# Patient Record
Sex: Male | Born: 1945 | Race: White | Hispanic: Refuse to answer | Marital: Married | Smoking: Former smoker
Health system: Southern US, Community
[De-identification: ages and names within clinical notes are randomized; demographics above are authoritative.]

## PROBLEM LIST (undated history)

## (undated) DIAGNOSIS — I700 Atherosclerosis of aorta: Secondary | ICD-10-CM

## (undated) DIAGNOSIS — M810 Age-related osteoporosis without current pathological fracture: Secondary | ICD-10-CM

## (undated) DIAGNOSIS — R7989 Other specified abnormal findings of blood chemistry: Secondary | ICD-10-CM

## (undated) DIAGNOSIS — R0609 Other forms of dyspnea: Secondary | ICD-10-CM

## (undated) DIAGNOSIS — Z7901 Long term (current) use of anticoagulants: Secondary | ICD-10-CM

## (undated) DIAGNOSIS — Z8679 Personal history of other diseases of the circulatory system: Secondary | ICD-10-CM

## (undated) DIAGNOSIS — R739 Hyperglycemia, unspecified: Secondary | ICD-10-CM

## (undated) DIAGNOSIS — Z8719 Personal history of other diseases of the digestive system: Secondary | ICD-10-CM

## (undated) DIAGNOSIS — I10 Essential (primary) hypertension: Secondary | ICD-10-CM

## (undated) DIAGNOSIS — F419 Anxiety disorder, unspecified: Secondary | ICD-10-CM

## (undated) DIAGNOSIS — E559 Vitamin D deficiency, unspecified: Secondary | ICD-10-CM

## (undated) DIAGNOSIS — H905 Unspecified sensorineural hearing loss: Secondary | ICD-10-CM

## (undated) DIAGNOSIS — Z87898 Personal history of other specified conditions: Secondary | ICD-10-CM

## (undated) DIAGNOSIS — L309 Dermatitis, unspecified: Secondary | ICD-10-CM

## (undated) DIAGNOSIS — J439 Emphysema, unspecified: Secondary | ICD-10-CM

## (undated) DIAGNOSIS — N183 Chronic kidney disease, stage 3 unspecified: Secondary | ICD-10-CM

## (undated) DIAGNOSIS — H3530 Unspecified macular degeneration: Secondary | ICD-10-CM

## (undated) DIAGNOSIS — Z789 Other specified health status: Secondary | ICD-10-CM

## (undated) DIAGNOSIS — D649 Anemia, unspecified: Secondary | ICD-10-CM

## (undated) DIAGNOSIS — I2510 Atherosclerotic heart disease of native coronary artery without angina pectoris: Secondary | ICD-10-CM

## (undated) DIAGNOSIS — M48062 Spinal stenosis, lumbar region with neurogenic claudication: Secondary | ICD-10-CM

## (undated) DIAGNOSIS — K52832 Lymphocytic colitis: Secondary | ICD-10-CM

## (undated) DIAGNOSIS — T7840XA Allergy, unspecified, initial encounter: Secondary | ICD-10-CM

## (undated) DIAGNOSIS — Z87442 Personal history of urinary calculi: Secondary | ICD-10-CM

## (undated) DIAGNOSIS — H5462 Unqualified visual loss, left eye, normal vision right eye: Secondary | ICD-10-CM

## (undated) DIAGNOSIS — R011 Cardiac murmur, unspecified: Secondary | ICD-10-CM

## (undated) DIAGNOSIS — J449 Chronic obstructive pulmonary disease, unspecified: Secondary | ICD-10-CM

## (undated) DIAGNOSIS — Z9889 Other specified postprocedural states: Secondary | ICD-10-CM

## (undated) DIAGNOSIS — N200 Calculus of kidney: Secondary | ICD-10-CM

## (undated) DIAGNOSIS — K649 Unspecified hemorrhoids: Secondary | ICD-10-CM

## (undated) DIAGNOSIS — N401 Enlarged prostate with lower urinary tract symptoms: Secondary | ICD-10-CM

## (undated) DIAGNOSIS — Z8489 Family history of other specified conditions: Secondary | ICD-10-CM

## (undated) DIAGNOSIS — M5030 Other cervical disc degeneration, unspecified cervical region: Secondary | ICD-10-CM

## (undated) DIAGNOSIS — R609 Edema, unspecified: Secondary | ICD-10-CM

## (undated) DIAGNOSIS — Z79899 Other long term (current) drug therapy: Secondary | ICD-10-CM

## (undated) DIAGNOSIS — I4510 Unspecified right bundle-branch block: Secondary | ICD-10-CM

## (undated) DIAGNOSIS — I4891 Unspecified atrial fibrillation: Secondary | ICD-10-CM

## (undated) DIAGNOSIS — H409 Unspecified glaucoma: Secondary | ICD-10-CM

## (undated) DIAGNOSIS — E785 Hyperlipidemia, unspecified: Secondary | ICD-10-CM

## (undated) DIAGNOSIS — I509 Heart failure, unspecified: Secondary | ICD-10-CM

## (undated) DIAGNOSIS — J181 Lobar pneumonia, unspecified organism: Secondary | ICD-10-CM

## (undated) DIAGNOSIS — Z7982 Long term (current) use of aspirin: Secondary | ICD-10-CM

## (undated) DIAGNOSIS — Z860101 Personal history of adenomatous and serrated colon polyps: Secondary | ICD-10-CM

## (undated) DIAGNOSIS — N369 Urethral disorder, unspecified: Secondary | ICD-10-CM

## (undated) DIAGNOSIS — N138 Other obstructive and reflux uropathy: Secondary | ICD-10-CM

## (undated) DIAGNOSIS — H269 Unspecified cataract: Secondary | ICD-10-CM

## (undated) DIAGNOSIS — M5416 Radiculopathy, lumbar region: Secondary | ICD-10-CM

## (undated) DIAGNOSIS — N182 Chronic kidney disease, stage 2 (mild): Secondary | ICD-10-CM

## (undated) DIAGNOSIS — K219 Gastro-esophageal reflux disease without esophagitis: Secondary | ICD-10-CM

## (undated) DIAGNOSIS — Z86010 Personal history of colonic polyps: Secondary | ICD-10-CM

## (undated) DIAGNOSIS — I4892 Unspecified atrial flutter: Principal | ICD-10-CM

## (undated) HISTORY — PX: CERVICAL DISCECTOMY: SHX98

## (undated) HISTORY — DX: Other specified postprocedural states: Z98.890

## (undated) HISTORY — DX: Essential (primary) hypertension: I10

## (undated) HISTORY — DX: Calculus of kidney: N20.0

## (undated) HISTORY — DX: Age-related osteoporosis without current pathological fracture: M81.0

## (undated) HISTORY — DX: Chronic obstructive pulmonary disease, unspecified: J44.9

## (undated) HISTORY — PX: FOOT SURGERY: SHX648

## (undated) HISTORY — PX: KNEE ARTHROSCOPY: SHX127

## (undated) HISTORY — DX: Gastro-esophageal reflux disease without esophagitis: K21.9

## (undated) HISTORY — DX: Unspecified cataract: H26.9

## (undated) HISTORY — PX: APPENDECTOMY: SHX54

## (undated) HISTORY — DX: Anemia, unspecified: D64.9

## (undated) HISTORY — DX: Emphysema, unspecified: J43.9

## (undated) HISTORY — DX: Hyperglycemia, unspecified: R73.9

## (undated) HISTORY — PX: MOUTH SURGERY: SHX715

## (undated) HISTORY — DX: Other long term (current) drug therapy: Z79.899

## (undated) HISTORY — DX: Hyperlipidemia, unspecified: E78.5

## (undated) HISTORY — DX: Unspecified macular degeneration: H35.30

## (undated) HISTORY — PX: JOINT REPLACEMENT: SHX530

## (undated) HISTORY — DX: Allergy, unspecified, initial encounter: T78.40XA

## (undated) HISTORY — DX: Cardiac murmur, unspecified: R01.1

## (undated) HISTORY — DX: Long term (current) use of anticoagulants: Z79.01

## (undated) HISTORY — PX: EYE SURGERY: SHX253

## (undated) HISTORY — DX: Atherosclerotic heart disease of native coronary artery without angina pectoris: I25.10

## (undated) HISTORY — PX: EXTRACORPOREAL SHOCK WAVE LITHOTRIPSY: SHX1557

## (undated) HISTORY — PX: CHOLECYSTECTOMY: SHX55

## (undated) HISTORY — DX: Unspecified atrial flutter: I48.92

## (undated) HISTORY — DX: Personal history of other diseases of the circulatory system: Z86.79

## (undated) HISTORY — DX: Other specified abnormal findings of blood chemistry: R79.89

## (undated) HISTORY — DX: Dermatitis, unspecified: L30.9

## (undated) HISTORY — PX: OTHER SURGICAL HISTORY: SHX169

---
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
## 1968-10-05 HISTORY — PX: FOOT SURGERY: SHX648

## 1989-10-05 HISTORY — PX: KNEE ARTHROSCOPY: SHX127

## 1998-10-05 HISTORY — PX: LAPAROSCOPIC CHOLECYSTECTOMY: SUR755

## 2002-02-04 ENCOUNTER — Emergency Department (HOSPITAL_COMMUNITY): Admission: EM | Admit: 2002-02-04 | Discharge: 2002-02-04 | Payer: Self-pay | Admitting: *Deleted

## 2002-02-14 ENCOUNTER — Encounter: Payer: Self-pay | Admitting: Internal Medicine

## 2002-02-14 ENCOUNTER — Ambulatory Visit (HOSPITAL_COMMUNITY): Admission: RE | Admit: 2002-02-14 | Discharge: 2002-02-14 | Payer: Self-pay | Admitting: Internal Medicine

## 2002-02-24 ENCOUNTER — Encounter: Admission: RE | Admit: 2002-02-24 | Discharge: 2002-04-06 | Payer: Self-pay | Admitting: Internal Medicine

## 2002-06-06 ENCOUNTER — Encounter: Payer: Self-pay | Admitting: Neurosurgery

## 2002-06-08 ENCOUNTER — Observation Stay (HOSPITAL_COMMUNITY): Admission: RE | Admit: 2002-06-08 | Discharge: 2002-06-09 | Payer: Self-pay | Admitting: Neurosurgery

## 2002-06-08 ENCOUNTER — Encounter: Payer: Self-pay | Admitting: Neurosurgery

## 2002-06-08 HISTORY — PX: ANTERIOR CERVICAL DECOMP/DISCECTOMY FUSION: SHX1161

## 2004-05-02 LAB — HM COLONOSCOPY

## 2004-09-25 ENCOUNTER — Ambulatory Visit: Payer: Self-pay | Admitting: Internal Medicine

## 2004-11-24 ENCOUNTER — Ambulatory Visit: Payer: Self-pay | Admitting: Internal Medicine

## 2004-12-03 ENCOUNTER — Ambulatory Visit: Payer: Self-pay | Admitting: Internal Medicine

## 2005-02-02 ENCOUNTER — Ambulatory Visit: Payer: Self-pay | Admitting: Internal Medicine

## 2005-02-09 ENCOUNTER — Ambulatory Visit: Payer: Self-pay | Admitting: Internal Medicine

## 2005-04-16 ENCOUNTER — Ambulatory Visit: Payer: Self-pay | Admitting: Internal Medicine

## 2005-04-23 ENCOUNTER — Ambulatory Visit: Payer: Self-pay | Admitting: Internal Medicine

## 2005-05-01 ENCOUNTER — Ambulatory Visit: Payer: Self-pay

## 2005-07-30 ENCOUNTER — Ambulatory Visit: Payer: Self-pay | Admitting: Internal Medicine

## 2005-08-04 ENCOUNTER — Ambulatory Visit: Admission: RE | Admit: 2005-08-04 | Discharge: 2005-08-04 | Payer: Self-pay | Admitting: Internal Medicine

## 2005-09-01 ENCOUNTER — Ambulatory Visit: Payer: Self-pay | Admitting: Internal Medicine

## 2005-09-17 ENCOUNTER — Ambulatory Visit: Payer: Self-pay | Admitting: Internal Medicine

## 2005-11-25 ENCOUNTER — Ambulatory Visit: Payer: Self-pay | Admitting: Internal Medicine

## 2005-12-16 ENCOUNTER — Ambulatory Visit: Payer: Self-pay | Admitting: Internal Medicine

## 2006-03-15 ENCOUNTER — Ambulatory Visit: Payer: Self-pay | Admitting: Internal Medicine

## 2006-03-22 ENCOUNTER — Ambulatory Visit: Payer: Self-pay | Admitting: Internal Medicine

## 2006-05-24 ENCOUNTER — Ambulatory Visit: Payer: Self-pay | Admitting: Internal Medicine

## 2006-05-25 ENCOUNTER — Ambulatory Visit: Payer: Self-pay | Admitting: Internal Medicine

## 2006-05-31 ENCOUNTER — Ambulatory Visit: Payer: Self-pay | Admitting: Internal Medicine

## 2006-09-06 ENCOUNTER — Ambulatory Visit: Payer: Self-pay | Admitting: Internal Medicine

## 2006-09-06 LAB — CONVERTED CEMR LAB
ALT: 23 units/L (ref 0–40)
AST: 27 units/L (ref 0–37)
Albumin: 3.9 g/dL (ref 3.5–5.2)
Alkaline Phosphatase: 73 units/L (ref 39–117)
Bilirubin, Direct: 0.1 mg/dL (ref 0.0–0.3)
Chol/HDL Ratio, serum: 4.8
Cholesterol: 209 mg/dL (ref 0–200)
HDL: 43.2 mg/dL (ref >39.0)
LDL DIRECT: 164 mg/dL
Total Bilirubin: 1.1 mg/dL (ref 0.3–1.2)
Total Protein: 6.8 g/dL (ref 6.0–8.3)
Triglyceride fasting, serum: 43 mg/dL (ref 0–149)
VLDL: 9 mg/dL (ref 0–40)

## 2006-09-20 ENCOUNTER — Ambulatory Visit: Payer: Self-pay | Admitting: Internal Medicine

## 2006-11-09 ENCOUNTER — Ambulatory Visit: Payer: Self-pay | Admitting: Internal Medicine

## 2006-11-09 LAB — CONVERTED CEMR LAB
ALT: 23 units/L (ref 0–40)
AST: 23 units/L (ref 0–37)
Albumin: 4 g/dL (ref 3.5–5.2)
Alkaline Phosphatase: 64 units/L (ref 39–117)
Bilirubin, Direct: 0.2 mg/dL (ref 0.0–0.3)
Cholesterol: 198 mg/dL (ref 0–200)
HDL: 36 mg/dL — ABNORMAL LOW (ref >39.0)
LDL Cholesterol: 148 mg/dL — ABNORMAL HIGH (ref 0–99)
Total Bilirubin: 0.8 mg/dL (ref 0.3–1.2)
Total CHOL/HDL Ratio: 5.5
Total Protein: 7.1 g/dL (ref 6.0–8.3)
Triglycerides: 71 mg/dL (ref 0–149)
VLDL: 14 mg/dL (ref 0–40)

## 2006-11-17 ENCOUNTER — Ambulatory Visit: Payer: Self-pay | Admitting: Internal Medicine

## 2006-11-17 LAB — CONVERTED CEMR LAB
Anti Nuclear Antibody(ANA): NEGATIVE
CRP, High Sensitivity: 4 (ref 0.00–5.00)
Cyclic Citrullin Peptide Ab: 2 units (ref <20)
Total CK: 82 units/L (ref 7–195)

## 2006-12-22 ENCOUNTER — Ambulatory Visit: Payer: Self-pay | Admitting: Internal Medicine

## 2007-02-02 ENCOUNTER — Ambulatory Visit: Payer: Self-pay | Admitting: Internal Medicine

## 2007-02-23 ENCOUNTER — Ambulatory Visit: Payer: Self-pay | Admitting: Internal Medicine

## 2007-02-26 ENCOUNTER — Encounter: Payer: Self-pay | Admitting: Internal Medicine

## 2007-02-26 ENCOUNTER — Ambulatory Visit: Payer: Self-pay | Admitting: Internal Medicine

## 2007-03-10 ENCOUNTER — Ambulatory Visit: Payer: Self-pay | Admitting: Internal Medicine

## 2007-03-25 ENCOUNTER — Encounter: Payer: Self-pay | Admitting: Internal Medicine

## 2007-06-15 ENCOUNTER — Encounter: Payer: Self-pay | Admitting: Internal Medicine

## 2007-06-16 DIAGNOSIS — K219 Gastro-esophageal reflux disease without esophagitis: Secondary | ICD-10-CM | POA: Insufficient documentation

## 2007-06-20 ENCOUNTER — Ambulatory Visit: Payer: Self-pay | Admitting: Internal Medicine

## 2007-06-20 DIAGNOSIS — M79609 Pain in unspecified limb: Secondary | ICD-10-CM | POA: Insufficient documentation

## 2007-06-20 DIAGNOSIS — N400 Enlarged prostate without lower urinary tract symptoms: Secondary | ICD-10-CM | POA: Insufficient documentation

## 2007-06-20 DIAGNOSIS — E782 Mixed hyperlipidemia: Secondary | ICD-10-CM | POA: Insufficient documentation

## 2007-06-20 DIAGNOSIS — H3530 Unspecified macular degeneration: Secondary | ICD-10-CM | POA: Insufficient documentation

## 2007-06-20 DIAGNOSIS — I10 Essential (primary) hypertension: Secondary | ICD-10-CM | POA: Insufficient documentation

## 2007-06-20 LAB — CONVERTED CEMR LAB
Cholesterol, target level: 200 mg/dL
Cholesterol: 248 mg/dL (ref 0–200)
Direct LDL: 202.5 mg/dL
HDL goal, serum: 40 mg/dL
HDL: 33.6 mg/dL — ABNORMAL LOW (ref >39.0)
LDL Goal: 100 mg/dL
PSA: 0.69 ng/mL (ref 0.10–4.00)
Total CHOL/HDL Ratio: 7.4
Triglycerides: 69 mg/dL (ref 0–149)
VLDL: 14 mg/dL (ref 0–40)

## 2007-09-20 ENCOUNTER — Ambulatory Visit: Payer: Self-pay | Admitting: Internal Medicine

## 2007-10-24 ENCOUNTER — Emergency Department (HOSPITAL_COMMUNITY): Admission: EM | Admit: 2007-10-24 | Discharge: 2007-10-24 | Payer: Self-pay | Admitting: Emergency Medicine

## 2007-11-07 ENCOUNTER — Ambulatory Visit: Payer: Self-pay | Admitting: Internal Medicine

## 2007-11-07 DIAGNOSIS — L218 Other seborrheic dermatitis: Secondary | ICD-10-CM | POA: Insufficient documentation

## 2007-11-07 DIAGNOSIS — S335XXA Sprain of ligaments of lumbar spine, initial encounter: Secondary | ICD-10-CM | POA: Insufficient documentation

## 2007-11-21 ENCOUNTER — Emergency Department (HOSPITAL_COMMUNITY): Admission: EM | Admit: 2007-11-21 | Discharge: 2007-11-21 | Payer: Self-pay | Admitting: Emergency Medicine

## 2007-11-22 ENCOUNTER — Telehealth: Payer: Self-pay | Admitting: Internal Medicine

## 2007-11-25 ENCOUNTER — Telehealth: Payer: Self-pay | Admitting: Internal Medicine

## 2007-11-28 ENCOUNTER — Telehealth (INDEPENDENT_AMBULATORY_CARE_PROVIDER_SITE_OTHER): Payer: Self-pay | Admitting: *Deleted

## 2007-11-30 ENCOUNTER — Encounter: Payer: Self-pay | Admitting: Internal Medicine

## 2007-12-02 ENCOUNTER — Encounter: Payer: Self-pay | Admitting: Internal Medicine

## 2008-03-05 ENCOUNTER — Ambulatory Visit: Payer: Self-pay | Admitting: Internal Medicine

## 2008-03-05 LAB — CONVERTED CEMR LAB
ALT: 20 units/L (ref 0–53)
AST: 23 units/L (ref 0–37)
Albumin: 3.8 g/dL (ref 3.5–5.2)
Alkaline Phosphatase: 64 units/L (ref 39–117)
BUN: 20 mg/dL (ref 6–23)
Basophils Absolute: 0 10*3/uL (ref 0.0–0.1)
Basophils Relative: 0.1 % (ref 0.0–1.0)
Bilirubin Urine: NEGATIVE
Bilirubin, Direct: 0.1 mg/dL (ref 0.0–0.3)
Blood in Urine, dipstick: NEGATIVE
CO2: 28 meq/L (ref 19–32)
Calcium: 9.5 mg/dL (ref 8.4–10.5)
Chloride: 104 meq/L (ref 96–112)
Cholesterol: 231 mg/dL (ref 0–200)
Creatinine, Ser: 1 mg/dL (ref 0.4–1.5)
Direct LDL: 178.7 mg/dL
Eosinophils Absolute: 0.1 10*3/uL (ref 0.0–0.7)
Eosinophils Relative: 0.8 % (ref 0.0–5.0)
GFR calc Af Amer: 98 mL/min
GFR calc non Af Amer: 81 mL/min
Glucose, Bld: 77 mg/dL (ref 70–99)
Glucose, Urine, Semiquant: NEGATIVE
HCT: 45.4 % (ref 39.0–52.0)
HDL: 34.3 mg/dL — ABNORMAL LOW (ref >39.0)
Hemoglobin: 16 g/dL (ref 13.0–17.0)
Lymphocytes Relative: 18.5 % (ref 12.0–46.0)
MCHC: 35.2 g/dL (ref 30.0–36.0)
MCV: 95 fL (ref 78.0–100.0)
Monocytes Absolute: 0.6 10*3/uL (ref 0.1–1.0)
Monocytes Relative: 5.6 % (ref 3.0–12.0)
Neutro Abs: 8 10*3/uL — ABNORMAL HIGH (ref 1.4–7.7)
Neutrophils Relative %: 75 % (ref 43.0–77.0)
Nitrite: NEGATIVE
PSA: 0.73 ng/mL (ref 0.10–4.00)
Platelets: 195 10*3/uL (ref 150–400)
Potassium: 5.1 meq/L (ref 3.5–5.1)
Protein, U semiquant: NEGATIVE
RBC: 4.78 M/uL (ref 4.22–5.81)
RDW: 12 % (ref 11.5–14.6)
Sodium: 138 meq/L (ref 135–145)
Specific Gravity, Urine: 1.025
TSH: 1.22 microintl units/mL (ref 0.35–5.50)
Total Bilirubin: 0.8 mg/dL (ref 0.3–1.2)
Total CHOL/HDL Ratio: 6.7
Total Protein: 7.1 g/dL (ref 6.0–8.3)
Triglycerides: 48 mg/dL (ref 0–149)
Urobilinogen, UA: 0.2
VLDL: 10 mg/dL (ref 0–40)
WBC Urine, dipstick: NEGATIVE
WBC: 10.7 10*3/uL — ABNORMAL HIGH (ref 4.5–10.5)
pH: 5

## 2008-03-12 ENCOUNTER — Ambulatory Visit: Payer: Self-pay | Admitting: Internal Medicine

## 2008-03-12 DIAGNOSIS — J320 Chronic maxillary sinusitis: Secondary | ICD-10-CM | POA: Insufficient documentation

## 2008-04-13 ENCOUNTER — Encounter: Payer: Self-pay | Admitting: Internal Medicine

## 2008-04-27 ENCOUNTER — Encounter: Payer: Self-pay | Admitting: Internal Medicine

## 2008-06-28 ENCOUNTER — Encounter: Payer: Self-pay | Admitting: Internal Medicine

## 2008-09-04 ENCOUNTER — Ambulatory Visit: Payer: Self-pay | Admitting: Internal Medicine

## 2008-09-04 LAB — CONVERTED CEMR LAB
Cholesterol: 248 mg/dL (ref 0–200)
Direct LDL: 175.1 mg/dL
HDL: 41 mg/dL (ref >39.0)
Total CHOL/HDL Ratio: 6
Triglycerides: 70 mg/dL (ref 0–149)
VLDL: 14 mg/dL (ref 0–40)

## 2009-03-15 ENCOUNTER — Ambulatory Visit: Payer: Self-pay | Admitting: Internal Medicine

## 2009-03-15 LAB — CONVERTED CEMR LAB
ALT: 19 units/L (ref 0–53)
AST: 21 units/L (ref 0–37)
Albumin: 3.8 g/dL (ref 3.5–5.2)
Alkaline Phosphatase: 72 units/L (ref 39–117)
BUN: 17 mg/dL (ref 6–23)
Basophils Absolute: 0 10*3/uL (ref 0.0–0.1)
Basophils Relative: 0.1 % (ref 0.0–3.0)
Bilirubin Urine: NEGATIVE
Bilirubin, Direct: 0 mg/dL (ref 0.0–0.3)
Blood in Urine, dipstick: NEGATIVE
CO2: 28 meq/L (ref 19–32)
Calcium: 9.3 mg/dL (ref 8.4–10.5)
Chloride: 106 meq/L (ref 96–112)
Cholesterol: 215 mg/dL — ABNORMAL HIGH (ref 0–200)
Creatinine, Ser: 0.9 mg/dL (ref 0.4–1.5)
Direct LDL: 168.5 mg/dL
Eosinophils Absolute: 0.1 10*3/uL (ref 0.0–0.7)
Eosinophils Relative: 1.2 % (ref 0.0–5.0)
GFR calc non Af Amer: 90.83 mL/min (ref >60)
Glucose, Bld: 81 mg/dL (ref 70–99)
Glucose, Urine, Semiquant: NEGATIVE
HCT: 43.8 % (ref 39.0–52.0)
HDL: 36.9 mg/dL — ABNORMAL LOW (ref >39.00)
Hemoglobin: 15.6 g/dL (ref 13.0–17.0)
Ketones, urine, test strip: NEGATIVE
Lymphocytes Relative: 22.5 % (ref 12.0–46.0)
Lymphs Abs: 1.8 10*3/uL (ref 0.7–4.0)
MCHC: 35.6 g/dL (ref 30.0–36.0)
MCV: 92.7 fL (ref 78.0–100.0)
Monocytes Absolute: 0.5 10*3/uL (ref 0.1–1.0)
Monocytes Relative: 5.8 % (ref 3.0–12.0)
Neutro Abs: 5.4 10*3/uL (ref 1.4–7.7)
Neutrophils Relative %: 70.4 % (ref 43.0–77.0)
Nitrite: NEGATIVE
PSA: 0.73 ng/mL (ref 0.10–4.00)
Platelets: 173 10*3/uL (ref 150.0–400.0)
Potassium: 4.6 meq/L (ref 3.5–5.1)
Protein, U semiquant: NEGATIVE
RBC: 4.73 M/uL (ref 4.22–5.81)
RDW: 12 % (ref 11.5–14.6)
Sodium: 137 meq/L (ref 135–145)
Specific Gravity, Urine: 1.01
TSH: 1.12 microintl units/mL (ref 0.35–5.50)
Total Bilirubin: 1.2 mg/dL (ref 0.3–1.2)
Total CHOL/HDL Ratio: 6
Total Protein: 7.4 g/dL (ref 6.0–8.3)
Triglycerides: 55 mg/dL (ref 0.0–149.0)
Urobilinogen, UA: 0.2
VLDL: 11 mg/dL (ref 0.0–40.0)
WBC Urine, dipstick: NEGATIVE
WBC: 7.8 10*3/uL (ref 4.5–10.5)
pH: 5.5

## 2009-03-27 ENCOUNTER — Ambulatory Visit: Payer: Self-pay | Admitting: Internal Medicine

## 2009-04-22 ENCOUNTER — Encounter: Payer: Self-pay | Admitting: Internal Medicine

## 2009-07-18 ENCOUNTER — Ambulatory Visit: Payer: Self-pay | Admitting: Internal Medicine

## 2009-07-18 LAB — CONVERTED CEMR LAB
ALT: 35 units/L (ref 0–53)
AST: 34 units/L (ref 0–37)
Albumin: 3.7 g/dL (ref 3.5–5.2)
Alkaline Phosphatase: 75 units/L (ref 39–117)
Bilirubin, Direct: 0.1 mg/dL (ref 0.0–0.3)
Cholesterol: 199 mg/dL (ref 0–200)
HDL: 27.9 mg/dL — ABNORMAL LOW (ref >39.00)
LDL Cholesterol: 154 mg/dL — ABNORMAL HIGH (ref 0–99)
Total Bilirubin: 0.9 mg/dL (ref 0.3–1.2)
Total CHOL/HDL Ratio: 7
Total Protein: 7.5 g/dL (ref 6.0–8.3)
Triglycerides: 84 mg/dL (ref 0.0–149.0)
VLDL: 16.8 mg/dL (ref 0.0–40.0)

## 2009-07-29 ENCOUNTER — Ambulatory Visit: Payer: Self-pay | Admitting: Internal Medicine

## 2009-07-29 DIAGNOSIS — J449 Chronic obstructive pulmonary disease, unspecified: Secondary | ICD-10-CM | POA: Insufficient documentation

## 2009-07-29 DIAGNOSIS — Z87891 Personal history of nicotine dependence: Secondary | ICD-10-CM | POA: Insufficient documentation

## 2009-08-14 ENCOUNTER — Inpatient Hospital Stay (HOSPITAL_COMMUNITY): Admission: RE | Admit: 2009-08-14 | Discharge: 2009-08-17 | Payer: Self-pay | Admitting: Orthopedic Surgery

## 2009-08-14 HISTORY — PX: TOTAL KNEE ARTHROPLASTY: SHX125

## 2009-11-07 ENCOUNTER — Encounter: Payer: Self-pay | Admitting: Internal Medicine

## 2009-11-11 ENCOUNTER — Ambulatory Visit: Payer: Self-pay | Admitting: Internal Medicine

## 2009-11-11 DIAGNOSIS — T887XXA Unspecified adverse effect of drug or medicament, initial encounter: Secondary | ICD-10-CM | POA: Insufficient documentation

## 2009-11-11 LAB — CONVERTED CEMR LAB
ALT: 17 units/L (ref 0–53)
AST: 18 units/L (ref 0–37)
Albumin: 3.9 g/dL (ref 3.5–5.2)
Alkaline Phosphatase: 70 units/L (ref 39–117)
Basophils Absolute: 0 10*3/uL (ref 0.0–0.1)
Basophils Relative: 0.2 % (ref 0.0–3.0)
Bilirubin, Direct: 0 mg/dL (ref 0.0–0.3)
Eosinophils Absolute: 0 10*3/uL (ref 0.0–0.7)
Eosinophils Relative: 0.5 % (ref 0.0–5.0)
HCT: 41.6 % (ref 39.0–52.0)
Hemoglobin: 13.8 g/dL (ref 13.0–17.0)
Lymphocytes Relative: 23.5 % (ref 12.0–46.0)
Lymphs Abs: 2 10*3/uL (ref 0.7–4.0)
MCHC: 33.2 g/dL (ref 30.0–36.0)
MCV: 93.7 fL (ref 78.0–100.0)
Monocytes Absolute: 0.5 10*3/uL (ref 0.1–1.0)
Monocytes Relative: 6 % (ref 3.0–12.0)
Neutro Abs: 6 10*3/uL (ref 1.4–7.7)
Neutrophils Relative %: 69.8 % (ref 43.0–77.0)
Platelets: 173 10*3/uL (ref 150.0–400.0)
RBC: 4.44 M/uL (ref 4.22–5.81)
RDW: 12.1 % (ref 11.5–14.6)
Total Bilirubin: 0.4 mg/dL (ref 0.3–1.2)
Total Protein: 7.5 g/dL (ref 6.0–8.3)
WBC: 8.5 10*3/uL (ref 4.5–10.5)

## 2009-11-13 ENCOUNTER — Telehealth: Payer: Self-pay | Admitting: Internal Medicine

## 2009-11-18 ENCOUNTER — Encounter: Payer: Self-pay | Admitting: Internal Medicine

## 2009-12-09 ENCOUNTER — Ambulatory Visit: Payer: Self-pay | Admitting: Internal Medicine

## 2010-01-06 ENCOUNTER — Ambulatory Visit: Payer: Self-pay | Admitting: Internal Medicine

## 2010-01-20 ENCOUNTER — Telehealth: Payer: Self-pay | Admitting: Internal Medicine

## 2010-01-30 ENCOUNTER — Encounter: Payer: Self-pay | Admitting: Internal Medicine

## 2010-02-14 ENCOUNTER — Encounter: Payer: Self-pay | Admitting: Internal Medicine

## 2010-03-10 ENCOUNTER — Ambulatory Visit: Payer: Self-pay | Admitting: Internal Medicine

## 2010-04-23 ENCOUNTER — Telehealth: Payer: Self-pay | Admitting: Internal Medicine

## 2010-05-21 ENCOUNTER — Telehealth: Payer: Self-pay | Admitting: Internal Medicine

## 2010-05-30 ENCOUNTER — Encounter: Payer: Self-pay | Admitting: Internal Medicine

## 2010-06-20 ENCOUNTER — Ambulatory Visit: Payer: Self-pay | Admitting: Internal Medicine

## 2010-06-20 DIAGNOSIS — F4321 Adjustment disorder with depressed mood: Secondary | ICD-10-CM | POA: Insufficient documentation

## 2010-06-20 DIAGNOSIS — F911 Conduct disorder, childhood-onset type: Secondary | ICD-10-CM | POA: Insufficient documentation

## 2010-06-20 LAB — CONVERTED CEMR LAB
CRP, High Sensitivity: 2.18 (ref 0.00–5.00)
Sed Rate: 13 mm/hr (ref 0–22)

## 2010-06-27 ENCOUNTER — Telehealth: Payer: Self-pay | Admitting: Internal Medicine

## 2010-06-30 ENCOUNTER — Emergency Department (HOSPITAL_COMMUNITY): Admission: EM | Admit: 2010-06-30 | Discharge: 2010-06-30 | Payer: Self-pay | Admitting: Emergency Medicine

## 2010-06-30 ENCOUNTER — Telehealth: Payer: Self-pay | Admitting: Internal Medicine

## 2010-07-04 ENCOUNTER — Ambulatory Visit: Payer: Self-pay | Admitting: Internal Medicine

## 2010-07-25 ENCOUNTER — Encounter: Payer: Self-pay | Admitting: Internal Medicine

## 2010-08-21 ENCOUNTER — Ambulatory Visit: Payer: Self-pay | Admitting: Internal Medicine

## 2010-08-21 DIAGNOSIS — IMO0002 Reserved for concepts with insufficient information to code with codable children: Secondary | ICD-10-CM | POA: Insufficient documentation

## 2010-09-01 ENCOUNTER — Encounter: Admission: RE | Admit: 2010-09-01 | Discharge: 2010-09-01 | Payer: Self-pay | Admitting: Internal Medicine

## 2010-09-04 ENCOUNTER — Telehealth: Payer: Self-pay | Admitting: Internal Medicine

## 2010-09-05 ENCOUNTER — Telehealth: Payer: Self-pay | Admitting: Internal Medicine

## 2010-09-11 ENCOUNTER — Encounter: Payer: Self-pay | Admitting: Internal Medicine

## 2010-09-17 ENCOUNTER — Encounter: Payer: Self-pay | Admitting: Internal Medicine

## 2010-10-29 ENCOUNTER — Ambulatory Visit
Admission: RE | Admit: 2010-10-29 | Discharge: 2010-10-29 | Payer: Self-pay | Source: Home / Self Care | Attending: Internal Medicine | Admitting: Internal Medicine

## 2010-11-02 LAB — CONVERTED CEMR LAB
ALT: 27 units/L (ref 0–40)
AST: 28 units/L (ref 0–37)
Albumin: 4 g/dL (ref 3.5–5.2)
Alkaline Phosphatase: 77 units/L (ref 39–117)
BUN: 11 mg/dL (ref 6–23)
Basophils Absolute: 0.1 10*3/uL (ref 0.0–0.1)
Basophils Relative: 0.9 % (ref 0.0–1.0)
Bilirubin, Direct: 0.1 mg/dL (ref 0.0–0.3)
CO2: 30 meq/L (ref 19–32)
CRP, High Sensitivity: 4 (ref 0.00–5.00)
Calcium: 9.9 mg/dL (ref 8.4–10.5)
Chloride: 106 meq/L (ref 96–112)
Cholesterol: 255 mg/dL (ref 0–200)
Creatinine, Ser: 1 mg/dL (ref 0.4–1.5)
Direct LDL: 178.7 mg/dL
Eosinophils Absolute: 0.1 10*3/uL (ref 0.0–0.6)
Eosinophils Relative: 1.3 % (ref 0.0–5.0)
GFR calc Af Amer: 98 mL/min
GFR calc non Af Amer: 81 mL/min
Glucose, Bld: 96 mg/dL (ref 70–99)
HCT: 44.9 % (ref 39.0–52.0)
HDL: 38.2 mg/dL — ABNORMAL LOW (ref >39.0)
Hemoglobin: 15.6 g/dL (ref 13.0–17.0)
Lymphocytes Relative: 31.6 % (ref 12.0–46.0)
MCHC: 34.8 g/dL (ref 30.0–36.0)
MCV: 92.7 fL (ref 78.0–100.0)
Monocytes Absolute: 0.5 10*3/uL (ref 0.2–0.7)
Monocytes Relative: 7.2 % (ref 3.0–11.0)
Neutro Abs: 4.2 10*3/uL (ref 1.4–7.7)
Neutrophils Relative %: 59 % (ref 43.0–77.0)
Platelets: 196 10*3/uL (ref 150–400)
Potassium: 4.7 meq/L (ref 3.5–5.1)
RBC: 4.84 M/uL (ref 4.22–5.81)
RDW: 11.9 % (ref 11.5–14.6)
Sodium: 143 meq/L (ref 135–145)
TSH: 0.99 microintl units/mL (ref 0.35–5.50)
Total Bilirubin: 1.1 mg/dL (ref 0.3–1.2)
Total CHOL/HDL Ratio: 6.7
Total Protein: 7.6 g/dL (ref 6.0–8.3)
Triglycerides: 81 mg/dL (ref 0–149)
VLDL: 16 mg/dL (ref 0–40)
WBC: 7.2 10*3/uL (ref 4.5–10.5)

## 2010-11-06 NOTE — Progress Notes (Signed)
Summary: H&P/[facility]  H&P/[facility]   Imported By: Sherian Rein 03/21/2010 15:15:51  _____________________________________________________________________  External Attachment:    Type:   Image     Comment:   External Document

## 2010-11-06 NOTE — Assessment & Plan Note (Signed)
Summary: cpx/njr rsc with pt/mhf   Vital Signs:  Patient profile:   65 year old male Height:      66 inches Weight:      166 pounds BMI:     26.89 Temp:     98.2 degrees F oral Pulse rate:   76 / minute Resp:     14 per minute BP sitting:   124 / 84  (left arm)  Vitals Entered By: Willy Eddy, LPN (March 27, 2009 10:53 AM)  CC:  cpx.  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 Take 1 Tablet By Mouth Once A Day 2)  Glucosamine Sulfate 500 Mg Tabs (Glucosamine Sulfate) .... Take 1 Once A Day 3)  Metanx 2.8-25-2 Mg Tabs (L-Methylfolate-B6-B12) .... Take 1 Once A Day 4)  Prilosec 20 Mg Cpdr (Omeprazole) .... Take 1 Capsule By Mouth Once A Day 5)  Icaps Areds Formula   Tabs (Multiple Vitamins-Minerals) .... 2 Once Daily 6)  Bl Flax Seed Oil 1000 Mg  Caps (Flaxseed (Linseed)) .... Once Daily 7)  Chantix Starting Month Pak 0.5 Mg X 11 & 1 Mg X 42  Misc (Varenicline Tartrate) .... As Directed-Not Started 8)  Clobetasol Propionate 0.05 %  Soln (Clobetasol Propionate) .... Apply To Scalp Daily As Needed 9)  Krill Oil .Marland Kitchen.. 1 Once  Daily  Current Medications (verified): 1)  Aspir-81 81 Mg Tbec (Aspirin) .... Take 1 Tablet By Mouth Once A Day 2)  Glucosamine Sulfate 500 Mg Tabs (Glucosamine Sulfate) .... Take 1 Once A Day 3)  Metanx 2.8-25-2 Mg Tabs (L-Methylfolate-B6-B12) .... Take 1 Once A Day 4)  Prilosec 20 Mg Cpdr (Omeprazole) .... Take 1 Capsule By Mouth Once A Day 5)  Icaps Areds Formula   Tabs (Multiple Vitamins-Minerals) .... 2 Once Daily 6)  Bl Flax Seed Oil 1000 Mg  Caps (Flaxseed (Linseed)) .... Once Daily 7)  Clobetasol Propionate 0.05 %  Soln (Clobetasol Propionate) .... Apply To Scalp Daily As Needed  Allergies (verified): 1)  * Vioxx (Rofecoxib) 2)  Cipro 3)  Codeine Phosphate (Codeine Phosphate) 4)  Lipitor (Atorvastatin Calcium) 5)  Simvastatin (Simvastatin)  Past History:  Family History: Last updated: 06/16/2007 Family History Diabetes 1st degree relative Family History Liver disease Family History of Prostate CA 1st degree relative <50 Family History Weight disorder Family History of Cardiovascular disorder  Social History: Last updated: 06/16/2007 Retired Married Current Smoker Alcohol use-yes  Risk Factors: Smoking Status: current (06/16/2007)  Past medical, surgical, family and social histories (including risk factors) reviewed, and no changes noted (except as noted below).  Past Medical History: Reviewed history from 06/20/2007 and no changes required. Rheumatic  Fever, hx of Renal Stones GERD Heart Murmur Anemia-NOS Hypertension macular degeneration ( [place]) Hyperlipidemia  Past Surgical History: Reviewed history from 06/16/2007 and no changes required. Cervical Discectomy Cholecystectomy Oral Surgery Knee Cartiledge Surgery Foot Surgery Appendectomy  Family History: Reviewed history from 06/16/2007 and no changes required. Family History Diabetes 1st degree relative Family History Liver disease Family History of Prostate CA 1st degree relative <50 Family  History Weight disorder Family History of Cardiovascular disorder  Social History: Reviewed history from 06/16/2007 and no changes required. Retired Married Current Smoker Alcohol use-yes  Review of Systems  The patient denies anorexia, fever, weight loss, weight gain, vision loss, decreased hearing, hoarseness, chest pain, syncope, dyspnea on exertion, peripheral edema, prolonged cough, headaches, hemoptysis, abdominal pain, melena, hematochezia, severe indigestion/heartburn, hematuria, incontinence, genital sores, muscle weakness, suspicious skin lesions, transient blindness, difficulty walking, depression, unusual weight change, abnormal bleeding, enlarged lymph nodes, angioedema, and breast masses.    Physical Exam  General:  Well-developed,well-nourished,in no acute distress; alert,appropriate and cooperative throughout examination Head:  normocephalic, atraumatic, and male-pattern balding.   Ears:  R cerumen impaction and L Cerumen impaction.   Mouth:  pharynx pink and moist and no posterior lymphoid hypertrophy.   Neck:  No deformities, masses, or tenderness noted. Lungs:  Normal respiratory effort, chest expands symmetrically. Lungs are clear to auscultation, no crackles or wheezes. Heart:  Normal rate and regular rhythm. S1 and S2 normal without gallop, murmur, click, rub or other extra sounds. Abdomen:  Bowel sounds positive,abdomen soft and non-tender without masses, organomegaly or hernias noted. Rectal:  no external abnormalities.     Impression & Recommendations:  Problem # 1:  PHYSICAL EXAMINATION (ICD-V70.0) reveiwed the hx, meds and protocols Colonoscopy: abnormal (05/02/2004) Td Booster: Historical (10/05/2002)   Flu Vax: Historical (10/05/2000)   Chol: 215 (03/15/2009)   HDL: 36.90 (03/15/2009)   LDL: DEL (09/04/2008)   TG: 55.0 (03/15/2009) TSH: 1.12 (03/15/2009)   PSA: 0.73 (03/15/2009) Next Colonoscopy due:: 05/2009 (03/27/2009)  Discussed using sunscreen,  use of alcohol, drug use, self testicular exam, routine dental care, routine eye care, routine physical exam, seat belts, multiple vitamins, osteoporosis prevention, adequate calcium intake in diet, and recommendations for immunizations.  Discussed exercise and checking cholesterol.  Discussed gun safety, safe sex, and contraception. Also recommend checking PSA.  Complete Medication List: 1)  Aspir-81 81 Mg Tbec (Aspirin) .... Take 1 tablet by mouth once a day 2)  Glucosamine Sulfate 500 Mg Tabs (Glucosamine sulfate) .... Take 1 once a day 3)  Metanx 2.8-25-2 Mg Tabs (L-methylfolate-b6-b12) .... Take 1 once a day 4)  Prilosec 20 Mg Cpdr (Omeprazole) .... Take 1 capsule by mouth once a day 5)  Icaps Areds Formula Tabs (Multiple vitamins-minerals) .... 2 once daily 6)  Bl Flax Seed Oil 1000 Mg Caps (Flaxseed (linseed)) .... Once daily 7)  Clobetasol Propionate 0.05 % Soln (Clobetasol propionate) .... Apply to scalp daily as needed  Patient Instructions: 1)  Please schedule a follow-up appointment in 4 months. 2)  stop smoking!!!!!!!!!!!!!!!!! 3)  Hepatic Panel prior to visit, ICD-9:995.20 4)  Lipid Panel prior to visit, ICD-9:272.4     Preventive Care Screening  Colonoscopy:    Date:  05/02/2004    Next Due:  05/2009    Results:  abnormal   Appended Document: Orders Update     Clinical Lists Changes  Orders: Added new Service order of Zoster (Shingles) Vaccine Live [phone]) - Signed Added new Service order of Admin 1st Vaccine ([id number]) - Signed Observations:  Added new observation of ZOSTAVAX LOT: 1765y (03/27/2009 11:52) Added new observation of ZOSTAVAX EXP: 12/17/2009 (03/27/2009 11:52) Added new observation of ZOSTAVAX BY: Willy Eddy, LPN (16/07/9603 11:52) Added new observation of ZOSTAVAX RTE: [place] (03/27/2009 11:52) Added new observation of ZOSTAVAXDOSE: 0.5 ml (03/27/2009 11:52) Added new observation of ZOSTAVAX MFR: Merck (03/27/2009 11:52) Added new observation  of ZOSTAVAXSITE: left deltoid (03/27/2009 11:52) Added new observation of ZOSTAVAX: Zostavax (03/27/2009 11:52)       Immunizations Administered:  Zostavax # 1:    Vaccine Type: Zostavax    Site: left deltoid    Mfr: Merck    Dose: 0.5 ml    Route: [place]    Given by: Willy Eddy, LPN    Exp. Date: 12/17/2009    Lot #: [id number]

## 2010-11-06 NOTE — Letter (Signed)
Summary: [facility]  [facility]   Imported By: Kassie Mends 06/28/2007 09:07:59  _____________________________________________________________________  External Attachment:    Type:   Image     Comment:   [facility] note

## 2010-11-06 NOTE — Letter (Signed)
Summary: [facility]  [facility]   Imported By: Sherian Rein 06/11/2010 11:49:01  _____________________________________________________________________  External Attachment:    Type:   Image     Comment:   External Document

## 2010-11-06 NOTE — Progress Notes (Signed)
Summary: med not available  Phone Note Call from Patient Call back at [phone]   Caller: Patient Call For: Stacie Glaze MD Reason for Call: Acute Illness Summary of Call: triven-cf-nac has been discontinue per pt. please call Gerome Apley [phone] Initial call taken by: Heron Sabins,  May 21, 2010 9:12 AM  Follow-up for Phone Call        [facility] generic that was discussed is not available or on back order-- nothing to suggest--maybe the pharmacist cou suggest otc b's to take that equal Follow-up by: Willy Eddy, LPN,  May 21, 2010 9:21 AM

## 2010-11-06 NOTE — Assessment & Plan Note (Signed)
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  History of Present Illness: persitant heal pain and depression over the recovery from the knee surgery pain is  behind the knee as well as in the heal on the right knee the pt is emontional about the pain there was an MRI of ankle and foot  EMG and NCV by ramos for nerve ( he had quit smoking but resumed due to stress) mechanically the knee is working well  he is fearfull of pain meds lipid meds have caused pain in the past blood pressure is stable  Preventive Screening-Counseling & Management  Alcohol-Tobacco     Smoking Status: current  Current Problems (verified): 1)  Smoker  (ICD-305.1) 2)  Maxillary Sinusitis  (ICD-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 Take 1 Tablet By Mouth Once A Day 2)  Glucosamine Sulfate 500 Mg Tabs (Glucosamine Sulfate) .... Take 1 Once A Day 3)  Neurpath-B 3-35-2 Mg Tabs (L-Methylfolate-B6-B12) .Marland Kitchen.. 1 Once Daily 4)  Prilosec 20  Mg Cpdr (Omeprazole) .... Take 1 Capsule By Mouth Once A Day 5)  Icaps Areds Formula   Tabs (Multiple Vitamins-Minerals) .... 2 Once Daily 6)  Bl Flax Seed Oil 1000 Mg  Caps (Flaxseed (Linseed)) .... Once Daily 7)  Clobetasol Propionate 0.05 %  Soln (Clobetasol Propionate) .... Apply To Scalp Daily As Needed  Allergies: 1)  * Vioxx (Rofecoxib) 2)  Cipro 3)  Codeine Phosphate (Codeine Phosphate) 4)  Lipitor (Atorvastatin Calcium) 5)  Simvastatin (Simvastatin)  Past History:  Family History: Last updated: 06/16/2007 Family History Diabetes 1st degree relative Family History Liver disease Family History of Prostate CA 1st degree relative <50 Family History Weight disorder Family History of Cardiovascular disorder  Social History: Last updated: 06/16/2007 Retired Married Current Smoker Alcohol use-yes  Risk Factors: Smoking Status: current (11/11/2009)  Past medical, surgical, family and social histories (including risk factors) reviewed, and no changes noted (except as noted below).  Past Medical History: Reviewed history from 06/20/2007 and no changes required. Rheumatic Fever, hx of Renal Stones GERD Heart Murmur Anemia-NOS Hypertension macular degeneration ( [place]) Hyperlipidemia  Past Surgical History: Reviewed history from 06/16/2007 and no changes required. Cervical Discectomy Cholecystectomy Oral Surgery Knee Cartiledge Surgery Foot Surgery Appendectomy  Family History: Reviewed history from 06/16/2007 and no changes required. Family History Diabetes 1st degree relative Family History Liver disease  Family History of Prostate CA 1st degree relative <50 Family History Weight disorder Family History of Cardiovascular disorder  Social History: Reviewed history from 06/16/2007 and no changes required. Retired Married Current Smoker Alcohol use-yes  Review of Systems  The patient denies anorexia, fever, weight loss, weight gain, vision loss,  decreased hearing, hoarseness, chest pain, syncope, dyspnea on exertion, peripheral edema, prolonged cough, headaches, hemoptysis, abdominal pain, melena, hematochezia, severe indigestion/heartburn, hematuria, incontinence, genital sores, muscle weakness, suspicious skin lesions, transient blindness, difficulty walking, depression, unusual weight change, abnormal bleeding, enlarged lymph nodes, angioedema, and breast masses.    Physical Exam  General:  Well-developed,well-nourished,in no acute distress; alert,appropriate and cooperative throughout examination Head:  normocephalic and atraumatic.   Eyes:  pupils equal and pupils round.   Ears:  R ear normal and L ear normal.   Nose:  no external deformity and no nasal discharge.   Mouth:  pharynx pink and moist and no erythema.   Neck:  No deformities, masses, or tenderness noted. Lungs:  normal respiratory effort and no accessory muscle use.   Heart:  normal rate and regular rhythm.   Msk:  decreased ROM, joint tenderness, and joint swelling.   Neurologic:  alert & oriented X3 and abnormal gait.     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   Problem # 5:  HYPERLIPIDEMIA (ICD-272.4)  Labs  Reviewed: SGOT: 34 (07/18/2009)   SGPT: 35 (07/18/2009)  Lipid Goals: Chol Goal: 200 (06/20/2007)   HDL Goal: 40 (06/20/2007)   LDL Goal: 100 (06/20/2007)   TG Goal: 150 (06/20/2007)  Prior 10 Yr Risk Heart Disease: 33 % (09/04/2008)   HDL:27.90 (07/18/2009), 36.90 (03/15/2009)  LDL:154 (07/18/2009), DEL (09/04/2008)  Chol:199 (07/18/2009), 215 (03/15/2009)  Trig:84.0 (07/18/2009), 55.0 (03/15/2009)  Problem # 6:  HYPERTENSION (ICD-401.9)  Complete Medication List: 1)  Aspir-81 81 Mg Tbec (Aspirin) .... Take 1 tablet by mouth once a day 2)  Glucosamine Sulfate 500 Mg Tabs (Glucosamine sulfate) .... Take 1 once a day 3)  Neurpath-b 3-35-2 Mg Tabs (L-methylfolate-b6-b12) .... One by mouth two times a day 4)  Prilosec 20 Mg Cpdr (Omeprazole) .... Take 1 capsule by mouth once a day 5)  Icaps Areds Formula Tabs (Multiple vitamins-minerals) .... 2 once daily 6)  Bl Flax Seed Oil 1000 Mg Caps (Flaxseed (linseed)) .... Once daily 7)  Clobetasol Propionate 0.05 % Soln (Clobetasol propionate) .... Apply to scalp daily as needed 8)  Vicodin 5-500 Mg Tabs (Hydrocodone-acetaminophen) .... Post surgery- 1 every 6-8 hours prn 9)  Cymbalta 30 Mg Cpep (Duloxetine hcl) .... One by mouth daily  Other Orders: TLB-Hepatic/Liver Function Pnl ([id number]-[id number])  Patient Instructions: 1)  My best guess is that this represent nerve damage 2)  you are displaying depression over the pain and the limitation 3)  high dose vitamin b12 and b6 4)  cymbalta low dose for mood and to mask some of the pain 5)  ( pain memory is as strong as pain!) 6)  judicious US of the vicodin 7)  moniter liver today. Prescriptions: NEURPATH-B 3-35-2 MG TABS (L-METHYLFOLATE-B6-B12) one by mouth two times a day  #60 x 11   Entered and Authorized by:   Stacie Glaze MD   Signed by:   Stacie Glaze MD on 11/11/2009   Method used:   Electronically to        [facility]  [phone] (retail)       [address]        [place], [place]  [address]       Ph: [phone] or [phone]       Fax: [phone]   RxID:   [id number] METANX 3-35-2 MG TABS (L-METHYLFOLATE-B6-B12) increase to two times a day  #60 x 11   Entered and Authorized by:   Stacie Glaze MD   Signed by:   Stacie Glaze MD on 11/11/2009   Method used:   Electronically to        [facility]  [address] [phone][facility] (retail)       [address] [place], [place]  [address]       Ph: [phone] or [phone]       Fax: [phone]   RxID:   [id number]

## 2010-11-06 NOTE — Progress Notes (Signed)
Summary: in the ER Yesterday    Phone Note Call from Patient Call back at Home Phone [phone]   Caller: patient triage message Call For: Lovell Sheehan Summary of Call: Was in the ER yesterday.  They could not figure out what is wrong.  Was told to contact Dr Ernst Bowler.  They do not know what to do.  Tell Dr. Lovell Sheehan he is at his mercy and whatever Lovell Sheehan wants to do next give the patient a call  Initial call taken by: Roselle Locus,  November 22, 2007 11:34 AM  Follow-up for Phone Call        the EDP states that he throught dr Blanche East was the answer as this was caused by discs not his heart Follow-up by: Stacie Glaze MD,  November 22, 2007 5:07 PM  Additional Follow-up for Phone Call Additional follow up Details #1::        They already attempted to call Dr. Blanche East, but they did not seem willing to make the appt.  Requesting [place] to make this referral? Additional Follow-up by: Lynann Beaver CMA,  November 23, 2007 8:33 AM    Additional Follow-up for Phone Call Additional follow up Details #2::    referral sent to dr hirsch/ Follow-up by: Willy Eddy, LPN,  November 23, 2007 1:01 PM

## 2010-11-06 NOTE — Procedures (Signed)
Summary: Colonoscopy Report/Dr. [facility]off[facility]  Colonoscopy Report/Dr. Medoff-[facility]   Imported By: Maryln Gottron 05/23/2009 14:20:32  _____________________________________________________________________  External Attachment:    Type:   Image     Comment:   External Document

## 2010-11-06 NOTE — Assessment & Plan Note (Signed)
Summary: f/u from er/bmw   Vital Signs:  Patient profile:   66 year old male Height:      66 inches Weight:      162 pounds BMI:     26.24 Temp:     98.2 degrees F oral Pulse rate:   68 / minute Resp:     14 per minute BP sitting:   124 / 80  (left arm)  Vitals Entered By: Willy Eddy, LPN (July 04, 2010 2:36 PM) CC: roa - feeling better - stopped nucynta but still on gabapentin Is Patient Diabetic? No   Primary Care Luciana Cammarata:  Stacie Glaze MD  CC:  roa - feeling better - stopped nucynta but still on gabapentin.  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 he continues to smoke. 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) 7)  Smoker  (ICD-305.1) 8)  Maxillary Sinusitis  (ICD-473.0) 9)  Physical Examination  (ICD-V70.0) 10)  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) 11)  Lumbar Sprain and Strain  (ICD-847.2) 12)  Chronic Obstructive Pulmonary Disease  (ICD-496) 13)  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) 14)  Leg Pain, Right  (ICD-729.5) 15)  Hyperlipidemia  (ICD-272.4) 16)  Degeneration, 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) 17)  Hypertension  (ICD-401.9) 18)  Family History Diabetes  1st Degree Relative  (ICD-V18.0) 19)  Gerd  (ICD-530.81)  Current Problems (verified): 1)  Adjustment Disorder With Depressed Mood  (ICD-309.0) 2)  Uns Advrs Eff Uns Rx Medicinal&biological Sbstnc  (ICD-995.20) 3)  Anger  (ICD-312.00) 4)  Uns Advrs Eff Uns Rx Medicinal&biological Sbstnc  (ICD-995.20) 5)  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) 6)  Smoker  (ICD-305.1) 7)  Maxillary Sinusitis  (ICD-473.0) 8)  Physical Examination  (ICD-V70.0) 9)  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) 10)  Lumbar Sprain and Strain  (ICD-847.2) 11)  Chronic Obstructive Pulmonary Disease  (ICD-496) 12)  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 Take 1 Tablet By Mouth Once A Day 2)  Neurpath-B 3-35-2 Mg Tabs (L-Methylfolate-B6-B12) .... One By Mouth Two Times A Day 3)  Prilosec 20 Mg Cpdr (Omeprazole) .... Take 1 Capsule By Mouth Once A Day 4)  Icaps Areds Formula   Tabs (Multiple Vitamins-Minerals) .... 2 Once Daily 5)  Bl Flax Seed Oil 1000 Mg  Caps (Flaxseed (Linseed)) .... Once Daily 6)  Clobetasol Propionate 0.05 %  Soln (Clobetasol Propionate) .... Apply To Scalp Daily  As Needed 7)  Gabapentin 600 Mg Tabs (Gabapentin) .... One  By Mouth Two Times A Day  Try To Cut To One At Bedtime For One Week Then Stop 8)  Lucentis 0.5 Mg/0.79ml Soln (Ranibizumab) .... Eye Injections Every 12 Weeks 9)  Nucynta 75 Mg Tabs (Tapentadol Hcl) .... One By Mouth Daily  Current Medications (verified): 1)  Aspir-81 81 Mg Tbec (Aspirin) .... Take 1 Tablet By Mouth Once A Day 2)  Neurpath-B 3-35-2 Mg Tabs (L-Methylfolate-B6-B12) .... One By Mouth Two Times A Day 3)  Prilosec 20 Mg Cpdr (Omeprazole) .... Take 1  Capsule By Mouth Once A Day 4)  Icaps Areds Formula   Tabs (Multiple Vitamins-Minerals) .... 2 Once Daily 5)  Bl Flax Seed Oil 1000 Mg  Caps (Flaxseed (Linseed)) .... Once Daily 6)  Clobetasol Propionate 0.05 %  Soln (Clobetasol Propionate) .... Apply To Scalp Daily As Needed 7)  Gabapentin 600 Mg Tabs (Gabapentin) .... One  By Mouth Two Times A Day  Try To Cut To One At Bedtime For One Week Then Stop 8)  Lucentis 0.5 Mg/0.56ml Soln (Ranibizumab) .... Eye Injections Every 12 Weeks  Allergies (verified): 1)  * Vioxx (Rofecoxib) 2)  Cipro 3)  Codeine Phosphate (Codeine Phosphate) 4)  Lipitor (Atorvastatin Calcium) 5)  Simvastatin (Simvastatin)  Past History:  Family History: Last updated: 06/16/2007 Family History Diabetes 1st degree relative Family History Liver disease Family History of Prostate CA 1st degree relative <50 Family History Weight disorder Family History of Cardiovascular disorder  Social History: Last updated: 06/16/2007 Retired Married Current Smoker Alcohol use-yes  Risk Factors: Smoking Status: current (07/04/2010)  Past medical, surgical, family and social histories (including risk factors) reviewed, and no changes noted (except as noted below).  Past Medical History: Reviewed history from 06/20/2007 and no changes required. Rheumatic Fever, hx of Renal Stones GERD Heart Murmur Anemia-NOS Hypertension macular degeneration ( [place]) Hyperlipidemia  Past Surgical History: Reviewed history from 06/16/2007 and no changes required. Cervical Discectomy Cholecystectomy Oral Surgery Knee Cartiledge Surgery Foot Surgery Appendectomy  Family History: Reviewed history from 06/16/2007 and no changes required. Family History Diabetes 1st degree relative Family History Liver disease Family History of Prostate CA 1st degree relative <50 Family History Weight disorder Family History of Cardiovascular disorder  Social History: Reviewed history from  06/16/2007 and no changes required. Retired Married Current Smoker Alcohol use-yes  Review of Systems  The patient denies anorexia, fever, weight loss, weight gain, vision loss, decreased hearing, hoarseness, chest pain, syncope, dyspnea on exertion, peripheral edema, prolonged cough, headaches, hemoptysis, abdominal pain, melena, hematochezia, severe indigestion/heartburn, hematuria, incontinence, genital sores, muscle weakness, suspicious skin lesions, transient blindness, difficulty walking, depression, unusual weight change, abnormal bleeding, enlarged lymph nodes, angioedema, breast masses, and testicular masses.    Physical Exam  General:  alert, well-developed, and well-hydrated.   Head:  normocephalic and atraumatic.   Eyes:  pupils equal and pupils round.   Ears:  R ear normal and L ear normal.   Nose:  no external deformity and no nasal discharge.   Neck:  No deformities, masses, or tenderness noted. Lungs:  normal respiratory effort and no accessory muscle use.   Heart:  normal rate and regular rhythm.   Abdomen:  soft, no distention, and no masses.   Rectal:  No external abnormalities noted. Normal sphincter tone. No rectal masses or tenderness. Msk:  No deformity or scoliosis noted of thoracic or lumbar spine.   Pulses:  R and L carotid,radial,femoral,dorsalis pedis and posterior  tibial pulses are full and equal bilaterally Neurologic:  alert & oriented X3 and DTRs symmetrical and normal.     Impression & Recommendations:  Problem # 1:  ADJUSTMENT DISORDER WITH DEPRESSED MOOD (ICD-309.0) pt feels that the gabepentin is working well the pain in the leg has significnantly decreased  Problem # 2:  LEG PAIN, RIGHT (ICD-729.5) due to the TKR the nucenta caused adverse  he want to wean of pain med and control with PT  Problem # 3:  CHRONIC OBSTRUCTIVE PULMONARY DISEASE (ICD-496)  stable but still smokes  Vaccines Reviewed: Flu Vax: Fluvax 3+ (06/20/2010)  Problem #  4:  ABSCESS, TOOTH (ICD-522.5) has had two teeth pulled and now feels better  Complete Medication List: 1)  Aspir-81 81 Mg Tbec (Aspirin) .... Take 1 tablet by mouth once a day 2)  Neurpath-b 3-35-2 Mg Tabs (L-methylfolate-b6-b12) .... One by mouth two times a day 3)  Prilosec 20 Mg Cpdr (Omeprazole) .... Take 1 capsule by mouth once a day 4)  Icaps Areds Formula Tabs (Multiple vitamins-minerals) .... 2 once daily 5)  Bl Flax Seed Oil 1000 Mg Caps (Flaxseed (linseed)) .... Once daily 6)  Clobetasol Propionate 0.05 % Soln (Clobetasol propionate) .... Apply to scalp daily as needed 7)  Gabapentin 600 Mg Tabs (Gabapentin) .... One  by mouth two times a day  try to cut to one at bedtime for one week then stop 8)  Lucentis 0.5 Mg/0.65ml Soln (Ranibizumab) .... Eye injections every 12 weeks  Patient Instructions: 1)  The plan would be 300 ( 1/2 ) in AM and 600  ( 1) for 2 weeks 2)  then 1/2 ( 300) twice a day for 2 weeks, the 1/2 ar bedtine for 2 weeks, then off. 3)  Max exercized at one time for  the first two weeks is 20 min 4)  after the 2nd week you can increase this by 5 min a week reaching a max of 45 min 5)  Please schedule a follow-up appointment in 7 weeks.

## 2010-11-06 NOTE — Assessment & Plan Note (Signed)
Summary: cpx/jls   Vital Signs:  Patient Profile:   65 Years Old Male Height:     5.6 inches Weight:      167 pounds Temp:     98.2 degrees F oral Pulse rate:   72 / minute Resp:     14 per minute BP sitting:   116 / 70  (left arm)  Vitals Entered By: Willy Eddy, LPN (March 12, 1609 8:52 AM)                 Chief Complaint:  cpx- ekg in emr-dt in 2004- colonoscopy in 2008.  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  Negative NCEP/ATP III risk factors include non-diabetic, no family history for ischemic heart disease, no ASHD (atherosclerotic heart disease), no prior stroke/TIA, no peripheral vascular disease, and no history of aortic aneurysm.       Current Allergies: * VIOXX (ROFECOXIB) CIPRO CODEINE PHOSPHATE (CODEINE PHOSPHATE) LIPITOR (ATORVASTATIN CALCIUM) SIMVASTATIN (SIMVASTATIN)  Past Medical History:    Reviewed history from 06/20/2007 and no changes required:       Rheumatic Fever, hx of       Renal Stones       GERD       Heart Murmur       Anemia-NOS       Hypertension       macular degeneration ( [place])       Hyperlipidemia  Past Surgical History:    Reviewed history from 06/16/2007 and no changes required:       Cervical Discectomy       Cholecystectomy       Oral Surgery       Knee Cartiledge Surgery       Foot Surgery  Appendectomy   Family History:    Reviewed history from 06/16/2007 and no changes required:       Family History Diabetes 1st degree relative       Family History Liver disease       Family History of Prostate CA 1st degree relative <50       Family History Weight disorder       Family History of Cardiovascular disorder  Social History:    Reviewed history from 06/16/2007 and no changes required:       Retired       Married       Current Smoker       Alcohol use-yes    Review of Systems  The patient denies anorexia, fever, weight loss, weight gain, vision loss, decreased hearing, hoarseness, chest pain, syncope, dyspnea on exertion, peripheral edema, prolonged cough, headaches, hemoptysis, abdominal pain, melena, hematochezia, severe indigestion/heartburn, hematuria, incontinence, genital sores, muscle weakness, suspicious skin lesions, transient blindness, difficulty walking, depression, unusual weight change, abnormal bleeding, enlarged lymph nodes, angioedema, breast masses, and testicular masses.     Physical Exam  General:     Well-developed,well-nourished,in no acute distress; alert,appropriate and cooperative throughout examination Ears:     R cerumen impaction  and L Cerumen impaction.   Nose:     no external deformity and no external erythema.   Mouth:     pharynx pink and moist and no posterior lymphoid hypertrophy.   Neck:     No deformities, masses, or tenderness noted. Lungs:     Normal respiratory effort, chest expands symmetrically. Lungs are clear to auscultation, no crackles or wheezes. Heart:     Normal rate and regular rhythm. S1 and S2 normal without gallop, murmur, click, rub or other extra sounds. Abdomen:     Bowel sounds positive,abdomen soft and non-tender without masses, organomegaly or hernias noted. Msk:     No deformity or scoliosis noted of thoracic or lumbar spine.   Extremities:     No clubbing, cyanosis, edema, or deformity noted with  normal full range of motion of all joints.   Neurologic:     No cranial nerve deficits noted. Station and gait are normal. Plantar reflexes are down-going bilaterally. DTRs are symmetrical throughout. Sensory, motor and coordinative functions appear intact.    Impression & Recommendations:  Problem # 1:  PHYSICAL EXAMINATION (ICD-V70.0)  Reviewed preventive care protocols, scheduled due services, and updated immunizations.   Problem # 2:  LUMBAR SPRAIN AND STRAIN (ICD-847.2) see neurosurgical  Problem # 3:  HYPERTENSION (ICD-401.9)  BP today: 116/70 Prior BP: 130/78 (11/07/2007)  Prior 10 Yr Risk Heart Disease: Not enough information (06/20/2007)  Labs Reviewed: Creat: 1.0 (03/05/2008) Chol: 231 (03/05/2008)   HDL: 34.3 (03/05/2008)   LDL: DEL (03/05/2008)   TG: 48 (03/05/2008)   Problem # 4:  HYPERLIPIDEMIA (ICD-272.4) increase to 4 caps Labs Reviewed: Chol: 231 (03/05/2008)   HDL: 34.3 (03/05/2008)   LDL: DEL (03/05/2008)   TG: 48 (03/05/2008) SGOT: 23 (03/05/2008)   SGPT: 20 (03/05/2008)  Lipid Goals: Chol Goal: 200 (06/20/2007)   HDL Goal: 40 (06/20/2007)   LDL Goal: 100 (06/20/2007)   TG Goal: 150 (06/20/2007)  Prior 10 Yr Risk Heart Disease: Not enough information (06/20/2007)   Problem # 5:  MAXILLARY SINUSITIS (ICD-473.0)  His updated medication list for this problem includes:    Amoxicillin 500 Mg Cap (Amoxicillin) .Marland Kitchen... Take 1 capsule by mouth three times a day x 10 days   Complete Medication List: 1)  Advair Diskus 100-50 Mcg/dose Misc (Fluticasone-salmeterol) .... Inhale 1 puff as directed twice a day as needed 2)  Aspir-81 81 Mg Tbec (Aspirin) .... Take 1 tablet by mouth once a day 3)  Glucosamine Sulfate 500 Mg Tabs (Glucosamine sulfate) .... Take 1 once a day 4)  Metanx 2.8-25-2 Mg Tabs (L-methylfolate-b6-b12) .... Take 1 once a day 5)  Prilosec 20 Mg Cpdr (Omeprazole) .... Take 1 capsule by mouth once a day 6)  Icaps Areds Formula Tabs (Multiple  vitamins-minerals) .... 2 once daily 7)  Bl Flax Seed Oil 1000 Mg Caps (Flaxseed (linseed)) .... Once daily 8)  Omega-3 350 Mg Caps (Omega-3 fatty acids) .... Once daily 9)  Chantix Starting Month Pak 0.5 Mg X 11 & 1 Mg X 42 Misc (Varenicline tartrate) .... As directed-not started 10)  Chantix Continuing Month Pak 1 Mg Tabs (Varenicline tartrate) .... One by mouth two times a day-not started 11)  Clobetasol Propionate 0.05 % Soln (Clobetasol propionate) .... Apply to scalp daily as needed 12)  Amoxicillin 500 Mg Cap (Amoxicillin) .... Take 1 capsule by mouth three times a day x 10 days 13)  Cortisporin 5-10000-1 Susp (Neomycin-polymyxin-hc) .... Four qtts in left ear qid  for 7 days  Lipid Assessment/Plan:      Based on NCEP/ATP III, the patient's risk factor category is "2 or more risk factors and a calculated 10 year CAD risk of > 20%".  From this information, the patient's calculated lipid goals are as follows: Total cholesterol goal is 200; LDL cholesterol goal is 100; HDL cholesterol goal is 40; Triglyceride goal is 150.  His LDL cholesterol goal has not been met.  Secondary causes for hyperlipidemia have been ruled out.  He has been counseled on adjunctive measures for lowering his cholesterol and has been provided with dietary instructions.     Patient Instructions: 1)  Please schedule a follow-up appointment in 3 months. 2)  [employer] Kr[employer]l oil ( 2 by mouth two times a day for fish oil   Prescriptions: CORTISPORIN 5-10000-1  SUSP (NEOMYCIN-POLYMYXIN-HC) four qtts in left ear QID  for 7 days  #10cc x 0   Entered and Authorized by:   Stacie Glaze MD   Signed by:   Stacie Glaze MD on 03/12/2008   Method used:   Print then Give to Patient   RxID:   [id number] AMOXICILLIN 500 MG CAP (AMOXICILLIN) Take 1 capsule by mouth three times a day X 10 days  #30 x 0   Entered and Authorized by:   Stacie Glaze MD   Signed by:   Stacie Glaze MD on 03/12/2008   Method used:   Print then  Give to Patient   RxID:   [id number]  ]

## 2010-11-06 NOTE — Assessment & Plan Note (Signed)
Summary: 3 month rov/njr---PTS WIFE RSC (BMP) // RS   Vital Signs:  Patient profile:   65 year old male Height:      66 inches Weight:      162 pounds BMI:     26.24 Temp:     98.2 degrees F oral Pulse rate:   68 / minute Resp:     12 per minute BP sitting:   110 / 74  (left arm)  Vitals Entered By: Willy Eddy, LPN (June 20, 2010 10:03 AM) CC: roa Is Patient Diabetic? No   Primary Care Darria Corvera:  Stacie Glaze MD  CC:  roa.  History of Present Illness: The pt feels he ois not doing well with his medications the metanx is expensive and the other vitamin b complexes are expensive as well and [facility]  The pain in the foot and behing his knee is not treated by the neurontin gets depressed and crys cannot exercize He has brown spots on his leg and this has him fixated the pain did not increase with titrating off the neurontin but has not been able to titrate off the neurotin very emotional about his physical condition   Preventive Screening-Counseling & Management  Alcohol-Tobacco     Smoking Status: current     Tobacco Counseling: to quit use of tobacco products  Problems Prior to Update: 1)  Uns Advrs Eff Uns Rx Medicinal&biological Sbstnc  (ICD-995.20) 2)  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) 3)  Smoker  (ICD-305.1) 4)  Maxillary Sinusitis  (ICD-473.0) 5)  Physical Examination  (ICD-V70.0) 6)  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) 7)  Lumbar Sprain and Strain  (ICD-847.2) 8)  Chronic Obstructive Pulmonary Disease  (ICD-496) 9)  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) 10)  Leg Pain, Right  (ICD-729.5) 11)  Hyperlipidemia  (ICD-272.4) 12)  Degeneration, 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) 13)  Hypertension  (ICD-401.9) 14)  Family History Diabetes 1st Degree Relative  (ICD-V18.0) 15)  Gerd  (ICD-530.81)  Current Problems (verified): 1)  Uns Advrs Eff Uns Rx Medicinal&biological Sbstnc  (ICD-995.20) 2)  Dysthymic  Disorder  (ICD-300.4) 3)  Smoker  (ICD-305.1) 4)  Maxillary Sinusitis  (ICD-473.0) 5)  Physical Examination  (ICD-V70.0) 6)  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 Take 1 Tablet By Mouth Once A Day 2)  Neurpath-B 3-35-2 Mg Tabs (L-Methylfolate-B6-B12) .... One By Mouth Two Times A Day 3)  Prilosec 20 Mg Cpdr (Omeprazole) .... Take 1 Capsule By Mouth Once A Day 4)  Icaps Areds Formula   Tabs (Multiple Vitamins-Minerals) .... 2 Once Daily 5)  Bl Flax Seed Oil 1000 Mg  Caps (Flaxseed (Linseed)) .... Once Daily 6)  Clobetasol Propionate 0.05 %  Soln (Clobetasol Propionate) .... Apply To Scalp Daily As Needed 7)  Gabapentin 600 Mg Tabs (Gabapentin) .... One in Am, One At 2 Pm and 1-2 At Bed Time 8)  Lucentis 0.5 Mg/0.104ml Soln (Ranibizumab) .... Eye Injections Every 12 Weeks  Current Medications (verified): 1)  Aspir-81 81 Mg Tbec (Aspirin) .... Take  1 Tablet By Mouth Once A Day 2)  Neurpath-B 3-35-2 Mg Tabs (L-Methylfolate-B6-B12) .... One By Mouth Two Times A Day 3)  Prilosec 20 Mg Cpdr (Omeprazole) .... Take 1 Capsule By Mouth Once A Day 4)  Icaps Areds Formula   Tabs (Multiple Vitamins-Minerals) .... 2 Once Daily 5)  Bl Flax Seed Oil 1000 Mg  Caps (Flaxseed (Linseed)) .... Once Daily 6)  Clobetasol Propionate 0.05 %  Soln (Clobetasol Propionate) .... Apply To Scalp Daily As Needed 7)  Gabapentin 600 Mg Tabs (Gabapentin) .... One in Am, One At 2 Pm and 1-2 At Bed Time 8)  Lucentis 0.5 Mg/0.25ml Soln (Ranibizumab) .... Eye Injections  Every 12 Weeks  Allergies (verified): 1)  * Vioxx (Rofecoxib) 2)  Cipro 3)  Codeine Phosphate (Codeine Phosphate) 4)  Lipitor (Atorvastatin Calcium) 5)  Simvastatin (Simvastatin)  Past History:  Family History: Last updated: 06/16/2007 Family History Diabetes 1st degree relative Family History Liver disease Family History of Prostate CA 1st degree relative <50 Family History Weight disorder Family History of Cardiovascular disorder  Social History: Last updated: 06/16/2007 Retired Married Current Smoker Alcohol use-yes  Risk Factors: Smoking Status: current (06/20/2010)  Past medical, surgical, family and social histories (including risk factors) reviewed, and no changes noted (except as noted below).  Past Medical History: Reviewed history from 06/20/2007 and no changes required. Rheumatic Fever, hx of Renal Stones GERD Heart Murmur Anemia-NOS Hypertension macular degeneration ( [place]) Hyperlipidemia  Past Surgical History: Reviewed history from 06/16/2007 and no changes required. Cervical Discectomy Cholecystectomy Oral Surgery Knee Cartiledge Surgery Foot Surgery Appendectomy  Family History: Reviewed history from 06/16/2007 and no changes required. Family History Diabetes 1st degree relative Family History Liver disease Family History of Prostate CA 1st degree relative <50 Family History Weight disorder Family History of Cardiovascular disorder  Social History: Reviewed history from 06/16/2007 and no changes required. Retired Married Current Smoker Alcohol use-yes  Review of Systems       The patient complains of difficulty walking and depression.  The patient denies anorexia, fever, weight loss, weight gain, vision loss, decreased hearing, hoarseness, chest pain, syncope, dyspnea on exertion, peripheral edema, prolonged cough, headaches, hemoptysis, abdominal pain, melena, hematochezia, severe indigestion/heartburn, hematuria, incontinence,  genital sores, muscle weakness, suspicious skin lesions, transient blindness, unusual weight change, abnormal bleeding, enlarged lymph nodes, angioedema, breast masses, and testicular masses.         Flu Vaccine Consent Questions     Do you have a history of severe allergic reactions to this vaccine? no    Any prior history of allergic reactions to egg and/or gelatin? no    Do you have a sensitivity to the preservative Thimersol? no    Do you have a past history of Guillan-Barre Syndrome? no    Do you currently have an acute febrile illness? no    Have you ever had a severe reaction to latex? no    Vaccine information given and explained to patient? yes    Are you currently pregnant? no    Lot Number:[id number]   Exp Date:04/04/2011   Site Given  Left Deltoid IM    Physical Exam  General:  Well-developed,well-nourished,in no acute distress; alert,appropriate and cooperative throughout examination Head:  normocephalic and atraumatic.   Ears:  R ear normal and L ear normal.   Nose:  no external deformity and no nasal discharge.   Mouth:  pharynx pink and moist and no erythema.   Neck:  No deformities, masses, or tenderness noted. Lungs:  normal respiratory effort and no accessory muscle use.   Heart:  normal rate and regular rhythm.   Abdomen:  soft and normal bowel sounds.   Msk:  decreased ROM, joint tenderness, and joint swelling.  surgical changes Skin:  hyperpigmentaton on knee Cervical Nodes:  No lymphadenopathy noted Axillary Nodes:  No palpable lymphadenopathy Inguinal Nodes:  No significant adenopathy Psych:  Oriented X3 and not depressed appearing.     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 Take 1 tablet by mouth once a day 2)  Neurpath-b 3-35-2 Mg Tabs (L-methylfolate-b6-b12) .... One by mouth two times a day 3)  Prilosec 20 Mg Cpdr (Omeprazole) .... Take 1 capsule by mouth once a day 4)  Icaps Areds Formula Tabs (Multiple vitamins-minerals) .... 2 once daily 5)  Bl Flax Seed Oil 1000 Mg Caps (Flaxseed (linseed)) .... Once daily 6)  Clobetasol Propionate 0.05 % Soln (Clobetasol propionate) .... Apply to scalp daily as needed 7)  Gabapentin 600 Mg Tabs (Gabapentin) .... One  by mouth two times a day  try to cut to one at bedtime for one week then stop 8)  Lucentis 0.5 Mg/0.67ml Soln (Ranibizumab) .... Eye injections every 12 weeks 9)  Nucynta 75 Mg Tabs (Tapentadol hcl) .... One by mouth daily  Other Orders: Admin 1st Vaccine (29528) Flu Vaccine 45yrs + (41324)  Patient Instructions: 1)  Please schedule a follow-up appointment in 1 month. Prescriptions: NUCYNTA 75 MG TABS (TAPENTADOL HCL) one by mouth daily  #30 x 0   Entered and Authorized by:   Stacie Glaze MD   Signed by:   Stacie Glaze MD on 06/20/2010   Method used:   Print then Give to Patient   RxID:   [id number] NUCYNTA 75 MG TABS (TAPENTADOL HCL) one by mouth daily  #30 x 0   Entered and Authorized by:   Stacie Glaze MD   Signed by:   Stacie Glaze MD on 06/20/2010   Method used:   Print then Give to Patient   RxID:   [id number] NUCYNTA 75 MG TABS (TAPENTADOL HCL) one by mouth daily  #30 x 0   Entered and Authorized by:   Stacie Glaze MD   Signed by:   Stacie Glaze MD on 06/20/2010   Method used:   Print then Give to Patient   RxID:   [id number]     Appended Document: Orders  Update     Clinical Lists Changes  Orders: Added new Service order of Venipuncture (10932) - Signed Added new Test order of TLB-Sedimentation Rate (ESR) (85652-ESR) - Signed Added new Test order of TLB-CRP-High Sensitivity (C-Reactive Protein) (86140-FCRP) - Signed

## 2010-11-06 NOTE — Assessment & Plan Note (Signed)
Summary: 3 month roa-fasting/nta  Medications Added ZITHROMAX Z-PAK 250 MG  TABS (AZITHROMYCIN) use as directed        Vital Signs:  Patient Profile:   65 Years Old Male Height:     5.6 inches Weight:      165 pounds Temp:     97.9 degrees F oral Pulse rate:   58 / minute BP sitting:   132 / 74  (left arm) Cuff size:   regular  Vitals Entered By: Orlan Leavens (September 20, 2007 8:21 AM)                 Chief Complaint:  requesting new rx for metanx year supply.  History of Present Illness: Blood pressure  Hypertension History:      He denies headache, chest pain, palpitations, dyspnea with exertion, orthopnea, PND, peripheral edema, visual symptoms, neurologic problems, syncope, and side effects from treatment.  He notes no problems with any antihypertensive medication side effects.  Further comments include: doing well.        Positive major cardiovascular risk factors include male age 39 years old or older, hyperlipidemia, hypertension, and current tobacco user.  Negative major cardiovascular risk factors include no history of diabetes and negative family history for ischemic heart disease.        Further assessment for target organ damage reveals no history of ASHD, stroke/TIA, or peripheral vascular disease.     Current Allergies (reviewed today): * VIOXX (ROFECOXIB) CIPRO (CIPROFLOXACIN) CODEINE PHOSPHATE (CODEINE PHOSPHATE) LIPITOR (ATORVASTATIN CALCIUM) SIMVASTATIN (SIMVASTATIN) Updated/Current Medications (including changes made in today's visit):  ADVAIR DISKUS 100-50 MCG/DOSE MISC (FLUTICASONE-SALMETEROL) Inhale 1 puff as directed twice a day ASPIR-81 81 MG TBEC (ASPIRIN) Take 1 tablet by mouth once a day GLUCOSAMINE SULFATE 500 MG TABS (GLUCOSAMINE SULFATE) Take 1 once a day METANX 2.8-25-2 MG TABS (L-METHYLFOLATE-B6-B12) Take 1 once a day PRILOSEC 20 MG CPDR (OMEPRAZOLE) Take 1 capsule by mouth once a day ICAPS AREDS FORMULA   TABS (MULTIPLE  VITAMINS-MINERALS) 2 once daily BL FLAX SEED OIL 1000 MG  CAPS (FLAXSEED (LINSEED)) once daily OMEGA-3 350 MG  CAPS (OMEGA-3 FATTY ACIDS) once daily ZITHROMAX Z-PAK 250 MG  TABS (AZITHROMYCIN) use as directed   Past Medical History:    Reviewed history from 06/20/2007 and no changes required:       Rheumatic Fever, hx of       Renal Stones       GERD       Heart Murmur       Anemia-NOS       Hypertension       macular degeneration ( [place])       Hyperlipidemia  Past Surgical History:    Reviewed history from 06/16/2007 and no changes required:       Cervical Discectomy       Cholecystectomy       Oral Surgery       Knee Cartiledge Surgery       Foot Surgery       Appendectomy   Family History:    Reviewed history from 06/16/2007 and no changes required:       Family History Diabetes 1st degree relative       Family History Liver disease       Family History of Prostate CA 1st degree relative <50       Family History Weight disorder       Family History of Cardiovascular disorder  Social History:    Reviewed  history from 06/16/2007 and no changes required:       Retired       Married       Current Smoker       Alcohol use-yes    Review of Systems  The patient denies anorexia, fever, weight loss, vision loss, decreased hearing, hoarseness, chest pain, syncope, dyspnea on exhertion, peripheral edema, prolonged cough, hemoptysis, abdominal pain, melena, hematochezia, severe indigestion/heartburn, hematuria, incontinence, genital sores, muscle weakness, suspicious skin lesions, transient blindness, difficulty walking, depression, unusual weight change, abnormal bleeding, enlarged lymph nodes, angioedema, and testicular masses.     Physical Exam  General:     Well-developed,well-nourished,in no acute distress; alert,appropriate and cooperative throughout examination Head:     Normocephalic and atraumatic without obvious abnormalities. No apparent alopecia or  balding. Nose:     nasal dischargemucosal pallor, mucosal edema, and airflow obstruction.   Mouth:     Oral mucosa and oropharynx without lesions or exudates.  Teeth in good repair. Neck:     No deformities, masses, or tenderness noted. Lungs:     normal respiratory effort and normal breath sounds.   Heart:     normal rate, regular rhythm, and no murmur.   Abdomen:     Bowel sounds positive,abdomen soft and non-tender without masses, organomegaly or hernias noted. Msk:     No deformity or scoliosis noted of thoracic or lumbar spine.   Neurologic:     No cranial nerve deficits noted. Station and gait are normal. Plantar reflexes are down-going bilaterally. DTRs are symmetrical throughout. Sensory, motor and coordinative functions appear intact.    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 zpack discussed smoking cessation  Problem # 2:  HYPERLIPIDEMIA (ICD-272.4) Assessment: Unchanged  Labs Reviewed: Chol: 248 (06/20/2007)   HDL: 33.6 (06/20/2007)   LDL: DEL (06/20/2007)   TG: 69 (06/20/2007) SGOT: 28 (03/10/2007)   SGPT: 27 (03/10/2007)  Lipid Goals: Chol Goal: 200 (06/20/2007)   HDL Goal: 40 (06/20/2007)   LDL Goal: 100 (06/20/2007)   TG Goal: 150 (06/20/2007)  Prior 10 Yr Risk Heart Disease: Not enough information (06/20/2007)   Problem # 3:  HYPERTENSION (ICD-401.9) Assessment: Improved  BP today: 132/74 Prior BP: 130/80 (06/20/2007)  Prior 10 Yr Risk Heart Disease: Not enough information (06/20/2007)  Labs Reviewed: Creat: 1.0 (03/10/2007) Chol: 248 (06/20/2007)   HDL: 33.6 (06/20/2007)   LDL: DEL (06/20/2007)   TG: 69 (06/20/2007)   Complete Medication List: 1)  Advair Diskus 100-50 Mcg/dose Misc (Fluticasone-salmeterol) .... Inhale 1 puff as directed twice a day 2)  Aspir-81 81 Mg Tbec (Aspirin) .... Take 1 tablet by mouth once a day 3)   Glucosamine Sulfate 500 Mg Tabs (Glucosamine sulfate) .... Take 1 once a day 4)  Metanx 2.8-25-2 Mg Tabs (L-methylfolate-b6-b12) .... Take 1 once a day 5)  Prilosec 20 Mg Cpdr (Omeprazole) .... Take 1 capsule by mouth once a day 6)  Icaps Areds Formula Tabs (Multiple vitamins-minerals) .... 2 once daily 7)  Bl Flax Seed Oil 1000 Mg Caps (Flaxseed (linseed)) .... Once daily 8)  Omega-3 350 Mg Caps (Omega-3 fatty acids) .... Once daily 9)  Zithromax Z-pak 250 Mg Tabs (Azithromycin) .... Use as directed  Hypertension Assessment/Plan:      The patient's hypertensive risk group is category B: At least one risk factor (excluding diabetes) with no target organ damage.  Today's blood pressure is 132/74.  His blood pressure goal  is < 140/90.   Patient Instructions: 1)  Please schedule a follow-up appointment in 6 months. CPX 2)  BMP prior to visit, ICD-9: 3)  Hepatic Panel prior to visit, ICD-9: 4)  Lipid Panel prior to visit, ICD-9: 5)  TSH prior to visit, ICD-9:  V70.0 6)  CBC w/ Diff prior to visit, ICD-9: 7)  Urine-dip prior to visit, ICD-9: 8)  PSA prior to visit, ICD-9:    Prescriptions: ZITHROMAX Z-PAK 250 MG  TABS (AZITHROMYCIN) use as directed  #1 x 0   Entered and Authorized by:   Stacie Glaze MD   Signed by:   Stacie Glaze MD on 09/20/2007   Method used:   Print then Give to Patient   RxID:   [id number]  ]

## 2010-11-06 NOTE — Miscellaneous (Signed)
Summary: [facility]  Discharge [facility]   Imported By: Maryln Gottron 02/27/2010 13:54:30  _____________________________________________________________________  External Attachment:    Type:   Image     Comment:   External Document

## 2010-11-06 NOTE — Letter (Signed)
Summary: [facility]  [facility]   Imported By: Kassie Mends 09/15/2007 10:16:04  _____________________________________________________________________  External Attachment:    Type:   Image     Comment:   [facility] note

## 2010-11-06 NOTE — Progress Notes (Signed)
Summary: probs with meds  Phone Note Call from Patient   Caller: Spouse Call For: Stacie Glaze MD Reason for Call: Acute Illness Summary of Call: Wife is calling for labs. States he has been to the dentist and had a tooth pulled that was cracked.  Taking Antibiotics (not sure what).  Does not like the Nucynta, and would like the suggestion of stopping all meds from Dr. [name].  Is very dizzy, and lethargic. [phone] Initial call taken by: Lynann Beaver CMA,  June 27, 2010 10:16 AM  Follow-up for Phone Call        per dr Lovell Sheehan- states ok to stop meds but he would suggest waiting until monday, because he will not be able to get in touch with an m d over the weekend Follow-up by: Willy Eddy, LPN,  June 27, 2010 1:46 PM  Additional Follow-up for Phone Call Additional follow up Details #1::        Notified pt's wife. Additional Follow-up by: Lynann Beaver CMA,  June 27, 2010 1:56 PM

## 2010-11-06 NOTE — Consult Note (Signed)
Summary: [facility]  [facility]   Imported By: Maryln Gottron 05/10/2008 15:05:44  _____________________________________________________________________  External Attachment:    Type:   Image     Comment:   External Document

## 2010-11-06 NOTE — Consult Note (Signed)
Summary: [facility]   Imported By: Kassie Mends 07/13/2008 09:55:24  _____________________________________________________________________  External Attachment:    Type:   Image     Comment:   [facility] note

## 2010-11-06 NOTE — Progress Notes (Signed)
  Phone Note Call from Patient   Caller: Patient Call For: Stacie Glaze MD Summary of Call: Pt is asking for results of MRI. [phone] Initial call taken by: Lynann Beaver CMA AAMA,  September 04, 2010 8:24 AM  Follow-up for Phone Call        there is a complicated picture at L5 S1 and he should be evaluated byu a neurosurgeon... 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

## 2010-11-06 NOTE — Progress Notes (Signed)
Summary: MRI ASAP   Phone Note Call from Patient Call back at Home Phone [phone]   Caller: patient live Call For: Lovell Sheehan  Summary of Call: he needs appt to get an MRI done before he goes to the neurologist on Friday.   Initial call taken by: Roselle Locus,  November 28, 2007 8:20 AM  Follow-up for Phone Call        is this an MRI of neck or low back? Follow-up by: Stacie Glaze MD,  November 28, 2007 9:22 AM  Additional Follow-up for Phone Call Additional follow up Details #1::        Pt. states his pain in mid back.  MRI of thoracic and lumbar spine for chronic pain Additional Follow-up by: Lynann Beaver CMA,  November 28, 2007 10:14 AM    Additional Follow-up for Phone Call Additional follow up Details #2::    see orders Follow-up by: Florentina Addison,  November 28, 2007 2:19 PM

## 2010-11-06 NOTE — Consult Note (Signed)
Summary: [facility]  [facility]   Imported By: Maryln Gottron 10/14/2010 09:27:14  _____________________________________________________________________  External Attachment:    Type:   Image     Comment:   External Document

## 2010-11-06 NOTE — Progress Notes (Signed)
Summary: Question about sleep aid  Phone Note Call from Patient Call back at [phone]   Caller: Spouse - [name] Summary of Call: Pt is having trouble sleeping, purchased a [employer] sleep aid Somnature. Is it ok for him to take this while taking  neurontin? Initial call taken by: Trixie Dredge,  April 23, 2010 11:18 AM  Follow-up for Phone Call        ok per dr Kortnee Bas-pt  wife informed Follow-up by: Willy Eddy, LPN,  April 23, 2010 12:00 PM

## 2010-11-06 NOTE — Progress Notes (Signed)
Summary: cannot take Cymbalta  Phone Note Call from Patient   Caller: Mom Call For: Ryan Glaze MD Summary of Call: Pt's wife calls to talk to Dr. Lovell Sheehan.......Marland KitchenPt is taking Cymbalta x 3 days and is having nause, dizziness, chills, fatigue.  Went back to Vicodin at a dose of 5/500  one by mouth q 6 hrs, and his pain is better.  He wants to D/C the Cymbalta, and stay with the same pain regimen. [phone] Initial call taken by: Lynann Beaver CMA,  November 13, 2009 11:12 AM  Follow-up for Phone Call        per dr Lovell Sheehan- may continue with pain med but also may have neurontin 300 at bedtime Follow-up by: Willy Eddy, LPN,  November 13, 2009 1:18 PM    New/Updated Medications: NEURONTIN 300 MG CAPS (GABAPENTIN) one by mouth q hs Prescriptions: NEURONTIN 300 MG CAPS (GABAPENTIN) one by mouth q hs  #30 x 0   Entered by:   Lynann Beaver CMA   Authorized by:   Ryan Glaze MD   Signed by:   Lynann Beaver CMA on 11/13/2009   Method used:   Electron[facility] to        [facility]  [address] [id number]       [address] [place], [place]  [address]       Ph: [phone] or [phone]       Fax: [phone]   RxID:   [id number]  Pt. notified.

## 2010-11-06 NOTE — Assessment & Plan Note (Signed)
Summary: 1 month rov/njr/pt rescd//ccm   Vital Signs:  Patient profile:   65 year old male Height:      66 inches Weight:      158 pounds BMI:     25.59 Temp:     98.2 degrees F oral Pulse rate:   68 / minute Resp:     14 per minute BP sitting:   116 / 80  (left arm)  Vitals Entered By: Willy Eddy, LPN (August 21, 2010 12:11 PM) CC: roa after stopping gabelpentin-  Is Patient Diabetic? No   Visit Type:  off the gabepentum Primary Care Provider:  Stacie Glaze MD  CC:  roa after stopping gabelpentin- .  History of Present Illness: follow up off the gabepentum and the medication seemed to have no effect The dental work was reiviewed Still cannot exercize due to pain behind the knee and the "rocks" in his feet He recalls that the pain in the foot began after surgery He is concerned that there is a pinched nerve  EMG  was negative He has not been to a neurologist yet.  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 Take 1 Tablet By Mouth Once A Day 2)  Neurpath-B 3-35-2 Mg Tabs (L-Methylfolate-B6-B12) .... One By Mouth Two Times A Day 3)  Prilosec 20 Mg Cpdr (Omeprazole) .... Take 1 Capsule By Mouth Once A Day 4)  Icaps Areds Formula   Tabs (Multiple Vitamins-Minerals) .... 2 Once Daily 5)  Bl Flax Seed Oil 1000 Mg  Caps (Flaxseed (Linseed)) .... Once Daily 6)  Clobetasol Propionate 0.05 %  Soln (Clobetasol Propionate) .... Apply To Scalp Daily As Needed 7)  Lucentis 0.5 Mg/0.37ml Soln (Ranibizumab) .... Eye Injections Every 12 Weeks  Allergies (verified): 1)  * Vioxx (Rofecoxib) 2)  Cipro (Ciprofloxacin) 3)  Codeine Phosphate (Codeine Phosphate) 4)  Lipitor (Atorvastatin Calcium) 5)  Simvastatin (Simvastatin)  Past History:  Family History: Last updated: 06/16/2007 Family History Diabetes 1st degree relative Family History Liver disease Family History of Prostate CA 1st degree relative <50 Family History Weight disorder Family History of Cardiovascular disorder  Social History: Last updated: 06/16/2007 Retired Married Current Smoker Alcohol use-yes  Risk Factors: Smoking Status: current (08/21/2010)  Past medical, surgical, family and social histories (including risk factors) reviewed, and no changes noted (except as noted below).  Past Medical History: Reviewed history from 06/20/2007 and no changes required. Rheumatic Fever, hx of Renal Stones GERD Heart Murmur Anemia-NOS Hypertension macular degeneration ( [place])  Hyperlipidemia  Past Surgical History: Reviewed history from 06/16/2007 and no changes required. Cervical Discectomy Cholecystectomy Oral Surgery Knee Cartiledge Surgery Foot Surgery Appendectomy  Family History: Reviewed history from 06/16/2007 and no changes required. Family History Diabetes 1st degree relative Family History Liver disease Family History of Prostate  CA 1st degree relative <50 Family History Weight disorder Family History of Cardiovascular disorder  Social History: Reviewed history from 06/16/2007 and no changes required. Retired Married Current Smoker Alcohol use-yes  Review of Systems  The patient denies anorexia, fever, weight loss, weight gain, vision loss, decreased hearing, hoarseness, chest pain, syncope, dyspnea on exertion, peripheral edema, prolonged cough, headaches, hemoptysis, abdominal pain, melena, hematochezia, severe indigestion/heartburn, hematuria, incontinence, genital sores, muscle weakness, suspicious skin lesions, transient blindness, difficulty walking, depression, unusual weight change, abnormal bleeding, enlarged lymph nodes, angioedema, and breast masses.    Physical Exam  General:  alert, well-developed, and well-hydrated.   Head:  normocephalic and atraumatic.   Eyes:  pupils equal and pupils round.   Ears:  R ear normal and L ear normal.   Nose:  no external deformity and no nasal discharge.   Abdomen:  soft, no distention, and no masses.   Msk:  No deformity or scoliosis noted of thoracic or lumbar spine.   Extremities:  trace left pedal edema and trace right pedal edema.   Neurologic:  alert & oriented X3 and DTRs symmetrical and normal.     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 Take 1 tablet by mouth once a day  Discussed use of moist heat or ice, modified activities, medications, and stretching/strengthening exercises. Back care instructions given. To be seen in 2 weeks if no improvement; sooner if worsening of symptoms.   Orders: Radiology Referral (Radiology)  Complete Medication List: 1)  Aspir-81 81 Mg Tbec (Aspirin) .... Take 1 tablet by mouth once a day 2)  Neurpath-b 3-35-2 Mg Tabs (L-methylfolate-b6-b12) .... One by mouth two times a day 3)  Prilosec 20 Mg Cpdr (Omeprazole) .... Take 1 capsule by mouth once a day 4)  Icaps Areds Formula Tabs (Multiple vitamins-minerals) .... 2 once daily 5)  Bl Flax Seed Oil 1000 Mg Caps (Flaxseed (linseed)) .... Once daily 6)  Clobetasol Propionate 0.05 % Soln (Clobetasol propionate) .... Apply to scalp daily as needed 7)  Lucentis 0.5 Mg/0.21ml Soln (Ranibizumab) .... Eye injections every 12 weeks  Patient Instructions: 1)  Please schedule a follow-up appointment in 1 month. Prescriptions: CLOBETASOL PROPIONATE 0.05 %  SOLN (CLOBETASOL PROPIONATE) apply to scalp daily as needed  #50cc x 11   Entered by:   Willy Eddy, LPN  Authorized by:   Stacie Glaze MD   Signed by:   Willy Eddy, LPN on 16/07/9603   Method used:   Print then Give to Patient   RxID:   [id number]    Orders Added: 1)  Est. Patient Level IV [21308] 2)  Radiology Referral [Radiology]

## 2010-11-06 NOTE — Assessment & Plan Note (Signed)
Summary: 2 mo rov/mm   Vital Signs:  Patient profile:   65 year old male Height:      66 inches Weight:      160 pounds BMI:     25.92 Temp:     97.9 degrees F oral Pulse rate:   68 / minute Resp:     12 per minute BP sitting:   110 / 70  (left arm)  Vitals Entered By: Willy Eddy, LPN (March 10, 6044 9:06 AM)  Nutrition Counseling: Patient's BMI is greater than 25 and therefore counseled on weight management options. CC: roa, Hypertension Management   CC:  roa and Hypertension Management.  Hypertension History:      He denies headache, chest pain, palpitations, dyspnea with exertion, orthopnea, PND, peripheral edema, visual symptoms, neurologic problems, syncope, and side effects from treatment.        Positive major cardiovascular risk factors include male age 70 years old or older, hyperlipidemia, hypertension, and current tobacco user.  Negative major cardiovascular risk factors include no history of diabetes and negative family history for ischemic heart disease.        Further assessment for target organ damage reveals no history of ASHD, stroke/TIA, or peripheral vascular disease.     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) 10)  Leg Pain, Right  (ICD-729.5) 11)  Hyperlipidemia  (ICD-272.4) 12)  Degeneration, 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) 13)  Hypertension  (ICD-401.9) 14)  Family History Diabetes 1st Degree Relative  (ICD-V18.0) 15)  Gerd  (ICD-530.81)  Current Problems (verified): 1)   Uns Advrs Eff Uns Rx Medicinal&biological Sbstnc  (ICD-995.20) 2)  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 Take 1 Tablet By Mouth Once A Day 2)  Neurpath-B 3-35-2 Mg Tabs (L-Methylfolate-B6-B12) .... One By Mouth Two Times A Day 3)  Prilosec 20 Mg Cpdr (Omeprazole) .... Take 1 Capsule By Mouth Once A Day 4)  Icaps Areds Formula   Tabs (Multiple Vitamins-Minerals) .... 2 Once Daily 5)  Bl Flax Seed Oil 1000 Mg  Caps (Flaxseed (Linseed)) .... Once Daily 6)  Clobetasol Propionate 0.05 %  Soln (Clobetasol Propionate) .... Apply To Scalp Daily As Needed 7)  Hydrocodone-Acetaminophen 7.5-325 Mg Tabs (Hydrocodone-Acetaminophen) .... One By Mouth Q 6 Hours Prn 8)  Gabapentin 600 Mg Tabs (Gabapentin) .... One in Am, One At 2 Pm and 1-2 At Bed Time 9)  Lucentis 0.5 Mg/0.44ml Soln (Ranibizumab) .... Eye Injections Every 12 Weeks  Current Medications (verified): 1)  Aspir-81 81 Mg Tbec (Aspirin) .... Take 1 Tablet By Mouth Once A Day 2)  Neurpath-B 3-35-2 Mg Tabs (L-Methylfolate-B6-B12) .... One By Mouth Two Times A Day 3)  Prilosec 20 Mg Cpdr (Omeprazole) .... Take 1 Capsule By Mouth Once A Day 4)  Icaps Areds Formula   Tabs (Multiple Vitamins-Minerals) .... 2 Once Daily 5)  Bl Flax Seed Oil 1000 Mg  Caps (Flaxseed (Linseed)) .... Once Daily 6)  Clobetasol Propionate 0.05 %  Soln (Clobetasol Propionate) ....  Apply To Scalp Daily As Needed 7)  Gabapentin 600 Mg Tabs (Gabapentin) .... One in Am, One At 2 Pm and 1-2 At Bed Time 8)  Lucentis 0.5 Mg/0.88ml Soln (Ranibizumab) .... Eye Injections Every 12 Weeks  Allergies (verified): 1)  * Vioxx (Rofecoxib) 2)  Cipro 3)  Codeine Phosphate (Codeine Phosphate) 4)  Lipitor (Atorvastatin Calcium) 5)  Simvastatin (Simvastatin)  Past History:  Family History: Last updated: 06/16/2007 Family History Diabetes 1st degree relative Family History Liver disease Family History of Prostate CA 1st degree relative <50 Family History Weight disorder Family History of Cardiovascular disorder  Social History: Last updated: 06/16/2007 Retired Married Current Smoker Alcohol use-yes  Risk Factors: Smoking Status: current (03/10/2010)  Past medical, surgical, family and social histories (including risk factors) reviewed, and no changes noted (except as noted below).  Past Medical History: Reviewed history from 06/20/2007 and no changes required. Rheumatic Fever, hx of Renal Stones GERD Heart Murmur Anemia-NOS Hypertension macular degeneration ( [place]) Hyperlipidemia  Past Surgical History: Reviewed history from 06/16/2007 and no changes required. Cervical Discectomy Cholecystectomy Oral Surgery Knee Cartiledge Surgery Foot Surgery Appendectomy  Family History: Reviewed history from 06/16/2007 and no changes required. Family History Diabetes 1st degree relative Family History Liver disease Family History of Prostate CA 1st degree relative <50 Family History Weight disorder Family History of Cardiovascular disorder  Social History: Reviewed history from 06/16/2007 and no changes required. Retired Married Current Smoker Alcohol use-yes  Review of Systems  The patient denies anorexia, fever, weight loss, weight gain, vision loss, decreased hearing, hoarseness, chest pain, syncope, dyspnea on exertion, peripheral edema, prolonged cough,  headaches, hemoptysis, abdominal pain, melena, hematochezia, severe indigestion/heartburn, hematuria, incontinence, genital sores, muscle weakness, suspicious skin lesions, transient blindness, difficulty walking, depression, unusual weight change, abnormal bleeding, enlarged lymph nodes, angioedema, and breast masses.    Physical Exam  General:  Well-developed,well-nourished,in no acute distress; alert,appropriate and cooperative throughout examination Head:  normocephalic and atraumatic.   Eyes:  pupils equal and pupils round.   Ears:  R ear normal and L ear normal.   Nose:  no external deformity and no nasal discharge.   Mouth:  pharynx pink and moist and no erythema.   Lungs:  normal respiratory effort and no accessory muscle use.   Heart:  normal rate and regular rhythm.   Abdomen:  soft and normal bowel sounds.   Msk:  decreased ROM, joint tenderness, and joint swelling.  surgical changes Extremities:  trace left pedal edema and trace right pedal edema.   Neurologic:  alert & oriented X3 and abnormal gait.     Impression & Recommendations:  Problem # 1:  CHRONIC OBSTRUCTIVE PULMONARY DISEASE (ICD-496) Assessment Unchanged stop smoking Vaccines Reviewed: Flu Vax: Historical (07/18/2009)  Problem # 2:  HYPERLIPIDEMIA (ICD-272.4)  Labs Reviewed: SGOT: 18 (11/11/2009)   SGPT: 17 (11/11/2009)  Lipid Goals: Chol Goal: 200 (06/20/2007)   HDL Goal: 40 (06/20/2007)   LDL Goal: 100 (06/20/2007)   TG  Goal: 150 (06/20/2007)  10 Yr Risk Heart Disease: 27 % Prior 10 Yr Risk Heart Disease: 33 % (09/04/2008)   HDL:27.90 (07/18/2009), 36.90 (03/15/2009)  LDL:154 (07/18/2009), DEL (09/04/2008)  Chol:199 (07/18/2009), 215 (03/15/2009)  Trig:84.0 (07/18/2009), 55.0 (03/15/2009)  Problem # 3:  HYPERTENSION (ICD-401.9)  BP today: 110/70 Prior BP: 130/80 (01/06/2010)  10 Yr Risk Heart Disease: 27 % Prior 10 Yr Risk Heart Disease: 33 % (09/04/2008)  Labs Reviewed: K+: 4.6  (03/15/2009) Creat: : 0.9 (03/15/2009)   Chol: 199 (07/18/2009)   HDL: 27.90 (07/18/2009)   LDL: 154 (07/18/2009)   TG: 84.0 (07/18/2009)  Problem # 4:  LEG PAIN, RIGHT (ICD-729.5) on the neurontin foot swelling in the late afternoon  Complete Medication List: 1)  Aspir-81 81 Mg Tbec (Aspirin) .... Take 1 tablet by mouth once a day 2)  Neurpath-b 3-35-2 Mg Tabs (L-methylfolate-b6-b12) .... One by mouth two times a day 3)  Prilosec 20 Mg Cpdr (Omeprazole) .... Take 1 capsule by mouth once a day 4)  Icaps Areds Formula Tabs (Multiple vitamins-minerals) .... 2 once daily 5)  Bl Flax Seed Oil 1000 Mg Caps (Flaxseed (linseed)) .... Once daily 6)  Clobetasol Propionate 0.05 % Soln (Clobetasol propionate) .... Apply to scalp daily as needed 7)  Gabapentin 600 Mg Tabs (Gabapentin) .... One in am, one at 2 pm and 1-2 at bed time 8)  Lucentis 0.5 Mg/0.22ml Soln (Ranibizumab) .... Eye injections every 12 weeks  Hypertension Assessment/Plan:      The patient's hypertensive risk group is category B: At least one risk factor (excluding diabetes) with no target organ damage.  His calculated 10 year risk of coronary heart disease is 27 %.  Today's blood pressure is 110/70.  His blood pressure goal is < 140/90.  Patient Instructions: 1)  Please schedule a follow-up appointment in 3 months. Prescriptions: CLOBETASOL PROPIONATE 0.05 %  SOLN (CLOBETASOL PROPIONATE) apply to scalp daily as needed  #50cc x 11   Entered by:   Willy Eddy, LPN   Authorized by:   Stacie Glaze MD   Signed by:   Willy Eddy, LPN on 16/07/9603   Method used:   Electronically to        [facility]  [address] [phone]* (retail)       [address]       [place], [place]  [address]       Ph: [phone] or [phone]       Fax: [phone]   RxID:   [id number]

## 2010-11-06 NOTE — Consult Note (Signed)
Summary: Dr Nickola Major note  Dr Nickola Major note   Imported By: Kassie Mends 12/20/2007 08:12:54  _____________________________________________________________________  External Attachment:    Type:   Image     Comment:   Dr Nickola Major note

## 2010-11-06 NOTE — Assessment & Plan Note (Signed)
Summary: 3 month rov/njr/PT RESCD/CCM   Vital Signs:  Patient Profile:   65 Years Old Male Height:     5.6 inches Weight:      168 pounds Temp:     98.6 degrees F oral Pulse rate:   70 / minute Resp:     14 per minute BP sitting:   124 / 82  (left arm)  Vitals Entered By: Willy Eddy, LPN (September 04, 2008 9:28 AM)                 Chief Complaint:  ROA.  History of Present Illness: Review of the consultants report from [facility] Pt has started the kril oil for lipids Has not used the adavir and has decreased the amount of smoking but still smokes!  Hypertension History:      He denies headache, chest pain, palpitations, dyspnea with exertion, orthopnea, PND, peripheral edema, visual symptoms, neurologic problems, syncope, and side effects from treatment.        Positive major cardiovascular risk factors include male age 64 years old or older, hyperlipidemia, hypertension, and current tobacco user.  Negative major cardiovascular risk factors include no history of diabetes and negative family history for ischemic heart disease.        Further assessment for target organ damage reveals no history of ASHD, stroke/TIA, or peripheral vascular disease.       Prior Medication List:  ADVAIR DISKUS 100-50 MCG/DOSE MISC (FLUTICASONE-SALMETEROL) Inhale 1 puff as directed twice a day as needed ASPIR-81 81 MG TBEC (ASPIRIN) Take 1 tablet by mouth once a day GLUCOSAMINE SULFATE 500 MG TABS (GLUCOSAMINE SULFATE) Take 1 once a day METANX 2.8-25-2 MG TABS (L-METHYLFOLATE-B6-B12) Take 1 once a day PRILOSEC 20 MG CPDR (OMEPRAZOLE) Take 1 capsule by mouth once a day ICAPS AREDS FORMULA   TABS (MULTIPLE VITAMINS-MINERALS) 2 once daily BL FLAX SEED OIL 1000 MG  CAPS (FLAXSEED (LINSEED)) once daily OMEGA-3 350 MG  CAPS (OMEGA-3 FATTY ACIDS) once daily CHANTIX STARTING MONTH PAK 0.5 MG X 11 & 1 MG X 42  MISC (VARENICLINE TARTRATE) as directed-NOT STARTED CHANTIX CONTINUING MONTH PAK 1 MG   TABS (VARENICLINE TARTRATE) one by mouth two times a day-NOT STARTED CLOBETASOL PROPIONATE 0.05 %  SOLN (CLOBETASOL PROPIONATE) apply to scalp daily as needed CORTISPORIN 5-10000-1  SUSP (NEOMYCIN-POLYMYXIN-HC) four qtts in left ear QID  for 7 days   Current Allergies (reviewed today): * VIOXX (ROFECOXIB) CIPRO CODEINE PHOSPHATE (CODEINE PHOSPHATE) LIPITOR (ATORVASTATIN CALCIUM) SIMVASTATIN (SIMVASTATIN)  Past Medical History:    Reviewed history from 06/20/2007 and no changes required:       Rheumatic Fever, hx of       Renal Stones       GERD       Heart Murmur       Anemia-NOS       Hypertension       macular degeneration ( [place])       Hyperlipidemia  Past Surgical History:    Reviewed history from 06/16/2007 and no changes required:       Cervical Discectomy       Cholecystectomy       Oral Surgery       Knee Cartiledge Surgery       Foot Surgery       Appendectomy   Family History:    Reviewed history from 06/16/2007 and no changes required:       Family History Diabetes 1st degree relative  Family History Liver disease       Family History of Prostate CA 1st degree relative <50       Family History Weight disorder       Family History of Cardiovascular disorder  Social History:    Reviewed history from 06/16/2007 and no changes required:       Retired       Married       Current Smoker       Alcohol use-yes   Risk Factors: Tobacco use:  current Alcohol use:  yes  Family History Risk Factors:    Family History of MI in females < 39 years old:  no    Family History of MI in males < 91 years old:  no   Review of Systems  The patient denies anorexia, fever, weight loss, weight gain, vision loss, decreased hearing, hoarseness, chest pain, syncope, dyspnea on exertion, peripheral edema, prolonged cough, headaches, hemoptysis, abdominal pain, melena, hematochezia, severe indigestion/heartburn, hematuria, incontinence, genital sores, muscle weakness,  suspicious skin lesions, transient blindness, difficulty walking, depression, unusual weight change, abnormal bleeding, enlarged lymph nodes, angioedema, breast masses, and testicular masses.     Physical Exam  General:     Well-developed,well-nourished,in no acute distress; alert,appropriate and cooperative throughout examination Ears:     R cerumen impaction and L Cerumen impaction.   Mouth:     pharynx pink and moist and no posterior lymphoid hypertrophy.   Neck:     No deformities, masses, or tenderness noted. Lungs:     Normal respiratory effort, chest expands symmetrically. Lungs are clear to auscultation, no crackles or wheezes. Heart:     Normal rate and regular rhythm. S1 and S2 normal without gallop, murmur, click, rub or other extra sounds. Abdomen:     Bowel sounds positive,abdomen soft and non-tender without masses, organomegaly or hernias noted. Msk:     No deformity or scoliosis noted of thoracic or lumbar spine.      Impression & Recommendations:  Problem # 1:  HYPERLIPIDEMIA (ICD-272.4) to evaluate the effect of the krill oil was intlerat of statins and fibrinates... he also has a problem with fish oil due to the taste Labs Reviewed: Chol: 231 (03/05/2008)   HDL: 34.3 (03/05/2008)   LDL: 178.7 (03/05/2008)   TG: 48 (03/05/2008) SGOT: 23 (03/05/2008)   SGPT: 20 (03/05/2008)  Lipid Goals: Chol Goal: 200 (06/20/2007)   HDL Goal: 40 (06/20/2007)   LDL Goal: 100 (06/20/2007)   TG Goal: 150 (06/20/2007)  10 Yr Risk Heart Disease: 33 % Prior 10 Yr Risk Heart Disease: Not enough information (06/20/2007)  Orders: Venipuncture (84132) TLB-Lipid Panel (80061-LIPID)   Problem # 2:  HYPERTENSION (ICD-401.9)  BP today: 124/82 Prior BP: 116/70 (03/12/2008)  10 Yr Risk Heart Disease: 33 % Prior 10 Yr Risk Heart Disease: Not enough information (06/20/2007)  Labs Reviewed: Creat: 1.0 (03/05/2008) Chol: 231 (03/05/2008)   HDL: 34.3 (03/05/2008)   LDL: 178.7  (03/05/2008)   TG: 48 (03/05/2008)   Problem # 3:  GERD (ICD-530.81) Assessment: Unchanged  His updated medication list for this problem includes:    Prilosec 20 Mg Cpdr (Omeprazole) .Marland Kitchen... Take 1 capsule by mouth once a day  Diagnostics Reviewed:  Discussed lifestyle modifications, diet, antacids/medications, and preventive measures. Handout provided.   Complete Medication List: 1)  Aspir-81 81 Mg Tbec (Aspirin) .... Take 1 tablet by mouth once a day 2)  Glucosamine Sulfate 500 Mg Tabs (Glucosamine sulfate) .... Take 1 once a day 3)  Metanx 2.8-25-2 Mg Tabs (L-methylfolate-b6-b12) .... Take 1 once a day 4)  Prilosec 20 Mg Cpdr (Omeprazole) .... Take 1 capsule by mouth once a day 5)  Icaps Areds Formula Tabs (Multiple vitamins-minerals) .... 2 once daily 6)  Bl Flax Seed Oil 1000 Mg Caps (Flaxseed (linseed)) .... Once daily 7)  Chantix Starting Month Pak 0.5 Mg X 11 & 1 Mg X 42 Misc (Varenicline tartrate) .... As directed-not started 8)  Clobetasol Propionate 0.05 % Soln (Clobetasol propionate) .... Apply to scalp daily as needed 9)  Krill Oil  .Marland Kitchen.. 1 once daily  Hypertension Assessment/Plan:      The patient's hypertensive risk group is category B: At least one risk factor (excluding diabetes) with no target organ damage.  His calculated 10 year risk of coronary heart disease is 33 %.  Today's blood pressure is 124/82.  His blood pressure goal is < 140/90.   Patient Instructions: 1)  Please schedule a follow-up appointment in 3 months.   ]

## 2010-11-06 NOTE — Assessment & Plan Note (Signed)
Summary: roa   Vital Signs:  Patient Profile:   65 Years Old Male Height:     66 inches Weight:      170 pounds Temp:     97.5 degrees F oral Pulse rate:   76 / minute Resp:     12 per minute BP sitting:   130 / 80  (left arm)  Vitals Entered By: Willy Eddy, LPN (June 20, 2007 7:55 AM)                 Chief Complaint:  roa/f/u labs from last ov.  Hypertension History:      He denies headache, chest pain, palpitations, dyspnea with exertion, orthopnea, PND, peripheral edema, visual symptoms, neurologic problems, syncope, and side effects from treatment.        Positive major cardiovascular risk factors include male age 66 years old or older, hyperlipidemia, hypertension, and current tobacco user.  Negative major cardiovascular risk factors include no history of diabetes and negative family history for ischemic heart disease.        Further assessment for target organ damage reveals no history of ASHD, stroke/TIA, or peripheral vascular disease.    Lipid Management History:      Positive NCEP/ATP III risk factors include male age 8 years old or older, HDL cholesterol less than 40, current tobacco user, and hypertension.  Negative NCEP/ATP III risk factors include non-diabetic, no family history for ischemic heart disease, no ASHD (atherosclerotic heart disease), no prior stroke/TIA, no peripheral vascular disease, and no history of aortic aneurysm.      Current Allergies: * VIOXX (ROFECOXIB) CIPRO (CIPROFLOXACIN) CODEINE PHOSPHATE (CODEINE PHOSPHATE) LIPITOR (ATORVASTATIN CALCIUM) SIMVASTATIN (SIMVASTATIN)  Past Medical History:    Reviewed history from 06/16/2007 and no changes required:       Rheumatic Fever, hx of       Renal Stones       GERD       Heart Murmur       Anemia-NOS       Hypertension       macular degeneration ( [place])       Hyperlipidemia  Past Surgical History:    Reviewed history from 06/16/2007 and no changes required:       Cervical  Discectomy       Cholecystectomy       Oral Surgery       Knee Cartiledge Surgery       Foot Surgery       Appendectomy     Review of Systems       see HTN and LIPID ros   Physical Exam  General:     Well-developed,well-nourished,in no acute distress; alert,appropriate and cooperative throughout examination Nose:     no external erythema.   Mouth:     pharynx pink and moist.   Neck:     supple.   Heart:     normal rate and regular rhythm.   Abdomen:     soft.   Rectal:     no external abnormalities.   Msk:     normal ROM, no joint tenderness, no joint swelling, and no joint warmth.   Pulses:     R and L carotid,radial,femoral,dorsalis pedis and posterior tibial pulses are full and equal bilaterally Extremities:     No clubbing, cyanosis, edema, or deformity noted with normal full range of motion of all joints.   Neurologic:     alert & oriented X3.  Impression & Recommendations:  Problem # 1:  HYPERLIPIDEMIA (ICD-272.4) Increased LDLc cholesterol to 171 Had failed lipitor and crestor secondary to severe myalgias Labs Reviewed: Chol: 255 (03/10/2007)   HDL: 38.2 (03/10/2007)   LDL: DEL (03/10/2007)   TG: 81 (03/10/2007) SGOT: 28 (03/10/2007)   SGPT: 27 (03/10/2007) Pt refeused to try statin on omega 3 and metanx and diet changes  Orders: TLB-Lipid Panel (80061-LIPID)   Problem # 2:  GERD (ICD-530.81)  His updated medication list for this problem includes:    Prilosec 20 Mg Cpdr (Omeprazole) .Marland Kitchen... Take 1 capsule by mouth once a day  Diagnostics Reviewed:  Discussed lifestyle modifications, diet, antacids/medications, and preventive measures. Handout provided.   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 Inhale 1 puff as directed twice a day 2)  Aspir-81 81 Mg Tbec (Aspirin) .... Take 1 tablet by mouth once a day 3)  Glucosamine Sulfate 500 Mg Tabs (Glucosamine sulfate) .... Take 1 once a day 4)  Metanx 2.8-25-2 Mg Tabs (L-methylfolate-b6-b12) .... Take 1 once a day 5)  Prilosec 20 Mg Cpdr (Omeprazole) .... Take 1 capsule by mouth once a day 6)  Icaps Areds Formula Tabs (Multiple vitamins-minerals) .... 2 once daily 7)  Bl Flax Seed Oil 1000 Mg Caps (Flaxseed (linseed)) .... Once daily 8)  Omega-3 350 Mg Caps (Omega-3 fatty acids) .... Once daily  Hypertension Assessment/Plan:      The patient's hypertensive risk group is category B: At least one risk factor (excluding diabetes) with no target organ damage.  Today's blood pressure is 130/80.  His blood pressure goal is < 140/90.  Lipid Assessment/Plan:      Based on NCEP/ATP III, the patient's risk factor category is "2 or more risk factors and a calculated 10 year CAD risk of > 20%".  From this information, the patient's calculated lipid goals are as follows: Total cholesterol goal is 200; LDL cholesterol goal is 100; HDL cholesterol goal is 40; Triglyceride goal is 150.  His LDL cholesterol goal has not been met.  Secondary causes for hyperlipidemia have been ruled out.  He has been counseled on adjunctive measures for lowering his cholesterol and has been provided with dietary instructions.     Patient Instructions: 1)  Please schedule a follow-up appointment in 3 months. fasting    ]  Appended Document: roa

## 2010-11-06 NOTE — Progress Notes (Signed)
Summary: Pt req script for Hydrocodone Acetaminophen#60 [facility]  Phone Note Call from Patient Call back at [facility] Phone [phone]   Caller: Patient Summary of Call: Pt called req script for Hydrocodone Acetaminophen60. Please call in to [facility]. Initial call taken by: Lucy Antigua,  January 20, 2010 8:38 AM    Prescriptions: HYDROCODONE-ACETAMINOPHEN 7.5-325 MG TABS (HYDROCODONE-ACETAMINOPHEN) ONE by mouth Q 6 HOURS PRN  #30 x 1   Entered by:   Willy Eddy, LPN   Authorized by:   Stacie Glaze MD   Signed by:   Willy Eddy, LPN on 09/81/1914   Method used:   Telephoned to ...       [facility]  [address] [phone][facility] (retail)       [address]       [place], [place]  [address]       Ph: [phone] or [phone]       Fax: [phone]   RxID:   [id number]

## 2010-11-06 NOTE — Letter (Signed)
Summary: Romona Curls DDS  Romona Curls DDS   Imported By: Sherian Rein 08/20/2010 10:02:11  _____________________________________________________________________  External Attachment:    Type:   Image     Comment:   External Document

## 2010-11-06 NOTE — Miscellaneous (Signed)
Summary: Initial Evaluation Plan of Care/[facility]  Initial Evaluation Plan of Care/[facility]   Imported By: Maryln Gottron 02/10/2010 14:42:14  _____________________________________________________________________  External Attachment:    Type:   Image     Comment:   External Document

## 2010-11-06 NOTE — Progress Notes (Signed)
Summary: Pt has rsc ov to 10/29/10, due to surgery  Phone Note Call from Patient Call back at Home Phone [phone] Call back at [phone]    Caller: Patient Reason for Call: Acute Illness Summary of Call: Pt called and has rsc ov for 10/29/10 at 11:15am, since pt is having surgery. Just wanted to make Dr. Lovell Sheehan aware.  Initial call taken by: Lucy Antigua,  September 05, 2010 2:16 PM  Follow-up for Phone Call        dr Lovell Sheehan aware Follow-up by: Willy Eddy, LPN,  September 05, 2010 2:23 PM

## 2010-11-06 NOTE — Letter (Signed)
Summary: [facility]  [facility]   Imported By: Maryln Gottron 09/19/2010 12:27:54  _____________________________________________________________________  External Attachment:    Type:   Image     Comment:   External Document

## 2010-11-06 NOTE — Assessment & Plan Note (Signed)
Summary: 1 month rov/njr/pt rsc per dr/cjr   Vital Signs:  Patient profile:   65 year old male Height:      66 inches Weight:      150 pounds BMI:     24.30 Temp:     98.1 degrees F oral Pulse rate:   68 / minute Resp:     14 per minute BP sitting:   140 / 80  (left arm)  Vitals Entered By: Willy Eddy, LPN (October 29, 2010 11:48 AM) CC: roa-c/o weight loss, Hypertension Management Is Patient Diabetic? No   Primary Care Provider:  Stacie Glaze MD  CC:  roa-c/o weight loss and Hypertension Management.  History of Present Illness:  patient is a 65 year old white male followed for hypertension hyperlipidemia and chronic back pain. a note from the neurosurgeon dated December 14 was reviewed showing that the patient has persistent back pain and was started on a nonsteroidal as an intervention with consideration for an epidural injection if the nonsteroidal should not help relieve his back.  The patient proceeded to have his first epidural which did creating from his radicular pain to his foot.  the patient has lost weight persistently over the past year and has lost 8 pounds in the past 3 months there has been some affect on his sense of taste and smell due to his dental abscess.   Hypertension History:      He denies headache, chest pain, palpitations, dyspnea with exertion, orthopnea, PND, peripheral edema, visual symptoms, neurologic problems, syncope, and side effects from treatment.        Positive major cardiovascular risk factors include male age 37 years old or older, hyperlipidemia, hypertension, and current tobacco user.  Negative major cardiovascular risk factors include no history of diabetes and negative family history for ischemic heart disease.        Further assessment for target organ damage reveals no history of ASHD, stroke/TIA, or peripheral vascular disease.     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) 8)  Smoker  (ICD-305.1) 9)  Maxillary Sinusitis  (ICD-473.0) 10)  Physical Examination  (ICD-V70.0) 11)  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) 12)  Lumbar Sprain and Strain  (ICD-847.2) 13)  Chronic Obstructive Pulmonary Disease  (ICD-496) 14)  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 Take 1 Tablet By Mouth Once A Day 2)  Neurpath-B 3-35-2 Mg Tabs (L-Methylfolate-B6-B12) .... One By Mouth Two Times A Day 3)  Prilosec 20 Mg Cpdr (Omeprazole) .... Take 1 Capsule By Mouth Once A Day 4)  Icaps Areds Formula   Tabs (Multiple Vitamins-Minerals) .... 2 Once Daily 5)  Bl Flax Seed Oil 1000 Mg  Caps (Flaxseed (Linseed)) .... Once Daily 6)  Clobetasol Propionate 0.05 %  Soln (  Clobetasol Propionate) .... Apply To Scalp Daily As Needed 7)  Lucentis 0.5 Mg/0.53ml Soln (Ranibizumab) .... Eye Injections Every 12 Weeks  Allergies (verified): 1)  * Vioxx (Rofecoxib) 2)  Cipro (Ciprofloxacin) 3)  Codeine Phosphate (Codeine Phosphate) 4)  Lipitor (Atorvastatin Calcium) 5)  Simvastatin (Simvastatin)  Past History:  Family History: Last updated: 06/16/2007 Family History Diabetes 1st degree relative Family History Liver disease Family History of Prostate CA 1st degree  relative <50 Family History Weight disorder Family History of Cardiovascular disorder  Social History: Last updated: 06/16/2007 Retired Married Current Smoker Alcohol use-yes  Risk Factors: Smoking Status: current (10/29/2010)  Past medical, surgical, family and social histories (including risk factors) reviewed, and no changes noted (except as noted below).  Past Medical History: Reviewed history from 06/20/2007 and no changes required. Rheumatic Fever, hx of Renal Stones GERD Heart Murmur Anemia-NOS Hypertension macular degeneration ( [place]) Hyperlipidemia  Past Surgical History: Reviewed history from 06/16/2007 and no changes required. Cervical Discectomy Cholecystectomy Oral Surgery Knee Cartiledge Surgery Foot Surgery Appendectomy  Family History: Reviewed history from 06/16/2007 and no changes required. Family History Diabetes 1st degree relative Family History Liver disease Family History of Prostate CA 1st degree relative <50 Family History Weight disorder Family History of Cardiovascular disorder  Social History: Reviewed history from 06/16/2007 and no changes required. Retired Married Current Smoker Alcohol use-yes  Review of Systems  The patient denies anorexia, fever, weight loss, weight gain, vision loss, decreased hearing, hoarseness, chest pain, syncope, dyspnea on exertion, peripheral edema, prolonged cough, headaches, hemoptysis, abdominal pain, melena, hematochezia, severe indigestion/heartburn, hematuria, incontinence, genital sores, muscle weakness, suspicious skin lesions, transient blindness, difficulty walking, depression, unusual weight change, abnormal bleeding, enlarged lymph nodes, angioedema, and breast masses.    Physical Exam  General:  alert, well-developed, and well-hydrated.   Head:  normocephalic and atraumatic.   Eyes:  pupils equal and pupils round.   Nose:  no external deformity and no nasal discharge.   Mouth:  pharynx  pink and moist and no erythema.   Neck:  No deformities, masses, or tenderness noted. Lungs:  normal respiratory effort and no accessory muscle use.   Heart:  normal rate and regular rhythm.   Abdomen:  soft, no distention, and no masses.   Msk:  No deformity or scoliosis noted of thoracic or lumbar spine.   Neurologic:  alert & oriented X3 and DTRs symmetrical and normal.   Skin:  hyperpigmentaton on knee Psych:  Oriented X3 and not depressed appearing.     Impression & Recommendations:  Problem # 1:  LUMBAR RADICULOPATHY, RIGHT (ICD-724.4) Assessment Improved  this is due to the gait change from the knee surgery and improved from epidural His updated medication list for this problem includes:    Aspir-81 81 Mg Tbec (Aspirin) .Marland Kitchen... Take 1 tablet by mouth once a day  Problem # 2:  ABSCESS, TOOTH (ICD-522.5)  has lost 4 teeth due to abscess formation from crack crowns  Problem # 3:  SMOKER (ICD-305.1) Assessment: Deteriorated persistant smoker Encouraged smoking cessation and discussed different methods for smoking cessation.   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 Discussed treatment options including indications for coronal CT scan of sinuses and ENT referral.   Problem # 5:  WEIGHT LOSS, RECENT (ICD-783.21)  The patient's persistent weight loss may be related both to his chronic pain condition as well as recent dental abscess he  we will recommend a muscle milkshake daily to increase his protein and continue with his regular eating if this does not result in stabilization and/or weight gain and we will pursue a more detailed workup of weight loss.  Complete Medication List: 1)  Aspir-81 81 Mg Tbec (Aspirin) .... Take 1 tablet by mouth once a day 2)  Folbee 2.5-25-1 Mg Tabs (Folic acid-vit b6-vit b12) .... One by mouth daily may sub for similar 3)   Prilosec 20 Mg Cpdr (Omeprazole) .... Take 1 capsule by mouth once a day 4)  Icaps Areds Formula Tabs (Multiple vitamins-minerals) .... 2 once daily 5)  Bl Flax Seed Oil 1000 Mg Caps (Flaxseed (linseed)) .... Once daily 6)  Clobetasol Propionate 0.05 % Soln (Clobetasol propionate) .... Apply to scalp daily as needed 7)  Lucentis 0.5 Mg/0.95ml Soln (Ranibizumab) .... Eye injections every 12 weeks  Hypertension Assessment/Plan:      The patient's hypertensive risk group is category B: At least one risk factor (excluding diabetes) with no target organ damage.  His calculated 10 year risk of coronary heart disease is 40 %.  Today's blood pressure is 140/80.  His blood pressure goal is < 140/90.  Patient Instructions: 1)   one scoop chocolate muscle milk milk cup of ice half banana a teaspoon of smooth peanut butter for your chocolate peanut butter smoothie each day 2)  Please schedule a follow-up appointment in 2 months. 3)   use simply saline nasal lavage kit... Prescriptions: FOLBEE 2.5-25-1 MG TABS (FOLIC ACID-VIT B6-VIT B12) one by mouth daily may sub for similar  #30 x 11   Entered and Authorized by:   Stacie Glaze MD   Signed by:   Stacie Glaze MD on 10/29/2010   Method used:   Print then Give to Patient   RxID:   [id number]    Orders Added: 1)  Est. Patient Level IV [53664]

## 2010-11-06 NOTE — Assessment & Plan Note (Signed)
Summary: 1 month fup//ccm   Vital Signs:  Patient profile:   65 year old male Height:      66 inches Weight:      160 pounds BMI:     25.92 Temp:     98.2 degrees F oral Pulse rate:   72 / minute Resp:     14 per minute BP sitting:   130 / 80  (left arm)  Vitals Entered By: Willy Eddy, LPN (January 07, 5620 10:15 AM) CC: roa   CC:  roa.  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 Take 1 Tablet By Mouth Once A Day 2)  Neurpath-B 3-35-2 Mg Tabs (L-Methylfolate-B6-B12) .... One By Mouth Two Times A Day 3)  Prilosec 20 Mg Cpdr (Omeprazole) .... Take 1 Capsule By Mouth Once A Day 4)  Icaps Areds Formula   Tabs (Multiple Vitamins-Minerals) .... 2 Once Daily 5)  Bl Flax Seed Oil 1000 Mg  Caps (Flaxseed (Linseed)) .... Once Daily 6)  Clobetasol Propionate 0.05 %  Soln (Clobetasol Propionate) .... Apply To Scalp Daily As Needed 7)  Vicodin Es 7.5-750 Mg Tabs  (Hydrocodone-Acetaminophen) .Marland Kitchen.. 1 Every 6-8 Hours As Needed Pain 8)  Neurontin 300 Mg Caps (Gabapentin) .... One By Mouth Two Times A Day  For Two Week and Increase To Three Times A Day  One in Am and Two At [facility]) 9)  Lucentis 0.5 Mg/0.4ml Soln (Ranibizumab) .... Eye Injections Every 12 Weeks  Allergies (verified): 1)  * Vioxx (Rofecoxib) 2)  Cipro 3)  Codeine Phosphate (Codeine Phosphate) 4)  Lipitor (Atorvastatin Calcium) 5)  Simvastatin (Simvastatin)  Past History:  Family History: Last updated: 06/16/2007 Family History Diabetes 1st degree relative Family History Liver disease Family History of Prostate CA 1st degree relative <50 Family History Weight disorder Family History of Cardiovascular disorder  Social History: Last updated: 06/16/2007 Retired Married Current Smoker Alcohol use-yes  Risk Factors: Smoking Status: current (01/06/2010)  Past medical, surgical, family and social histories (including risk factors) reviewed, and no changes noted (except as noted below).  Past Medical History: Reviewed history from 06/20/2007 and no changes required. Rheumatic Fever, hx of Renal Stones GERD Heart Murmur Anemia-NOS Hypertension macular degeneration ( [place]) Hyperlipidemia  Past Surgical History: Reviewed history from 06/16/2007 and no changes required. Cervical Discectomy Cholecystectomy Oral Surgery Knee Cartiledge Surgery Foot Surgery Appendectomy  Family History: Reviewed history from 06/16/2007 and no changes required. Family History Diabetes 1st degree relative Family History Liver disease Family History of Prostate CA 1st degree  relative <50 Family History Weight disorder Family History of Cardiovascular disorder  Social History: Reviewed history from 06/16/2007 and no changes required. Retired Married Current Smoker Alcohol use-yes  Review of Systems  The patient denies anorexia, fever, weight loss, weight gain, vision loss, decreased  hearing, hoarseness, chest pain, syncope, dyspnea on exertion, peripheral edema, prolonged cough, headaches, hemoptysis, abdominal pain, melena, hematochezia, severe indigestion/heartburn, hematuria, incontinence, genital sores, muscle weakness, suspicious skin lesions, transient blindness, difficulty walking, depression, unusual weight change, abnormal bleeding, enlarged lymph nodes, angioedema, breast masses, and testicular masses.    Physical Exam  General:  Well-developed,well-nourished,in no acute distress; alert,appropriate and cooperative throughout examination Head:  normocephalic and atraumatic.   Eyes:  pupils equal and pupils round.   Ears:  R ear normal and L ear normal.   Nose:  no external deformity and no nasal discharge.   Mouth:  pharynx pink and moist and no erythema.   Neck:  No deformities, masses, or tenderness noted. Lungs:  normal respiratory effort and no accessory muscle use.   Heart:  normal rate and regular rhythm.     Impression & Recommendations:  Problem # 1:  HYPERLIPIDEMIA (ICD-272.4)  Labs Reviewed: SGOT: 18 (11/11/2009)   SGPT: 17 (11/11/2009)  Lipid Goals: Chol Goal: 200 (06/20/2007)   HDL Goal: 40 (06/20/2007)   LDL Goal: 100 (06/20/2007)   TG Goal: 150 (06/20/2007)  Prior 10 Yr Risk Heart Disease: 33 % (09/04/2008)   HDL:27.90 (07/18/2009), 36.90 (03/15/2009)  LDL:154 (07/18/2009), DEL (09/04/2008)  Chol:199 (07/18/2009), 215 (03/15/2009)  Trig:84.0 (07/18/2009), 55.0 (03/15/2009)  Problem # 2:  LEG PAIN, RIGHT (ICD-729.5) discusson of pain control INCREASE THE NEUROTIN AND TAKE OVER THE HYDROCODONE  Problem # 3:  DYSTHYMIC DISORDER (ICD-300.4) IMPROVED WITH THE NEUROTIN  Problem # 4:  HYPERTENSION (ICD-401.9) Assessment: Unchanged  BP today: 130/80 Prior BP: 130/70 (12/09/2009)  Prior 10 Yr Risk Heart Disease: 33 % (09/04/2008)  Labs Reviewed: K+: 4.6 (03/15/2009) Creat: : 0.9 (03/15/2009)   Chol: 199 (07/18/2009)   HDL: 27.90  (07/18/2009)   LDL: 154 (07/18/2009)   TG: 84.0 (07/18/2009)  Complete Medication List: 1)  Aspir-81 81 Mg Tbec (Aspirin) .... Take 1 tablet by mouth once a day 2)  Neurpath-b 3-35-2 Mg Tabs (L-methylfolate-b6-b12) .... One by mouth two times a day 3)  Prilosec 20 Mg Cpdr (Omeprazole) .... Take 1 capsule by mouth once a day 4)  Icaps Areds Formula Tabs (Multiple vitamins-minerals) .... 2 once daily 5)  Bl Flax Seed Oil 1000 Mg Caps (Flaxseed (linseed)) .... Once daily 6)  Clobetasol Propionate 0.05 % Soln (Clobetasol propionate) .... Apply to scalp daily as needed 7)  Hydrocodone-acetaminophen 7.5-325 Mg Tabs (Hydrocodone-acetaminophen) .... One by mouth q 6 hours prn 8)  Gabapentin 600 Mg Tabs (Gabapentin) .... One in am, one at 2 pm and 1-2 at bed time 9)  Lucentis 0.5 Mg/0.86ml Soln (Ranibizumab) .... Eye injections every 12 weeks  Patient Instructions: 1)  Please schedule a follow-up appointment in 2 months. 2)  SET TIME LIMITS SAND WEIGHT LIMITS  AND TO NOT "OVER ACHIEVE"   1/2 WEIGHT  SHOT FOR HIGHER  REPS Prescriptions: GABAPENTIN 600 MG TABS (GABAPENTIN) one in am, ONE AT 2 pm AND 1-2 AT BED TIME  #120 x 4   Entered and Authorized by:   Stacie Glaze MD   Signed by:   Stacie Glaze MD on 01/06/2010   Method used:   [facility]        [id number]       [address], [place]  [address]       Ph: [phone] or [phone]       Fax: [phone]   RxID:   [id number]

## 2010-11-06 NOTE — Letter (Signed)
Summary: Records from [facility] 2010 - 2011  Records from [facility] 2010 - 2011   Imported By: Maryln Gottron 11/27/2009 09:27:21  _____________________________________________________________________  External Attachment:    Type:   Image     Comment:   External Document

## 2010-11-06 NOTE — Assessment & Plan Note (Signed)
Summary: 1 month rov/njr   Vital Signs:  Patient profile:   65 year old male Height:      66 inches Weight:      156 pounds BMI:     25.27 Temp:     98.2 degrees F oral Pulse rate:   72 / minute Resp:     14 per minute BP sitting:   130 / 70  (left arm)  Vitals Entered By: Willy Eddy, LPN (December 10, 4538 10:22 AM) CC: roa   CC:  roa.  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) 10)  Leg Pain, Right  (ICD-729.5) 11)  Hyperlipidemia  (ICD-272.4) 12)  Degeneration, 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) 13)  Hypertension  (ICD-401.9) 14)  Family History Diabetes 1st Degree Relative  (ICD-V18.0) 15)  Gerd  (ICD-530.81)  Current Problems (verified): 1)  Uns Advrs Eff Uns Rx Medicinal&biological Sbstnc  (ICD-995.20) 2)  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 Take 1 Tablet By Mouth Once A Day 2)  Glucosamine Sulfate 500 Mg Tabs (Glucosamine Sulfate) .... Take 1 Once A Day 3)  Neurpath-B 3-35-2 Mg Tabs (L-Methylfolate-B6-B12) .... One By Mouth Two Times A Day 4)  Prilosec 20 Mg Cpdr (Omeprazole) .... Take 1 Capsule By Mouth Once A Day 5)  Icaps Areds Formula   Tabs (Multiple Vitamins-Minerals) .... 2 Once Daily 6)  Bl Flax Seed Oil 1000 Mg  Caps (Flaxseed (Linseed)) .... Once Daily 7)  Clobetasol Propionate 0.05 %  Soln (Clobetasol Propionate) .... Apply To Scalp Daily As Needed 8)  Vicodin 5-500 Mg Tabs (Hydrocodone-Acetaminophen) .... Post Surgery- 1 Every 6-8 Hours Prn 9)  Neurontin 300 Mg Caps (Gabapentin) .... One By Mouth Two Times A Day  For Two Week and Increase To Three Times A Day  One in Am and Two At [facility])  Current Medications (verified): 1)  Aspir-81 81 Mg Tbec (Aspirin) .... Take 1 Tablet By Mouth Once A Day 2)  Neurpath-B 3-35-2 Mg Tabs (L-Methylfolate-B6-B12) .... One By Mouth Two Times A Day 3)  Prilosec 20 Mg Cpdr (Omeprazole) .... Take 1 Capsule By Mouth Once A Day  4)  Icaps Areds Formula   Tabs (Multiple Vitamins-Minerals) .... 2 Once Daily 5)  Bl Flax Seed Oil 1000 Mg  Caps (Flaxseed (Linseed)) .... Once Daily 6)  Clobetasol Propionate 0.05 %  Soln (Clobetasol Propionate) .... Apply To Scalp Daily As Needed 7)  Vicodin Es 7.5-750 Mg Tabs (Hydrocodone-Acetaminophen) .Marland Kitchen.. 1 Every 6-8 Hours As Needed Pain 8)  Neurontin 300 Mg Caps (Gabapentin) .... One By Mouth Two Times A Day  For Two Week and Increase To Three Times A Day  One in Am and Two At [facility]) 9)  Lucentis 0.5 Mg/0.60ml Soln (Ranibizumab) .... Eye Injections Every 12 Weeks  Allergies (verified): 1)   * Vioxx (Rofecoxib) 2)  Cipro 3)  Codeine Phosphate (Codeine Phosphate) 4)  Lipitor (Atorvastatin Calcium) 5)  Simvastatin (Simvastatin)  Past History:  Family History: Last updated: 06/16/2007 Family History Diabetes 1st degree relative Family History Liver disease Family History of Prostate CA 1st degree relative <50 Family History Weight disorder Family History of Cardiovascular disorder  Social History: Last updated: 06/16/2007 Retired Married Current Smoker Alcohol use-yes  Risk Factors: Smoking Status: current (12/09/2009)  Past medical, surgical, family and social histories (including risk factors) reviewed, and no changes noted (except as noted below).  Past Medical History: Reviewed history from 06/20/2007 and no changes required. Rheumatic Fever, hx of Renal Stones GERD Heart Murmur Anemia-NOS Hypertension macular degeneration ( [place]) Hyperlipidemia  Past Surgical History: Reviewed history from 06/16/2007 and no changes required. Cervical Discectomy Cholecystectomy Oral Surgery Knee Cartiledge Surgery Foot Surgery Appendectomy  Family History: Reviewed history from 06/16/2007 and no changes required. Family History Diabetes 1st degree relative Family History Liver disease Family History of Prostate CA 1st degree relative <50 Family History Weight disorder Family History of Cardiovascular disorder  Social History: Reviewed history from 06/16/2007 and no changes required. Retired Married Current Smoker Alcohol use-yes  Review of Systems  The patient denies anorexia, fever, weight loss, weight gain, vision loss, decreased hearing, hoarseness, chest pain, syncope, dyspnea on exertion, peripheral edema, prolonged cough, headaches, hemoptysis, abdominal pain, melena, hematochezia, severe indigestion/heartburn, hematuria, incontinence, genital sores, muscle weakness, suspicious skin lesions, transient blindness, difficulty walking, depression,  unusual weight change, abnormal bleeding, enlarged lymph nodes, angioedema, breast masses, and testicular masses.    Physical Exam  General:  Well-developed,well-nourished,in no acute distress; alert,appropriate and cooperative throughout examination Head:  normocephalic and atraumatic.   Eyes:  pupils equal and pupils round.   Ears:  R ear normal and L ear normal.   Nose:  no external deformity and no nasal discharge.   Mouth:  pharynx pink and moist and no erythema.   Neck:  No deformities, masses, or tenderness noted. Lungs:  normal respiratory effort and no accessory muscle use.   Heart:  normal rate and regular rhythm.   Abdomen:  soft and normal bowel sounds.   Msk:  decreased ROM, joint tenderness, and joint swelling.  surgical changes Extremities:  trace left pedal edema and trace right pedal edema.     Impression & Recommendations:  Problem # 1:  HYPERTENSION (ICD-401.9)  BP today: 130/70 Prior BP: 136/74 (11/11/2009)  Prior 10 Yr Risk Heart Disease: 33 % (09/04/2008)  Labs Reviewed: K+: 4.6 (03/15/2009) Creat: : 0.9 (03/15/2009)   Chol: 199 (07/18/2009)   HDL: 27.90 (07/18/2009)   LDL: 154 (07/18/2009)   TG: 84.0 (07/18/2009)  Problem # 2:  DYSTHYMIC DISORDER (ICD-300.4) the neurotin should help  Problem # 3:  LEG PAIN, RIGHT (ICD-729.5) the neurotin is at a  low dose and doing well  Complete Medication List: 1)  Aspir-81 81 Mg Tbec (Aspirin) .... Take 1 tablet by mouth once a day 2)  Neurpath-b 3-35-2 Mg Tabs (L-methylfolate-b6-b12) .... One by mouth two times a day 3)  Prilosec 20 Mg Cpdr (Omeprazole) .... Take 1 capsule by mouth once a day 4)  Icaps Areds Formula Tabs (Multiple vitamins-minerals) .... 2 once daily 5)  Bl Flax Seed Oil 1000 Mg Caps (Flaxseed (linseed)) .... Once daily 6)  Clobetasol Propionate 0.05 % Soln (Clobetasol propionate) .... Apply to scalp daily as needed 7)  Vicodin Es 7.5-750 Mg Tabs (Hydrocodone-acetaminophen) .Marland Kitchen.. 1 every 6-8 hours  as needed pain 8)  Neurontin 300 Mg Caps (Gabapentin) .... One by mouth two times a day  for two week and increase to three times a day  one in am and two at hs) 9)  Lucentis 0.5 Mg/0.53ml Soln (Ranibizumab) .... Eye injections every 12 weeks  Patient Instructions: 1)  machine upper body only 2)  Please schedule a follow-up appointment in 1 month.

## 2010-11-06 NOTE — Progress Notes (Signed)
Summary: Call-A-Nurse Report  Phone Note Call from Patient   Summary of Call: check e-chart and pt is in emergency room now Initial call taken by: Willy Eddy, LPN,  June 30, 2010 9:25 AM      Call-A-Nurse Triage Call Report Triage Record Num: [id number] Operator: Migdalia Dk Patient Name: [name] Call Date & Time: 06/30/2010 5:59:06AM Patient Phone: [phone] PCP: Darryll Capers Patient Gender: Male PCP Fax : [phone] Patient DOB: 11-05-1945 [facility] Reason for Call: Wife states pt. with ongoing health issues. States was being weaned off Neurontin and had been tried on Nucynta, but states Nucynta caused dizziness and was taken off that medication. Recently had tooth extracted and is currently on Cephalexin. States has not slept well in 6 months and now over past 3-4 days having Flu-Like symptoms with continous body aches, some postnasal drainage, chills, ear pain. Also with new onset of confusion, "he is also saying some not so nice things", pt. talking about harming self. Wife advised to take pt. to [facility] ER now for evaluation. Protocol(s) Used: Flu-Like Symptoms Recommended Outcome per Protocol: See ED Immediately Reason for Outcome: Sudden change in mental status Care Advice:  ~ 06/30/2010 6:12:33AM Page 1 of 1 [email or web address]  Appended Document: Call-A-Nurse Report Call me back [phone] Galvin Proffer.  To ER & was told problem due to coming off neurontin too quickly  & interaction with new drug Dr. Shela Commons gave & stress & dental work extraction causing head & ear pain.  Hydrocodone & Ambien given.  Medical tests done negative.   To see Dr. Lovell Sheehan 2-3 days.  Please call me back with appointment.   Appended Document: Call-A-Nurse Report pt had been informed not to start titrating off until [place] fo fear this would happen and pt did not follow instructions  Appended Document: Call-A-Nurse Report ov given for frday 930

## 2010-11-06 NOTE — Progress Notes (Signed)
Summary: appt with Dr. Ernst Bowler  Medications Added HYDROCODONE-ACETAMINOPHEN 5-500 MG  TABS (HYDROCODONE-ACETAMINOPHEN) one by mouth 6-8 hours as needed pain       Phone Note Call from Patient Call back at Home Phone [phone]   Caller: patient wife live Call For: Lovell Sheehan Summary of Call: Has been in pain with his back for 5 weeks.  Seen Lovell Sheehan once and has been in the ER twice.  He needs to see someone who can diagnose and get him some pain relief.  He is getting depressed.   Initial call taken by: Roselle Locus,  November 25, 2007 8:14 AM  Follow-up for Phone Call        Spoke to pt's wife.  He is take Hydrocodone 5/500 that helps some but he only has 5 left.  Would like a refill on this.  He is very frustrated with the whole situation and wants to get a diagnosis, and begin some treatment. Does not eat or sleep well.  Any ideas? Advised them that Patsy Lager would call as soon as she has this appt scheduled.  [facility] Follow-up by: Lynann Beaver CMA,  November 25, 2007 8:47 AM  Additional Follow-up for Phone Call Additional follow up Details #1::        may refill this medicaton  Additional Follow-up by: Stacie Glaze MD,  November 25, 2007 10:25 AM    New/Updated Medications: HYDROCODONE-ACETAMINOPHEN 5-500 MG  TABS (HYDROCODONE-ACETAMINOPHEN) one by mouth 6-8 hours as needed pain   Prescriptions: HYDROCODONE-ACETAMINOPHEN 5-500 MG  TABS (HYDROCODONE-ACETAMINOPHEN) one by mouth 6-8 hours as needed pain  #30 x 0   Entered by:   Lynann Beaver CMA   Authorized by:   Alita Chyle Triage Nurse   Signed by:   Lynann Beaver CMA on 11/25/2007   Method used:   Print then Give to Patient   RxID:   [id number]   [facility]

## 2010-11-06 NOTE — Assessment & Plan Note (Signed)
Summary: follow up from hosp/[facility]  Medications Added IBUPROFEN 800 MG  TABS (IBUPROFEN) every 8 hrs as needed VALIUM 5 MG  TABS (DIAZEPAM) every 8 hours as needed VICODIN 5-500 MG  TABS (HYDROCODONE-ACETAMINOPHEN) every 8 hours as needed CHANTIX STARTING MONTH PAK 0.5 MG X 11 & 1 MG X 42  MISC (VARENICLINE TARTRATE) as directed CHANTIX CONTINUING MONTH PAK 1 MG  TABS (VARENICLINE TARTRATE) one by mouth BID CLOBETASOL PROPIONATE 0.05 %  SOLN (CLOBETASOL PROPIONATE) apply to scalp daily as needed DARVOCET-N 100 100-650 MG  TABS (PROPOXYPHENE N-APAP) one by mouth TID SKELAXIN 800 MG  TABS (METAXALONE) one by mouth three times a day        Vital Signs:  Patient Profile:   65 Years Old Male Height:     5.6 inches Weight:      172 pounds Temp:     98.4 degrees F oral Pulse rate:   76 / minute Resp:     14 per minute BP sitting:   130 / 78  (left arm)  Vitals Entered By: Willy Eddy, LPN (November 07, 2007 2:14 PM)                 Chief Complaint:  to emergency [facility]on on january 19 for low back pain/rx with vicodan and valium and ibuprofen.  History of Present Illness: Went to the Er for back pain... plain films were normal Mid low back pressure seems to help and it is 80 % better the valium helped the IBU and the vicodin helped feels a pop and it improved No radicular pain the pain patern has a catch to it   Current Allergies (reviewed today): * VIOXX (ROFECOXIB) CIPRO (CIPROFLOXACIN) CODEINE PHOSPHATE (CODEINE PHOSPHATE) LIPITOR (ATORVASTATIN CALCIUM) SIMVASTATIN (SIMVASTATIN) Updated/Current Medications (including changes made in today's visit):  ADVAIR DISKUS 100-50 MCG/DOSE MISC (FLUTICASONE-SALMETEROL) Inhale 1 puff as directed twice a day ASPIR-81 81 MG TBEC (ASPIRIN) Take 1 tablet by mouth once a day GLUCOSAMINE SULFATE 500 MG TABS (GLUCOSAMINE SULFATE) Take 1 once a day METANX 2.8-25-2 MG TABS (L-METHYLFOLATE-B6-B12) Take 1 once a day PRILOSEC 20 MG CPDR  (OMEPRAZOLE) Take 1 capsule by mouth once a day ICAPS AREDS FORMULA   TABS (MULTIPLE VITAMINS-MINERALS) 2 once daily BL FLAX SEED OIL 1000 MG  CAPS (FLAXSEED (LINSEED)) once daily OMEGA-3 350 MG  CAPS (OMEGA-3 FATTY ACIDS) once daily IBUPROFEN 800 MG  TABS (IBUPROFEN) every 8 hrs as needed VALIUM 5 MG  TABS (DIAZEPAM) every 8 hours as needed VICODIN 5-500 MG  TABS (HYDROCODONE-ACETAMINOPHEN) every 8 hours as needed CHANTIX STARTING MONTH PAK 0.5 MG X 11 & 1 MG X 42  MISC (VARENICLINE TARTRATE) as directed CHANTIX CONTINUING MONTH PAK 1 MG  TABS (VARENICLINE TARTRATE) one by mouth BID CLOBETASOL PROPIONATE 0.05 %  SOLN (CLOBETASOL PROPIONATE) apply to scalp daily as needed DARVOCET-N 100 100-650 MG  TABS (PROPOXYPHENE N-APAP) one by mouth TID SKELAXIN 800 MG  TABS (METAXALONE) one by mouth three times a day    Family History:    Reviewed history from 06/16/2007 and no changes required:       Family History Diabetes 1st degree relative       Family History Liver disease       Family History of Prostate CA 1st degree relative <50       Family History Weight disorder       Family History of Cardiovascular disorder  Social History:    Reviewed history from 06/16/2007 and no  changes required:       Retired       Married       Current Smoker       Alcohol use-yes    Review of Systems  The patient denies anorexia, fever, weight loss, vision loss, decreased hearing, hoarseness, chest pain, syncope, dyspnea on exhertion, peripheral edema, hemoptysis, abdominal pain, melena, hematochezia, and severe indigestion/heartburn.     Physical Exam  General:     Well-developed,well-nourished,in no acute distress; alert,appropriate and cooperative throughout examination Head:     normocephalic, atraumatic, and male-pattern balding.   Nose:     External nasal examination shows no deformity or inflammation. Nasal mucosa are pink and moist without lesions or exudates. Mouth:     Oral mucosa and  oropharynx without lesions or exudates.  Teeth in good repair. Neck:     No deformities, masses, or tenderness noted. Chest Wall:     No deformities, masses, tenderness or gynecomastia noted. Lungs:     Normal respiratory effort, chest expands symmetrically. Lungs are clear to auscultation, no crackles or wheezes. Heart:     normal rate, regular rhythm, and no murmur.   Abdomen:     Bowel sounds positive,abdomen soft and non-tender without masses, organomegaly or hernias noted. Msk:     lumbar lordosis, SI joint tenderness, and trigger point tenderness.   Pulses:     R and L carotid,radial,femoral,dorsalis pedis and posterior tibial pulses are full and equal bilaterally Extremities:     No clubbing, cyanosis, edema, or deformity noted with normal full range of motion of all joints.   Neurologic:     No cranial nerve deficits noted. Station and gait are normal. Plantar reflexes are down-going bilaterally. DTRs are symmetrical throughout. Sensory, motor and coordinative functions appear intact. Psych:     moderately anxious.      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) clobetrasol rx for seborhea  Problem # 3:  LUMBAR SPRAIN AND STRAIN (ICD-847.2) rx for darvocet and skelazin and discussion of heat and exercizes for low back pain  Problem # 4:  HYPERLIPIDEMIA (ICD-272.4)  Labs Reviewed: Chol: 248 (06/20/2007)   HDL: 33.6 (06/20/2007)   LDL: DEL (06/20/2007)   TG: 69 (06/20/2007) SGOT: 28 (03/10/2007)   SGPT: 27 (03/10/2007)  Lipid Goals: Chol Goal: 200 (06/20/2007)   HDL Goal: 40 (06/20/2007)   LDL Goal: 100 (06/20/2007)   TG Goal: 150 (06/20/2007)  Prior 10 Yr Risk Heart Disease: Not enough information (06/20/2007)   Problem #  5:  LEG PAIN, RIGHT (ICD-729.5) due to back injury  Problem # 6:  GERD (ICD-530.81)  His updated medication list for this problem includes:    Prilosec 20 Mg Cpdr (Omeprazole) .Marland Kitchen... Take 1 capsule by mouth once a day  Diagnostics Reviewed:  Discussed lifestyle modifications, diet, antacids/medications, and preventive measures. Handout provided.   Complete Medication List: 1)  Advair Diskus 100-50 Mcg/dose Misc (Fluticasone-salmeterol) .... Inhale 1 puff as directed twice a day 2)  Aspir-81 81 Mg Tbec (Aspirin) .... Take 1 tablet by mouth once a day 3)  Glucosamine Sulfate 500 Mg Tabs (Glucosamine sulfate) .... Take 1 once a day 4)  Metanx 2.8-25-2 Mg Tabs (L-methylfolate-b6-b12) .... Take 1 once a day 5)  Prilosec 20 Mg Cpdr (Omeprazole) .Marland KitchenMarland KitchenMarland Kitchen  Take 1 capsule by mouth once a day 6)  Icaps Areds Formula Tabs (Multiple vitamins-minerals) .... 2 once daily 7)  Bl Flax Seed Oil 1000 Mg Caps (Flaxseed (linseed)) .... Once daily 8)  Omega-3 350 Mg Caps (Omega-3 fatty acids) .... Once daily 9)  Ibuprofen 800 Mg Tabs (Ibuprofen) .... Every 8 hrs as needed 10)  Valium 5 Mg Tabs (Diazepam) .... Every 8 hours as needed 11)  Vicodin 5-500 Mg Tabs (Hydrocodone-acetaminophen) .... Every 8 hours as needed 12)  Chantix Starting Month Pak 0.5 Mg X 11 & 1 Mg X 42 Misc (Varenicline tartrate) .... As directed 13)  Chantix Continuing Month Pak 1 Mg Tabs (Varenicline tartrate) .... One by mouth bid 14)  Clobetasol Propionate 0.05 % Soln (Clobetasol propionate) .... Apply to scalp daily as needed 15)  Darvocet-n 100 100-650 Mg Tabs (Propoxyphene n-apap) .... One by mouth tid 16)  Skelaxin 800 Mg Tabs (Metaxalone) .... One by mouth three times a day   Patient Instructions: 1)  keep CPX apointment    Prescriptions: SKELAXIN 800 MG  TABS (METAXALONE) one by mouth three times a day  #45 x 0   Entered and Authorized by:   Stacie Glaze MD   Signed by:   Stacie Glaze MD on 11/07/2007   Method used:    Print then Give to Patient   RxID:   [id number] DARVOCET-N 100 100-650 MG  TABS (PROPOXYPHENE N-APAP) one by mouth TID  #90 x 0   Entered and Authorized by:   Stacie Glaze MD   Signed by:   Stacie Glaze MD on 11/07/2007   Method used:   Print then Give to Patient   RxID:   [id number] CLOBETASOL PROPIONATE 0.05 %  SOLN (CLOBETASOL PROPIONATE) apply to scalp daily as needed  #50cc x 11   Entered and Authorized by:   Stacie Glaze MD   Signed by:   Stacie Glaze MD on 11/07/2007   Method used:   Print then Give to Patient   RxID:   [id number] CHANTIX CONTINUING MONTH PAK 1 MG  TABS (VARENICLINE TARTRATE) one by mouth BID  #60 x 4   Entered and Authorized by:   Stacie Glaze MD   Signed by:   Stacie Glaze MD on 11/07/2007   Method used:   Print then Give to Patient   RxID:   [id number] CHANTIX STARTING MONTH PAK 0.5 MG X 11 & 1 MG X 42  MISC (VARENICLINE TARTRATE) as directed  #one pk x 0   Entered and Authorized by:   Stacie Glaze MD   Signed by:   Stacie Glaze MD on 11/07/2007   Method used:   Print then Give to Patient   RxID:   [id number]  ]

## 2010-12-18 LAB — CULTURE, BLOOD (ROUTINE X 2)
Culture  Setup Time: 201109261423
Culture  Setup Time: 201109261423
Culture: NO GROWTH
Culture: NO GROWTH

## 2010-12-18 LAB — COMPREHENSIVE METABOLIC PANEL
ALT: 16 U/L (ref 0–53)
AST: 20 U/L (ref 0–37)
Albumin: 4 g/dL (ref 3.5–5.2)
Alkaline Phosphatase: 76 U/L (ref 39–117)
BUN: 9 mg/dL (ref 6–23)
CO2: 23 mEq/L (ref 19–32)
Calcium: 9.7 mg/dL (ref 8.4–10.5)
Chloride: 103 mEq/L (ref 96–112)
Creatinine, Ser: 0.82 mg/dL (ref 0.4–1.5)
GFR calc non Af Amer: >60 mL/min (ref >60)
Glucose, Bld: 97 mg/dL (ref 70–99)
Potassium: 4.3 mEq/L (ref 3.5–5.1)
Sodium: 134 mEq/L — ABNORMAL LOW (ref 135–145)
Total Bilirubin: 1.1 mg/dL (ref 0.3–1.2)
Total Protein: 7.3 g/dL (ref 6.0–8.3)

## 2010-12-18 LAB — DIFFERENTIAL
Basophils Absolute: 0 10*3/uL (ref 0.0–0.1)
Basophils Relative: 0 % (ref 0–1)
Eosinophils Absolute: 0.1 10*3/uL (ref 0.0–0.7)
Eosinophils Relative: 1 % (ref 0–5)
Lymphocytes Relative: 17 % (ref 12–46)
Lymphs Abs: 1.6 10*3/uL (ref 0.7–4.0)
Monocytes Absolute: 0.5 10*3/uL (ref 0.1–1.0)
Monocytes Relative: 6 % (ref 3–12)
Neutro Abs: 7.1 10*3/uL (ref 1.7–7.7)
Neutrophils Relative %: 76 % (ref 43–77)

## 2010-12-18 LAB — CBC
HCT: 43.7 % (ref 39.0–52.0)
Hemoglobin: 16.1 g/dL (ref 13.0–17.0)
MCH: 31.9 pg (ref 26.0–34.0)
MCHC: 36.8 g/dL — ABNORMAL HIGH (ref 30.0–36.0)
MCV: 86.7 fL (ref 78.0–100.0)
Platelets: 169 10*3/uL (ref 150–400)
RBC: 5.04 MIL/uL (ref 4.22–5.81)
RDW: 12.7 % (ref 11.5–15.5)
WBC: 9.4 10*3/uL (ref 4.0–10.5)

## 2010-12-18 LAB — URINALYSIS, ROUTINE W REFLEX MICROSCOPIC
Bilirubin Urine: NEGATIVE
Glucose, UA: NEGATIVE mg/dL
Hgb urine dipstick: NEGATIVE
Ketones, ur: NEGATIVE mg/dL
Nitrite: NEGATIVE
Protein, ur: NEGATIVE mg/dL
Specific Gravity, Urine: 1.016 (ref 1.005–1.030)
Urobilinogen, UA: 1 mg/dL (ref 0.0–1.0)
pH: 7 (ref 5.0–8.0)

## 2010-12-18 LAB — ETHANOL

## 2010-12-18 LAB — LACTIC ACID, PLASMA: Lactic Acid, Venous: 0.8 mmol/L (ref 0.5–2.2)

## 2011-01-07 LAB — BASIC METABOLIC PANEL
BUN: 6 mg/dL (ref 6–23)
BUN: 9 mg/dL (ref 6–23)
CO2: 27 mEq/L (ref 19–32)
CO2: 30 mEq/L (ref 19–32)
Calcium: 8.3 mg/dL — ABNORMAL LOW (ref 8.4–10.5)
Calcium: 8.9 mg/dL (ref 8.4–10.5)
Chloride: 100 mEq/L (ref 96–112)
Chloride: 102 mEq/L (ref 96–112)
Creatinine, Ser: 0.85 mg/dL (ref 0.4–1.5)
Creatinine, Ser: 0.9 mg/dL (ref 0.4–1.5)
GFR calc Af Amer: >60 mL/min (ref >60)
GFR calc Af Amer: >60 mL/min (ref >60)
GFR calc non Af Amer: >60 mL/min (ref >60)
GFR calc non Af Amer: >60 mL/min (ref >60)
Glucose, Bld: 105 mg/dL — ABNORMAL HIGH (ref 70–99)
Glucose, Bld: 114 mg/dL — ABNORMAL HIGH (ref 70–99)
Potassium: 3.8 mEq/L (ref 3.5–5.1)
Potassium: 4.1 mEq/L (ref 3.5–5.1)
Sodium: 133 mEq/L — ABNORMAL LOW (ref 135–145)
Sodium: 135 mEq/L (ref 135–145)

## 2011-01-07 LAB — COMPREHENSIVE METABOLIC PANEL
ALT: 20 U/L (ref 0–53)
AST: 22 U/L (ref 0–37)
Albumin: 3.8 g/dL (ref 3.5–5.2)
Alkaline Phosphatase: 75 U/L (ref 39–117)
BUN: 12 mg/dL (ref 6–23)
CO2: 29 mEq/L (ref 19–32)
Calcium: 9.7 mg/dL (ref 8.4–10.5)
Chloride: 102 mEq/L (ref 96–112)
Creatinine, Ser: 0.9 mg/dL (ref 0.4–1.5)
GFR calc Af Amer: >60 mL/min (ref >60)
GFR calc non Af Amer: >60 mL/min (ref >60)
Glucose, Bld: 77 mg/dL (ref 70–99)
Potassium: 4.9 mEq/L (ref 3.5–5.1)
Sodium: 138 mEq/L (ref 135–145)
Total Bilirubin: 1 mg/dL (ref 0.3–1.2)
Total Protein: 7.4 g/dL (ref 6.0–8.3)

## 2011-01-07 LAB — CBC
HCT: 26.6 % — ABNORMAL LOW (ref 39.0–52.0)
HCT: 29.9 % — ABNORMAL LOW (ref 39.0–52.0)
HCT: 30.2 % — ABNORMAL LOW (ref 39.0–52.0)
HCT: 42.3 % (ref 39.0–52.0)
Hemoglobin: 10.4 g/dL — ABNORMAL LOW (ref 13.0–17.0)
Hemoglobin: 10.4 g/dL — ABNORMAL LOW (ref 13.0–17.0)
Hemoglobin: 14.9 g/dL (ref 13.0–17.0)
Hemoglobin: 9.4 g/dL — ABNORMAL LOW (ref 13.0–17.0)
MCHC: 34.6 g/dL (ref 30.0–36.0)
MCHC: 34.9 g/dL (ref 30.0–36.0)
MCHC: 35.3 g/dL (ref 30.0–36.0)
MCHC: 35.5 g/dL (ref 30.0–36.0)
MCV: 92.9 fL (ref 78.0–100.0)
MCV: 93.6 fL (ref 78.0–100.0)
MCV: 93.7 fL (ref 78.0–100.0)
MCV: 93.8 fL (ref 78.0–100.0)
Platelets: 127 10*3/uL — ABNORMAL LOW (ref 150–400)
Platelets: 133 10*3/uL — ABNORMAL LOW (ref 150–400)
Platelets: 134 10*3/uL — ABNORMAL LOW (ref 150–400)
Platelets: 174 10*3/uL (ref 150–400)
RBC: 2.86 MIL/uL — ABNORMAL LOW (ref 4.22–5.81)
RBC: 3.19 MIL/uL — ABNORMAL LOW (ref 4.22–5.81)
RBC: 3.22 MIL/uL — ABNORMAL LOW (ref 4.22–5.81)
RBC: 4.51 MIL/uL (ref 4.22–5.81)
RDW: 11.8 % (ref 11.5–15.5)
RDW: 12.3 % (ref 11.5–15.5)
RDW: 12.5 % (ref 11.5–15.5)
RDW: 13 % (ref 11.5–15.5)
WBC: 11.8 10*3/uL — ABNORMAL HIGH (ref 4.0–10.5)
WBC: 12.9 10*3/uL — ABNORMAL HIGH (ref 4.0–10.5)
WBC: 7.6 10*3/uL (ref 4.0–10.5)
WBC: 9.4 10*3/uL (ref 4.0–10.5)

## 2011-01-07 LAB — URINALYSIS, ROUTINE W REFLEX MICROSCOPIC
Bilirubin Urine: NEGATIVE
Glucose, UA: NEGATIVE mg/dL
Hgb urine dipstick: NEGATIVE
Ketones, ur: NEGATIVE mg/dL
Nitrite: NEGATIVE
Protein, ur: NEGATIVE mg/dL
Specific Gravity, Urine: 1.014 (ref 1.005–1.030)
Urobilinogen, UA: 0.2 mg/dL (ref 0.0–1.0)
pH: 7 (ref 5.0–8.0)

## 2011-01-07 LAB — PROTIME-INR
INR: 1.01 (ref 0.00–1.49)
INR: 1.19 (ref 0.00–1.49)
INR: 1.8 — ABNORMAL HIGH (ref 0.00–1.49)
INR: 2.63 — ABNORMAL HIGH (ref 0.00–1.49)
Prothrombin Time: 13.2 seconds (ref 11.6–15.2)
Prothrombin Time: 15 seconds (ref 11.6–15.2)
Prothrombin Time: 20.7 seconds — ABNORMAL HIGH (ref 11.6–15.2)
Prothrombin Time: 27.9 seconds — ABNORMAL HIGH (ref 11.6–15.2)

## 2011-01-07 LAB — TYPE AND SCREEN
ABO/RH(D): O POS
Antibody Screen: POSITIVE
DAT, IgG: NEGATIVE

## 2011-01-07 LAB — APTT: aPTT: 34 seconds (ref 24–37)

## 2011-01-27 ENCOUNTER — Encounter: Payer: Self-pay | Admitting: Internal Medicine

## 2011-02-04 ENCOUNTER — Ambulatory Visit: Payer: Self-pay | Admitting: Internal Medicine

## 2011-02-09 ENCOUNTER — Ambulatory Visit (INDEPENDENT_AMBULATORY_CARE_PROVIDER_SITE_OTHER): Payer: BC Managed Care – PPO | Admitting: Internal Medicine

## 2011-02-09 ENCOUNTER — Encounter: Payer: Self-pay | Admitting: Internal Medicine

## 2011-02-09 VITALS — BP 120/70 | HR 72 | Temp 98.2°F | Resp 14 | Ht 66.0 in | Wt 152.0 lb

## 2011-02-09 DIAGNOSIS — R634 Abnormal weight loss: Secondary | ICD-10-CM

## 2011-02-09 DIAGNOSIS — K219 Gastro-esophageal reflux disease without esophagitis: Secondary | ICD-10-CM

## 2011-02-09 DIAGNOSIS — F17200 Nicotine dependence, unspecified, uncomplicated: Secondary | ICD-10-CM

## 2011-02-09 DIAGNOSIS — I10 Essential (primary) hypertension: Secondary | ICD-10-CM

## 2011-02-09 DIAGNOSIS — E785 Hyperlipidemia, unspecified: Secondary | ICD-10-CM

## 2011-02-09 DIAGNOSIS — R0789 Other chest pain: Secondary | ICD-10-CM

## 2011-02-09 NOTE — Progress Notes (Signed)
  Subjective:    Patient ID: Ryan [name], male    DOB: 1945/11/26, 65 y.o.   MRN: [id number]  HPI Easy bruising on the arms Weight loss stable Palpitations and fatigue in the afternoon Sensation of "pounding" heart beats ( no rapid beats) These respond to relaxation Last stress test was greater that five years The pt has issues with the knee replacement and less mobility due to pain   Review of Systems  Constitutional: Positive for activity change. Negative for fever and fatigue.  HENT: Negative for hearing loss, congestion, neck pain and postnasal drip.   Eyes: Negative for discharge, redness and visual disturbance.  Respiratory: Positive for chest tightness and shortness of breath. Negative for cough and wheezing.   Cardiovascular: Negative for leg swelling.  Gastrointestinal: Negative for abdominal pain, constipation and abdominal distention.  Genitourinary: Negative for urgency and frequency.  Musculoskeletal: Negative for joint swelling and arthralgias.  Skin: Negative for color change and rash.  Neurological: Negative for weakness and light-headedness.  Hematological: Negative for adenopathy.  Psychiatric/Behavioral: Negative for behavioral problems.       Past Medical History  Diagnosis Date  . H/O: rheumatic fever   . Kidney stones   . GERD (gastroesophageal reflux disease)   . Heart murmur   . Anemia   . Hypertension   . Macular degeneration   . Hyperlipidemia    Past Surgical History  Procedure Date  . Cervical discectomy   . Cholecystectomy   . Mouth surgery   . Knee cartiledge   . Foot surgery   . Appendectomy     reports that he has been smoking.  He does not have any smokeless tobacco history on file. He reports that he drinks alcohol. He reports that he does not use illicit drugs. family history includes Arthritis in his sister; Cancer in his father; Cirrhosis in his mother; Coronary artery disease in an unspecified family member; Diabetes in his  mother; Heart disease in his brother, father, mother, and sister; Liver disease in an unspecified family member; and Prostate cancer in an unspecified family member. Allergies  Allergen Reactions  . Atorvastatin     REACTION: unspecified  . Ciprofloxacin     REACTION: unspecified  . Codeine Phosphate     REACTION: unspecified  . Simvastatin     REACTION: unspecified    Objective:   Physical Exam  Constitutional: He appears well-developed and well-nourished.  HENT:  Head: Normocephalic and atraumatic.  Eyes: Conjunctivae are normal. Pupils are equal, round, and reactive to light.  Neck: Normal range of motion. Neck supple.  Cardiovascular: Normal rate and regular rhythm.   Pulmonary/Chest: Effort normal and breath sounds normal.  Abdominal: Soft. Bowel sounds are normal.  Neurological: He is alert.  Skin: Skin is warm and dry.          Assessment & Plan:  Patient has cardiovascular symptoms of pounding in her chest associated with fatigue strong family history of heart disease he continues to smoke and his risk factors are strong enough that we should pursue cardiovascular testing I believe we should do a chemical stress Myoview (due to his knee replacement) and consider cardiology referral if the test is nondiagnostic. However low threshold given his family history of proceeding to cardiac catheterization if the pain persists and the Cardiolite is negative.

## 2011-02-17 NOTE — Assessment & Plan Note (Signed)
[facility]                                 ON-CALL NOTE   NAME:GREENEColton, Tassin                         MRN:          [id number]  DATE:02/26/2007                            DOB:          01-Mar-1946    His wife called, name [name], and she called him Shon Hale, so it might be  Colvin Caroli.  Phone number [phone].  Patient of Dr. Lovell Sheehan.  Phone call  at 9:01 a.m. on May 24th.  Mr. Chilton Si has been sick with a bronchitis and  has required several changes of antibiotic.  He still feels kind of  tired and is just kind of laying around.  His wife says that he is not  really short of breath and it is hard to tell whether the infection is  worse, but she says he just has no energy, and then mentions depression.  He has been talking about dying although with no suicidal ideation, but  she says it is because he has just been laying around and sick.  Apparently, there is no history of depression.   PLAN:  She is going to bring him in for evaluation at 12:45 today.  I am  not sure whether there is an infectious process still, or whether there  might be a primarily affective process.     Karie Schwalbe, MD  Electronically Signed    RIL/MedQ  DD: 02/26/2007  DT: 02/26/2007  Job #: [id number]

## 2011-02-18 ENCOUNTER — Ambulatory Visit (HOSPITAL_COMMUNITY): Payer: BC Managed Care – PPO | Attending: Internal Medicine | Admitting: Radiology

## 2011-02-18 DIAGNOSIS — I4949 Other premature depolarization: Secondary | ICD-10-CM

## 2011-02-18 DIAGNOSIS — R0989 Other specified symptoms and signs involving the circulatory and respiratory systems: Secondary | ICD-10-CM

## 2011-02-18 DIAGNOSIS — R0609 Other forms of dyspnea: Secondary | ICD-10-CM

## 2011-02-18 DIAGNOSIS — R079 Chest pain, unspecified: Secondary | ICD-10-CM | POA: Insufficient documentation

## 2011-02-18 MED ORDER — TECHNETIUM TC 99M TETROFOSMIN IV KIT
11.0000 | PACK | Freq: Once | INTRAVENOUS | Status: AC | PRN
  Administered 2011-02-18: 11 via INTRAVENOUS

## 2011-02-18 MED ORDER — TECHNETIUM TC 99M TETROFOSMIN IV KIT
33.0000 | PACK | Freq: Once | INTRAVENOUS | Status: AC | PRN
  Administered 2011-02-18: 33 via INTRAVENOUS

## 2011-02-18 MED ORDER — REGADENOSON 0.4 MG/5ML IV SOLN
0.4000 mg | Freq: Once | INTRAVENOUS | Status: AC
  Administered 2011-02-18: 0.4 mg via INTRAVENOUS

## 2011-02-18 MED ORDER — AMINOPHYLLINE 25 MG/ML IV SOLN
75.0000 mg | INTRAVENOUS | Status: AC
  Administered 2011-02-18: 75 mg via INTRAVENOUS

## 2011-02-18 NOTE — Progress Notes (Signed)
[facility] SITE 3 NUCLEAR MED [address] [name] [address] [phone]  Cardiology Nuclear Med Study  Lionardo Worthington [name] is a 65 y.o. male [id number] [birth year]   Nuclear Med Background Indication for Stress Test:  Evaluation for Ischemia History:  No previous documented CAD Cardiac Risk Factors: Family History - CAD, Hypertension, Lipids and Smoker  Symptoms:  Chest Pain   Nuclear Pre-Procedure Caffeine/Decaff Intake:  None NPO After: 7:30pm   Lungs:  clear IV 0.9% NS with Angio Cath:  18g  IV Site: R Antecubital  IV Started by:  Stanton Kidney, EMT-P  Chest Size (in):  40 Cup Size: n/a  Height: 5\' 6"  (1.676 m)  Weight:  151 lb (68.493 kg)  BMI:  Body mass index is 24.37 kg/(m^2). Tech Comments:  This patient remained very symptomatic after the Lexiscan. He was given 75 mg of Aminophylline with total relief.    Nuclear Med Study 1 or 2 day study: 1 day  Stress Test Type:  [name]  Reading MD: Ryan Meres, MD  Order Authorizing Provider:  J.Jenkins  Resting Radionuclide: Technetium 42m Tetrofosmin  Resting Radionuclide Dose: 11.0 mCi   Stress Radionuclide:  Technetium 80m Tetrofosmin  Stress Radionuclide Dose: 33.0 mCi           Stress Protocol Rest HR: 61 Stress HR: 82  Rest BP: 128/74 Stress BP: 131/61  Exercise Time (min): n/a METS: n/a   Predicted Max HR: 156 bpm % Max HR: 52.56 bpm Rate Pressure Product: 51884   Dose of Adenosine (mg):  n/a Dose of Lexiscan: 0.4 mg  Dose of Atropine (mg): n/a Dose of Dobutamine: n/a mcg/kg/min (at max HR)  Stress Test Technologist: Milana Na, EMT-P  Nuclear Technologist:  Domenic Polite, CNMT     Rest Procedure:  Myocardial perfusion imaging was performed at rest 45 minutes following the intravenous administration of Technetium 65m Tetrofosmin. Rest ECG: NSR  Stress Procedure:  The patient received IV Lexiscan 0.4 mg over 15-seconds.  Technetium 65m Tetrofosmin injected at 30-seconds.   There were non specific changes and occ pvcs with Lexiscan.  Quantitative spect images were obtained after a 45 minute delay. Stress ECG: No significant change from baseline ECG  QPS Raw Data Images:  There is interference from nuclear activity from structures below the diaphragm.  This limits the ability to evaluate the inferior wall.  Stress Images:  There is decreased uptake in the inferior wall. Rest Images:  There is decreased uptake in the inferior wall. Subtraction (SDS):  There is significant gut atttenuation over the inferior wall worse at rest than stress. The inferior wall is unable to be evaluated fully but I suspect it is OK. The remainder of the perfusion is normal. Transient Ischemic Dilatation (Normal <1.22):  1.04  Lung/Heart Ratio (Normal <0.45):  .29  Quantitative Gated Spect Images QGS EDV:  106 ml QGS ESV:  53  ml QGS cine images:  NL LV Function; NL Wall Motion QGS EF: 50%  Impression Exercise Capacity:  Lexiscan with no exercise. BP Response:  n/a Clinical Symptoms:  N/a ECG Impression:  No significant ECG changes with Lexiscan. Comparison with Prior Nuclear Study: No images to compare  Overall Impression:  Probably normal stress nuclear study. There is significant gut atttenuation over the inferior wall worse at rest than stress. The inferior wall is unable to be evaluated fully but I suspect it is OK. The remainder of the perfusion is normal.    Ryan [name]

## 2011-02-19 ENCOUNTER — Telehealth: Payer: Self-pay | Admitting: *Deleted

## 2011-02-19 NOTE — Progress Notes (Signed)
Copy routed to Dr. Lovell Sheehan.Mirna Mires

## 2011-02-19 NOTE — Telephone Encounter (Signed)
RESULTS NOT BACK YET

## 2011-02-19 NOTE — Telephone Encounter (Signed)
Pt. Would like results of his chemical stress Myoview, please.

## 2011-02-20 NOTE — Op Note (Signed)
Ryan [name], Ryan [name]                          ACCOUNT NO.:  [id number]   MEDICAL RECORD NO.:  [id number]                   PATIENT TYPE:  INP   LOCATION:  3172                                 FACILITY:  [facility]   PHYSICIAN:  Clydene Fake, M.D.               DATE OF BIRTH:  1945/11/06   DATE OF PROCEDURE:  06/08/2002  DATE OF DISCHARGE:                                 OPERATIVE REPORT   DIAGNOSIS:  Spondylosis and herniated nucleus pulposus, C5-C6 and C6-C7.   POSTOPERATIVE DIAGNOSIS:  Spondylosis and herniated nucleus pulposus, C5-C6  and C6-C7.   PROCEDURE:  Anterior cervical diskectomy and fusion, C5-C6 and C6-C7 with  allograft anterior cervical plate.   SURGEON:  Clydene Fake, M.D.   ASSISTANT:  Hewitt Shorts, M.D.   ANESTHESIA:  General endotracheal tube anesthesia.   ESTIMATED BLOOD LOSS:  Nil.   BLOOD GIVEN:  None.   DRAINS:  None.   COMPLICATIONS:  None.   REASON FOR PROCEDURE:  The patient is a 65 year old gentleman who has had  neck and right arm pain.  He has weakness of the right biceps and numbness  of the right C6 root.  An MRI was done showing spondylosis at C5-C6 and C6-  C7 with lateral disk herniation at the right side of C5-C6.  The patient was  brought in for ACF.   PROCEDURE:  The patient was brought into the operating room, general  anesthesia was induced and the patient was placed on a Coulter traction with  ten pounds and prepped and draped in a sterile fashion.  The site of  incision was injected with 9 cc of 1% lidocaine with epinephrine.  An  incision was then made from the midline to the anterior border of the  sternocleidomastoid muscle on the left side of the neck including taken down  to the platysma and hemostasis was obtained with Bovie cauterization.  The  platysma was incised and we used blunt dissection to go down to the anterior  cervical spine where a needle was placed in the interspace.  An x-ray was  obtained showing  this was the C6-C7 interspace.  The disk space was incised  with a #15 blade and partial diskectomy performed with pituitary rongeurs.  The longus colli muscle was then reflected laterally from C5 to C7 and a  self-retaining retractor was placed.  Both disk space, C5-C6 and C6-C7, were  incised with a #15 blade and diskectomy performed with the pituitary  rongeurs.  Starting at the C5-C6 level, we used curets and Kerrison punches  and continued the diskectomy.  Punches, 1 to 2 mm, were then used to remove  the posterior spondylitic lip and removed the posterior longitudinal  ligament and decompression of the central canal.   Bilateral foraminotomies were done in the right foramen, free fragment disk,  which was laying out lateral.  This was removed.  We had good decompression  of the C6 root.  Prior to doing the diskectomy, distractor pins were placed  in the C5-C6 and the interspace was distracted.  The interspace was then  measured to be 8 mm.  Tibon bone was then hydrated and an 8-mm Tibon bone  graft which was cut to be a few millimeters shorter than the vertebral  bodies was tapped into place.  The distraction pin was removed from C5 and  placed in C7 and the C6-C7 disk space was then distracted and diskectomy  performed.  This space was severely spondylosed and a high-speed drill was  used to get through the disk space.  The posterior longitudinal ligament and  posterior spondylitic ridge were then removed with 1 to 2-mm Kerrison  punches.  We had good decompression of the central canal and bilateral  foraminotomies were performed.  The vertebral body was measured and a 7-mm  Tibon bone graft was tapped into place.  Distraction pins were then removed.  Hemostasis of the bone edges was obtained with Gelfoam and thrombin.  A 35-  mm __________ anterior cervical plate was then placed over the C5 and C7  vertebral bodies and two screws placed into C5 and two into C7 with one  screw in  the C6.  X-rays were taken showing good position of the plate and  screws.  Cephalad we could not get a clear picture of the lower screws  because of the shoulders.  It looked good intraoperatively and anatomically.  The screws were all tightened down.  The retractors were removed.  The wound  was irrigated with antibiotic solution.  Hemostasis was obtained with  Gelfoam and thrombin and bipolar cauterization.  The Gelfoam was then  irrigated out.  We we had absolute hemostasis, the platysma was closed with  3-0 Vicryl interrupted sutures.  The subcutaneous tissue was closed with 3-0  Vicryl interrupted sutures and the skin closed with Benzoin and Steri-  Strips.  A dressing was placed.  A soft cervical collar was placed.  The  patient was then awoken from anesthesia and transferred to the recovery room  in stable condition.                                               Clydene Fake, M.D.    JRH/MEDQ  D:  06/08/2002  T:  06/09/2002  Job:  [id number]

## 2011-02-24 ENCOUNTER — Telehealth: Payer: Self-pay | Admitting: *Deleted

## 2011-02-24 NOTE — Telephone Encounter (Signed)
Pt is very concerned about his stress test,and would like to speak to someone ASAP.

## 2011-02-24 NOTE — Telephone Encounter (Signed)
Per dr Rinaldo Cloud to be normal but some subtle changes that appear to be normal but dr Lovell Sheehan would like him to see a crdiologist due to his family history of heart disease and the fact that he smokes- pt informed and referral sent to terri

## 2011-03-03 ENCOUNTER — Encounter: Payer: Self-pay | Admitting: Cardiovascular Disease

## 2011-03-10 ENCOUNTER — Encounter: Payer: Self-pay | Admitting: Cardiovascular Disease

## 2011-03-10 ENCOUNTER — Ambulatory Visit: Payer: BC Managed Care – PPO | Admitting: Internal Medicine

## 2011-03-10 ENCOUNTER — Ambulatory Visit (INDEPENDENT_AMBULATORY_CARE_PROVIDER_SITE_OTHER): Payer: BC Managed Care – PPO | Admitting: Cardiovascular Disease

## 2011-03-10 DIAGNOSIS — Z79899 Other long term (current) drug therapy: Secondary | ICD-10-CM

## 2011-03-10 DIAGNOSIS — R079 Chest pain, unspecified: Secondary | ICD-10-CM | POA: Insufficient documentation

## 2011-03-10 DIAGNOSIS — R0789 Other chest pain: Secondary | ICD-10-CM | POA: Insufficient documentation

## 2011-03-10 DIAGNOSIS — E785 Hyperlipidemia, unspecified: Secondary | ICD-10-CM

## 2011-03-10 LAB — BASIC METABOLIC PANEL
BUN: 15 mg/dL (ref 6–23)
CO2: 25 mEq/L (ref 19–32)
Calcium: 9.2 mg/dL (ref 8.4–10.5)
Chloride: 103 mEq/L (ref 96–112)
Creatinine, Ser: 0.7 mg/dL (ref 0.4–1.5)
GFR: 113.12 mL/min (ref >60.00)
Glucose, Bld: 95 mg/dL (ref 70–99)
Potassium: 4.4 mEq/L (ref 3.5–5.1)
Sodium: 135 mEq/L (ref 135–145)

## 2011-03-10 NOTE — Assessment & Plan Note (Addendum)
F/U Dr Lovell Sheehan  Not sure why he is not on statin.  LDL's consistantly around 045 with positive family history.  Calcium Score with cardiac CT will help guide aggressiveness of Rx

## 2011-03-10 NOTE — Progress Notes (Signed)
65 yo referred by Dr Lovell Sheehan for ongoing SSCP and markedly positive family history.  Patient has has somewhat atypical SSCP for a few months.  Gets sharp left sided pain at rest or with activiity that radiates to his left shoulder.  Can last seconds or minutes.  No associated diaphoresis.  Gets palpitations with it.  Has had recent right knee and lower back issues requiring steroid injections.  Limits his activity.  Pain is persistant and not improving.  He had a myovue done 02/18/11  Read by Dr Teressa Lower.  Low risk but not diagnostic due to gut attenuation.  No exercise data as it was done with lexiscan.  I reviewed images and there is a fairly significant inferior defect but counts may have been decreased from a loop of bowel.  Given persistant symptoms, non diagnostic myovue and markedly positive family history I think further testing is indicated He is an ideal candidate for cardiac CT.    ROS: Denies fever, malais, weight loss, blurry vision, decreased visual acuity, cough, sputum, SOB, hemoptysis, pleuritic pain, palpitaitons, heartburn, abdominal pain, melena, lower extremity edema, claudication, or rash.  All other systems reviewed and negative   General: Affect appropriate Healthy:  appears stated age HEENT: normal Neck supple with no adenopathy JVP normal no bruits no thyromegaly Lungs clear with no wheezing and good diaphragmatic motion Heart:  S1/S2 no murmur,rub, gallop or click PMI normal Abdomen: benighn, BS positve, no tenderness, no AAA no bruit.  No HSM or HJR Distal pulses intact with no bruits No edema Neuro non-focal Skin warm and dry No muscular weakness  Medications Current Outpatient Prescriptions  Medication Sig Dispense Refill  . aspirin EC 81 MG EC tablet Take 81 mg by mouth daily.        . Clobetasol Prop Emollient Base (CLOBETASOL PROPIONATE E) 0.05 % emollient cream Apply topically 2 (two) times daily.        . Flaxseed, Linseed, (FLAX SEED OIL) 1000 MG CAPS  Take by mouth.        . Folic Acid-Vit B6-Vit B12 (FOLBEE) 2.5-25-1 MG TABS Take 1 tablet by mouth daily.        . Multiple Vitamins-Minerals (ICAPS MV) TABS Take 2 tablets by mouth daily.        Marland Kitchen omeprazole (PRILOSEC) 20 MG capsule Take 20 mg by mouth daily.        Marland Kitchen DISCONTD: Ranibizumab (LUCENTIS) 0.5 MG/0.05ML SOLN Inject into the eye. Eye injections every 12 weeks         Allergies Atorvastatin; Ciprofloxacin; Codeine phosphate; and Simvastatin  Family History: Family History  Problem Relation Age of Onset  . Liver disease    . Prostate cancer    . Coronary artery disease    . Heart disease Mother   . Diabetes Mother   . Cirrhosis Mother   . Heart disease Father   . Cancer Father     prostate  . Heart disease Sister   . Arthritis Sister   . Heart disease Brother     Social History: History   Social History  . Marital Status: Married    Spouse Name: N/A    Number of Children: N/A  . Years of Education: N/A   Occupational History  . retired    Social History Main Topics  . Smoking status: Current Everyday Smoker -- 0.8 packs/day  . Smokeless tobacco: Not on file  . Alcohol Use: Yes  . Drug Use: No  . Sexually Active: Yes  Other Topics Concern  . Not on file   Social History Narrative  . No narrative on file    Electrocardiogram:  NSR 63 normal ECG  Assessment and Plan

## 2011-03-10 NOTE — Assessment & Plan Note (Signed)
Persistant with multiple CRF;s and nondiagnostic myovue.  Dont think invasive cath warranted.  Cardiac CT to avoid invasive procedure as clinical syndrome appears low risk

## 2011-03-10 NOTE — Patient Instructions (Addendum)
Your physician recommends that you schedule a follow-up appointment in: AS NEEDED   Your physician recommends that you continue on your current medications as directed. Please refer to the Current Medication list given to you today.  Your physician has requested that you have cardiac CT. Cardiac computed tomography (CT) is a painless test that uses an x-ray machine to take clear, detailed pictures of your heart. For further information please visit [email or web address] Please follow instruction sheet as given.  Your physician recommends that you return for lab work in: BMET TODAY  V58.69

## 2011-03-11 ENCOUNTER — Encounter: Payer: Self-pay | Admitting: *Deleted

## 2011-03-18 ENCOUNTER — Encounter: Payer: Self-pay | Admitting: Cardiovascular Disease

## 2011-03-26 ENCOUNTER — Ambulatory Visit (HOSPITAL_COMMUNITY)
Admission: RE | Admit: 2011-03-26 | Discharge: 2011-03-26 | Disposition: A | Discharge status: Home / Self Care | Payer: BC Managed Care – PPO | Source: Ambulatory Visit | Attending: Cardiovascular Disease | Admitting: Cardiovascular Disease

## 2011-03-26 DIAGNOSIS — R079 Chest pain, unspecified: Secondary | ICD-10-CM | POA: Insufficient documentation

## 2011-03-26 DIAGNOSIS — J438 Other emphysema: Secondary | ICD-10-CM | POA: Insufficient documentation

## 2011-03-26 DIAGNOSIS — I2510 Atherosclerotic heart disease of native coronary artery without angina pectoris: Secondary | ICD-10-CM | POA: Insufficient documentation

## 2011-03-26 HISTORY — DX: Atherosclerotic heart disease of native coronary artery without angina pectoris: I25.10

## 2011-03-26 MED ORDER — IOHEXOL 350 MG/ML SOLN
80.0000 mL | Freq: Once | INTRAVENOUS | Status: AC | PRN
  Administered 2011-03-26: 80 mL via INTRAVENOUS

## 2011-03-30 ENCOUNTER — Telehealth: Payer: Self-pay | Admitting: Cardiovascular Disease

## 2011-03-30 NOTE — Telephone Encounter (Signed)
Spoke with pt, per note from dr Eden Emms on cardiac ct report pt needs left heart cath. Pt would like to go Wednesday this week. Will make arrangements Deliah Goody

## 2011-03-30 NOTE — Telephone Encounter (Signed)
Per pt calling regarding cath.

## 2011-03-31 ENCOUNTER — Other Ambulatory Visit (INDEPENDENT_AMBULATORY_CARE_PROVIDER_SITE_OTHER): Payer: BC Managed Care – PPO | Admitting: *Deleted

## 2011-03-31 ENCOUNTER — Other Ambulatory Visit: Payer: Self-pay | Admitting: *Deleted

## 2011-03-31 ENCOUNTER — Encounter: Payer: Self-pay | Admitting: *Deleted

## 2011-03-31 DIAGNOSIS — Z01810 Encounter for preprocedural cardiovascular examination: Secondary | ICD-10-CM

## 2011-03-31 DIAGNOSIS — R079 Chest pain, unspecified: Secondary | ICD-10-CM

## 2011-03-31 LAB — PROTIME-INR
INR: 1 ratio (ref 0.8–1.0)
Prothrombin Time: 11.6 s (ref 10.2–12.4)

## 2011-03-31 LAB — CBC WITH DIFFERENTIAL/PLATELET
Basophils Absolute: 0 10*3/uL (ref 0.0–0.1)
Basophils Relative: 0.2 % (ref 0.0–3.0)
Eosinophils Absolute: 0 10*3/uL (ref 0.0–0.7)
Eosinophils Relative: 0.4 % (ref 0.0–5.0)
HCT: 44.3 % (ref 39.0–52.0)
Hemoglobin: 15.2 g/dL (ref 13.0–17.0)
Lymphocytes Relative: 18.2 % (ref 12.0–46.0)
Lymphs Abs: 1.9 10*3/uL (ref 0.7–4.0)
MCHC: 34.4 g/dL (ref 30.0–36.0)
MCV: 95.1 fl (ref 78.0–100.0)
Monocytes Absolute: 0.6 10*3/uL (ref 0.1–1.0)
Monocytes Relative: 5.6 % (ref 3.0–12.0)
Neutro Abs: 7.9 10*3/uL — ABNORMAL HIGH (ref 1.4–7.7)
Neutrophils Relative %: 75.6 % (ref 43.0–77.0)
Platelets: 182 10*3/uL (ref 150.0–400.0)
RBC: 4.66 Mil/uL (ref 4.22–5.81)
RDW: 13.3 % (ref 11.5–14.6)
WBC: 10.4 10*3/uL (ref 4.5–10.5)

## 2011-03-31 LAB — BASIC METABOLIC PANEL
BUN: 15 mg/dL (ref 6–23)
CO2: 28 mEq/L (ref 19–32)
Calcium: 9.3 mg/dL (ref 8.4–10.5)
Chloride: 104 mEq/L (ref 96–112)
Creatinine, Ser: 1 mg/dL (ref 0.4–1.5)
GFR: 83.76 mL/min (ref >60.00)
Glucose, Bld: 84 mg/dL (ref 70–99)
Potassium: 4.9 mEq/L (ref 3.5–5.1)
Sodium: 138 mEq/L (ref 135–145)

## 2011-03-31 LAB — APTT: aPTT: 28.1 s (ref 21.7–28.8)

## 2011-04-01 ENCOUNTER — Inpatient Hospital Stay (HOSPITAL_BASED_OUTPATIENT_CLINIC_OR_DEPARTMENT_OTHER)
Admission: RE | Admit: 2011-04-01 | Discharge: 2011-04-01 | Disposition: A | Discharge status: Home / Self Care | Payer: BC Managed Care – PPO | Source: Ambulatory Visit | Attending: Cardiovascular Disease | Admitting: Cardiovascular Disease

## 2011-04-01 DIAGNOSIS — R0789 Other chest pain: Secondary | ICD-10-CM | POA: Insufficient documentation

## 2011-04-01 DIAGNOSIS — I2510 Atherosclerotic heart disease of native coronary artery without angina pectoris: Secondary | ICD-10-CM

## 2011-04-01 HISTORY — PX: CARDIAC CATHETERIZATION: SHX172

## 2011-04-02 ENCOUNTER — Encounter: Payer: Self-pay | Admitting: *Deleted

## 2011-04-20 ENCOUNTER — Encounter: Payer: BC Managed Care – PPO | Admitting: Physician Assistant

## 2011-04-22 ENCOUNTER — Ambulatory Visit (HOSPITAL_COMMUNITY)
Admission: RE | Admit: 2011-04-22 | Discharge: 2011-04-22 | Disposition: A | Discharge status: Home / Self Care | Payer: BC Managed Care – PPO | Source: Ambulatory Visit | Attending: Cardiovascular Disease | Admitting: Cardiovascular Disease

## 2011-04-22 DIAGNOSIS — I2510 Atherosclerotic heart disease of native coronary artery without angina pectoris: Secondary | ICD-10-CM

## 2011-04-22 DIAGNOSIS — Z7982 Long term (current) use of aspirin: Secondary | ICD-10-CM | POA: Insufficient documentation

## 2011-04-22 DIAGNOSIS — J4489 Other specified chronic obstructive pulmonary disease: Secondary | ICD-10-CM | POA: Insufficient documentation

## 2011-04-22 DIAGNOSIS — J449 Chronic obstructive pulmonary disease, unspecified: Secondary | ICD-10-CM | POA: Insufficient documentation

## 2011-04-22 DIAGNOSIS — F411 Generalized anxiety disorder: Secondary | ICD-10-CM | POA: Insufficient documentation

## 2011-04-22 DIAGNOSIS — F17200 Nicotine dependence, unspecified, uncomplicated: Secondary | ICD-10-CM | POA: Insufficient documentation

## 2011-04-22 DIAGNOSIS — Z79899 Other long term (current) drug therapy: Secondary | ICD-10-CM | POA: Insufficient documentation

## 2011-04-22 DIAGNOSIS — Z8249 Family history of ischemic heart disease and other diseases of the circulatory system: Secondary | ICD-10-CM | POA: Insufficient documentation

## 2011-04-22 DIAGNOSIS — E785 Hyperlipidemia, unspecified: Secondary | ICD-10-CM | POA: Insufficient documentation

## 2011-04-22 HISTORY — PX: CARDIAC CATHETERIZATION: SHX172

## 2011-04-22 LAB — POCT ACTIVATED CLOTTING TIME: Activated Clotting Time: 408 seconds

## 2011-04-22 LAB — PROTIME-INR
INR: 1 (ref 0.00–1.49)
Prothrombin Time: 13.4 seconds (ref 11.6–15.2)

## 2011-04-22 LAB — CBC
HCT: 41.9 % (ref 39.0–52.0)
Hemoglobin: 15.2 g/dL (ref 13.0–17.0)
MCH: 32.1 pg (ref 26.0–34.0)
MCHC: 36.3 g/dL — ABNORMAL HIGH (ref 30.0–36.0)
MCV: 88.6 fL (ref 78.0–100.0)
Platelets: 152 10*3/uL (ref 150–400)
RBC: 4.73 MIL/uL (ref 4.22–5.81)
RDW: 12.4 % (ref 11.5–15.5)
WBC: 8 10*3/uL (ref 4.0–10.5)

## 2011-04-22 LAB — BASIC METABOLIC PANEL
BUN: 14 mg/dL (ref 6–23)
CO2: 25 mEq/L (ref 19–32)
Calcium: 9.3 mg/dL (ref 8.4–10.5)
Chloride: 103 mEq/L (ref 96–112)
Creatinine, Ser: 0.79 mg/dL (ref 0.50–1.35)
GFR calc Af Amer: >60 mL/min (ref >60)
GFR calc non Af Amer: >60 mL/min (ref >60)
Glucose, Bld: 95 mg/dL (ref 70–99)
Potassium: 3.9 mEq/L (ref 3.5–5.1)
Sodium: 137 mEq/L (ref 135–145)

## 2011-05-05 ENCOUNTER — Telehealth: Payer: Self-pay | Admitting: Cardiology

## 2011-05-05 NOTE — Telephone Encounter (Signed)
ROI Mailed to Pt 05/05/11/km

## 2011-05-07 NOTE — Cardiovascular Report (Signed)
Ryan [name], Ryan [name]                ACCOUNT NO.:  [id number]  MEDICAL RECORD NO.:  [id number]  LOCATION:                                 FACILITY:  PHYSICIAN:  Lorine Bears, MD     DATE OF BIRTH:  January 31, 1946  DATE OF PROCEDURE: DATE OF DISCHARGE:                           CARDIAC CATHETERIZATION   PRIMARY CARDIOLOGIST:  Theron Arista C. Eden Emms, MD, [facility].  PRIMARY CARE PHYSICIAN:  Stacie Glaze, MD  PROCEDURES PERFORMED: 1. Left heart catheterization. 2. Coronary angiography. 3. Pressure wire interrogation as well as intravascular ultrasound of     the left main coronary artery. 4. Pressure wire interrogation of the right coronary artery.  INDICATION AND CLINICAL HISTORY:  This is a 65 year old gentleman who recently had atypical chest pain with equivocal stress test.  He underwent CTA of the coronary arteries, which showed borderline lesion in the left main as well as proximal right coronary artery.  He underwent cardiac catheterization by me which showed 50-60% ostial left main stenosis as well as 60% proximal RCA disease.  These lesions were felt to be borderline significant, but required further interrogation to see if revascularization is required.  Also, I felt that his symptoms were overall atypical for angina.  Risks, benefits, and alternatives were discussed with the patient.  ACCESS:  Right radial artery.  STUDY DETAILS:  A standard informed consent was obtained.  The right radial area was prepped in a sterile fashion.  It was anesthetized with 1% lidocaine.  A 6-French sheath was placed in the right radial artery after an anterior puncture.  The left main coronary angiography was performed with a JL3.5 guiding catheter.  A pressure wire was advanced in the usual fashion after normalizing.  Before that IV bivalirudin was given with therapeutic Angiomax.  Intravenous adenosine at the dose of 140 mcg/kg per minute was given.  FFR ratio was noted to be 0.85.  I then  proceeded with an IVUS interrogation, which was performed with an automatic pullback.  This showed minimal luminal area of 7.7 cm sq. which is above the cutoff of 6 mm sq. which is above the cutoff of 6 mm sq. to warrant revascularization.  The wire was then removed. Angiography showed no complications.  I then advanced a JR4 guiding catheter and engaged the right coronary artery.  Coronary angiography was performed.  I advanced pressure wire in the usual fashion and IV adenosine at the dose of 140 mcg/kg per minute was given.  FFR ratio was recorded after 2 minutes and it was 0.89.  The wire and guiding catheter were removed.  The sheath was removed and a TR band was applied.  There were no immediate complications.  STUDY FINDINGS:  Hemodynamic findings:  Left ventricular pressure is 127/6 with the left ventricular end-diastolic pressure of 15 mmHg. Aortic pressure is 129/64 with a mean pressure of 91 mmHg.  Coronary angiography: Left main coronary artery:  This showed 50% ostial lesion. Left anterior descending artery:  The vessel is calcified with diffuse 60-70% disease in the mid to distal segment, but the vessel is very small in size, less than 2 mm and serves very small territory.  This  is unchanged from recent cardiac catheterization. Right coronary artery:  The vessel is very large and dominant.  There is a 50-60% tubular proximal stenosis.  The rest of the coronary anatomy is unchanged from recent cardiac catheterization.  Left main pressure wire interrogation:  This showed an FFR ratio of 0.85.  Left main IVUS interrogation:  This showed a minimal luminal area of 7 mm sq. which is above the cutoff of 6 mm sq. for revascularization.  Right coronary artery FFR interrogation:  This showed a ratio of 0.89 which is above the cutoff of 0.80.  STUDY CONCLUSION: 1. 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 2. Recommend medical therapy. 3. The patient had reproducible chest pain during adenosine infusion     similar to his symptoms that has been having.  He had significant     anxiety and palpitations although his heart rate was only 65 beats     per minute.  I suspect that there might be a component of anxiety     to his symptoms as well.     Lorine Bears, MD     MA/MEDQ  D:  04/22/2011  T:  04/22/2011  Job:  [id number]  cc:   Noralyn Pick. Eden Emms, MD, [facility] Stacie Glaze, MD  Electronically Signed by Lorine Bears MD on 05/07/2011 02:07:48 PM

## 2011-05-07 NOTE — Cardiovascular Report (Signed)
Ryan [name], Ryan [name]                ACCOUNT NO.:  [id number]  MEDICAL RECORD NO.:  [id number]  LOCATION:  CATS                         FACILITY:  [facility]  PHYSICIAN:  Lorine Bears, MD     DATE OF BIRTH:  07/27/1946  DATE OF PROCEDURE:  04/01/2011 DATE OF DISCHARGE:  03/26/2011                           CARDIAC CATHETERIZATION   REFERRING PHYSICIAN:  Theron Arista C. Eden Emms, MD, [facility]  PRIMARY CARE PHYSICIAN:  Stacie Glaze, MD  PROCEDURES PERFORMED: 1. Left heart catheterization 2. Coronary angiography. 3. Left ventricular angiography.  INDICATION AND CLINICAL HISTORY:  This is a 65 year old gentleman with strong family history of premature coronary artery disease, hyperlipidemia with intolerance to statins with recent symptoms of atypical substernal chest discomfort.  His nuclear stress test was overall nondiagnostic.  He underwent CTA of the coronary arteries which showed borderline lesion in the left main as well as proximal right coronary artery.  He was thus referred for cardiac catheterization. Risks, benefits and alternatives were discussed with the patient.  STUDY DETAILS:  A standard informed consent was obtained.  The right groin area was prepped in a sterile fashion.  It was anesthetized with 1% lidocaine.  A 4-French sheath was placed in the right femoral artery after an anterior puncture.  Coronary angiography was performed with a JL-4, 3-D RCA and pigtail catheter.  All catheter exchanges were done over the wire.  The patient tolerated the procedure well with no immediate complications.  STUDY FINDINGS:  Hemodynamic findings:  Left ventricular pressure is 106/3 with a left ventricular end-diastolic pressure of 9 mmHg.  Central aortic pressure of 102/55.  Left ventricular angiography:  This showed normal LV systolic function and wall motion with an estimated ejection fraction of 60%.  No significant mitral regurgitation.  CORONARY ANGIOGRAPHY:  Left main coronary  artery:  The vessel is normal in size with noted ostial disease which seems to be in the range of 40- 50%.  The mid and distal left main does not have significant disease.  Left circumflex artery:  The vessel is overall small and nondominant. Has minor irregularities without obstructive disease.  OM-1 is normal in size and free of significant disease.  OM-2 and OM-3 are very small sized branches.  Left anterior descending artery:  The vessel is overall small in size and does not really reach the apex distally.  The distal LAD distribution is mostly supplied by the right coronary artery.  The vessel has minor calcifications.  In the mid-to-distal LAD, there is 60- 70% disease in an area where the caliber of the vessel appears to be 2- mm.  This supplies a small territory only.  First diagonal is large in size and free of significant disease.  Second diagonal is a very large sized branch with 30% mid stenosis.  Right coronary artery:  The vessel is very large in size and dominant. In the proximal segment, there is 60% tubular stenosis.  The rest of the right coronary artery has minor irregularities without obstructive disease.  The vessel is mildly calcified especially in the proximal and mid segment.  The right PDA is large in size and free of significant disease.  The posterolateral branches  are also very large in size and they reach the apex supplying the distal LAD distribution.  STUDY CONCLUSIONS: 1. Moderate left main stenosis. 2. Borderline significant disease in the proximal right coronary     artery which is very large and dominant. 3. Significant mid to distal LAD stenosis.  However, the vessel in     this area is very small in diameter around 2-mm and supplying only     a small territory. 4. Normal LV systolic function and normal left ventricular end-     diastolic pressure.  RECOMMENDATIONS:  This is overall a difficult case to manage.  Iadeally, that the patient should  be treated with aggressive medical therapy with statins.  However, he is intolerant to most statins.  Medical therapy should be continued with aspirin daily.  The LAD lesion is overall in a small-sized caliber location and supplies a small territory overall. Thus, revascularization is not advised.  The left main disease appears to be moderate.  There is borderline significant lesion in the right coronary artery.  We will discuss with the referring physician, but probably the best option would be an FFR/IVUS guided revascularization of the right coronary artery and left main.      Lorine Bears, MD     MA/MEDQ  D:  04/01/2011  T:  04/02/2011  Job:  [id number]  cc:   Noralyn Pick. Eden Emms, MD, [facility] Stacie Glaze, MD  Electronically Signed by Lorine Bears MD on 05/07/2011 09:81:19 PM

## 2011-05-08 ENCOUNTER — Encounter: Payer: Self-pay | Admitting: Internal Medicine

## 2011-05-08 ENCOUNTER — Ambulatory Visit (INDEPENDENT_AMBULATORY_CARE_PROVIDER_SITE_OTHER): Payer: BC Managed Care – PPO | Admitting: Internal Medicine

## 2011-05-08 VITALS — BP 136/80 | HR 72 | Temp 98.2°F | Resp 16 | Ht 66.0 in | Wt 155.0 lb

## 2011-05-08 DIAGNOSIS — J449 Chronic obstructive pulmonary disease, unspecified: Secondary | ICD-10-CM

## 2011-05-08 DIAGNOSIS — E785 Hyperlipidemia, unspecified: Secondary | ICD-10-CM

## 2011-05-08 DIAGNOSIS — I2510 Atherosclerotic heart disease of native coronary artery without angina pectoris: Secondary | ICD-10-CM

## 2011-05-08 DIAGNOSIS — I10 Essential (primary) hypertension: Secondary | ICD-10-CM

## 2011-05-08 DIAGNOSIS — F17200 Nicotine dependence, unspecified, uncomplicated: Secondary | ICD-10-CM

## 2011-05-08 MED ORDER — EZETIMIBE 10 MG PO TABS: 10.0000 mg | ORAL_TABLET | Freq: Every day | ORAL | Status: DC

## 2011-05-08 NOTE — Patient Instructions (Addendum)
Since vitamin therapy is not covered under your current insurance I want you to obtain a following 3 vitamins and take them together folic acid 1 mg, b6 50 and J19 500mg  daily   Refer you for hypnosis you will hear probably within a week from my psychiatrists

## 2011-05-08 NOTE — Progress Notes (Signed)
Subjective:    Patient ID: Ryan [name], male    DOB: 12-20-1945, 65 y.o.   MRN: [id number]  HPI patient had a cardiac CT and followup 2 catheterizations.  Interpretation that he understood from that the studies was that he had minimal cardiovascular disease that was consistent with age and not cardiovascular disease that required an intervention.  In other words the conclusion was medical management of his cardiovascular disease.  Which means tight control of his cholesterol hypertension  The scored CT scan showed emphysema of moderate nature and showed multivessel disease but no blockage appeared to be greater than 50%.  Subsequent cardiac catheterization    Review of Systems  Constitutional: Negative for fever and fatigue.  HENT: Negative for hearing loss, congestion, neck pain and postnasal drip.   Eyes: Negative for discharge, redness and visual disturbance.  Respiratory: Negative for cough, shortness of breath and wheezing.   Cardiovascular: Negative for leg swelling.  Gastrointestinal: Negative for abdominal pain, constipation and abdominal distention.  Genitourinary: Negative for urgency and frequency.  Musculoskeletal: Negative for joint swelling and arthralgias.  Skin: Negative for color change and rash.  Neurological: Negative for weakness and light-headedness.  Hematological: Negative for adenopathy.  Psychiatric/Behavioral: Negative for behavioral problems.   Past Medical History  Diagnosis Date  . H/O: rheumatic fever   . Kidney stones   . GERD (gastroesophageal reflux disease)   . Heart murmur   . Anemia   . Hypertension   . Macular degeneration   . Hyperlipidemia   . History of colonoscopy    Past Surgical History  Procedure Date  . Cervical discectomy   . Cholecystectomy   . Mouth surgery   . Knee cartiledge   . Foot surgery   . Appendectomy     reports that he has been smoking.  He does not have any smokeless tobacco history on file. He  reports that he drinks alcohol. He reports that he does not use illicit drugs. family history includes Arthritis in his sister; Cancer in his father; Cirrhosis in his mother; Coronary artery disease in an unspecified family member; Diabetes in his mother; Heart disease in his brother, father, mother, and sister; Liver disease in an unspecified family member; and Prostate cancer in an unspecified family member. Allergies  Allergen Reactions  . Atorvastatin     REACTION: unspecified  . Ciprofloxacin     REACTION: unspecified  . Codeine Phosphate     REACTION: unspecified  . Simvastatin     REACTION: unspecified       Objective:   Physical Exam  Nursing note and vitals reviewed. Constitutional: He appears well-developed and well-nourished.  HENT:  Head: Normocephalic and atraumatic.  Eyes: Conjunctivae are normal. Pupils are equal, round, and reactive to light.  Neck: Normal range of motion. Neck supple.  Cardiovascular: Normal rate and regular rhythm.   Pulmonary/Chest: Effort normal and breath sounds normal.  Abdominal: Soft. Bowel sounds are normal.          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 Anxiety plays a role in his chest pain at this time we discussed stretching massage therapy and relaxation.  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  He will require cardiology followup and we will seek to establish with a cardiologist. 15 minutes was spent in counseling for smoking cessation  We discussed using Zetia the visual the aspirin has interventions to get  his cholesterol as low as possible and her primary intervention should be smoking cessation.  He agrees to go see a hypnotist and we have made a referral to our psychiatry division to help [place] in arranging this.

## 2011-05-19 ENCOUNTER — Encounter (INDEPENDENT_AMBULATORY_CARE_PROVIDER_SITE_OTHER): Payer: Self-pay

## 2011-05-19 DIAGNOSIS — F17200 Nicotine dependence, unspecified, uncomplicated: Secondary | ICD-10-CM

## 2011-06-26 ENCOUNTER — Telehealth: Payer: Self-pay | Admitting: Cardiovascular Disease

## 2011-06-26 LAB — POCT CARDIAC MARKERS
CKMB, poc: <1 — ABNORMAL LOW
CKMB, poc: <1 — ABNORMAL LOW
Myoglobin, poc: 47.7
Myoglobin, poc: 55.1
Operator id: 198171
Operator id: 198171
Troponin i, poc: <0.05
Troponin i, poc: <0.05

## 2011-06-26 LAB — DIFFERENTIAL
Basophils Absolute: 0.1
Basophils Relative: 1
Eosinophils Absolute: 0.1
Eosinophils Relative: 1
Lymphocytes Relative: 24
Lymphs Abs: 2.3
Monocytes Absolute: 0.6
Monocytes Relative: 6
Neutro Abs: 6.4
Neutrophils Relative %: 68

## 2011-06-26 LAB — BASIC METABOLIC PANEL
BUN: 14
CO2: 24
Calcium: 9.4
Chloride: 104
Creatinine, Ser: 0.95
GFR calc Af Amer: >60
GFR calc non Af Amer: >60
Glucose, Bld: 107 — ABNORMAL HIGH
Potassium: 4.3
Sodium: 136

## 2011-06-26 LAB — TYPE AND SCREEN
ABO/RH(D): O POS
Antibody Screen: POSITIVE
DAT, IgG: NEGATIVE
PT AG Type: NEGATIVE

## 2011-06-26 LAB — CBC
HCT: 40.5
Hemoglobin: 14.2
MCHC: 35
MCV: 92.5
Platelets: 172
RBC: 4.38
RDW: 13
WBC: 9.5

## 2011-06-26 LAB — PROTIME-INR
INR: 1
Prothrombin Time: 13.6

## 2011-06-26 LAB — APTT: aPTT: 29

## 2011-06-26 NOTE — Telephone Encounter (Signed)
ROI Signed, pt picked up copy of Cath,Stress,Ct of Heart  06/26/11/km

## 2011-07-06 ENCOUNTER — Other Ambulatory Visit (INDEPENDENT_AMBULATORY_CARE_PROVIDER_SITE_OTHER): Payer: BC Managed Care – PPO

## 2011-07-06 DIAGNOSIS — E785 Hyperlipidemia, unspecified: Secondary | ICD-10-CM

## 2011-07-06 LAB — LIPID PANEL
Cholesterol: 180 mg/dL (ref 0–200)
HDL: 50.4 mg/dL (ref >39.00)
LDL Cholesterol: 119 mg/dL — ABNORMAL HIGH (ref 0–99)
Total CHOL/HDL Ratio: 4
Triglycerides: 52 mg/dL (ref 0.0–149.0)
VLDL: 10.4 mg/dL (ref 0.0–40.0)

## 2011-07-06 LAB — HEPATIC FUNCTION PANEL
ALT: 23 U/L (ref 0–53)
AST: 28 U/L (ref 0–37)
Albumin: 4.3 g/dL (ref 3.5–5.2)
Alkaline Phosphatase: 66 U/L (ref 39–117)
Bilirubin, Direct: 0.1 mg/dL (ref 0.0–0.3)
Total Bilirubin: 0.7 mg/dL (ref 0.3–1.2)
Total Protein: 7.7 g/dL (ref 6.0–8.3)

## 2011-07-13 ENCOUNTER — Ambulatory Visit (INDEPENDENT_AMBULATORY_CARE_PROVIDER_SITE_OTHER): Payer: BC Managed Care – PPO | Admitting: Internal Medicine

## 2011-07-13 ENCOUNTER — Encounter: Payer: Self-pay | Admitting: Internal Medicine

## 2011-07-13 VITALS — BP 122/72 | HR 72 | Temp 98.5°F | Resp 16 | Ht 66.0 in | Wt 154.0 lb

## 2011-07-13 DIAGNOSIS — J019 Acute sinusitis, unspecified: Secondary | ICD-10-CM

## 2011-07-13 DIAGNOSIS — R079 Chest pain, unspecified: Secondary | ICD-10-CM

## 2011-07-13 DIAGNOSIS — K219 Gastro-esophageal reflux disease without esophagitis: Secondary | ICD-10-CM

## 2011-07-13 MED ORDER — AMOXICILLIN-POT CLAVULANATE 875-125 MG PO TABS: 1.0000 | ORAL_TABLET | Freq: Two times a day (BID) | ORAL | Status: DC

## 2011-07-13 MED ORDER — AMOXICILLIN-POT CLAVULANATE 875-125 MG PO TABS: 1.0000 | ORAL_TABLET | Freq: Two times a day (BID) | ORAL | Status: AC

## 2011-07-13 NOTE — Patient Instructions (Signed)
Patient was instructed to continue all medications as prescribed. To stop at the checkout desk and schedule a followup appointment  

## 2011-10-07 NOTE — Progress Notes (Signed)
Subjective:    Patient ID: Ryan [name], male    DOB: [birth year], 66 y.o.   MRN: [id number]  HPI Patient presents today with acute complaint of a sinus infection with congestion purulent discharge had pressure and cough.  Patient is a 66 year old white male with multiple medical problems including persistent tobacco abuse despite multiple efforts at cessation and counseling.  He is followed for hyperlipidemia, hypertension and gastroesophageal reflux.  He has had multiple orthopedic issues following knee replacement but he states that with physical therapy he is doing better and is more mobile.  Review of Systems  Constitutional: Negative for fever and fatigue.  HENT: Positive for ear pain, congestion, rhinorrhea and postnasal drip. Negative for hearing loss and neck pain.   Eyes: Negative for discharge, redness and visual disturbance.  Respiratory: Positive for cough. Negative for shortness of breath and wheezing.   Cardiovascular: Negative for leg swelling.  Gastrointestinal: Negative for abdominal pain, constipation and abdominal distention.  Genitourinary: Negative for urgency and frequency.  Musculoskeletal: Negative for joint swelling and arthralgias.  Skin: Negative for color change and rash.  Neurological: Negative for weakness and light-headedness.  Hematological: Negative for adenopathy.  Psychiatric/Behavioral: Negative for behavioral problems.   Past Medical History  Diagnosis Date  . H/O: rheumatic fever   . Kidney stones   . GERD (gastroesophageal reflux disease)   . Heart murmur   . Anemia   . Hypertension   . Macular degeneration   . Hyperlipidemia   . History of colonoscopy     History   Social History  . Marital Status: Married    Spouse Name: N/A    Number of Children: N/A  . Years of Education: N/A   Occupational History  . retired    Social History Main Topics  . Smoking status: Current Everyday Smoker -- 0.8 packs/day  . Smokeless  tobacco: Not on file  . Alcohol Use: Yes  . Drug Use: No  . Sexually Active: Yes   Other Topics Concern  . Not on file   Social History Narrative  . No narrative on file    Past Surgical History  Procedure Date  . Cervical discectomy   . Cholecystectomy   . Mouth surgery   . Knee cartiledge   . Foot surgery   . Appendectomy     Family History  Problem Relation Age of Onset  . Liver disease    . Prostate cancer    . Coronary artery disease    . Heart disease Mother   . Diabetes Mother   . Cirrhosis Mother   . Heart disease Father   . Cancer Father     prostate  . Heart disease Sister   . Arthritis Sister   . Heart disease Brother     Allergies  Allergen Reactions  . Atorvastatin     REACTION: unspecified  . Ciprofloxacin     REACTION: unspecified  . Codeine Phosphate     REACTION: unspecified  . Simvastatin     REACTION: unspecified    Current Outpatient Prescriptions on File Prior to Visit  Medication Sig Dispense Refill  . aspirin EC 81 MG EC tablet Take 81 mg by mouth daily.        . Clobetasol Prop Emollient Base (CLOBETASOL PROPIONATE E) 0.05 % emollient cream Apply topically 2 (two) times daily.        Marland Kitchen ezetimibe (ZETIA) 10 MG tablet Take 1 tablet (10 mg total) by mouth daily.  30 tablet  11  . Flaxseed, Linseed, (FLAX SEED OIL) 1000 MG CAPS Take by mouth.        . Folic Acid-Vit B6-Vit B12 (FOLBEE) 2.5-25-1 MG TABS Take 1 tablet by mouth daily.        . Lactobacillus (ACIDOPHILUS) 10 MG CAPS Take by mouth daily.        . Multiple Vitamins-Minerals (ICAPS MV) TABS Take 2 tablets by mouth daily.        . Nutritional Supplements (ACID BLOCKERS DEPLETION PO) Take by mouth.        Marland Kitchen omeprazole (PRILOSEC) 20 MG capsule Take 20 mg by mouth daily.        . psyllium (METAMUCIL) 0.52 G capsule Take 0.52 g by mouth 2 (two) times daily.          BP 122/72  Pulse 72  Temp 98.5 F (36.9 C)  Resp 16  Ht 5\' 6"  (1.676 m)  Wt 154 lb (69.854 kg)  BMI 24.86  kg/m2       Objective:   Physical Exam  Nursing note and vitals reviewed. Constitutional: He appears well-developed and well-nourished.  HENT:  Head: Normocephalic and atraumatic.       Swollen nasal turbinates posterior nasal drip with marked cobblestoning  Eyes: Conjunctivae are normal. Pupils are equal, round, and reactive to light.  Neck: Normal range of motion. Neck supple.  Cardiovascular: Normal rate and regular rhythm.   Pulmonary/Chest: Effort normal and breath sounds normal.  Abdominal: Soft. Bowel sounds are normal.          Assessment & Plan:  Good blood pressure control on current medications.  Again urged to stop smoking smoking counseling given.  GERD stable Reviewed cholesterol medications including Zetia the use of omega-3 supplementation.  Monitoring for cholesterol plan with Dr. reduction discussed with patient  treatment for acute sinusitis with Augmentin 875 twice a day 10 days

## 2011-10-16 ENCOUNTER — Ambulatory Visit: Payer: BC Managed Care – PPO | Admitting: Internal Medicine

## 2011-10-29 ENCOUNTER — Ambulatory Visit (INDEPENDENT_AMBULATORY_CARE_PROVIDER_SITE_OTHER): Payer: BC Managed Care – PPO | Admitting: Internal Medicine

## 2011-10-29 ENCOUNTER — Encounter: Payer: Self-pay | Admitting: Internal Medicine

## 2011-10-29 VITALS — BP 136/72 | HR 72 | Temp 98.1°F | Resp 16 | Ht 66.0 in | Wt 154.0 lb

## 2011-10-29 DIAGNOSIS — I2510 Atherosclerotic heart disease of native coronary artery without angina pectoris: Secondary | ICD-10-CM

## 2011-10-29 DIAGNOSIS — E785 Hyperlipidemia, unspecified: Secondary | ICD-10-CM

## 2011-10-29 DIAGNOSIS — J449 Chronic obstructive pulmonary disease, unspecified: Secondary | ICD-10-CM

## 2011-10-29 DIAGNOSIS — L219 Seborrheic dermatitis, unspecified: Secondary | ICD-10-CM

## 2011-10-29 DIAGNOSIS — L210 Seborrhea capitis: Secondary | ICD-10-CM

## 2011-10-29 DIAGNOSIS — F17200 Nicotine dependence, unspecified, uncomplicated: Secondary | ICD-10-CM

## 2011-10-29 MED ORDER — CLOBETASOL PROP EMOLLIENT BASE 0.05 % EX CREA: TOPICAL_CREAM | CUTANEOUS | Status: DC

## 2011-10-29 MED ORDER — FLUTICASONE-SALMETEROL 100-50 MCG/DOSE IN AEPB: 1.0000 | INHALATION_SPRAY | Freq: Two times a day (BID) | RESPIRATORY_TRACT | Status: DC

## 2011-10-29 MED ORDER — RENA-VITE PO TABS: 1.0000 | ORAL_TABLET | Freq: Every day | ORAL | Status: DC

## 2011-10-29 MED ORDER — CLOBETASOL PROPIONATE 0.05 % EX LIQD: Freq: Two times a day (BID) | CUTANEOUS | Status: DC

## 2011-10-29 NOTE — Progress Notes (Signed)
Subjective:    Patient ID: Ryan [name], male    DOB: 07-28-1946, 66 y.o.   MRN: [id number]  HPI The patient has mild symptoms of COPD. Increased cough,  Requesting advair. GERD stable No chest pain Macular degeneration progressive Reviewed the medications and refills needed Review of the lipids on zetia   Review of Systems  Constitutional: Negative for fever and fatigue.  HENT: Negative for hearing loss, congestion, neck pain and postnasal drip.   Eyes: Negative for discharge, redness and visual disturbance.  Respiratory: Negative for cough, shortness of breath and wheezing.   Cardiovascular: Negative for leg swelling.  Gastrointestinal: Negative for abdominal pain, constipation and abdominal distention.  Genitourinary: Negative for urgency and frequency.  Musculoskeletal: Negative for joint swelling and arthralgias.  Skin: Negative for color change and rash.  Neurological: Negative for weakness and light-headedness.  Hematological: Negative for adenopathy.  Psychiatric/Behavioral: Negative for behavioral problems.   Past Medical History  Diagnosis Date  . H/O: rheumatic fever   . Kidney stones   . GERD (gastroesophageal reflux disease)   . Heart murmur   . Anemia   . Hypertension   . Macular degeneration   . Hyperlipidemia   . History of colonoscopy     History   Social History  . Marital Status: Married    Spouse Name: N/A    Number of Children: N/A  . Years of Education: N/A   Occupational History  . retired    Social History Main Topics  . Smoking status: Current Everyday Smoker -- 0.8 packs/day  . Smokeless tobacco: Not on file  . Alcohol Use: Yes  . Drug Use: No  . Sexually Active: Yes   Other Topics Concern  . Not on file   Social History Narrative  . No narrative on file    Past Surgical History  Procedure Date  . Cervical discectomy   . Cholecystectomy   . Mouth surgery   . Knee cartiledge   . Foot surgery   . Appendectomy      Family History  Problem Relation Age of Onset  . Liver disease    . Prostate cancer    . Coronary artery disease    . Heart disease Mother   . Diabetes Mother   . Cirrhosis Mother   . Heart disease Father   . Cancer Father     prostate  . Heart disease Sister   . Arthritis Sister   . Heart disease Brother     Allergies  Allergen Reactions  . Atorvastatin     REACTION: unspecified  . Ciprofloxacin     REACTION: unspecified  . Codeine Phosphate     REACTION: unspecified  . Simvastatin     REACTION: unspecified    Current Outpatient Prescriptions on File Prior to Visit  Medication Sig Dispense Refill  . aspirin EC 81 MG EC tablet Take 81 mg by mouth daily.        Marland Kitchen ezetimibe (ZETIA) 10 MG tablet Take 1 tablet (10 mg total) by mouth daily.  30 tablet  11  . Lactobacillus (ACIDOPHILUS) 10 MG CAPS Take by mouth daily.        . Multiple Vitamins-Minerals (ICAPS MV) TABS Take 2 tablets by mouth daily.        Marland Kitchen omeprazole (PRILOSEC) 20 MG capsule Take 20 mg by mouth daily.        . psyllium (METAMUCIL) 0.52 G capsule Take 0.52 g by mouth 2 (two) times daily.  BP 136/72  Pulse 72  Temp 98.1 F (36.7 C)  Resp 16  Ht 5\' 6"  (1.676 m)  Wt 154 lb (69.854 kg)  BMI 24.86 kg/m2       Objective:   Physical Exam  Constitutional: He appears well-developed and well-nourished.  HENT:  Head: Normocephalic and atraumatic.  Eyes: Conjunctivae are normal. Pupils are equal, round, and reactive to light.  Neck: Normal range of motion. Neck supple.  Cardiovascular: Normal rate and regular rhythm.   Pulmonary/Chest: Effort normal and breath sounds normal.  Abdominal: Soft. Bowel sounds are normal.          Assessment & Plan:  Patient has symptoms of COPD he has used Advair in the past and has a great deal of anxiety about new drugs I told him we will prescribe Advair at this time and see if his insurance will order it is a possibility that we may have to use  dulera. We spent 10-15 minutes discussing with patient and his wife smoking cessation strategies.  He states that every time he tried to stop smoking in the past but he developed some sort of a serious problem and he is very nervous about smoking cessation we told him that those relationships were extremely happenstance.  His blood pressure stable his current medications we discussed his cholesterol management of gastroesophageal reflux management

## 2011-10-29 NOTE — Patient Instructions (Signed)
The patient is instructed to continue all medications as prescribed. Schedule followup with check out clerk upon leaving the clinic  

## 2011-10-30 ENCOUNTER — Ambulatory Visit: Payer: BC Managed Care – PPO | Admitting: Internal Medicine

## 2011-11-03 ENCOUNTER — Telehealth: Payer: Self-pay | Admitting: Family Medicine

## 2011-11-03 MED ORDER — MOMETASONE FURO-FORMOTEROL FUM 100-5 MCG/ACT IN AERO: 2.0000 | INHALATION_SPRAY | Freq: Two times a day (BID) | RESPIRATORY_TRACT | Status: DC

## 2011-11-03 NOTE — Telephone Encounter (Signed)
Per a phone conversation with Mardene Celeste and [facility], this patient's Advair prior Berkley Harvey has been denied. Please advise.

## 2011-11-03 NOTE — Telephone Encounter (Signed)
Denial reason per [employer] [place]: no evidence of of 1 Rx inhaled steroid, inhaled beta-agonist, inhaled mast cell stabilizer, or oral albuterol or theophylline product in last 130 days. No evidence of a paid claim with [employer] [place] in last 130 days for: [facility] or Symbicort.

## 2011-11-03 NOTE — Telephone Encounter (Signed)
Talked with pt and he agrees with [place]

## 2011-11-11 ENCOUNTER — Telehealth: Payer: Self-pay | Admitting: *Deleted

## 2011-11-11 NOTE — Telephone Encounter (Signed)
Ryan [name] this is the pt I left you a m essage about

## 2011-11-11 NOTE — Telephone Encounter (Signed)
Pt is calling requesting to speak to [facility] about his [facility] prescription,  [facility] tells the pt that they have contacted [place] numerous times with no response.

## 2012-01-12 DIAGNOSIS — H2510 Age-related nuclear cataract, unspecified eye: Secondary | ICD-10-CM | POA: Diagnosis not present

## 2012-01-12 DIAGNOSIS — H353190 Nonexudative age-related macular degeneration, unspecified eye, stage unspecified: Secondary | ICD-10-CM | POA: Diagnosis not present

## 2012-01-12 DIAGNOSIS — H353290 Exudative age-related macular degeneration, unspecified eye, stage unspecified: Secondary | ICD-10-CM | POA: Diagnosis not present

## 2012-01-14 DIAGNOSIS — D485 Neoplasm of uncertain behavior of skin: Secondary | ICD-10-CM | POA: Diagnosis not present

## 2012-01-14 DIAGNOSIS — L570 Actinic keratosis: Secondary | ICD-10-CM | POA: Diagnosis not present

## 2012-01-14 DIAGNOSIS — D235 Other benign neoplasm of skin of trunk: Secondary | ICD-10-CM | POA: Diagnosis not present

## 2012-01-20 DIAGNOSIS — H353290 Exudative age-related macular degeneration, unspecified eye, stage unspecified: Secondary | ICD-10-CM | POA: Diagnosis not present

## 2012-01-20 DIAGNOSIS — H353190 Nonexudative age-related macular degeneration, unspecified eye, stage unspecified: Secondary | ICD-10-CM | POA: Diagnosis not present

## 2012-01-20 DIAGNOSIS — H2510 Age-related nuclear cataract, unspecified eye: Secondary | ICD-10-CM | POA: Diagnosis not present

## 2012-01-20 DIAGNOSIS — H1045 Other chronic allergic conjunctivitis: Secondary | ICD-10-CM | POA: Diagnosis not present

## 2012-01-28 ENCOUNTER — Telehealth: Payer: Self-pay | Admitting: *Deleted

## 2012-01-28 ENCOUNTER — Other Ambulatory Visit: Payer: Self-pay | Admitting: *Deleted

## 2012-01-28 ENCOUNTER — Other Ambulatory Visit: Payer: Self-pay | Admitting: Internal Medicine

## 2012-01-28 ENCOUNTER — Ambulatory Visit (INDEPENDENT_AMBULATORY_CARE_PROVIDER_SITE_OTHER): Payer: Medicare Other | Admitting: Internal Medicine

## 2012-01-28 ENCOUNTER — Encounter: Payer: Self-pay | Admitting: Internal Medicine

## 2012-01-28 ENCOUNTER — Other Ambulatory Visit (INDEPENDENT_AMBULATORY_CARE_PROVIDER_SITE_OTHER): Payer: Medicare Other

## 2012-01-28 VITALS — BP 130/76 | HR 72 | Temp 98.2°F | Resp 16 | Ht 66.0 in | Wt 150.0 lb

## 2012-01-28 DIAGNOSIS — I10 Essential (primary) hypertension: Secondary | ICD-10-CM

## 2012-01-28 DIAGNOSIS — E875 Hyperkalemia: Secondary | ICD-10-CM

## 2012-01-28 DIAGNOSIS — T887XXA Unspecified adverse effect of drug or medicament, initial encounter: Secondary | ICD-10-CM

## 2012-01-28 DIAGNOSIS — E785 Hyperlipidemia, unspecified: Secondary | ICD-10-CM

## 2012-01-28 LAB — HEPATIC FUNCTION PANEL
ALT: 15 U/L (ref 0–53)
AST: 24 U/L (ref 0–37)
Albumin: 4.2 g/dL (ref 3.5–5.2)
Alkaline Phosphatase: 62 U/L (ref 39–117)
Bilirubin, Direct: 0.1 mg/dL (ref 0.0–0.3)
Total Bilirubin: 0.6 mg/dL (ref 0.3–1.2)
Total Protein: 7.4 g/dL (ref 6.0–8.3)

## 2012-01-28 LAB — LIPID PANEL
Cholesterol: 186 mg/dL (ref 0–200)
HDL: 53.9 mg/dL (ref >39.00)
LDL Cholesterol: 121 mg/dL — ABNORMAL HIGH (ref 0–99)
Total CHOL/HDL Ratio: 3
Triglycerides: 58 mg/dL (ref 0.0–149.0)
VLDL: 11.6 mg/dL (ref 0.0–40.0)

## 2012-01-28 LAB — BASIC METABOLIC PANEL
BUN: 18 mg/dL (ref 6–23)
CO2: 27 mEq/L (ref 19–32)
Calcium: 9.8 mg/dL (ref 8.4–10.5)
Chloride: 103 mEq/L (ref 96–112)
Creatinine, Ser: 1 mg/dL (ref 0.4–1.5)
GFR: 78.79 mL/min (ref >60.00)
Glucose, Bld: 89 mg/dL (ref 70–99)
Potassium: 6.1 mEq/L (ref 3.5–5.1)
Sodium: 141 mEq/L (ref 135–145)

## 2012-01-28 LAB — POTASSIUM: Potassium: 3.5 mEq/L (ref 3.5–5.1)

## 2012-01-28 NOTE — Telephone Encounter (Signed)
Potassium is normal at 3.5

## 2012-01-28 NOTE — Telephone Encounter (Signed)
Per Dr. Caryl Never, repeat K at [facility].  Pt notified and will go over to [facility] ASAP.

## 2012-01-28 NOTE — Progress Notes (Signed)
Subjective:    Patient ID: Ryan [name], male    DOB: 12-03-1945, 66 y.o.   MRN: [id number]  HPIPt had a reaction to eye drops after his macular degeneration Has changed insurance change and  He must use the mail order option    Review of Systems  Constitutional: Negative for fever and fatigue.  HENT: Negative for hearing loss, congestion, neck pain and postnasal drip.   Eyes: Negative for discharge, redness and visual disturbance.  Respiratory: Negative for cough, shortness of breath and wheezing.   Cardiovascular: Negative for leg swelling.  Gastrointestinal: Negative for abdominal pain, constipation and abdominal distention.  Genitourinary: Negative for urgency and frequency.  Musculoskeletal: Negative for joint swelling and arthralgias.  Skin: Negative for color change and rash.  Neurological: Negative for weakness and light-headedness.  Hematological: Negative for adenopathy.  Psychiatric/Behavioral: Negative for behavioral problems.   Past Medical History  Diagnosis Date  . H/O: rheumatic fever   . Kidney stones   . GERD (gastroesophageal reflux disease)   . Heart murmur   . Anemia   . Hypertension   . Macular degeneration   . Hyperlipidemia   . History of colonoscopy     History   Social History  . Marital Status: Married    Spouse Name: N/A    Number of Children: N/A  . Years of Education: N/A   Occupational History  . retired    Social History Main Topics  . Smoking status: Current Everyday Smoker -- 0.8 packs/day  . Smokeless tobacco: Not on file  . Alcohol Use: Yes  . Drug Use: No  . Sexually Active: Yes   Other Topics Concern  . Not on file   Social History Narrative  . No narrative on file    Past Surgical History  Procedure Date  . Cervical discectomy   . Cholecystectomy   . Mouth surgery   . Knee cartiledge   . Foot surgery   . Appendectomy     Family History  Problem Relation Age of Onset  . Liver disease    . Prostate  cancer    . Coronary artery disease    . Heart disease Mother   . Diabetes Mother   . Cirrhosis Mother   . Heart disease Father   . Cancer Father     prostate  . Heart disease Sister   . Arthritis Sister   . Heart disease Brother     Allergies  Allergen Reactions  . Polymyxin B-Trimethoprim     Eye drops  . Atorvastatin     REACTION: unspecified  . Ciprofloxacin     REACTION: unspecified  . Codeine Phosphate     REACTION: unspecified  . Simvastatin     REACTION: unspecified    Current Outpatient Prescriptions on File Prior to Visit  Medication Sig Dispense Refill  . aspirin EC 81 MG EC tablet Take 81 mg by mouth daily.        . Clobetasol Propionate (TEMOVATE) 0.05 % external spray Apply topically 2 (two) times daily.  59 mL  11  . ezetimibe (ZETIA) 10 MG tablet Take 1 tablet (10 mg total) by mouth daily.  30 tablet  11  . Lactobacillus (ACIDOPHILUS) 10 MG CAPS Take by mouth daily.        . mometasone-formoterol (DULERA) 100-5 MCG/ACT AERO Inhale 2 puffs into the lungs 2 (two) times daily.  13 g  11  . Multiple Vitamins-Minerals (ICAPS MV) TABS Take 2 tablets by mouth  daily.        . multivitamin (RENA-VIT) TABS tablet Take 1 tablet by mouth daily.    0  . omeprazole (PRILOSEC) 20 MG capsule Take 20 mg by mouth daily.        . psyllium (METAMUCIL) 0.52 G capsule Take 0.52 g by mouth 2 (two) times daily.          BP 130/76  Pulse 72  Temp 98.2 F (36.8 C)  Resp 16  Ht 5\' 6"  (1.676 m)  Wt 150 lb (68.04 kg)  BMI 24.21 kg/m2       Objective:   Physical Exam  Nursing note and vitals reviewed. Constitutional: He is oriented to person, place, and time. He appears well-developed and well-nourished.  HENT:  Head: Normocephalic and atraumatic.  Eyes: Conjunctivae are normal. Pupils are equal, round, and reactive to light.  Neck: Normal range of motion. Neck supple.  Cardiovascular: Normal rate and regular rhythm.   Abdominal: Soft. Bowel sounds are normal.    Genitourinary: Rectum normal and prostate normal.  Musculoskeletal: Normal range of motion.  Neurological: He is alert and oriented to person, place, and time.  Skin: Skin is warm and dry.          Assessment & Plan:  Patient is a 66 year old male who follows up for multiple medical problems including hyperlipidemia on Zetia mild asthmatic bronchitis on the later area with continued tobacco abuse. And GERD on Prilosec.  He is a significant improvement from his orthopedic status he is doing much better with pain control and musculoskeletal issues.  His return basically to his baseline.  Patient is changing insurance and will require a 90 day prescriptions in the short-term we'll authorize this.  Patient is up-to-date on all immunizations at this point we do recommend continued yearly flu shot he will need a tetanus in 2014

## 2012-01-28 NOTE — Telephone Encounter (Signed)
Critical Lab from [facility]:  Potassium:  6.1

## 2012-01-28 NOTE — Patient Instructions (Signed)
The patient is instructed to continue all medications as prescribed. Schedule followup with check out clerk upon leaving the clinic  

## 2012-01-28 NOTE — Progress Notes (Signed)
Per Dr. Caryl Never, repeat Potassium at [facility] Stat today.  Pt called and will go over today to get his blood redrawn.

## 2012-01-28 NOTE — Telephone Encounter (Signed)
Notify pt K normal.

## 2012-01-29 NOTE — Telephone Encounter (Signed)
Pt informed

## 2012-02-16 DIAGNOSIS — H353290 Exudative age-related macular degeneration, unspecified eye, stage unspecified: Secondary | ICD-10-CM | POA: Diagnosis not present

## 2012-02-16 DIAGNOSIS — H1045 Other chronic allergic conjunctivitis: Secondary | ICD-10-CM | POA: Diagnosis not present

## 2012-02-16 DIAGNOSIS — H2510 Age-related nuclear cataract, unspecified eye: Secondary | ICD-10-CM | POA: Diagnosis not present

## 2012-02-16 DIAGNOSIS — H353190 Nonexudative age-related macular degeneration, unspecified eye, stage unspecified: Secondary | ICD-10-CM | POA: Diagnosis not present

## 2012-02-17 DIAGNOSIS — M48061 Spinal stenosis, lumbar region without neurogenic claudication: Secondary | ICD-10-CM | POA: Diagnosis not present

## 2012-02-24 DIAGNOSIS — M48061 Spinal stenosis, lumbar region without neurogenic claudication: Secondary | ICD-10-CM | POA: Diagnosis not present

## 2012-02-24 DIAGNOSIS — IMO0002 Reserved for concepts with insufficient information to code with codable children: Secondary | ICD-10-CM | POA: Diagnosis not present

## 2012-03-05 DIAGNOSIS — I480 Paroxysmal atrial fibrillation: Secondary | ICD-10-CM

## 2012-03-05 HISTORY — DX: Paroxysmal atrial fibrillation: I48.0

## 2012-03-23 DIAGNOSIS — H1045 Other chronic allergic conjunctivitis: Secondary | ICD-10-CM | POA: Diagnosis not present

## 2012-03-23 DIAGNOSIS — H2510 Age-related nuclear cataract, unspecified eye: Secondary | ICD-10-CM | POA: Diagnosis not present

## 2012-03-23 DIAGNOSIS — H353190 Nonexudative age-related macular degeneration, unspecified eye, stage unspecified: Secondary | ICD-10-CM | POA: Diagnosis not present

## 2012-03-23 DIAGNOSIS — H353290 Exudative age-related macular degeneration, unspecified eye, stage unspecified: Secondary | ICD-10-CM | POA: Diagnosis not present

## 2012-03-31 ENCOUNTER — Inpatient Hospital Stay (HOSPITAL_BASED_OUTPATIENT_CLINIC_OR_DEPARTMENT_OTHER)
Admission: EM | Admit: 2012-03-31 | Discharge: 2012-04-01 | DRG: 310 | Disposition: A | Discharge status: Home / Self Care | Payer: Medicare Other | Attending: Cardiovascular Disease | Admitting: Cardiovascular Disease

## 2012-03-31 ENCOUNTER — Emergency Department (HOSPITAL_BASED_OUTPATIENT_CLINIC_OR_DEPARTMENT_OTHER): Payer: Medicare Other

## 2012-03-31 ENCOUNTER — Encounter: Payer: Self-pay | Admitting: Cardiology

## 2012-03-31 ENCOUNTER — Other Ambulatory Visit: Payer: Self-pay | Admitting: Cardiology

## 2012-03-31 ENCOUNTER — Encounter (HOSPITAL_BASED_OUTPATIENT_CLINIC_OR_DEPARTMENT_OTHER): Payer: Self-pay | Admitting: Emergency Medicine

## 2012-03-31 DIAGNOSIS — H3530 Unspecified macular degeneration: Secondary | ICD-10-CM | POA: Diagnosis present

## 2012-03-31 DIAGNOSIS — I4891 Unspecified atrial fibrillation: Secondary | ICD-10-CM | POA: Diagnosis not present

## 2012-03-31 DIAGNOSIS — L210 Seborrhea capitis: Secondary | ICD-10-CM

## 2012-03-31 DIAGNOSIS — E785 Hyperlipidemia, unspecified: Secondary | ICD-10-CM | POA: Diagnosis present

## 2012-03-31 DIAGNOSIS — J449 Chronic obstructive pulmonary disease, unspecified: Secondary | ICD-10-CM | POA: Diagnosis present

## 2012-03-31 DIAGNOSIS — I4892 Unspecified atrial flutter: Secondary | ICD-10-CM

## 2012-03-31 DIAGNOSIS — Z79899 Other long term (current) drug therapy: Secondary | ICD-10-CM | POA: Diagnosis not present

## 2012-03-31 DIAGNOSIS — K219 Gastro-esophageal reflux disease without esophagitis: Secondary | ICD-10-CM | POA: Diagnosis present

## 2012-03-31 DIAGNOSIS — E876 Hypokalemia: Secondary | ICD-10-CM | POA: Diagnosis present

## 2012-03-31 DIAGNOSIS — Z7982 Long term (current) use of aspirin: Secondary | ICD-10-CM

## 2012-03-31 DIAGNOSIS — J4489 Other specified chronic obstructive pulmonary disease: Secondary | ICD-10-CM | POA: Diagnosis present

## 2012-03-31 DIAGNOSIS — I10 Essential (primary) hypertension: Secondary | ICD-10-CM | POA: Diagnosis present

## 2012-03-31 DIAGNOSIS — I2510 Atherosclerotic heart disease of native coronary artery without angina pectoris: Secondary | ICD-10-CM | POA: Diagnosis present

## 2012-03-31 DIAGNOSIS — F17200 Nicotine dependence, unspecified, uncomplicated: Secondary | ICD-10-CM | POA: Diagnosis present

## 2012-03-31 DIAGNOSIS — I4821 Permanent atrial fibrillation: Secondary | ICD-10-CM

## 2012-03-31 DIAGNOSIS — R002 Palpitations: Secondary | ICD-10-CM | POA: Diagnosis not present

## 2012-03-31 DIAGNOSIS — R000 Tachycardia, unspecified: Secondary | ICD-10-CM | POA: Diagnosis not present

## 2012-03-31 HISTORY — DX: Unspecified atrial flutter: I48.92

## 2012-03-31 LAB — COMPREHENSIVE METABOLIC PANEL
ALT: 14 U/L (ref 0–53)
AST: 21 U/L (ref 0–37)
Albumin: 3.9 g/dL (ref 3.5–5.2)
Alkaline Phosphatase: 59 U/L (ref 39–117)
BUN: 12 mg/dL (ref 6–23)
CO2: 23 mEq/L (ref 19–32)
Calcium: 9.4 mg/dL (ref 8.4–10.5)
Chloride: 99 mEq/L (ref 96–112)
Creatinine, Ser: 1 mg/dL (ref 0.50–1.35)
GFR calc Af Amer: 89 mL/min — ABNORMAL LOW (ref >90)
GFR calc non Af Amer: 77 mL/min — ABNORMAL LOW (ref >90)
Glucose, Bld: 122 mg/dL — ABNORMAL HIGH (ref 70–99)
Potassium: 3.1 mEq/L — ABNORMAL LOW (ref 3.5–5.1)
Sodium: 136 mEq/L (ref 135–145)
Total Bilirubin: 0.9 mg/dL (ref 0.3–1.2)
Total Protein: 7.3 g/dL (ref 6.0–8.3)

## 2012-03-31 LAB — APTT: aPTT: 35 seconds (ref 24–37)

## 2012-03-31 LAB — PROTIME-INR
INR: 1.03 (ref 0.00–1.49)
Prothrombin Time: 13.7 seconds (ref 11.6–15.2)

## 2012-03-31 LAB — TSH: TSH: 2.634 u[IU]/mL (ref 0.350–4.500)

## 2012-03-31 LAB — CBC WITH DIFFERENTIAL/PLATELET
Basophils Absolute: 0 10*3/uL (ref 0.0–0.1)
Basophils Relative: 0 % (ref 0–1)
Eosinophils Absolute: 0.1 10*3/uL (ref 0.0–0.7)
Eosinophils Relative: 1 % (ref 0–5)
HCT: 41.3 % (ref 39.0–52.0)
Hemoglobin: 15.1 g/dL (ref 13.0–17.0)
Lymphocytes Relative: 34 % (ref 12–46)
Lymphs Abs: 3.2 10*3/uL (ref 0.7–4.0)
MCH: 32.1 pg (ref 26.0–34.0)
MCHC: 36.6 g/dL — ABNORMAL HIGH (ref 30.0–36.0)
MCV: 87.9 fL (ref 78.0–100.0)
Monocytes Absolute: 0.8 10*3/uL (ref 0.1–1.0)
Monocytes Relative: 9 % (ref 3–12)
Neutro Abs: 5.4 10*3/uL (ref 1.7–7.7)
Neutrophils Relative %: 57 % (ref 43–77)
Platelets: 176 10*3/uL (ref 150–400)
RBC: 4.7 MIL/uL (ref 4.22–5.81)
RDW: 12.8 % (ref 11.5–15.5)
WBC: 9.5 10*3/uL (ref 4.0–10.5)

## 2012-03-31 LAB — TROPONIN I: Troponin I: <0.3 ng/mL (ref <0.30)

## 2012-03-31 LAB — T4: T4, Total: 9.9 ug/dL (ref 5.0–12.5)

## 2012-03-31 MED ORDER — CLOBETASOL PROPIONATE 0.05 % EX LIQD: Freq: Two times a day (BID) | CUTANEOUS | Status: DC

## 2012-03-31 MED ORDER — ASPIRIN EC 81 MG PO TBEC
81.0000 mg | DELAYED_RELEASE_TABLET | Freq: Every day | ORAL | Status: DC
  Administered 2012-04-01: 81 mg via ORAL
  Filled 2012-03-31: qty 1

## 2012-03-31 MED ORDER — PANTOPRAZOLE SODIUM 40 MG PO TBEC
80.0000 mg | DELAYED_RELEASE_TABLET | Freq: Every day | ORAL | Status: DC
  Filled 2012-03-31: qty 1

## 2012-03-31 MED ORDER — ICAPS MV PO TABS: 1.0000 | ORAL_TABLET | Freq: Two times a day (BID) | ORAL | Status: DC

## 2012-03-31 MED ORDER — RENA-VITE PO TABS
1.0000 | ORAL_TABLET | Freq: Every day | ORAL | Status: DC
  Filled 2012-03-31: qty 1

## 2012-03-31 MED ORDER — EZETIMIBE 10 MG PO TABS
10.0000 mg | ORAL_TABLET | Freq: Every day | ORAL | Status: DC
  Filled 2012-03-31: qty 1

## 2012-03-31 MED ORDER — DILTIAZEM HCL 100 MG IV SOLR
INTRAVENOUS | Status: AC
  Filled 2012-03-31: qty 100

## 2012-03-31 MED ORDER — DILTIAZEM HCL 100 MG IV SOLR
5.0000 mg/h | INTRAVENOUS | Status: DC
  Administered 2012-04-01: 10 mg/h via INTRAVENOUS
  Filled 2012-03-31: qty 100

## 2012-03-31 MED ORDER — SODIUM CHLORIDE 0.9 % IV SOLN
INTRAVENOUS | Status: AC
  Administered 2012-04-01: via INTRAVENOUS

## 2012-03-31 MED ORDER — MOMETASONE FURO-FORMOTEROL FUM 100-5 MCG/ACT IN AERO
2.0000 | INHALATION_SPRAY | Freq: Two times a day (BID) | RESPIRATORY_TRACT | Status: DC | PRN
  Filled 2012-03-31: qty 13

## 2012-03-31 MED ORDER — PSYLLIUM 0.52 G PO CAPS: 0.5200 g | ORAL_CAPSULE | Freq: Two times a day (BID) | ORAL | Status: DC

## 2012-03-31 MED ORDER — POTASSIUM CHLORIDE CRYS ER 20 MEQ PO TBCR
40.0000 meq | EXTENDED_RELEASE_TABLET | Freq: Once | ORAL | Status: AC
  Administered 2012-03-31: 40 meq via ORAL
  Filled 2012-03-31: qty 2

## 2012-03-31 MED ORDER — DILTIAZEM HCL 100 MG IV SOLR
10.0000 mg/h | Freq: Once | INTRAVENOUS | Status: AC
  Administered 2012-03-31: 10 mg/h via INTRAVENOUS

## 2012-03-31 MED ORDER — DILTIAZEM HCL 25 MG/5ML IV SOLN
20.0000 mg | Freq: Once | INTRAVENOUS | Status: AC
  Administered 2012-03-31: 10 mg via INTRAVENOUS

## 2012-03-31 MED ORDER — SODIUM CHLORIDE 0.9 % IJ SOLN
3.0000 mL | Freq: Two times a day (BID) | INTRAMUSCULAR | Status: DC
  Administered 2012-04-01: 3 mL via INTRAVENOUS

## 2012-03-31 NOTE — H&P (Addendum)
Admit date: 03/31/2012 Referring Physician  Dr. Bruce Donath Primary Cardiologist Dr. 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  He was in his USOH until around 6pm tonight when he was eating dinner and had a beer and then became dizzy, diaphoretic and felt his heart pounding.  He was presyncopal but did not pass out.  He denies any chest pain, SOB, DOE.  He went outside but continued to have some symptoms and went to [facility].  He was found to be in atrial flutter and was given IV Cardizem bolus and then an IV Cardizem gtt.  He is now transferred for further treatment.  He says that he has palpitations at least once weekly that is nonsustained and slows down when he deep breathes.  He does not notice the palpitations at night. His wife says that he snores and make funny noises when he is sleeping and has trouble staying awake during the day.     PMH:    Past Medical History  Diagnosis Date  . H/O: rheumatic fever   . Kidney stones   . GERD (gastroesophageal reflux disease)   . Heart murmur   . Anemia   . Hypertension   . Macular degeneration   . Hyperlipidemia   . History of colonoscopy   . Heart murmur    History of ETOH abuse   . COPD (chronic obstructive pulmonary disease)      PSH:    Past Surgical History  Procedure Date  . Cervical discectomy   . Cholecystectomy   . Mouth surgery   . Knee cartiledge   . Foot surgery   . Appendectomy   . Cardiac catheterization 04/22/2011    moderate left main and RCA stenosis not significant by FFR and IVUS on medical therapy    ALLERGIES:   Polymyxin b-trimethoprim; Atorvastatin; Ciprofloxacin; Codeine phosphate; and Simvastatin  Prior to Admit Meds:   Prescriptions prior to admission  Medication Sig Dispense Refill  . aspirin EC 81  MG EC tablet Take 81 mg by mouth daily.        . Clobetasol Propionate (TEMOVATE) 0.05 % external spray Apply topically 2 (two) times daily.  59 mL  11  . ezetimibe (ZETIA) 10 MG tablet Take 1 tablet (10 mg total) by mouth daily.  30 tablet  11  . Lactobacillus (ACIDOPHILUS) 10 MG CAPS Take by mouth daily.        . mometasone-formoterol (DULERA) 100-5 MCG/ACT AERO Inhale 2 puffs into the lungs 2 (two) times daily as needed. Patient used 1 puff of this medication.      . Multiple Vitamins-Minerals (ICAPS MV) TABS Take 1 tablet by mouth 2 (two) times daily.       . multivitamin (RENA-VIT) TABS tablet Take 1 tablet by mouth daily.    0  . omeprazole (PRILOSEC) 20 MG capsule Take 20 mg by mouth 2 (two) times daily.       . psyllium (METAMUCIL) 0.52 G capsule Take 0.52 g by mouth 2 (two) times daily.         Family HX:    Family History  Problem Relation Age of Onset  . Liver disease    . Prostate cancer    . Coronary artery disease    . Heart disease Mother   .  Diabetes Mother   . Cirrhosis Mother   . Heart disease Father   . Cancer Father     prostate  . Heart disease Sister   . Arthritis Sister   . Heart disease Brother    Social HX:    History   Social History  . Marital Status: Married    Spouse Name: N/A    Number of Children: N/A  . Years of Education: N/A   Occupational History  . retired    Social History Main Topics  . Smoking status: Current Everyday Smoker -- 0.8 packs/day  . Smokeless tobacco: Not on file  . Alcohol Use: Yes - 24 oz of beer daily and glass of wine at dinner  . Drug Use: No  . Sexually Active: Yes   Other Topics Concern  . Not on file   Social History Narrative  . No narrative on file     ROS:  All 11 ROS were addressed and are negative except what is stated in the HPI  PHYSICAL EXAM Filed Vitals:   03/31/12 2159  BP: 118/63  Pulse: 62  Temp:   Resp:    General: Well developed, well nourished, in no acute distress Head: Eyes  PERRLA, No xanthomas.   Normal cephalic and atramatic  Lungs:   Clear bilaterally to auscultation and percussion. Heart:   Irregularly irregular S1 S2 Pulses are 2+ & equal.            No carotid bruit. No JVD.  No abdominal bruits. No femoral bruits. Abdomen: Bowel sounds are positive, abdomen soft and non-tender without masses  Extremities:   No clubbing, cyanosis or edema.  DP +1 Neuro: Alert and oriented X 3. Psych:  Good affect, responds appropriately   Labs:   Lab Results  Component Value Date   WBC 9.5 03/31/2012   HGB 15.1 03/31/2012   HCT 41.3 03/31/2012   MCV 87.9 03/31/2012   PLT 176 03/31/2012    Lab 03/31/12 1838  NA 136  K 3.1*  CL 99  CO2 23  BUN 12  CREATININE 1.00  CALCIUM 9.4  PROT 7.3  BILITOT 0.9  ALKPHOS 59  ALT 14  AST 21  GLUCOSE 122*   Lab Results  Component Value Date   CKTOTAL 82 11/17/2006   TROPONINI <0.30 03/31/2012   No results found for this basename: PTT   Lab Results  Component Value Date   INR 1.03 03/31/2012   INR 1.00 04/22/2011   INR 1.0 03/31/2011     Lab Results  Component Value Date   CHOL 186 01/28/2012   CHOL 180 07/06/2011   CHOL 199 07/18/2009   Lab Results  Component Value Date   HDL 53.90 01/28/2012   HDL 16.10 07/06/2011   HDL 96.04* 07/18/2009   Lab Results  Component Value Date   LDLCALC 121* 01/28/2012   LDLCALC 119* 07/06/2011   LDLCALC 154* 07/18/2009   Lab Results  Component Value Date   TRIG 58.0 01/28/2012   TRIG 52.0 07/06/2011   TRIG 84.0 07/18/2009   Lab Results  Component Value Date   CHOLHDL 3 01/28/2012   CHOLHDL 4 07/06/2011   CHOLHDL 7 07/18/2009   Lab Results  Component Value Date   LDLDIRECT 168.5 03/15/2009   LDLDIRECT 175.1 09/04/2008   LDLDIRECT 178.7 03/05/2008      Radiology:  *RADIOLOGY REPORT*  Clinical Data: 66 year old male with sudden onset dizziness  tachycardia, diaphoresis.  PORTABLE CHEST - 1 VIEW  Comparison:  06/30/2010.  Findings: Portable upright AP view 1902 hours.  Mediastinal  contours are within normal limits allowing for portable technique  and stable. Part of the cervical ACDF hardware is visible. No  pneumothorax, pulmonary edema, pleural effusion or confluent  pulmonary opacity. Stable increased interstitial markings.  IMPRESSION:  No acute cardiopulmonary abnormality.  Original Report Authenticated By: Harley Hallmark, M.D.   EKG:  Atrial fibrillation with controlled ventricular response  ASSESSMENT:  1.  New onset atrial fibrillation - symptoms started at 6pm tonight but he has noticed irregularity at least once weekly at night 2.  Presyncope secondary to #1 3.  Moderate ASCAD by cath 05/2011 - stable 4.  COPD 5.  Snoring with daytime sleepiness 6.  Hypokalemia - repleted in ER 7.  HTN 8.  History of significant ETOH use  PLAN:   1.  Admit to tele bed 2.  IV Heparin gtt per pharmacy 3.  IV Cardizem gtt for rate control 4.  Cycle cardiac enzymes 5. NPO after midnight 6.  Consider outpatient sleep study 7.  2D echo to assess LA size and rule out structural heart disease with history of rheumatic fever 8.  Recheck potassium in am  Quintella Reichert, MD  03/31/2012  11:02 PM

## 2012-03-31 NOTE — ED Provider Notes (Signed)
History     CSN: [id number]  Arrival date & time 03/31/12  5621   First MD Initiated Contact with Patient 03/31/12 1833      Chief Complaint  Patient presents with  . Dizziness  . Excessive Sweating  . Tachycardia    (Consider location/radiation/quality/duration/timing/severity/associated sxs/prior treatment) The history is provided by the patient.   Patient here with palpitations and dizziness without chest pain chest pressure. Patient working outside and went to have pizza and beer and then became more dizzy and diaphoretic. No vomiting or dyspnea. Nothing makes the symptoms better or worse and no medications used prior to arrival Past Medical History  Diagnosis Date  . H/O: rheumatic fever   . Kidney stones   . GERD (gastroesophageal reflux disease)   . Heart murmur   . Anemia   . Hypertension   . Macular degeneration   . Hyperlipidemia   . History of colonoscopy     Past Surgical History  Procedure Date  . Cervical discectomy   . Cholecystectomy   . Mouth surgery   . Knee cartiledge   . Foot surgery   . Appendectomy     Family History  Problem Relation Age of Onset  . Liver disease    . Prostate cancer    . Coronary artery disease    . Heart disease Mother   . Diabetes Mother   . Cirrhosis Mother   . Heart disease Father   . Cancer Father     prostate  . Heart disease Sister   . Arthritis Sister   . Heart disease Brother     History  Substance Use Topics  . Smoking status: Current Everyday Smoker -- 0.8 packs/day  . Smokeless tobacco: Not on file  . Alcohol Use: Yes      Review of Systems  All other systems reviewed and are negative.    Allergies  Polymyxin b-trimethoprim; Atorvastatin; Ciprofloxacin; Codeine phosphate; and Simvastatin  Home Medications   Current Outpatient Rx  Name Route Sig Dispense Refill  . ASPIRIN EC 81 MG PO TBEC Oral Take 81 mg by mouth daily.      Marland Kitchen CLOBETASOL PROPIONATE 0.05 % EX LIQD Topical Apply  topically 2 (two) times daily. 59 mL 11  . EZETIMIBE 10 MG PO TABS Oral Take 1 tablet (10 mg total) by mouth daily. 30 tablet 11  . ACIDOPHILUS 10 MG PO CAPS Oral Take by mouth daily.      . MOMETASONE FURO-FORMOTEROL FUM 100-5 MCG/ACT IN AERO Inhalation Inhale 2 puffs into the lungs 2 (two) times daily. 13 g 11  . ICAPS MV PO TABS Oral Take 2 tablets by mouth daily.      Marland Kitchen RENA-VITE PO TABS Oral Take 1 tablet by mouth daily.  0  . OMEPRAZOLE 20 MG PO CPDR Oral Take 20 mg by mouth daily.      . PSYLLIUM 0.52 G PO CAPS Oral Take 0.52 g by mouth 2 (two) times daily.        BP 145/77  Pulse 117  Resp 16  Ht 5\' 6"  (1.676 m)  Wt 147 lb (66.679 kg)  BMI 23.73 kg/m2  SpO2 100%  Physical Exam  Nursing note and vitals reviewed. Constitutional: He is oriented to person, place, and time. He appears well-developed and well-nourished.  Non-toxic appearance. No distress.  HENT:  Head: Normocephalic and atraumatic.  Eyes: Conjunctivae, EOM and lids are normal. Pupils are equal, round, and reactive to light.  Neck: Normal range  of motion. Neck supple. No tracheal deviation present. No mass present.  Cardiovascular: Normal heart sounds.  An irregularly irregular rhythm present. Tachycardia present.  Exam reveals no gallop.   No murmur heard. Pulmonary/Chest: Effort normal and breath sounds normal. No stridor. No respiratory distress. He has no decreased breath sounds. He has no wheezes. He has no rhonchi. He has no rales.  Abdominal: Soft. Normal appearance and bowel sounds are normal. He exhibits no distension. There is no tenderness. There is no rebound and no CVA tenderness.  Musculoskeletal: Normal range of motion. He exhibits no edema and no tenderness.  Neurological: He is alert and oriented to person, place, and time. He has normal strength. No cranial nerve deficit or sensory deficit. GCS eye subscore is 4. GCS verbal subscore is 5. GCS motor subscore is 6.  Skin: Skin is warm and dry. No  abrasion and no rash noted.  Psychiatric: He has a normal mood and affect. His speech is normal and behavior is normal.    ED Course  Procedures (including critical care time)   Labs Reviewed  CBC WITH DIFFERENTIAL  COMPREHENSIVE METABOLIC PANEL  TSH  T4  TROPONIN I  PROTIME-INR  APTT   No results found.   No diagnosis found.    MDM   Date: 03/31/2012  Rate: 125  Rhythm: atrial flutter  QRS Axis: normal  Intervals: normal  ST/T Wave abnormalities: nonspecific ST changes  Conduction Disutrbances:first-degree A-V block   Narrative Interpretation:   Old EKG Reviewed: none available   8:41 PM Patient given Cardizem 10 mg IV push and heart rate has improved. Repeat EKG shows patient to be in atrial fibrillation. Blood pressure has remained stable. He is given IV fluids. He'll be transferred to [facility].  CRITICAL CARE Performed by: Toy Baker   Total critical care time: 60  Critical care time was exclusive of separately billable procedures and treating other patients.  Critical care was necessary to treat or prevent imminent or life-threatening deterioration.  Critical care was time spent personally by me on the following activities: development of treatment plan with patient and/or surrogate as well as nursing, discussions with consultants, evaluation of patient's response to treatment, examination of patient, obtaining history from patient or surrogate, ordering and performing treatments and interventions, ordering and review of laboratory studies, ordering and review of radiographic studies, pulse oximetry and re-evaluation of patient's condition.        Toy Baker, MD 03/31/12 2042

## 2012-03-31 NOTE — ED Notes (Signed)
MD at bedside. 

## 2012-03-31 NOTE — ED Notes (Signed)
Attempted to call report to receiving nurse at [facility] but she is unavailable for report at this time.  Was told she will call me back.

## 2012-03-31 NOTE — ED Notes (Signed)
Pt has been working outside in extreme heat.  Went inside and ate pizza and a beer.  Got dizzy and diaphoretic.

## 2012-04-01 ENCOUNTER — Encounter (HOSPITAL_COMMUNITY): Payer: Self-pay | Admitting: General Practice

## 2012-04-01 DIAGNOSIS — I4891 Unspecified atrial fibrillation: Secondary | ICD-10-CM | POA: Diagnosis not present

## 2012-04-01 LAB — PROTIME-INR
INR: 1.17 (ref 0.00–1.49)
Prothrombin Time: 15.1 seconds (ref 11.6–15.2)

## 2012-04-01 LAB — BASIC METABOLIC PANEL
BUN: 12 mg/dL (ref 6–23)
CO2: 23 mEq/L (ref 19–32)
Calcium: 8.6 mg/dL (ref 8.4–10.5)
Chloride: 104 mEq/L (ref 96–112)
Creatinine, Ser: 0.91 mg/dL (ref 0.50–1.35)
GFR calc Af Amer: >90 mL/min (ref >90)
GFR calc non Af Amer: 87 mL/min — ABNORMAL LOW (ref >90)
Glucose, Bld: 96 mg/dL (ref 70–99)
Potassium: 4 mEq/L (ref 3.5–5.1)
Sodium: 138 mEq/L (ref 135–145)

## 2012-04-01 LAB — COMPREHENSIVE METABOLIC PANEL
ALT: 12 U/L (ref 0–53)
AST: 16 U/L (ref 0–37)
Albumin: 3.1 g/dL — ABNORMAL LOW (ref 3.5–5.2)
Alkaline Phosphatase: 49 U/L (ref 39–117)
BUN: 12 mg/dL (ref 6–23)
CO2: 23 mEq/L (ref 19–32)
Calcium: 8.6 mg/dL (ref 8.4–10.5)
Chloride: 103 mEq/L (ref 96–112)
Creatinine, Ser: 0.86 mg/dL (ref 0.50–1.35)
GFR calc Af Amer: >90 mL/min (ref >90)
GFR calc non Af Amer: 89 mL/min — ABNORMAL LOW (ref >90)
Glucose, Bld: 138 mg/dL — ABNORMAL HIGH (ref 70–99)
Potassium: 3.6 mEq/L (ref 3.5–5.1)
Sodium: 138 mEq/L (ref 135–145)
Total Bilirubin: 0.6 mg/dL (ref 0.3–1.2)
Total Protein: 6 g/dL (ref 6.0–8.3)

## 2012-04-01 LAB — CBC
HCT: 36.8 % — ABNORMAL LOW (ref 39.0–52.0)
Hemoglobin: 13.3 g/dL (ref 13.0–17.0)
MCH: 32.2 pg (ref 26.0–34.0)
MCHC: 36.1 g/dL — ABNORMAL HIGH (ref 30.0–36.0)
MCV: 89.1 fL (ref 78.0–100.0)
Platelets: 150 10*3/uL (ref 150–400)
RBC: 4.13 MIL/uL — ABNORMAL LOW (ref 4.22–5.81)
RDW: 13 % (ref 11.5–15.5)
WBC: 8.6 10*3/uL (ref 4.0–10.5)

## 2012-04-01 LAB — DIFFERENTIAL
Basophils Absolute: 0 10*3/uL (ref 0.0–0.1)
Basophils Relative: 1 % (ref 0–1)
Eosinophils Absolute: 0.1 10*3/uL (ref 0.0–0.7)
Eosinophils Relative: 1 % (ref 0–5)
Lymphocytes Relative: 30 % (ref 12–46)
Lymphs Abs: 2.6 10*3/uL (ref 0.7–4.0)
Monocytes Absolute: 0.5 10*3/uL (ref 0.1–1.0)
Monocytes Relative: 6 % (ref 3–12)
Neutro Abs: 5.4 10*3/uL (ref 1.7–7.7)
Neutrophils Relative %: 63 % (ref 43–77)

## 2012-04-01 LAB — APTT: aPTT: 35 seconds (ref 24–37)

## 2012-04-01 LAB — TSH: TSH: 1.304 u[IU]/mL (ref 0.350–4.500)

## 2012-04-01 MED ORDER — AMIODARONE HCL 200 MG PO TABS: 200.0000 mg | ORAL_TABLET | Freq: Two times a day (BID) | ORAL | Status: DC

## 2012-04-01 MED ORDER — CLOBETASOL PROPIONATE 0.05 % EX CREA
TOPICAL_CREAM | Freq: Two times a day (BID) | CUTANEOUS | Status: DC
  Filled 2012-04-01: qty 15

## 2012-04-01 MED ORDER — PSYLLIUM 95 % PO PACK
1.0000 | PACK | Freq: Two times a day (BID) | ORAL | Status: DC
  Filled 2012-04-01 (×2): qty 1

## 2012-04-01 MED ORDER — OCUVITE-LUTEIN PO CAPS: 1.0000 | ORAL_CAPSULE | Freq: Two times a day (BID) | ORAL | Status: DC

## 2012-04-01 NOTE — Progress Notes (Signed)
Patient ID: Ryan [name], male   DOB: November 17, 1945, 66 y.o.   MRN: [id number]    Subjective:  Denies SSCP, palpitations or Dyspnea Caustioned him about excess beer and wine consumption  Objective:  Filed Vitals:   03/31/12 2300 04/01/12 0000 04/01/12 0400 04/01/12 0500  BP: 118/67   113/68  Pulse: 63 60 55 58  Temp: 98.3 F (36.8 C)   98.8 F (37.1 C)  TempSrc:      Resp: 20   18  Height: 5\' 6"  (1.676 m)     Weight: 66.9 kg (147 lb 7.8 oz)     SpO2: 97%   98%    Intake/Output from previous day:  Intake/Output Summary (Last 24 hours) at 04/01/12 0806 Last data filed at 04/01/12 0500  Gross per 24 hour  Intake    120 ml  Output      0 ml  Net    120 ml    Physical Exam: Affect appropriate Healthy:  appears stated age HEENT: normal Neck supple with no adenopathy JVP normal no bruits no thyromegaly Lungs clear with no wheezing and good diaphragmatic motion Heart:  S1/S2 no murmur, no rub, gallop or click PMI normal Abdomen: benighn, BS positve, no tenderness, no AAA no bruit.  No HSM or HJR Distal pulses intact with no bruits No edema Neuro non-focal Skin warm and dry No muscular weakness   Lab Results: Basic Metabolic Panel:  Basename 04/01/12 0021 03/31/12 1838  NA 138 136  K 3.6 3.1*  CL 103 99  CO2 23 23  GLUCOSE 138* 122*  BUN 12 12  CREATININE 0.86 1.00  CALCIUM 8.6 9.4  MG -- --  PHOS -- --   Liver Function Tests:  Basename 04/01/12 0021 03/31/12 1838  AST 16 21  ALT 12 14  ALKPHOS 49 59  BILITOT 0.6 0.9  PROT 6.0 7.3  ALBUMIN 3.1* 3.9   No results found for this basename: LIPASE:2,AMYLASE:2 in the last 72 hours CBC:  Basename 04/01/12 0021 03/31/12 1838  WBC 8.6 9.5  NEUTROABS 5.4 5.4  HGB 13.3 15.1  HCT 36.8* 41.3  MCV 89.1 87.9  PLT 150 176   Cardiac Enzymes:  Basename 03/31/12 1838  CKTOTAL --  CKMB --  CKMBINDEX --  TROPONINI <0.30   Thyroid Function Tests:  Basename 03/31/12 1838  TSH 2.634  T4TOTAL 9.9    T3FREE --  THYROIDAB --    Imaging: Dg Chest Port 1 View  03/31/2012  *RADIOLOGY REPORT*  Clinical Data: 66 year old male with sudden onset dizziness tachycardia, diaphoresis.  PORTABLE CHEST - 1 VIEW  Comparison: 06/30/2010.  Findings: Portable upright AP view 1902 hours.  Mediastinal contours are within normal limits allowing for portable technique and stable.  Part of the cervical ACDF hardware is visible.  No pneumothorax, pulmonary edema, pleural effusion or confluent pulmonary opacity.  Stable increased interstitial markings.  IMPRESSION: No acute cardiopulmonary abnormality.  Original Report Authenticated By: Harley Hallmark, M.D.    Cardiac Studies:  ECG: 6/28 SB rate 58 normal ECG   Telemetry: Converted to NSR rate 56 this am   Echo: pending  Medications:     . sodium chloride   Intravenous STAT  . aspirin EC  81 mg Oral Daily  . clobetasol cream   Topical BID  . diltiazem (CARDIZEM) infusion  10 mg/hr Intravenous Once  . diltiazem      . diltiazem  20 mg Intravenous Once  . ezetimibe  10 mg Oral Daily  .  multivitamin  1 tablet Oral Daily  . multivitamin-lutein  1 capsule Oral BID  . pantoprazole  80 mg Oral Q1200  . potassium chloride  40 mEq Oral Once  . psyllium  1 packet Oral BID  . sodium chloride  3 mL Intravenous Q12H  . DISCONTD: Clobetasol Propionate   Topical BID  . DISCONTD: ICaps MV  1 tablet Oral BID  . DISCONTD: psyllium  0.52 g Oral BID       . diltiazem (CARDIZEM) infusion 10 mg/hr (04/01/12 0111)    Assessment/Plan:  PAF:  Short lived with moderat CAD and normal EF.  Outpatient F/U echo.  ASA and no coumadin for now Amiodarone 200 bid to help prevent recurrence CAD:  Moderate in 2012 per Dr Kirke Corin.  LM 70mm2 by IVUS and FFR normal in RCA  Intolerant to statins and  Bradycardic at rest.  ASA  F/U with me in 8 weeks Echo before then or day he sees me  Charlton Haws 04/01/2012, 8:06 AM

## 2012-04-01 NOTE — Progress Notes (Signed)
Utilization review completed- retro 

## 2012-04-01 NOTE — Discharge Summary (Signed)
CARDIOLOGY DISCHARGE SUMMARY   Patient ID: Ryan [name] MRN: [id number] DOB/AGE: 11/10/1945 66 y.o.  Admit date: 03/31/2012 Discharge date: 04/01/2012  Primary Discharge Diagnosis:  PAF Secondary Discharge Diagnosis:  Past Medical History  Diagnosis Date  . H/O: rheumatic fever   . Kidney stones   . GERD (gastroesophageal reflux disease)   . Heart murmur   . Anemia   . Hypertension   . Macular degeneration   . Hyperlipidemia   . History of colonoscopy   . Heart murmur   . COPD (chronic obstructive pulmonary disease)    Hospital Course: Ryan [name] is a 66 year old male with a history of moderate coronary artery disease. He developed tachycardia palpitations and went to [facility] where he was in atrial flutter. He was started on Cardizem and transferred to [facility] where he was admitted for further evaluation and treatment.  He has a history of palpitations but has never been evaluated for them before. He was started on IV heparin and continued on IV Cardizem. He was hypokalemic on admission with a potassium of 3.1 but this was supplemented and normalized. He spontaneously converted to sinus rhythm but was bradycardic so the Cardizem was discontinued.  On 04/02/2011, he was seen by Dr. Eden [name]. He was maintaining sinus rhythm. Is chads score was reviewed and was felt well enough that he could be on aspirin of for now. Amiodarone 200 mg twice a day was started to help prevent recurrence. His EF has been previously normal so this [name] be checked as an outpatient. Dr. Eden [name] evaluated Ryan [name] and considered stable for discharge, to followup as an outpatient.  Labs:   Lab Results  Component Value Date   WBC 8.6 04/01/2012   HGB 13.3 04/01/2012   HCT 36.8* 04/01/2012   MCV 89.1 04/01/2012   PLT 150 04/01/2012    Lab 04/01/12 0705 04/01/12 0021  NA 138 --  K 4.0 --  CL 104 --  CO2 23 --  BUN 12 --  CREATININE 0.91 --  CALCIUM 8.6 --  PROT -- 6.0    BILITOT -- 0.6  ALKPHOS -- 49  ALT -- 12  AST -- 16  GLUCOSE 96 --    Basename 03/31/12 1838  CKTOTAL --  CKMB --  CKMBINDEX --  TROPONINI <0.30   Lipid Panel     Component Value Date/Time   CHOL 186 01/28/2012 0943   TRIG 58.0 01/28/2012 0943   HDL 53.90 01/28/2012 0943   CHOLHDL 3 01/28/2012 0943   VLDL 11.6 01/28/2012 0943   LDLCALC 121* 01/28/2012 0943    Basename 04/01/12 0021  INR 1.17      Radiology: Varney Biles Chest Port 1 View  03/31/2012  *RADIOLOGY REPORT*  Clinical Data: 66 year old male with sudden onset dizziness tachycardia, diaphoresis.  PORTABLE CHEST - 1 VIEW  Comparison: 06/30/2010.  Findings: Portable upright AP view 1902 hours.  Mediastinal contours are within normal limits allowing for portable technique and stable.  Part of the cervical ACDF hardware is visible.  No pneumothorax, pulmonary edema, pleural effusion or confluent pulmonary opacity.  Stable increased interstitial markings.  IMPRESSION: No acute cardiopulmonary abnormality.  Original Report Authenticated By: Harley Hallmark, M.D.   EKG:01-Apr-2012 04:42:20  Sinus bradycardia Otherwise normal ECG 12mm/s 71mm/mV 100Hz  8.0.1 12SL 241 CID: 1 Referred by: Confirmed By: Marca Ancona MD Vent. rate 55 BPM PR interval 180 ms QRS duration 84 ms QT/QTc 428/409 ms P-R-T axes 43 49 64   FOLLOW  UP PLANS AND APPOINTMENTS Allergies  Allergen Reactions  . Polymyxin B-Trimethoprim     Eye drops  . Atorvastatin     REACTION: unspecified  . Ciprofloxacin     REACTION: unspecified  . Codeine Phosphate     REACTION: unspecified  . Simvastatin     REACTION: unspecified   Medication List  As of 04/01/2012 11:51 AM   TAKE these medications         Acidophilus 10 MG Caps   Take by mouth daily.      amiodarone 200 MG tablet   Commonly known as: PACERONE   Take 1 tablet (200 mg total) by mouth 2 (two) times daily.      aspirin EC 81 MG tablet   Take 81 mg by mouth daily.      Clobetasol Propionate 0.05 %  external spray   Commonly known as: TEMOVATE   Apply topically 2 (two) times daily.      ezetimibe 10 MG tablet   Commonly known as: ZETIA   Take 1 tablet (10 mg total) by mouth daily.      ICaps MV Tabs   Take 1 tablet by mouth 2 (two) times daily.      METAMUCIL 0.52 G capsule   Generic drug: psyllium   Take 0.52 g by mouth 2 (two) times daily.      mometasone-formoterol 100-5 MCG/ACT Aero   Commonly known as: DULERA   Inhale 2 puffs into the lungs 2 (two) times daily as needed. Patient used 1 puff of this medication.      multivitamin Tabs tablet   Take 1 tablet by mouth daily.      omeprazole 20 MG capsule   Commonly known as: PRILOSEC   Take 20 mg by mouth 2 (two) times daily.            Discharge Orders    Future Appointments: Provider: Department: Dept Phone: Center:   04/12/2012 9:30 AM [facility]  None   05/13/2012 10:30 AM Stacie Glaze, MD [facility] [phone] [facility]   05/24/2012 10:15 AM Wendall Stade, MD [facility] [phone] [facility]     Future Orders Please Complete By Expires   Diet - low sodium heart healthy      Increase activity slowly        Follow-up Information    Follow up with [facility] (July 9th at 9:30 am)    Contact information:   [address] [place] [address]       Follow up with Charlton Haws, MD. (August 20th at 10:15 am)    Contact information:   [address] [place] [address] [phone]          BRING ALL MEDICATIONS WITH YOU TO FOLLOW UP APPOINTMENTS  Time spent with patient to include physician time: 36 min Signed: Theodore Demark 04/01/2012, 11:51 AM Co-Sign MD

## 2012-04-06 ENCOUNTER — Telehealth: Payer: Self-pay | Admitting: Cardiovascular Disease

## 2012-04-06 NOTE — Telephone Encounter (Signed)
Pt having side effects from amiodarone , constipated, headaches , and dizziness,  uses [facility] [phone] , pls call [phone]

## 2012-04-06 NOTE — Telephone Encounter (Signed)
SPOKE WITH PT AND WIFE  IN GREAT LENGTH PT C/O DIZZINESS,HEART POUNDING,NO ENERGY, HEADACHE,AND NAUSEATED  SINCE STARTING AMIODARONE ON Friday 04-01-12   HEART RATE RUNNING  70-90  NO WAY TO CHECK B/P  APPT MADE  WITH LORI GERHARDT NP  FOR Friday 04-08-12 AT 3:00 PM IN MEANTIME  INSTRUCTED  PT TO TAKE TYL  AS DIRECTED  FOR HEADACHE AND  COULD TRY STOOL SOFTNER AS DIRECTED FOR CONSTIPATION.

## 2012-04-08 ENCOUNTER — Encounter: Payer: Self-pay | Admitting: Nurse Practitioner

## 2012-04-08 ENCOUNTER — Encounter: Payer: Self-pay | Admitting: Cardiovascular Disease

## 2012-04-08 ENCOUNTER — Ambulatory Visit (INDEPENDENT_AMBULATORY_CARE_PROVIDER_SITE_OTHER): Payer: Medicare Other | Admitting: Nurse Practitioner

## 2012-04-08 VITALS — BP 119/65 | HR 64 | Ht 65.0 in | Wt 149.0 lb

## 2012-04-08 DIAGNOSIS — I4892 Unspecified atrial flutter: Secondary | ICD-10-CM | POA: Diagnosis not present

## 2012-04-08 DIAGNOSIS — I2510 Atherosclerotic heart disease of native coronary artery without angina pectoris: Secondary | ICD-10-CM | POA: Diagnosis not present

## 2012-04-08 DIAGNOSIS — R5383 Other fatigue: Secondary | ICD-10-CM

## 2012-04-08 DIAGNOSIS — R5381 Other malaise: Secondary | ICD-10-CM

## 2012-04-08 LAB — CBC WITH DIFFERENTIAL/PLATELET
Basophils Absolute: 0 10*3/uL (ref 0.0–0.1)
Basophils Relative: 0 % (ref 0–1)
Eosinophils Absolute: 0.1 10*3/uL (ref 0.0–0.7)
Eosinophils Relative: 1 % (ref 0–5)
HCT: 38.5 % — ABNORMAL LOW (ref 39.0–52.0)
Hemoglobin: 13.7 g/dL (ref 13.0–17.0)
Lymphocytes Relative: 30 % (ref 12–46)
Lymphs Abs: 2.4 10*3/uL (ref 0.7–4.0)
MCH: 31.8 pg (ref 26.0–34.0)
MCHC: 35.6 g/dL (ref 30.0–36.0)
MCV: 89.3 fL (ref 78.0–100.0)
Monocytes Absolute: 0.6 10*3/uL (ref 0.1–1.0)
Monocytes Relative: 8 % (ref 3–12)
Neutro Abs: 5 10*3/uL (ref 1.7–7.7)
Neutrophils Relative %: 62 % (ref 43–77)
Platelets: 171 10*3/uL (ref 150–400)
RBC: 4.31 MIL/uL (ref 4.22–5.81)
RDW: 13.1 % (ref 11.5–15.5)
WBC: 8 10*3/uL (ref 4.0–10.5)

## 2012-04-08 LAB — BASIC METABOLIC PANEL
BUN: 12 mg/dL (ref 6–23)
CO2: 28 mEq/L (ref 19–32)
Calcium: 8.4 mg/dL (ref 8.4–10.5)
Chloride: 103 mEq/L (ref 96–112)
Creat: 0.95 mg/dL (ref 0.50–1.35)
Glucose, Bld: 80 mg/dL (ref 70–99)
Potassium: 4.1 mEq/L (ref 3.5–5.3)
Sodium: 138 mEq/L (ref 135–145)

## 2012-04-08 MED ORDER — AMIODARONE HCL 200 MG PO TABS: 200.0000 mg | ORAL_TABLET | Freq: Every day | ORAL | Status: DC

## 2012-04-08 NOTE — Assessment & Plan Note (Signed)
Patient presents with a multitude of symptoms. On amiodarone for one week. I have cut the dose back to just 200 mg per day. We will place a 24 hour Holter and recheck labs today. He is getting his echo on Tuesday. Will try to have him see Dr. Eden Emms prior to his trip to [place] next Saturday. I advised him to continue to avoid alcohol as this could be a trigger. If he has recurrent atrial flutter, maybe an EP referral would be appropriate. Further disposition to follow. Patient is agreeable to this plan and will call if any problems develop in the interim.

## 2012-04-08 NOTE — Assessment & Plan Note (Addendum)
He currently has no chest pain and continues with medical management. He is apparently intolerant to Zocor and Lipitor. May need to consider other statin therapy in the future.

## 2012-04-08 NOTE — Patient Instructions (Addendum)
Cut the amiodarone back to just one a day  We are going to check labs today  We are going to put a monitor on you today to watch your heart rhythm for 24 hours  We will get your echo as scheduled on Tuesday  See Dr. Eden Emms next week.  Call the [facility] office at [phone] if you have any questions, problems or concerns.

## 2012-04-08 NOTE — Progress Notes (Signed)
Donneta Romberg Date of Birth: [birth year] Medical Record #[id number]  History of Present Illness: Mr. Seda is seen today for a work in visit. He is seen for Dr. Eden Emms. He has known moderate CAD, HLD and past history of rheumatic fever. He presented one week ago with palpitations and was found to be in atrial flutter. This occurred after being out in the heat and having 3 beers. He converted spontaneously. His CHADS is just 1 for HTN. He is on aspirin therapy. He was discharged on amiodarone with plans for an outpatient echo.  He comes in today. He is here with his wife. He feels bad. He is still having palpitations. Says his heart is beating too hard. He has no energy. He has a little tremor. He is nauseous. His rosacea has worsened. He thinks he is worse now that he was prior to his admission. 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 He says he only drank about 2 beers per day but his wife says it has been more. They are leaving for [place] next Saturday. He is quite worried and concerned.   Current Outpatient Prescriptions on File Prior to Visit  Medication Sig Dispense Refill  . aspirin EC 81 MG EC tablet Take 81 mg by mouth daily.        . Clobetasol Propionate (TEMOVATE) 0.05 % external spray Apply topically 2 (two) times daily.  59 mL  11  . ezetimibe (ZETIA) 10 MG tablet Take 1 tablet (10 mg total) by mouth daily.  30 tablet  11  . Lactobacillus (ACIDOPHILUS) 10 MG CAPS Take by mouth daily.        . mometasone-formoterol (DULERA) 100-5 MCG/ACT AERO Inhale 2 puffs into the lungs 2 (two) times daily as needed. Patient used 1 puff of this medication.      . Multiple Vitamins-Minerals (ICAPS MV) TABS Take 1 tablet by mouth 2 (two) times daily.       . multivitamin (RENA-VIT) TABS tablet Take 1 tablet by mouth daily.    0  . omeprazole (PRILOSEC) 20 MG capsule Take 20 mg by mouth 2 (two) times daily.       . psyllium (METAMUCIL) 0.52 G capsule Take 0.52 g by  mouth 2 (two) times daily.        Marland Kitchen DISCONTD: amiodarone (PACERONE) 200 MG tablet Take 1 tablet (200 mg total) by mouth 2 (two) times daily.  60 tablet  1    Allergies  Allergen Reactions  . Polymyxin B-Trimethoprim     Eye drops  . Atorvastatin     REACTION: unspecified  . Ciprofloxacin     REACTION: unspecified  . Codeine Phosphate     REACTION: unspecified  . Simvastatin     REACTION: unspecified    Past Medical History  Diagnosis Date  . H/O: rheumatic fever   . Kidney stones   . GERD (gastroesophageal reflux disease)   . Heart murmur   . Anemia   . Hypertension   . Macular degeneration   . Hyperlipidemia   . History of colonoscopy   . Heart murmur   . COPD (chronic obstructive pulmonary disease)   . Atrial flutter 03/31/2012    converted spontaneously to sinus  . High risk medication use     on amiodarone since 03/31/2012  . CAD (coronary artery disease)     reportedly moderate CAD; managed medically    Past Surgical History  Procedure Date  . Cervical discectomy   .  Cholecystectomy   . Mouth surgery   . Knee cartiledge   . Foot surgery   . Appendectomy   . Cardiac catheterization 04/22/2011    moderate left main and RCA stenosis not significant by FFR and IVUS on medical therapy    History  Smoking status  . Current Everyday Smoker -- 0.5 packs/day  Smokeless tobacco  . Former User    History  Alcohol Use  . Yes    Family History  Problem Relation Age of Onset  . Liver disease    . Prostate cancer    . Coronary artery disease    . Heart disease Mother   . Diabetes Mother   . Cirrhosis Mother   . Heart disease Father   . Cancer Father     prostate  . Heart disease Sister   . Arthritis Sister   . Heart disease Brother     Review of Systems: The review of systems is per the HPI.  All other systems were reviewed and are negative.  Physical Exam: BP 119/65  Pulse 64  Ht 5\' 5"  (1.651 m)  Wt 149 lb (67.586 kg)  BMI 24.79  kg/m2 Patient is pleasant and in no acute distress.He is a little anxious.  Skin is warm and dry. Color is normal.  HEENT is unremarkable except he does have rosacea. Normocephalic/atraumatic. PERRL. Sclera are nonicteric. Neck is supple. No masses. No JVD. Lungs are clear. Cardiac exam shows a regular rate and rhythm. Abdomen is soft. Extremities are without edema. Gait and ROM are intact. No gross neurologic deficits noted but he does have a fine tremor of his hands noted.   LABORATORY DATA: EKG today shows sinus bradycardia with a rate of 58. QT is 430.   Lab Results  Component Value Date   WBC 8.6 04/01/2012   HGB 13.3 04/01/2012   HCT 36.8* 04/01/2012   PLT 150 04/01/2012   GLUCOSE 96 04/01/2012   CHOL 186 01/28/2012   TRIG 58.0 01/28/2012   HDL 53.90 01/28/2012   LDLDIRECT 168.5 03/15/2009   LDLCALC 121* 01/28/2012   ALT 12 04/01/2012   AST 16 04/01/2012   NA 138 04/01/2012   K 4.0 04/01/2012   CL 104 04/01/2012   CREATININE 0.91 04/01/2012   BUN 12 04/01/2012   CO2 23 04/01/2012   TSH 1.304 04/01/2012   PSA 0.73 03/15/2009   INR 1.17 04/01/2012   Dg Chest Port 1 View  03/31/2012  *RADIOLOGY REPORT*  Clinical Data: 66 year old male with sudden onset dizziness tachycardia, diaphoresis.  PORTABLE CHEST - 1 VIEW  Comparison: 06/30/2010.  Findings: Portable upright AP view 1902 hours.  Mediastinal contours are within normal limits allowing for portable technique and stable.  Part of the cervical ACDF hardware is visible.  No pneumothorax, pulmonary edema, pleural effusion or confluent pulmonary opacity.  Stable increased interstitial markings.  IMPRESSION: No acute cardiopulmonary abnormality.  Original Report Authenticated By: Harley Hallmark, M.D.   Assessment / Plan:

## 2012-04-09 LAB — TSH: TSH: 3.49 u[IU]/mL (ref 0.350–4.500)

## 2012-04-11 ENCOUNTER — Other Ambulatory Visit (HOSPITAL_COMMUNITY): Payer: Self-pay | Admitting: Cardiovascular Disease

## 2012-04-11 DIAGNOSIS — IMO0002 Reserved for concepts with insufficient information to code with codable children: Secondary | ICD-10-CM

## 2012-04-12 ENCOUNTER — Ambulatory Visit (HOSPITAL_COMMUNITY): Payer: Medicare Other | Attending: Cardiology | Admitting: Radiology

## 2012-04-12 DIAGNOSIS — I4892 Unspecified atrial flutter: Secondary | ICD-10-CM | POA: Diagnosis not present

## 2012-04-12 DIAGNOSIS — R000 Tachycardia, unspecified: Secondary | ICD-10-CM | POA: Insufficient documentation

## 2012-04-12 DIAGNOSIS — I059 Rheumatic mitral valve disease, unspecified: Secondary | ICD-10-CM | POA: Diagnosis not present

## 2012-04-12 DIAGNOSIS — I10 Essential (primary) hypertension: Secondary | ICD-10-CM | POA: Diagnosis not present

## 2012-04-12 DIAGNOSIS — I2510 Atherosclerotic heart disease of native coronary artery without angina pectoris: Secondary | ICD-10-CM | POA: Insufficient documentation

## 2012-04-12 DIAGNOSIS — J449 Chronic obstructive pulmonary disease, unspecified: Secondary | ICD-10-CM | POA: Diagnosis not present

## 2012-04-12 DIAGNOSIS — I4891 Unspecified atrial fibrillation: Secondary | ICD-10-CM | POA: Insufficient documentation

## 2012-04-12 DIAGNOSIS — R002 Palpitations: Secondary | ICD-10-CM | POA: Diagnosis not present

## 2012-04-12 DIAGNOSIS — E119 Type 2 diabetes mellitus without complications: Secondary | ICD-10-CM | POA: Insufficient documentation

## 2012-04-12 DIAGNOSIS — R011 Cardiac murmur, unspecified: Secondary | ICD-10-CM | POA: Diagnosis not present

## 2012-04-12 DIAGNOSIS — IMO0002 Reserved for concepts with insufficient information to code with codable children: Secondary | ICD-10-CM

## 2012-04-12 DIAGNOSIS — J4489 Other specified chronic obstructive pulmonary disease: Secondary | ICD-10-CM | POA: Insufficient documentation

## 2012-04-12 DIAGNOSIS — I00 Rheumatic fever without heart involvement: Secondary | ICD-10-CM | POA: Insufficient documentation

## 2012-04-12 DIAGNOSIS — I517 Cardiomegaly: Secondary | ICD-10-CM | POA: Insufficient documentation

## 2012-04-12 NOTE — Progress Notes (Signed)
Echocardiogram performed.  

## 2012-04-13 ENCOUNTER — Telehealth: Payer: Self-pay | Admitting: *Deleted

## 2012-04-13 NOTE — Telephone Encounter (Signed)
PER STAFF MESSAGE FROM LORI PT NEEDS TO FOLLOW UP WITH DR Eden Emms THIS WEEK PRIOR TO LEAVING FOR [place] LMT[place] ./CY

## 2012-04-15 ENCOUNTER — Ambulatory Visit (INDEPENDENT_AMBULATORY_CARE_PROVIDER_SITE_OTHER): Payer: Medicare Other | Admitting: Cardiovascular Disease

## 2012-04-15 ENCOUNTER — Ambulatory Visit: Payer: PRIVATE HEALTH INSURANCE | Admitting: Nurse Practitioner

## 2012-04-15 ENCOUNTER — Encounter: Payer: Self-pay | Admitting: Cardiovascular Disease

## 2012-04-15 VITALS — BP 128/74 | HR 60 | Ht 65.0 in | Wt 150.0 lb

## 2012-04-15 DIAGNOSIS — I2510 Atherosclerotic heart disease of native coronary artery without angina pectoris: Secondary | ICD-10-CM | POA: Diagnosis not present

## 2012-04-15 DIAGNOSIS — E785 Hyperlipidemia, unspecified: Secondary | ICD-10-CM | POA: Diagnosis not present

## 2012-04-15 DIAGNOSIS — F1011 Alcohol abuse, in remission: Secondary | ICD-10-CM | POA: Insufficient documentation

## 2012-04-15 DIAGNOSIS — I10 Essential (primary) hypertension: Secondary | ICD-10-CM | POA: Diagnosis not present

## 2012-04-15 DIAGNOSIS — I4892 Unspecified atrial flutter: Secondary | ICD-10-CM

## 2012-04-15 DIAGNOSIS — F1010 Alcohol abuse, uncomplicated: Secondary | ICD-10-CM

## 2012-04-15 MED ORDER — PROPRANOLOL HCL 10 MG PO TABS: ORAL_TABLET | ORAL | Status: DC

## 2012-04-15 NOTE — Assessment & Plan Note (Signed)
Maint NSR Palpitations ? Secondary to just PVC/PaC  Continue low dose amiodarone. Labs ok including TSH.  PRN inderal F/U with me after trip and with EPS when available

## 2012-04-15 NOTE — Patient Instructions (Addendum)
Your physician recommends that you schedule a follow-up appointment in: 3-4  WEEKS WITH DR [facility] Your physician recommends that you continue on your current medications as directed. Please refer to the Current Medication list given to you today. You have been referred to EP  FIRST  AVAILABLE   DX A FLUTTER Your physician has recommended you make the following change in your medication: PROPRANOLOL 10 MG 1  A DAY  AS NEEDED FOR PALPITATION

## 2012-04-15 NOTE — Assessment & Plan Note (Signed)
Well controlled.  Continue current medications and low sodium Dash type diet.    

## 2012-04-15 NOTE — Assessment & Plan Note (Signed)
Seems less jittery than described by PA.  Counseled on abstinence form ETOH regarding arrhythmia

## 2012-04-15 NOTE — Assessment & Plan Note (Signed)
Cholesterol is at goal.  Continue current dose of statin and diet Rx.  No myalgias or side effects.  F/U  LFT's in 6 months. Lab Results  Component Value Date   LDLCALC 121* 01/28/2012

## 2012-04-15 NOTE — Progress Notes (Signed)
Patient ID: Ryan [name], male   DOB: September 07, 1946, 66 y.o.   MRN: [id number] Ryan [name] is seen today for f/u of palpitations  He has known moderate CAD last cath 7/12  , HLD and past history of rheumatic fever. He presented 04/01/11  with palpitations and was found to be in atrial flutter. This occurred after being out in the heat and having 3 beers. He converted spontaneously. His CHADS is just 1 for HTN. He is on aspirin therapy. He was discharged on amiodarone with plans for an outpatient echo. Amiodarone decreased by PA last week.  He may have gone through some withdrawal from ETOH.  I reviewed his event monitor and he is not having recurrent flutter.  Just PAC;s and PVC;s F/U echo also reviewed and EF normal with only mild LAE.  Has COPD but does not use dilura much.  Going to [place] next week.    ROS: Denies fever, malais, weight loss, blurry vision, decreased visual acuity, cough, sputum, SOB, hemoptysis, pleuritic pain, palpitaitons, heartburn, abdominal pain, melena, lower extremity edema, claudication, or rash.  All other systems reviewed and negative  General: Affect appropriate Healthy:  appears stated age HEENT: normal Neck supple with no adenopathy JVP normal no bruits no thyromegaly Lungs clear with no wheezing and good diaphragmatic motion Heart:  S1/S2 no murmur, no rub, gallop or click PMI normal Abdomen: benighn, BS positve, no tenderness, no AAA no bruit.  No HSM or HJR Distal pulses intact with no bruits No edema Neuro non-focal Skin warm and dry No muscular weakness   Current Outpatient Prescriptions  Medication Sig Dispense Refill  . amiodarone (PACERONE) 200 MG tablet Take 1 tablet (200 mg total) by mouth daily.  60 tablet  1  . aspirin EC 81 MG EC tablet Take 81 mg by mouth daily.        . Clobetasol Propionate (TEMOVATE) 0.05 % external spray Apply topically 2 (two) times daily.  59 mL  11  . ezetimibe (ZETIA) 10 MG tablet Take 1 tablet (10 mg total) by mouth  daily.  30 tablet  11  . Lactobacillus (ACIDOPHILUS) 10 MG CAPS Take by mouth daily.        . mometasone-formoterol (DULERA) 100-5 MCG/ACT AERO Inhale 2 puffs into the lungs 2 (two) times daily as needed. Patient used 1 puff of this medication.      . Multiple Vitamins-Minerals (ICAPS MV) TABS Take 1 tablet by mouth 2 (two) times daily.       . multivitamin (RENA-VIT) TABS tablet Take 1 tablet by mouth daily.    0  . omeprazole (PRILOSEC) 20 MG capsule Take 20 mg by mouth 2 (two) times daily.       . psyllium (METAMUCIL) 0.52 G capsule Take 0.52 g by mouth 2 (two) times daily.        Marland Kitchen VIGAMOX 0.5 % ophthalmic solution Place 1 drop into both eyes as directed.         Allergies  Polymyxin b-trimethoprim; Atorvastatin; Ciprofloxacin; Codeine phosphate; and Simvastatin  Electrocardiogram: 04/08/12  SR rate 58 normal ECG  Assessment and Plan

## 2012-04-15 NOTE — Assessment & Plan Note (Signed)
Stable with no angina and good activity level.  Continue medical Rx  Last cath 7/12 medical Rx

## 2012-05-06 ENCOUNTER — Emergency Department (HOSPITAL_BASED_OUTPATIENT_CLINIC_OR_DEPARTMENT_OTHER)
Admission: EM | Admit: 2012-05-06 | Discharge: 2012-05-06 | Disposition: A | Discharge status: Home / Self Care | Payer: Medicare Other | Attending: Emergency Medicine | Admitting: Emergency Medicine

## 2012-05-06 ENCOUNTER — Encounter (HOSPITAL_BASED_OUTPATIENT_CLINIC_OR_DEPARTMENT_OTHER): Payer: Self-pay | Admitting: *Deleted

## 2012-05-06 DIAGNOSIS — J449 Chronic obstructive pulmonary disease, unspecified: Secondary | ICD-10-CM | POA: Insufficient documentation

## 2012-05-06 DIAGNOSIS — K219 Gastro-esophageal reflux disease without esophagitis: Secondary | ICD-10-CM | POA: Insufficient documentation

## 2012-05-06 DIAGNOSIS — W268XXA Contact with other sharp object(s), not elsewhere classified, initial encounter: Secondary | ICD-10-CM | POA: Insufficient documentation

## 2012-05-06 DIAGNOSIS — I2510 Atherosclerotic heart disease of native coronary artery without angina pectoris: Secondary | ICD-10-CM | POA: Insufficient documentation

## 2012-05-06 DIAGNOSIS — E785 Hyperlipidemia, unspecified: Secondary | ICD-10-CM | POA: Insufficient documentation

## 2012-05-06 DIAGNOSIS — Y998 Other external cause status: Secondary | ICD-10-CM | POA: Insufficient documentation

## 2012-05-06 DIAGNOSIS — S61209A Unspecified open wound of unspecified finger without damage to nail, initial encounter: Secondary | ICD-10-CM | POA: Insufficient documentation

## 2012-05-06 DIAGNOSIS — Y9389 Activity, other specified: Secondary | ICD-10-CM | POA: Insufficient documentation

## 2012-05-06 DIAGNOSIS — I10 Essential (primary) hypertension: Secondary | ICD-10-CM | POA: Diagnosis not present

## 2012-05-06 DIAGNOSIS — S61012A Laceration without foreign body of left thumb without damage to nail, initial encounter: Secondary | ICD-10-CM

## 2012-05-06 DIAGNOSIS — F17200 Nicotine dependence, unspecified, uncomplicated: Secondary | ICD-10-CM | POA: Diagnosis not present

## 2012-05-06 DIAGNOSIS — I4892 Unspecified atrial flutter: Secondary | ICD-10-CM | POA: Diagnosis not present

## 2012-05-06 DIAGNOSIS — D649 Anemia, unspecified: Secondary | ICD-10-CM | POA: Diagnosis not present

## 2012-05-06 DIAGNOSIS — J4489 Other specified chronic obstructive pulmonary disease: Secondary | ICD-10-CM | POA: Insufficient documentation

## 2012-05-06 DIAGNOSIS — Z79899 Other long term (current) drug therapy: Secondary | ICD-10-CM | POA: Insufficient documentation

## 2012-05-06 MED ORDER — LIDOCAINE HCL 2 % IJ SOLN
INTRAMUSCULAR | Status: AC
  Administered 2012-05-06: 10:00:00
  Filled 2012-05-06: qty 1

## 2012-05-06 MED ORDER — TETANUS-DIPHTH-ACELL PERTUSSIS 5-2.5-18.5 LF-MCG/0.5 IM SUSP
INTRAMUSCULAR | Status: AC
  Administered 2012-05-06: 0.5 mL via INTRAMUSCULAR
  Filled 2012-05-06: qty 0.5

## 2012-05-06 NOTE — ED Provider Notes (Signed)
History     CSN: [id number]  Arrival date & time 05/06/12  0915   First MD Initiated Contact with Patient 05/06/12 [id number]      Chief Complaint  Patient presents with  . Extremity Laceration    (Consider location/radiation/quality/duration/timing/severity/associated sxs/prior treatment) HPI Comments: Laceration to the left hand when crossbow released.  Last tdap unknown.  No other complaints or injuries.    Patient is a 66 y.o. male presenting with skin laceration. The history is provided by the patient.  Laceration  The incident occurred less than 1 hour ago. The laceration is located on the left hand. Size: 3.5 cm. The pain is moderate. The pain has been constant since onset. He reports no foreign bodies present. His tetanus status is unknown.    Past Medical History  Diagnosis Date  . H/O: rheumatic fever   . Kidney stones   . GERD (gastroesophageal reflux disease)   . Heart murmur   . Anemia   . Hypertension   . Macular degeneration   . Hyperlipidemia   . History of colonoscopy   . Heart murmur   . COPD (chronic obstructive pulmonary disease)   . Atrial flutter 03/31/2012    converted spontaneously to sinus  . High risk medication use     on amiodarone since 03/31/2012  . CAD (coronary artery disease)     reportedly moderate CAD; managed medically    Past Surgical History  Procedure Date  . Cervical discectomy   . Cholecystectomy   . Mouth surgery   . Knee cartiledge   . Foot surgery   . Appendectomy   . Cardiac catheterization 04/22/2011    moderate left main and RCA stenosis not significant by FFR and IVUS on medical therapy    Family History  Problem Relation Age of Onset  . Liver disease    . Prostate cancer    . Coronary artery disease    . Heart disease Mother   . Diabetes Mother   . Cirrhosis Mother   . Heart disease Father   . Cancer Father     prostate  . Heart disease Sister   . Arthritis Sister   . Heart disease Brother     History    Substance Use Topics  . Smoking status: Current Everyday Smoker -- 0.5 packs/day    Types: Cigarettes  . Smokeless tobacco: Former Neurosurgeon  . Alcohol Use: No      Review of Systems  All other systems reviewed and are negative.    Allergies  Polymyxin b-trimethoprim; Atorvastatin; Ciprofloxacin; Codeine phosphate; and Simvastatin  Home Medications   Current Outpatient Rx  Name Route Sig Dispense Refill  . AMIODARONE HCL 200 MG PO TABS Oral Take 1 tablet (200 mg total) by mouth daily. 60 tablet 1  . ASPIRIN EC 81 MG PO TBEC Oral Take 81 mg by mouth daily.      Marland Kitchen CLOBETASOL PROPIONATE 0.05 % EX LIQD Topical Apply topically 2 (two) times daily. 59 mL 11  . EZETIMIBE 10 MG PO TABS Oral Take 1 tablet (10 mg total) by mouth daily. 30 tablet 11  . ACIDOPHILUS 10 MG PO CAPS Oral Take by mouth daily.      . MOMETASONE FURO-FORMOTEROL FUM 100-5 MCG/ACT IN AERO Inhalation Inhale 2 puffs into the lungs 2 (two) times daily as needed. Patient used 1 puff of this medication.    . ICAPS MV PO TABS Oral Take 1 tablet by mouth 2 (two) times daily.     Marland Kitchen  RENA-VITE PO TABS Oral Take 1 tablet by mouth daily.  0  . OMEPRAZOLE 20 MG PO CPDR Oral Take 20 mg by mouth 2 (two) times daily.     Marland Kitchen PROPRANOLOL HCL 10 MG PO TABS  AS NEEDED FOR PALPITATIONS 30 tablet 1  . PSYLLIUM 0.52 G PO CAPS Oral Take 0.52 g by mouth 2 (two) times daily.      Marland Kitchen VIGAMOX 0.5 % OP SOLN Both Eyes Place 1 drop into both eyes as directed.       BP 159/64  Pulse 64  Temp 97.7 F (36.5 C) (Oral)  Resp 20  SpO2 100%  Physical Exam  Nursing note and vitals reviewed. Constitutional: He is oriented to person, place, and time. He appears well-developed and well-nourished. No distress.  HENT:  Head: Normocephalic and atraumatic.  Neck: Normal range of motion. Neck supple.  Musculoskeletal:       On the left hand, there is a laceration running longitudinally over the proximal phalanx of the thumb.  There is no tendon involvement  to visual inspection through a bloodless field.  Also full range of motion with strong flexion and extension.  The mcp joint of the thumb is fully stable.    Neurological: He is alert and oriented to person, place, and time.  Skin: Skin is warm and dry. He is not diaphoretic.    ED Course  Procedures (including critical care time)  Labs Reviewed - No data to display No results found.   No diagnosis found.  LACERATION REPAIR Performed by: Geoffery Lyons Authorized by: Geoffery Lyons Consent: Verbal consent obtained. Risks and benefits: risks, benefits and alternatives were discussed Consent given by: patient Patient identity confirmed: provided demographic data Prepped and Draped in normal sterile fashion Wound explored  Laceration Location: left thumb  Laceration Length: 3.5cm  No Foreign Bodies seen or palpated  Anesthesia: local infiltration  Local anesthetic: lidocaine 2% without epinephrine  Anesthetic total: 2 ml  Irrigation method: syringe Amount of cleaning: standard  Skin closure: 4-0 prolene  Number of sutures: 8  Technique: simple interrupted  Patient tolerance: Patient tolerated the procedure well with no immediate complications.   MDM  Will discharge with local wound care, removal in 7 days.  Return prn for signs or symptoms of infection.          Geoffery Lyons, MD 05/06/12 [id number]

## 2012-05-06 NOTE — ED Notes (Signed)
Wound placed in sterile water and betadine.

## 2012-05-06 NOTE — ED Notes (Signed)
MD at bedside. 

## 2012-05-06 NOTE — ED Notes (Signed)
Family at bedside. 

## 2012-05-09 ENCOUNTER — Encounter: Payer: Medicare Other | Admitting: Cardiovascular Disease

## 2012-05-12 ENCOUNTER — Encounter: Payer: Self-pay | Admitting: Cardiovascular Disease

## 2012-05-12 ENCOUNTER — Ambulatory Visit (INDEPENDENT_AMBULATORY_CARE_PROVIDER_SITE_OTHER): Payer: Medicare Other | Admitting: Cardiovascular Disease

## 2012-05-12 VITALS — BP 147/78 | HR 62 | Ht 65.0 in | Wt 148.0 lb

## 2012-05-12 DIAGNOSIS — F1010 Alcohol abuse, uncomplicated: Secondary | ICD-10-CM

## 2012-05-12 DIAGNOSIS — I4892 Unspecified atrial flutter: Secondary | ICD-10-CM

## 2012-05-12 DIAGNOSIS — Z79899 Other long term (current) drug therapy: Secondary | ICD-10-CM

## 2012-05-12 DIAGNOSIS — F17200 Nicotine dependence, unspecified, uncomplicated: Secondary | ICD-10-CM

## 2012-05-12 NOTE — Assessment & Plan Note (Signed)
Cholesterol is at goal.  Continue current dose of statin and diet Rx.  No myalgias or side effects.  F/U  LFT's in 6 months. Lab Results  Component Value Date   LDLCALC 121* 01/28/2012             

## 2012-05-12 NOTE — Progress Notes (Signed)
Patient ID: Ryan [name], male   DOB: [birth year], 66 y.o.   MRN: [id number] Mr. Keimig is seen today for f/u of palpitations He has known moderate CAD last cath 7/12 , HLD and past history of rheumatic fever. He presented 04/01/11 with palpitations and was found to be in atrial flutter. This occurred after being out in the heat and having 3 beers. He converted spontaneously. His CHADS is just 1 for HTN. He is on aspirin therapy. He was discharged on amiodarone with plans for an outpatient echo. Amiodarone decreased to 200 mg daily . He may have gone through some withdrawal from ETOH. I reviewed his event monitor and he is not having recurrent flutter. Just PAC;s and PVC;s F/U echo also reviewed and EF normal with only mild LAE. Has COPD but does not use dilura much.  Event monitor 04/08/12  PAC PVC no afib  He has tried to abstain from ETOH but had a little in [place] on a recent trip.  Still smoking but less than a ppd.  His current palpitations appear to be more PAC/PVC but he has an appt with Dr Ladona Ridgel to discuss possible flutter ablation.  Relation fo ETOH and nicotine to palpitations discussed  Will have TSH/T4 and LFT;s in 6 weeks   ROS: Denies fever, malais, weight loss, blurry vision, decreased visual acuity, cough, sputum, SOB, hemoptysis, pleuritic pain, palpitaitons, heartburn, abdominal pain, melena, lower extremity edema, claudication, or rash.  All other systems reviewed and negative  General: Affect appropriate Healthy:  appears stated age HEENT: normal Neck supple with no adenopathy JVP normal no bruits no thyromegaly Lungs clear with no wheezing and good diaphragmatic motion Heart:  S1/S2 no murmur, no rub, gallop or click PMI normal Abdomen: benighn, BS positve, no tenderness, no AAA no bruit.  No HSM or HJR Distal pulses intact with no bruits No edema Neuro non-focal Skin warm and dry eczema on cheeks No muscular weakness   Current Outpatient Prescriptions    Medication Sig Dispense Refill  . amiodarone (PACERONE) 200 MG tablet Take 1 tablet (200 mg total) by mouth daily.  60 tablet  1  . aspirin EC 81 MG EC tablet Take 81 mg by mouth daily.        Marland Kitchen ezetimibe (ZETIA) 10 MG tablet Take 1 tablet (10 mg total) by mouth daily.  30 tablet  11  . Lactobacillus (ACIDOPHILUS) 10 MG CAPS Take by mouth daily.        . Multiple Vitamins-Minerals (ICAPS MV) TABS Take 1 tablet by mouth 2 (two) times daily.       . multivitamin (RENA-VIT) TABS tablet Take 1 tablet by mouth daily.    0  . omeprazole (PRILOSEC) 20 MG capsule Take 20 mg by mouth 2 (two) times daily.       . propranolol (INDERAL) 10 MG tablet AS NEEDED FOR PALPITATIONS  30 tablet  1  . psyllium (METAMUCIL) 0.52 G capsule Take 0.52 g by mouth 2 (two) times daily.        Marland Kitchen VIGAMOX 0.5 % ophthalmic solution Place 1 drop into both eyes as directed.         Allergies  Polymyxin b-trimethoprim; Atorvastatin; Ciprofloxacin; Codeine phosphate; and Simvastatin  Electrocardiogram:  Assessment and Plan

## 2012-05-12 NOTE — Assessment & Plan Note (Signed)
Big issue for patient along with adjustment disorder and anxiety  Wife monitory and he seems to be doing better

## 2012-05-12 NOTE — Patient Instructions (Addendum)
Your physician wants you to follow-up in: 6 MONTHS  You will receive a reminder letter in the mail two months in advance. If you don't receive a letter, please call our office to schedule the follow-up appointment.   Your physician recommends that you continue on your current medications as directed. Please refer to the Current Medication list given to you today.  Your physician recommends that you return for lab work in: TSH, LIVER IN 6 WEEKS

## 2012-05-12 NOTE — Assessment & Plan Note (Signed)
Still having palpitations but not clear from event monitor that flutter is recurring.  Low dose amiodarone given moderate LM and PRN inderal.  F/U Dr Ladona Ridgel 8/23  Amiodarone labs in 6 weeks

## 2012-05-12 NOTE — Assessment & Plan Note (Signed)
Did not tolerate Chantix.  Struggling with ETOH now  Consider F/U with primary and welbutrin in 6 months

## 2012-05-13 ENCOUNTER — Encounter: Payer: Self-pay | Admitting: Internal Medicine

## 2012-05-13 ENCOUNTER — Ambulatory Visit (INDEPENDENT_AMBULATORY_CARE_PROVIDER_SITE_OTHER): Payer: Medicare Other | Admitting: Internal Medicine

## 2012-05-13 VITALS — BP 136/80 | HR 72 | Temp 98.2°F | Resp 16 | Ht 65.0 in | Wt 147.0 lb

## 2012-05-13 DIAGNOSIS — T887XXA Unspecified adverse effect of drug or medicament, initial encounter: Secondary | ICD-10-CM

## 2012-05-13 DIAGNOSIS — R7401 Elevation of levels of liver transaminase levels: Secondary | ICD-10-CM | POA: Diagnosis not present

## 2012-05-13 DIAGNOSIS — F17200 Nicotine dependence, unspecified, uncomplicated: Secondary | ICD-10-CM

## 2012-05-13 DIAGNOSIS — I4892 Unspecified atrial flutter: Secondary | ICD-10-CM

## 2012-05-13 DIAGNOSIS — R7402 Elevation of levels of lactic acid dehydrogenase (LDH): Secondary | ICD-10-CM

## 2012-05-13 DIAGNOSIS — R748 Abnormal levels of other serum enzymes: Secondary | ICD-10-CM

## 2012-05-13 DIAGNOSIS — E0590 Thyrotoxicosis, unspecified without thyrotoxic crisis or storm: Secondary | ICD-10-CM | POA: Diagnosis not present

## 2012-05-13 DIAGNOSIS — N401 Enlarged prostate with lower urinary tract symptoms: Secondary | ICD-10-CM | POA: Diagnosis not present

## 2012-05-13 DIAGNOSIS — R634 Abnormal weight loss: Secondary | ICD-10-CM

## 2012-05-13 DIAGNOSIS — Z79899 Other long term (current) drug therapy: Secondary | ICD-10-CM

## 2012-05-13 DIAGNOSIS — Z0000 Encounter for general adult medical examination without abnormal findings: Secondary | ICD-10-CM

## 2012-05-13 DIAGNOSIS — N138 Other obstructive and reflux uropathy: Secondary | ICD-10-CM

## 2012-05-13 DIAGNOSIS — J449 Chronic obstructive pulmonary disease, unspecified: Secondary | ICD-10-CM

## 2012-05-13 DIAGNOSIS — F1010 Alcohol abuse, uncomplicated: Secondary | ICD-10-CM

## 2012-05-13 LAB — HEPATIC FUNCTION PANEL
ALT: 19 U/L (ref 0–53)
AST: 22 U/L (ref 0–37)
Albumin: 4.1 g/dL (ref 3.5–5.2)
Alkaline Phosphatase: 66 U/L (ref 39–117)
Bilirubin, Direct: 0.2 mg/dL (ref 0.0–0.3)
Total Bilirubin: 0.9 mg/dL (ref 0.3–1.2)
Total Protein: 7.3 g/dL (ref 6.0–8.3)

## 2012-05-13 LAB — CBC WITH DIFFERENTIAL/PLATELET
Basophils Absolute: 0 10*3/uL (ref 0.0–0.1)
Basophils Relative: 0.2 % (ref 0.0–3.0)
Eosinophils Absolute: 0.1 10*3/uL (ref 0.0–0.7)
Eosinophils Relative: 0.8 % (ref 0.0–5.0)
HCT: 44 % (ref 39.0–52.0)
Hemoglobin: 15 g/dL (ref 13.0–17.0)
Lymphocytes Relative: 19.9 % (ref 12.0–46.0)
Lymphs Abs: 2.2 10*3/uL (ref 0.7–4.0)
MCHC: 34 g/dL (ref 30.0–36.0)
MCV: 95.6 fl (ref 78.0–100.0)
Monocytes Absolute: 0.5 10*3/uL (ref 0.1–1.0)
Monocytes Relative: 5 % (ref 3.0–12.0)
Neutro Abs: 8 10*3/uL — ABNORMAL HIGH (ref 1.4–7.7)
Neutrophils Relative %: 74.1 % (ref 43.0–77.0)
Platelets: 204 10*3/uL (ref 150.0–400.0)
RBC: 4.61 Mil/uL (ref 4.22–5.81)
RDW: 13.8 % (ref 11.5–14.6)
WBC: 10.9 10*3/uL — ABNORMAL HIGH (ref 4.5–10.5)

## 2012-05-13 LAB — BASIC METABOLIC PANEL
BUN: 15 mg/dL (ref 6–23)
CO2: 27 mEq/L (ref 19–32)
Calcium: 9.7 mg/dL (ref 8.4–10.5)
Chloride: 97 mEq/L (ref 96–112)
Creatinine, Ser: 0.9 mg/dL (ref 0.4–1.5)
GFR: 86.58 mL/min (ref >60.00)
Glucose, Bld: 82 mg/dL (ref 70–99)
Potassium: 4.9 mEq/L (ref 3.5–5.1)
Sodium: 133 mEq/L — ABNORMAL LOW (ref 135–145)

## 2012-05-13 LAB — LIPID PANEL
Cholesterol: 195 mg/dL (ref 0–200)
HDL: 53 mg/dL (ref >39.00)
LDL Cholesterol: 127 mg/dL — ABNORMAL HIGH (ref 0–99)
Total CHOL/HDL Ratio: 4
Triglycerides: 74 mg/dL (ref 0.0–149.0)
VLDL: 14.8 mg/dL (ref 0.0–40.0)

## 2012-05-13 LAB — TSH: TSH: 2.21 u[IU]/mL (ref 0.35–5.50)

## 2012-05-13 LAB — LDL CHOLESTEROL, DIRECT: Direct LDL: 123.3 mg/dL

## 2012-05-13 LAB — T3, FREE: T3, Free: 3.2 pg/mL (ref 2.3–4.2)

## 2012-05-13 LAB — PSA: PSA: 0.86 ng/mL (ref 0.10–4.00)

## 2012-05-13 LAB — T4, FREE: Free T4: 1.07 ng/dL (ref 0.60–1.60)

## 2012-05-13 MED ORDER — EZETIMIBE 10 MG PO TABS: 10.0000 mg | ORAL_TABLET | Freq: Every day | ORAL | Status: DC

## 2012-05-13 NOTE — Patient Instructions (Signed)
The patient is instructed to continue all medications as prescribed. Schedule followup with check out clerk upon leaving the clinic  

## 2012-05-13 NOTE — Progress Notes (Signed)
Subjective:    Patient ID: Ryan [name], male    DOB: 1945/12/25, 66 y.o.   MRN: [id number]  HPI  Pt was seen for AF at ER and was referred to cardiology for follow up Has been on amiodarone The patient was seen on august 2 for a laceration to the thumb May need suture removal today Has stopped ETOH! Has cut back smoking Was referred by the ER back to have laceration managed  Review of Systems  Constitutional: Negative for fever and fatigue.  HENT: Negative for hearing loss, congestion, neck pain and postnasal drip.   Eyes: Negative for discharge, redness and visual disturbance.  Respiratory: Negative for cough, shortness of breath and wheezing.   Cardiovascular: Negative for leg swelling.  Gastrointestinal: Negative for abdominal pain, constipation and abdominal distention.  Genitourinary: Negative for urgency and frequency.  Musculoskeletal: Negative for joint swelling and arthralgias.  Skin: Negative for color change and rash.  Neurological: Negative for weakness and light-headedness.  Hematological: Negative for adenopathy.  Psychiatric/Behavioral: Negative for behavioral problems.   Past Medical History  Diagnosis Date  . H/O: rheumatic fever   . Kidney stones   . GERD (gastroesophageal reflux disease)   . Heart murmur   . Anemia   . Hypertension   . Macular degeneration   . Hyperlipidemia   . History of colonoscopy   . Heart murmur   . COPD (chronic obstructive pulmonary disease)   . Atrial flutter 03/31/2012    converted spontaneously to sinus  . High risk medication use     on amiodarone since 03/31/2012  . CAD (coronary artery disease)     reportedly moderate CAD; managed medically    History   Social History  . Marital Status: Married    Spouse Name: N/A    Number of Children: N/A  . Years of Education: N/A   Occupational History  . retired    Social History Main Topics  . Smoking status: Current Everyday Smoker -- 0.5 packs/day    Types:  Cigarettes  . Smokeless tobacco: Former Neurosurgeon  . Alcohol Use: No  . Drug Use: No  . Sexually Active: Yes   Other Topics Concern  . Not on file   Social History Narrative  . No narrative on file    Past Surgical History  Procedure Date  . Cervical discectomy   . Cholecystectomy   . Mouth surgery   . Knee cartiledge   . Foot surgery   . Appendectomy   . Cardiac catheterization 04/22/2011    moderate left main and RCA stenosis not significant by FFR and IVUS on medical therapy    Family History  Problem Relation Age of Onset  . Liver disease    . Prostate cancer    . Coronary artery disease    . Heart disease Mother   . Diabetes Mother   . Cirrhosis Mother   . Heart disease Father   . Cancer Father     prostate  . Heart disease Sister   . Arthritis Sister   . Heart disease Brother     Allergies  Allergen Reactions  . Polymyxin B-Trimethoprim     Eye drops  . Atorvastatin     REACTION: unspecified  . Ciprofloxacin     REACTION: unspecified  . Codeine Phosphate     REACTION: unspecified  . Simvastatin     REACTION: unspecified    Current Outpatient Prescriptions on File Prior to Visit  Medication Sig Dispense Refill  .  amiodarone (PACERONE) 200 MG tablet Take 1 tablet (200 mg total) by mouth daily.  60 tablet  1  . aspirin EC 81 MG EC tablet Take 81 mg by mouth daily.        . Lactobacillus (ACIDOPHILUS) 10 MG CAPS Take by mouth daily.        . Multiple Vitamins-Minerals (ICAPS MV) TABS Take 1 tablet by mouth 2 (two) times daily.       . multivitamin (RENA-VIT) TABS tablet Take 1 tablet by mouth daily.    0  . omeprazole (PRILOSEC) 20 MG capsule Take 20 mg by mouth 2 (two) times daily.       . propranolol (INDERAL) 10 MG tablet AS NEEDED FOR PALPITATIONS  30 tablet  1  . psyllium (METAMUCIL) 0.52 G capsule Take 0.52 g by mouth 2 (two) times daily.        Marland Kitchen VIGAMOX 0.5 % ophthalmic solution Place 1 drop into both eyes as directed.       Marland Kitchen DISCONTD: ezetimibe  (ZETIA) 10 MG tablet Take 1 tablet (10 mg total) by mouth daily.  30 tablet  11    BP 136/80  Pulse 72  Temp 98.2 F (36.8 C)  Resp 16  Ht 5\' 5"  (1.651 m)  Wt 147 lb (66.679 kg)  BMI 24.46 kg/m2       Objective:   Physical Exam  Nursing note and vitals reviewed. Constitutional: He appears well-developed and well-nourished.  HENT:  Head: Normocephalic and atraumatic.  Eyes: Conjunctivae are normal. Pupils are equal, round, and reactive to light.  Neck: Normal range of motion. Neck supple.  Cardiovascular: Normal rate.   Murmur heard. Pulmonary/Chest: Effort normal and breath sounds normal.  Abdominal: Soft. Bowel sounds are normal.  Genitourinary: Rectum normal and prostate normal.  Musculoskeletal: Normal range of motion.  Skin: Skin is warm and dry.          Assessment & Plan:  Discussion of AF, hypothyroidism alcohol use May have hyperthyroid Had hx of hypokalemia Has noted marked "diuresis" monitor TSH, T3 T4 and bmet monitor prostate and PSA  Medicare welness visit   Subjective:    Ryan [name] is a 66 y.o. male who presents for Medicare Initial preventive examination.   Preventive Screening-Counseling & Management  Tobacco History  Smoking status  . Current Everyday Smoker -- 0.5 packs/day  . Types: Cigarettes  Smokeless tobacco  . Former Neurosurgeon    Problems Prior to Visit 1.   Current Problems (verified) Patient Active Problem List  Diagnosis  . HYPERLIPIDEMIA  . DYSTHYMIC DISORDER  . SMOKER  . ADJUSTMENT DISORDER WITH DEPRESSED MOOD  . ANGER  . DEGENERATION, MACULAR NOS  . HYPERTENSION  . MAXILLARY SINUSITIS  . CHRONIC OBSTRUCTIVE PULMONARY DISEASE  . GERD  . 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 OTHER SEBORRHEIC DERMATITIS  . LUMBAR RADICULOPATHY, RIGHT  . LEG PAIN, RIGHT  . LUMBAR SPRAIN AND STRAIN  . UNS ADVRS EFF UNS RX MEDICINAL&BIOLOGICAL SBSTNC  . Chest pain  . CAD (coronary atherosclerotic disease)  .  Atrial flutter with rapid ventricular response  . ETOH abuse    Medications Prior to Visit Current Outpatient Prescriptions on File Prior to Visit  Medication Sig Dispense Refill  . aspirin EC 81 MG EC tablet Take 81 mg by mouth daily.        Marland Kitchen ezetimibe (ZETIA) 10 MG tablet Take 1 tablet (10 mg total) by mouth daily.  90 tablet  3  .  Lactobacillus (ACIDOPHILUS) 10 MG CAPS Take by mouth daily.        . Multiple Vitamins-Minerals (ICAPS MV) TABS Take 1 tablet by mouth 2 (two) times daily.       . multivitamin (RENA-VIT) TABS tablet Take 1 tablet by mouth daily.    0  . omeprazole (PRILOSEC) 20 MG capsule Take 20 mg by mouth 2 (two) times daily.       . propranolol (INDERAL) 10 MG tablet AS NEEDED FOR PALPITATIONS  30 tablet  1  . psyllium (METAMUCIL) 0.52 G capsule Take 0.52 g by mouth 2 (two) times daily.        Marland Kitchen VIGAMOX 0.5 % ophthalmic solution Place 1 drop into both eyes as directed.       Marland Kitchen amiodarone (PACERONE) 200 MG tablet Take 1 tablet (200 mg total) by mouth daily.  90 tablet  3    Current Medications (verified) Current Outpatient Prescriptions  Medication Sig Dispense Refill  . aspirin EC 81 MG EC tablet Take 81 mg by mouth daily.        Marland Kitchen ezetimibe (ZETIA) 10 MG tablet Take 1 tablet (10 mg total) by mouth daily.  90 tablet  3  . Lactobacillus (ACIDOPHILUS) 10 MG CAPS Take by mouth daily.        . Multiple Vitamins-Minerals (ICAPS MV) TABS Take 1 tablet by mouth 2 (two) times daily.       . multivitamin (RENA-VIT) TABS tablet Take 1 tablet by mouth daily.    0  . omeprazole (PRILOSEC) 20 MG capsule Take 20 mg by mouth 2 (two) times daily.       . propranolol (INDERAL) 10 MG tablet AS NEEDED FOR PALPITATIONS  30 tablet  1  . psyllium (METAMUCIL) 0.52 G capsule Take 0.52 g by mouth 2 (two) times daily.        Marland Kitchen VIGAMOX 0.5 % ophthalmic solution Place 1 drop into both eyes as directed.       Marland Kitchen amiodarone (PACERONE) 200 MG tablet Take 1 tablet (200 mg total) by mouth daily.  90  tablet  3     Allergies (verified) Polymyxin b-trimethoprim; Atorvastatin; Ciprofloxacin; Codeine phosphate; and Simvastatin   PAST HISTORY  Family History Family History  Problem Relation Age of Onset  . Liver disease    . Prostate cancer    . Coronary artery disease    . Heart disease Mother   . Diabetes Mother   . Cirrhosis Mother   . Heart disease Father   . Cancer Father     prostate  . Heart disease Sister   . Arthritis Sister   . Heart disease Brother     Social History History  Substance Use Topics  . Smoking status: Current Everyday Smoker -- 0.5 packs/day    Types: Cigarettes  . Smokeless tobacco: Former Neurosurgeon  . Alcohol Use: No    Are there smokers in your home (other than you)?  No  Risk Factors Current exercise habits: Home exercise routine includes treadmill.  Dietary issues discussed: none    Cardiac risk factors: dyslipidemia, family history of premature cardiovascular disease, hypertension, male gender and smoking/ tobacco exposure.  Depression Screen (Note: if answer to either of the following is "Yes", a more complete depression screening is indicated)   Q1: Over the past two weeks, have you felt down, depressed or hopeless? Yes  Q2: Over the past two weeks, have you felt little interest or pleasure in doing things? No  Have  you lost interest or pleasure in daily life? No  Do you often feel hopeless? No  Do you cry easily over simple problems? No  Activities of Daily Living In your present state of health, do you have any difficulty performing the following activities?:  Driving? No Managing money?  No Feeding yourself? No Getting from bed to chair? No Climbing a flight of stairs? No Preparing food and eating?: No Bathing or showering? No Getting dressed: No Getting to the toilet? No Using the toilet:No Moving around from place to place: No In the past year have you fallen or had a near fall?:No   Are you sexually active?  Yes  Do  you have more than one partner?  No  Hearing Difficulties: No Do you often ask people to speak up or repeat themselves? No Do you experience ringing or noises in your ears? No Do you have difficulty understanding soft or whispered voices? No   Do you feel that you have a problem with memory? No  Do you often misplace items? No  Do you feel safe at home?  No  Cognitive Testing  Alert? Yes  Normal Appearance?Yes  Oriented to person? Yes  Place? Yes   Time? Yes  Recall of three objects?  Yes  Can perform simple calculations? Yes  Displays appropriate judgment?Yes  Can read the correct time from a watch face?Yes   Advanced Directives have been discussed with the patient? Yes   List the Names of Other Physician/Practitioners you currently use: 1.    Indicate any recent Medical Services you may have received from other than [facility] providers in the past year (date may be approximate).  Immunization History  Administered Date(s) Administered  . Influenza Split 07/06/2011  . Influenza Whole 10/05/2000, 07/18/2009, 06/20/2010  . Td 10/05/2002  . Tdap 05/06/2012  . Zoster 03/27/2009    Screening Tests Health Maintenance  Topic Date Due  . Pneumococcal Polysaccharide Vaccine Age 5 And Over  01/14/2012  . Influenza Vaccine  07/05/2012  . Colonoscopy  05/02/2014  . Tetanus/tdap  05/06/2022  . Zostavax  Completed    All answers were reviewed with the patient and necessary referrals were made:  Carrie Mew, MD   06/08/2012   History reviewed: allergies, current medications, past family history, past medical history, past social history, past surgical history and problem list  Review of Systems A comprehensive review of systems was negative.    Objective:     Vision by Snellen chart: right eye:20/20, left eye:20/20 corrected Blood pressure 136/80, pulse 72, temperature 98.2 F (36.8 C), resp. rate 16, height 5\' 5"  (1.651 m), weight 147 lb (66.679 kg). Body mass index is  24.46 kg/(m^2).  BP 136/80  Pulse 72  Temp 98.2 F (36.8 C)  Resp 16  Ht 5\' 5"  (1.651 m)  Wt 147 lb (66.679 kg)  BMI 24.46 kg/m2  General Appearance:    Alert, cooperative, no distress, appears stated age  Head:    Normocephalic, without obvious abnormality, atraumatic  Eyes:    PERRL, conjunctiva/corneas clear, EOM's intact, fundi    benign, both eyes       Ears:    Normal TM's and external ear canals, both ears  Nose:   Nares normal, septum midline, mucosa normal, no drainage    or sinus tenderness  Throat:   Lips, mucosa, and tongue normal; teeth and gums normal  Neck:   Supple, symmetrical, trachea midline, no adenopathy;       thyroid:  No enlargement/tenderness/nodules; no carotid   bruit or JVD  Back:     Symmetric, no curvature, ROM normal, no CVA tenderness  Lungs:     Clear to auscultation bilaterally, respirations unlabored  Chest wall:    No tenderness or deformity  Heart:    Regular rate and rhythm, S1 and S2 normal, no murmur, rub   or gallop  Abdomen:     Soft, non-tender, bowel sounds active all four quadrants,    no masses, no organomegaly  Genitalia:    Normal male without lesion, discharge or tenderness  Rectal:    Normal tone, normal prostate, no masses or tenderness;   guaiac negative stool  Extremities:   Extremities normal, atraumatic, no cyanosis or edema  Pulses:   2+ and symmetric all extremities  Skin:   Skin color, texture, turgor normal, no rashes or lesions  Lymph nodes:   Cervical, supraclavicular, and axillary nodes normal  Neurologic:   CNII-XII intact. Normal strength, sensation and reflexes      throughout       Assessment:      Patient presents for yearly preventative medicine examination.   all immunizations and health maintenance protocols were reviewed with the patient and they are up to date with these protocols.   screening laboratory values were reviewed with the patient including screening of hyperlipidemia PSA renal function  and hepatic function.   There medications past medical history social history problem list and allergies were reviewed in detail.   Goals were established with regard to weight loss exercise diet in compliance with medications      Plan:     During the course of the visit the patient was educated and counseled about appropriate screening and preventive services including:    Pneumococcal vaccine   Td vaccine  Prostate cancer screening  Smoking cessation counseling  Diet review for nutrition referral? Yes ____  Not Indicated _x___   Patient Instructions (the written plan) was given to the patient.  Medicare Attestation I have personally reviewed: The patient's medical and social history Their use of alcohol, tobacco or illicit drugs Their current medications and supplements The patient's functional ability including ADLs,fall risks, home safety risks, cognitive, and hearing and visual impairment Diet and physical activities Evidence for depression or mood disorders  The patient's weight, height, BMI, and visual acuity have been recorded in the chart.  I have made referrals, counseling, and provided education to the patient based on review of the above and I have provided the patient with a written personalized care plan for preventive services.     Carrie Mew, MD   06/08/2012

## 2012-05-24 ENCOUNTER — Encounter: Payer: Medicare Other | Admitting: Cardiovascular Disease

## 2012-05-27 ENCOUNTER — Encounter: Payer: Self-pay | Admitting: Internal Medicine

## 2012-05-27 ENCOUNTER — Ambulatory Visit (INDEPENDENT_AMBULATORY_CARE_PROVIDER_SITE_OTHER): Payer: Medicare Other | Admitting: Internal Medicine

## 2012-05-27 VITALS — BP 130/88 | HR 60 | Ht 65.0 in | Wt 149.1 lb

## 2012-05-27 DIAGNOSIS — I4892 Unspecified atrial flutter: Secondary | ICD-10-CM

## 2012-05-27 DIAGNOSIS — R5383 Other fatigue: Secondary | ICD-10-CM

## 2012-05-27 DIAGNOSIS — I10 Essential (primary) hypertension: Secondary | ICD-10-CM

## 2012-05-27 DIAGNOSIS — R5381 Other malaise: Secondary | ICD-10-CM | POA: Diagnosis not present

## 2012-05-27 MED ORDER — AMIODARONE HCL 200 MG PO TABS: 200.0000 mg | ORAL_TABLET | Freq: Every day | ORAL | Status: DC

## 2012-05-27 NOTE — Patient Instructions (Signed)
Your physician recommends that you schedule a follow-up appointment as needed  

## 2012-06-15 DIAGNOSIS — H353190 Nonexudative age-related macular degeneration, unspecified eye, stage unspecified: Secondary | ICD-10-CM | POA: Diagnosis not present

## 2012-06-15 DIAGNOSIS — H353290 Exudative age-related macular degeneration, unspecified eye, stage unspecified: Secondary | ICD-10-CM | POA: Diagnosis not present

## 2012-06-15 DIAGNOSIS — H2510 Age-related nuclear cataract, unspecified eye: Secondary | ICD-10-CM | POA: Diagnosis not present

## 2012-06-23 ENCOUNTER — Other Ambulatory Visit: Payer: Medicare Other

## 2012-06-27 ENCOUNTER — Telehealth: Payer: Self-pay | Admitting: Internal Medicine

## 2012-06-27 NOTE — Telephone Encounter (Signed)
Patient called to cancel appt as he will not be in town, however patient states that he need an appt on 9/30, 10/1, or 10/02 and is requesting to be worked in. Please assist.

## 2012-06-28 ENCOUNTER — Encounter: Payer: Self-pay | Admitting: Internal Medicine

## 2012-06-28 NOTE — Progress Notes (Signed)
HPI Ryan [name] is referred by Dr. Eden Emms for evaluation of atrial fibrillation and flutter. The patient was hospitalized several months ago with palpitations. He had atrial fibrillation and was reported to have atrial flutter the way of review his ECGs and can only find atrial flutter ablation. He reverted to sinus rhythm. He was placed on amiodarone therapy. He is low risk for stroke. He is only on low-dose aspirin for thromboembolic prevention. The patient has done well since discharge from the hospital. He denies recurrent tachypalpitations. No syncope. No peripheral edema. No history or evidence of thyroid dysfunction. Allergies  Allergen Reactions  . Polymyxin B-Trimethoprim     Eye drops  . Atorvastatin     REACTION: unspecified  . Ciprofloxacin     REACTION: unspecified  . Codeine Phosphate     REACTION: unspecified  . Simvastatin     REACTION: unspecified     Current Outpatient Prescriptions  Medication Sig Dispense Refill  . amiodarone (PACERONE) 200 MG tablet Take 1 tablet (200 mg total) by mouth daily.  90 tablet  3  . aspirin EC 81 MG EC tablet Take 81 mg by mouth daily.        Marland Kitchen ezetimibe (ZETIA) 10 MG tablet Take 1 tablet (10 mg total) by mouth daily.  90 tablet  3  . Lactobacillus (ACIDOPHILUS) 10 MG CAPS Take by mouth daily.        . Multiple Vitamins-Minerals (ICAPS MV) TABS Take 1 tablet by mouth 2 (two) times daily.       . multivitamin (RENA-VIT) TABS tablet Take 1 tablet by mouth daily.    0  . omeprazole (PRILOSEC) 20 MG capsule Take 20 mg by mouth 2 (two) times daily.       . propranolol (INDERAL) 10 MG tablet AS NEEDED FOR PALPITATIONS  30 tablet  1  . psyllium (METAMUCIL) 0.52 G capsule Take 0.52 g by mouth 2 (two) times daily.        Marland Kitchen VIGAMOX 0.5 % ophthalmic solution Place 1 drop into both eyes as directed.          Past Medical History  Diagnosis Date  . H/O: rheumatic fever   . Kidney stones   . GERD (gastroesophageal reflux disease)   . Heart murmur    . Anemia   . Hypertension   . Macular degeneration   . Hyperlipidemia   . History of colonoscopy   . Heart murmur   . COPD (chronic obstructive pulmonary disease)   . Atrial flutter 03/31/2012    converted spontaneously to sinus  . High risk medication use     on amiodarone since 03/31/2012  . CAD (coronary artery disease)     reportedly moderate CAD; managed medically    ROS:   All systems reviewed and negative except as noted in the HPI.   Past Surgical History  Procedure Date  . Cervical discectomy   . Cholecystectomy   . Mouth surgery   . Knee cartiledge   . Foot surgery   . Appendectomy   . Cardiac catheterization 04/22/2011    moderate left main and RCA stenosis not significant by FFR and IVUS on medical therapy     Family History  Problem Relation Age of Onset  . Liver disease    . Prostate cancer    . Coronary artery disease    . Heart disease Mother   . Diabetes Mother   . Cirrhosis Mother   . Heart disease Father   .  Cancer Father     prostate  . Heart disease Sister   . Arthritis Sister   . Heart disease Brother      History   Social History  . Marital Status: Married    Spouse Name: N/A    Number of Children: N/A  . Years of Education: N/A   Occupational History  . retired    Social History Main Topics  . Smoking status: Current Every Day Smoker -- 0.5 packs/day    Types: Cigarettes  . Smokeless tobacco: Former Neurosurgeon  . Alcohol Use: No  . Drug Use: No  . Sexually Active: Yes   Other Topics Concern  . Not on file   Social History Narrative  . No narrative on file     BP 130/88  Pulse 60  Ht 5\' 5"  (1.651 m)  Wt 149 lb 1.9 oz (67.64 kg)  BMI 24.81 kg/m2  Physical Exam:  Well appearing middle-aged man, NAD HEENT: Unremarkable Neck:  No JVD, no thyromegally Lungs:  Clear with no wheezes, rales, or rhonchi. HEART:  Regular rate rhythm, no murmurs, no rubs, no clicks Abd:  soft, positive bowel sounds, no organomegally, no  rebound, no guarding Ext:  2 plus pulses, no edema, no cyanosis, no clubbing Skin:  No rashes no nodules Neuro:  CN II through XII intact, motor grossly intact  EKG Normal sinus rhythm  Assess/Plan:

## 2012-06-28 NOTE — Assessment & Plan Note (Signed)
I've reviewed the patient's ECGs from the hospital. A very clearly show atrial fibrillation. While he may have also had atrial flutter, I can find no evidence of it. He is maintaining sinus rhythm on low-dose amiodarone. I discussed treatment options with the patient. I would allow him to stay on amiodarone for 6-12 months and then plan to discontinue this drug. I will plan to see the patient back in approximately 6 months. No indication for anticoagulation with warfarin at this time.

## 2012-06-28 NOTE — Assessment & Plan Note (Signed)
His blood pressure is well controlled. He will continue his current medical therapy and maintain a low-sodium diet. 

## 2012-06-28 NOTE — Telephone Encounter (Signed)
Appointment given for 10-2

## 2012-06-30 ENCOUNTER — Ambulatory Visit: Payer: Medicare Other | Admitting: Internal Medicine

## 2012-07-01 ENCOUNTER — Ambulatory Visit: Payer: Medicare Other | Admitting: Internal Medicine

## 2012-07-04 DIAGNOSIS — Z23 Encounter for immunization: Secondary | ICD-10-CM | POA: Diagnosis not present

## 2012-07-06 ENCOUNTER — Ambulatory Visit: Payer: Medicare Other | Admitting: Internal Medicine

## 2012-07-06 ENCOUNTER — Ambulatory Visit (INDEPENDENT_AMBULATORY_CARE_PROVIDER_SITE_OTHER): Payer: Medicare Other | Admitting: Internal Medicine

## 2012-07-06 VITALS — BP 122/68 | HR 72 | Temp 98.3°F | Resp 16 | Ht 65.0 in | Wt 152.0 lb

## 2012-07-06 DIAGNOSIS — I4891 Unspecified atrial fibrillation: Secondary | ICD-10-CM

## 2012-07-06 DIAGNOSIS — IMO0002 Reserved for concepts with insufficient information to code with codable children: Secondary | ICD-10-CM

## 2012-07-06 NOTE — Progress Notes (Signed)
  Subjective:    Patient ID: Ryan [name], male    DOB: Jan 07, 1946, 66 y.o.   MRN: [id number]  HPI Patient had symptomatic atrial fibrillation now is in sinus rhythm on amiodarone.  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  He is currently completely absent without call and has had a significant improvement in his overall rhythm. This may indicate that his atrial fibrillation was related to holiday heart or alcohol irritation of the heart muscle we will monitor him for stability and consider a drug holiday to test if he continues to do as well as he has.  He is tolerating the medication well his liver functions and CBC parameters are all normal his thyroid is normal the medication.     Review of Systems  Constitutional: Negative for fever and fatigue.  HENT: Negative for hearing loss, congestion, neck pain and postnasal drip.   Eyes: Negative for discharge, redness and visual disturbance.  Respiratory: Negative for cough, shortness of breath and wheezing.   Cardiovascular: Negative for leg swelling.  Gastrointestinal: Negative for abdominal pain, constipation and abdominal distention.  Genitourinary: Negative for urgency and frequency.  Musculoskeletal: Negative for joint swelling and arthralgias.  Skin: Negative for color change and rash.  Neurological: Negative for weakness and light-headedness.  Hematological: Negative for adenopathy.  Psychiatric/Behavioral: Negative for behavioral problems.       Objective:   Physical Exam  Nursing note and vitals reviewed. Constitutional: He is oriented to person, place, and time. He appears well-developed and well-nourished.  HENT:  Head: Normocephalic and atraumatic.  Eyes: Conjunctivae normal are normal. Pupils are equal, round, and reactive to light.  Neck: Normal range of motion. Neck supple.  Cardiovascular: Normal rate and  regular rhythm.   Pulmonary/Chest: Effort normal and breath sounds normal.  Abdominal: Soft. Bowel sounds are normal.  Neurological: He is alert and oriented to person, place, and time.  Skin: Skin is warm and dry.          Assessment & Plan:  Result atrial fibrillation.  Still has a risk of recurrent PACs and PAT therefore he will continue on amiodarone for the time being with careful monitoring off all alcohol.  We'll consider a drug holiday with careful monitoring after about 6 months to one year therapy.  He is discussed catheter ablation with the cardiologist but at this time wishes to pursue conservative therapy also in light of the fact that this may represent holiday heart atrial fibrillation.  He is stable from the standpoint of his lipids all blood work monitored stable

## 2012-07-20 DIAGNOSIS — M48061 Spinal stenosis, lumbar region without neurogenic claudication: Secondary | ICD-10-CM | POA: Diagnosis not present

## 2012-08-01 DIAGNOSIS — IMO0002 Reserved for concepts with insufficient information to code with codable children: Secondary | ICD-10-CM | POA: Diagnosis not present

## 2012-08-01 DIAGNOSIS — M48061 Spinal stenosis, lumbar region without neurogenic claudication: Secondary | ICD-10-CM | POA: Diagnosis not present

## 2012-08-03 DIAGNOSIS — L259 Unspecified contact dermatitis, unspecified cause: Secondary | ICD-10-CM | POA: Diagnosis not present

## 2012-08-03 DIAGNOSIS — L578 Other skin changes due to chronic exposure to nonionizing radiation: Secondary | ICD-10-CM | POA: Diagnosis not present

## 2012-08-03 DIAGNOSIS — L723 Sebaceous cyst: Secondary | ICD-10-CM | POA: Diagnosis not present

## 2012-08-20 ENCOUNTER — Emergency Department (HOSPITAL_BASED_OUTPATIENT_CLINIC_OR_DEPARTMENT_OTHER): Payer: Medicare Other

## 2012-08-20 ENCOUNTER — Encounter (HOSPITAL_BASED_OUTPATIENT_CLINIC_OR_DEPARTMENT_OTHER): Payer: Self-pay | Admitting: *Deleted

## 2012-08-20 ENCOUNTER — Emergency Department (HOSPITAL_BASED_OUTPATIENT_CLINIC_OR_DEPARTMENT_OTHER)
Admission: EM | Admit: 2012-08-20 | Discharge: 2012-08-20 | Disposition: A | Discharge status: Home / Self Care | Payer: Medicare Other | Attending: Emergency Medicine | Admitting: Emergency Medicine

## 2012-08-20 DIAGNOSIS — J449 Chronic obstructive pulmonary disease, unspecified: Secondary | ICD-10-CM | POA: Diagnosis not present

## 2012-08-20 DIAGNOSIS — M25819 Other specified joint disorders, unspecified shoulder: Secondary | ICD-10-CM | POA: Insufficient documentation

## 2012-08-20 DIAGNOSIS — I2510 Atherosclerotic heart disease of native coronary artery without angina pectoris: Secondary | ICD-10-CM | POA: Diagnosis not present

## 2012-08-20 DIAGNOSIS — Z8679 Personal history of other diseases of the circulatory system: Secondary | ICD-10-CM | POA: Diagnosis not present

## 2012-08-20 DIAGNOSIS — M549 Dorsalgia, unspecified: Secondary | ICD-10-CM | POA: Diagnosis not present

## 2012-08-20 DIAGNOSIS — I4892 Unspecified atrial flutter: Secondary | ICD-10-CM | POA: Insufficient documentation

## 2012-08-20 DIAGNOSIS — I10 Essential (primary) hypertension: Secondary | ICD-10-CM | POA: Insufficient documentation

## 2012-08-20 DIAGNOSIS — M19019 Primary osteoarthritis, unspecified shoulder: Secondary | ICD-10-CM | POA: Diagnosis not present

## 2012-08-20 DIAGNOSIS — E785 Hyperlipidemia, unspecified: Secondary | ICD-10-CM | POA: Insufficient documentation

## 2012-08-20 DIAGNOSIS — Z8719 Personal history of other diseases of the digestive system: Secondary | ICD-10-CM | POA: Diagnosis not present

## 2012-08-20 DIAGNOSIS — J4489 Other specified chronic obstructive pulmonary disease: Secondary | ICD-10-CM | POA: Insufficient documentation

## 2012-08-20 DIAGNOSIS — M7540 Impingement syndrome of unspecified shoulder: Secondary | ICD-10-CM

## 2012-08-20 DIAGNOSIS — Z87442 Personal history of urinary calculi: Secondary | ICD-10-CM | POA: Insufficient documentation

## 2012-08-20 DIAGNOSIS — Z7982 Long term (current) use of aspirin: Secondary | ICD-10-CM | POA: Insufficient documentation

## 2012-08-20 DIAGNOSIS — X503XXA Overexertion from repetitive movements, initial encounter: Secondary | ICD-10-CM | POA: Insufficient documentation

## 2012-08-20 DIAGNOSIS — M75100 Unspecified rotator cuff tear or rupture of unspecified shoulder, not specified as traumatic: Secondary | ICD-10-CM | POA: Diagnosis not present

## 2012-08-20 DIAGNOSIS — H3530 Unspecified macular degeneration: Secondary | ICD-10-CM | POA: Diagnosis not present

## 2012-08-20 DIAGNOSIS — K219 Gastro-esophageal reflux disease without esophagitis: Secondary | ICD-10-CM | POA: Diagnosis not present

## 2012-08-20 DIAGNOSIS — IMO0002 Reserved for concepts with insufficient information to code with codable children: Secondary | ICD-10-CM | POA: Diagnosis not present

## 2012-08-20 DIAGNOSIS — X500XXA Overexertion from strenuous movement or load, initial encounter: Secondary | ICD-10-CM | POA: Insufficient documentation

## 2012-08-20 DIAGNOSIS — Y929 Unspecified place or not applicable: Secondary | ICD-10-CM | POA: Insufficient documentation

## 2012-08-20 DIAGNOSIS — F17200 Nicotine dependence, unspecified, uncomplicated: Secondary | ICD-10-CM | POA: Insufficient documentation

## 2012-08-20 DIAGNOSIS — Y9389 Activity, other specified: Secondary | ICD-10-CM | POA: Insufficient documentation

## 2012-08-20 MED ORDER — DIAZEPAM 5 MG PO TABS
5.0000 mg | ORAL_TABLET | Freq: Once | ORAL | Status: AC
Start: 2012-08-20 — End: 2012-08-20
  Administered 2012-08-20: 5 mg via ORAL
  Filled 2012-08-20: qty 1

## 2012-08-20 MED ORDER — HYDROCODONE-ACETAMINOPHEN 5-325 MG PO TABS: ORAL_TABLET | ORAL | Status: DC

## 2012-08-20 MED ORDER — DIAZEPAM 5 MG PO TABS: 5.0000 mg | ORAL_TABLET | Freq: Three times a day (TID) | ORAL | Status: DC | PRN

## 2012-08-20 MED ORDER — HYDROMORPHONE HCL PF 2 MG/ML IJ SOLN
2.0000 mg | Freq: Once | INTRAMUSCULAR | Status: AC
  Administered 2012-08-20: 2 mg via INTRAMUSCULAR
  Filled 2012-08-20: qty 1

## 2012-08-20 NOTE — ED Notes (Signed)
MD at bedside. 

## 2012-08-20 NOTE — ED Notes (Signed)
Pt states he was lifting a ladder last Sunday and felt like he pulled something. Then, Monday he was loading deer corn and reinjured it. Now c/o pain to same which radiates down his arm. Denies other s/s. Cardiac history.

## 2012-08-20 NOTE — ED Provider Notes (Addendum)
History  This chart was scribed for Ryan [name]. Ryan Lamas, MD by Ryan [name], ED Scribe. This patient was seen in room MH07/MH07 and the patient's care was started at 1545.  CSN: [id number]  Arrival date & time 08/20/12  1459   First MD Initiated Contact with Patient 08/20/12 1545      Chief Complaint  Patient presents with  . Shoulder Pain    The history is provided by the patient and the spouse. No language interpreter was used.    Ryan [name] is a 66 y.o. male who presents to the Emergency Department complaining of progressively worsening, gradual onset, moderate to severe, left upper back pain onset 5 days ago due to lifting a ladder that he felt like he pulled something. Pain radiates down to his left arm. Pain is aggravated with movement and palpation. There are no modifying factors. There are no associated symptoms. Patient denies fever, chills, nausea, numbness, and abdominal pain. Patient has a history of GERD, CAD, and kidney stone. He is a current everyday smoker but he denies alcohol use.   Past Medical History  Diagnosis Date  . H/O: rheumatic fever   . Kidney stones   . GERD (gastroesophageal reflux disease)   . Heart murmur   . Anemia   . Hypertension   . Macular degeneration   . Hyperlipidemia   . History of colonoscopy   . Heart murmur   . COPD (chronic obstructive pulmonary disease)   . Atrial flutter 03/31/2012    converted spontaneously to sinus  . High risk medication use     on amiodarone since 03/31/2012  . CAD (coronary artery disease)     reportedly moderate CAD; managed medically    Past Surgical History  Procedure Date  . Cervical discectomy   . Cholecystectomy   . Mouth surgery   . Knee cartiledge   . Foot surgery   . Appendectomy   . Cardiac catheterization 04/22/2011    moderate left main and RCA stenosis not significant by FFR and IVUS on medical therapy    Family History  Problem Relation Age of Onset  . Liver disease    .  Prostate cancer    . Coronary artery disease    . Heart disease Mother   . Diabetes Mother   . Cirrhosis Mother   . Heart disease Father   . Cancer Father     prostate  . Heart disease Sister   . Arthritis Sister   . Heart disease Brother     History  Substance Use Topics  . Smoking status: Current Every Day Smoker -- 0.5 packs/day    Types: Cigarettes  . Smokeless tobacco: Former Neurosurgeon  . Alcohol Use: No      Review of Systems  Constitutional: Negative for fever and chills.  Gastrointestinal: Negative for nausea and abdominal pain.  Musculoskeletal: Positive for back pain.  Neurological: Negative for numbness.  All other systems reviewed and are negative.    Allergies  Polymyxin b-trimethoprim; Atorvastatin; Ciprofloxacin; Codeine phosphate; and Simvastatin  Home Medications   Current Outpatient Rx  Name  Route  Sig  Dispense  Refill  . AMIODARONE HCL 200 MG PO TABS   Oral   Take 1 tablet (200 mg total) by mouth daily.   90 tablet   3   . ASPIRIN EC 81 MG PO TBEC   Oral   Take 81 mg by mouth daily.           Marland Kitchen  DIAZEPAM 5 MG PO TABS   Oral   Take 1 tablet (5 mg total) by mouth every 8 (eight) hours as needed (for muscle spasms).   15 tablet   0   . EZETIMIBE 10 MG PO TABS   Oral   Take 1 tablet (10 mg total) by mouth daily.   90 tablet   3     rx plan ID [id number]   . HYDROCODONE-ACETAMINOPHEN 5-325 MG PO TABS      1-2 tablets po q 6 hours prn moderate to severe pain   20 tablet   0   . ACIDOPHILUS 10 MG PO CAPS   Oral   Take by mouth daily.           . ICAPS MV PO TABS   Oral   Take 1 tablet by mouth 2 (two) times daily.          Marland Kitchen RENA-VITE PO TABS   Oral   Take 1 tablet by mouth daily.      0   . OMEPRAZOLE 20 MG PO CPDR   Oral   Take 20 mg by mouth 2 (two) times daily.          Marland Kitchen PROPRANOLOL HCL 10 MG PO TABS      AS NEEDED FOR PALPITATIONS   30 tablet   1   . PSYLLIUM 0.52 G PO CAPS   Oral   Take 0.52 g by  mouth 2 (two) times daily.           Marland Kitchen VIGAMOX 0.5 % OP SOLN   Both Eyes   Place 1 drop into both eyes as directed.            BP 163/68  Pulse 60  Temp 98.2 F (36.8 C) (Oral)  Resp 20  Ht 5\' 5"  (1.651 m)  Wt 143 lb (64.864 kg)  BMI 23.80 kg/m2  SpO2 100%  Physical Exam  Constitutional: He is oriented to person, place, and time. He appears well-developed and well-nourished. No distress.  HENT:  Head: Normocephalic and atraumatic.  Right Ear: External ear normal.  Left Ear: External ear normal.  Mouth/Throat: No oropharyngeal exudate.  Eyes: Conjunctivae normal and EOM are normal. Pupils are equal, round, and reactive to light.  Neck: Normal range of motion. Neck supple.  Cardiovascular: Normal rate, regular rhythm and normal heart sounds.   No murmur heard. Pulmonary/Chest: Effort normal and breath sounds normal. He has no wheezes.  Abdominal: Soft. There is no tenderness. There is no rebound and no guarding.  Musculoskeletal: He exhibits tenderness. He exhibits no edema.       Tenderness to medial triceps dips. Limited shoulder ROM.  Distal grip strength is normal.  Left rhomboid is tender.  He was slightly tilted while standing up due to muscle spasm in his upper thoracic back.  Normal distal pulses.   Neurological: He is alert and oriented to person, place, and time. He has normal reflexes. No cranial nerve deficit. Coordination normal.  Skin: Skin is warm and dry. No rash noted. No erythema.    ED Course  Procedures (including critical care time)  DIAGNOSTIC STUDIES: Oxygen Saturation is 100% on room air, normal by my interpretation.    COORDINATION OF CARE: 3:59 PM Discussed treatment plan with pt at bedside and pt agreed to plan.   Labs Reviewed - No data to display Dg Shoulder Left  08/20/2012  *RADIOLOGY REPORT*  Clinical Data: Shoulder pain after use.  LEFT SHOULDER -  2+ VIEW  Comparison: 03/31/2012 chest x-ray.  Findings: Acromioclavicular joint  degenerative changes.  No fracture or dislocation.  No abnormal soft tissue calcifications. Visualized lungs stable.  IMPRESSION: Acromioclavicular joint degenerative changes.   Original Report Authenticated By: Lacy Duverney, M.D.      1. Rotator cuff impingement syndrome    ECG at time 15:11 shows NSR at rate 60, normal axis, intervals, ST segments, normal ECG by my interpretation.  No change from ECG on April 01, 2012  5:00 PM After IM meds and valium, pt is much more relaxed adn pain is much improved.  Sling provided for comfort.  Pt can follow up with PCP and get referral to local PT which he has done in the past.    MDM  I personally performed the services described in this documentation, which was scribed in my presence. The recorded information has been reviewed and is accurate.       Ryan [name]. Ryan Lamas, MD 08/20/12 1700  Ryan [name]. Ryan Lamas, MD 08/20/12 [phone]

## 2012-08-20 NOTE — Discharge Instructions (Signed)
 Rotator Cuff Tendonitis  The rotator cuff is the collection of all the muscles and tendons (the supraspinatus, infraspinatus, subscapularis, and teres minor muscles and their tendons) that help your shoulder stay in place. This unit holds the head of the upper arm bone (humerus) in the cup (fossa) of the shoulder blade (scapula). Basically, it connects the arm to the shoulder. Tendinitis is a swelling and irritation of the tissue, called cord like structures (tendons) that connect muscle to bone. It usually is caused by overusing the joint involved. When the tissue surrounding a tendon (the synovium) becomes inflamed, it is called tenosynovitis. This also is often the result of overuse in people whose jobs require repetitive (over and over again) types of motion. HOME CARE INSTRUCTIONS   Use a sling or splint for as long as directed by your caregiver until the pain decreases.  Apply ice to the injury for 15 to 20 minutes, 3 to 4 times per day. Put the ice in a plastic bag and place a towel between the bag of ice and your skin.  Try to avoid use other than gentle range of motion while your shoulder is painful. Use and exercise only as directed by your caregiver. Stop exercises or range of motion if pain or discomfort increases, unless directed otherwise by your caregiver.  Only take over-the-counter or prescription medicines for pain, discomfort, or fever as directed by your caregiver.  If you were give a shoulder sling and straps (immobilizer), do not remove it except as directed, or until you see a caregiver for a follow-up examination. If you need to remove it, move your arm as little as possible or as directed.  You may want to sleep on several pillows at night to lessen swelling and pain. SEEK IMMEDIATE MEDICAL CARE IF:   Pain in your shoulder increases or new pain develops in your arm, hand, or fingers and is not relieved with medications.  You develop new, unexplained symptoms, especially  increased numbness in the hands or loss of strength, or you develop any worsening of the problems which brought you in for care.  Your arm, hand, or fingers are numb or tingling.  Your arm, hand, or fingers are swollen, painful, or turn white or blue. Document Released: 12/12/2003 Document Revised: 12/14/2011 Document Reviewed: 07/19/2008 [facility] Patient Information 2013 [place], [place].    Narcotic and benzodiazepine use may cause drowsiness, slowed breathing or dependence.  Please use with caution and do not drive, operate machinery or watch young children alone while taking them.  Taking combinations of these medications or drinking alcohol will potentiate these effects.

## 2012-08-23 DIAGNOSIS — M25819 Other specified joint disorders, unspecified shoulder: Secondary | ICD-10-CM | POA: Diagnosis not present

## 2012-08-23 DIAGNOSIS — M25519 Pain in unspecified shoulder: Secondary | ICD-10-CM | POA: Diagnosis not present

## 2012-09-14 ENCOUNTER — Telehealth: Payer: Self-pay | Admitting: Cardiovascular Disease

## 2012-09-14 NOTE — Telephone Encounter (Signed)
New problem:   C/O side effect from medication -  constipation  amiodarone  200 mg.  Would like to come off this medication please advise.

## 2012-09-14 NOTE — Telephone Encounter (Signed)
Not sure constipation really from amiodarone but ok to stop

## 2012-09-14 NOTE — Telephone Encounter (Signed)
Pt calls b/c he is having side effects from the Amiodarone. Primarily extreme constipation with occasional spotting of blood. He is taking stool softeners. Would like Dr. Eden Emms to review about discontinuing Amiodarone. Pt stated he was only to be on it possibly 2-6 more months.  Will forward to Dr. Lynnette Caffey RN

## 2012-09-14 NOTE — Telephone Encounter (Signed)
Pt aware he can stop Amiodarone. Mylo Red RN

## 2012-09-16 DIAGNOSIS — H2510 Age-related nuclear cataract, unspecified eye: Secondary | ICD-10-CM | POA: Diagnosis not present

## 2012-09-16 DIAGNOSIS — H353190 Nonexudative age-related macular degeneration, unspecified eye, stage unspecified: Secondary | ICD-10-CM | POA: Diagnosis not present

## 2012-09-16 DIAGNOSIS — H353290 Exudative age-related macular degeneration, unspecified eye, stage unspecified: Secondary | ICD-10-CM | POA: Diagnosis not present

## 2012-10-04 ENCOUNTER — Encounter: Payer: Self-pay | Admitting: Internal Medicine

## 2012-10-06 DIAGNOSIS — H353190 Nonexudative age-related macular degeneration, unspecified eye, stage unspecified: Secondary | ICD-10-CM | POA: Diagnosis not present

## 2012-10-06 DIAGNOSIS — D3130 Benign neoplasm of unspecified choroid: Secondary | ICD-10-CM | POA: Diagnosis not present

## 2012-10-06 DIAGNOSIS — H353290 Exudative age-related macular degeneration, unspecified eye, stage unspecified: Secondary | ICD-10-CM | POA: Diagnosis not present

## 2012-10-06 DIAGNOSIS — H43819 Vitreous degeneration, unspecified eye: Secondary | ICD-10-CM | POA: Diagnosis not present

## 2012-10-06 DIAGNOSIS — H2510 Age-related nuclear cataract, unspecified eye: Secondary | ICD-10-CM | POA: Diagnosis not present

## 2012-11-07 ENCOUNTER — Encounter: Payer: Self-pay | Admitting: Internal Medicine

## 2012-11-07 ENCOUNTER — Ambulatory Visit (INDEPENDENT_AMBULATORY_CARE_PROVIDER_SITE_OTHER): Payer: Medicare Other | Admitting: Internal Medicine

## 2012-11-07 VITALS — BP 118/72 | HR 68 | Temp 98.5°F | Resp 18 | Wt 154.0 lb

## 2012-11-07 DIAGNOSIS — T887XXA Unspecified adverse effect of drug or medicament, initial encounter: Secondary | ICD-10-CM | POA: Diagnosis not present

## 2012-11-07 DIAGNOSIS — T383X5A Adverse effect of insulin and oral hypoglycemic [antidiabetic] drugs, initial encounter: Secondary | ICD-10-CM

## 2012-11-07 DIAGNOSIS — I10 Essential (primary) hypertension: Secondary | ICD-10-CM | POA: Diagnosis not present

## 2012-11-07 DIAGNOSIS — E785 Hyperlipidemia, unspecified: Secondary | ICD-10-CM | POA: Diagnosis not present

## 2012-11-07 LAB — BASIC METABOLIC PANEL
BUN: 15 mg/dL (ref 6–23)
CO2: 28 mEq/L (ref 19–32)
Calcium: 9.7 mg/dL (ref 8.4–10.5)
Chloride: 102 mEq/L (ref 96–112)
Creatinine, Ser: 1 mg/dL (ref 0.4–1.5)
GFR: 78.6 mL/min (ref >60.00)
Glucose, Bld: 88 mg/dL (ref 70–99)
Potassium: 5.4 mEq/L — ABNORMAL HIGH (ref 3.5–5.1)
Sodium: 137 mEq/L (ref 135–145)

## 2012-11-07 LAB — HEPATIC FUNCTION PANEL
ALT: 14 U/L (ref 0–53)
AST: 21 U/L (ref 0–37)
Albumin: 4 g/dL (ref 3.5–5.2)
Alkaline Phosphatase: 67 U/L (ref 39–117)
Bilirubin, Direct: 0 mg/dL (ref 0.0–0.3)
Total Bilirubin: 0.4 mg/dL (ref 0.3–1.2)
Total Protein: 7.4 g/dL (ref 6.0–8.3)

## 2012-11-07 LAB — LIPID PANEL
Cholesterol: 195 mg/dL (ref 0–200)
HDL: 44.3 mg/dL (ref >39.00)
LDL Cholesterol: 138 mg/dL — ABNORMAL HIGH (ref 0–99)
Total CHOL/HDL Ratio: 4
Triglycerides: 66 mg/dL (ref 0.0–149.0)
VLDL: 13.2 mg/dL (ref 0.0–40.0)

## 2012-11-07 NOTE — Patient Instructions (Addendum)
The patient is instructed to continue all medications as prescribed. Schedule followup with check out clerk upon leaving the clinic  

## 2012-11-07 NOTE — Progress Notes (Signed)
  Subjective:    Patient ID: Ryan [name], male    DOB: 05-29-1946, 67 y.o.   MRN: [id number]  HPI The patient has been frustrated by the last wellness visit ( welcome to medicare) The visit was coded as a wellness not a welcome to medicare.    Review of Systems  Constitutional: Negative for fever and fatigue.  HENT: Negative for hearing loss, congestion, neck pain and postnasal drip.   Eyes: Negative for discharge, redness and visual disturbance.  Respiratory: Negative for cough, shortness of breath and wheezing.   Cardiovascular: Negative for leg swelling.  Gastrointestinal: Negative for abdominal pain, constipation and abdominal distention.  Genitourinary: Negative for urgency and frequency.  Musculoskeletal: Negative for joint swelling and arthralgias.  Skin: Negative for color change and rash.  Neurological: Negative for weakness and light-headedness.  Hematological: Negative for adenopathy.  Psychiatric/Behavioral: Negative for behavioral problems.       Objective:   Physical Exam  Nursing note and vitals reviewed. Constitutional: He appears well-developed and well-nourished.  HENT:  Head: Normocephalic and atraumatic.  Eyes: Conjunctivae normal are normal. Pupils are equal, round, and reactive to light.  Neck: Normal range of motion. Neck supple.  Cardiovascular: Normal rate and regular rhythm.   Murmur heard. Pulmonary/Chest: Effort normal and breath sounds normal.  Abdominal: Soft. Bowel sounds are normal.          Assessment & Plan:  The "initial medicare wellness" should have been a welcome to medicare visit.  Off the amiodarone and feels better has "prn" propranolol for rapid AF Seeing Ladona Ridgel and [employer]. I think he had "holiday" heart  And has stopped alcohol. This is a permanent strategy.  Chest pain is intermittent and not exertional he has returned to NSR monitoring of lipid and liver

## 2012-11-16 DIAGNOSIS — H43819 Vitreous degeneration, unspecified eye: Secondary | ICD-10-CM | POA: Diagnosis not present

## 2012-11-16 DIAGNOSIS — H353190 Nonexudative age-related macular degeneration, unspecified eye, stage unspecified: Secondary | ICD-10-CM | POA: Diagnosis not present

## 2012-11-16 DIAGNOSIS — H2510 Age-related nuclear cataract, unspecified eye: Secondary | ICD-10-CM | POA: Diagnosis not present

## 2012-11-16 DIAGNOSIS — H353290 Exudative age-related macular degeneration, unspecified eye, stage unspecified: Secondary | ICD-10-CM | POA: Diagnosis not present

## 2012-11-19 ENCOUNTER — Other Ambulatory Visit: Payer: Self-pay

## 2012-12-13 DIAGNOSIS — K625 Hemorrhage of anus and rectum: Secondary | ICD-10-CM | POA: Diagnosis not present

## 2012-12-13 DIAGNOSIS — K5900 Constipation, unspecified: Secondary | ICD-10-CM | POA: Diagnosis not present

## 2012-12-22 ENCOUNTER — Other Ambulatory Visit: Payer: Self-pay | Admitting: Internal Medicine

## 2012-12-26 DIAGNOSIS — M48061 Spinal stenosis, lumbar region without neurogenic claudication: Secondary | ICD-10-CM | POA: Diagnosis not present

## 2013-01-02 DIAGNOSIS — K5900 Constipation, unspecified: Secondary | ICD-10-CM | POA: Diagnosis not present

## 2013-01-04 DIAGNOSIS — H2510 Age-related nuclear cataract, unspecified eye: Secondary | ICD-10-CM | POA: Diagnosis not present

## 2013-01-04 DIAGNOSIS — H43819 Vitreous degeneration, unspecified eye: Secondary | ICD-10-CM | POA: Diagnosis not present

## 2013-01-04 DIAGNOSIS — H353290 Exudative age-related macular degeneration, unspecified eye, stage unspecified: Secondary | ICD-10-CM | POA: Diagnosis not present

## 2013-01-04 DIAGNOSIS — H353190 Nonexudative age-related macular degeneration, unspecified eye, stage unspecified: Secondary | ICD-10-CM | POA: Diagnosis not present

## 2013-01-11 DIAGNOSIS — IMO0002 Reserved for concepts with insufficient information to code with codable children: Secondary | ICD-10-CM | POA: Diagnosis not present

## 2013-01-11 DIAGNOSIS — M48061 Spinal stenosis, lumbar region without neurogenic claudication: Secondary | ICD-10-CM | POA: Diagnosis not present

## 2013-02-22 DIAGNOSIS — H353290 Exudative age-related macular degeneration, unspecified eye, stage unspecified: Secondary | ICD-10-CM | POA: Diagnosis not present

## 2013-02-22 DIAGNOSIS — H43819 Vitreous degeneration, unspecified eye: Secondary | ICD-10-CM | POA: Diagnosis not present

## 2013-02-22 DIAGNOSIS — H353190 Nonexudative age-related macular degeneration, unspecified eye, stage unspecified: Secondary | ICD-10-CM | POA: Diagnosis not present

## 2013-02-22 DIAGNOSIS — H2510 Age-related nuclear cataract, unspecified eye: Secondary | ICD-10-CM | POA: Diagnosis not present

## 2013-03-06 ENCOUNTER — Ambulatory Visit (INDEPENDENT_AMBULATORY_CARE_PROVIDER_SITE_OTHER): Payer: Medicare Other | Admitting: Internal Medicine

## 2013-03-06 ENCOUNTER — Encounter: Payer: Self-pay | Admitting: Internal Medicine

## 2013-03-06 VITALS — BP 130/70 | HR 68 | Temp 98.0°F | Resp 16 | Ht 65.0 in | Wt 150.0 lb

## 2013-03-06 DIAGNOSIS — I4892 Unspecified atrial flutter: Secondary | ICD-10-CM

## 2013-03-06 DIAGNOSIS — R238 Other skin changes: Secondary | ICD-10-CM

## 2013-03-06 DIAGNOSIS — R233 Spontaneous ecchymoses: Secondary | ICD-10-CM

## 2013-03-06 DIAGNOSIS — J441 Chronic obstructive pulmonary disease with (acute) exacerbation: Secondary | ICD-10-CM

## 2013-03-06 DIAGNOSIS — K5900 Constipation, unspecified: Secondary | ICD-10-CM | POA: Insufficient documentation

## 2013-03-06 DIAGNOSIS — K5909 Other constipation: Secondary | ICD-10-CM

## 2013-03-06 DIAGNOSIS — F1011 Alcohol abuse, in remission: Secondary | ICD-10-CM

## 2013-03-06 MED ORDER — EZETIMIBE 10 MG PO TABS: ORAL_TABLET | ORAL | Status: DC

## 2013-03-06 NOTE — Progress Notes (Signed)
Subjective:    Patient ID: Ryan [name], male    DOB: [birth year], 67 y.o.   MRN: [id number]  HPI Still smoking Discussed the "e cig" Easy bruising discused AF and PAC's and ethanol use Moderate COPD     Review of Systems  Constitutional: Negative.  Negative for fever and fatigue.  HENT: Negative for hearing loss, congestion, neck pain and postnasal drip.   Eyes: Negative for discharge, redness and visual disturbance.  Respiratory: Positive for shortness of breath and wheezing. Negative for cough.   Cardiovascular: Negative for leg swelling.  Gastrointestinal: Negative for abdominal pain, constipation and abdominal distention.  Genitourinary: Negative for urgency and frequency.  Musculoskeletal: Negative for joint swelling and arthralgias.  Skin: Negative for color change and rash.  Neurological: Negative for weakness and light-headedness.  Hematological: Negative for adenopathy.  Psychiatric/Behavioral: Negative for behavioral problems.       Past Medical History  Diagnosis Date  . H/O: rheumatic fever   . Kidney stones   . GERD (gastroesophageal reflux disease)   . Heart murmur   . Anemia   . Hypertension   . Macular degeneration   . Hyperlipidemia   . History of colonoscopy   . Heart murmur   . COPD (chronic obstructive pulmonary disease)   . Atrial flutter 03/31/2012    converted spontaneously to sinus  . High risk medication use     on amiodarone since 03/31/2012  . CAD (coronary artery disease)     reportedly moderate CAD; managed medically    History   Social History  . Marital Status: Married    Spouse Name: N/A    Number of Children: N/A  . Years of Education: N/A   Occupational History  . retired    Social History Main Topics  . Smoking status: Current Every Day Smoker -- 0.50 packs/day    Types: Cigarettes  . Smokeless tobacco: Former Neurosurgeon  . Alcohol Use: No  . Drug Use: No  . Sexually Active: Yes   Other Topics Concern  . Not on  file   Social History Narrative  . No narrative on file    Past Surgical History  Procedure Laterality Date  . Cervical discectomy    . Cholecystectomy    . Mouth surgery    . Knee cartiledge    . Foot surgery    . Appendectomy    . Cardiac catheterization  04/22/2011    moderate left main and RCA stenosis not significant by FFR and IVUS on medical therapy    Family History  Problem Relation Age of Onset  . Liver disease    . Prostate cancer    . Coronary artery disease    . Heart disease Mother   . Diabetes Mother   . Cirrhosis Mother   . Heart disease Father   . Cancer Father     prostate  . Heart disease Sister   . Arthritis Sister   . Heart disease Brother     Allergies  Allergen Reactions  . Polymyxin B-Trimethoprim     Eye drops  . Atorvastatin     REACTION: unspecified  . Ciprofloxacin     REACTION: unspecified  . Codeine Phosphate     REACTION: unspecified  . Simvastatin     REACTION: unspecified    Current Outpatient Prescriptions on File Prior to Visit  Medication Sig Dispense Refill  . aspirin EC 81 MG EC tablet Take 81 mg by mouth daily.        Marland Kitchen  DULERA 100-5 MCG/ACT AERO INHALE 2 PUFFS INTO THE LUNGS 2 (TWO) TIMES DAILY.  13 g  6  . Multiple Vitamins-Minerals (ICAPS MV) TABS Take 1 tablet by mouth 2 (two) times daily.       . multivitamin (RENA-VIT) TABS tablet Take 1 tablet by mouth daily.    0  . omeprazole (PRILOSEC) 20 MG capsule Take 20 mg by mouth 2 (two) times daily.       . propranolol (INDERAL) 10 MG tablet AS NEEDED FOR PALPITATIONS  30 tablet  1   No current facility-administered medications on file prior to visit.    BP 130/70  Pulse 68  Temp(Src) 98 F (36.7 C)  Resp 16  Ht 5\' 5"  (1.651 m)  Wt 150 lb (68.04 kg)  BMI 24.96 kg/m2    Objective:   Physical Exam  Nursing note and vitals reviewed. Constitutional: He appears well-developed and well-nourished.  HENT:  Head: Normocephalic and atraumatic.  Eyes: Conjunctivae  are normal. Pupils are equal, round, and reactive to light.  Neck: Normal range of motion. Neck supple.  Cardiovascular: Normal rate and regular rhythm.   Murmur heard. Pulmonary/Chest: Effort normal and breath sounds normal.  Abdominal: Soft. Bowel sounds are normal.  Skin:  bruising          Assessment & Plan:  COPD  Mild flair Lipid management Back pain stable Constipation stable on amitiza Heart stable recommend to stay off ETOH check PFTS

## 2013-03-06 NOTE — Patient Instructions (Signed)
The patient is instructed to continue all medications as prescribed. Schedule followup with check out clerk upon leaving the clinic  

## 2013-03-10 ENCOUNTER — Ambulatory Visit (INDEPENDENT_AMBULATORY_CARE_PROVIDER_SITE_OTHER)
Admission: RE | Admit: 2013-03-10 | Discharge: 2013-03-10 | Disposition: A | Discharge status: Home / Self Care | Payer: Medicare Other | Source: Ambulatory Visit | Attending: Internal Medicine | Admitting: Internal Medicine

## 2013-03-10 ENCOUNTER — Ambulatory Visit (INDEPENDENT_AMBULATORY_CARE_PROVIDER_SITE_OTHER): Payer: Medicare Other | Admitting: Internal Medicine

## 2013-03-10 ENCOUNTER — Encounter: Payer: Self-pay | Admitting: Internal Medicine

## 2013-03-10 VITALS — BP 118/60 | HR 66 | Temp 97.9°F | Ht 64.25 in | Wt 150.0 lb

## 2013-03-10 DIAGNOSIS — J449 Chronic obstructive pulmonary disease, unspecified: Secondary | ICD-10-CM

## 2013-03-10 DIAGNOSIS — F17200 Nicotine dependence, unspecified, uncomplicated: Secondary | ICD-10-CM | POA: Diagnosis not present

## 2013-03-10 DIAGNOSIS — J8410 Pulmonary fibrosis, unspecified: Secondary | ICD-10-CM | POA: Diagnosis not present

## 2013-03-10 NOTE — Progress Notes (Signed)
Quick Note:  Spoke with pt and notified of results per Dr. Wert. Pt verbalized understanding and denied any questions.  ______ 

## 2013-03-10 NOTE — Progress Notes (Signed)
  Subjective:    Patient ID: Ryan [name], male    DOB: 1946/09/08   MRN: [id number]  HPI  83 yowm smoker with am cough x decades dx as copd 2000 by Dr Lovell Sheehan who referred 03/10/2013 to pulmonary clinic.  03/10/2013 1st pulmonary eval cc am cough productive x decades better at [place] clear tsp of clear mucus which takes him longer to bring up still can do in < 10 min,  Wife concerned, assoc with mod hypersomnolence.   Not limited from desired activities using dulera prn once or twice a week for "funny feeling in chest" that gets better within a minute not necessarily related to activities  No obvious daytime variabilty or assoc  cp or chest tightness, subjective wheeze overt sinus or hb symptoms. No unusual exp hx or h/o childhood pna/ asthma or premature birth to his  knowledge.   Sleeping ok without nocturnal  or early am exacerbation  of respiratory  c/o's or need for noct saba. Also denies any obvious fluctuation of symptoms with weather or environmental changes or other aggravating or alleviating factors except as outlined above    Review of Systems  Constitutional: Positive for unexpected weight change. Negative for fever, chills, activity change and appetite change.  HENT: Positive for dental problem. Negative for congestion, sore throat, rhinorrhea, sneezing, trouble swallowing, voice change and postnasal drip.   Eyes: Negative for visual disturbance.  Respiratory: Positive for cough. Negative for choking and shortness of breath.   Cardiovascular: Negative for chest pain and leg swelling.  Gastrointestinal: Negative for nausea, vomiting and abdominal pain.  Genitourinary: Negative for difficulty urinating.       Acid Heartburn  Musculoskeletal: Negative for arthralgias.  Skin: Negative for rash.  Psychiatric/Behavioral: Negative for behavioral problems and confusion.       Objective:   Physical Exam Wt Readings from Last 3 Encounters:  03/10/13 150 lb (68.04 kg)  03/06/13  150 lb (68.04 kg)  11/07/12 154 lb (69.854 kg)    HEENT mild turbinate edema.  Oropharynx no thrush or excess pnd or cobblestoning.  No JVD or cervical adenopathy. Mild accessory muscle hypertrophy. Trachea midline, nl thryroid. Chest was slt  hyperinflated by percussion with diminished breath sounds and mild increased exp time without wheeze. [name] sign positive at end  inspiration. Regular rate and rhythm without murmur gallop or rub or increase P2 or edema.  Abd: no hsm, nl excursion. Ext warm without cyanosis or clubbing.       CXR  03/10/2013 :   Generalized hyperinflation configuration consistent with obstructive pulmonary disease. Minimal central peribronchial thickening. Right basilar fibrotic changes. No consolidation or pleural effusion.       Assessment & Plan:

## 2013-03-10 NOTE — Patient Instructions (Addendum)
The key is to stop smoking completely before smoking completely stops you!   Please remember to go to the  x-ray department downstairs for your tests - we will call you with the results when they are available.  Please schedule a follow up office visit in 6 weeks, call sooner if needed with pfts with no dulera that day

## 2013-03-12 NOTE — Assessment & Plan Note (Signed)

## 2013-03-12 NOTE — Assessment & Plan Note (Signed)
He probably has mild/ moderate severity copd from smoking (discussed separately)  He has a typical smoker's cough but not presently limited from desired activities using prn dulera.    Since the natural hx of copd is not affected by meds, what he really needs to do for now is focus on smoking cessation and return for baseline pft's

## 2013-04-04 ENCOUNTER — Ambulatory Visit (INDEPENDENT_AMBULATORY_CARE_PROVIDER_SITE_OTHER): Payer: Medicare Other | Admitting: Internal Medicine

## 2013-04-04 ENCOUNTER — Encounter: Payer: Self-pay | Admitting: Internal Medicine

## 2013-04-04 VITALS — BP 124/64 | HR 62 | Ht 64.25 in | Wt 150.6 lb

## 2013-04-04 DIAGNOSIS — I10 Essential (primary) hypertension: Secondary | ICD-10-CM | POA: Diagnosis not present

## 2013-04-04 DIAGNOSIS — I4892 Unspecified atrial flutter: Secondary | ICD-10-CM | POA: Diagnosis not present

## 2013-04-04 NOTE — Assessment & Plan Note (Signed)
His symptoms of atrial fibrillation and atrial flutter are now well-controlled.we discussed the importance of not drinking alcohol in excess or caffeine. He will continue his current medical therapy. He is off of amiodarone. I'll see him back on an as-needed basis.

## 2013-04-04 NOTE — Assessment & Plan Note (Signed)
His blood pressure is well controlled. No change in medical therapy. 

## 2013-04-04 NOTE — Progress Notes (Signed)
HPI Mr. Dooly returns today for followup. He is a very pleasant 67 year old man with a history of atrial fibrillation, and was initially placed on amiodarone. He became intolerant of this medication and it was discontinued approximately 8 months ago. In the interim, he has done well. He denies chest pain, shortness of breath, or palpitations. He has had no symptomatic atrial fibrillation. He denies syncope or peripheral edema.  Allergies  Allergen Reactions  . Polymyxin B-Trimethoprim     Eye drops  . Atorvastatin     REACTION: unspecified  . Ciprofloxacin     REACTION: unspecified  . Codeine Phosphate     REACTION: unspecified  . Simvastatin     REACTION: unspecified     Current Outpatient Prescriptions  Medication Sig Dispense Refill  . aspirin EC 81 MG EC tablet Take 81 mg by mouth daily.        Marland Kitchen ezetimibe (ZETIA) 10 MG tablet Take 1 tablet by mouth  daily  90 tablet  3  . lubiprostone (AMITIZA) 24 MCG capsule Take 24 mcg by mouth daily with breakfast.      . mometasone-formoterol (DULERA) 100-5 MCG/ACT AERO       . Multiple Vitamins-Minerals (ICAPS MV) TABS Take 1 tablet by mouth 2 (two) times daily.       . multivitamin (RENA-VIT) TABS tablet Take 1 tablet by mouth daily.    0  . omeprazole (PRILOSEC) 20 MG capsule Take 20 mg by mouth 2 (two) times daily.        No current facility-administered medications for this visit.     Past Medical History  Diagnosis Date  . H/O: rheumatic fever   . Kidney stones   . GERD (gastroesophageal reflux disease)   . Heart murmur   . Anemia   . Hypertension   . Macular degeneration   . Hyperlipidemia   . History of colonoscopy   . Heart murmur   . COPD (chronic obstructive pulmonary disease)   . Atrial flutter 03/31/2012    converted spontaneously to sinus  . High risk medication use     on amiodarone since 03/31/2012  . CAD (coronary artery disease)     reportedly moderate CAD; managed medically    ROS:   All systems  reviewed and negative except as noted in the HPI.   Past Surgical History  Procedure Laterality Date  . Cervical discectomy    . Cholecystectomy    . Mouth surgery    . Knee cartiledge    . Foot surgery    . Appendectomy    . Cardiac catheterization  04/22/2011    moderate left main and RCA stenosis not significant by FFR and IVUS on medical therapy     Family History  Problem Relation Age of Onset  . Liver disease    . Prostate cancer    . Coronary artery disease    . Heart disease Mother   . Diabetes Mother   . Cirrhosis Mother   . Heart disease Father   . Cancer Father     prostate  . Heart disease Sister   . Arthritis Sister   . Heart disease Brother   . Emphysema Mother     never smoked but 2nd hand through her spouse     History   Social History  . Marital Status: Married    Spouse Name: N/A    Number of Children: 0  . Years of Education: N/A   Occupational History  . retired  Social History Main Topics  . Smoking status: Current Every Day Smoker -- 0.50 packs/day for 50 years    Types: Cigarettes  . Smokeless tobacco: Former Neurosurgeon  . Alcohol Use: No  . Drug Use: No  . Sexually Active: Yes   Other Topics Concern  . Not on file   Social History Narrative  . No narrative on file     BP 124/64  Pulse 62  Ht 5' 4.25" (1.632 m)  Wt 150 lb 9.6 oz (68.312 kg)  BMI 25.65 kg/m2  Physical Exam:  Well appearing 66 year old man,NAD HEENT: Unremarkable Neck:  6 cm JVD, no thyromegally Lungs:  Clear with no wheezes, rales, or rhonchi. HEART:  Regular rate rhythm, no murmurs, no rubs, no clicks Abd:  soft, positive bowel sounds, no organomegally, no rebound, no guarding Ext:  2 plus pulses, no edema, no cyanosis, no clubbing Skin:  No rashes no nodules Neuro:  CN II through XII intact, motor grossly intact  EKG - normal sinus rhythm with PVCs in   Assess/Plan:

## 2013-04-04 NOTE — Patient Instructions (Signed)
Your physician recommends that you schedule a follow-up appointment as needed  

## 2013-04-24 ENCOUNTER — Ambulatory Visit: Payer: Medicare Other | Admitting: Internal Medicine

## 2013-04-28 DIAGNOSIS — IMO0002 Reserved for concepts with insufficient information to code with codable children: Secondary | ICD-10-CM | POA: Diagnosis not present

## 2013-04-28 DIAGNOSIS — M48061 Spinal stenosis, lumbar region without neurogenic claudication: Secondary | ICD-10-CM | POA: Diagnosis not present

## 2013-05-02 DIAGNOSIS — K5900 Constipation, unspecified: Secondary | ICD-10-CM | POA: Diagnosis not present

## 2013-05-10 ENCOUNTER — Other Ambulatory Visit: Payer: Self-pay

## 2013-05-22 ENCOUNTER — Encounter: Payer: Self-pay | Admitting: Internal Medicine

## 2013-05-22 ENCOUNTER — Ambulatory Visit (INDEPENDENT_AMBULATORY_CARE_PROVIDER_SITE_OTHER): Payer: Medicare Other | Admitting: Internal Medicine

## 2013-05-22 VITALS — BP 110/58 | HR 71 | Temp 97.1°F | Ht 65.0 in | Wt 155.0 lb

## 2013-05-22 DIAGNOSIS — R05 Cough: Secondary | ICD-10-CM

## 2013-05-22 DIAGNOSIS — R059 Cough, unspecified: Secondary | ICD-10-CM | POA: Diagnosis not present

## 2013-05-22 DIAGNOSIS — F17200 Nicotine dependence, unspecified, uncomplicated: Secondary | ICD-10-CM

## 2013-05-22 DIAGNOSIS — J449 Chronic obstructive pulmonary disease, unspecified: Secondary | ICD-10-CM

## 2013-05-22 LAB — PULMONARY FUNCTION TEST

## 2013-05-22 NOTE — Progress Notes (Signed)
PFT done today. 

## 2013-05-22 NOTE — Assessment & Plan Note (Addendum)
-   pft's 05/22/2013 FEV1  3.11 (112%) ratio 69 and no change p saba,  dlco 60 corrects to 58   He really does not have significant copd and unless resumes smoking, per the Fletcher curve, it is very unlikely that he ever will.    Each maintenance medication was reviewed in detail including most importantly the difference between maintenance and as needed and under what circumstances the prns are to be used.  Please see instructions for details which were reviewed in writing and the patient given a copy.    Pulmonary f/u is prn

## 2013-05-22 NOTE — Patient Instructions (Addendum)
You do not have significant copd and you never will unless you resume smoking - congratulations so far!   If you are satisfied with your treatment plan let your doctor know and he/she can either refill your medications or you can return here when your prescription runs out.     If in any way you are not 100% satisfied,  please tell us.  If 100% better, tell your friends!

## 2013-05-22 NOTE — Assessment & Plan Note (Signed)
Congratulated and reinforced impt of maintaining complete smoking abstinence

## 2013-05-22 NOTE — Progress Notes (Signed)
  Subjective:    Patient ID: Ryan [name], male    DOB: 02-12-1946   MRN: [id number]    Brief patient profile:  5 yowm smoker with am cough x decades dx as copd 2000 by Ryan [name] who referred 03/10/2013 to pulmonary clinic with nl pft's p quit smoking 05/22/2013   03/10/2013 1st pulmonary eval cc am cough productive x [place] clear tsp of clear mucus which takes him longer to bring up still can do in < 10 min,  Wife concerned, assoc with mod hypersomnolence.  rec The key is to stop smoking completely before smoking completely stops you!          05/22/2013 f/u ov/Ryan [name] stopped smoking > here for pfts  Chief Complaint  Patient presents with  . Follow-up    PFT done-- breathing doing well-- denies any concerns at this time    No cough or sob   Not limited from desired activities not using dulera prn   No obvious daytime variabilty or assoc  cp or chest tightness, subjective wheeze overt sinus or hb symptoms. No unusual exp hx or h/o childhood pna/ asthma or premature birth to his  knowledge.   Sleeping ok without nocturnal  or early am exacerbation  of respiratory  c/o's or need for noct saba. Also denies any obvious fluctuation of symptoms with weather or environmental changes or other aggravating or alleviating factors except as outlined above   Current Medications, Allergies, Past Medical History, Past Surgical History, Family History, and Social History were reviewed in [name] record.  ROS  The following are not active complaints unless bolded sore throat, dysphagia, dental problems, itching, sneezing,  nasal congestion or excess/ purulent secretions, ear ache,   fever, chills, sweats, unintended wt loss, pleuritic or exertional cp, hemoptysis,  orthopnea pnd or leg swelling, presyncope, palpitations, heartburn, abdominal pain, anorexia, nausea, vomiting, diarrhea  or change in bowel or urinary habits, change in stools or urine,  dysuria,hematuria,  rash, arthralgias, visual complaints, headache, numbness weakness or ataxia or problems with walking or coordination,  change in mood/affect or memory.            Objective:   Physical Exam  05/22/2013       155  Wt Readings from Last 3 Encounters:  03/10/13 150 lb (68.04 kg)  03/06/13 150 lb (68.04 kg)  11/07/12 154 lb (69.854 kg)     HEENT: nl dentition, turbinates, and orophanx. Nl external ear canals without cough reflex   NECK :  without JVD/Nodes/TM/ nl carotid upstrokes bilaterally   LUNGS: no acc muscle use, clear to A and P bilaterally without cough on insp or exp maneuvers   CV:  RRR  no s3 or murmur or increase in P2, no edema   ABD:  soft and nontender with nl excursion in the supine position. No bruits or organomegaly, bowel sounds nl  MS:  warm without deformities, calf tenderness, cyanosis or clubbing  SKIN: warm and dry without lesions    NEURO:  alert, approp, no deficits        CXR  03/10/2013 :   Generalized hyperinflation configuration consistent with obstructive pulmonary disease. Minimal central peribronchial thickening. Right basilar fibrotic changes. No consolidation or pleural effusion.       Assessment & Plan:

## 2013-05-24 DIAGNOSIS — H43819 Vitreous degeneration, unspecified eye: Secondary | ICD-10-CM | POA: Diagnosis not present

## 2013-05-24 DIAGNOSIS — H353290 Exudative age-related macular degeneration, unspecified eye, stage unspecified: Secondary | ICD-10-CM | POA: Diagnosis not present

## 2013-05-24 DIAGNOSIS — H353190 Nonexudative age-related macular degeneration, unspecified eye, stage unspecified: Secondary | ICD-10-CM | POA: Diagnosis not present

## 2013-05-24 DIAGNOSIS — H2510 Age-related nuclear cataract, unspecified eye: Secondary | ICD-10-CM | POA: Diagnosis not present

## 2013-05-31 DIAGNOSIS — IMO0002 Reserved for concepts with insufficient information to code with codable children: Secondary | ICD-10-CM | POA: Diagnosis not present

## 2013-05-31 DIAGNOSIS — M48061 Spinal stenosis, lumbar region without neurogenic claudication: Secondary | ICD-10-CM | POA: Diagnosis not present

## 2013-06-06 ENCOUNTER — Encounter: Payer: Self-pay | Admitting: Internal Medicine

## 2013-06-08 DIAGNOSIS — B029 Zoster without complications: Secondary | ICD-10-CM | POA: Diagnosis not present

## 2013-06-08 DIAGNOSIS — L299 Pruritus, unspecified: Secondary | ICD-10-CM | POA: Diagnosis not present

## 2013-06-08 DIAGNOSIS — L259 Unspecified contact dermatitis, unspecified cause: Secondary | ICD-10-CM | POA: Diagnosis not present

## 2013-06-23 DIAGNOSIS — Z23 Encounter for immunization: Secondary | ICD-10-CM | POA: Diagnosis not present

## 2013-06-26 ENCOUNTER — Ambulatory Visit (INDEPENDENT_AMBULATORY_CARE_PROVIDER_SITE_OTHER): Payer: Medicare Other | Admitting: Internal Medicine

## 2013-06-26 ENCOUNTER — Encounter: Payer: Self-pay | Admitting: Internal Medicine

## 2013-06-26 VITALS — BP 136/70 | HR 72 | Temp 98.6°F | Resp 16 | Ht 65.0 in | Wt 153.0 lb

## 2013-06-26 DIAGNOSIS — J441 Chronic obstructive pulmonary disease with (acute) exacerbation: Secondary | ICD-10-CM

## 2013-06-26 DIAGNOSIS — L52 Erythema nodosum: Secondary | ICD-10-CM | POA: Diagnosis not present

## 2013-06-26 DIAGNOSIS — I10 Essential (primary) hypertension: Secondary | ICD-10-CM

## 2013-06-26 NOTE — Progress Notes (Signed)
Subjective:    Patient ID: Ryan [name], male    DOB: [birth year], 67 y.o.   MRN: [id number]  HPI Follow up for CAD, HTN [place] and had multiple bites with black flies. Took prednisone and acyclovir 200 mg 5 x a day for 7 days for "shingles" No post herpetic neuralgia? Mild fatigue off predisone Post the infection better energy  Next vist will need the pneumonia shot     Review of Systems  Constitutional: Negative for fever and fatigue.  HENT: Negative for hearing loss, congestion, neck pain and postnasal drip.   Eyes: Negative for discharge, redness and visual disturbance.  Respiratory: Negative for cough, shortness of breath and wheezing.   Cardiovascular: Negative for leg swelling.  Gastrointestinal: Negative for abdominal pain, constipation and abdominal distention.  Genitourinary: Negative for urgency and frequency.  Musculoskeletal: Negative for joint swelling and arthralgias.  Skin: Negative for color change and rash.  Neurological: Negative for weakness and light-headedness.  Hematological: Negative for adenopathy.  Psychiatric/Behavioral: Negative for behavioral problems.   Past Medical History  Diagnosis Date  . H/O: rheumatic fever   . Kidney stones   . GERD (gastroesophageal reflux disease)   . Heart murmur   . Anemia   . Hypertension   . Macular degeneration   . Hyperlipidemia   . History of colonoscopy   . Heart murmur   . COPD (chronic obstructive pulmonary disease)   . Atrial flutter 03/31/2012    converted spontaneously to sinus  . High risk medication use     on amiodarone since 03/31/2012  . CAD (coronary artery disease)     reportedly moderate CAD; managed medically    History   Social History  . Marital Status: Married    Spouse Name: N/A    Number of Children: 0  . Years of Education: N/A   Occupational History  . retired    Social History Main Topics  . Smoking status: Former Smoker -- 0.50 packs/day for 50 years     Types: Cigarettes    Quit date: 05/01/2013  . Smokeless tobacco: Former Neurosurgeon     Comment: using e-Cig//ldc  . Alcohol Use: No  . Drug Use: No  . Sexual Activity: Yes   Other Topics Concern  . Not on file   Social History Narrative  . No narrative on file    Past Surgical History  Procedure Laterality Date  . Cervical discectomy    . Cholecystectomy    . Mouth surgery    . Knee cartiledge    . Foot surgery    . Appendectomy    . Cardiac catheterization  04/22/2011    moderate left main and RCA stenosis not significant by FFR and IVUS on medical therapy    Family History  Problem Relation Age of Onset  . Liver disease    . Prostate cancer    . Coronary artery disease    . Heart disease Mother   . Diabetes Mother   . Cirrhosis Mother   . Heart disease Father   . Cancer Father     prostate  . Heart disease Sister   . Arthritis Sister   . Heart disease Brother   . Emphysema Mother     never smoked but 2nd hand through her spouse    Allergies  Allergen Reactions  . Polymyxin B-Trimethoprim     Eye drops  . Atorvastatin     REACTION: unspecified  . Ciprofloxacin  REACTION: unspecified  . Codeine Phosphate     REACTION: unspecified  . Simvastatin     REACTION: unspecified    Current Outpatient Prescriptions on File Prior to Visit  Medication Sig Dispense Refill  . aspirin EC 81 MG EC tablet Take 81 mg by mouth daily.        Marland Kitchen ezetimibe (ZETIA) 10 MG tablet Take 1 tablet by mouth  daily  90 tablet  3  . Multiple Vitamins-Minerals (ICAPS MV) TABS Take 1 tablet by mouth 2 (two) times daily.       . multivitamin (RENA-VIT) TABS tablet Take 1 tablet by mouth daily.    0  . omeprazole (PRILOSEC) 20 MG capsule Take 20 mg by mouth daily.        No current facility-administered medications on file prior to visit.    BP 136/70  Pulse 72  Temp(Src) 98.6 F (37 C)  Resp 16  Ht 5\' 5"  (1.651 m)  Wt 153 lb (69.4 kg)  BMI 25.46 kg/m2       Objective:    Physical Exam  Nursing note and vitals reviewed. Constitutional: He appears well-developed and well-nourished.  HENT:  Head: Normocephalic and atraumatic.  Eyes: Conjunctivae are normal. Pupils are equal, round, and reactive to light.  Neck: Normal range of motion. Neck supple.  Cardiovascular: Normal rate and regular rhythm.   Pulmonary/Chest: Effort normal and breath sounds normal.  Abdominal: Soft. Bowel sounds are normal.          Assessment & Plan:  Possible shingles? 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 Possible reaction? COPD history better the pulmonary testing after six weeks  HTN stable

## 2013-06-28 ENCOUNTER — Ambulatory Visit: Payer: Medicare Other | Admitting: Internal Medicine

## 2013-07-21 DIAGNOSIS — T1500XA Foreign body in cornea, unspecified eye, initial encounter: Secondary | ICD-10-CM | POA: Diagnosis not present

## 2013-07-24 DIAGNOSIS — H179 Unspecified corneal scar and opacity: Secondary | ICD-10-CM | POA: Diagnosis not present

## 2013-07-24 DIAGNOSIS — S058X9A Other injuries of unspecified eye and orbit, initial encounter: Secondary | ICD-10-CM | POA: Diagnosis not present

## 2013-08-23 DIAGNOSIS — H353190 Nonexudative age-related macular degeneration, unspecified eye, stage unspecified: Secondary | ICD-10-CM | POA: Diagnosis not present

## 2013-08-23 DIAGNOSIS — H2510 Age-related nuclear cataract, unspecified eye: Secondary | ICD-10-CM | POA: Diagnosis not present

## 2013-08-23 DIAGNOSIS — H353290 Exudative age-related macular degeneration, unspecified eye, stage unspecified: Secondary | ICD-10-CM | POA: Diagnosis not present

## 2013-08-23 DIAGNOSIS — H43819 Vitreous degeneration, unspecified eye: Secondary | ICD-10-CM | POA: Diagnosis not present

## 2013-09-04 ENCOUNTER — Other Ambulatory Visit: Payer: Self-pay | Admitting: Internal Medicine

## 2013-10-18 DIAGNOSIS — M48061 Spinal stenosis, lumbar region without neurogenic claudication: Secondary | ICD-10-CM | POA: Diagnosis not present

## 2013-10-18 DIAGNOSIS — IMO0002 Reserved for concepts with insufficient information to code with codable children: Secondary | ICD-10-CM | POA: Diagnosis not present

## 2013-11-01 DIAGNOSIS — H43819 Vitreous degeneration, unspecified eye: Secondary | ICD-10-CM | POA: Diagnosis not present

## 2013-11-01 DIAGNOSIS — H2510 Age-related nuclear cataract, unspecified eye: Secondary | ICD-10-CM | POA: Diagnosis not present

## 2013-11-01 DIAGNOSIS — H353290 Exudative age-related macular degeneration, unspecified eye, stage unspecified: Secondary | ICD-10-CM | POA: Diagnosis not present

## 2013-11-01 DIAGNOSIS — H353190 Nonexudative age-related macular degeneration, unspecified eye, stage unspecified: Secondary | ICD-10-CM | POA: Diagnosis not present

## 2013-11-27 ENCOUNTER — Encounter: Payer: Medicare Other | Admitting: Internal Medicine

## 2013-12-01 ENCOUNTER — Encounter: Payer: Self-pay | Admitting: Internal Medicine

## 2013-12-01 ENCOUNTER — Ambulatory Visit (INDEPENDENT_AMBULATORY_CARE_PROVIDER_SITE_OTHER): Payer: Medicare Other | Admitting: Internal Medicine

## 2013-12-01 ENCOUNTER — Other Ambulatory Visit: Payer: Self-pay | Admitting: *Deleted

## 2013-12-01 VITALS — BP 138/80 | HR 72 | Temp 97.9°F | Resp 16 | Ht 65.0 in | Wt 158.0 lb

## 2013-12-01 DIAGNOSIS — J449 Chronic obstructive pulmonary disease, unspecified: Secondary | ICD-10-CM

## 2013-12-01 DIAGNOSIS — E785 Hyperlipidemia, unspecified: Secondary | ICD-10-CM

## 2013-12-01 DIAGNOSIS — N138 Other obstructive and reflux uropathy: Secondary | ICD-10-CM

## 2013-12-01 DIAGNOSIS — Z0000 Encounter for general adult medical examination without abnormal findings: Secondary | ICD-10-CM

## 2013-12-01 DIAGNOSIS — T887XXA Unspecified adverse effect of drug or medicament, initial encounter: Secondary | ICD-10-CM | POA: Diagnosis not present

## 2013-12-01 DIAGNOSIS — R059 Cough, unspecified: Secondary | ICD-10-CM | POA: Diagnosis not present

## 2013-12-01 DIAGNOSIS — N401 Enlarged prostate with lower urinary tract symptoms: Secondary | ICD-10-CM | POA: Diagnosis not present

## 2013-12-01 DIAGNOSIS — F528 Other sexual dysfunction not due to a substance or known physiological condition: Secondary | ICD-10-CM | POA: Diagnosis not present

## 2013-12-01 DIAGNOSIS — R05 Cough: Secondary | ICD-10-CM

## 2013-12-01 DIAGNOSIS — N139 Obstructive and reflux uropathy, unspecified: Secondary | ICD-10-CM

## 2013-12-01 DIAGNOSIS — K5900 Constipation, unspecified: Secondary | ICD-10-CM | POA: Diagnosis not present

## 2013-12-01 DIAGNOSIS — Z23 Encounter for immunization: Secondary | ICD-10-CM

## 2013-12-01 DIAGNOSIS — F5221 Male erectile disorder: Secondary | ICD-10-CM

## 2013-12-01 DIAGNOSIS — I10 Essential (primary) hypertension: Secondary | ICD-10-CM

## 2013-12-01 LAB — POCT URINALYSIS DIPSTICK
Bilirubin, UA: NEGATIVE
Blood, UA: NEGATIVE
Glucose, UA: NEGATIVE
Ketones, UA: NEGATIVE
Leukocytes, UA: NEGATIVE
Nitrite, UA: NEGATIVE
Protein, UA: NEGATIVE
Spec Grav, UA: 1.015
Urobilinogen, UA: 0.2
pH, UA: 7

## 2013-12-01 LAB — CBC WITH DIFFERENTIAL/PLATELET
Basophils Absolute: 0 10*3/uL (ref 0.0–0.1)
Basophils Relative: 0.2 % (ref 0.0–3.0)
Eosinophils Absolute: 0 10*3/uL (ref 0.0–0.7)
Eosinophils Relative: 0.2 % (ref 0.0–5.0)
HCT: 44.8 % (ref 39.0–52.0)
Hemoglobin: 14.9 g/dL (ref 13.0–17.0)
Lymphocytes Relative: 23.4 % (ref 12.0–46.0)
Lymphs Abs: 2 10*3/uL (ref 0.7–4.0)
MCHC: 33.2 g/dL (ref 30.0–36.0)
MCV: 93.7 fl (ref 78.0–100.0)
Monocytes Absolute: 0.6 10*3/uL (ref 0.1–1.0)
Monocytes Relative: 7.7 % (ref 3.0–12.0)
Neutro Abs: 5.8 10*3/uL (ref 1.4–7.7)
Neutrophils Relative %: 68.5 % (ref 43.0–77.0)
Platelets: 205 10*3/uL (ref 150.0–400.0)
RBC: 4.78 Mil/uL (ref 4.22–5.81)
RDW: 12.8 % (ref 11.5–14.6)
WBC: 8.4 10*3/uL (ref 4.5–10.5)

## 2013-12-01 LAB — BASIC METABOLIC PANEL
BUN: 12 mg/dL (ref 6–23)
CO2: 30 mEq/L (ref 19–32)
Calcium: 10.1 mg/dL (ref 8.4–10.5)
Chloride: 102 mEq/L (ref 96–112)
Creatinine, Ser: 1 mg/dL (ref 0.4–1.5)
GFR: 79.25 mL/min (ref >60.00)
Glucose, Bld: 91 mg/dL (ref 70–99)
Potassium: 5.6 mEq/L — ABNORMAL HIGH (ref 3.5–5.1)
Sodium: 139 mEq/L (ref 135–145)

## 2013-12-01 LAB — HEPATIC FUNCTION PANEL
ALT: 18 U/L (ref 0–53)
AST: 22 U/L (ref 0–37)
Albumin: 4.1 g/dL (ref 3.5–5.2)
Alkaline Phosphatase: 58 U/L (ref 39–117)
Bilirubin, Direct: 0.1 mg/dL (ref 0.0–0.3)
Total Bilirubin: 1 mg/dL (ref 0.3–1.2)
Total Protein: 7.3 g/dL (ref 6.0–8.3)

## 2013-12-01 LAB — LIPID PANEL
Cholesterol: 204 mg/dL — ABNORMAL HIGH (ref 0–200)
HDL: 51.5 mg/dL (ref >39.00)
Total CHOL/HDL Ratio: 4
Triglycerides: 61 mg/dL (ref 0.0–149.0)
VLDL: 12.2 mg/dL (ref 0.0–40.0)

## 2013-12-01 LAB — TESTOSTERONE: Testosterone: 585.58 ng/dL (ref 350.00–890.00)

## 2013-12-01 LAB — TSH: TSH: 1.35 u[IU]/mL (ref 0.35–5.50)

## 2013-12-01 LAB — LDL CHOLESTEROL, DIRECT: Direct LDL: 134.2 mg/dL

## 2013-12-01 LAB — PSA: PSA: 1.37 ng/mL (ref 0.10–4.00)

## 2013-12-01 MED ORDER — LUBIPROSTONE 8 MCG PO CAPS: 16.0000 ug | ORAL_CAPSULE | Freq: Every day | ORAL | Status: DC

## 2013-12-01 MED ORDER — AZITHROMYCIN 250 MG PO TABS: ORAL_TABLET | ORAL | Status: DC

## 2013-12-01 NOTE — Progress Notes (Signed)
Pre visit review using our clinic review tool, if applicable. No additional management support is needed unless otherwise documented below in the visit note. 

## 2013-12-01 NOTE — Patient Instructions (Signed)
The patient is instructed to continue all medications as prescribed. Schedule followup with check out clerk upon leaving the clinic  

## 2013-12-01 NOTE — Progress Notes (Signed)
Subjective:    Patient ID: Ryan [name], male    DOB: [birth year], 68 y.o.   MRN: [id number]  HPI Followed for lipid management. Constipations and GERD with chronic cough and hiccup Constipation the patient noted watery stools on 24 amitiza He is on amitiza 8 and has episodic constipation  Last colon internal hemorrhoids.  Due colon in July     Review of Systems  Constitutional: Negative for fever and fatigue.  HENT: Positive for rhinorrhea, sinus pressure and sneezing. Negative for congestion, hearing loss and postnasal drip.   Eyes: Negative for discharge, redness and visual disturbance.  Respiratory: Negative for shortness of breath and wheezing.   Cardiovascular: Negative for leg swelling.  Gastrointestinal: Positive for blood in stool and anal bleeding. Negative for abdominal pain, constipation and abdominal distention.  Genitourinary: Negative for urgency and frequency.  Musculoskeletal: Negative for arthralgias, joint swelling and neck pain.  Skin: Negative for color change and rash.  Neurological: Negative for weakness and light-headedness.  Hematological: Negative for adenopathy.  Psychiatric/Behavioral: Negative for behavioral problems.   Past Medical History  Diagnosis Date  . H/O: rheumatic fever   . Kidney stones   . GERD (gastroesophageal reflux disease)   . Heart murmur   . Anemia   . Hypertension   . Macular degeneration   . Hyperlipidemia   . History of colonoscopy   . Heart murmur   . COPD (chronic obstructive pulmonary disease)   . Atrial flutter 03/31/2012    converted spontaneously to sinus  . High risk medication use     on amiodarone since 03/31/2012  . CAD (coronary artery disease)     reportedly moderate CAD; managed medically    History   Social History  . Marital Status: Married    Spouse Name: N/A    Number of Children: 0  . Years of Education: N/A   Occupational History  . retired    Social History Main Topics  . Smoking  status: Former Smoker -- 0.50 packs/day for 50 years    Types: Cigarettes    Quit date: 05/01/2013  . Smokeless tobacco: Former Systems developer     Comment: using e-Cig//ldc  . Alcohol Use: No  . Drug Use: No  . Sexual Activity: Yes   Other Topics Concern  . Not on file   Social History Narrative  . No narrative on file    Past Surgical History  Procedure Laterality Date  . Cervical discectomy    . Cholecystectomy    . Mouth surgery    . Knee cartiledge    . Foot surgery    . Appendectomy    . Cardiac catheterization  04/22/2011    moderate left main and RCA stenosis not significant by FFR and IVUS on medical therapy    Family History  Problem Relation Age of Onset  . Liver disease    . Prostate cancer    . Coronary artery disease    . Heart disease Mother   . Diabetes Mother   . Cirrhosis Mother   . Heart disease Father   . Cancer Father     prostate  . Heart disease Sister   . Arthritis Sister   . Heart disease Brother   . Emphysema Mother     never smoked but 2nd hand through her spouse    Allergies  Allergen Reactions  . Polymyxin B-Trimethoprim     Eye drops  . Atorvastatin     REACTION: unspecified  . Ciprofloxacin  REACTION: unspecified  . Codeine Phosphate     REACTION: unspecified  . Simvastatin     REACTION: unspecified    Current Outpatient Prescriptions on File Prior to Visit  Medication Sig Dispense Refill  . aspirin EC 81 MG EC tablet Take 81 mg by mouth daily.        Marland Kitchen ezetimibe (ZETIA) 10 MG tablet Take 1 tablet by mouth  daily  90 tablet  3  . lubiprostone (AMITIZA) 8 MCG capsule Take 8 mcg by mouth daily with breakfast.      . Multiple Vitamins-Minerals (ICAPS MV) TABS Take 1 tablet by mouth 2 (two) times daily.       Marland Kitchen omeprazole (PRILOSEC) 20 MG capsule Take 20 mg by mouth daily.        No current facility-administered medications on file prior to visit.    BP 138/80  Pulse 72  Temp(Src) 97.9 F (36.6 C)  Resp 16  Ht 5\' 5"  (1.651  m)  Wt 158 lb (71.668 kg)  BMI 26.29 kg/m2       Objective:   Physical Exam  Nursing note and vitals reviewed. Constitutional: He is oriented to person, place, and time. He appears well-developed and well-nourished.  HENT:  Head: Normocephalic and atraumatic.  conjestion and turbnated inflamed  Eyes: Conjunctivae are normal. Pupils are equal, round, and reactive to light.  Neck: Normal range of motion. Neck supple.  Cardiovascular: Normal rate and regular rhythm.   Pulmonary/Chest: Effort normal and breath sounds normal.  Abdominal: Soft. Bowel sounds are normal.  Neurological: He is alert and oriented to person, place, and time.  Skin: Skin is warm and dry.  Psychiatric:  anxiety          Assessment & Plan:  Constipation.Marland Kitchen Has noted relapse with the reduction of the amitiza and we recommend increase to 16 ( two 8)  Has noted significant erectile dysfunction ( he relates this to stopping smoking)  He notes  Some improvement in COPD He is challenged by age changing Lipid monitoring Drug side effect Prostate cancer risk in family Prevnar shot   Had pnemovax at [facility]  zpack for sinus infection  Subjective:    Ryan [name] is a 68 y.o. male who presents for Medicare Annual/Subsequent preventive examination.   Preventive Screening-Counseling & Management  Tobacco History  Smoking status  . Former Smoker -- 0.50 packs/day for 50 years  . Types: Cigarettes  . Quit date: 05/01/2013  Smokeless tobacco  . Former Systems developer    Comment: using e-Cig//ldc    Problems Prior to Visit 1.   Current Problems (verified) Patient Active Problem List   Diagnosis Date Noted  . Chronic constipation 03/06/2013  . Easy bruising 03/06/2013  . History of alcohol abuse 04/15/2012  . Atrial flutter with rapid ventricular response 03/31/2012  . CAD (coronary atherosclerotic disease) 05/08/2011  . 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 ADJUSTMENT DISORDER WITH DEPRESSED MOOD  06/20/2010  . ANGER 06/20/2010  . UNS ADVRS EFF UNS RX MEDICINAL&BIOLOGICAL SBSTNC 11/11/2009  . SMOKER 07/29/2009  . COPD GOLD I 07/29/2009  . OTHER SEBORRHEIC DERMATITIS 11/07/2007  . HYPERLIPIDEMIA 06/20/2007  . DEGENERATION, MACULAR NOS 06/20/2007  . HYPERTENSION 06/20/2007  . 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 GERD 06/16/2007    Medications Prior to Visit Current Outpatient Prescriptions on File Prior to Visit  Medication Sig Dispense Refill  . aspirin EC 81 MG EC tablet Take 81 mg by mouth daily.        Marland Kitchen  ezetimibe (ZETIA) 10 MG tablet Take 1 tablet by mouth  daily  90 tablet  3  . lubiprostone (AMITIZA) 8 MCG capsule Take 8 mcg by mouth daily with breakfast.      . Multiple Vitamins-Minerals (ICAPS MV) TABS Take 1 tablet by mouth 2 (two) times daily.       Marland Kitchen omeprazole (PRILOSEC) 20 MG capsule Take 20 mg by mouth daily.        No current facility-administered medications on file prior to visit.    Current Medications (verified) Current Outpatient Prescriptions  Medication Sig Dispense Refill  . aspirin EC 81 MG EC tablet Take 81 mg by mouth daily.        Marland Kitchen ezetimibe (ZETIA) 10 MG tablet Take 1 tablet by mouth  daily  90 tablet  3  . lubiprostone (AMITIZA) 8 MCG capsule Take 8 mcg by mouth daily with breakfast.      . Multiple Vitamins-Minerals (ICAPS MV) TABS Take 1 tablet by mouth 2 (two) times daily.       Marland Kitchen omeprazole (PRILOSEC) 20 MG capsule Take 20 mg by mouth daily.        No current facility-administered medications for this visit.     Allergies (verified) Polymyxin b-trimethoprim; Atorvastatin; Ciprofloxacin; Codeine phosphate; and Simvastatin   PAST HISTORY  Family History Family History  Problem Relation Age of Onset  . Liver disease    . Prostate cancer    . Coronary artery disease    . Heart disease Mother   . Diabetes Mother   . Cirrhosis Mother   . Heart disease Father   . Cancer Father     prostate  . Heart disease  Sister   . Arthritis Sister   . Heart disease Brother   . Emphysema Mother     never smoked but 2nd hand through her spouse    Social History History  Substance Use Topics  . Smoking status: Former Smoker -- 0.50 packs/day for 50 years    Types: Cigarettes    Quit date: 05/01/2013  . Smokeless tobacco: Former Systems developer     Comment: using e-Cig//ldc  . Alcohol Use: No    Are there smokers in your home (other than you)?  No  Risk Factors Current exercise habits: The patient has a physically strenuous job, but has no regular exercise apart from work.   Dietary issues discussed:    Cardiac risk factors: advanced age (older than 64 for men, 19 for women), dyslipidemia, family history of premature cardiovascular disease, hypertension and male gender.  Depression Screen (Note: if answer to either of the following is "Yes", a more complete depression screening is indicated)   Q1: Over the past two weeks, have you felt down, depressed or hopeless? Yes  Q2: Over the past two weeks, have you felt little interest or pleasure in doing things? No  Have you lost interest or pleasure in daily life? No  Do you often feel hopeless? No  Do you cry easily over simple problems? No  Activities of Daily Living In your present state of health, do you have any difficulty performing the following activities?:  Driving? No Managing money?  No Feeding yourself? No Getting from bed to chair? No Climbing a flight of stairs? No Preparing food and eating?: No Bathing or showering? No Getting dressed: No Getting to the toilet? No Using the toilet:No Moving around from place to place: No In the past year have you fallen or had a near fall?:No  Are you sexually active?  Yes  Do you have more than one partner?  No  Hearing Difficulties: No Do you often ask people to speak up or repeat themselves? No Do you experience ringing or noises in your ears? No Do you have difficulty understanding soft or  whispered voices? No   Do you feel that you have a problem with memory? No  Do you often misplace items? No  Do you feel safe at home?  yes  Cognitive Testing  Alert? Yes  Normal Appearance?Yes  Oriented to person? Yes  Place? Yes   Time? Yes  Recall of three objects?  Yes  Can perform simple calculations? Yes  Displays appropriate judgment?Yes  Can read the correct time from a watch face?Yes   Advanced Directives have been discussed with the patient? Yes   List the Names of Other Physician/Practitioners you currently use: 1.    Indicate any recent Medical Services you may have received from other than [facility] providers in the past year (date may be approximate).  Immunization History  Administered Date(s) Administered  . Influenza Split 07/06/2011, 06/22/2013  . Influenza Whole 10/05/2000, 07/18/2009, 06/20/2010, 07/05/2012  . Pneumococcal Conjugate-13 12/01/2013  . Pneumococcal Polysaccharide-23 07/06/2007  . Td 10/05/2002  . Tdap 05/06/2012  . Zoster 03/27/2009    Screening Tests Health Maintenance  Topic Date Due  . Colonoscopy  05/02/2014  . Influenza Vaccine  05/05/2014  . Tetanus/tdap  05/06/2022  . Pneumococcal Polysaccharide Vaccine Age 42 And Over  Addressed  . Zostavax  Completed    All answers were reviewed with the patient and necessary referrals were made:  Georgetta Haber, MD   12/01/2013   History reviewed: allergies, current medications, past family history, past medical history, past social history, past surgical history and problem list  Review of Systems Pertinent items are noted in HPI.    Objective:     Vision by Snellen chart: right eye:20/20, left eye:20/20 Blood pressure 138/80, pulse 72, temperature 97.9 F (36.6 C), resp. rate 16, height 5\' 5"  (1.651 m), weight 158 lb (71.668 kg). Body mass index is 26.29 kg/(m^2).  Exam per problems focused section     Assessment:     Patient presents for yearly preventative medicine  examination. Medicare questionnaire was completed  All immunizations and health maintenance protocols were reviewed with the patient and needed orders were placed.  Appropriate screening laboratory values were ordered for the patient including screening of hyperlipidemia, renal function and hepatic function. If indicated by BPH, a PSA was ordered.  Medication reconciliation,  past medical history, social history, problem list and allergies were reviewed in detail with the patient  Goals were established with regard to weight loss, exercise, and  diet in compliance with medications  End of life planning was discussed.        Plan:     During the course of the visit the patient was educated and counseled about appropriate screening and preventive services including:    Pneumococcal vaccine   Diet review for nutrition referral? Yes ____  Not Indicated __x__   Patient Instructions (the written plan) was given to the patient.  Medicare Attestation I have personally reviewed: The patient's medical and social history Their use of alcohol, tobacco or illicit drugs Their current medications and supplements The patient's functional ability including ADLs,fall risks, home safety risks, cognitive, and hearing and visual impairment Diet and physical activities Evidence for depression or mood disorders  The patient's weight, height, BMI, and visual acuity  have been recorded in the chart.  I have made referrals, counseling, and provided education to the patient based on review of the above and I have provided the patient with a written personalized care plan for preventive services.     Georgetta Haber, MD   12/01/2013

## 2013-12-04 ENCOUNTER — Telehealth: Payer: Self-pay | Admitting: Internal Medicine

## 2013-12-04 NOTE — Telephone Encounter (Signed)
Relevant patient education assigned to patient using Emmi. ° °

## 2014-01-16 DIAGNOSIS — IMO0002 Reserved for concepts with insufficient information to code with codable children: Secondary | ICD-10-CM | POA: Diagnosis not present

## 2014-01-16 DIAGNOSIS — Z6827 Body mass index (BMI) 27.0-27.9, adult: Secondary | ICD-10-CM | POA: Diagnosis not present

## 2014-01-16 DIAGNOSIS — M5137 Other intervertebral disc degeneration, lumbosacral region: Secondary | ICD-10-CM | POA: Diagnosis not present

## 2014-01-31 DIAGNOSIS — H2510 Age-related nuclear cataract, unspecified eye: Secondary | ICD-10-CM | POA: Diagnosis not present

## 2014-01-31 DIAGNOSIS — H353290 Exudative age-related macular degeneration, unspecified eye, stage unspecified: Secondary | ICD-10-CM | POA: Diagnosis not present

## 2014-01-31 DIAGNOSIS — H353190 Nonexudative age-related macular degeneration, unspecified eye, stage unspecified: Secondary | ICD-10-CM | POA: Diagnosis not present

## 2014-01-31 DIAGNOSIS — H43819 Vitreous degeneration, unspecified eye: Secondary | ICD-10-CM | POA: Diagnosis not present

## 2014-02-02 DIAGNOSIS — Z87898 Personal history of other specified conditions: Secondary | ICD-10-CM

## 2014-02-02 HISTORY — DX: Personal history of other specified conditions: Z87.898

## 2014-02-11 ENCOUNTER — Other Ambulatory Visit: Payer: Self-pay | Admitting: Internal Medicine

## 2014-02-25 ENCOUNTER — Observation Stay (HOSPITAL_COMMUNITY)
Admission: EM | Admit: 2014-02-25 | Discharge: 2014-02-26 | Disposition: A | Discharge status: Home / Self Care | Payer: Medicare Other | Attending: Internal Medicine | Admitting: Internal Medicine

## 2014-02-25 ENCOUNTER — Encounter (HOSPITAL_COMMUNITY): Payer: Self-pay | Admitting: Emergency Medicine

## 2014-02-25 DIAGNOSIS — J4489 Other specified chronic obstructive pulmonary disease: Secondary | ICD-10-CM | POA: Insufficient documentation

## 2014-02-25 DIAGNOSIS — Z862 Personal history of diseases of the blood and blood-forming organs and certain disorders involving the immune mechanism: Secondary | ICD-10-CM | POA: Diagnosis not present

## 2014-02-25 DIAGNOSIS — I2510 Atherosclerotic heart disease of native coronary artery without angina pectoris: Secondary | ICD-10-CM | POA: Diagnosis not present

## 2014-02-25 DIAGNOSIS — Z87891 Personal history of nicotine dependence: Secondary | ICD-10-CM | POA: Insufficient documentation

## 2014-02-25 DIAGNOSIS — Z79899 Other long term (current) drug therapy: Secondary | ICD-10-CM | POA: Insufficient documentation

## 2014-02-25 DIAGNOSIS — I4892 Unspecified atrial flutter: Secondary | ICD-10-CM

## 2014-02-25 DIAGNOSIS — R1084 Generalized abdominal pain: Secondary | ICD-10-CM | POA: Diagnosis not present

## 2014-02-25 DIAGNOSIS — R000 Tachycardia, unspecified: Secondary | ICD-10-CM | POA: Insufficient documentation

## 2014-02-25 DIAGNOSIS — Z9889 Other specified postprocedural states: Secondary | ICD-10-CM | POA: Diagnosis not present

## 2014-02-25 DIAGNOSIS — E782 Mixed hyperlipidemia: Secondary | ICD-10-CM | POA: Diagnosis present

## 2014-02-25 DIAGNOSIS — Z8669 Personal history of other diseases of the nervous system and sense organs: Secondary | ICD-10-CM | POA: Diagnosis not present

## 2014-02-25 DIAGNOSIS — Z7982 Long term (current) use of aspirin: Secondary | ICD-10-CM | POA: Insufficient documentation

## 2014-02-25 DIAGNOSIS — R42 Dizziness and giddiness: Secondary | ICD-10-CM | POA: Diagnosis not present

## 2014-02-25 DIAGNOSIS — I739 Peripheral vascular disease, unspecified: Secondary | ICD-10-CM | POA: Diagnosis present

## 2014-02-25 DIAGNOSIS — Z792 Long term (current) use of antibiotics: Secondary | ICD-10-CM | POA: Insufficient documentation

## 2014-02-25 DIAGNOSIS — J449 Chronic obstructive pulmonary disease, unspecified: Secondary | ICD-10-CM | POA: Diagnosis not present

## 2014-02-25 DIAGNOSIS — R011 Cardiac murmur, unspecified: Secondary | ICD-10-CM | POA: Insufficient documentation

## 2014-02-25 DIAGNOSIS — I4891 Unspecified atrial fibrillation: Secondary | ICD-10-CM | POA: Diagnosis not present

## 2014-02-25 DIAGNOSIS — R110 Nausea: Secondary | ICD-10-CM | POA: Diagnosis not present

## 2014-02-25 DIAGNOSIS — K219 Gastro-esophageal reflux disease without esophagitis: Secondary | ICD-10-CM | POA: Diagnosis not present

## 2014-02-25 DIAGNOSIS — R1033 Periumbilical pain: Secondary | ICD-10-CM | POA: Diagnosis not present

## 2014-02-25 DIAGNOSIS — R109 Unspecified abdominal pain: Secondary | ICD-10-CM

## 2014-02-25 DIAGNOSIS — R55 Syncope and collapse: Secondary | ICD-10-CM | POA: Diagnosis not present

## 2014-02-25 DIAGNOSIS — Z87442 Personal history of urinary calculi: Secondary | ICD-10-CM | POA: Insufficient documentation

## 2014-02-25 DIAGNOSIS — E785 Hyperlipidemia, unspecified: Secondary | ICD-10-CM | POA: Diagnosis not present

## 2014-02-25 DIAGNOSIS — I10 Essential (primary) hypertension: Secondary | ICD-10-CM | POA: Diagnosis present

## 2014-02-25 DIAGNOSIS — R404 Transient alteration of awareness: Secondary | ICD-10-CM | POA: Diagnosis not present

## 2014-02-25 LAB — CBC WITH DIFFERENTIAL/PLATELET
Basophils Absolute: 0 10*3/uL (ref 0.0–0.1)
Basophils Relative: 0 % (ref 0–1)
Eosinophils Absolute: 0.1 10*3/uL (ref 0.0–0.7)
Eosinophils Relative: 1 % (ref 0–5)
HCT: 38.3 % — ABNORMAL LOW (ref 39.0–52.0)
Hemoglobin: 13.3 g/dL (ref 13.0–17.0)
Lymphocytes Relative: 22 % (ref 12–46)
Lymphs Abs: 2 10*3/uL (ref 0.7–4.0)
MCH: 30.8 pg (ref 26.0–34.0)
MCHC: 34.7 g/dL (ref 30.0–36.0)
MCV: 88.7 fL (ref 78.0–100.0)
Monocytes Absolute: 0.6 10*3/uL (ref 0.1–1.0)
Monocytes Relative: 7 % (ref 3–12)
Neutro Abs: 6.5 10*3/uL (ref 1.7–7.7)
Neutrophils Relative %: 70 % (ref 43–77)
Platelets: 181 10*3/uL (ref 150–400)
RBC: 4.32 MIL/uL (ref 4.22–5.81)
RDW: 12.3 % (ref 11.5–15.5)
WBC: 9.2 10*3/uL (ref 4.0–10.5)

## 2014-02-25 LAB — COMPREHENSIVE METABOLIC PANEL
ALT: 12 U/L (ref 0–53)
AST: 18 U/L (ref 0–37)
Albumin: 3.7 g/dL (ref 3.5–5.2)
Alkaline Phosphatase: 64 U/L (ref 39–117)
BUN: 16 mg/dL (ref 6–23)
CO2: 22 mEq/L (ref 19–32)
Calcium: 9.1 mg/dL (ref 8.4–10.5)
Chloride: 104 mEq/L (ref 96–112)
Creatinine, Ser: 0.91 mg/dL (ref 0.50–1.35)
GFR calc Af Amer: >90 mL/min (ref >90)
GFR calc non Af Amer: 86 mL/min — ABNORMAL LOW (ref >90)
Glucose, Bld: 101 mg/dL — ABNORMAL HIGH (ref 70–99)
Potassium: 4 mEq/L (ref 3.7–5.3)
Sodium: 138 mEq/L (ref 137–147)
Total Bilirubin: 0.7 mg/dL (ref 0.3–1.2)
Total Protein: 6.9 g/dL (ref 6.0–8.3)

## 2014-02-25 LAB — I-STAT CG4 LACTIC ACID, ED: Lactic Acid, Venous: 0.57 mmol/L (ref 0.5–2.2)

## 2014-02-25 LAB — LIPASE, BLOOD: Lipase: 24 U/L (ref 11–59)

## 2014-02-25 MED ORDER — MORPHINE SULFATE 4 MG/ML IJ SOLN
4.0000 mg | Freq: Once | INTRAMUSCULAR | Status: AC
  Administered 2014-02-25: 4 mg via INTRAVENOUS
  Filled 2014-02-25: qty 1

## 2014-02-25 MED ORDER — ONDANSETRON HCL 4 MG/2ML IJ SOLN
4.0000 mg | Freq: Once | INTRAMUSCULAR | Status: AC
  Administered 2014-02-25: 4 mg via INTRAVENOUS
  Filled 2014-02-25: qty 2

## 2014-02-25 MED ORDER — SODIUM CHLORIDE 0.9 % IV BOLUS (SEPSIS)
1000.0000 mL | Freq: Once | INTRAVENOUS | Status: AC
  Administered 2014-02-26: 1000 mL via INTRAVENOUS

## 2014-02-25 MED ORDER — SODIUM CHLORIDE 0.9 % IV BOLUS (SEPSIS)
1000.0000 mL | Freq: Once | INTRAVENOUS | Status: AC
  Administered 2014-02-25: 1000 mL via INTRAVENOUS

## 2014-02-25 NOTE — ED Notes (Signed)
Patient states he had a BM yesterday with flecks of blood. He has a history of internal hemorrhoids.

## 2014-02-25 NOTE — ED Notes (Signed)
MD at bedside. 

## 2014-02-25 NOTE — ED Provider Notes (Signed)
CSN: [id number]     Arrival date & time 02/25/14  2113 History   First MD Initiated Contact with Patient 02/25/14 2228     Chief Complaint  Patient presents with  . presyncope   . Atrial Fibrillation    (Consider location/radiation/quality/duration/timing/severity/associated sxs/prior Treatment) Patient is a 68 y.o. male presenting with abdominal pain. The history is provided by the patient.  Abdominal Pain Pain location:  Periumbilical Pain quality: sharp   Pain radiates to:  Does not radiate Pain severity:  Moderate Onset quality:  Sudden Duration:  3 hours Timing:  Constant Progression:  Worsening Context: previous surgery (Appendectomy and cholecystectomy) and suspicious food intake (occurred after eating)   Context: not alcohol use, not medication withdrawal and not sick contacts   Relieved by:  Nothing Worsened by:  Nothing tried Ineffective treatments:  None tried Associated symptoms: nausea   Associated symptoms: no constipation, no fever, no hematemesis, no hematochezia and no vomiting   Associated symptoms comment:  Recurrent A. fib Risk factors: being elderly   Risk factors: no alcohol abuse     Past Medical History  Diagnosis Date  . H/O: rheumatic fever   . Kidney stones   . GERD (gastroesophageal reflux disease)   . Heart murmur   . Anemia   . Hypertension   . Macular degeneration   . Hyperlipidemia   . History of colonoscopy   . Heart murmur   . COPD (chronic obstructive pulmonary disease)   . Atrial flutter 03/31/2012    converted spontaneously to sinus  . High risk medication use     on amiodarone since 03/31/2012  . CAD (coronary artery disease)     reportedly moderate CAD; managed medically   Past Surgical History  Procedure Laterality Date  . Cervical discectomy    . Cholecystectomy    . Mouth surgery    . Knee cartiledge    . Foot surgery    . Appendectomy    . Cardiac catheterization  04/22/2011    moderate left main and RCA stenosis not  significant by FFR and IVUS on medical therapy   Family History  Problem Relation Age of Onset  . Liver disease    . Prostate cancer    . Coronary artery disease    . Heart disease Mother   . Diabetes Mother   . Cirrhosis Mother   . Heart disease Father   . Cancer Father     prostate  . Heart disease Sister   . Arthritis Sister   . Heart disease Brother   . Emphysema Mother     never smoked but 2nd hand through her spouse   History  Substance Use Topics  . Smoking status: Former Smoker -- 0.50 packs/day for 50 years    Types: Cigarettes    Quit date: 05/01/2013  . Smokeless tobacco: Former Systems developer     Comment: using e-Cig//ldc  . Alcohol Use: No    Review of Systems  Constitutional: Negative for fever.  Gastrointestinal: Positive for nausea and abdominal pain. Negative for vomiting, constipation, hematochezia and hematemesis.  All other systems reviewed and are negative.     Allergies  Polymyxin b-trimethoprim; Atorvastatin; Avelox; Mucinex d; Simvastatin; Vioxx; Ciprofloxacin; and Codeine phosphate  Home Medications   Prior to Admission medications   Medication Sig Start Date End Date Taking? Authorizing Provider  amoxicillin (AMOXIL) 500 MG capsule Take 2,000 mg by mouth See admin instructions. Take 4 capsules (2000 mg) prior to dental appointment 02/13/14  Yes  Historical Provider, MD  aspirin EC 81 MG EC tablet Take 81 mg by mouth daily with supper.    Yes Historical Provider, MD  clobetasol (TEMOVATE) 0.05 % external solution Apply 1 application topically 2 (two) times daily as needed (rosacea).  12/18/13  Yes Historical Provider, MD  ezetimibe (ZETIA) 10 MG tablet Take 10 mg by mouth daily with supper.   Yes Historical Provider, MD  lubiprostone (AMITIZA) 8 MCG capsule Take 8-16 mcg by mouth daily with supper. Take 1 tablet (8 mcg) 1st night, then take 2 tablets (16 mcg) 2nd night, then repeat   Yes Historical Provider, MD  Multiple Vitamins-Minerals (ICAPS MV) TABS  Take 1 tablet by mouth 2 (two) times daily.    Yes Historical Provider, MD  omeprazole (PRILOSEC OTC) 20 MG tablet Take 20 mg by mouth daily.   Yes Historical Provider, MD   BP 123/91  Pulse 86  Temp(Src) 98 F (36.7 C) (Oral)  Resp 16  SpO2 97% Physical Exam  Constitutional: He is oriented to person, place, and time. He appears well-developed and well-nourished. He appears distressed (writhing in bed in abdominal pain).  HENT:  Head: Normocephalic and atraumatic.  Eyes: Conjunctivae are normal.  Neck: Neck supple. No tracheal deviation present.  Cardiovascular: An irregularly irregular rhythm present. Tachycardia present.   Pulmonary/Chest: Effort normal. No respiratory distress.  Abdominal: Soft. He exhibits no distension. There is tenderness (diffusely, severe ).  Neurological: He is alert and oriented to person, place, and time.  Skin: Skin is warm and dry.  Psychiatric: He has a normal mood and affect.    ED Course  Procedures (including critical care time) Labs Review Labs Reviewed  COMPREHENSIVE METABOLIC PANEL - Abnormal; Notable for the following:    Glucose, Bld 101 (*)    GFR calc non Af Amer 86 (*)    All other components within normal limits  CBC WITH DIFFERENTIAL - Abnormal; Notable for the following:    HCT 38.3 (*)    All other components within normal limits  LIPASE, BLOOD  URINALYSIS, ROUTINE W REFLEX MICROSCOPIC  I-STAT CG4 LACTIC ACID, ED    Imaging Review Ct Cta Abd/pel W/cm &/or W/o Cm  02/26/2014   CLINICAL DATA:  Abdominal pain in periumbilical region, pre syncopal, orthostatic, nausea, vomiting, blood in stools, question bowel ischemia  EXAM: CTA ABDOMEN AND PELVIS wITHOUT AND WITH CONTRAST  TECHNIQUE: Multidetector CT imaging of the abdomen and pelvis was performed using the standard protocol during bolus administration of intravenous contrast. Multiplanar reconstructed images and MIPs were obtained and reviewed to evaluate the vascular anatomy.   CONTRAST:  115mL OMNIPAQUE IOHEXOL 350 MG/ML SOLN, 10mL OMNIPAQUE IOHEXOL 350 MG/ML SOLN IV reinjection  COMPARISON:  11/21/2007  FINDINGS: Lung bases clear.  Gallbladder surgically absent.  Tiny hepatic cyst.  Liver, spleen, pancreas, kidneys, and adrenal glands otherwise normal appearance.  Scattered atherosclerotic calcifications aorta without aneurysm or dissection.  Focal narrowing of proximal celiac artery approximately 50% diameter narrowing.  Mild narrowing at proximal SMA, less than 50% diameter narrowing.  IMA origin patent.  No aortic stenosis identified.  Scattered iliac calcifications without significant narrowing.  Sigmoid diverticulosis without evidence of diverticulitis.  Stomach and bowel loops otherwise unremarkable for technique.  Normal appearing bladder and ureters.  No mass, adenopathy, free fluid or inflammatory process.  No acute osseous findings.  Review of the MIP images confirms the above findings.  IMPRESSION: Scattered atherosclerotic calcifications without aortic aneurysm or dissection.  Focal narrowing of proximal celiac  artery approximately 50% diameter narrowing.  Milder degree of narrowing at the proximal SMA.  No acute bowel abnormalities.  Sigmoid diverticulosis.   Electronically Signed   By: Lavonia Dana M.D.   On: 02/26/2014 00:51     EKG Interpretation None      MDM   Final diagnoses:  Abdominal pain  Pre-syncope  Atrial fibrillation   68 y.o. male presents with abdominal pain that started suddenly at 8 PM, he had eaten dinner previously. There was a moment where the patient became disoriented and had a presyncopal episode followed by vomiting and intense abdominal pain. He does have history of intermittent A. fib and believes that he went into A. fib suddenly around 8 PM. His pain is periumbilical in nature, sharp, very tender to palpation over his abdomen out of proportion to exam. I am unsure how long the patient has truly been in a state of A. fib and with  pain out of proportion to exam and sudden onset, it is possible the patient has an arterial embolus of the mesenteric vasculature. CTA of the abdomen was ordered for evaluation of this. Patient does not have an appendix or gallbladder, has had no fevers, had no preceding symptoms.  EKG on arrival consistent with atrial fibrillation and intermittent RVR on monitor mostly related to bouts of pain. Pain improving with morphine but now having headache. Scan is negative for arterial embolus or signs of mesenteric or bowel ischemia. Provided Tylenol and GI cocktail for continued discomfort.  CTA was negative for acute ischemia, the patient's pain is controlled after multiple doses of analgesic. Continues to be in A. fib with short runs of bradycardia and tachycardia. I'm concerned that this patient's presyncopal episode could have been related to heart rate in A. fib so he will be admitted for observation on telemetry to ensure no significant variation in his rate or rhythm  Leo Grosser, MD 02/26/14 [phone]

## 2014-02-25 NOTE — ED Notes (Signed)
Patient with abdominal pain, periumbilical area.  Patient was presyncopal and orthostatic with EMS.  Patient with nausea and vomiting and feels like he needs to have a BM.  Patient was in AFib, RVR with EMS but has now resolved after being in trendelenburg.  Patient is CAOx3 at this time.

## 2014-02-26 ENCOUNTER — Encounter (HOSPITAL_COMMUNITY): Payer: Self-pay | Admitting: Internal Medicine

## 2014-02-26 ENCOUNTER — Emergency Department (HOSPITAL_COMMUNITY): Payer: Medicare Other

## 2014-02-26 DIAGNOSIS — J449 Chronic obstructive pulmonary disease, unspecified: Secondary | ICD-10-CM | POA: Diagnosis not present

## 2014-02-26 DIAGNOSIS — R109 Unspecified abdominal pain: Secondary | ICD-10-CM | POA: Diagnosis not present

## 2014-02-26 DIAGNOSIS — R55 Syncope and collapse: Secondary | ICD-10-CM | POA: Insufficient documentation

## 2014-02-26 DIAGNOSIS — I10 Essential (primary) hypertension: Secondary | ICD-10-CM

## 2014-02-26 DIAGNOSIS — I739 Peripheral vascular disease, unspecified: Secondary | ICD-10-CM | POA: Diagnosis not present

## 2014-02-26 DIAGNOSIS — I4892 Unspecified atrial flutter: Secondary | ICD-10-CM | POA: Diagnosis not present

## 2014-02-26 DIAGNOSIS — R1033 Periumbilical pain: Secondary | ICD-10-CM | POA: Diagnosis not present

## 2014-02-26 DIAGNOSIS — I2510 Atherosclerotic heart disease of native coronary artery without angina pectoris: Secondary | ICD-10-CM

## 2014-02-26 DIAGNOSIS — I4891 Unspecified atrial fibrillation: Secondary | ICD-10-CM

## 2014-02-26 LAB — COMPREHENSIVE METABOLIC PANEL
ALT: 10 U/L (ref 0–53)
AST: 15 U/L (ref 0–37)
Albumin: 3.1 g/dL — ABNORMAL LOW (ref 3.5–5.2)
Alkaline Phosphatase: 55 U/L (ref 39–117)
BUN: 13 mg/dL (ref 6–23)
CO2: 25 mEq/L (ref 19–32)
Calcium: 8.6 mg/dL (ref 8.4–10.5)
Chloride: 106 mEq/L (ref 96–112)
Creatinine, Ser: 0.99 mg/dL (ref 0.50–1.35)
GFR calc Af Amer: >90 mL/min (ref >90)
GFR calc non Af Amer: 83 mL/min — ABNORMAL LOW (ref >90)
Glucose, Bld: 90 mg/dL (ref 70–99)
Potassium: 4.3 mEq/L (ref 3.7–5.3)
Sodium: 141 mEq/L (ref 137–147)
Total Bilirubin: 0.5 mg/dL (ref 0.3–1.2)
Total Protein: 5.9 g/dL — ABNORMAL LOW (ref 6.0–8.3)

## 2014-02-26 LAB — CBC WITH DIFFERENTIAL/PLATELET
Basophils Absolute: 0 10*3/uL (ref 0.0–0.1)
Basophils Relative: 0 % (ref 0–1)
Eosinophils Absolute: 0.1 10*3/uL (ref 0.0–0.7)
Eosinophils Relative: 1 % (ref 0–5)
HCT: 35.4 % — ABNORMAL LOW (ref 39.0–52.0)
Hemoglobin: 12.3 g/dL — ABNORMAL LOW (ref 13.0–17.0)
Lymphocytes Relative: 34 % (ref 12–46)
Lymphs Abs: 2.2 10*3/uL (ref 0.7–4.0)
MCH: 31.5 pg (ref 26.0–34.0)
MCHC: 34.7 g/dL (ref 30.0–36.0)
MCV: 90.8 fL (ref 78.0–100.0)
Monocytes Absolute: 0.5 10*3/uL (ref 0.1–1.0)
Monocytes Relative: 7 % (ref 3–12)
Neutro Abs: 3.6 10*3/uL (ref 1.7–7.7)
Neutrophils Relative %: 58 % (ref 43–77)
Platelets: 165 10*3/uL (ref 150–400)
RBC: 3.9 MIL/uL — ABNORMAL LOW (ref 4.22–5.81)
RDW: 12.6 % (ref 11.5–15.5)
WBC: 6.4 10*3/uL (ref 4.0–10.5)

## 2014-02-26 LAB — I-STAT TROPONIN, ED: Troponin i, poc: 0.02 ng/mL (ref 0.00–0.08)

## 2014-02-26 LAB — TSH: TSH: 2.28 u[IU]/mL (ref 0.350–4.500)

## 2014-02-26 LAB — TROPONIN I: Troponin I: <0.3 ng/mL (ref <0.30)

## 2014-02-26 LAB — URINALYSIS, ROUTINE W REFLEX MICROSCOPIC
Bilirubin Urine: NEGATIVE
Glucose, UA: NEGATIVE mg/dL
Hgb urine dipstick: NEGATIVE
Ketones, ur: NEGATIVE mg/dL
Leukocytes, UA: NEGATIVE
Nitrite: NEGATIVE
Protein, ur: NEGATIVE mg/dL
Specific Gravity, Urine: 1.017 (ref 1.005–1.030)
Urobilinogen, UA: 0.2 mg/dL (ref 0.0–1.0)
pH: 6.5 (ref 5.0–8.0)

## 2014-02-26 LAB — T3, FREE: T3, Free: 3.6 pg/mL (ref 2.3–4.2)

## 2014-02-26 LAB — T4, FREE: Free T4: 1.05 ng/dL (ref 0.80–1.80)

## 2014-02-26 MED ORDER — ASPIRIN EC 325 MG PO TBEC
325.0000 mg | DELAYED_RELEASE_TABLET | Freq: Every day | ORAL | Status: DC
  Administered 2014-02-26: 325 mg via ORAL
  Filled 2014-02-26: qty 1

## 2014-02-26 MED ORDER — APIXABAN 5 MG PO TABS: 5.0000 mg | ORAL_TABLET | Freq: Two times a day (BID) | ORAL | Status: DC

## 2014-02-26 MED ORDER — ADULT MULTIVITAMIN W/MINERALS CH
1.0000 | ORAL_TABLET | Freq: Two times a day (BID) | ORAL | Status: DC
  Filled 2014-02-26 (×3): qty 1

## 2014-02-26 MED ORDER — GI COCKTAIL ~~LOC~~
30.0000 mL | Freq: Once | ORAL | Status: AC
  Administered 2014-02-26: 30 mL via ORAL
  Filled 2014-02-26: qty 30

## 2014-02-26 MED ORDER — ENOXAPARIN SODIUM 40 MG/0.4ML ~~LOC~~ SOLN
40.0000 mg | SUBCUTANEOUS | Status: DC
Start: 2014-02-26 — End: 2014-02-26
  Administered 2014-02-26: 40 mg via SUBCUTANEOUS
  Filled 2014-02-26: qty 0.4

## 2014-02-26 MED ORDER — METOPROLOL TARTRATE 25 MG PO TABS: ORAL_TABLET | ORAL | Status: DC

## 2014-02-26 MED ORDER — ACETAMINOPHEN 325 MG PO TABS: 650.0000 mg | ORAL_TABLET | Freq: Four times a day (QID) | ORAL | Status: DC | PRN

## 2014-02-26 MED ORDER — ACETAMINOPHEN 650 MG RE SUPP: 650.0000 mg | Freq: Four times a day (QID) | RECTAL | Status: DC | PRN

## 2014-02-26 MED ORDER — CLOBETASOL PROPIONATE 0.05 % EX CREA: 1.0000 "application " | TOPICAL_CREAM | Freq: Two times a day (BID) | CUTANEOUS | Status: DC | PRN

## 2014-02-26 MED ORDER — METOPROLOL TARTRATE 12.5 MG HALF TABLET: 12.5000 mg | ORAL_TABLET | Freq: Two times a day (BID) | ORAL | Status: DC

## 2014-02-26 MED ORDER — LUBIPROSTONE 8 MCG PO CAPS
8.0000 ug | ORAL_CAPSULE | ORAL | Status: DC
  Filled 2014-02-26: qty 1

## 2014-02-26 MED ORDER — ONDANSETRON HCL 4 MG PO TABS: 4.0000 mg | ORAL_TABLET | Freq: Four times a day (QID) | ORAL | Status: DC | PRN

## 2014-02-26 MED ORDER — SODIUM CHLORIDE 0.9 % IJ SOLN
3.0000 mL | Freq: Two times a day (BID) | INTRAMUSCULAR | Status: DC
  Administered 2014-02-26: 3 mL via INTRAVENOUS

## 2014-02-26 MED ORDER — IOHEXOL 350 MG/ML SOLN
80.0000 mL | Freq: Once | INTRAVENOUS | Status: AC | PRN
  Administered 2014-02-26: 80 mL via INTRAVENOUS

## 2014-02-26 MED ORDER — OMEPRAZOLE MAGNESIUM 20 MG PO TBEC
20.0000 mg | DELAYED_RELEASE_TABLET | Freq: Every day | ORAL | Status: DC
  Administered 2014-02-26: 20 mg via ORAL
  Filled 2014-02-26: qty 1

## 2014-02-26 MED ORDER — SODIUM CHLORIDE 0.9 % IV SOLN: INTRAVENOUS | Status: DC

## 2014-02-26 MED ORDER — EZETIMIBE 10 MG PO TABS
10.0000 mg | ORAL_TABLET | Freq: Every day | ORAL | Status: DC
  Filled 2014-02-26: qty 1

## 2014-02-26 MED ORDER — IOHEXOL 350 MG/ML SOLN
100.0000 mL | Freq: Once | INTRAVENOUS | Status: AC | PRN
  Administered 2014-02-26: 100 mL via INTRAVENOUS

## 2014-02-26 MED ORDER — LUBIPROSTONE 8 MCG PO CAPS: 16.0000 ug | ORAL_CAPSULE | ORAL | Status: DC

## 2014-02-26 MED ORDER — ONDANSETRON HCL 4 MG/2ML IJ SOLN: 4.0000 mg | Freq: Four times a day (QID) | INTRAMUSCULAR | Status: DC | PRN

## 2014-02-26 MED ORDER — ACETAMINOPHEN 500 MG PO TABS
1000.0000 mg | ORAL_TABLET | Freq: Once | ORAL | Status: AC
  Administered 2014-02-26: 1000 mg via ORAL
  Filled 2014-02-26: qty 2

## 2014-02-26 NOTE — Progress Notes (Signed)
CARE MANAGEMENT NOTE 02/26/2014  Patient:  Ryan [name], Ryan [name]   Account Number:  [id number]  Date Initiated:  02/26/2014  Documentation initiated by:  [facility]  Subjective/Objective Assessment:   Syncope, a-fib with RVR     Action/Plan:   lives at home with wife   Anticipated DC Date:  02/26/2014   Anticipated DC Plan:  [place]  CM consult  Medication Assistance      Choice offered to / List presented to:             Status of service:  Completed, signed off Medicare Important Message given?  YES (If response is "NO", the following Medicare IM given date fields will be blank) Date Medicare IM given:  02/25/2014 Date Additional Medicare IM given:    Discharge Disposition:  HOME/SELF CARE  Per UR Regulation:    If discussed at Long Length of Stay Meetings, dates discussed:    Comments:  02/26/2014 1230 NCM spoke to pt and provided 30 day free Eliquis card. NCM activated card. Contacted [facility] in [place] and they do have in stock. [name] RN CCM Case Mgmt phone [phone]

## 2014-02-26 NOTE — Consult Note (Signed)
CONSULTATION NOTE  Reason for Consult: Syncope, a-fib with RVR  Requesting Physician: Dr. Tana Coast  Cardiologist: Dr. Lovena Le / Dr. Johnsie Cancel  HPI: This is a 68 y.o. male with a past medical history significant for atrial flutter in the past which spontaneously converted and he was maintained on amiodarone for about 8 months. He was reportedly intolerant to amiodarone due to GI side effects and was taken off the medication. He was seen last by Dr. Lovena Le in July 2014 and was not having any active atrial fibrillation. Yesterday was reportedly working on his tractor and was struggling to take the wheel lost. He became quite tired and then went and laid down to take a nap. He reports he usually naps every day for about an hour, but yesterday's for about 2 and half hours. When he awakened he did not feel well and upon standing had a brief syncopal episode for 10 seconds or so while sitting in a chair. EMS was called and he was noted to be in atrial fibrillation with rapid ventricular response. Upon arrival in the ER he had reported some abdominal pain. He denied any chest pain. A CT of the abdomen and pelvis was negative. He did report having a glass of wine at dinner. He does have a prior history of alcohol use which was heavier. He reports that he has decreased his drinking significantly. He also has a history of rheumatic fever but is not thought to have any significant rheumatic heart disease. Echocardiogram in 2013 showed EF of 55-60%, there is a mildly calcified mitral valve annulus with mild regurgitation and the left atrium was mildly dilated.  Early this morning he was noted to convert to sinus rhythm. He has been generally rate controlled however today he was noted to be in and out of sinus rhythm and atrial fibrillation, still being paroxysmal. Initial troponin is negative. Electrolytes are within normal limits. TSH is 2.28.  Finally, his wife reports that he does have some abnormalities with sleep  at night. He sleeps about 6 hours each night and she notes that at times he does gasp for breath and may occasionally stop breathing. He also has significant napping later in the day especially while watching TV. This concerning for underlying sleep apnea.  PMHx:  Past Medical History  Diagnosis Date  . H/O: rheumatic fever   . Kidney stones   . GERD (gastroesophageal reflux disease)   . Heart murmur   . Anemia   . Hypertension   . Macular degeneration   . Hyperlipidemia   . History of colonoscopy   . Heart murmur   . COPD (chronic obstructive pulmonary disease)   . Atrial flutter 03/31/2012    converted spontaneously to sinus  . High risk medication use     on amiodarone since 03/31/2012  . CAD (coronary artery disease)     reportedly moderate CAD; managed medically   Past Surgical History  Procedure Laterality Date  . Cervical discectomy    . Cholecystectomy    . Mouth surgery    . Knee cartiledge    . Foot surgery    . Appendectomy    . Cardiac catheterization  04/22/2011    moderate left main and RCA stenosis not significant by FFR and IVUS on medical therapy    FAMHx: Family History  Problem Relation Age of Onset  . Liver disease    . Prostate cancer    . Coronary artery disease    . Heart disease  Mother   . Diabetes Mother   . Cirrhosis Mother   . Heart disease Father   . Cancer Father     prostate  . Heart disease Sister   . Arthritis Sister   . Heart disease Brother   . Emphysema Mother     never smoked but 2nd hand through her spouse    SOCHx:  reports that he quit smoking about 9 months ago. His smoking use included Cigarettes. He has a 25 pack-year smoking history. He has quit using smokeless tobacco. He reports that he does not drink alcohol or use illicit drugs.  ALLERGIES: Allergies  Allergen Reactions  . Polymyxin B-Trimethoprim Swelling    Eye drops made eyes swell  . Atorvastatin Other (See Comments)    Muscle aches and cramps  . Avelox  [Moxifloxacin] Other (See Comments)    Stomach cramps  . Mucinex D [Pseudoephedrine-Guaifenesin Er] Nausea And Vomiting    Stomach cramps  . Simvastatin Other (See Comments)    Muscle aches and cramps  . Vioxx [Rofecoxib] Other (See Comments)    Stomach cramping  . Ciprofloxacin Itching and Rash  . Codeine Phosphate Itching and Rash    ROS: A comprehensive review of systems was negative except for: Cardiovascular: positive for palpitations and syncope  HOME MEDICATIONS: Prescriptions prior to admission  Medication Sig Dispense Refill  . amoxicillin (AMOXIL) 500 MG capsule Take 2,000 mg by mouth See admin instructions. Take 4 capsules (2000 mg) prior to dental appointment      . aspirin EC 81 MG EC tablet Take 81 mg by mouth daily with supper.       . clobetasol (TEMOVATE) 0.05 % external solution Apply 1 application topically 2 (two) times daily as needed (rosacea).       . ezetimibe (ZETIA) 10 MG tablet Take 10 mg by mouth daily with supper.      . lubiprostone (AMITIZA) 8 MCG capsule Take 8-16 mcg by mouth daily with supper. Take 1 tablet (8 mcg) 1st night, then take 2 tablets (16 mcg) 2nd night, then repeat      . Multiple Vitamins-Minerals (ICAPS MV) TABS Take 1 tablet by mouth 2 (two) times daily.       Marland Kitchen omeprazole (PRILOSEC OTC) 20 MG tablet Take 20 mg by mouth daily.        HOSPITAL MEDICATIONS: Prior to Admission:  Prescriptions prior to admission  Medication Sig Dispense Refill  . amoxicillin (AMOXIL) 500 MG capsule Take 2,000 mg by mouth See admin instructions. Take 4 capsules (2000 mg) prior to dental appointment      . aspirin EC 81 MG EC tablet Take 81 mg by mouth daily with supper.       . clobetasol (TEMOVATE) 0.05 % external solution Apply 1 application topically 2 (two) times daily as needed (rosacea).       . ezetimibe (ZETIA) 10 MG tablet Take 10 mg by mouth daily with supper.      . lubiprostone (AMITIZA) 8 MCG capsule Take 8-16 mcg by mouth daily with supper.  Take 1 tablet (8 mcg) 1st night, then take 2 tablets (16 mcg) 2nd night, then repeat      . Multiple Vitamins-Minerals (ICAPS MV) TABS Take 1 tablet by mouth 2 (two) times daily.       Marland Kitchen omeprazole (PRILOSEC OTC) 20 MG tablet Take 20 mg by mouth daily.        VITALS: Blood pressure 103/51, pulse 62, temperature 97.4 F (36.3 C), temperature  source Oral, resp. rate 16, height _0  (1.626 m), weight 159 lb 6.4 oz (72.303 kg), SpO2 97.00%.  PHYSICAL EXAM: General appearance: alert and no distress Neck: no carotid bruit and no JVD Lungs: clear to auscultation bilaterally Heart: irregularly irregular rhythm Abdomen: soft, non-tender; bowel sounds normal; no masses,  no organomegaly Extremities: extremities normal, atraumatic, no cyanosis or edema Pulses: 2+ and symmetric Skin: Skin color, texture, turgor normal. No rashes or lesions or rosy cheeks Neurologic: Grossly normal Psych: Normal mood, affect  LABS: Results for orders placed during the hospital encounter of 02/25/14 (from the past 48 hour(s))  COMPREHENSIVE METABOLIC PANEL     Status: Abnormal   Collection Time    02/25/14 10:31 PM      Result Value Ref Range   Sodium 138  137 - 147 mEq/L   Potassium 4.0  3.7 - 5.3 mEq/L   Chloride 104  96 - 112 mEq/L   CO2 22  19 - 32 mEq/L   Glucose, Bld 101 (*) 70 - 99 mg/dL   BUN 16  6 - 23 mg/dL   Creatinine, Ser 0.91  0.50 - 1.35 mg/dL   Calcium 9.1  8.4 - 10.5 mg/dL   Total Protein 6.9  6.0 - 8.3 g/dL   Albumin 3.7  3.5 - 5.2 g/dL   AST 18  0 - 37 U/L   ALT 12  0 - 53 U/L   Alkaline Phosphatase 64  39 - 117 U/L   Total Bilirubin 0.7  0.3 - 1.2 mg/dL   GFR calc non Af Amer 86 (*) >90 mL/min   GFR calc Af Amer >90  >90 mL/min   Comment: (NOTE)     The eGFR has been calculated using the CKD EPI equation.     This calculation has not been validated in all clinical situations.     eGFR's persistently <90 mL/min signify possible Chronic Kidney     Disease.  LIPASE, BLOOD      Status: None   Collection Time    02/25/14 10:31 PM      Result Value Ref Range   Lipase 24  11 - 59 U/L  CBC WITH DIFFERENTIAL     Status: Abnormal   Collection Time    02/25/14 10:31 PM      Result Value Ref Range   WBC 9.2  4.0 - 10.5 K/uL   RBC 4.32  4.22 - 5.81 MIL/uL   Hemoglobin 13.3  13.0 - 17.0 g/dL   HCT 38.3 (*) 39.0 - 52.0 %   MCV 88.7  78.0 - 100.0 fL   MCH 30.8  26.0 - 34.0 pg   MCHC 34.7  30.0 - 36.0 g/dL   RDW 12.3  11.5 - 15.5 %   Platelets 181  150 - 400 K/uL   Neutrophils Relative % 70  43 - 77 %   Neutro Abs 6.5  1.7 - 7.7 K/uL   Lymphocytes Relative 22  12 - 46 %   Lymphs Abs 2.0  0.7 - 4.0 K/uL   Monocytes Relative 7  3 - 12 %   Monocytes Absolute 0.6  0.1 - 1.0 K/uL   Eosinophils Relative 1  0 - 5 %   Eosinophils Absolute 0.1  0.0 - 0.7 K/uL   Basophils Relative 0  0 - 1 %   Basophils Absolute 0.0  0.0 - 0.1 K/uL  I-STAT CG4 LACTIC ACID, ED     Status: None   Collection Time  02/25/14 10:44 PM      Result Value Ref Range   Lactic Acid, Venous 0.57  0.5 - 2.2 mmol/L  URINALYSIS, ROUTINE W REFLEX MICROSCOPIC     Status: None   Collection Time    02/26/14  1:35 AM      Result Value Ref Range   Color, Urine YELLOW  YELLOW   APPearance CLEAR  CLEAR   Specific Gravity, Urine 1.017  1.005 - 1.030   pH 6.5  5.0 - 8.0   Glucose, UA NEGATIVE  NEGATIVE mg/dL   Hgb urine dipstick NEGATIVE  NEGATIVE   Bilirubin Urine NEGATIVE  NEGATIVE   Ketones, ur NEGATIVE  NEGATIVE mg/dL   Protein, ur NEGATIVE  NEGATIVE mg/dL   Urobilinogen, UA 0.2  0.0 - 1.0 mg/dL   Nitrite NEGATIVE  NEGATIVE   Leukocytes, UA NEGATIVE  NEGATIVE   Comment: MICROSCOPIC NOT DONE ON URINES WITH NEGATIVE PROTEIN, BLOOD, LEUKOCYTES, NITRITE, OR GLUCOSE <1000 mg/dL.  Randolm Idol, ED     Status: None   Collection Time    02/26/14  1:56 AM      Result Value Ref Range   Troponin i, poc 0.02  0.00 - 0.08 ng/mL   Comment 3            Comment: Due to the release kinetics of cTnI,     a  negative result within the first hours     of the onset of symptoms does not rule out     myocardial infarction with certainty.     If myocardial infarction is still suspected,     repeat the test at appropriate intervals.  TROPONIN I     Status: None   Collection Time    02/26/14  8:33 AM      Result Value Ref Range   Troponin I <0.30  <0.30 ng/mL   Comment:            Due to the release kinetics of cTnI,     a negative result within the first hours     of the onset of symptoms does not rule out     myocardial infarction with certainty.     If myocardial infarction is still suspected,     repeat the test at appropriate intervals.  TSH     Status: None   Collection Time    02/26/14  8:33 AM      Result Value Ref Range   TSH 2.280  0.350 - 4.500 uIU/mL   Comment: Please note change in reference range.  COMPREHENSIVE METABOLIC PANEL     Status: Abnormal   Collection Time    02/26/14  8:33 AM      Result Value Ref Range   Sodium 141  137 - 147 mEq/L   Potassium 4.3  3.7 - 5.3 mEq/L   Chloride 106  96 - 112 mEq/L   CO2 25  19 - 32 mEq/L   Glucose, Bld 90  70 - 99 mg/dL   BUN 13  6 - 23 mg/dL   Creatinine, Ser 0.99  0.50 - 1.35 mg/dL   Calcium 8.6  8.4 - 10.5 mg/dL   Total Protein 5.9 (*) 6.0 - 8.3 g/dL   Albumin 3.1 (*) 3.5 - 5.2 g/dL   AST 15  0 - 37 U/L   ALT 10  0 - 53 U/L   Alkaline Phosphatase 55  39 - 117 U/L   Total Bilirubin 0.5  0.3 - 1.2 mg/dL  GFR calc non Af Amer 83 (*) >90 mL/min   GFR calc Af Amer >90  >90 mL/min   Comment: (NOTE)     The eGFR has been calculated using the CKD EPI equation.     This calculation has not been validated in all clinical situations.     eGFR's persistently <90 mL/min signify possible Chronic Kidney     Disease.  CBC WITH DIFFERENTIAL     Status: Abnormal   Collection Time    02/26/14  8:33 AM      Result Value Ref Range   WBC 6.4  4.0 - 10.5 K/uL   RBC 3.90 (*) 4.22 - 5.81 MIL/uL   Hemoglobin 12.3 (*) 13.0 - 17.0 g/dL    HCT 35.4 (*) 39.0 - 52.0 %   MCV 90.8  78.0 - 100.0 fL   MCH 31.5  26.0 - 34.0 pg   MCHC 34.7  30.0 - 36.0 g/dL   RDW 12.6  11.5 - 15.5 %   Platelets 165  150 - 400 K/uL   Neutrophils Relative % 58  43 - 77 %   Neutro Abs 3.6  1.7 - 7.7 K/uL   Lymphocytes Relative 34  12 - 46 %   Lymphs Abs 2.2  0.7 - 4.0 K/uL   Monocytes Relative 7  3 - 12 %   Monocytes Absolute 0.5  0.1 - 1.0 K/uL   Eosinophils Relative 1  0 - 5 %   Eosinophils Absolute 0.1  0.0 - 0.7 K/uL   Basophils Relative 0  0 - 1 %   Basophils Absolute 0.0  0.0 - 0.1 K/uL    IMAGING: Ct Cta Abd/pel W/cm &/or W/o Cm  02/26/2014   CLINICAL DATA:  Abdominal pain in periumbilical region, pre syncopal, orthostatic, nausea, vomiting, blood in stools, question bowel ischemia  EXAM: CTA ABDOMEN AND PELVIS wITHOUT AND WITH CONTRAST  TECHNIQUE: Multidetector CT imaging of the abdomen and pelvis was performed using the standard protocol during bolus administration of intravenous contrast. Multiplanar reconstructed images and MIPs were obtained and reviewed to evaluate the vascular anatomy.  CONTRAST:  168m OMNIPAQUE IOHEXOL 350 MG/ML SOLN, 863mOMNIPAQUE IOHEXOL 350 MG/ML SOLN IV reinjection  COMPARISON:  11/21/2007  FINDINGS: Lung bases clear.  Gallbladder surgically absent.  Tiny hepatic cyst.  Liver, spleen, pancreas, kidneys, and adrenal glands otherwise normal appearance.  Scattered atherosclerotic calcifications aorta without aneurysm or dissection.  Focal narrowing of proximal celiac artery approximately 50% diameter narrowing.  Mild narrowing at proximal SMA, less than 50% diameter narrowing.  IMA origin patent.  No aortic stenosis identified.  Scattered iliac calcifications without significant narrowing.  Sigmoid diverticulosis without evidence of diverticulitis.  Stomach and bowel loops otherwise unremarkable for technique.  Normal appearing bladder and ureters.  No mass, adenopathy, free fluid or inflammatory process.  No acute osseous  findings.  Review of the MIP images confirms the above findings.  IMPRESSION: Scattered atherosclerotic calcifications without aortic aneurysm or dissection.  Focal narrowing of proximal celiac artery approximately 50% diameter narrowing.  Milder degree of narrowing at the proximal SMA.  No acute bowel abnormalities.  Sigmoid diverticulosis.   Electronically Signed   By: MaLavonia Dana.D.   On: 02/26/2014 00:51    HOSPITAL DIAGNOSES: Principal Problem:   Syncope Active Problems:   HYPERLIPIDEMIA   Atrial flutter with rapid ventricular response   Abdominal pain   IMPRESSION: 1. Atrial fibrillation with rapid ventricular response - CHADSVASC score of 2 (HTN,PAD) - he is  a smoker with Etoh use as well. 2. Daytime somnolence with concern for possible sleep apnea 3. Brief syncope in the setting of A. fib with RVR 4. 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 Mr. Netto had A. fib with RVR which is likely responsible for his brief syncopal/presyncopal episode. His heart rate is now better controlled and he has been in and out of A. fib this morning - therefore somewhat persistent. His CHADSVASC score is 2 and I suspect he has been having asymptomatic a-fib for a longer period of time as he is unaware of it when the rate is lower. I have therefore recommended discontinuing aspirin and starting Eliquis 5 mg BID. He has some concerns about ocular bleeding risk with macular degeneration that will need to be discussed with his opthalmologist. I am also concerned about underlying obstructive sleep apnea as a possible cause of his symptoms. I would recommend an outpatient sleep study.  Finally, will recommend low-dose metoprolol tartrate 12.5 mg BID for rate control as he was symptomatic with rapid ventricular response. I feel that he can be discharged home today and our office will be in contact for a follow-up appointment.  Time Spent Directly with  Patient: 45 minutes  Pixie Casino, MD, [facility] Attending Cardiologist [place] 02/26/2014, 11:11 AM

## 2014-02-26 NOTE — H&P (Signed)
[facility] History and Physical  Ryan [name] GHW:[id number] DOB: [birth year] DOA: 02/25/2014  Referring physician: ER physician. PCP: Georgetta Haber, MD  Specialists: Dr. Crissie Sickles. Cardiologist.  Chief Complaint: Loss of consciousness.  HPI: Ryan [name] is a 68 y.o. male with history of paroxysmal atrial fibrillation/flutter on aspirin, COPD, hyperlipidemia route 8 PM while talking with his friends started developing dizziness and went and sat on the chair at his house. His wife who was by his side bend check on him and found that patient had refill of consciousness for around 10 seconds. Patient had no seizure-like activities or incontinence of urine or tongue bite. Did not have any chest pain or shortness of breath. EMS was called and patient was found to be in A. fib with RVR. Following the episode patient developed nausea vomiting twice and epigastric pain. In the ER since patient has significant abdominal pain patient had CT abdomen and pelvis which was negative for anything acute. Patient heart rate improved without any intervention. Patient's monitor shows frequent pauses. Since patient has syncopal episode with atrial flutter patient will be admitted for further observation and cardiac consult in a.m. Patient states that his symptoms started after having dinner where patient had sig 8 AM the bowel and one glass of wine. On exam patient is nonfocal. Patient has mild headache for which he has received Tylenol.  Review of Systems: As presented in the history of presenting illness, rest negative.  Past Medical History  Diagnosis Date  . H/O: rheumatic fever   . Kidney stones   . GERD (gastroesophageal reflux disease)   . Heart murmur   . Anemia   . Hypertension   . Macular degeneration   . Hyperlipidemia   . History of colonoscopy   . Heart murmur   . COPD (chronic obstructive pulmonary disease)   . Atrial flutter 03/31/2012    converted spontaneously to  sinus  . High risk medication use     on amiodarone since 03/31/2012  . CAD (coronary artery disease)     reportedly moderate CAD; managed medically   Past Surgical History  Procedure Laterality Date  . Cervical discectomy    . Cholecystectomy    . Mouth surgery    . Knee cartiledge    . Foot surgery    . Appendectomy    . Cardiac catheterization  04/22/2011    moderate left main and RCA stenosis not significant by FFR and IVUS on medical therapy   Social History:  reports that he quit smoking about 9 months ago. His smoking use included Cigarettes. He has a 25 pack-year smoking history. He has quit using smokeless tobacco. He reports that he does not drink alcohol or use illicit drugs. Where does patient live home. Can patient participate in ADLs? Yes.  Allergies  Allergen Reactions  . Polymyxin B-Trimethoprim Swelling    Eye drops made eyes swell  . Atorvastatin Other (See Comments)    Muscle aches and cramps  . Avelox [Moxifloxacin] Other (See Comments)    Stomach cramps  . Mucinex D [Pseudoephedrine-Guaifenesin Er] Nausea And Vomiting    Stomach cramps  . Simvastatin Other (See Comments)    Muscle aches and cramps  . Vioxx [Rofecoxib] Other (See Comments)    Stomach cramping  . Ciprofloxacin Itching and Rash  . Codeine Phosphate Itching and Rash    Family History:  Family History  Problem Relation Age of Onset  . Liver disease    . Prostate cancer    .  Coronary artery disease    . Heart disease Mother   . Diabetes Mother   . Cirrhosis Mother   . Heart disease Father   . Cancer Father     prostate  . Heart disease Sister   . Arthritis Sister   . Heart disease Brother   . Emphysema Mother     never smoked but 2nd hand through her spouse      Prior to Admission medications   Medication Sig Start Date End Date Taking? Authorizing Provider  amoxicillin (AMOXIL) 500 MG capsule Take 2,000 mg by mouth See admin instructions. Take 4 capsules (2000 mg) prior to  dental appointment 02/13/14  Yes Historical Provider, MD  aspirin EC 81 MG EC tablet Take 81 mg by mouth daily with supper.    Yes Historical Provider, MD  clobetasol (TEMOVATE) 0.05 % external solution Apply 1 application topically 2 (two) times daily as needed (rosacea).  12/18/13  Yes Historical Provider, MD  ezetimibe (ZETIA) 10 MG tablet Take 10 mg by mouth daily with supper.   Yes Historical Provider, MD  lubiprostone (AMITIZA) 8 MCG capsule Take 8-16 mcg by mouth daily with supper. Take 1 tablet (8 mcg) 1st night, then take 2 tablets (16 mcg) 2nd night, then repeat   Yes Historical Provider, MD  Multiple Vitamins-Minerals (ICAPS MV) TABS Take 1 tablet by mouth 2 (two) times daily.    Yes Historical Provider, MD  omeprazole (PRILOSEC OTC) 20 MG tablet Take 20 mg by mouth daily.   Yes Historical Provider, MD    Physical Exam: Filed Vitals:   02/26/14 0000 02/26/14 0115 02/26/14 0200 02/26/14 0254  BP: 115/79 117/84 119/80 110/60  Pulse: 100 89 83 88  Temp:    98.4 F (36.9 C)  TempSrc:      Resp: 19 10 14 18   SpO2: 97% 98% 96% 99%     General:  Well-developed and nourished.  Eyes: Anicteric no pallor.  ENT: No discharge from the ears eyes nose mouth.  Neck: No mass felt.  Cardiovascular: S1-S2 heard.  Respiratory: No rhonchi or crepitations.  Abdomen: Soft nontender bowel sounds present. No guarding or rigidity.  Skin: No rash.  Musculoskeletal: No edema.  Psychiatric: Appears normal.  Neurologic: Alert awake oriented to time place and person. Moves all extremities.  Labs on Admission:  Basic Metabolic Panel:  Recent Labs Lab 02/25/14 2231  NA 138  K 4.0  CL 104  CO2 22  GLUCOSE 101*  BUN 16  CREATININE 0.91  CALCIUM 9.1   Liver Function Tests:  Recent Labs Lab 02/25/14 2231  AST 18  ALT 12  ALKPHOS 64  BILITOT 0.7  PROT 6.9  ALBUMIN 3.7    Recent Labs Lab 02/25/14 2231  LIPASE 24   No results found for this basename: AMMONIA,  in the  last 168 hours CBC:  Recent Labs Lab 02/25/14 2231  WBC 9.2  NEUTROABS 6.5  HGB 13.3  HCT 38.3*  MCV 88.7  PLT 181   Cardiac Enzymes: No results found for this basename: CKTOTAL, CKMB, CKMBINDEX, TROPONINI,  in the last 168 hours  BNP (last 3 results) No results found for this basename: PROBNP,  in the last 8760 hours CBG: No results found for this basename: GLUCAP,  in the last 168 hours  Radiological Exams on Admission: Ct Cta Abd/pel W/cm &/or W/o Cm  02/26/2014   CLINICAL DATA:  Abdominal pain in periumbilical region, pre syncopal, orthostatic, nausea, vomiting, blood in stools, question bowel  ischemia  EXAM: CTA ABDOMEN AND PELVIS wITHOUT AND WITH CONTRAST  TECHNIQUE: Multidetector CT imaging of the abdomen and pelvis was performed using the standard protocol during bolus administration of intravenous contrast. Multiplanar reconstructed images and MIPs were obtained and reviewed to evaluate the vascular anatomy.  CONTRAST:  118mL OMNIPAQUE IOHEXOL 350 MG/ML SOLN, 92mL OMNIPAQUE IOHEXOL 350 MG/ML SOLN IV reinjection  COMPARISON:  11/21/2007  FINDINGS: Lung bases clear.  Gallbladder surgically absent.  Tiny hepatic cyst.  Liver, spleen, pancreas, kidneys, and adrenal glands otherwise normal appearance.  Scattered atherosclerotic calcifications aorta without aneurysm or dissection.  Focal narrowing of proximal celiac artery approximately 50% diameter narrowing.  Mild narrowing at proximal SMA, less than 50% diameter narrowing.  IMA origin patent.  No aortic stenosis identified.  Scattered iliac calcifications without significant narrowing.  Sigmoid diverticulosis without evidence of diverticulitis.  Stomach and bowel loops otherwise unremarkable for technique.  Normal appearing bladder and ureters.  No mass, adenopathy, free fluid or inflammatory process.  No acute osseous findings.  Review of the MIP images confirms the above findings.  IMPRESSION: Scattered atherosclerotic calcifications  without aortic aneurysm or dissection.  Focal narrowing of proximal celiac artery approximately 50% diameter narrowing.  Milder degree of narrowing at the proximal SMA.  No acute bowel abnormalities.  Sigmoid diverticulosis.   Electronically Signed   By: Lavonia Dana M.D.   On: 02/26/2014 00:51    EKG: Independently reviewed. Atrial flutter rate controlled.  Assessment/Plan Principal Problem:   Syncope Active Problems:   HYPERLIPIDEMIA   Atrial flutter with rapid ventricular response   Abdominal pain   1. Syncope with atrial flutter - we will continue to observe in telemetry. Patient's CHADS2VASC score is zero. Patient will be on aspirin. Consult patient's cardiologist in a.m. 2. Abdominal pain - possibly related to food. Observe. Presently patient is pain-free. CT abdomen and pelvis unremarkable. 3. COPD - presently not wheezing. 4. Hyperlipidemia - continue present medication.    Code Status: Full code.  Family Communication: Patient's wife at the bedside.  Disposition Plan: Admit for observation.    [facility] Pager [phone].  If 7PM-7AM, please contact night-coverage [email or web address] Password [email or web address] 02/26/2014, 3:11 AM

## 2014-02-26 NOTE — ED Provider Notes (Signed)
I saw and evaluated the patient, reviewed the resident's note and I agree with the findings and plan.   EKG Interpretation   Date/Time:  Sunday Feb 25 2014 21:28:36 EDT Ventricular Rate:  90 PR Interval:    QRS Duration: 84 QT Interval:  347 QTC Calculation: 424 R Axis:   52 Text Interpretation:  Atrial fibrillation Confirmed by Alvino Chapel  MD,  Ovid Curd [phone]) on 02/26/2014 1:31:52 AM     Patient with abdominal pain. Also atrial fib RVR that has resolved. He has not been on coagulation in the past with known atrial fibrillation. CT angiography abdomen did not show acute ischemia. Discharge home  Ryan [name]. Alvino Chapel, MD 02/26/14 1501

## 2014-02-26 NOTE — Discharge Summary (Signed)
Physician Discharge Summary  Patient ID: Ryan [name] MRN: [id number] DOB/AGE: Jun 19, 1946 68 y.o.  Admit date: 02/25/2014 Discharge date: 02/26/2014  Primary Care Physician:  Georgetta Haber, MD  Discharge Diagnoses:    . Syncope likely due to atrial flutter with RVR  . HYPERLIPIDEMIA . Atrial flutter with rapid ventricular response . Abdominal pain . PAD (peripheral artery disease) . HYPERTENSION . CAD (coronary atherosclerotic disease)  Consults:  Cardiology, Dr. Debara Pickett   Recommendations for Outpatient Follow-up:  Patient was in and out of atrial fibrillation during hospitalization, hence it was decided to place him on anti-coagulation, Eliquis. He will need followup closely with Dr. Johnsie Cancel and Dr. Lovena Le (his primary cardiologist).  Allergies:   Allergies  Allergen Reactions  . Polymyxin B-Trimethoprim Swelling    Eye drops made eyes swell  . Atorvastatin Other (See Comments)    Muscle aches and cramps  . Avelox [Moxifloxacin] Other (See Comments)    Stomach cramps  . Mucinex D [Pseudoephedrine-Guaifenesin Er] Nausea And Vomiting    Stomach cramps  . Simvastatin Other (See Comments)    Muscle aches and cramps  . Vioxx [Rofecoxib] Other (See Comments)    Stomach cramping  . Ciprofloxacin Itching and Rash  . Codeine Phosphate Itching and Rash     Discharge Medications:   Medication List    STOP taking these medications       aspirin EC 81 MG tablet      TAKE these medications       amoxicillin 500 MG capsule  Commonly known as:  AMOXIL  Take 2,000 mg by mouth See admin instructions. Take 4 capsules (2000 mg) prior to dental appointment     apixaban 5 MG Tabs tablet  Commonly known as:  ELIQUIS  Take 1 tablet (5 mg total) by mouth 2 (two) times daily.     clobetasol 0.05 % external solution  Commonly known as:  TEMOVATE  Apply 1 application topically 2 (two) times daily as needed (rosacea).     ezetimibe 10 MG tablet  Commonly known as:   ZETIA  Take 10 mg by mouth daily with supper.     ICAPS MV Tabs  Take 1 tablet by mouth 2 (two) times daily.     lubiprostone 8 MCG capsule  Commonly known as:  AMITIZA  Take 8-16 mcg by mouth daily with supper. Take 1 tablet (8 mcg) 1st night, then take 2 tablets (16 mcg) 2nd night, then repeat     metoprolol tartrate 25 MG tablet  Commonly known as:  LOPRESSOR  Take metoprolol 12.5 mg (split 25mg  tab to half) oral BID (twice a day).     omeprazole 20 MG tablet  Commonly known as:  PRILOSEC OTC  Take 20 mg by mouth daily.         Brief H and P: For complete details please refer to admission H and P, but in brief Ryan [name] is a 68 y.o. male with history of paroxysmal atrial fibrillation/flutter on aspirin, COPD, hyperlipidemia route 8 PM while talking with his friends started developing dizziness and went and sat on the chair at his house. His wife who was by his side bend check on him and found that patient had refill of consciousness for around 10 seconds. Patient had no seizure-like activities or incontinence of urine or tongue bite. Did not have any chest pain or shortness of breath. EMS was called and patient was found to be in A. fib with RVR. Following the episode patient  developed nausea vomiting twice and epigastric pain. In the ER since patient has significant abdominal pain patient had CT abdomen and pelvis which was negative for anything acute. Patient heart rate improved without any intervention. Patient's monitor shows frequent pauses. Since patient has syncopal episode with atrial flutter patient will be admitted for further observation and cardiac consult in a.m. Patient states that his symptoms started after having dinner where patient had sig 8 AM the bowel and one glass of wine.   Hospital Course:     Syncope: Likely due to atrial fibrillation with RVR. The patient was observed closely on telemetry. He continued to have paroxysmal A. fib and had been in and out  of A. fib. Cardiology was consulted and recommended small dose of metoprolol 12.5mg  BID. He was also recommended anticoagulation to prevent stroke and placed on eliquis 5mg  BID. Per Cardiology, should have close follow up appointment with cardiology. He probably should have an outpatient sleep study to rule out any obstructive sleep apnea. CT angiogram was negative for any PE. TSH was 2.2. Patient is otherwise currently back to his baseline.  Constipation: The patient has a history of hemorrhoids he was strongly counseled to stay on stool softeners. He was counseled to relay to his primary care physician ASAP if any GI bleed..   Day of Discharge BP 103/51  Pulse 62  Temp(Src) 97.4 F (36.3 C) (Oral)  Resp 16  Ht 5\' 4"  (1.626 m)  Wt 72.303 kg (159 lb 6.4 oz)  BMI 27.35 kg/m2  SpO2 97%  Physical Exam: General: Alert and awake oriented x3 not in any acute distress. CVS: S1-S2 clear no murmur rubs or gallops Chest: clear to auscultation bilaterally, no wheezing rales or rhonchi Abdomen: soft nontender, nondistended, normal bowel sounds Extremities: no cyanosis, clubbing or edema noted bilaterally Neuro: Cranial nerves II-XII intact, no focal neurological deficits   The results of significant diagnostics from this hospitalization (including imaging, microbiology, ancillary and laboratory) are listed below for reference.    LAB RESULTS: Basic Metabolic Panel:  Recent Labs Lab 02/25/14 2231 02/26/14 0833  NA 138 141  K 4.0 4.3  CL 104 106  CO2 22 25  GLUCOSE 101* 90  BUN 16 13  CREATININE 0.91 0.99  CALCIUM 9.1 8.6   Liver Function Tests:  Recent Labs Lab 02/25/14 2231 02/26/14 0833  AST 18 15  ALT 12 10  ALKPHOS 64 55  BILITOT 0.7 0.5  PROT 6.9 5.9*  ALBUMIN 3.7 3.1*    Recent Labs Lab 02/25/14 2231  LIPASE 24   No results found for this basename: AMMONIA,  in the last 168 hours CBC:  Recent Labs Lab 02/25/14 2231 02/26/14 0833  WBC 9.2 6.4  NEUTROABS  6.5 3.6  HGB 13.3 12.3*  HCT 38.3* 35.4*  MCV 88.7 90.8  PLT 181 165   Cardiac Enzymes:  Recent Labs Lab 02/26/14 0833  TROPONINI <0.30   BNP: No components found with this basename: POCBNP,  CBG: No results found for this basename: GLUCAP,  in the last 168 hours  Significant Diagnostic Studies:  Ct Cta Abd/pel W/cm &/or W/o Cm  02/26/2014   CLINICAL DATA:  Abdominal pain in periumbilical region, pre syncopal, orthostatic, nausea, vomiting, blood in stools, question bowel ischemia  EXAM: CTA ABDOMEN AND PELVIS wITHOUT AND WITH CONTRAST  TECHNIQUE: Multidetector CT imaging of the abdomen and pelvis was performed using the standard protocol during bolus administration of intravenous contrast. Multiplanar reconstructed images and MIPs were obtained and reviewed  to evaluate the vascular anatomy.  CONTRAST:  181mL OMNIPAQUE IOHEXOL 350 MG/ML SOLN, 43mL OMNIPAQUE IOHEXOL 350 MG/ML SOLN IV reinjection  COMPARISON:  11/21/2007  FINDINGS: Lung bases clear.  Gallbladder surgically absent.  Tiny hepatic cyst.  Liver, spleen, pancreas, kidneys, and adrenal glands otherwise normal appearance.  Scattered atherosclerotic calcifications aorta without aneurysm or dissection.  Focal narrowing of proximal celiac artery approximately 50% diameter narrowing.  Mild narrowing at proximal SMA, less than 50% diameter narrowing.  IMA origin patent.  No aortic stenosis identified.  Scattered iliac calcifications without significant narrowing.  Sigmoid diverticulosis without evidence of diverticulitis.  Stomach and bowel loops otherwise unremarkable for technique.  Normal appearing bladder and ureters.  No mass, adenopathy, free fluid or inflammatory process.  No acute osseous findings.  Review of the MIP images confirms the above findings.  IMPRESSION: Scattered atherosclerotic calcifications without aortic aneurysm or dissection.  Focal narrowing of proximal celiac artery approximately 50% diameter narrowing.  Milder  degree of narrowing at the proximal SMA.  No acute bowel abnormalities.  Sigmoid diverticulosis.   Electronically Signed   By: Lavonia Dana M.D.   On: 02/26/2014 00:51       Disposition and Follow-up:    DISPOSITION: Home DIET: Heart healthy diet   DISCHARGE FOLLOW-UP     Follow-up Information   Follow up with Jenkins Rouge, MD. Schedule an appointment as soon as possible for a visit in 10 days. (for hospital follow-up)    Specialty:  Cardiology   Contact information:   [address] [place] [address] [phone]       Follow up with Georgetta Haber, MD. Schedule an appointment as soon as possible for a visit in 2 weeks. (for hospital follow-up)    Specialty:  Internal Medicine   Contact information:   [facility] [address] [phone]       Time spent on Discharge: 35 minutes  Signed:   Mendel Corning M.D. [facility] 02/26/2014, 11:56 AM Pager: [phone]   **Disclaimer: This note was dictated with voice recognition software. Similar sounding words can inadvertently be transcribed and this note may contain transcription errors which may not have been corrected upon publication of note.**

## 2014-02-26 NOTE — Discharge Instructions (Signed)
Information on my medicine - ELIQUIS (apixaban)  This medication education was reviewed with me or my healthcare representative as part of my discharge preparation.  The pharmacist that spoke with me during my hospital stay was:  Lorenda Peck, [place]  Why was Eliquis prescribed for you? Eliquis was prescribed for you to reduce the risk of a blood clot forming that can cause a stroke if you have a medical condition called atrial fibrillation (a type of irregular heartbeat).  What do You need to know about Eliquis ? Take your Eliquis TWICE DAILY - one tablet in the morning and one tablet in the evening with or without food. If you have difficulty swallowing the tablet whole please discuss with your pharmacist how to take the medication safely.  Take Eliquis exactly as prescribed by your doctor and DO NOT stop taking Eliquis without talking to the doctor who prescribed the medication.  Stopping may increase your risk of developing a stroke.  Refill your prescription before you run out.  After discharge, you should have regular check-up appointments with your healthcare provider that is prescribing your Eliquis.  In the future your dose may need to be changed if your kidney function or weight changes by a significant amount or as you get older.  What do you do if you miss a dose? If you miss a dose, take it as soon as you remember on the same day and resume taking twice daily.  Do not take more than one dose of ELIQUIS at the same time to make up a missed dose.  Important Safety Information A possible side effect of Eliquis is bleeding. You should call your healthcare provider right away if you experience any of the following:   Bleeding from an injury or your nose that does not stop.   Unusual colored urine (red or dark brown) or unusual colored stools (red or black).   Unusual bruising for unknown reasons.   A serious fall or if you hit your head (even if there is no bleeding).  Some  medicines may interact with Eliquis and might increase your risk of bleeding or clotting while on Eliquis. To help avoid this, consult your healthcare provider or pharmacist prior to using any new prescription or non-prescription medications, including herbals, vitamins, non-steroidal anti-inflammatory drugs (NSAIDs) and supplements.  This website has more information on Eliquis (apixaban): [email or web address].

## 2014-02-26 NOTE — Progress Notes (Signed)
Pt provided with dc instructions and education.  Pt verbalized understanding. Pt has no questions. Aware of new medications and how to take them. IV removed with tip intact. Heart monitor cleaned and returned to front. Dorna Bloom, RN

## 2014-02-28 ENCOUNTER — Other Ambulatory Visit: Payer: Self-pay

## 2014-03-01 ENCOUNTER — Ambulatory Visit (INDEPENDENT_AMBULATORY_CARE_PROVIDER_SITE_OTHER): Payer: Medicare Other | Admitting: Internal Medicine

## 2014-03-01 ENCOUNTER — Encounter: Payer: Self-pay | Admitting: Internal Medicine

## 2014-03-01 VITALS — BP 122/62 | HR 52 | Ht 64.0 in | Wt 159.0 lb

## 2014-03-01 DIAGNOSIS — R5381 Other malaise: Secondary | ICD-10-CM | POA: Diagnosis not present

## 2014-03-01 DIAGNOSIS — R400 Somnolence: Secondary | ICD-10-CM

## 2014-03-01 DIAGNOSIS — R55 Syncope and collapse: Secondary | ICD-10-CM | POA: Diagnosis not present

## 2014-03-01 DIAGNOSIS — G4730 Sleep apnea, unspecified: Secondary | ICD-10-CM | POA: Insufficient documentation

## 2014-03-01 DIAGNOSIS — I4892 Unspecified atrial flutter: Secondary | ICD-10-CM | POA: Diagnosis not present

## 2014-03-01 DIAGNOSIS — Z7901 Long term (current) use of anticoagulants: Secondary | ICD-10-CM | POA: Insufficient documentation

## 2014-03-01 DIAGNOSIS — I2510 Atherosclerotic heart disease of native coronary artery without angina pectoris: Secondary | ICD-10-CM | POA: Diagnosis not present

## 2014-03-01 DIAGNOSIS — R5383 Other fatigue: Secondary | ICD-10-CM | POA: Diagnosis not present

## 2014-03-01 DIAGNOSIS — G4710 Hypersomnia, unspecified: Secondary | ICD-10-CM

## 2014-03-01 NOTE — Progress Notes (Signed)
HPI [name] returns today for followup. He is a very pleasant 68year-old man with a history of atrial fibrillation, and was initially placed on amiodarone. He became intolerant of this medication and it was discontinued approximately 16 months ago. In the interim, he experienced an episode of syncope several days ago. He felt weak, got clammy, sat down and then passed out. No tongue biting or loss of bowel or bladder continence. He vomited after the episode and was diaphoretic. He was told he was going in and out of atrial fib. He does not have palpitations. The patient's main complaint today is regarding daytime somnolence and his wife notes that he snores at night time. Finally, he has questions regarding his anti-coagulation regarding upcoming invasive procedures.  Allergies  Allergen Reactions  . Polymyxin B-Trimethoprim Swelling    Eye drops made eyes swell  . Atorvastatin Other (See Comments)    Muscle aches and cramps  . Avelox [Moxifloxacin] Other (See Comments)    Stomach cramps  . Mucinex D [Pseudoephedrine-Guaifenesin Er] Nausea And Vomiting    Stomach cramps  . Simvastatin Other (See Comments)    Muscle aches and cramps  . Vioxx [Rofecoxib] Other (See Comments)    Stomach cramping  . Ciprofloxacin Itching and Rash  . Codeine Phosphate Itching and Rash     Current Outpatient Prescriptions  Medication Sig Dispense Refill  . apixaban (ELIQUIS) 5 MG TABS tablet Take 1 tablet (5 mg total) by mouth 2 (two) times daily.  60 tablet  0  . clobetasol (TEMOVATE) 0.05 % external solution Apply 1 application topically 2 (two) times daily as needed (rosacea).       . ezetimibe (ZETIA) 10 MG tablet Take 10 mg by mouth daily with supper.      . lubiprostone (AMITIZA) 8 MCG capsule Take 1-2 tablets a day      . metoprolol tartrate (LOPRESSOR) 25 MG tablet Take metoprolol 12.5 mg (split 25mg  tab to half) oral BID (twice a day).  60 tablet  4  . Multiple Vitamins-Minerals (ICAPS MV) TABS Take  1 tablet by mouth 2 (two) times daily.       Marland Kitchen omeprazole (PRILOSEC OTC) 20 MG tablet Take 20 mg by mouth daily.      Marland Kitchen amoxicillin (AMOXIL) 500 MG capsule Take 2,000 mg by mouth See admin instructions. Take 4 capsules (2000 mg) prior to dental appointment       No current facility-administered medications for this visit.     Past Medical History  Diagnosis Date  . H/O: rheumatic fever   . Kidney stones   . GERD (gastroesophageal reflux disease)   . Heart murmur   . Anemia   . Hypertension   . Macular degeneration   . Hyperlipidemia   . History of colonoscopy   . Heart murmur   . COPD (chronic obstructive pulmonary disease)   . Atrial flutter 03/31/2012    converted spontaneously to sinus  . High risk medication use     on amiodarone since 03/31/2012  . CAD (coronary artery disease)     reportedly moderate CAD; managed medically    ROS:   All systems reviewed and negative except as noted in the HPI.   Past Surgical History  Procedure Laterality Date  . Cervical discectomy    . Cholecystectomy    . Mouth surgery    . Knee cartiledge    . Foot surgery    . Appendectomy    . Cardiac catheterization  04/22/2011  moderate left main and RCA stenosis not significant by FFR and IVUS on medical therapy     Family History  Problem Relation Age of Onset  . Liver disease    . Prostate cancer    . Coronary artery disease    . Heart disease Mother   . Diabetes Mother   . Cirrhosis Mother   . Heart disease Father   . Cancer Father     prostate  . Heart disease Sister   . Arthritis Sister   . Heart disease Brother   . Emphysema Mother     never smoked but 2nd hand through her spouse     History   Social History  . Marital Status: Married    Spouse Name: N/A    Number of Children: 0  . Years of Education: N/A   Occupational History  . retired    Social History Main Topics  . Smoking status: Former Smoker -- 0.50 packs/day for 50 years    Types: Cigarettes     Quit date: 05/01/2013  . Smokeless tobacco: Former Systems developer     Comment: using e-Cig//ldc  . Alcohol Use: No  . Drug Use: No  . Sexual Activity: Yes   Other Topics Concern  . Not on file   Social History Narrative  . No narrative on file     BP 122/62  Pulse 52  Ht 5\' 4"  (1.626 m)  Wt 159 lb (72.122 kg)  BMI 27.28 kg/m2  SpO2 98%  Physical Exam:  Well appearing 68 year old man,NAD HEENT: Unremarkable Neck:  6 cm JVD, no thyromegally Lungs:  Clear with no wheezes, rales, or rhonchi. HEART:  Regular rate rhythm, no murmurs, no rubs, no clicks Abd:  soft, positive bowel sounds, no organomegally, no rebound, no guarding Ext:  2 plus pulses, no edema, no cyanosis, no clubbing Skin:  No rashes no nodules Neuro:  CN II through XII intact, motor grossly intact  EKG - normal sinus rhythm    Assess/Plan:

## 2014-03-01 NOTE — Assessment & Plan Note (Signed)
His episode is likely vagal/autonomic dysfunction. He is encouraged to remain hydrated, go to the ground if he feels a spell coming on and maintain adequate hydration.

## 2014-03-01 NOTE — Patient Instructions (Signed)
Your physician recommends that you schedule a follow-up appointment in: 6-8 weeks with [name]   Your physician has recommended that you have a sleep study. This test records several body functions during sleep, including: brain activity, eye movement, oxygen and carbon dioxide blood levels, heart rate and rhythm, breathing rate and rhythm, the flow of air through your mouth and nose, snoring, body muscle movements, and chest and belly movement.  Okay to hold Eliquis 2 days prior to any invasive procedure and restart at the MD doing procedures advice

## 2014-03-01 NOTE — Assessment & Plan Note (Signed)
This has not been proven with a sleep study but he has daytime somnolence and snores at night. I will schedule a sleep study.

## 2014-03-01 NOTE — Assessment & Plan Note (Signed)
The patient is instructed to stop his Pradaxa 2.5 days before any invasive procedure. He may restart his anti-coagulation based on the recommendations of the MD doing the procedure keeping in mind that Pradaxa will make him therapeutically anti-coagulated within 4 hours of starting the medication back.

## 2014-03-05 ENCOUNTER — Telehealth: Payer: Self-pay | Admitting: Internal Medicine

## 2014-03-05 NOTE — Telephone Encounter (Signed)
Patient needs a note to be excused from [place] duty 03/21/14. The last day for him to turn in note is 03/08/14. Please call and advise.

## 2014-03-05 NOTE — Telephone Encounter (Signed)
I spoke with the pt and made him aware that [name] will have to review his chart to determine if a jury letter is appropriate for this pt's medical condition. The pt said [name] had advised him not to drive due to his AFib and going to jury duty would be difficult due to transportation.  I will forward this message to [name] and Janan Halter RN to review and contact the pt this week.

## 2014-03-06 NOTE — Telephone Encounter (Signed)
Spoke with patient and let him know we can not write a note to excuse for afib

## 2014-03-13 ENCOUNTER — Encounter: Payer: Medicare Other | Admitting: Cardiology

## 2014-03-14 DIAGNOSIS — M47817 Spondylosis without myelopathy or radiculopathy, lumbosacral region: Secondary | ICD-10-CM | POA: Diagnosis not present

## 2014-03-14 DIAGNOSIS — M5137 Other intervertebral disc degeneration, lumbosacral region: Secondary | ICD-10-CM | POA: Diagnosis not present

## 2014-03-14 DIAGNOSIS — IMO0002 Reserved for concepts with insufficient information to code with codable children: Secondary | ICD-10-CM | POA: Diagnosis not present

## 2014-03-16 ENCOUNTER — Telehealth: Payer: Self-pay | Admitting: Internal Medicine

## 2014-03-16 NOTE — Telephone Encounter (Signed)
New problem   Pt want to know should he keep the metoprolol in his pocket just in case he goes into afib. Please advise pt.

## 2014-03-16 NOTE — Telephone Encounter (Signed)
He is going to keep his Metoprolol with him in case he goes out of rhythm and it's fast.  This is what [name] has suggested for him to do.  He will keep a pill with him and take if needed

## 2014-03-21 ENCOUNTER — Telehealth: Payer: Self-pay

## 2014-03-21 ENCOUNTER — Encounter: Payer: Self-pay | Admitting: Physician Assistant

## 2014-03-21 ENCOUNTER — Ambulatory Visit (INDEPENDENT_AMBULATORY_CARE_PROVIDER_SITE_OTHER): Payer: Medicare Other | Admitting: Physician Assistant

## 2014-03-21 VITALS — BP 130/70 | HR 51 | Temp 97.9°F | Resp 18 | Wt 159.0 lb

## 2014-03-21 DIAGNOSIS — B370 Candidal stomatitis: Secondary | ICD-10-CM | POA: Diagnosis not present

## 2014-03-21 DIAGNOSIS — I2510 Atherosclerotic heart disease of native coronary artery without angina pectoris: Secondary | ICD-10-CM

## 2014-03-21 DIAGNOSIS — J029 Acute pharyngitis, unspecified: Secondary | ICD-10-CM | POA: Diagnosis not present

## 2014-03-21 DIAGNOSIS — R5383 Other fatigue: Principal | ICD-10-CM

## 2014-03-21 DIAGNOSIS — R5381 Other malaise: Secondary | ICD-10-CM

## 2014-03-21 NOTE — Progress Notes (Signed)
Pre visit review using our clinic review tool, if applicable. No additional management support is needed unless otherwise documented below in the visit note. 

## 2014-03-21 NOTE — Telephone Encounter (Signed)
Dr Ron Parker is DOD today and we received a call from a NP from the [facility] at [facility] stating that the pt was there being seen for coughing, sore throat and fatigue. The NP told Dr Ron Parker that the pt no longer has a PCP as Dr Arnoldo Morale has retired and wanted [place] to see the pt today for his s/s. Because the pt is not having cardiac problems at this time the NP was advised that we would contact the pt later today concerning being seen. Per Dr Ron Parker I called Dr Arnoldo Morale office and spoke with Dr. Beverely Risen (after calling office X 2 and being on hold for greater than 10 mins) who asked me to contact the pt and advise him that he may come to his office today as a walk in for a OV. The pt is advised and he verbalized understanding and thanked me for my help.

## 2014-03-21 NOTE — Patient Instructions (Addendum)
Treat your thrush using [facility].  Take metoprolol half 25mg  tablet once per day, or using pill splitter in attempt quarter of 25mg  tablet twice per day.  Call Dr. Tanna Furry office and schedule an appointment with him to followup within the next week.  If emergency symptoms discussed during visit developed, seek medical attention immediately.  Followup in 1 month to reassess, or for worsening or persistent symptoms despite treatment.   Bradycardia Bradycardia is a term for a heart rate (pulse) that, in adults, is slower than 60 beats per minute. A normal rate is 60 to 100 beats per minute. A heart rate below 60 beats per minute may be normal for some adults with healthy hearts. If the rate is too slow, the heart may have trouble pumping the volume of blood the body needs. If the heart rate gets too low, blood flow to the brain may be decreased and may make you feel lightheaded, dizzy, or faint. The heart has a natural pacemaker in the top of the heart called the SA node (sinoatrial or sinus node). This pacemaker sends out regular electrical signals to the muscle of the heart, telling the heart muscle when to beat (contract). The electrical signal travels from the upper parts of the heart (atria) through the AV node (atrioventricular node), to the lower chambers of the heart (ventricles). The ventricles squeeze, pumping the blood from your heart to your lungs and to the rest of your body. CAUSES   Problem with the heart's electrical system.  Problem with the heart's natural pacemaker.  Heart disease, damage, or infection.  Medications.  Problems with minerals and salts (electrolytes). SYMPTOMS   Fainting (syncope).  Fatigue and weakness.  Shortness of breath (dyspnea).  Chest pain (angina).  Drowsiness.  Confusion. DIAGNOSIS   An electrocardiogram (ECG) can help your caregiver determine the type of slow heart rate you have.  If the cause is not seen on  an ECG, you may need to wear a heart monitor that records your heart rhythm for several hours or days.  Blood tests. TREATMENT   Electrolyte supplements.  Medications.  Withholding medication which is causing a slow heart rate.  Pacemaker placement. SEEK IMMEDIATE MEDICAL CARE IF:   You feel lightheaded or faint.  You develop an irregular heart rate.  You feel chest pain or have trouble breathing. MAKE SURE YOU:   Understand these instructions.  Will watch your condition.  Will get help right away if you are not doing well or get worse. Document Released: 06/13/2002 Document Revised: 12/14/2011 Document Reviewed: 05/09/2008 [facility] Patient Information 2015 [place], [place]. This information is not intended to replace advice given to you by your health care provider. Make sure you discuss any questions you have with your health care provider.

## 2014-03-21 NOTE — Progress Notes (Signed)
Subjective:    Patient ID: Ryan [name], male    DOB: June 22, 1946, 68 y.o.   MRN: [id number]  HPI [facility] mouth and throat with white patches, and dx with thrush. Was prescribed an anti-candidal swish and spit medication to treat. At that visit, the patient was complaining of lethargy and had a heart beat in the upper 40s.  The patient had a heart arrythmia in May requiring hospital admission. Was placed on Metoprolol and Eliquis by his cardiologist Dr. Lovena Le due to this incident. States he has felt exhausted more and more ever since being placed on metoprolol. Patient has also been referred for a sleep study to evaluate for OSA. Patient states that, while he had been experiencing a little fatigue in the early afternoon prior to this incident, the bulk of his fatigue and lethargy have arisen following being put on metoprolol. He denies fevers, chills, nausea, vomiting, diarrhea, shortness of breath, chest pain, headache, and syncope.   Review of Systems As per history of present illness and otherwise negative.   Past Medical History  Diagnosis Date  . H/O: rheumatic fever   . Kidney stones   . GERD (gastroesophageal reflux disease)   . Heart murmur   . Anemia   . Hypertension   . Macular degeneration   . Hyperlipidemia   . History of colonoscopy   . Heart murmur   . COPD (chronic obstructive pulmonary disease)   . Atrial flutter 03/31/2012    converted spontaneously to sinus  . High risk medication use     on amiodarone since 03/31/2012  . CAD (coronary artery disease)     reportedly moderate CAD; managed medically    History   Social History  . Marital Status: Married    Spouse Name: N/A    Number of Children: 0  . Years of Education: N/A   Occupational History  . retired    Social History Main Topics  . Smoking status: Former Smoker -- 0.50 packs/day for 50 years    Types: Cigarettes    Quit date: 05/01/2013  . Smokeless  tobacco: Former Systems developer     Comment: using e-Cig//ldc  . Alcohol Use: No  . Drug Use: No  . Sexual Activity: Yes   Other Topics Concern  . Not on file   Social History Narrative  . No narrative on file    Past Surgical History  Procedure Laterality Date  . Cervical discectomy    . Cholecystectomy    . Mouth surgery    . Knee cartiledge    . Foot surgery    . Appendectomy    . Cardiac catheterization  04/22/2011    moderate left main and RCA stenosis not significant by FFR and IVUS on medical therapy    Family History  Problem Relation Age of Onset  . Liver disease    . Prostate cancer    . Coronary artery disease    . Heart disease Mother   . Diabetes Mother   . Cirrhosis Mother   . Heart disease Father   . Cancer Father     prostate  . Heart disease Sister   . Arthritis Sister   . Heart disease Brother   . Emphysema Mother     never smoked but 2nd hand through her spouse    Allergies  Allergen Reactions  . Polymyxin B-Trimethoprim Swelling    Eye drops made eyes swell  . Atorvastatin Other (  See Comments)    Muscle aches and cramps  . Avelox [Moxifloxacin] Other (See Comments)    Stomach cramps  . Mucinex D [Pseudoephedrine-Guaifenesin Er] Nausea And Vomiting    Stomach cramps  . Simvastatin Other (See Comments)    Muscle aches and cramps  . Vioxx [Rofecoxib] Other (See Comments)    Stomach cramping  . Ciprofloxacin Itching and Rash  . Codeine Phosphate Itching and Rash    Current Outpatient Prescriptions on File Prior to Visit  Medication Sig Dispense Refill  . apixaban (ELIQUIS) 5 MG TABS tablet Take 1 tablet (5 mg total) by mouth 2 (two) times daily.  60 tablet  0  . clobetasol (TEMOVATE) 0.05 % external solution Apply 1 application topically 2 (two) times daily as needed (rosacea).       . ezetimibe (ZETIA) 10 MG tablet Take 10 mg by mouth daily with supper.      . lubiprostone (AMITIZA) 8 MCG capsule Take 1-2 tablets a day      . metoprolol tartrate  (LOPRESSOR) 25 MG tablet Take metoprolol 12.5 mg (split 25mg  tab to half) oral BID (twice a day).  60 tablet  4  . Multiple Vitamins-Minerals (ICAPS MV) TABS Take 1 tablet by mouth 2 (two) times daily.       Marland Kitchen omeprazole (PRILOSEC OTC) 20 MG tablet Take 20 mg by mouth daily.      Marland Kitchen amoxicillin (AMOXIL) 500 MG capsule Take 2,000 mg by mouth See admin instructions. Take 4 capsules (2000 mg) prior to dental appointment       No current facility-administered medications on file prior to visit.    EXAM: BP 130/70  Pulse 51  Temp(Src) 97.9 F (36.6 C) (Oral)  Resp 18  Wt 159 lb (72.122 kg)  SpO2 98%     Objective:   Physical Exam  Nursing note and vitals reviewed. Constitutional: He is oriented to person, place, and time. He appears well-developed and well-nourished. No distress.  HENT:  Head: Normocephalic and atraumatic.  Right Ear: External ear normal.  Left Ear: External ear normal.  Nose: Nose normal.  Mouth/Throat: No oropharyngeal exudate.  Oropharynx erythematous with white patches to buccal mucosa  Eyes: Conjunctivae and EOM are normal. Pupils are equal, round, and reactive to light.  Neck: Normal range of motion. Neck supple.  Cardiovascular: Regular rhythm, normal heart sounds and intact distal pulses.  Exam reveals no gallop and no friction rub.   No murmur heard. Bradycardia  Pulmonary/Chest: Effort normal and breath sounds normal. No respiratory distress. He has no wheezes. He has no rales. He exhibits no tenderness.  Musculoskeletal: Normal range of motion.  Neurological: He is alert and oriented to person, place, and time.  Skin: Skin is warm and dry. No rash noted. He is not diaphoretic. No erythema. No pallor.  Psychiatric: He has a normal mood and affect. His behavior is normal. Judgment and thought content normal.    Lab Results  Component Value Date   WBC 6.4 02/26/2014   HGB 12.3* 02/26/2014   HCT 35.4* 02/26/2014   PLT 165 02/26/2014   GLUCOSE 90 02/26/2014     CHOL 204* 12/01/2013   TRIG 61.0 12/01/2013   HDL 51.50 12/01/2013   LDLDIRECT 134.2 12/01/2013   LDLCALC 138* 11/07/2012   ALT 10 02/26/2014   AST 15 02/26/2014   NA 141 02/26/2014   K 4.3 02/26/2014   CL 106 02/26/2014   CREATININE 0.99 02/26/2014   BUN 13 02/26/2014   CO2  25 02/26/2014   TSH 2.280 02/26/2014   PSA 1.37 12/01/2013   INR 1.17 04/01/2012         Assessment & Plan:  Malaki was seen today for fatigue and sore throat.  Diagnoses and associated orders for this visit:  Other malaise and fatigue Comments: Per pt, only since starting metoprolol. Will have pt half current dosage and follow up with Cardiologist Dr. Lovena Le.  Thrush Comments: Pt will fill [facility].    Dr. Rexene Agent regarding patient by triage nurse at cardiology office. They had been [facility], however felt that the concern was URI related in nature, and suggested PCP office visit. Due to symptom onset/worsening following prescription of metoprolol, Dr. Raliegh Ip. and myself think it would be best for patient to half his current dose of metoprolol and followup quickly with cardiology to reassess.  Return precautions provided, and patient handout on bradycardia.  Plan to follow up in 1 month to reassess, or for worsening or persistent symptoms despite treatment.  Patient Instructions  Treat your thrush using the Rx [facility].  Take metoprolol half 25mg  tablet once per day, or using pill splitter in attempt quarter of 25mg  tablet twice per day.  Call Dr. Tanna Furry office and schedule an appointment with him to followup within the next week.  If emergency symptoms discussed during visit developed, seek medical attention immediately.  Followup in 1 month to reassess, or for worsening or persistent symptoms despite treatment.

## 2014-03-22 ENCOUNTER — Telehealth: Payer: Self-pay | Admitting: Internal Medicine

## 2014-03-22 NOTE — Telephone Encounter (Signed)
error 

## 2014-03-22 NOTE — Telephone Encounter (Signed)
New problem   Pt stated his Metoprolol is causing him to have bradycardia per his pcp/ please call pt.

## 2014-03-23 ENCOUNTER — Other Ambulatory Visit: Payer: Self-pay | Admitting: *Deleted

## 2014-03-23 MED ORDER — APIXABAN 5 MG PO TABS: 5.0000 mg | ORAL_TABLET | Freq: Two times a day (BID) | ORAL | Status: DC

## 2014-03-23 NOTE — Telephone Encounter (Signed)
Discussed with [name] yesterday and he has advised to stop Metoprolol.  I have left a message on the voicemail for patient to call me in regards to this

## 2014-03-26 ENCOUNTER — Telehealth: Payer: Self-pay | Admitting: *Deleted

## 2014-03-26 NOTE — Telephone Encounter (Signed)
eliquis approved through [place] through 03/27/2015 Pharmacy notified

## 2014-03-26 NOTE — Telephone Encounter (Signed)
PA to [facility] for eliquis

## 2014-03-28 ENCOUNTER — Telehealth: Payer: Self-pay | Admitting: Physician Assistant

## 2014-03-28 NOTE — Telephone Encounter (Signed)
Pt called to req a rx on magic mouthwash aka visc-lidocaine he has been taking this and has ran out was told by [facility] if he needed a refill to call the office pharmacy [facility]  [phone]

## 2014-03-31 NOTE — Telephone Encounter (Signed)
Attempted to call pt to see how he was feeling.

## 2014-04-02 NOTE — Telephone Encounter (Signed)
Left a message for return call.  

## 2014-04-09 ENCOUNTER — Ambulatory Visit (HOSPITAL_BASED_OUTPATIENT_CLINIC_OR_DEPARTMENT_OTHER): Payer: Medicare Other | Attending: Internal Medicine

## 2014-04-09 DIAGNOSIS — I4892 Unspecified atrial flutter: Secondary | ICD-10-CM

## 2014-04-09 DIAGNOSIS — I4949 Other premature depolarization: Secondary | ICD-10-CM | POA: Insufficient documentation

## 2014-04-09 DIAGNOSIS — R5381 Other malaise: Secondary | ICD-10-CM

## 2014-04-09 DIAGNOSIS — R400 Somnolence: Secondary | ICD-10-CM

## 2014-04-09 DIAGNOSIS — R259 Unspecified abnormal involuntary movements: Secondary | ICD-10-CM | POA: Insufficient documentation

## 2014-04-09 DIAGNOSIS — G4733 Obstructive sleep apnea (adult) (pediatric): Secondary | ICD-10-CM | POA: Insufficient documentation

## 2014-04-09 DIAGNOSIS — R5383 Other fatigue: Secondary | ICD-10-CM

## 2014-04-17 DIAGNOSIS — G4730 Sleep apnea, unspecified: Secondary | ICD-10-CM

## 2014-04-17 DIAGNOSIS — G4710 Hypersomnia, unspecified: Secondary | ICD-10-CM

## 2014-04-17 NOTE — Sleep Study (Signed)
   NAME: Ryan [name] DATE OF BIRTH:  Jul 24, 1946 MEDICAL RECORD NUMBER [id number]  LOCATION: [facility]  PHYSICIAN: Kathee Delton  DATE OF STUDY: 04/09/2014  SLEEP STUDY TYPE: Nocturnal Polysomnogram               REFERRING PHYSICIAN: Evans Lance, MD  INDICATION FOR STUDY: Hypersomnia with sleep apnea  EPWORTH SLEEPINESS SCORE:  10 HEIGHT:    WEIGHT:      There is no weight on file to calculate BMI.  NECK SIZE: 17 in.  MEDICATIONS: Reviewed in the sleep record  SLEEP ARCHITECTURE: The patient had a total sleep time of 302 minutes, with no slow-wave sleep and only 36 minutes of REM. Sleep onset latency was normal at 12 minutes, and REM onset was prolonged at 269 minutes. Sleep efficiency was moderately reduced at 72%.  RESPIRATORY DATA: The patient was found to have 32 apneas and 3 obstructive hypopneas, giving him an AHI of only 7 events per hour.  The events occurred more frequently in the supine position, and there was moderate snoring noted throughout. The patient did not meet split-night protocol secondary to the majority of his events occurring after 3 AM.  OXYGEN DATA: There was oxygen desaturation as low as 90% with the patient's obstructive events  CARDIAC DATA: Occasional PVC noted  MOVEMENT/PARASOMNIA: The patient was found to have 112 periodic limb movements, with 7 resulting in arousal or awakening. There were no abnormal behaviors seen.  IMPRESSION/ RECOMMENDATION:    1) very mild obstructive sleep apnea/hypopnea syndrome, with an AHI of only 7 events per hour and oxygen desaturation transiently as low as 90%. Treatment for this degree of sleep apnea can include a trial of modest weight loss, upper airway surgery, dental appliance, or CPAP. Clinical correlation is suggested. The decision to treat very mild sleep apnea aggressively should depend upon its impact to his quality of life.   2) occasional PVC noted.  3) large numbers of  periodic limb movements with what appears to be significant sleep disruption. It is unclear whether this may represent a movement disorder of sleep, or related to other entities such as chronic pain or neuropathy. Clinical correlation is suggested.     Kathee Delton Diplomate, American Board of Sleep Medicine  ELECTRONICALLY SIGNED ON:  04/17/2014, 9:41 AM [place] PH: [phone]   FX: [phone] [place]

## 2014-04-18 ENCOUNTER — Encounter: Payer: Self-pay | Admitting: Physician Assistant

## 2014-04-18 ENCOUNTER — Ambulatory Visit (INDEPENDENT_AMBULATORY_CARE_PROVIDER_SITE_OTHER): Payer: Medicare Other | Admitting: Physician Assistant

## 2014-04-18 VITALS — BP 124/72 | HR 68 | Temp 98.4°F | Resp 18 | Wt 159.0 lb

## 2014-04-18 DIAGNOSIS — I498 Other specified cardiac arrhythmias: Secondary | ICD-10-CM | POA: Diagnosis not present

## 2014-04-18 DIAGNOSIS — R5381 Other malaise: Secondary | ICD-10-CM | POA: Diagnosis not present

## 2014-04-18 DIAGNOSIS — I2510 Atherosclerotic heart disease of native coronary artery without angina pectoris: Secondary | ICD-10-CM

## 2014-04-18 DIAGNOSIS — R001 Bradycardia, unspecified: Secondary | ICD-10-CM

## 2014-04-18 DIAGNOSIS — R5383 Other fatigue: Principal | ICD-10-CM

## 2014-04-18 DIAGNOSIS — B370 Candidal stomatitis: Secondary | ICD-10-CM

## 2014-04-18 NOTE — Progress Notes (Signed)
Pre visit review using our clinic review tool, if applicable. No additional management support is needed unless otherwise documented below in the visit note. 

## 2014-04-18 NOTE — Patient Instructions (Signed)
Continue current medications as directed, and maintain follow up with your specialists.  If emergency symptoms discussed during visit developed, seek medical attention immediately.  Followup as needed, or for worsening or persistent symptoms despite treatment.      Thrush, Adult  Ryan [name] is an infection that can happen on the mouth, throat, tongue, or other areas. It causes white patches to form on the mouth and tongue. HOME CARE  Only take medicine as told by your doctor. You may be given medicine to swallow or to apply right on the area.  Eat plain yogurt that contains live cultures (check the label).  Rinse your mouth many times a day with a warm saltwater rinse. To make the rinse, mix 1 teaspoon (6 g) of salt in 8 ounces (0.2 L) of warm water. To reduce pain:  Drink cold liquids such as water or iced tea.  Eat frozen ice pops or frozen juices.  Eat foods that are easy to swallow, such as gelatin or ice cream.  Drink from a straw if the patches are painful. If you are breastfeeding:  Clean your nipples with an antifungal medicine.  Dry your nipples after breastfeeding.  Use an ointment called lanolin to help relieve nipple soreness. If you wear dentures:  Take out your dentures before going to bed.  Brush them thoroughly.  Soak them in a denture cleaner. GET HELP IF:   Your problems are getting worse.  Your problems are not improving within 7 days of starting treatment.  Your infection is spreading. This may show as white patches on the skin outside of your mouth.  You are nursing and have redness and pain in the nipples. MAKE SURE YOU:  Understand these instructions.  Will watch your condition.  Will get help right away if you are not doing well or get worse. Document Released: 12/16/2009 Document Revised: 07/12/2013 Document Reviewed: 04/24/2013 [facility] Patient Information 2015 [place], [place]. This information is not intended to replace advice given to  you by your health care provider. Make sure you discuss any questions you have with your health care provider.

## 2014-04-18 NOTE — Progress Notes (Signed)
Subjective:    Patient ID: Ryan [name], male    DOB: Jun 15, 1946, 68 y.o.   MRN: [id number]  HPI Patient is a 68 y.o. male presenting for follow up of bradycardia and thrush. Pt was initially seen 4 weeks ago after [facility] and Rx magic mouthwash. While there, he was found to have fatigue/weakness and bradycardia, and encouraged to follow up with his cardiologist as he was on metoprolol. He saw primary care that day for the symptoms of weakness/fatigue/bradycardia, and pt strongly believed that this was due to his metoprolol, as symptom onset was with starting the metoprolol. Metoprolol dose was halved, and pt instructed to call cardiologist on recommendation. Cardiology recommended pt discontinue metoprolol. Pt was also scheduled for sleep study by cardiologist, and had this done recently. Pt has follow up with cardiology for this next week. Pt today states that he is feeling much better, and that his thrush symptoms have also resolved. He has a periodontist appointment coming up in two weeks, and he will be taking amoxicillin prior to this appointment, and he wants to make sure that he will not develop thrush again. He denies any mouth pain, fevers, chills, nausea, vomiting, diarrhea, SOB, chest pain, headache, syncope.    Review of Systems As per HPI and are otherwise negative.     Past Medical History  Diagnosis Date  . H/O: rheumatic fever   . Kidney stones   . GERD (gastroesophageal reflux disease)   . Heart murmur   . Anemia   . Hypertension   . Macular degeneration   . Hyperlipidemia   . History of colonoscopy   . Heart murmur   . COPD (chronic obstructive pulmonary disease)   . Atrial flutter 03/31/2012    converted spontaneously to sinus  . High risk medication use     on amiodarone since 03/31/2012  . CAD (coronary artery disease)     reportedly moderate CAD; managed medically    History   Social History  . Marital Status:  Married    Spouse Name: N/A    Number of Children: 0  . Years of Education: N/A   Occupational History  . retired    Social History Main Topics  . Smoking status: Former Smoker -- 0.50 packs/day for 50 years    Types: Cigarettes    Quit date: 05/01/2013  . Smokeless tobacco: Former Systems developer     Comment: using e-Cig//ldc  . Alcohol Use: No  . Drug Use: No  . Sexual Activity: Yes   Other Topics Concern  . Not on file   Social History Narrative  . No narrative on file    Past Surgical History  Procedure Laterality Date  . Cervical discectomy    . Cholecystectomy    . Mouth surgery    . Knee cartiledge    . Foot surgery    . Appendectomy    . Cardiac catheterization  04/22/2011    moderate left main and RCA stenosis not significant by FFR and IVUS on medical therapy    Family History  Problem Relation Age of Onset  . Liver disease    . Prostate cancer    . Coronary artery disease    . Heart disease Mother   . Diabetes Mother   . Cirrhosis Mother   . Heart disease Father   . Cancer Father     prostate  . Heart disease Sister   . Arthritis Sister   .  Heart disease Brother   . Emphysema Mother     never smoked but 2nd hand through her spouse    Allergies  Allergen Reactions  . Polymyxin B-Trimethoprim Swelling    Eye drops made eyes swell  . Atorvastatin Other (See Comments)    Muscle aches and cramps  . Avelox [Moxifloxacin] Other (See Comments)    Stomach cramps  . Mucinex D [Pseudoephedrine-Guaifenesin Er] Nausea And Vomiting    Stomach cramps  . Simvastatin Other (See Comments)    Muscle aches and cramps  . Vioxx [Rofecoxib] Other (See Comments)    Stomach cramping  . Ciprofloxacin Itching and Rash  . Codeine Phosphate Itching and Rash    Current Outpatient Prescriptions on File Prior to Visit  Medication Sig Dispense Refill  . amoxicillin (AMOXIL) 500 MG capsule Take 2,000 mg by mouth See admin instructions. Take 4 capsules (2000 mg) prior to dental  appointment      . apixaban (ELIQUIS) 5 MG TABS tablet Take 1 tablet (5 mg total) by mouth 2 (two) times daily.  60 tablet  6  . clobetasol (TEMOVATE) 0.05 % external solution Apply 1 application topically 2 (two) times daily as needed (rosacea).       . ezetimibe (ZETIA) 10 MG tablet Take 10 mg by mouth daily with supper.      . lubiprostone (AMITIZA) 8 MCG capsule Take 1-2 tablets a day      . Multiple Vitamins-Minerals (ICAPS MV) TABS Take 1 tablet by mouth 2 (two) times daily.       Marland Kitchen omeprazole (PRILOSEC OTC) 20 MG tablet Take 20 mg by mouth daily.       No current facility-administered medications on file prior to visit.    EXAM: BP 124/72  Pulse 68  Temp(Src) 98.4 F (36.9 C) (Oral)  Resp 18  Wt 159 lb (72.122 kg)  SpO2 98%     Objective:   Physical Exam  Nursing note and vitals reviewed. Constitutional: He is oriented to person, place, and time. He appears well-developed and well-nourished. No distress.  HENT:  Head: Normocephalic and atraumatic.  Right Ear: External ear normal.  Left Ear: External ear normal.  Nose: Nose normal.  Mouth/Throat: Oropharynx is clear and moist. No oropharyngeal exudate.  Eyes: Conjunctivae and EOM are normal. Pupils are equal, round, and reactive to light.  Neck: Normal range of motion. Neck supple.  Cardiovascular: Normal rate, regular rhythm and intact distal pulses.   Pulmonary/Chest: Effort normal and breath sounds normal. No respiratory distress. He exhibits no tenderness.  Musculoskeletal: Normal range of motion.  Neurological: He is alert and oriented to person, place, and time.  Skin: Skin is warm and dry. No rash noted. He is not diaphoretic. No erythema. No pallor.  Psychiatric: He has a normal mood and affect. His behavior is normal. Judgment and thought content normal.    Lab Results  Component Value Date   WBC 6.4 02/26/2014   HGB 12.3* 02/26/2014   HCT 35.4* 02/26/2014   PLT 165 02/26/2014   GLUCOSE 90 02/26/2014   CHOL  204* 12/01/2013   TRIG 61.0 12/01/2013   HDL 51.50 12/01/2013   LDLDIRECT 134.2 12/01/2013   LDLCALC 138* 11/07/2012   ALT 10 02/26/2014   AST 15 02/26/2014   NA 141 02/26/2014   K 4.3 02/26/2014   CL 106 02/26/2014   CREATININE 0.99 02/26/2014   BUN 13 02/26/2014   CO2 25 02/26/2014   TSH 2.280 02/26/2014   PSA 1.37  12/01/2013   INR 1.17 04/01/2012         Assessment & Plan:  Yue was seen today for follow-up.  Diagnoses and associated orders for this visit:  Other malaise and fatigue Comments: Resolving, pt no longer brady, still believes this was a major cause. Also awaiting sleep study results, has F/u with cardiology next week.  Bradycardia Comments: Resolved. Metoprolol dc'd by cardiology. pt has f/u next week.  Thrush Comments: Resolved. Pt will call if this redevelopes with abx prophylaxis at periodontist.    Reassured pt that he is unlikely to develop thrush from a one time prophylactic dose of amoxicillin for dental procedures, however we would treat if this does develop. We may also need to evaluate for possible reasons for recent susceptibility to thrush if it does continue to be a problem.  Return precautions provided, and patient handout on thrush.  Plan to follow up as needed, or for worsening or persistent symptoms despite treatment.  Patient Instructions  Continue current medications as directed, and maintain follow up with your specialists.  If emergency symptoms discussed during visit developed, seek medical attention immediately.  Followup as needed, or for worsening or persistent symptoms despite treatment.

## 2014-04-26 ENCOUNTER — Encounter: Payer: Self-pay | Admitting: Internal Medicine

## 2014-04-26 ENCOUNTER — Ambulatory Visit (INDEPENDENT_AMBULATORY_CARE_PROVIDER_SITE_OTHER): Payer: Medicare Other | Admitting: Internal Medicine

## 2014-04-26 VITALS — BP 128/78 | HR 62 | Ht 64.0 in | Wt 157.8 lb

## 2014-04-26 DIAGNOSIS — I10 Essential (primary) hypertension: Secondary | ICD-10-CM

## 2014-04-26 DIAGNOSIS — R55 Syncope and collapse: Secondary | ICD-10-CM | POA: Diagnosis not present

## 2014-04-26 DIAGNOSIS — I2510 Atherosclerotic heart disease of native coronary artery without angina pectoris: Secondary | ICD-10-CM

## 2014-04-26 NOTE — Progress Notes (Signed)
HPI Ryan [name] returns today for followup. He is a pleasant 68 yo man with a h/o PAF, and syncope who passed out several months ago, likely due to autonomic dysfunction. He has had none since then although he does not problems with diarrhea and abdominal pain. No fever or chills or constipation. He has generalized fatigue and is sleepy. 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 Allergies  Allergen Reactions  . Polymyxin B-Trimethoprim Swelling    Eye drops made eyes swell  . Atorvastatin Other (See Comments)    Muscle aches and cramps  . Avelox [Moxifloxacin] Other (See Comments)    Stomach cramps  . Mucinex D [Pseudoephedrine-Guaifenesin Er] Nausea And Vomiting    Stomach cramps  . Polymyxin B Other (See Comments)    Swelling  . Simvastatin Other (See Comments)    Muscle aches and cramps  . Statins Other (See Comments)    Muscle aches  . Vioxx [Rofecoxib] Other (See Comments)    Stomach cramping  . Ciprofloxacin Itching and Rash  . Codeine Phosphate Itching and Rash     Current Outpatient Prescriptions  Medication Sig Dispense Refill  . amoxicillin (AMOXIL) 500 MG capsule Take 2,000 mg by mouth See admin instructions. Take 4 capsules (2000 mg) prior to dental appointment      . apixaban (ELIQUIS) 5 MG TABS tablet Take 1 tablet (5 mg total) by mouth 2 (two) times daily.  60 tablet  6  . clobetasol (TEMOVATE) 0.05 % external solution Apply 1 application topically 2 (two) times daily as needed (rosacea).       . ezetimibe (ZETIA) 10 MG tablet Take 10 mg by mouth daily with supper.      . lubiprostone (AMITIZA) 8 MCG capsule Take 1-2 tablets a day      . Multiple Vitamins-Minerals (ICAPS MV) TABS Take 1 tablet by mouth 2 (two) times daily.       Marland Kitchen omeprazole (PRILOSEC OTC) 20 MG tablet Take 20 mg by mouth daily.       No current facility-administered medications for this visit.     Past Medical History  Diagnosis Date  . H/O: rheumatic fever   . Kidney  stones   . GERD (gastroesophageal reflux disease)   . Heart murmur   . Anemia   . Hypertension   . Macular degeneration   . Hyperlipidemia   . History of colonoscopy   . Heart murmur   . COPD (chronic obstructive pulmonary disease)   . Atrial flutter 03/31/2012    converted spontaneously to sinus  . High risk medication use     on amiodarone since 03/31/2012  . CAD (coronary artery disease)     reportedly moderate CAD; managed medically    ROS:   All systems reviewed and negative except as noted in the HPI.   Past Surgical History  Procedure Laterality Date  . Cervical discectomy    . Cholecystectomy    . Mouth surgery    . Knee cartiledge    . Foot surgery    . Appendectomy    . Cardiac catheterization  04/22/2011    moderate left main and RCA stenosis not significant by FFR and IVUS on medical therapy     Family History  Problem Relation Age of Onset  . Liver disease    . Prostate cancer    . Coronary artery disease    . Heart disease Mother   . Diabetes Mother   .  Cirrhosis Mother   . Heart disease Father   . Cancer Father     prostate  . Heart disease Sister   . Arthritis Sister   . Heart disease Brother   . Emphysema Mother     never smoked but 2nd hand through her spouse     History   Social History  . Marital Status: Married    Spouse Name: N/A    Number of Children: 0  . Years of Education: N/A   Occupational History  . retired    Social History Main Topics  . Smoking status: Former Smoker -- 0.50 packs/day for 50 years    Types: Cigarettes    Quit date: 05/01/2013  . Smokeless tobacco: Former Systems developer     Comment: using e-Cig//ldc  . Alcohol Use: No  . Drug Use: No  . Sexual Activity: Yes   Other Topics Concern  . Not on file   Social History Narrative  . No narrative on file     BP 128/78  Pulse 62  Ht 5\' 4"  (1.626 m)  Wt 157 lb 12.8 oz (71.578 kg)  BMI 27.07 kg/m2  Physical Exam:  Well appearing 68 yo man, NAD HEENT:  Unremarkable Neck:  No JVD, no thyromegally Back:  No CVA tenderness Lungs:  Clear with no wheezes HEART:  Regular rate rhythm, no murmurs, no rubs, no clicks Abd:  soft, positive bowel sounds, no organomegally, no rebound, no guarding Ext:  2 plus pulses, no edema, no cyanosis, no clubbing Skin:  No rashes no nodules Neuro:  CN II through XII intact, motor grossly intact  EKG nsr  Assess/Plan:

## 2014-04-26 NOTE — Assessment & Plan Note (Signed)
His blood pressure is well controlled. He will continue his current meds. 

## 2014-04-26 NOTE — Assessment & Plan Note (Signed)
No recurrent episodes. I have asked him to lie down if he feels another spell coming on.

## 2014-04-26 NOTE — Patient Instructions (Signed)
Your physician wants you to follow-up in: 12 months with Dr. Taylor. You will receive a reminder letter in the mail two months in advance. If you don't receive a letter, please call our office to schedule the follow-up appointment.    

## 2014-04-30 DIAGNOSIS — R109 Unspecified abdominal pain: Secondary | ICD-10-CM | POA: Diagnosis not present

## 2014-04-30 DIAGNOSIS — K625 Hemorrhage of anus and rectum: Secondary | ICD-10-CM | POA: Diagnosis not present

## 2014-04-30 DIAGNOSIS — R634 Abnormal weight loss: Secondary | ICD-10-CM | POA: Diagnosis not present

## 2014-05-02 DIAGNOSIS — H43819 Vitreous degeneration, unspecified eye: Secondary | ICD-10-CM | POA: Diagnosis not present

## 2014-05-02 DIAGNOSIS — H353190 Nonexudative age-related macular degeneration, unspecified eye, stage unspecified: Secondary | ICD-10-CM | POA: Diagnosis not present

## 2014-05-02 DIAGNOSIS — H353290 Exudative age-related macular degeneration, unspecified eye, stage unspecified: Secondary | ICD-10-CM | POA: Diagnosis not present

## 2014-05-02 DIAGNOSIS — H2510 Age-related nuclear cataract, unspecified eye: Secondary | ICD-10-CM | POA: Diagnosis not present

## 2014-05-03 DIAGNOSIS — K5730 Diverticulosis of large intestine without perforation or abscess without bleeding: Secondary | ICD-10-CM | POA: Diagnosis not present

## 2014-05-03 DIAGNOSIS — K625 Hemorrhage of anus and rectum: Secondary | ICD-10-CM | POA: Diagnosis not present

## 2014-05-03 DIAGNOSIS — R634 Abnormal weight loss: Secondary | ICD-10-CM | POA: Diagnosis not present

## 2014-05-03 DIAGNOSIS — R109 Unspecified abdominal pain: Secondary | ICD-10-CM | POA: Diagnosis not present

## 2014-05-03 DIAGNOSIS — R1084 Generalized abdominal pain: Secondary | ICD-10-CM | POA: Diagnosis not present

## 2014-05-03 DIAGNOSIS — K921 Melena: Secondary | ICD-10-CM | POA: Diagnosis not present

## 2014-05-03 DIAGNOSIS — K648 Other hemorrhoids: Secondary | ICD-10-CM | POA: Diagnosis not present

## 2014-05-07 ENCOUNTER — Ambulatory Visit: Payer: Medicare Other | Admitting: Internal Medicine

## 2014-05-14 ENCOUNTER — Encounter: Payer: Self-pay | Admitting: Vascular Surgery

## 2014-05-15 ENCOUNTER — Ambulatory Visit (INDEPENDENT_AMBULATORY_CARE_PROVIDER_SITE_OTHER): Payer: Medicare Other | Admitting: Physician Assistant

## 2014-05-15 ENCOUNTER — Ambulatory Visit (INDEPENDENT_AMBULATORY_CARE_PROVIDER_SITE_OTHER): Payer: Medicare Other | Admitting: Vascular Surgery

## 2014-05-15 ENCOUNTER — Encounter: Payer: Self-pay | Admitting: Vascular Surgery

## 2014-05-15 ENCOUNTER — Encounter: Payer: Self-pay | Admitting: Physician Assistant

## 2014-05-15 VITALS — BP 132/67 | HR 70 | Ht 64.0 in | Wt 158.0 lb

## 2014-05-15 VITALS — BP 120/76 | HR 72 | Temp 98.6°F | Resp 18 | Wt 160.0 lb

## 2014-05-15 DIAGNOSIS — I2510 Atherosclerotic heart disease of native coronary artery without angina pectoris: Secondary | ICD-10-CM | POA: Diagnosis not present

## 2014-05-15 DIAGNOSIS — G8929 Other chronic pain: Secondary | ICD-10-CM

## 2014-05-15 DIAGNOSIS — R1013 Epigastric pain: Secondary | ICD-10-CM

## 2014-05-15 LAB — CBC WITH DIFFERENTIAL/PLATELET
Basophils Absolute: 0 10*3/uL (ref 0.0–0.1)
Basophils Relative: 0.5 % (ref 0.0–3.0)
Eosinophils Absolute: 0.1 10*3/uL (ref 0.0–0.7)
Eosinophils Relative: 1.3 % (ref 0.0–5.0)
HCT: 34.7 % — ABNORMAL LOW (ref 39.0–52.0)
Hemoglobin: 11.8 g/dL — ABNORMAL LOW (ref 13.0–17.0)
Lymphocytes Relative: 22.4 % (ref 12.0–46.0)
Lymphs Abs: 1.7 10*3/uL (ref 0.7–4.0)
MCHC: 34.1 g/dL (ref 30.0–36.0)
MCV: 90.5 fl (ref 78.0–100.0)
Monocytes Absolute: 0.8 10*3/uL (ref 0.1–1.0)
Monocytes Relative: 10.7 % (ref 3.0–12.0)
Neutro Abs: 4.8 10*3/uL (ref 1.4–7.7)
Neutrophils Relative %: 65.1 % (ref 43.0–77.0)
Platelets: 214 10*3/uL (ref 150.0–400.0)
RBC: 3.83 Mil/uL — ABNORMAL LOW (ref 4.22–5.81)
RDW: 12.9 % (ref 11.5–15.5)
WBC: 7.4 10*3/uL (ref 4.0–10.5)

## 2014-05-15 LAB — H. PYLORI ANTIBODY, IGG: H Pylori IgG: NEGATIVE

## 2014-05-15 LAB — BASIC METABOLIC PANEL
BUN: 10 mg/dL (ref 6–23)
CO2: 23 mEq/L (ref 19–32)
Calcium: 8.8 mg/dL (ref 8.4–10.5)
Chloride: 103 mEq/L (ref 96–112)
Creatinine, Ser: 0.8 mg/dL (ref 0.4–1.5)
GFR: 98.12 mL/min (ref >60.00)
Glucose, Bld: 90 mg/dL (ref 70–99)
Potassium: 4.3 mEq/L (ref 3.5–5.1)
Sodium: 139 mEq/L (ref 135–145)

## 2014-05-15 LAB — HEPATIC FUNCTION PANEL
ALT: 13 U/L (ref 0–53)
AST: 19 U/L (ref 0–37)
Albumin: 3.4 g/dL — ABNORMAL LOW (ref 3.5–5.2)
Alkaline Phosphatase: 55 U/L (ref 39–117)
Bilirubin, Direct: 0.2 mg/dL (ref 0.0–0.3)
Total Bilirubin: 1.1 mg/dL (ref 0.2–1.2)
Total Protein: 6.9 g/dL (ref 6.0–8.3)

## 2014-05-15 LAB — LIPASE: Lipase: 24 U/L (ref 11.0–59.0)

## 2014-05-15 NOTE — Patient Instructions (Addendum)
We will call you with the results of your blood work when available.  Our plan will depend on what we find in the blood work.  Please have a copy of your GI records sent to [place].  If emergency symptoms discussed during visit developed, seek medical attention immediately.  Followup as needed, or for worsening or persistent symptoms despite treatment.   Abdominal Pain Many things can cause belly (abdominal) pain. Most times, the belly pain is not dangerous. Many cases of belly pain can be watched and treated at home. HOME CARE   Do not take medicines that help you go poop (laxatives) unless told to by your doctor.  Only take medicine as told by your doctor.  Eat or drink as told by your doctor. Your doctor will tell you if you should be on a special diet. GET HELP IF:  You do not know what is causing your belly pain.  You have belly pain while you are sick to your stomach (nauseous) or have runny poop (diarrhea).  You have pain while you pee or poop.  Your belly pain wakes you up at night.  You have belly pain that gets worse or better when you eat.  You have belly pain that gets worse when you eat fatty foods.  You have a fever. GET HELP RIGHT AWAY IF:   The pain does not go away within 2 hours.  You keep throwing up (vomiting).  The pain changes and is only in the right or left part of the belly.  You have bloody or tarry looking poop. MAKE SURE YOU:   Understand these instructions.  Will watch your condition.  Will get help right away if you are not doing well or get worse. Document Released: 03/09/2008 Document Revised: 09/26/2013 Document Reviewed: 05/31/2013 [facility] Patient Information 2015 [place], [place]. This information is not intended to replace advice given to you by your health care provider. Make sure you discuss any questions you have with your health care provider.

## 2014-05-15 NOTE — Progress Notes (Signed)
Patient name: Ryan [name] MRN: [id number] DOB: [birth year] Sex: male   Referred by: Medoff  Reason for referral:  Chief Complaint  Patient presents with  . New Evaluation    abd pain, mild narrowing of proximal SMA    HISTORY OF PRESENT ILLNESS: Patient is a 68 year old gentleman who presented for evaluation of abdominal pain. He reports that he was completely well up until May 24 of this year with second episode of atrial fibrillation. He had sudden onset of abdominal pain and has had persistent abdominal pain since this specific event. He completely denies any postprandial pain or chronic abdominal pain prior to this event. He does have a long history of internal hemorrhoids but no other GI complaints. He has undergone CT angiogram his abdomen and pelvis is also undergoing upper and lower endoscopy with no findings specifically on endoscopy. He reports that he has constant pain in his abdomen this is made worse with eating. He does have epigastric tenderness as well. He reports that since this event he has had a complete loss of energy. He has difficulty sleeping and has been miserable since this event. He has no history of coronary artery disease or peripheral vascular occlusive disease.  Past Medical History  Diagnosis Date  . H/O: rheumatic fever   . Kidney stones   . GERD (gastroesophageal reflux disease)   . Heart murmur   . Anemia   . Hypertension   . Macular degeneration   . Hyperlipidemia   . History of colonoscopy   . Heart murmur   . COPD (chronic obstructive pulmonary disease)   . Atrial flutter 03/31/2012    converted spontaneously to sinus  . High risk medication use     on amiodarone since 03/31/2012  . CAD (coronary artery disease)     reportedly moderate CAD; managed medically    Past Surgical History  Procedure Laterality Date  . Cervical discectomy    . Cholecystectomy    . Mouth surgery    . Knee cartiledge    . Foot surgery    . Appendectomy     . Cardiac catheterization  04/22/2011    moderate left main and RCA stenosis not significant by FFR and IVUS on medical therapy    History   Social History  . Marital Status: Married    Spouse Name: N/A    Number of Children: 0  . Years of Education: N/A   Occupational History  . retired    Social History Main Topics  . Smoking status: Former Smoker -- 0.50 packs/day for 50 years    Types: Cigarettes    Quit date: 05/01/2013  . Smokeless tobacco: Former Systems developer     Comment: using e-Cig//ldc  . Alcohol Use: No  . Drug Use: No  . Sexual Activity: Yes   Other Topics Concern  . Not on file   Social History Narrative  . No narrative on file    Family History  Problem Relation Age of Onset  . Liver disease    . Prostate cancer    . Coronary artery disease    . Heart disease Mother   . Diabetes Mother   . Cirrhosis Mother   . Emphysema Mother     never smoked but 2nd hand through her spouse  . Hypertension Mother   . Heart disease Father   . Cancer Father     prostate  . Hyperlipidemia Father   . Hypertension Father   . Varicose  Veins Father   . Heart attack Father   . Peripheral vascular disease Father   . Heart disease Sister   . Arthritis Sister   . Hyperlipidemia Sister   . Heart disease Brother   . Hyperlipidemia Brother     Allergies as of 05/15/2014 - Review Complete 05/15/2014  Allergen Reaction Noted  . Polymyxin b-trimethoprim Swelling 01/28/2012  . Atorvastatin Other (See Comments) 03/21/2007  . Avelox [moxifloxacin] Other (See Comments) 02/25/2014  . Mucinex d [pseudoephedrine-guaifenesin er] Nausea And Vomiting 02/25/2014  . Polymyxin b Other (See Comments) 04/26/2014  . Simvastatin Other (See Comments) 03/21/2007  . Statins Other (See Comments) 04/26/2014  . Vioxx [rofecoxib] Other (See Comments) 02/25/2014  . Ciprofloxacin Itching and Rash 03/21/2007  . Codeine phosphate Itching and Rash 03/21/2007    Current Outpatient Prescriptions on  File Prior to Visit  Medication Sig Dispense Refill  . clobetasol (TEMOVATE) 0.05 % external solution Apply 1 application topically 2 (two) times daily as needed (rosacea).       . ezetimibe (ZETIA) 10 MG tablet Take 10 mg by mouth daily with supper.      . lubiprostone (AMITIZA) 8 MCG capsule Take 1-2 tablets a day      . Multiple Vitamins-Minerals (ICAPS MV) TABS Take 1 tablet by mouth 2 (two) times daily.       Marland Kitchen omeprazole (PRILOSEC OTC) 20 MG tablet Take 20 mg by mouth daily.      Marland Kitchen amoxicillin (AMOXIL) 500 MG capsule Take 2,000 mg by mouth See admin instructions. Take 4 capsules (2000 mg) prior to dental appointment      . apixaban (ELIQUIS) 5 MG TABS tablet Take 1 tablet (5 mg total) by mouth 2 (two) times daily.  60 tablet  6   No current facility-administered medications on file prior to visit.     REVIEW OF SYSTEMS:  Positives indicated with an "X"  CARDIOVASCULAR:  [ ]  chest pain   [ ]  chest pressure   [ ]  palpitations   [ ]  orthopnea   [ ]  dyspnea on exertion   [ ]  claudication   [ ]  rest pain   [ ]  DVT   [ ]  phlebitis PULMONARY:   [ ]  productive cough   [ ]  asthma   [ ]  wheezing NEUROLOGIC:   [ ]  weakness  [ ]  paresthesias  [ ]  aphasia  [ ]  amaurosis  [ ]  dizziness HEMATOLOGIC:   [ ]  bleeding problems   [ ]  clotting disorders MUSCULOSKELETAL:  [ ]  joint pain   [ ]  joint swelling GASTROINTESTINAL: [ ]   blood in stool  [ ]   hematemesis GENITOURINARY:  [ ]   dysuria  [ ]   hematuria PSYCHIATRIC:  [ ]  history of major depression INTEGUMENTARY:  [ ]  rashes  [ ]  ulcers CONSTITUTIONAL:  [ ]  fever   [ ]  chills  PHYSICAL EXAMINATION:  General: The patient is a well-nourished male, in no acute distress. Vital signs are BP 132/67  Pulse 70  Ht 5\' 4"  (1.626 m)  Wt 158 lb (71.668 kg)  BMI 27.11 kg/m2  SpO2 99% Pulmonary: There is a good air exchange bilaterally without wheezing or rales. Abdomen: No masses noted. Has had epigastric tenderness. Does not have any shake tenderness.  Does have hyperactive bowel sounds. Musculoskeletal: There are no major deformities.  There is no significant extremity pain. Neurologic: No focal weakness or paresthesias are detected, Skin: There are no ulcer or rashes noted. Psychiatric: The patient has normal affect.  Cardiovascular: There is a regular rate and rhythm without significant murmur appreciated. Carotid arteries without bruits bilaterally Pulse status: 2+ radial and 2+ femoral pulses bilaterally he has a 2+ left dorsalis pedis pulse and absent posterior tibial pulse on the left on the right he has 2+ posterior tibial and absent dorsalis pedis pulse   I reviewed his CT scan from May. I discussed this in great detail with the patient and his family present. He does have relatively large mesenteric vessels. 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 Due to the large caliber of his vessels the most stenotic portion of this is still 6 mm in diameter. No narrowing of the superior mesenteric artery or his inferior mesenteric artery. 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  Impression and Plan:  Sudden onset of abdominal pain which has been persistent since May 24 episode of atrial fibrillation. No evidence of embolus on CT scan. The patient and his wife are very frustrated that no ability to come to the diagnosis of his new event. I do not feel that he has any evidence of mesenteric ischemia. 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 His superior mesenteric and inferior mesenteric artery widely patent. Would not recommend any further evaluation of this. I have suggested potentially discuss this with Dr. Lovena Le since did come on with his episode of atrial fibrillation. Will see him again on an as-needed basis    Evyn Putzier [facility] Office:  [phone]

## 2014-05-15 NOTE — Progress Notes (Signed)
Subjective:    Patient ID: Ryan [name], male    DOB: 1946-05-30, 68 y.o.   MRN: [id number]  Abdominal Pain This is a chronic problem. The current episode started more than 1 month ago (3 months). The onset quality is gradual. The problem occurs intermittently. The problem has been gradually worsening. The pain is located in the epigastric region. The quality of the pain is a sensation of fullness and cramping (gnawing). The abdominal pain does not radiate. Associated symptoms include belching. Pertinent negatives include no anorexia, arthralgias, constipation, diarrhea, dysuria, fever, flatus, frequency, headaches, hematochezia, hematuria, melena, myalgias, nausea, vomiting or weight loss. The pain is aggravated by eating. The pain is relieved by nothing. He has tried proton pump inhibitors and antacids (takes prilosec twice daily for GERD.) for the symptoms. Improvement on treatment: antacids seemed to help. Prior diagnostic workup includes CT scan, lower endoscopy, upper endoscopy and GI consult. His past medical history is significant for abdominal surgery (appendectomy and gall bladder), gallstones and GERD. There is no history of colon cancer, Crohn's disease, irritable bowel syndrome, pancreatitis, PUD or ulcerative colitis.      Review of Systems  Constitutional: Positive for fatigue. Negative for fever, chills and weight loss.  Respiratory: Negative for cough and shortness of breath.   Cardiovascular: Negative for chest pain.  Gastrointestinal: Positive for abdominal pain and anal bleeding (history of hemorrhoids, which is the cause per GI.). Negative for nausea, vomiting, diarrhea, constipation, melena, hematochezia, abdominal distention, rectal pain, anorexia and flatus.  Genitourinary: Negative for dysuria, frequency and hematuria.  Musculoskeletal: Negative for arthralgias and myalgias.  Neurological: Negative for syncope and headaches.  All other systems reviewed and are  negative.    Past Medical History  Diagnosis Date  . H/O: rheumatic fever   . Kidney stones   . GERD (gastroesophageal reflux disease)   . Heart murmur   . Anemia   . Hypertension   . Macular degeneration   . Hyperlipidemia   . History of colonoscopy   . Heart murmur   . COPD (chronic obstructive pulmonary disease)   . Atrial flutter 03/31/2012    converted spontaneously to sinus  . High risk medication use     on amiodarone since 03/31/2012  . CAD (coronary artery disease)     reportedly moderate CAD; managed medically    History   Social History  . Marital Status: Married    Spouse Name: N/A    Number of Children: 0  . Years of Education: N/A   Occupational History  . retired    Social History Main Topics  . Smoking status: Former Smoker -- 0.50 packs/day for 50 years    Types: Cigarettes    Quit date: 05/01/2013  . Smokeless tobacco: Former Systems developer     Comment: using e-Cig//ldc  . Alcohol Use: No  . Drug Use: No  . Sexual Activity: Yes   Other Topics Concern  . Not on file   Social History Narrative  . No narrative on file    Past Surgical History  Procedure Laterality Date  . Cervical discectomy    . Cholecystectomy    . Mouth surgery    . Knee cartiledge    . Foot surgery    . Appendectomy    . Cardiac catheterization  04/22/2011    moderate left main and RCA stenosis not significant by FFR and IVUS on medical therapy    Family History  Problem Relation Age of Onset  . Liver  disease    . Prostate cancer    . Coronary artery disease    . Heart disease Mother   . Diabetes Mother   . Cirrhosis Mother   . Emphysema Mother     never smoked but 2nd hand through her spouse  . Hypertension Mother   . Heart disease Father   . Cancer Father     prostate  . Hyperlipidemia Father   . Hypertension Father   . Varicose Veins Father   . Heart attack Father   . Peripheral vascular disease Father   . Heart disease Sister   . Arthritis Sister   .  Hyperlipidemia Sister   . Heart disease Brother   . Hyperlipidemia Brother     Allergies  Allergen Reactions  . Polymyxin B-Trimethoprim Swelling    Eye drops made eyes swell  . Atorvastatin Other (See Comments)    Muscle aches and cramps  . Avelox [Moxifloxacin] Other (See Comments)    Stomach cramps  . Mucinex D [Pseudoephedrine-Guaifenesin Er] Nausea And Vomiting    Stomach cramps  . Polymyxin B Other (See Comments)    Swelling  . Simvastatin Other (See Comments)    Muscle aches and cramps  . Statins Other (See Comments)    Muscle aches  . Vioxx [Rofecoxib] Other (See Comments)    Stomach cramping  . Ciprofloxacin Itching and Rash  . Codeine Phosphate Itching and Rash    Current Outpatient Prescriptions on File Prior to Visit  Medication Sig Dispense Refill  . amoxicillin (AMOXIL) 500 MG capsule Take 2,000 mg by mouth See admin instructions. Take 4 capsules (2000 mg) prior to dental appointment      . apixaban (ELIQUIS) 5 MG TABS tablet Take 1 tablet (5 mg total) by mouth 2 (two) times daily.  60 tablet  6  . clobetasol (TEMOVATE) 0.05 % external solution Apply 1 application topically 2 (two) times daily as needed (rosacea).       . ezetimibe (ZETIA) 10 MG tablet Take 10 mg by mouth daily with supper.      . lubiprostone (AMITIZA) 8 MCG capsule Take 1-2 tablets a day      . Multiple Vitamins-Minerals (ICAPS MV) TABS Take 1 tablet by mouth 2 (two) times daily.       Marland Kitchen omeprazole (PRILOSEC OTC) 20 MG tablet Take 20 mg by mouth daily.      . Ranibizumab (LUCENTIS IO) Inject into the eye as needed.       No current facility-administered medications on file prior to visit.    EXAM: BP 120/76  Pulse 72  Temp(Src) 98.6 F (37 C) (Oral)  Resp 18  Wt 160 lb (72.576 kg)     Objective:   Physical Exam  Nursing note and vitals reviewed. Constitutional: He is oriented to person, place, and time. He appears well-developed and well-nourished. No distress.  HENT:  Head:  Normocephalic and atraumatic.  Eyes: Conjunctivae and EOM are normal. Pupils are equal, round, and reactive to light.  Cardiovascular: Normal rate, regular rhythm and intact distal pulses.   Pulmonary/Chest: Effort normal and breath sounds normal. No respiratory distress. He has no wheezes. He has no rales. He exhibits no tenderness.  Abdominal: Soft. Bowel sounds are normal. He exhibits no distension and no mass. There is tenderness (mild epigastric ttp.). There is no rebound and no guarding.  Musculoskeletal: Normal range of motion.  Neurological: He is alert and oriented to person, place, and time.  Skin: Skin is warm  and dry. No rash noted. He is not diaphoretic. No erythema. No pallor.  Psychiatric: He has a normal mood and affect. His behavior is normal. Judgment and thought content normal.     Lab Results  Component Value Date   WBC 6.4 02/26/2014   HGB 12.3* 02/26/2014   HCT 35.4* 02/26/2014   PLT 165 02/26/2014   GLUCOSE 90 02/26/2014   CHOL 204* 12/01/2013   TRIG 61.0 12/01/2013   HDL 51.50 12/01/2013   LDLDIRECT 134.2 12/01/2013   LDLCALC 138* 11/07/2012   ALT 10 02/26/2014   AST 15 02/26/2014   NA 141 02/26/2014   K 4.3 02/26/2014   CL 106 02/26/2014   CREATININE 0.99 02/26/2014   BUN 13 02/26/2014   CO2 25 02/26/2014   TSH 2.280 02/26/2014   PSA 1.37 12/01/2013   INR 1.17 04/01/2012        Assessment & Plan:  Jaquavian was seen today for abdominal pain.  Diagnoses and associated orders for this visit:  Abdominal pain, chronic, epigastric Comments: Need GI records. Will test blood for H pylori. Continue PPI.  - CBC with Differential - Basic Metabolic Panel - Hepatic Function Panel - H. pylori Antibody, IgG - Lipase   Cause of Abdominal pain difficult to determine. GI found nothing per pt with upper and lower endoscopy. Need records from GI. Vascular determined no vascular cause.  Return precautions provided, and patient handout on abdominal pain.  Plan to follow up as  needed, or for worsening or persistent symptoms despite treatment.  Patient Instructions  We will call you with the results of your blood work when available.  Our plan will depend on what we find in the blood work.  Please have a copy of your GI records sent to [place].  If emergency symptoms discussed during visit developed, seek medical attention immediately.  Followup as needed, or for worsening or persistent symptoms despite treatment.

## 2014-05-17 ENCOUNTER — Encounter: Payer: Self-pay | Admitting: Internal Medicine

## 2014-05-17 ENCOUNTER — Other Ambulatory Visit: Payer: Self-pay | Admitting: Physician Assistant

## 2014-05-17 ENCOUNTER — Other Ambulatory Visit (INDEPENDENT_AMBULATORY_CARE_PROVIDER_SITE_OTHER): Payer: Medicare Other

## 2014-05-17 DIAGNOSIS — D649 Anemia, unspecified: Secondary | ICD-10-CM

## 2014-05-17 NOTE — Patient Instructions (Signed)
, °

## 2014-05-18 ENCOUNTER — Telehealth: Payer: Self-pay | Admitting: Internal Medicine

## 2014-05-18 LAB — FERRITIN: Ferritin: 295.3 ng/mL (ref 22.0–322.0)

## 2014-05-18 LAB — IBC PANEL
Iron: 37 ug/dL — ABNORMAL LOW (ref 42–165)
Saturation Ratios: 15.3 % — ABNORMAL LOW (ref 20.0–50.0)
Transferrin: 172.9 mg/dL — ABNORMAL LOW (ref 212.0–360.0)

## 2014-05-18 NOTE — Telephone Encounter (Signed)
Noted  

## 2014-05-18 NOTE — Telephone Encounter (Signed)
Pt had labs on yesterday, and states forgot to make you aware that he was on amoxicillin due to a tooth he had pulled. Wife did not know if that could effect the labs results and wanting to make you aware.

## 2014-05-18 NOTE — Telephone Encounter (Signed)
Should not effect results.

## 2014-05-18 NOTE — Telephone Encounter (Signed)
FYI

## 2014-05-21 ENCOUNTER — Other Ambulatory Visit: Payer: Self-pay | Admitting: Physician Assistant

## 2014-05-21 DIAGNOSIS — D649 Anemia, unspecified: Secondary | ICD-10-CM

## 2014-05-22 ENCOUNTER — Ambulatory Visit (INDEPENDENT_AMBULATORY_CARE_PROVIDER_SITE_OTHER)
Admission: RE | Admit: 2014-05-22 | Discharge: 2014-05-22 | Disposition: A | Discharge status: Home / Self Care | Payer: Medicare Other | Source: Ambulatory Visit | Attending: Physician Assistant | Admitting: Physician Assistant

## 2014-05-22 ENCOUNTER — Other Ambulatory Visit: Payer: Medicare Other

## 2014-05-22 ENCOUNTER — Other Ambulatory Visit: Payer: Self-pay | Admitting: Internal Medicine

## 2014-05-22 DIAGNOSIS — J984 Other disorders of lung: Secondary | ICD-10-CM | POA: Diagnosis not present

## 2014-05-22 DIAGNOSIS — D649 Anemia, unspecified: Secondary | ICD-10-CM | POA: Diagnosis not present

## 2014-05-22 LAB — RETICULOCYTES
ABS Retic: 83.2 10*3/uL (ref 19.0–186.0)
RBC.: 3.78 MIL/uL — ABNORMAL LOW (ref 4.22–5.81)
Retic Ct Pct: 2.2 % (ref 0.4–2.3)

## 2014-05-28 ENCOUNTER — Other Ambulatory Visit: Payer: Self-pay | Admitting: Physician Assistant

## 2014-05-28 ENCOUNTER — Telehealth: Payer: Self-pay | Admitting: Internal Medicine

## 2014-05-28 DIAGNOSIS — D649 Anemia, unspecified: Secondary | ICD-10-CM

## 2014-05-28 NOTE — Telephone Encounter (Signed)
Pt would like blood work results. Pt saw PA

## 2014-05-28 NOTE — Telephone Encounter (Signed)
Spoke with pt regarding lab results. Advised pt that we still do not have a clear picture of his anemia. Advised pt that we may need him to come in for additional lab work. He is amenable to this. Told pt that I would have someone call him back to schedule a follow up appointment.

## 2014-05-28 NOTE — Telephone Encounter (Signed)
Please call pt per result note sent today.

## 2014-05-28 NOTE — Telephone Encounter (Signed)
Noted.  See result note.  

## 2014-05-29 ENCOUNTER — Telehealth: Payer: Self-pay | Admitting: Internal Medicine

## 2014-05-29 ENCOUNTER — Encounter: Payer: Medicare Other | Admitting: Vascular Surgery

## 2014-05-29 NOTE — Telephone Encounter (Signed)
Spoke with pt and appointments made.  Pt is aware.

## 2014-05-29 NOTE — Telephone Encounter (Signed)
See result note. Spoke with pt about lab results and pt is aware.  Pt also aware of appointments.

## 2014-05-29 NOTE — Telephone Encounter (Signed)
Pt returning your call

## 2014-05-29 NOTE — Telephone Encounter (Signed)
Left a message for return call.  

## 2014-06-17 ENCOUNTER — Other Ambulatory Visit: Payer: Self-pay | Admitting: Internal Medicine

## 2014-06-22 ENCOUNTER — Other Ambulatory Visit: Payer: Self-pay | Admitting: Physician Assistant

## 2014-06-22 ENCOUNTER — Other Ambulatory Visit (INDEPENDENT_AMBULATORY_CARE_PROVIDER_SITE_OTHER): Payer: Medicare Other

## 2014-06-22 DIAGNOSIS — D649 Anemia, unspecified: Secondary | ICD-10-CM | POA: Diagnosis not present

## 2014-06-22 LAB — CBC WITH DIFFERENTIAL/PLATELET
Basophils Absolute: 0 10*3/uL (ref 0.0–0.1)
Basophils Relative: 0.5 % (ref 0.0–3.0)
Eosinophils Absolute: 0.1 10*3/uL (ref 0.0–0.7)
Eosinophils Relative: 1.8 % (ref 0.0–5.0)
HCT: 37.8 % — ABNORMAL LOW (ref 39.0–52.0)
Hemoglobin: 12.6 g/dL — ABNORMAL LOW (ref 13.0–17.0)
Lymphocytes Relative: 25.2 % (ref 12.0–46.0)
Lymphs Abs: 1.8 10*3/uL (ref 0.7–4.0)
MCHC: 33.3 g/dL (ref 30.0–36.0)
MCV: 92.4 fl (ref 78.0–100.0)
Monocytes Absolute: 0.6 10*3/uL (ref 0.1–1.0)
Monocytes Relative: 8.7 % (ref 3.0–12.0)
Neutro Abs: 4.6 10*3/uL (ref 1.4–7.7)
Neutrophils Relative %: 63.8 % (ref 43.0–77.0)
Platelets: 188 10*3/uL (ref 150.0–400.0)
RBC: 4.09 Mil/uL — ABNORMAL LOW (ref 4.22–5.81)
RDW: 13.8 % (ref 11.5–15.5)
WBC: 7.2 10*3/uL (ref 4.0–10.5)

## 2014-06-23 LAB — RETICULOCYTES
ABS Retic: 46.1 10*3/uL (ref 19.0–186.0)
RBC.: 4.19 MIL/uL — ABNORMAL LOW (ref 4.22–5.81)
Retic Ct Pct: 1.1 % (ref 0.4–2.3)

## 2014-06-23 LAB — LACTATE DEHYDROGENASE: LDH: 149 U/L (ref 94–250)

## 2014-06-23 LAB — HAPTOGLOBIN: Haptoglobin: 123 mg/dL (ref 45–215)

## 2014-06-25 ENCOUNTER — Encounter: Payer: Self-pay | Admitting: Physician Assistant

## 2014-06-25 ENCOUNTER — Ambulatory Visit (INDEPENDENT_AMBULATORY_CARE_PROVIDER_SITE_OTHER): Payer: Medicare Other | Admitting: Physician Assistant

## 2014-06-25 VITALS — BP 132/86 | HR 60 | Temp 98.1°F | Resp 18 | Wt 161.8 lb

## 2014-06-25 DIAGNOSIS — R5383 Other fatigue: Secondary | ICD-10-CM | POA: Diagnosis not present

## 2014-06-25 DIAGNOSIS — I2510 Atherosclerotic heart disease of native coronary artery without angina pectoris: Secondary | ICD-10-CM | POA: Diagnosis not present

## 2014-06-25 DIAGNOSIS — R5381 Other malaise: Secondary | ICD-10-CM

## 2014-06-25 DIAGNOSIS — R1013 Epigastric pain: Secondary | ICD-10-CM

## 2014-06-25 DIAGNOSIS — Z23 Encounter for immunization: Secondary | ICD-10-CM | POA: Diagnosis not present

## 2014-06-25 DIAGNOSIS — G8929 Other chronic pain: Secondary | ICD-10-CM

## 2014-06-25 LAB — POC HEMOCCULT BLD/STL (HOME/3-CARD/SCREEN)
Card #2 Fecal Occult Blod, POC: POSITIVE
Card #3 Fecal Occult Blood, POC: POSITIVE
Fecal Occult Blood, POC: POSITIVE

## 2014-06-25 NOTE — Patient Instructions (Addendum)
Please keep your appointment with new PCP next month.  They will decide how to follow your blood count at that time.  If emergency symptoms discussed during visit developed, seek medical attention immediately.  Followup as needed, or for worsening or persistent symptoms despite treatment.    Fatigue Fatigue is a feeling of tiredness, lack of energy, lack of motivation, or feeling tired all the time. Having enough rest, good nutrition, and reducing stress will normally reduce fatigue. Consult your caregiver if it persists. The nature of your fatigue will help your caregiver to find out its cause. The treatment is based on the cause.  CAUSES  There are many causes for fatigue. Most of the time, fatigue can be traced to one or more of your habits or routines. Most causes fit into one or more of three general areas. They are: Lifestyle problems  Sleep disturbances.  Overwork.  Physical exertion.  Unhealthy habits.  Poor eating habits or eating disorders.  Alcohol and/or drug use .  Lack of proper nutrition (malnutrition). Psychological problems  Stress and/or anxiety problems.  Depression.  Grief.  Boredom. Medical Problems or Conditions  Anemia.  Pregnancy.  Thyroid gland problems.  Recovery from major surgery.  Continuous pain.  Emphysema or asthma that is not well controlled  Allergic conditions.  Diabetes.  Infections (such as mononucleosis).  Obesity.  Sleep disorders, such as sleep apnea.  Heart failure or other heart-related problems.  Cancer.  Kidney disease.  Liver disease.  Effects of certain medicines such as antihistamines, cough and cold remedies, prescription pain medicines, heart and blood pressure medicines, drugs used for treatment of cancer, and some antidepressants. SYMPTOMS  The symptoms of fatigue include:   Lack of energy.  Lack of drive (motivation).  Drowsiness.  Feeling of indifference to the surroundings. DIAGNOSIS   The details of how you feel help guide your caregiver in finding out what is causing the fatigue. You will be asked about your present and past health condition. It is important to review all medicines that you take, including prescription and non-prescription items. A thorough exam will be done. You will be questioned about your feelings, habits, and normal lifestyle. Your caregiver may suggest blood tests, urine tests, or other tests to look for common medical causes of fatigue.  TREATMENT  Fatigue is treated by correcting the underlying cause. For example, if you have continuous pain or depression, treating these causes will improve how you feel. Similarly, adjusting the dose of certain medicines will help in reducing fatigue.  HOME CARE INSTRUCTIONS   Try to get the required amount of good sleep every night.  Eat a healthy and nutritious diet, and drink enough water throughout the day.  Practice ways of relaxing (including yoga or meditation).  Exercise regularly.  Make plans to change situations that cause stress. Act on those plans so that stresses decrease over time. Keep your work and personal routine reasonable.  Avoid street drugs and minimize use of alcohol.  Start taking a daily multivitamin after consulting your caregiver. SEEK MEDICAL CARE IF:   You have persistent tiredness, which cannot be accounted for.  You have fever.  You have unintentional weight loss.  You have headaches.  You have disturbed sleep throughout the night.  You are feeling sad.  You have constipation.  You have dry skin.  You have gained weight.  You are taking any new or different medicines that you suspect are causing fatigue.  You are unable to sleep at night.  You develop any unusual swelling of your legs or other parts of your body. SEEK IMMEDIATE MEDICAL CARE IF:   You are feeling confused.  Your vision is blurred.  You feel faint or pass out.  You develop severe  headache.  You develop severe abdominal, pelvic, or back pain.  You develop chest pain, shortness of breath, or an irregular or fast heartbeat.  You are unable to pass a normal amount of urine.  You develop abnormal bleeding such as bleeding from the rectum or you vomit blood.  You have thoughts about harming yourself or committing suicide.  You are worried that you might harm someone else. MAKE SURE YOU:   Understand these instructions.  Will watch your condition.  Will get help right away if you are not doing well or get worse. Document Released: 07/19/2007 Document Revised: 12/14/2011 Document Reviewed: 01/23/2014 [facility] Patient Information 2015 [place], [place]. This information is not intended to replace advice given to you by your health care provider. Make sure you discuss any questions you have with your health care provider.

## 2014-06-25 NOTE — Progress Notes (Signed)
Subjective:    Patient ID: Ryan [name], male    DOB: 07/12/1946, 68 y.o.   MRN: [id number]  HPI Patient is a 68 y.o. male presenting for follow up for abdominal pain and fatigue. Pt states that he saw a dentist last month following his OV here. He states that his dentist found severely infected molar, and removed this, and he was placed on a weeklong course of amoxicillin. He states that ever since this, he has begun feeling much better, and believes that he is greatly improved. He states that he has a follow up visit with a periodontist next week to check for further infection. Also, the pt states that he has begun to eat red meat again, and believes that he is feeling better on this. His Hemoglobin was 11.8 about 1 months ago, and last week it had increased to 12.6. Patient denies fevers, chills, nausea, vomiting, diarrhea, shortness of breath, chest pain, headache, syncope.  Pt 3 card home hemoccult study was positive for blood, however he has had a recent complete GI workup about 2 months ago, and was told that his bleeding is hemorrhoid related, and that he may always have blood in his stool on the hemoccult cards.   Review of Systems As per HPI and are otherwise negative.   Past Medical History  Diagnosis Date  . H/O: rheumatic fever   . Kidney stones   . GERD (gastroesophageal reflux disease)   . Heart murmur   . Anemia   . Hypertension   . Macular degeneration   . Hyperlipidemia   . History of colonoscopy   . Heart murmur   . COPD (chronic obstructive pulmonary disease)   . Atrial flutter 03/31/2012    converted spontaneously to sinus  . High risk medication use     on amiodarone since 03/31/2012  . CAD (coronary artery disease)     reportedly moderate CAD; managed medically    History   Social History  . Marital Status: Married    Spouse Name: N/A    Number of Children: 0  . Years of Education: N/A   Occupational History  . retired    Social History Main  Topics  . Smoking status: Former Smoker -- 0.50 packs/day for 50 years    Types: Cigarettes    Quit date: 05/01/2013  . Smokeless tobacco: Former Systems developer     Comment: using e-Cig//ldc  . Alcohol Use: No  . Drug Use: No  . Sexual Activity: Yes   Other Topics Concern  . Not on file   Social History Narrative  . No narrative on file    Past Surgical History  Procedure Laterality Date  . Cervical discectomy    . Cholecystectomy    . Mouth surgery    . Knee cartiledge    . Foot surgery    . Appendectomy    . Cardiac catheterization  04/22/2011    moderate left main and RCA stenosis not significant by FFR and IVUS on medical therapy    Family History  Problem Relation Age of Onset  . Liver disease    . Prostate cancer    . Coronary artery disease    . Heart disease Mother   . Diabetes Mother   . Cirrhosis Mother   . Emphysema Mother     never smoked but 2nd hand through her spouse  . Hypertension Mother   . Heart disease Father   . Cancer Father     prostate  .  Hyperlipidemia Father   . Hypertension Father   . Varicose Veins Father   . Heart attack Father   . Peripheral vascular disease Father   . Heart disease Sister   . Arthritis Sister   . Hyperlipidemia Sister   . Heart disease Brother   . Hyperlipidemia Brother     Allergies  Allergen Reactions  . Polymyxin B-Trimethoprim Swelling    Eye drops made eyes swell  . Atorvastatin Other (See Comments)    Muscle aches and cramps  . Avelox [Moxifloxacin] Other (See Comments)    Stomach cramps  . Mucinex D [Pseudoephedrine-Guaifenesin Er] Nausea And Vomiting    Stomach cramps  . Polymyxin B Other (See Comments)    Swelling  . Simvastatin Other (See Comments)    Muscle aches and cramps  . Statins Other (See Comments)    Muscle aches  . Vioxx [Rofecoxib] Other (See Comments)    Stomach cramping  . Ciprofloxacin Itching and Rash  . Codeine Phosphate Itching and Rash    Current Outpatient Prescriptions on  File Prior to Visit  Medication Sig Dispense Refill  . apixaban (ELIQUIS) 5 MG TABS tablet Take 1 tablet (5 mg total) by mouth 2 (two) times daily.  60 tablet  6  . clobetasol (TEMOVATE) 0.05 % external solution APPLY TWICE A DAY  50 mL  2  . ezetimibe (ZETIA) 10 MG tablet Take 10 mg by mouth daily with supper.      . lubiprostone (AMITIZA) 8 MCG capsule Take 1-2 tablets a day      . Multiple Vitamins-Minerals (ICAPS MV) TABS Take 1 tablet by mouth 2 (two) times daily.       Marland Kitchen omeprazole (PRILOSEC OTC) 20 MG tablet Take 20 mg by mouth daily.      . Ranibizumab (LUCENTIS IO) Inject into the eye as needed.      Marland Kitchen amoxicillin (AMOXIL) 500 MG capsule Take 2,000 mg by mouth See admin instructions. Take 4 capsules (2000 mg) prior to dental appointment       No current facility-administered medications on file prior to visit.    EXAM: BP 132/86  Pulse 60  Temp(Src) 98.1 F (36.7 C) (Oral)  Resp 18  Wt 161 lb 12.8 oz (73.392 kg)     Objective:   Physical Exam  Nursing note and vitals reviewed. Constitutional: He is oriented to person, place, and time. He appears well-developed and well-nourished. No distress.  HENT:  Head: Normocephalic and atraumatic.  Eyes: Conjunctivae and EOM are normal.  Cardiovascular: Normal rate, regular rhythm and intact distal pulses.   Pulmonary/Chest: Effort normal and breath sounds normal. No respiratory distress. He has no wheezes. He has no rales. He exhibits no tenderness.  Abdominal: Soft. Bowel sounds are normal. He exhibits no distension and no mass. There is no tenderness. There is no rebound and no guarding.  Neurological: He is alert and oriented to person, place, and time.  Skin: Skin is warm and dry. No rash noted. He is not diaphoretic. No erythema. No pallor.  Psychiatric: He has a normal mood and affect. His behavior is normal. Judgment and thought content normal.    Lab Results  Component Value Date   WBC 7.2 06/22/2014   HGB 12.6*  06/22/2014   HCT 37.8* 06/22/2014   PLT 188.0 06/22/2014   GLUCOSE 90 05/15/2014   CHOL 204* 12/01/2013   TRIG 61.0 12/01/2013   HDL 51.50 12/01/2013   LDLDIRECT 134.2 12/01/2013   LDLCALC 138* 11/07/2012  ALT 13 05/15/2014   AST 19 05/15/2014   NA 139 05/15/2014   K 4.3 05/15/2014   CL 103 05/15/2014   CREATININE 0.8 05/15/2014   BUN 10 05/15/2014   CO2 23 05/15/2014   TSH 2.280 02/26/2014   PSA 1.37 12/01/2013   INR 1.17 04/01/2012         Assessment & Plan:  Santhosh was seen today for follow-up.  Diagnoses and associated orders for this visit:  Need for prophylactic vaccination and inoculation against influenza - Flu Vaccine QUAD 36+ mos PF IM (Fluarix Quad PF)  Abdominal pain, chronic, epigastric Comments: Resolved. Continue to monitor symptoms.  Other malaise and fatigue Comments: Resolved, H and H are rising again. Pt has follow up scheduled to establish with new PCP next month.    Return precautions provided, and patient handout on fatigue.  Plan to follow up as needed, or for worsening or persistent symptoms despite treatment.  Patient Instructions  Please keep your appointment with new PCP next month.  They will decide how to follow your blood count at that time.  If emergency symptoms discussed during visit developed, seek medical attention immediately.  Followup as needed, or for worsening or persistent symptoms despite treatment.

## 2014-08-01 DIAGNOSIS — Z87891 Personal history of nicotine dependence: Secondary | ICD-10-CM | POA: Diagnosis not present

## 2014-08-01 DIAGNOSIS — H2513 Age-related nuclear cataract, bilateral: Secondary | ICD-10-CM | POA: Diagnosis not present

## 2014-08-01 DIAGNOSIS — H3532 Exudative age-related macular degeneration: Secondary | ICD-10-CM | POA: Diagnosis not present

## 2014-08-01 DIAGNOSIS — H3531 Nonexudative age-related macular degeneration: Secondary | ICD-10-CM | POA: Diagnosis not present

## 2014-08-01 DIAGNOSIS — H43812 Vitreous degeneration, left eye: Secondary | ICD-10-CM | POA: Diagnosis not present

## 2014-08-03 ENCOUNTER — Encounter: Payer: Self-pay | Admitting: Family Medicine

## 2014-08-03 ENCOUNTER — Ambulatory Visit (INDEPENDENT_AMBULATORY_CARE_PROVIDER_SITE_OTHER): Payer: Medicare Other | Admitting: Family Medicine

## 2014-08-03 VITALS — BP 130/82 | HR 99 | Temp 97.8°F | Ht 64.0 in | Wt 163.8 lb

## 2014-08-03 DIAGNOSIS — I2510 Atherosclerotic heart disease of native coronary artery without angina pectoris: Secondary | ICD-10-CM

## 2014-08-03 DIAGNOSIS — L309 Dermatitis, unspecified: Secondary | ICD-10-CM | POA: Diagnosis not present

## 2014-08-03 DIAGNOSIS — I10 Essential (primary) hypertension: Secondary | ICD-10-CM | POA: Diagnosis not present

## 2014-08-03 DIAGNOSIS — I4892 Unspecified atrial flutter: Secondary | ICD-10-CM

## 2014-08-03 DIAGNOSIS — E785 Hyperlipidemia, unspecified: Secondary | ICD-10-CM

## 2014-08-03 DIAGNOSIS — Z87891 Personal history of nicotine dependence: Secondary | ICD-10-CM

## 2014-08-03 DIAGNOSIS — K219 Gastro-esophageal reflux disease without esophagitis: Secondary | ICD-10-CM

## 2014-08-03 HISTORY — DX: Dermatitis, unspecified: L30.9

## 2014-08-03 NOTE — Progress Notes (Signed)
Pre visit review using our clinic review tool, if applicable. No additional management support is needed unless otherwise documented below in the visit note. 

## 2014-08-03 NOTE — Patient Instructions (Signed)

## 2014-08-04 ENCOUNTER — Encounter: Payer: Self-pay | Admitting: Family Medicine

## 2014-08-04 LAB — HEPATIC FUNCTION PANEL
ALT: 13 U/L (ref 0–53)
AST: 18 U/L (ref 0–37)
Albumin: 3.9 g/dL (ref 3.5–5.2)
Alkaline Phosphatase: 53 U/L (ref 39–117)
Bilirubin, Direct: 0.1 mg/dL (ref 0.0–0.3)
Indirect Bilirubin: 0.4 mg/dL (ref 0.2–1.2)
Total Bilirubin: 0.5 mg/dL (ref 0.2–1.2)
Total Protein: 6.6 g/dL (ref 6.0–8.3)

## 2014-08-04 LAB — RENAL FUNCTION PANEL
Albumin: 3.9 g/dL (ref 3.5–5.2)
BUN: 13 mg/dL (ref 6–23)
CO2: 26 mEq/L (ref 19–32)
Calcium: 9.2 mg/dL (ref 8.4–10.5)
Chloride: 103 mEq/L (ref 96–112)
Creat: 0.75 mg/dL (ref 0.50–1.35)
Glucose, Bld: 77 mg/dL (ref 70–99)
Phosphorus: 2.9 mg/dL (ref 2.3–4.6)
Potassium: 4.6 mEq/L (ref 3.5–5.3)
Sodium: 140 mEq/L (ref 135–145)

## 2014-08-04 LAB — LIPID PANEL
Cholesterol: 188 mg/dL (ref 0–200)
HDL: 44 mg/dL (ref >39)
LDL Cholesterol: 126 mg/dL — ABNORMAL HIGH (ref 0–99)
Total CHOL/HDL Ratio: 4.3 Ratio
Triglycerides: 90 mg/dL (ref <150)
VLDL: 18 mg/dL (ref 0–40)

## 2014-08-04 LAB — CBC
HCT: 39.1 % (ref 39.0–52.0)
Hemoglobin: 13.7 g/dL (ref 13.0–17.0)
MCH: 30.2 pg (ref 26.0–34.0)
MCHC: 35 g/dL (ref 30.0–36.0)
MCV: 86.3 fL (ref 78.0–100.0)
Platelets: 210 10*3/uL (ref 150–400)
RBC: 4.53 MIL/uL (ref 4.22–5.81)
RDW: 13.9 % (ref 11.5–15.5)
WBC: 8.5 10*3/uL (ref 4.0–10.5)

## 2014-08-04 LAB — TSH: TSH: 1.904 u[IU]/mL (ref 0.350–4.500)

## 2014-08-04 NOTE — Progress Notes (Signed)
Patient ID: Ryan [name], male   DOB: [birth year], 68 y.o.   MRN: [id number] Ryan [name] [id number] [birth year] 08/04/2014      Progress Note-Follow Up  Subjective  Chief Complaint  Chief Complaint  Patient presents with  . Establish Care    new patient/ transfer from Dr Arnoldo Morale    HPI  Patient is a 67 year old male in today for routine medical care. In today to establish care accompanied by his wife. His major concern today is persistent dental infections, headache, is under the care of his dentist and is improving. Did not tolerate Neurontin made him lethargic. Denies CP/palp/SOB/HA/congestion/fevers/GI or GU c/o. Taking meds as prescribed  Past Medical History  Diagnosis Date  . H/O: rheumatic fever   . Kidney stones   . GERD (gastroesophageal reflux disease)   . Heart murmur   . Anemia   . Hypertension   . Macular degeneration   . Hyperlipidemia   . History of colonoscopy   . Heart murmur   . COPD (chronic obstructive pulmonary disease)   . Atrial flutter 03/31/2012    converted spontaneously to sinus  . High risk medication use     on amiodarone since 03/31/2012  . CAD (coronary artery disease)     reportedly moderate CAD; managed medically  . Eczema 08/03/2014    Past Surgical History  Procedure Laterality Date  . Cervical discectomy      C5,6,7 disc fused  with plate and 5 1" screws  . Cholecystectomy    . Mouth surgery      periodontal surgery, bridges, splint in front,   . Knee cartiledge Right     right knee  . Foot surgery      right calcification removed from top of foot  . Appendectomy    . Cardiac catheterization  04/22/2011    moderate left main and RCA stenosis not significant by FFR and IVUS on medical therapy    Family History  Problem Relation Age of Onset  . Liver disease    . Prostate cancer    . Coronary artery disease    . Heart disease Mother   . Diabetes Mother   . Cirrhosis Mother   . Emphysema Mother     never smoked but  2nd hand through her spouse  . Hypertension Mother   . Macular degeneration Mother   . Heart disease Father   . Cancer Father     prostate  . Hyperlipidemia Father   . Hypertension Father   . Varicose Veins Father   . Heart attack Father   . Peripheral vascular disease Father   . Heart disease Sister   . Arthritis Sister   . Hyperlipidemia Sister   . Obesity Sister   . Macular degeneration Sister   . Heart disease Brother     5 stents  . Hyperlipidemia Brother   . Macular degeneration Maternal Grandfather   . Cirrhosis Sister   . Obesity Sister   . Arthritis Sister   . Heart disease Sister   . Obesity Sister     History   Social History  . Marital Status: Married    Spouse Name: N/A    Number of Children: 0  . Years of Education: N/A   Occupational History  . retired    Social History Main Topics  . Smoking status: Former Smoker -- 0.50 packs/day for 50 years    Types: Cigarettes    Quit date: 05/01/2013  . Smokeless  tobacco: Former Systems developer     Comment: using e-Cig//ldc  . Alcohol Use: No  . Drug Use: No  . Sexual Activity: Yes     Comment: lives with wife, no dietary restrictions   Other Topics Concern  . Not on file   Social History Narrative  . No narrative on file    Current Outpatient Prescriptions on File Prior to Visit  Medication Sig Dispense Refill  . apixaban (ELIQUIS) 5 MG TABS tablet Take 1 tablet (5 mg total) by mouth 2 (two) times daily.  60 tablet  6  . clobetasol (TEMOVATE) 0.05 % external solution APPLY TWICE A DAY  50 mL  2  . ezetimibe (ZETIA) 10 MG tablet Take 10 mg by mouth daily with supper.      . lubiprostone (AMITIZA) 8 MCG capsule Take 1-2 tablets a day      . Multiple Vitamins-Minerals (ICAPS MV) TABS Take 1 tablet by mouth 2 (two) times daily.       Marland Kitchen omeprazole (PRILOSEC OTC) 20 MG tablet Take 20 mg by mouth daily.      . Ranibizumab (LUCENTIS IO) Inject into the eye as needed.      Marland Kitchen amoxicillin (AMOXIL) 500 MG capsule Take  2,000 mg by mouth See admin instructions. Take 4 capsules (2000 mg) prior to dental appointment       No current facility-administered medications on file prior to visit.    Allergies  Allergen Reactions  . Polymyxin B-Trimethoprim Swelling    Eye drops made eyes swell  . Atorvastatin Other (See Comments)    Muscle aches and cramps  . Avelox [Moxifloxacin] Other (See Comments)    Stomach cramps  . Mucinex D [Pseudoephedrine-Guaifenesin Er] Nausea And Vomiting    Stomach cramps  . Polymyxin B Other (See Comments)    Swelling  . Simvastatin Other (See Comments)    Muscle aches and cramps  . Statins Other (See Comments)    Muscle aches  . Vioxx [Rofecoxib] Other (See Comments)    Stomach cramping  . Ciprofloxacin Itching and Rash  . Codeine Phosphate Itching and Rash    Review of Systems  Review of Systems  Constitutional: Positive for malaise/fatigue. Negative for fever and chills.  HENT: Negative for congestion, hearing loss and nosebleeds.   Eyes: Negative for discharge.  Respiratory: Negative for cough, sputum production, shortness of breath and wheezing.   Cardiovascular: Negative for chest pain, palpitations and leg swelling.  Gastrointestinal: Negative for heartburn, nausea, vomiting, abdominal pain, diarrhea, constipation and blood in stool.  Genitourinary: Negative for dysuria, urgency, frequency and hematuria.  Musculoskeletal: Negative for back pain, falls and myalgias.  Skin: Negative for rash.  Neurological: Positive for headaches. Negative for dizziness, tremors, sensory change, focal weakness, loss of consciousness and weakness.  Endo/Heme/Allergies: Negative for polydipsia. Does not bruise/bleed easily.  Psychiatric/Behavioral: Negative for depression and suicidal ideas. The patient is nervous/anxious. The patient does not have insomnia.     Objective  BP 130/82  Pulse 99  Temp(Src) 97.8 F (36.6 C) (Oral)  Ht 5\' 4"  (1.626 m)  Wt 163 lb 12.8 oz (74.299  kg)  BMI 28.10 kg/m2  SpO2 99%  Physical Exam  Physical Exam  Constitutional: He is oriented to person, place, and time and well-developed, well-nourished, and in no distress. No distress.  HENT:  Head: Normocephalic and atraumatic.  Eyes: Conjunctivae are normal.  Neck: Neck supple. No thyromegaly present.  Cardiovascular: Normal rate, regular rhythm and normal heart sounds.  No murmur heard. Pulmonary/Chest: Effort normal and breath sounds normal. No respiratory distress.  Abdominal: He exhibits no distension and no mass. There is no tenderness.  Musculoskeletal: He exhibits no edema.  Neurological: He is alert and oriented to person, place, and time.  Skin: Skin is warm.  Psychiatric: Memory, affect and judgment normal.    Lab Results  Component Value Date   TSH 2.280 02/26/2014   Lab Results  Component Value Date   WBC 7.2 06/22/2014   HGB 12.6* 06/22/2014   HCT 37.8* 06/22/2014   MCV 92.4 06/22/2014   PLT 188.0 06/22/2014   Lab Results  Component Value Date   CREATININE 0.8 05/15/2014   BUN 10 05/15/2014   NA 139 05/15/2014   K 4.3 05/15/2014   CL 103 05/15/2014   CO2 23 05/15/2014   Lab Results  Component Value Date   ALT 13 05/15/2014   AST 19 05/15/2014   ALKPHOS 55 05/15/2014   BILITOT 1.1 05/15/2014   Lab Results  Component Value Date   CHOL 204* 12/01/2013   Lab Results  Component Value Date   HDL 51.50 12/01/2013   Lab Results  Component Value Date   LDLCALC 138* 11/07/2012   Lab Results  Component Value Date   TRIG 61.0 12/01/2013   Lab Results  Component Value Date   CHOLHDL 4 12/01/2013     Assessment & Plan  Essential hypertension Well controlled, no changes to meds. Encouraged heart healthy diet such as the DASH diet and exercise as tolerated.   GERD Avoid offending foods, start probiotics. Do not eat large meals in late evening and consider raising head of bed.   Eczema Uses clobetasol follows with Dr Allyson Sabal  Atrial flutter with rapid  ventricular response Had an episode in May with syncope has not been driving since but is hoping to drive soon. Tolerating Eliquis, following with cardiology  H/O tobacco use, presenting hazards to health Continues to abstain

## 2014-08-04 NOTE — Assessment & Plan Note (Signed)
Had an episode in May with syncope has not been driving since but is hoping to drive soon. Tolerating Eliquis, following with cardiology

## 2014-08-04 NOTE — Assessment & Plan Note (Signed)
Well controlled, no changes to meds. Encouraged heart healthy diet such as the DASH diet and exercise as tolerated.  °

## 2014-08-04 NOTE — Assessment & Plan Note (Signed)
Continues to abstain

## 2014-08-04 NOTE — Assessment & Plan Note (Signed)
Avoid offending foods, start probiotics. Do not eat large meals in late evening and consider raising head of bed.  

## 2014-08-04 NOTE — Assessment & Plan Note (Signed)
Uses clobetasol follows with Dr Allyson Sabal

## 2014-08-24 DIAGNOSIS — M5136 Other intervertebral disc degeneration, lumbar region: Secondary | ICD-10-CM | POA: Diagnosis not present

## 2014-08-24 DIAGNOSIS — M4806 Spinal stenosis, lumbar region: Secondary | ICD-10-CM | POA: Diagnosis not present

## 2014-08-24 DIAGNOSIS — M47817 Spondylosis without myelopathy or radiculopathy, lumbosacral region: Secondary | ICD-10-CM | POA: Diagnosis not present

## 2014-09-03 ENCOUNTER — Ambulatory Visit: Payer: Medicare Other | Admitting: Internal Medicine

## 2014-09-04 ENCOUNTER — Encounter: Payer: Self-pay | Admitting: Internal Medicine

## 2014-09-04 ENCOUNTER — Ambulatory Visit (INDEPENDENT_AMBULATORY_CARE_PROVIDER_SITE_OTHER): Payer: Medicare Other | Admitting: Internal Medicine

## 2014-09-04 VITALS — BP 140/80 | HR 65 | Ht 64.0 in | Wt 159.6 lb

## 2014-09-04 DIAGNOSIS — R55 Syncope and collapse: Secondary | ICD-10-CM

## 2014-09-04 DIAGNOSIS — I2510 Atherosclerotic heart disease of native coronary artery without angina pectoris: Secondary | ICD-10-CM | POA: Diagnosis not present

## 2014-09-04 NOTE — Assessment & Plan Note (Signed)
I offered the patient insertion of an implantable loop recorder, that he has declined. I do suspect that his mechanism of syncope is due to autonomic dysfunction. He has been instructed on the warning signs and what to do if he feels another spell. He will undergo watchful waiting.

## 2014-09-04 NOTE — Assessment & Plan Note (Signed)
He denies anginal symptoms. He will continue his current medical therapy.

## 2014-09-04 NOTE — Patient Instructions (Signed)
Your physician wants you to follow-up in: 12 months with Dr. Taylor. You will receive a reminder letter in the mail two months in advance. If you don't receive a letter, please call our office to schedule the follow-up appointment.    

## 2014-09-04 NOTE — Progress Notes (Signed)
HPI Mr. Dillenburg returns today for followup. He is a pleasant 68 yo man with a h/o PAF, and syncope who passed out several months ago, likely due to autonomic dysfunction. He has undergone removal of 2 teeth, and his GI complaints have resolved. No fever or chills or constipation.  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 Overall, the patient has had no recurrent syncope, and denies palpitations or shortness of breath. No peripheral edema. He has been bothered by his anticoagulation, because he has easy bruisability on his forearms. Allergies  Allergen Reactions  . Polymyxin B-Trimethoprim Swelling    Eye drops made eyes swell  . Atorvastatin Other (See Comments)    Muscle aches and cramps  . Avelox [Moxifloxacin] Other (See Comments)    Stomach cramps  . Mucinex D [Pseudoephedrine-Guaifenesin Er] Nausea And Vomiting    Stomach cramps  . Polymyxin B Other (See Comments)    Swelling  . Simvastatin Other (See Comments)    Muscle aches and cramps  . Statins Other (See Comments)    Muscle aches  . Vioxx [Rofecoxib] Other (See Comments)    Stomach cramping  . Ciprofloxacin Itching and Rash  . Codeine Phosphate Itching and Rash     Current Outpatient Prescriptions  Medication Sig Dispense Refill  . apixaban (ELIQUIS) 5 MG TABS tablet Take 1 tablet (5 mg total) by mouth 2 (two) times daily. 60 tablet 6  . clobetasol (TEMOVATE) 0.05 % external solution APPLY TWICE A DAY (Patient taking differently: APPLY TOPICALLY TWICE A DAY AS NEEDED FOR DRY SPOTS ON FACE AND SCALP) 50 mL 2  . ezetimibe (ZETIA) 10 MG tablet Take 10 mg by mouth daily with supper.    . lubiprostone (AMITIZA) 8 MCG capsule Take 1-2 tablets a day    . Multiple Vitamins-Minerals (ICAPS MV) TABS Take 1 tablet by mouth 2 (two) times daily.     Marland Kitchen omeprazole (PRILOSEC OTC) 20 MG tablet Take 20 mg by mouth daily.    . Ranibizumab (LUCENTIS IO) Inject into the eye as needed (Macular degeneration).     Marland Kitchen  amoxicillin (AMOXIL) 500 MG capsule Take 2,000 mg by mouth See admin instructions. Take 4 capsules (2000 mg) prior to dental appointment     No current facility-administered medications for this visit.     Past Medical History  Diagnosis Date  . H/O: rheumatic fever   . Kidney stones   . GERD (gastroesophageal reflux disease)   . Heart murmur   . Anemia   . Hypertension   . Macular degeneration   . Hyperlipidemia   . History of colonoscopy   . Heart murmur   . COPD (chronic obstructive pulmonary disease)   . Atrial flutter 03/31/2012    converted spontaneously to sinus  . High risk medication use     on amiodarone since 03/31/2012  . CAD (coronary artery disease)     reportedly moderate CAD; managed medically  . Eczema 08/03/2014    ROS:   All systems reviewed and negative except as noted in the HPI.   Past Surgical History  Procedure Laterality Date  . Cervical discectomy      C5,6,7 disc fused  with plate and 5 1" screws  . Cholecystectomy    . Mouth surgery      periodontal surgery, bridges, splint in front,   . Knee cartiledge Right     right knee  . Foot surgery      right  calcification removed from top of foot  . Appendectomy    . Cardiac catheterization  04/22/2011    moderate left main and RCA stenosis not significant by FFR and IVUS on medical therapy     Family History  Problem Relation Age of Onset  . Liver disease    . Prostate cancer    . Coronary artery disease    . Heart disease Mother   . Diabetes Mother   . Cirrhosis Mother   . Emphysema Mother     never smoked but 2nd hand through her spouse  . Hypertension Mother   . Macular degeneration Mother   . Heart disease Father   . Cancer Father     prostate  . Hyperlipidemia Father   . Hypertension Father   . Varicose Veins Father   . Heart attack Father   . Peripheral vascular disease Father   . Heart disease Sister   . Arthritis Sister   . Hyperlipidemia Sister   . Obesity Sister   .  Macular degeneration Sister   . Heart disease Brother     5 stents  . Hyperlipidemia Brother   . Macular degeneration Maternal Grandfather   . Cirrhosis Sister   . Obesity Sister   . Arthritis Sister   . Heart disease Sister   . Obesity Sister      History   Social History  . Marital Status: Married    Spouse Name: N/A    Number of Children: 0  . Years of Education: N/A   Occupational History  . retired    Social History Main Topics  . Smoking status: Former Smoker -- 0.50 packs/day for 50 years    Types: Cigarettes    Quit date: 05/01/2013  . Smokeless tobacco: Former Systems developer     Comment: using e-Cig//ldc  . Alcohol Use: No  . Drug Use: No  . Sexual Activity: Yes     Comment: lives with wife, no dietary restrictions   Other Topics Concern  . Not on file   Social History Narrative     BP 140/80 mmHg  Pulse 65  Ht 5\' 4"  (1.626 m)  Wt 159 lb 9.6 oz (72.394 kg)  BMI 27.38 kg/m2  Physical Exam:  Well appearing 68 yo man, NAD HEENT: Unremarkable Neck:  No JVD, no thyromegally Back:  No CVA tenderness Lungs:  Clear with no wheezes HEART:  Regular rate rhythm, no murmurs, no rubs, no clicks Abd:  soft, positive bowel sounds, no organomegally, no rebound, no guarding Ext:  2 plus pulses, no edema, no cyanosis, no clubbing Skin:  No rashes no nodules Neuro:  CN II through XII intact, motor grossly intact  EKG nsr  Assess/Plan:

## 2014-09-07 ENCOUNTER — Other Ambulatory Visit: Payer: Self-pay

## 2014-09-07 MED ORDER — APIXABAN 5 MG PO TABS: 5.0000 mg | ORAL_TABLET | Freq: Two times a day (BID) | ORAL | Status: DC

## 2014-09-08 ENCOUNTER — Encounter: Payer: Self-pay | Admitting: *Deleted

## 2014-09-08 ENCOUNTER — Emergency Department (INDEPENDENT_AMBULATORY_CARE_PROVIDER_SITE_OTHER)
Admission: EM | Admit: 2014-09-08 | Discharge: 2014-09-08 | Disposition: A | Discharge status: Home / Self Care | Payer: Medicare Other | Source: Home / Self Care | Attending: Family Medicine | Admitting: Family Medicine

## 2014-09-08 DIAGNOSIS — J069 Acute upper respiratory infection, unspecified: Secondary | ICD-10-CM

## 2014-09-08 DIAGNOSIS — B9789 Other viral agents as the cause of diseases classified elsewhere: Principal | ICD-10-CM

## 2014-09-08 MED ORDER — AZITHROMYCIN 250 MG PO TABS: ORAL_TABLET | ORAL | Status: DC

## 2014-09-08 MED ORDER — BENZONATATE 200 MG PO CAPS: 200.0000 mg | ORAL_CAPSULE | Freq: Every day | ORAL | Status: DC

## 2014-09-08 NOTE — Discharge Instructions (Signed)
Increase fluid intake, rest. May use Afrin nasal spray (or generic oxymetazoline) twice daily for about 5 days.  Also recommend using saline nasal spray several times daily and saline nasal irrigation (AYR is a common brand) Try warm salt water gargles for sore throat.  Stop all antihistamines for now, and other non-prescription cough/cold preparations.  Follow-up with family doctor if not improving 7 to 10 days.

## 2014-09-08 NOTE — ED Provider Notes (Signed)
CSN: [id number]     Arrival date & time 09/08/14  1549 History   First MD Initiated Contact with Patient 09/08/14 1618     Chief Complaint  Patient presents with  . Nasal Congestion  . Cough     HPI Comments: Patient developed sinus congestion three days ago, followed by a sore throat, cough, and low grade fever.  The history is provided by the patient and the spouse.    Past Medical History  Diagnosis Date  . H/O: rheumatic fever   . Kidney stones   . GERD (gastroesophageal reflux disease)   . Heart murmur   . Anemia   . Hypertension   . Macular degeneration   . Hyperlipidemia   . History of colonoscopy   . Heart murmur   . COPD (chronic obstructive pulmonary disease)   . Atrial flutter 03/31/2012    converted spontaneously to sinus  . High risk medication use     on amiodarone since 03/31/2012  . CAD (coronary artery disease)     reportedly moderate CAD; managed medically  . Eczema 08/03/2014   Past Surgical History  Procedure Laterality Date  . Cervical discectomy      C5,6,7 disc fused  with plate and 5 1" screws  . Cholecystectomy    . Mouth surgery      periodontal surgery, bridges, splint in front,   . Knee cartiledge Right     right knee  . Foot surgery      right calcification removed from top of foot  . Appendectomy    . Cardiac catheterization  04/22/2011    moderate left main and RCA stenosis not significant by FFR and IVUS on medical therapy   Family History  Problem Relation Age of Onset  . Liver disease    . Prostate cancer    . Coronary artery disease    . Heart disease Mother   . Diabetes Mother   . Cirrhosis Mother   . Emphysema Mother     never smoked but 2nd hand through her spouse  . Hypertension Mother   . Macular degeneration Mother   . Heart disease Father   . Cancer Father     prostate  . Hyperlipidemia Father   . Hypertension Father   . Varicose Veins Father   . Heart attack Father   . Peripheral vascular disease Father   .  Heart disease Sister   . Arthritis Sister   . Hyperlipidemia Sister   . Obesity Sister   . Macular degeneration Sister   . Heart disease Brother     5 stents  . Hyperlipidemia Brother   . Macular degeneration Maternal Grandfather   . Cirrhosis Sister   . Obesity Sister   . Arthritis Sister   . Heart disease Sister   . Obesity Sister    History  Substance Use Topics  . Smoking status: Former Smoker -- 0.50 packs/day for 50 years    Types: Cigarettes    Quit date: 05/01/2013  . Smokeless tobacco: Former Systems developer     Comment: using e-Cig//ldc  . Alcohol Use: No    Review of Systems + sore throat + hoarse + cough No pleuritic pain No wheezing + nasal congestion + post-nasal drainage No sinus pain/pressure No itchy/red eyes No earache No hemoptysis No SOB + low grade fever, + chills No nausea No vomiting No abdominal pain No diarrhea No urinary symptoms No skin rash + fatigue + myalgias No headache Used OTC meds  without relief  Allergies  Polymyxin b-trimethoprim; Atorvastatin; Avelox; Guaifenesin & derivatives; Mucinex d; Polymyxin b; Pseudoephedrine; Simvastatin; Statins; Vioxx; Ciprofloxacin; and Codeine phosphate  Home Medications   Prior to Admission medications   Medication Sig Start Date End Date Taking? Authorizing Provider  apixaban (ELIQUIS) 5 MG TABS tablet Take 1 tablet (5 mg total) by mouth 2 (two) times daily. 09/07/14   Evans Lance, MD  azithromycin (ZITHROMAX Z-PAK) 250 MG tablet Take 2 tabs today; then begin one tab once daily for 4 more days. 09/08/14   Kandra Nicolas, MD  benzonatate (TESSALON) 200 MG capsule Take 1 capsule (200 mg total) by mouth at bedtime. Take as needed for cough 09/08/14   Kandra Nicolas, MD  clobetasol (TEMOVATE) 0.05 % external solution APPLY TWICE A DAY Patient taking differently: APPLY TOPICALLY TWICE A DAY AS NEEDED FOR DRY SPOTS ON FACE AND SCALP 06/18/14   Ricard Dillon, MD  ezetimibe (ZETIA) 10 MG tablet Take 10 mg  by mouth daily with supper.    Historical Provider, MD  lubiprostone (AMITIZA) 8 MCG capsule Take 1-2 tablets a day    Historical Provider, MD  Multiple Vitamins-Minerals (ICAPS MV) TABS Take 1 tablet by mouth 2 (two) times daily.     Historical Provider, MD  omeprazole (PRILOSEC OTC) 20 MG tablet Take 20 mg by mouth daily.    Historical Provider, MD  Ranibizumab (LUCENTIS IO) Inject into the eye as needed (Macular degeneration).     Historical Provider, MD   BP 131/75 mmHg  Pulse 67  Temp(Src) 98 F (36.7 C) (Oral)  Ht 5\' 5"  (1.651 m)  Wt 162 lb (73.483 kg)  BMI 26.96 kg/m2  SpO2 98% Physical Exam Nursing notes and Vital Signs reviewed. Appearance:  Patient appears healthy, stated age, and in no acute distress Eyes:  Pupils are equal, round, and reactive to light and accomodation.  Extraocular movement is intact.  Conjunctivae are not inflamed  Ears:  Canals normal.  Tympanic membranes normal.  Nose:  Mildly congested turbinates.  No sinus tenderness.  Marland Kitchen  Pharynx:  Normal Neck:  Supple.   Tender enlarged posterior nodes are palpated bilaterally  Lungs:  Clear to auscultation.  Breath sounds are equal.  Heart:  Regular rate and rhythm without murmurs, rubs, or gallops.  Abdomen:  Nontender without masses or hepatosplenomegaly.  Bowel sounds are present.  No CVA or flank tenderness.  Extremities:  No edema.  No calf tenderness Skin:  No rash present.   ED Course  Procedures  none     MDM   1. Viral URI with cough    With a history of COPD, will begin Z-pack.  Prescription written for Benzonatate [facility]) to take at bedtime for night-time cough.  Increase fluid intake, rest. May use Afrin nasal spray (or generic oxymetazoline) twice daily for about 5 days.  Also recommend using saline nasal spray several times daily and saline nasal irrigation (AYR is a common brand) Try warm salt water gargles for sore throat.  Stop all antihistamines for now, and other non-prescription  cough/cold preparations.  Follow-up with family doctor if not improving 7 to 10 days.     Kandra Nicolas, MD 09/11/14 0830

## 2014-09-08 NOTE — ED Notes (Signed)
Pt complains of cough and nasal congestion for 3 days,  Mucus is clear and cloudy.  Denies ear pain sinus pressure.

## 2014-09-10 ENCOUNTER — Other Ambulatory Visit: Payer: Self-pay | Admitting: *Deleted

## 2014-09-10 MED ORDER — APIXABAN 5 MG PO TABS: 5.0000 mg | ORAL_TABLET | Freq: Two times a day (BID) | ORAL | Status: DC

## 2014-10-31 DIAGNOSIS — Z87891 Personal history of nicotine dependence: Secondary | ICD-10-CM | POA: Diagnosis not present

## 2014-10-31 DIAGNOSIS — H2513 Age-related nuclear cataract, bilateral: Secondary | ICD-10-CM | POA: Diagnosis not present

## 2014-10-31 DIAGNOSIS — H43812 Vitreous degeneration, left eye: Secondary | ICD-10-CM | POA: Diagnosis not present

## 2014-10-31 DIAGNOSIS — H3531 Nonexudative age-related macular degeneration: Secondary | ICD-10-CM | POA: Diagnosis not present

## 2014-10-31 DIAGNOSIS — H3532 Exudative age-related macular degeneration: Secondary | ICD-10-CM | POA: Diagnosis not present

## 2014-11-01 DIAGNOSIS — M5136 Other intervertebral disc degeneration, lumbar region: Secondary | ICD-10-CM | POA: Diagnosis not present

## 2014-11-01 DIAGNOSIS — Z6828 Body mass index (BMI) 28.0-28.9, adult: Secondary | ICD-10-CM | POA: Diagnosis not present

## 2014-11-01 DIAGNOSIS — R030 Elevated blood-pressure reading, without diagnosis of hypertension: Secondary | ICD-10-CM | POA: Diagnosis not present

## 2014-11-01 DIAGNOSIS — M4806 Spinal stenosis, lumbar region: Secondary | ICD-10-CM | POA: Diagnosis not present

## 2014-11-01 DIAGNOSIS — M4726 Other spondylosis with radiculopathy, lumbar region: Secondary | ICD-10-CM | POA: Diagnosis not present

## 2014-11-01 DIAGNOSIS — M5416 Radiculopathy, lumbar region: Secondary | ICD-10-CM | POA: Diagnosis not present

## 2014-11-12 ENCOUNTER — Other Ambulatory Visit: Payer: Self-pay | Admitting: Family Medicine

## 2014-11-12 MED ORDER — EZETIMIBE 10 MG PO TABS: 10.0000 mg | ORAL_TABLET | Freq: Every day | ORAL | Status: DC

## 2015-01-04 ENCOUNTER — Telehealth: Payer: Self-pay | Admitting: Family Medicine

## 2015-01-04 NOTE — Telephone Encounter (Signed)
Pre visit letter sent  °

## 2015-01-14 ENCOUNTER — Ambulatory Visit (INDEPENDENT_AMBULATORY_CARE_PROVIDER_SITE_OTHER): Payer: Medicare Other | Admitting: Family Medicine

## 2015-01-14 ENCOUNTER — Encounter: Payer: Self-pay | Admitting: Family Medicine

## 2015-01-14 VITALS — BP 130/80 | HR 64 | Temp 97.8°F | Resp 16 | Ht 65.0 in | Wt 165.0 lb

## 2015-01-14 DIAGNOSIS — E785 Hyperlipidemia, unspecified: Secondary | ICD-10-CM | POA: Diagnosis not present

## 2015-01-14 DIAGNOSIS — K625 Hemorrhage of anus and rectum: Secondary | ICD-10-CM | POA: Diagnosis not present

## 2015-01-14 DIAGNOSIS — Z0000 Encounter for general adult medical examination without abnormal findings: Secondary | ICD-10-CM

## 2015-01-14 DIAGNOSIS — I4892 Unspecified atrial flutter: Secondary | ICD-10-CM

## 2015-01-14 DIAGNOSIS — N529 Male erectile dysfunction, unspecified: Secondary | ICD-10-CM | POA: Diagnosis not present

## 2015-01-14 DIAGNOSIS — I10 Essential (primary) hypertension: Secondary | ICD-10-CM

## 2015-01-14 DIAGNOSIS — K219 Gastro-esophageal reflux disease without esophagitis: Secondary | ICD-10-CM

## 2015-01-14 DIAGNOSIS — K5909 Other constipation: Secondary | ICD-10-CM

## 2015-01-14 DIAGNOSIS — I4891 Unspecified atrial fibrillation: Secondary | ICD-10-CM

## 2015-01-14 DIAGNOSIS — N401 Enlarged prostate with lower urinary tract symptoms: Secondary | ICD-10-CM

## 2015-01-14 DIAGNOSIS — N400 Enlarged prostate without lower urinary tract symptoms: Secondary | ICD-10-CM | POA: Diagnosis not present

## 2015-01-14 DIAGNOSIS — L309 Dermatitis, unspecified: Secondary | ICD-10-CM

## 2015-01-14 LAB — CBC
HCT: 38.8 % — ABNORMAL LOW (ref 39.0–52.0)
Hemoglobin: 13.6 g/dL (ref 13.0–17.0)
MCHC: 35.1 g/dL (ref 30.0–36.0)
MCV: 88.2 fl (ref 78.0–100.0)
Platelets: 200 10*3/uL (ref 150.0–400.0)
RBC: 4.41 Mil/uL (ref 4.22–5.81)
RDW: 12.7 % (ref 11.5–15.5)
WBC: 8.4 10*3/uL (ref 4.0–10.5)

## 2015-01-14 LAB — COMPREHENSIVE METABOLIC PANEL
ALT: 13 U/L (ref 0–53)
AST: 17 U/L (ref 0–37)
Albumin: 4 g/dL (ref 3.5–5.2)
Alkaline Phosphatase: 58 U/L (ref 39–117)
BUN: 15 mg/dL (ref 6–23)
CO2: 28 mEq/L (ref 19–32)
Calcium: 9.9 mg/dL (ref 8.4–10.5)
Chloride: 102 mEq/L (ref 96–112)
Creatinine, Ser: 0.86 mg/dL (ref 0.40–1.50)
GFR: 93.99 mL/min (ref >60.00)
Glucose, Bld: 86 mg/dL (ref 70–99)
Potassium: 5 mEq/L (ref 3.5–5.1)
Sodium: 135 mEq/L (ref 135–145)
Total Bilirubin: 0.5 mg/dL (ref 0.2–1.2)
Total Protein: 7.2 g/dL (ref 6.0–8.3)

## 2015-01-14 LAB — LIPID PANEL
Cholesterol: 182 mg/dL (ref 0–200)
HDL: 48.1 mg/dL (ref >39.00)
LDL Cholesterol: 120 mg/dL — ABNORMAL HIGH (ref 0–99)
NonHDL: 133.9
Total CHOL/HDL Ratio: 4
Triglycerides: 69 mg/dL (ref 0.0–149.0)
VLDL: 13.8 mg/dL (ref 0.0–40.0)

## 2015-01-14 LAB — PSA, MEDICARE: PSA: 1.64 ng/ml (ref 0.10–4.00)

## 2015-01-14 LAB — TSH: TSH: 1.47 u[IU]/mL (ref 0.35–4.50)

## 2015-01-14 MED ORDER — CLOBETASOL PROPIONATE 0.05 % EX SOLN: 1.0000 "application " | Freq: Two times a day (BID) | CUTANEOUS | Status: DC

## 2015-01-14 NOTE — Progress Notes (Signed)
Ryan [name]    TO [place] FINISHED [id number] 09-Aug-1946 01/14/2015      Progress Note-Follow Up  Subjective  Chief Complaint  Chief Complaint  Patient presents with  . Medicare Wellness   Here for Medicare AWV:  1. Risk factors based on Past M, S, F history: reviewed 2. Physical Activities: patient remains active 3. Depression/mood: neg screening  4. Hearing:  No problems noted or reported  5. ADL's: independent, drives  6. Fall Risk: no recent falls, prevention discussed  7. home Safety: does feel safe at home  8. Height, weight, & visual acuity: see VS, sees eye doctor regulalrly 9. Counseling: provided 10. Labs ordered based on risk factors: CBC, FLP, PSA, CMP, TSH 11. Referral Coordination: not necessary today 12. Care Plan, see assessment and plan , written personalized plan provided  13. Cognitive Assessment: motor skills and cognition appropriate for age 38. Care team updated 15. End-of-life care: Plan provided by patient and discussed with patient.  In addition, today we discussed the following:  Patient is a 69 year old male in today for a complete Medicare physical per above. No major concerns today, he is in good spirits with a history of atrial flutter with rapid ventricular response that resulted in a syncopal episode in May 2015 but none since. He has tolerated his Eliquis well, he notes internal hemorrhoids and occasional hematochezia as a result. He is following with Dr. Lovena Le at cardiology for care plan. He also has a history of GERD, eczema, macular degeneration, and lumbar radiculopathy.   GERD is well controlled on daily Prilosec with no complaints of nausea, vomiting, dysphagia, odynophagia, or abdominal pain. Other conditions are followed by their respective specialties.  Ryan [name] is up to date on all maintenance screening and preventative vaccinations.    Past Medical History  Diagnosis Date  . H/O: rheumatic fever   . Kidney stones   . GERD  (gastroesophageal reflux disease)   . Heart murmur   . Anemia   . Hypertension   . Macular degeneration   . Hyperlipidemia   . History of colonoscopy   . Heart murmur   . COPD (chronic obstructive pulmonary disease)   . Atrial flutter 03/31/2012    converted spontaneously to sinus  . High risk medication use     on amiodarone since 03/31/2012  . CAD (coronary artery disease)     reportedly moderate CAD; managed medically  . Eczema 08/03/2014    Past Surgical History  Procedure Laterality Date  . Cervical discectomy      C5,6,7 disc fused  with plate and 5 1" screws  . Cholecystectomy    . Mouth surgery      periodontal surgery, bridges, splint in front,   . Knee cartiledge Right     right knee  . Foot surgery      right calcification removed from top of foot  . Appendectomy    . Cardiac catheterization  04/22/2011    moderate left main and RCA stenosis not significant by FFR and IVUS on medical therapy    Family History  Problem Relation Age of Onset  . Liver disease    . Prostate cancer    . Coronary artery disease    . Heart disease Mother   . Diabetes Mother   . Cirrhosis Mother   . Emphysema Mother     never smoked but 2nd hand through her spouse  . Hypertension Mother   . Macular degeneration Mother   .  Heart disease Father   . Cancer Father     prostate  . Hyperlipidemia Father   . Hypertension Father   . Varicose Veins Father   . Heart attack Father   . Peripheral vascular disease Father   . Heart disease Sister   . Arthritis Sister   . Hyperlipidemia Sister   . Obesity Sister   . Macular degeneration Sister   . Heart disease Brother     5 stents  . Hyperlipidemia Brother   . Macular degeneration Maternal Grandfather   . Cirrhosis Sister   . Obesity Sister   . Arthritis Sister   . Heart disease Sister   . Obesity Sister     History   Social History  . Marital Status: Married    Spouse Name: N/A  . Number of Children: 0  . Years of  Education: N/A   Occupational History  . retired    Social History Main Topics  . Smoking status: Former Smoker -- 0.50 packs/day for 50 years    Types: Cigarettes    Quit date: 05/01/2013  . Smokeless tobacco: Former Systems developer     Comment: using e-Cig//ldc  . Alcohol Use: No  . Drug Use: No  . Sexual Activity: Yes     Comment: lives with wife, no dietary restrictions   Other Topics Concern  . Not on file   Social History Narrative    Current Outpatient Prescriptions on File Prior to Visit  Medication Sig Dispense Refill  . apixaban (ELIQUIS) 5 MG TABS tablet Take 1 tablet (5 mg total) by mouth 2 (two) times daily. 180 tablet 3  . azithromycin (ZITHROMAX Z-PAK) 250 MG tablet Take 2 tabs today; then begin one tab once daily for 4 more days. 6 tablet 0  . benzonatate (TESSALON) 200 MG capsule Take 1 capsule (200 mg total) by mouth at bedtime. Take as needed for cough 12 capsule 0  . ezetimibe (ZETIA) 10 MG tablet Take 1 tablet (10 mg total) by mouth daily with supper. 90 tablet 1  . lubiprostone (AMITIZA) 8 MCG capsule Take 1-2 tablets a day    . Multiple Vitamins-Minerals (ICAPS MV) TABS Take 1 tablet by mouth 2 (two) times daily.     Marland Kitchen omeprazole (PRILOSEC OTC) 20 MG tablet Take 20 mg by mouth daily.    . Ranibizumab (LUCENTIS IO) Inject into the eye as needed (Macular degeneration).      No current facility-administered medications on file prior to visit.    Allergies  Allergen Reactions  . Polymyxin B-Trimethoprim Swelling    Eye drops made eyes swell  . Atorvastatin Other (See Comments)    Muscle aches and cramps  . Avelox [Moxifloxacin] Other (See Comments)    Stomach cramps  . Guaifenesin & Derivatives   . Mucinex D [Pseudoephedrine-Guaifenesin Er] Nausea And Vomiting    Stomach cramps  . Polymyxin B Other (See Comments)    Swelling  . Pseudoephedrine   . Simvastatin Other (See Comments)    Muscle aches and cramps  . Statins Other (See Comments)    Muscle aches    . Vioxx [Rofecoxib] Other (See Comments)    Stomach cramping  . Ciprofloxacin Itching and Rash  . Codeine Phosphate Itching and Rash    Review of Systems  Review of Systems  Respiratory: Negative for shortness of breath.   Cardiovascular: Negative for chest pain, palpitations, orthopnea and leg swelling.  Gastrointestinal: Positive for heartburn and blood in stool.  Genitourinary: Negative for  dysuria, urgency, frequency, hematuria and flank pain.  Musculoskeletal: Negative for falls.  Psychiatric/Behavioral: Negative for depression, suicidal ideas and substance abuse.  All other systems reviewed and are negative.   Objective  BP 130/80 mmHg  Pulse 64  Temp(Src) 97.8 F (36.6 C) (Oral)  Resp 16  Ht 5\' 5"  (1.651 m)  Wt 165 lb (74.844 kg)  BMI 27.46 kg/m2  SpO2 98%  Physical Exam  Physical Exam  Constitutional: He is oriented to person, place, and time and well-developed, well-nourished, and in no distress.  HENT:  Head: Normocephalic and atraumatic.  Mouth/Throat: Oropharynx is clear and moist.  Eyes: No scleral icterus.  Neck: Neck supple. No thyromegaly present.  Cardiovascular: Normal rate, regular rhythm, normal heart sounds and intact distal pulses.  Exam reveals no friction rub.   No murmur heard. Pulmonary/Chest: Effort normal and breath sounds normal.  Abdominal: Soft. Bowel sounds are normal. He exhibits no distension. There is no tenderness.  Lymphadenopathy:    He has no cervical adenopathy.  Neurological: He is alert and oriented to person, place, and time. Gait normal.  Skin: Skin is warm. Rash noted.  Rosacea on face  Psychiatric: Mood and affect normal.  Vitals reviewed.   Lab Results  Component Value Date   TSH 1.904 08/03/2014   Lab Results  Component Value Date   WBC 8.5 08/03/2014   HGB 13.7 08/03/2014   HCT 39.1 08/03/2014   MCV 86.3 08/03/2014   PLT 210 08/03/2014   Lab Results  Component Value Date   CREATININE 0.75 08/03/2014    BUN 13 08/03/2014   NA 140 08/03/2014   K 4.6 08/03/2014   CL 103 08/03/2014   CO2 26 08/03/2014   Lab Results  Component Value Date   ALT 13 08/03/2014   AST 18 08/03/2014   ALKPHOS 53 08/03/2014   BILITOT 0.5 08/03/2014   Lab Results  Component Value Date   CHOL 188 08/03/2014   Lab Results  Component Value Date   HDL 44 08/03/2014   Lab Results  Component Value Date   LDLCALC 126* 08/03/2014   Lab Results  Component Value Date   TRIG 90 08/03/2014   Lab Results  Component Value Date   CHOLHDL 4.3 08/03/2014     Assessment & Plan  No problem-specific assessment & plan notes found for this encounter.  Atrial flutter Had a syncopal episode in May 2015 but nothing since then. He is tolerating Eliquis well with internal hemorrhoids diagnosed on colonoscopy and endoscopy. Following with Dr. Lovena Le in cardiology. Plan: Check labs today: CBC, FLP, CMP, TSH Follow up in 6 months.  GERD Currently well controlled on 20mg  Prilosec OTC daily. No complaints of nausea/vomiting/dysphagi/odynophagia. Counseled on avoiding trigger foods. Plan: Continue current regimen of daily Prilosec OTC.   Eczema: Uses clobetasol cream and follows with Dr. Allyson Sabal.  Lumbar Radiculopathy Receiving cortisone injections every 6 months from Dr. Sherwood Gambler in neurology.  Macular Degeneration Left eye wet macular degeneration, right eye dry macular degeneration. Currently monitored by Dr.Greven in ophthalmology.  BPH: Well controlled, will recheck PSA today.  This note was scribed by medical student Aletta Edouard

## 2015-01-14 NOTE — Patient Instructions (Signed)
Preventive Care for Adults A healthy lifestyle and preventive care can promote health and wellness. Preventive health guidelines for men include the following key practices:  A routine yearly physical is a good way to check with your health care provider about your health and preventative screening. It is a chance to share any concerns and updates on your health and to receive a thorough exam.  Visit your dentist for a routine exam and preventative care every 6 months. Brush your teeth twice a day and floss once a day. Good oral hygiene prevents tooth decay and gum disease.  The frequency of eye exams is based on your age, health, family medical history, use of contact lenses, and other factors. Follow your health care provider's recommendations for frequency of eye exams.  Eat a healthy diet. Foods such as vegetables, fruits, whole grains, low-fat dairy products, and lean protein foods contain the nutrients you need without too many calories. Decrease your intake of foods high in solid fats, added sugars, and salt. Eat the right amount of calories for you.Get information about a proper diet from your health care provider, if necessary.  Regular physical exercise is one of the most important things you can do for your health. Most adults should get at least 150 minutes of moderate-intensity exercise (any activity that increases your heart rate and causes you to sweat) each week. In addition, most adults need muscle-strengthening exercises on 2 or more days a week.  Maintain a healthy weight. The body mass index (BMI) is a screening tool to identify possible weight problems. It provides an estimate of body fat based on height and weight. Your health care provider can find your BMI and can help you achieve or maintain a healthy weight.For adults 20 years and older:  A BMI below 18.5 is considered underweight.  A BMI of 18.5 to 24.9 is normal.  A BMI of 25 to 29.9 is considered overweight.  A BMI  of 30 and above is considered obese.  Maintain normal blood lipids and cholesterol levels by exercising and minimizing your intake of saturated fat. Eat a balanced diet with plenty of fruit and vegetables. Blood tests for lipids and cholesterol should begin at age 50 and be repeated every 5 years. If your lipid or cholesterol levels are high, you are over 50, or you are at high risk for heart disease, you may need your cholesterol levels checked more frequently.Ongoing high lipid and cholesterol levels should be treated with medicines if diet and exercise are not working.  If you smoke, find out from your health care provider how to quit. If you do not use tobacco, do not start.  Lung cancer screening is recommended for adults aged 73-80 years who are at high risk for developing lung cancer because of a history of smoking. A yearly low-dose CT scan of the lungs is recommended for people who have at least a 30-pack-year history of smoking and are a current smoker or have quit within the past 15 years. A pack year of smoking is smoking an average of 1 pack of cigarettes a day for 1 year (for example: 1 pack a day for 30 years or 2 packs a day for 15 years). Yearly screening should continue until the smoker has stopped smoking for at least 15 years. Yearly screening should be stopped for people who develop a health problem that would prevent them from having lung cancer treatment.  If you choose to drink alcohol, do not have more than  2 drinks per day. One drink is considered to be 12 ounces (355 mL) of beer, 5 ounces (148 mL) of wine, or 1.5 ounces (44 mL) of liquor.  Avoid use of street drugs. Do not share needles with anyone. Ask for help if you need support or instructions about stopping the use of drugs.  High blood pressure causes heart disease and increases the risk of stroke. Your blood pressure should be checked at least every 1-2 years. Ongoing high blood pressure should be treated with  medicines, if weight loss and exercise are not effective.  If you are 45-79 years old, ask your health care provider if you should take aspirin to prevent heart disease.  Diabetes screening involves taking a blood sample to check your fasting blood sugar level. This should be done once every 3 years, after age 45, if you are within normal weight and without risk factors for diabetes. Testing should be considered at a younger age or be carried out more frequently if you are overweight and have at least 1 risk factor for diabetes.  Colorectal cancer can be detected and often prevented. Most routine colorectal cancer screening begins at the age of 50 and continues through age 75. However, your health care provider may recommend screening at an earlier age if you have risk factors for colon cancer. On a yearly basis, your health care provider may provide home test kits to check for hidden blood in the stool. Use of a small camera at the end of a tube to directly examine the colon (sigmoidoscopy or colonoscopy) can detect the earliest forms of colorectal cancer. Talk to your health care provider about this at age 50, when routine screening begins. Direct exam of the colon should be repeated every 5-10 years through age 75, unless early forms of precancerous polyps or small growths are found.  People who are at an increased risk for hepatitis B should be screened for this virus. You are considered at high risk for hepatitis B if:  You were born in a country where hepatitis B occurs often. Talk with your health care provider about which countries are considered high risk.  Your parents were born in a high-risk country and you have not received a shot to protect against hepatitis B (hepatitis B vaccine).  You have HIV or AIDS.  You use needles to inject street drugs.  You live with, or have sex with, someone who has hepatitis B.  You are a man who has sex with other men (MSM).  You get hemodialysis  treatment.  You take certain medicines for conditions such as cancer, organ transplantation, and autoimmune conditions.  Hepatitis C blood testing is recommended for all people born from 1945 through 1965 and any individual with known risks for hepatitis C.  Practice safe sex. Use condoms and avoid high-risk sexual practices to reduce the spread of sexually transmitted infections (STIs). STIs include gonorrhea, chlamydia, syphilis, trichomonas, herpes, HPV, and human immunodeficiency virus (HIV). Herpes, HIV, and HPV are viral illnesses that have no cure. They can result in disability, cancer, and death.  If you are at risk of being infected with HIV, it is recommended that you take a prescription medicine daily to prevent HIV infection. This is called preexposure prophylaxis (PrEP). You are considered at risk if:  You are a man who has sex with other men (MSM) and have other risk factors.  You are a heterosexual man, are sexually active, and are at increased risk for HIV infection.    You take drugs by injection.  You are sexually active with a partner who has HIV.  Talk with your health care provider about whether you are at high risk of being infected with HIV. If you choose to begin PrEP, you should first be tested for HIV. You should then be tested every 3 months for as long as you are taking PrEP.  A one-time screening for abdominal aortic aneurysm (AAA) and surgical repair of large AAAs by ultrasound are recommended for men ages 32 to 67 years who are current or former smokers.  Healthy men should no longer receive prostate-specific antigen (PSA) blood tests as part of routine cancer screening. Talk with your health care provider about prostate cancer screening.  Testicular cancer screening is not recommended for adult males who have no symptoms. Screening includes self-exam, a health care provider exam, and other screening tests. Consult with your health care provider about any symptoms  you have or any concerns you have about testicular cancer.  Use sunscreen. Apply sunscreen liberally and repeatedly throughout the day. You should seek shade when your shadow is shorter than you. Protect yourself by wearing long sleeves, pants, a wide-brimmed hat, and sunglasses year round, whenever you are outdoors.  Once a month, do a whole-body skin exam, using a mirror to look at the skin on your back. Tell your health care provider about new moles, moles that have irregular borders, moles that are larger than a pencil eraser, or moles that have changed in shape or color.  Stay current with required vaccines (immunizations).  Influenza vaccine. All adults should be immunized every year.  Tetanus, diphtheria, and acellular pertussis (Td, Tdap) vaccine. An adult who has not previously received Tdap or who does not know his vaccine status should receive 1 dose of Tdap. This initial dose should be followed by tetanus and diphtheria toxoids (Td) booster doses every 10 years. Adults with an unknown or incomplete history of completing a 3-dose immunization series with Td-containing vaccines should begin or complete a primary immunization series including a Tdap dose. Adults should receive a Td booster every 10 years.  Varicella vaccine. An adult without evidence of immunity to varicella should receive 2 doses or a second dose if he has previously received 1 dose.  Human papillomavirus (HPV) vaccine. Males aged 68-21 years who have not received the vaccine previously should receive the 3-dose series. Males aged 22-26 years may be immunized. Immunization is recommended through the age of 6 years for any male who has sex with males and did not get any or all doses earlier. Immunization is recommended for any person with an immunocompromised condition through the age of 49 years if he did not get any or all doses earlier. During the 3-dose series, the second dose should be obtained 4-8 weeks after the first  dose. The third dose should be obtained 24 weeks after the first dose and 16 weeks after the second dose.  Zoster vaccine. One dose is recommended for adults aged 50 years or older unless certain conditions are present.  Measles, mumps, and rubella (MMR) vaccine. Adults born before [birth year] generally are considered immune to measles and mumps. Adults born in [birth year] or later should have 1 or more doses of MMR vaccine unless there is a contraindication to the vaccine or there is laboratory evidence of immunity to each of the three diseases. A routine second dose of MMR vaccine should be obtained at least 28 days after the first dose for students attending postsecondary  schools, health care workers, or international travelers. People who received inactivated measles vaccine or an unknown type of measles vaccine during 1963-1967 should receive 2 doses of MMR vaccine. People who received inactivated mumps vaccine or an unknown type of mumps vaccine before 1979 and are at high risk for mumps infection should consider immunization with 2 doses of MMR vaccine. Unvaccinated health care workers born before 1957 who lack laboratory evidence of measles, mumps, or rubella immunity or laboratory confirmation of disease should consider measles and mumps immunization with 2 doses of MMR vaccine or rubella immunization with 1 dose of MMR vaccine.  Pneumococcal 13-valent conjugate (PCV13) vaccine. When indicated, a person who is uncertain of his immunization history and has no record of immunization should receive the PCV13 vaccine. An adult aged 19 years or older who has certain medical conditions and has not been previously immunized should receive 1 dose of PCV13 vaccine. This PCV13 should be followed with a dose of pneumococcal polysaccharide (PPSV23) vaccine. The PPSV23 vaccine dose should be obtained at least 8 weeks after the dose of PCV13 vaccine. An adult aged 19 years or older who has certain medical conditions and  previously received 1 or more doses of PPSV23 vaccine should receive 1 dose of PCV13. The PCV13 vaccine dose should be obtained 1 or more years after the last PPSV23 vaccine dose.  Pneumococcal polysaccharide (PPSV23) vaccine. When PCV13 is also indicated, PCV13 should be obtained first. All adults aged 65 years and older should be immunized. An adult younger than age 65 years who has certain medical conditions should be immunized. Any person who resides in a nursing home or long-term care facility should be immunized. An adult smoker should be immunized. People with an immunocompromised condition and certain other conditions should receive both PCV13 and PPSV23 vaccines. People with human immunodeficiency virus (HIV) infection should be immunized as soon as possible after diagnosis. Immunization during chemotherapy or radiation therapy should be avoided. Routine use of PPSV23 vaccine is not recommended for American Indians, [place] Natives, or people younger than 65 years unless there are medical conditions that require PPSV23 vaccine. When indicated, people who have unknown immunization and have no record of immunization should receive PPSV23 vaccine. One-time revaccination 5 years after the first dose of PPSV23 is recommended for people aged 19-64 years who have chronic kidney failure, nephrotic syndrome, asplenia, or immunocompromised conditions. People who received 1-2 doses of PPSV23 before age 65 years should receive another dose of PPSV23 vaccine at age 65 years or later if at least 5 years have passed since the previous dose. Doses of PPSV23 are not needed for people immunized with PPSV23 at or after age 65 years.  Meningococcal vaccine. Adults with asplenia or persistent complement component deficiencies should receive 2 doses of quadrivalent meningococcal conjugate (MenACWY-D) vaccine. The doses should be obtained at least 2 months apart. Microbiologists working with certain meningococcal bacteria,  military recruits, people at risk during an outbreak, and people who travel to or live in countries with a high rate of meningitis should be immunized. A first-year college student up through age 21 years who is living in a residence hall should receive a dose if he did not receive a dose on or after his 16th birthday. Adults who have certain high-risk conditions should receive one or more doses of vaccine.  Hepatitis A vaccine. Adults who wish to be protected from this disease, have certain high-risk conditions, work with hepatitis A-infected animals, work in hepatitis A research labs, or   travel to or work in countries with a high rate of hepatitis A should be immunized. Adults who were previously unvaccinated and who anticipate close contact with an international adoptee during the first 60 days after arrival in the [place] from a country with a high rate of hepatitis A should be immunized.  Hepatitis B vaccine. Adults should be immunized if they wish to be protected from this disease, have certain high-risk conditions, may be exposed to blood or other infectious body fluids, are household contacts or sex partners of hepatitis B positive people, are clients or workers in certain care facilities, or travel to or work in countries with a high rate of hepatitis B.  Haemophilus influenzae type b (Hib) vaccine. A previously unvaccinated person with asplenia or sickle cell disease or having a scheduled splenectomy should receive 1 dose of Hib vaccine. Regardless of previous immunization, a recipient of a hematopoietic stem cell transplant should receive a 3-dose series 6-12 months after his successful transplant. Hib vaccine is not recommended for adults with HIV infection. Preventive Service / Frequency Ages 52 to 17  Blood pressure check.** / Every 1 to 2 years.  Lipid and cholesterol check.** / Every 5 years beginning at age 69.  Hepatitis C blood test.** / For any individual with known risks for  hepatitis C.  Skin self-exam. / Monthly.  Influenza vaccine. / Every year.  Tetanus, diphtheria, and acellular pertussis (Tdap, Td) vaccine.** / Consult your health care provider. 1 dose of Td every 10 years.  Varicella vaccine.** / Consult your health care provider.  HPV vaccine. / 3 doses over 6 months, if 72 or younger.  Measles, mumps, rubella (MMR) vaccine.** / You need at least 1 dose of MMR if you were born in 1957 or later. You may also need a second dose.  Pneumococcal 13-valent conjugate (PCV13) vaccine.** / Consult your health care provider.  Pneumococcal polysaccharide (PPSV23) vaccine.** / 1 to 2 doses if you smoke cigarettes or if you have certain conditions.  Meningococcal vaccine.** / 1 dose if you are age 35 to 60 years and a Market researcher living in a residence hall, or have one of several medical conditions. You may also need additional booster doses.  Hepatitis A vaccine.** / Consult your health care provider.  Hepatitis B vaccine.** / Consult your health care provider.  Haemophilus influenzae type b (Hib) vaccine.** / Consult your health care provider. Ages 35 to 8  Blood pressure check.** / Every 1 to 2 years.  Lipid and cholesterol check.** / Every 5 years beginning at age 57.  Lung cancer screening. / Every year if you are aged 44-80 years and have a 30-pack-year history of smoking and currently smoke or have quit within the past 15 years. Yearly screening is stopped once you have quit smoking for at least 15 years or develop a health problem that would prevent you from having lung cancer treatment.  Fecal occult blood test (FOBT) of stool. / Every year beginning at age 55 and continuing until age 73. You may not have to do this test if you get a colonoscopy every 10 years.  Flexible sigmoidoscopy** or colonoscopy.** / Every 5 years for a flexible sigmoidoscopy or every 10 years for a colonoscopy beginning at age 28 and continuing until age  1.  Hepatitis C blood test.** / For all people born from [birth year] through 1965 and any individual with known risks for hepatitis C.  Skin self-exam. / Monthly.  Influenza vaccine. / Every  year.  Tetanus, diphtheria, and acellular pertussis (Tdap/Td) vaccine.** / Consult your health care provider. 1 dose of Td every 10 years.  Varicella vaccine.** / Consult your health care provider.  Zoster vaccine.** / 1 dose for adults aged 53 years or older.  Measles, mumps, rubella (MMR) vaccine.** / You need at least 1 dose of MMR if you were born in 1957 or later. You may also need a second dose.  Pneumococcal 13-valent conjugate (PCV13) vaccine.** / Consult your health care provider.  Pneumococcal polysaccharide (PPSV23) vaccine.** / 1 to 2 doses if you smoke cigarettes or if you have certain conditions.  Meningococcal vaccine.** / Consult your health care provider.  Hepatitis A vaccine.** / Consult your health care provider.  Hepatitis B vaccine.** / Consult your health care provider.  Haemophilus influenzae type b (Hib) vaccine.** / Consult your health care provider. Ages 77 and over  Blood pressure check.** / Every 1 to 2 years.  Lipid and cholesterol check.**/ Every 5 years beginning at age 85.  Lung cancer screening. / Every year if you are aged 55-80 years and have a 30-pack-year history of smoking and currently smoke or have quit within the past 15 years. Yearly screening is stopped once you have quit smoking for at least 15 years or develop a health problem that would prevent you from having lung cancer treatment.  Fecal occult blood test (FOBT) of stool. / Every year beginning at age 33 and continuing until age 11. You may not have to do this test if you get a colonoscopy every 10 years.  Flexible sigmoidoscopy** or colonoscopy.** / Every 5 years for a flexible sigmoidoscopy or every 10 years for a colonoscopy beginning at age 28 and continuing until age 73.  Hepatitis C blood  test.** / For all people born from [birth year] through 1965 and any individual with known risks for hepatitis C.  Abdominal aortic aneurysm (AAA) screening.** / A one-time screening for ages 50 to 27 years who are current or former smokers.  Skin self-exam. / Monthly.  Influenza vaccine. / Every year.  Tetanus, diphtheria, and acellular pertussis (Tdap/Td) vaccine.** / 1 dose of Td every 10 years.  Varicella vaccine.** / Consult your health care provider.  Zoster vaccine.** / 1 dose for adults aged 34 years or older.  Pneumococcal 13-valent conjugate (PCV13) vaccine.** / Consult your health care provider.  Pneumococcal polysaccharide (PPSV23) vaccine.** / 1 dose for all adults aged 63 years and older.  Meningococcal vaccine.** / Consult your health care provider.  Hepatitis A vaccine.** / Consult your health care provider.  Hepatitis B vaccine.** / Consult your health care provider.  Haemophilus influenzae type b (Hib) vaccine.** / Consult your health care provider. **Family history and personal history of risk and conditions may change your health care provider's recommendations. Document Released: 11/17/2001 Document Revised: 09/26/2013 Document Reviewed: 02/16/2011 [facility] Patient Information 2015 [place], [place]. This information is not intended to replace advice given to you by your health care provider. Make sure you discuss any questions you have with your health care provider.

## 2015-01-20 DIAGNOSIS — N400 Enlarged prostate without lower urinary tract symptoms: Secondary | ICD-10-CM | POA: Insufficient documentation

## 2015-01-20 DIAGNOSIS — L309 Dermatitis, unspecified: Secondary | ICD-10-CM | POA: Insufficient documentation

## 2015-01-20 DIAGNOSIS — Z0000 Encounter for general adult medical examination without abnormal findings: Secondary | ICD-10-CM | POA: Insufficient documentation

## 2015-01-20 DIAGNOSIS — N529 Male erectile dysfunction, unspecified: Secondary | ICD-10-CM | POA: Insufficient documentation

## 2015-01-20 DIAGNOSIS — K625 Hemorrhage of anus and rectum: Secondary | ICD-10-CM | POA: Insufficient documentation

## 2015-01-20 NOTE — Assessment & Plan Note (Signed)
Colonoscopy in 2015, infrequent blood, small amount, enocuraged to manage constipaiton and consider anusol suppositories prn, report if worsens

## 2015-01-20 NOTE — Assessment & Plan Note (Signed)
Recent flare, given steroid cream to use prn

## 2015-01-20 NOTE — Assessment & Plan Note (Signed)
Well controlled, no changes to meds. Encouraged heart healthy diet such as the DASH diet and exercise as tolerated.  °

## 2015-01-20 NOTE — Assessment & Plan Note (Signed)
Encouraged increased hydration and fiber in diet. Daily probiotics. If bowels not moving can use MOM 2 tbls po in 4 oz of warm prune juice by mouth every 2-3 days. If no results then repeat in 4 hours with  Dulcolax suppository pr, may repeat again in 4 more hours as needed. Seek care if symptoms worsen. Consider daily Miralax and/or Dulcolax if symptoms persist.  

## 2015-01-20 NOTE — Assessment & Plan Note (Signed)
PSA normal, minimal symptoms, no changes

## 2015-01-20 NOTE — Progress Notes (Signed)
Ryan [name]  [id number] Jul 14, 1946 01/20/2015      Progress Note-Follow Up  Subjective  Chief Complaint  Chief Complaint  Patient presents with  . Medicare Wellness    HPI  Patient is a 70 y.o. male in today for routine medical care. Patient is in today for routine annual exam and feeling well. No recent illness. Medications appear stable. No hospitalizations or acute concerns. Denies any abdominal pain, bloody or tarry stool with blood thinner use. Denies CP/palp/SOB/HA/congestion/fevers/GI or GU c/o. Taking meds as prescribed Past Medical History  Diagnosis Date  . H/O: rheumatic fever   . Kidney stones   . GERD (gastroesophageal reflux disease)   . Heart murmur   . Anemia   . Hypertension   . Macular degeneration   . Hyperlipidemia   . History of colonoscopy   . Heart murmur   . COPD (chronic obstructive pulmonary disease)   . Atrial flutter 03/31/2012    converted spontaneously to sinus  . High risk medication use     on amiodarone since 03/31/2012  . CAD (coronary artery disease)     reportedly moderate CAD; managed medically  . Eczema 08/03/2014    Past Surgical History  Procedure Laterality Date  . Cervical discectomy      C5,6,7 disc fused  with plate and 5 1" screws  . Cholecystectomy    . Mouth surgery      periodontal surgery, bridges, splint in front,   . Knee cartiledge Right     right knee  . Foot surgery      right calcification removed from top of foot  . Appendectomy    . Cardiac catheterization  04/22/2011    moderate left main and RCA stenosis not significant by FFR and IVUS on medical therapy    Family History  Problem Relation Age of Onset  . Liver disease    . Prostate cancer    . Coronary artery disease    . Heart disease Mother   . Diabetes Mother   . Cirrhosis Mother   . Emphysema Mother     never smoked but 2nd hand through her spouse  . Hypertension Mother   . Macular degeneration Mother   . Heart disease Father   .  Cancer Father     prostate  . Hyperlipidemia Father   . Hypertension Father   . Varicose Veins Father   . Heart attack Father   . Peripheral vascular disease Father   . Heart disease Sister   . Arthritis Sister   . Hyperlipidemia Sister   . Obesity Sister   . Macular degeneration Sister   . Heart disease Brother     5 stents  . Hyperlipidemia Brother   . Macular degeneration Maternal Grandfather   . Cirrhosis Sister   . Obesity Sister   . Arthritis Sister   . Heart disease Sister   . Obesity Sister     History   Social History  . Marital Status: Married    Spouse Name: N/A  . Number of Children: 0  . Years of Education: N/A   Occupational History  . retired    Social History Main Topics  . Smoking status: Former Smoker -- 0.50 packs/day for 50 years    Types: Cigarettes    Quit date: 05/01/2013  . Smokeless tobacco: Former Systems developer     Comment: using e-Cig//ldc  . Alcohol Use: No  . Drug Use: No  . Sexual Activity: Yes  Comment: lives with wife, no dietary restrictions   Other Topics Concern  . Not on file   Social History Narrative    Current Outpatient Prescriptions on File Prior to Visit  Medication Sig Dispense Refill  . apixaban (ELIQUIS) 5 MG TABS tablet Take 1 tablet (5 mg total) by mouth 2 (two) times daily. 180 tablet 3  . benzonatate (TESSALON) 200 MG capsule Take 1 capsule (200 mg total) by mouth at bedtime. Take as needed for cough 12 capsule 0  . ezetimibe (ZETIA) 10 MG tablet Take 1 tablet (10 mg total) by mouth daily with supper. 90 tablet 1  . lubiprostone (AMITIZA) 8 MCG capsule Take 1-2 tablets a day    . Multiple Vitamins-Minerals (ICAPS MV) TABS Take 1 tablet by mouth 2 (two) times daily.     Marland Kitchen omeprazole (PRILOSEC OTC) 20 MG tablet Take 20 mg by mouth daily.    . Ranibizumab (LUCENTIS IO) Inject into the eye as needed (Macular degeneration).      No current facility-administered medications on file prior to visit.    Allergies    Allergen Reactions  . Polymyxin B-Trimethoprim Swelling    Eye drops made eyes swell  . Atorvastatin Other (See Comments)    Muscle aches and cramps  . Avelox [Moxifloxacin] Other (See Comments)    Stomach cramps  . Guaifenesin & Derivatives   . Mucinex D [Pseudoephedrine-Guaifenesin Er] Nausea And Vomiting    Stomach cramps  . Polymyxin B Other (See Comments)    Swelling  . Pseudoephedrine   . Simvastatin Other (See Comments)    Muscle aches and cramps  . Statins Other (See Comments)    Muscle aches  . Vioxx [Rofecoxib] Other (See Comments)    Stomach cramping  . Ciprofloxacin Itching and Rash  . Codeine Phosphate Itching and Rash    Review of Systems  Review of Systems  Constitutional: Negative for fever, chills and malaise/fatigue.  HENT: Negative for congestion, hearing loss and nosebleeds.   Eyes: Negative for discharge.  Respiratory: Negative for cough, sputum production, shortness of breath and wheezing.   Cardiovascular: Negative for chest pain, palpitations and leg swelling.  Gastrointestinal: Negative for heartburn, nausea, vomiting, abdominal pain, diarrhea, constipation and blood in stool.  Genitourinary: Negative for dysuria, urgency, frequency and hematuria.  Musculoskeletal: Negative for myalgias, back pain and falls.  Skin: Negative for rash.  Neurological: Negative for dizziness, tremors, sensory change, focal weakness, loss of consciousness, weakness and headaches.  Endo/Heme/Allergies: Negative for polydipsia. Does not bruise/bleed easily.  Psychiatric/Behavioral: Negative for depression and suicidal ideas. The patient is not nervous/anxious and does not have insomnia.     Objective  BP 130/80 mmHg  Pulse 64  Temp(Src) 97.8 F (36.6 C) (Oral)  Resp 16  Ht 5\' 5"  (1.651 m)  Wt 165 lb (74.844 kg)  BMI 27.46 kg/m2  SpO2 98%  Physical Exam  Physical Exam  Constitutional: He is oriented to person, place, and time and well-developed,  well-nourished, and in no distress. No distress.  HENT:  Head: Normocephalic and atraumatic.  Eyes: Conjunctivae are normal.  Neck: Neck supple. No thyromegaly present.  Cardiovascular: Normal rate and normal heart sounds.   Pulmonary/Chest: Effort normal and breath sounds normal. No respiratory distress.  Abdominal: He exhibits no distension and no mass. There is no tenderness.  Musculoskeletal: He exhibits no edema.  Neurological: He is alert and oriented to person, place, and time.  Skin: Skin is warm.  Psychiatric: Memory, affect and judgment normal.  Lab Results  Component Value Date   TSH 1.47 01/14/2015   Lab Results  Component Value Date   WBC 8.4 01/14/2015   HGB 13.6 01/14/2015   HCT 38.8* 01/14/2015   MCV 88.2 01/14/2015   PLT 200.0 01/14/2015   Lab Results  Component Value Date   CREATININE 0.86 01/14/2015   BUN 15 01/14/2015   NA 135 01/14/2015   K 5.0 01/14/2015   CL 102 01/14/2015   CO2 28 01/14/2015   Lab Results  Component Value Date   ALT 13 01/14/2015   AST 17 01/14/2015   ALKPHOS 58 01/14/2015   BILITOT 0.5 01/14/2015   Lab Results  Component Value Date   CHOL 182 01/14/2015   Lab Results  Component Value Date   HDL 48.10 01/14/2015   Lab Results  Component Value Date   LDLCALC 120* 01/14/2015   Lab Results  Component Value Date   TRIG 69.0 01/14/2015   Lab Results  Component Value Date   CHOLHDL 4 01/14/2015     Assessment & Plan  Essential hypertension Well controlled, no changes to meds. Encouraged heart healthy diet such as the DASH diet and exercise as tolerated.    Chronic constipation Encouraged increased hydration and fiber in diet. Daily probiotics. If bowels not moving can use MOM 2 tbls po in 4 oz of warm prune juice by mouth every 2-3 days. If no results then repeat in 4 hours with  Dulcolax suppository pr, may repeat again in 4 more hours as needed. Seek care if symptoms worsen. Consider daily Miralax and/or  Dulcolax if symptoms persist.    GERD Avoid offending foods, start probiotics. Do not eat large meals in late evening and consider raising head of bed. Continue Omeprazole   Atrial flutter with rapid ventricular response Tolerating Eliquis, rate controlled   Rectal bleeding Colonoscopy in 2015, infrequent blood, small amount, enocuraged to manage constipaiton and consider anusol suppositories prn, report if worsens   Eczema Recent flare, given steroid cream to use prn   BPH (benign prostatic hyperplasia) PSA normal, minimal symptoms, no changes   Medicare annual wellness visit, subsequent Patient denies any difficulties at home. No trouble with ADLs, depression or falls. No recent changes to vision or hearing. Is UTD with immunizations. Is UTD with screening. Discussed Advanced Directives, patient agrees to bring [place] copies of documents if can. Encouraged heart healthy diet, exercise as tolerated and adequate sleep. Colonoscopy July 2015, Dr [facility] Pulmonology Dr Christinia Gully Cardiology, Dr Crissie Sickles, Dr Jenkins [facility], Dr Cordelia Pen Annual labs ordered and reviewed  Check AVS for preventative care recommendations Check problem list for patient risk factors

## 2015-01-20 NOTE — Assessment & Plan Note (Signed)
Patient denies any difficulties at home. No trouble with ADLs, depression or falls. No recent changes to vision or hearing. Is UTD with immunizations. Is UTD with screening. Discussed Advanced Directives, patient agrees to bring [place] copies of documents if can. Encouraged heart healthy diet, exercise as tolerated and adequate sleep. Colonoscopy July 2015, Dr [facility] Pulmonology Dr Christinia Gully [facility], Dr Crissie Sickles, Dr Jenkins [facility], Dr Cordelia Pen Annual labs ordered and reviewed  Check AVS for preventative care recommendations Check problem list for patient risk factors

## 2015-01-20 NOTE — Assessment & Plan Note (Signed)
Tolerating Eliquis, rate controlled

## 2015-01-20 NOTE — Assessment & Plan Note (Signed)
Avoid offending foods, start probiotics. Do not eat large meals in late evening and consider raising head of bed. Continue Omeprazole

## 2015-01-25 ENCOUNTER — Encounter: Payer: Medicare Other | Admitting: Family Medicine

## 2015-02-06 DIAGNOSIS — H43812 Vitreous degeneration, left eye: Secondary | ICD-10-CM | POA: Diagnosis not present

## 2015-02-06 DIAGNOSIS — H2513 Age-related nuclear cataract, bilateral: Secondary | ICD-10-CM | POA: Diagnosis not present

## 2015-02-06 DIAGNOSIS — H3531 Nonexudative age-related macular degeneration: Secondary | ICD-10-CM | POA: Diagnosis not present

## 2015-02-06 DIAGNOSIS — H3532 Exudative age-related macular degeneration: Secondary | ICD-10-CM | POA: Diagnosis not present

## 2015-02-06 DIAGNOSIS — H40002 Preglaucoma, unspecified, left eye: Secondary | ICD-10-CM | POA: Diagnosis not present

## 2015-02-14 DIAGNOSIS — M47817 Spondylosis without myelopathy or radiculopathy, lumbosacral region: Secondary | ICD-10-CM | POA: Diagnosis not present

## 2015-02-14 DIAGNOSIS — M4806 Spinal stenosis, lumbar region: Secondary | ICD-10-CM | POA: Diagnosis not present

## 2015-02-14 DIAGNOSIS — M4306 Spondylolysis, lumbar region: Secondary | ICD-10-CM | POA: Diagnosis not present

## 2015-02-14 DIAGNOSIS — M5136 Other intervertebral disc degeneration, lumbar region: Secondary | ICD-10-CM | POA: Diagnosis not present

## 2015-02-27 ENCOUNTER — Telehealth: Payer: Self-pay | Admitting: Family Medicine

## 2015-02-27 NOTE — Telephone Encounter (Signed)
Caller name: Tu Bayle Relationship to patient: wife Can be reached: [phone] Pharmacy: [facility]  Address: [address], [place], [place], [place] [address]  [phone]   [place]. He had it about 1 year ago and the PA at [facility] prescribed 2 different meds for him. It is possible to have the meds called in for him?

## 2015-02-27 NOTE — Telephone Encounter (Signed)
Relation to pt: self  Call back number: [phone]  Pharmacy: [facility] [phone]   Reason for call:  Pt states this an urgent matter requesting RX.

## 2015-02-28 MED ORDER — FLUCONAZOLE 100 MG PO TABS: ORAL_TABLET | ORAL | Status: DC

## 2015-02-28 MED ORDER — MAGIC MOUTHWASH W/LIDOCAINE: 5.0000 mL | Freq: Four times a day (QID) | ORAL | Status: DC | PRN

## 2015-02-28 NOTE — Telephone Encounter (Signed)
I do not see any thrush treatments in chart but am willing to give him a trial of Diflucan 10 mg tab, 1 tab po once disp #1 and Nystatin liquid  Cc swish and spit, tid with meals. Disp 1 bottle. If no better needs to come in.

## 2015-02-28 NOTE — Telephone Encounter (Signed)
Sent in prescriptions as instructed and pharmacy/patient informed

## 2015-04-01 ENCOUNTER — Other Ambulatory Visit: Payer: Self-pay

## 2015-04-19 DIAGNOSIS — H2513 Age-related nuclear cataract, bilateral: Secondary | ICD-10-CM | POA: Diagnosis not present

## 2015-04-19 DIAGNOSIS — H40002 Preglaucoma, unspecified, left eye: Secondary | ICD-10-CM | POA: Diagnosis not present

## 2015-04-19 DIAGNOSIS — H3532 Exudative age-related macular degeneration: Secondary | ICD-10-CM | POA: Diagnosis not present

## 2015-04-19 DIAGNOSIS — H43812 Vitreous degeneration, left eye: Secondary | ICD-10-CM | POA: Diagnosis not present

## 2015-04-19 DIAGNOSIS — Z87891 Personal history of nicotine dependence: Secondary | ICD-10-CM | POA: Diagnosis not present

## 2015-04-19 DIAGNOSIS — H3531 Nonexudative age-related macular degeneration: Secondary | ICD-10-CM | POA: Diagnosis not present

## 2015-05-22 DIAGNOSIS — Z87891 Personal history of nicotine dependence: Secondary | ICD-10-CM | POA: Diagnosis not present

## 2015-05-22 DIAGNOSIS — H3531 Nonexudative age-related macular degeneration: Secondary | ICD-10-CM | POA: Diagnosis not present

## 2015-05-22 DIAGNOSIS — H43812 Vitreous degeneration, left eye: Secondary | ICD-10-CM | POA: Diagnosis not present

## 2015-05-22 DIAGNOSIS — H2513 Age-related nuclear cataract, bilateral: Secondary | ICD-10-CM | POA: Diagnosis not present

## 2015-05-22 DIAGNOSIS — H40002 Preglaucoma, unspecified, left eye: Secondary | ICD-10-CM | POA: Diagnosis not present

## 2015-05-22 DIAGNOSIS — H3532 Exudative age-related macular degeneration: Secondary | ICD-10-CM | POA: Diagnosis not present

## 2015-06-21 DIAGNOSIS — Z23 Encounter for immunization: Secondary | ICD-10-CM | POA: Diagnosis not present

## 2015-06-26 DIAGNOSIS — H3531 Nonexudative age-related macular degeneration: Secondary | ICD-10-CM | POA: Diagnosis not present

## 2015-06-26 DIAGNOSIS — H40002 Preglaucoma, unspecified, left eye: Secondary | ICD-10-CM | POA: Diagnosis not present

## 2015-06-26 DIAGNOSIS — H3532 Exudative age-related macular degeneration: Secondary | ICD-10-CM | POA: Diagnosis not present

## 2015-06-26 DIAGNOSIS — Z87891 Personal history of nicotine dependence: Secondary | ICD-10-CM | POA: Diagnosis not present

## 2015-06-26 DIAGNOSIS — H43812 Vitreous degeneration, left eye: Secondary | ICD-10-CM | POA: Diagnosis not present

## 2015-06-26 DIAGNOSIS — H2513 Age-related nuclear cataract, bilateral: Secondary | ICD-10-CM | POA: Diagnosis not present

## 2015-07-14 ENCOUNTER — Other Ambulatory Visit: Payer: Self-pay | Admitting: Family Medicine

## 2015-07-18 DIAGNOSIS — M47816 Spondylosis without myelopathy or radiculopathy, lumbar region: Secondary | ICD-10-CM | POA: Diagnosis not present

## 2015-07-18 DIAGNOSIS — M4806 Spinal stenosis, lumbar region: Secondary | ICD-10-CM | POA: Diagnosis not present

## 2015-07-18 DIAGNOSIS — M5136 Other intervertebral disc degeneration, lumbar region: Secondary | ICD-10-CM | POA: Diagnosis not present

## 2015-07-29 ENCOUNTER — Ambulatory Visit (INDEPENDENT_AMBULATORY_CARE_PROVIDER_SITE_OTHER): Payer: Medicare Other | Admitting: Family Medicine

## 2015-07-29 ENCOUNTER — Encounter: Payer: Self-pay | Admitting: Family Medicine

## 2015-07-29 ENCOUNTER — Other Ambulatory Visit: Payer: Self-pay | Admitting: Family Medicine

## 2015-07-29 VITALS — BP 134/80 | HR 69 | Temp 98.4°F | Ht 65.0 in | Wt 157.5 lb

## 2015-07-29 DIAGNOSIS — E782 Mixed hyperlipidemia: Secondary | ICD-10-CM

## 2015-07-29 DIAGNOSIS — K219 Gastro-esophageal reflux disease without esophagitis: Secondary | ICD-10-CM | POA: Diagnosis not present

## 2015-07-29 DIAGNOSIS — D649 Anemia, unspecified: Secondary | ICD-10-CM

## 2015-07-29 DIAGNOSIS — I10 Essential (primary) hypertension: Secondary | ICD-10-CM | POA: Diagnosis not present

## 2015-07-29 DIAGNOSIS — I2510 Atherosclerotic heart disease of native coronary artery without angina pectoris: Secondary | ICD-10-CM

## 2015-07-29 DIAGNOSIS — K625 Hemorrhage of anus and rectum: Secondary | ICD-10-CM

## 2015-07-29 DIAGNOSIS — N400 Enlarged prostate without lower urinary tract symptoms: Secondary | ICD-10-CM

## 2015-07-29 DIAGNOSIS — E785 Hyperlipidemia, unspecified: Secondary | ICD-10-CM

## 2015-07-29 DIAGNOSIS — B370 Candidal stomatitis: Secondary | ICD-10-CM

## 2015-07-29 DIAGNOSIS — D72819 Decreased white blood cell count, unspecified: Secondary | ICD-10-CM

## 2015-07-29 LAB — CBC
HCT: 41.5 % (ref 39.0–52.0)
Hemoglobin: 14 g/dL (ref 13.0–17.0)
MCHC: 33.8 g/dL (ref 30.0–36.0)
MCV: 90.5 fl (ref 78.0–100.0)
Platelets: 203 10*3/uL (ref 150.0–400.0)
RBC: 4.59 Mil/uL (ref 4.22–5.81)
RDW: 13.3 % (ref 11.5–15.5)
WBC: 11.5 10*3/uL — ABNORMAL HIGH (ref 4.0–10.5)

## 2015-07-29 LAB — COMPREHENSIVE METABOLIC PANEL
ALT: 16 U/L (ref 0–53)
AST: 17 U/L (ref 0–37)
Albumin: 4 g/dL (ref 3.5–5.2)
Alkaline Phosphatase: 46 U/L (ref 39–117)
BUN: 17 mg/dL (ref 6–23)
CO2: 29 mEq/L (ref 19–32)
Calcium: 9.9 mg/dL (ref 8.4–10.5)
Chloride: 102 mEq/L (ref 96–112)
Creatinine, Ser: 0.89 mg/dL (ref 0.40–1.50)
GFR: 90.2 mL/min (ref >60.00)
Glucose, Bld: 93 mg/dL (ref 70–99)
Potassium: 4.4 mEq/L (ref 3.5–5.1)
Sodium: 137 mEq/L (ref 135–145)
Total Bilirubin: 0.7 mg/dL (ref 0.2–1.2)
Total Protein: 7 g/dL (ref 6.0–8.3)

## 2015-07-29 LAB — LIPID PANEL
Cholesterol: 167 mg/dL (ref 0–200)
HDL: 57.9 mg/dL (ref >39.00)
LDL Cholesterol: 97 mg/dL (ref 0–99)
NonHDL: 108.73
Total CHOL/HDL Ratio: 3
Triglycerides: 60 mg/dL (ref 0.0–149.0)
VLDL: 12 mg/dL (ref 0.0–40.0)

## 2015-07-29 LAB — TSH: TSH: 1.39 u[IU]/mL (ref 0.35–4.50)

## 2015-07-29 MED ORDER — FLUCONAZOLE 100 MG PO TABS: ORAL_TABLET | ORAL | Status: DC

## 2015-07-29 MED ORDER — MAGIC MOUTHWASH W/LIDOCAINE: 5.0000 mL | Freq: Four times a day (QID) | ORAL | Status: DC | PRN

## 2015-07-29 NOTE — Patient Instructions (Addendum)
Hep C test check with insurance to see if they pay  NOW company 10 strain probiotic at [email or web address]  Basic Carbohydrate Counting for Diabetes Mellitus Carbohydrate counting is a method for keeping track of the amount of carbohydrates you eat. Eating carbohydrates naturally increases the level of sugar (glucose) in your blood, so it is important for you to know the amount that is okay for you to have in every meal. Carbohydrate counting helps keep the level of glucose in your blood within normal limits. The amount of carbohydrates allowed is different for every person. A dietitian can help you calculate the amount that is right for you. Once you know the amount of carbohydrates you can have, you can count the carbohydrates in the foods you want to eat. Carbohydrates are found in the following foods:  Grains, such as breads and cereals.  Dried beans and soy products.  Starchy vegetables, such as potatoes, peas, and corn.  Fruit and fruit juices.  Milk and yogurt.  Sweets and snack foods, such as cake, cookies, candy, chips, soft drinks, and fruit drinks. CARBOHYDRATE COUNTING There are two ways to count the carbohydrates in your food. You can use either of the methods or a combination of both. Reading the "Nutrition Facts" on [place] The "Nutrition Facts" is an area that is included on the labels of almost all packaged food and beverages in the [place]. It includes the serving size of that food or beverage and information about the nutrients in each serving of the food, including the grams (g) of carbohydrate per serving.  Decide the number of servings of this food or beverage that you will be able to eat or drink. Multiply that number of servings by the number of grams of carbohydrate that is listed on the label for that serving. The total will be the amount of carbohydrates you will be having when you eat or drink this food or beverage. Learning Standard Serving Sizes of  Food When you eat food that is not packaged or does not include "Nutrition Facts" on the label, you need to measure the servings in order to count the amount of carbohydrates.A serving of most carbohydrate-rich foods contains about 15 g of carbohydrates. The following list includes serving sizes of carbohydrate-rich foods that provide 15 g ofcarbohydrate per serving:   1 slice of bread (1 oz) or 1 six-inch tortilla.    of a hamburger bun or English muffin.  4-6 crackers.   cup unsweetened dry cereal.    cup hot cereal.   cup rice or pasta.    cup mashed potatoes or  of a large baked potato.  1 cup fresh fruit or one small piece of fruit.    cup canned or frozen fruit or fruit juice.  1 cup milk.   cup plain fat-free yogurt or yogurt sweetened with artificial sweeteners.   cup cooked dried beans or starchy vegetable, such as peas, corn, or potatoes.  Decide the number of standard-size servings that you will eat. Multiply that number of servings by 15 (the grams of carbohydrates in that serving). For example, if you eat 2 cups of strawberries, you will have eaten 2 servings and 30 g of carbohydrates (2 servings x 15 g = 30 g). For foods such as soups and casseroles, in which more than one food is mixed in, you will need to count the carbohydrates in each food that is included. EXAMPLE OF CARBOHYDRATE COUNTING Sample Dinner  3 oz chicken breast.  cup of brown rice.   cup of corn.  1 cup milk.   1 cup strawberries with sugar-free whipped topping.  Carbohydrate Calculation Step 1: Identify the foods that contain carbohydrates:   Rice.   Corn.   Milk.   Strawberries. Step 2:Calculate the number of servings eaten of each:   2 servings of rice.   1 serving of corn.   1 serving of milk.   1 serving of strawberries. Step 3: Multiply each of those number of servings by 15 g:   2 servings of rice x 15 g = 30 g.   1 serving of corn x 15 g  = 15 g.   1 serving of milk x 15 g = 15 g.   1 serving of strawberries x 15 g = 15 g. Step 4: Add together all of the amounts to find the total grams of carbohydrates eaten: 30 g + 15 g + 15 g + 15 g = 75 g.   This information is not intended to replace advice given to you by your health care provider. Make sure you discuss any questions you have with your health care provider.   Document Released: 09/21/2005 Document Revised: 10/12/2014 Document Reviewed: 08/18/2013 Elsevier Interactive Patient Education 2016 [employer]  Pneumovax PCV 23 valent will insurance Had pneumovax in 2008 and Prevnar PCV 13 in 2015

## 2015-07-29 NOTE — Telephone Encounter (Signed)
CBC ordered for 10/03/15

## 2015-07-29 NOTE — Progress Notes (Signed)
Pre visit review using our clinic review tool, if applicable. No additional management support is needed unless otherwise documented below in the visit note. 

## 2015-07-31 DIAGNOSIS — H353220 Exudative age-related macular degeneration, left eye, stage unspecified: Secondary | ICD-10-CM | POA: Diagnosis not present

## 2015-07-31 DIAGNOSIS — H43812 Vitreous degeneration, left eye: Secondary | ICD-10-CM | POA: Diagnosis not present

## 2015-07-31 DIAGNOSIS — H1012 Acute atopic conjunctivitis, left eye: Secondary | ICD-10-CM | POA: Diagnosis not present

## 2015-07-31 DIAGNOSIS — Z87891 Personal history of nicotine dependence: Secondary | ICD-10-CM | POA: Diagnosis not present

## 2015-07-31 DIAGNOSIS — H353110 Nonexudative age-related macular degeneration, right eye, stage unspecified: Secondary | ICD-10-CM | POA: Diagnosis not present

## 2015-07-31 DIAGNOSIS — H40002 Preglaucoma, unspecified, left eye: Secondary | ICD-10-CM | POA: Diagnosis not present

## 2015-07-31 DIAGNOSIS — I4891 Unspecified atrial fibrillation: Secondary | ICD-10-CM | POA: Diagnosis not present

## 2015-07-31 DIAGNOSIS — H2513 Age-related nuclear cataract, bilateral: Secondary | ICD-10-CM | POA: Diagnosis not present

## 2015-08-11 DIAGNOSIS — B370 Candidal stomatitis: Secondary | ICD-10-CM | POA: Insufficient documentation

## 2015-08-11 NOTE — Progress Notes (Signed)
Subjective:    Patient ID: Ryan [name], male    DOB: [birth year], 69 y.o.   MRN: [id number]  Chief Complaint  Patient presents with  . Follow-up    6 month    HPI Patient is in today for follow-up. Overall is doing fairly well. No recent illness. No hospitalizations. Has been struggling recently with some thrush again. Has used nystatin in the past with good results. Will up early in the week with a sick white coating on his tongue and has restarted his medications. Rosacea has also flared but has been responsive to sulfites. Is managing his constipation with Amity's from his gastroenterologist. Denies CP/palp/SOB/HA/congestion/fevers/GI or GU c/o. Taking meds as prescribed  Past Medical History  Diagnosis Date  . H/O: rheumatic fever   . Kidney stones   . GERD (gastroesophageal reflux disease)   . Heart murmur   . Anemia   . Hypertension   . Macular degeneration   . Hyperlipidemia   . History of colonoscopy   . Heart murmur   . COPD (chronic obstructive pulmonary disease) ([place])   . Atrial flutter ([place]) 03/31/2012    converted spontaneously to sinus  . High risk medication use     on amiodarone since 03/31/2012  . CAD (coronary artery disease)     reportedly moderate CAD; managed medically  . Eczema 08/03/2014    Past Surgical History  Procedure Laterality Date  . Cervical discectomy      C5,6,7 disc fused  with plate and 5 1" screws  . Cholecystectomy    . Mouth surgery      periodontal surgery, bridges, splint in front,   . Knee cartiledge Right     right knee  . Foot surgery      right calcification removed from top of foot  . Appendectomy    . Cardiac catheterization  04/22/2011    moderate left main and RCA stenosis not significant by FFR and IVUS on medical therapy    Family History  Problem Relation Age of Onset  . Liver disease    . Prostate cancer    . Coronary artery disease    . Heart disease Mother   . Diabetes Mother   . Cirrhosis Mother   .  Emphysema Mother     never smoked but 2nd hand through her spouse  . Hypertension Mother   . Macular degeneration Mother   . Heart disease Father   . Cancer Father     prostate  . Hyperlipidemia Father   . Hypertension Father   . Varicose Veins Father   . Heart attack Father   . Peripheral vascular disease Father   . Heart disease Sister   . Arthritis Sister   . Hyperlipidemia Sister   . Obesity Sister   . Macular degeneration Sister   . Heart disease Brother     5 stents  . Hyperlipidemia Brother   . Macular degeneration Maternal Grandfather   . Cirrhosis Sister   . Obesity Sister   . Arthritis Sister   . Heart disease Sister   . Obesity Sister     Social History   Social History  . Marital Status: Married    Spouse Name: N/A  . Number of Children: 0  . Years of Education: N/A   Occupational History  . retired    Social History Main Topics  . Smoking status: Former Smoker -- 0.50 packs/day for 50 years    Types: Cigarettes  Quit date: 05/01/2013  . Smokeless tobacco: Former Systems developer     Comment: using e-Cig//ldc  . Alcohol Use: No  . Drug Use: No  . Sexual Activity: Yes     Comment: lives with wife, no dietary restrictions   Other Topics Concern  . Not on file   Social History Narrative    Outpatient Prescriptions Prior to Visit  Medication Sig Dispense Refill  . apixaban (ELIQUIS) 5 MG TABS tablet Take 1 tablet (5 mg total) by mouth 2 (two) times daily. 180 tablet 3  . clobetasol (TEMOVATE) 0.05 % external solution Apply 1 application topically 2 (two) times daily. 50 mL 3  . lubiprostone (AMITIZA) 8 MCG capsule Take 1-2 tablets a day    . Multiple Vitamins-Minerals (ICAPS MV) TABS Take 1 tablet by mouth 2 (two) times daily.     Marland Kitchen omeprazole (PRILOSEC OTC) 20 MG tablet Take 20 mg by mouth daily.    . Ranibizumab (LUCENTIS IO) Inject into the eye as needed (Macular degeneration).     Marland Kitchen ZETIA 10 MG tablet Take 1 tablet by mouth  daily with supper 90  tablet 1  . Alum & Mag Hydroxide-Simeth (MAGIC MOUTHWASH W/LIDOCAINE) SOLN Take 5 mLs by mouth 4 (four) times daily as needed for mouth pain. 5 mL 1  . benzonatate (TESSALON) 200 MG capsule Take 1 capsule (200 mg total) by mouth at bedtime. Take as needed for cough 12 capsule 0  . fluconazole (DIFLUCAN) 100 MG tablet Take 100 mg by mouth daily.    . fluconazole (DIFLUCAN) 100 MG tablet Take one by mouth 1 tablet 1   No facility-administered medications prior to visit.    Allergies  Allergen Reactions  . Polymyxin B-Trimethoprim Swelling    Eye drops made eyes swell  . Atorvastatin Other (See Comments)    Muscle aches and cramps  . Avelox [Moxifloxacin] Other (See Comments)    Stomach cramps  . Guaifenesin & Derivatives   . Mucinex D [Pseudoephedrine-Guaifenesin Er] Nausea And Vomiting    Stomach cramps  . Polymyxin B Other (See Comments)    Swelling  . Pseudoephedrine   . Simvastatin Other (See Comments)    Muscle aches and cramps  . Statins Other (See Comments)    Muscle aches  . Vioxx [Rofecoxib] Other (See Comments)    Stomach cramping  . Ciprofloxacin Itching and Rash  . Codeine Phosphate Itching and Rash    ROS     Objective:    Physical Exam  BP 134/80 mmHg  Pulse 69  Temp(Src) 98.4 F (36.9 C) (Oral)  Ht '5\' 5"'$  (1.651 m)  Wt 157 lb 8 oz (71.442 kg)  BMI 26.21 kg/m2  SpO2 98% Wt Readings from Last 3 Encounters:  07/29/15 157 lb 8 oz (71.442 kg)  01/14/15 165 lb (74.844 kg)  09/08/14 162 lb (73.483 kg)     Lab Results  Component Value Date   WBC 11.5* 07/29/2015   HGB 14.0 07/29/2015   HCT 41.5 07/29/2015   PLT 203.0 07/29/2015   GLUCOSE 93 07/29/2015   CHOL 167 07/29/2015   TRIG 60.0 07/29/2015   HDL 57.90 07/29/2015   LDLDIRECT 134.2 12/01/2013   LDLCALC 97 07/29/2015   ALT 16 07/29/2015   AST 17 07/29/2015   NA 137 07/29/2015   K 4.4 07/29/2015   CL 102 07/29/2015   CREATININE 0.89 07/29/2015   BUN 17 07/29/2015   CO2 29 07/29/2015    TSH 1.39 07/29/2015   PSA 1.64 01/14/2015  INR 1.17 04/01/2012    Lab Results  Component Value Date   TSH 1.39 07/29/2015   Lab Results  Component Value Date   WBC 11.5* 07/29/2015   HGB 14.0 07/29/2015   HCT 41.5 07/29/2015   MCV 90.5 07/29/2015   PLT 203.0 07/29/2015   Lab Results  Component Value Date   NA 137 07/29/2015   K 4.4 07/29/2015   CO2 29 07/29/2015   GLUCOSE 93 07/29/2015   BUN 17 07/29/2015   CREATININE 0.89 07/29/2015   BILITOT 0.7 07/29/2015   ALKPHOS 46 07/29/2015   AST 17 07/29/2015   ALT 16 07/29/2015   PROT 7.0 07/29/2015   ALBUMIN 4.0 07/29/2015   CALCIUM 9.9 07/29/2015   GFR 90.20 07/29/2015   Lab Results  Component Value Date   CHOL 167 07/29/2015   Lab Results  Component Value Date   HDL 57.90 07/29/2015   Lab Results  Component Value Date   LDLCALC 97 07/29/2015   Lab Results  Component Value Date   TRIG 60.0 07/29/2015   Lab Results  Component Value Date   CHOLHDL 3 07/29/2015   No results found for: HGBA1C     Assessment & Plan:   Problem List Items Addressed This Visit    Thrush - Primary    Magic mouthwash prescribed. Probiotics suggested and minimize carbohydrate intake      Relevant Medications   fluconazole (DIFLUCAN) 100 MG tablet   magic mouthwash w/lidocaine SOLN   Other Relevant Orders   TSH (Completed)   CBC (Completed)   Comprehensive metabolic panel (Completed)   Rectal bleeding   Relevant Medications   fluconazole (DIFLUCAN) 100 MG tablet   magic mouthwash w/lidocaine SOLN   Other Relevant Orders   TSH (Completed)   CBC (Completed)   Comprehensive metabolic panel (Completed)   Hyperlipidemia, mixed    Encouraged heart healthy diet, increase exercise, avoid trans fats, consider a krill oil cap daily      GERD    Follows with Dr Earlean Shawl. Continue Omeprazole      Relevant Medications   fluconazole (DIFLUCAN) 100 MG tablet   magic mouthwash w/lidocaine SOLN   Other Relevant Orders   TSH  (Completed)   CBC (Completed)   Comprehensive metabolic panel (Completed)   Essential hypertension    Well controlled, no changes to meds. Encouraged heart healthy diet such as the DASH diet and exercise as tolerated.       Relevant Medications   fluconazole (DIFLUCAN) 100 MG tablet   magic mouthwash w/lidocaine SOLN   Other Relevant Orders   TSH (Completed)   CBC (Completed)   Comprehensive metabolic panel (Completed)   CAD (coronary atherosclerotic disease)    Follows with cardiology as well, controlling risk factors well per surveillance. willc ontinue to monitor      BPH (benign prostatic hyperplasia)    Doing well, PSA stable, does endorse some night time awakenings but denies hesitancy or pain       Other Visit Diagnoses    Anemia, unspecified anemia type        Relevant Orders    CBC (Completed)    Hyperlipidemia, mild        Relevant Orders    Lipid panel (Completed)       I have discontinued Mr. Burningham benzonatate. I have also changed his magic mouthwash w/lidocaine. Additionally, I am having him maintain his ICAPS MV, lubiprostone, omeprazole, Ranibizumab (LUCENTIS IO), apixaban, clobetasol, ZETIA, and fluconazole.  Meds ordered this encounter  Medications  . fluconazole (DIFLUCAN) 100 MG tablet    Sig: Take one by mouth    Dispense:  1 tablet    Refill:  5  . magic mouthwash w/lidocaine SOLN    Sig: Take 5 mLs by mouth 4 (four) times daily as needed for mouth pain.    Dispense:  5 mL    Refill:  1     Meliah Appleman, MD

## 2015-08-11 NOTE — Assessment & Plan Note (Signed)
Follows with cardiology as well, controlling risk factors well per surveillance. willc ontinue to monitor

## 2015-08-11 NOTE — Assessment & Plan Note (Signed)
Well controlled, no changes to meds. Encouraged heart healthy diet such as the DASH diet and exercise as tolerated.  °

## 2015-08-11 NOTE — Assessment & Plan Note (Signed)
Magic mouthwash prescribed. Probiotics suggested and minimize carbohydrate intake

## 2015-08-11 NOTE — Assessment & Plan Note (Signed)
Doing well, PSA stable, does endorse some night time awakenings but denies hesitancy or pain

## 2015-08-11 NOTE — Assessment & Plan Note (Signed)
Follows with Dr Earlean Shawl. Continue Omeprazole

## 2015-08-11 NOTE — Assessment & Plan Note (Signed)
Encouraged heart healthy diet, increase exercise, avoid trans fats, consider a krill oil cap daily 

## 2015-08-22 DIAGNOSIS — M5136 Other intervertebral disc degeneration, lumbar region: Secondary | ICD-10-CM | POA: Diagnosis not present

## 2015-08-22 DIAGNOSIS — M4726 Other spondylosis with radiculopathy, lumbar region: Secondary | ICD-10-CM | POA: Diagnosis not present

## 2015-08-22 DIAGNOSIS — M4806 Spinal stenosis, lumbar region: Secondary | ICD-10-CM | POA: Diagnosis not present

## 2015-08-22 DIAGNOSIS — Z6826 Body mass index (BMI) 26.0-26.9, adult: Secondary | ICD-10-CM | POA: Diagnosis not present

## 2015-08-22 DIAGNOSIS — R030 Elevated blood-pressure reading, without diagnosis of hypertension: Secondary | ICD-10-CM | POA: Diagnosis not present

## 2015-08-22 DIAGNOSIS — M5416 Radiculopathy, lumbar region: Secondary | ICD-10-CM | POA: Diagnosis not present

## 2015-08-24 DIAGNOSIS — M4806 Spinal stenosis, lumbar region: Secondary | ICD-10-CM | POA: Diagnosis not present

## 2015-08-24 DIAGNOSIS — M5126 Other intervertebral disc displacement, lumbar region: Secondary | ICD-10-CM | POA: Diagnosis not present

## 2015-09-02 ENCOUNTER — Other Ambulatory Visit: Payer: Self-pay | Admitting: Internal Medicine

## 2015-09-04 DIAGNOSIS — H43812 Vitreous degeneration, left eye: Secondary | ICD-10-CM | POA: Diagnosis not present

## 2015-09-04 DIAGNOSIS — H2513 Age-related nuclear cataract, bilateral: Secondary | ICD-10-CM | POA: Diagnosis not present

## 2015-09-04 DIAGNOSIS — H353190 Nonexudative age-related macular degeneration, unspecified eye, stage unspecified: Secondary | ICD-10-CM | POA: Diagnosis not present

## 2015-09-04 DIAGNOSIS — H40002 Preglaucoma, unspecified, left eye: Secondary | ICD-10-CM | POA: Diagnosis not present

## 2015-09-04 DIAGNOSIS — Z87891 Personal history of nicotine dependence: Secondary | ICD-10-CM | POA: Diagnosis not present

## 2015-09-04 DIAGNOSIS — H353221 Exudative age-related macular degeneration, left eye, with active choroidal neovascularization: Secondary | ICD-10-CM | POA: Diagnosis not present

## 2015-09-04 DIAGNOSIS — H1012 Acute atopic conjunctivitis, left eye: Secondary | ICD-10-CM | POA: Diagnosis not present

## 2015-09-05 DIAGNOSIS — M546 Pain in thoracic spine: Secondary | ICD-10-CM | POA: Diagnosis not present

## 2015-09-05 DIAGNOSIS — Z6827 Body mass index (BMI) 27.0-27.9, adult: Secondary | ICD-10-CM | POA: Diagnosis not present

## 2015-09-05 DIAGNOSIS — M4726 Other spondylosis with radiculopathy, lumbar region: Secondary | ICD-10-CM | POA: Diagnosis not present

## 2015-09-05 DIAGNOSIS — M5416 Radiculopathy, lumbar region: Secondary | ICD-10-CM | POA: Diagnosis not present

## 2015-09-05 DIAGNOSIS — M4806 Spinal stenosis, lumbar region: Secondary | ICD-10-CM | POA: Diagnosis not present

## 2015-09-05 DIAGNOSIS — M5136 Other intervertebral disc degeneration, lumbar region: Secondary | ICD-10-CM | POA: Diagnosis not present

## 2015-09-05 DIAGNOSIS — R030 Elevated blood-pressure reading, without diagnosis of hypertension: Secondary | ICD-10-CM | POA: Diagnosis not present

## 2015-09-12 DIAGNOSIS — M5136 Other intervertebral disc degeneration, lumbar region: Secondary | ICD-10-CM | POA: Diagnosis not present

## 2015-09-12 DIAGNOSIS — M4726 Other spondylosis with radiculopathy, lumbar region: Secondary | ICD-10-CM | POA: Diagnosis not present

## 2015-09-23 ENCOUNTER — Other Ambulatory Visit (INDEPENDENT_AMBULATORY_CARE_PROVIDER_SITE_OTHER): Payer: Medicare Other

## 2015-09-23 DIAGNOSIS — D72819 Decreased white blood cell count, unspecified: Secondary | ICD-10-CM

## 2015-09-23 LAB — CBC WITH DIFFERENTIAL/PLATELET
Basophils Absolute: 0 10*3/uL (ref 0.0–0.1)
Basophils Relative: 0.3 % (ref 0.0–3.0)
Eosinophils Absolute: 0.1 10*3/uL (ref 0.0–0.7)
Eosinophils Relative: 1.1 % (ref 0.0–5.0)
HCT: 39.9 % (ref 39.0–52.0)
Hemoglobin: 13.5 g/dL (ref 13.0–17.0)
Lymphocytes Relative: 17.2 % (ref 12.0–46.0)
Lymphs Abs: 1.8 10*3/uL (ref 0.7–4.0)
MCHC: 33.9 g/dL (ref 30.0–36.0)
MCV: 90.8 fl (ref 78.0–100.0)
Monocytes Absolute: 0.9 10*3/uL (ref 0.1–1.0)
Monocytes Relative: 9.1 % (ref 3.0–12.0)
Neutro Abs: 7.5 10*3/uL (ref 1.4–7.7)
Neutrophils Relative %: 72.3 % (ref 43.0–77.0)
Platelets: 200 10*3/uL (ref 150.0–400.0)
RBC: 4.4 Mil/uL (ref 4.22–5.81)
RDW: 13.6 % (ref 11.5–15.5)
WBC: 10.4 10*3/uL (ref 4.0–10.5)

## 2015-09-24 ENCOUNTER — Other Ambulatory Visit: Payer: Self-pay | Admitting: Family Medicine

## 2015-10-23 DIAGNOSIS — H353221 Exudative age-related macular degeneration, left eye, with active choroidal neovascularization: Secondary | ICD-10-CM | POA: Diagnosis not present

## 2015-10-23 DIAGNOSIS — H1012 Acute atopic conjunctivitis, left eye: Secondary | ICD-10-CM | POA: Diagnosis not present

## 2015-10-23 DIAGNOSIS — H353220 Exudative age-related macular degeneration, left eye, stage unspecified: Secondary | ICD-10-CM | POA: Diagnosis not present

## 2015-10-23 DIAGNOSIS — H353110 Nonexudative age-related macular degeneration, right eye, stage unspecified: Secondary | ICD-10-CM | POA: Diagnosis not present

## 2015-10-23 DIAGNOSIS — H43812 Vitreous degeneration, left eye: Secondary | ICD-10-CM | POA: Diagnosis not present

## 2015-10-23 DIAGNOSIS — Z87891 Personal history of nicotine dependence: Secondary | ICD-10-CM | POA: Diagnosis not present

## 2015-10-23 DIAGNOSIS — Z9841 Cataract extraction status, right eye: Secondary | ICD-10-CM | POA: Diagnosis not present

## 2015-10-23 DIAGNOSIS — H2513 Age-related nuclear cataract, bilateral: Secondary | ICD-10-CM | POA: Diagnosis not present

## 2015-10-23 DIAGNOSIS — Z9842 Cataract extraction status, left eye: Secondary | ICD-10-CM | POA: Diagnosis not present

## 2015-10-31 DIAGNOSIS — H40032 Anatomical narrow angle, left eye: Secondary | ICD-10-CM | POA: Diagnosis not present

## 2015-10-31 DIAGNOSIS — H2513 Age-related nuclear cataract, bilateral: Secondary | ICD-10-CM | POA: Diagnosis not present

## 2015-10-31 DIAGNOSIS — H401124 Primary open-angle glaucoma, left eye, indeterminate stage: Secondary | ICD-10-CM | POA: Diagnosis not present

## 2015-10-31 DIAGNOSIS — Z87891 Personal history of nicotine dependence: Secondary | ICD-10-CM | POA: Diagnosis not present

## 2015-10-31 DIAGNOSIS — Z7901 Long term (current) use of anticoagulants: Secondary | ICD-10-CM | POA: Diagnosis not present

## 2015-10-31 DIAGNOSIS — H353221 Exudative age-related macular degeneration, left eye, with active choroidal neovascularization: Secondary | ICD-10-CM | POA: Diagnosis not present

## 2015-10-31 DIAGNOSIS — H538 Other visual disturbances: Secondary | ICD-10-CM | POA: Diagnosis not present

## 2015-10-31 DIAGNOSIS — Z79899 Other long term (current) drug therapy: Secondary | ICD-10-CM | POA: Diagnosis not present

## 2015-11-05 DIAGNOSIS — M4726 Other spondylosis with radiculopathy, lumbar region: Secondary | ICD-10-CM | POA: Diagnosis not present

## 2015-11-05 DIAGNOSIS — M4806 Spinal stenosis, lumbar region: Secondary | ICD-10-CM | POA: Diagnosis not present

## 2015-11-05 DIAGNOSIS — M5416 Radiculopathy, lumbar region: Secondary | ICD-10-CM | POA: Diagnosis not present

## 2015-11-05 DIAGNOSIS — M5136 Other intervertebral disc degeneration, lumbar region: Secondary | ICD-10-CM | POA: Diagnosis not present

## 2015-11-07 ENCOUNTER — Telehealth: Payer: Self-pay | Admitting: Pharmacist

## 2015-11-07 NOTE — Telephone Encounter (Signed)
Received fax from [facility] regarding upcoming laser and medicinal treatment for glaucoma. Pt on Eliquis for afib, CHADS2 score of 1. Usually do not hold anticoag therapy for glaucoma treatment, however given provider's request and pt's low risk for stroke, will have pt hold Eliquis x24 hours and restart 24 hours after procedure or as directed. Also ok'ed pt to take Timolol eyedrops. Clearance faxed 11/07/15 to [phone].

## 2015-11-28 DIAGNOSIS — H66002 Acute suppurative otitis media without spontaneous rupture of ear drum, left ear: Secondary | ICD-10-CM | POA: Diagnosis not present

## 2015-11-29 DIAGNOSIS — H40032 Anatomical narrow angle, left eye: Secondary | ICD-10-CM | POA: Diagnosis not present

## 2015-12-03 ENCOUNTER — Other Ambulatory Visit: Payer: Self-pay | Admitting: Family Medicine

## 2015-12-04 DIAGNOSIS — H2513 Age-related nuclear cataract, bilateral: Secondary | ICD-10-CM | POA: Diagnosis not present

## 2015-12-04 DIAGNOSIS — Z9889 Other specified postprocedural states: Secondary | ICD-10-CM | POA: Diagnosis not present

## 2015-12-04 DIAGNOSIS — H353190 Nonexudative age-related macular degeneration, unspecified eye, stage unspecified: Secondary | ICD-10-CM | POA: Diagnosis not present

## 2015-12-04 DIAGNOSIS — H353221 Exudative age-related macular degeneration, left eye, with active choroidal neovascularization: Secondary | ICD-10-CM | POA: Diagnosis not present

## 2015-12-04 DIAGNOSIS — H353110 Nonexudative age-related macular degeneration, right eye, stage unspecified: Secondary | ICD-10-CM | POA: Diagnosis not present

## 2015-12-04 DIAGNOSIS — Z87891 Personal history of nicotine dependence: Secondary | ICD-10-CM | POA: Diagnosis not present

## 2015-12-20 DIAGNOSIS — H21301 Idiopathic cysts of iris, ciliary body or anterior chamber, right eye: Secondary | ICD-10-CM | POA: Diagnosis not present

## 2015-12-20 DIAGNOSIS — Z9889 Other specified postprocedural states: Secondary | ICD-10-CM | POA: Diagnosis not present

## 2015-12-20 DIAGNOSIS — Z87891 Personal history of nicotine dependence: Secondary | ICD-10-CM | POA: Diagnosis not present

## 2015-12-20 DIAGNOSIS — H353221 Exudative age-related macular degeneration, left eye, with active choroidal neovascularization: Secondary | ICD-10-CM | POA: Diagnosis not present

## 2015-12-20 DIAGNOSIS — H401124 Primary open-angle glaucoma, left eye, indeterminate stage: Secondary | ICD-10-CM | POA: Diagnosis not present

## 2015-12-20 DIAGNOSIS — H2513 Age-related nuclear cataract, bilateral: Secondary | ICD-10-CM | POA: Diagnosis not present

## 2015-12-20 DIAGNOSIS — Z48810 Encounter for surgical aftercare following surgery on the sense organs: Secondary | ICD-10-CM | POA: Diagnosis not present

## 2016-01-09 DIAGNOSIS — M4726 Other spondylosis with radiculopathy, lumbar region: Secondary | ICD-10-CM | POA: Diagnosis not present

## 2016-01-09 DIAGNOSIS — M4806 Spinal stenosis, lumbar region: Secondary | ICD-10-CM | POA: Diagnosis not present

## 2016-01-09 DIAGNOSIS — M5136 Other intervertebral disc degeneration, lumbar region: Secondary | ICD-10-CM | POA: Diagnosis not present

## 2016-01-15 DIAGNOSIS — H353110 Nonexudative age-related macular degeneration, right eye, stage unspecified: Secondary | ICD-10-CM | POA: Diagnosis not present

## 2016-01-15 DIAGNOSIS — H353221 Exudative age-related macular degeneration, left eye, with active choroidal neovascularization: Secondary | ICD-10-CM | POA: Diagnosis not present

## 2016-01-15 DIAGNOSIS — H353190 Nonexudative age-related macular degeneration, unspecified eye, stage unspecified: Secondary | ICD-10-CM | POA: Diagnosis not present

## 2016-01-15 DIAGNOSIS — Z87891 Personal history of nicotine dependence: Secondary | ICD-10-CM | POA: Diagnosis not present

## 2016-01-15 DIAGNOSIS — H2513 Age-related nuclear cataract, bilateral: Secondary | ICD-10-CM | POA: Diagnosis not present

## 2016-01-27 ENCOUNTER — Ambulatory Visit: Payer: PRIVATE HEALTH INSURANCE | Admitting: Family Medicine

## 2016-02-02 ENCOUNTER — Other Ambulatory Visit: Payer: Self-pay | Admitting: Internal Medicine

## 2016-02-26 DIAGNOSIS — H2513 Age-related nuclear cataract, bilateral: Secondary | ICD-10-CM | POA: Diagnosis not present

## 2016-02-26 DIAGNOSIS — Z87891 Personal history of nicotine dependence: Secondary | ICD-10-CM | POA: Diagnosis not present

## 2016-02-26 DIAGNOSIS — H401124 Primary open-angle glaucoma, left eye, indeterminate stage: Secondary | ICD-10-CM | POA: Diagnosis not present

## 2016-02-26 DIAGNOSIS — H353221 Exudative age-related macular degeneration, left eye, with active choroidal neovascularization: Secondary | ICD-10-CM | POA: Diagnosis not present

## 2016-02-26 DIAGNOSIS — H353110 Nonexudative age-related macular degeneration, right eye, stage unspecified: Secondary | ICD-10-CM | POA: Diagnosis not present

## 2016-02-26 DIAGNOSIS — H35361 Drusen (degenerative) of macula, right eye: Secondary | ICD-10-CM | POA: Diagnosis not present

## 2016-03-15 ENCOUNTER — Other Ambulatory Visit: Payer: Self-pay | Admitting: Family Medicine

## 2016-03-16 DIAGNOSIS — J018 Other acute sinusitis: Secondary | ICD-10-CM | POA: Diagnosis not present

## 2016-03-16 DIAGNOSIS — H66002 Acute suppurative otitis media without spontaneous rupture of ear drum, left ear: Secondary | ICD-10-CM | POA: Diagnosis not present

## 2016-03-16 DIAGNOSIS — H66012 Acute suppurative otitis media with spontaneous rupture of ear drum, left ear: Secondary | ICD-10-CM | POA: Diagnosis not present

## 2016-03-19 ENCOUNTER — Emergency Department (HOSPITAL_BASED_OUTPATIENT_CLINIC_OR_DEPARTMENT_OTHER)
Admission: EM | Admit: 2016-03-19 | Discharge: 2016-03-19 | Disposition: A | Discharge status: Home / Self Care | Payer: Medicare Other | Attending: Emergency Medicine | Admitting: Emergency Medicine

## 2016-03-19 ENCOUNTER — Emergency Department (HOSPITAL_BASED_OUTPATIENT_CLINIC_OR_DEPARTMENT_OTHER): Payer: Medicare Other

## 2016-03-19 ENCOUNTER — Encounter (HOSPITAL_BASED_OUTPATIENT_CLINIC_OR_DEPARTMENT_OTHER): Payer: Self-pay | Admitting: *Deleted

## 2016-03-19 DIAGNOSIS — J40 Bronchitis, not specified as acute or chronic: Secondary | ICD-10-CM | POA: Diagnosis not present

## 2016-03-19 DIAGNOSIS — I10 Essential (primary) hypertension: Secondary | ICD-10-CM | POA: Insufficient documentation

## 2016-03-19 DIAGNOSIS — I2510 Atherosclerotic heart disease of native coronary artery without angina pectoris: Secondary | ICD-10-CM | POA: Diagnosis not present

## 2016-03-19 DIAGNOSIS — J449 Chronic obstructive pulmonary disease, unspecified: Secondary | ICD-10-CM | POA: Insufficient documentation

## 2016-03-19 DIAGNOSIS — R05 Cough: Secondary | ICD-10-CM | POA: Diagnosis not present

## 2016-03-19 DIAGNOSIS — Z87891 Personal history of nicotine dependence: Secondary | ICD-10-CM | POA: Insufficient documentation

## 2016-03-19 DIAGNOSIS — Z79899 Other long term (current) drug therapy: Secondary | ICD-10-CM | POA: Insufficient documentation

## 2016-03-19 DIAGNOSIS — E785 Hyperlipidemia, unspecified: Secondary | ICD-10-CM | POA: Insufficient documentation

## 2016-03-19 MED ORDER — PREDNISONE 20 MG PO TABS: 40.0000 mg | ORAL_TABLET | Freq: Every day | ORAL | Status: DC

## 2016-03-19 MED ORDER — ALBUTEROL SULFATE HFA 108 (90 BASE) MCG/ACT IN AERS
1.0000 | INHALATION_SPRAY | RESPIRATORY_TRACT | Status: DC | PRN
  Administered 2016-03-19: 1 via RESPIRATORY_TRACT
  Filled 2016-03-19: qty 6.7

## 2016-03-19 MED FILL — predniSONE 20 MG TABS: 20 | 5 days supply | Qty: 10 | Fill #0

## 2016-03-19 NOTE — ED Provider Notes (Signed)
CSN: [id number]     Arrival date & time 03/19/16  1316 History   First MD Initiated Contact with Patient 03/19/16 1324     Chief Complaint  Patient presents with  . Cough     (Consider location/radiation/quality/duration/timing/severity/associated sxs/prior Treatment) Patient is a 70 y.o. male presenting with cough. The history is provided by the patient.  Cough Cough characteristics:  Productive Sputum characteristics:  White (clear) Severity:  Moderate Onset quality:  Gradual Duration:  6 days Timing:  Constant Progression:  Worsening Chronicity:  New Smoker: no (Former smoker quit 3 years ago)   Context: sick contacts and upper respiratory infection   Context comment:  Thursday patient was exposed to a child with URI. He started developing symptoms on Saturday Relieved by:  Nothing Worsened by:  Nothing tried Ineffective treatments: [facility] and [facility]. Associated symptoms: rhinorrhea and sinus congestion   Associated symptoms: no chills, no fever, no headaches, no myalgias and no wheezing   Associated symptoms comment:  Occasional shortness of breath when he starts coughing hard Risk factors: no recent infection and no recent travel     Past Medical History  Diagnosis Date  . H/O: rheumatic fever   . Kidney stones   . GERD (gastroesophageal reflux disease)   . Heart murmur   . Anemia   . Hypertension   . Macular degeneration   . Hyperlipidemia   . History of colonoscopy   . Heart murmur   . COPD (chronic obstructive pulmonary disease) ([place])   . Atrial flutter ([place]) 03/31/2012    converted spontaneously to sinus  . High risk medication use     on amiodarone since 03/31/2012  . CAD (coronary artery disease)     reportedly moderate CAD; managed medically  . Eczema 08/03/2014   Past Surgical History  Procedure Laterality Date  . Cervical discectomy      C5,6,7 disc fused  with plate and 5 1" screws  . Cholecystectomy    . Mouth  surgery      periodontal surgery, bridges, splint in front,   . Knee cartiledge Right     right knee  . Foot surgery      right calcification removed from top of foot  . Appendectomy    . Cardiac catheterization  04/22/2011    moderate left main and RCA stenosis not significant by FFR and IVUS on medical therapy   Family History  Problem Relation Age of Onset  . Liver disease    . Prostate cancer    . Coronary artery disease    . Heart disease Mother   . Diabetes Mother   . Cirrhosis Mother   . Emphysema Mother     never smoked but 2nd hand through her spouse  . Hypertension Mother   . Macular degeneration Mother   . Heart disease Father   . Cancer Father     prostate  . Hyperlipidemia Father   . Hypertension Father   . Varicose Veins Father   . Heart attack Father   . Peripheral vascular disease Father   . Heart disease Sister   . Arthritis Sister   . Hyperlipidemia Sister   . Obesity Sister   . Macular degeneration Sister   . Heart disease Brother     5 stents  . Hyperlipidemia Brother   . Macular degeneration Maternal Grandfather   . Cirrhosis Sister   . Obesity Sister   . Arthritis Sister   .  Heart disease Sister   . Obesity Sister    Social History  Substance Use Topics  . Smoking status: Former Smoker -- 0.50 packs/day for 50 years    Types: Cigarettes    Quit date: 05/01/2013  . Smokeless tobacco: Former Systems developer     Comment: using e-Cig//ldc  . Alcohol Use: No    Review of Systems  Constitutional: Negative for fever and chills.  HENT: Positive for rhinorrhea.   Respiratory: Positive for cough. Negative for wheezing.   Musculoskeletal: Negative for myalgias.  Neurological: Negative for headaches.  All other systems reviewed and are negative.     Allergies  Polymyxin b-trimethoprim; Atorvastatin; Avelox; Guaifenesin & derivatives; Mucinex d; Polymyxin b; Pseudoephedrine; Simvastatin; Statins; Vioxx; Ciprofloxacin; and Codeine phosphate  Home  Medications   Prior to Admission medications   Medication Sig Start Date End Date Taking? Authorizing Provider  latanoprost (XALATAN) 0.005 % ophthalmic solution 1 drop at bedtime.   Yes Historical Provider, MD  clobetasol (TEMOVATE) 0.05 % external solution APPLY 1 APPLICATION TOPICALLY TWICE DAILY 09/24/15   [name], MD  ELIQUIS 5 MG TABS tablet Take 1 tablet by mouth two  times daily 02/04/16   Evans Lance, MD  fluconazole (DIFLUCAN) 100 MG tablet Take one by mouth 07/29/15   [name], MD  lubiprostone (AMITIZA) 8 MCG capsule Take 1-2 tablets a day    Historical Provider, MD  magic mouthwash w/lidocaine SOLN Take 5 mLs by mouth 4 (four) times daily as needed for mouth pain. 07/29/15   [name], MD  Multiple Vitamins-Minerals (ICAPS MV) TABS Take 1 tablet by mouth 2 (two) times daily.     Historical Provider, MD  omeprazole (PRILOSEC OTC) 20 MG tablet Take 20 mg by mouth daily.    Historical Provider, MD  Ranibizumab (LUCENTIS IO) Inject into the eye as needed (Macular degeneration).     Historical Provider, MD  ZETIA 10 MG tablet Take 1 tablet by mouth  daily with supper 03/15/16   [name], MD   BP 139/78 mmHg  Pulse 74  Temp(Src) 97.6 F (36.4 C) (Oral)  Resp 18  Ht '5\' 4"'$  (1.626 m)  Wt 155 lb (70.308 kg)  BMI 26.59 kg/m2  SpO2 98% Physical Exam  Constitutional: He is oriented to person, place, and time. He appears well-developed and well-nourished. No distress.  HENT:  Head: Normocephalic and atraumatic.  Right Ear: Tympanic membrane normal.  Left Ear: Tympanic membrane normal.  Nose: Mucosal edema and rhinorrhea present.  Mouth/Throat: Oropharynx is clear and moist. No posterior oropharyngeal edema or posterior oropharyngeal erythema.  Eyes: Conjunctivae and EOM are normal. Pupils are equal, round, and reactive to light.  Neck: Normal range of motion. Neck supple.  Cardiovascular: Normal rate, regular rhythm and intact distal pulses.   No murmur  heard. Pulmonary/Chest: Effort normal and breath sounds normal. No respiratory distress. He has no wheezes. He has no rales.  Mild decreased breath sounds  Musculoskeletal: Normal range of motion. He exhibits no edema or tenderness.  Neurological: He is alert and oriented to person, place, and time.  Skin: Skin is warm and dry. No rash noted. No erythema.  Psychiatric: He has a normal mood and affect. His behavior is normal.  Nursing note and vitals reviewed.   ED Course  Procedures (including critical care time) Labs Review Labs Reviewed - No data to display  Imaging Review Dg Chest 2 View  03/19/2016  CLINICAL DATA:  Cough for 1 week  EXAM: CHEST  2 VIEW COMPARISON:  05/22/2014 FINDINGS: Cardiac shadow is within normal limits. Postsurgical changes in the cervical spine are seen. Some minimal scarring is noted in the right lung base stable from the previous exam. No focal infiltrate or sizable effusion is seen. No bony abnormality is noted. IMPRESSION: No active cardiopulmonary disease. Electronically Signed   By: Inez Catalina M.D.   On: 03/19/2016 13:38   I have personally reviewed and evaluated these images and lab results as part of my medical decision-making.   EKG Interpretation None      MDM   Final diagnoses:  Bronchitis    Pt with symptoms consistent with viral URI/bronchitis.  Well appearing here.  No signs of breathing difficulty  No signs of pharyngitis, otitis or abnormal abdominal findings.  Pt is a former smoker but no wheezing on exam and sats 98% on RA.  [facility] CXR wnl and pt to return with any further problems.  Treated with albuterol/prednisone    Blanchie Dessert, MD 03/19/16 [phone]

## 2016-03-19 NOTE — ED Notes (Signed)
Cough and congestion for almost a week.

## 2016-03-24 ENCOUNTER — Other Ambulatory Visit: Payer: Self-pay | Admitting: Family Medicine

## 2016-04-15 DIAGNOSIS — H35361 Drusen (degenerative) of macula, right eye: Secondary | ICD-10-CM | POA: Diagnosis not present

## 2016-04-15 DIAGNOSIS — H35372 Puckering of macula, left eye: Secondary | ICD-10-CM | POA: Diagnosis not present

## 2016-04-15 DIAGNOSIS — H353221 Exudative age-related macular degeneration, left eye, with active choroidal neovascularization: Secondary | ICD-10-CM | POA: Diagnosis not present

## 2016-04-15 DIAGNOSIS — Z87891 Personal history of nicotine dependence: Secondary | ICD-10-CM | POA: Diagnosis not present

## 2016-04-15 DIAGNOSIS — Z9889 Other specified postprocedural states: Secondary | ICD-10-CM | POA: Diagnosis not present

## 2016-04-15 DIAGNOSIS — H2513 Age-related nuclear cataract, bilateral: Secondary | ICD-10-CM | POA: Diagnosis not present

## 2016-04-15 DIAGNOSIS — H401124 Primary open-angle glaucoma, left eye, indeterminate stage: Secondary | ICD-10-CM | POA: Diagnosis not present

## 2016-04-15 DIAGNOSIS — H353110 Nonexudative age-related macular degeneration, right eye, stage unspecified: Secondary | ICD-10-CM | POA: Diagnosis not present

## 2016-04-19 ENCOUNTER — Other Ambulatory Visit: Payer: Self-pay | Admitting: Family Medicine

## 2016-04-27 DIAGNOSIS — K5900 Constipation, unspecified: Secondary | ICD-10-CM | POA: Diagnosis not present

## 2016-04-28 DIAGNOSIS — M5136 Other intervertebral disc degeneration, lumbar region: Secondary | ICD-10-CM | POA: Diagnosis not present

## 2016-04-28 DIAGNOSIS — M5416 Radiculopathy, lumbar region: Secondary | ICD-10-CM | POA: Diagnosis not present

## 2016-04-28 DIAGNOSIS — M4726 Other spondylosis with radiculopathy, lumbar region: Secondary | ICD-10-CM | POA: Diagnosis not present

## 2016-04-28 DIAGNOSIS — M4806 Spinal stenosis, lumbar region: Secondary | ICD-10-CM | POA: Diagnosis not present

## 2016-05-11 ENCOUNTER — Ambulatory Visit: Payer: PRIVATE HEALTH INSURANCE | Admitting: Family Medicine

## 2016-05-19 ENCOUNTER — Other Ambulatory Visit: Payer: Self-pay | Admitting: Family Medicine

## 2016-05-19 ENCOUNTER — Encounter: Payer: Self-pay | Admitting: Family Medicine

## 2016-05-19 ENCOUNTER — Ambulatory Visit (INDEPENDENT_AMBULATORY_CARE_PROVIDER_SITE_OTHER): Payer: Medicare Other | Admitting: Family Medicine

## 2016-05-19 VITALS — BP 138/72 | HR 72 | Temp 97.9°F | Ht 64.0 in | Wt 162.2 lb

## 2016-05-19 DIAGNOSIS — I10 Essential (primary) hypertension: Secondary | ICD-10-CM

## 2016-05-19 DIAGNOSIS — M810 Age-related osteoporosis without current pathological fracture: Secondary | ICD-10-CM | POA: Diagnosis not present

## 2016-05-19 DIAGNOSIS — K219 Gastro-esophageal reflux disease without esophagitis: Secondary | ICD-10-CM | POA: Diagnosis not present

## 2016-05-19 DIAGNOSIS — I4891 Unspecified atrial fibrillation: Secondary | ICD-10-CM | POA: Diagnosis not present

## 2016-05-19 DIAGNOSIS — Z0000 Encounter for general adult medical examination without abnormal findings: Secondary | ICD-10-CM

## 2016-05-19 DIAGNOSIS — E782 Mixed hyperlipidemia: Secondary | ICD-10-CM

## 2016-05-19 DIAGNOSIS — B370 Candidal stomatitis: Secondary | ICD-10-CM

## 2016-05-19 DIAGNOSIS — K625 Hemorrhage of anus and rectum: Secondary | ICD-10-CM

## 2016-05-19 DIAGNOSIS — I739 Peripheral vascular disease, unspecified: Secondary | ICD-10-CM

## 2016-05-19 DIAGNOSIS — D649 Anemia, unspecified: Secondary | ICD-10-CM

## 2016-05-19 LAB — CBC
HCT: 38.6 % — ABNORMAL LOW (ref 39.0–52.0)
Hemoglobin: 13.2 g/dL (ref 13.0–17.0)
MCHC: 34.2 g/dL (ref 30.0–36.0)
MCV: 90.6 fl (ref 78.0–100.0)
Platelets: 189 10*3/uL (ref 150.0–400.0)
RBC: 4.26 Mil/uL (ref 4.22–5.81)
RDW: 13.2 % (ref 11.5–15.5)
WBC: 8.2 10*3/uL (ref 4.0–10.5)

## 2016-05-19 LAB — COMPREHENSIVE METABOLIC PANEL
ALT: 13 U/L (ref 0–53)
AST: 17 U/L (ref 0–37)
Albumin: 4.1 g/dL (ref 3.5–5.2)
Alkaline Phosphatase: 52 U/L (ref 39–117)
BUN: 16 mg/dL (ref 6–23)
CO2: 29 mEq/L (ref 19–32)
Calcium: 9.5 mg/dL (ref 8.4–10.5)
Chloride: 104 mEq/L (ref 96–112)
Creatinine, Ser: 0.85 mg/dL (ref 0.40–1.50)
GFR: 94.89 mL/min (ref >60.00)
Glucose, Bld: 82 mg/dL (ref 70–99)
Potassium: 4.7 mEq/L (ref 3.5–5.1)
Sodium: 137 mEq/L (ref 135–145)
Total Bilirubin: 0.5 mg/dL (ref 0.2–1.2)
Total Protein: 6.8 g/dL (ref 6.0–8.3)

## 2016-05-19 LAB — LIPID PANEL
Cholesterol: 185 mg/dL (ref 0–200)
HDL: 54.1 mg/dL (ref >39.00)
LDL Cholesterol: 118 mg/dL — ABNORMAL HIGH (ref 0–99)
NonHDL: 130.78
Total CHOL/HDL Ratio: 3
Triglycerides: 63 mg/dL (ref 0.0–149.0)
VLDL: 12.6 mg/dL (ref 0.0–40.0)

## 2016-05-19 LAB — TSH: TSH: 1.41 u[IU]/mL (ref 0.35–4.50)

## 2016-05-19 MED ORDER — FLUCONAZOLE 100 MG PO TABS: 100.0000 mg | ORAL_TABLET | ORAL | 5 refills | Status: DC

## 2016-05-19 NOTE — Patient Instructions (Addendum)
Call insurance and ask if they will pay for a bone density test for hi risk medicine with hi risk of developing osteoporosis, ie PPI.  Document [employer] [facility]  Calcium 1200 to 1500 mg divided 3 x a day, each serving is about 500 mg Vitamin D 2000 IU caps 1 cap daily Send [place] copy of ACP documents   [employer] 10 strains probiotic 1 cap daily, [facility]   Preventive Care for Adults, Male A healthy lifestyle and preventive care can promote health and wellness. Preventive health guidelines for men include the following key practices:  A routine yearly physical is a good way to check with your health care provider about your health and preventative screening. It is a chance to share any concerns and updates on your health and to receive a thorough exam.  Visit your dentist for a routine exam and preventative care every 6 months. Brush your teeth twice a day and floss once a day. Good oral hygiene prevents tooth decay and gum disease.  The frequency of eye exams is based on your age, health, family medical history, use of contact lenses, and other factors. Follow your health care provider's recommendations for frequency of eye exams.  Eat a healthy diet. Foods such as vegetables, fruits, whole grains, low-fat dairy products, and lean protein foods contain the nutrients you need without too many calories. Decrease your intake of foods high in solid fats, added sugars, and salt. Eat the right amount of calories for you.Get information about a proper diet from your health care provider, if necessary.  Regular physical exercise is one of the most important things you can do for your health. Most adults should get at least 150 minutes of moderate-intensity exercise (any activity that increases your heart rate and causes you to sweat) each week. In addition, most adults need muscle-strengthening exercises on 2 or more days a week.  Maintain a healthy weight. The body mass index (BMI)  is a screening tool to identify possible weight problems. It provides an estimate of body fat based on height and weight. Your health care provider can find your BMI and can help you achieve or maintain a healthy weight.For adults 20 years and older:  A BMI below 18.5 is considered underweight.  A BMI of 18.5 to 24.9 is normal.  A BMI of 25 to 29.9 is considered overweight.  A BMI of 30 and above is considered obese.  Maintain normal blood lipids and cholesterol levels by exercising and minimizing your intake of saturated fat. Eat a balanced diet with plenty of fruit and vegetables. Blood tests for lipids and cholesterol should begin at age 19 and be repeated every 5 years. If your lipid or cholesterol levels are high, you are over 50, or you are at high risk for heart disease, you may need your cholesterol levels checked more frequently.Ongoing high lipid and cholesterol levels should be treated with medicines if diet and exercise are not working.  If you smoke, find out from your health care provider how to quit. If you do not use tobacco, do not start.  Lung cancer screening is recommended for adults aged 55-80 years who are at high risk for developing lung cancer because of a history of smoking. A yearly low-dose CT scan of the lungs is recommended for people who have at least a 30-pack-year history of smoking and are a current smoker or have quit within the past 15 years. A pack year of smoking is smoking  an average of 1 pack of cigarettes a day for 1 year (for example: 1 pack a day for 30 years or 2 packs a day for 15 years). Yearly screening should continue until the smoker has stopped smoking for at least 15 years. Yearly screening should be stopped for people who develop a health problem that would prevent them from having lung cancer treatment.  If you choose to drink alcohol, do not have more than 2 drinks per day. One drink is considered to be 12 ounces (355 mL) of beer, 5 ounces (148  mL) of wine, or 1.5 ounces (44 mL) of liquor.  Avoid use of street drugs. Do not share needles with anyone. Ask for help if you need support or instructions about stopping the use of drugs.  High blood pressure causes heart disease and increases the risk of stroke. Your blood pressure should be checked at least every 1-2 years. Ongoing high blood pressure should be treated with medicines, if weight loss and exercise are not effective.  If you are 29-24 years old, ask your health care provider if you should take aspirin to prevent heart disease.  Diabetes screening is done by taking a blood sample to check your blood glucose level after you have not eaten for a certain period of time (fasting). If you are not overweight and you do not have risk factors for diabetes, you should be screened once every 3 years starting at age 5. If you are overweight or obese and you are 16-52 years of age, you should be screened for diabetes every year as part of your cardiovascular risk assessment.  Colorectal cancer can be detected and often prevented. Most routine colorectal cancer screening begins at the age of 82 and continues through age 36. However, your health care provider may recommend screening at an earlier age if you have risk factors for colon cancer. On a yearly basis, your health care provider may provide home test kits to check for hidden blood in the stool. Use of a small camera at the end of a tube to directly examine the colon (sigmoidoscopy or colonoscopy) can detect the earliest forms of colorectal cancer. Talk to your health care provider about this at age 42, when routine screening begins. Direct exam of the colon should be repeated every 5-10 years through age 36, unless early forms of precancerous polyps or small growths are found.  People who are at an increased risk for hepatitis B should be screened for this virus. You are considered at high risk for hepatitis B if:  You were born in a  country where hepatitis B occurs often. Talk with your health care provider about which countries are considered high risk.  Your parents were born in a high-risk country and you have not received a shot to protect against hepatitis B (hepatitis B vaccine).  You have HIV or AIDS.  You use needles to inject street drugs.  You live with, or have sex with, someone who has hepatitis B.  You are a man who has sex with other men (MSM).  You get hemodialysis treatment.  You take certain medicines for conditions such as cancer, organ transplantation, and autoimmune conditions.  Hepatitis C blood testing is recommended for all people born from [birth year] through 1965 and any individual with known risks for hepatitis C.  Practice safe sex. Use condoms and avoid high-risk sexual practices to reduce the spread of sexually transmitted infections (STIs). STIs include gonorrhea, chlamydia, syphilis, trichomonas, herpes, HPV, and  human immunodeficiency virus (HIV). Herpes, HIV, and HPV are viral illnesses that have no cure. They can result in disability, cancer, and death.  If you are a man who has sex with other men, you should be screened at least once per year for:  HIV.  Urethral, rectal, and pharyngeal infection of gonorrhea, chlamydia, or both.  If you are at risk of being infected with HIV, it is recommended that you take a prescription medicine daily to prevent HIV infection. This is called preexposure prophylaxis (PrEP). You are considered at risk if:  You are a man who has sex with other men (MSM) and have other risk factors.  You are a heterosexual man, are sexually active, and are at increased risk for HIV infection.  You take drugs by injection.  You are sexually active with a partner who has HIV.  Talk with your health care provider about whether you are at high risk of being infected with HIV. If you choose to begin PrEP, you should first be tested for HIV. You should then be tested  every 3 months for as long as you are taking PrEP.  A one-time screening for abdominal aortic aneurysm (AAA) and surgical repair of large AAAs by ultrasound are recommended for men ages 76 to 42 years who are current or former smokers.  Healthy men should no longer receive prostate-specific antigen (PSA) blood tests as part of routine cancer screening. Talk with your health care provider about prostate cancer screening.  Testicular cancer screening is not recommended for adult males who have no symptoms. Screening includes self-exam, a health care provider exam, and other screening tests. Consult with your health care provider about any symptoms you have or any concerns you have about testicular cancer.  Use sunscreen. Apply sunscreen liberally and repeatedly throughout the day. You should seek shade when your shadow is shorter than you. Protect yourself by wearing long sleeves, pants, a wide-brimmed hat, and sunglasses year round, whenever you are outdoors.  Once a month, do a whole-body skin exam, using a mirror to look at the skin on your back. Tell your health care provider about new moles, moles that have irregular borders, moles that are larger than a pencil eraser, or moles that have changed in shape or color.  Stay current with required vaccines (immunizations).  Influenza vaccine. All adults should be immunized every year.  Tetanus, diphtheria, and acellular pertussis (Td, Tdap) vaccine. An adult who has not previously received Tdap or who does not know his vaccine status should receive 1 dose of Tdap. This initial dose should be followed by tetanus and diphtheria toxoids (Td) booster doses every 10 years. Adults with an unknown or incomplete history of completing a 3-dose immunization series with Td-containing vaccines should begin or complete a primary immunization series including a Tdap dose. Adults should receive a Td booster every 10 years.  Varicella vaccine. An adult without  evidence of immunity to varicella should receive 2 doses or a second dose if he has previously received 1 dose.  Human papillomavirus (HPV) vaccine. Males aged 11-21 years who have not received the vaccine previously should receive the 3-dose series. Males aged 22-26 years may be immunized. Immunization is recommended through the age of 51 years for any male who has sex with males and did not get any or all doses earlier. Immunization is recommended for any person with an immunocompromised condition through the age of 71 years if he did not get any or all doses earlier. During  the 3-dose series, the second dose should be obtained 4-8 weeks after the first dose. The third dose should be obtained 24 weeks after the first dose and 16 weeks after the second dose.  Zoster vaccine. One dose is recommended for adults aged 17 years or older unless certain conditions are present.  Measles, mumps, and rubella (MMR) vaccine. Adults born before [birth year] generally are considered immune to measles and mumps. Adults born in [birth year] or later should have 1 or more doses of MMR vaccine unless there is a contraindication to the vaccine or there is laboratory evidence of immunity to each of the three diseases. A routine second dose of MMR vaccine should be obtained at least 28 days after the first dose for students attending postsecondary schools, health care workers, or international travelers. People who received inactivated measles vaccine or an unknown type of measles vaccine during 1963-1967 should receive 2 doses of MMR vaccine. People who received inactivated mumps vaccine or an unknown type of mumps vaccine before 1979 and are at high risk for mumps infection should consider immunization with 2 doses of MMR vaccine. Unvaccinated health care workers born before [birth year] who lack laboratory evidence of measles, mumps, or rubella immunity or laboratory confirmation of disease should consider measles and mumps immunization with 2 doses  of MMR vaccine or rubella immunization with 1 dose of MMR vaccine.  Pneumococcal 13-valent conjugate (PCV13) vaccine. When indicated, a person who is uncertain of his immunization history and has no record of immunization should receive the PCV13 vaccine. All adults 45 years of age and older should receive this vaccine. An adult aged 40 years or older who has certain medical conditions and has not been previously immunized should receive 1 dose of PCV13 vaccine. This PCV13 should be followed with a dose of pneumococcal polysaccharide (PPSV23) vaccine. Adults who are at high risk for pneumococcal disease should obtain the PPSV23 vaccine at least 8 weeks after the dose of PCV13 vaccine. Adults older than 69 years of age who have normal immune system function should obtain the PPSV23 vaccine dose at least 1 year after the dose of PCV13 vaccine.  Pneumococcal polysaccharide (PPSV23) vaccine. When PCV13 is also indicated, PCV13 should be obtained first. All adults aged 54 years and older should be immunized. An adult younger than age 71 years who has certain medical conditions should be immunized. Any person who resides in a nursing home or long-term care facility should be immunized. An adult smoker should be immunized. People with an immunocompromised condition and certain other conditions should receive both PCV13 and PPSV23 vaccines. People with human immunodeficiency virus (HIV) infection should be immunized as soon as possible after diagnosis. Immunization during chemotherapy or radiation therapy should be avoided. Routine use of PPSV23 vaccine is not recommended for American Indians, [place] Natives, or people younger than 65 years unless there are medical conditions that require PPSV23 vaccine. When indicated, people who have unknown immunization and have no record of immunization should receive PPSV23 vaccine. One-time revaccination 5 years after the first dose of PPSV23 is recommended for people aged 19-64  years who have chronic kidney failure, nephrotic syndrome, asplenia, or immunocompromised conditions. People who received 1-2 doses of PPSV23 before age 22 years should receive another dose of PPSV23 vaccine at age 9 years or later if at least 5 years have passed since the previous dose. Doses of PPSV23 are not needed for people immunized with PPSV23 at or after age 63 years.  Meningococcal vaccine. Adults with asplenia  or persistent complement component deficiencies should receive 2 doses of quadrivalent meningococcal conjugate (MenACWY-D) vaccine. The doses should be obtained at least 2 months apart. Microbiologists working with certain meningococcal bacteria, [place] recruits, people at risk during an outbreak, and people who travel to or live in countries with a high rate of meningitis should be immunized. A first-year college student up through age 5 years who is living in a residence hall should receive a dose if he did not receive a dose on or after his 16th birthday. Adults who have certain high-risk conditions should receive one or more doses of vaccine.  Hepatitis A vaccine. Adults who wish to be protected from this disease, have chronic liver disease, work with hepatitis A-infected animals, work in hepatitis A research labs, or travel to or work in countries with a high rate of hepatitis A should be immunized. Adults who were previously unvaccinated and who anticipate close contact with an international adoptee during the first 60 days after arrival in the [place] from a country with a high rate of hepatitis A should be immunized.  Hepatitis B vaccine. Adults should be immunized if they wish to be protected from this disease, are under age 75 years and have diabetes, have chronic liver disease, have had more than one sex partner in the past 6 months, may be exposed to blood or other infectious body fluids, are household contacts or sex partners of hepatitis B positive people, are clients or  workers in certain care facilities, or travel to or work in countries with a high rate of hepatitis B.  Haemophilus influenzae type b (Hib) vaccine. A previously unvaccinated person with asplenia or sickle cell disease or having a scheduled splenectomy should receive 1 dose of Hib vaccine. Regardless of previous immunization, a recipient of a hematopoietic stem cell transplant should receive a 3-dose series 6-12 months after his successful transplant. Hib vaccine is not recommended for adults with HIV infection. Preventive Service / Frequency Ages 41 to 45  Blood pressure check.** / Every 3-5 years.  Lipid and cholesterol check.** / Every 5 years beginning at age 15.  Hepatitis C blood test.** / For any individual with known risks for hepatitis C.  Skin self-exam. / Monthly.  Influenza vaccine. / Every year.  Tetanus, diphtheria, and acellular pertussis (Tdap, Td) vaccine.** / Consult your health care provider. 1 dose of Td every 10 years.  Varicella vaccine.** / Consult your health care provider.  HPV vaccine. / 3 doses over 6 months, if 47 or younger.  Measles, mumps, rubella (MMR) vaccine.** / You need at least 1 dose of MMR if you were born in 1957 or later. You may also need a second dose.  Pneumococcal 13-valent conjugate (PCV13) vaccine.** / Consult your health care provider.  Pneumococcal polysaccharide (PPSV23) vaccine.** / 1 to 2 doses if you smoke cigarettes or if you have certain conditions.  Meningococcal vaccine.** / 1 dose if you are age 41 to 33 years and a Market researcher living in a residence hall, or have one of several medical conditions. You may also need additional booster doses.  Hepatitis A vaccine.** / Consult your health care provider.  Hepatitis B vaccine.** / Consult your health care provider.  Haemophilus influenzae type b (Hib) vaccine.** / Consult your health care provider. Ages 67 to 72  Blood pressure check.** / Every year.  Lipid and  cholesterol check.** / Every 5 years beginning at age 37.  Lung cancer screening. / Every year if you  are aged 39-80 years and have a 30-pack-year history of smoking and currently smoke or have quit within the past 15 years. Yearly screening is stopped once you have quit smoking for at least 15 years or develop a health problem that would prevent you from having lung cancer treatment.  Fecal occult blood test (FOBT) of stool. / Every year beginning at age 106 and continuing until age 87. You may not have to do this test if you get a colonoscopy every 10 years.  Flexible sigmoidoscopy** or colonoscopy.** / Every 5 years for a flexible sigmoidoscopy or every 10 years for a colonoscopy beginning at age 13 and continuing until age 30.  Hepatitis C blood test.** / For all people born from [birth year] through 1964 and any individual with known risks for hepatitis C.  Skin self-exam. / Monthly.  Influenza vaccine. / Every year.  Tetanus, diphtheria, and acellular pertussis (Tdap/Td) vaccine.** / Consult your health care provider. 1 dose of Td every 10 years.  Varicella vaccine.** / Consult your health care provider.  Zoster vaccine.** / 1 dose for adults aged 27 years or older.  Measles, mumps, rubella (MMR) vaccine.** / You need at least 1 dose of MMR if you were born in 1956 or later. You may also need a second dose.  Pneumococcal 13-valent conjugate (PCV13) vaccine.** / Consult your health care provider.  Pneumococcal polysaccharide (PPSV23) vaccine.** / 1 to 2 doses if you smoke cigarettes or if you have certain conditions.  Meningococcal vaccine.** / Consult your health care provider.  Hepatitis A vaccine.** / Consult your health care provider.  Hepatitis B vaccine.** / Consult your health care provider.  Haemophilus influenzae type b (Hib) vaccine.** / Consult your health care provider. Ages 78 and over  Blood pressure check.** / Every year.  Lipid and cholesterol check.**/ Every 5 years  beginning at age 14.  Lung cancer screening. / Every year if you are aged 56-80 years and have a 30-pack-year history of smoking and currently smoke or have quit within the past 15 years. Yearly screening is stopped once you have quit smoking for at least 15 years or develop a health problem that would prevent you from having lung cancer treatment.  Fecal occult blood test (FOBT) of stool. / Every year beginning at age 11 and continuing until age 29. You may not have to do this test if you get a colonoscopy every 10 years.  Flexible sigmoidoscopy** or colonoscopy.** / Every 5 years for a flexible sigmoidoscopy or every 10 years for a colonoscopy beginning at age 37 and continuing until age 40.  Hepatitis C blood test.** / For all people born from [birth year] through 1964 and any individual with known risks for hepatitis C.  Abdominal aortic aneurysm (AAA) screening.** / A one-time screening for ages 79 to 19 years who are current or former smokers.  Skin self-exam. / Monthly.  Influenza vaccine. / Every year.  Tetanus, diphtheria, and acellular pertussis (Tdap/Td) vaccine.** / 1 dose of Td every 10 years.  Varicella vaccine.** / Consult your health care provider.  Zoster vaccine.** / 1 dose for adults aged 18 years or older.  Pneumococcal 13-valent conjugate (PCV13) vaccine.** / 1 dose for all adults aged 68 years and older.  Pneumococcal polysaccharide (PPSV23) vaccine.** / 1 dose for all adults aged 68 years and older.  Meningococcal vaccine.** / Consult your health care provider.  Hepatitis A vaccine.** / Consult your health care provider.  Hepatitis B vaccine.** / Consult your health care provider.  Haemophilus influenzae type b (Hib) vaccine.** / Consult your health care provider. **Family history and personal history of risk and conditions may change your health care provider's recommendations.   This information is not intended to replace advice given to you by your health care  provider. Make sure you discuss any questions you have with your health care provider.   Document Released: 11/17/2001 Document Revised: 10/12/2014 Document Reviewed: 02/16/2011 Elsevier Interactive Patient Education [employer].

## 2016-05-20 ENCOUNTER — Telehealth: Payer: Self-pay | Admitting: Internal Medicine

## 2016-05-20 LAB — HEPATITIS C ANTIBODY: HCV Ab: NEGATIVE

## 2016-05-20 NOTE — Telephone Encounter (Signed)
F/u ° ° ° ° ° °Pt returning nurse call.  °

## 2016-05-20 NOTE — Telephone Encounter (Signed)
Left message to return my call.  

## 2016-05-20 NOTE — Telephone Encounter (Addendum)
Spoke with patient about 2 weeks ago he had a mild afib attack and took Propanolol and his wife elevated his feet. Afib subsided. He saw Dr Randel Pigg yesterday who wanted to have him seen again by [name].  I have scheduled him to see [name] this [place]arks at 10:30

## 2016-05-20 NOTE — Telephone Encounter (Signed)
New message   Pt verbalized that he wants to speak to Dr.Taylor pt feels fine today it happened 2 weeks ago pt was in A-Fib    Patient c/o Palpitations:  High priority if patient c/o lightheadedness and shortness of breath.  1. How long have you been having palpitations? It happened for only one day  05/12/16  2. Are you currently experiencing lightheadedness and shortness of breath? Light headed and dizziness  3. Have you checked your BP and heart rate? (document readings) no  4. Are you experiencing any other symptoms? no

## 2016-05-21 DIAGNOSIS — Z9889 Other specified postprocedural states: Secondary | ICD-10-CM | POA: Diagnosis not present

## 2016-05-21 DIAGNOSIS — H21301 Idiopathic cysts of iris, ciliary body or anterior chamber, right eye: Secondary | ICD-10-CM | POA: Diagnosis not present

## 2016-05-21 DIAGNOSIS — Z87891 Personal history of nicotine dependence: Secondary | ICD-10-CM | POA: Diagnosis not present

## 2016-05-21 DIAGNOSIS — H401124 Primary open-angle glaucoma, left eye, indeterminate stage: Secondary | ICD-10-CM | POA: Diagnosis not present

## 2016-05-21 DIAGNOSIS — H2513 Age-related nuclear cataract, bilateral: Secondary | ICD-10-CM | POA: Diagnosis not present

## 2016-05-22 ENCOUNTER — Ambulatory Visit (INDEPENDENT_AMBULATORY_CARE_PROVIDER_SITE_OTHER): Payer: Medicare Other | Admitting: Internal Medicine

## 2016-05-22 ENCOUNTER — Other Ambulatory Visit: Payer: Self-pay | Admitting: Internal Medicine

## 2016-05-22 VITALS — BP 126/82 | HR 60 | Wt 161.6 lb

## 2016-05-22 DIAGNOSIS — I480 Paroxysmal atrial fibrillation: Secondary | ICD-10-CM | POA: Diagnosis not present

## 2016-05-22 MED ORDER — PROPRANOLOL HCL 10 MG PO TABS: 10.0000 mg | ORAL_TABLET | ORAL | 0 refills | Status: DC | PRN

## 2016-05-22 NOTE — Progress Notes (Signed)
HPI The patient is a 70 yo man with a h/o syncope a couple of years ago who was offered a loop recorder and refused. He carries a h/o atrial fib although I cannot find much documentation of this. There is also a remote h/o flutter. He had an episode of palpitations which resolved spontaneously. He remains on chronic anti-coagulation.  Allergies  Allergen Reactions  . Polymyxin B-Trimethoprim Swelling    Eye drops made eyes swell  . Codeine Rash and Itching  . Guaifenesin & Derivatives     Other reaction(s): Hallucinations  . Statins Other (See Comments)    Muscle aches   . Atorvastatin Other (See Comments)    Muscle aches and cramps  . Avelox [Moxifloxacin] Other (See Comments)    Stomach cramps  . Guaifenesin Diarrhea  . Meloxicam Other (See Comments)    unknown  . Mucinex D [Pseudoephedrine-Guaifenesin Er] Nausea And Vomiting    Stomach cramps  . Polymyxin B Other (See Comments)    Swelling  . Pseudoephedrine     Stomach cramps  . Pseudoephedrine-Guaifenesin Nausea And Vomiting    Stomach cramps  . Rosuvastatin Calcium Other (See Comments)    Muscle aches  . Simvastatin Other (See Comments)    Muscle aches and cramps  . Tapentadol Other (See Comments)    unknown  . Vioxx [Rofecoxib] Other (See Comments)    Stomach cramping   . Ciprofloxacin Itching, Rash and Nausea Only  . Codeine Phosphate Itching and Rash     Current Outpatient Prescriptions  Medication Sig Dispense Refill  . clobetasol (TEMOVATE) 0.05 % external solution APPLY 1 APPLICATION TOPICALLY TWICE DAILY 50 mL 0  . ELIQUIS 5 MG TABS tablet Take 1 tablet by mouth two  times daily 180 tablet 0  . fluconazole (DIFLUCAN) 100 MG tablet Take 1 tablet (100 mg total) by mouth once a week. Take one by mouth 4 tablet 5  . latanoprost (XALATAN) 0.005 % ophthalmic solution Place 1 drop into the left eye at bedtime.     . lidocaine (XYLOCAINE) 2 % solution SHAKE LIQUID WELL THEN TAKE 5 MLS BY MOUTH FOUR TIMES  DAILY AS NEEDED FOR MOUTH PAIN 100 mL 0  . lubiprostone (AMITIZA) 8 MCG capsule Take 1-2 tablets by mouth a day    . magic mouthwash w/lidocaine SOLN Take 5 mLs by mouth 4 (four) times daily as needed for mouth pain. 5 mL 1  . Multiple Vitamins-Minerals (ICAPS MV) TABS Take 1 tablet by mouth 2 (two) times daily.     Marland Kitchen omeprazole (PRILOSEC OTC) 20 MG tablet Take 20 mg by mouth daily.    . predniSONE (DELTASONE) 20 MG tablet Take 2 tablets (40 mg total) by mouth daily. 10 tablet 0  . propranolol (INDERAL) 10 MG tablet Take 10 mg by mouth as needed. As directed by MD    . Ranibizumab (LUCENTIS IO) Inject into the eye as directed. (Macular Degeneration)    . ZETIA 10 MG tablet Take 1 tablet by mouth  daily with supper 90 tablet 1   No current facility-administered medications for this visit.      Past Medical History:  Diagnosis Date  . Anemia   . Atrial flutter ([place]) 03/31/2012   converted spontaneously to sinus  . CAD (coronary artery disease)    reportedly moderate CAD; managed medically  . COPD (chronic obstructive pulmonary disease) ([place])   . Eczema 08/03/2014  . GERD (gastroesophageal reflux disease)   . H/O: rheumatic  fever   . Heart murmur   . Heart murmur   . High risk medication use    on amiodarone since 03/31/2012  . History of colonoscopy   . Hyperlipidemia   . Hypertension   . Kidney stones   . Macular degeneration     ROS:   All systems reviewed and negative except as noted in the HPI.   Past Surgical History:  Procedure Laterality Date  . APPENDECTOMY    . CARDIAC CATHETERIZATION  04/22/2011   moderate left main and RCA stenosis not significant by FFR and IVUS on medical therapy  . CERVICAL DISCECTOMY     C5,6,7 disc fused  with plate and 5 1" screws  . CHOLECYSTECTOMY    . FOOT SURGERY     right calcification removed from top of foot  . knee cartiledge Right    right knee  . MOUTH SURGERY     periodontal surgery, bridges, splint in front,       Family History  Problem Relation Age of Onset  . Heart disease Mother   . Diabetes Mother   . Cirrhosis Mother   . Emphysema Mother     never smoked but 2nd hand through her spouse  . Hypertension Mother   . Macular degeneration Mother   . Heart disease Father   . Cancer Father     prostate  . Hyperlipidemia Father   . Hypertension Father   . Varicose Veins Father   . Heart attack Father   . Peripheral vascular disease Father   . Heart disease Sister   . Arthritis Sister   . Hyperlipidemia Sister   . Obesity Sister   . Macular degeneration Sister   . Heart disease Brother     5 stents  . Hyperlipidemia Brother   . Macular degeneration Maternal Grandfather   . Cirrhosis Sister   . Obesity Sister   . Arthritis Sister   . Heart disease Sister   . Obesity Sister   . Liver disease    . Prostate cancer    . Coronary artery disease       Social History   Social History  . Marital status: Married    Spouse name: N/A  . Number of children: 0  . Years of education: N/A   Occupational History  . retired Retired   Social History Main Topics  . Smoking status: Former Smoker    Packs/day: 0.50    Years: 50.00    Types: Cigarettes    Quit date: 05/01/2013  . Smokeless tobacco: Former Systems developer     Comment: using e-Cig//ldc  . Alcohol use No  . Drug use: No  . Sexual activity: Yes     Comment: lives with wife, no dietary restrictions   Other Topics Concern  . Not on file   Social History Narrative  . No narrative on file     BP 126/82   Pulse 60   Wt 161 lb 9.6 oz (73.3 kg)   BMI 27.74 kg/m   Physical Exam:  Well appearing NAD HEENT: Unremarkable Neck:  No JVD, no thyromegally Lymphatics:  No adenopathy Back:  No CVA tenderness Lungs:  Clear HEART:  Regular rate rhythm, no murmurs, no rubs, no clicks Abd:  soft, positive bowel sounds, no organomegally, no rebound, no guarding Ext:  2 plus pulses, no edema, no cyanosis, no clubbing Skin:  No  rashes no nodules Neuro:  CN II through XII intact, motor grossly intact  EKG -  nsr   Assess/Plan: 1. Atrial fib - he is in NSR. His symptoms are controlled. We discussed the possibility of switching meds but because he has not been very symptomatic, I would suggest using the propranolol as needed.  2. HTN - his blood pressure is well controlled.  3. Dyslipidemia - he will continue Zetia.  Mikle Bosworth.D.

## 2016-05-22 NOTE — Patient Instructions (Signed)

## 2016-05-24 ENCOUNTER — Encounter: Payer: Self-pay | Admitting: Family Medicine

## 2016-05-25 ENCOUNTER — Other Ambulatory Visit: Payer: Self-pay | Admitting: Family Medicine

## 2016-05-25 DIAGNOSIS — Z79899 Other long term (current) drug therapy: Secondary | ICD-10-CM

## 2016-05-25 DIAGNOSIS — Z92241 Personal history of systemic steroid therapy: Secondary | ICD-10-CM

## 2016-05-25 DIAGNOSIS — T380X5S Adverse effect of glucocorticoids and synthetic analogues, sequela: Secondary | ICD-10-CM

## 2016-05-26 ENCOUNTER — Telehealth: Payer: Self-pay | Admitting: Family Medicine

## 2016-05-26 NOTE — Telephone Encounter (Signed)
Pt's spouse Thayer Headings) dropped off [place] for PCP to see and have scanned on pt's chart. Document put at front office tray.

## 2016-05-27 ENCOUNTER — Ambulatory Visit (HOSPITAL_BASED_OUTPATIENT_CLINIC_OR_DEPARTMENT_OTHER)
Admission: RE | Admit: 2016-05-27 | Discharge: 2016-05-27 | Disposition: A | Discharge status: Home / Self Care | Payer: Medicare Other | Source: Ambulatory Visit | Attending: Family Medicine | Admitting: Family Medicine

## 2016-05-27 ENCOUNTER — Encounter: Payer: Self-pay | Admitting: Family Medicine

## 2016-05-27 DIAGNOSIS — Z87891 Personal history of nicotine dependence: Secondary | ICD-10-CM | POA: Insufficient documentation

## 2016-05-27 DIAGNOSIS — T380X5S Adverse effect of glucocorticoids and synthetic analogues, sequela: Secondary | ICD-10-CM | POA: Insufficient documentation

## 2016-05-27 DIAGNOSIS — M810 Age-related osteoporosis without current pathological fracture: Secondary | ICD-10-CM | POA: Diagnosis not present

## 2016-05-27 DIAGNOSIS — Z92241 Personal history of systemic steroid therapy: Secondary | ICD-10-CM | POA: Diagnosis not present

## 2016-05-27 DIAGNOSIS — Z79899 Other long term (current) drug therapy: Secondary | ICD-10-CM | POA: Insufficient documentation

## 2016-05-28 ENCOUNTER — Other Ambulatory Visit: Payer: Self-pay | Admitting: Family Medicine

## 2016-05-28 ENCOUNTER — Other Ambulatory Visit (INDEPENDENT_AMBULATORY_CARE_PROVIDER_SITE_OTHER): Payer: Medicare Other

## 2016-05-28 DIAGNOSIS — M810 Age-related osteoporosis without current pathological fracture: Secondary | ICD-10-CM

## 2016-05-28 MED ORDER — RANITIDINE HCL 300 MG PO TABS: 300.0000 mg | ORAL_TABLET | Freq: Every day | ORAL | 3 refills | Status: DC

## 2016-05-28 MED ORDER — ALENDRONATE SODIUM 70 MG PO TABS: 70.0000 mg | ORAL_TABLET | ORAL | 5 refills | Status: DC

## 2016-05-29 LAB — VITAMIN D 25 HYDROXY (VIT D DEFICIENCY, FRACTURES): VITD: 34.64 ng/mL (ref 30.00–100.00)

## 2016-05-31 ENCOUNTER — Encounter: Payer: Self-pay | Admitting: Family Medicine

## 2016-05-31 DIAGNOSIS — M810 Age-related osteoporosis without current pathological fracture: Secondary | ICD-10-CM

## 2016-05-31 HISTORY — DX: Age-related osteoporosis without current pathological fracture: M81.0

## 2016-05-31 NOTE — Assessment & Plan Note (Signed)
Patient denies any difficulties at home. No trouble with ADLs, depression or falls. See EMR for functional status screen and depression screen. No recent changes to vision or hearing. Is UTD with immunizations. Is UTD with screening. Discussed Advanced Directives. Encouraged heart healthy diet, exercise as tolerated and adequate sleep. See patient's problem list for health risk factors to monitor. See AVS for preventative healthcare recommendation schedule. Given and reviewed copy of ACP documents from [employer] and encouraged to complete and return. Labs reviewed

## 2016-05-31 NOTE — Assessment & Plan Note (Signed)
Avoid offending foods, take probiotics. Do not eat large meals in late evening and consider raising head of bed. Has used PPIs for years.

## 2016-05-31 NOTE — Assessment & Plan Note (Signed)
Well controlled, no changes to meds. Encouraged heart healthy diet such as the DASH diet and exercise as tolerated.  °

## 2016-05-31 NOTE — Progress Notes (Signed)
Patient ID: Chayden Garrelts, male   DOB: [birth year], 70 y.o.   MRN: [id number]   Subjective:    Patient ID: Zacchaeus Halm, male    DOB: 1946-04-26, 70 y.o.   MRN: [id number]  Chief Complaint  Patient presents with  . Medicare Wellness    HPI Patient is in today for medicare wellness exam and follow up on chronic medical conditions. He struggles with osteoarthritis, reflux, hypertension, hyperlipidemia, CADk constipation, sleep apnea, afib and copd amongst others. He feels well today but has been told by his specialty physician to get a bone density test and that we could order it. He has had long term use of PPIs to manage hs reflux. This is well controlled at this time. No falls, acute illness or hospitalizations. They have switched him from PPI to Ranitidine. No falls or depression. Denies CP/palp/SOB/HA/congestion/fevers/GI or GU c/o. Taking meds as prescribed  Past Medical History:  Diagnosis Date  . Anemia   . Atrial flutter ([place]) 03/31/2012   converted spontaneously to sinus  . CAD (coronary artery disease)    reportedly moderate CAD; managed medically  . COPD (chronic obstructive pulmonary disease) ([place])   . Eczema 08/03/2014  . GERD (gastroesophageal reflux disease)   . H/O: rheumatic fever   . Heart murmur   . Heart murmur   . High risk medication use    on amiodarone since 03/31/2012  . History of colonoscopy   . Hyperlipidemia   . Hypertension   . Kidney stones   . Macular degeneration   . Osteoporosis 05/31/2016    Past Surgical History:  Procedure Laterality Date  . APPENDECTOMY    . CARDIAC CATHETERIZATION  04/22/2011   moderate left main and RCA stenosis not significant by FFR and IVUS on medical therapy  . CERVICAL DISCECTOMY     C5,6,7 disc fused  with plate and 5 1" screws  . CHOLECYSTECTOMY    . FOOT SURGERY     right calcification removed from top of foot  . knee cartiledge Right    right knee  . MOUTH SURGERY     periodontal surgery, bridges,  splint in front,     Family History  Problem Relation Age of Onset  . Heart disease Mother   . Diabetes Mother   . Cirrhosis Mother   . Emphysema Mother     never smoked but 2nd hand through her spouse  . Hypertension Mother   . Macular degeneration Mother   . Heart disease Father   . Cancer Father     prostate  . Hyperlipidemia Father   . Hypertension Father   . Varicose Veins Father   . Heart attack Father   . Peripheral vascular disease Father   . Heart disease Sister   . Arthritis Sister   . Hyperlipidemia Sister   . Obesity Sister   . Macular degeneration Sister   . Heart disease Brother     5 stents  . Hyperlipidemia Brother   . Macular degeneration Maternal Grandfather   . Cirrhosis Sister   . Obesity Sister   . Arthritis Sister   . Heart disease Sister   . Obesity Sister   . Liver disease    . Prostate cancer    . Coronary artery disease      Social History   Social History  . Marital status: Married    Spouse name: N/A  . Number of children: 0  . Years of education: N/A   Occupational History  .  retired Retired   Social History Main Topics  . Smoking status: Former Smoker    Packs/day: 0.50    Years: 50.00    Types: Cigarettes    Quit date: 05/01/2013  . Smokeless tobacco: Former Systems developer     Comment: using e-Cig//ldc  . Alcohol use No  . Drug use: No  . Sexual activity: Yes     Comment: lives with wife, no dietary restrictions   Other Topics Concern  . Not on file   Social History Narrative  . No narrative on file    Outpatient Medications Prior to Visit  Medication Sig Dispense Refill  . clobetasol (TEMOVATE) 0.05 % external solution APPLY 1 APPLICATION TOPICALLY TWICE DAILY 50 mL 0  . ELIQUIS 5 MG TABS tablet Take 1 tablet by mouth two  times daily 180 tablet 0  . latanoprost (XALATAN) 0.005 % ophthalmic solution Place 1 drop into the left eye at bedtime.     . lidocaine (XYLOCAINE) 2 % solution SHAKE LIQUID WELL THEN TAKE 5 MLS BY  MOUTH FOUR TIMES DAILY AS NEEDED FOR MOUTH PAIN 100 mL 0  . lubiprostone (AMITIZA) 8 MCG capsule Take 1-2 tablets by mouth a day    . magic mouthwash w/lidocaine SOLN Take 5 mLs by mouth 4 (four) times daily as needed for mouth pain. 5 mL 1  . Multiple Vitamins-Minerals (ICAPS MV) TABS Take 1 tablet by mouth 2 (two) times daily.     Marland Kitchen omeprazole (PRILOSEC OTC) 20 MG tablet Take 20 mg by mouth daily.    . predniSONE (DELTASONE) 20 MG tablet Take 2 tablets (40 mg total) by mouth daily. 10 tablet 0  . Ranibizumab (LUCENTIS IO) Inject into the eye as directed. (Macular Degeneration)    . ZETIA 10 MG tablet Take 1 tablet by mouth  daily with supper 90 tablet 1  . fluconazole (DIFLUCAN) 100 MG tablet Take one by mouth 1 tablet 5   No facility-administered medications prior to visit.     Allergies  Allergen Reactions  . Polymyxin B-Trimethoprim Swelling    Eye drops made eyes swell  . Codeine Rash and Itching  . Guaifenesin & Derivatives     Other reaction(s): Hallucinations  . Statins Other (See Comments)    Muscle aches   . Atorvastatin Other (See Comments)    Muscle aches and cramps  . Avelox [Moxifloxacin] Other (See Comments)    Stomach cramps  . Guaifenesin Diarrhea  . Meloxicam Other (See Comments)    unknown  . Mucinex D [Pseudoephedrine-Guaifenesin Er] Nausea And Vomiting    Stomach cramps  . Polymyxin B Other (See Comments)    Swelling  . Pseudoephedrine     Stomach cramps  . Pseudoephedrine-Guaifenesin Nausea And Vomiting    Stomach cramps  . Rosuvastatin Calcium Other (See Comments)    Muscle aches  . Simvastatin Other (See Comments)    Muscle aches and cramps  . Tapentadol Other (See Comments)    unknown  . Vioxx [Rofecoxib] Other (See Comments)    Stomach cramping   . Ciprofloxacin Itching, Rash and Nausea Only  . Codeine Phosphate Itching and Rash    Review of Systems  Constitutional: Negative for chills, fever and malaise/fatigue.  HENT: Negative for  congestion and hearing loss.   Eyes: Negative for discharge.  Respiratory: Negative for cough, sputum production and shortness of breath.   Cardiovascular: Negative for chest pain, palpitations and leg swelling.  Gastrointestinal: Negative for abdominal pain, blood in stool, constipation, diarrhea,  heartburn, nausea and vomiting.  Genitourinary: Negative for dysuria, frequency, hematuria and urgency.  Musculoskeletal: Negative for back pain, falls and myalgias.  Skin: Negative for rash.  Neurological: Negative for dizziness, sensory change, loss of consciousness, weakness and headaches.  Endo/Heme/Allergies: Negative for environmental allergies. Does not bruise/bleed easily.  Psychiatric/Behavioral: Negative for depression and suicidal ideas. The patient is not nervous/anxious and does not have insomnia.        Objective:    Physical Exam  Constitutional: He is oriented to person, place, and time. He appears well-developed and well-nourished. No distress.  HENT:  Head: Normocephalic and atraumatic.  Eyes: Conjunctivae are normal.  Neck: Neck supple. No thyromegaly present.  Cardiovascular: Normal rate, regular rhythm and normal heart sounds.   No murmur heard. Pulmonary/Chest: Effort normal and breath sounds normal. No respiratory distress. He has no wheezes.  Abdominal: Soft. Bowel sounds are normal. He exhibits no mass. There is no tenderness.  Musculoskeletal: He exhibits no edema.  Lymphadenopathy:    He has no cervical adenopathy.  Neurological: He is alert and oriented to person, place, and time.  Skin: Skin is warm and dry.  Psychiatric: He has a normal mood and affect. His behavior is normal.    BP 138/72 (BP Location: Left Arm, Patient Position: Sitting, Cuff Size: Normal)   Pulse 72   Temp 97.9 F (36.6 C) (Oral)   Ht '5\' 4"'$  (1.626 m)   Wt 162 lb 4 oz (73.6 kg)   SpO2 97%   BMI 27.85 kg/m  Wt Readings from Last 3 Encounters:  05/22/16 161 lb 9.6 oz (73.3 kg)    05/19/16 162 lb 4 oz (73.6 kg)  03/19/16 155 lb (70.3 kg)     Lab Results  Component Value Date   WBC 8.2 05/19/2016   HGB 13.2 05/19/2016   HCT 38.6 (L) 05/19/2016   PLT 189.0 05/19/2016   GLUCOSE 82 05/19/2016   CHOL 185 05/19/2016   TRIG 63.0 05/19/2016   HDL 54.10 05/19/2016   LDLDIRECT 134.2 12/01/2013   LDLCALC 118 (H) 05/19/2016   ALT 13 05/19/2016   AST 17 05/19/2016   NA 137 05/19/2016   K 4.7 05/19/2016   CL 104 05/19/2016   CREATININE 0.85 05/19/2016   BUN 16 05/19/2016   CO2 29 05/19/2016   TSH 1.41 05/19/2016   PSA 1.64 01/14/2015   INR 1.17 04/01/2012    Lab Results  Component Value Date   TSH 1.41 05/19/2016   Lab Results  Component Value Date   WBC 8.2 05/19/2016   HGB 13.2 05/19/2016   HCT 38.6 (L) 05/19/2016   MCV 90.6 05/19/2016   PLT 189.0 05/19/2016   Lab Results  Component Value Date   NA 137 05/19/2016   K 4.7 05/19/2016   CO2 29 05/19/2016   GLUCOSE 82 05/19/2016   BUN 16 05/19/2016   CREATININE 0.85 05/19/2016   BILITOT 0.5 05/19/2016   ALKPHOS 52 05/19/2016   AST 17 05/19/2016   ALT 13 05/19/2016   PROT 6.8 05/19/2016   ALBUMIN 4.1 05/19/2016   CALCIUM 9.5 05/19/2016   GFR 94.89 05/19/2016   Lab Results  Component Value Date   CHOL 185 05/19/2016   Lab Results  Component Value Date   HDL 54.10 05/19/2016   Lab Results  Component Value Date   LDLCALC 118 (H) 05/19/2016   Lab Results  Component Value Date   TRIG 63.0 05/19/2016   Lab Results  Component Value Date   CHOLHDL 3  05/19/2016   No results found for: HGBA1C     Assessment & Plan:   Problem List Items Addressed This Visit    Hyperlipidemia, mixed    Encouraged heart healthy diet, increase exercise, avoid trans fats, consider a krill oil cap daily      Relevant Orders   TSH (Completed)   Lipid panel (Completed)   Essential hypertension    Well controlled, no changes to meds. Encouraged heart healthy diet such as the DASH diet and exercise as  tolerated.       Relevant Medications   fluconazole (DIFLUCAN) 100 MG tablet   Other Relevant Orders   TSH (Completed)   CBC (Completed)   Comprehensive metabolic panel (Completed)   GERD    Avoid offending foods, take probiotics. Do not eat large meals in late evening and consider raising head of bed. Has used PPIs for years.      Relevant Medications   fluconazole (DIFLUCAN) 100 MG tablet   Other Relevant Orders   TSH (Completed)   PAD (peripheral artery disease) ([facility])   Relevant Orders   TSH (Completed)   Rectal bleeding   Relevant Medications   fluconazole (DIFLUCAN) 100 MG tablet   Medicare annual wellness visit, subsequent    Patient denies any difficulties at home. No trouble with ADLs, depression or falls. See EMR for functional status screen and depression screen. No recent changes to vision or hearing. Is UTD with immunizations. Is UTD with screening. Discussed Advanced Directives. Encouraged heart healthy diet, exercise as tolerated and adequate sleep. See patient's problem list for health risk factors to monitor. See AVS for preventative healthcare recommendation schedule. Given and reviewed copy of ACP documents from [employer] and encouraged to complete and return. Labs reviewed      Thrush   Relevant Medications   fluconazole (DIFLUCAN) 100 MG tablet   Osteoporosis    Diagnosed after patient requested bone density testing at suggestion of another physician due to long term use of PPIs. Insurance refused to pay for testing for this reason so test was ordered for steroid use. His vitamin D was tested and supplemented. Recommend calcium intake of 1200 to 1500 mg daily, divided into roughly 3 doses. Best source is the diet and a single dairy serving is about 500 mg, a supplement of calcium citrate once or twice daily to balance diet is fine if not getting enough in diet. Also need Vitamin D 2000 IU caps, 1 cap daily if not already taking vitamin D. Also  recommend weight baring exercise on hips and upper body to keep bones strong. Prescribed Fosamax weekly       Other Visit Diagnoses    Atrial fibrillation, unspecified type ([place])    -  Primary   Relevant Orders   Ambulatory referral to Cardiology   TSH (Completed)   Preventative health care       Relevant Orders   Hepatitis C Antibody (Completed)      I have changed Mr. Cotroneo fluconazole. I am also having him maintain his ICAPS MV, lubiprostone, omeprazole, Ranibizumab (LUCENTIS IO), magic mouthwash w/lidocaine, ELIQUIS, ZETIA, latanoprost, predniSONE, lidocaine, and clobetasol.  Meds ordered this encounter  Medications  . fluconazole (DIFLUCAN) 100 MG tablet    Sig: Take 1 tablet (100 mg total) by mouth once a week. Take one by mouth    Dispense:  4 tablet    Refill:  5     Penni Homans, MD

## 2016-05-31 NOTE — Assessment & Plan Note (Signed)
Diagnosed after patient requested bone density testing at suggestion of another physician due to long term use of PPIs. Insurance refused to pay for testing for this reason so test was ordered for steroid use. His vitamin D was tested and supplemented. Recommend calcium intake of 1200 to 1500 mg daily, divided into roughly 3 doses. Best source is the diet and a single dairy serving is about 500 mg, a supplement of calcium citrate once or twice daily to balance diet is fine if not getting enough in diet. Also need Vitamin D 2000 IU caps, 1 cap daily if not already taking vitamin D. Also recommend weight baring exercise on hips and upper body to keep bones strong. Prescribed Fosamax weekly

## 2016-05-31 NOTE — Assessment & Plan Note (Signed)
Encouraged heart healthy diet, increase exercise, avoid trans fats, consider a krill oil cap daily 

## 2016-06-01 ENCOUNTER — Other Ambulatory Visit: Payer: Self-pay

## 2016-06-05 ENCOUNTER — Other Ambulatory Visit: Payer: Self-pay | Admitting: Internal Medicine

## 2016-06-11 ENCOUNTER — Other Ambulatory Visit: Payer: Self-pay | Admitting: Family Medicine

## 2016-06-15 ENCOUNTER — Encounter: Payer: Self-pay | Admitting: Family Medicine

## 2016-06-23 ENCOUNTER — Ambulatory Visit (INDEPENDENT_AMBULATORY_CARE_PROVIDER_SITE_OTHER): Payer: Medicare Other | Admitting: Behavioral Health

## 2016-06-23 DIAGNOSIS — Z23 Encounter for immunization: Secondary | ICD-10-CM

## 2016-06-23 NOTE — Progress Notes (Addendum)
Pre visit review using our clinic review tool, if applicable. No additional management support is needed unless otherwise documented below in the visit note.  Patient in clinic for Influenza & Pneumococcal (23) vaccination. Per Dr. Charlett Blake, patient is okay to have the pneumococcal vaccine administered today. IM's given in both the Right & Left Deltoid. Patient tolerated injections well.

## 2016-06-24 DIAGNOSIS — H353110 Nonexudative age-related macular degeneration, right eye, stage unspecified: Secondary | ICD-10-CM | POA: Diagnosis not present

## 2016-06-24 DIAGNOSIS — Z885 Allergy status to narcotic agent status: Secondary | ICD-10-CM | POA: Diagnosis not present

## 2016-06-24 DIAGNOSIS — H35372 Puckering of macula, left eye: Secondary | ICD-10-CM | POA: Diagnosis not present

## 2016-06-24 DIAGNOSIS — H353210 Exudative age-related macular degeneration, right eye, stage unspecified: Secondary | ICD-10-CM | POA: Diagnosis not present

## 2016-06-24 DIAGNOSIS — Z87891 Personal history of nicotine dependence: Secondary | ICD-10-CM | POA: Diagnosis not present

## 2016-06-24 DIAGNOSIS — Z888 Allergy status to other drugs, medicaments and biological substances status: Secondary | ICD-10-CM | POA: Diagnosis not present

## 2016-06-24 DIAGNOSIS — H401124 Primary open-angle glaucoma, left eye, indeterminate stage: Secondary | ICD-10-CM | POA: Diagnosis not present

## 2016-06-24 DIAGNOSIS — H353221 Exudative age-related macular degeneration, left eye, with active choroidal neovascularization: Secondary | ICD-10-CM | POA: Diagnosis not present

## 2016-06-24 DIAGNOSIS — H2513 Age-related nuclear cataract, bilateral: Secondary | ICD-10-CM | POA: Diagnosis not present

## 2016-06-24 DIAGNOSIS — H35361 Drusen (degenerative) of macula, right eye: Secondary | ICD-10-CM | POA: Diagnosis not present

## 2016-07-02 DIAGNOSIS — M5136 Other intervertebral disc degeneration, lumbar region: Secondary | ICD-10-CM | POA: Diagnosis not present

## 2016-07-02 DIAGNOSIS — M4806 Spinal stenosis, lumbar region: Secondary | ICD-10-CM | POA: Diagnosis not present

## 2016-07-02 DIAGNOSIS — M4726 Other spondylosis with radiculopathy, lumbar region: Secondary | ICD-10-CM | POA: Diagnosis not present

## 2016-07-14 DIAGNOSIS — D225 Melanocytic nevi of trunk: Secondary | ICD-10-CM | POA: Diagnosis not present

## 2016-07-14 DIAGNOSIS — L814 Other melanin hyperpigmentation: Secondary | ICD-10-CM | POA: Diagnosis not present

## 2016-07-14 DIAGNOSIS — B372 Candidiasis of skin and nail: Secondary | ICD-10-CM | POA: Diagnosis not present

## 2016-07-14 DIAGNOSIS — D1801 Hemangioma of skin and subcutaneous tissue: Secondary | ICD-10-CM | POA: Diagnosis not present

## 2016-07-14 DIAGNOSIS — L821 Other seborrheic keratosis: Secondary | ICD-10-CM | POA: Diagnosis not present

## 2016-07-14 DIAGNOSIS — Z23 Encounter for immunization: Secondary | ICD-10-CM | POA: Diagnosis not present

## 2016-07-14 DIAGNOSIS — L219 Seborrheic dermatitis, unspecified: Secondary | ICD-10-CM | POA: Diagnosis not present

## 2016-08-10 ENCOUNTER — Other Ambulatory Visit (INDEPENDENT_AMBULATORY_CARE_PROVIDER_SITE_OTHER): Payer: PRIVATE HEALTH INSURANCE

## 2016-08-10 DIAGNOSIS — D649 Anemia, unspecified: Secondary | ICD-10-CM

## 2016-08-10 LAB — CBC WITH DIFFERENTIAL/PLATELET
Basophils Absolute: 0 10*3/uL (ref 0.0–0.1)
Basophils Relative: 0.4 % (ref 0.0–3.0)
Eosinophils Absolute: 0.1 10*3/uL (ref 0.0–0.7)
Eosinophils Relative: 0.8 % (ref 0.0–5.0)
HCT: 40.7 % (ref 39.0–52.0)
Hemoglobin: 14 g/dL (ref 13.0–17.0)
Lymphocytes Relative: 26.3 % (ref 12.0–46.0)
Lymphs Abs: 2.1 10*3/uL (ref 0.7–4.0)
MCHC: 34.3 g/dL (ref 30.0–36.0)
MCV: 90.5 fl (ref 78.0–100.0)
Monocytes Absolute: 0.7 10*3/uL (ref 0.1–1.0)
Monocytes Relative: 9 % (ref 3.0–12.0)
Neutro Abs: 5 10*3/uL (ref 1.4–7.7)
Neutrophils Relative %: 63.5 % (ref 43.0–77.0)
Platelets: 214 10*3/uL (ref 150.0–400.0)
RBC: 4.5 Mil/uL (ref 4.22–5.81)
RDW: 13.6 % (ref 11.5–15.5)
WBC: 7.8 10*3/uL (ref 4.0–10.5)

## 2016-08-18 ENCOUNTER — Other Ambulatory Visit: Payer: Self-pay | Admitting: Family Medicine

## 2016-08-19 ENCOUNTER — Other Ambulatory Visit: Payer: PRIVATE HEALTH INSURANCE

## 2016-09-02 DIAGNOSIS — H353190 Nonexudative age-related macular degeneration, unspecified eye, stage unspecified: Secondary | ICD-10-CM | POA: Diagnosis not present

## 2016-09-02 DIAGNOSIS — Z888 Allergy status to other drugs, medicaments and biological substances status: Secondary | ICD-10-CM | POA: Diagnosis not present

## 2016-09-02 DIAGNOSIS — H401124 Primary open-angle glaucoma, left eye, indeterminate stage: Secondary | ICD-10-CM | POA: Diagnosis not present

## 2016-09-02 DIAGNOSIS — Z886 Allergy status to analgesic agent status: Secondary | ICD-10-CM | POA: Diagnosis not present

## 2016-09-02 DIAGNOSIS — H353110 Nonexudative age-related macular degeneration, right eye, stage unspecified: Secondary | ICD-10-CM | POA: Diagnosis not present

## 2016-09-02 DIAGNOSIS — Z87891 Personal history of nicotine dependence: Secondary | ICD-10-CM | POA: Diagnosis not present

## 2016-09-02 DIAGNOSIS — Z881 Allergy status to other antibiotic agents status: Secondary | ICD-10-CM | POA: Diagnosis not present

## 2016-09-02 DIAGNOSIS — H353221 Exudative age-related macular degeneration, left eye, with active choroidal neovascularization: Secondary | ICD-10-CM | POA: Diagnosis not present

## 2016-09-02 DIAGNOSIS — Z885 Allergy status to narcotic agent status: Secondary | ICD-10-CM | POA: Diagnosis not present

## 2016-09-02 DIAGNOSIS — H25813 Combined forms of age-related cataract, bilateral: Secondary | ICD-10-CM | POA: Diagnosis not present

## 2016-09-14 DIAGNOSIS — H2513 Age-related nuclear cataract, bilateral: Secondary | ICD-10-CM | POA: Diagnosis not present

## 2016-09-14 DIAGNOSIS — Z0100 Encounter for examination of eyes and vision without abnormal findings: Secondary | ICD-10-CM | POA: Diagnosis not present

## 2016-10-05 ENCOUNTER — Other Ambulatory Visit: Payer: Self-pay | Admitting: Family Medicine

## 2016-10-17 ENCOUNTER — Other Ambulatory Visit: Payer: Self-pay | Admitting: Family Medicine

## 2016-10-22 DIAGNOSIS — M48061 Spinal stenosis, lumbar region without neurogenic claudication: Secondary | ICD-10-CM | POA: Diagnosis not present

## 2016-10-22 DIAGNOSIS — M4726 Other spondylosis with radiculopathy, lumbar region: Secondary | ICD-10-CM | POA: Diagnosis not present

## 2016-10-22 DIAGNOSIS — M5416 Radiculopathy, lumbar region: Secondary | ICD-10-CM | POA: Diagnosis not present

## 2016-10-22 DIAGNOSIS — M5136 Other intervertebral disc degeneration, lumbar region: Secondary | ICD-10-CM | POA: Diagnosis not present

## 2016-11-04 DIAGNOSIS — H401124 Primary open-angle glaucoma, left eye, indeterminate stage: Secondary | ICD-10-CM | POA: Diagnosis not present

## 2016-11-04 DIAGNOSIS — H353221 Exudative age-related macular degeneration, left eye, with active choroidal neovascularization: Secondary | ICD-10-CM | POA: Diagnosis not present

## 2016-11-04 DIAGNOSIS — H353190 Nonexudative age-related macular degeneration, unspecified eye, stage unspecified: Secondary | ICD-10-CM | POA: Diagnosis not present

## 2016-11-04 DIAGNOSIS — H25813 Combined forms of age-related cataract, bilateral: Secondary | ICD-10-CM | POA: Diagnosis not present

## 2016-11-04 DIAGNOSIS — H353110 Nonexudative age-related macular degeneration, right eye, stage unspecified: Secondary | ICD-10-CM | POA: Diagnosis not present

## 2016-11-13 ENCOUNTER — Encounter: Payer: Self-pay | Admitting: Internal Medicine

## 2016-11-17 DIAGNOSIS — H401121 Primary open-angle glaucoma, left eye, mild stage: Secondary | ICD-10-CM | POA: Diagnosis not present

## 2016-11-17 DIAGNOSIS — H401124 Primary open-angle glaucoma, left eye, indeterminate stage: Secondary | ICD-10-CM | POA: Diagnosis not present

## 2016-11-18 ENCOUNTER — Ambulatory Visit (INDEPENDENT_AMBULATORY_CARE_PROVIDER_SITE_OTHER): Payer: Medicare Other | Admitting: Internal Medicine

## 2016-11-18 ENCOUNTER — Ambulatory Visit (INDEPENDENT_AMBULATORY_CARE_PROVIDER_SITE_OTHER): Payer: Medicare Other

## 2016-11-18 ENCOUNTER — Encounter: Payer: Self-pay | Admitting: Internal Medicine

## 2016-11-18 VITALS — BP 132/70 | HR 64 | Ht 65.0 in | Wt 169.4 lb

## 2016-11-18 DIAGNOSIS — R002 Palpitations: Secondary | ICD-10-CM

## 2016-11-18 DIAGNOSIS — I480 Paroxysmal atrial fibrillation: Secondary | ICD-10-CM | POA: Diagnosis not present

## 2016-11-18 NOTE — Patient Instructions (Addendum)
Medication Instructions:  Your physician recommends that you continue on your current medications as directed. Please refer to the Current Medication list given to you today.   Labwork: None Ordered   Testing/Procedures: Your physician has recommended that you wear an event monitor for 30 days. Event monitors are medical devices that record the heart's electrical activity. Doctors most often [place] these monitors to diagnose arrhythmias. Arrhythmias are problems with the speed or rhythm of the heartbeat. The monitor is a small, portable device. You can wear one while you do your normal daily activities. This is usually used to diagnose what is causing palpitations/syncope (passing out).    Follow-Up: Follow-up to be determined  Any Other Special Instructions Will Be Listed Below (If Applicable).     If you need a refill on your cardiac medications before your next appointment, please call your pharmacy.

## 2016-11-18 NOTE — Progress Notes (Signed)
HPI The patient is a 71 yo man with a h/o syncope a couple of years ago who was offered a loop recorder and refused. He carries a h/o atrial fib although I cannot find much documentation of this. There is also a remote h/o flutter. He had an episode of palpitations which resolved spontaneously. He remains on chronic anti-coagulation. Since I saw him last, he has had an increase in the frequency of his palpitations, and they are occurring several times a week. He has not had syncope. He has had no bleeding problems except for a little bruisability.  Allergies  Allergen Reactions  . Polymyxin B-Trimethoprim Swelling    Eye drops made eyes swell  . Codeine Rash and Itching  . Guaifenesin & Derivatives     Other reaction(s): Hallucinations  . Statins Other (See Comments)    Muscle aches   . Atorvastatin Other (See Comments)    Muscle aches and cramps  . Avelox [Moxifloxacin] Other (See Comments)    Stomach cramps  . Guaifenesin Diarrhea  . Meloxicam Other (See Comments)    unknown  . Mucinex D [Pseudoephedrine-Guaifenesin Er] Nausea And Vomiting    Stomach cramps  . Polymyxin B Other (See Comments)    Swelling  . Pseudoephedrine     Stomach cramps  . Pseudoephedrine-Guaifenesin Nausea And Vomiting    Stomach cramps  . Rosuvastatin Calcium Other (See Comments)    Muscle aches  . Simvastatin Other (See Comments)    Muscle aches and cramps  . Tapentadol Other (See Comments)    unknown  . Vioxx [Rofecoxib] Other (See Comments)    Stomach cramping   . Ciprofloxacin Itching, Rash and Nausea Only  . Codeine Phosphate Itching and Rash     Current Outpatient Prescriptions  Medication Sig Dispense Refill  . clobetasol (TEMOVATE) 0.05 % external solution APPLY 1 APPLICATION TOPICALLY TWICE DAILY 50 mL 0  . ELIQUIS 5 MG TABS tablet Take 1 tablet by mouth two  times daily 180 tablet 3  . ezetimibe (ZETIA) 10 MG tablet TAKE 1 TABLET BY MOUTH  DAILY WITH SUPPER 90 tablet 0  .  fluconazole (DIFLUCAN) 100 MG tablet Take 1 tablet (100 mg total) by mouth once a week. Take one by mouth 4 tablet 5  . latanoprost (XALATAN) 0.005 % ophthalmic solution Place 1 drop into the left eye at bedtime.     Marland Kitchen lubiprostone (AMITIZA) 8 MCG capsule Take 1-2 tablets by mouth a day    . magic mouthwash w/lidocaine SOLN Take 5 mLs by mouth 4 (four) times daily as needed for mouth pain. 5 mL 1  . Multiple Vitamins-Minerals (ICAPS MV) TABS Take 1 tablet by mouth 2 (two) times daily.     Marland Kitchen omeprazole (PRILOSEC OTC) 20 MG tablet Take 20 mg by mouth daily.    . propranolol (INDERAL) 10 MG tablet TAKE 1 TABLET BY MOUTH DAILY AS NEEDED AS DIRECTED BY MD 90 tablet 0  . Ranibizumab (LUCENTIS IO) Inject into the eye as directed. (Macular Degeneration)     No current facility-administered medications for this visit.      Past Medical History:  Diagnosis Date  . Anemia   . Atrial flutter ([place]) 03/31/2012   converted spontaneously to sinus  . CAD (coronary artery disease)    reportedly moderate CAD; managed medically  . COPD (chronic obstructive pulmonary disease) ([place])   . Eczema 08/03/2014  . GERD (gastroesophageal reflux disease)   . H/O: rheumatic fever   .  Heart murmur   . Heart murmur   . High risk medication use    on amiodarone since 03/31/2012  . History of colonoscopy   . Hyperlipidemia   . Hypertension   . Kidney stones   . Macular degeneration   . Osteoporosis 05/31/2016    ROS:   All systems reviewed and negative except as noted in the HPI.   Past Surgical History:  Procedure Laterality Date  . APPENDECTOMY    . CARDIAC CATHETERIZATION  04/22/2011   moderate left main and RCA stenosis not significant by FFR and IVUS on medical therapy  . CERVICAL DISCECTOMY     C5,6,7 disc fused  with plate and 5 1" screws  . CHOLECYSTECTOMY    . FOOT SURGERY     right calcification removed from top of foot  . knee cartiledge Right    right knee  . MOUTH SURGERY     periodontal  surgery, bridges, splint in front,      Family History  Problem Relation Age of Onset  . Heart disease Mother   . Diabetes Mother   . Cirrhosis Mother   . Emphysema Mother     never smoked but 2nd hand through her spouse  . Hypertension Mother   . Macular degeneration Mother   . Heart disease Father   . Cancer Father     prostate  . Hyperlipidemia Father   . Hypertension Father   . Varicose Veins Father   . Heart attack Father   . Peripheral vascular disease Father   . Heart disease Sister   . Arthritis Sister   . Hyperlipidemia Sister   . Obesity Sister   . Macular degeneration Sister   . Heart disease Brother     5 stents  . Hyperlipidemia Brother   . Macular degeneration Maternal Grandfather   . Cirrhosis Sister   . Obesity Sister   . Arthritis Sister   . Heart disease Sister   . Obesity Sister   . Liver disease    . Prostate cancer    . Coronary artery disease       Social History   Social History  . Marital status: Married    Spouse name: N/A  . Number of children: 0  . Years of education: N/A   Occupational History  . retired Retired   Social History Main Topics  . Smoking status: Former Smoker    Packs/day: 0.50    Years: 50.00    Types: Cigarettes    Quit date: 05/01/2013  . Smokeless tobacco: Former Systems developer     Comment: using e-Cig//ldc  . Alcohol use No  . Drug use: No  . Sexual activity: Yes     Comment: lives with wife, no dietary restrictions   Other Topics Concern  . Not on file   Social History Narrative  . No narrative on file     BP 132/70   Pulse 64   Ht '5\' 5"'$  (1.651 m)   Wt 169 lb 6.4 oz (76.8 kg)   SpO2 98%   BMI 28.19 kg/m   Physical Exam:  Well appearing 71 yo man, NAD HEENT: Unremarkable Neck:  6 cm JVD, no thyromegally Lymphatics:  No adenopathy Back:  No CVA tenderness Lungs:  Clear with no wheezes HEART:  Regular rate rhythm, no murmurs, no rubs, no clicks Abd:  soft, positive bowel sounds, no  organomegally, no rebound, no guarding Ext:  2 plus pulses, no edema, no cyanosis, no clubbing  Skin:  No rashes no nodules Neuro:  CN II through XII intact, motor grossly intact  EKG - nsr   Assess/Plan: 1. Atrial fib - he is in NSR. His symptoms have worsened. I am not sure if he is having atrial fib, sinus tachy, atrial flutter or something else. I have asked him to wear a cardiac montior for 4 weeks. If he is having atrial fib, might have him start taking long acting metoprolol. 2. HTN - his blood pressure is well controlled.  3. Dyslipidemia - he will continue Zetia. 4. Coags - he will continue Eliquis for now.  Mikle Bosworth.D.

## 2016-11-20 ENCOUNTER — Ambulatory Visit (INDEPENDENT_AMBULATORY_CARE_PROVIDER_SITE_OTHER): Payer: Medicare Other | Admitting: Family Medicine

## 2016-11-20 VITALS — BP 138/66 | HR 64 | Temp 98.0°F | Wt 170.4 lb

## 2016-11-20 DIAGNOSIS — K219 Gastro-esophageal reflux disease without esophagitis: Secondary | ICD-10-CM | POA: Diagnosis not present

## 2016-11-20 DIAGNOSIS — I10 Essential (primary) hypertension: Secondary | ICD-10-CM

## 2016-11-20 DIAGNOSIS — E782 Mixed hyperlipidemia: Secondary | ICD-10-CM | POA: Diagnosis not present

## 2016-11-20 DIAGNOSIS — B370 Candidal stomatitis: Secondary | ICD-10-CM

## 2016-11-20 DIAGNOSIS — Z0000 Encounter for general adult medical examination without abnormal findings: Secondary | ICD-10-CM

## 2016-11-20 DIAGNOSIS — M810 Age-related osteoporosis without current pathological fracture: Secondary | ICD-10-CM | POA: Diagnosis not present

## 2016-11-20 LAB — CBC
HCT: 38.1 % — ABNORMAL LOW (ref 39.0–52.0)
Hemoglobin: 13.2 g/dL (ref 13.0–17.0)
MCHC: 34.7 g/dL (ref 30.0–36.0)
MCV: 90.8 fl (ref 78.0–100.0)
Platelets: 202 10*3/uL (ref 150.0–400.0)
RBC: 4.2 Mil/uL — ABNORMAL LOW (ref 4.22–5.81)
RDW: 13.1 % (ref 11.5–15.5)
WBC: 7.7 10*3/uL (ref 4.0–10.5)

## 2016-11-20 LAB — COMPREHENSIVE METABOLIC PANEL
ALT: 14 U/L (ref 0–53)
AST: 15 U/L (ref 0–37)
Albumin: 3.8 g/dL (ref 3.5–5.2)
Alkaline Phosphatase: 49 U/L (ref 39–117)
BUN: 15 mg/dL (ref 6–23)
CO2: 26 mEq/L (ref 19–32)
Calcium: 9.2 mg/dL (ref 8.4–10.5)
Chloride: 104 mEq/L (ref 96–112)
Creatinine, Ser: 0.93 mg/dL (ref 0.40–1.50)
GFR: 85.41 mL/min (ref >60.00)
Glucose, Bld: 75 mg/dL (ref 70–99)
Potassium: 4.1 mEq/L (ref 3.5–5.1)
Sodium: 136 mEq/L (ref 135–145)
Total Bilirubin: 0.5 mg/dL (ref 0.2–1.2)
Total Protein: 6.7 g/dL (ref 6.0–8.3)

## 2016-11-20 LAB — TSH: TSH: 1.37 u[IU]/mL (ref 0.35–4.50)

## 2016-11-20 LAB — LIPID PANEL
Cholesterol: 183 mg/dL (ref 0–200)
HDL: 49.9 mg/dL (ref >39.00)
LDL Cholesterol: 118 mg/dL — ABNORMAL HIGH (ref 0–99)
NonHDL: 133.17
Total CHOL/HDL Ratio: 4
Triglycerides: 75 mg/dL (ref 0.0–149.0)
VLDL: 15 mg/dL (ref 0.0–40.0)

## 2016-11-20 LAB — VITAMIN D 25 HYDROXY (VIT D DEFICIENCY, FRACTURES): VITD: 46.13 ng/mL (ref 30.00–100.00)

## 2016-11-20 MED ORDER — CLOBETASOL PROPIONATE 0.05 % EX SOLN: CUTANEOUS | 3 refills | Status: DC

## 2016-11-20 NOTE — Progress Notes (Signed)
Patient ID: Ryan [name], male   DOB: 1946-07-30, 71 y.o.   MRN: [id number]   Subjective:    Patient ID: Ryan [name], male    DOB: 08-12-1946, 71 y.o.   MRN: [id number]  Chief Complaint  Patient presents with  . Follow-up  . Hyperlipidemia  . Hypertension  I acted as a Education administrator for Dr. Charlett Blake. Princess, RMA   Hyperlipidemia  This is a chronic problem. Pertinent negatives include no chest pain or shortness of breath.  Hypertension  Pertinent negatives include no blurred vision, chest pain, headaches, malaise/fatigue, palpitations or shortness of breath.   Patient is in today for follow up on hypertension, hyperlipidemia and other medical conditions. Patient has no acute concerns. He feels well. Has continued to have some trouble with intermittent thrush and irritation on his tongue at times. Denies CP/palp/SOB/HA/congestion/fevers/GI or GU c/o. Taking meds as prescribed  Past Medical History:  Diagnosis Date  . Anemia   . Atrial flutter ([place]) 03/31/2012   converted spontaneously to sinus  . CAD (coronary artery disease)    reportedly moderate CAD; managed medically  . COPD (chronic obstructive pulmonary disease) ([place])   . Eczema 08/03/2014  . GERD (gastroesophageal reflux disease)   . H/O: rheumatic fever   . Heart murmur   . Heart murmur   . High risk medication use    on amiodarone since 03/31/2012  . History of colonoscopy   . Hyperlipidemia   . Hypertension   . Kidney stones   . Macular degeneration   . Osteoporosis 05/31/2016    Past Surgical History:  Procedure Laterality Date  . APPENDECTOMY    . CARDIAC CATHETERIZATION  04/22/2011   moderate left main and RCA stenosis not significant by FFR and IVUS on medical therapy  . CERVICAL DISCECTOMY     C5,6,7 disc fused  with plate and 5 1" screws  . CHOLECYSTECTOMY    . FOOT SURGERY     right calcification removed from top of foot  . knee cartiledge Right    right knee  . MOUTH SURGERY     periodontal  surgery, bridges, splint in front,     Family History  Problem Relation Age of Onset  . Heart disease Mother   . Diabetes Mother   . Cirrhosis Mother   . Emphysema Mother     never smoked but 2nd hand through her spouse  . Hypertension Mother   . Macular degeneration Mother   . Heart disease Father   . Cancer Father     prostate  . Hyperlipidemia Father   . Hypertension Father   . Varicose Veins Father   . Heart attack Father   . Peripheral vascular disease Father   . Heart disease Sister   . Arthritis Sister   . Hyperlipidemia Sister   . Obesity Sister   . Macular degeneration Sister   . Heart disease Brother     5 stents  . Hyperlipidemia Brother   . Macular degeneration Maternal Grandfather   . Cirrhosis Sister   . Obesity Sister   . Arthritis Sister   . Heart disease Sister   . Obesity Sister   . Liver disease    . Prostate cancer    . Coronary artery disease      Social History   Social History  . Marital status: Married    Spouse name: N/A  . Number of children: 0  . Years of education: N/A   Occupational History  . retired  Retired   Social History Main Topics  . Smoking status: Former Smoker    Packs/day: 0.50    Years: 50.00    Types: Cigarettes    Quit date: 05/01/2013  . Smokeless tobacco: Former Systems developer     Comment: using e-Cig//ldc  . Alcohol use No  . Drug use: No  . Sexual activity: Yes     Comment: lives with wife, no dietary restrictions   Other Topics Concern  . Not on file   Social History Narrative  . No narrative on file    Outpatient Medications Prior to Visit  Medication Sig Dispense Refill  . ELIQUIS 5 MG TABS tablet Take 1 tablet by mouth two  times daily 180 tablet 3  . ezetimibe (ZETIA) 10 MG tablet TAKE 1 TABLET BY MOUTH  DAILY WITH SUPPER 90 tablet 0  . fluconazole (DIFLUCAN) 100 MG tablet Take 1 tablet (100 mg total) by mouth once a week. Take one by mouth 4 tablet 5  . latanoprost (XALATAN) 0.005 % ophthalmic  solution Place 1 drop into the left eye at bedtime.     Marland Kitchen lubiprostone (AMITIZA) 8 MCG capsule Take 1-2 tablets by mouth a day    . magic mouthwash w/lidocaine SOLN Take 5 mLs by mouth 4 (four) times daily as needed for mouth pain. 5 mL 1  . Multiple Vitamins-Minerals (ICAPS MV) TABS Take 1 tablet by mouth 2 (two) times daily.     Marland Kitchen omeprazole (PRILOSEC OTC) 20 MG tablet Take 20 mg by mouth daily.    . propranolol (INDERAL) 10 MG tablet TAKE 1 TABLET BY MOUTH DAILY AS NEEDED AS DIRECTED BY MD 90 tablet 0  . Ranibizumab (LUCENTIS IO) Inject into the eye as directed. (Macular Degeneration)    . clobetasol (TEMOVATE) 0.05 % external solution APPLY 1 APPLICATION TOPICALLY TWICE DAILY 50 mL 0   No facility-administered medications prior to visit.     Allergies  Allergen Reactions  . Polymyxin B-Trimethoprim Swelling    Eye drops made eyes swell  . Codeine Rash and Itching  . Guaifenesin & Derivatives     Other reaction(s): Hallucinations  . Statins Other (See Comments)    Muscle aches, Simvastatin,Atorvstatin,   . Avelox [Moxifloxacin] Other (See Comments)    Stomach cramps  . Guaifenesin Diarrhea  . Mucinex D [Pseudoephedrine-Guaifenesin Er] Nausea And Vomiting    Stomach cramps  . Polymyxin B Other (See Comments)    Swelling  . Pseudoephedrine     Stomach cramps  . Pseudoephedrine-Guaifenesin Nausea And Vomiting    Stomach cramps  . Rosuvastatin Calcium Other (See Comments)    Muscle aches  . Simvastatin Other (See Comments)    Muscle aches and cramps  . Tapentadol Other (See Comments)    unknown  . Vioxx [Rofecoxib] Other (See Comments)    Stomach cramping   . Ciprofloxacin Itching, Rash and Nausea Only  . Codeine Phosphate Itching and Rash    Review of Systems  Constitutional: Negative for fever and malaise/fatigue.  HENT: Negative for congestion.   Eyes: Negative for blurred vision.  Respiratory: Negative for cough and shortness of breath.   Cardiovascular: Negative  for chest pain, palpitations and leg swelling.  Gastrointestinal: Negative for vomiting.  Musculoskeletal: Negative for back pain.  Skin: Negative for rash.  Neurological: Negative for loss of consciousness and headaches.       Objective:    Physical Exam  Constitutional: He is oriented to person, place, and time. He appears well-developed and  well-nourished. No distress.  HENT:  Head: Normocephalic and atraumatic.  Eyes: Conjunctivae are normal.  Neck: Normal range of motion. No thyromegaly present.  Cardiovascular: Normal rate and regular rhythm.   Pulmonary/Chest: Effort normal and breath sounds normal. He has no wheezes.  Abdominal: Soft. Bowel sounds are normal. There is no tenderness.  Musculoskeletal: Normal range of motion. He exhibits no edema or deformity.  Neurological: He is alert and oriented to person, place, and time.  Skin: Skin is warm and dry. He is not diaphoretic.  Psychiatric: He has a normal mood and affect.    BP 138/66 (BP Location: Left Arm, Patient Position: Sitting, Cuff Size: Normal)   Pulse 64   Temp 98 F (36.7 C) (Oral)   Wt 170 lb 6.4 oz (77.3 kg)   BMI 28.36 kg/m  Wt Readings from Last 3 Encounters:  11/20/16 170 lb 6.4 oz (77.3 kg)  11/18/16 169 lb 6.4 oz (76.8 kg)  05/22/16 161 lb 9.6 oz (73.3 kg)     Lab Results  Component Value Date   WBC 7.7 11/20/2016   HGB 13.2 11/20/2016   HCT 38.1 (L) 11/20/2016   PLT 202.0 11/20/2016   GLUCOSE 75 11/20/2016   CHOL 183 11/20/2016   TRIG 75.0 11/20/2016   HDL 49.90 11/20/2016   LDLDIRECT 134.2 12/01/2013   LDLCALC 118 (H) 11/20/2016   ALT 14 11/20/2016   AST 15 11/20/2016   NA 136 11/20/2016   K 4.1 11/20/2016   CL 104 11/20/2016   CREATININE 0.93 11/20/2016   BUN 15 11/20/2016   CO2 26 11/20/2016   TSH 1.37 11/20/2016   PSA 1.64 01/14/2015   INR 1.17 04/01/2012    Lab Results  Component Value Date   TSH 1.37 11/20/2016   Lab Results  Component Value Date   WBC 7.7  11/20/2016   HGB 13.2 11/20/2016   HCT 38.1 (L) 11/20/2016   MCV 90.8 11/20/2016   PLT 202.0 11/20/2016   Lab Results  Component Value Date   NA 136 11/20/2016   K 4.1 11/20/2016   CO2 26 11/20/2016   GLUCOSE 75 11/20/2016   BUN 15 11/20/2016   CREATININE 0.93 11/20/2016   BILITOT 0.5 11/20/2016   ALKPHOS 49 11/20/2016   AST 15 11/20/2016   ALT 14 11/20/2016   PROT 6.7 11/20/2016   ALBUMIN 3.8 11/20/2016   CALCIUM 9.2 11/20/2016   GFR 85.41 11/20/2016   Lab Results  Component Value Date   CHOL 183 11/20/2016   Lab Results  Component Value Date   HDL 49.90 11/20/2016   Lab Results  Component Value Date   LDLCALC 118 (H) 11/20/2016   Lab Results  Component Value Date   TRIG 75.0 11/20/2016   Lab Results  Component Value Date   CHOLHDL 4 11/20/2016   No results found for: HGBA1C     Assessment & Plan:   Problem List Items Addressed This Visit    Hyperlipidemia, mixed    Encouraged heart healthy diet, increase exercise, avoid trans fats, consider a krill oil cap daily. Wants to stop the Zetia due to cost of around $400.       Relevant Orders   Lipid panel (Completed)   Lipid panel   Essential hypertension    Well controlled, no changes to meds. Encouraged heart healthy diet such as the DASH diet and exercise as tolerated.       Relevant Orders   CBC (Completed)   Comprehensive metabolic panel (Completed)  TSH (Completed)   CBC   Comprehensive metabolic panel   TSH   GERD    Avoid offending foods, start probiotics. Do not eat large meals in late evening and consider raising head of bed.       Medicare annual wellness visit, subsequent - Primary   Thrush    Will adjust Diflucan to 150 tabs, 2 tabs po qd x 3 days then 1 per week x 3 when this flares, minimize simple carbs and take a daily probiotic.      Osteoporosis    Encouraged to get adequate exercise, calcium and vitamin d intake      Relevant Orders   VITAMIN D 25 Hydroxy (Vit-D  Deficiency, Fractures) (Completed)   VITAMIN D 25 Hydroxy (Vit-D Deficiency, Fractures)      I am having Mr. Carlota Raspberry maintain his ICAPS MV, lubiprostone, omeprazole, Ranibizumab (LUCENTIS IO), magic mouthwash w/lidocaine, latanoprost, fluconazole, propranolol, ELIQUIS, ezetimibe, and clobetasol.  Meds ordered this encounter  Medications  . clobetasol (TEMOVATE) 0.05 % external solution    Sig: APPLY 1 APPLICATION TOPICALLY TWICE DAILY    Dispense:  100 mL    Refill:  3    CMA served as scribe during this visit. History, Physical and Plan performed by medical provider. Documentation and orders reviewed and attested to.  Penni Homans, MD

## 2016-11-20 NOTE — Assessment & Plan Note (Addendum)
Encouraged heart healthy diet, increase exercise, avoid trans fats, consider a krill oil cap daily. Wants to stop the Zetia due to cost of around $400.

## 2016-11-20 NOTE — Patient Instructions (Signed)
Hypertension Hypertension is another name for high blood pressure. High blood pressure forces your heart to work harder to pump blood. A blood pressure reading has two numbers, which includes a higher number over a lower number (example: 110/72). Follow these instructions at home:  Have your blood pressure rechecked by your doctor.  Only take medicine as told by your doctor. Follow the directions carefully. The medicine does not work as well if you skip doses. Skipping doses also puts you at risk for problems.  Do not smoke.  Monitor your blood pressure at home as told by your doctor. Contact a doctor if:  You think you are having a reaction to the medicine you are taking.  You have repeat headaches or feel dizzy.  You have puffiness (swelling) in your ankles.  You have trouble with your vision. Get help right away if:  You get a very bad headache and are confused.  You feel weak, numb, or faint.  You get chest or belly (abdominal) pain.  You throw up (vomit).  You cannot breathe very well. This information is not intended to replace advice given to you by your health care provider. Make sure you discuss any questions you have with your health care provider. Document Released: 03/09/2008 Document Revised: 02/27/2016 Document Reviewed: 07/14/2013 Elsevier Interactive Patient Education  2017 [employer] Inc.  

## 2016-11-20 NOTE — Assessment & Plan Note (Signed)
Well controlled, no changes to meds. Encouraged heart healthy diet such as the DASH diet and exercise as tolerated.  °

## 2016-11-20 NOTE — Progress Notes (Signed)
Pre visit review using our clinic review tool, if applicable. No additional management support is needed unless otherwise documented below in the visit note. 

## 2016-11-20 NOTE — Assessment & Plan Note (Signed)
Encouraged to get adequate exercise, calcium and vitamin d intake 

## 2016-11-23 NOTE — Assessment & Plan Note (Signed)
Will adjust Diflucan to 150 tabs, 2 tabs po qd x 3 days then 1 per week x 3 when this flares, minimize simple carbs and take a daily probiotic.

## 2016-11-23 NOTE — Assessment & Plan Note (Signed)
Avoid offending foods, start probiotics. Do not eat large meals in late evening and consider raising head of bed.  

## 2016-11-24 ENCOUNTER — Telehealth: Payer: Self-pay

## 2016-11-24 MED ORDER — METOPROLOL TARTRATE 50 MG PO TABS: 50.0000 mg | ORAL_TABLET | Freq: Two times a day (BID) | ORAL | 11 refills | Status: DC

## 2016-11-24 NOTE — Telephone Encounter (Signed)
Received strips and reviewed with Dr. Curt Bears (DOD).  Per Dr. Curt Bears, patient to START METOPROLOL 50 mg BID.  Called patient. He is still asymptomatic other than "being a little tired." BP 131/96. HR on BP monitor reads 84 but he is still in afib.  Instructed patient to START METOPROLOL 50 mg BID, starting now.  He will call tomorrow afternoon to follow-up with Dr. Tanna Furry nurse. He understands to go to the ED if his heart starts to race and he becomes symptomatic in the meantime. He was grateful for call.

## 2016-11-24 NOTE — Telephone Encounter (Signed)
Rajeen from [facility] called to report the patient had first documentation of Afib today at 730 CST with rate between 130-140. The patient sent recording reporting he was lightheaded and had CP. [facility] attempted to reach the patient but was unable. Representative states the patient is still in afib in the 140-150 range. Original tracings and follow-up current tracings requested to be faxed to the office.   Spoke with patient. He states this AM when he sent to recording, his primary complaint was dizziness. His chest felt tight but it wasn't painful. He had mild SOB. Now, the patient is asymptomatic, but he states he is still in Afib. He states his HR is slowly coming down and is probably around 100 bpm now. He took propanolol 10 mg 2 tablets over the course of the morning.  Instructed patient to send another transmission for review.  He will rest for the rest of the morning.

## 2016-11-25 ENCOUNTER — Telehealth: Payer: Self-pay | Admitting: Internal Medicine

## 2016-11-25 NOTE — Telephone Encounter (Signed)
Received incoming call from pt. Pt wanted to know if he should still keep event monitor on. Pt started Metoprolol yesterday. Pt stated BPs yesterday 114/66. HR 65. 114/70, HR 77. BP today 129/78, HR 65. At one point, pt stated his HR was 49. Pt checks HR with wrist monitor ([employer]). Informed wrist device is a guide for HR, but it may not be accurate. Pt is concerned about taking Metoprolol and his heart rate dropping. Informed pt to HOLD Metoprolol if HR sustains 50 or below and call our office. Read last note from Dr. Clovis Cao 11/18/16 stated if pt is having a-fib, may start long acting Metoprolol. Will forward to Dr. Lovena Le to advise.

## 2016-11-25 NOTE — Telephone Encounter (Signed)
New Message     Pt was in AFIB 11/24/16 wants to speak with Renae

## 2016-11-25 NOTE — Telephone Encounter (Signed)
New Message    Pt states that he is returning your call regarding his afib. He states he is letting you know that he is not available until after 2:15 and to please call then.

## 2016-12-01 NOTE — Telephone Encounter (Signed)
I agree with the recommendations below. Hold metoprolol for sustained HR below 50/min. GT

## 2016-12-02 NOTE — Telephone Encounter (Signed)
Called, spoke with pt. Pt stated HR has been 50s-mid 60s. Informed Dr. Lovena Le recommended to stay on Metoprolol and HOLD if HR sustains below 50. Informed to call our office with questions or concerns. Pt verbalized understanding and agreed with plan.

## 2016-12-13 ENCOUNTER — Other Ambulatory Visit: Payer: Self-pay | Admitting: Family Medicine

## 2016-12-14 NOTE — Telephone Encounter (Signed)
Medication filled to pharmacy as requested.   

## 2016-12-16 DIAGNOSIS — H401121 Primary open-angle glaucoma, left eye, mild stage: Secondary | ICD-10-CM | POA: Diagnosis not present

## 2016-12-30 ENCOUNTER — Encounter: Payer: Self-pay | Admitting: Internal Medicine

## 2017-01-07 ENCOUNTER — Other Ambulatory Visit: Payer: Self-pay | Admitting: Family Medicine

## 2017-01-08 ENCOUNTER — Telehealth: Payer: Self-pay | Admitting: Family Medicine

## 2017-01-08 NOTE — Telephone Encounter (Signed)
Patient came by the office to inform his clobetasol soln prop 0.05% soln 50 ml is costing him now $85. He asked if we could contact his insurance/PA to help. I did call the pharmacy and spoke to them.  They stated that no PA is required it is simple not a covered medication and they prefer others that are creams.  The pharmacy suggested to have him call his insurance company regarding this. So, did inform the patient nothing more to do that he would need to find out from them what may have changed. The pharmacy in looking into his past purchases stated he has paid as much as $75 for this so they were not sure what the problem was.  Pharmacy is [facility] in [place] [phone]

## 2017-01-11 ENCOUNTER — Encounter: Payer: Self-pay | Admitting: Family Medicine

## 2017-01-11 ENCOUNTER — Other Ambulatory Visit: Payer: Self-pay | Admitting: Family Medicine

## 2017-01-11 ENCOUNTER — Encounter: Payer: Self-pay | Admitting: Internal Medicine

## 2017-01-11 ENCOUNTER — Ambulatory Visit (INDEPENDENT_AMBULATORY_CARE_PROVIDER_SITE_OTHER): Payer: Medicare Other | Admitting: Internal Medicine

## 2017-01-11 VITALS — BP 110/60 | HR 66 | Ht 65.0 in | Wt 176.6 lb

## 2017-01-11 DIAGNOSIS — I480 Paroxysmal atrial fibrillation: Secondary | ICD-10-CM

## 2017-01-11 DIAGNOSIS — I517 Cardiomegaly: Secondary | ICD-10-CM | POA: Diagnosis not present

## 2017-01-11 MED ORDER — SOTALOL HCL 120 MG PO TABS: 120.0000 mg | ORAL_TABLET | Freq: Two times a day (BID) | ORAL | 11 refills | Status: DC

## 2017-01-11 MED ORDER — CLOBETASOL PROPIONATE 0.05 % EX SOLN: CUTANEOUS | 1 refills | Status: DC

## 2017-01-11 NOTE — Patient Instructions (Addendum)
Medication Instructions:  STOP Propranolol STOP Metoprolol START Sotalol 120 mg twice daily   Labwork: None Ordered   Testing/Procedures: Your physician has requested that you have an echocardiogram. Echocardiography is a painless test that uses sound waves to create images of your heart. It provides your doctor with information about the size and shape of your heart and how well your heart's chambers and valves are working. This procedure takes approximately one hour. There are no restrictions for this procedure.    Follow-Up: Your physician recommends that you schedule a Nurse Visit/EKG in: 2 days after starting Sotalol and again in 2 more days for evaluation of Sotalol therapy  Your physician recommends that you schedule a follow-up appointment in: 4-6 weeks for Dr. Lovena Le   If you need a refill on your cardiac medications before your next appointment, please call your pharmacy.   Thank you for choosing [facility]! Christen Bame, RN [phone]

## 2017-01-11 NOTE — Progress Notes (Signed)
HPI Mr. Langan returns today for ongoing evaluation and management of atrial fib and palpitations. He is a 71 yo man with a h/o syncope a couple of years ago who was offered a loop recorder and refused. He carries a h/o atrial fib and palpitations and underwent cardiac monitoring where he was noted to have atrial fib with a CVR and RVR. He also has sinus node dysfunction with HR's in the 40's while on beta blockers. His main complaint today is that he feels week and has little energy. No anginal symptoms. No CHF symptoms in terms of pulmonary congestion. Allergies  Allergen Reactions  . Polymyxin B-Trimethoprim Swelling    Eye drops made eyes swell  . Codeine Rash and Itching  . Guaifenesin & Derivatives     Other reaction(s): Hallucinations  . Statins Other (See Comments)    Muscle aches, Simvastatin,Atorvstatin,   . Avelox [Moxifloxacin] Other (See Comments)    Stomach cramps  . Guaifenesin Diarrhea  . Mucinex D [Pseudoephedrine-Guaifenesin Er] Nausea And Vomiting    Stomach cramps  . Polymyxin B Other (See Comments)    Swelling  . Pseudoephedrine     Stomach cramps  . Pseudoephedrine-Guaifenesin Nausea And Vomiting    Stomach cramps  . Rosuvastatin Calcium Other (See Comments)    Muscle aches  . Simvastatin Other (See Comments)    Muscle aches and cramps  . Tapentadol Other (See Comments)    unknown  . Vioxx [Rofecoxib] Other (See Comments)    Stomach cramping   . Ciprofloxacin Itching, Rash and Nausea Only  . Codeine Phosphate Itching and Rash     Current Outpatient Prescriptions  Medication Sig Dispense Refill  . clobetasol (TEMOVATE) 0.05 % external solution APPLY 1 APPLICATION TOPICALLY TWICE DAILY 100 mL 3  . clobetasol (TEMOVATE) 0.05 % external solution APPLY 1 APPLICATION TOPICALLY TWICE DAILY 50 mL 1  . ELIQUIS 5 MG TABS tablet Take 1 tablet by mouth two  times daily 180 tablet 3  . ezetimibe (ZETIA) 10 MG tablet TAKE 1 TABLET BY MOUTH  DAILY WITH  SUPPER 90 tablet 1  . fluconazole (DIFLUCAN) 100 MG tablet Take 1 tablet (100 mg total) by mouth once a week. Take one by mouth 4 tablet 5  . latanoprost (XALATAN) 0.005 % ophthalmic solution Place 1 drop into the left eye at bedtime.     Marland Kitchen lubiprostone (AMITIZA) 8 MCG capsule Take 1-2 tablets by mouth a day    . magic mouthwash w/lidocaine SOLN Take 5 mLs by mouth 4 (four) times daily as needed for mouth pain. 5 mL 1  . metoprolol (LOPRESSOR) 50 MG tablet Take 1 tablet (50 mg total) by mouth 2 (two) times daily. 60 tablet 11  . Multiple Vitamins-Minerals (ICAPS MV) TABS Take 1 tablet by mouth 2 (two) times daily.     Marland Kitchen omeprazole (PRILOSEC OTC) 20 MG tablet Take 20 mg by mouth daily.    . propranolol (INDERAL) 10 MG tablet TAKE 1 TABLET BY MOUTH DAILY AS NEEDED AS DIRECTED BY MD 90 tablet 0  . Ranibizumab (LUCENTIS IO) Inject into the eye as directed. (Macular Degeneration)     No current facility-administered medications for this visit.      Past Medical History:  Diagnosis Date  . Anemia   . Atrial flutter ([place]) 03/31/2012   converted spontaneously to sinus  . CAD (coronary artery disease)    reportedly moderate CAD; managed medically  . COPD (chronic obstructive pulmonary disease) ([place])   .  Eczema 08/03/2014  . GERD (gastroesophageal reflux disease)   . H/O: rheumatic fever   . Heart murmur   . Heart murmur   . High risk medication use    on amiodarone since 03/31/2012  . History of colonoscopy   . Hyperlipidemia   . Hypertension   . Kidney stones   . Macular degeneration   . Osteoporosis 05/31/2016    ROS:   All systems reviewed and negative except as noted in the HPI.   Past Surgical History:  Procedure Laterality Date  . APPENDECTOMY    . CARDIAC CATHETERIZATION  04/22/2011   moderate left main and RCA stenosis not significant by FFR and IVUS on medical therapy  . CERVICAL DISCECTOMY     C5,6,7 disc fused  with plate and 5 1" screws  . CHOLECYSTECTOMY    . FOOT  SURGERY     right calcification removed from top of foot  . knee cartiledge Right    right knee  . MOUTH SURGERY     periodontal surgery, bridges, splint in front,      Family History  Problem Relation Age of Onset  . Heart disease Mother   . Diabetes Mother   . Cirrhosis Mother   . Emphysema Mother     never smoked but 2nd hand through her spouse  . Hypertension Mother   . Macular degeneration Mother   . Heart disease Father   . Cancer Father     prostate  . Hyperlipidemia Father   . Hypertension Father   . Varicose Veins Father   . Heart attack Father   . Peripheral vascular disease Father   . Heart disease Sister   . Arthritis Sister   . Hyperlipidemia Sister   . Obesity Sister   . Macular degeneration Sister   . Heart disease Brother     5 stents  . Hyperlipidemia Brother   . Macular degeneration Maternal Grandfather   . Cirrhosis Sister   . Obesity Sister   . Arthritis Sister   . Heart disease Sister   . Obesity Sister   . Liver disease    . Prostate cancer    . Coronary artery disease       Social History   Social History  . Marital status: Married    Spouse name: N/A  . Number of children: 0  . Years of education: N/A   Occupational History  . retired Retired   Social History Main Topics  . Smoking status: Former Smoker    Packs/day: 0.50    Years: 50.00    Types: Cigarettes    Quit date: 05/01/2013  . Smokeless tobacco: Former Systems developer     Comment: using e-Cig//ldc  . Alcohol use No  . Drug use: No  . Sexual activity: Yes     Comment: lives with wife, no dietary restrictions   Other Topics Concern  . Not on file   Social History Narrative  . No narrative on file     BP 110/60   Pulse 66   Ht '5\' 5"'$  (1.651 m)   Wt 176 lb 9.6 oz (80.1 kg)   BMI 29.39 kg/m   Physical Exam:  Well appearing 71 yo man, NAD HEENT: Unremarkable Neck:  6 cm JVD, no thyromegally Lymphatics:  No adenopathy Back:  No CVA tenderness Lungs:  Clear with  no wheezes HEART:  Regular rate rhythm, no murmurs, no rubs, no clicks Abd:  soft, positive bowel sounds, no organomegally, no rebound,  no guarding Ext:  2 plus pulses, no edema, no cyanosis, no clubbing Skin:  No rashes no nodules Neuro:  CN II through XII intact, motor grossly intact  EKG - nsr   Cardiac monitor - NSR with periods of sinus brady, atrial fib with a CVR and RVR  Assess/Plan: 1. Atrial fib - he is in NSR. His symptoms have worsened. I am sure if he is having atrial fib. Because of his h/o CAD, we will start sotalol. He has preserved LV function and I will allow him to start this med as an outpatient and return in 2 days for an ECG. Will ask the patient to undergo a 2D echo.  2. HTN - his blood pressure is well controlled.  3. Dyslipidemia - he will continue Zetia. 4. Coags - he will continue Eliquis for now.  Mikle Bosworth.D.

## 2017-01-12 ENCOUNTER — Telehealth: Payer: Self-pay | Admitting: Family Medicine

## 2017-01-12 NOTE — Telephone Encounter (Signed)
He can have the prescription for 50 ml for eczema that should work

## 2017-01-12 NOTE — Telephone Encounter (Signed)
Received pa form. He states this was prescribed years ago for Psoriasis and Rosacea--- But neither are on his problem list.  Also went over the list of tried/failed not sure if you know of any more. He just needs this to be written for 50 ml/30 day supply---did not need the 100 ml.

## 2017-01-14 ENCOUNTER — Ambulatory Visit (INDEPENDENT_AMBULATORY_CARE_PROVIDER_SITE_OTHER): Payer: Medicare Other | Admitting: *Deleted

## 2017-01-14 ENCOUNTER — Ambulatory Visit (HOSPITAL_COMMUNITY): Payer: Medicare Other | Attending: Internal Medicine

## 2017-01-14 ENCOUNTER — Other Ambulatory Visit: Payer: Self-pay

## 2017-01-14 DIAGNOSIS — I5032 Chronic diastolic (congestive) heart failure: Secondary | ICD-10-CM

## 2017-01-14 DIAGNOSIS — I480 Paroxysmal atrial fibrillation: Secondary | ICD-10-CM | POA: Insufficient documentation

## 2017-01-14 DIAGNOSIS — I4891 Unspecified atrial fibrillation: Secondary | ICD-10-CM | POA: Diagnosis not present

## 2017-01-14 DIAGNOSIS — I517 Cardiomegaly: Secondary | ICD-10-CM | POA: Insufficient documentation

## 2017-01-14 DIAGNOSIS — I340 Nonrheumatic mitral (valve) insufficiency: Secondary | ICD-10-CM | POA: Diagnosis not present

## 2017-01-14 HISTORY — DX: Chronic diastolic (congestive) heart failure: I50.32

## 2017-01-14 NOTE — Telephone Encounter (Signed)
PA  Was denied coverage. Patient has been contacted of denial. Patient agreed he may just have to pay out of pocket for this.

## 2017-01-14 NOTE — Patient Instructions (Signed)
1.) Reason for visit: ekg   2.) Name of MD requesting visit: Dr.Taylor  3)   Assessment and plan per MD: Dr Burt Knack reviewed will put in Dr. Tanna Furry  folder for [facility]

## 2017-01-14 NOTE — Telephone Encounter (Signed)
Completed form and faxed to [employer] for approval.

## 2017-01-18 ENCOUNTER — Ambulatory Visit (INDEPENDENT_AMBULATORY_CARE_PROVIDER_SITE_OTHER): Payer: Medicare Other | Admitting: *Deleted

## 2017-01-18 DIAGNOSIS — Z79899 Other long term (current) drug therapy: Secondary | ICD-10-CM | POA: Diagnosis not present

## 2017-01-18 DIAGNOSIS — H401121 Primary open-angle glaucoma, left eye, mild stage: Secondary | ICD-10-CM | POA: Diagnosis not present

## 2017-01-18 DIAGNOSIS — R001 Bradycardia, unspecified: Secondary | ICD-10-CM | POA: Diagnosis not present

## 2017-01-18 NOTE — Progress Notes (Signed)
REVIEWED WITH DR Caryl Comes  PER  DR Caryl Comes  HAVE PT HOLD  SOTALOL  UNTIL  OFFICE  CALLS WITH  RECOMMENDATIONS  FROM [name] BASED  ON HR  READINGS  FROM PT  .Adonis Housekeeper

## 2017-01-19 ENCOUNTER — Telehealth: Payer: Self-pay | Admitting: *Deleted

## 2017-01-19 DIAGNOSIS — L409 Psoriasis, unspecified: Secondary | ICD-10-CM | POA: Insufficient documentation

## 2017-01-19 NOTE — Telephone Encounter (Signed)
Spoke with Dr. Lovena Le regarding patient's 2 EKGs from 01/14/17 and 01/18/17.  Per documentation from nurse visit yesterday I reviewed EKGs and patient's HR with Dr. Lovena Le.   Dr. Lovena Le recommends continue sotalol 120 mg two times daily.  The patient was informed of this.  He and his wife have concerns about his bradycardia.  Patient reports that on the metoprolol when his HR was below 50 he would skip one dose.  This happened about every 3 - 4 days.   Pt wants to know if it is ok to do same with sotalol.   Per Dr. Lovena Le that will be fine.

## 2017-01-21 NOTE — Telephone Encounter (Signed)
Received note from patient with diagnosis to add for this medication. 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 Called [phone] to initiate appeal.  Was transferred 4 times and was on  The phone 30 minutes.  Had to hang up due to wait time and other job priorities waiting. Will attempt again.

## 2017-01-26 DIAGNOSIS — M4726 Other spondylosis with radiculopathy, lumbar region: Secondary | ICD-10-CM | POA: Diagnosis not present

## 2017-01-26 DIAGNOSIS — R030 Elevated blood-pressure reading, without diagnosis of hypertension: Secondary | ICD-10-CM | POA: Diagnosis not present

## 2017-01-26 DIAGNOSIS — M5136 Other intervertebral disc degeneration, lumbar region: Secondary | ICD-10-CM | POA: Diagnosis not present

## 2017-01-26 DIAGNOSIS — Z6830 Body mass index (BMI) 30.0-30.9, adult: Secondary | ICD-10-CM | POA: Diagnosis not present

## 2017-01-26 DIAGNOSIS — M5416 Radiculopathy, lumbar region: Secondary | ICD-10-CM | POA: Diagnosis not present

## 2017-01-26 DIAGNOSIS — M48062 Spinal stenosis, lumbar region with neurogenic claudication: Secondary | ICD-10-CM | POA: Diagnosis not present

## 2017-01-28 ENCOUNTER — Telehealth: Payer: Self-pay | Admitting: Internal Medicine

## 2017-01-28 DIAGNOSIS — H353110 Nonexudative age-related macular degeneration, right eye, stage unspecified: Secondary | ICD-10-CM | POA: Diagnosis not present

## 2017-01-28 DIAGNOSIS — H353221 Exudative age-related macular degeneration, left eye, with active choroidal neovascularization: Secondary | ICD-10-CM | POA: Diagnosis not present

## 2017-01-28 NOTE — Telephone Encounter (Signed)
Ryan [name] is calling in reference to the medication that Dr.Taylor has him on called Soltaol '120mg'$  . Its keeps the heart rate at 45-47 and he is tired . Wants to know if he can skip a pill ? Thank you

## 2017-01-28 NOTE — Telephone Encounter (Signed)
Spoke with pt and wife regarding pt being on Sotalol 120 mg twice a day. Pt states that his heart rate this week has been 45, 47, 50 beats/minute. And it makes him  feel tired. Pt wants to know if he can skip a dose like he did when he was taking Metoprolol medication when his HR below 50 beats/minute. On 01/19/17 phone note this question was asked to Dr. Lovena Le by Rodman Key RN . [name] said  that "will be fine' to skip a dose. Pt and wife verbalized understanding.

## 2017-02-08 DIAGNOSIS — H25813 Combined forms of age-related cataract, bilateral: Secondary | ICD-10-CM | POA: Diagnosis not present

## 2017-02-08 DIAGNOSIS — H401121 Primary open-angle glaucoma, left eye, mild stage: Secondary | ICD-10-CM | POA: Diagnosis not present

## 2017-02-09 ENCOUNTER — Encounter: Payer: Self-pay | Admitting: Internal Medicine

## 2017-02-09 ENCOUNTER — Other Ambulatory Visit: Payer: Self-pay | Admitting: Internal Medicine

## 2017-02-09 ENCOUNTER — Ambulatory Visit (INDEPENDENT_AMBULATORY_CARE_PROVIDER_SITE_OTHER): Payer: Medicare Other | Admitting: Internal Medicine

## 2017-02-09 VITALS — BP 142/74 | HR 45 | Ht 65.0 in | Wt 174.2 lb

## 2017-02-09 DIAGNOSIS — I4892 Unspecified atrial flutter: Secondary | ICD-10-CM

## 2017-02-09 MED ORDER — PROPAFENONE HCL 225 MG PO TABS: 225.0000 mg | ORAL_TABLET | ORAL | 3 refills | Status: DC | PRN

## 2017-02-09 MED ORDER — METOPROLOL TARTRATE 25 MG PO TABS: 12.5000 mg | ORAL_TABLET | Freq: Two times a day (BID) | ORAL | 3 refills | Status: DC

## 2017-02-09 NOTE — Patient Instructions (Addendum)
Medication Instructions:  Your physician has recommended you make the following change in your medication:   1) STOP sotalol  2) START metoprolol tartrate 12.5 mg twice daily  3) TAKE propafenone (rythmol) 225 mg  AS NEEDED (TAKE 1-2 tablets daily AS NEEDED for Afib)    Labwork: None ordered  Testing/Procedures: None ordered  Follow-Up: Your physician recommends that you schedule a follow-up appointment in: 3 months with Dr. Lovena Le    Any Other Special Instructions Will Be Listed Below (If Applicable).     If you need a refill on your cardiac medications before your next appointment, please call your pharmacy.

## 2017-02-09 NOTE — Progress Notes (Signed)
HPI [name] returns today for ongoing evaluation and management of atrial fib and palpitations. He is a 71 yo man with a h/o syncope a couple of years ago who was offered a loop recorder and refused. He carries a h/o atrial fib and palpitations and underwent cardiac monitoring where he was noted to have atrial fib with a CVR and RVR. He also has sinus node dysfunction with HR's in the 40's while on beta blockers. When I saw him last, he was having more atrial fib and we started him on sotalol. He has maintained NSR but he feels terribly and wants to stop this medication. The patient feels week and tired. Her has been bradycardic. Allergies  Allergen Reactions  . Polymyxin B-Trimethoprim Swelling    Eye drops made eyes swell  . Codeine Rash and Itching  . Guaifenesin & Derivatives     Other reaction(s): Hallucinations  . Statins Other (See Comments)    Muscle aches, Simvastatin,Atorvstatin,   . Avelox [Moxifloxacin] Other (See Comments)    Stomach cramps  . Guaifenesin Diarrhea  . Mucinex D [Pseudoephedrine-Guaifenesin Er] Nausea And Vomiting    Stomach cramps  . Polymyxin B Other (See Comments)    Swelling  . Pseudoephedrine     Stomach cramps  . Pseudoephedrine-Guaifenesin Nausea And Vomiting    Stomach cramps  . Rosuvastatin Calcium Other (See Comments)    Muscle aches  . Simvastatin Other (See Comments)    Muscle aches and cramps  . Tapentadol Other (See Comments)    unknown  . Vioxx [Rofecoxib] Other (See Comments)    Stomach cramping   . Ciprofloxacin Itching, Rash and Nausea Only  . Codeine Phosphate Itching and Rash     Current Outpatient Prescriptions  Medication Sig Dispense Refill  . clobetasol (TEMOVATE) 0.05 % external solution APPLY 1 APPLICATION TOPICALLY TWICE DAILY 100 mL 3  . clobetasol (TEMOVATE) 0.05 % external solution APPLY 1 APPLICATION TOPICALLY TWICE DAILY 100 mL 1  . ELIQUIS 5 MG TABS tablet Take 1 tablet by mouth two  times daily 180  tablet 3  . ezetimibe (ZETIA) 10 MG tablet TAKE 1 TABLET BY MOUTH  DAILY WITH SUPPER 90 tablet 1  . fluconazole (DIFLUCAN) 100 MG tablet Take 1 tablet (100 mg total) by mouth once a week. Take one by mouth 4 tablet 5  . latanoprost (XALATAN) 0.005 % ophthalmic solution Place 1 drop into the left eye at bedtime.     Marland Kitchen linaclotide (LINZESS) 72 MCG capsule Take 1 capsule by mouth daily.    . magic mouthwash w/lidocaine SOLN Take 5 mLs by mouth 4 (four) times daily as needed for mouth pain. 5 mL 1  . Multiple Vitamins-Minerals (ICAPS MV) TABS Take 1 tablet by mouth 2 (two) times daily.     Marland Kitchen omeprazole (PRILOSEC OTC) 20 MG tablet Take 20 mg by mouth daily.    . Ranibizumab (LUCENTIS IO) Inject into the eye as directed. (Macular Degeneration)    . sotalol (BETAPACE) 120 MG tablet Take 1 tablet (120 mg total) by mouth 2 (two) times daily. 60 tablet 11   No current facility-administered medications for this visit.      Past Medical History:  Diagnosis Date  . Anemia   . Atrial flutter ([place]) 03/31/2012   converted spontaneously to sinus  . CAD (coronary artery disease)    reportedly moderate CAD; managed medically  . COPD (chronic obstructive pulmonary disease) ([place])   . Eczema 08/03/2014  .  GERD (gastroesophageal reflux disease)   . H/O: rheumatic fever   . Heart murmur   . Heart murmur   . High risk medication use    on amiodarone since 03/31/2012  . History of colonoscopy   . Hyperlipidemia   . Hypertension   . Kidney stones   . Macular degeneration   . Osteoporosis 05/31/2016    ROS:   All systems reviewed and negative except as noted in the HPI.   Past Surgical History:  Procedure Laterality Date  . APPENDECTOMY    . CARDIAC CATHETERIZATION  04/22/2011   moderate left main and RCA stenosis not significant by FFR and IVUS on medical therapy  . CERVICAL DISCECTOMY     C5,6,7 disc fused  with plate and 5 1" screws  . CHOLECYSTECTOMY    . FOOT SURGERY     right calcification  removed from top of foot  . knee cartiledge Right    right knee  . MOUTH SURGERY     periodontal surgery, bridges, splint in front,      Family History  Problem Relation Age of Onset  . Heart disease Mother   . Diabetes Mother   . Cirrhosis Mother   . Emphysema Mother     never smoked but 2nd hand through her spouse  . Hypertension Mother   . Macular degeneration Mother   . Heart disease Father   . Cancer Father     prostate  . Hyperlipidemia Father   . Hypertension Father   . Varicose Veins Father   . Heart attack Father   . Peripheral vascular disease Father   . Heart disease Sister   . Arthritis Sister   . Hyperlipidemia Sister   . Obesity Sister   . Macular degeneration Sister   . Heart disease Brother     5 stents  . Hyperlipidemia Brother   . Macular degeneration Maternal Grandfather   . Cirrhosis Sister   . Obesity Sister   . Arthritis Sister   . Heart disease Sister   . Obesity Sister   . Liver disease    . Prostate cancer    . Coronary artery disease       Social History   Social History  . Marital status: Married    Spouse name: N/A  . Number of children: 0  . Years of education: N/A   Occupational History  . retired Retired   Social History Main Topics  . Smoking status: Former Smoker    Packs/day: 0.50    Years: 50.00    Types: Cigarettes    Quit date: 05/01/2013  . Smokeless tobacco: Former Systems developer     Comment: using e-Cig//ldc  . Alcohol use No  . Drug use: No  . Sexual activity: Yes     Comment: lives with wife, no dietary restrictions   Other Topics Concern  . Not on file   Social History Narrative  . No narrative on file     BP (!) 142/74   Pulse (!) 45   Ht '5\' 5"'$  (1.651 m)   Wt 174 lb 3.2 oz (79 kg)   SpO2 95%   BMI 28.99 kg/m   Physical Exam:  Well appearing 71 yo man, NAD HEENT: Unremarkable Neck:  6 cm JVD, no thyromegally Lymphatics:  No adenopathy Back:  No CVA tenderness Lungs:  Clear with no  wheezes HEART:  Regular rate rhythm, no murmurs, no rubs, no clicks Abd:  soft, positive bowel sounds, no organomegally,  no rebound, no guarding Ext:  2 plus pulses, no edema, no cyanosis, no clubbing Skin:  No rashes no nodules Neuro:  CN II through XII intact, motor grossly intact  EKG - sinus bradycardia   Assess/Plan: 1. Atrial fib - he is in NSR. His symptoms have worsened. I have asked him to stop his sotalol as he feels bad on this medication and switch to low dose metoprolol and as needed Propafenone.  2. HTN - his blood pressure is well controlled.  3. Dyslipidemia - he will continue Zetia. 4. Coags - he will continue Eliquis for now.  Mikle Bosworth.D.

## 2017-02-11 DIAGNOSIS — M4726 Other spondylosis with radiculopathy, lumbar region: Secondary | ICD-10-CM | POA: Diagnosis not present

## 2017-02-11 DIAGNOSIS — M48061 Spinal stenosis, lumbar region without neurogenic claudication: Secondary | ICD-10-CM | POA: Diagnosis not present

## 2017-02-11 DIAGNOSIS — M5136 Other intervertebral disc degeneration, lumbar region: Secondary | ICD-10-CM | POA: Diagnosis not present

## 2017-02-11 NOTE — Telephone Encounter (Signed)
propafenone (RYTHMOL) 225 MG tablet  Medication  Date: 02/09/2017 Department: [address] Office Ordering/Authorizing: Evans Lance, MD  Order Providers   Prescribing Provider Encounter Provider  Evans Lance, MD Evans Lance, MD  Medication Detail    Disp Refills Start End   propafenone [facility]) 225 MG tablet 30 tablet 3 02/09/2017    Sig - Route: Take 1 tablet (225 mg total) by mouth as needed (for Afib). Take 1-2 tablets daily AS NEEDED for Afib - Oral   E-Prescribing Status: Receipt confirmed by pharmacy (02/09/2017 10:49 AM EDT)   Pharmacy   [id number] - [place], [place]

## 2017-02-13 ENCOUNTER — Emergency Department (HOSPITAL_BASED_OUTPATIENT_CLINIC_OR_DEPARTMENT_OTHER)
Admission: EM | Admit: 2017-02-13 | Discharge: 2017-02-13 | Disposition: A | Discharge status: Home / Self Care | Payer: Medicare Other | Attending: Emergency Medicine | Admitting: Emergency Medicine

## 2017-02-13 ENCOUNTER — Encounter (HOSPITAL_BASED_OUTPATIENT_CLINIC_OR_DEPARTMENT_OTHER): Payer: Self-pay | Admitting: Emergency Medicine

## 2017-02-13 DIAGNOSIS — Y939 Activity, unspecified: Secondary | ICD-10-CM | POA: Insufficient documentation

## 2017-02-13 DIAGNOSIS — J449 Chronic obstructive pulmonary disease, unspecified: Secondary | ICD-10-CM | POA: Diagnosis not present

## 2017-02-13 DIAGNOSIS — S51812A Laceration without foreign body of left forearm, initial encounter: Secondary | ICD-10-CM | POA: Diagnosis not present

## 2017-02-13 DIAGNOSIS — I2510 Atherosclerotic heart disease of native coronary artery without angina pectoris: Secondary | ICD-10-CM | POA: Diagnosis not present

## 2017-02-13 DIAGNOSIS — I10 Essential (primary) hypertension: Secondary | ICD-10-CM | POA: Diagnosis not present

## 2017-02-13 DIAGNOSIS — Z79899 Other long term (current) drug therapy: Secondary | ICD-10-CM | POA: Insufficient documentation

## 2017-02-13 DIAGNOSIS — Z87891 Personal history of nicotine dependence: Secondary | ICD-10-CM | POA: Insufficient documentation

## 2017-02-13 DIAGNOSIS — S50811A Abrasion of right forearm, initial encounter: Secondary | ICD-10-CM | POA: Diagnosis not present

## 2017-02-13 DIAGNOSIS — Y999 Unspecified external cause status: Secondary | ICD-10-CM | POA: Insufficient documentation

## 2017-02-13 DIAGNOSIS — T148XXA Other injury of unspecified body region, initial encounter: Secondary | ICD-10-CM

## 2017-02-13 DIAGNOSIS — W548XXA Other contact with dog, initial encounter: Secondary | ICD-10-CM | POA: Insufficient documentation

## 2017-02-13 DIAGNOSIS — Y929 Unspecified place or not applicable: Secondary | ICD-10-CM | POA: Insufficient documentation

## 2017-02-13 MED ORDER — BACITRACIN ZINC 500 UNIT/GM EX OINT
TOPICAL_OINTMENT | Freq: Two times a day (BID) | CUTANEOUS | Status: DC
  Administered 2017-02-13: 09:00:00 via TOPICAL
  Filled 2017-02-13: qty 28.35

## 2017-02-13 NOTE — ED Provider Notes (Signed)
[facility] DEPT [facility] Provider Note   CSN: [id number] Arrival date & time: 02/13/17  0801     History   Chief Complaint Chief Complaint  Patient presents with  . Wound Check    HPI Courage Biglow is a 71 y.o. male.  HPI   71 year old male in a liquids presents with concern for dog scratch the right arm that occurred yesterday. Reports that his son's small dog had jumped on him, scratching his right arm.  There was initially a large amount of bleeding.  He irrigate it, cleaned it, placed wrap and was able to gain control of bleeding.  Is here today for evaluation of wound. Not sure of last tetanus. No other concerns, or other injuries.  Pain mild   Past Medical History:  Diagnosis Date  . Anemia   . Atrial flutter ([place]) 03/31/2012   converted spontaneously to sinus  . CAD (coronary artery disease)    reportedly moderate CAD; managed medically  . COPD (chronic obstructive pulmonary disease) ([place])   . Eczema 08/03/2014  . GERD (gastroesophageal reflux disease)   . H/O: rheumatic fever   . Heart murmur   . Heart murmur   . High risk medication use    on amiodarone since 03/31/2012  . History of colonoscopy   . Hyperlipidemia   . Hypertension   . Kidney stones   . Macular degeneration   . Osteoporosis 05/31/2016    Patient Active Problem List   Diagnosis Date Noted  . 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 Osteoporosis 05/31/2016  . Thrush 08/11/2015  . BPH (benign prostatic hyperplasia) 01/20/2015  . Erectile dysfunction 01/20/2015  . Rectal bleeding 01/20/2015  . Medicare annual wellness visit, subsequent 01/20/2015  . Eczema 08/03/2014  . Sleep apnea 03/01/2014  . Anticoagulation adequate with anticoagulant therapy 03/01/2014  . Pre-syncope 02/26/2014  . Abdominal pain 02/26/2014  . PAD (peripheral artery disease) ([place]) 02/26/2014  . Chronic constipation 03/06/2013  . Easy bruising 03/06/2013  . History of alcohol abuse 04/15/2012  . Atrial flutter with  rapid ventricular response ([place]) 03/31/2012  . CAD (coronary atherosclerotic disease) 05/08/2011  . 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 UNS ADVRS EFF UNS RX MEDICINAL&BIOLOGICAL SBSTNC 11/11/2009  . H/O tobacco use, presenting hazards to health 07/29/2009  . COPD GOLD I 07/29/2009  . Hyperlipidemia, mixed 06/20/2007  . DEGENERATION, MACULAR NOS 06/20/2007  . Essential hypertension 06/20/2007  . GERD 06/16/2007    Past Surgical History:  Procedure Laterality Date  . APPENDECTOMY    . CARDIAC CATHETERIZATION  04/22/2011   moderate left main and RCA stenosis not significant by FFR and IVUS on medical therapy  . CERVICAL DISCECTOMY     C5,6,7 disc fused  with plate and 5 1" screws  . CHOLECYSTECTOMY    . FOOT SURGERY     right calcification removed from top of foot  . knee cartiledge Right    right knee  . MOUTH SURGERY     periodontal surgery, bridges, splint in front,        Home Medications    Prior to Admission medications   Medication Sig Start Date End Date Taking? Authorizing Provider  clobetasol (TEMOVATE) 0.05 % external solution APPLY 1 APPLICATION TOPICALLY TWICE DAILY 11/20/16   [name], MD  clobetasol (TEMOVATE) 0.05 % external solution APPLY 1 APPLICATION TOPICALLY TWICE DAILY 01/11/17   [name], MD  ELIQUIS 5 MG TABS tablet Take 1 tablet  by mouth two  times daily 06/05/16   Evans Lance, MD  ezetimibe (ZETIA) 10 MG tablet TAKE 1 TABLET BY MOUTH  DAILY WITH SUPPER 12/14/16   [name], MD  fluconazole (DIFLUCAN) 100 MG tablet Take 1 tablet (100 mg total) by mouth once a week. Take one by mouth 05/19/16   [name], MD  latanoprost (XALATAN) 0.005 % ophthalmic solution Place 1 drop into the left eye at bedtime.     [provider]  linaclotide (LINZESS) 72 MCG capsule Take 1 capsule by mouth daily. 02/02/17   [provider]  magic mouthwash w/lidocaine SOLN Take 5  mLs by mouth 4 (four) times daily as needed for mouth pain. 07/29/15   [name], MD  metoprolol tartrate (LOPRESSOR) 25 MG tablet Take 0.5 tablets (12.5 mg total) by mouth 2 (two) times daily. 02/09/17 05/10/17  Evans Lance, MD  Multiple Vitamins-Minerals (ICAPS MV) TABS Take 1 tablet by mouth 2 (two) times daily.     [provider]  omeprazole (PRILOSEC OTC) 20 MG tablet Take 20 mg by mouth daily.    [provider]  propafenone (RYTHMOL) 225 MG tablet Take 1 tablet (225 mg total) by mouth as needed (for Afib). Take 1-2 tablets daily AS NEEDED for Afib 02/09/17   Evans Lance, MD  Ranibizumab (LUCENTIS IO) Inject into the eye as directed. (Macular Degeneration)    [provider]    Family History Family History  Problem Relation Age of Onset  . Heart disease Mother   . Diabetes Mother   . Cirrhosis Mother   . Emphysema Mother        never smoked but 2nd hand through her spouse  . Hypertension Mother   . Macular degeneration Mother   . Heart disease Father   . Cancer Father        prostate  . Hyperlipidemia Father   . Hypertension Father   . Varicose Veins Father   . Heart attack Father   . Peripheral vascular disease Father   . Heart disease Sister   . Arthritis Sister   . Hyperlipidemia Sister   . Obesity Sister   . Macular degeneration Sister   . Heart disease Brother        5 stents  . Hyperlipidemia Brother   . Macular degeneration Maternal Grandfather   . Cirrhosis Sister   . Obesity Sister   . Arthritis Sister   . Heart disease Sister   . Obesity Sister   . Liver disease Unknown   . Prostate cancer Unknown   . Coronary artery disease Unknown     Social History Social History  Substance Use Topics  . Smoking status: Former Smoker    Packs/day: 0.50    Years: 50.00    Types: Cigarettes    Quit date: 05/01/2013  . Smokeless tobacco: Former Systems developer     Comment: using e-Cig//ldc  . Alcohol use No     Allergies   Polymyxin  b-trimethoprim; Codeine; Guaifenesin & derivatives; Statins; Avelox [moxifloxacin]; Guaifenesin; Mucinex d [pseudoephedrine-guaifenesin er]; Polymyxin b; Pseudoephedrine; Pseudoephedrine-guaifenesin; Rosuvastatin calcium; Simvastatin; Tapentadol; Vioxx [rofecoxib]; Ciprofloxacin; and Codeine phosphate   Review of Systems Review of Systems  Constitutional: Negative for fever.  HENT: Negative for sore throat.   Eyes: Negative for visual disturbance.  Respiratory: Negative for shortness of breath.   Cardiovascular: Negative for chest pain.  Gastrointestinal: Negative for abdominal pain.  Genitourinary: Negative for difficulty urinating.  Musculoskeletal: Negative  for back pain and neck stiffness.  Skin: Positive for wound. Negative for rash.  Neurological: Negative for syncope and headaches.     Physical Exam Updated Vital Signs BP (!) 168/67   Pulse 98   Temp 97.9 F (36.6 C) (Oral)   Resp 18   Ht '5\' 5"'$  (1.651 m)   Wt 172 lb (78 kg)   SpO2 99%   BMI 28.62 kg/m   Physical Exam  Constitutional: He is oriented to person, place, and time. He appears well-developed and well-nourished. No distress.  HENT:  Head: Normocephalic and atraumatic.  Eyes: Conjunctivae and EOM are normal.  Neck: Normal range of motion.  Cardiovascular: Normal rate, regular rhythm and intact distal pulses.   Pulmonary/Chest: Effort normal and breath sounds normal. No respiratory distress. He has no wheezes. He has no rales.  Musculoskeletal: He exhibits no edema.  Neurological: He is alert and oriented to person, place, and time.  Skin: Skin is warm and dry. Abrasion (skin tear) and ecchymosis noted. He is not diaphoretic.  Linear abrasion right forearm, 1cm areas of open skin tears, no surrounding erythema  Nursing note and vitals reviewed.    ED Treatments / Results  Labs (all labs ordered are listed, but only abnormal results are displayed) Labs Reviewed - No data to display  EKG  EKG  Interpretation None       Radiology No results found.  Procedures Procedures (including critical care time)  Medications Ordered in ED Medications - No data to display   Initial Impression / Assessment and Plan / ED Course  I have reviewed the triage vital signs and the nursing notes.  Pertinent labs & imaging results that were available during my care of the patient were reviewed by me and considered in my medical decision making (see chart for details).     71 year old male in a liquids presents with concern for dog scratch the right arm that occurred yesterday. Last tetanus in system was 05/2012.  No sign of infection at this time and will not initiate prophylactic abx given scratch wound.  Wound appropriate for local supportive care, bacitracin.  Discussed symptoms to look for, reasons to return. Patient discharged in stable condition with understanding of reasons to return.   Final Clinical Impressions(s) / ED Diagnoses   Final diagnoses:  Dog scratch  Abrasion    New Prescriptions Discharge Medication List as of 02/13/2017  8:57 AM       Gareth Morgan, MD 02/14/17 [phone]

## 2017-02-13 NOTE — ED Triage Notes (Signed)
Dog scratched right arm

## 2017-03-12 ENCOUNTER — Telehealth: Payer: Self-pay | Admitting: Family Medicine

## 2017-03-12 NOTE — Telephone Encounter (Signed)
Last AWV was 05/19/16, so he can be scheduled anytime after 05/19/17.  He may wish to schedule AWV w/ health coach on the same day as his next appt w/ Dr. Charlett Blake.

## 2017-03-12 NOTE — Telephone Encounter (Signed)
Pt wife called concerned we canceled 03/15/17 Medicare Wellness. When should pt be rescheduled? Wife is coming for appt for her 03/15/17 and will fu on this question then.

## 2017-03-15 ENCOUNTER — Ambulatory Visit: Payer: Medicare Other | Admitting: *Deleted

## 2017-03-16 ENCOUNTER — Encounter: Payer: Self-pay | Admitting: Family Medicine

## 2017-03-17 ENCOUNTER — Telehealth: Payer: Self-pay | Admitting: Family Medicine

## 2017-03-17 ENCOUNTER — Encounter: Payer: Self-pay | Admitting: Family Medicine

## 2017-03-17 NOTE — Telephone Encounter (Signed)
Ebony Hail or [place], will you please reach to this patient to explain AWV? Thanks!

## 2017-03-17 NOTE — Telephone Encounter (Signed)
error:[id number] ° °

## 2017-03-18 NOTE — Telephone Encounter (Signed)
Spoke to pt. He has agreed to have AWV with Hoyle Sauer on 06/21/17 after visit with Dr. Charlett Blake. Thanks!!

## 2017-03-18 NOTE — Telephone Encounter (Signed)
Left message for pt to call Ebony Hail back directly at [phone]

## 2017-03-19 ENCOUNTER — Other Ambulatory Visit: Payer: Self-pay | Admitting: Family Medicine

## 2017-03-19 MED ORDER — CLOBETASOL PROPIONATE 0.05 % EX SOLN: CUTANEOUS | 3 refills | Status: DC

## 2017-03-27 ENCOUNTER — Encounter (HOSPITAL_BASED_OUTPATIENT_CLINIC_OR_DEPARTMENT_OTHER): Payer: Self-pay | Admitting: Emergency Medicine

## 2017-03-27 ENCOUNTER — Emergency Department (HOSPITAL_BASED_OUTPATIENT_CLINIC_OR_DEPARTMENT_OTHER): Payer: Medicare Other

## 2017-03-27 ENCOUNTER — Emergency Department (HOSPITAL_BASED_OUTPATIENT_CLINIC_OR_DEPARTMENT_OTHER)
Admission: EM | Admit: 2017-03-27 | Discharge: 2017-03-27 | Disposition: A | Discharge status: Home / Self Care | Payer: Medicare Other | Attending: Emergency Medicine | Admitting: Emergency Medicine

## 2017-03-27 DIAGNOSIS — Z7901 Long term (current) use of anticoagulants: Secondary | ICD-10-CM | POA: Insufficient documentation

## 2017-03-27 DIAGNOSIS — Z8679 Personal history of other diseases of the circulatory system: Secondary | ICD-10-CM | POA: Diagnosis not present

## 2017-03-27 DIAGNOSIS — I4891 Unspecified atrial fibrillation: Secondary | ICD-10-CM | POA: Diagnosis not present

## 2017-03-27 DIAGNOSIS — I2510 Atherosclerotic heart disease of native coronary artery without angina pectoris: Secondary | ICD-10-CM | POA: Insufficient documentation

## 2017-03-27 DIAGNOSIS — Z87891 Personal history of nicotine dependence: Secondary | ICD-10-CM | POA: Insufficient documentation

## 2017-03-27 DIAGNOSIS — Z79899 Other long term (current) drug therapy: Secondary | ICD-10-CM | POA: Diagnosis not present

## 2017-03-27 DIAGNOSIS — J449 Chronic obstructive pulmonary disease, unspecified: Secondary | ICD-10-CM | POA: Diagnosis not present

## 2017-03-27 DIAGNOSIS — R079 Chest pain, unspecified: Secondary | ICD-10-CM | POA: Diagnosis not present

## 2017-03-27 LAB — BASIC METABOLIC PANEL
Anion gap: 10 (ref 5–15)
BUN: 13 mg/dL (ref 6–20)
CO2: 23 mmol/L (ref 22–32)
Calcium: 9.4 mg/dL (ref 8.9–10.3)
Chloride: 105 mmol/L (ref 101–111)
Creatinine, Ser: 0.91 mg/dL (ref 0.61–1.24)
GFR calc Af Amer: >60 mL/min (ref >60)
GFR calc non Af Amer: >60 mL/min (ref >60)
Glucose, Bld: 126 mg/dL — ABNORMAL HIGH (ref 65–99)
Potassium: 3.6 mmol/L (ref 3.5–5.1)
Sodium: 138 mmol/L (ref 135–145)

## 2017-03-27 LAB — CBC
HCT: 41 % (ref 39.0–52.0)
Hemoglobin: 14.8 g/dL (ref 13.0–17.0)
MCH: 31.5 pg (ref 26.0–34.0)
MCHC: 36.1 g/dL — ABNORMAL HIGH (ref 30.0–36.0)
MCV: 87.2 fL (ref 78.0–100.0)
Platelets: 225 10*3/uL (ref 150–400)
RBC: 4.7 MIL/uL (ref 4.22–5.81)
RDW: 13.2 % (ref 11.5–15.5)
WBC: 9 10*3/uL (ref 4.0–10.5)

## 2017-03-27 LAB — TROPONIN I: Troponin I: <0.03 ng/mL (ref <0.03)

## 2017-03-27 MED ORDER — SODIUM CHLORIDE 0.9 % IV BOLUS (SEPSIS)
500.0000 mL | Freq: Once | INTRAVENOUS | Status: AC
  Administered 2017-03-27: 500 mL via INTRAVENOUS

## 2017-03-27 MED ORDER — ETOMIDATE 2 MG/ML IV SOLN
5.0000 mg | Freq: Once | INTRAVENOUS | Status: DC
  Filled 2017-03-27: qty 10

## 2017-03-27 NOTE — ED Notes (Signed)
Pt denies any chest pain if he is lying down with HOB at 30 degrees.  He started that is fells like choking when he sits up (he is holding his mid chest just below is throat).

## 2017-03-27 NOTE — ED Notes (Signed)
Consent obtained for Cardioversion and Sedation by Dr. Alvino Chapel.

## 2017-03-27 NOTE — ED Notes (Signed)
Pt has difficulty seeing on his left eye d/t macular degenerative disease.

## 2017-03-27 NOTE — ED Triage Notes (Signed)
Hx of afib, pt reports chest pain and tachycardia since 1:00

## 2017-03-27 NOTE — ED Provider Notes (Signed)
[facility] DEPT [facility] Provider Note   CSN: [id number] Arrival date & time: 03/27/17  1424 By signing my name below, I, Dyke Brackett, attest that this documentation has been prepared under the direction and in the presence of Davonna Belling, MD . Electronically Signed: Dyke Brackett, Scribe. 03/27/2017. 3:31 PM.   History   Chief Complaint Chief Complaint  Patient presents with  . Chest Pain    HPI Renato Spellman is a 71 y.o. male with a history of A-fib, CAD, and COPD who presents to the Emergency Department complaining of sudden onset, constant palpations which began at 1 this pm. Per pt, he checked his pulse rate at home which ranged from 110-150. Pt reports associated dizziness, lightheadedness, and chest pain. He has taken 2 Rythmol at 1:45 with no significant relief. Per pt, he has never taken this medication before. Pt states this feels similar to prior instances of atrial fibrillation.Prior to today, he was last in A-fib on 11/18/16. He has never been cardioverted. Pt ate lunch today at 12 pm. He has been sedated in the past with no complications. He has been compliant with his Eliquis. He denies any recent fever or chills and has no other acute complaints at this time.    The history is provided by the patient. No language interpreter was used.   Past Medical History:  Diagnosis Date  . Anemia   . Atrial flutter ([place]) 03/31/2012   converted spontaneously to sinus  . CAD (coronary artery disease)    reportedly moderate CAD; managed medically  . COPD (chronic obstructive pulmonary disease) ([place])   . Eczema 08/03/2014  . GERD (gastroesophageal reflux disease)   . H/O: rheumatic fever   . Heart murmur   . Heart murmur   . High risk medication use    on amiodarone since 03/31/2012  . History of colonoscopy   . Hyperlipidemia   . Hypertension   . Kidney stones   . Macular degeneration   . Osteoporosis 05/31/2016    Patient Active Problem List   Diagnosis Date Noted   . 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 Osteoporosis 05/31/2016  . Thrush 08/11/2015  . BPH (benign prostatic hyperplasia) 01/20/2015  . Erectile dysfunction 01/20/2015  . Rectal bleeding 01/20/2015  . Medicare annual wellness visit, subsequent 01/20/2015  . Eczema 08/03/2014  . Sleep apnea 03/01/2014  . Anticoagulation adequate with anticoagulant therapy 03/01/2014  . Pre-syncope 02/26/2014  . Abdominal pain 02/26/2014  . PAD (peripheral artery disease) ([place]) 02/26/2014  . Chronic constipation 03/06/2013  . Easy bruising 03/06/2013  . History of alcohol abuse 04/15/2012  . Atrial flutter with rapid ventricular response ([place]) 03/31/2012  . CAD (coronary atherosclerotic disease) 05/08/2011  . 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 UNS ADVRS EFF UNS RX MEDICINAL&BIOLOGICAL SBSTNC 11/11/2009  . H/O tobacco use, presenting hazards to health 07/29/2009  . COPD GOLD I 07/29/2009  . Hyperlipidemia, mixed 06/20/2007  . DEGENERATION, MACULAR NOS 06/20/2007  . Essential hypertension 06/20/2007  . GERD 06/16/2007    Past Surgical History:  Procedure Laterality Date  . APPENDECTOMY    . CARDIAC CATHETERIZATION  04/22/2011   moderate left main and RCA stenosis not significant by FFR and IVUS on medical therapy  . CERVICAL DISCECTOMY     C5,6,7 disc fused  with plate and 5 1" screws  . CHOLECYSTECTOMY    . FOOT SURGERY     right calcification removed from top of foot  .  knee cartiledge Right    right knee  . MOUTH SURGERY     periodontal surgery, bridges, splint in front,        Home Medications    Prior to Admission medications   Medication Sig Start Date End Date Taking? Authorizing Provider  clobetasol (TEMOVATE) 0.05 % external solution APPLY 1 APPLICATION TOPICALLY TWICE DAILY 11/20/16   [name], MD  clobetasol (TEMOVATE) 0.05 % external solution APPLY 1 APPLICATION TOPICALLY TWICE DAILY 03/19/17   [name],  MD  ELIQUIS 5 MG TABS tablet Take 1 tablet by mouth two  times daily 06/05/16   Evans Lance, MD  ezetimibe (ZETIA) 10 MG tablet TAKE 1 TABLET BY MOUTH  DAILY WITH SUPPER 12/14/16   [name], MD  fluconazole (DIFLUCAN) 100 MG tablet Take 1 tablet (100 mg total) by mouth once a week. Take one by mouth 05/19/16   [name], MD  latanoprost (XALATAN) 0.005 % ophthalmic solution Place 1 drop into the left eye at bedtime.     [provider]  linaclotide (LINZESS) 72 MCG capsule Take 1 capsule by mouth daily. 02/02/17   [provider]  magic mouthwash w/lidocaine SOLN Take 5 mLs by mouth 4 (four) times daily as needed for mouth pain. 07/29/15   [name], MD  metoprolol tartrate (LOPRESSOR) 25 MG tablet Take 0.5 tablets (12.5 mg total) by mouth 2 (two) times daily. 02/09/17 05/10/17  Evans Lance, MD  Multiple Vitamins-Minerals (ICAPS MV) TABS Take 1 tablet by mouth 2 (two) times daily.     [provider]  omeprazole (PRILOSEC OTC) 20 MG tablet Take 20 mg by mouth daily.    [provider]  propafenone (RYTHMOL) 225 MG tablet Take 1 tablet (225 mg total) by mouth as needed (for Afib). Take 1-2 tablets daily AS NEEDED for Afib 02/09/17   Evans Lance, MD  Ranibizumab (LUCENTIS IO) Inject into the eye as directed. (Macular Degeneration)    [provider]    Family History Family History  Problem Relation Age of Onset  . Heart disease Mother   . Diabetes Mother   . Cirrhosis Mother   . Emphysema Mother        never smoked but 2nd hand through her spouse  . Hypertension Mother   . Macular degeneration Mother   . Heart disease Father   . Cancer Father        prostate  . Hyperlipidemia Father   . Hypertension Father   . Varicose Veins Father   . Heart attack Father   . Peripheral vascular disease Father   . Heart disease Sister   . Arthritis Sister   . Hyperlipidemia Sister   . Obesity Sister   . Macular degeneration Sister   .  Heart disease Brother        5 stents  . Hyperlipidemia Brother   . Macular degeneration Maternal Grandfather   . Cirrhosis Sister   . Obesity Sister   . Arthritis Sister   . Heart disease Sister   . Obesity Sister   . Liver disease Unknown   . Prostate cancer Unknown   . Coronary artery disease Unknown     Social History Social History  Substance Use Topics  . Smoking status: Former Smoker    Packs/day: 0.50    Years: 50.00    Types: Cigarettes    Quit date: 05/01/2013  . Smokeless tobacco: Former Systems developer  Comment: using e-Cig//ldc  . Alcohol use No     Allergies   Polymyxin b-trimethoprim; Codeine; Guaifenesin & derivatives; Statins; Avelox [moxifloxacin]; Guaifenesin; Mucinex d [pseudoephedrine-guaifenesin er]; Polymyxin b; Pseudoephedrine; Pseudoephedrine-guaifenesin; Rosuvastatin calcium; Simvastatin; Tapentadol; Vioxx [rofecoxib]; Ciprofloxacin; and Codeine phosphate   Review of Systems Review of Systems  Constitutional: Negative for chills and fever.  Cardiovascular: Positive for chest pain and palpitations.  Neurological: Positive for dizziness and light-headedness.   Physical Exam Updated Vital Signs BP (!) 142/102 (BP Location: Left Arm)   Pulse (!) 116   Temp 97.9 F (36.6 C) (Oral)   Resp 16   SpO2 99%   Physical Exam  Constitutional: He is oriented to person, place, and time. He appears well-developed and well-nourished. No distress.  HENT:  Head: Normocephalic and atraumatic.  Eyes: Conjunctivae are normal.  Cardiovascular: An irregular rhythm present. Tachycardia present.   Pulmonary/Chest: Effort normal. No respiratory distress. He has no wheezes. He has no rales.  Abdominal: He exhibits no distension.  Musculoskeletal: He exhibits edema.  Mild peripheral edema   Neurological: He is alert and oriented to person, place, and time.  Skin: Skin is warm and dry.  Nursing note and vitals reviewed.   ED Treatments / Results  DIAGNOSTIC  STUDIES:  Oxygen Saturation is 99% on RA, normal by my interpretation.    COORDINATION OF CARE:  3:15 PM Discussed treatment plan with pt at bedside and pt agreed to plan.   Labs (all labs ordered are listed, but only abnormal results are displayed) Labs Reviewed  BASIC METABOLIC PANEL - Abnormal; Notable for the following:       Result Value   Glucose, Bld 126 (*)    All other components within normal limits  CBC - Abnormal; Notable for the following:    MCHC 36.1 (*)    All other components within normal limits  TROPONIN I    EKG  EKG Interpretation  Date/Time:  Saturday March 27 2017 14:32:46 EDT Ventricular Rate:  120 PR Interval:    QRS Duration: 90 QT Interval:  292 QTC Calculation: 412 R Axis:   40 Text Interpretation:  Atrial fibrillation with rapid ventricular response Nonspecific ST abnormality Abnormal ECG Confirmed by Alvino Chapel  MD, Roniqua Kintz [phone]) on 03/27/2017 3:03:49 PM       Radiology Dg Chest 2 View  Result Date: 03/27/2017 CLINICAL DATA:  Atrial fibrillation, midsternal chest pain. EXAM: CHEST  2 VIEW COMPARISON:  Chest x-ray dated 03/19/2016. FINDINGS: Heart size and mediastinal contours are stable. Coarse interstitial lung markings bilaterally suggesting some degree of chronic interstitial lung disease. Suspect additional chronic bronchitic changes centrally. No confluent opacity to suggest a developing pneumonia. No pleural effusion or pneumothorax seen. Osseous structures about the chest are unremarkable. IMPRESSION: 1. No acute findings.  No evidence of pneumonia or pulmonary edema. 2. Evidence of chronic interstitial lung disease and probable chronic bronchitic changes. Electronically Signed   By: Franki Cabot M.D.   On: 03/27/2017 16:07    Procedures Procedures (including critical care time)  Medications Ordered in ED Medications  sodium chloride 0.9 % bolus 500 mL (0 mLs Intravenous Stopped 03/27/17 1635)     Initial Impression / Assessment  and Plan / ED Course  I have reviewed the triage vital signs and the nursing notes.  Pertinent labs & imaging results that were available during my care of the patient were reviewed by me and considered in my medical decision making (see chart for details).     Patient came  in for A. fib with RVR. History of paroxysmal A. fib that had previously been permanent A. fib. Discussed with Dr. Liliane Channel. Labs reassuring. Had planned for cardioversion however patient spontaneously converted likely due to the Rythmol that he taken. Rate controlled but still mild tachycardia with sinus rhythm. Discharge home to follow-up with Dr. Lovena Le.  Final Clinical Impressions(s) / ED Diagnoses   Final diagnoses:  Atrial fibrillation with rapid ventricular response [facility])    New Prescriptions Discharge Medication List as of 03/27/2017  7:04 PM       Davonna Belling, MD 03/27/17 2326

## 2017-03-27 NOTE — ED Notes (Signed)
ED Provider at bedside. 

## 2017-04-15 DIAGNOSIS — H35372 Puckering of macula, left eye: Secondary | ICD-10-CM | POA: Diagnosis not present

## 2017-04-15 DIAGNOSIS — H353221 Exudative age-related macular degeneration, left eye, with active choroidal neovascularization: Secondary | ICD-10-CM | POA: Diagnosis not present

## 2017-04-15 DIAGNOSIS — H35361 Drusen (degenerative) of macula, right eye: Secondary | ICD-10-CM | POA: Diagnosis not present

## 2017-05-11 ENCOUNTER — Telehealth: Payer: Self-pay | Admitting: Family Medicine

## 2017-05-11 NOTE — Telephone Encounter (Signed)
Called pt to r/s AWV. Pt scheduled for 06/21/17 at 8am. Pt needs to be placed on Angel's schedule. Lvm for pt to call office to r/s wellness appt.

## 2017-05-25 ENCOUNTER — Ambulatory Visit (INDEPENDENT_AMBULATORY_CARE_PROVIDER_SITE_OTHER): Payer: Medicare Other | Admitting: Internal Medicine

## 2017-05-25 ENCOUNTER — Encounter: Payer: Self-pay | Admitting: Internal Medicine

## 2017-05-25 VITALS — BP 132/78 | HR 52 | Ht 65.0 in | Wt 178.6 lb

## 2017-05-25 DIAGNOSIS — R002 Palpitations: Secondary | ICD-10-CM | POA: Diagnosis not present

## 2017-05-25 DIAGNOSIS — I4891 Unspecified atrial fibrillation: Secondary | ICD-10-CM | POA: Diagnosis not present

## 2017-05-25 NOTE — Addendum Note (Signed)
Addended by: Evans Lance on: 05/25/2017 09:52 AM   Modules accepted: Level of Service

## 2017-05-25 NOTE — Patient Instructions (Addendum)
Medication Instructions:  Your physician has recommended you make the following change in your medication:  1.  Take Rhythmol (propafenone) 225 mg by mouth twice a day. 2.  Continue metoprolol as ordered.     Labwork: None ordered.   Testing/Procedures: Your physician has requested that you have en exercise stress myoview. For further information please visit [email or web address]. Please follow instruction sheet, as given.  This test will be scheduled within the next 2-3 weeks.     Follow-Up: Your physician wants you to follow-up in: 4 months with Dr. Lovena Le.   You will receive a reminder letter in the mail two months in advance. If you don't receive a letter, please call our office to schedule the follow-up appointment.   Any Other Special Instructions Will Be Listed Below (If Applicable).  Please reduce your salt intake.   If you need a refill on your cardiac medications before your next appointment, please call your pharmacy.

## 2017-05-25 NOTE — Progress Notes (Signed)
HPI Mr. Bognar returns today for follow-up. He is a 71 year old man with a history of paroxysmal atrial fibrillation, hypertension, COPD, and diastolic heart failure. The patient was seen by me a couple of months ago and since then his atrial fibrillation has increased in frequency and severity. He has been taking pill in the pocket propafenone with variable degrees of success. He admits to some sodium indiscretion as he eats out frequently. He has not had syncope. He has been to the emergency room secondary to his atrial fibrillation. His episodes of increased from once a month to several times a week. Allergies  Allergen Reactions  . Polymyxin B-Trimethoprim Swelling    Eye drops made eyes swell  . Codeine Rash and Itching  . Guaifenesin & Derivatives     Other reaction(s): Hallucinations  . Statins Other (See Comments)    Muscle aches, Simvastatin,Atorvstatin,   . Avelox [Moxifloxacin] Other (See Comments)    Stomach cramps  . Guaifenesin Diarrhea  . Mucinex D [Pseudoephedrine-Guaifenesin Er] Nausea And Vomiting    Stomach cramps  . Polymyxin B Other (See Comments)    Swelling  . Pseudoephedrine     Stomach cramps  . Pseudoephedrine-Guaifenesin Nausea And Vomiting    Stomach cramps  . Rosuvastatin Calcium Other (See Comments)    Muscle aches  . Simvastatin Other (See Comments)    Muscle aches and cramps  . Tapentadol Other (See Comments)    unknown  . Vioxx [Rofecoxib] Other (See Comments)    Stomach cramping   . Ciprofloxacin Itching, Rash and Nausea Only  . Codeine Phosphate Itching and Rash     Current Outpatient Prescriptions  Medication Sig Dispense Refill  . clobetasol (TEMOVATE) 0.05 % external solution APPLY 1 APPLICATION TOPICALLY TWICE DAILY 100 mL 3  . clobetasol (TEMOVATE) 0.05 % external solution APPLY 1 APPLICATION TOPICALLY TWICE DAILY 100 mL 3  . ELIQUIS 5 MG TABS tablet Take 1 tablet by mouth two  times daily 180 tablet 3  . ezetimibe (ZETIA) 10  MG tablet TAKE 1 TABLET BY MOUTH  DAILY WITH SUPPER 90 tablet 1  . fluconazole (DIFLUCAN) 100 MG tablet Take 1 tablet (100 mg total) by mouth once a week. Take one by mouth 4 tablet 5  . latanoprost (XALATAN) 0.005 % ophthalmic solution Place 1 drop into the left eye at bedtime.     Marland Kitchen linaclotide (LINZESS) 72 MCG capsule Take 1 capsule by mouth daily.    . magic mouthwash w/lidocaine SOLN Take 5 mLs by mouth 4 (four) times daily as needed for mouth pain. 5 mL 1  . Multiple Vitamins-Minerals (ICAPS MV) TABS Take 1 tablet by mouth 2 (two) times daily.     Marland Kitchen omeprazole (PRILOSEC OTC) 20 MG tablet Take 20 mg by mouth daily.    . propafenone (RYTHMOL) 225 MG tablet Take 1 tablet (225 mg total) by mouth as needed (for Afib). Take 1-2 tablets daily AS NEEDED for Afib 30 tablet 3  . Ranibizumab (LUCENTIS IO) Inject into the eye as directed. (Macular Degeneration)    . metoprolol tartrate (LOPRESSOR) 25 MG tablet Take 0.5 tablets (12.5 mg total) by mouth 2 (two) times daily. 90 tablet 3   No current facility-administered medications for this visit.      Past Medical History:  Diagnosis Date  . Anemia   . Atrial flutter ([place]) 03/31/2012   converted spontaneously to sinus  . CAD (coronary artery disease)    reportedly moderate CAD; managed medically  .  COPD (chronic obstructive pulmonary disease) ([place])   . Eczema 08/03/2014  . GERD (gastroesophageal reflux disease)   . H/O: rheumatic fever   . Heart murmur   . Heart murmur   . High risk medication use    on amiodarone since 03/31/2012  . History of colonoscopy   . Hyperlipidemia   . Hypertension   . Kidney stones   . Macular degeneration   . Osteoporosis 05/31/2016    ROS:   All systems reviewed and negative except as noted in the HPI.   Past Surgical History:  Procedure Laterality Date  . APPENDECTOMY    . CARDIAC CATHETERIZATION  04/22/2011   moderate left main and RCA stenosis not significant by FFR and IVUS on medical therapy  .  CERVICAL DISCECTOMY     C5,6,7 disc fused  with plate and 5 1" screws  . CHOLECYSTECTOMY    . FOOT SURGERY     right calcification removed from top of foot  . knee cartiledge Right    right knee  . MOUTH SURGERY     periodontal surgery, bridges, splint in front,      Family History  Problem Relation Age of Onset  . Heart disease Mother   . Diabetes Mother   . Cirrhosis Mother   . Emphysema Mother        never smoked but 2nd hand through her spouse  . Hypertension Mother   . Macular degeneration Mother   . Heart disease Father   . Cancer Father        prostate  . Hyperlipidemia Father   . Hypertension Father   . Varicose Veins Father   . Heart attack Father   . Peripheral vascular disease Father   . Heart disease Sister   . Arthritis Sister   . Hyperlipidemia Sister   . Obesity Sister   . Macular degeneration Sister   . Heart disease Brother        5 stents  . Hyperlipidemia Brother   . Macular degeneration Maternal Grandfather   . Cirrhosis Sister   . Obesity Sister   . Arthritis Sister   . Heart disease Sister   . Obesity Sister   . Liver disease Unknown   . Prostate cancer Unknown   . Coronary artery disease Unknown      Social History   Social History  . Marital status: Married    Spouse name: N/A  . Number of children: 0  . Years of education: N/A   Occupational History  . retired Retired   Social History Main Topics  . Smoking status: Former Smoker    Packs/day: 0.50    Years: 50.00    Types: Cigarettes    Quit date: 05/01/2013  . Smokeless tobacco: Former Systems developer     Comment: using e-Cig//ldc  . Alcohol use No  . Drug use: No  . Sexual activity: Yes     Comment: lives with wife, no dietary restrictions   Other Topics Concern  . Not on file   Social History Narrative  . No narrative on file     BP 132/78   Pulse (!) 52   Ht 5\' 5"  (1.651 m)   Wt 178 lb 9.6 oz (81 kg)   BMI 29.72 kg/m   Physical Exam:  Well appearing 71 year old  man, NAD HEENT: Unremarkable Neck:  6 cm JVD, no thyromegally Lymphatics:  No adenopathy Back:  No CVA tenderness Lungs:  Clear with no wheezes, rales, or rhonchi.  HEART:  Regular rate rhythm, no murmurs, no rubs, no clicks Abd:  soft, positive bowel sounds, no organomegally, no rebound, no guarding Ext:  2 plus pulses, 1+ peripheral edema, right greater than left, no cyanosis, no clubbing Skin:  No rashes no nodules Neuro:  CN II through XII intact, motor grossly intact  EKG - normal sinus rhythm   Assess/Plan: 1. Paroxysmal atrial fibrillation - his symptoms have increased in frequency and severity. I've recommended that he start taking Rythmol  225 mg twice daily. He will be scheduled for an exercise test. On the day of his exercise test, he will hold his beta blocker. If the patient has worsening atrial fibrillation, he will be referred for catheter ablation if the Rythmol is not successful in controlling his symptoms. 2. Hypertensive heart disease - his blood pressure is minimally elevated today. He will continue his beta blocker. We might consider switching him to carvedilol if the blood pressure is not well controlled. He is strongly encouraged to maintain a low-sodium diet. 3. Preoperative evaluation - the patient is pending spinal injection in his lower back. He may proceed with this. I've recommended that he hold his Eliquis for 2 or perhaps at most 3 days prior to the procedure. 4. Diastolic heart failure - he has some dyspnea with exertion and peripheral edema. He admits to sodium indiscretion. I've encouraged him strongly to stop eating salty food. He may require diuretic therapy in the future.  Cristopher Peru, M.D.

## 2017-05-28 DIAGNOSIS — M4726 Other spondylosis with radiculopathy, lumbar region: Secondary | ICD-10-CM | POA: Diagnosis not present

## 2017-05-28 DIAGNOSIS — M5136 Other intervertebral disc degeneration, lumbar region: Secondary | ICD-10-CM | POA: Diagnosis not present

## 2017-05-28 DIAGNOSIS — Z6830 Body mass index (BMI) 30.0-30.9, adult: Secondary | ICD-10-CM | POA: Diagnosis not present

## 2017-05-28 DIAGNOSIS — M48062 Spinal stenosis, lumbar region with neurogenic claudication: Secondary | ICD-10-CM | POA: Diagnosis not present

## 2017-05-28 DIAGNOSIS — M5416 Radiculopathy, lumbar region: Secondary | ICD-10-CM | POA: Diagnosis not present

## 2017-05-28 DIAGNOSIS — R030 Elevated blood-pressure reading, without diagnosis of hypertension: Secondary | ICD-10-CM | POA: Diagnosis not present

## 2017-06-16 ENCOUNTER — Other Ambulatory Visit: Payer: Self-pay | Admitting: Internal Medicine

## 2017-06-16 ENCOUNTER — Ambulatory Visit (INDEPENDENT_AMBULATORY_CARE_PROVIDER_SITE_OTHER): Payer: Medicare Other

## 2017-06-16 DIAGNOSIS — R002 Palpitations: Secondary | ICD-10-CM | POA: Diagnosis not present

## 2017-06-16 DIAGNOSIS — I4891 Unspecified atrial fibrillation: Secondary | ICD-10-CM

## 2017-06-16 LAB — EXERCISE TOLERANCE TEST
Estimated workload: 4.6 METS
Exercise duration (min): 9 min
Exercise duration (sec): 0 s
MPHR: 150 {beats}/min
Peak HR: 80 {beats}/min
Percent HR: 53 %
RPE: 15
Rest HR: 54 {beats}/min

## 2017-06-16 NOTE — Progress Notes (Signed)
Subjective:   Ryan [name] is a 71 y.o. male who presents for Medicare Annual/Subsequent preventive examination.  Review of Systems:  No ROS.  Medicare Wellness Visit. Additional risk factors are reflected in the social history. Cardiac Risk Factors include: advanced age (>28men, >67 women);hypertension;male gender Sleep patterns: Naps daily. Sleeps 6 hrs per night. Home Safety/Smoke Alarms: Feels safe in home. Smoke alarms in place.  Seat Belt Safety/Bike Helmet: Wears seat belt.  Male:   CCS-  Last 05/03/14 PSA-  Lab Results  Component Value Date   PSA 1.64 01/14/2015   PSA 1.37 12/01/2013   PSA 0.86 05/13/2012       Objective:    Vitals: BP (!) 110/58 (BP Location: Left Arm, Patient Position: Sitting, Cuff Size: Normal)   Pulse (!) 57   Temp 97.8 F (36.6 C) (Oral)   Resp 18   Wt 178 lb 6.4 oz (80.9 kg)   SpO2 97%   BMI 29.69 kg/m   Body mass index is 29.69 kg/m.  Tobacco History  Smoking Status  . Former Smoker  . Packs/day: 0.50  . Years: 50.00  . Types: Cigarettes  . Quit date: 05/01/2013  Smokeless Tobacco  . Former Systems developer    Comment: using e-Cig//ldc     Counseling given: Not Answered   Past Medical History:  Diagnosis Date  . Anemia   . Atrial flutter ([place]) 03/31/2012   converted spontaneously to sinus  . CAD (coronary artery disease)    reportedly moderate CAD; managed medically  . COPD (chronic obstructive pulmonary disease) ([place])   . Eczema 08/03/2014  . GERD (gastroesophageal reflux disease)   . H/O: rheumatic fever   . Heart murmur   . Heart murmur   . High risk medication use    on amiodarone since 03/31/2012  . History of colonoscopy   . Hyperglycemia 06/21/2017  . Hyperlipidemia   . Hypertension   . Kidney stones   . Low vitamin D level 06/21/2017  . Macular degeneration   . Osteoporosis 05/31/2016   Past Surgical History:  Procedure Laterality Date  . APPENDECTOMY    . CARDIAC CATHETERIZATION  04/22/2011   moderate left  main and RCA stenosis not significant by FFR and IVUS on medical therapy  . CERVICAL DISCECTOMY     C5,6,7 disc fused  with plate and 5 1" screws  . CHOLECYSTECTOMY    . FOOT SURGERY     right calcification removed from top of foot  . knee cartiledge Right    right knee  . MOUTH SURGERY     periodontal surgery, bridges, splint in front,    Family History  Problem Relation Age of Onset  . Heart disease Mother   . Diabetes Mother   . Cirrhosis Mother   . Emphysema Mother        never smoked but 2nd hand through her spouse  . Hypertension Mother   . Macular degeneration Mother   . Heart disease Father   . Cancer Father        prostate  . Hyperlipidemia Father   . Hypertension Father   . Varicose Veins Father   . Heart attack Father   . Peripheral vascular disease Father   . Heart disease Sister   . Arthritis Sister   . Hyperlipidemia Sister   . Obesity Sister   . Macular degeneration Sister   . Heart disease Brother        5 stents  . Hyperlipidemia Brother   .  Macular degeneration Maternal Grandfather   . Cirrhosis Sister   . Obesity Sister   . Arthritis Sister   . Heart disease Sister   . Obesity Sister   . Liver disease Unknown   . Prostate cancer Unknown   . Coronary artery disease Unknown    History  Sexual Activity  . Sexual activity: Yes    Comment: lives with wife, no dietary restrictions    Outpatient Encounter Prescriptions as of 06/21/2017  Medication Sig  . clobetasol (TEMOVATE) 0.05 % external solution APPLY 1 APPLICATION TOPICALLY TWICE DAILY  . clobetasol (TEMOVATE) 0.05 % external solution APPLY 1 APPLICATION TOPICALLY TWICE DAILY  . ELIQUIS 5 MG TABS tablet TAKE 1 TABLET BY MOUTH TWO  TIMES DAILY  . ezetimibe (ZETIA) 10 MG tablet TAKE 1 TABLET BY MOUTH  DAILY WITH SUPPER  . fluconazole (DIFLUCAN) 100 MG tablet Take 1 tablet (100 mg total) by mouth once a week. Take one by mouth  . latanoprost (XALATAN) 0.005 % ophthalmic solution Place 1 drop  into the left eye at bedtime.   Marland Kitchen linaclotide (LINZESS) 72 MCG capsule Take 1 capsule by mouth daily.  . magic mouthwash w/lidocaine SOLN Take 5 mLs by mouth 4 (four) times daily as needed for mouth pain.  . metoprolol tartrate (LOPRESSOR) 25 MG tablet Take 0.5 tablets (12.5 mg total) by mouth 2 (two) times daily.  . Multiple Vitamins-Minerals (ICAPS MV) TABS Take 1 tablet by mouth 2 (two) times daily.   Marland Kitchen omeprazole (PRILOSEC OTC) 20 MG tablet Take 20 mg by mouth daily.  . propafenone (RYTHMOL) 225 MG tablet Take 1 tablet (225 mg total) by mouth as needed (for Afib). Take 1-2 tablets daily AS NEEDED for Afib  . Ranibizumab (LUCENTIS IO) Inject into the eye as directed. (Macular Degeneration)  . [DISCONTINUED] ELIQUIS 5 MG TABS tablet Take 1 tablet by mouth two  times daily   No facility-administered encounter medications on file as of 06/21/2017.     Activities of Daily Living In your present state of health, do you have any difficulty performing the following activities: 06/21/2017  Hearing? N  Vision? N  Comment Wearing glasses. DrGreven every 6-14 wks for Macular degeneration.  Also Dr.Bowan  Difficulty concentrating or making decisions? N  Walking or climbing stairs? N  Comment right knee pain r/t sx  Dressing or bathing? N  Doing errands, shopping? N  Preparing Food and eating ? N  Using the Toilet? N  In the past six months, have you accidently leaked urine? N  Do you have problems with loss of bowel control? N  Managing your Medications? N  Managing your Finances? N  Housekeeping or managing your Housekeeping? N  Some recent data might be hidden    Patient Care Team: [name], MD as PCP - General (Family Medicine)   Assessment:    Physical assessment deferred to PCP.  Exercise Activities and Dietary recommendations Current Exercise Habits: Home exercise routine, Type of exercise: treadmill;strength training/weights, Time (Minutes): 30, Frequency (Times/Week): 7,  Weekly Exercise (Minutes/Week): 210, Intensity: Mild   Diet (meal preparation, eat out, water intake, caffeinated beverages, dairy products, fruits and vegetables): in general, a "healthy" diet  , well balanced, on average, 3 meals per day  Goals    None     Fall Risk Fall Risk  06/01/2016 05/19/2016 01/14/2015 01/14/2015 12/01/2013  Falls in the past year? Yes No No No No  Comment Ryan [name]: data to providers prior to load - - - -  Number falls in past yr: 1 - - - -  Comment Ryan [name] Actual Response = 1 - - - -  Injury with Fall? No - - - -   Depression Screen PHQ 2/9 Scores 05/19/2016 01/14/2015 01/14/2015 12/01/2013  PHQ - 2 Score 0 0 0 1  PHQ- 9 Score - - - -    Cognitive Function Ad8 score reviewed for issues:  Issues making decisions:no  Less interest in hobbies / activities:no  Repeats questions, stories (family complaining):no  Trouble using ordinary gadgets (microwave, computer, phone):no  Forgets the month or year: no  Mismanaging finances: no  Remembering appts:no  Daily problems with thinking and/or memory:no Ad8 score is=0         Immunization History  Administered Date(s) Administered  . Influenza Split 07/06/2011, 06/22/2013  . Influenza Whole 10/05/2000, 07/18/2009, 06/20/2010, 07/05/2012  . Influenza, High Dose Seasonal PF 06/23/2016, 06/21/2017  . Influenza,inj,Quad PF,6+ Mos 06/25/2014  . Pneumococcal Conjugate-13 12/01/2013  . Pneumococcal Polysaccharide-23 07/06/2007, 06/23/2016  . Td 10/05/2002  . Tdap 05/06/2012  . Zoster 03/27/2009   Screening Tests Health Maintenance  Topic Date Due  . INFLUENZA VACCINE  05/05/2017  . TETANUS/TDAP  05/06/2022  . COLONOSCOPY  05/03/2024  . Hepatitis C Screening  Completed  . PNA vac Low Risk Adult  Completed      Plan:   Follow up with PCP as directed.  Continue to eat heart healthy diet (full of fruits, vegetables, whole grains, lean protein, water--limit salt, fat, and  sugar intake) and increase physical activity as tolerated.  Continue doing brain stimulating activities (puzzles, reading, adult coloring books, staying active) to keep memory sharp.     I have personally reviewed and noted the following in the patient's chart:   . Medical and social history . Use of alcohol, tobacco or illicit drugs  . Current medications and supplements . Functional ability and status . Nutritional status . Physical activity . Advanced directives . List of other physicians . Hospitalizations, surgeries, and ER visits in previous 12 months . Vitals . Screenings to include cognitive, depression, and falls . Referrals and appointments  In addition, I have reviewed and discussed with patient certain preventive protocols, quality metrics, and best practice recommendations. A written personalized care plan for preventive services as well as general preventive health recommendations were provided to patient.     Naaman Plummer [place], [place]  06/21/2017

## 2017-06-21 ENCOUNTER — Encounter: Payer: Self-pay | Admitting: Family Medicine

## 2017-06-21 ENCOUNTER — Ambulatory Visit (INDEPENDENT_AMBULATORY_CARE_PROVIDER_SITE_OTHER): Payer: Medicare Other | Admitting: Family Medicine

## 2017-06-21 ENCOUNTER — Telehealth: Payer: Self-pay | Admitting: Internal Medicine

## 2017-06-21 VITALS — BP 110/58 | HR 57 | Temp 97.8°F | Resp 18 | Wt 178.4 lb

## 2017-06-21 DIAGNOSIS — E782 Mixed hyperlipidemia: Secondary | ICD-10-CM | POA: Diagnosis not present

## 2017-06-21 DIAGNOSIS — K5909 Other constipation: Secondary | ICD-10-CM

## 2017-06-21 DIAGNOSIS — N200 Calculus of kidney: Secondary | ICD-10-CM | POA: Diagnosis not present

## 2017-06-21 DIAGNOSIS — M810 Age-related osteoporosis without current pathological fracture: Secondary | ICD-10-CM | POA: Diagnosis not present

## 2017-06-21 DIAGNOSIS — R7989 Other specified abnormal findings of blood chemistry: Secondary | ICD-10-CM | POA: Insufficient documentation

## 2017-06-21 DIAGNOSIS — N400 Enlarged prostate without lower urinary tract symptoms: Secondary | ICD-10-CM

## 2017-06-21 DIAGNOSIS — Z0000 Encounter for general adult medical examination without abnormal findings: Secondary | ICD-10-CM | POA: Diagnosis not present

## 2017-06-21 DIAGNOSIS — I10 Essential (primary) hypertension: Secondary | ICD-10-CM

## 2017-06-21 DIAGNOSIS — Z23 Encounter for immunization: Secondary | ICD-10-CM

## 2017-06-21 DIAGNOSIS — R739 Hyperglycemia, unspecified: Secondary | ICD-10-CM | POA: Diagnosis not present

## 2017-06-21 DIAGNOSIS — I4891 Unspecified atrial fibrillation: Secondary | ICD-10-CM

## 2017-06-21 DIAGNOSIS — H3530 Unspecified macular degeneration: Secondary | ICD-10-CM | POA: Diagnosis not present

## 2017-06-21 DIAGNOSIS — I4892 Unspecified atrial flutter: Secondary | ICD-10-CM | POA: Diagnosis not present

## 2017-06-21 HISTORY — DX: Hyperglycemia, unspecified: R73.9

## 2017-06-21 HISTORY — DX: Other specified abnormal findings of blood chemistry: R79.89

## 2017-06-21 HISTORY — DX: Calculus of kidney: N20.0

## 2017-06-21 LAB — CBC
HCT: 39.1 % (ref 39.0–52.0)
Hemoglobin: 13.2 g/dL (ref 13.0–17.0)
MCHC: 33.7 g/dL (ref 30.0–36.0)
MCV: 91.7 fl (ref 78.0–100.0)
Platelets: 198 10*3/uL (ref 150.0–400.0)
RBC: 4.27 Mil/uL (ref 4.22–5.81)
RDW: 13.7 % (ref 11.5–15.5)
WBC: 8.4 10*3/uL (ref 4.0–10.5)

## 2017-06-21 LAB — COMPREHENSIVE METABOLIC PANEL
ALT: 17 U/L (ref 0–53)
AST: 18 U/L (ref 0–37)
Albumin: 3.9 g/dL (ref 3.5–5.2)
Alkaline Phosphatase: 54 U/L (ref 39–117)
BUN: 19 mg/dL (ref 6–23)
CO2: 28 mEq/L (ref 19–32)
Calcium: 9.3 mg/dL (ref 8.4–10.5)
Chloride: 103 mEq/L (ref 96–112)
Creatinine, Ser: 1.01 mg/dL (ref 0.40–1.50)
GFR: 77.52 mL/min (ref >60.00)
Glucose, Bld: 70 mg/dL (ref 70–99)
Potassium: 4.5 mEq/L (ref 3.5–5.1)
Sodium: 136 mEq/L (ref 135–145)
Total Bilirubin: 0.6 mg/dL (ref 0.2–1.2)
Total Protein: 6.9 g/dL (ref 6.0–8.3)

## 2017-06-21 LAB — TSH: TSH: 2.14 u[IU]/mL (ref 0.35–4.50)

## 2017-06-21 LAB — LIPID PANEL
Cholesterol: 178 mg/dL (ref 0–200)
HDL: 47.4 mg/dL (ref >39.00)
LDL Cholesterol: 116 mg/dL — ABNORMAL HIGH (ref 0–99)
NonHDL: 130.76
Total CHOL/HDL Ratio: 4
Triglycerides: 76 mg/dL (ref 0.0–149.0)
VLDL: 15.2 mg/dL (ref 0.0–40.0)

## 2017-06-21 LAB — HEMOGLOBIN A1C: Hgb A1c MFr Bld: 5.3 % (ref 4.6–6.5)

## 2017-06-21 LAB — VITAMIN D 25 HYDROXY (VIT D DEFICIENCY, FRACTURES): VITD: 58.95 ng/mL (ref 30.00–100.00)

## 2017-06-21 MED ORDER — PROPAFENONE HCL 225 MG PO TABS: 225.0000 mg | ORAL_TABLET | Freq: Two times a day (BID) | ORAL | 3 refills | Status: DC

## 2017-06-21 NOTE — Telephone Encounter (Signed)
Pt c/o medication issue:  1. Name of Medication: Rythmol   2. How are you currently taking this medication (dosage and times per day)? 225mg    3. Are you having a reaction (difficulty breathing--STAT)? no  4. What is your medication issue? Per pt would like to speak with RN to see if he would need to continue this medication. If so pt states he will need a prescription. Please call back to discuss

## 2017-06-21 NOTE — Assessment & Plan Note (Signed)
Encouraged heart healthy diet, increase exercise, avoid trans fats, consider a krill oil cap daily 

## 2017-06-21 NOTE — Assessment & Plan Note (Signed)
Follows with Dr Rosana Hoes of urology

## 2017-06-21 NOTE — Progress Notes (Signed)
Subjective:  I acted as a Education administrator for Dr. Charlett Blake. [place], [place]  Patient ID: Ryan [name], male    DOB: 1946/08/18, 71 y.o.   MRN: [id number]  Chief Complaint  Patient presents with  . Medicare Wellness    with RN    HPI  Patient is in today for a follow up. He is here today with his wife. He is doing well today. He has been seen by numerous doctors since last visit. [name] has increased his Propafenone to bid and prn and he is tolerating it. Felt he had too much fatigue and weight gain with increased Metoprolol so they decreased it again. No acute febrile ilnesses or hospitalization. Denies CP/palp/SOB/HA/congestion/fevers/GI or GU c/o. Taking meds as prescribed  Patient Care Team: [name], MD as PCP - General (Family Medicine) Jovita Gamma, MD as Consulting Physician (Neurosurgery) Evans Lance, MD as Consulting Physician (Cardiology) Gerarda Fraction, MD as Referring Physician (Ophthalmology) Richmond Campbell, MD as Consulting Physician (Gastroenterology)   Past Medical History:  Diagnosis Date  . Anemia   . Atrial flutter ([place]) 03/31/2012   converted spontaneously to sinus  . CAD (coronary artery disease)    reportedly moderate CAD; managed medically  . COPD (chronic obstructive pulmonary disease) ([place])   . Eczema 08/03/2014  . GERD (gastroesophageal reflux disease)   . H/O: rheumatic fever   . Heart murmur   . Heart murmur   . High risk medication use    on amiodarone since 03/31/2012  . History of colonoscopy   . Hyperglycemia 06/21/2017  . Hyperlipidemia   . Hypertension   . Kidney stone 06/21/2017  . Kidney stones   . Low vitamin D level 06/21/2017  . Macular degeneration   . Osteoporosis 05/31/2016    Past Surgical History:  Procedure Laterality Date  . APPENDECTOMY    . CARDIAC CATHETERIZATION  04/22/2011   moderate left main and RCA stenosis not significant by FFR and IVUS on medical therapy  . CERVICAL DISCECTOMY     C5,6,7 disc fused  with  plate and 5 1" screws  . CHOLECYSTECTOMY    . FOOT SURGERY     right calcification removed from top of foot  . knee cartiledge Right    right knee  . MOUTH SURGERY     periodontal surgery, bridges, splint in front,     Family History  Problem Relation Age of Onset  . Heart disease Mother   . Diabetes Mother   . Cirrhosis Mother   . Emphysema Mother        never smoked but 2nd hand through her spouse  . Hypertension Mother   . Macular degeneration Mother   . Heart disease Father   . Cancer Father        prostate  . Hyperlipidemia Father   . Hypertension Father   . Varicose Veins Father   . Heart attack Father   . Peripheral vascular disease Father   . Heart disease Sister   . Arthritis Sister   . Hyperlipidemia Sister   . Obesity Sister   . Macular degeneration Sister   . Heart disease Brother        5 stents  . Hyperlipidemia Brother   . Macular degeneration Maternal Grandfather   . Cirrhosis Sister   . Obesity Sister   . Arthritis Sister   . Heart disease Sister   . Obesity Sister   . Liver disease Unknown   . Prostate cancer Unknown   .  Coronary artery disease Unknown     Social History   Social History  . Marital status: Married    Spouse name: N/A  . Number of children: 0  . Years of education: N/A   Occupational History  . retired Retired   Social History Main Topics  . Smoking status: Former Smoker    Packs/day: 0.50    Years: 50.00    Types: Cigarettes    Quit date: 05/01/2013  . Smokeless tobacco: Former Systems developer     Comment: using e-Cig//ldc  . Alcohol use No  . Drug use: No  . Sexual activity: Yes     Comment: lives with wife, no dietary restrictions   Other Topics Concern  . Not on file   Social History Narrative  . No narrative on file    Outpatient Medications Prior to Visit  Medication Sig Dispense Refill  . clobetasol (TEMOVATE) 0.05 % external solution APPLY 1 APPLICATION TOPICALLY TWICE DAILY 100 mL 3  . clobetasol (TEMOVATE)  0.05 % external solution APPLY 1 APPLICATION TOPICALLY TWICE DAILY 100 mL 3  . ELIQUIS 5 MG TABS tablet TAKE 1 TABLET BY MOUTH TWO  TIMES DAILY 180 tablet 3  . ezetimibe (ZETIA) 10 MG tablet TAKE 1 TABLET BY MOUTH  DAILY WITH SUPPER 90 tablet 1  . fluconazole (DIFLUCAN) 100 MG tablet Take 1 tablet (100 mg total) by mouth once a week. Take one by mouth 4 tablet 5  . latanoprost (XALATAN) 0.005 % ophthalmic solution Place 1 drop into the left eye at bedtime.     Marland Kitchen linaclotide (LINZESS) 72 MCG capsule Take 1 capsule by mouth daily.    . magic mouthwash w/lidocaine SOLN Take 5 mLs by mouth 4 (four) times daily as needed for mouth pain. 5 mL 1  . metoprolol tartrate (LOPRESSOR) 25 MG tablet Take 0.5 tablets (12.5 mg total) by mouth 2 (two) times daily. 90 tablet 3  . Multiple Vitamins-Minerals (ICAPS MV) TABS Take 1 tablet by mouth 2 (two) times daily.     Marland Kitchen omeprazole (PRILOSEC OTC) 20 MG tablet Take 20 mg by mouth daily.    . propafenone (RYTHMOL) 225 MG tablet Take 1 tablet (225 mg total) by mouth as needed (for Afib). Take 1-2 tablets daily AS NEEDED for Afib 30 tablet 3  . Ranibizumab (LUCENTIS IO) Inject into the eye as directed. (Macular Degeneration)     No facility-administered medications prior to visit.     Allergies  Allergen Reactions  . Polymyxin B-Trimethoprim Swelling    Eye drops made eyes swell  . Codeine Rash and Itching  . Guaifenesin & Derivatives     Other reaction(s): Hallucinations  . Statins Other (See Comments)    Muscle aches, Simvastatin,Atorvstatin,   . Avelox [Moxifloxacin] Other (See Comments)    Stomach cramps  . Guaifenesin Diarrhea  . Mucinex D [Pseudoephedrine-Guaifenesin Er] Nausea And Vomiting    Stomach cramps  . Polymyxin B Other (See Comments)    Swelling  . Pseudoephedrine     Stomach cramps  . Pseudoephedrine-Guaifenesin Nausea And Vomiting    Stomach cramps  . Rosuvastatin Calcium Other (See Comments)    Muscle aches  . Simvastatin Other  (See Comments)    Muscle aches and cramps  . Tapentadol Other (See Comments)    unknown  . Vioxx [Rofecoxib] Other (See Comments)    Stomach cramping   . Ciprofloxacin Itching, Rash and Nausea Only  . Codeine Phosphate Itching and Rash    Review of  Systems  Constitutional: Positive for malaise/fatigue. Negative for fever.  HENT: Negative for congestion.   Eyes: Negative for blurred vision.  Respiratory: Negative for shortness of breath.   Cardiovascular: Negative for chest pain, palpitations and leg swelling.  Gastrointestinal: Negative for abdominal pain, blood in stool and nausea.  Genitourinary: Negative for dysuria and frequency.  Musculoskeletal: Negative for falls.  Skin: Negative for rash.  Neurological: Negative for dizziness, loss of consciousness and headaches.  Endo/Heme/Allergies: Negative for environmental allergies.  Psychiatric/Behavioral: Negative for depression. The patient is not nervous/anxious.        Objective:    Physical Exam  BP (!) 110/58 (BP Location: Left Arm, Patient Position: Sitting, Cuff Size: Normal)   Pulse (!) 57   Temp 97.8 F (36.6 C) (Oral)   Resp 18   Wt 178 lb 6.4 oz (80.9 kg)   SpO2 97%   BMI 29.69 kg/m  Wt Readings from Last 3 Encounters:  06/21/17 178 lb 6.4 oz (80.9 kg)  05/25/17 178 lb 9.6 oz (81 kg)  02/13/17 172 lb (78 kg)   BP Readings from Last 3 Encounters:  06/21/17 (!) 110/58  05/25/17 132/78  03/27/17 (!) 142/102     Immunization History  Administered Date(s) Administered  . Influenza Split 07/06/2011, 06/22/2013  . Influenza Whole 10/05/2000, 07/18/2009, 06/20/2010, 07/05/2012  . Influenza, High Dose Seasonal PF 06/23/2016, 06/21/2017  . Influenza,inj,Quad PF,6+ Mos 06/25/2014  . Pneumococcal Conjugate-13 12/01/2013  . Pneumococcal Polysaccharide-23 07/06/2007, 06/23/2016  . Td 10/05/2002  . Tdap 05/06/2012  . Zoster 03/27/2009    Health Maintenance  Topic Date Due  . INFLUENZA VACCINE  05/05/2017    . TETANUS/TDAP  05/06/2022  . COLONOSCOPY  05/03/2024  . Hepatitis C Screening  Completed  . PNA vac Low Risk Adult  Completed    Lab Results  Component Value Date   WBC 9.0 03/27/2017   HGB 14.8 03/27/2017   HCT 41.0 03/27/2017   PLT 225 03/27/2017   GLUCOSE 126 (H) 03/27/2017   CHOL 183 11/20/2016   TRIG 75.0 11/20/2016   HDL 49.90 11/20/2016   LDLDIRECT 134.2 12/01/2013   LDLCALC 118 (H) 11/20/2016   ALT 14 11/20/2016   AST 15 11/20/2016   NA 138 03/27/2017   K 3.6 03/27/2017   CL 105 03/27/2017   CREATININE 0.91 03/27/2017   BUN 13 03/27/2017   CO2 23 03/27/2017   TSH 1.37 11/20/2016   PSA 1.64 01/14/2015   INR 1.17 04/01/2012    Lab Results  Component Value Date   TSH 1.37 11/20/2016   Lab Results  Component Value Date   WBC 9.0 03/27/2017   HGB 14.8 03/27/2017   HCT 41.0 03/27/2017   MCV 87.2 03/27/2017   PLT 225 03/27/2017   Lab Results  Component Value Date   NA 138 03/27/2017   K 3.6 03/27/2017   CO2 23 03/27/2017   GLUCOSE 126 (H) 03/27/2017   BUN 13 03/27/2017   CREATININE 0.91 03/27/2017   BILITOT 0.5 11/20/2016   ALKPHOS 49 11/20/2016   AST 15 11/20/2016   ALT 14 11/20/2016   PROT 6.7 11/20/2016   ALBUMIN 3.8 11/20/2016   CALCIUM 9.4 03/27/2017   ANIONGAP 10 03/27/2017   GFR 85.41 11/20/2016   Lab Results  Component Value Date   CHOL 183 11/20/2016   Lab Results  Component Value Date   HDL 49.90 11/20/2016   Lab Results  Component Value Date   LDLCALC 118 (H) 11/20/2016   Lab Results  Component Value Date   TRIG 75.0 11/20/2016   Lab Results  Component Value Date   CHOLHDL 4 11/20/2016   No results found for: HGBA1C       Assessment & Plan:   Problem List Items Addressed This Visit    Hyperlipidemia, mixed    Encouraged heart healthy diet, increase exercise, avoid trans fats, consider a krill oil cap daily      Relevant Orders   Lipid panel   Macular degeneration (senile) of retina    Has shot # 12 in his  left eye this week with Dr Cordelia Pen      Essential hypertension    Well controlled, no changes to meds. Encouraged heart healthy diet such as the DASH diet and exercise as tolerated.       Relevant Orders   CBC   Comprehensive metabolic panel   TSH   Atrial flutter with rapid ventricular response (HCC)    Following closely with [name] has struggled with multiple medications but he has had significant side effects including excessive fatigue.       Chronic constipation    Encouraged increased hydration and fiber in diet. Daily probiotics. If bowels not moving can use MOM 2 tbls po in 4 oz of warm prune juice by mouth every 2-3 days. If no results then repeat in 4 hours with  Dulcolax suppository pr, may repeat again in 4 more hours as needed. Seek care if symptoms worsen. Consider daily Miralax and/or Dulcolax if symptoms persist. Linzess helping most days. Follows with Dr Allyn Kenner      BPH (benign prostatic hyperplasia)    Follows with Dr Rosana Hoes of urology      Osteoporosis    Encouraged to get adequate exercise, calcium and vitamin d intake. Check Vitamin D level today. Takes Vitamin D every other day, 5000 IU cap      Relevant Orders   VITAMIN D 25 Hydroxy (Vit-D Deficiency, Fractures)   Hyperglycemia    Check hgba1c.minimize simple carbs. Increase exercise as tolerated.       Relevant Orders   Hemoglobin A1c   Kidney stone    Follows with Dr Rosana Hoes of urology       Other Visit Diagnoses    Needs flu shot    -  Primary   Relevant Orders   Flu vaccine HIGH DOSE PF (Fluzone High dose) (Completed)      I am having Mr. Carlota Raspberry maintain his ICAPS MV, omeprazole, Ranibizumab (LUCENTIS IO), magic mouthwash w/lidocaine, latanoprost, fluconazole, clobetasol, ezetimibe, linaclotide, metoprolol tartrate, propafenone, clobetasol, and ELIQUIS.  No orders of the defined types were placed in this encounter.   CMA served as Education administrator during this visit. History, Physical and Plan  performed by medical provider. Documentation and orders reviewed and attested to.  Penni Homans, MD

## 2017-06-21 NOTE — Assessment & Plan Note (Addendum)
Encouraged increased hydration and fiber in diet. Daily probiotics. If bowels not moving can use MOM 2 tbls po in 4 oz of warm prune juice by mouth every 2-3 days. If no results then repeat in 4 hours with  Dulcolax suppository pr, may repeat again in 4 more hours as needed. Seek care if symptoms worsen. Consider daily Miralax and/or Dulcolax if symptoms persist. Linzess helping most days. Follows with Dr Allyn Kenner

## 2017-06-21 NOTE — Patient Instructions (Addendum)
Encouraged increased hydration and fiber in diet. Daily probiotics. If bowels not moving can use MOM 2 tbls po in 4 oz of warm prune juice by mouth every 2-3 days. If no results then repeat in 4 hours with  Dulcolax suppository pr, may repeat again in 4 more hours as needed. Seek care if symptoms worsen. Consider daily Miralax and/or Dulcolax if symptoms persist.    Hypertension Hypertension, commonly called high blood pressure, is when the force of blood pumping through the arteries is too strong. The arteries are the blood vessels that carry blood from the heart throughout the body. Hypertension forces the heart to work harder to pump blood and may cause arteries to become narrow or stiff. Having untreated or uncontrolled hypertension can cause heart attacks, strokes, kidney disease, and other problems. A blood pressure reading consists of a higher number over a lower number. Ideally, your blood pressure should be below 120/80. The first ("top") number is called the systolic pressure. It is a measure of the pressure in your arteries as your heart beats. The second ("bottom") number is called the diastolic pressure. It is a measure of the pressure in your arteries as the heart relaxes. What are the causes? The cause of this condition is not known. What increases the risk? Some risk factors for high blood pressure are under your control. Others are not. Factors you can change  Smoking.  Having type 2 diabetes mellitus, high cholesterol, or both.  Not getting enough exercise or physical activity.  Being overweight.  Having too much fat, sugar, calories, or salt (sodium) in your diet.  Drinking too much alcohol. Factors that are difficult or impossible to change  Having chronic kidney disease.  Having a family history of high blood pressure.  Age. Risk increases with age.  Race. You may be at higher risk if you are African-American.  Gender. Men are at higher risk than women before age  107. After age 83, women are at higher risk than men.  Having obstructive sleep apnea.  Stress. What are the signs or symptoms? Extremely high blood pressure (hypertensive crisis) may cause:  Headache.  Anxiety.  Shortness of breath.  Nosebleed.  Nausea and vomiting.  Severe chest pain.  Jerky movements you cannot control (seizures).  How is this diagnosed? This condition is diagnosed by measuring your blood pressure while you are seated, with your arm resting on a surface. The cuff of the blood pressure monitor will be placed directly against the skin of your upper arm at the level of your heart. It should be measured at least twice using the same arm. Certain conditions can cause a difference in blood pressure between your right and left arms. Certain factors can cause blood pressure readings to be lower or higher than normal (elevated) for a short period of time:  When your blood pressure is higher when you are in a health care provider's office than when you are at home, this is called white coat hypertension. Most people with this condition do not need medicines.  When your blood pressure is higher at home than when you are in a health care provider's office, this is called masked hypertension. Most people with this condition may need medicines to control blood pressure.  If you have a high blood pressure reading during one visit or you have normal blood pressure with other risk factors:  You may be asked to return on a different day to have your blood pressure checked again.  You  may be asked to monitor your blood pressure at home for 1 week or longer.  If you are diagnosed with hypertension, you may have other blood or imaging tests to help your health care provider understand your overall risk for other conditions. How is this treated? This condition is treated by making healthy lifestyle changes, such as eating healthy foods, exercising more, and reducing your alcohol  intake. Your health care provider may prescribe medicine if lifestyle changes are not enough to get your blood pressure under control, and if:  Your systolic blood pressure is above 130.  Your diastolic blood pressure is above 80.  Your personal target blood pressure may vary depending on your medical conditions, your age, and other factors. Follow these instructions at home: Eating and drinking  Eat a diet that is high in fiber and potassium, and low in sodium, added sugar, and fat. An example eating plan is called the DASH (Dietary Approaches to Stop Hypertension) diet. To eat this way: ? Eat plenty of fresh fruits and vegetables. Try to fill half of your plate at each meal with fruits and vegetables. ? Eat whole grains, such as whole wheat pasta, brown rice, or whole grain bread. Fill about one quarter of your plate with whole grains. ? Eat or drink low-fat dairy products, such as skim milk or low-fat yogurt. ? Avoid fatty cuts of meat, processed or cured meats, and poultry with skin. Fill about one quarter of your plate with lean proteins, such as fish, chicken without skin, beans, eggs, and tofu. ? Avoid premade and processed foods. These tend to be higher in sodium, added sugar, and fat.  Reduce your daily sodium intake. Most people with hypertension should eat less than 1,500 mg of sodium a day.  Limit alcohol intake to no more than 1 drink a day for nonpregnant women and 2 drinks a day for men. One drink equals 12 oz of beer, 5 oz of wine, or 1 oz of hard liquor. Lifestyle  Work with your health care provider to maintain a healthy body weight or to lose weight. Ask what an ideal weight is for you.  Get at least 30 minutes of exercise that causes your heart to beat faster (aerobic exercise) most days of the week. Activities may include walking, swimming, or biking.  Include exercise to strengthen your muscles (resistance exercise), such as pilates or lifting weights, as part of your  weekly exercise routine. Try to do these types of exercises for 30 minutes at least 3 days a week.  Do not use any products that contain nicotine or tobacco, such as cigarettes and e-cigarettes. If you need help quitting, ask your health care provider.  Monitor your blood pressure at home as told by your health care provider.  Keep all follow-up visits as told by your health care provider. This is important. Medicines  Take over-the-counter and prescription medicines only as told by your health care provider. Follow directions carefully. Blood pressure medicines must be taken as prescribed.  Do not skip doses of blood pressure medicine. Doing this puts you at risk for problems and can make the medicine less effective.  Ask your health care provider about side effects or reactions to medicines that you should watch for. Contact a health care provider if:  You think you are having a reaction to a medicine you are taking.  You have headaches that keep coming back (recurring).  You feel dizzy.  You have swelling in your ankles.  You  have trouble with your vision. Get help right away if:  You develop a severe headache or confusion.  You have unusual weakness or numbness.  You feel faint.  You have severe pain in your chest or abdomen.  You vomit repeatedly.  You have trouble breathing. Summary  Hypertension is when the force of blood pumping through your arteries is too strong. If this condition is not controlled, it may put you at risk for serious complications.  Your personal target blood pressure may vary depending on your medical conditions, your age, and other factors. For most people, a normal blood pressure is less than 120/80.  Hypertension is treated with lifestyle changes, medicines, or a combination of both. Lifestyle changes include weight loss, eating a healthy, low-sodium diet, exercising more, and limiting alcohol. This information is not intended to replace  advice given to you by your health care provider. Make sure you discuss any questions you have with your health care provider. Document Released: 09/21/2005 Document Revised: 08/19/2016 Document Reviewed: 08/19/2016 Elsevier Interactive Patient Education  2018 [employer].   Ryan [name] , Thank you for taking time to come for your Medicare Wellness Visit. I appreciate your ongoing commitment to your health goals. Please review the following plan we discussed and let me know if I can assist you in the future.   These are the goals we discussed: Goals    None      This is a list of the screening recommended for you and due dates:  Health Maintenance  Topic Date Due  . Flu Shot  05/05/2017  . Tetanus Vaccine  05/06/2022  . Colon Cancer Screening  05/03/2024  .  Hepatitis C: One time screening is recommended by Center for Disease Control  (CDC) for  adults born from [birth year] through 1964.   Completed  . Pneumonia vaccines  Completed

## 2017-06-21 NOTE — Assessment & Plan Note (Signed)
Following closely with [name] has struggled with multiple medications but he has had significant side effects including excessive fatigue.

## 2017-06-21 NOTE — Assessment & Plan Note (Addendum)
Has shot # 56 in his left eye this week with Dr Cordelia Pen

## 2017-06-21 NOTE — Assessment & Plan Note (Signed)
Taking vitamin D every other day.check level

## 2017-06-21 NOTE — Assessment & Plan Note (Signed)
Check hgba1c.minimize simple carbs. Increase exercise as tolerated.

## 2017-06-21 NOTE — Assessment & Plan Note (Signed)
Well controlled, no changes to meds. Encouraged heart healthy diet such as the DASH diet and exercise as tolerated.  °

## 2017-06-21 NOTE — Telephone Encounter (Signed)
Reviewed results of stress test with Pt.  Per Dr. Lovena Le, stress test ok and Pt to continue current meds.  Pt requested Rhythmol be sent in to pharmacy.  Entered prescription as requested.

## 2017-06-21 NOTE — Assessment & Plan Note (Addendum)
Encouraged to get adequate exercise, calcium and vitamin d intake. Check Vitamin D level today. Takes Vitamin D every other day, 5000 IU cap

## 2017-06-24 DIAGNOSIS — H35372 Puckering of macula, left eye: Secondary | ICD-10-CM | POA: Diagnosis not present

## 2017-06-24 DIAGNOSIS — H353110 Nonexudative age-related macular degeneration, right eye, stage unspecified: Secondary | ICD-10-CM | POA: Diagnosis not present

## 2017-06-24 DIAGNOSIS — H35361 Drusen (degenerative) of macula, right eye: Secondary | ICD-10-CM | POA: Diagnosis not present

## 2017-06-24 DIAGNOSIS — H353221 Exudative age-related macular degeneration, left eye, with active choroidal neovascularization: Secondary | ICD-10-CM | POA: Diagnosis not present

## 2017-07-01 DIAGNOSIS — M4726 Other spondylosis with radiculopathy, lumbar region: Secondary | ICD-10-CM | POA: Diagnosis not present

## 2017-07-01 DIAGNOSIS — M5116 Intervertebral disc disorders with radiculopathy, lumbar region: Secondary | ICD-10-CM | POA: Diagnosis not present

## 2017-07-01 DIAGNOSIS — M48061 Spinal stenosis, lumbar region without neurogenic claudication: Secondary | ICD-10-CM | POA: Diagnosis not present

## 2017-07-12 ENCOUNTER — Other Ambulatory Visit: Payer: Self-pay | Admitting: Family Medicine

## 2017-07-12 ENCOUNTER — Encounter: Payer: Self-pay | Admitting: Family Medicine

## 2017-07-12 DIAGNOSIS — R361 Hematospermia: Secondary | ICD-10-CM

## 2017-07-13 ENCOUNTER — Other Ambulatory Visit: Payer: Self-pay | Admitting: Family Medicine

## 2017-07-13 DIAGNOSIS — K219 Gastro-esophageal reflux disease without esophagitis: Secondary | ICD-10-CM

## 2017-07-13 DIAGNOSIS — B370 Candidal stomatitis: Secondary | ICD-10-CM

## 2017-07-13 DIAGNOSIS — I10 Essential (primary) hypertension: Secondary | ICD-10-CM

## 2017-07-13 DIAGNOSIS — K625 Hemorrhage of anus and rectum: Secondary | ICD-10-CM

## 2017-07-16 NOTE — Telephone Encounter (Signed)
Sent note to pharm to plz advise pt to call office to discuss need for this med/thx dmf

## 2017-07-18 ENCOUNTER — Other Ambulatory Visit: Payer: Self-pay | Admitting: Family Medicine

## 2017-07-19 ENCOUNTER — Other Ambulatory Visit: Payer: Self-pay

## 2017-07-19 MED ORDER — METOPROLOL TARTRATE 25 MG PO TABS: 12.5000 mg | ORAL_TABLET | Freq: Two times a day (BID) | ORAL | 0 refills | Status: DC

## 2017-07-20 DIAGNOSIS — L814 Other melanin hyperpigmentation: Secondary | ICD-10-CM | POA: Diagnosis not present

## 2017-07-20 DIAGNOSIS — L219 Seborrheic dermatitis, unspecified: Secondary | ICD-10-CM | POA: Diagnosis not present

## 2017-07-20 DIAGNOSIS — D225 Melanocytic nevi of trunk: Secondary | ICD-10-CM | POA: Diagnosis not present

## 2017-07-20 DIAGNOSIS — Z23 Encounter for immunization: Secondary | ICD-10-CM | POA: Diagnosis not present

## 2017-07-20 DIAGNOSIS — D1801 Hemangioma of skin and subcutaneous tissue: Secondary | ICD-10-CM | POA: Diagnosis not present

## 2017-07-20 DIAGNOSIS — L821 Other seborrheic keratosis: Secondary | ICD-10-CM | POA: Diagnosis not present

## 2017-07-20 DIAGNOSIS — D692 Other nonthrombocytopenic purpura: Secondary | ICD-10-CM | POA: Diagnosis not present

## 2017-07-21 NOTE — Telephone Encounter (Signed)
Faxed #[phone] till OV/thx dmf

## 2017-07-21 NOTE — Telephone Encounter (Signed)
Ryan [name] Self [phone]  Patient calling to see why this has not been sent in yet. He is about to run out. Please call and advise.

## 2017-07-25 ENCOUNTER — Emergency Department (HOSPITAL_BASED_OUTPATIENT_CLINIC_OR_DEPARTMENT_OTHER)
Admission: EM | Admit: 2017-07-25 | Discharge: 2017-07-25 | Disposition: A | Discharge status: Home / Self Care | Payer: Medicare Other | Attending: Emergency Medicine | Admitting: Emergency Medicine

## 2017-07-25 ENCOUNTER — Encounter: Payer: Self-pay | Admitting: Family Medicine

## 2017-07-25 ENCOUNTER — Encounter (HOSPITAL_BASED_OUTPATIENT_CLINIC_OR_DEPARTMENT_OTHER): Payer: Self-pay | Admitting: *Deleted

## 2017-07-25 DIAGNOSIS — Z87891 Personal history of nicotine dependence: Secondary | ICD-10-CM | POA: Insufficient documentation

## 2017-07-25 DIAGNOSIS — Z79899 Other long term (current) drug therapy: Secondary | ICD-10-CM | POA: Diagnosis not present

## 2017-07-25 DIAGNOSIS — R350 Frequency of micturition: Secondary | ICD-10-CM | POA: Diagnosis not present

## 2017-07-25 DIAGNOSIS — I2510 Atherosclerotic heart disease of native coronary artery without angina pectoris: Secondary | ICD-10-CM | POA: Diagnosis not present

## 2017-07-25 DIAGNOSIS — Z87438 Personal history of other diseases of male genital organs: Secondary | ICD-10-CM | POA: Diagnosis not present

## 2017-07-25 DIAGNOSIS — J449 Chronic obstructive pulmonary disease, unspecified: Secondary | ICD-10-CM | POA: Diagnosis not present

## 2017-07-25 DIAGNOSIS — R300 Dysuria: Secondary | ICD-10-CM | POA: Diagnosis not present

## 2017-07-25 DIAGNOSIS — Z7901 Long term (current) use of anticoagulants: Secondary | ICD-10-CM | POA: Insufficient documentation

## 2017-07-25 DIAGNOSIS — I10 Essential (primary) hypertension: Secondary | ICD-10-CM | POA: Diagnosis not present

## 2017-07-25 LAB — URINALYSIS, ROUTINE W REFLEX MICROSCOPIC
Bilirubin Urine: NEGATIVE
Glucose, UA: NEGATIVE mg/dL
Hgb urine dipstick: NEGATIVE
Ketones, ur: NEGATIVE mg/dL
Leukocytes, UA: NEGATIVE
Nitrite: NEGATIVE
Protein, ur: NEGATIVE mg/dL
Specific Gravity, Urine: <1.005 — ABNORMAL LOW (ref 1.005–1.030)
pH: 6 (ref 5.0–8.0)

## 2017-07-25 MED ORDER — TAMSULOSIN HCL 0.4 MG PO CAPS: 0.4000 mg | ORAL_CAPSULE | Freq: Every day | ORAL | 0 refills | Status: DC

## 2017-07-25 NOTE — ED Notes (Signed)
ED Provider at bedside. 

## 2017-07-25 NOTE — Discharge Instructions (Signed)
Please read attached information. If you experience any new or worsening signs or symptoms please return to the emergency room for evaluation. Please follow-up with your primary care provider or specialist as discussed. Please use medication prescribed only as directed and discontinue taking if you have any concerning signs or symptoms.   °

## 2017-07-25 NOTE — ED Triage Notes (Signed)
Pt reports urinary frequency and dysuria x3 days. Reports constant pelvic pain with just a trickle of urine almost every time he voids.

## 2017-07-25 NOTE — ED Provider Notes (Signed)
[facility] EMERGENCY DEPARTMENT Provider Note   CSN: [id number] Arrival date & time: 07/25/17  7026     History   Chief Complaint Chief Complaint  Patient presents with  . Urinary Frequency    HPI Ryan [name] is a 71 y.o. male.  HPI   71 year old male presents today with complaints of urinary frequency.  Patient notes a significant past medical history of an enlarged prostate.  Patient notes that he takes no medications for this.  He notes frequent urinations, over the last 3 days he has had an increase in frequency, urgency, with dysuria.  Patient notes pain over his bladder, denies any other abdominal pain.  Patient denies any fever, nausea, vomiting, back pain, perineal/ rectal pain, testicular pain, or penile discharge.  Patient denies any significant history of prostate cancer.  Patient reports that he no longer has a urologist.  No recent antibiotic exposure.  She does note a past medical history of kidney stones, and reports that this does not feel like a kidney stone.    Past Medical History:  Diagnosis Date  . Anemia   . Atrial flutter ([place]) 03/31/2012   converted spontaneously to sinus  . CAD (coronary artery disease)    reportedly moderate CAD; managed medically  . COPD (chronic obstructive pulmonary disease) ([place])   . Eczema 08/03/2014  . GERD (gastroesophageal reflux disease)   . H/O: rheumatic fever   . Heart murmur   . Heart murmur   . High risk medication use    on amiodarone since 03/31/2012  . History of colonoscopy   . Hyperglycemia 06/21/2017  . Hyperlipidemia   . Hypertension   . Kidney stone 06/21/2017  . Kidney stones   . Low vitamin D level 06/21/2017  . Macular degeneration   . Osteoporosis 05/31/2016    Patient Active Problem List   Diagnosis Date Noted  . Low vitamin D level 06/21/2017  . Hyperglycemia 06/21/2017  . Kidney stone 06/21/2017  . 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 Osteoporosis 05/31/2016  . BPH (benign  prostatic hyperplasia) 01/20/2015  . Erectile dysfunction 01/20/2015  . Rectal bleeding 01/20/2015  . Medicare annual wellness visit, subsequent 01/20/2015  . Eczema 08/03/2014  . Sleep apnea 03/01/2014  . Anticoagulation adequate with anticoagulant therapy 03/01/2014  . Pre-syncope 02/26/2014  . Abdominal pain 02/26/2014  . PAD (peripheral artery disease) ([place]) 02/26/2014  . Chronic constipation 03/06/2013  . Easy bruising 03/06/2013  . History of alcohol abuse 04/15/2012  . Atrial flutter with rapid ventricular response ([place]) 03/31/2012  . CAD (coronary atherosclerotic disease) 05/08/2011  . 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 UNS ADVRS EFF UNS RX MEDICINAL&BIOLOGICAL SBSTNC 11/11/2009  . H/O tobacco use, presenting hazards to health 07/29/2009  . COPD GOLD I 07/29/2009  . Hyperlipidemia, mixed 06/20/2007  . Macular degeneration (senile) of retina 06/20/2007  . Essential hypertension 06/20/2007  . GERD 06/16/2007    Past Surgical History:  Procedure Laterality Date  . APPENDECTOMY    . CARDIAC CATHETERIZATION  04/22/2011   moderate left main and RCA stenosis not significant by FFR and IVUS on medical therapy  . CERVICAL DISCECTOMY     C5,6,7 disc fused  with plate and 5 1" screws  . CHOLECYSTECTOMY    . FOOT SURGERY     right calcification removed from top of foot  . knee cartiledge Right    right knee  . MOUTH SURGERY     periodontal  surgery, bridges, splint in front,        Home Medications    Prior to Admission medications   Medication Sig Start Date End Date Taking? Authorizing Provider  clobetasol (TEMOVATE) 0.05 % external solution APPLY 1 APPLICATION TOPICALLY TWICE DAILY 11/20/16  Yes Penni Homans A, MD  clobetasol (TEMOVATE) 0.05 % external solution APPLY 1 APPLICATION TOPICALLY TWICE DAILY 03/19/17  Yes [name], MD  ELIQUIS 5 MG TABS tablet TAKE 1 TABLET BY MOUTH TWO  TIMES DAILY 06/16/17   Yes Evans Lance, MD  ezetimibe (ZETIA) 10 MG tablet TAKE 1 TABLET BY MOUTH  DAILY WITH SUPPER 07/21/17  Yes [name], MD  latanoprost (XALATAN) 0.005 % ophthalmic solution Place 1 drop into the left eye at bedtime.    Yes [provider]  linaclotide (LINZESS) 72 MCG capsule Take 1 capsule by mouth daily. 02/02/17  Yes [provider]  metoprolol tartrate (LOPRESSOR) 25 MG tablet Take 0.5 tablets (12.5 mg total) by mouth 2 (two) times daily. 07/19/17 10/17/17 Yes Evans Lance, MD  Multiple Vitamins-Minerals (ICAPS MV) TABS Take 1 tablet by mouth 2 (two) times daily.    Yes [provider]  omeprazole (PRILOSEC OTC) 20 MG tablet Take 20 mg by mouth daily.   Yes [provider]  propafenone (RYTHMOL) 225 MG tablet Take 1 tablet (225 mg total) by mouth 2 (two) times daily. 06/21/17  Yes Evans Lance, MD  Ranibizumab (LUCENTIS IO) Inject into the eye as directed. (Macular Degeneration)   Yes [provider]  fluconazole (DIFLUCAN) 100 MG tablet Take 1 tablet (100 mg total) by mouth once a week. Take one by mouth 05/19/16   [name], MD  magic mouthwash w/lidocaine SOLN Take 5 mLs by mouth 4 (four) times daily as needed for mouth pain. 07/29/15   [name], MD  tamsulosin (FLOMAX) 0.4 MG CAPS capsule Take 1 capsule (0.4 mg total) by mouth daily. 07/25/17   Okey Regal, PA-C    Family History Family History  Problem Relation Age of Onset  . Heart disease Mother   . Diabetes Mother   . Cirrhosis Mother   . Emphysema Mother        never smoked but 2nd hand through her spouse  . Hypertension Mother   . Macular degeneration Mother   . Heart disease Father   . Cancer Father        prostate  . Hyperlipidemia Father   . Hypertension Father   . Varicose Veins Father   . Heart attack Father   . Peripheral vascular disease Father   . Heart disease Sister   . Arthritis Sister   . Hyperlipidemia Sister   . Obesity Sister   .  Macular degeneration Sister   . Heart disease Brother        5 stents  . Hyperlipidemia Brother   . Macular degeneration Maternal Grandfather   . Cirrhosis Sister   . Obesity Sister   . Arthritis Sister   . Heart disease Sister   . Obesity Sister   . Liver disease Unknown   . Prostate cancer Unknown   . Coronary artery disease Unknown     Social History Social History  Substance Use Topics  . Smoking status: Former Smoker    Packs/day: 0.50    Years: 50.00    Types: Cigarettes    Quit date: 05/01/2013  . Smokeless tobacco: Former Systems developer     Comment: using e-Cig//ldc  .  Alcohol use No     Allergies   Polymyxin b-trimethoprim; Codeine; Guaifenesin & derivatives; Statins; Avelox [moxifloxacin]; Guaifenesin; Mucinex d [pseudoephedrine-guaifenesin er]; Polymyxin b; Pseudoephedrine; Pseudoephedrine-guaifenesin; Rosuvastatin calcium; Simvastatin; Tapentadol; Vioxx [rofecoxib]; Ciprofloxacin; and Codeine phosphate   Review of Systems Review of Systems  All other systems reviewed and are negative.    Physical Exam Updated Vital Signs BP (!) 153/75 (BP Location: Right Arm)   Pulse (!) 59   Temp 98 F (36.7 C) (Oral)   Resp 18   Ht 5\' 5"  (1.651 m)   Wt 79.8 kg (176 lb)   SpO2 100%   BMI 29.29 kg/m   Physical Exam  Constitutional: He is oriented to person, place, and time. He appears well-developed and well-nourished.  HENT:  Head: Normocephalic and atraumatic.  Eyes: Pupils are equal, round, and reactive to light. Conjunctivae are normal. Right eye exhibits no discharge. Left eye exhibits no discharge. No scleral icterus.  Neck: Normal range of motion. No JVD present. No tracheal deviation present.  Pulmonary/Chest: Effort normal. No stridor.  Abdominal:  Minor TTP of the bladder- no distention noted, remained of abdominal exam normal- no CVA TTP  Neurological: He is alert and oriented to person, place, and time. Coordination normal.  Psychiatric: He has a normal mood  and affect. His behavior is normal. Judgment and thought content normal.  Nursing note and vitals reviewed.    ED Treatments / Results  Labs (all labs ordered are listed, but only abnormal results are displayed) Labs Reviewed  URINALYSIS, ROUTINE W REFLEX MICROSCOPIC - Abnormal; Notable for the following:       Result Value   Specific Gravity, Urine <1.005 (*)    All other components within normal limits  URINE CULTURE    EKG  EKG Interpretation None       Radiology No results found.  Procedures Procedures (including critical care time)  Medications Ordered in ED Medications - No data to display   Initial Impression / Assessment and Plan / ED Course  I have reviewed the triage vital signs and the nursing notes.  Pertinent labs & imaging results that were available during my care of the patient were reviewed by me and considered in my medical decision making (see chart for details).      Final Clinical Impressions(s) / ED Diagnoses   Final diagnoses:  Urinary frequency    Labs: UA/ urine culture  Imaging: bladder scan-   Consults:  Therapeutics:  Discharge Meds: Flomax   Assessment/Plan: 72 year old male presents today with complaints of urinary symptoms.  Bladder scan showed 60 mL's of urine post void.  Patient has a reassuring urinalysis with no significant signs of infection.  Patient's presentation is likely secondary to BPH worsening over time.  I have very low suspicion for acute bacterial prostatitis, or any other significant infectious etiology that require further evaluation or management here in the ED setting.  Patient will be referred to urology for evaluation and management.  He and strict return precautions, no further questions or concerns the time discharge.    New Prescriptions New Prescriptions   TAMSULOSIN (FLOMAX) 0.4 MG CAPS CAPSULE    Take 1 capsule (0.4 mg total) by mouth daily.     Okey Regal, PA-C 07/25/17 0957      Okey Regal, PA-C 07/25/17 1000    Little, Wenda Overland, MD 07/25/17 [phone]

## 2017-07-26 ENCOUNTER — Other Ambulatory Visit: Payer: Self-pay | Admitting: Family Medicine

## 2017-07-26 DIAGNOSIS — I10 Essential (primary) hypertension: Secondary | ICD-10-CM

## 2017-07-26 DIAGNOSIS — K625 Hemorrhage of anus and rectum: Secondary | ICD-10-CM

## 2017-07-26 DIAGNOSIS — B370 Candidal stomatitis: Secondary | ICD-10-CM

## 2017-07-26 DIAGNOSIS — K219 Gastro-esophageal reflux disease without esophagitis: Secondary | ICD-10-CM

## 2017-07-26 LAB — URINE CULTURE: Culture: NO GROWTH

## 2017-07-26 MED ORDER — FLUCONAZOLE 100 MG PO TABS: 100.0000 mg | ORAL_TABLET | ORAL | 5 refills | Status: DC

## 2017-08-06 ENCOUNTER — Other Ambulatory Visit: Payer: Self-pay

## 2017-08-06 MED ORDER — EZETIMIBE 10 MG PO TABS: ORAL_TABLET | ORAL | 1 refills | Status: DC

## 2017-08-07 ENCOUNTER — Emergency Department (HOSPITAL_COMMUNITY)
Admission: EM | Admit: 2017-08-07 | Discharge: 2017-08-07 | Disposition: A | Discharge status: Home / Self Care | Payer: Medicare Other | Attending: Emergency Medicine | Admitting: Emergency Medicine

## 2017-08-07 ENCOUNTER — Encounter (HOSPITAL_COMMUNITY): Payer: Self-pay

## 2017-08-07 DIAGNOSIS — N419 Inflammatory disease of prostate, unspecified: Secondary | ICD-10-CM | POA: Insufficient documentation

## 2017-08-07 DIAGNOSIS — I10 Essential (primary) hypertension: Secondary | ICD-10-CM | POA: Diagnosis not present

## 2017-08-07 DIAGNOSIS — Z79899 Other long term (current) drug therapy: Secondary | ICD-10-CM | POA: Diagnosis not present

## 2017-08-07 DIAGNOSIS — J449 Chronic obstructive pulmonary disease, unspecified: Secondary | ICD-10-CM | POA: Insufficient documentation

## 2017-08-07 DIAGNOSIS — Z87891 Personal history of nicotine dependence: Secondary | ICD-10-CM | POA: Diagnosis not present

## 2017-08-07 DIAGNOSIS — I2510 Atherosclerotic heart disease of native coronary artery without angina pectoris: Secondary | ICD-10-CM | POA: Insufficient documentation

## 2017-08-07 DIAGNOSIS — N410 Acute prostatitis: Secondary | ICD-10-CM | POA: Diagnosis not present

## 2017-08-07 DIAGNOSIS — R339 Retention of urine, unspecified: Secondary | ICD-10-CM | POA: Diagnosis present

## 2017-08-07 LAB — BASIC METABOLIC PANEL
Anion gap: 8 (ref 5–15)
BUN: 10 mg/dL (ref 6–20)
CO2: 24 mmol/L (ref 22–32)
Calcium: 8.7 mg/dL — ABNORMAL LOW (ref 8.9–10.3)
Chloride: 103 mmol/L (ref 101–111)
Creatinine, Ser: 0.9 mg/dL (ref 0.61–1.24)
GFR calc Af Amer: >60 mL/min (ref >60)
GFR calc non Af Amer: >60 mL/min (ref >60)
Glucose, Bld: 95 mg/dL (ref 65–99)
Potassium: 3.7 mmol/L (ref 3.5–5.1)
Sodium: 135 mmol/L (ref 135–145)

## 2017-08-07 LAB — CBC WITH DIFFERENTIAL/PLATELET
Basophils Absolute: 0.1 10*3/uL (ref 0.0–0.1)
Basophils Relative: 1 %
Eosinophils Absolute: 0.1 10*3/uL (ref 0.0–0.7)
Eosinophils Relative: 1 %
HCT: 36.4 % — ABNORMAL LOW (ref 39.0–52.0)
Hemoglobin: 12.7 g/dL — ABNORMAL LOW (ref 13.0–17.0)
Lymphocytes Relative: 28 %
Lymphs Abs: 2.3 10*3/uL (ref 0.7–4.0)
MCH: 31 pg (ref 26.0–34.0)
MCHC: 34.9 g/dL (ref 30.0–36.0)
MCV: 88.8 fL (ref 78.0–100.0)
Monocytes Absolute: 0.6 10*3/uL (ref 0.1–1.0)
Monocytes Relative: 8 %
Neutro Abs: 5.1 10*3/uL (ref 1.7–7.7)
Neutrophils Relative %: 62 %
Platelets: 192 10*3/uL (ref 150–400)
RBC: 4.1 MIL/uL — ABNORMAL LOW (ref 4.22–5.81)
RDW: 12.9 % (ref 11.5–15.5)
WBC: 8.3 10*3/uL (ref 4.0–10.5)

## 2017-08-07 LAB — URINALYSIS, ROUTINE W REFLEX MICROSCOPIC
Bilirubin Urine: NEGATIVE
Glucose, UA: NEGATIVE mg/dL
Hgb urine dipstick: NEGATIVE
Ketones, ur: NEGATIVE mg/dL
Leukocytes, UA: NEGATIVE
Nitrite: NEGATIVE
Protein, ur: NEGATIVE mg/dL
Specific Gravity, Urine: 1.016 (ref 1.005–1.030)
pH: 6 (ref 5.0–8.0)

## 2017-08-07 MED ORDER — HYDROCODONE-ACETAMINOPHEN 5-325 MG PO TABS: 1.0000 | ORAL_TABLET | Freq: Four times a day (QID) | ORAL | 0 refills | Status: DC | PRN

## 2017-08-07 MED ORDER — HYDROCODONE-ACETAMINOPHEN 5-325 MG PO TABS
1.0000 | ORAL_TABLET | Freq: Once | ORAL | Status: AC
  Administered 2017-08-07: 1 via ORAL
  Filled 2017-08-07: qty 1

## 2017-08-07 MED ORDER — CIPROFLOXACIN HCL 500 MG PO TABS: ORAL_TABLET | ORAL | 0 refills | Status: DC

## 2017-08-07 NOTE — ED Notes (Signed)
Pt bladder scanned.  Bladder scan consistently reports 24ml.  Informed Jenny Reichmann, RN.

## 2017-08-07 NOTE — ED Triage Notes (Signed)
Pt having to urinate every hour very small amounts, blood in semen, nausea.  Pt seen Med center 2 weeks ago, was started on Flomax and has appt with urology in a week from now.  Pt unable to wait that long d/t pain.

## 2017-08-07 NOTE — Discharge Instructions (Signed)
It was my pleasure taking care of you today!   Please take all of your antibiotics until finished!  Please keep scheduled appointment with the urologist. Let them know about today's hospital visit.   Return to ER for new or worsening symptoms, any additional concerns.

## 2017-08-07 NOTE — ED Notes (Signed)
Notified MD of low volume results of bladder scan.

## 2017-08-07 NOTE — ED Provider Notes (Signed)
[facility] EMERGENCY DEPARTMENT Provider Note   CSN: [id number] Arrival date & time: 08/07/17  1345     History   Chief Complaint Chief Complaint  Patient presents with  . Urinary Retention    HPI Ryan [name] is a 71 y.o. male.  The history is provided by the patient and medical records. No language interpreter was used.   Ryan [name] is a 71 y.o. male  with a PMH of BPH who presents to the Emergency Department complaining of urinary frequency. Patient states that he will have to use the restroom every hour, but when he does try to go, very Ryan urine will come out. Associated with 10/10 suprapubic pain. He was seen in the department on 10/21 at [facility] on 10/21 for same where he was started on Flomax. He notes about 7/10 pain at that time. He states that this helped for about a week, but then his symptoms worsened. No fevers, chills, nausea, vomiting or back pain. He has an appointment with the urologist in 8 days, but felt like he could not wait for that appointment given amount of pain.    Past Medical History:  Diagnosis Date  . Anemia   . Atrial flutter ([place]) 03/31/2012   converted spontaneously to sinus  . CAD (coronary artery disease)    reportedly moderate CAD; managed medically  . COPD (chronic obstructive pulmonary disease) ([place])   . Eczema 08/03/2014  . GERD (gastroesophageal reflux disease)   . H/O: rheumatic fever   . Heart murmur   . Heart murmur   . High risk medication use    on amiodarone since 03/31/2012  . History of colonoscopy   . Hyperglycemia 06/21/2017  . Hyperlipidemia   . Hypertension   . Kidney stone 06/21/2017  . Kidney stones   . Low vitamin D level 06/21/2017  . Macular degeneration   . Osteoporosis 05/31/2016    Patient Active Problem List   Diagnosis Date Noted  . Low vitamin D level 06/21/2017  . Hyperglycemia 06/21/2017  . Kidney stone 06/21/2017  . 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  Osteoporosis 05/31/2016  . BPH (benign prostatic hyperplasia) 01/20/2015  . Erectile dysfunction 01/20/2015  . Rectal bleeding 01/20/2015  . Medicare annual wellness visit, subsequent 01/20/2015  . Eczema 08/03/2014  . Sleep apnea 03/01/2014  . Anticoagulation adequate with anticoagulant therapy 03/01/2014  . Pre-syncope 02/26/2014  . Abdominal pain 02/26/2014  . PAD (peripheral artery disease) ([place]) 02/26/2014  . Chronic constipation 03/06/2013  . Easy bruising 03/06/2013  . History of alcohol abuse 04/15/2012  . Atrial flutter with rapid ventricular response ([place]) 03/31/2012  . CAD (coronary atherosclerotic disease) 05/08/2011  . 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 UNS ADVRS EFF UNS RX MEDICINAL&BIOLOGICAL SBSTNC 11/11/2009  . H/O tobacco use, presenting hazards to health 07/29/2009  . COPD GOLD I 07/29/2009  . Hyperlipidemia, mixed 06/20/2007  . Macular degeneration (senile) of retina 06/20/2007  . Essential hypertension 06/20/2007  . GERD 06/16/2007    Past Surgical History:  Procedure Laterality Date  . APPENDECTOMY    . CARDIAC CATHETERIZATION  04/22/2011   moderate left main and RCA stenosis not significant by FFR and IVUS on medical therapy  . CERVICAL DISCECTOMY     C5,6,7 disc fused  with plate and 5 1" screws  . CHOLECYSTECTOMY    . FOOT SURGERY     right calcification removed from top of foot  .  knee cartiledge Right    right knee  . MOUTH SURGERY     periodontal surgery, bridges, splint in front,        Home Medications    Prior to Admission medications   Medication Sig Start Date End Date Taking? Authorizing Provider  clobetasol (TEMOVATE) 0.05 % external solution APPLY 1 APPLICATION TOPICALLY TWICE DAILY 11/20/16   [name], MD  clobetasol (TEMOVATE) 0.05 % external solution APPLY 1 APPLICATION TOPICALLY TWICE DAILY 03/19/17   [name], MD  ELIQUIS 5 MG TABS tablet TAKE 1 TABLET  BY MOUTH TWO  TIMES DAILY 06/16/17   Evans Lance, MD  ezetimibe (ZETIA) 10 MG tablet TAKE 1 TABLET BY MOUTH  DAILY WITH SUPPER 08/06/17   [name], MD  fluconazole (DIFLUCAN) 100 MG tablet Take 1 tablet (100 mg total) by mouth once a week. Take one by mouth 07/26/17   [name], MD  latanoprost (XALATAN) 0.005 % ophthalmic solution Place 1 drop into the left eye at bedtime.     [provider]  linaclotide (LINZESS) 72 MCG capsule Take 1 capsule by mouth daily. 02/02/17   [provider]  magic mouthwash w/lidocaine SOLN Take 5 mLs by mouth 4 (four) times daily as needed for mouth pain. 07/29/15   [name], MD  metoprolol tartrate (LOPRESSOR) 25 MG tablet Take 0.5 tablets (12.5 mg total) by mouth 2 (two) times daily. 07/19/17 10/17/17  Evans Lance, MD  Multiple Vitamins-Minerals (ICAPS MV) TABS Take 1 tablet by mouth 2 (two) times daily.     [provider]  omeprazole (PRILOSEC OTC) 20 MG tablet Take 20 mg by mouth daily.    [provider]  propafenone (RYTHMOL) 225 MG tablet Take 1 tablet (225 mg total) by mouth 2 (two) times daily. 06/21/17   Evans Lance, MD  Ranibizumab (LUCENTIS IO) Inject into the eye as directed. (Macular Degeneration)    [provider]  tamsulosin (FLOMAX) 0.4 MG CAPS capsule Take 1 capsule (0.4 mg total) by mouth daily. 07/25/17   Okey Regal, PA-C    Family History Family History  Problem Relation Age of Onset  . Heart disease Mother   . Diabetes Mother   . Cirrhosis Mother   . Emphysema Mother        never smoked but 2nd hand through her spouse  . Hypertension Mother   . Macular degeneration Mother   . Heart disease Father   . Cancer Father        prostate  . Hyperlipidemia Father   . Hypertension Father   . Varicose Veins Father   . Heart attack Father   . Peripheral vascular disease Father   . Heart disease Sister   . Arthritis Sister   . Hyperlipidemia Sister   . Obesity  Sister   . Macular degeneration Sister   . Heart disease Brother        5 stents  . Hyperlipidemia Brother   . Macular degeneration Maternal Grandfather   . Cirrhosis Sister   . Obesity Sister   . Arthritis Sister   . Heart disease Sister   . Obesity Sister   . Liver disease Unknown   . Prostate cancer Unknown   . Coronary artery disease Unknown     Social History Social History  Substance Use Topics  . Smoking status: Former Smoker    Packs/day: 0.50    Years: 50.00    Types: Cigarettes  Quit date: 05/01/2013  . Smokeless tobacco: Former Systems developer     Comment: using e-Cig//ldc  . Alcohol use No     Allergies   Polymyxin b-trimethoprim; Codeine; Guaifenesin & derivatives; Statins; Avelox [moxifloxacin]; Guaifenesin; Mucinex d [pseudoephedrine-guaifenesin er]; Polymyxin b; Pseudoephedrine; Pseudoephedrine-guaifenesin; Rosuvastatin calcium; Simvastatin; Tapentadol; Vioxx [rofecoxib]; Ciprofloxacin; and Codeine phosphate   Review of Systems Review of Systems  Gastrointestinal: Positive for abdominal pain (Suprapubic). Negative for nausea and vomiting.  Genitourinary: Positive for difficulty urinating and frequency.  All other systems reviewed and are negative.    Physical Exam Updated Vital Signs BP (!) 159/77   Pulse 73   Temp 97.6 F (36.4 C) (Oral)   Resp 16   SpO2 96%   Physical Exam  Constitutional: He is oriented to person, place, and time. He appears well-developed and well-nourished. No distress.  Non-toxic appearing.   HENT:  Head: Normocephalic and atraumatic.  Cardiovascular: Normal rate, regular rhythm and normal heart sounds.   No murmur heard. Pulmonary/Chest: Effort normal and breath sounds normal. No respiratory distress.  Abdominal: Soft. He exhibits no distension. There is tenderness (Suprapubic). There is no rebound and no guarding.  Musculoskeletal: He exhibits no edema.  Neurological: He is alert and oriented to person, place, and time.    Skin: Skin is warm and dry.  Nursing note and vitals reviewed.    ED Treatments / Results  Labs (all labs ordered are listed, but only abnormal results are displayed) Labs Reviewed  BASIC METABOLIC PANEL - Abnormal; Notable for the following:       Result Value   Calcium 8.7 (*)    All other components within normal limits  CBC WITH DIFFERENTIAL/PLATELET - Abnormal; Notable for the following:    RBC 4.10 (*)    Hemoglobin 12.7 (*)    HCT 36.4 (*)    All other components within normal limits  URINALYSIS, ROUTINE W REFLEX MICROSCOPIC    EKG  EKG Interpretation None       Radiology No results found.  Procedures Procedures (including critical care time)  Medications Ordered in ED Medications - No data to display   Initial Impression / Assessment and Plan / ED Course  I have reviewed the triage vital signs and the nursing notes.  Pertinent labs & imaging results that were available during my care of the patient were reviewed by me and considered in my medical decision making (see chart for details).    Ryan [name] is a 71 y.o. male who presents to ED for suprapubic pain and difficulty urinating. He has known hx of BPH. He was seen on 10/21 for same where he was started on Flomax. He notes this improved symptoms for about a week, but symptoms have now returned. On exam, patient with tenderness just to the low suprapubic region. No back pain. Afebrile, hemodynamically stable. Bedside ultrasound performed by attending, Dr. Leonette Monarch, with approx. 60mL's. Do not feel foley catheter placement will be of benefit given low volume in post-void residual. Rectal exam with tenderness to the prostate. Will start on cipro for possible prostatitis as cause of symptoms. Patient has a follow up appointment with urology in 1 week. Encouraged to keep this scheduled appointment. He plans on calling in the morning to see if appointment could be moved up. Reasons to return to ER discussed  with patient and wife at bedside. All questions answered.   Patient seen by and discussed with Dr. Leonette Monarch who agrees with treatment plan.    Final Clinical  Impressions(s) / ED Diagnoses   Final diagnoses:  Prostatitis, unspecified prostatitis type    New Prescriptions New Prescriptions   No medications on file     Suzan Manon, Ozella Almond, PA-C 08/07/17 1903    Fatima Blank, MD 08/08/17 [phone]

## 2017-08-16 DIAGNOSIS — R1031 Right lower quadrant pain: Secondary | ICD-10-CM | POA: Diagnosis not present

## 2017-08-30 DIAGNOSIS — I4891 Unspecified atrial fibrillation: Secondary | ICD-10-CM | POA: Diagnosis not present

## 2017-08-30 DIAGNOSIS — K5904 Chronic idiopathic constipation: Secondary | ICD-10-CM | POA: Diagnosis not present

## 2017-08-30 DIAGNOSIS — K219 Gastro-esophageal reflux disease without esophagitis: Secondary | ICD-10-CM | POA: Diagnosis not present

## 2017-08-30 DIAGNOSIS — Z7901 Long term (current) use of anticoagulants: Secondary | ICD-10-CM | POA: Diagnosis not present

## 2017-08-30 DIAGNOSIS — F17210 Nicotine dependence, cigarettes, uncomplicated: Secondary | ICD-10-CM | POA: Diagnosis not present

## 2017-09-07 ENCOUNTER — Ambulatory Visit (INDEPENDENT_AMBULATORY_CARE_PROVIDER_SITE_OTHER): Payer: Medicare Other | Admitting: Internal Medicine

## 2017-09-07 ENCOUNTER — Encounter: Payer: Self-pay | Admitting: Internal Medicine

## 2017-09-07 VITALS — BP 146/78 | HR 55 | Ht 65.0 in | Wt 179.0 lb

## 2017-09-07 DIAGNOSIS — I480 Paroxysmal atrial fibrillation: Secondary | ICD-10-CM

## 2017-09-07 DIAGNOSIS — I10 Essential (primary) hypertension: Secondary | ICD-10-CM | POA: Diagnosis not present

## 2017-09-07 DIAGNOSIS — I5032 Chronic diastolic (congestive) heart failure: Secondary | ICD-10-CM

## 2017-09-07 NOTE — Patient Instructions (Signed)

## 2017-09-07 NOTE — Progress Notes (Signed)
HPI Ryan [name] returns today for ongoing evaluation and management of paroxysmal atrial fibrillation, hypertension, COPD, and diastolic heart failure. In the interim, he has not had chest pain or sob or symptomatic atrial fib. He is frustrated by his inability not to gain weight and thinks that his rhythmol is the culprit.  Allergies  Allergen Reactions  . Guaifenesin Diarrhea and Anaphylaxis  . Polymyxin B-Trimethoprim Swelling    Eye drops made eyes swell  . Codeine Rash, Itching and Hives  . Guaifenesin & Derivatives     Other reaction(s): Hallucinations  . Statins Other (See Comments)    Muscle aches, Simvastatin,Atorvstatin,   . Avelox [Moxifloxacin] Other (See Comments)    Stomach cramps  . Mucinex D [Pseudoephedrine-Guaifenesin Er] Nausea And Vomiting    Stomach cramps  . Polymyxin B Other (See Comments)    Swelling Eye swelling   . Pseudoephedrine Hives    Stomach cramps  . Pseudoephedrine-Guaifenesin Nausea And Vomiting    Stomach cramps  . Rosuvastatin Calcium Other (See Comments)    Muscle aches  . Simvastatin Other (See Comments)    Muscle aches and cramps  . Tapentadol Other (See Comments)    unknown  . Vioxx [Rofecoxib] Other (See Comments)    Stomach cramping   . Codeine Phosphate Itching and Rash     Current Outpatient Medications  Medication Sig Dispense Refill  . clobetasol (TEMOVATE) 0.05 % external solution APPLY 1 APPLICATION TOPICALLY TWICE DAILY 100 mL 3  . ELIQUIS 5 MG TABS tablet TAKE 1 TABLET BY MOUTH TWO  TIMES DAILY 180 tablet 3  . ezetimibe (ZETIA) 10 MG tablet TAKE 1 TABLET BY MOUTH  DAILY WITH SUPPER 90 tablet 1  . fluconazole (DIFLUCAN) 100 MG tablet Take 1 tablet (100 mg total) by mouth once a week. Take one by mouth 4 tablet 5  . latanoprost (XALATAN) 0.005 % ophthalmic solution Place 1 drop into the left eye at bedtime.     Marland Kitchen linaclotide (LINZESS) 72 MCG capsule Take 1 capsule by mouth daily.    . metoprolol tartrate  (LOPRESSOR) 25 MG tablet Take 0.5 tablets (12.5 mg total) by mouth 2 (two) times daily. 90 tablet 0  . Multiple Vitamins-Minerals (ICAPS MV) TABS Take 1 tablet by mouth 2 (two) times daily.     Marland Kitchen omeprazole (PRILOSEC OTC) 20 MG tablet Take 20 mg by mouth daily.    . propafenone (RYTHMOL) 225 MG tablet Take 1 tablet (225 mg total) by mouth 2 (two) times daily. 180 tablet 3  . Ranibizumab (LUCENTIS IO) Inject into the eye as directed. (Macular Degeneration)    . tamsulosin (FLOMAX) 0.4 MG CAPS capsule Take 1 capsule (0.4 mg total) by mouth daily. 30 capsule 0   No current facility-administered medications for this visit.      Past Medical History:  Diagnosis Date  . Anemia   . Atrial flutter ([place]) 03/31/2012   converted spontaneously to sinus  . CAD (coronary artery disease)    reportedly moderate CAD; managed medically  . COPD (chronic obstructive pulmonary disease) ([place])   . Eczema 08/03/2014  . GERD (gastroesophageal reflux disease)   . H/O: rheumatic fever   . Heart murmur   . Heart murmur   . High risk medication use    on amiodarone since 03/31/2012  . History of colonoscopy   . Hyperglycemia 06/21/2017  . Hyperlipidemia   . Hypertension   . Kidney stone 06/21/2017  . Kidney stones   .  Low vitamin D level 06/21/2017  . Macular degeneration   . Osteoporosis 05/31/2016    ROS:   All systems reviewed and negative except as noted in the HPI.   Past Surgical History:  Procedure Laterality Date  . APPENDECTOMY    . CARDIAC CATHETERIZATION  04/22/2011   moderate left main and RCA stenosis not significant by FFR and IVUS on medical therapy  . CERVICAL DISCECTOMY     C5,6,7 disc fused  with plate and 5 1" screws  . CHOLECYSTECTOMY    . FOOT SURGERY     right calcification removed from top of foot  . knee cartiledge Right    right knee  . MOUTH SURGERY     periodontal surgery, bridges, splint in front,      Family History  Problem Relation Age of Onset  . Heart disease  Mother   . Diabetes Mother   . Cirrhosis Mother   . Emphysema Mother        never smoked but 2nd hand through her spouse  . Hypertension Mother   . Macular degeneration Mother   . Heart disease Father   . Cancer Father        prostate  . Hyperlipidemia Father   . Hypertension Father   . Varicose Veins Father   . Heart attack Father   . Peripheral vascular disease Father   . Heart disease Sister   . Arthritis Sister   . Hyperlipidemia Sister   . Obesity Sister   . Macular degeneration Sister   . Heart disease Brother        5 stents  . Hyperlipidemia Brother   . Macular degeneration Maternal Grandfather   . Cirrhosis Sister   . Obesity Sister   . Arthritis Sister   . Heart disease Sister   . Obesity Sister   . Liver disease Unknown   . Prostate cancer Unknown   . Coronary artery disease Unknown      Social History   Socioeconomic History  . Marital status: Married    Spouse name: Not on file  . Number of children: 0  . Years of education: Not on file  . Highest education level: Not on file  Social Needs  . Financial resource strain: Not on file  . Food insecurity - worry: Not on file  . Food insecurity - inability: Not on file  . Transportation needs - medical: Not on file  . Transportation needs - non-medical: Not on file  Occupational History  . Occupation: retired    Fish farm manager: RETIRED  Tobacco Use  . Smoking status: Former Smoker    Packs/day: 0.50    Years: 50.00    Pack years: 25.00    Types: Cigarettes    Last attempt to quit: 05/01/2013    Years since quitting: 4.3  . Smokeless tobacco: Former Systems developer  . Tobacco comment: using e-Cig//ldc  Substance and Sexual Activity  . Alcohol use: No  . Drug use: No  . Sexual activity: Yes    Comment: lives with wife, no dietary restrictions  Other Topics Concern  . Not on file  Social History Narrative  . Not on file     BP (!) 146/78   Pulse (!) 55   Ht 5\' 5"  (1.651 m)   Wt 179 lb (81.2 kg)   SpO2  98%   BMI 29.79 kg/m   Physical Exam:  Well appearing 71 yo man, NAD HEENT: Unremarkable Neck:  6 cm JVD, no thyromegally  Lymphatics:  No adenopathy Back:  No CVA tenderness Lungs:  Clear with no wheezes HEART:  Regular rate rhythm, no murmurs, no rubs, no clicks Abd:  soft, positive bowel sounds, no organomegally, no rebound, no guarding Ext:  2 plus pulses, no edema, no cyanosis, no clubbing Skin:  No rashes no nodules Neuro:  CN II through XII intact, motor grossly intact  EKG - NSR  Assess/Plan: 1. PAF - he is maintaining NSR very nicely on rhythmol. He will continue this medication. No indication for catheter ablation at this point. 2. HTN - his blood pressure is up a bit but he notes that he did not take him meds this morning and that it normally is well controlled. 3. Diastolic heart failure - he is class 1. He will continue his current meds.  Mikle Bosworth.D.

## 2017-09-09 DIAGNOSIS — H35361 Drusen (degenerative) of macula, right eye: Secondary | ICD-10-CM | POA: Diagnosis not present

## 2017-09-09 DIAGNOSIS — H353221 Exudative age-related macular degeneration, left eye, with active choroidal neovascularization: Secondary | ICD-10-CM | POA: Diagnosis not present

## 2017-09-09 DIAGNOSIS — H35372 Puckering of macula, left eye: Secondary | ICD-10-CM | POA: Diagnosis not present

## 2017-09-09 DIAGNOSIS — H353110 Nonexudative age-related macular degeneration, right eye, stage unspecified: Secondary | ICD-10-CM | POA: Diagnosis not present

## 2017-09-22 ENCOUNTER — Other Ambulatory Visit: Payer: Self-pay | Admitting: Internal Medicine

## 2017-09-22 DIAGNOSIS — I4891 Unspecified atrial fibrillation: Secondary | ICD-10-CM

## 2017-09-22 MED ORDER — PROPAFENONE HCL 225 MG PO TABS: 225.0000 mg | ORAL_TABLET | Freq: Two times a day (BID) | ORAL | 3 refills | Status: DC

## 2017-10-11 ENCOUNTER — Emergency Department (HOSPITAL_COMMUNITY): Payer: Medicare Other

## 2017-10-11 ENCOUNTER — Other Ambulatory Visit: Payer: Self-pay

## 2017-10-11 ENCOUNTER — Emergency Department (HOSPITAL_COMMUNITY)
Admission: EM | Admit: 2017-10-11 | Discharge: 2017-10-11 | Disposition: A | Discharge status: Home / Self Care | Payer: Medicare Other | Attending: Emergency Medicine | Admitting: Emergency Medicine

## 2017-10-11 ENCOUNTER — Encounter (HOSPITAL_COMMUNITY): Payer: Self-pay

## 2017-10-11 DIAGNOSIS — Z79899 Other long term (current) drug therapy: Secondary | ICD-10-CM | POA: Diagnosis not present

## 2017-10-11 DIAGNOSIS — K529 Noninfective gastroenteritis and colitis, unspecified: Secondary | ICD-10-CM | POA: Insufficient documentation

## 2017-10-11 DIAGNOSIS — E876 Hypokalemia: Secondary | ICD-10-CM | POA: Diagnosis not present

## 2017-10-11 DIAGNOSIS — R0602 Shortness of breath: Secondary | ICD-10-CM | POA: Diagnosis not present

## 2017-10-11 DIAGNOSIS — J449 Chronic obstructive pulmonary disease, unspecified: Secondary | ICD-10-CM | POA: Insufficient documentation

## 2017-10-11 DIAGNOSIS — Z87891 Personal history of nicotine dependence: Secondary | ICD-10-CM | POA: Diagnosis not present

## 2017-10-11 DIAGNOSIS — R1084 Generalized abdominal pain: Secondary | ICD-10-CM | POA: Diagnosis not present

## 2017-10-11 DIAGNOSIS — I2510 Atherosclerotic heart disease of native coronary artery without angina pectoris: Secondary | ICD-10-CM | POA: Insufficient documentation

## 2017-10-11 DIAGNOSIS — N200 Calculus of kidney: Secondary | ICD-10-CM | POA: Diagnosis not present

## 2017-10-11 DIAGNOSIS — I10 Essential (primary) hypertension: Secondary | ICD-10-CM | POA: Insufficient documentation

## 2017-10-11 LAB — COMPREHENSIVE METABOLIC PANEL
ALT: 14 U/L — ABNORMAL LOW (ref 17–63)
AST: 17 U/L (ref 15–41)
Albumin: 3.8 g/dL (ref 3.5–5.0)
Alkaline Phosphatase: 54 U/L (ref 38–126)
Anion gap: 9 (ref 5–15)
BUN: 8 mg/dL (ref 6–20)
CO2: 24 mmol/L (ref 22–32)
Calcium: 8.6 mg/dL — ABNORMAL LOW (ref 8.9–10.3)
Chloride: 106 mmol/L (ref 101–111)
Creatinine, Ser: 0.76 mg/dL (ref 0.61–1.24)
GFR calc Af Amer: >60 mL/min (ref >60)
GFR calc non Af Amer: >60 mL/min (ref >60)
Glucose, Bld: 94 mg/dL (ref 65–99)
Potassium: 3.1 mmol/L — ABNORMAL LOW (ref 3.5–5.1)
Sodium: 139 mmol/L (ref 135–145)
Total Bilirubin: 1.5 mg/dL — ABNORMAL HIGH (ref 0.3–1.2)
Total Protein: 6.9 g/dL (ref 6.5–8.1)

## 2017-10-11 LAB — CBC
HCT: 36.9 % — ABNORMAL LOW (ref 39.0–52.0)
Hemoglobin: 13.1 g/dL (ref 13.0–17.0)
MCH: 31.5 pg (ref 26.0–34.0)
MCHC: 35.5 g/dL (ref 30.0–36.0)
MCV: 88.7 fL (ref 78.0–100.0)
Platelets: 209 10*3/uL (ref 150–400)
RBC: 4.16 MIL/uL — ABNORMAL LOW (ref 4.22–5.81)
RDW: 13.2 % (ref 11.5–15.5)
WBC: 9.4 10*3/uL (ref 4.0–10.5)

## 2017-10-11 LAB — TYPE AND SCREEN
ABO/RH(D): O POS
Antibody Screen: NEGATIVE

## 2017-10-11 LAB — MAGNESIUM: Magnesium: 0.8 mg/dL — CL (ref 1.7–2.4)

## 2017-10-11 LAB — LIPASE, BLOOD: Lipase: 27 U/L (ref 11–51)

## 2017-10-11 MED ORDER — POTASSIUM CHLORIDE ER 10 MEQ PO TBCR: 20.0000 meq | EXTENDED_RELEASE_TABLET | Freq: Two times a day (BID) | ORAL | 0 refills | Status: DC

## 2017-10-11 MED ORDER — AZITHROMYCIN 250 MG PO TABS: 250.0000 mg | ORAL_TABLET | Freq: Every day | ORAL | 0 refills | Status: DC

## 2017-10-11 MED ORDER — MAGNESIUM OXIDE 400 (241.3 MG) MG PO TABS: 400.0000 mg | ORAL_TABLET | Freq: Every day | ORAL | 0 refills | Status: AC

## 2017-10-11 MED ORDER — DICYCLOMINE HCL 20 MG PO TABS: 20.0000 mg | ORAL_TABLET | Freq: Two times a day (BID) | ORAL | 0 refills | Status: DC

## 2017-10-11 MED ORDER — DICYCLOMINE HCL 10 MG/ML IM SOLN
20.0000 mg | Freq: Once | INTRAMUSCULAR | Status: AC
  Administered 2017-10-11: 20 mg via INTRAMUSCULAR
  Filled 2017-10-11: qty 2

## 2017-10-11 MED ORDER — IOPAMIDOL (ISOVUE-370) INJECTION 76%
INTRAVENOUS | Status: AC
  Administered 2017-10-11: 100 mL
  Filled 2017-10-11: qty 100

## 2017-10-11 MED ORDER — SODIUM CHLORIDE 0.9 % IV BOLUS (SEPSIS)
1000.0000 mL | Freq: Once | INTRAVENOUS | Status: AC
  Administered 2017-10-11: 1000 mL via INTRAVENOUS

## 2017-10-11 MED ORDER — ONDANSETRON HCL 4 MG/2ML IJ SOLN
4.0000 mg | Freq: Once | INTRAMUSCULAR | Status: AC
  Administered 2017-10-11: 4 mg via INTRAVENOUS
  Filled 2017-10-11: qty 2

## 2017-10-11 MED ORDER — ONDANSETRON 4 MG PO TBDP: 4.0000 mg | ORAL_TABLET | Freq: Three times a day (TID) | ORAL | 0 refills | Status: DC | PRN

## 2017-10-11 MED ORDER — POTASSIUM CHLORIDE CRYS ER 20 MEQ PO TBCR
40.0000 meq | EXTENDED_RELEASE_TABLET | Freq: Once | ORAL | Status: AC
  Administered 2017-10-11: 40 meq via ORAL
  Filled 2017-10-11: qty 2

## 2017-10-11 MED ORDER — MAGNESIUM SULFATE 2 GM/50ML IV SOLN
2.0000 g | Freq: Once | INTRAVENOUS | Status: AC
  Administered 2017-10-11: 2 g via INTRAVENOUS

## 2017-10-11 NOTE — ED Provider Notes (Signed)
[facility] DEPT Provider Note   CSN: [id number] Arrival date & time: 10/11/17  1022     History   Chief Complaint Chief Complaint  Patient presents with  . Abdominal Pain  . Rectal Bleeding  . Bloated    HPI Sueo Cullen is a 72 y.o. male.  HPI   Diarrhea since Saturday, on eliquis Dr. Earlean Shawl (his GI Dr., Danelle Earthly) Has hx of occult blood in stool, had prior "mucus colitis" which responded to prilosec Had loose stools for one week, but since Saturday has had severe diarrhea, bloody diarrhea.   After eating, has severe cramping periumbilical pain then generalizes, no localization of pain Diarrhea has been every 2 hours since Saturday Chronically sees blood in stool but worse on Saturday, saw bright red blood on tissue paper,  Was also mixed into stool.  This morning had dark brown stool, loose, with small amount of blood mixed in. No known sick contacts No recent abx  Past Medical History:  Diagnosis Date  . Anemia   . Atrial flutter ([place]) 03/31/2012   converted spontaneously to sinus  . CAD (coronary artery disease)    reportedly moderate CAD; managed medically  . COPD (chronic obstructive pulmonary disease) ([place])   . Eczema 08/03/2014  . GERD (gastroesophageal reflux disease)   . H/O: rheumatic fever   . Heart murmur   . Heart murmur   . High risk medication use    on amiodarone since 03/31/2012  . History of colonoscopy   . Hyperglycemia 06/21/2017  . Hyperlipidemia   . Hypertension   . Kidney stone 06/21/2017  . Kidney stones   . Low vitamin D level 06/21/2017  . Macular degeneration   . Osteoporosis 05/31/2016    Patient Active Problem List   Diagnosis Date Noted  . Low vitamin D level 06/21/2017  . Hyperglycemia 06/21/2017  . Kidney stone 06/21/2017  . 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 Osteoporosis 05/31/2016  . BPH (benign prostatic hyperplasia) 01/20/2015  . Erectile dysfunction 01/20/2015  . Rectal bleeding 01/20/2015  .  Medicare annual wellness visit, subsequent 01/20/2015  . Eczema 08/03/2014  . Sleep apnea 03/01/2014  . Anticoagulation adequate with anticoagulant therapy 03/01/2014  . Pre-syncope 02/26/2014  . Abdominal pain 02/26/2014  . PAD (peripheral artery disease) ([place]) 02/26/2014  . Chronic constipation 03/06/2013  . Easy bruising 03/06/2013  . History of alcohol abuse 04/15/2012  . Atrial flutter with rapid ventricular response ([place]) 03/31/2012  . CAD (coronary atherosclerotic disease) 05/08/2011  . 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 UNS ADVRS EFF UNS RX MEDICINAL&BIOLOGICAL SBSTNC 11/11/2009  . H/O tobacco use, presenting hazards to health 07/29/2009  . COPD GOLD I 07/29/2009  . Hyperlipidemia, mixed 06/20/2007  . Macular degeneration (senile) of retina 06/20/2007  . Essential hypertension 06/20/2007  . GERD 06/16/2007    Past Surgical History:  Procedure Laterality Date  . APPENDECTOMY    . CARDIAC CATHETERIZATION  04/22/2011   moderate left main and RCA stenosis not significant by FFR and IVUS on medical therapy  . CERVICAL DISCECTOMY     C5,6,7 disc fused  with plate and 5 1" screws  . CHOLECYSTECTOMY    . FOOT SURGERY     right calcification removed from top of foot  . knee cartiledge Right    right knee  . MOUTH SURGERY     periodontal surgery, bridges, splint in front,        Home  Medications    Prior to Admission medications   Medication Sig Start Date End Date Taking? Authorizing Provider  brimonidine (ALPHAGAN) 0.2 % ophthalmic solution INT 1 GTT IN OS BID 09/10/17  Yes [provider]  clobetasol (TEMOVATE) 0.05 % external solution APPLY 1 APPLICATION TOPICALLY TWICE DAILY 11/20/16  Yes [name], MD  ELIQUIS 5 MG TABS tablet TAKE 1 TABLET BY MOUTH TWO  TIMES DAILY 06/16/17  Yes Evans Lance, MD  latanoprost (XALATAN) 0.005 % ophthalmic solution Place 1 drop into the left eye at bedtime.     Yes [provider]  metoprolol tartrate (LOPRESSOR) 25 MG tablet Take 0.5 tablets (12.5 mg total) by mouth 2 (two) times daily. 07/19/17 10/17/17 Yes Evans Lance, MD  Multiple Vitamins-Minerals (ICAPS MV) TABS Take 1 tablet by mouth 2 (two) times daily.    Yes [provider]  omeprazole (PRILOSEC OTC) 20 MG tablet Take 20 mg by mouth daily.   Yes [provider]  propafenone (RYTHMOL) 225 MG tablet Take 1 tablet (225 mg total) by mouth 2 (two) times daily. 09/22/17  Yes Evans Lance, MD  azithromycin (ZITHROMAX) 250 MG tablet Take 1 tablet (250 mg total) by mouth daily. Take first 2 tablets together, then 1 every day until finished. 10/11/17   Gareth Morgan, MD  dicyclomine (BENTYL) 20 MG tablet Take 1 tablet (20 mg total) by mouth 2 (two) times daily for 7 days. 10/11/17 10/18/17  Gareth Morgan, MD  ezetimibe (ZETIA) 10 MG tablet TAKE 1 TABLET BY MOUTH  DAILY WITH SUPPER Patient not taking: Reported on 10/11/2017 08/06/17   [name], MD  fluconazole (DIFLUCAN) 100 MG tablet Take 1 tablet (100 mg total) by mouth once a week. Take one by mouth Patient not taking: Reported on 10/11/2017 07/26/17   [name], MD  linaclotide [facility]) 72 MCG capsule Take 1 capsule by mouth daily. 02/02/17   [provider]  magnesium oxide (MAG-OX) 400 (241.3 Mg) MG tablet Take 1 tablet (400 mg total) by mouth daily for 2 days. 10/11/17 10/13/17  Gareth Morgan, MD  ondansetron (ZOFRAN ODT) 4 MG disintegrating tablet Take 1 tablet (4 mg total) by mouth every 8 (eight) hours as needed for nausea or vomiting. 10/11/17   Gareth Morgan, MD  potassium chloride (K-DUR) 10 MEQ tablet Take 2 tablets (20 mEq total) by mouth 2 (two) times daily for 4 days. 10/11/17 10/15/17  Gareth Morgan, MD  Ranibizumab (LUCENTIS IO) Inject into the eye as directed. (Macular Degeneration)    [provider]  tamsulosin (FLOMAX) 0.4 MG CAPS capsule Take 1 capsule (0.4 mg total) by mouth  daily. Patient not taking: Reported on 10/11/2017 07/25/17   Okey Regal, PA-C    Family History Family History  Problem Relation Age of Onset  . Heart disease Mother   . Diabetes Mother   . Cirrhosis Mother   . Emphysema Mother        never smoked but 2nd hand through her spouse  . Hypertension Mother   . Macular degeneration Mother   . Heart disease Father   . Cancer Father        prostate  . Hyperlipidemia Father   . Hypertension Father   . Varicose Veins Father   . Heart attack Father   . Peripheral vascular disease Father   . Heart disease Sister   . Arthritis Sister   . Hyperlipidemia Sister   . Obesity Sister   . Macular degeneration  Sister   . Heart disease Brother        5 stents  . Hyperlipidemia Brother   . Macular degeneration Maternal Grandfather   . Cirrhosis Sister   . Obesity Sister   . Arthritis Sister   . Heart disease Sister   . Obesity Sister   . Liver disease Unknown   . Prostate cancer Unknown   . Coronary artery disease Unknown     Social History Social History   Tobacco Use  . Smoking status: Former Smoker    Packs/day: 0.50    Years: 50.00    Pack years: 25.00    Types: Cigarettes    Last attempt to quit: 05/01/2013    Years since quitting: 4.4  . Smokeless tobacco: Former Systems developer  . Tobacco comment: using e-Cig//ldc  Substance Use Topics  . Alcohol use: No  . Drug use: No     Allergies   Guaifenesin; Polymyxin b-trimethoprim; Codeine; Guaifenesin & derivatives; Statins; Avelox [moxifloxacin]; Mucinex d [pseudoephedrine-guaifenesin er]; Polymyxin b; Pseudoephedrine; Pseudoephedrine-guaifenesin; Rosuvastatin calcium; Simvastatin; Tapentadol; Vioxx [rofecoxib]; and Codeine phosphate   Review of Systems Review of Systems  Constitutional: Positive for fatigue. Negative for fever (subjectively warm per wife).  HENT: Negative for sore throat.   Eyes: Negative for visual disturbance.  Respiratory: Positive for shortness of breath  (reports feels like he is fatigued).   Cardiovascular: Negative for chest pain.  Gastrointestinal: Positive for abdominal pain, diarrhea and nausea. Negative for vomiting.  Genitourinary: Negative for difficulty urinating.  Musculoskeletal: Negative for back pain and neck stiffness.  Skin: Negative for rash.  Neurological: Positive for light-headedness. Negative for syncope and headaches.     Physical Exam Updated Vital Signs BP 117/67   Pulse 62   Temp 97.6 F (36.4 C) (Oral)   Resp 19   Ht 5\' 5"  (1.651 m)   Wt 79.4 kg (175 lb)   SpO2 94%   BMI 29.12 kg/m   Physical Exam  Constitutional: He is oriented to person, place, and time. He appears well-developed and well-nourished. No distress.  HENT:  Head: Normocephalic and atraumatic.  Eyes: Conjunctivae and EOM are normal.  Neck: Normal range of motion.  Cardiovascular: Normal rate, regular rhythm, normal heart sounds and intact distal pulses. Exam reveals no gallop and no friction rub.  No murmur heard. Pulmonary/Chest: Effort normal and breath sounds normal. No respiratory distress. He has no wheezes. He has no rales.  Abdominal: Soft. He exhibits no distension. There is generalized tenderness. There is no guarding.  Genitourinary: Rectal exam shows external hemorrhoid.  Genitourinary Comments: Brown stool on exam   Musculoskeletal: He exhibits no edema.  Neurological: He is alert and oriented to person, place, and time.  Skin: Skin is warm and dry. He is not diaphoretic.  Nursing note and vitals reviewed.    ED Treatments / Results  Labs (all labs ordered are listed, but only abnormal results are displayed) Labs Reviewed  COMPREHENSIVE METABOLIC PANEL - Abnormal; Notable for the following components:      Result Value   Potassium 3.1 (*)    Calcium 8.6 (*)    ALT 14 (*)    Total Bilirubin 1.5 (*)    All other components within normal limits  CBC - Abnormal; Notable for the following components:   RBC 4.16 (*)      HCT 36.9 (*)    All other components within normal limits  MAGNESIUM - Abnormal; Notable for the following components:   Magnesium 0.8 (*)  All other components within normal limits  GASTROINTESTINAL PANEL BY PCR, STOOL (REPLACES STOOL CULTURE)  LIPASE, BLOOD  TYPE AND SCREEN    EKG  EKG Interpretation  Date/Time:  Monday October 11 2017 17:46:17 EST Ventricular Rate:  71 PR Interval:    QRS Duration: 111 QT Interval:  402 QTC Calculation: 428 R Axis:   11 Text Interpretation:  Sinus or ectopic atrial rhythm Artifact limits interpretation Confirmed by Gareth Morgan [phone]) on 10/11/2017 8:22:19 PM       Radiology Ct Angio Abd/pel W And/or Wo Contrast  Result Date: 10/11/2017 CLINICAL DATA:  72 year old with bright red diarrhea and abdominal pain for 2 days. Evaluate for acute mesenteric ischemia. EXAM: CT ANGIOGRAPHY ABDOMEN AND PELVIS WITH CONTRAST AND WITHOUT CONTRAST TECHNIQUE: Multidetector CT imaging of the abdomen and pelvis was performed using the standard protocol during bolus administration of intravenous contrast. Multiplanar reconstructed images and MIPs were obtained and reviewed to evaluate the vascular anatomy. CONTRAST:  136mL ISOVUE-370 IOPAMIDOL (ISOVUE-370) INJECTION 76% COMPARISON:  CTA E 02/26/2014 FINDINGS: VASCULAR Aorta: Diffuse atherosclerotic disease in the abdominal aorta without aneurysm. No significant aortic stenosis. Celiac: Patent without significant stenosis. Mild narrowing at the origin may be related to median arcuate ligament. Prominent left gastric artery. SMA: Mild stenosis at the origin due to mixed plaque. No significant SMA stenosis. Main branch vessels of the SMA are patent. Renals: Single right renal artery is patent without significant stenosis. Total of three left renal arteries. No significant stenosis involving the renal arteries. IMA: Narrowing at the origin of the IMA but the IMA is patent. Main branch vessels are patent. Inflow:  Atherosclerotic disease and mild narrowing at the origin of the common iliac arteries, right side greater the left. No significant stenosis in the external iliac arteries. Flow in the bilateral internal iliac arteries. Proximal Outflow: Proximal femoral arteries are patent. Veins: Portal venous system is patent. IVC and renal veins are patent. Main iliac veins are patent. Review of the MIP images confirms the above findings. NON-VASCULAR Lower chest: Blebs or bulla in the right lower lobe. There may be underlying emphysema. Peripheral reticular densities at both lung bases may represent some scarring. No large effusions. Hepatobiliary: Gallbladder has been removed. Tiny cyst at the liver dome. No suspicious liver lesions. No biliary dilatation. Pancreas: Normal appearance of the pancreas without inflammation or duct dilatation. Spleen: Normal appearance of spleen without enlargement. Adrenals/Urinary Tract: Normal adrenal glands. Normal appearance of both kidneys. 4 mm calcification in the right kidney. Probable cyst in the right kidney mid pole. Normal appearance of the urinary bladder. Stomach/Bowel: There appears to be diffuse wall thickening at the gastric fundus and gastric body. Prominent vascular structures around the proximal stomach. High-density material within the stomach likely represents ingested material. Fluid-filled loops of the small bowel without obstruction. Extensive diverticulosis involving the sigmoid colon without acute inflammatory changes. Fluid within the colon compatible with history of diarrhea. Lymphatic: No significant lymph node enlargement in the abdomen or pelvis. Reproductive: Prostate is unremarkable. Other: No free fluid.  Negative for free air. Musculoskeletal: No acute bone abnormality. IMPRESSION: VASCULAR Diffuse atherosclerotic disease involving the aorta and iliac arteries without aortic aneurysm. Mesenteric arteries are patent with mild narrowing. Findings have not  significantly changed since 2015 and no evidence to suggest mesenteric ischemia. NON-VASCULAR Possible wall thickening involving the stomach. Findings are nonspecific. Nonobstructive right kidney stone. Electronically Signed   By: Markus Daft M.D.   On: 10/11/2017 19:30    Procedures Procedures (including  critical care time)  Medications Ordered in ED Medications  potassium chloride SA (K-DUR,KLOR-CON) CR tablet 40 mEq (40 mEq Oral Given 10/11/17 1833)  ondansetron (ZOFRAN) injection 4 mg (4 mg Intravenous Given 10/11/17 1834)  sodium chloride 0.9 % bolus 1,000 mL (0 mLs Intravenous Stopped 10/11/17 2037)  dicyclomine (BENTYL) injection 20 mg (20 mg Intramuscular Given 10/11/17 1836)  iopamidol (ISOVUE-370) 76 % injection (100 mLs  Contrast Given 10/11/17 1845)  magnesium sulfate IVPB 2 g 50 mL (0 g Intravenous Stopped 10/11/17 2016)     Initial Impression / Assessment and Plan / ED Course  I have reviewed the triage vital signs and the nursing notes.  Pertinent labs & imaging results that were available during my care of the patient were reviewed by me and considered in my medical decision making (see chart for details).     72 year old male with a history of atrial flutter, rheumatic fever on Eliquis, hypertension, hyperlipidemia, coronary artery disease, macular degeneration, appendectomy, cholecystectomy, presents with concern for diarrhea and abdominal pain.  Given history of atrial fibrillation, diffuse pain, ordered CT angios to evaluate for signs of mesenteric ischemia or diverticulitis.  CT angios shows no evidence of acute pathology.  Patient with symptoms for 3 days, with normal hemoglobin, and normal vital signs.  My rectal exam, there is no gross blood, no melena, and no hematochezia, and is consistent with brown stool.  I have low suspicion given description of diarrhea and stool, lab work, vital signs and appearance of stool at this time that patient has a diverticular bleed or other  clinically significant GI bleed.  Recommend continuing PPI given thickening of stomach on CT and following up with Gastroenterology.  Ordered stool studies, however pt without diarrhea in the ED.  Given IV fluids, potassium and magnesium replacement for hypomagnesemia and hypokalemia.    Suspect most likely infectious gastroenteritis as etiology of pain and diarrhea. Given concern for bleeding, diarrhea frequency and age, will cover empirically for bacterial etiologies with azithromycin rx.  Given zofran, bentyl for symptom control and rx for k and short mg rx. Recommend repeat k and mg with PCP and GI follow up.  Patient discharged in stable condition with understanding of reasons to return.   Final Clinical Impressions(s) / ED Diagnoses   Final diagnoses:  Generalized abdominal pain  Gastroenteritis  Hypomagnesemia  Hypokalemia    ED Discharge Orders        Ordered    azithromycin (ZITHROMAX) 250 MG tablet  Daily     10/11/17 2058    ondansetron (ZOFRAN ODT) 4 MG disintegrating tablet  Every 8 hours PRN     10/11/17 2058    potassium chloride (K-DUR) 10 MEQ tablet  2 times daily     10/11/17 2058    magnesium oxide (MAG-OX) 400 (241.3 Mg) MG tablet  Daily     10/11/17 2058    dicyclomine (BENTYL) 20 MG tablet  2 times daily     10/11/17 2058       Gareth Morgan, MD 10/12/17 0148

## 2017-10-11 NOTE — ED Notes (Signed)
Date and time results received: 10/11/17 1851 (use smartphrase ".now" to insert current time)  Test: Mag Critical Value: 0.8  Name of Provider Notified: Schollssmann  Orders Received? Or Actions Taken?: Actions Taken: Notified   Notified ED provider personally.

## 2017-10-11 NOTE — ED Notes (Signed)
PT wife request for Pain med as well as PT to be moved to back before leaving AMA. RN have been made aware

## 2017-10-11 NOTE — ED Triage Notes (Signed)
Mouth swaps given to patient for comfort of dry mouth while he waits for an assigned room.

## 2017-10-11 NOTE — ED Notes (Signed)
Patient transported to CT 

## 2017-10-11 NOTE — ED Triage Notes (Signed)
Patient c/o bright red diarrhea, abdominal pain x 2 days.

## 2017-10-15 ENCOUNTER — Telehealth: Payer: Self-pay

## 2017-10-15 NOTE — Telephone Encounter (Addendum)
    [facility] Pre-operative Risk Assessment    Request for surgical clearance:  1. What type of surgery is being performed? Colonoscopy and EOD  2. When is this surgery scheduled? 10/28/2017      3. Are there any medications that need to be held prior to surgery and how long? Eliquis   4. Practice name and name of physician performing surgery? [facility] Dr Earlean Shawl  5. What is your office phone and fax number?  Fax: [phone] phone: [phone]  6. Anesthesia type (None, local, MAC, general) ? none   Ulice Brilliant T 10/15/2017, 2:39 PM  _________________________________________________________________   (provider comments below)

## 2017-10-17 ENCOUNTER — Other Ambulatory Visit: Payer: Self-pay | Admitting: Internal Medicine

## 2017-10-19 NOTE — Telephone Encounter (Signed)
   Primary Cardiologist: No primary care provider on file.  Chart reviewed as part of pre-operative protocol coverage. Given past medical history and time since last visit, based on ACC/AHA guidelines, Ryan [name] would be at acceptable risk for the planned procedure without further cardiovascular testing.   He will need to hold Eliquis 8 hours to 72 hours prior to procedure if polypectomy and possible biopsies are anticipated.  I will route this recommendation to the requesting party via Epic fax function and remove from pre-op pool.  Please call with questions.  Jory Sims DNP 10/19/2017, 5:04 PM

## 2017-10-20 NOTE — Telephone Encounter (Signed)
Faxed via EPIC to requesting provider

## 2017-10-27 DIAGNOSIS — Z7901 Long term (current) use of anticoagulants: Secondary | ICD-10-CM | POA: Diagnosis not present

## 2017-10-28 DIAGNOSIS — K5730 Diverticulosis of large intestine without perforation or abscess without bleeding: Secondary | ICD-10-CM | POA: Diagnosis not present

## 2017-10-28 DIAGNOSIS — R109 Unspecified abdominal pain: Secondary | ICD-10-CM | POA: Diagnosis not present

## 2017-10-28 DIAGNOSIS — R195 Other fecal abnormalities: Secondary | ICD-10-CM | POA: Diagnosis not present

## 2017-10-28 DIAGNOSIS — K921 Melena: Secondary | ICD-10-CM | POA: Diagnosis not present

## 2017-10-28 DIAGNOSIS — R1013 Epigastric pain: Secondary | ICD-10-CM | POA: Diagnosis not present

## 2017-10-28 DIAGNOSIS — R1032 Left lower quadrant pain: Secondary | ICD-10-CM | POA: Diagnosis not present

## 2017-10-28 DIAGNOSIS — Z1211 Encounter for screening for malignant neoplasm of colon: Secondary | ICD-10-CM | POA: Diagnosis not present

## 2017-10-28 DIAGNOSIS — K219 Gastro-esophageal reflux disease without esophagitis: Secondary | ICD-10-CM | POA: Diagnosis not present

## 2017-10-28 DIAGNOSIS — R197 Diarrhea, unspecified: Secondary | ICD-10-CM | POA: Diagnosis not present

## 2017-10-28 DIAGNOSIS — K648 Other hemorrhoids: Secondary | ICD-10-CM | POA: Diagnosis not present

## 2017-10-28 DIAGNOSIS — K635 Polyp of colon: Secondary | ICD-10-CM | POA: Diagnosis not present

## 2017-10-28 LAB — HM COLONOSCOPY

## 2017-10-29 DIAGNOSIS — D123 Benign neoplasm of transverse colon: Secondary | ICD-10-CM | POA: Diagnosis not present

## 2017-10-29 DIAGNOSIS — D125 Benign neoplasm of sigmoid colon: Secondary | ICD-10-CM | POA: Diagnosis not present

## 2017-11-15 DIAGNOSIS — K648 Other hemorrhoids: Secondary | ICD-10-CM | POA: Diagnosis not present

## 2017-11-15 DIAGNOSIS — K5790 Diverticulosis of intestine, part unspecified, without perforation or abscess without bleeding: Secondary | ICD-10-CM | POA: Diagnosis not present

## 2017-11-15 DIAGNOSIS — D126 Benign neoplasm of colon, unspecified: Secondary | ICD-10-CM | POA: Diagnosis not present

## 2017-11-25 DIAGNOSIS — H35363 Drusen (degenerative) of macula, bilateral: Secondary | ICD-10-CM | POA: Diagnosis not present

## 2017-11-25 DIAGNOSIS — H353221 Exudative age-related macular degeneration, left eye, with active choroidal neovascularization: Secondary | ICD-10-CM | POA: Diagnosis not present

## 2017-11-25 DIAGNOSIS — H25813 Combined forms of age-related cataract, bilateral: Secondary | ICD-10-CM | POA: Diagnosis not present

## 2017-11-25 DIAGNOSIS — H401124 Primary open-angle glaucoma, left eye, indeterminate stage: Secondary | ICD-10-CM | POA: Diagnosis not present

## 2017-11-25 DIAGNOSIS — H353110 Nonexudative age-related macular degeneration, right eye, stage unspecified: Secondary | ICD-10-CM | POA: Diagnosis not present

## 2017-11-30 DIAGNOSIS — M5416 Radiculopathy, lumbar region: Secondary | ICD-10-CM | POA: Diagnosis not present

## 2017-11-30 DIAGNOSIS — R030 Elevated blood-pressure reading, without diagnosis of hypertension: Secondary | ICD-10-CM | POA: Diagnosis not present

## 2017-11-30 DIAGNOSIS — M4726 Other spondylosis with radiculopathy, lumbar region: Secondary | ICD-10-CM | POA: Diagnosis not present

## 2017-11-30 DIAGNOSIS — Z6830 Body mass index (BMI) 30.0-30.9, adult: Secondary | ICD-10-CM | POA: Diagnosis not present

## 2017-11-30 DIAGNOSIS — M5136 Other intervertebral disc degeneration, lumbar region: Secondary | ICD-10-CM | POA: Diagnosis not present

## 2017-11-30 DIAGNOSIS — M48062 Spinal stenosis, lumbar region with neurogenic claudication: Secondary | ICD-10-CM | POA: Diagnosis not present

## 2017-12-02 DIAGNOSIS — M545 Low back pain: Secondary | ICD-10-CM | POA: Diagnosis not present

## 2017-12-02 DIAGNOSIS — M5416 Radiculopathy, lumbar region: Secondary | ICD-10-CM | POA: Diagnosis not present

## 2017-12-03 HISTORY — PX: COLONOSCOPY: SHX174

## 2017-12-13 DIAGNOSIS — M5136 Other intervertebral disc degeneration, lumbar region: Secondary | ICD-10-CM | POA: Diagnosis not present

## 2017-12-13 DIAGNOSIS — M48062 Spinal stenosis, lumbar region with neurogenic claudication: Secondary | ICD-10-CM | POA: Diagnosis not present

## 2017-12-13 DIAGNOSIS — M5416 Radiculopathy, lumbar region: Secondary | ICD-10-CM | POA: Diagnosis not present

## 2017-12-13 DIAGNOSIS — M4726 Other spondylosis with radiculopathy, lumbar region: Secondary | ICD-10-CM | POA: Diagnosis not present

## 2017-12-13 DIAGNOSIS — Z6831 Body mass index (BMI) 31.0-31.9, adult: Secondary | ICD-10-CM | POA: Diagnosis not present

## 2017-12-23 DIAGNOSIS — M48061 Spinal stenosis, lumbar region without neurogenic claudication: Secondary | ICD-10-CM | POA: Diagnosis not present

## 2017-12-23 DIAGNOSIS — M5136 Other intervertebral disc degeneration, lumbar region: Secondary | ICD-10-CM | POA: Diagnosis not present

## 2017-12-23 DIAGNOSIS — M4726 Other spondylosis with radiculopathy, lumbar region: Secondary | ICD-10-CM | POA: Diagnosis not present

## 2017-12-27 ENCOUNTER — Ambulatory Visit: Payer: Medicare Other | Admitting: Family Medicine

## 2017-12-27 ENCOUNTER — Encounter: Payer: Self-pay | Admitting: Family Medicine

## 2017-12-27 ENCOUNTER — Ambulatory Visit (INDEPENDENT_AMBULATORY_CARE_PROVIDER_SITE_OTHER): Payer: Medicare Other | Admitting: Family Medicine

## 2017-12-27 VITALS — BP 138/86 | HR 76 | Temp 98.0°F | Resp 18 | Wt 176.6 lb

## 2017-12-27 DIAGNOSIS — I10 Essential (primary) hypertension: Secondary | ICD-10-CM | POA: Diagnosis not present

## 2017-12-27 DIAGNOSIS — M810 Age-related osteoporosis without current pathological fracture: Secondary | ICD-10-CM | POA: Diagnosis not present

## 2017-12-27 DIAGNOSIS — E782 Mixed hyperlipidemia: Secondary | ICD-10-CM | POA: Diagnosis not present

## 2017-12-27 DIAGNOSIS — R7989 Other specified abnormal findings of blood chemistry: Secondary | ICD-10-CM

## 2017-12-27 DIAGNOSIS — I2510 Atherosclerotic heart disease of native coronary artery without angina pectoris: Secondary | ICD-10-CM | POA: Diagnosis not present

## 2017-12-27 DIAGNOSIS — Z0000 Encounter for general adult medical examination without abnormal findings: Secondary | ICD-10-CM

## 2017-12-27 DIAGNOSIS — R739 Hyperglycemia, unspecified: Secondary | ICD-10-CM | POA: Diagnosis not present

## 2017-12-27 DIAGNOSIS — N200 Calculus of kidney: Secondary | ICD-10-CM

## 2017-12-27 DIAGNOSIS — E559 Vitamin D deficiency, unspecified: Secondary | ICD-10-CM | POA: Diagnosis not present

## 2017-12-27 DIAGNOSIS — Z8679 Personal history of other diseases of the circulatory system: Secondary | ICD-10-CM | POA: Diagnosis not present

## 2017-12-27 LAB — URINALYSIS
Bilirubin Urine: NEGATIVE
Hgb urine dipstick: NEGATIVE
Ketones, ur: NEGATIVE
Leukocytes, UA: NEGATIVE
Nitrite: NEGATIVE
Specific Gravity, Urine: 1.02 (ref 1.000–1.030)
Total Protein, Urine: NEGATIVE
Urine Glucose: NEGATIVE
Urobilinogen, UA: 0.2 (ref 0.0–1.0)
pH: 6 (ref 5.0–8.0)

## 2017-12-27 LAB — TSH: TSH: 1.89 u[IU]/mL (ref 0.35–4.50)

## 2017-12-27 LAB — CBC WITH DIFFERENTIAL/PLATELET
Basophils Absolute: 0 10*3/uL (ref 0.0–0.1)
Basophils Relative: 0.1 % (ref 0.0–3.0)
Eosinophils Absolute: 0 10*3/uL (ref 0.0–0.7)
Eosinophils Relative: 0 % (ref 0.0–5.0)
HCT: 42.6 % (ref 39.0–52.0)
Hemoglobin: 14.4 g/dL (ref 13.0–17.0)
Lymphocytes Relative: 13.2 % (ref 12.0–46.0)
Lymphs Abs: 1.8 10*3/uL (ref 0.7–4.0)
MCHC: 33.7 g/dL (ref 30.0–36.0)
MCV: 91.2 fl (ref 78.0–100.0)
Monocytes Absolute: 1 10*3/uL (ref 0.1–1.0)
Monocytes Relative: 7 % (ref 3.0–12.0)
Neutro Abs: 11 10*3/uL — ABNORMAL HIGH (ref 1.4–7.7)
Neutrophils Relative %: 79.7 % — ABNORMAL HIGH (ref 43.0–77.0)
Platelets: 234 10*3/uL (ref 150.0–400.0)
RBC: 4.67 Mil/uL (ref 4.22–5.81)
RDW: 13.5 % (ref 11.5–15.5)
WBC: 13.8 10*3/uL — ABNORMAL HIGH (ref 4.0–10.5)

## 2017-12-27 LAB — LIPID PANEL
Cholesterol: 232 mg/dL — ABNORMAL HIGH (ref 0–200)
HDL: 47.1 mg/dL (ref >39.00)
LDL Cholesterol: 169 mg/dL — ABNORMAL HIGH (ref 0–99)
NonHDL: 185.18
Total CHOL/HDL Ratio: 5
Triglycerides: 83 mg/dL (ref 0.0–149.0)
VLDL: 16.6 mg/dL (ref 0.0–40.0)

## 2017-12-27 LAB — COMPREHENSIVE METABOLIC PANEL
ALT: 17 U/L (ref 0–53)
AST: 18 U/L (ref 0–37)
Albumin: 4 g/dL (ref 3.5–5.2)
Alkaline Phosphatase: 50 U/L (ref 39–117)
BUN: 24 mg/dL — ABNORMAL HIGH (ref 6–23)
CO2: 27 mEq/L (ref 19–32)
Calcium: 9.6 mg/dL (ref 8.4–10.5)
Chloride: 102 mEq/L (ref 96–112)
Creatinine, Ser: 1.01 mg/dL (ref 0.40–1.50)
GFR: 77.41 mL/min (ref >60.00)
Glucose, Bld: 104 mg/dL — ABNORMAL HIGH (ref 70–99)
Potassium: 4.7 mEq/L (ref 3.5–5.1)
Sodium: 136 mEq/L (ref 135–145)
Total Bilirubin: 0.6 mg/dL (ref 0.2–1.2)
Total Protein: 7.4 g/dL (ref 6.0–8.3)

## 2017-12-27 LAB — HEMOGLOBIN A1C: Hgb A1c MFr Bld: 5.4 % (ref 4.6–6.5)

## 2017-12-27 LAB — VITAMIN D 25 HYDROXY (VIT D DEFICIENCY, FRACTURES): VITD: 44.94 ng/mL (ref 30.00–100.00)

## 2017-12-27 NOTE — Assessment & Plan Note (Signed)
Check level today 

## 2017-12-27 NOTE — Assessment & Plan Note (Signed)
Check UA

## 2017-12-27 NOTE — Patient Instructions (Signed)
Cold Brew Coffee has less acid   Potassium Content of Foods Potassium is a mineral found in many foods and drinks. It helps keep fluids and minerals balanced in your body and affects how steadily your heart beats. Potassium also helps control your blood pressure and keep your muscles and nervous system healthy. Certain health conditions and medicines may change the balance of potassium in your body. When this happens, you can help balance your level of potassium through the foods that you do or do not eat. Your health care provider or dietitian may recommend an amount of potassium that you should have each day. The following lists of foods provide the amount of potassium (in parentheses) per serving in each item. High in potassium The following foods and beverages have 200 mg or more of potassium per serving:  Apricots, 2 raw or 5 dry (200 mg).  Artichoke, 1 medium (345 mg).  Avocado, raw,  each (245 mg).  Banana, 1 medium (425 mg).  Beans, lima, or baked beans, canned,  cup (280 mg).  Beans, white, canned,  cup (595 mg).  Beef roast, 3 oz (320 mg).  Beef, ground, 3 oz (270 mg).  Beets, raw or cooked,  cup (260 mg).  Bran muffin, 2 oz (300 mg).  Broccoli,  cup (230 mg).  Brussels sprouts,  cup (250 mg).  Cantaloupe,  cup (215 mg).  Cereal, 100% bran,  cup (200-400 mg).  Cheeseburger, single, fast food, 1 each (225-400 mg).  Chicken, 3 oz (220 mg).  Clams, canned, 3 oz (535 mg).  Crab, 3 oz (225 mg).  Dates, 5 each (270 mg).  Dried beans and peas,  cup (300-475 mg).  Figs, dried, 2 each (260 mg).  Fish: halibut, tuna, cod, snapper, 3 oz (480 mg).  Fish: salmon, haddock, swordfish, perch, 3 oz (300 mg).  Fish, tuna, canned 3 oz (200 mg).  [place] fries, fast food, 3 oz (470 mg).  Granola with fruit and nuts,  cup (200 mg).  Grapefruit juice,  cup (200 mg).  Greens, beet,  cup (655 mg).  Honeydew melon,  cup (200 mg).  Kale, raw, 1 cup (300  mg).  Kiwi, 1 medium (240 mg).  Kohlrabi, rutabaga, parsnips,  cup (280 mg).  Lentils,  cup (365 mg).  Mango, 1 each (325 mg).  Milk, chocolate, 1 cup (420 mg).  Milk: nonfat, low-fat, whole, buttermilk, 1 cup (350-380 mg).  Molasses, 1 Tbsp (295 mg).  Mushrooms,  cup (280) mg.  Nectarine, 1 each (275 mg).  Nuts: almonds, peanuts, hazelnuts, [place], cashew, mixed, 1 oz (200 mg).  Nuts, pistachios, 1 oz (295 mg).  Orange, 1 each (240 mg).  Orange juice,  cup (235 mg).  Papaya, medium,  fruit (390 mg).  Peanut butter, chunky, 2 Tbsp (240 mg).  Peanut butter, smooth, 2 Tbsp (210 mg).  Pear, 1 medium (200 mg).  Pomegranate, 1 whole (400 mg).  Pomegranate juice,  cup (215 mg).  Pork, 3 oz (350 mg).  Potato chips, salted, 1 oz (465 mg).  Potato, baked with skin, 1 medium (925 mg).  Potatoes, boiled,  cup (255 mg).  Potatoes, mashed,  cup (330 mg).  Prune juice,  cup (370 mg).  Prunes, 5 each (305 mg).  Pudding, chocolate,  cup (230 mg).  Pumpkin, canned,  cup (250 mg).  Raisins, seedless,  cup (270 mg).  Seeds, sunflower or pumpkin, 1 oz (240 mg).  Soy milk, 1 cup (300 mg).  Spinach,  cup (420 mg).  Spinach, canned,  cup (370 mg).  Sweet potato, baked with skin, 1 medium (450 mg).  Swiss chard,  cup (480 mg).  Tomato or vegetable juice,  cup (275 mg).  Tomato sauce or puree,  cup (400-550 mg).  Tomato, raw, 1 medium (290 mg).  Tomatoes, canned,  cup (200-300 mg).  [place], 3 oz (250 mg).  Wheat germ, 1 oz (250 mg).  Winter squash,  cup (250 mg).  Yogurt, plain or fruited, 6 oz (260-435 mg).  Zucchini,  cup (220 mg).  Moderate in potassium The following foods and beverages have 50-200 mg of potassium per serving:  Apple, 1 each (150 mg).  Apple juice,  cup (150 mg).  Applesauce,  cup (90 mg).  Apricot nectar,  cup (140 mg).  Asparagus, small spears,  cup or 6 spears (155 mg).  Bagel, cinnamon  raisin, 1 each (130 mg).  Bagel, egg or plain, 4 in., 1 each (70 mg).  Beans, green,  cup (90 mg).  Beans, yellow,  cup (190 mg).  Beer, regular, 12 oz (100 mg).  Beets, canned,  cup (125 mg).  Blackberries,  cup (115 mg).  Blueberries,  cup (60 mg).  Bread, whole wheat, 1 slice (70 mg).  Broccoli, raw,  cup (145 mg).  Cabbage,  cup (150 mg).  Carrots, cooked or raw,  cup (180 mg).  Cauliflower, raw,  cup (150 mg).  Celery, raw,  cup (155 mg).  Cereal, bran flakes, cup (120-150 mg).  Cheese, cottage,  cup (110 mg).  Cherries, 10 each (150 mg).  Chocolate, 1 oz bar (165 mg).  Coffee, brewed 6 oz (90 mg).  Corn,  cup or 1 ear (195 mg).  Cucumbers,  cup (80 mg).  Egg, large, 1 each (60 mg).  Eggplant,  cup (60 mg).  Endive, raw, cup (80 mg).  English muffin, 1 each (65 mg).  Fish, orange roughy, 3 oz (150 mg).  Frankfurter, beef or pork, 1 each (75 mg).  Fruit cocktail,  cup (115 mg).  Grape juice,  cup (170 mg).  Grapefruit,  fruit (175 mg).  Grapes,  cup (155 mg).  Greens: kale, turnip, collard,  cup (110-150 mg).  Ice cream or frozen yogurt, chocolate,  cup (175 mg).  Ice cream or frozen yogurt, vanilla,  cup (120-150 mg).  Lemons, limes, 1 each (80 mg).  Lettuce, all types, 1 cup (100 mg).  Mixed vegetables,  cup (150 mg).  Mushrooms, raw,  cup (110 mg).  Nuts: walnuts, pecans, or macadamia, 1 oz (125 mg).  Oatmeal,  cup (80 mg).  Okra,  cup (110 mg).  Onions, raw,  cup (120 mg).  Peach, 1 each (185 mg).  Peaches, canned,  cup (120 mg).  Pears, canned,  cup (120 mg).  Peas, green, frozen,  cup (90 mg).  Peppers, green,  cup (130 mg).  Peppers, red,  cup (160 mg).  Pineapple juice,  cup (165 mg).  Pineapple, fresh or canned,  cup (100 mg).  Plums, 1 each (105 mg).  Pudding, vanilla,  cup (150 mg).  Raspberries,  cup (90 mg).  Rhubarb,  cup (115 mg).  Rice, wild,  cup (80  mg).  Shrimp, 3 oz (155 mg).  Spinach, raw, 1 cup (170 mg).  Strawberries,  cup (125 mg).  Summer squash  cup (175-200 mg).  Swiss chard, raw, 1 cup (135 mg).  Tangerines, 1 each (140 mg).  Tea, brewed, 6 oz (65 mg).  Turnips,  cup (140 mg).  Watermelon,  cup (85 mg).  Wine, red, table, 5 oz (180 mg).  Wine, white, table, 5 oz (100 mg).  Low in potassium The following foods and beverages have less than 50 mg of potassium per serving.  Bread, white, 1 slice (30 mg).  Carbonated beverages, 12 oz (less than 5 mg).  Cheese, 1 oz (20-30 mg).  Cranberries,  cup (45 mg).  Cranberry juice cocktail,  cup (20 mg).  Fats and oils, 1 Tbsp (less than 5 mg).  Hummus, 1 Tbsp (32 mg).  Nectar: papaya, mango, or pear,  cup (35 mg).  Rice, white or brown,  cup (50 mg).  Spaghetti or macaroni,  cup cooked (30 mg).  Tortilla, flour or corn, 1 each (50 mg).  Waffle, 4 in., 1 each (50 mg).  Water chestnuts,  cup (40 mg).  This information is not intended to replace advice given to you by your health care provider. Make sure you discuss any questions you have with your health care provider. Document Released: 05/05/2005 Document Revised: 02/27/2016 Document Reviewed: 08/18/2013 Elsevier Interactive Patient Education  2018 [place] 65 Years and Older, Male Preventive care refers to lifestyle choices and visits with your health care provider that can promote health and wellness. What does preventive care include?  A yearly physical exam. This is also called an annual well check.  Dental exams once or twice a year.  Routine eye exams. Ask your health care provider how often you should have your eyes checked.  Personal lifestyle choices, including: ? Daily care of your teeth and gums. ? Regular physical activity. ? Eating a healthy diet. ? Avoiding tobacco and drug use. ? Limiting alcohol use. ? Practicing safe sex. ? Taking low doses of  aspirin every day. ? Taking vitamin and mineral supplements as recommended by your health care provider. What happens during an annual well check? The services and screenings done by your health care provider during your annual well check will depend on your age, overall health, lifestyle risk factors, and family history of disease. Counseling Your health care provider may ask you questions about your:  Alcohol use.  Tobacco use.  Drug use.  Emotional well-being.  Home and relationship well-being.  Sexual activity.  Eating habits.  History of falls.  Memory and ability to understand (cognition).  Work and work Statistician.  Screening You may have the following tests or measurements:  Height, weight, and BMI.  Blood pressure.  Lipid and cholesterol levels. These may be checked every 5 years, or more frequently if you are over 11 years old.  Skin check.  Lung cancer screening. You may have this screening every year starting at age 66 if you have a 30-pack-year history of smoking and currently smoke or have quit within the past 15 years.  Fecal occult blood test (FOBT) of the stool. You may have this test every year starting at age 14.  Flexible sigmoidoscopy or colonoscopy. You may have a sigmoidoscopy every 5 years or a colonoscopy every 10 years starting at age 64.  Prostate cancer screening. Recommendations will vary depending on your family history and other risks.  Hepatitis C blood test.  Hepatitis B blood test.  Sexually transmitted disease (STD) testing.  Diabetes screening. This is done by checking your blood sugar (glucose) after you have not eaten for a while (fasting). You may have this done every 1-3 years.  Abdominal aortic aneurysm (AAA) screening. You may need this if you are a current or former  smoker.  Osteoporosis. You may be screened starting at age 82 if you are at high risk.  Talk with your health care provider about your test results,  treatment options, and if necessary, the need for more tests. Vaccines Your health care provider may recommend certain vaccines, such as:  Influenza vaccine. This is recommended every year.  Tetanus, diphtheria, and acellular pertussis (Tdap, Td) vaccine. You may need a Td booster every 10 years.  Varicella vaccine. You may need this if you have not been vaccinated.  Zoster vaccine. You may need this after age 71.  Measles, mumps, and rubella (MMR) vaccine. You may need at least one dose of MMR if you were born in 1957 or later. You may also need a second dose.  Pneumococcal 13-valent conjugate (PCV13) vaccine. One dose is recommended after age 25.  Pneumococcal polysaccharide (PPSV23) vaccine. One dose is recommended after age 30.  Meningococcal vaccine. You may need this if you have certain conditions.  Hepatitis A vaccine. You may need this if you have certain conditions or if you travel or work in places where you may be exposed to hepatitis A.  Hepatitis B vaccine. You may need this if you have certain conditions or if you travel or work in places where you may be exposed to hepatitis B.  Haemophilus influenzae type b (Hib) vaccine. You may need this if you have certain risk factors.  Talk to your health care provider about which screenings and vaccines you need and how often you need them. This information is not intended to replace advice given to you by your health care provider. Make sure you discuss any questions you have with your health care provider. Document Released: 10/18/2015 Document Revised: 06/10/2016 Document Reviewed: 07/23/2015 Elsevier Interactive Patient Education  [employer].

## 2017-12-27 NOTE — Assessment & Plan Note (Signed)
Well controlled, no changes to meds. Encouraged heart healthy diet such as the DASH diet and exercise as tolerated.  °

## 2017-12-27 NOTE — Assessment & Plan Note (Signed)
Encouraged to get adequate exercise, calcium and vitamin d intake 

## 2017-12-27 NOTE — Progress Notes (Signed)
Subjective:  I acted as a Education administrator for Dr. Charlett Blake. [place], [place]  Patient ID: Ryan [name], male    DOB: July 09, 1946, 72 y.o.   MRN: [id number]  No chief complaint on file.   HPI  Patient is in today for an annual exam and follow up on chronic medical concerns, he feels well today but has had 3 ER trips in the past few months for abdominal pain and was ultimately found to have obstructing colon polyps that were removed by his gastroenterologist, Dr Earlean Shawl, he has felt better since that time. He denies any change in bowel habits, abdominal pain, bleeding or anorexia. He denies any recent febrile illness. Denies CP/palp/SOB/HA/congestion/fevers or GU c/o. Taking meds as prescribed  Patient Care Team: [name], MD as PCP - General (Family Medicine) Jovita Gamma, MD as Consulting Physician (Neurosurgery) Evans Lance, MD as Consulting Physician (Cardiology) Gerarda Fraction, MD as Referring Physician (Ophthalmology) Richmond Campbell, MD as Consulting Physician (Gastroenterology)   Past Medical History:  Diagnosis Date  . Anemia   . Atrial flutter ([place]) 03/31/2012   converted spontaneously to sinus  . CAD (coronary artery disease)    reportedly moderate CAD; managed medically  . COPD (chronic obstructive pulmonary disease) ([place])   . Eczema 08/03/2014  . GERD (gastroesophageal reflux disease)   . H/O: rheumatic fever   . Heart murmur   . Heart murmur   . High risk medication use    on amiodarone since 03/31/2012  . History of colonoscopy   . Hyperglycemia 06/21/2017  . Hyperlipidemia   . Hypertension   . Kidney stone 06/21/2017  . Kidney stones   . Low vitamin D level 06/21/2017  . Macular degeneration   . Osteoporosis 05/31/2016    Past Surgical History:  Procedure Laterality Date  . APPENDECTOMY    . CARDIAC CATHETERIZATION  04/22/2011   moderate left main and RCA stenosis not significant by FFR and IVUS on medical therapy  . CERVICAL DISCECTOMY     C5,6,7 disc  fused  with plate and 5 1" screws  . CHOLECYSTECTOMY    . FOOT SURGERY     right calcification removed from top of foot  . knee cartiledge Right    right knee  . MOUTH SURGERY     periodontal surgery, bridges, splint in front,     Family History  Problem Relation Age of Onset  . Heart disease Mother   . Diabetes Mother   . Cirrhosis Mother   . Emphysema Mother        never smoked but 2nd hand through her spouse  . Hypertension Mother   . Macular degeneration Mother   . Heart disease Father   . Cancer Father        prostate  . Hyperlipidemia Father   . Hypertension Father   . Varicose Veins Father   . Heart attack Father   . Peripheral vascular disease Father   . Heart disease Sister   . Arthritis Sister   . Hyperlipidemia Sister   . Obesity Sister   . Macular degeneration Sister   . Heart disease Brother        5 stents  . Hyperlipidemia Brother   . Macular degeneration Maternal Grandfather   . Cirrhosis Sister   . Obesity Sister   . Arthritis Sister   . Heart disease Sister   . Obesity Sister   . Liver disease Unknown   . Prostate cancer Unknown   . Coronary artery  disease Unknown     Social History   Socioeconomic History  . Marital status: Married    Spouse name: Not on file  . Number of children: 0  . Years of education: Not on file  . Highest education level: Not on file  Occupational History  . Occupation: retired    Fish farm manager: RETIRED  Social Needs  . Financial resource strain: Not on file  . Food insecurity:    Worry: Not on file    Inability: Not on file  . Transportation needs:    Medical: Not on file    Non-medical: Not on file  Tobacco Use  . Smoking status: Former Smoker    Packs/day: 0.50    Years: 50.00    Pack years: 25.00    Types: Cigarettes    Last attempt to quit: 05/01/2013    Years since quitting: 4.6  . Smokeless tobacco: Former Systems developer  . Tobacco comment: using e[employer]ldc  Substance and Sexual Activity  . Alcohol use: No    . Drug use: No  . Sexual activity: Yes    Comment: lives with wife, no dietary restrictions  Lifestyle  . Physical activity:    Days per week: Not on file    Minutes per session: Not on file  . Stress: Not on file  Relationships  . Social connections:    Talks on phone: Not on file    Gets together: Not on file    Attends religious service: Not on file    Active member of club or organization: Not on file    Attends meetings of clubs or organizations: Not on file    Relationship status: Not on file  . Intimate partner violence:    Fear of current or ex partner: Not on file    Emotionally abused: Not on file    Physically abused: Not on file    Forced sexual activity: Not on file  Other Topics Concern  . Not on file  Social History Narrative  . Not on file    Outpatient Medications Prior to Visit  Medication Sig Dispense Refill  . brimonidine (ALPHAGAN) 0.2 % ophthalmic solution INT 1 GTT IN OS BID  7  . clobetasol (TEMOVATE) 0.05 % external solution APPLY 1 APPLICATION TOPICALLY TWICE DAILY 100 mL 3  . ELIQUIS 5 MG TABS tablet TAKE 1 TABLET BY MOUTH TWO  TIMES DAILY 180 tablet 3  . latanoprost (XALATAN) 0.005 % ophthalmic solution Place 1 drop into the left eye at bedtime.     . metoprolol tartrate (LOPRESSOR) 25 MG tablet TAKE ONE-HALF TABLET BY  MOUTH TWO TIMES DAILY 90 tablet 2  . Multiple Vitamins-Minerals (ICAPS MV) TABS Take 1 tablet by mouth 2 (two) times daily.     Marland Kitchen omeprazole (PRILOSEC OTC) 20 MG tablet Take 20 mg by mouth daily.    . propafenone (RYTHMOL) 225 MG tablet Take 1 tablet (225 mg total) by mouth 2 (two) times daily. 180 tablet 3  . Ranibizumab (LUCENTIS IO) Inject into the eye as directed. (Macular Degeneration)    . tamsulosin (FLOMAX) 0.4 MG CAPS capsule Take 1 capsule (0.4 mg total) by mouth daily. 30 capsule 0  . azithromycin (ZITHROMAX) 250 MG tablet Take 1 tablet (250 mg total) by mouth daily. Take first 2 tablets together, then 1 every day until  finished. 6 tablet 0  . dicyclomine (BENTYL) 20 MG tablet Take 1 tablet (20 mg total) by mouth 2 (two) times daily for 7 days. [place]  tablet 0  . ezetimibe (ZETIA) 10 MG tablet TAKE 1 TABLET BY MOUTH  DAILY WITH SUPPER (Patient not taking: Reported on 10/11/2017) 90 tablet 1  . fluconazole (DIFLUCAN) 100 MG tablet Take 1 tablet (100 mg total) by mouth once a week. Take one by mouth (Patient not taking: Reported on 10/11/2017) 4 tablet 5  . linaclotide (LINZESS) 72 MCG capsule Take 1 capsule by mouth daily.    . ondansetron (ZOFRAN ODT) 4 MG disintegrating tablet Take 1 tablet (4 mg total) by mouth every 8 (eight) hours as needed for nausea or vomiting. 20 tablet 0  . potassium chloride (K-DUR) 10 MEQ tablet Take 2 tablets (20 mEq total) by mouth 2 (two) times daily for 4 days. 16 tablet 0   No facility-administered medications prior to visit.     Allergies  Allergen Reactions  . Guaifenesin Diarrhea and Anaphylaxis  . Polymyxin B-Trimethoprim Swelling    Eye drops made eyes swell  . Codeine Rash, Itching and Hives  . Guaifenesin & Derivatives     Other reaction(s): Hallucinations  . Statins Other (See Comments)    Muscle aches, Simvastatin,Atorvstatin,   . Avelox [Moxifloxacin] Other (See Comments)    Stomach cramps  . Mucinex D [Pseudoephedrine-Guaifenesin Er] Nausea And Vomiting    Stomach cramps  . Polymyxin B Other (See Comments)    Swelling Eye swelling   . Pseudoephedrine Hives    Stomach cramps  . Pseudoephedrine-Guaifenesin Nausea And Vomiting    Stomach cramps  . Rosuvastatin Calcium Other (See Comments)    Muscle aches  . Simvastatin Other (See Comments)    Muscle aches and cramps  . Tapentadol Other (See Comments)    unknown  . Vioxx [Rofecoxib] Other (See Comments)    Stomach cramping   . Codeine Phosphate Itching and Rash    ROS     Objective:    Physical Exam  BP 138/86   Pulse 76   Temp 98 F (36.7 C) (Oral)   Resp 18   Wt 176 lb 9.6 oz (80.1 kg)    SpO2 98%   BMI 29.39 kg/m  Wt Readings from Last 3 Encounters:  12/27/17 176 lb 9.6 oz (80.1 kg)  10/11/17 175 lb (79.4 kg)  09/07/17 179 lb (81.2 kg)   BP Readings from Last 3 Encounters:  01/01/18 138/86  10/11/17 117/67  09/07/17 (!) 146/78     Immunization History  Administered Date(s) Administered  . Influenza Split 07/06/2011, 06/22/2013  . Influenza Whole 10/05/2000, 07/18/2009, 06/20/2010, 07/05/2012  . Influenza, High Dose Seasonal PF 06/23/2016, 06/21/2017  . Influenza,inj,Quad PF,6+ Mos 06/25/2014  . Pneumococcal Conjugate-13 12/01/2013  . Pneumococcal Polysaccharide-23 07/06/2007, 06/23/2016  . Td 10/05/2002  . Tdap 05/06/2012  . Zoster 03/27/2009    Health Maintenance  Topic Date Due  . Samul Dada  05/06/2022  . COLONOSCOPY  05/03/2024  . INFLUENZA VACCINE  Completed  . Hepatitis C Screening  Completed  . PNA vac Low Risk Adult  Completed    Lab Results  Component Value Date   WBC 13.8 (H) 12/27/2017   HGB 14.4 12/27/2017   HCT 42.6 12/27/2017   PLT 234.0 12/27/2017   GLUCOSE 104 (H) 12/27/2017   CHOL 232 (H) 12/27/2017   TRIG 83.0 12/27/2017   HDL 47.10 12/27/2017   LDLDIRECT 134.2 12/01/2013   LDLCALC 169 (H) 12/27/2017   ALT 17 12/27/2017   AST 18 12/27/2017   NA 136 12/27/2017   K 4.7 12/27/2017   CL 102 12/27/2017  CREATININE 1.01 12/27/2017   BUN 24 (H) 12/27/2017   CO2 27 12/27/2017   TSH 1.89 12/27/2017   PSA 1.64 01/14/2015   INR 1.17 04/01/2012   HGBA1C 5.4 12/27/2017    Lab Results  Component Value Date   TSH 1.89 12/27/2017   Lab Results  Component Value Date   WBC 13.8 (H) 12/27/2017   HGB 14.4 12/27/2017   HCT 42.6 12/27/2017   MCV 91.2 12/27/2017   PLT 234.0 12/27/2017   Lab Results  Component Value Date   NA 136 12/27/2017   K 4.7 12/27/2017   CO2 27 12/27/2017   GLUCOSE 104 (H) 12/27/2017   BUN 24 (H) 12/27/2017   CREATININE 1.01 12/27/2017   BILITOT 0.6 12/27/2017   ALKPHOS 50 12/27/2017   AST 18  12/27/2017   ALT 17 12/27/2017   PROT 7.4 12/27/2017   ALBUMIN 4.0 12/27/2017   CALCIUM 9.6 12/27/2017   ANIONGAP 9 10/11/2017   GFR 77.41 12/27/2017   Lab Results  Component Value Date   CHOL 232 (H) 12/27/2017   Lab Results  Component Value Date   HDL 47.10 12/27/2017   Lab Results  Component Value Date   LDLCALC 169 (H) 12/27/2017   Lab Results  Component Value Date   TRIG 83.0 12/27/2017   Lab Results  Component Value Date   CHOLHDL 5 12/27/2017   Lab Results  Component Value Date   HGBA1C 5.4 12/27/2017         Assessment & Plan:   Problem List Items Addressed This Visit    Hyperlipidemia, mixed    Encouraged heart healthy diet, increase exercise, avoid trans fats, consider a krill oil cap daily      Relevant Orders   Lipid panel (Completed)   Essential hypertension    Well controlled, no changes to meds. Encouraged heart healthy diet such as the DASH diet and exercise as tolerated.       Relevant Orders   CBC with Differential/Platelet (Completed)   Comprehensive metabolic panel (Completed)   TSH (Completed)   CAD (coronary atherosclerotic disease)    He continues to follow closely with cardiology, [name]      Preventative health care    Patient encouraged to maintain heart healthy diet, regular exercise, adequate sleep. Consider daily probiotics. Take medications as prescribed      Osteoporosis    Encouraged to get adequate exercise, calcium and vitamin d intake      Low vitamin D level    Check level today      Relevant Orders   VITAMIN D 25 Hydroxy (Vit-D Deficiency, Fractures) (Completed)   Hyperglycemia    hgba1c acceptable, minimize simple carbs. Increase exercise as tolerated.       Relevant Orders   Hemoglobin A1c (Completed)   Kidney stone    Check UA      Relevant Orders   Urinalysis (Completed)   H/O: rheumatic fever    Other Visit Diagnoses    Vitamin D deficiency    -  Primary   Relevant Orders   VITAMIN D  25 Hydroxy (Vit-D Deficiency, Fractures) (Completed)      I have discontinued Earnie L. Dang's linaclotide, fluconazole, ezetimibe, azithromycin, ondansetron, potassium chloride, and dicyclomine. I am also having him maintain his ICAPS MV, omeprazole, Ranibizumab (LUCENTIS IO), latanoprost, clobetasol, ELIQUIS, tamsulosin, propafenone, brimonidine, and metoprolol tartrate.  No orders of the defined types were placed in this encounter.   CMA served as Education administrator during this visit. History,  Physical and Plan performed by medical provider. Documentation and orders reviewed and attested to.  Penni Homans, MD

## 2017-12-27 NOTE — Assessment & Plan Note (Signed)
hgba1c acceptable, minimize simple carbs. Increase exercise as tolerated.  

## 2017-12-27 NOTE — Assessment & Plan Note (Signed)
Encouraged heart healthy diet, increase exercise, avoid trans fats, consider a krill oil cap daily 

## 2017-12-29 DIAGNOSIS — H401124 Primary open-angle glaucoma, left eye, indeterminate stage: Secondary | ICD-10-CM | POA: Diagnosis not present

## 2017-12-29 DIAGNOSIS — H25813 Combined forms of age-related cataract, bilateral: Secondary | ICD-10-CM | POA: Diagnosis not present

## 2018-01-01 NOTE — Assessment & Plan Note (Signed)
He continues to follow closely with cardiology, [name]

## 2018-01-01 NOTE — Assessment & Plan Note (Signed)
Patient encouraged to maintain heart healthy diet, regular exercise, adequate sleep. Consider daily probiotics. Take medications as prescribed 

## 2018-01-07 ENCOUNTER — Encounter: Payer: Medicare Other | Admitting: Family Medicine

## 2018-01-20 DIAGNOSIS — H353111 Nonexudative age-related macular degeneration, right eye, early dry stage: Secondary | ICD-10-CM | POA: Diagnosis not present

## 2018-01-20 DIAGNOSIS — H353221 Exudative age-related macular degeneration, left eye, with active choroidal neovascularization: Secondary | ICD-10-CM | POA: Diagnosis not present

## 2018-01-20 DIAGNOSIS — H35363 Drusen (degenerative) of macula, bilateral: Secondary | ICD-10-CM | POA: Diagnosis not present

## 2018-02-16 ENCOUNTER — Other Ambulatory Visit: Payer: Self-pay | Admitting: Family Medicine

## 2018-02-23 DIAGNOSIS — M546 Pain in thoracic spine: Secondary | ICD-10-CM | POA: Diagnosis not present

## 2018-02-23 DIAGNOSIS — M48062 Spinal stenosis, lumbar region with neurogenic claudication: Secondary | ICD-10-CM | POA: Diagnosis not present

## 2018-02-23 DIAGNOSIS — M5136 Other intervertebral disc degeneration, lumbar region: Secondary | ICD-10-CM | POA: Diagnosis not present

## 2018-02-23 DIAGNOSIS — R030 Elevated blood-pressure reading, without diagnosis of hypertension: Secondary | ICD-10-CM | POA: Diagnosis not present

## 2018-02-23 DIAGNOSIS — M4726 Other spondylosis with radiculopathy, lumbar region: Secondary | ICD-10-CM | POA: Diagnosis not present

## 2018-02-23 DIAGNOSIS — M5416 Radiculopathy, lumbar region: Secondary | ICD-10-CM | POA: Diagnosis not present

## 2018-02-23 DIAGNOSIS — Z6830 Body mass index (BMI) 30.0-30.9, adult: Secondary | ICD-10-CM | POA: Diagnosis not present

## 2018-03-05 DIAGNOSIS — I5032 Chronic diastolic (congestive) heart failure: Secondary | ICD-10-CM

## 2018-03-05 HISTORY — DX: Chronic diastolic (congestive) heart failure: I50.32

## 2018-03-18 DIAGNOSIS — H401122 Primary open-angle glaucoma, left eye, moderate stage: Secondary | ICD-10-CM | POA: Diagnosis not present

## 2018-03-22 ENCOUNTER — Inpatient Hospital Stay (HOSPITAL_COMMUNITY)
Admission: EM | Admit: 2018-03-22 | Discharge: 2018-03-25 | DRG: 291 | Disposition: A | Discharge status: Home / Self Care | Payer: Medicare Other | Attending: Internal Medicine | Admitting: Internal Medicine

## 2018-03-22 ENCOUNTER — Inpatient Hospital Stay (HOSPITAL_COMMUNITY): Payer: Medicare Other

## 2018-03-22 ENCOUNTER — Emergency Department (HOSPITAL_BASED_OUTPATIENT_CLINIC_OR_DEPARTMENT_OTHER): Admit: 2018-03-22 | Discharge: 2018-03-22 | Disposition: A | Discharge status: Home / Self Care | Payer: Medicare Other

## 2018-03-22 ENCOUNTER — Emergency Department (HOSPITAL_COMMUNITY): Payer: Medicare Other

## 2018-03-22 ENCOUNTER — Encounter (HOSPITAL_COMMUNITY): Payer: Self-pay | Admitting: *Deleted

## 2018-03-22 ENCOUNTER — Other Ambulatory Visit: Payer: Self-pay

## 2018-03-22 DIAGNOSIS — M79609 Pain in unspecified limb: Secondary | ICD-10-CM

## 2018-03-22 DIAGNOSIS — Z79899 Other long term (current) drug therapy: Secondary | ICD-10-CM | POA: Diagnosis not present

## 2018-03-22 DIAGNOSIS — J440 Chronic obstructive pulmonary disease with acute lower respiratory infection: Secondary | ICD-10-CM | POA: Diagnosis present

## 2018-03-22 DIAGNOSIS — E876 Hypokalemia: Secondary | ICD-10-CM | POA: Diagnosis present

## 2018-03-22 DIAGNOSIS — H409 Unspecified glaucoma: Secondary | ICD-10-CM | POA: Diagnosis present

## 2018-03-22 DIAGNOSIS — Z7901 Long term (current) use of anticoagulants: Secondary | ICD-10-CM | POA: Diagnosis not present

## 2018-03-22 DIAGNOSIS — J189 Pneumonia, unspecified organism: Secondary | ICD-10-CM

## 2018-03-22 DIAGNOSIS — I480 Paroxysmal atrial fibrillation: Secondary | ICD-10-CM

## 2018-03-22 DIAGNOSIS — I110 Hypertensive heart disease with heart failure: Secondary | ICD-10-CM | POA: Diagnosis present

## 2018-03-22 DIAGNOSIS — H3530 Unspecified macular degeneration: Secondary | ICD-10-CM | POA: Diagnosis present

## 2018-03-22 DIAGNOSIS — Z888 Allergy status to other drugs, medicaments and biological substances status: Secondary | ICD-10-CM | POA: Diagnosis not present

## 2018-03-22 DIAGNOSIS — R609 Edema, unspecified: Secondary | ICD-10-CM

## 2018-03-22 DIAGNOSIS — I5031 Acute diastolic (congestive) heart failure: Secondary | ICD-10-CM | POA: Diagnosis not present

## 2018-03-22 DIAGNOSIS — M7989 Other specified soft tissue disorders: Secondary | ICD-10-CM

## 2018-03-22 DIAGNOSIS — M810 Age-related osteoporosis without current pathological fracture: Secondary | ICD-10-CM | POA: Diagnosis present

## 2018-03-22 DIAGNOSIS — J181 Lobar pneumonia, unspecified organism: Secondary | ICD-10-CM

## 2018-03-22 DIAGNOSIS — I509 Heart failure, unspecified: Secondary | ICD-10-CM

## 2018-03-22 DIAGNOSIS — K219 Gastro-esophageal reflux disease without esophagitis: Secondary | ICD-10-CM | POA: Diagnosis present

## 2018-03-22 DIAGNOSIS — I482 Chronic atrial fibrillation: Secondary | ICD-10-CM | POA: Diagnosis present

## 2018-03-22 DIAGNOSIS — Z87891 Personal history of nicotine dependence: Secondary | ICD-10-CM

## 2018-03-22 DIAGNOSIS — Z881 Allergy status to other antibiotic agents status: Secondary | ICD-10-CM

## 2018-03-22 DIAGNOSIS — I2510 Atherosclerotic heart disease of native coronary artery without angina pectoris: Secondary | ICD-10-CM | POA: Diagnosis present

## 2018-03-22 DIAGNOSIS — N400 Enlarged prostate without lower urinary tract symptoms: Secondary | ICD-10-CM | POA: Diagnosis present

## 2018-03-22 DIAGNOSIS — I5041 Acute combined systolic (congestive) and diastolic (congestive) heart failure: Secondary | ICD-10-CM | POA: Diagnosis not present

## 2018-03-22 DIAGNOSIS — R0602 Shortness of breath: Secondary | ICD-10-CM | POA: Diagnosis not present

## 2018-03-22 DIAGNOSIS — I4891 Unspecified atrial fibrillation: Secondary | ICD-10-CM

## 2018-03-22 DIAGNOSIS — K5730 Diverticulosis of large intestine without perforation or abscess without bleeding: Secondary | ICD-10-CM | POA: Diagnosis not present

## 2018-03-22 DIAGNOSIS — I5043 Acute on chronic combined systolic (congestive) and diastolic (congestive) heart failure: Secondary | ICD-10-CM | POA: Diagnosis present

## 2018-03-22 DIAGNOSIS — R109 Unspecified abdominal pain: Secondary | ICD-10-CM | POA: Diagnosis not present

## 2018-03-22 DIAGNOSIS — R1011 Right upper quadrant pain: Secondary | ICD-10-CM | POA: Diagnosis not present

## 2018-03-22 HISTORY — DX: Unspecified atrial fibrillation: I48.91

## 2018-03-22 HISTORY — DX: Edema, unspecified: R60.9

## 2018-03-22 HISTORY — DX: Lobar pneumonia, unspecified organism: J18.1

## 2018-03-22 LAB — CBC WITH DIFFERENTIAL/PLATELET
Basophils Absolute: 0 10*3/uL (ref 0.0–0.1)
Basophils Relative: 0 %
Eosinophils Absolute: 0.1 10*3/uL (ref 0.0–0.7)
Eosinophils Relative: 1 %
HCT: 35.5 % — ABNORMAL LOW (ref 39.0–52.0)
Hemoglobin: 12.2 g/dL — ABNORMAL LOW (ref 13.0–17.0)
Lymphocytes Relative: 18 %
Lymphs Abs: 1.8 10*3/uL (ref 0.7–4.0)
MCH: 31.5 pg (ref 26.0–34.0)
MCHC: 34.4 g/dL (ref 30.0–36.0)
MCV: 91.7 fL (ref 78.0–100.0)
Monocytes Absolute: 0.8 10*3/uL (ref 0.1–1.0)
Monocytes Relative: 9 %
Neutro Abs: 7 10*3/uL (ref 1.7–7.7)
Neutrophils Relative %: 72 %
Platelets: 214 10*3/uL (ref 150–400)
RBC: 3.87 MIL/uL — ABNORMAL LOW (ref 4.22–5.81)
RDW: 13.4 % (ref 11.5–15.5)
WBC: 9.7 10*3/uL (ref 4.0–10.5)

## 2018-03-22 LAB — BRAIN NATRIURETIC PEPTIDE: B Natriuretic Peptide: 417 pg/mL — ABNORMAL HIGH (ref 0.0–100.0)

## 2018-03-22 LAB — BASIC METABOLIC PANEL
Anion gap: 9 (ref 5–15)
BUN: 11 mg/dL (ref 6–20)
CO2: 27 mmol/L (ref 22–32)
Calcium: 7.7 mg/dL — ABNORMAL LOW (ref 8.9–10.3)
Chloride: 105 mmol/L (ref 101–111)
Creatinine, Ser: 0.89 mg/dL (ref 0.61–1.24)
GFR calc Af Amer: >60 mL/min (ref >60)
GFR calc non Af Amer: >60 mL/min (ref >60)
Glucose, Bld: 95 mg/dL (ref 65–99)
Potassium: 3.2 mmol/L — ABNORMAL LOW (ref 3.5–5.1)
Sodium: 141 mmol/L (ref 135–145)

## 2018-03-22 LAB — TROPONIN I
Troponin I: <0.03 ng/mL (ref <0.03)
Troponin I: <0.03 ng/mL (ref <0.03)

## 2018-03-22 LAB — D-DIMER, QUANTITATIVE: D-Dimer, Quant: 0.73 ug/mL-FEU — ABNORMAL HIGH (ref 0.00–0.50)

## 2018-03-22 MED ORDER — BRIMONIDINE TARTRATE 0.2 % OP SOLN
1.0000 [drp] | Freq: Two times a day (BID) | OPHTHALMIC | Status: DC
  Administered 2018-03-22 – 2018-03-25 (×6): 1 [drp] via OPHTHALMIC
  Filled 2018-03-22 (×2): qty 5

## 2018-03-22 MED ORDER — ENOXAPARIN SODIUM 40 MG/0.4ML ~~LOC~~ SOLN
40.0000 mg | SUBCUTANEOUS | Status: DC
  Administered 2018-03-22: 40 mg via SUBCUTANEOUS
  Filled 2018-03-22: qty 0.4

## 2018-03-22 MED ORDER — SODIUM CHLORIDE 0.9 % IV SOLN
1.0000 g | INTRAVENOUS | Status: DC
  Administered 2018-03-23: 1 g via INTRAVENOUS
  Filled 2018-03-22: qty 1

## 2018-03-22 MED ORDER — LATANOPROST 0.005 % OP SOLN
1.0000 [drp] | Freq: Every day | OPHTHALMIC | Status: DC
  Administered 2018-03-22 – 2018-03-24 (×3): 1 [drp] via OPHTHALMIC
  Filled 2018-03-22 (×2): qty 2.5

## 2018-03-22 MED ORDER — DOXYCYCLINE HYCLATE 100 MG PO TABS
100.0000 mg | ORAL_TABLET | Freq: Once | ORAL | Status: AC
  Administered 2018-03-22: 100 mg via ORAL
  Filled 2018-03-22: qty 1

## 2018-03-22 MED ORDER — IOPAMIDOL (ISOVUE-300) INJECTION 61%
100.0000 mL | Freq: Once | INTRAVENOUS | Status: AC | PRN
  Administered 2018-03-22: 100 mL via INTRAVENOUS

## 2018-03-22 MED ORDER — IPRATROPIUM-ALBUTEROL 0.5-2.5 (3) MG/3ML IN SOLN
3.0000 mL | Freq: Once | RESPIRATORY_TRACT | Status: AC
  Administered 2018-03-22: 3 mL via RESPIRATORY_TRACT
  Filled 2018-03-22: qty 3

## 2018-03-22 MED ORDER — APIXABAN 5 MG PO TABS
5.0000 mg | ORAL_TABLET | Freq: Two times a day (BID) | ORAL | Status: DC
  Administered 2018-03-22 – 2018-03-25 (×6): 5 mg via ORAL
  Filled 2018-03-22 (×6): qty 1

## 2018-03-22 MED ORDER — TRAZODONE HCL 50 MG PO TABS
50.0000 mg | ORAL_TABLET | Freq: Every day | ORAL | Status: DC
  Administered 2018-03-22 – 2018-03-24 (×3): 50 mg via ORAL
  Filled 2018-03-22 (×3): qty 1

## 2018-03-22 MED ORDER — IOPAMIDOL (ISOVUE-300) INJECTION 61%: 30.0000 mL | Freq: Once | INTRAVENOUS | Status: DC | PRN

## 2018-03-22 MED ORDER — FUROSEMIDE 10 MG/ML IJ SOLN
20.0000 mg | Freq: Every day | INTRAMUSCULAR | Status: DC
  Administered 2018-03-22 – 2018-03-23 (×2): 20 mg via INTRAVENOUS
  Filled 2018-03-22 (×2): qty 2

## 2018-03-22 MED ORDER — IOHEXOL 300 MG/ML  SOLN
30.0000 mL | Freq: Once | INTRAMUSCULAR | Status: AC | PRN
  Administered 2018-03-22: 30 mL via ORAL

## 2018-03-22 MED ORDER — METOPROLOL TARTRATE 25 MG PO TABS
37.5000 mg | ORAL_TABLET | Freq: Two times a day (BID) | ORAL | Status: DC
  Administered 2018-03-22 – 2018-03-25 (×6): 37.5 mg via ORAL
  Filled 2018-03-22 (×6): qty 2

## 2018-03-22 MED ORDER — POTASSIUM CHLORIDE CRYS ER 10 MEQ PO TBCR
10.0000 meq | EXTENDED_RELEASE_TABLET | Freq: Every day | ORAL | Status: DC
  Administered 2018-03-22 – 2018-03-24 (×3): 10 meq via ORAL
  Filled 2018-03-22 (×3): qty 1

## 2018-03-22 MED ORDER — HYDROMORPHONE HCL 1 MG/ML IJ SOLN
0.5000 mg | INTRAMUSCULAR | Status: DC | PRN
  Administered 2018-03-22: 0.5 mg via INTRAVENOUS
  Filled 2018-03-22: qty 0.5

## 2018-03-22 MED ORDER — SODIUM CHLORIDE 0.9 % IV SOLN
1.0000 g | Freq: Once | INTRAVENOUS | Status: AC
  Administered 2018-03-22: 1 g via INTRAVENOUS
  Filled 2018-03-22: qty 10

## 2018-03-22 MED ORDER — IOPAMIDOL (ISOVUE-300) INJECTION 61%
INTRAVENOUS | Status: AC
  Filled 2018-03-22: qty 100

## 2018-03-22 MED ORDER — ONDANSETRON HCL 4 MG/2ML IJ SOLN
4.0000 mg | Freq: Four times a day (QID) | INTRAMUSCULAR | Status: DC | PRN
  Administered 2018-03-22 (×2): 4 mg via INTRAVENOUS
  Filled 2018-03-22 (×2): qty 2

## 2018-03-22 MED ORDER — MORPHINE SULFATE (PF) 4 MG/ML IV SOLN
4.0000 mg | Freq: Once | INTRAVENOUS | Status: AC
  Administered 2018-03-22: 4 mg via INTRAVENOUS
  Filled 2018-03-22: qty 1

## 2018-03-22 NOTE — Progress Notes (Signed)
Right lower extremity venous duplex completed. There is no evidence of a DVT or Baker's cyst. [place] 03/22/2018 9:51 AM

## 2018-03-22 NOTE — H&P (Signed)
History and Physical    Ryan [name] JJK:[id number] DOB: 1946/02/21 DOA: 03/22/2018  PCP: [name], MD Patient coming from: Home patient lives at home with his wife  Chief Complaint: Cough and shortness of breath  HPI: Ryan [name] is a 72 y.o. male with medical history significant of COPD, atrial fibrillation, rheumatic fever, admitted with cough and shortness of breath.  He reports a cough and shortness of breath is increased at night.  Denies any fever.  Denies any chest pain.  Patient had one episode of nausea and vomiting after coming to the floor.  He reports a weight gain of 18 pounds in the last 1 year.  They attribute this to changing 1 of his medications by his cardiologist.  He is able to walk in the park but he has not done that in few days back due to right lower extremity edema.  He complains of right lower extremity edema more than the left lower extremity edema.  And that is new since his medication change made by cardiology.  He also reports that he had a blockage in his colon which was a polyp and polyp was removed as a part of the colon was removed .  Dr. Watt Climes is his GI doctor and Dr. Lovena Le is his cardiologist.  Denies any diarrhea or constipation.  His last echocardiogram was 2 years ago his ejection fraction was normal at that time.    ED Course: He received Rocephin in the ER.  Ultrasound of the right lower extremity was done due to positive d-dimer with showed no acute DVT.  Sodium 141 potassium 3.2 BUN 11 creatinine 0.89, his BNP was 417 troponin was less than 0.03, count 9.7 hemoglobin 12.2 platelet count 214.  D-dimer was 0.73 last TSH from March 2019 was 1.89.  Chest x-ray showed scarring at the right lung base suspect early pneumonia superimposed in this area with small right pleural effusion.  Follow-up chest x-ray recommended in 3 to 4 weeks after trial of antibiotics to exclude malignancy.  Review of Systems: As per HPI otherwise all other systems  reviewed and are negative  Ambulatory Status: At baseline he ambulates without any walker.  But recently has not been walking much due to increasing lower extremity edema.  Past Medical History:  Diagnosis Date  . Anemia   . Atrial flutter ([place]) 03/31/2012   converted spontaneously to sinus  . CAD (coronary artery disease)    reportedly moderate CAD; managed medically  . COPD (chronic obstructive pulmonary disease) ([place])   . Eczema 08/03/2014  . GERD (gastroesophageal reflux disease)   . H/O: rheumatic fever   . Heart murmur   . Heart murmur   . High risk medication use    on amiodarone since 03/31/2012  . History of colonoscopy   . Hyperglycemia 06/21/2017  . Hyperlipidemia   . Hypertension   . Kidney stone 06/21/2017  . Kidney stones   . Low vitamin D level 06/21/2017  . Macular degeneration   . Osteoporosis 05/31/2016    Past Surgical History:  Procedure Laterality Date  . APPENDECTOMY    . CARDIAC CATHETERIZATION  04/22/2011   moderate left main and RCA stenosis not significant by FFR and IVUS on medical therapy  . CERVICAL DISCECTOMY     C5,6,7 disc fused  with plate and 5 1" screws  . CHOLECYSTECTOMY    . FOOT SURGERY     right calcification removed from top of foot  . knee cartiledge Right  right knee  . MOUTH SURGERY     periodontal surgery, bridges, splint in front,     Social History   Socioeconomic History  . Marital status: Married    Spouse name: Not on file  . Number of children: 0  . Years of education: Not on file  . Highest education level: Not on file  Occupational History  . Occupation: retired    Fish farm manager: RETIRED  Social Needs  . Financial resource strain: Not on file  . Food insecurity:    Worry: Not on file    Inability: Not on file  . Transportation needs:    Medical: Not on file    Non-medical: Not on file  Tobacco Use  . Smoking status: Former Smoker    Packs/day: 0.50    Years: 50.00    Pack years: 25.00    Types: Cigarettes      Last attempt to quit: 05/01/2013    Years since quitting: 4.8  . Smokeless tobacco: Former Systems developer  . Tobacco comment: using [employer]ig[employer]ldc  Substance and Sexual Activity  . Alcohol use: No  . Drug use: No  . Sexual activity: Yes    Comment: lives with wife, no dietary restrictions  Lifestyle  . Physical activity:    Days per week: Not on file    Minutes per session: Not on file  . Stress: Not on file  Relationships  . Social connections:    Talks on phone: Not on file    Gets together: Not on file    Attends religious service: Not on file    Active member of club or organization: Not on file    Attends meetings of clubs or organizations: Not on file    Relationship status: Not on file  . Intimate partner violence:    Fear of current or ex partner: Not on file    Emotionally abused: Not on file    Physically abused: Not on file    Forced sexual activity: Not on file  Other Topics Concern  . Not on file  Social History Narrative  . Not on file    Allergies  Allergen Reactions  . Guaifenesin Diarrhea and Anaphylaxis  . Other Nausea And Vomiting and Swelling    Other reaction(s): Other (See Comments) Stomach cramps Eye drops made eyes swell CIPROFLOXIN-no reaction noted GUAIFENESIN PSEUDOEPHEDRIN- no reaction noted  . Polymyxin B-Trimethoprim Swelling    Eye drops made eyes swell  . Codeine Rash, Itching and Hives    Other reaction(s): Other (See Comments) No reaction noted  . Guaifenesin & Derivatives     Other reaction(s): Hallucinations Other reaction(s): Hallucinations, Other (See Comments) Other reaction(s): Hallucinations   . Polymyxin B Other (See Comments)    Swelling Eye swelling  Other reaction(s): Other (See Comments) Eye swelling  Other reaction(s): Other, Other (See Comments) Swelling Eye swelling  Swelling Swelling   . Statins Other (See Comments)    Muscle aches, Simvastatin,Atorvstatin,  Other reaction(s): Other (See Comments) Muscle cramps   Muscle aches, Simvastatin,Atorvstatin, Muscle aches, Simvastatin,Atorvstatin, Muscle aches Muscle cramps   Other reaction(s): Other, Other (See Comments) Muscle aches and cramps Muscle aches   . Mucinex D [Pseudoephedrine-Guaifenesin Er] Nausea And Vomiting    Stomach cramps  . Pseudoephedrine Hives    Stomach cramps Other reaction(s): Other (See Comments) Stomach cramps  . Pseudoephedrine-Guaifenesin Nausea And Vomiting    Stomach cramps  . Rosuvastatin Calcium Other (See Comments)    Muscle aches  . Simvastatin Other (See  Comments)    Muscle aches and cramps  . Tapentadol Other (See Comments)    unknown Other reaction(s): Other (See Comments) unknown No reaction noted  . Ciprofloxacin Hives, Itching, Nausea Only and Rash  . Codeine Phosphate Itching and Rash  . Moxifloxacin Other (See Comments) and Nausea Only    Stomach cramps Other reaction(s): Other (See Comments) headaches Stomach cramps No reaction noted  . Rofecoxib Other (See Comments) and Nausea Only    Stomach cramping     Family History  Problem Relation Age of Onset  . Heart disease Mother   . Diabetes Mother   . Cirrhosis Mother   . Emphysema Mother        never smoked but 2nd hand through her spouse  . Hypertension Mother   . Macular degeneration Mother   . Heart disease Father   . Cancer Father        prostate  . Hyperlipidemia Father   . Hypertension Father   . Varicose Veins Father   . Heart attack Father   . Peripheral vascular disease Father   . Heart disease Sister   . Arthritis Sister   . Hyperlipidemia Sister   . Obesity Sister   . Macular degeneration Sister   . Heart disease Brother        5 stents  . Hyperlipidemia Brother   . Macular degeneration Maternal Grandfather   . Cirrhosis Sister   . Obesity Sister   . Arthritis Sister   . Heart disease Sister   . Obesity Sister   . Liver disease Unknown   . Prostate cancer Unknown   . Coronary artery disease Unknown        Prior to Admission medications   Medication Sig Start Date End Date Taking? Authorizing Provider  brimonidine (ALPHAGAN) 0.2 % ophthalmic solution INT 1 GTT IN OS BID 09/10/17  Yes [provider]  cholecalciferol (VITAMIN D) 1000 units tablet Take 5,000 Units by mouth every Monday, Wednesday, and Friday.   Yes [provider]  clobetasol (TEMOVATE) 0.05 % external solution APPLY 1 APPLICATION TOPICALLY TWICE DAILY 11/20/16  Yes [name], MD  ELIQUIS 5 MG TABS tablet TAKE 1 TABLET BY MOUTH TWO  TIMES DAILY 06/16/17  Yes Evans Lance, MD  fluconazole (DIFLUCAN) 100 MG tablet Take 100 mg by mouth as needed (thrush from back injection of steroid).   Yes [provider]  latanoprost (XALATAN) 0.005 % ophthalmic solution Place 1 drop into the left eye at bedtime.    Yes [provider]  metoprolol tartrate (LOPRESSOR) 25 MG tablet TAKE ONE-HALF TABLET BY  MOUTH TWO TIMES DAILY 10/18/17  Yes Evans Lance, MD  Multiple Vitamins-Minerals (ICAPS MV) TABS Take 1 tablet by mouth 2 (two) times daily.    Yes [provider]  omeprazole (PRILOSEC OTC) 20 MG tablet Take 20 mg by mouth daily.   Yes [provider]  propafenone (RYTHMOL) 225 MG tablet Take 1 tablet (225 mg total) by mouth 2 (two) times daily. 09/22/17  Yes Evans Lance, MD  Ranibizumab (LUCENTIS IO) Inject into the eye as directed. (Macular Degeneration)   Yes [provider]  tamsulosin (FLOMAX) 0.4 MG CAPS capsule Take 1 capsule (0.4 mg total) by mouth daily. Patient taking differently: Take 0.4 mg by mouth as needed (kidney Stones).  07/25/17  Yes Hedges, Dellis Filbert, PA-C  clobetasol (TEMOVATE) 0.05 % external solution APPLY EXTERNALLY TO THE AFFECTED AREA TWICE DAILY 02/17/18   Penni Homans  A, MD    Physical Exam: Vitals:   03/22/18 1200 03/22/18 1324 03/22/18 1412 03/22/18 1520  BP: (!) 144/73  (!) 145/77 (!) 152/87  Pulse: 88  83 83  Resp: (!) 31  20   Temp:     98.1 F (36.7 C)  TempSrc:    Oral  SpO2: 94% 96% 92% 98%  Weight:    80.9 kg (178 lb 5.6 oz)  Height:    5\' 5"  (1.651 m)     General:  Appears calm and comfortable EyES  PERRL, EOMI, normal lids, iris ENT:  grossly normal hearing, lips & tongue, mmm Neck:  no LAD, masses or thyromegaly Cardiovascular: RRR, no m/r/g. No LE edema.  Respiratory: LEFT BASE crackles  Abdomen:  soft, ntnd, NABS Skin:  no rash or induration seen on limited exam Musculoskeletal:3 plus rle edema 1 plus lle edema Psychiatric:  grossly normal mood and affect, speech fluent and appropriate, AOx3 Neurologic:  CN 2-12 grossly intact, moves all extremities in coordinated fashion, sensation intact  Labs on Admission: I have personally reviewed following labs and imaging studies  CBC: Recent Labs  Lab 03/22/18 0855  WBC 9.7  NEUTROABS 7.0  HGB 12.2*  HCT 35.5*  MCV 91.7  PLT 626   Basic Metabolic Panel: Recent Labs  Lab 03/22/18 0855  NA 141  K 3.2*  CL 105  CO2 27  GLUCOSE 95  BUN 11  CREATININE 0.89  CALCIUM 7.7*   GFR: Estimated Creatinine Clearance: 74.6 mL/min (by C-G formula based on SCr of 0.89 mg/dL). Liver Function Tests: No results for input(s): AST, ALT, ALKPHOS, BILITOT, PROT, ALBUMIN in the last 168 hours. No results for input(s): LIPASE, AMYLASE in the last 168 hours. No results for input(s): AMMONIA in the last 168 hours. Coagulation Profile: No results for input(s): INR, PROTIME in the last 168 hours. Cardiac Enzymes: Recent Labs  Lab 03/22/18 0855  TROPONINI <0.03   BNP (last 3 results) No results for input(s): PROBNP in the last 8760 hours. HbA1C: No results for input(s): HGBA1C in the last 72 hours. CBG: No results for input(s): GLUCAP in the last 168 hours. Lipid Profile: No results for input(s): CHOL, HDL, LDLCALC, TRIG, CHOLHDL, LDLDIRECT in the last 72 hours. Thyroid Function Tests: No results for input(s): TSH, T4TOTAL, FREET4, T3FREE, THYROIDAB in the  last 72 hours. Anemia Panel: No results for input(s): VITAMINB12, FOLATE, FERRITIN, TIBC, IRON, RETICCTPCT in the last 72 hours. Urine analysis:    Component Value Date/Time   COLORURINE YELLOW 12/27/2017 [place] 12/27/2017 1157   LABSPEC 1.020 12/27/2017 1157   PHURINE 6.0 12/27/2017 1157   GLUCOSEU NEGATIVE 12/27/2017 1157   HGBUR NEGATIVE 12/27/2017 1157   HGBUR negative 03/15/2009 0804   BILIRUBINUR NEGATIVE 12/27/2017 1157   BILIRUBINUR n 12/01/2013 1136   KETONESUR NEGATIVE 12/27/2017 1157   PROTEINUR NEGATIVE 08/07/2017 1412   UROBILINOGEN 0.2 12/27/2017 1157   NITRITE NEGATIVE 12/27/2017 1157   LEUKOCYTESUR NEGATIVE 12/27/2017 1157    Creatinine Clearance: Estimated Creatinine Clearance: 74.6 mL/min (by C-G formula based on SCr of 0.89 mg/dL).  Sepsis Labs: @LABRCNTIP (procalcitonin:4,lacticidven:4) )No results found for this or any previous visit (from the past 240 hour(s)).   Radiological Exams on Admission: Dg Chest 2 View  Result Date: 03/22/2018 CLINICAL DATA:  Shortness of breath EXAM: CHEST - 2 VIEW COMPARISON:  March 27, 2017 FINDINGS: There is scarring in the right base. There is a small right pleural effusion with subtle increased opacity in  this area, suspicious for pneumonia. Lungs elsewhere clear. Heart size and pulmonary vascularity normal. No adenopathy. No bone lesions. There is postoperative change in the lower cervical region. IMPRESSION: Scarring right base. Suspect early pneumonia superimposed in this area with small right pleural effusion. Lungs elsewhere clear. Stable cardiac silhouette. Followup PA and lateral chest radiographs recommended in 3-4 weeks following trial of antibiotic therapy to ensure resolution and exclude underlying malignancy. Electronically Signed   By: Lowella Grip III M.D.   On: 03/22/2018 10:30    EKG: Independently reviewed.   Assessment/Plan Active Problems:   CHF (congestive heart failure) ([place])   1]  new onset CHF-patient admitted with increasing lower extremity edema shortness of breath and weight gain.  BNP is elevated over 400.  Patient has not received any diuretics so far.  I will start him on small dose of Lasix.  Order echocardiogram to.  ConsultsC HMG.  Cycle cardiac enzymes.  2] lower extremity edema right more than left patient has 3+ right lower extremity edema and 1+ left lower extremity edema but the recent history of abdominal surgery I will obtain a CT of the abdomen and pelvis to see if there is anything pressing against his right leg vessels.  Doppler of the right lower extremity negative for DVT.  3] chronic atrial fibrillation restart Eliquis continue Lopressor and Rythmol  4] history of BPH restart Flomax.  5] history of glaucoma restart eyedrops.      DVT prophylaxis: Eliquis Code Status: Full code Family Communication: Discussed with wife who was in the room Disposition Plan: TBD Consults called: None Admission status: Inpatient   [name] MD [facility]  If 7PM-7AM, please contact night-coverage [email or web address] Password [facility]  03/22/2018, 5:38 PM

## 2018-03-22 NOTE — ED Triage Notes (Signed)
Pt complains of right leg pain and swelling x 3 weeks. Pt also complains of cough, chest tightness and tachypnea x 2 days.

## 2018-03-22 NOTE — ED Notes (Signed)
ED TO INPATIENT HANDOFF REPORT  Name/Age/Gender Ryan [name] 72 y.o. male  Code Status    Code Status Orders  (From admission, onward)        Start     Ordered   03/22/18 1358  Full code  Continuous     03/22/18 1357    Code Status History    Date Active Date Inactive Code Status Order ID Comments User Context   02/26/2014 0323 02/26/2014 1607 Full Code [id number]  [name], MD Inpatient      Home/SNF/Other Home  Chief Complaint Rt Leg Swelling  Level of Care/Admitting Diagnosis ED Disposition    ED Disposition Condition [place] [facility] Area: [facility] [[id number]]  Level of Care: Telemetry [5]  Admit to tele based on following criteria: Complex arrhythmia (Bradycardia/Tachycardia)  Diagnosis: CHF (congestive heart failure) [facility]) [[id number]]  Admitting Physician: [name] [[id number]]  Attending Physician: [name] [[id number]]  Estimated length of stay: 3 - 4 days  Certification:: I certify this patient will need inpatient services for at least 2 midnights  PT Class (Do Not Modify): Inpatient [101]  PT Acc Code (Do Not Modify): Private [1]       Medical History Past Medical History:  Diagnosis Date  . Anemia   . Atrial flutter ([place]) 03/31/2012   converted spontaneously to sinus  . CAD (coronary artery disease)    reportedly moderate CAD; managed medically  . COPD (chronic obstructive pulmonary disease) ([place])   . Eczema 08/03/2014  . GERD (gastroesophageal reflux disease)   . H/O: rheumatic fever   . Heart murmur   . Heart murmur   . High risk medication use    on amiodarone since 03/31/2012  . History of colonoscopy   . Hyperglycemia 06/21/2017  . Hyperlipidemia   . Hypertension   . Kidney stone 06/21/2017  . Kidney stones   . Low vitamin D level 06/21/2017  . Macular degeneration   . Osteoporosis 05/31/2016    Allergies Allergies  Allergen Reactions  . Guaifenesin Diarrhea and Anaphylaxis   . Other Nausea And Vomiting and Swelling    Other reaction(s): Other (See Comments) Stomach cramps Eye drops made eyes swell CIPROFLOXIN-no reaction noted GUAIFENESIN PSEUDOEPHEDRIN- no reaction noted  . Polymyxin B-Trimethoprim Swelling    Eye drops made eyes swell  . Codeine Rash, Itching and Hives    Other reaction(s): Other (See Comments) No reaction noted  . Guaifenesin & Derivatives     Other reaction(s): Hallucinations Other reaction(s): Hallucinations, Other (See Comments) Other reaction(s): Hallucinations   . Polymyxin B Other (See Comments)    Swelling Eye swelling  Other reaction(s): Other (See Comments) Eye swelling  Other reaction(s): Other, Other (See Comments) Swelling Eye swelling  Swelling Swelling   . Statins Other (See Comments)    Muscle aches, Simvastatin,Atorvstatin,  Other reaction(s): Other (See Comments) Muscle cramps  Muscle aches, Simvastatin,Atorvstatin, Muscle aches, Simvastatin,Atorvstatin, Muscle aches Muscle cramps   Other reaction(s): Other, Other (See Comments) Muscle aches and cramps Muscle aches   . Mucinex D [Pseudoephedrine-Guaifenesin Er] Nausea And Vomiting    Stomach cramps  . Pseudoephedrine Hives    Stomach cramps Other reaction(s): Other (See Comments) Stomach cramps  . Pseudoephedrine-Guaifenesin Nausea And Vomiting    Stomach cramps  . Rosuvastatin Calcium Other (See Comments)    Muscle aches  . Simvastatin Other (See Comments)    Muscle aches and cramps  . Tapentadol Other (See Comments)    unknown  Other reaction(s): Other (See Comments) unknown No reaction noted  . Ciprofloxacin Hives, Itching, Nausea Only and Rash  . Codeine Phosphate Itching and Rash  . Moxifloxacin Other (See Comments) and Nausea Only    Stomach cramps Other reaction(s): Other (See Comments) headaches Stomach cramps No reaction noted  . Rofecoxib Other (See Comments) and Nausea Only    Stomach cramping     IV  Location/Drains/Wounds Patient Lines/Drains/Airways Status   Active Line/Drains/Airways    None          Labs/Imaging Results for orders placed or performed during the hospital encounter of 03/22/18 (from the past 48 hour(s))  D-dimer, quantitative (not at [facility])     Status: Abnormal   Collection Time: 03/22/18  8:55 AM  Result Value Ref Range   D-Dimer, Quant 0.73 (H) 0.00 - 0.50 ug/mL-FEU    Comment: (NOTE) At the manufacturer cut-off of 0.50 ug/mL FEU, this assay has been documented to exclude PE with a sensitivity and negative predictive value of 97 to 99%.  At this time, this assay has not been approved by the FDA to exclude DVT/VTE. Results should be correlated with clinical presentation. Performed at [facility], [place] [address], [place], [place] [address]   Basic metabolic panel     Status: Abnormal   Collection Time: 03/22/18  8:55 AM  Result Value Ref Range   Sodium 141 135 - 145 mmol/L   Potassium 3.2 (L) 3.5 - 5.1 mmol/L   Chloride 105 101 - 111 mmol/L   CO2 27 22 - 32 mmol/L   Glucose, Bld 95 65 - 99 mg/dL   BUN 11 6 - 20 mg/dL   Creatinine, Ser 0.89 0.61 - 1.24 mg/dL   Calcium 7.7 (L) 8.9 - 10.3 mg/dL   GFR calc non Af Amer >60 >60 mL/min   GFR calc Af Amer >60 >60 mL/min    Comment: (NOTE) The eGFR has been calculated using the CKD EPI equation. This calculation has not been validated in all clinical situations. eGFR's persistently <60 mL/min signify possible Chronic Kidney Disease.    Anion gap 9 5 - 15    Comment: Performed at [facility], [place] [address] [place], [place] [address]  CBC WITH DIFFERENTIAL     Status: Abnormal   Collection Time: 03/22/18  8:55 AM  Result Value Ref Range   WBC 9.7 4.0 - 10.5 K/uL   RBC 3.87 (L) 4.22 - 5.81 MIL/uL   Hemoglobin 12.2 (L) 13.0 - 17.0 g/dL   HCT 35.5 (L) 39.0 - 52.0 %   MCV 91.7 78.0 - 100.0 fL   MCH 31.5 26.0 - 34.0 pg   MCHC 34.4 30.0 - 36.0 g/dL   RDW 13.4 11.5 -  15.5 %   Platelets 214 150 - 400 K/uL   Neutrophils Relative % 72 %   Neutro Abs 7.0 1.7 - 7.7 K/uL   Lymphocytes Relative 18 %   Lymphs Abs 1.8 0.7 - 4.0 K/uL   Monocytes Relative 9 %   Monocytes Absolute 0.8 0.1 - 1.0 K/uL   Eosinophils Relative 1 %   Eosinophils Absolute 0.1 0.0 - 0.7 K/uL   Basophils Relative 0 %   Basophils Absolute 0.0 0.0 - 0.1 K/uL    Comment: Performed at [facility], [place] [address], [place], [place] [address]  Troponin I     Status: None   Collection Time: 03/22/18  8:55 AM  Result Value Ref Range   Troponin  I <0.03 <0.03 ng/mL    Comment: Performed at [facility], [place] [address], [place], [place] [address]  Brain natriuretic peptide     Status: Abnormal   Collection Time: 03/22/18  8:55 AM  Result Value Ref Range   B Natriuretic Peptide 417.0 (H) 0.0 - 100.0 pg/mL    Comment: Performed at [facility], [place] [address], [place], [place] [address]   Dg Chest 2 View  Result Date: 03/22/2018 CLINICAL DATA:  Shortness of breath EXAM: CHEST - 2 VIEW COMPARISON:  March 27, 2017 FINDINGS: There is scarring in the right base. There is a small right pleural effusion with subtle increased opacity in this area, suspicious for pneumonia. Lungs elsewhere clear. Heart size and pulmonary vascularity normal. No adenopathy. No bone lesions. There is postoperative change in the lower cervical region. IMPRESSION: Scarring right base. Suspect early pneumonia superimposed in this area with small right pleural effusion. Lungs elsewhere clear. Stable cardiac silhouette. Followup PA and lateral chest radiographs recommended in 3-4 weeks following trial of antibiotic therapy to ensure resolution and exclude underlying malignancy. Electronically Signed   By: Lowella Grip III M.D.   On: 03/22/2018 10:30    Pending Labs Unresulted Labs (From admission, onward)   Start     Ordered   03/29/18 0500  Creatinine, serum   (enoxaparin (LOVENOX)    CrCl >/= 30 ml/min)  Weekly,   R    Comments:  while on enoxaparin therapy    03/22/18 1357   03/22/18 1357  CBC  (enoxaparin (LOVENOX)    CrCl >/= 30 ml/min)  Once,   R    Comments:  Baseline for enoxaparin therapy IF NOT ALREADY DRAWN.  Notify MD if PLT < 100 K.    03/22/18 1357   03/22/18 1357  Creatinine, serum  (enoxaparin (LOVENOX)    CrCl >/= 30 ml/min)  Once,   R    Comments:  Baseline for enoxaparin therapy IF NOT ALREADY DRAWN.    03/22/18 1357      Vitals/Pain Today's Vitals   03/22/18 1136 03/22/18 1324 03/22/18 1412 03/22/18 1428  BP: 140/83  (!) 145/77   Pulse: 87  83   Resp: 20  20   Temp: 98 F (36.7 C)     TempSrc: Oral     SpO2: 94% 96% 92%   PainSc:    2     Isolation Precautions No active isolations  Medications Medications  enoxaparin (LOVENOX) injection 40 mg (has no administration in time range)  cefTRIAXone (ROCEPHIN) 1 g in sodium chloride 0.9 % 100 mL IVPB (0 g Intravenous Stopped 03/22/18 1428)  doxycycline (VIBRA-TABS) tablet 100 mg (100 mg Oral Given 03/22/18 1203)  morphine 4 MG/ML injection 4 mg (4 mg Intravenous Given 03/22/18 1301)  ipratropium-albuterol (DUONEB) 0.5-2.5 (3) MG/3ML nebulizer solution 3 mL (3 mLs Nebulization Given 03/22/18 1323)    Mobility walks

## 2018-03-22 NOTE — ED Provider Notes (Addendum)
[facility] DEPT Provider Note   CSN: [id number] Arrival date & time: 03/22/18  0749     History   Chief Complaint Chief Complaint  Patient presents with  . Leg Swelling  . Leg Pain  . Chest Pain    HPI Ryan [name] is a 72 y.o. male.  HPI 72 year old male with history of CAD, COPD, A. fib on Eliquis comes in with chief complaint of right leg swelling and cough.  Patient states that his leg swelling started about 3 weeks ago, and the right side is worse in the left side.  Patient denies any history of DVT.  He has also noted some pitting in both of his extremities which is new.  Review of system is negative for any orthopnea, PND.   Patient is also complaining of cough with copious amount of phlegm.  Patient has COPD but it is well controlled and he denies any wheezing.  Review of system is also positive for shortness of breath with exertion.  Patient has epigastric chest pain, which is nonradiating and not provoked by exertion.  In fact the pain typically is provoked by food intake.  Past Medical History:  Diagnosis Date  . Anemia   . Atrial flutter ([place]) 03/31/2012   converted spontaneously to sinus  . CAD (coronary artery disease)    reportedly moderate CAD; managed medically  . COPD (chronic obstructive pulmonary disease) ([place])   . Eczema 08/03/2014  . GERD (gastroesophageal reflux disease)   . H/O: rheumatic fever   . Heart murmur   . Heart murmur   . High risk medication use    on amiodarone since 03/31/2012  . History of colonoscopy   . Hyperglycemia 06/21/2017  . Hyperlipidemia   . Hypertension   . Kidney stone 06/21/2017  . Kidney stones   . Low vitamin D level 06/21/2017  . Macular degeneration   . Osteoporosis 05/31/2016    Patient Active Problem List   Diagnosis Date Noted  . H/O: rheumatic fever 12/27/2017  . Low vitamin D level 06/21/2017  . Hyperglycemia 06/21/2017  . Kidney stone 06/21/2017  . Psoriasis of scalp  01/19/2017  . Osteoporosis 05/31/2016  . BPH (benign prostatic hyperplasia) 01/20/2015  . Erectile dysfunction 01/20/2015  . Rectal bleeding 01/20/2015  . Preventative health care 01/20/2015  . Eczema 08/03/2014  . Sleep apnea 03/01/2014  . Anticoagulation adequate with anticoagulant therapy 03/01/2014  . Pre-syncope 02/26/2014  . Abdominal pain 02/26/2014  . PAD (peripheral artery disease) ([place]) 02/26/2014  . Chronic constipation 03/06/2013  . Easy bruising 03/06/2013  . History of alcohol abuse 04/15/2012  . Atrial flutter with rapid ventricular response ([place]) 03/31/2012  . CAD (coronary atherosclerotic disease) 05/08/2011  . 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 UNS ADVRS EFF UNS RX MEDICINAL&BIOLOGICAL SBSTNC 11/11/2009  . H/O tobacco use, presenting hazards to health 07/29/2009  . COPD GOLD I 07/29/2009  . Hyperlipidemia, mixed 06/20/2007  . Macular degeneration (senile) of retina 06/20/2007  . Essential hypertension 06/20/2007  . GERD 06/16/2007    Past Surgical History:  Procedure Laterality Date  . APPENDECTOMY    . CARDIAC CATHETERIZATION  04/22/2011   moderate left main and RCA stenosis not significant by FFR and IVUS on medical therapy  . CERVICAL DISCECTOMY     C5,6,7 disc fused  with plate and 5 1" screws  . CHOLECYSTECTOMY    . FOOT SURGERY     right calcification removed  from top of foot  . knee cartiledge Right    right knee  . MOUTH SURGERY     periodontal surgery, bridges, splint in front,         Home Medications    Prior to Admission medications   Medication Sig Start Date End Date Taking? Authorizing Provider  brimonidine (ALPHAGAN) 0.2 % ophthalmic solution INT 1 GTT IN OS BID 09/10/17  Yes [provider]  cholecalciferol (VITAMIN D) 1000 units tablet Take 5,000 Units by mouth every Monday, Wednesday, and Friday.   Yes [provider]  clobetasol (TEMOVATE) 0.05 %  external solution APPLY 1 APPLICATION TOPICALLY TWICE DAILY 11/20/16  Yes [name], MD  ELIQUIS 5 MG TABS tablet TAKE 1 TABLET BY MOUTH TWO  TIMES DAILY 06/16/17  Yes Evans Lance, MD  fluconazole (DIFLUCAN) 100 MG tablet Take 100 mg by mouth as needed (thrush from back injection of steroid).   Yes [provider]  latanoprost (XALATAN) 0.005 % ophthalmic solution Place 1 drop into the left eye at bedtime.    Yes [provider]  metoprolol tartrate (LOPRESSOR) 25 MG tablet TAKE ONE-HALF TABLET BY  MOUTH TWO TIMES DAILY 10/18/17  Yes Evans Lance, MD  Multiple Vitamins-Minerals (ICAPS MV) TABS Take 1 tablet by mouth 2 (two) times daily.    Yes [provider]  omeprazole (PRILOSEC OTC) 20 MG tablet Take 20 mg by mouth daily.   Yes [provider]  propafenone (RYTHMOL) 225 MG tablet Take 1 tablet (225 mg total) by mouth 2 (two) times daily. 09/22/17  Yes Evans Lance, MD  Ranibizumab (LUCENTIS IO) Inject into the eye as directed. (Macular Degeneration)   Yes [provider]  tamsulosin (FLOMAX) 0.4 MG CAPS capsule Take 1 capsule (0.4 mg total) by mouth daily. Patient taking differently: Take 0.4 mg by mouth as needed (kidney Stones).  07/25/17  Yes Hedges, Dellis Filbert, PA-C  clobetasol (TEMOVATE) 0.05 % external solution APPLY EXTERNALLY TO THE AFFECTED AREA TWICE DAILY 02/17/18   [name], MD    Family History Family History  Problem Relation Age of Onset  . Heart disease Mother   . Diabetes Mother   . Cirrhosis Mother   . Emphysema Mother        never smoked but 2nd hand through her spouse  . Hypertension Mother   . Macular degeneration Mother   . Heart disease Father   . Cancer Father        prostate  . Hyperlipidemia Father   . Hypertension Father   . Varicose Veins Father   . Heart attack Father   . Peripheral vascular disease Father   . Heart disease Sister   . Arthritis Sister   . Hyperlipidemia Sister   . Obesity  Sister   . Macular degeneration Sister   . Heart disease Brother        5 stents  . Hyperlipidemia Brother   . Macular degeneration Maternal Grandfather   . Cirrhosis Sister   . Obesity Sister   . Arthritis Sister   . Heart disease Sister   . Obesity Sister   . Liver disease Unknown   . Prostate cancer Unknown   . Coronary artery disease Unknown     Social History Social History   Tobacco Use  . Smoking status: Former Smoker    Packs/day: 0.50    Years: 50.00    Pack years: 25.00    Types: Cigarettes    Last  attempt to quit: 05/01/2013    Years since quitting: 4.8  . Smokeless tobacco: Former Systems developer  . Tobacco comment: using [employer]ldc  Substance Use Topics  . Alcohol use: No  . Drug use: No     Allergies   Guaifenesin; Other; Polymyxin b-trimethoprim; Codeine; Guaifenesin & derivatives; Polymyxin b; Statins; Mucinex d [pseudoephedrine-guaifenesin er]; Pseudoephedrine; Pseudoephedrine-guaifenesin; Rosuvastatin calcium; Simvastatin; Tapentadol; Ciprofloxacin; Codeine phosphate; Moxifloxacin; and Rofecoxib   Review of Systems Review of Systems  Constitutional: Positive for activity change.  Respiratory: Positive for cough and shortness of breath.   Gastrointestinal: Negative for nausea and vomiting.  Allergic/Immunologic: Negative for immunocompromised state.  Hematological: Bruises/bleeds easily.  All other systems reviewed and are negative.    Physical Exam Updated Vital Signs BP 140/83 (BP Location: Right Arm)   Pulse 87   Temp 98 F (36.7 C) (Oral)   Resp 20   SpO2 94%   Physical Exam  Constitutional: He is oriented to person, place, and time. He appears well-developed.  HENT:  Head: Atraumatic.  Eyes: Pupils are equal, round, and reactive to light. EOM are normal.  Neck: Neck supple.  Cardiovascular: Normal rate, intact distal pulses and normal pulses. An irregularly irregular rhythm present.  Pulmonary/Chest: Effort normal.  Abdominal: Soft.    Musculoskeletal: Normal range of motion.       Right lower leg: He exhibits tenderness and edema.       Left lower leg: He exhibits edema.  Right lower extremity has calf tenderness and is visibly more edematous than left lower extremity.  Patient has pitting edema in both of his legs  Neurological: He is alert and oriented to person, place, and time.  Skin: Skin is warm.  Nursing note and vitals reviewed.    ED Treatments / Results  Labs (all labs ordered are listed, but only abnormal results are displayed) Labs Reviewed  D-DIMER, QUANTITATIVE (NOT AT [facility]) - Abnormal; Notable for the following components:      Result Value   D-Dimer, Quant 0.73 (*)    All other components within normal limits  BASIC METABOLIC PANEL - Abnormal; Notable for the following components:   Potassium 3.2 (*)    Calcium 7.7 (*)    All other components within normal limits  CBC WITH DIFFERENTIAL/PLATELET - Abnormal; Notable for the following components:   RBC 3.87 (*)    Hemoglobin 12.2 (*)    HCT 35.5 (*)    All other components within normal limits  BRAIN NATRIURETIC PEPTIDE - Abnormal; Notable for the following components:   B Natriuretic Peptide 417.0 (*)    All other components within normal limits  TROPONIN I    EKG EKG Interpretation  Date/Time:  Tuesday March 22 2018 08:19:52 EDT Ventricular Rate:  80 PR Interval:    QRS Duration: 91 QT Interval:  399 QTC Calculation: 461 R Axis:   31 Text Interpretation:  Atrial fibrillation Borderline repolarization abnormality No acute changes Nonspecific ST and T wave abnormality Confirmed by Varney Biles ([id number]) on 03/22/2018 8:55:48 AM   Radiology Dg Chest 2 View  Result Date: 03/22/2018 CLINICAL DATA:  Shortness of breath EXAM: CHEST - 2 VIEW COMPARISON:  March 27, 2017 FINDINGS: There is scarring in the right base. There is a small right pleural effusion with subtle increased opacity in this area, suspicious for pneumonia. Lungs elsewhere  clear. Heart size and pulmonary vascularity normal. No adenopathy. No bone lesions. There is postoperative change in the lower cervical region. IMPRESSION: Scarring right base. Suspect early  pneumonia superimposed in this area with small right pleural effusion. Lungs elsewhere clear. Stable cardiac silhouette. Followup PA and lateral chest radiographs recommended in 3-4 weeks following trial of antibiotic therapy to ensure resolution and exclude underlying malignancy. Electronically Signed   By: Lowella Grip III M.D.   On: 03/22/2018 10:30    Procedures Procedures (including critical care time)  Medications Ordered in ED Medications  ipratropium-albuterol (DUONEB) 0.5-2.5 (3) MG/3ML nebulizer solution 3 mL (has no administration in time range)  cefTRIAXone (ROCEPHIN) 1 g in sodium chloride 0.9 % 100 mL IVPB (1 g Intravenous New Bag/Given 03/22/18 1204)  doxycycline (VIBRA-TABS) tablet 100 mg (100 mg Oral Given 03/22/18 1203)  morphine 4 MG/ML injection 4 mg (4 mg Intravenous Given 03/22/18 1301)     Initial Impression / Assessment and Plan / ED Course  I have reviewed the triage vital signs and the nursing notes.  Pertinent labs & imaging results that were available during my care of the patient were reviewed by me and considered in my medical decision making (see chart for details).  Clinical Course as of Mar 23 1307  Tue Mar 22, 2018  1307 X-rays concerning for pneumonia.  With the new, worsening cough with shortness of breath -we will treat patient as if he has pneumonia.  He has 0 sirs criteria at arrival therefore patient is not septic at the time of admission.  BNP is elevated, making his think that he has a new onset CHF. Patient will be admitted to medicine for optimization.  DG Chest 2 View [AN]    Clinical Course User Index [AN] Varney Biles, MD   Pt comes in with cc of DIB and cough and leg swelling. He has history of CAD, COPD and A. fib on Eliquis.  Patient is noted  to have pitting edema, and his right lower extremity edema is worse than left lower extremity.  There is also new pitting in the lower extremities.  Differential diagnosis for the right lower extremity swelling includes DVT.  There is no evidence of cellulitis on her exam and patient has normal distal pulses over the dorsalis pedis and posterior tibialis.  For the shortness of breath and cough, differential diagnosis includes CHF, PE, ACS.  Evolving pneumonia also possible.    Final Clinical Impressions(s) / ED Diagnoses   Final diagnoses:  Community acquired pneumonia, unspecified laterality  Acute combined systolic and diastolic congestive heart failure [facility])    ED Discharge Orders    None       Varney Biles, MD 03/22/18 1157    Varney Biles, MD 03/22/18 1308

## 2018-03-23 ENCOUNTER — Inpatient Hospital Stay (HOSPITAL_COMMUNITY): Payer: Medicare Other

## 2018-03-23 ENCOUNTER — Encounter (HOSPITAL_COMMUNITY): Payer: Self-pay

## 2018-03-23 DIAGNOSIS — I509 Heart failure, unspecified: Secondary | ICD-10-CM

## 2018-03-23 DIAGNOSIS — I5041 Acute combined systolic (congestive) and diastolic (congestive) heart failure: Secondary | ICD-10-CM

## 2018-03-23 DIAGNOSIS — I480 Paroxysmal atrial fibrillation: Secondary | ICD-10-CM

## 2018-03-23 DIAGNOSIS — J189 Pneumonia, unspecified organism: Secondary | ICD-10-CM

## 2018-03-23 DIAGNOSIS — J181 Lobar pneumonia, unspecified organism: Secondary | ICD-10-CM

## 2018-03-23 LAB — TROPONIN I
Troponin I: <0.03 ng/mL (ref <0.03)
Troponin I: <0.03 ng/mL (ref <0.03)

## 2018-03-23 LAB — ECHOCARDIOGRAM COMPLETE
Height: 65 in
Weight: 2853.63 oz

## 2018-03-23 MED ORDER — FUROSEMIDE 10 MG/ML IJ SOLN
40.0000 mg | Freq: Two times a day (BID) | INTRAMUSCULAR | Status: DC
  Administered 2018-03-23 – 2018-03-24 (×4): 40 mg via INTRAVENOUS
  Filled 2018-03-23 (×4): qty 4

## 2018-03-23 MED ORDER — OMEPRAZOLE MAGNESIUM 20 MG PO TBEC
20.0000 mg | DELAYED_RELEASE_TABLET | Freq: Every day | ORAL | Status: DC
  Administered 2018-03-23: 20 mg via ORAL
  Filled 2018-03-23: qty 1

## 2018-03-23 MED ORDER — AMOXICILLIN-POT CLAVULANATE 875-125 MG PO TABS
1.0000 | ORAL_TABLET | Freq: Two times a day (BID) | ORAL | Status: DC
  Administered 2018-03-24 – 2018-03-25 (×3): 1 via ORAL
  Filled 2018-03-23 (×3): qty 1

## 2018-03-23 MED ORDER — OMEPRAZOLE 20 MG PO CPDR
20.0000 mg | DELAYED_RELEASE_CAPSULE | Freq: Every day | ORAL | Status: DC
  Administered 2018-03-24 – 2018-03-25 (×2): 20 mg via ORAL
  Filled 2018-03-23 (×3): qty 1

## 2018-03-23 MED ORDER — PROPAFENONE HCL 225 MG PO TABS
225.0000 mg | ORAL_TABLET | Freq: Two times a day (BID) | ORAL | Status: DC
  Administered 2018-03-23 – 2018-03-25 (×5): 225 mg via ORAL
  Filled 2018-03-23 (×5): qty 1

## 2018-03-23 MED ORDER — TAMSULOSIN HCL 0.4 MG PO CAPS
0.4000 mg | ORAL_CAPSULE | Freq: Every day | ORAL | Status: DC
  Administered 2018-03-25: 0.4 mg via ORAL
  Filled 2018-03-23 (×2): qty 1

## 2018-03-23 MED ORDER — PANTOPRAZOLE SODIUM 20 MG PO TBEC: 20.0000 mg | DELAYED_RELEASE_TABLET | Freq: Every day | ORAL | Status: DC

## 2018-03-23 MED ORDER — ICAPS MV PO TABS: 1.0000 | ORAL_TABLET | Freq: Two times a day (BID) | ORAL | Status: DC

## 2018-03-23 MED ORDER — VITAMIN D3 25 MCG (1000 UNIT) PO TABS
5000.0000 [IU] | ORAL_TABLET | ORAL | Status: DC
  Administered 2018-03-23 – 2018-03-25 (×2): 5000 [IU] via ORAL
  Filled 2018-03-23 (×2): qty 5

## 2018-03-23 MED ORDER — PROSIGHT PO TABS
1.0000 | ORAL_TABLET | Freq: Every day | ORAL | Status: DC
  Administered 2018-03-23 – 2018-03-25 (×3): 1 via ORAL
  Filled 2018-03-23 (×3): qty 1

## 2018-03-23 NOTE — Consult Note (Addendum)
[facility] Consult  Referring Provider: Dr.Mathews, Benjamine Mola, MD   Primary Care Physician:  [name], MD Primary Gastroenterologist: [facility]  Reason for Consultation:  Post prandial epigastric abdominal pain  HPI: Ryan [name] is a 72 y.o. male was admitted on 03/22/18 with complaints of shortness of breath, right lower leg swelling, weight cane of 18 pounds. Patient states that he recently had an EGD and a colonoscopy in 2019 by Dr. Carolynne Edouard mm splenic flexure polyp, 2 cm sigmoid polyp removed-pathology not available). He has history of chronic idiopathic constipation and was on amitiza and linzess, however, since colonoscopy in January 2019, he has not needed any laxatives and reports regular, one bowel movement a day. He complains of epigastric spasms every time he eats, the intensity of pain is 5 out of 10 and improves when he is not eating. He denies radiation of abdominal pain, association with nausea and 4 episodes of vomiting small amount of clear fluid along with early satiety. He denies acid reflux, difficulty swallowing or pain on swallowing. He denies seeing blood in stool or black stools. In the last few days the consistency of stool is looser but not watery, and the frequency still once a day.  Past Medical History:  Diagnosis Date  . A-fib ([place]) 03/22/2018  . Anemia   . Atrial flutter ([place]) 03/31/2012   converted spontaneously to sinus  . CAD (coronary artery disease)    reportedly moderate CAD; managed medically  . COPD (chronic obstructive pulmonary disease) ([place])   . Eczema 08/03/2014  . Edema 03/22/2018  . GERD (gastroesophageal reflux disease)   . H/O: rheumatic fever   . Heart murmur   . Heart murmur   . High risk medication use    on amiodarone since 03/31/2012  . History of colonoscopy   . Hyperglycemia 06/21/2017  . Hyperlipidemia   . Hypertension   . Kidney stone 06/21/2017  . Kidney stones   . Lobar pneumonia ([place]) 03/22/2018  . Low  vitamin D level 06/21/2017  . Macular degeneration   . Osteoporosis 05/31/2016    Past Surgical History:  Procedure Laterality Date  . APPENDECTOMY    . CARDIAC CATHETERIZATION  04/22/2011   moderate left main and RCA stenosis not significant by FFR and IVUS on medical therapy  . CERVICAL DISCECTOMY     C5,6,7 disc fused  with plate and 5 1" screws  . CHOLECYSTECTOMY    . FOOT SURGERY     right calcification removed from top of foot  . knee cartiledge Right    right knee  . MOUTH SURGERY     periodontal surgery, bridges, splint in front,     Prior to Admission medications   Medication Sig Start Date End Date Taking? Authorizing Provider  brimonidine (ALPHAGAN) 0.2 % ophthalmic solution INT 1 GTT IN OS BID 09/10/17  Yes [provider]  cholecalciferol (VITAMIN D) 1000 units tablet Take 5,000 Units by mouth every Monday, Wednesday, and Friday.   Yes [provider]  clobetasol (TEMOVATE) 0.05 % external solution APPLY 1 APPLICATION TOPICALLY TWICE DAILY 11/20/16  Yes [name], MD  ELIQUIS 5 MG TABS tablet TAKE 1 TABLET BY MOUTH TWO  TIMES DAILY 06/16/17  Yes Evans Lance, MD  fluconazole (DIFLUCAN) 100 MG tablet Take 100 mg by mouth as needed (thrush from back injection of steroid).   Yes [provider]  latanoprost (XALATAN) 0.005 % ophthalmic solution Place 1 drop into the left eye at bedtime.  Yes [provider]  metoprolol tartrate (LOPRESSOR) 25 MG tablet TAKE ONE-HALF TABLET BY  MOUTH TWO TIMES DAILY 10/18/17  Yes Evans Lance, MD  Multiple Vitamins-Minerals (ICAPS MV) TABS Take 1 tablet by mouth 2 (two) times daily.    Yes [provider]  omeprazole (PRILOSEC OTC) 20 MG tablet Take 20 mg by mouth daily.   Yes [provider]  propafenone (RYTHMOL) 225 MG tablet Take 1 tablet (225 mg total) by mouth 2 (two) times daily. 09/22/17  Yes Evans Lance, MD  Ranibizumab (LUCENTIS IO) Inject into the eye as directed.  (Macular Degeneration)   Yes [provider]  tamsulosin (FLOMAX) 0.4 MG CAPS capsule Take 1 capsule (0.4 mg total) by mouth daily. Patient taking differently: Take 0.4 mg by mouth as needed (kidney Stones).  07/25/17  Yes Hedges, Dellis Filbert, PA-C  clobetasol (TEMOVATE) 0.05 % external solution APPLY EXTERNALLY TO THE AFFECTED AREA TWICE DAILY 02/17/18   [name], MD    Current Facility-Administered Medications  Medication Dose Route Frequency Provider Last Rate Last Dose  . [START ON 03/24/2018] amoxicillin-clavulanate (AUGMENTIN) 875-125 MG per tablet 1 tablet  1 tablet Oral BID WC [name], MD      . apixaban Arne Cleveland) tablet 5 mg  5 mg Oral BID [name], MD   5 mg at 03/23/18 0904  . brimonidine (ALPHAGAN) 0.2 % ophthalmic solution 1 drop  1 drop Both Eyes BID [name], MD   1 drop at 03/23/18 0902  . cholecalciferol (VITAMIN D) tablet 5,000 Units  5,000 Units Oral Q M,W,F [name], MD   5,000 Units at 03/23/18 [phone]  . furosemide (LASIX) injection 40 mg  40 mg Intravenous BID Dorothy Spark, MD   40 mg at 03/23/18 1316  . HYDROmorphone (DILAUDID) injection 0.5 mg  0.5 mg Intravenous Q4H PRN [name], MD   0.5 mg at 03/22/18 1820  . latanoprost (XALATAN) 0.005 % ophthalmic solution 1 drop  1 drop Left Eye QHS [name], MD   1 drop at 03/22/18 2342  . metoprolol tartrate (LOPRESSOR) tablet 37.5 mg  37.5 mg Oral BID [name], MD   37.5 mg at 03/23/18 0904  . multivitamin (PROSIGHT) tablet 1 tablet  1 tablet Oral Daily [name], MD   1 tablet at 03/23/18 0905  . omeprazole (PRILOSEC) capsule 20 mg  20 mg Oral Daily [name], MD      . ondansetron [facility]) injection 4 mg  4 mg Intravenous Q6H PRN [name], MD   4 mg at 03/22/18 2129  . potassium chloride (K-DUR,KLOR-CON) CR tablet 10 mEq  10 mEq Oral Daily [name], MD   10 mEq at 03/23/18 0904  .  propafenone (RYTHMOL) tablet 225 mg  225 mg Oral BID [name], MD   225 mg at 03/23/18 9211  . tamsulosin (FLOMAX) capsule 0.4 mg  0.4 mg Oral Daily [name], MD      . traZODone (DESYREL) tablet 50 mg  50 mg Oral QHS [name], MD   50 mg at 03/22/18 2237    Allergies as of 03/22/2018 - Review Complete 03/22/2018  Allergen Reaction Noted  . Guaifenesin Diarrhea and Anaphylaxis 03/21/2014  . Other Nausea And Vomiting and Swelling 11/07/2010  . Polymyxin b-trimethoprim Swelling 01/28/2012  . Codeine Rash, Itching, and Hives 03/21/2007  . Guaifenesin & derivatives  06/08/2013  . Polymyxin b  Other (See Comments) 06/08/2013  . Statins Other (See Comments) 03/21/2007  . Mucinex d [pseudoephedrine-guaifenesin er] Nausea And Vomiting 02/25/2014  . Pseudoephedrine Hives 03/21/2014  . Pseudoephedrine-guaifenesin Nausea And Vomiting 02/25/2014  . Rosuvastatin calcium Other (See Comments) 04/26/2014  . Simvastatin Other (See Comments) 03/21/2007  . Tapentadol Other (See Comments) 11/07/2010  . Ciprofloxacin Hives, Itching, Nausea Only, and Rash 03/21/2007  . Codeine phosphate Itching and Rash 03/21/2007  . Moxifloxacin Other (See Comments) and Nausea Only 11/07/2010  . Rofecoxib Other (See Comments) and Nausea Only 06/08/2013    Family History  Problem Relation Age of Onset  . Heart disease Mother   . Diabetes Mother   . Cirrhosis Mother   . Emphysema Mother        never smoked but 2nd hand through her spouse  . Hypertension Mother   . Macular degeneration Mother   . Heart disease Father   . Cancer Father        prostate  . Hyperlipidemia Father   . Hypertension Father   . Varicose Veins Father   . Heart attack Father   . Peripheral vascular disease Father   . Heart disease Sister   . Arthritis Sister   . Hyperlipidemia Sister   . Obesity Sister   . Macular degeneration Sister   . Heart disease Brother        5 stents  . Hyperlipidemia Brother    . Macular degeneration Maternal Grandfather   . Cirrhosis Sister   . Obesity Sister   . Arthritis Sister   . Heart disease Sister   . Obesity Sister   . Liver disease Unknown   . Prostate cancer Unknown   . Coronary artery disease Unknown     Social History   Socioeconomic History  . Marital status: Married    Spouse name: Not on file  . Number of children: 0  . Years of education: Not on file  . Highest education level: Not on file  Occupational History  . Occupation: retired    Fish farm manager: RETIRED  Social Needs  . Financial resource strain: Not on file  . Food insecurity:    Worry: Not on file    Inability: Not on file  . Transportation needs:    Medical: Not on file    Non-medical: Not on file  Tobacco Use  . Smoking status: Former Smoker    Packs/day: 0.50    Years: 50.00    Pack years: 25.00    Types: Cigarettes    Last attempt to quit: 05/01/2013    Years since quitting: 4.8  . Smokeless tobacco: Former Systems developer  . Tobacco comment: using e-Cig//ldc  Substance and Sexual Activity  . Alcohol use: No  . Drug use: No  . Sexual activity: Yes    Comment: lives with wife, no dietary restrictions  Lifestyle  . Physical activity:    Days per week: Not on file    Minutes per session: Not on file  . Stress: Not on file  Relationships  . Social connections:    Talks on phone: Not on file    Gets together: Not on file    Attends religious service: Not on file    Active member of club or organization: Not on file    Attends meetings of clubs or organizations: Not on file    Relationship status: Not on file  . Intimate partner violence:    Fear of current or ex partner: Not on file  Emotionally abused: Not on file    Physically abused: Not on file    Forced sexual activity: Not on file  Other Topics Concern  . Not on file  Social History Narrative  . Not on file    Review of Systems: Positive for: GI: Described in detail in HPI.    Gen: Denies any fever,  chills, rigors, night sweats, anorexia, fatigue, weakness, malaise, involuntary weight loss, and sleep disorder CV: peripheral edema,Denies chest pain, angina, palpitations, syncope, orthopnea, PND,  and claudication. Resp: Denies dyspnea, cough, sputum, wheezing, coughing up blood. GU : Denies urinary burning, blood in urine, urinary frequency, urinary hesitancy, nocturnal urination, and urinary incontinence. MS: right leg swelling, Denies joint pain.  Denies muscle weakness, cramps, atrophy.  Derm: Denies rash, itching, oral ulcerations, hives, unhealing ulcers.  Psych: Denies depression, anxiety, memory loss, suicidal ideation, hallucinations,  and confusion. Heme: Denies bruising, bleeding, and enlarged lymph nodes. Neuro:  Denies any headaches, dizziness, paresthesias. Endo:  Denies any problems with DM, thyroid, adrenal function.  Physical Exam: Vital signs in last 24 hours: Temp:  [98.1 F (36.7 C)-98.4 F (36.9 C)] 98.4 F (36.9 C) (06/19 0413) Pulse Rate:  [82-83] 83 (06/19 0413) Resp:  [20] 20 (06/19 0413) BP: (114-152)/(63-87) 114/63 (06/19 0413) SpO2:  [90 %-98 %] 90 % (06/19 0413) Weight:  [80.9 kg (178 lb 5.6 oz)] 80.9 kg (178 lb 5.6 oz) (06/18 1520) Last BM Date: 03/22/18  General:   Alert,  Well-developed, well-nourished, pleasant and cooperative in NAD Head:  Normocephalic and atraumatic. Eyes:  Sclera clear, no icterus.   Conjunctiva pink. Ears:  Normal auditory acuity. Nose:  No deformity, discharge,  or lesions. Mouth:  No deformity or lesions.  Oropharynx pink & moist. Neck:  Supple; no masses or thyromegaly. Lungs:  Clear throughout to auscultation.   No wheezes, crackles, or rhonchi. No acute distress. Heart:  Irregular rhythm; no murmurs, clicks, rubs,  or gallops. Extremities:  Right lower leg mild pitting edema Neurologic:  Alert and  oriented x4;  grossly normal neurologically. Skin:  Intact without significant lesions or rashes. Psych:  Alert and  cooperative. Normal mood and affect. Abdomen:  Soft, nontender and nondistended. No masses, hepatosplenomegaly or hernias noted. Normal bowel sounds, without guarding, and without rebound.         Lab Results: Recent Labs    03/22/18 0855  WBC 9.7  HGB 12.2*  HCT 35.5*  PLT 214   BMET Recent Labs    03/22/18 0855  NA 141  K 3.2*  CL 105  CO2 27  GLUCOSE 95  BUN 11  CREATININE 0.89  CALCIUM 7.7*   LFT No results for input(s): PROT, ALBUMIN, AST, ALT, ALKPHOS, BILITOT, BILIDIR, IBILI in the last 72 hours. PT/INR No results for input(s): LABPROT, INR in the last 72 hours.  Studies/Results: Dg Chest 2 View  Result Date: 03/22/2018 CLINICAL DATA:  Shortness of breath EXAM: CHEST - 2 VIEW COMPARISON:  March 27, 2017 FINDINGS: There is scarring in the right base. There is a small right pleural effusion with subtle increased opacity in this area, suspicious for pneumonia. Lungs elsewhere clear. Heart size and pulmonary vascularity normal. No adenopathy. No bone lesions. There is postoperative change in the lower cervical region. IMPRESSION: Scarring right base. Suspect early pneumonia superimposed in this area with small right pleural effusion. Lungs elsewhere clear. Stable cardiac silhouette. Followup PA and lateral chest radiographs recommended in 3-4 weeks following trial of antibiotic therapy to ensure resolution and  exclude underlying malignancy. Electronically Signed   By: Lowella Grip III M.D.   On: 03/22/2018 10:30   Ct Abdomen Pelvis W Contrast  Result Date: 03/22/2018 CLINICAL DATA:  Cough and shortness of breath worse at night. One episode of nausea and vomiting. EXAM: CT ABDOMEN AND PELVIS WITH CONTRAST TECHNIQUE: Multidetector CT imaging of the abdomen and pelvis was performed using the standard protocol following bolus administration of intravenous contrast. CONTRAST:  17mL OMNIPAQUE IOHEXOL 300 MG/ML SOLN, 175mL ISOVUE-300 IOPAMIDOL (ISOVUE-300) INJECTION 61%  COMPARISON:  10/11/2017 FINDINGS: Lower chest: Mild emphysematous disease over the right base. Small bilateral pleural effusions with associated atelectasis right worse than left. Mild cardiomegaly. Calcified plaque over the right coronary artery. Hepatobiliary: Previous cholecystectomy. Subcentimeter hypodensity over the dome of the liver too small to characterize but likely a cyst. Biliary tree is normal. Pancreas: Normal. Spleen: Normal. Adrenals/Urinary Tract: Adrenal glands are normal. Kidneys are normal in size without hydronephrosis or nephrolithiasis. Subcentimeter hypodensity over the mid pole cortex right kidney too small to characterize but likely a cyst. Ureters and bladder are normal. Stomach/Bowel: There is moderate gastric distension. Small bowel is within normal. Previous appendectomy. Diverticulosis of the colon most prominent over the sigmoid colon. Vascular/Lymphatic: Calcified plaque over the abdominal aorta and iliac arteries. No adenopathy. Reproductive: Normal. Other: No free fluid or focal inflammatory change. Musculoskeletal: Mild degenerate change of the spine. IMPRESSION: Small bilateral pleural effusions right worse than left with associated bibasilar atelectasis. No acute findings in the abdomen/pelvis. Mild cardiomegaly with atherosclerotic coronary artery disease. Colonic diverticulosis. Subcentimeter right renal cortical hypodensity too small to characterize but likely a cyst. Aortic Atherosclerosis (ICD10-I70.0). Electronically Signed   By: Marin Olp M.D.   On: 03/22/2018 22:02    Impression: 1.Postprandial epigastric abdominal pain with early satiety CT angiogram of abdomen and pelvis from 10/11/17 showed diffuse atherosclerotic disease of the abdominal aorta, patent but mild narrowing of origin of CELIAC plexus, mild stenosis at origin of SMA with mixed plaque, narrowing at the origin of IMA. Possible wall thickening of the stomach.  2.Atrial fibrillation, coronary active  disease,congestive heart failure  Plan: Patient reports having an EGD and a colonoscopy in the last few months. Will obtain records from Dr. Liliane Channel office for procedures and pathology. 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 As per cardiology consult note, patient to be treated with diuretics to be followed by outpatient cardioversion and will be maintained on Eliquis. Patient will benefit from a repeat CT or MR angiogram for evaluation of mesenteric vessels to see if intervention is needed.    LOS: 1 day   Ronnette Juniper, MD  03/23/2018, 1:59 PM  Pager [phone] If no answer or after 5 PM call [phone]

## 2018-03-23 NOTE — Progress Notes (Signed)
PROGRESS NOTE    Ryan [name]  UEK:[id number] DOB: [birth year] DOA: 03/22/2018 PCP: [name], MD  Brief [place] y.o. male with medical history significant of COPD, atrial fibrillation, rheumatic fever, admitted with cough and shortness of breath.  He reports a cough and shortness of breath is increased at night.  Denies any fever.  Denies any chest pain.  Patient had one episode of nausea and vomiting after coming to the floor.  He reports a weight gain of 18 pounds in the last 1 year.  They attribute this to changing 1 of his medications by his cardiologist.  He is able to walk in the park but he has not done that in few days back due to right lower extremity edema.  He complains of right lower extremity edema more than the left lower extremity edema.  And that is new since his medication change made by cardiology.  He also reports that he had a blockage in his colon which was a polyp and polyp was removed as a part of the colon was removed .  Dr. Watt Climes is his GI doctor and Dr. Lovena Le is his cardiologist.  Denies any diarrhea or constipation.  His last echocardiogram was 2 years ago his ejection fraction was normal at that time.    ED Course: He received Rocephin in the ER.  Ultrasound of the right lower extremity was done due to positive d-dimer with showed no acute DVT.  Sodium 141 potassium 3.2 BUN 11 creatinine 0.89, his BNP was 417 troponin was less than 0.03, count 9.7 hemoglobin 12.2 platelet count 214.  D-dimer was 0.73 last TSH from March 2019 was 1.89.  Chest x-ray showed scarring at the right lung base suspect early pneumonia superimposed in this area with small right pleural effusion.  Follow-up chest x-ray recommended in 3 to 4 weeks after trial of antibiotics to exclude malignancy.   Assessment & Plan:   Active Problems:   CHF (congestive heart failure) (HCC)   Lobar pneumonia (HCC)   A-fib (HCC)   Edema 1] new onset CHF/chronic A. fib-patient admitted with  increasing lower extremity edema shortness of breath and weight gain.  BNP is elevated over 400.  Appreciate cardiology input.  Lasix dose increased to 40 mg twice a day.  Echocardiogram done today with normal ejection fraction.  Continue Eliquis, Lopressor, Rythmol.  Plan for outpatient cardioversion noted.  Ultrasound of the lower extremity no evidence of DVT.  2] epigastric pain?  With projectile vomiting x4.  Gastritis versus peptic ulcer disease- patient had EGD and colonoscopy this year.  CT of the abdomen and pelvis showed diverticulosis without evidence of diverticulitis.  GI consult called.  Continue Prilosec.  3] chronic atrial fibrillation restart Eliquis continue Lopressor and Rythmol  4] history of BPH restart Flomax.  5] history of glaucoma restart eyedrops.  6] question of infiltrates by chest x-ray with cough with no evidence of leukocytosis we will treat him with a short course of antibiotics change to Augmentin for another 4 days and DC.        DVT prophylaxis: Eliquis Code Status: Full code Family Communication: Discussed with wife who was in the room  Disposition Plan TBD  Consultants: None   Procedures: Echocardiogram 03/23/2018 Antimicrobials: Rocephin  Subjective: Feels better decreased edema but continues to have epigastric discomfort and abdominal distention and feels like not able to eat much feels full faster.   Objective: Vitals:   03/22/18 1412 03/22/18 1520 03/22/18 2219 03/23/18 0413  BP: (!) 145/77 (!) 152/87 135/85 114/63  Pulse: 83 83 82 83  Resp: 20  20 20   Temp:  98.1 F (36.7 C) 98.1 F (36.7 C) 98.4 F (36.9 C)  TempSrc:  Oral Oral   SpO2: 92% 98% 94% 90%  Weight:  80.9 kg (178 lb 5.6 oz)    Height:  5\' 5"  (1.651 m)      Intake/Output Summary (Last 24 hours) at 03/23/2018 1228 Last data filed at 03/23/2018 1000 Gross per 24 hour  Intake 550 ml  Output -  Net 550 ml   Filed Weights   03/22/18 1520  Weight: 80.9 kg (178  lb 5.6 oz)    Examination:  General exam: Appears calm and comfortable  Respiratory system: Clear to auscultation. Respiratory effort normal. Cardiovascular system: S1 & S2 heard, RRR. No JVD, murmurs, rubs, gallops or clicks. No pedal edema. Gastrointestinal system: Abdomen is nondistended, soft and nontender. No organomegaly or masses felt. Normal bowel sounds heard. Central nervous system: Alert and oriented. No focal neurological deficits. Extremities: Symmetric 5 x 5 power. Skin: No rashes, lesions or ulcers Psychiatry: Judgement and insight appear normal. Mood & affect appropriate.     Data Reviewed: I have personally reviewed following labs and imaging studies  CBC: Recent Labs  Lab 03/22/18 0855  WBC 9.7  NEUTROABS 7.0  HGB 12.2*  HCT 35.5*  MCV 91.7  PLT 341   Basic Metabolic Panel: Recent Labs  Lab 03/22/18 0855  NA 141  K 3.2*  CL 105  CO2 27  GLUCOSE 95  BUN 11  CREATININE 0.89  CALCIUM 7.7*   GFR: Estimated Creatinine Clearance: 74.6 mL/min (by C-G formula based on SCr of 0.89 mg/dL). Liver Function Tests: No results for input(s): AST, ALT, ALKPHOS, BILITOT, PROT, ALBUMIN in the last 168 hours. No results for input(s): LIPASE, AMYLASE in the last 168 hours. No results for input(s): AMMONIA in the last 168 hours. Coagulation Profile: No results for input(s): INR, PROTIME in the last 168 hours. Cardiac Enzymes: Recent Labs  Lab 03/22/18 0855 03/22/18 1859 03/22/18 2338 03/23/18 0527  TROPONINI <0.03 <0.03 <0.03 <0.03   BNP (last 3 results) No results for input(s): PROBNP in the last 8760 hours. HbA1C: No results for input(s): HGBA1C in the last 72 hours. CBG: No results for input(s): GLUCAP in the last 168 hours. Lipid Profile: No results for input(s): CHOL, HDL, LDLCALC, TRIG, CHOLHDL, LDLDIRECT in the last 72 hours. Thyroid Function Tests: No results for input(s): TSH, T4TOTAL, FREET4, T3FREE, THYROIDAB in the last 72 hours. Anemia  Panel: No results for input(s): VITAMINB12, FOLATE, FERRITIN, TIBC, IRON, RETICCTPCT in the last 72 hours. Sepsis Labs: No results for input(s): PROCALCITON, LATICACIDVEN in the last 168 hours.  No results found for this or any previous visit (from the past 240 hour(s)).       Radiology Studies: Dg Chest 2 View  Result Date: 03/22/2018 CLINICAL DATA:  Shortness of breath EXAM: CHEST - 2 VIEW COMPARISON:  March 27, 2017 FINDINGS: There is scarring in the right base. There is a small right pleural effusion with subtle increased opacity in this area, suspicious for pneumonia. Lungs elsewhere clear. Heart size and pulmonary vascularity normal. No adenopathy. No bone lesions. There is postoperative change in the lower cervical region. IMPRESSION: Scarring right base. Suspect early pneumonia superimposed in this area with small right pleural effusion. Lungs elsewhere clear. Stable cardiac silhouette. Followup PA and lateral chest radiographs recommended in 3-4 weeks following trial of antibiotic  therapy to ensure resolution and exclude underlying malignancy. Electronically Signed   By: Lowella Grip III M.D.   On: 03/22/2018 10:30   Ct Abdomen Pelvis W Contrast  Result Date: 03/22/2018 CLINICAL DATA:  Cough and shortness of breath worse at night. One episode of nausea and vomiting. EXAM: CT ABDOMEN AND PELVIS WITH CONTRAST TECHNIQUE: Multidetector CT imaging of the abdomen and pelvis was performed using the standard protocol following bolus administration of intravenous contrast. CONTRAST:  41mL OMNIPAQUE IOHEXOL 300 MG/ML SOLN, 132mL ISOVUE-300 IOPAMIDOL (ISOVUE-300) INJECTION 61% COMPARISON:  10/11/2017 FINDINGS: Lower chest: Mild emphysematous disease over the right base. Small bilateral pleural effusions with associated atelectasis right worse than left. Mild cardiomegaly. Calcified plaque over the right coronary artery. Hepatobiliary: Previous cholecystectomy. Subcentimeter hypodensity over the  dome of the liver too small to characterize but likely a cyst. Biliary tree is normal. Pancreas: Normal. Spleen: Normal. Adrenals/Urinary Tract: Adrenal glands are normal. Kidneys are normal in size without hydronephrosis or nephrolithiasis. Subcentimeter hypodensity over the mid pole cortex right kidney too small to characterize but likely a cyst. Ureters and bladder are normal. Stomach/Bowel: There is moderate gastric distension. Small bowel is within normal. Previous appendectomy. Diverticulosis of the colon most prominent over the sigmoid colon. Vascular/Lymphatic: Calcified plaque over the abdominal aorta and iliac arteries. No adenopathy. Reproductive: Normal. Other: No free fluid or focal inflammatory change. Musculoskeletal: Mild degenerate change of the spine. IMPRESSION: Small bilateral pleural effusions right worse than left with associated bibasilar atelectasis. No acute findings in the abdomen/pelvis. Mild cardiomegaly with atherosclerotic coronary artery disease. Colonic diverticulosis. Subcentimeter right renal cortical hypodensity too small to characterize but likely a cyst. Aortic Atherosclerosis (ICD10-I70.0). Electronically Signed   By: Marin Olp M.D.   On: 03/22/2018 22:02        Scheduled Meds: . [START ON 03/24/2018] amoxicillin-clavulanate  1 tablet Oral BID WC  . apixaban  5 mg Oral BID  . brimonidine  1 drop Both Eyes BID  . cholecalciferol  5,000 Units Oral Q M,W,F  . furosemide  40 mg Intravenous BID  . latanoprost  1 drop Left Eye QHS  . metoprolol tartrate  37.5 mg Oral BID  . multivitamin  1 tablet Oral Daily  . omeprazole  20 mg Oral Daily  . potassium chloride  10 mEq Oral Daily  . propafenone  225 mg Oral BID  . tamsulosin  0.4 mg Oral Daily  . traZODone  50 mg Oral QHS   Continuous Infusions:   LOS: 1 day    [name], MD [facility]  If 7PM-7AM, please contact night-coverage [email or web address] Password [facility] 03/23/2018, 12:28 PM

## 2018-03-23 NOTE — Consult Note (Addendum)
CARDIOLOGY CONSULT NOTE   Patient ID: Ryan [name] MRN: [id number], DOB/AGE: March 09, 1946   Admit date: 03/22/2018 Date of Consult: 03/23/2018  Primary Physician: [name], MD Primary Cardiologist: [name]  Reason for consult:  CHF  Ryan [name] is a 72 y.o. male who is being seen today for the evaluation of CHF at the request of Dr [name].  Problem List  Past Medical History:  Diagnosis Date  . A-fib ([place]) 03/22/2018  . Anemia   . Atrial flutter ([place]) 03/31/2012   converted spontaneously to sinus  . CAD (coronary artery disease)    reportedly moderate CAD; managed medically  . COPD (chronic obstructive pulmonary disease) ([place])   . Eczema 08/03/2014  . Edema 03/22/2018  . GERD (gastroesophageal reflux disease)   . H/O: rheumatic fever   . Heart murmur   . Heart murmur   . High risk medication use    on amiodarone since 03/31/2012  . History of colonoscopy   . Hyperglycemia 06/21/2017  . Hyperlipidemia   . Hypertension   . Kidney stone 06/21/2017  . Kidney stones   . Lobar pneumonia ([place]) 03/22/2018  . Low vitamin D level 06/21/2017  . Macular degeneration   . Osteoporosis 05/31/2016    Past Surgical History:  Procedure Laterality Date  . APPENDECTOMY    . CARDIAC CATHETERIZATION  04/22/2011   moderate left main and RCA stenosis not significant by FFR and IVUS on medical therapy  . CERVICAL DISCECTOMY     C5,6,7 disc fused  with plate and 5 1" screws  . CHOLECYSTECTOMY    . FOOT SURGERY     right calcification removed from top of foot  . knee cartiledge Right    right knee  . MOUTH SURGERY     periodontal surgery, bridges, splint in front,      Allergies  Allergies  Allergen Reactions  . Guaifenesin Diarrhea and Anaphylaxis  . Other Nausea And Vomiting and Swelling    Other reaction(s): Other (See Comments) Stomach cramps Eye drops made eyes swell CIPROFLOXIN-no reaction noted GUAIFENESIN PSEUDOEPHEDRIN- no reaction noted  .  Polymyxin B-Trimethoprim Swelling    Eye drops made eyes swell  . Codeine Rash, Itching and Hives    Other reaction(s): Other (See Comments) No reaction noted  . Guaifenesin & Derivatives     Other reaction(s): Hallucinations Other reaction(s): Hallucinations, Other (See Comments) Other reaction(s): Hallucinations   . Polymyxin B Other (See Comments)    Swelling Eye swelling  Other reaction(s): Other (See Comments) Eye swelling  Other reaction(s): Other, Other (See Comments) Swelling Eye swelling  Swelling Swelling   . Statins Other (See Comments)    Muscle aches, Simvastatin,Atorvstatin,  Other reaction(s): Other (See Comments) Muscle cramps  Muscle aches, Simvastatin,Atorvstatin, Muscle aches, Simvastatin,Atorvstatin, Muscle aches Muscle cramps   Other reaction(s): Other, Other (See Comments) Muscle aches and cramps Muscle aches   . Mucinex D [Pseudoephedrine-Guaifenesin Er] Nausea And Vomiting    Stomach cramps  . Pseudoephedrine Hives    Stomach cramps Other reaction(s): Other (See Comments) Stomach cramps  . Pseudoephedrine-Guaifenesin Nausea And Vomiting    Stomach cramps  . Rosuvastatin Calcium Other (See Comments)    Muscle aches  . Simvastatin Other (See Comments)    Muscle aches and cramps  . Tapentadol Other (See Comments)    unknown Other reaction(s): Other (See Comments) unknown No reaction noted  . Ciprofloxacin Hives, Itching, Nausea Only and Rash  . Codeine Phosphate Itching and Rash  .  Moxifloxacin Other (See Comments) and Nausea Only    Stomach cramps Other reaction(s): Other (See Comments) headaches Stomach cramps No reaction noted  . Rofecoxib Other (See Comments) and Nausea Only    Stomach cramping     HPI   72 year old gentleman with prior medical history of paroxysmal atrial fibrillation on Eliquis, hypertension, COPD, chronic diastolic heart failure who has been followed by Dr.Taylor, last seen in December 2018 when he was  doing well, maintaining normal sinus rhythm on Rythmol, controlled blood pressure in NYHA class I.  He was scheduled for annual visit in a year.  He [facility] on March 22, 2018 with cough and shortness of breath predominantly at night, this has escalated to vomiting after prolonged coughing, he has also noticed 18 pounds weight gain in the last several months, worsening lower extremity edema, proximal nocturnal dyspnea. He recently underwent polypectomy and partial colectomy.   In the ED he was started on IV antibiotics, vascular duplex ruled out acute DVT, his creatinine was normal, BNP elevated at 417, troponin negative x4.  Chest x-ray shows early pneumonia and mild CHF.  He was on no diuretics at home.  He has noticed on his Gar[employer] watch bed in the last 3 to 4 weeks his heart rate has been up from his usual 60s to 80s almost 100.  This is the same timeline as when he noticed to have lower extremity edema and worsening shortness of breath.  I have personally reviewed echocardiogram showed LVEF 55 to 60%, moderate concentric LVH, mild mitral and tricuspid regurgitation, severe left atrial enlargement.  Diastolic dysfunction unable to assess secondary to atrial fibrillation during acquisition.  Inpatient Medications  . apixaban  5 mg Oral BID  . brimonidine  1 drop Both Eyes BID  . cholecalciferol  5,000 Units Oral Q M,W,F  . furosemide  20 mg Intravenous Daily  . latanoprost  1 drop Left Eye QHS  . metoprolol tartrate  37.5 mg Oral BID  . multivitamin  1 tablet Oral Daily  . omeprazole  20 mg Oral Daily  . potassium chloride  10 mEq Oral Daily  . propafenone  225 mg Oral BID  . tamsulosin  0.4 mg Oral Daily  . traZODone  50 mg Oral QHS    Family History Family History  Problem Relation Age of Onset  . Heart disease Mother   . Diabetes Mother   . Cirrhosis Mother   . Emphysema Mother        never smoked but 2nd hand through her spouse  . Hypertension Mother     . Macular degeneration Mother   . Heart disease Father   . Cancer Father        prostate  . Hyperlipidemia Father   . Hypertension Father   . Varicose Veins Father   . Heart attack Father   . Peripheral vascular disease Father   . Heart disease Sister   . Arthritis Sister   . Hyperlipidemia Sister   . Obesity Sister   . Macular degeneration Sister   . Heart disease Brother        5 stents  . Hyperlipidemia Brother   . Macular degeneration Maternal Grandfather   . Cirrhosis Sister   . Obesity Sister   . Arthritis Sister   . Heart disease Sister   . Obesity Sister   . Liver disease Unknown   . Prostate cancer Unknown   . Coronary artery disease Unknown  Social History Social History   Socioeconomic History  . Marital status: Married    Spouse name: Not on file  . Number of children: 0  . Years of education: Not on file  . Highest education level: Not on file  Occupational History  . Occupation: retired    Fish farm manager: RETIRED  Social Needs  . Financial resource strain: Not on file  . Food insecurity:    Worry: Not on file    Inability: Not on file  . Transportation needs:    Medical: Not on file    Non-medical: Not on file  Tobacco Use  . Smoking status: Former Smoker    Packs/day: 0.50    Years: 50.00    Pack years: 25.00    Types: Cigarettes    Last attempt to quit: 05/01/2013    Years since quitting: 4.8  . Smokeless tobacco: Former Systems developer  . Tobacco comment: using e-Cig//ldc  Substance and Sexual Activity  . Alcohol use: No  . Drug use: No  . Sexual activity: Yes    Comment: lives with wife, no dietary restrictions  Lifestyle  . Physical activity:    Days per week: Not on file    Minutes per session: Not on file  . Stress: Not on file  Relationships  . Social connections:    Talks on phone: Not on file    Gets together: Not on file    Attends religious service: Not on file    Active member of club or organization: Not on file    Attends  meetings of clubs or organizations: Not on file    Relationship status: Not on file  . Intimate partner violence:    Fear of current or ex partner: Not on file    Emotionally abused: Not on file    Physically abused: Not on file    Forced sexual activity: Not on file  Other Topics Concern  . Not on file  Social History Narrative  . Not on file     Review of Systems  General:  No chills, fever, night sweats or weight changes.  Cardiovascular:  No chest pain, dyspnea on exertion, edema, orthopnea, palpitations, paroxysmal nocturnal dyspnea. Dermatological: No rash, lesions/masses Respiratory: No cough, dyspnea Urologic: No hematuria, dysuria Abdominal:   No nausea, vomiting, diarrhea, bright red blood per rectum, melena, or hematemesis Neurologic:  No visual changes, wkns, changes in mental status. All other systems reviewed and are otherwise negative except as noted above.  Physical Exam  Blood pressure 114/63, pulse 83, temperature 98.4 F (36.9 C), resp. rate 20, height 5\' 5"  (1.651 m), weight 178 lb 5.6 oz (80.9 kg), SpO2 90 %.  General: Pleasant, NAD Psych: Normal affect. Neuro: Alert and oriented X 3. Moves all extremities spontaneously. HEENT: Normal  Neck: Supple without bruits or JVD. Lungs:  Resp regular and unlabored, crackles at the lung bases worse right than left. Heart: iRRR no s3, s4, or murmurs. Abdomen: Soft, non-tender, non-distended, BS + x 4.  Extremities: No clubbing, cyanosis, bilateral 2+ lower extremity edema. DP/PT/Radials 2+ and equal bilaterally.  Labs  Recent Labs    03/22/18 0855 03/22/18 1859 03/22/18 2338 03/23/18 0527  TROPONINI <0.03 <0.03 <0.03 <0.03   Lab Results  Component Value Date   WBC 9.7 03/22/2018   HGB 12.2 (L) 03/22/2018   HCT 35.5 (L) 03/22/2018   MCV 91.7 03/22/2018   PLT 214 03/22/2018    Recent Labs  Lab 03/22/18 0855  NA 141  K  3.2*  CL 105  CO2 27  BUN 11  CREATININE 0.89  CALCIUM 7.7*  GLUCOSE 95    Lab Results  Component Value Date   CHOL 232 (H) 12/27/2017   HDL 47.10 12/27/2017   LDLCALC 169 (H) 12/27/2017   TRIG 83.0 12/27/2017   Lab Results  Component Value Date   DDIMER 0.73 (H) 03/22/2018   Invalid input(s): POCBNP  Radiology/Studies  Dg Chest 2 View  Result Date: 03/22/2018 CLINICAL DATA:  Shortness of breath EXAM: CHEST - 2 VIEW COMPARISON:  March 27, 2017 FINDINGS: There is scarring in the right base. There is a small right pleural effusion with subtle increased opacity in this area, suspicious for pneumonia. Lungs elsewhere clear. Heart size and pulmonary vascularity normal. No adenopathy. No bone lesions. There is postoperative change in the lower cervical region. IMPRESSION: Scarring right base. Suspect early pneumonia superimposed in this area with small right pleural effusion. Lungs elsewhere clear. Stable cardiac silhouette. Followup PA and lateral chest radiographs recommended in 3-4 weeks following trial of antibiotic therapy to ensure resolution and exclude underlying malignancy. Electronically Signed   By: Lowella Grip III M.D.   On: 03/22/2018 10:30   Ct Abdomen Pelvis W Contrast  Result Date: 03/22/2018 CLINICAL DATA:  Cough and shortness of breath worse at night. One episode of nausea and vomiting. EXAM: CT ABDOMEN AND PELVIS WITH CONTRAST TECHNIQUE: Multidetector CT imaging of the abdomen and pelvis was performed using the standard protocol following bolus administration of intravenous contrast. CONTRAST:  29mL OMNIPAQUE IOHEXOL 300 MG/ML SOLN, 149mL ISOVUE-300 IOPAMIDOL (ISOVUE-300) INJECTION 61% COMPARISON:  10/11/2017 FINDINGS: Lower chest: Mild emphysematous disease over the right base. Small bilateral pleural effusions with associated atelectasis right worse than left. Mild cardiomegaly. Calcified plaque over the right coronary artery. Hepatobiliary: Previous cholecystectomy. Subcentimeter hypodensity over the dome of the liver too small to characterize  but likely a cyst. Biliary tree is normal. Pancreas: Normal. Spleen: Normal. Adrenals/Urinary Tract: Adrenal glands are normal. Kidneys are normal in size without hydronephrosis or nephrolithiasis. Subcentimeter hypodensity over the mid pole cortex right kidney too small to characterize but likely a cyst. Ureters and bladder are normal. Stomach/Bowel: There is moderate gastric distension. Small bowel is within normal. Previous appendectomy. Diverticulosis of the colon most prominent over the sigmoid colon. Vascular/Lymphatic: Calcified plaque over the abdominal aorta and iliac arteries. No adenopathy. Reproductive: Normal. Other: No free fluid or focal inflammatory change. Musculoskeletal: Mild degenerate change of the spine. IMPRESSION: Small bilateral pleural effusions right worse than left with associated bibasilar atelectasis. No acute findings in the abdomen/pelvis. Mild cardiomegaly with atherosclerotic coronary artery disease. Colonic diverticulosis. Subcentimeter right renal cortical hypodensity too small to characterize but likely a cyst. Aortic Atherosclerosis (ICD10-I70.0). Electronically Signed   By: Marin Olp M.D.   On: 03/22/2018 22:02   Echocardiogram - I have personally reviewed echocardiogram showed LVEF 55 to 60%, moderate concentric LVH, mild mitral and tricuspid regurgitation, severe left atrial enlargement.  Diastolic dysfunction unable to assess secondary to atrial fibrillation during acquisition.  ECG: atrial fibrillation, non-specific T T wave abnormalities, personally reviewed.    ASSESSMENT AND PLAN  1.  Acute on chronic diastolic CHF -Current weight 178 pounds, this line weight in December 2018 176 pounds, however in 2017 161 pounds. -I would increase Lasix to 40 mg IV twice daily, monitor creatinine closely, currently normal, replace potassium, none drawn today, -Plan is to diurese and discharge ideally in a day or 2 -Culprit for his CHF seems to be  A. fib with RVR, we  will plan for diuresis and then outpatient cardioversion given he missed 1 dose of Eliquis, we will plan in about 3 to 4 weeks.  2.  Atrial fibrillation - - despite propafenone and metoprolol, -He was in sinus in December 2019 in in January 2019 -Rate controlled -This might be a culprit for CHF exacerbation -Continue Eliquis, he missed one dose yesterday in the morning when he got to the hospital and it was not given to him.  3.  Hypertension -Well-controlled   Signed, Ena Dawley, MD, [facility] 03/23/2018, 10:56 AM

## 2018-03-23 NOTE — Progress Notes (Signed)
Patients GI doctor from [facility] was notified for all pathology, EGD, Colonoscopy and surgical procedures done this year to be faxed to [facility] hospital for Therisa Doyne, MD. Patients medical release form was signed and placed in chart which was faxed to [facility] for documents.

## 2018-03-23 NOTE — Progress Notes (Signed)
  Echocardiogram 2D Echocardiogram has been performed.  Ryan [name] F 03/23/2018, 10:08 AM

## 2018-03-24 ENCOUNTER — Encounter (HOSPITAL_COMMUNITY): Payer: Self-pay | Admitting: Radiology

## 2018-03-24 ENCOUNTER — Inpatient Hospital Stay (HOSPITAL_COMMUNITY): Payer: Medicare Other

## 2018-03-24 DIAGNOSIS — I5031 Acute diastolic (congestive) heart failure: Secondary | ICD-10-CM

## 2018-03-24 LAB — BASIC METABOLIC PANEL
Anion gap: 11 (ref 5–15)
BUN: 11 mg/dL (ref 6–20)
CO2: 31 mmol/L (ref 22–32)
Calcium: 7.3 mg/dL — ABNORMAL LOW (ref 8.9–10.3)
Chloride: 99 mmol/L — ABNORMAL LOW (ref 101–111)
Creatinine, Ser: 1.11 mg/dL (ref 0.61–1.24)
GFR calc Af Amer: >60 mL/min (ref >60)
GFR calc non Af Amer: >60 mL/min (ref >60)
Glucose, Bld: 95 mg/dL (ref 65–99)
Potassium: 2.8 mmol/L — ABNORMAL LOW (ref 3.5–5.1)
Sodium: 141 mmol/L (ref 135–145)

## 2018-03-24 MED ORDER — POTASSIUM CHLORIDE CRYS ER 20 MEQ PO TBCR
40.0000 meq | EXTENDED_RELEASE_TABLET | Freq: Once | ORAL | Status: AC
  Administered 2018-03-24: 40 meq via ORAL
  Filled 2018-03-24: qty 2

## 2018-03-24 MED ORDER — POTASSIUM CHLORIDE 10 MEQ/100ML IV SOLN
10.0000 meq | INTRAVENOUS | Status: AC
  Administered 2018-03-24 (×2): 10 meq via INTRAVENOUS
  Filled 2018-03-24 (×2): qty 100

## 2018-03-24 MED ORDER — IOPAMIDOL (ISOVUE-370) INJECTION 76%
INTRAVENOUS | Status: AC
  Filled 2018-03-24: qty 100

## 2018-03-24 MED ORDER — POTASSIUM CHLORIDE CRYS ER 20 MEQ PO TBCR
20.0000 meq | EXTENDED_RELEASE_TABLET | Freq: Two times a day (BID) | ORAL | Status: DC
  Administered 2018-03-24 – 2018-03-25 (×2): 20 meq via ORAL
  Filled 2018-03-24 (×2): qty 1

## 2018-03-24 MED ORDER — IOPAMIDOL (ISOVUE-370) INJECTION 76%
100.0000 mL | Freq: Once | INTRAVENOUS | Status: AC | PRN
  Administered 2018-03-24: 100 mL via INTRAVENOUS

## 2018-03-24 NOTE — Plan of Care (Signed)
Patient stable during 7 a to 7 p shift, tearful at times d/t not being able to eat without experiencing abdominal discomfort.  Wife at bedside all shift.  Patient continues to diurese.

## 2018-03-24 NOTE — Progress Notes (Signed)
[facility] Progress Note  Subjective: The patient has been having upper abdominal pain and generalized abdominal pain postprandially.  Actually it starts in the middle of his meal.  He has had an EGD 3 months ago which was unrevealing for source of this pain.  He had a colonoscopy 3 months ago with findings of a colon polyp that was removed.  His GI physician at [facility] has been unable to explain the pain.  Dr. Therisa Doyne saw the patient yesterday and raised the suspicion of mesenteric ischemia.  Objective: Vital signs in last 24 hours: Temp:  [98.1 F (36.7 C)-98.5 F (36.9 C)] 98.5 F (36.9 C) (06/20 0434) Pulse Rate:  [72-102] 102 (06/20 0755) Resp:  [18-20] 18 (06/20 0434) BP: (106-133)/(68-93) 133/93 (06/20 0755) SpO2:  [94 %-97 %] 96 % (06/20 0755) Weight change:    PE:  No distress  Abdomen soft nontender  Lab Results: Results for orders placed or performed during the hospital encounter of 03/22/18 (from the past 24 hour(s))  Basic metabolic panel     Status: Abnormal   Collection Time: 03/24/18  5:00 AM  Result Value Ref Range   Sodium 141 135 - 145 mmol/L   Potassium 2.8 (L) 3.5 - 5.1 mmol/L   Chloride 99 (L) 101 - 111 mmol/L   CO2 31 22 - 32 mmol/L   Glucose, Bld 95 65 - 99 mg/dL   BUN 11 6 - 20 mg/dL   Creatinine, Ser 1.11 0.61 - 1.24 mg/dL   Calcium 7.3 (L) 8.9 - 10.3 mg/dL   GFR calc non Af Amer >60 >60 mL/min   GFR calc Af Amer >60 >60 mL/min   Anion gap 11 5 - 15    Studies/Results: No results found.    Assessment: Abdominal pain.  Rule out mesenteric ischemia  Plan:   CT angiogram of abdomen and pelvis    Cassell Clement 03/24/2018, 9:21 AM  Pager: [phone] If no answer or after 5 PM call [phone]

## 2018-03-24 NOTE — Progress Notes (Signed)
PROGRESS NOTE    Ryan [name]  OBS:[id number] DOB: Nov 10, 1945 DOA: 03/22/2018 PCP: [name], MD  Brief Narrative:72 y.o.malewith medical history significant ofCOPD, atrial fibrillation, rheumatic fever, admitted with cough and shortness of breath. He reports a cough and shortness of breath is increased at night. Denies any fever. Denies any chest pain. Patient had one episode of nausea and vomiting after coming to the floor. He reports a weight gain of 18 pounds in the last 1 year. They attribute this to changing 1 of his medications by his cardiologist. He is able to walk in the park but he has not done that in few days back due to right lower extremity edema. He complains of right lower extremity edema more than the left lower extremity edema. And that is new since his medication change made by cardiology. He also reports that he had a blockage in his colon which was a polyp and polyp was removed as a part of the colon was removed .Dr. Watt Climes is his GI doctor and Dr. Lovena Le is his cardiologist. Denies any diarrhea or constipation. His last echocardiogram was 2 years ago his ejection fraction was normal at that time. ED Course:He received Rocephin in the ER. Ultrasound of the right lower extremity was done due to positive d-dimer with showed no acute DVT. Sodium 141 potassium 3.2 BUN 11 creatinine 0.89, his BNP was 417 troponin was less than 0.03, count 9.7 hemoglobin 12.2 platelet count 214. D-dimer was 0.73 last TSH from March 2019 was 1.89. Chest x-ray showed scarring at the right lung base suspect early pneumonia superimposed in this area with small right pleural effusion. Follow-up chest x-ray recommended in 3 to 4 weeks after trial of antibiotics to exclude malignancy.       Assessment & Plan:   Active Problems:   CHF (congestive heart failure) (HCC)   Lobar pneumonia (HCC)   A-fib ([place])   Edema   Community acquired pneumonia   1]new onset  CHF/chronic A. fib-patient admitted with increasing lower extremity edema shortness of breath and weight gain. BNP is elevated over 400.  Appreciate cardiology input.  Lasix dose increased to 40 mg twice a day.  Echocardiogram done today with normal ejection fraction.  Continue Eliquis, Lopressor, Rythmol.  Plan for outpatient cardioversion noted.  Ultrasound of the lower extremity no evidence of DVT.weight down.  2] epigastric pain-post prandial.work up in progress by gi.ct angio abd/pelvis ordered to ro mesenteric ischemia.  3]chronic atrial fibrillation restart Eliquis continue Lopressor and Rythmol  4]history of BPH restart Flomax.  5]history of glaucoma restart eyedrops.  6] question of infiltrates by chest x-ray with cough with no evidence of leukocytosis we will treat him with a short course of antibiotics change to Augmentin for another 4 days and DC.       DVT prophylaxis: Eliquis Code Status full code Family Communication: Discussed with wife Disposition Plan: TBD Consultants: GI, cardiology   Procedures: None Antimicrobials: Rocephin Subjective: Overall feeling better decreased swelling in both legs.   Objective: Vitals:   03/23/18 1432 03/23/18 2017 03/24/18 0434 03/24/18 0755  BP: 128/76 106/68 118/77 (!) 133/93  Pulse: 72 95 90 (!) 102  Resp: 20 18 18    Temp: 98.1 F (36.7 C) 98.2 F (36.8 C) 98.5 F (36.9 C)   TempSrc: Oral Oral Oral   SpO2: 95% 97% 94% 96%  Weight:      Height:        Intake/Output Summary (Last 24 hours) at 03/24/2018 1310  Last data filed at 03/24/2018 1158 Gross per 24 hour  Intake 360 ml  Output 1750 ml  Net -1390 ml   Filed Weights   03/22/18 1520  Weight: 80.9 kg (178 lb 5.6 oz)    Examination:  General exam: Appears calm and comfortable  Respiratory system: Clear to auscultation. Respiratory effort normal. Cardiovascular system: S1 & S2 heard, RRR. No JVD, murmurs, rubs, gallops or clicks. No pedal  edema. Gastrointestinal system: Abdomen is nondistended, soft and nontender. No organomegaly or masses felt. Normal bowel sounds heard. Central nervous system: Alert and oriented. No focal neurological deficits. Extremities:decreased edema. Skin: No rashes, lesions or ulcers Psychiatry: Judgement and insight appear normal. Mood & affect appropriate.     Data Reviewed: I have personally reviewed following labs and imaging studies  CBC: Recent Labs  Lab 03/22/18 0855  WBC 9.7  NEUTROABS 7.0  HGB 12.2*  HCT 35.5*  MCV 91.7  PLT 824   Basic Metabolic Panel: Recent Labs  Lab 03/22/18 0855 03/24/18 0500  NA 141 141  K 3.2* 2.8*  CL 105 99*  CO2 27 31  GLUCOSE 95 95  BUN 11 11  CREATININE 0.89 1.11  CALCIUM 7.7* 7.3*   GFR: Estimated Creatinine Clearance: 59.8 mL/min (by C-G formula based on SCr of 1.11 mg/dL). Liver Function Tests: No results for input(s): AST, ALT, ALKPHOS, BILITOT, PROT, ALBUMIN in the last 168 hours. No results for input(s): LIPASE, AMYLASE in the last 168 hours. No results for input(s): AMMONIA in the last 168 hours. Coagulation Profile: No results for input(s): INR, PROTIME in the last 168 hours. Cardiac Enzymes: Recent Labs  Lab 03/22/18 0855 03/22/18 1859 03/22/18 2338 03/23/18 0527  TROPONINI <0.03 <0.03 <0.03 <0.03   BNP (last 3 results) No results for input(s): PROBNP in the last 8760 hours. HbA1C: No results for input(s): HGBA1C in the last 72 hours. CBG: No results for input(s): GLUCAP in the last 168 hours. Lipid Profile: No results for input(s): CHOL, HDL, LDLCALC, TRIG, CHOLHDL, LDLDIRECT in the last 72 hours. Thyroid Function Tests: No results for input(s): TSH, T4TOTAL, FREET4, T3FREE, THYROIDAB in the last 72 hours. Anemia Panel: No results for input(s): VITAMINB12, FOLATE, FERRITIN, TIBC, IRON, RETICCTPCT in the last 72 hours. Sepsis Labs: No results for input(s): PROCALCITON, LATICACIDVEN in the last 168 hours.  No  results found for this or any previous visit (from the past 240 hour(s)).       Radiology Studies: Ct Abdomen Pelvis W Contrast  Result Date: 03/22/2018 CLINICAL DATA:  Cough and shortness of breath worse at night. One episode of nausea and vomiting. EXAM: CT ABDOMEN AND PELVIS WITH CONTRAST TECHNIQUE: Multidetector CT imaging of the abdomen and pelvis was performed using the standard protocol following bolus administration of intravenous contrast. CONTRAST:  57mL OMNIPAQUE IOHEXOL 300 MG/ML SOLN, 158mL ISOVUE-300 IOPAMIDOL (ISOVUE-300) INJECTION 61% COMPARISON:  10/11/2017 FINDINGS: Lower chest: Mild emphysematous disease over the right base. Small bilateral pleural effusions with associated atelectasis right worse than left. Mild cardiomegaly. Calcified plaque over the right coronary artery. Hepatobiliary: Previous cholecystectomy. Subcentimeter hypodensity over the dome of the liver too small to characterize but likely a cyst. Biliary tree is normal. Pancreas: Normal. Spleen: Normal. Adrenals/Urinary Tract: Adrenal glands are normal. Kidneys are normal in size without hydronephrosis or nephrolithiasis. Subcentimeter hypodensity over the mid pole cortex right kidney too small to characterize but likely a cyst. Ureters and bladder are normal. Stomach/Bowel: There is moderate gastric distension. Small bowel is within normal. Previous appendectomy.  Diverticulosis of the colon most prominent over the sigmoid colon. Vascular/Lymphatic: Calcified plaque over the abdominal aorta and iliac arteries. No adenopathy. Reproductive: Normal. Other: No free fluid or focal inflammatory change. Musculoskeletal: Mild degenerate change of the spine. IMPRESSION: Small bilateral pleural effusions right worse than left with associated bibasilar atelectasis. No acute findings in the abdomen/pelvis. Mild cardiomegaly with atherosclerotic coronary artery disease. Colonic diverticulosis. Subcentimeter right renal cortical  hypodensity too small to characterize but likely a cyst. Aortic Atherosclerosis (ICD10-I70.0). Electronically Signed   By: Marin Olp M.D.   On: 03/22/2018 22:02        Scheduled Meds: . amoxicillin-clavulanate  1 tablet Oral BID WC  . apixaban  5 mg Oral BID  . brimonidine  1 drop Both Eyes BID  . cholecalciferol  5,000 Units Oral Q M,W,F  . furosemide  40 mg Intravenous BID  . iopamidol      . latanoprost  1 drop Left Eye QHS  . metoprolol tartrate  37.5 mg Oral BID  . multivitamin  1 tablet Oral Daily  . omeprazole  20 mg Oral Daily  . potassium chloride  20 mEq Oral BID  . propafenone  225 mg Oral BID  . tamsulosin  0.4 mg Oral Daily  . traZODone  50 mg Oral QHS   Continuous Infusions: . potassium chloride 10 mEq (03/24/18 1240)     LOS: 2 days     [name], MD [facility]  If 7PM-7AM, please contact night-coverage [email or web address] Password [email or web address] 03/24/2018, 1:10 PM

## 2018-03-24 NOTE — Progress Notes (Addendum)
Progress Note  Patient Name: Ryan [name] Date of Encounter: 03/24/2018  Primary Cardiologist: Dr. Cristopher Peru, MD   Subjective   Pt feeling better today. HR now in the 80-90's. Denies chest pain   Inpatient Medications    Scheduled Meds: . amoxicillin-clavulanate  1 tablet Oral BID WC  . apixaban  5 mg Oral BID  . brimonidine  1 drop Both Eyes BID  . cholecalciferol  5,000 Units Oral Q M,W,F  . furosemide  40 mg Intravenous BID  . latanoprost  1 drop Left Eye QHS  . metoprolol tartrate  37.5 mg Oral BID  . multivitamin  1 tablet Oral Daily  . omeprazole  20 mg Oral Daily  . potassium chloride  10 mEq Oral Daily  . propafenone  225 mg Oral BID  . tamsulosin  0.4 mg Oral Daily  . traZODone  50 mg Oral QHS   Continuous Infusions:  PRN Meds: HYDROmorphone (DILAUDID) injection, ondansetron (ZOFRAN) IV   Vital Signs    Vitals:   03/23/18 1432 03/23/18 2017 03/24/18 0434 03/24/18 0755  BP: 128/76 106/68 118/77 (!) 133/93  Pulse: 72 95 90 (!) 102  Resp: 20 18 18    Temp: 98.1 F (36.7 C) 98.2 F (36.8 C) 98.5 F (36.9 C)   TempSrc: Oral Oral Oral   SpO2: 95% 97% 94% 96%  Weight:      Height:        Intake/Output Summary (Last 24 hours) at 03/24/2018 0856 Last data filed at 03/24/2018 0825 Gross per 24 hour  Intake 790 ml  Output 2700 ml  Net -1910 ml   Filed Weights   03/22/18 1520  Weight: 178 lb 5.6 oz (80.9 kg)    Physical Exam   General: Well developed, well nourished, NAD Skin: Warm, dry, intact  Head: Normocephalic, atraumatic, clear, moist mucus membranes. Neck: Negative for carotid bruits. No JVD Lungs:Clear to ausculation bilaterally. No wheezes, rales, or rhonchi. Breathing is unlabored. Cardiovascular: Irregularly irregular with S1 S2. No murmurs, rubs or gallops Abdomen: Soft, non-tender, non-distended with normoactive bowel sounds. No obvious abdominal masses. MSK: Strength and tone appear normal for age. 5/5 in all  extremities Extremities: BLE 1+ edema. No clubbing or cyanosis. DP/PT pulses 2+ bilaterally Neuro: Alert and oriented. No focal deficits. No facial asymmetry. MAE spontaneously. Psych: Responds to questions appropriately with normal affect.    Labs    Chemistry Recent Labs  Lab 03/22/18 0855 03/24/18 0500  NA 141 141  K 3.2* 2.8*  CL 105 99*  CO2 27 31  GLUCOSE 95 95  BUN 11 11  CREATININE 0.89 1.11  CALCIUM 7.7* 7.3*  GFRNONAA >60 >60  GFRAA >60 >60  ANIONGAP 9 11    Hematology Recent Labs  Lab 03/22/18 0855  WBC 9.7  RBC 3.87*  HGB 12.2*  HCT 35.5*  MCV 91.7  MCH 31.5  MCHC 34.4  RDW 13.4  PLT 214   Cardiac Enzymes Recent Labs  Lab 03/22/18 0855 03/22/18 1859 03/22/18 2338 03/23/18 0527  TROPONINI <0.03 <0.03 <0.03 <0.03   No results for input(s): TROPIPOC in the last 168 hours.   BNP Recent Labs  Lab 03/22/18 0855  BNP 417.0*    DDimer  Recent Labs  Lab 03/22/18 0855  DDIMER 0.73*    Radiology    Dg Chest 2 View  Result Date: 03/22/2018 CLINICAL DATA:  Shortness of breath EXAM: CHEST - 2 VIEW COMPARISON:  March 27, 2017 FINDINGS: There is scarring  in the right base. There is a small right pleural effusion with subtle increased opacity in this area, suspicious for pneumonia. Lungs elsewhere clear. Heart size and pulmonary vascularity normal. No adenopathy. No bone lesions. There is postoperative change in the lower cervical region. IMPRESSION: Scarring right base. Suspect early pneumonia superimposed in this area with small right pleural effusion. Lungs elsewhere clear. Stable cardiac silhouette. Followup PA and lateral chest radiographs recommended in 3-4 weeks following trial of antibiotic therapy to ensure resolution and exclude underlying malignancy. Electronically Signed   By: Lowella Grip III M.D.   On: 03/22/2018 10:30   Ct Abdomen Pelvis W Contrast  Result Date: 03/22/2018 CLINICAL DATA:  Cough and shortness of breath worse at night.  One episode of nausea and vomiting. EXAM: CT ABDOMEN AND PELVIS WITH CONTRAST TECHNIQUE: Multidetector CT imaging of the abdomen and pelvis was performed using the standard protocol following bolus administration of intravenous contrast. CONTRAST:  82mL OMNIPAQUE IOHEXOL 300 MG/ML SOLN, 188mL ISOVUE-300 IOPAMIDOL (ISOVUE-300) INJECTION 61% COMPARISON:  10/11/2017 FINDINGS: Lower chest: Mild emphysematous disease over the right base. Small bilateral pleural effusions with associated atelectasis right worse than left. Mild cardiomegaly. Calcified plaque over the right coronary artery. Hepatobiliary: Previous cholecystectomy. Subcentimeter hypodensity over the dome of the liver too small to characterize but likely a cyst. Biliary tree is normal. Pancreas: Normal. Spleen: Normal. Adrenals/Urinary Tract: Adrenal glands are normal. Kidneys are normal in size without hydronephrosis or nephrolithiasis. Subcentimeter hypodensity over the mid pole cortex right kidney too small to characterize but likely a cyst. Ureters and bladder are normal. Stomach/Bowel: There is moderate gastric distension. Small bowel is within normal. Previous appendectomy. Diverticulosis of the colon most prominent over the sigmoid colon. Vascular/Lymphatic: Calcified plaque over the abdominal aorta and iliac arteries. No adenopathy. Reproductive: Normal. Other: No free fluid or focal inflammatory change. Musculoskeletal: Mild degenerate change of the spine. IMPRESSION: Small bilateral pleural effusions right worse than left with associated bibasilar atelectasis. No acute findings in the abdomen/pelvis. Mild cardiomegaly with atherosclerotic coronary artery disease. Colonic diverticulosis. Subcentimeter right renal cortical hypodensity too small to characterize but likely a cyst. Aortic Atherosclerosis (ICD10-I70.0). Electronically Signed   By: Marin Olp M.D.   On: 03/22/2018 22:02   Telemetry    03/24/18 Atrial fibrillation HR 80-90's -  Personally Reviewed  ECG    No new tracings as of 03/24/2017- Personally Reviewed  Cardiac Studies   Echocardiogram 03/23/2018: Study Conclusions  - Left ventricle: The cavity size was normal. Wall thickness was   increased in a pattern of moderate LVH. Systolic function was   normal. The estimated ejection fraction was in the range of 55%   to 60%. Wall motion was normal; there were no regional wall   motion abnormalities. - Left atrium: The atrium was severely dilated. - Right atrium: The atrium was moderately to severely dilated.  Patient Profile     72 y.o. male who is being followed by Cardiology for the evaluation and management of CHF at the request of Dr [name].  Assessment & Plan    1.  Acute on chronic diastolic heart failure: -Echocardiogram completed 03/23/2018 with normal LVEF at 55 to 60% and no wall motion abnormalities, left and right atrium severely dilated.  Unable to accurately assess diastolic dysfunction secondary to atrial fibrillation -Weight, 178lb -I&O, net -2 L, 24-hour urine output 2.7 L -IV Lasix 40 mg twice daily -Creatinine, 1.11 today, up from 0.89 yesterday.  Baseline appears to be in the 0.8-1.0 range -  Continue daily weights, strict I&O -Low-sodium diet -Acute CHF exacerbation likely secondary to AFib RVR with plans as stated below  2.  Atrial fibrillation with RVR: -Atrial fibrillation, heart rate 90s to low 100s -Continue Eliquis 5 -Continue propafenone 225mg   -She reports missing one Eliquis dose dose 03/22/2018 upon admission otherwise compliant with medications, therefore will adequately diuresis and plan for outpatient cardioversion in approximately 3 to 4 weeks -CHA2DS2VASc =3  3.  HTN: -Stable, 133/93, 118/77, 106/68 -Continue metoprolol 37.5  4.  Hypokalemia: -K+, 2.8 today.  Replaced with 40 mEq K-Dur this AM with 25mEq twice daily supplementation -Will need recheck tomorrow AM   Signed, Kathyrn Drown  NP-C [facility] Pager: [phone] 03/24/2018, 8:56 AM     For questions or updates, please contact   Please consult [email or web address] for contact info under Cardiology/STEMI.  The patient was seen, examined and discussed with Kathyrn Drown, NP  and I agree with the above.   Patient diuresed 2 L overnight, with some improvement of symptoms, creatinine only slightly up, I would continue the same dose of IV Lasix 40 twice daily.  We will follow.  She continues to be in A. fib but with improved rate control.  Weights appear inaccurate.  Replace potassium.  Echocardiogram showed preserved LVEF at 55 to 60%.  Ena Dawley, MD 03/24/2018

## 2018-03-25 ENCOUNTER — Telehealth: Payer: Self-pay | Admitting: Physician Assistant

## 2018-03-25 DIAGNOSIS — I482 Chronic atrial fibrillation: Secondary | ICD-10-CM

## 2018-03-25 LAB — BASIC METABOLIC PANEL
Anion gap: 14 (ref 5–15)
BUN: 13 mg/dL (ref 6–20)
CO2: 27 mmol/L (ref 22–32)
Calcium: 7.3 mg/dL — ABNORMAL LOW (ref 8.9–10.3)
Chloride: 99 mmol/L — ABNORMAL LOW (ref 101–111)
Creatinine, Ser: 1.08 mg/dL (ref 0.61–1.24)
GFR calc Af Amer: >60 mL/min (ref >60)
GFR calc non Af Amer: >60 mL/min (ref >60)
Glucose, Bld: 100 mg/dL — ABNORMAL HIGH (ref 65–99)
Potassium: 3.4 mmol/L — ABNORMAL LOW (ref 3.5–5.1)
Sodium: 140 mmol/L (ref 135–145)

## 2018-03-25 MED ORDER — METOPROLOL TARTRATE 37.5 MG PO TABS: 37.5000 mg | ORAL_TABLET | Freq: Two times a day (BID) | ORAL | 0 refills | Status: DC

## 2018-03-25 MED ORDER — POTASSIUM CHLORIDE ER 10 MEQ PO TBCR: 20.0000 meq | EXTENDED_RELEASE_TABLET | Freq: Every day | ORAL | 0 refills | Status: DC

## 2018-03-25 MED ORDER — AMOXICILLIN-POT CLAVULANATE 875-125 MG PO TABS: 1.0000 | ORAL_TABLET | Freq: Two times a day (BID) | ORAL | 0 refills | Status: DC

## 2018-03-25 MED ORDER — FUROSEMIDE 40 MG PO TABS: 40.0000 mg | ORAL_TABLET | Freq: Every day | ORAL | 0 refills | Status: DC

## 2018-03-25 MED ORDER — FUROSEMIDE 40 MG PO TABS
40.0000 mg | ORAL_TABLET | Freq: Every day | ORAL | Status: DC
  Administered 2018-03-25: 40 mg via ORAL
  Filled 2018-03-25: qty 1

## 2018-03-25 NOTE — Progress Notes (Signed)
CT scan does not show a vascular cause for upper abdominal pain. He has been dealing with this for quite some time and has been seeing his gastroenterologist at [facility] in regards to this. At this point nothing further to add to his already extensive evaluation. Will sign off. He can follow up with Dr Earlean Shawl at [facility].

## 2018-03-25 NOTE — Telephone Encounter (Signed)
TCM- Pt currently admitted.

## 2018-03-25 NOTE — Progress Notes (Signed)
Pt discharged from the unit via wheelchair. Discharge instructions were reviewed with pt and wife in the room. RN provided the pt with education and provided with CHF information packet prior to discharge. No questions or concerns at this time.

## 2018-03-25 NOTE — Care Management Important Message (Signed)
Important Message  Patient Details  Name: Ryan [name] MRN: [id number] Date of Birth: December 30, 1945   Medicare Important Message Given:  Yes    Kerin Salen 03/25/2018, 11:39 AMImportant Message  Patient Details  Name: Ryan [name] MRN: [id number] Date of Birth: [birth year]   Medicare Important Message Given:  Yes    Kerin Salen 03/25/2018, 11:39 AM

## 2018-03-25 NOTE — Progress Notes (Signed)
Progress Note  Patient Name: Ryan [name] Date of Encounter: 03/25/2018  Primary Cardiologist: Dr. Cristopher Peru, MD   Subjective   Breathing and lower extremity edema is better but still gets pain while eating, scared to eat.   Inpatient Medications    Scheduled Meds: . amoxicillin-clavulanate  1 tablet Oral BID WC  . apixaban  5 mg Oral BID  . brimonidine  1 drop Both Eyes BID  . cholecalciferol  5,000 Units Oral Q M,W,F  . furosemide  40 mg Intravenous BID  . latanoprost  1 drop Left Eye QHS  . metoprolol tartrate  37.5 mg Oral BID  . multivitamin  1 tablet Oral Daily  . omeprazole  20 mg Oral Daily  . potassium chloride  20 mEq Oral BID  . propafenone  225 mg Oral BID  . tamsulosin  0.4 mg Oral Daily  . traZODone  50 mg Oral QHS   Continuous Infusions:  PRN Meds: HYDROmorphone (DILAUDID) injection, ondansetron (ZOFRAN) IV   Vital Signs    Vitals:   03/24/18 0755 03/24/18 1413 03/24/18 2103 03/25/18 0507  BP: (!) 133/93 128/74 115/71 (!) 115/95  Pulse: (!) 102 96 81 (!) 109  Resp:  18 18 18   Temp:  98.2 F (36.8 C) 98.6 F (37 C) 98.1 F (36.7 C)  TempSrc:  Oral Oral Oral  SpO2: 96% 90% 95% 97%  Weight:      Height:        Intake/Output Summary (Last 24 hours) at 03/25/2018 0616 Last data filed at 03/24/2018 1800 Gross per 24 hour  Intake 520 ml  Output 1350 ml  Net -830 ml   Filed Weights   03/22/18 1520  Weight: 178 lb 5.6 oz (80.9 kg)    Physical Exam   General: Well developed, well nourished, NAD Skin: Warm, dry, intact  Head: Normocephalic, atraumatic, clear, moist mucus membranes. Neck: Negative for carotid bruits. No JVD Lungs:Clear to ausculation bilaterally. No wheezes, rales, or rhonchi. Breathing is unlabored. Cardiovascular: Irregularly irregular with S1 S2. No murmurs, rubs or gallops Abdomen: Soft, non-tender, non-distended with normoactive bowel sounds. No obvious abdominal masses. MSK: Strength and tone appear normal  for age. 5/5 in all extremities Extremities: BLE  Trace edema. No clubbing or cyanosis. DP/PT pulses 2+ bilaterally Neuro: Alert and oriented. No focal deficits. No facial asymmetry. MAE spontaneously. Psych: Responds to questions appropriately with normal affect.    Labs    Chemistry Recent Labs  Lab 03/22/18 0855 03/24/18 0500  NA 141 141  K 3.2* 2.8*  CL 105 99*  CO2 27 31  GLUCOSE 95 95  BUN 11 11  CREATININE 0.89 1.11  CALCIUM 7.7* 7.3*  GFRNONAA >60 >60  GFRAA >60 >60  ANIONGAP 9 11    Hematology Recent Labs  Lab 03/22/18 0855  WBC 9.7  RBC 3.87*  HGB 12.2*  HCT 35.5*  MCV 91.7  MCH 31.5  MCHC 34.4  RDW 13.4  PLT 214   Cardiac Enzymes Recent Labs  Lab 03/22/18 0855 03/22/18 1859 03/22/18 2338 03/23/18 0527  TROPONINI <0.03 <0.03 <0.03 <0.03   No results for input(s): TROPIPOC in the last 168 hours.   BNP Recent Labs  Lab 03/22/18 0855  BNP 417.0*    DDimer  Recent Labs  Lab 03/22/18 0855  DDIMER 0.73*    Radiology    Ct Angio Abd/pel W/ And/or W/o  Result Date: 03/24/2018 CLINICAL DATA:  upper abdominal pain and generalized abdominal pain postprandially.  s. IMPRESSION: VASCULAR 1. No significant proximal visceral arterial occlusive disease to suggest any etiology of occlusive mesenteric ischemia. 2. 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 Correlate with clinical symptomatology and ABIs. 3.  Aortic Atherosclerosis (ICD10-170.0) NON-VASCULAR 1. Persistent small right pleural effusion. 2. No acute findings. 3. Sigmoid diverticulosis. Electronically Signed   By: Lucrezia Europe M.D.   On: 03/24/2018 16:47   Telemetry    03/24/18 Atrial fibrillation HR 80-90's - Personally Reviewed  ECG    No new tracings as of 03/24/2017- Personally Reviewed  Cardiac Studies   Echocardiogram 03/23/2018: Study Conclusions  - Left ventricle: The cavity size was normal. Wall thickness was   increased in a pattern of moderate LVH. Systolic  function was   normal. The estimated ejection fraction was in the range of 55%   to 60%. Wall motion was normal; there were no regional wall   motion abnormalities. - Left atrium: The atrium was severely dilated. - Right atrium: The atrium was moderately to severely dilated.  Patient Profile     72 y.o. male who is being followed by Cardiology for the evaluation and management of CHF at the request of Dr [name].  Assessment & Plan    1.  Acute on chronic diastolic heart failure: -Echocardiogram completed 03/23/2018 with normal LVEF at 55 to 60% and no wall motion abnormalities, left and right atrium severely dilated.  Unable to accurately assess diastolic dysfunction secondary to atrial fibrillation -Acute CHF exacerbation likely secondary to AFib RVR with plans as stated below -Echocardiogram showed preserved LVEF at 55 to 60%. -Weight, not checked yet today -I&O, net -2.8 L, 24-hour urine output 0.8 L, significant improvement in LE edema and clear lungs -IV Lasix 40 mg twice daily, I would switch to lasix 40 mg po daily and discharge home from cardiac perspective -Creatinine pending  2.  Atrial fibrillation with RVR: -Atrial fibrillation, heart rate in 80's -Continue Eliquis 5 -Continue propafenone 225mg   -She reports missing one Eliquis dose dose 03/22/2018 upon admission otherwise compliant with medications, therefore will adequately diuresis and plan for outpatient cardioversion in approximately 3 to 4 weeks -[id number] =3  3.  HTN: -Stable, 133/93, 118/77, 106/68 -Continue metoprolol 37.5  4.  Hypokalemia: -K+, 2.8 today.  Replaced with 40 mEq K-Dur this AM with 16mEq twice daily supplementation -Will need recheck tomorrow AM  I would switch to lasix 40 mg po daily and discharge home from cardiac perspective, replace K as needed, labs pending, we will sign off, we will arrange for an outpatient follow up.   Ena Dawley, MD 03/25/2018

## 2018-03-25 NOTE — Telephone Encounter (Signed)
TOC Patient- Please call Patient- Patient has an appointment on 04-13-18 with Richardson Dopp.

## 2018-03-25 NOTE — Discharge Summary (Signed)
Physician Discharge Summary  Ryan [name] OHY:[id number] DOB: 01-06-1946 DOA: 03/22/2018  PCP: [name], MD  Admit date: 03/22/2018 Discharge date: 03/25/2018  Admitted From: home Disposition: home Recommendations for Outpatient Follow-up:  1. Follow up with PCP in 1-2 weeks 2. Please obtain BMP/CBC in one week 3. Follow up with dr taylor cardiology 4. Follow up with dr Earlean Shawl gi  Home Health none Equipment/Devices none Discharge Condition stable CODE STATUS:full Diet recommendation: cardiac  Brief/Interim Summary:72 y.o.malewith medical history significant ofCOPD, atrial fibrillation, rheumatic fever, admitted with cough and shortness of breath. He reports a cough and shortness of breath is increased at night. Denies any fever. Denies any chest pain. Patient had one episode of nausea and vomiting after coming to the floor. He reports a weight gain of 18 pounds in the last 1 year. They attribute this to changing 1 of his medications by his cardiologist. He is able to walk in the park but he has not done that in few days back due to right lower extremity edema. He complains of right lower extremity edema more than the left lower extremity edema. And that is new since his medication change made by cardiology. He also reports that he had a blockage in his colon which was a polyp and polyp was removed as a part of the colon was removed .Dr. Watt Climes is his GI doctor and Dr. Lovena Le is his cardiologist. Denies any diarrhea or constipation. His last echocardiogram was 2 years ago his ejection fraction was normal at that time. ED Course:He received Rocephin in the ER. Ultrasound of the right lower extremity was done due to positive d-dimer with showed no acute DVT. Sodium 141 potassium 3.2 BUN 11 creatinine 0.89, his BNP was 417 troponin was less than 0.03, count 9.7 hemoglobin 12.2 platelet count 214. D-dimer was 0.73 last TSH from March 2019 was 1.89. Chest x-ray showed  scarring at the right lung base suspect early pneumonia superimposed in this area with small right pleural effusion. Follow-up chest x-ray recommended in 3 to 4 weeks after trial of antibiotics to exclude malignancy.     Discharge Diagnoses:  Active Problems:   CHF (congestive heart failure) (HCC)   Lobar pneumonia (HCC)   A-fib ([place])   Edema   Community acquired pneumonia  1]new onset CHF/chronic A. fib-patient admitted with increasing lower extremity edema shortness of breath and weight gain. BNP was elevated over 400. Cardiology was consulted.  Patient was treated with Lasix 40 mg twice a day and then decreased to 40 mg daily with good improvement in his edema. Plan for outpatient cardioversion noted. Ultrasound of the lower extremity no evidence of DVT.we will discharge him on Lasix 40 mg daily with potassium 20 mEq daily.  He will follow-up with his PCP, cardiology.  2]epigastric pain-post prandial.?  Patient had CT angiogram of the abdomen and pelvis with no evidence of arterial occlusive disease to suggest occlusive mesenteric ischemia.  Continue Prilosec.  Patient will follow-up with his GI doctor at [facility].  3]chronic atrial fibrillation  Continue Eliquis, Lopressor and Rythmol  4]history of BPH  Flomax.  5]history of glaucoma continue  eyedrops.  6]CAP-patient was  started on Rocephin and changed to Augmentin upon discharge he will continue Augmentin for 2 more days.    Discharge Instructions  Discharge Instructions    Call MD for:  difficulty breathing, headache or visual disturbances   Complete by:  As directed    Call MD for:  persistant dizziness or light-headedness  Complete by:  As directed    Call MD for:  persistant nausea and vomiting   Complete by:  As directed    Call MD for:  redness, tenderness, or signs of infection (pain, swelling, redness, odor or green/yellow discharge around incision site)   Complete by:  As directed    Call MD  for:  severe uncontrolled pain   Complete by:  As directed    Call MD for:  temperature >100.4   Complete by:  As directed    Diet - low sodium heart healthy   Complete by:  As directed    Increase activity slowly   Complete by:  As directed      Allergies as of 03/25/2018      Reactions   Guaifenesin Diarrhea, Anaphylaxis   Other Nausea And Vomiting, Swelling   Other reaction(s): Other (See Comments) Stomach cramps Eye drops made eyes swell CIPROFLOXIN-no reaction noted GUAIFENESIN PSEUDOEPHEDRIN- no reaction noted   Polymyxin B-trimethoprim Swelling   Eye drops made eyes swell   Codeine Rash, Itching, Hives   Other reaction(s): Other (See Comments) No reaction noted   Guaifenesin & Derivatives    Other reaction(s): Hallucinations Other reaction(s): Hallucinations, Other (See Comments) Other reaction(s): Hallucinations   Polymyxin B Other (See Comments)   Swelling Eye swelling  Other reaction(s): Other (See Comments) Eye swelling  Other reaction(s): Other, Other (See Comments) Swelling Eye swelling  Swelling Swelling   Statins Other (See Comments)   Muscle aches, Simvastatin,Atorvstatin, Other reaction(s): Other (See Comments) Muscle cramps  Muscle aches, Simvastatin,Atorvstatin, Muscle aches, Simvastatin,Atorvstatin, Muscle aches Muscle cramps  Other reaction(s): Other, Other (See Comments) Muscle aches and cramps Muscle aches   Mucinex D [pseudoephedrine-guaifenesin Er] Nausea And Vomiting   Stomach cramps   Pseudoephedrine Hives   Stomach cramps Other reaction(s): Other (See Comments) Stomach cramps   Pseudoephedrine-guaifenesin Nausea And Vomiting   Stomach cramps   Rosuvastatin Calcium Other (See Comments)   Muscle aches   Simvastatin Other (See Comments)   Muscle aches and cramps   Tapentadol Other (See Comments)   unknown Other reaction(s): Other (See Comments) unknown No reaction noted   Ciprofloxacin Hives, Itching, Nausea Only, Rash    Codeine Phosphate Itching, Rash   Moxifloxacin Other (See Comments), Nausea Only   Stomach cramps Other reaction(s): Other (See Comments) headaches Stomach cramps No reaction noted   Rofecoxib Other (See Comments), Nausea Only   Stomach cramping      Medication List    TAKE these medications   amoxicillin-clavulanate 875-125 MG tablet Commonly known as:  AUGMENTIN Take 1 tablet by mouth 2 (two) times daily with a meal.   brimonidine 0.2 % ophthalmic solution Commonly known as:  ALPHAGAN INT 1 GTT IN OS BID   cholecalciferol 1000 units tablet Commonly known as:  VITAMIN D Take 5,000 Units by mouth every Monday, Wednesday, and Friday.   clobetasol 0.05 % external solution Commonly known as:  TEMOVATE APPLY 1 APPLICATION TOPICALLY TWICE DAILY What changed:  Another medication with the same name was removed. Continue taking this medication, and follow the directions you see here.   ELIQUIS 5 MG Tabs tablet Generic drug:  apixaban TAKE 1 TABLET BY MOUTH TWO  TIMES DAILY   fluconazole 100 MG tablet Commonly known as:  DIFLUCAN Take 100 mg by mouth as needed (thrush from back injection of steroid).   furosemide 40 MG tablet Commonly known as:  LASIX Take 1 tablet (40 mg total) by mouth daily. Start taking on:  03/26/2018   ICAPS MV Tabs Take 1 tablet by mouth 2 (two) times daily.   latanoprost 0.005 % ophthalmic solution Commonly known as:  XALATAN Place 1 drop into the left eye at bedtime.   LUCENTIS IO Inject into the eye as directed. (Macular Degeneration)   Metoprolol Tartrate 37.5 MG Tabs Take 37.5 mg by mouth 2 (two) times daily. What changed:    medication strength  See the new instructions.   omeprazole 20 MG tablet Commonly known as:  PRILOSEC OTC Take 20 mg by mouth daily.   potassium chloride 10 MEQ tablet Commonly known as:  K-DUR Take 2 tablets (20 mEq total) by mouth daily.   propafenone 225 MG tablet Commonly known as:  RYTHMOL Take 1  tablet (225 mg total) by mouth 2 (two) times daily.   tamsulosin 0.4 MG Caps capsule Commonly known as:  FLOMAX Take 1 capsule (0.4 mg total) by mouth daily. What changed:    when to take this  reasons to take this      Follow-up Information    Evans Lance, MD Follow up on 04/13/2018.   Specialty:  Cardiology Why:  with Richardson Dopp, PA for Dr. Lovena Le at 8:30 AM Contact information: [address] [address] [phone]        [name], MD Follow up.   Specialty:  Family Medicine Contact information: [place] [address] [phone]        Richmond Campbell, MD Follow up.   Specialty:  Gastroenterology Contact information: [address] [place] [address] [phone]          Allergies  Allergen Reactions  . Guaifenesin Diarrhea and Anaphylaxis  . Other Nausea And Vomiting and Swelling    Other reaction(s): Other (See Comments) Stomach cramps Eye drops made eyes swell CIPROFLOXIN-no reaction noted GUAIFENESIN PSEUDOEPHEDRIN- no reaction noted  . Polymyxin B-Trimethoprim Swelling    Eye drops made eyes swell  . Codeine Rash, Itching and Hives    Other reaction(s): Other (See Comments) No reaction noted  . Guaifenesin & Derivatives     Other reaction(s): Hallucinations Other reaction(s): Hallucinations, Other (See Comments) Other reaction(s): Hallucinations   . Polymyxin B Other (See Comments)    Swelling Eye swelling  Other reaction(s): Other (See Comments) Eye swelling  Other reaction(s): Other, Other (See Comments) Swelling Eye swelling  Swelling Swelling   . Statins Other (See Comments)    Muscle aches, Simvastatin,Atorvstatin,  Other reaction(s): Other (See Comments) Muscle cramps  Muscle aches, Simvastatin,Atorvstatin, Muscle aches, Simvastatin,Atorvstatin, Muscle aches Muscle cramps   Other reaction(s): Other, Other (See Comments) Muscle aches and  cramps Muscle aches   . Mucinex D [Pseudoephedrine-Guaifenesin Er] Nausea And Vomiting    Stomach cramps  . Pseudoephedrine Hives    Stomach cramps Other reaction(s): Other (See Comments) Stomach cramps  . Pseudoephedrine-Guaifenesin Nausea And Vomiting    Stomach cramps  . Rosuvastatin Calcium Other (See Comments)    Muscle aches  . Simvastatin Other (See Comments)    Muscle aches and cramps  . Tapentadol Other (See Comments)    unknown Other reaction(s): Other (See Comments) unknown No reaction noted  . Ciprofloxacin Hives, Itching, Nausea Only and Rash  . Codeine Phosphate Itching and Rash  . Moxifloxacin Other (See Comments) and Nausea Only    Stomach cramps Other reaction(s): Other (See Comments) headaches Stomach cramps No reaction noted  . Rofecoxib Other (See Comments) and Nausea Only  Stomach cramping     Consultations:  [facility], [facility]   Procedures/Studies: Dg Chest 2 View  Result Date: 03/22/2018 CLINICAL DATA:  Shortness of breath EXAM: CHEST - 2 VIEW COMPARISON:  March 27, 2017 FINDINGS: There is scarring in the right base. There is a small right pleural effusion with subtle increased opacity in this area, suspicious for pneumonia. Lungs elsewhere clear. Heart size and pulmonary vascularity normal. No adenopathy. No bone lesions. There is postoperative change in the lower cervical region. IMPRESSION: Scarring right base. Suspect early pneumonia superimposed in this area with small right pleural effusion. Lungs elsewhere clear. Stable cardiac silhouette. Followup PA and lateral chest radiographs recommended in 3-4 weeks following trial of antibiotic therapy to ensure resolution and exclude underlying malignancy. Electronically Signed   By: Lowella Grip III M.D.   On: 03/22/2018 10:30   Ct Abdomen Pelvis W Contrast  Result Date: 03/22/2018 CLINICAL DATA:  Cough and shortness of breath worse at night. One episode of nausea and vomiting. EXAM: CT ABDOMEN  AND PELVIS WITH CONTRAST TECHNIQUE: Multidetector CT imaging of the abdomen and pelvis was performed using the standard protocol following bolus administration of intravenous contrast. CONTRAST:  35mL OMNIPAQUE IOHEXOL 300 MG/ML SOLN, 16mL ISOVUE-300 IOPAMIDOL (ISOVUE-300) INJECTION 61% COMPARISON:  10/11/2017 FINDINGS: Lower chest: Mild emphysematous disease over the right base. Small bilateral pleural effusions with associated atelectasis right worse than left. Mild cardiomegaly. Calcified plaque over the right coronary artery. Hepatobiliary: Previous cholecystectomy. Subcentimeter hypodensity over the dome of the liver too small to characterize but likely a cyst. Biliary tree is normal. Pancreas: Normal. Spleen: Normal. Adrenals/Urinary Tract: Adrenal glands are normal. Kidneys are normal in size without hydronephrosis or nephrolithiasis. Subcentimeter hypodensity over the mid pole cortex right kidney too small to characterize but likely a cyst. Ureters and bladder are normal. Stomach/Bowel: There is moderate gastric distension. Small bowel is within normal. Previous appendectomy. Diverticulosis of the colon most prominent over the sigmoid colon. Vascular/Lymphatic: Calcified plaque over the abdominal aorta and iliac arteries. No adenopathy. Reproductive: Normal. Other: No free fluid or focal inflammatory change. Musculoskeletal: Mild degenerate change of the spine. IMPRESSION: Small bilateral pleural effusions right worse than left with associated bibasilar atelectasis. No acute findings in the abdomen/pelvis. Mild cardiomegaly with atherosclerotic coronary artery disease. Colonic diverticulosis. Subcentimeter right renal cortical hypodensity too small to characterize but likely a cyst. Aortic Atherosclerosis (ICD10-I70.0). Electronically Signed   By: Marin Olp M.D.   On: 03/22/2018 22:02   Ct Angio Abd/pel W/ And/or W/o  Result Date: 03/24/2018 CLINICAL DATA:  upper abdominal pain and generalized  abdominal pain postprandially. Actually it starts in the middle of his meal. He has had an EGD 3 months ago which was unrevealing for source of this pain. He had a colonoscopy 3 months ago with findings of a colon polyp that was removed. His GI physician at [facility] has been unable to explain the pain. Dr. Therisa Doyne saw the patient yesterday and raised the suspicion of mesenteric ischemia. EXAM: CTA ABDOMEN AND PELVIS WITH CONTRAST TECHNIQUE: Multidetector CT imaging of the abdomen and pelvis was performed using the standard protocol during bolus administration of intravenous contrast. Multiplanar reconstructed images and MIPs were obtained and reviewed to evaluate the vascular anatomy. CONTRAST:  151mL ISOVUE-370 IOPAMIDOL (ISOVUE-370) INJECTION 76% COMPARISON:  03/22/2018 FINDINGS: VASCULAR Coronary calcifications. Aorta: Moderate partially calcified plaque. No aneurysm, dissection, or stenosis. Celiac: Patent without evidence of aneurysm, dissection, vasculitis or significant stenosis. SMA: Patent without evidence of aneurysm, dissection,  vasculitis or significant stenosis. Renals: On the left, there are 3, all appear patent. Single right renal artery, widely patent. IMA: Patent without evidence of aneurysm, dissection, vasculitis or significant stenosis. Inflow: Right common iliac artery origin stenosis of possible hemodynamic significance, with eccentric partially calcified plaque. Eccentric partially calcified plaque through the left iliac arterial system without high-grade stenosis, aneurysm, or dissection. Mild tortuosity. Proximal Outflow: Bilateral common femoral and visualized portions of the superficial and profunda femoral arteries are patent without evidence of aneurysm, dissection, vasculitis or significant stenosis. Veins: Patent portal vein, SMV, splenic vein, bilateral renal veins. Review of the MIP images confirms the above findings. NON-VASCULAR Lower chest: Small right pleural effusion. Dependent  atelectasis/consolidation posteriorly in the right lower lobe. Emphysematous change in the right lung base. Hepatobiliary: No focal liver abnormality is seen. Status post cholecystectomy. No biliary dilatation. Pancreas: Unremarkable. No pancreatic ductal dilatation or surrounding inflammatory changes. Spleen: Normal in size without focal abnormality. Adrenals/Urinary Tract: Adrenal glands are unremarkable. Kidneys are normal, without renal calculi, focal lesion, or hydronephrosis. Bladder is unremarkable. Stomach/Bowel: Moderate gastric distention. Small bowel nondilated. Appendix surgically absent. Multiple sigmoid diverticula without significant adjacent inflammatory/edematous change. Lymphatic: No abdominal or pelvic adenopathy. Reproductive: Mild prostatic enlargement with central coarse calcification. Other: No ascites.  No free air. Musculoskeletal: No acute or significant osseous findings. IMPRESSION: VASCULAR 1. No significant proximal visceral arterial occlusive disease to suggest any etiology of occlusive mesenteric ischemia. 2. 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 Correlate with clinical symptomatology and ABIs. 3.  Aortic Atherosclerosis (ICD10-170.0) NON-VASCULAR 1. Persistent small right pleural effusion. 2. No acute findings. 3. Sigmoid diverticulosis. Electronically Signed   By: Lucrezia Europe M.D.   On: 03/24/2018 16:47    (Echo, Carotid, EGD, Colonoscopy, ERCP)    Subjective: Feels well breathing better however continues to have epigastric pain takes Prilosec he feels he can eat better after taking Prilosec.   Discharge Exam: Vitals:   03/24/18 2103 03/25/18 0507  BP: 115/71 (!) 115/95  Pulse: 81 (!) 109  Resp: 18 18  Temp: 98.6 F (37 C) 98.1 F (36.7 C)  SpO2: 95% 97%   Vitals:   03/24/18 0755 03/24/18 1413 03/24/18 2103 03/25/18 0507  BP: (!) 133/93 128/74 115/71 (!) 115/95  Pulse: (!) 102 96 81 (!) 109  Resp:  18 18 18   Temp:  98.2 F (36.8 C) 98.6 F  (37 C) 98.1 F (36.7 C)  TempSrc:  Oral Oral Oral  SpO2: 96% 90% 95% 97%  Weight:      Height:        General: Pt is alert, awake, not in acute distress Cardiovascular: RRR, S1/S2 +, no rubs, no gallops Respiratory: CTA bilaterally, no wheezing, no rhonchi Abdominal: Soft, NT, ND, bowel sounds + Extremities:TRACE EDEMA   The results of significant diagnostics from this hospitalization (including imaging, microbiology, ancillary and laboratory) are listed below for reference.     Microbiology: No results found for this or any previous visit (from the past 240 hour(s)).   Labs: BNP (last 3 results) Recent Labs    03/22/18 0855  BNP 244.6*   Basic Metabolic Panel: Recent Labs  Lab 03/22/18 0855 03/24/18 0500 03/25/18 0449  NA 141 141 140  K 3.2* 2.8* 3.4*  CL 105 99* 99*  CO2 27 31 27   GLUCOSE 95 95 100*  BUN 11 11 13   CREATININE 0.89 1.11 1.08  CALCIUM 7.7* 7.3* 7.3*   Liver Function Tests: No results for input(s): AST,  ALT, ALKPHOS, BILITOT, PROT, ALBUMIN in the last 168 hours. No results for input(s): LIPASE, AMYLASE in the last 168 hours. No results for input(s): AMMONIA in the last 168 hours. CBC: Recent Labs  Lab 03/22/18 0855  WBC 9.7  NEUTROABS 7.0  HGB 12.2*  HCT 35.5*  MCV 91.7  PLT 214   Cardiac Enzymes: Recent Labs  Lab 03/22/18 0855 03/22/18 1859 03/22/18 2338 03/23/18 0527  TROPONINI <0.03 <0.03 <0.03 <0.03   BNP: Invalid input(s): POCBNP CBG: No results for input(s): GLUCAP in the last 168 hours. D-Dimer No results for input(s): DDIMER in the last 72 hours. Hgb A1c No results for input(s): HGBA1C in the last 72 hours. Lipid Profile No results for input(s): CHOL, HDL, LDLCALC, TRIG, CHOLHDL, LDLDIRECT in the last 72 hours. Thyroid function studies No results for input(s): TSH, T4TOTAL, T3FREE, THYROIDAB in the last 72 hours.  Invalid input(s): FREET3 Anemia work up No results for input(s): VITAMINB12, FOLATE, FERRITIN,  TIBC, IRON, RETICCTPCT in the last 72 hours. Urinalysis    Component Value Date/Time   COLORURINE YELLOW 12/27/2017 [place] 12/27/2017 1157   LABSPEC 1.020 12/27/2017 1157   PHURINE 6.0 12/27/2017 1157   GLUCOSEU NEGATIVE 12/27/2017 1157   HGBUR NEGATIVE 12/27/2017 1157   HGBUR negative 03/15/2009 0804   BILIRUBINUR NEGATIVE 12/27/2017 1157   BILIRUBINUR n 12/01/2013 1136   [place] 12/27/2017 1157   PROTEINUR NEGATIVE 08/07/2017 1412   UROBILINOGEN 0.2 12/27/2017 1157   NITRITE NEGATIVE 12/27/2017 1157   LEUKOCYTESUR NEGATIVE 12/27/2017 1157   Sepsis Labs Invalid input(s): PROCALCITONIN,  WBC,  LACTICIDVEN Microbiology No results found for this or any previous visit (from the past 240 hour(s)).   Time coordinating discharge: 35  minutes   [name], MD  [facility] 03/25/2018, 10:50 AM Pager   If 7PM-7AM, please contact night-coverage [email or web address] Password [email or web address]

## 2018-03-28 ENCOUNTER — Telehealth: Payer: Self-pay

## 2018-03-28 NOTE — Telephone Encounter (Signed)
Copied from [place] [phone] [phone]. Topic: Inquiry >> Mar 28, 2018  8:58 AM Pricilla Handler wrote: Reason for CRM: Patient called requesting to be worked in to see Dr. Charlett Blake on 07/11, 12, 16, or 16 for a Hospital Follow Up appt. Patient states that the doctors in the hospital want him to be seen by Dr. Charlett Blake on one of these four days for evaluation and testing. Please call patient at [phone] or [phone].     Patient scheduled for 04/19/18

## 2018-03-28 NOTE — Telephone Encounter (Signed)
Patient contacted regarding discharge from [facility] on 03/25/18.  Patient understands to follow up with provider Margaret Pyle on 7/10 at 8:45 at [facility]. Patient understands discharge instructions? yes Patient understands medications and regiment? yes Patient understands to bring all medications to this visit? yes  Pt was napping and spoke with wife (on [place]).  She reports as far as she knows pt has everything he needs and has no questions at this time.  He is aware of his p/h appt time, date and location.  They will c/b with any ?Marland Kitchen

## 2018-03-28 NOTE — Telephone Encounter (Signed)
03/28/18  Transition Care Management Follow-up Telephone Call  ADMISSION DATE: 03/22/18 DISCHARGE DATE: 03/25/18  Patient needs CBC and BMP drawn  How have you been since you were released from the hospital? Still tired but feeling better per patient.  Do you understand why you were in the hospital? Yes   Do you understand the discharge instrcutions? Yes  Items Reviewed: Medications reviewed: Yes   Allergies reviewed: Yes  Dietary changes reviewed:Low Sodium Heart healthy Referrals reviewed: Appointment scheduled with PC , Cardiology an GI  Functional Questionnaire:  Activities of Daily Living (ADLs): Patient can perform all independently except wife assists his when walking.  Any patient concerns? None at the time per patient.   Confirmed importance and date/time of follow-up visits scheduled:Yes    Confirmed with patient if condition begins to worsen call PCP or go to the ER. Yes    Patient was given the office number and encouragred to call back with questions or concerns. Yes

## 2018-04-11 ENCOUNTER — Other Ambulatory Visit: Payer: Self-pay | Admitting: Internal Medicine

## 2018-04-11 NOTE — Telephone Encounter (Signed)
Eliquis 5mg  refill request received; pt is 72 yrs old, wt-80.1kg, Crea-1.08 on 03/25/18; last seen by Lovena Le on 09/07/17; will send in a refill to requested pharmacy.

## 2018-04-13 ENCOUNTER — Telehealth: Payer: Self-pay | Admitting: *Deleted

## 2018-04-13 ENCOUNTER — Encounter: Payer: Self-pay | Admitting: Physician Assistant

## 2018-04-13 ENCOUNTER — Ambulatory Visit (INDEPENDENT_AMBULATORY_CARE_PROVIDER_SITE_OTHER): Payer: Medicare Other | Admitting: Physician Assistant

## 2018-04-13 VITALS — BP 110/82 | HR 85 | Ht 65.0 in | Wt 172.1 lb

## 2018-04-13 DIAGNOSIS — I10 Essential (primary) hypertension: Secondary | ICD-10-CM

## 2018-04-13 DIAGNOSIS — I5032 Chronic diastolic (congestive) heart failure: Secondary | ICD-10-CM | POA: Diagnosis not present

## 2018-04-13 DIAGNOSIS — I481 Persistent atrial fibrillation: Secondary | ICD-10-CM | POA: Diagnosis not present

## 2018-04-13 DIAGNOSIS — I2510 Atherosclerotic heart disease of native coronary artery without angina pectoris: Secondary | ICD-10-CM | POA: Diagnosis not present

## 2018-04-13 DIAGNOSIS — I4819 Other persistent atrial fibrillation: Secondary | ICD-10-CM

## 2018-04-13 LAB — CBC
Hematocrit: 41.6 % (ref 37.5–51.0)
Hemoglobin: 14.2 g/dL (ref 13.0–17.7)
MCH: 31.6 pg (ref 26.6–33.0)
MCHC: 34.1 g/dL (ref 31.5–35.7)
MCV: 92 fL (ref 79–97)
Platelets: 216 10*3/uL (ref 150–450)
RBC: 4.5 x10E6/uL (ref 4.14–5.80)
RDW: 13.4 % (ref 12.3–15.4)
WBC: 10.8 10*3/uL (ref 3.4–10.8)

## 2018-04-13 LAB — BASIC METABOLIC PANEL
BUN/Creatinine Ratio: 18 (ref 10–24)
BUN: 26 mg/dL (ref 8–27)
CO2: 24 mmol/L (ref 20–29)
Calcium: 9.4 mg/dL (ref 8.6–10.2)
Chloride: 97 mmol/L (ref 96–106)
Creatinine, Ser: 1.47 mg/dL — ABNORMAL HIGH (ref 0.76–1.27)
GFR calc Af Amer: 55 mL/min/{1.73_m2} — ABNORMAL LOW (ref >59)
GFR calc non Af Amer: 47 mL/min/{1.73_m2} — ABNORMAL LOW (ref >59)
Glucose: 75 mg/dL (ref 65–99)
Potassium: 4.6 mmol/L (ref 3.5–5.2)
Sodium: 138 mmol/L (ref 134–144)

## 2018-04-13 MED ORDER — POTASSIUM CHLORIDE ER 10 MEQ PO TBCR: 10.0000 meq | EXTENDED_RELEASE_TABLET | Freq: Every day | ORAL | 3 refills | Status: DC

## 2018-04-13 MED ORDER — FUROSEMIDE 20 MG PO TABS: 20.0000 mg | ORAL_TABLET | Freq: Every day | ORAL | 11 refills | Status: DC

## 2018-04-13 NOTE — H&P (View-Only) (Signed)
Cardiology Office Note:    Date:  04/13/2018   ID:  Lisette Grinder, DOB 1946-04-13, MRN [id number]  PCP:  [name], MD  Cardiologist:  Cristopher Peru, MD   Referring MD: [name], MD   Chief Complaint  Patient presents with  . Hospitalization Follow-up    CHF, atrial fibrillation    History of Present Illness:    Ryan [name] is a 72 y.o. male with nonobstructive coronary artery disease by cardiac catheterization in 2012, paroxysmal atrial fibrillation controlled on Propafenone, hypertension, diastolic heart failure.  He was last seen by Dr. Lovena Le in December 2018.  He was admitted in June 2019 with decompensated heart failure and pneumonia in the setting of recurrent atrial fibrillation with controlled rate.  Echocardiogram demonstrated normal LV function.  The patient reportedly missed 1 dose of Apixaban.  Therefore, it was recommended that he be adequately anticoagulated and return for elective cardioversion in 3-4 weeks.  Ryan [name] returns for follow-up.  He is here with his wife.  Since discharge from the hospital, he has done well.  He denies chest discomfort, shortness of breath, orthopnea, PND, syncope.  He denies any significant fatigue.  He has a heart rate monitoring device and his heart rates have been around 80.  He has had some elevations into the 120s.  Prior CV studies:   The following studies were reviewed today:  Echocardiogram 03/23/2018 Moderate LVH, EF 55-60, normal wall motion, severe LAE, moderate to severe RAE  Exercise tolerance test  Blood pressure demonstrated a normal response to exercise. Modified Bruce protocol.  There was no ST segment deviation noted during stress. Propafenone  There was no QRS widening during stress. No adverse arrhythmias, no ventricular tachycardia, no atrial tachycardia, no atrial fibrillation  There is no electrocardiographic evidence of ischemia although patient had a blunted heart rate response during  exercise. Nondiagnostic for ischemia  Echocardiogram 01/14/2017 Mild LVH, EF 55-60, normal wall motion, grade 1 diastolic dysfunction, trivial MR, severe LAE  Event monitor 11/18/2016 1. NSR with sinus bradycardia and rare sinus tachycardia 2. PAF with a controlled and rapid ventricular response 3. Occaisional PVC's and frequent PAC's 4. No prolonged pauses 4. Noise artifact  Catheterization with FFR 04/22/2011 LM FFR 0.85 LM IVUS-minimal luminal area 7 mm RCA FFR 0.89 FFR not hemodynamically significant-medical therapy  Cardiac catheterization 04/01/2011 EF 60 LM ostial 40-50 LCx irregularities LAD small in size, mid to distal 60-70 (2 mm); D2 mid 30 RCA proximal 60  Nuclear stress test 02/18/2011 Probably normal stress nuclear study. There is significant gut atttenuation over the inferior wall worse at rest than stress. The inferior wall is unable to be evaluated fully but I suspect it is OK. The remainder of the perfusion is normal.  EF 50    Past Medical History:  Diagnosis Date  . A-fib ([place]) 03/22/2018  . Anemia   . Atrial flutter ([place]) 03/31/2012   converted spontaneously to sinus  . CAD (coronary artery disease)    reportedly moderate CAD; managed medically  . COPD (chronic obstructive pulmonary disease) ([place])   . Eczema 08/03/2014  . Edema 03/22/2018  . GERD (gastroesophageal reflux disease)   . H/O: rheumatic fever   . Heart murmur   . Heart murmur   . High risk medication use    on amiodarone since 03/31/2012  . History of colonoscopy   . Hyperglycemia 06/21/2017  . Hyperlipidemia   . Hypertension   . Kidney stone 06/21/2017  . Kidney  stones   . Lobar pneumonia ([place]) 03/22/2018  . Low vitamin D level 06/21/2017  . Macular degeneration   . Osteoporosis 05/31/2016   Surgical Hx: The patient  has a past surgical history that includes Cervical discectomy; Cholecystectomy; Mouth surgery; knee cartiledge (Right); Foot surgery; Appendectomy; and Cardiac catheterization  (04/22/2011).   Current Medications: Current Meds  Medication Sig  . brimonidine (ALPHAGAN) 0.2 % ophthalmic solution INT 1 GTT IN OS BID  . cholecalciferol (VITAMIN D) 1000 units tablet Take 5,000 Units by mouth every Monday, Wednesday, and Friday.  . clobetasol (TEMOVATE) 0.05 % external solution APPLY 1 APPLICATION TOPICALLY TWICE DAILY  . ELIQUIS 5 MG TABS tablet TAKE 1 TABLET BY MOUTH TWO  TIMES DAILY  . fluconazole (DIFLUCAN) 100 MG tablet Take 100 mg by mouth as needed (thrush from back injection of steroid).  . furosemide (LASIX) 40 MG tablet Take 1 tablet (40 mg total) by mouth daily.  Marland Kitchen latanoprost (XALATAN) 0.005 % ophthalmic solution Place 1 drop into the left eye at bedtime.   . metoprolol tartrate 37.5 MG TABS Take 37.5 mg by mouth 2 (two) times daily.  . Multiple Vitamins-Minerals (ICAPS MV) TABS Take 1 tablet by mouth 2 (two) times daily.   Marland Kitchen omeprazole (PRILOSEC OTC) 20 MG tablet Take 20 mg by mouth daily.  . potassium chloride (K-DUR) 10 MEQ tablet Take 2 tablets (20 mEq total) by mouth daily.  . propafenone (RYTHMOL) 225 MG tablet Take 1 tablet (225 mg total) by mouth 2 (two) times daily.  . Ranibizumab (LUCENTIS IO) Inject into the eye as directed. (Macular Degeneration)  . tamsulosin (FLOMAX) 0.4 MG CAPS capsule Take 1 capsule (0.4 mg total) by mouth daily. (Patient taking differently: Take 0.4 mg by mouth as needed (kidney Stones). )     Allergies:   Guaifenesin; Other; Polymyxin b-trimethoprim; Codeine; Guaifenesin & derivatives; Polymyxin b; Statins; Mucinex d [pseudoephedrine-guaifenesin er]; Pseudoephedrine; Pseudoephedrine-guaifenesin; Rosuvastatin calcium; Simvastatin; Tapentadol; Ciprofloxacin; Codeine phosphate; Moxifloxacin; and Rofecoxib   Social History   Tobacco Use  . Smoking status: Former Smoker    Packs/day: 0.50    Years: 50.00    Pack years: 25.00    Types: Cigarettes    Last attempt to quit: 05/01/2013    Years since quitting: 4.9  . Smokeless  tobacco: Former Systems developer  . Tobacco comment: using e-Cig//ldc  Substance Use Topics  . Alcohol use: No  . Drug use: No     Family Hx: The patient's family history includes Arthritis in his sister and sister; Cancer in his father; Cirrhosis in his mother and sister; Coronary artery disease in his unknown relative; Diabetes in his mother; Emphysema in his mother; Heart attack in his father; Heart disease in his brother, father, mother, sister, and sister; Hyperlipidemia in his brother, father, and sister; Hypertension in his father and mother; Liver disease in his unknown relative; Macular degeneration in his maternal grandfather, mother, and sister; Obesity in his sister, sister, and sister; Peripheral vascular disease in his father; Prostate cancer in his unknown relative; Varicose Veins in his father.  ROS:   Please see the history of present illness.    Review of Systems  Cardiovascular: Positive for leg swelling.   All other systems reviewed and are negative.   EKGs/Labs/Other Test Reviewed:    EKG:  EKG is  ordered today.  The ekg ordered today demonstrates atrial fibrillation, heart rate 85  Recent Labs: 10/11/2017: Magnesium 0.8 12/27/2017: ALT 17; TSH 1.89 03/22/2018: B Natriuretic Peptide 417.0 04/13/2018:  BUN 26; Creatinine, Ser 1.47; Hemoglobin 14.2; Platelets 216; Potassium 4.6; Sodium 138   Recent Lipid Panel Lab Results  Component Value Date/Time   CHOL 232 (H) 12/27/2017 11:57 AM   TRIG 83.0 12/27/2017 11:57 AM   TRIG 43 09/06/2006 09:51 AM   HDL 47.10 12/27/2017 11:57 AM   CHOLHDL 5 12/27/2017 11:57 AM   LDLCALC 169 (H) 12/27/2017 11:57 AM   LDLDIRECT 134.2 12/01/2013 11:13 AM    Physical Exam:    VS:  BP 110/82   Pulse 85   Ht 5\' 5"  (1.651 m)   Wt 172 lb 1.9 oz (78.1 kg)   SpO2 97%   BMI 28.64 kg/m     Wt Readings from Last 3 Encounters:  04/13/18 172 lb 1.9 oz (78.1 kg)  03/22/18 178 lb 5.6 oz (80.9 kg)  12/27/17 176 lb 9.6 oz (80.1 kg)     Physical Exam   Constitutional: He is oriented to person, place, and time. He appears well-developed and well-nourished. No distress.  HENT:  Head: Normocephalic and atraumatic.  Neck: Neck supple. No JVD present.  Cardiovascular: Normal rate, S1 normal and S2 normal. An irregularly irregular rhythm present.  No murmur heard. Pulmonary/Chest: Breath sounds normal. He has no rales.  Abdominal: Soft. There is no hepatomegaly.  Musculoskeletal: He exhibits edema (trace bilat LE edema).  Neurological: He is alert and oriented to person, place, and time.  Skin: Skin is warm and dry.    ASSESSMENT & PLAN:    Persistent atrial fibrillation ([place])  He remains in atrial fibrillation with controlled ventricular rate.  He does not seem to have any symptoms related to atrial fibrillation at this time.  Continue current dose of metoprolol, Propafenone.  Arrange cardioversion next week or after.  Risks and benefits of cardioversion were discussed with the patient today.  He agrees to proceed.  Continue current dose of Apixaban.  If he remains in atrial fibrillation after cardioversion, consider stopping Propafenone and pursue rate control strategy.  This will be up to Dr. Lovena Le.  Arrange follow-up with Dr. Lovena Le after cardioversion.  Chronic diastolic heart failure (HCC)  EF 55-60.  Volume status is currently stable.  Weights have been stable.  Continue current therapy and obtain follow-up BMET today.  Coronary artery disease involving native coronary artery of native heart without angina pectoris Moderate nonobstructive coronary disease by cardiac catheterization in 2012.  He denies any anginal symptoms.  He is not on aspirin as he is on Apixaban.  He is intolerant to statins.  Essential hypertension  The patient's blood pressure is controlled on his current regimen.  Continue current therapy.    Dispo:  Return in about 1 month (around 05/11/2018) for Post Procedure Follow Up with Dr. Lovena Le.   Medication  Adjustments/Labs and Tests Ordered: Current medicines are reviewed at length with the patient today.  Concerns regarding medicines are outlined above.  Tests Ordered: Orders Placed This Encounter  Procedures  . Basic Metabolic Panel (BMET)  . CBC  . EKG 12-Lead   Medication Changes: No orders of the defined types were placed in this encounter.   Signed, Richardson Dopp, PA-C  04/13/2018 4:24 PM    [facility] [place], [place], [place]  [address] Phone: [phone]; Fax: [phone]

## 2018-04-13 NOTE — Progress Notes (Signed)
Cardiology Office Note:    Date:  04/13/2018   ID:  Ryan [name], DOB 24-Sep-1946, MRN [id number]  PCP:  Ryan Lukes, MD  Cardiologist:  Ryan Peru, MD   Referring MD: Ryan Lukes, MD   Chief Complaint  Patient presents with  . Hospitalization Follow-up    CHF, atrial fibrillation    History of Present Illness:    Ryan [name] is a 72 y.o. male with nonobstructive coronary artery disease by cardiac catheterization in 2012, paroxysmal atrial fibrillation controlled on Propafenone, hypertension, diastolic heart failure.  He was last seen by Dr. Lovena [name] in December 2018.  He was admitted in June 2019 with decompensated heart failure and pneumonia in the setting of recurrent atrial fibrillation with controlled rate.  Echocardiogram demonstrated normal LV function.  The patient reportedly missed 1 dose of Apixaban.  Therefore, it was recommended that he be adequately anticoagulated and return for elective cardioversion in 3-4 weeks.  Ryan [name] returns for follow-up.  He is here with his wife.  Since discharge from the hospital, he has done well.  He denies chest discomfort, shortness of breath, orthopnea, PND, syncope.  He denies any significant fatigue.  He has a heart rate monitoring device and his heart rates have been around 80.  He has had some elevations into the 120s.  Prior CV studies:   The following studies were reviewed today:  Echocardiogram 03/23/2018 Moderate LVH, EF 55-60, normal wall motion, severe LAE, moderate to severe RAE  Exercise tolerance test  Blood pressure demonstrated a normal response to exercise. Modified Bruce protocol.  There was no ST segment deviation noted during stress. Propafenone  There was no QRS widening during stress. No adverse arrhythmias, no ventricular tachycardia, no atrial tachycardia, no atrial fibrillation  There is no electrocardiographic evidence of ischemia although patient had a blunted heart rate response during  exercise. Nondiagnostic for ischemia  Echocardiogram 01/14/2017 Mild LVH, EF 55-60, normal wall motion, grade 1 diastolic dysfunction, trivial MR, severe LAE  Event monitor 11/18/2016 1. NSR with sinus bradycardia and rare sinus tachycardia 2. PAF with a controlled and rapid ventricular response 3. Occaisional PVC's and frequent PAC's 4. No prolonged pauses 4. Noise artifact  Catheterization with FFR 04/22/2011 LM FFR 0.85 LM IVUS-minimal luminal area 7 mm RCA FFR 0.89 FFR not hemodynamically significant-medical therapy  Cardiac catheterization 04/01/2011 EF 60 LM ostial 40-50 LCx irregularities LAD small in size, mid to distal 60-70 (2 mm); D2 mid 30 RCA proximal 60  Nuclear stress test 02/18/2011 Probably normal stress nuclear study. There is significant gut atttenuation over the inferior wall worse at rest than stress. The inferior wall is unable to be evaluated fully but I suspect it is OK. The remainder of the perfusion is normal.  EF 50    Past Medical History:  Diagnosis Date  . A-fib ([place]) 03/22/2018  . Anemia   . Atrial flutter ([place]) 03/31/2012   converted spontaneously to sinus  . CAD (coronary artery disease)    reportedly moderate CAD; managed medically  . COPD (chronic obstructive pulmonary disease) ([place])   . Eczema 08/03/2014  . Edema 03/22/2018  . GERD (gastroesophageal reflux disease)   . H/O: rheumatic fever   . Heart murmur   . Heart murmur   . High risk medication use    on amiodarone since 03/31/2012  . History of colonoscopy   . Hyperglycemia 06/21/2017  . Hyperlipidemia   . Hypertension   . Kidney stone 06/21/2017  . Kidney  stones   . Lobar pneumonia ([place]) 03/22/2018  . Low vitamin D level 06/21/2017  . Macular degeneration   . Osteoporosis 05/31/2016   Surgical Hx: The patient  has a past surgical history that includes Cervical discectomy; Cholecystectomy; Mouth surgery; knee cartiledge (Right); Foot surgery; Appendectomy; and Cardiac catheterization  (04/22/2011).   Current Medications: Current Meds  Medication Sig  . brimonidine (ALPHAGAN) 0.2 % ophthalmic solution INT 1 GTT IN OS BID  . cholecalciferol (VITAMIN D) 1000 units tablet Take 5,000 Units by mouth every Monday, Wednesday, and Friday.  . clobetasol (TEMOVATE) 0.05 % external solution APPLY 1 APPLICATION TOPICALLY TWICE DAILY  . ELIQUIS 5 MG TABS tablet TAKE 1 TABLET BY MOUTH TWO  TIMES DAILY  . fluconazole (DIFLUCAN) 100 MG tablet Take 100 mg by mouth as needed (thrush from back injection of steroid).  . furosemide (LASIX) 40 MG tablet Take 1 tablet (40 mg total) by mouth daily.  Marland Kitchen latanoprost (XALATAN) 0.005 % ophthalmic solution Place 1 drop into the left eye at bedtime.   . metoprolol tartrate 37.5 MG TABS Take 37.5 mg by mouth 2 (two) times daily.  . Multiple Vitamins-Minerals (ICAPS MV) TABS Take 1 tablet by mouth 2 (two) times daily.   Marland Kitchen omeprazole (PRILOSEC OTC) 20 MG tablet Take 20 mg by mouth daily.  . potassium chloride (K-DUR) 10 MEQ tablet Take 2 tablets (20 mEq total) by mouth daily.  . propafenone (RYTHMOL) 225 MG tablet Take 1 tablet (225 mg total) by mouth 2 (two) times daily.  . Ranibizumab (LUCENTIS IO) Inject into the eye as directed. (Macular Degeneration)  . tamsulosin (FLOMAX) 0.4 MG CAPS capsule Take 1 capsule (0.4 mg total) by mouth daily. (Patient taking differently: Take 0.4 mg by mouth as needed (kidney Stones). )     Allergies:   Guaifenesin; Other; Polymyxin b-trimethoprim; Codeine; Guaifenesin & derivatives; Polymyxin b; Statins; Mucinex d [pseudoephedrine-guaifenesin er]; Pseudoephedrine; Pseudoephedrine-guaifenesin; Rosuvastatin calcium; Simvastatin; Tapentadol; Ciprofloxacin; Codeine phosphate; Moxifloxacin; and Rofecoxib   Social History   Tobacco Use  . Smoking status: Former Smoker    Packs/day: 0.50    Years: 50.00    Pack years: 25.00    Types: Cigarettes    Last attempt to quit: 05/01/2013    Years since quitting: 4.9  . Smokeless  tobacco: Former Systems developer  . Tobacco comment: using e-Cig//ldc  Substance Use Topics  . Alcohol use: No  . Drug use: No     Family Hx: The patient's family history includes Arthritis in his sister and sister; Cancer in his father; Cirrhosis in his mother and sister; Coronary artery disease in his unknown relative; Diabetes in his mother; Emphysema in his mother; Heart attack in his father; Heart disease in his brother, father, mother, sister, and sister; Hyperlipidemia in his brother, father, and sister; Hypertension in his father and mother; Liver disease in his unknown relative; Macular degeneration in his maternal grandfather, mother, and sister; Obesity in his sister, sister, and sister; Peripheral vascular disease in his father; Prostate cancer in his unknown relative; Varicose Veins in his father.  ROS:   Please see the history of present illness.    Review of Systems  Cardiovascular: Positive for leg swelling.   All other systems reviewed and are negative.   EKGs/Labs/Other Test Reviewed:    EKG:  EKG is  ordered today.  The ekg ordered today demonstrates atrial fibrillation, heart rate 85  Recent Labs: 10/11/2017: Magnesium 0.8 12/27/2017: ALT 17; TSH 1.89 03/22/2018: B Natriuretic Peptide 417.0 04/13/2018:  BUN 26; Creatinine, Ser 1.47; Hemoglobin 14.2; Platelets 216; Potassium 4.6; Sodium 138   Recent Lipid Panel Lab Results  Component Value Date/Time   CHOL 232 (H) 12/27/2017 11:57 AM   TRIG 83.0 12/27/2017 11:57 AM   TRIG 43 09/06/2006 09:51 AM   HDL 47.10 12/27/2017 11:57 AM   CHOLHDL 5 12/27/2017 11:57 AM   LDLCALC 169 (H) 12/27/2017 11:57 AM   LDLDIRECT 134.2 12/01/2013 11:13 AM    Physical Exam:    VS:  BP 110/82   Pulse 85   Ht 5\' 5"  (1.651 m)   Wt 172 lb 1.9 oz (78.1 kg)   SpO2 97%   BMI 28.64 kg/m     Wt Readings from Last 3 Encounters:  04/13/18 172 lb 1.9 oz (78.1 kg)  03/22/18 178 lb 5.6 oz (80.9 kg)  12/27/17 176 lb 9.6 oz (80.1 kg)     Physical Exam   Constitutional: He is oriented to person, place, and time. He appears well-developed and well-nourished. No distress.  HENT:  Head: Normocephalic and atraumatic.  Neck: Neck supple. No JVD present.  Cardiovascular: Normal rate, S1 normal and S2 normal. An irregularly irregular rhythm present.  No murmur heard. Pulmonary/Chest: Breath sounds normal. He has no rales.  Abdominal: Soft. There is no hepatomegaly.  Musculoskeletal: He exhibits edema (trace bilat [name] edema).  Neurological: He is alert and oriented to person, place, and time.  Skin: Skin is warm and dry.    ASSESSMENT & PLAN:    Persistent atrial fibrillation ([place])  He remains in atrial fibrillation with controlled ventricular rate.  He does not seem to have any symptoms related to atrial fibrillation at this time.  Continue current dose of metoprolol, Propafenone.  Arrange cardioversion next week or after.  Risks and benefits of cardioversion were discussed with the patient today.  He agrees to proceed.  Continue current dose of Apixaban.  If he remains in atrial fibrillation after cardioversion, consider stopping Propafenone and pursue rate control strategy.  This will be up to Dr. Lovena [name].  Arrange follow-up with Dr. Lovena [name] after cardioversion.  Chronic diastolic heart failure (HCC)  EF 55-60.  Volume status is currently stable.  Weights have been stable.  Continue current therapy and obtain follow-up BMET today.  Coronary artery disease involving native coronary artery of native heart without angina pectoris Moderate nonobstructive coronary disease by cardiac catheterization in 2012.  He denies any anginal symptoms.  He is not on aspirin as he is on Apixaban.  He is intolerant to statins.  Essential hypertension  The patient's blood pressure is controlled on his current regimen.  Continue current therapy.    Dispo:  Return in about 1 month (around 05/11/2018) for Post Procedure Follow Up with Dr. Lovena [name].   Medication  Adjustments/Labs and Tests Ordered: Current medicines are reviewed at length with the patient today.  Concerns regarding medicines are outlined above.  Tests Ordered: Orders Placed This Encounter  Procedures  . Basic Metabolic Panel (BMET)  . CBC  . EKG 12-Lead   Medication Changes: No orders of the defined types were placed in this encounter.   Signed, Richardson Dopp, PA-C  04/13/2018 4:24 PM    [facility] [address], [place], [place]  [address] Phone: [phone]; Fax: [phone]

## 2018-04-13 NOTE — Patient Instructions (Addendum)
Medication Instructions:  Your physician recommends that you continue on your current medications as directed. Please refer to the Current Medication list given to you today.   Labwork: TODAY BMET  Testing/Procedures: Your physician has recommended that you have a Cardioversion (DCCV). Electrical Cardioversion uses a jolt of electricity to your heart either through paddles or wired patches attached to your chest. This is a controlled, usually prescheduled, procedure. Defibrillation is done under light anesthesia in the hospital, and you usually go home the day of the procedure. This is done to get your heart back into a normal rhythm. You are not awake for the procedure. Please see the instruction sheet given to you today.    Follow-Up: DR. Lovena Le POST PROCEDURE; I WILL HAVE MELISSA TATUM, SCHEDULER FOR DR. Lovena Le CALL YOU WITH AN APPT.   Any Other Special Instructions Will Be Listed Below (If Applicable).  If you need a refill on your cardiac medications before your next appointment, please call your pharmacy.   Dear Ryan [name] You are scheduled for a CARDIOVERSION 04/25/18 with Dr. Oval Linsey @ 9 AM.  Please arrive at the [facility] (Main Entrance A) at [facility]: [place], [place] [address] at 7:30 am (1 hour prior to procedure unless lab work is needed; if lab work is needed arrive 1.5 hours ahead)  DIET: Nothing to eat or drink after midnight except a sip of water with medications (see medication instructions below)  Medication Instructions: Hold LASIX THE MORNING OF PROCEDURE ON 04/25/18  Continue your anticoagulant: ELIQUIS You will need to continue your anticoagulant after your procedure until you  are told by your  Provider that it is safe to stop   Labs: If patient is on Coumadin, patient needs pt/INR, CBC, BMET within 3 days (No pt/INR needed for patients taking Xarelto, Eliquis, Pradaxa) For patients receiving anesthesia for TEE and all  Cardioversion patients: BMET, CBC within 1 week  Come to:  the lab at [facility] between the hours of 8:00 am and 4:30 pm. You do not have to be fasting.  You must have a responsible person to drive you home and stay in the waiting area during your procedure. Failure to do so could result in cancellation.  Bring your insurance cards.  *Special Note: Every effort is made to have your procedure done on time. Occasionally there are emergencies that occur at the hospital that may cause delays. Please be patient if a delay does occur.

## 2018-04-13 NOTE — Telephone Encounter (Signed)
Pt aware to hold lasix and K+ tomorrow and resume lasix 20 mg daily and K+ 10 meq daily beginning on Friday 7/12. Pt states he has appt with PCP next week and will have PCP get bmet. Advised if lab work not done with PCP will need to get lab work in our office.   ------  Notes recorded by Michae Kava, CMA on 04/13/2018 at 5:06 PM EDT Pt has been notified of lab results by phone with verbal understanding. Pt aware to decrease lasix to 20 mg daily; decrease K+ to 10 meq daily. Pt request new Rx to be sent in for 30 dys on lasix and 90 dys on K+ to [facility] in [place]. Pt gave verbal read back x 2 with verbal understanding. Pt thanked me for the call. ------

## 2018-04-19 ENCOUNTER — Ambulatory Visit (INDEPENDENT_AMBULATORY_CARE_PROVIDER_SITE_OTHER): Payer: Medicare Other | Admitting: Family Medicine

## 2018-04-19 ENCOUNTER — Encounter: Payer: Self-pay | Admitting: Family Medicine

## 2018-04-19 VITALS — BP 132/80 | HR 75 | Resp 16 | Ht 65.0 in | Wt 176.0 lb

## 2018-04-19 DIAGNOSIS — R739 Hyperglycemia, unspecified: Secondary | ICD-10-CM | POA: Diagnosis not present

## 2018-04-19 DIAGNOSIS — I4892 Unspecified atrial flutter: Secondary | ICD-10-CM | POA: Diagnosis not present

## 2018-04-19 DIAGNOSIS — R7989 Other specified abnormal findings of blood chemistry: Secondary | ICD-10-CM

## 2018-04-19 DIAGNOSIS — R609 Edema, unspecified: Secondary | ICD-10-CM | POA: Diagnosis not present

## 2018-04-19 DIAGNOSIS — I10 Essential (primary) hypertension: Secondary | ICD-10-CM

## 2018-04-19 DIAGNOSIS — I482 Chronic atrial fibrillation, unspecified: Secondary | ICD-10-CM

## 2018-04-19 DIAGNOSIS — K5909 Other constipation: Secondary | ICD-10-CM

## 2018-04-19 DIAGNOSIS — E559 Vitamin D deficiency, unspecified: Secondary | ICD-10-CM

## 2018-04-19 DIAGNOSIS — E782 Mixed hyperlipidemia: Secondary | ICD-10-CM | POA: Diagnosis not present

## 2018-04-19 DIAGNOSIS — I2510 Atherosclerotic heart disease of native coronary artery without angina pectoris: Secondary | ICD-10-CM

## 2018-04-19 DIAGNOSIS — N289 Disorder of kidney and ureter, unspecified: Secondary | ICD-10-CM | POA: Diagnosis not present

## 2018-04-19 LAB — LIPID PANEL
Cholesterol: 222 mg/dL — ABNORMAL HIGH (ref 0–200)
HDL: 48 mg/dL (ref >39.00)
LDL Cholesterol: 158 mg/dL — ABNORMAL HIGH (ref 0–99)
NonHDL: 174.38
Total CHOL/HDL Ratio: 5
Triglycerides: 81 mg/dL (ref 0.0–149.0)
VLDL: 16.2 mg/dL (ref 0.0–40.0)

## 2018-04-19 LAB — CBC
HCT: 38.6 % — ABNORMAL LOW (ref 39.0–52.0)
Hemoglobin: 13.4 g/dL (ref 13.0–17.0)
MCHC: 34.6 g/dL (ref 30.0–36.0)
MCV: 91 fl (ref 78.0–100.0)
Platelets: 190 10*3/uL (ref 150.0–400.0)
RBC: 4.24 Mil/uL (ref 4.22–5.81)
RDW: 13.7 % (ref 11.5–15.5)
WBC: 8.7 10*3/uL (ref 4.0–10.5)

## 2018-04-19 LAB — COMPREHENSIVE METABOLIC PANEL
ALT: 13 U/L (ref 0–53)
AST: 17 U/L (ref 0–37)
Albumin: 4.2 g/dL (ref 3.5–5.2)
Alkaline Phosphatase: 50 U/L (ref 39–117)
BUN: 18 mg/dL (ref 6–23)
CO2: 29 mEq/L (ref 19–32)
Calcium: 9 mg/dL (ref 8.4–10.5)
Chloride: 101 mEq/L (ref 96–112)
Creatinine, Ser: 1.18 mg/dL (ref 0.40–1.50)
GFR: 64.63 mL/min (ref >60.00)
Glucose, Bld: 92 mg/dL (ref 70–99)
Potassium: 4.3 mEq/L (ref 3.5–5.1)
Sodium: 138 mEq/L (ref 135–145)
Total Bilirubin: 0.8 mg/dL (ref 0.2–1.2)
Total Protein: 7.1 g/dL (ref 6.0–8.3)

## 2018-04-19 LAB — HEMOGLOBIN A1C: Hgb A1c MFr Bld: 5.4 % (ref 4.6–6.5)

## 2018-04-19 LAB — VITAMIN D 25 HYDROXY (VIT D DEFICIENCY, FRACTURES): VITD: 56.18 ng/mL (ref 30.00–100.00)

## 2018-04-19 LAB — TSH: TSH: 2.13 u[IU]/mL (ref 0.35–4.50)

## 2018-04-19 MED ORDER — FLUCONAZOLE 100 MG PO TABS: ORAL_TABLET | ORAL | 2 refills | Status: DC

## 2018-04-19 NOTE — Patient Instructions (Signed)
Edema Edema is an abnormal buildup of fluids in your bodytissues. Edema is somewhatdependent on gravity to pull the fluid to the lowest place in your body. That makes the condition more common in the legs and thighs (lower extremities). Painless swelling of the feet and ankles is common and becomes more likely as you get older. It is also common in looser tissues, like around your eyes. When the affected area is squeezed, the fluid may move out of that spot and leave a dent for a few moments. This dent is called pitting. What are the causes? There are many possible causes of edema. Eating too much salt and being on your feet or sitting for a long time can cause edema in your legs and ankles. Hot weather may make edema worse. Common medical causes of edema include:  Heart failure.  Liver disease.  Kidney disease.  Weak blood vessels in your legs.  Cancer.  An injury.  Pregnancy.  Some medications.  Obesity.  What are the signs or symptoms? Edema is usually painless.Your skin may look swollen or shiny. How is this diagnosed? Your health care provider may be able to diagnose edema by asking about your medical history and doing a physical exam. You may need to have tests such as X-rays, an electrocardiogram, or blood tests to check for medical conditions that may cause edema. How is this treated? Edema treatment depends on the cause. If you have heart, liver, or kidney disease, you need the treatment appropriate for these conditions. General treatment may include:  Elevation of the affected body part above the level of your heart.  Compression of the affected body part. Pressure from elastic bandages or support stockings squeezes the tissues and forces fluid back into the blood vessels. This keeps fluid from entering the tissues.  Restriction of fluid and salt intake.  Use of a water pill (diuretic). These medications are appropriate only for some types of edema. They pull fluid  out of your body and make you urinate more often. This gets rid of fluid and reduces swelling, but diuretics can have side effects. Only use diuretics as directed by your health care provider.  Follow these instructions at home:  Keep the affected body part above the level of your heart when you are lying down.  Do not sit still or stand for prolonged periods.  Do not put anything directly under your knees when lying down.  Do not wear constricting clothing or garters on your upper legs.  Exercise your legs to work the fluid back into your blood vessels. This may help the swelling go down.  Wear elastic bandages or support stockings to reduce ankle swelling as directed by your health care provider.  Eat a low-salt diet to reduce fluid if your health care provider recommends it.  Only take medicines as directed by your health care provider. Contact a health care provider if:  Your edema is not responding to treatment.  You have heart, liver, or kidney disease and notice symptoms of edema.  You have edema in your legs that does not improve after elevating them.  You have sudden and unexplained weight gain. Get help right away if:  You develop shortness of breath or chest pain.  You cannot breathe when you lie down.  You develop pain, redness, or warmth in the swollen areas.  You have heart, liver, or kidney disease and suddenly get edema.  You have a fever and your symptoms suddenly get worse. This information is   not intended to replace advice given to you by your health care provider. Make sure you discuss any questions you have with your health care provider. Document Released: 09/21/2005 Document Revised: 02/27/2016 Document Reviewed: 07/14/2013 Elsevier Interactive Patient Education  2017 Elsevier Inc.  

## 2018-04-20 ENCOUNTER — Telehealth: Payer: Self-pay

## 2018-04-20 ENCOUNTER — Encounter: Payer: Self-pay | Admitting: Family Medicine

## 2018-04-20 NOTE — Telephone Encounter (Signed)
Notes recorded by Frederik Schmidt, RN on 04/20/2018 at 11:33 AM EDT Informed patient of results and recommendations. He verbalized understanding. ------

## 2018-04-20 NOTE — Telephone Encounter (Signed)
-----   Message from Liliane Shi, [place] sent at 04/20/2018  6:58 AM EDT ----- Reviewed labs done by PCP yesterday.  Kidney function is now normal.  Continue current medications and follow up as planned.  Richardson Dopp, PA-C    04/20/2018 6:57 AM

## 2018-04-21 DIAGNOSIS — H353110 Nonexudative age-related macular degeneration, right eye, stage unspecified: Secondary | ICD-10-CM | POA: Diagnosis not present

## 2018-04-21 DIAGNOSIS — H35363 Drusen (degenerative) of macula, bilateral: Secondary | ICD-10-CM | POA: Diagnosis not present

## 2018-04-21 DIAGNOSIS — H353221 Exudative age-related macular degeneration, left eye, with active choroidal neovascularization: Secondary | ICD-10-CM | POA: Diagnosis not present

## 2018-04-24 NOTE — Assessment & Plan Note (Signed)
hgba1c acceptable, minimize simple carbs. Increase exercise as tolerated. Continue current meds 

## 2018-04-24 NOTE — Assessment & Plan Note (Signed)
Encouraged increased hydration and fiber in diet. Daily probiotics. If bowels not moving can use MOM 2 tbls po in 4 oz of warm prune juice by mouth every 2-3 days. If no results then repeat in 4 hours with  Dulcolax suppository pr, may repeat again in 4 more hours as needed. Seek care if symptoms worsen. Consider daily Miralax and/or Dulcolax if symptoms persist.  

## 2018-04-24 NOTE — Assessment & Plan Note (Deleted)
Is rate contolled at present but has been scheduled fo cardioversion this week

## 2018-04-24 NOTE — Assessment & Plan Note (Signed)
Encouraged heart healthy diet, increase exercise, avoid trans fats, consider a krill oil cap daily 

## 2018-04-24 NOTE — Assessment & Plan Note (Signed)
Right>left but b/l. Encouraged to elevate feet above heart tid, try compression nose and minimize sodium

## 2018-04-24 NOTE — Assessment & Plan Note (Signed)
Is rate contolled at present but has been scheduled fo cardioversion this week

## 2018-04-24 NOTE — Progress Notes (Signed)
Subjective:    Patient ID: Ryan [name], male    DOB: Feb 28, 1946, 72 y.o.   MRN: [id number]  Chief Complaint  Patient presents with  . Hospitalization Follow-up    leg swelling, pneumonia, afib,needing lab work, cardioversion next week    HPI Patient is in today for follow up accompanied by his wife. He is vey anxious about his current state of health but he does acknowledge some improvement. He is scheduled for a cardioversion later this week. He continues to struggle with back pain but it is stable. He has some pedal edema bilaterally but no recent febrile illness. Denies CP/palp/SOB/HA/congestion/fevers/GI or GU c/o. Taking meds as prescribed  Past Medical History:  Diagnosis Date  . A-fib ([place]) 03/22/2018  . Anemia   . Atrial flutter ([place]) 03/31/2012   converted spontaneously to sinus  . CAD (coronary artery disease)    reportedly moderate CAD; managed medically  . COPD (chronic obstructive pulmonary disease) ([place])   . Eczema 08/03/2014  . Edema 03/22/2018  . GERD (gastroesophageal reflux disease)   . H/O: rheumatic fever   . Heart murmur   . Heart murmur   . High risk medication use    on amiodarone since 03/31/2012  . History of colonoscopy   . Hyperglycemia 06/21/2017  . Hyperlipidemia   . Hypertension   . Kidney stone 06/21/2017  . Kidney stones   . Lobar pneumonia ([place]) 03/22/2018  . Low vitamin D level 06/21/2017  . Macular degeneration   . Osteoporosis 05/31/2016    Past Surgical History:  Procedure Laterality Date  . APPENDECTOMY    . CARDIAC CATHETERIZATION  04/22/2011   moderate left main and RCA stenosis not significant by FFR and IVUS on medical therapy  . CERVICAL DISCECTOMY     C5,6,7 disc fused  with plate and 5 1" screws  . CHOLECYSTECTOMY    . FOOT SURGERY     right calcification removed from top of foot  . knee cartiledge Right    right knee  . MOUTH SURGERY     periodontal surgery, bridges, splint in front,     Family History  Problem  Relation Age of Onset  . Heart disease Mother   . Diabetes Mother   . Cirrhosis Mother   . Emphysema Mother        never smoked but 2nd hand through her spouse  . Hypertension Mother   . Macular degeneration Mother   . Heart disease Father   . Cancer Father        prostate  . Hyperlipidemia Father   . Hypertension Father   . Varicose Veins Father   . Heart attack Father   . Peripheral vascular disease Father   . Heart disease Sister   . Arthritis Sister   . Hyperlipidemia Sister   . Obesity Sister   . Macular degeneration Sister   . Heart disease Brother        5 stents  . Hyperlipidemia Brother   . Macular degeneration Maternal Grandfather   . Cirrhosis Sister   . Obesity Sister   . Arthritis Sister   . Heart disease Sister   . Obesity Sister   . Liver disease Unknown   . Prostate cancer Unknown   . Coronary artery disease Unknown     Social History   Socioeconomic History  . Marital status: Married    Spouse name: Not on file  . Number of children: 0  . Years of education: Not on  file  . Highest education level: Not on file  Occupational History  . Occupation: retired    Fish farm manager: RETIRED  Social Needs  . Financial resource strain: Not on file  . Food insecurity:    Worry: Not on file    Inability: Not on file  . Transportation needs:    Medical: Not on file    Non-medical: Not on file  Tobacco Use  . Smoking status: Former Smoker    Packs/day: 0.50    Years: 50.00    Pack years: 25.00    Types: Cigarettes    Last attempt to quit: 05/01/2013    Years since quitting: 4.9  . Smokeless tobacco: Former Systems developer  . Tobacco comment: using [employer]ldc  Substance and Sexual Activity  . Alcohol use: No  . Drug use: No  . Sexual activity: Yes    Comment: lives with wife, no dietary restrictions  Lifestyle  . Physical activity:    Days per week: Not on file    Minutes per session: Not on file  . Stress: Not on file  Relationships  . Social connections:     Talks on phone: Not on file    Gets together: Not on file    Attends religious service: Not on file    Active member of club or organization: Not on file    Attends meetings of clubs or organizations: Not on file    Relationship status: Not on file  . Intimate partner violence:    Fear of current or ex partner: Not on file    Emotionally abused: Not on file    Physically abused: Not on file    Forced sexual activity: Not on file  Other Topics Concern  . Not on file  Social History Narrative  . Not on file    Outpatient Medications Prior to Visit  Medication Sig Dispense Refill  . amoxicillin (AMOXIL) 500 MG capsule Take 2,000 mg by mouth See admin instructions. Take 2000 mg 1 hour prior to dental work    . brimonidine (ALPHAGAN) 0.2 % ophthalmic solution Instill 1 drop into the left eye twice daily  7  . Cholecalciferol (VITAMIN D3) 5000 units CAPS Take 5,000 Units by mouth every Monday, Wednesday, and Friday.    . clobetasol (TEMOVATE) 0.05 % external solution APPLY 1 APPLICATION TOPICALLY TWICE DAILY (Patient taking differently: Apply 1 application topically 2 (two) times daily as needed (psoriasis). ) 100 mL 3  . ELIQUIS 5 MG TABS tablet TAKE 1 TABLET BY MOUTH TWO  TIMES DAILY 180 tablet 1  . furosemide (LASIX) 20 MG tablet Take 1 tablet (20 mg total) by mouth daily. 30 tablet 11  . latanoprost (XALATAN) 0.005 % ophthalmic solution Place 1 drop into the left eye at bedtime.     . metoprolol tartrate (LOPRESSOR) 25 MG tablet Take 37.5 mg by mouth 2 (two) times daily.    . metoprolol tartrate 37.5 MG TABS Take 37.5 mg by mouth 2 (two) times daily. 60 tablet 0  . Multiple Vitamins-Minerals (ICAPS MV) TABS Take 1 tablet by mouth 2 (two) times daily.     Marland Kitchen omeprazole (PRILOSEC OTC) 20 MG tablet Take 20 mg by mouth daily.    . potassium chloride (K-DUR) 10 MEQ tablet Take 1 tablet (10 mEq total) by mouth daily. 90 tablet 3  . propafenone (RYTHMOL) 225 MG tablet Take 1 tablet (225 mg  total) by mouth 2 (two) times daily. 180 tablet 3  . Ranibizumab (LUCENTIS IO) Inject  1 Dose into the eye every 3 (three) months. (Macular Degeneration)    . tamsulosin (FLOMAX) 0.4 MG CAPS capsule Take 1 capsule (0.4 mg total) by mouth daily. (Patient taking differently: Take 0.4 mg by mouth at bedtime. ) 30 capsule 0  . fluconazole (DIFLUCAN) 100 MG tablet Take 100 mg by mouth as needed (thrush from back injection of steroid).    Marland Kitchen amoxicillin-clavulanate (AUGMENTIN) 875-125 MG tablet Take 1 tablet by mouth 2 (two) times daily with a meal. (Patient not taking: Reported on 04/13/2018) 4 tablet 0   No facility-administered medications prior to visit.     Allergies  Allergen Reactions  . Mucinex D [Pseudoephedrine-Guaifenesin Er] Anaphylaxis    Stomach cramps  . Polymyxin B-Trimethoprim Swelling    Eye drops made eyes swell  . Codeine Hives, Itching and Rash  . Statins Other (See Comments)    Muscle cramps   . Pseudoephedrine-Guaifenesin Nausea And Vomiting    Stomach cramps  . Tapentadol Other (See Comments)    unknown   . Ciprofloxacin Hives, Itching, Nausea Only and Rash  . Moxifloxacin Nausea Only and Other (See Comments)    Stomach cramps  . Rofecoxib     Stomach cramping     Review of Systems  Constitutional: Positive for malaise/fatigue. Negative for fever.  HENT: Negative for congestion.   Eyes: Negative for blurred vision.  Respiratory: Negative for shortness of breath.   Cardiovascular: Positive for leg swelling. Negative for chest pain and palpitations.  Gastrointestinal: Negative for abdominal pain, blood in stool and nausea.  Genitourinary: Negative for dysuria and frequency.  Musculoskeletal: Positive for back pain. Negative for falls.  Skin: Negative for rash.  Neurological: Negative for dizziness, loss of consciousness and headaches.  Endo/Heme/Allergies: Negative for environmental allergies.  Psychiatric/Behavioral: Positive for depression. The patient is  nervous/anxious.        Objective:    Physical Exam  Constitutional: He is oriented to person, place, and time. He appears well-developed and well-nourished. No distress.  HENT:  Head: Normocephalic and atraumatic.  Nose: Nose normal.  Eyes: Right eye exhibits no discharge. Left eye exhibits no discharge.  Neck: Normal range of motion. Neck supple.  Cardiovascular: Normal rate and regular rhythm.  No murmur heard. Pulmonary/Chest: Effort normal and breath sounds normal.  Abdominal: Soft. Bowel sounds are normal. There is no tenderness.  Musculoskeletal: He exhibits edema.  Neurological: He is alert and oriented to person, place, and time.  Skin: Skin is warm and dry.  Psychiatric: He has a normal mood and affect.  Nursing note and vitals reviewed.   BP 132/80 (BP Location: Left Arm, Patient Position: Sitting, Cuff Size: Normal)   Pulse 75   Resp 16   Ht 5\' 5"  (1.651 m)   Wt 176 lb (79.8 kg)   SpO2 98%   BMI 29.29 kg/m  Wt Readings from Last 3 Encounters:  04/19/18 176 lb (79.8 kg)  04/13/18 172 lb 1.9 oz (78.1 kg)  03/22/18 178 lb 5.6 oz (80.9 kg)     Lab Results  Component Value Date   WBC 8.7 04/19/2018   HGB 13.4 04/19/2018   HCT 38.6 (L) 04/19/2018   PLT 190.0 04/19/2018   GLUCOSE 92 04/19/2018   CHOL 222 (H) 04/19/2018   TRIG 81.0 04/19/2018   HDL 48.00 04/19/2018   LDLDIRECT 134.2 12/01/2013   LDLCALC 158 (H) 04/19/2018   ALT 13 04/19/2018   AST 17 04/19/2018   NA 138 04/19/2018   K 4.3 04/19/2018  CL 101 04/19/2018   CREATININE 1.18 04/19/2018   BUN 18 04/19/2018   CO2 29 04/19/2018   TSH 2.13 04/19/2018   PSA 1.64 01/14/2015   INR 1.17 04/01/2012   HGBA1C 5.4 04/19/2018    Lab Results  Component Value Date   TSH 2.13 04/19/2018   Lab Results  Component Value Date   WBC 8.7 04/19/2018   HGB 13.4 04/19/2018   HCT 38.6 (L) 04/19/2018   MCV 91.0 04/19/2018   PLT 190.0 04/19/2018   Lab Results  Component Value Date   NA 138 04/19/2018    K 4.3 04/19/2018   CO2 29 04/19/2018   GLUCOSE 92 04/19/2018   BUN 18 04/19/2018   CREATININE 1.18 04/19/2018   BILITOT 0.8 04/19/2018   ALKPHOS 50 04/19/2018   AST 17 04/19/2018   ALT 13 04/19/2018   PROT 7.1 04/19/2018   ALBUMIN 4.2 04/19/2018   CALCIUM 9.0 04/19/2018   ANIONGAP 14 03/25/2018   GFR 64.63 04/19/2018   Lab Results  Component Value Date   CHOL 222 (H) 04/19/2018   Lab Results  Component Value Date   HDL 48.00 04/19/2018   Lab Results  Component Value Date   LDLCALC 158 (H) 04/19/2018   Lab Results  Component Value Date   TRIG 81.0 04/19/2018   Lab Results  Component Value Date   CHOLHDL 5 04/19/2018   Lab Results  Component Value Date   HGBA1C 5.4 04/19/2018       Assessment & Plan:   Problem List Items Addressed This Visit    Hyperlipidemia, mixed    Encouraged heart healthy diet, increase exercise, avoid trans fats, consider a krill oil cap daily      Relevant Orders   Lipid panel (Completed)   Essential hypertension   Relevant Orders   CBC (Completed)   TSH (Completed)   RESOLVED: Atrial flutter with rapid ventricular response (HCC)   Chronic constipation    Encouraged increased hydration and fiber in diet. Daily probiotics. If bowels not moving can use MOM 2 tbls po in 4 oz of warm prune juice by mouth every 2-3 days. If no results then repeat in 4 hours with  Dulcolax suppository pr, may repeat again in 4 more hours as needed. Seek care if symptoms worsen. Consider daily Miralax and/or Dulcolax if symptoms persist.       Low vitamin D level   Relevant Orders   VITAMIN D 25 Hydroxy (Vit-D Deficiency, Fractures) (Completed)   Hyperglycemia    hgba1c acceptable, minimize simple carbs. Increase exercise as tolerated. Continue current meds      Relevant Orders   Hemoglobin A1c (Completed)   A-fib (HCC)    Is rate contolled at present but has been scheduled fo cardioversion this week      Edema    Right>left but b/l.  Encouraged to elevate feet above heart tid, try compression nose and minimize sodium       Other Visit Diagnoses    Renal insufficiency    -  Primary   Relevant Orders   Comprehensive metabolic panel (Completed)   Vitamin D deficiency       Relevant Orders   VITAMIN D 25 Hydroxy (Vit-D Deficiency, Fractures) (Completed)      I have discontinued Reynolds L. [place]elaw "L"'s amoxicillin-clavulanate. I have also changed his fluconazole. Additionally, I am having him maintain his ICAPS MV, omeprazole, Ranibizumab (LUCENTIS IO), latanoprost, clobetasol, tamsulosin, propafenone, brimonidine, Metoprolol Tartrate, ELIQUIS, furosemide, potassium chloride, amoxicillin, metoprolol tartrate,  and Vitamin D3.  Meds ordered this encounter  Medications  . fluconazole (DIFLUCAN) 100 MG tablet    Sig: 1 tab po daily x 3 days can repeat in 1 week    Dispense:  6 tablet    Refill:  2     Penni Homans, MD

## 2018-04-25 ENCOUNTER — Ambulatory Visit (HOSPITAL_COMMUNITY)
Admission: RE | Admit: 2018-04-25 | Discharge: 2018-04-25 | Disposition: A | Discharge status: Home / Self Care | Payer: Medicare Other | Source: Ambulatory Visit | Attending: Cardiovascular Disease | Admitting: Cardiovascular Disease

## 2018-04-25 ENCOUNTER — Encounter (HOSPITAL_COMMUNITY)
Admission: RE | Disposition: A | Discharge status: Home / Self Care | Payer: Self-pay | Source: Ambulatory Visit | Attending: Cardiovascular Disease

## 2018-04-25 ENCOUNTER — Ambulatory Visit (HOSPITAL_COMMUNITY): Payer: Medicare Other | Admitting: Anesthesiology

## 2018-04-25 ENCOUNTER — Encounter (HOSPITAL_COMMUNITY): Payer: Self-pay | Admitting: *Deleted

## 2018-04-25 DIAGNOSIS — M810 Age-related osteoporosis without current pathological fracture: Secondary | ICD-10-CM | POA: Insufficient documentation

## 2018-04-25 DIAGNOSIS — I5032 Chronic diastolic (congestive) heart failure: Secondary | ICD-10-CM | POA: Diagnosis not present

## 2018-04-25 DIAGNOSIS — I10 Essential (primary) hypertension: Secondary | ICD-10-CM | POA: Diagnosis not present

## 2018-04-25 DIAGNOSIS — I2510 Atherosclerotic heart disease of native coronary artery without angina pectoris: Secondary | ICD-10-CM | POA: Diagnosis not present

## 2018-04-25 DIAGNOSIS — Z87891 Personal history of nicotine dependence: Secondary | ICD-10-CM | POA: Insufficient documentation

## 2018-04-25 DIAGNOSIS — E785 Hyperlipidemia, unspecified: Secondary | ICD-10-CM | POA: Insufficient documentation

## 2018-04-25 DIAGNOSIS — K219 Gastro-esophageal reflux disease without esophagitis: Secondary | ICD-10-CM | POA: Diagnosis not present

## 2018-04-25 DIAGNOSIS — Z7901 Long term (current) use of anticoagulants: Secondary | ICD-10-CM | POA: Diagnosis not present

## 2018-04-25 DIAGNOSIS — I4819 Other persistent atrial fibrillation: Secondary | ICD-10-CM

## 2018-04-25 DIAGNOSIS — I481 Persistent atrial fibrillation: Secondary | ICD-10-CM

## 2018-04-25 DIAGNOSIS — I110 Hypertensive heart disease with heart failure: Secondary | ICD-10-CM | POA: Diagnosis not present

## 2018-04-25 DIAGNOSIS — D649 Anemia, unspecified: Secondary | ICD-10-CM | POA: Insufficient documentation

## 2018-04-25 DIAGNOSIS — J449 Chronic obstructive pulmonary disease, unspecified: Secondary | ICD-10-CM | POA: Diagnosis not present

## 2018-04-25 DIAGNOSIS — I4891 Unspecified atrial fibrillation: Secondary | ICD-10-CM | POA: Diagnosis not present

## 2018-04-25 HISTORY — PX: CARDIOVERSION: SHX1299

## 2018-04-25 SURGERY — CARDIOVERSION
Anesthesia: General

## 2018-04-25 MED ORDER — SODIUM CHLORIDE 0.9% FLUSH: 3.0000 mL | Freq: Two times a day (BID) | INTRAVENOUS | Status: DC

## 2018-04-25 MED ORDER — SODIUM CHLORIDE 0.9% FLUSH: 3.0000 mL | INTRAVENOUS | Status: DC | PRN

## 2018-04-25 MED ORDER — METOPROLOL TARTRATE 37.5 MG PO TABS: 25.0000 mg | ORAL_TABLET | Freq: Two times a day (BID) | ORAL | 0 refills | Status: DC

## 2018-04-25 MED ORDER — PROPOFOL 10 MG/ML IV BOLUS
INTRAVENOUS | Status: DC | PRN
  Administered 2018-04-25: 70 mg via INTRAVENOUS

## 2018-04-25 MED ORDER — SODIUM CHLORIDE 0.9 % IV SOLN
250.0000 mL | INTRAVENOUS | Status: DC
  Administered 2018-04-25: 08:00:00 via INTRAVENOUS

## 2018-04-25 MED ORDER — LIDOCAINE 2% (20 MG/ML) 5 ML SYRINGE
INTRAMUSCULAR | Status: DC | PRN
  Administered 2018-04-25: 80 mg via INTRAVENOUS

## 2018-04-25 MED ORDER — HYDROCORTISONE 1 % EX CREA: 1.0000 "application " | TOPICAL_CREAM | Freq: Three times a day (TID) | CUTANEOUS | Status: DC | PRN

## 2018-04-25 NOTE — Anesthesia Preprocedure Evaluation (Addendum)
Anesthesia Evaluation  Patient identified by MRN, date of birth, ID band Patient awake    Reviewed: Allergy & Precautions, NPO status , Patient's Chart, lab work & pertinent test results  Airway Mallampati: I  TM Distance: >3 FB Neck ROM: Full    Dental no notable dental hx. (+) Dental Advisory Given, Partial Lower   Pulmonary COPD, former smoker,    Pulmonary exam normal breath sounds clear to auscultation       Cardiovascular Exercise Tolerance: Good hypertension, Pt. on home beta blockers + CAD  Normal cardiovascular exam+ dysrhythmias Atrial Fibrillation  Rhythm:Regular Rate:Normal  03/22/18 ECHO Left ventricle: The cavity size was normal. Wall thickness was   increased in a pattern of moderate LVH. Systolic function was   normal. The estimated ejection fraction was in the range of 55%   to 60%. Wall motion was normal   Neuro/Psych negative neurological ROS     GI/Hepatic negative GI ROS, Neg liver ROS, GERD  Medicated and Controlled,  Endo/Other  negative endocrine ROS  Renal/GU Renal InsufficiencyRenal disease     Musculoskeletal   Abdominal   Peds  Hematology  (+) anemia ,   Anesthesia Other Findings   Reproductive/Obstetrics                           Lab Results  Component Value Date   CREATININE 1.18 04/19/2018   BUN 18 04/19/2018   NA 138 04/19/2018   K 4.3 04/19/2018   CL 101 04/19/2018   CO2 29 04/19/2018    Lab Results  Component Value Date   WBC 8.7 04/19/2018   HGB 13.4 04/19/2018   HCT 38.6 (L) 04/19/2018   MCV 91.0 04/19/2018   PLT 190.0 04/19/2018    Anesthesia Physical Anesthesia Plan  ASA: III  Anesthesia Plan: General   Post-op Pain Management:    Induction: Intravenous  PONV Risk Score and Plan: Treatment may vary due to age or medical condition  Airway Management Planned: Mask  Additional Equipment:   Intra-op Plan:   Post-operative  Plan:   Informed Consent: I have reviewed the patients History and Physical, chart, labs and discussed the procedure including the risks, benefits and alternatives for the proposed anesthesia with the patient or authorized representative who has indicated his/her understanding and acceptance.     Plan Discussed with: CRNA and Anesthesiologist  Anesthesia Plan Comments:         Anesthesia Quick Evaluation

## 2018-04-25 NOTE — Transfer of Care (Signed)
Immediate Anesthesia Transfer of Care Note  Patient: Ryan [name]  Procedure(s) Performed: CARDIOVERSION (N/A )  Patient Location: Endoscopy Unit  Anesthesia Type:General  Level of Consciousness: awake, alert  and oriented  Airway & Oxygen Therapy: Patient Spontanous Breathing and Patient connected to nasal cannula oxygen  Post-op Assessment: Report given to RN, Post -op Vital signs reviewed and stable and Patient moving all extremities X 4  Post vital signs: Reviewed and stable  Last Vitals:  Vitals Value Taken Time  BP 140/77 04/25/2018  9:22 AM  Temp    Pulse 56 04/25/2018  9:22 AM  Resp 19 04/25/2018  9:22 AM  SpO2 99 % 04/25/2018  9:22 AM  Vitals shown include unvalidated device data.  Last Pain:  Vitals:   04/25/18 0922  TempSrc:   PainSc: 0-No pain         Complications: No apparent anesthesia complications

## 2018-04-25 NOTE — Anesthesia Postprocedure Evaluation (Signed)
Anesthesia Post Note  Patient: Ryan [name]  Procedure(s) Performed: CARDIOVERSION (N/A )     Patient location during evaluation: Endoscopy Anesthesia Type: General Level of consciousness: awake and alert Pain management: pain level controlled Vital Signs Assessment: post-procedure vital signs reviewed and stable Respiratory status: spontaneous breathing, nonlabored ventilation, respiratory function stable and patient connected to nasal cannula oxygen Cardiovascular status: blood pressure returned to baseline and stable Postop Assessment: no apparent nausea or vomiting Anesthetic complications: no    Last Vitals:  Vitals:   04/25/18 0741  BP: 140/81  Pulse: 89  Resp: 20  Temp: 36.6 C  SpO2: 97%    Last Pain:  Vitals:   04/25/18 0741  TempSrc: Oral  PainSc: 0-No pain                 Barnet Glasgow

## 2018-04-25 NOTE — Interval H&P Note (Signed)
History and Physical Interval Note:  04/25/2018 8:22 AM  Ryan [name]  has presented today for surgery, with the diagnosis of afib  The various methods of treatment have been discussed with the patient and family. After consideration of risks, benefits and other options for treatment, the patient has consented to  Procedure(s): CARDIOVERSION (N/A) as a surgical intervention .  The patient's history has been reviewed, patient examined, no change in status, stable for surgery.  I have reviewed the patient's chart and labs.  Questions were answered to the patient's satisfaction.     Skeet Latch, MD

## 2018-04-25 NOTE — CV Procedure (Addendum)
Electrical Cardioversion Procedure Note Ryan [name] [id number] 10-01-1946  Procedure: Electrical Cardioversion Indications:  Atrial Fibrillation  Procedure Details Consent: Risks of procedure as well as the alternatives and risks of each were explained to the (patient/caregiver).  Consent for procedure obtained. Time Out: Verified patient identification, verified procedure, site/side was marked, verified correct patient position, special equipment/implants available, medications/allergies/relevent history reviewed, required imaging and test results available.  Performed  Patient placed on cardiac monitor, pulse oximetry, supplemental oxygen as necessary.  Sedation given: propofol 70 mg Pacer pads placed anterior and posterior chest.  Cardioverted 1 time(s).  Cardioverted at 150J.  Evaluation Findings: Post procedure EKG shows: sinus bradycardia Complications: None Patient did tolerate procedure well.   After cardioversion HR remained in the upper 40s to low 50s.  We will reduce metoprolol to 25mg  twice daily.   Skeet Latch, MD 04/25/2018, 9:10 AM

## 2018-04-25 NOTE — Anesthesia Procedure Notes (Signed)
Procedure Name: General with mask airway Date/Time: 04/25/2018 9:08 AM Performed by: Kyung Rudd, CRNA Pre-anesthesia Checklist: Patient identified, Emergency Drugs available, Suction available and Patient being monitored Patient Re-evaluated:Patient Re-evaluated prior to induction Oxygen Delivery Method: Ambu bag Preoxygenation: Pre-oxygenation with 100% oxygen Induction Type: IV induction Ventilation: Mask ventilation without difficulty Placement Confirmation: positive ETCO2

## 2018-04-25 NOTE — Discharge Instructions (Signed)
Electrical Cardioversion, Care After °This sheet gives you information about how to care for yourself after your procedure. Your health care provider may also give you more specific instructions. If you have problems or questions, contact your health care provider. °What can I expect after the procedure? °After the procedure, it is common to have: °· Some redness on the skin where the shocks were given. ° °Follow these instructions at home: °· Do not drive for 24 hours if you were given a medicine to help you relax (sedative). °· Take over-the-counter and prescription medicines only as told by your health care provider. °· Ask your health care provider how to check your pulse. Check it often. °· Rest for 48 hours after the procedure or as told by your health care provider. °· Avoid or limit your caffeine use as told by your health care provider. °Contact a health care provider if: °· You feel like your heart is beating too quickly or your pulse is not regular. °· You have a serious muscle cramp that does not go away. °Get help right away if: °· You have discomfort in your chest. °· You are dizzy or you feel faint. °· You have trouble breathing or you are short of breath. °· Your speech is slurred. °· You have trouble moving an arm or leg on one side of your body. °· Your fingers or toes turn cold or blue. °This information is not intended to replace advice given to you by your health care provider. Make sure you discuss any questions you have with your health care provider. °Document Released: 07/12/2013 Document Revised: 04/24/2016 Document Reviewed: 03/27/2016 °Elsevier Interactive Patient Education © 2018 Elsevier Inc. ° °

## 2018-04-27 ENCOUNTER — Other Ambulatory Visit: Payer: Self-pay | Admitting: Cardiovascular Disease

## 2018-04-27 DIAGNOSIS — H401122 Primary open-angle glaucoma, left eye, moderate stage: Secondary | ICD-10-CM | POA: Diagnosis not present

## 2018-04-27 DIAGNOSIS — H25813 Combined forms of age-related cataract, bilateral: Secondary | ICD-10-CM | POA: Diagnosis not present

## 2018-04-28 ENCOUNTER — Encounter (HOSPITAL_COMMUNITY): Payer: Self-pay | Admitting: Cardiovascular Disease

## 2018-05-12 DIAGNOSIS — M47816 Spondylosis without myelopathy or radiculopathy, lumbar region: Secondary | ICD-10-CM | POA: Diagnosis not present

## 2018-05-12 DIAGNOSIS — M4726 Other spondylosis with radiculopathy, lumbar region: Secondary | ICD-10-CM | POA: Diagnosis not present

## 2018-05-12 DIAGNOSIS — M5136 Other intervertebral disc degeneration, lumbar region: Secondary | ICD-10-CM | POA: Diagnosis not present

## 2018-05-12 DIAGNOSIS — M48062 Spinal stenosis, lumbar region with neurogenic claudication: Secondary | ICD-10-CM | POA: Diagnosis not present

## 2018-05-12 DIAGNOSIS — M5126 Other intervertebral disc displacement, lumbar region: Secondary | ICD-10-CM | POA: Diagnosis not present

## 2018-05-16 DIAGNOSIS — M4726 Other spondylosis with radiculopathy, lumbar region: Secondary | ICD-10-CM | POA: Diagnosis not present

## 2018-05-16 DIAGNOSIS — M5136 Other intervertebral disc degeneration, lumbar region: Secondary | ICD-10-CM | POA: Diagnosis not present

## 2018-05-16 DIAGNOSIS — M48062 Spinal stenosis, lumbar region with neurogenic claudication: Secondary | ICD-10-CM | POA: Diagnosis not present

## 2018-05-18 DIAGNOSIS — M5136 Other intervertebral disc degeneration, lumbar region: Secondary | ICD-10-CM | POA: Diagnosis not present

## 2018-05-18 DIAGNOSIS — M48061 Spinal stenosis, lumbar region without neurogenic claudication: Secondary | ICD-10-CM | POA: Diagnosis not present

## 2018-05-18 DIAGNOSIS — M4726 Other spondylosis with radiculopathy, lumbar region: Secondary | ICD-10-CM | POA: Diagnosis not present

## 2018-05-24 ENCOUNTER — Ambulatory Visit (INDEPENDENT_AMBULATORY_CARE_PROVIDER_SITE_OTHER): Payer: Medicare Other | Admitting: Family Medicine

## 2018-05-24 VITALS — Wt 175.6 lb

## 2018-05-24 DIAGNOSIS — D649 Anemia, unspecified: Secondary | ICD-10-CM

## 2018-05-24 DIAGNOSIS — R739 Hyperglycemia, unspecified: Secondary | ICD-10-CM | POA: Diagnosis not present

## 2018-05-24 DIAGNOSIS — I10 Essential (primary) hypertension: Secondary | ICD-10-CM

## 2018-05-24 DIAGNOSIS — I482 Chronic atrial fibrillation, unspecified: Secondary | ICD-10-CM

## 2018-05-24 DIAGNOSIS — M810 Age-related osteoporosis without current pathological fracture: Secondary | ICD-10-CM

## 2018-05-24 DIAGNOSIS — R7989 Other specified abnormal findings of blood chemistry: Secondary | ICD-10-CM

## 2018-05-24 DIAGNOSIS — E876 Hypokalemia: Secondary | ICD-10-CM

## 2018-05-24 DIAGNOSIS — I2510 Atherosclerotic heart disease of native coronary artery without angina pectoris: Secondary | ICD-10-CM

## 2018-05-24 DIAGNOSIS — E782 Mixed hyperlipidemia: Secondary | ICD-10-CM | POA: Diagnosis not present

## 2018-05-24 NOTE — Patient Instructions (Signed)
Oral Thrush, Adult Oral thrush is an infection in your mouth and throat. It causes white patches on your tongue and in your mouth. Follow these instructions at home: Helping with soreness  To lessen your pain: ? Drink cold liquids, like water and iced tea. ? Eat frozen ice pops or frozen juices. ? Eat foods that are easy to swallow, like gelatin and ice cream. ? Drink from a straw if the patches in your mouth are painful. General instructions   Take or use over-the-counter and prescription medicines only as told by your doctor. Medicine for oral thrush may be something to swallow, or it may be something to put on the infected area.  Eat plain yogurt that has live cultures in it. Read the label to make sure.  If you wear dentures: ? Take out your dentures before you go to bed. ? Brush them well. ? Soak them in a denture cleaner.  Rinse your mouth with warm salt-water many times a day. To make the salt-water mixture, completely dissolve 1/2-1 teaspoon of salt in 1 cup of warm water. Contact a doctor if:  Your problems are getting worse.  Your problems do not get better in less than 7 days with treatment.  Your infection is spreading. This may show as white patches on the skin outside of your mouth.  You are nursing your baby and you have redness and pain in the nipples. This information is not intended to replace advice given to you by your health care provider. Make sure you discuss any questions you have with your health care provider. Document Released: 12/16/2009 Document Revised: 06/15/2016 Document Reviewed: 06/15/2016 Elsevier Interactive Patient Education  2017 Elsevier Inc.  

## 2018-05-25 LAB — COMPREHENSIVE METABOLIC PANEL
ALT: 15 U/L (ref 0–53)
AST: 14 U/L (ref 0–37)
Albumin: 4.4 g/dL (ref 3.5–5.2)
Alkaline Phosphatase: 55 U/L (ref 39–117)
BUN: 30 mg/dL — ABNORMAL HIGH (ref 6–23)
CO2: 27 mEq/L (ref 19–32)
Calcium: 10.2 mg/dL (ref 8.4–10.5)
Chloride: 98 mEq/L (ref 96–112)
Creatinine, Ser: 1.34 mg/dL (ref 0.40–1.50)
GFR: 55.79 mL/min — ABNORMAL LOW (ref >60.00)
Glucose, Bld: 95 mg/dL (ref 70–99)
Potassium: 5.2 mEq/L — ABNORMAL HIGH (ref 3.5–5.1)
Sodium: 136 mEq/L (ref 135–145)
Total Bilirubin: 0.7 mg/dL (ref 0.2–1.2)
Total Protein: 7.5 g/dL (ref 6.0–8.3)

## 2018-05-25 LAB — CBC WITH DIFFERENTIAL/PLATELET
Basophils Absolute: 0.1 10*3/uL (ref 0.0–0.1)
Basophils Relative: 0.4 % (ref 0.0–3.0)
Eosinophils Absolute: 0 10*3/uL (ref 0.0–0.7)
Eosinophils Relative: 0.1 % (ref 0.0–5.0)
HCT: 43 % (ref 39.0–52.0)
Hemoglobin: 14.8 g/dL (ref 13.0–17.0)
Lymphocytes Relative: 13 % (ref 12.0–46.0)
Lymphs Abs: 1.9 10*3/uL (ref 0.7–4.0)
MCHC: 34.3 g/dL (ref 30.0–36.0)
MCV: 89.9 fl (ref 78.0–100.0)
Monocytes Absolute: 1.3 10*3/uL — ABNORMAL HIGH (ref 0.1–1.0)
Monocytes Relative: 8.8 % (ref 3.0–12.0)
Neutro Abs: 11.4 10*3/uL — ABNORMAL HIGH (ref 1.4–7.7)
Neutrophils Relative %: 77.7 % — ABNORMAL HIGH (ref 43.0–77.0)
Platelets: 247 10*3/uL (ref 150.0–400.0)
RBC: 4.79 Mil/uL (ref 4.22–5.81)
RDW: 14.1 % (ref 11.5–15.5)
WBC: 14.7 10*3/uL — ABNORMAL HIGH (ref 4.0–10.5)

## 2018-05-27 ENCOUNTER — Encounter: Payer: Self-pay | Admitting: Internal Medicine

## 2018-05-27 ENCOUNTER — Ambulatory Visit (INDEPENDENT_AMBULATORY_CARE_PROVIDER_SITE_OTHER): Payer: Medicare Other | Admitting: Internal Medicine

## 2018-05-27 VITALS — BP 134/76 | HR 51 | Ht 64.0 in | Wt 173.0 lb

## 2018-05-27 DIAGNOSIS — I10 Essential (primary) hypertension: Secondary | ICD-10-CM | POA: Diagnosis not present

## 2018-05-27 DIAGNOSIS — I480 Paroxysmal atrial fibrillation: Secondary | ICD-10-CM

## 2018-05-27 DIAGNOSIS — I5032 Chronic diastolic (congestive) heart failure: Secondary | ICD-10-CM

## 2018-05-27 DIAGNOSIS — I2510 Atherosclerotic heart disease of native coronary artery without angina pectoris: Secondary | ICD-10-CM | POA: Diagnosis not present

## 2018-05-27 NOTE — Patient Instructions (Addendum)

## 2018-05-27 NOTE — Progress Notes (Signed)
HPI Mr. Chiaramonte returns today for ongoing evaluation and management of paroxysmal atrial fibrillation, hypertension, COPD, and diastolic heart failure. In the interim, he has not had chest pain or sob or symptomatic atrial fib. He was in the hospital with atrial fib and chf a couple of months ago. He returns today for followup. He feels well overall. No syncope. He has lost 6 lbs. Allergies  Allergen Reactions  . Mucinex D [Pseudoephedrine-Guaifenesin Er] Anaphylaxis    Stomach cramps  . Polymyxin B-Trimethoprim Swelling    Eye drops made eyes swell  . Codeine Hives, Itching and Rash  . Statins Other (See Comments)    Muscle cramps   . Pseudoephedrine-Guaifenesin Nausea And Vomiting    Stomach cramps  . Tapentadol Other (See Comments)    unknown   . Ciprofloxacin Hives, Itching, Nausea Only and Rash  . Moxifloxacin Nausea Only and Other (See Comments)    Stomach cramps  . Rofecoxib     Stomach cramping      Current Outpatient Medications  Medication Sig Dispense Refill  . amoxicillin (AMOXIL) 500 MG capsule Take 2,000 mg by mouth See admin instructions. Take 2000 mg 1 hour prior to dental work    . brimonidine (ALPHAGAN) 0.2 % ophthalmic solution Instill 1 drop into the left eye twice daily  7  . Cholecalciferol (VITAMIN D3) 5000 units CAPS Take 5,000 Units by mouth every Monday, Wednesday, and Friday.    . clobetasol (TEMOVATE) 0.05 % external solution APPLY 1 APPLICATION TOPICALLY TWICE DAILY (Patient taking differently: Apply 1 application topically 2 (two) times daily as needed (psoriasis). ) 100 mL 3  . ELIQUIS 5 MG TABS tablet TAKE 1 TABLET BY MOUTH TWO  TIMES DAILY 180 tablet 1  . fluconazole (DIFLUCAN) 100 MG tablet 1 tab po daily x 3 days can repeat in 1 week 6 tablet 2  . furosemide (LASIX) 20 MG tablet Take 1 tablet (20 mg total) by mouth daily. 30 tablet 11  . latanoprost (XALATAN) 0.005 % ophthalmic solution Place 1 drop into the left eye at bedtime.     .  metoprolol tartrate (LOPRESSOR) 25 MG tablet Take 25 mg by mouth 2 (two) times daily.    . Multiple Vitamins-Minerals (ICAPS MV) TABS Take 1 tablet by mouth 2 (two) times daily.     Marland Kitchen omeprazole (PRILOSEC OTC) 20 MG tablet Take 20 mg by mouth daily.    . potassium chloride (K-DUR) 10 MEQ tablet Take 1 tablet (10 mEq total) by mouth daily. 90 tablet 3  . propafenone (RYTHMOL) 225 MG tablet Take 1 tablet (225 mg total) by mouth 2 (two) times daily. 180 tablet 3  . Ranibizumab (LUCENTIS IO) Inject 1 Dose into the eye every 3 (three) months. (Macular Degeneration)    . tamsulosin (FLOMAX) 0.4 MG CAPS capsule Take 1 capsule (0.4 mg total) by mouth daily. (Patient taking differently: Take 0.4 mg by mouth at bedtime. ) 30 capsule 0   No current facility-administered medications for this visit.      Past Medical History:  Diagnosis Date  . A-fib ([place]) 03/22/2018  . Anemia   . Atrial flutter ([place]) 03/31/2012   converted spontaneously to sinus  . CAD (coronary artery disease)    reportedly moderate CAD; managed medically  . COPD (chronic obstructive pulmonary disease) ([place])   . Eczema 08/03/2014  . Edema 03/22/2018  . GERD (gastroesophageal reflux disease)   . H/O: rheumatic fever   . Heart murmur   .  Heart murmur   . High risk medication use    on amiodarone since 03/31/2012  . History of colonoscopy   . Hyperglycemia 06/21/2017  . Hyperlipidemia   . Hypertension   . Kidney stone 06/21/2017  . Kidney stones   . Lobar pneumonia ([place]) 03/22/2018  . Low vitamin D level 06/21/2017  . Macular degeneration   . Osteoporosis 05/31/2016    ROS:   All systems reviewed and negative except as noted in the HPI.   Past Surgical History:  Procedure Laterality Date  . APPENDECTOMY    . CARDIAC CATHETERIZATION  04/22/2011   moderate left main and RCA stenosis not significant by FFR and IVUS on medical therapy  . CARDIOVERSION N/A 04/25/2018   Procedure: CARDIOVERSION;  Surgeon: Skeet Latch, MD;   Location: [facility] ENDOSCOPY;  Service: Cardiovascular;  Laterality: N/A;  . CERVICAL DISCECTOMY     C5,6,7 disc fused  with plate and 5 1" screws  . CHOLECYSTECTOMY    . FOOT SURGERY     right calcification removed from top of foot  . knee cartiledge Right    right knee  . MOUTH SURGERY     periodontal surgery, bridges, splint in front,      Family History  Problem Relation Age of Onset  . Heart disease Mother   . Diabetes Mother   . Cirrhosis Mother   . Emphysema Mother        never smoked but 2nd hand through her spouse  . Hypertension Mother   . Macular degeneration Mother   . Heart disease Father   . Cancer Father        prostate  . Hyperlipidemia Father   . Hypertension Father   . Varicose Veins Father   . Heart attack Father   . Peripheral vascular disease Father   . Heart disease Sister   . Arthritis Sister   . Hyperlipidemia Sister   . Obesity Sister   . Macular degeneration Sister   . Heart disease Brother        5 stents  . Hyperlipidemia Brother   . Macular degeneration Maternal Grandfather   . Cirrhosis Sister   . Obesity Sister   . Arthritis Sister   . Heart disease Sister   . Obesity Sister   . Liver disease Unknown   . Prostate cancer Unknown   . Coronary artery disease Unknown      Social History   Socioeconomic History  . Marital status: Married    Spouse name: Not on file  . Number of children: 0  . Years of education: Not on file  . Highest education level: Not on file  Occupational History  . Occupation: retired    Fish farm manager: RETIRED  Social Needs  . Financial resource strain: Not on file  . Food insecurity:    Worry: Not on file    Inability: Not on file  . Transportation needs:    Medical: Not on file    Non-medical: Not on file  Tobacco Use  . Smoking status: Former Smoker    Packs/day: 0.50    Years: 50.00    Pack years: 25.00    Types: Cigarettes    Last attempt to quit: 05/01/2013    Years since quitting: 5.0  . Smokeless  tobacco: Former Systems developer  . Tobacco comment: using e-Cig//ldc  Substance and Sexual Activity  . Alcohol use: No  . Drug use: No  . Sexual activity: Yes    Comment: lives with wife,  no dietary restrictions  Lifestyle  . Physical activity:    Days per week: Not on file    Minutes per session: Not on file  . Stress: Not on file  Relationships  . Social connections:    Talks on phone: Not on file    Gets together: Not on file    Attends religious service: Not on file    Active member of club or organization: Not on file    Attends meetings of clubs or organizations: Not on file    Relationship status: Not on file  . Intimate partner violence:    Fear of current or ex partner: Not on file    Emotionally abused: Not on file    Physically abused: Not on file    Forced sexual activity: Not on file  Other Topics Concern  . Not on file  Social History Narrative  . Not on file     BP 134/76   Pulse (!) 51   Ht 5\' 4"  (1.626 m)   Wt 173 lb (78.5 kg)   SpO2 98%   BMI 29.70 kg/m   Physical Exam:  Well appearing 72 yo man, NAD HEENT: Unremarkable Neck:  6 cm JVD, no thyromegally Lymphatics:  No adenopathy Back:  No CVA tenderness Lungs:  Clear with no wheezes HEART:  Regular rate rhythm, no murmurs, no rubs, no clicks Abd:  soft, positive bowel sounds, no organomegally, no rebound, no guarding Ext:  2 plus pulses, no edema, no cyanosis, no clubbing Skin:  No rashes no nodules Neuro:  CN II through XII intact, motor grossly intact  EKG - sinus bradycardia   Assess/Plan: 1. PAF - he is maintaining NSR. He will continue his rhythmol. I instructed him to take an extra dose if he has break throughs of atrial fib. 2. HTN - his blood pressure is reasonably well controlled. He will continue his current meds. 3. Diastolic heart failure - his symptoms are class 2. He will continue his current meds.   Mikle Bosworth.D.

## 2018-05-28 DIAGNOSIS — Z23 Encounter for immunization: Secondary | ICD-10-CM | POA: Diagnosis not present

## 2018-05-29 ENCOUNTER — Other Ambulatory Visit: Payer: Self-pay | Admitting: Internal Medicine

## 2018-05-29 NOTE — Assessment & Plan Note (Signed)
Well controlled, no changes to meds. Encouraged heart healthy diet such as the DASH diet and exercise as tolerated.  °

## 2018-05-29 NOTE — Assessment & Plan Note (Signed)
Encouraged to get adequate exercise, calcium and vitamin d intake 

## 2018-05-29 NOTE — Assessment & Plan Note (Signed)
encouraged heart healthy diet, avoid trans fats, minimize simple carbs and saturated fats. Increase exercise as tolerated 

## 2018-05-29 NOTE — Assessment & Plan Note (Signed)
Monitor and supplement 

## 2018-05-29 NOTE — Progress Notes (Signed)
Subjective:    Patient ID: Ryan [name], male    DOB: Feb 04, 1946, 72 y.o.   MRN: [id number]  No chief complaint on file.   HPI Patient is in today for follow up . He feels well today. He denies any recent febrile illness or acute concern. He sees cardiology in follow up later this week fo his recent hospitalization for atrial fibrillation. He denies any palpitations or sob since returning home. Denies CP/HA/congestion/fevers/GI or GU c/o. Taking meds as prescribed  Past Medical History:  Diagnosis Date  . A-fib ([place]) 03/22/2018  . Anemia   . Atrial flutter ([place]) 03/31/2012   converted spontaneously to sinus  . CAD (coronary artery disease)    reportedly moderate CAD; managed medically  . COPD (chronic obstructive pulmonary disease) ([place])   . Eczema 08/03/2014  . Edema 03/22/2018  . GERD (gastroesophageal reflux disease)   . H/O: rheumatic fever   . Heart murmur   . Heart murmur   . High risk medication use    on amiodarone since 03/31/2012  . History of colonoscopy   . Hyperglycemia 06/21/2017  . Hyperlipidemia   . Hypertension   . Kidney stone 06/21/2017  . Kidney stones   . Lobar pneumonia ([place]) 03/22/2018  . Low vitamin D level 06/21/2017  . Macular degeneration   . Osteoporosis 05/31/2016    Past Surgical History:  Procedure Laterality Date  . APPENDECTOMY    . CARDIAC CATHETERIZATION  04/22/2011   moderate left main and RCA stenosis not significant by FFR and IVUS on medical therapy  . CARDIOVERSION N/A 04/25/2018   Procedure: CARDIOVERSION;  Surgeon: Skeet Latch, MD;  Location: [facility] ENDOSCOPY;  Service: Cardiovascular;  Laterality: N/A;  . CERVICAL DISCECTOMY     C5,6,7 disc fused  with plate and 5 1" screws  . CHOLECYSTECTOMY    . FOOT SURGERY     right calcification removed from top of foot  . knee cartiledge Right    right knee  . MOUTH SURGERY     periodontal surgery, bridges, splint in front,     Family History  Problem Relation Age of Onset  . Heart  disease Mother   . Diabetes Mother   . Cirrhosis Mother   . Emphysema Mother        never smoked but 2nd hand through her spouse  . Hypertension Mother   . Macular degeneration Mother   . Heart disease Father   . Cancer Father        prostate  . Hyperlipidemia Father   . Hypertension Father   . Varicose Veins Father   . Heart attack Father   . Peripheral vascular disease Father   . Heart disease Sister   . Arthritis Sister   . Hyperlipidemia Sister   . Obesity Sister   . Macular degeneration Sister   . Heart disease Brother        5 stents  . Hyperlipidemia Brother   . Macular degeneration Maternal Grandfather   . Cirrhosis Sister   . Obesity Sister   . Arthritis Sister   . Heart disease Sister   . Obesity Sister   . Liver disease Unknown   . Prostate cancer Unknown   . Coronary artery disease Unknown     Social History   Socioeconomic History  . Marital status: Married    Spouse name: Not on file  . Number of children: 0  . Years of education: Not on file  . Highest education level:  Not on file  Occupational History  . Occupation: retired    Fish farm manager: RETIRED  Social Needs  . Financial resource strain: Not on file  . Food insecurity:    Worry: Not on file    Inability: Not on file  . Transportation needs:    Medical: Not on file    Non-medical: Not on file  Tobacco Use  . Smoking status: Former Smoker    Packs/day: 0.50    Years: 50.00    Pack years: 25.00    Types: Cigarettes    Last attempt to quit: 05/01/2013    Years since quitting: 5.0  . Smokeless tobacco: Former Systems developer  . Tobacco comment: using e-Cig//ldc  Substance and Sexual Activity  . Alcohol use: No  . Drug use: No  . Sexual activity: Yes    Comment: lives with wife, no dietary restrictions  Lifestyle  . Physical activity:    Days per week: Not on file    Minutes per session: Not on file  . Stress: Not on file  Relationships  . Social connections:    Talks on phone: Not on file     Gets together: Not on file    Attends religious service: Not on file    Active member of club or organization: Not on file    Attends meetings of clubs or organizations: Not on file    Relationship status: Not on file  . Intimate partner violence:    Fear of current or ex partner: Not on file    Emotionally abused: Not on file    Physically abused: Not on file    Forced sexual activity: Not on file  Other Topics Concern  . Not on file  Social History Narrative  . Not on file    Outpatient Medications Prior to Visit  Medication Sig Dispense Refill  . amoxicillin (AMOXIL) 500 MG capsule Take 2,000 mg by mouth See admin instructions. Take 2000 mg 1 hour prior to dental work    . brimonidine (ALPHAGAN) 0.2 % ophthalmic solution Instill 1 drop into the left eye twice daily  7  . Cholecalciferol (VITAMIN D3) 5000 units CAPS Take 5,000 Units by mouth every Monday, Wednesday, and Friday.    . clobetasol (TEMOVATE) 0.05 % external solution APPLY 1 APPLICATION TOPICALLY TWICE DAILY (Patient taking differently: Apply 1 application topically 2 (two) times daily as needed (psoriasis). ) 100 mL 3  . ELIQUIS 5 MG TABS tablet TAKE 1 TABLET BY MOUTH TWO  TIMES DAILY 180 tablet 1  . fluconazole (DIFLUCAN) 100 MG tablet 1 tab po daily x 3 days can repeat in 1 week 6 tablet 2  . furosemide (LASIX) 20 MG tablet Take 1 tablet (20 mg total) by mouth daily. 30 tablet 11  . latanoprost (XALATAN) 0.005 % ophthalmic solution Place 1 drop into the left eye at bedtime.     . Multiple Vitamins-Minerals (ICAPS MV) TABS Take 1 tablet by mouth 2 (two) times daily.     Marland Kitchen omeprazole (PRILOSEC OTC) 20 MG tablet Take 20 mg by mouth daily.    . potassium chloride (K-DUR) 10 MEQ tablet Take 1 tablet (10 mEq total) by mouth daily. 90 tablet 3  . propafenone (RYTHMOL) 225 MG tablet Take 1 tablet (225 mg total) by mouth 2 (two) times daily. 180 tablet 3  . Ranibizumab (LUCENTIS IO) Inject 1 Dose into the eye every 3 (three)  months. (Macular Degeneration)    . tamsulosin (FLOMAX) 0.4 MG CAPS capsule Take  1 capsule (0.4 mg total) by mouth daily. (Patient taking differently: Take 0.4 mg by mouth at bedtime. ) 30 capsule 0  . metoprolol tartrate (LOPRESSOR) 25 MG tablet TAKE 1&1/2 TABLETS BY MOUTH TWICE DAILY (Patient not taking: Reported on 05/27/2018) 270 tablet 3   No facility-administered medications prior to visit.     Allergies  Allergen Reactions  . Mucinex D [Pseudoephedrine-Guaifenesin Er] Anaphylaxis    Stomach cramps  . Polymyxin B-Trimethoprim Swelling    Eye drops made eyes swell  . Codeine Hives, Itching and Rash  . Statins Other (See Comments)    Muscle cramps   . Pseudoephedrine-Guaifenesin Nausea And Vomiting    Stomach cramps  . Tapentadol Other (See Comments)    unknown   . Ciprofloxacin Hives, Itching, Nausea Only and Rash  . Moxifloxacin Nausea Only and Other (See Comments)    Stomach cramps  . Rofecoxib     Stomach cramping     Review of Systems  Constitutional: Negative for fever and malaise/fatigue.  HENT: Negative for congestion.   Eyes: Negative for blurred vision.  Respiratory: Negative for shortness of breath.   Cardiovascular: Negative for chest pain, palpitations and leg swelling.  Gastrointestinal: Negative for abdominal pain, blood in stool and nausea.  Genitourinary: Negative for dysuria and frequency.  Musculoskeletal: Negative for falls.  Skin: Negative for rash.  Neurological: Negative for dizziness, loss of consciousness and headaches.  Endo/Heme/Allergies: Negative for environmental allergies.  Psychiatric/Behavioral: Negative for depression. The patient is not nervous/anxious.        Objective:    Physical Exam  Constitutional: He is oriented to person, place, and time. He appears well-developed and well-nourished. No distress.  HENT:  Head: Normocephalic and atraumatic.  Nose: Nose normal.  Eyes: Right eye exhibits no discharge. Left eye exhibits no  discharge.  Neck: Normal range of motion. Neck supple.  Cardiovascular:  No murmur heard. bradycardia  Pulmonary/Chest: Effort normal and breath sounds normal.  Abdominal: Soft. Bowel sounds are normal. There is no tenderness.  Musculoskeletal: He exhibits no edema.  Neurological: He is alert and oriented to person, place, and time.  Skin: Skin is warm and dry.  Psychiatric: He has a normal mood and affect.  Nursing note and vitals reviewed.   Wt 175 lb 9.6 oz (79.7 kg)   BMI 29.22 kg/m  Wt Readings from Last 3 Encounters:  05/27/18 173 lb (78.5 kg)  05/24/18 175 lb 9.6 oz (79.7 kg)  04/19/18 176 lb (79.8 kg)     Lab Results  Component Value Date   WBC 14.7 (H) 05/24/2018   HGB 14.8 05/24/2018   HCT 43.0 05/24/2018   PLT 247.0 05/24/2018   GLUCOSE 95 05/24/2018   CHOL 222 (H) 04/19/2018   TRIG 81.0 04/19/2018   HDL 48.00 04/19/2018   LDLDIRECT 134.2 12/01/2013   LDLCALC 158 (H) 04/19/2018   ALT 15 05/24/2018   AST 14 05/24/2018   NA 136 05/24/2018   K 5.2 (H) 05/24/2018   CL 98 05/24/2018   CREATININE 1.34 05/24/2018   BUN 30 (H) 05/24/2018   CO2 27 05/24/2018   TSH 2.13 04/19/2018   PSA 1.64 01/14/2015   INR 1.17 04/01/2012   HGBA1C 5.4 04/19/2018    Lab Results  Component Value Date   TSH 2.13 04/19/2018   Lab Results  Component Value Date   WBC 14.7 (H) 05/24/2018   HGB 14.8 05/24/2018   HCT 43.0 05/24/2018   MCV 89.9 05/24/2018   PLT 247.0 05/24/2018  Lab Results  Component Value Date   NA 136 05/24/2018   K 5.2 (H) 05/24/2018   CO2 27 05/24/2018   GLUCOSE 95 05/24/2018   BUN 30 (H) 05/24/2018   CREATININE 1.34 05/24/2018   BILITOT 0.7 05/24/2018   ALKPHOS 55 05/24/2018   AST 14 05/24/2018   ALT 15 05/24/2018   PROT 7.5 05/24/2018   ALBUMIN 4.4 05/24/2018   CALCIUM 10.2 05/24/2018   ANIONGAP 14 03/25/2018   GFR 55.79 (L) 05/24/2018   Lab Results  Component Value Date   CHOL 222 (H) 04/19/2018   Lab Results  Component Value Date    HDL 48.00 04/19/2018   Lab Results  Component Value Date   LDLCALC 158 (H) 04/19/2018   Lab Results  Component Value Date   TRIG 81.0 04/19/2018   Lab Results  Component Value Date   CHOLHDL 5 04/19/2018   Lab Results  Component Value Date   HGBA1C 5.4 04/19/2018       Assessment & Plan:   Problem List Items Addressed This Visit    Hyperlipidemia, mixed    encouraged heart healthy diet, avoid trans fats, minimize simple carbs and saturated fats. Increase exercise as tolerated      Essential hypertension    Well controlled, no changes to meds. Encouraged heart healthy diet such as the DASH diet and exercise as tolerated.       Osteoporosis    Encouraged to get adequate exercise, calcium and vitamin d intake      Low vitamin D level    Monitor and supplement      Hyperglycemia    hgba1c acceptable, minimize simple carbs. Increase exercise as tolerated.      A-fib [facility])    Sees cardiology later this week. Feeling much better since hospitalization.        Other Visit Diagnoses    Hypokalemia    -  Primary   Relevant Orders   Comprehensive metabolic panel (Completed)   Anemia, unspecified type       Relevant Orders   CBC w/Diff (Completed)      I am having Stevon L. Carlota Raspberry "L" maintain his ICAPS MV, omeprazole, Ranibizumab (LUCENTIS IO), latanoprost, clobetasol, tamsulosin, propafenone, brimonidine, ELIQUIS, furosemide, potassium chloride, amoxicillin, Vitamin D3, and fluconazole.  No orders of the defined types were placed in this encounter.    Penni Homans, MD

## 2018-05-29 NOTE — Assessment & Plan Note (Signed)
hgba1c acceptable, minimize simple carbs. Increase exercise as tolerated.  

## 2018-05-29 NOTE — Assessment & Plan Note (Signed)
Sees cardiology later this week. Feeling much better since hospitalization.

## 2018-06-14 ENCOUNTER — Other Ambulatory Visit: Payer: Self-pay | Admitting: Internal Medicine

## 2018-06-14 MED ORDER — FUROSEMIDE 20 MG PO TABS: 20.0000 mg | ORAL_TABLET | Freq: Every day | ORAL | 2 refills | Status: DC

## 2018-06-22 ENCOUNTER — Ambulatory Visit: Payer: Medicare Other | Admitting: *Deleted

## 2018-06-23 ENCOUNTER — Encounter: Payer: Self-pay | Admitting: Family Medicine

## 2018-06-23 DIAGNOSIS — H401124 Primary open-angle glaucoma, left eye, indeterminate stage: Secondary | ICD-10-CM | POA: Diagnosis not present

## 2018-06-23 DIAGNOSIS — H353110 Nonexudative age-related macular degeneration, right eye, stage unspecified: Secondary | ICD-10-CM | POA: Diagnosis not present

## 2018-06-23 DIAGNOSIS — H25813 Combined forms of age-related cataract, bilateral: Secondary | ICD-10-CM | POA: Diagnosis not present

## 2018-06-23 DIAGNOSIS — H353221 Exudative age-related macular degeneration, left eye, with active choroidal neovascularization: Secondary | ICD-10-CM | POA: Diagnosis not present

## 2018-06-28 ENCOUNTER — Ambulatory Visit: Payer: Medicare Other | Admitting: *Deleted

## 2018-07-07 DIAGNOSIS — H35363 Drusen (degenerative) of macula, bilateral: Secondary | ICD-10-CM | POA: Diagnosis not present

## 2018-07-07 DIAGNOSIS — H353111 Nonexudative age-related macular degeneration, right eye, early dry stage: Secondary | ICD-10-CM | POA: Diagnosis not present

## 2018-07-07 DIAGNOSIS — H353221 Exudative age-related macular degeneration, left eye, with active choroidal neovascularization: Secondary | ICD-10-CM | POA: Diagnosis not present

## 2018-07-07 DIAGNOSIS — H3554 Dystrophies primarily involving the retinal pigment epithelium: Secondary | ICD-10-CM | POA: Diagnosis not present

## 2018-07-18 ENCOUNTER — Other Ambulatory Visit: Payer: Self-pay | Admitting: Internal Medicine

## 2018-07-18 MED ORDER — POTASSIUM CHLORIDE ER 10 MEQ PO TBCR: 10.0000 meq | EXTENDED_RELEASE_TABLET | Freq: Every day | ORAL | 3 refills | Status: DC

## 2018-07-25 DIAGNOSIS — Z86010 Personal history of colonic polyps: Secondary | ICD-10-CM | POA: Diagnosis not present

## 2018-07-25 DIAGNOSIS — K5900 Constipation, unspecified: Secondary | ICD-10-CM | POA: Diagnosis not present

## 2018-07-25 DIAGNOSIS — K219 Gastro-esophageal reflux disease without esophagitis: Secondary | ICD-10-CM | POA: Diagnosis not present

## 2018-07-26 DIAGNOSIS — Z23 Encounter for immunization: Secondary | ICD-10-CM | POA: Diagnosis not present

## 2018-07-26 DIAGNOSIS — L821 Other seborrheic keratosis: Secondary | ICD-10-CM | POA: Diagnosis not present

## 2018-07-26 DIAGNOSIS — L820 Inflamed seborrheic keratosis: Secondary | ICD-10-CM | POA: Diagnosis not present

## 2018-07-26 DIAGNOSIS — L719 Rosacea, unspecified: Secondary | ICD-10-CM | POA: Diagnosis not present

## 2018-07-26 DIAGNOSIS — D225 Melanocytic nevi of trunk: Secondary | ICD-10-CM | POA: Diagnosis not present

## 2018-07-26 DIAGNOSIS — L814 Other melanin hyperpigmentation: Secondary | ICD-10-CM | POA: Diagnosis not present

## 2018-07-27 NOTE — Progress Notes (Addendum)
Subjective:   Ryan [name] is a 72 y.o. male who presents for Medicare Annual (Subsequent) preventive examination.  Review of Systems: No ROS.  Medicare Wellness Visit. Additional risk factors are reflected in the social history. Cardiac Risk Factors include: advanced age (>67men, >45 women);dyslipidemia;male gender;hypertension Sleep patterns: Sleeps 7 hrs at night. Naps 1 hr at lunch. Home Safety/Smoke Alarms: Feels safe in home. Smoke alarms in place.   Male:   CCS-last 10/28/17. Recall 3 yrs   PSA-  Lab Results  Component Value Date   PSA 1.64 01/14/2015   PSA 1.37 12/01/2013   PSA 0.86 05/13/2012       Objective:     Vitals: BP (!) 142/72 (BP Location: Left Arm, Patient Position: Sitting, Cuff Size: Normal) Comment: all vitals done @830  in PCP appt by P.Carter CMA  Pulse 61   Ht 5\' 4"  (1.626 m)   Wt 176 lb 6.4 oz (80 kg)   SpO2 98%   BMI 30.28 kg/m   Body mass index is 30.28 kg/m.  Advanced Directives 07/28/2018 04/25/2018 03/22/2018 10/11/2017 08/07/2017 07/25/2017 06/21/2017  Does Patient Have a Medical Advance Directive? Yes No No No;Yes Yes Yes Yes  Type of Paramedic of [place];Living will - - [place];Living will [place];Living will [place];Living will [place];Living will  Does patient want to make changes to medical advance directive? - - - - - - -  Copy of [place] in Chart? Yes - - - - No - copy requested Yes  Would patient like information on creating a medical advance directive? - No - Patient declined No - Patient declined - - - -  Pre-existing out of facility DNR order (yellow form or pink MOST form) - - - - - - -    Tobacco Social History   Tobacco Use  Smoking Status Former Smoker  . Packs/day: 0.50  . Years: 50.00  . Pack years: 25.00  . Types: Cigarettes  . Last attempt to quit: 05/01/2013  . Years since quitting: 5.2    Smokeless Tobacco Former Systems developer  Tobacco Comment   using e-Cig//ldc     Counseling given: Not Answered Comment: using e-Cig//ldc   Clinical Intake: Pain : No/denies pain   Past Medical History:  Diagnosis Date  . A-fib ([place]) 03/22/2018  . Anemia   . Atrial flutter ([place]) 03/31/2012   converted spontaneously to sinus  . CAD (coronary artery disease)    reportedly moderate CAD; managed medically  . COPD (chronic obstructive pulmonary disease) ([place])   . Eczema 08/03/2014  . Edema 03/22/2018  . GERD (gastroesophageal reflux disease)   . H/O: rheumatic fever   . Heart murmur   . Heart murmur   . High risk medication use    on amiodarone since 03/31/2012  . History of colonoscopy   . Hyperglycemia 06/21/2017  . Hyperlipidemia   . Hypertension   . Kidney stone 06/21/2017  . Kidney stones   . Lobar pneumonia ([place]) 03/22/2018  . Low vitamin D level 06/21/2017  . Macular degeneration   . Osteoporosis 05/31/2016   Past Surgical History:  Procedure Laterality Date  . APPENDECTOMY    . CARDIAC CATHETERIZATION  04/22/2011   moderate left main and RCA stenosis not significant by FFR and IVUS on medical therapy  . CARDIOVERSION N/A 04/25/2018   Procedure: CARDIOVERSION;  Surgeon: Skeet Latch, MD;  Location: [facility];  Service: Cardiovascular;  Laterality: N/A;  . CERVICAL DISCECTOMY     C5,6,7 disc fused  with plate and 5 1" screws  . CHOLECYSTECTOMY    . colon polyp removal  3/1.19  . FOOT SURGERY     right calcification removed from top of foot  . knee cartiledge Right    right knee  . MOUTH SURGERY     periodontal surgery, bridges, splint in front,    Family History  Problem Relation Age of Onset  . Heart disease Mother   . Diabetes Mother   . Cirrhosis Mother   . Emphysema Mother        never smoked but 2nd hand through her spouse  . Hypertension Mother   . Macular degeneration Mother   . Heart disease Father   . Cancer Father        prostate  . Hyperlipidemia  Father   . Hypertension Father   . Varicose Veins Father   . Heart attack Father   . Peripheral vascular disease Father   . Heart disease Sister   . Arthritis Sister   . Hyperlipidemia Sister   . Obesity Sister   . Macular degeneration Sister   . Heart disease Brother        5 stents  . Hyperlipidemia Brother   . Macular degeneration Maternal Grandfather   . Cirrhosis Sister   . Obesity Sister   . Arthritis Sister   . Heart disease Sister   . Obesity Sister   . Liver disease Unknown   . Prostate cancer Unknown   . Coronary artery disease Unknown    Social History   Socioeconomic History  . Marital status: Married    Spouse name: Not on file  . Number of children: 0  . Years of education: Not on file  . Highest education level: Not on file  Occupational History  . Occupation: retired    Fish farm manager: RETIRED  Social Needs  . Financial resource strain: Not on file  . Food insecurity:    Worry: Not on file    Inability: Not on file  . Transportation needs:    Medical: Not on file    Non-medical: Not on file  Tobacco Use  . Smoking status: Former Smoker    Packs/day: 0.50    Years: 50.00    Pack years: 25.00    Types: Cigarettes    Last attempt to quit: 05/01/2013    Years since quitting: 5.2  . Smokeless tobacco: Former Systems developer  . Tobacco comment: using e-Cig//ldc  Substance and Sexual Activity  . Alcohol use: No  . Drug use: No  . Sexual activity: Yes    Comment: lives with wife, no dietary restrictions  Lifestyle  . Physical activity:    Days per week: Not on file    Minutes per session: Not on file  . Stress: Not on file  Relationships  . Social connections:    Talks on phone: Not on file    Gets together: Not on file    Attends religious service: Not on file    Active member of club or organization: Not on file    Attends meetings of clubs or organizations: Not on file    Relationship status: Not on file  Other Topics Concern  . Not on file  Social  History Narrative  . Not on file    Outpatient Encounter Medications as of 07/28/2018  Medication Sig  . amoxicillin (AMOXIL) 500 MG capsule Take 2,000 mg by mouth  See admin instructions. Take 2000 mg 1 hour prior to dental work  . brimonidine (ALPHAGAN) 0.2 % ophthalmic solution Instill 1 drop into the left eye twice daily  . Cholecalciferol (VITAMIN D3) 5000 units CAPS Take 5,000 Units by mouth every Monday, Wednesday, and Friday.  . clobetasol (TEMOVATE) 0.05 % external solution APPLY 1 APPLICATION TOPICALLY TWICE DAILY (Patient taking differently: Apply 1 application topically 2 (two) times daily as needed (psoriasis). )  . ELIQUIS 5 MG TABS tablet TAKE 1 TABLET BY MOUTH TWO  TIMES DAILY  . fluconazole (DIFLUCAN) 100 MG tablet 1 tab po daily x 3 days can repeat in 1 week  . furosemide (LASIX) 20 MG tablet Take 1 tablet (20 mg total) by mouth daily as needed for edema.  Marland Kitchen latanoprost (XALATAN) 0.005 % ophthalmic solution Place 1 drop into the left eye at bedtime.   . metoprolol tartrate (LOPRESSOR) 25 MG tablet Take 1 tablet (25 mg total) by mouth 2 (two) times daily.  . Multiple Vitamins-Minerals (ICAPS MV) TABS Take 1 tablet by mouth 2 (two) times daily.   Marland Kitchen omeprazole (PRILOSEC OTC) 20 MG tablet Take 20 mg by mouth daily.  . potassium chloride (K-DUR) 10 MEQ tablet Take 1 tablet (10 mEq total) by mouth daily as needed.  . propafenone (RYTHMOL) 225 MG tablet Take 1 tablet (225 mg total) by mouth 2 (two) times daily.  . Ranibizumab (LUCENTIS IO) Inject 1 Dose into the eye every 3 (three) months. (Macular Degeneration)  . tamsulosin (FLOMAX) 0.4 MG CAPS capsule Take 1 capsule (0.4 mg total) by mouth daily. (Patient taking differently: Take 0.4 mg by mouth at bedtime. )  . [DISCONTINUED] furosemide (LASIX) 20 MG tablet Take 1 tablet (20 mg total) by mouth daily.  . [DISCONTINUED] potassium chloride (K-DUR) 10 MEQ tablet Take 1 tablet (10 mEq total) by mouth daily.   No facility-administered  encounter medications on file as of 07/28/2018.     Activities of Daily Living In your present state of health, do you have any difficulty performing the following activities: 07/28/2018 03/22/2018  Hearing? N N  Vision? N N  Difficulty concentrating or making decisions? N N  Walking or climbing stairs? N Y  Comment - secondary to right leg swelling and pain  Dressing or bathing? N N  Doing errands, shopping? N N  Preparing Food and eating ? N -  Using the Toilet? N -  In the past six months, have you accidently leaked urine? N -  Do you have problems with loss of bowel control? N -  Managing your Medications? N -  Managing your Finances? N -  Housekeeping or managing your Housekeeping? N -  Some recent data might be hidden    Patient Care Team: [name], MD as PCP - General (Family Medicine) Evans Lance, MD as PCP - Cardiology (Cardiology) Jovita Gamma, MD as Consulting Physician (Neurosurgery) Evans Lance, MD as Consulting Physician (Cardiology) Gerarda Fraction, MD as Referring Physician (Ophthalmology) Richmond Campbell, MD as Consulting Physician (Gastroenterology)    Assessment:   This is a routine wellness examination for [place]. Physical assessment deferred to PCP.  Exercise Activities and Dietary recommendations Current Exercise Habits: Home exercise routine, Type of exercise: walking, Time (Minutes): 45, Frequency (Times/Week): 3, Weekly Exercise (Minutes/Week): 135, Intensity: Mild, Exercise limited by: None identified   Diet (meal preparation, eat out, water intake, caffeinated beverages, dairy products, fruits and vegetables): in general, a "healthy" diet  , well balanced   Goals    .  Patient Stated     Maintain current healthy lifestyle.       Fall Risk Fall Risk  07/28/2018 06/21/2017 06/01/2016 05/19/2016 01/14/2015  Falls in the past year? No No Yes No No  Comment - - Emmi Telephone Survey: data to providers prior to load - -  Number falls in  past yr: - - 1 - -  Comment - - Emmi Telephone Survey Actual Response = 1 - -  Injury with Fall? - - No - -    Depression Screen PHQ 2/9 Scores 07/28/2018 06/21/2017 05/19/2016 01/14/2015  PHQ - 2 Score 0 0 0 0  PHQ- 9 Score - - - -     Cognitive Function Ad8 score reviewed for issues:  Issues making decisions:no   Less interest in hobbies / activities:no  Repeats questions, stories (family complaining):no  Trouble using ordinary gadgets (microwave, computer, phone):no  Forgets the month or year: no  Mismanaging finances: no  Remembering appts:no  Daily problems with thinking and/or memory:no Ad8 score is=0        Immunization History  Administered Date(s) Administered  . Influenza Split 07/06/2011, 06/22/2013  . Influenza Whole 10/05/2000, 07/18/2009, 06/20/2010, 07/05/2012  . Influenza, High Dose Seasonal PF 06/23/2016, 06/21/2017  . Influenza,inj,Quad PF,6+ Mos 06/25/2014  . Influenza-Unspecified 05/28/2018  . Pneumococcal Conjugate-13 12/01/2013  . Pneumococcal Polysaccharide-23 07/06/2007, 06/23/2016  . Td 10/05/2002  . Tdap 05/06/2012  . Zoster 03/27/2009  . Zoster Recombinat (Shingrix) 06/28/2018    Screening Tests Health Maintenance  Topic Date Due  . TETANUS/TDAP  05/06/2022  . COLONOSCOPY  10/29/2027  . INFLUENZA VACCINE  Completed  . Hepatitis C Screening  Completed  . PNA vac Low Risk Adult  Completed       Plan:    Please schedule your next medicare wellness visit with me in 1 yr.  Continue to eat heart healthy diet (full of fruits, vegetables, whole grains, lean protein, water--limit salt, fat, and sugar intake) and increase physical activity as tolerated.    I have personally reviewed and noted the following in the patient's chart:   . Medical and social history . Use of alcohol, tobacco or illicit drugs  . Current medications and supplements . Functional ability and status . Nutritional status . Physical activity . Advanced  directives . List of other physicians . Hospitalizations, surgeries, and ER visits in previous 12 months . Vitals . Screenings to include cognitive, depression, and falls . Referrals and appointments  In addition, I have reviewed and discussed with patient certain preventive protocols, quality metrics, and best practice recommendations. A written personalized care plan for preventive services as well as general preventive health recommendations were provided to patient.     Shela Nevin, [place]  07/28/2018   Medical screening examination/treatment was performed by qualified clinical staff member and as supervising physician I was immediately available for consultation/collaboration. I have reviewed documentation and agree with assessment and plan.  Penni Homans, MD

## 2018-07-28 ENCOUNTER — Ambulatory Visit (INDEPENDENT_AMBULATORY_CARE_PROVIDER_SITE_OTHER): Payer: Medicare Other | Admitting: *Deleted

## 2018-07-28 ENCOUNTER — Ambulatory Visit (INDEPENDENT_AMBULATORY_CARE_PROVIDER_SITE_OTHER): Payer: Medicare Other | Admitting: Family Medicine

## 2018-07-28 ENCOUNTER — Encounter: Payer: Self-pay | Admitting: *Deleted

## 2018-07-28 VITALS — BP 142/72 | HR 61 | Ht 64.0 in | Wt 176.4 lb

## 2018-07-28 VITALS — BP 142/72 | HR 61 | Temp 97.7°F | Resp 18 | Wt 176.4 lb

## 2018-07-28 DIAGNOSIS — I2510 Atherosclerotic heart disease of native coronary artery without angina pectoris: Secondary | ICD-10-CM

## 2018-07-28 DIAGNOSIS — R7989 Other specified abnormal findings of blood chemistry: Secondary | ICD-10-CM

## 2018-07-28 DIAGNOSIS — Z0000 Encounter for general adult medical examination without abnormal findings: Secondary | ICD-10-CM | POA: Diagnosis not present

## 2018-07-28 DIAGNOSIS — R739 Hyperglycemia, unspecified: Secondary | ICD-10-CM | POA: Diagnosis not present

## 2018-07-28 DIAGNOSIS — E559 Vitamin D deficiency, unspecified: Secondary | ICD-10-CM

## 2018-07-28 DIAGNOSIS — I5031 Acute diastolic (congestive) heart failure: Secondary | ICD-10-CM | POA: Diagnosis not present

## 2018-07-28 DIAGNOSIS — D72829 Elevated white blood cell count, unspecified: Secondary | ICD-10-CM | POA: Diagnosis not present

## 2018-07-28 DIAGNOSIS — I10 Essential (primary) hypertension: Secondary | ICD-10-CM | POA: Diagnosis not present

## 2018-07-28 DIAGNOSIS — K625 Hemorrhage of anus and rectum: Secondary | ICD-10-CM

## 2018-07-28 DIAGNOSIS — E782 Mixed hyperlipidemia: Secondary | ICD-10-CM

## 2018-07-28 LAB — CBC WITH DIFFERENTIAL/PLATELET
Basophils Absolute: 0.1 10*3/uL (ref 0.0–0.1)
Basophils Relative: 0.6 % (ref 0.0–3.0)
Eosinophils Absolute: 0.1 10*3/uL (ref 0.0–0.7)
Eosinophils Relative: 1.2 % (ref 0.0–5.0)
HCT: 36.8 % — ABNORMAL LOW (ref 39.0–52.0)
Hemoglobin: 12.7 g/dL — ABNORMAL LOW (ref 13.0–17.0)
Lymphocytes Relative: 24.4 % (ref 12.0–46.0)
Lymphs Abs: 2.1 10*3/uL (ref 0.7–4.0)
MCHC: 34.6 g/dL (ref 30.0–36.0)
MCV: 90.5 fl (ref 78.0–100.0)
Monocytes Absolute: 0.8 10*3/uL (ref 0.1–1.0)
Monocytes Relative: 9.1 % (ref 3.0–12.0)
Neutro Abs: 5.5 10*3/uL (ref 1.4–7.7)
Neutrophils Relative %: 64.7 % (ref 43.0–77.0)
Platelets: 221 10*3/uL (ref 150.0–400.0)
RBC: 4.06 Mil/uL — ABNORMAL LOW (ref 4.22–5.81)
RDW: 14.3 % (ref 11.5–15.5)
WBC: 8.5 10*3/uL (ref 4.0–10.5)

## 2018-07-28 LAB — COMPREHENSIVE METABOLIC PANEL
ALT: 11 U/L (ref 0–53)
AST: 15 U/L (ref 0–37)
Albumin: 3.8 g/dL (ref 3.5–5.2)
Alkaline Phosphatase: 52 U/L (ref 39–117)
BUN: 11 mg/dL (ref 6–23)
CO2: 29 mEq/L (ref 19–32)
Calcium: 7 mg/dL — ABNORMAL LOW (ref 8.4–10.5)
Chloride: 103 mEq/L (ref 96–112)
Creatinine, Ser: 1.01 mg/dL (ref 0.40–1.50)
GFR: 77.28 mL/min (ref >60.00)
Glucose, Bld: 81 mg/dL (ref 70–99)
Potassium: 3.7 mEq/L (ref 3.5–5.1)
Sodium: 141 mEq/L (ref 135–145)
Total Bilirubin: 0.8 mg/dL (ref 0.2–1.2)
Total Protein: 6.5 g/dL (ref 6.0–8.3)

## 2018-07-28 LAB — VITAMIN D 25 HYDROXY (VIT D DEFICIENCY, FRACTURES): VITD: 42.87 ng/mL (ref 30.00–100.00)

## 2018-07-28 LAB — LIPID PANEL
Cholesterol: 230 mg/dL — ABNORMAL HIGH (ref 0–200)
HDL: 39.1 mg/dL (ref >39.00)
LDL Cholesterol: 174 mg/dL — ABNORMAL HIGH (ref 0–99)
NonHDL: 191.3
Total CHOL/HDL Ratio: 6
Triglycerides: 88 mg/dL (ref 0.0–149.0)
VLDL: 17.6 mg/dL (ref 0.0–40.0)

## 2018-07-28 LAB — HEMOGLOBIN A1C: Hgb A1c MFr Bld: 5.3 % (ref 4.6–6.5)

## 2018-07-28 LAB — TSH: TSH: 2.47 u[IU]/mL (ref 0.35–4.50)

## 2018-07-28 MED ORDER — POTASSIUM CHLORIDE ER 10 MEQ PO TBCR: 10.0000 meq | EXTENDED_RELEASE_TABLET | Freq: Every day | ORAL | 0 refills | Status: DC | PRN

## 2018-07-28 MED ORDER — FUROSEMIDE 20 MG PO TABS: 20.0000 mg | ORAL_TABLET | Freq: Every day | ORAL | 2 refills | Status: DC | PRN

## 2018-07-28 NOTE — Assessment & Plan Note (Signed)
Supplement and monitor 

## 2018-07-28 NOTE — Patient Instructions (Signed)
Colon Polyps Polyps are tissue growths inside the body. Polyps can grow in many places, including the large intestine (colon). A polyp may be a round bump or a mushroom-shaped growth. You could have one polyp or several. Most colon polyps are noncancerous (benign). However, some colon polyps can become cancerous over time. What are the causes? The exact cause of colon polyps is not known. What increases the risk? This condition is more likely to develop in people who:  Have a family history of colon cancer or colon polyps.  Are older than 55 or older than 45 if they are African American.  Have inflammatory bowel disease, such as ulcerative colitis or Crohn disease.  Are overweight.  Smoke cigarettes.  Do not get enough exercise.  Drink too much alcohol.  Eat a diet that is: ? High in fat and red meat. ? Low in fiber.  Had childhood cancer that was treated with abdominal radiation.  What are the signs or symptoms? Most polyps do not cause symptoms. If you have symptoms, they may include:  Blood coming from your rectum when having a bowel movement.  Blood in your stool.The stool may look dark red or black.  A change in bowel habits, such as constipation or diarrhea.  How is this diagnosed? This condition is diagnosed with a colonoscopy. This is a procedure that uses a lighted, flexible scope to look at the inside of your colon. How is this treated? Treatment for this condition involves removing any polyps that are found. Those polyps will then be tested for cancer. If cancer is found, your health care provider will talk to you about options for colon cancer treatment. Follow these instructions at home: Diet  Eat plenty of fiber, such as fruits, vegetables, and whole grains.  Eat foods that are high in calcium and vitamin D, such as milk, cheese, yogurt, eggs, liver, fish, and broccoli.  Limit foods high in fat, red meats, and processed meats, such as hot dogs, sausage,  bacon, and lunch meats.  Maintain a healthy weight, or lose weight if recommended by your health care provider. General instructions  Do not smoke cigarettes.  Do not drink alcohol excessively.  Keep all follow-up visits as told by your health care provider. This is important. This includes keeping regularly scheduled colonoscopies. Talk to your health care provider about when you need a colonoscopy.  Exercise every day or as told by your health care provider. Contact a health care provider if:  You have new or worsening bleeding during a bowel movement.  You have new or increased blood in your stool.  You have a change in bowel habits.  You unexpectedly lose weight. This information is not intended to replace advice given to you by your health care provider. Make sure you discuss any questions you have with your health care provider. Document Released: 06/17/2004 Document Revised: 02/27/2016 Document Reviewed: 08/12/2015 Elsevier Interactive Patient Education  [employer].

## 2018-07-28 NOTE — Assessment & Plan Note (Signed)
Well controlled, no changes to meds. Encouraged heart healthy diet such as the DASH diet and exercise as tolerated.  °

## 2018-07-28 NOTE — Patient Instructions (Signed)
Please schedule your next medicare wellness visit with me in 1 yr.  Continue to eat heart healthy diet (full of fruits, vegetables, whole grains, lean protein, water--limit salt, fat, and sugar intake) and increase physical activity as tolerated.   Ryan [name] , Thank you for taking time to come for your Medicare Wellness Visit. I appreciate your ongoing commitment to your health goals. Please review the following plan we discussed and let me know if I can assist you in the future.   These are the goals we discussed: Goals    . Patient Stated     Maintain current healthy lifestyle.       This is a list of the screening recommended for you and due dates:  Health Maintenance  Topic Date Due  . Tetanus Vaccine  05/06/2022  . Colon Cancer Screening  10/29/2027  . Flu Shot  Completed  .  Hepatitis C: One time screening is recommended by Center for Disease Control  (CDC) for  adults born from [birth year] through 1964.   Completed  . Pneumonia vaccines  Completed    Health Maintenance, Male A healthy lifestyle and preventive care is important for your health and wellness. Ask your health care provider about what schedule of regular examinations is right for you. What should I know about weight and diet? Eat a Healthy Diet  Eat plenty of vegetables, fruits, whole grains, low-fat dairy products, and lean protein.  Do not eat a lot of foods high in solid fats, added sugars, or salt.  Maintain a Healthy Weight Regular exercise can help you achieve or maintain a healthy weight. You should:  Do at least 150 minutes of exercise each week. The exercise should increase your heart rate and make you sweat (moderate-intensity exercise).  Do strength-training exercises at least twice a week.  Watch Your Levels of Cholesterol and Blood Lipids  Have your blood tested for lipids and cholesterol every 5 years starting at 71 years of age. If you are at high risk for heart disease, you should start having  your blood tested when you are 71 years old. You may need to have your cholesterol levels checked more often if: ? Your lipid or cholesterol levels are high. ? You are older than 71 years of age. ? You are at high risk for heart disease.  What should I know about cancer screening? Many types of cancers can be detected early and may often be prevented. Lung Cancer  You should be screened every year for lung cancer if: ? You are a current smoker who has smoked for at least 30 years. ? You are a former smoker who has quit within the past 15 years.  Talk to your health care provider about your screening options, when you should start screening, and how often you should be screened.  Colorectal Cancer  Routine colorectal cancer screening usually begins at 71 years of age and should be repeated every 5-10 years until you are 71 years old. You may need to be screened more often if early forms of precancerous polyps or small growths are found. Your health care provider may recommend screening at an earlier age if you have risk factors for colon cancer.  Your health care provider may recommend using home test kits to check for hidden blood in the stool.  A small camera at the end of a tube can be used to examine your colon (sigmoidoscopy or colonoscopy). This checks for the earliest forms of colorectal cancer.  Prostate and Testicular Cancer  Depending on your age and overall health, your health care provider may do certain tests to screen for prostate and testicular cancer.  Talk to your health care provider about any symptoms or concerns you have about testicular or prostate cancer.  Skin Cancer  Check your skin from head to toe regularly.  Tell your health care provider about any new moles or changes in moles, especially if: ? There is a change in a mole's size, shape, or color. ? You have a mole that is larger than a pencil eraser.  Always use sunscreen. Apply sunscreen liberally and  repeat throughout the day.  Protect yourself by wearing long sleeves, pants, a wide-brimmed hat, and sunglasses when outside.  What should I know about heart disease, diabetes, and high blood pressure?  If you are 85-56 years of age, have your blood pressure checked every 3-5 years. If you are 18 years of age or older, have your blood pressure checked every year. You should have your blood pressure measured twice-once when you are at a hospital or clinic, and once when you are not at a hospital or clinic. Record the average of the two measurements. To check your blood pressure when you are not at a hospital or clinic, you can use: ? An automated blood pressure machine at a pharmacy. ? A home blood pressure monitor.  Talk to your health care provider about your target blood pressure.  If you are between 47-12 years old, ask your health care provider if you should take aspirin to prevent heart disease.  Have regular diabetes screenings by checking your fasting blood sugar level. ? If you are at a normal weight and have a low risk for diabetes, have this test once every three years after the age of 58. ? If you are overweight and have a high risk for diabetes, consider being tested at a younger age or more often.  A one-time screening for abdominal aortic aneurysm (AAA) by ultrasound is recommended for men aged 70-75 years who are current or former smokers. What should I know about preventing infection? Hepatitis B If you have a higher risk for hepatitis B, you should be screened for this virus. Talk with your health care provider to find out if you are at risk for hepatitis B infection. Hepatitis C Blood testing is recommended for:  Everyone born from [birth year] through 1964.  Anyone with known risk factors for hepatitis C.  Sexually Transmitted Diseases (STDs)  You should be screened each year for STDs including gonorrhea and chlamydia if: ? You are sexually active and are younger than 71  years of age. ? You are older than 71 years of age and your health care provider tells you that you are at risk for this type of infection. ? Your sexual activity has changed since you were last screened and you are at an increased risk for chlamydia or gonorrhea. Ask your health care provider if you are at risk.  Talk with your health care provider about whether you are at high risk of being infected with HIV. Your health care provider may recommend a prescription medicine to help prevent HIV infection.  What else can I do?  Schedule regular health, dental, and eye exams.  Stay current with your vaccines (immunizations).  Do not use any tobacco products, such as cigarettes, chewing tobacco, and e-cigarettes. If you need help quitting, ask your health care provider.  Limit alcohol intake to no more than 2  drinks per day. One drink equals 12 ounces of beer, 5 ounces of wine, or 1 ounces of hard liquor.  Do not use street drugs.  Do not share needles.  Ask your health care provider for help if you need support or information about quitting drugs.  Tell your health care provider if you often feel depressed.  Tell your health care provider if you have ever been abused or do not feel safe at home. This information is not intended to replace advice given to you by your health care provider. Make sure you discuss any questions you have with your health care provider. Document Released: 03/19/2008 Document Revised: 05/20/2016 Document Reviewed: 06/25/2015 Elsevier Interactive Patient Education  [employer].

## 2018-07-28 NOTE — Assessment & Plan Note (Signed)
hgba1c acceptable, minimize simple carbs. Increase exercise as tolerated.  

## 2018-07-28 NOTE — Assessment & Plan Note (Signed)
Encouraged heart healthy diet, increase exercise, avoid trans fats, consider a krill oil cap daily 

## 2018-07-28 NOTE — Assessment & Plan Note (Signed)
No recent flares. Change Lasix and potassium to prn.

## 2018-07-28 NOTE — Progress Notes (Signed)
Subjective:    Patient ID: Ryan [name], male    DOB: 1946/04/14, 72 y.o.   MRN: [id number]  No chief complaint on file.   HPI Patient is in today for follow up. He feels mostly well.  He notes his peripheral edema is greatly improved.  He does not like having to swallow the potassium pill and is questioning whether or not he can stop his furosemide or potassium pills.  No recent febrile illness or hospitalizations.  No polyuria or polydipsia.  Does continue to struggle with some fatigue and notes he naps each afternoon.  Does wake up somewhat refreshed from the nap.  They deny any snoring or restless sleep. Denies CP/palp/SOB/HA/congestion/fevers/GI or GU c/o. Taking meds as prescribed  Past Medical History:  Diagnosis Date  . A-fib ([place]) 03/22/2018  . Anemia   . Atrial flutter ([place]) 03/31/2012   converted spontaneously to sinus  . CAD (coronary artery disease)    reportedly moderate CAD; managed medically  . COPD (chronic obstructive pulmonary disease) ([place])   . Eczema 08/03/2014  . Edema 03/22/2018  . GERD (gastroesophageal reflux disease)   . H/O: rheumatic fever   . Heart murmur   . Heart murmur   . High risk medication use    on amiodarone since 03/31/2012  . History of colonoscopy   . Hyperglycemia 06/21/2017  . Hyperlipidemia   . Hypertension   . Kidney stone 06/21/2017  . Kidney stones   . Lobar pneumonia ([place]) 03/22/2018  . Low vitamin D level 06/21/2017  . Macular degeneration   . Osteoporosis 05/31/2016    Past Surgical History:  Procedure Laterality Date  . APPENDECTOMY    . CARDIAC CATHETERIZATION  04/22/2011   moderate left main and RCA stenosis not significant by FFR and IVUS on medical therapy  . CARDIOVERSION N/A 04/25/2018   Procedure: CARDIOVERSION;  Surgeon: Skeet Latch, MD;  Location: [facility] ENDOSCOPY;  Service: Cardiovascular;  Laterality: N/A;  . CERVICAL DISCECTOMY     C5,6,7 disc fused  with plate and 5 1" screws  . CHOLECYSTECTOMY    . FOOT  SURGERY     right calcification removed from top of foot  . knee cartiledge Right    right knee  . MOUTH SURGERY     periodontal surgery, bridges, splint in front,     Family History  Problem Relation Age of Onset  . Heart disease Mother   . Diabetes Mother   . Cirrhosis Mother   . Emphysema Mother        never smoked but 2nd hand through her spouse  . Hypertension Mother   . Macular degeneration Mother   . Heart disease Father   . Cancer Father        prostate  . Hyperlipidemia Father   . Hypertension Father   . Varicose Veins Father   . Heart attack Father   . Peripheral vascular disease Father   . Heart disease Sister   . Arthritis Sister   . Hyperlipidemia Sister   . Obesity Sister   . Macular degeneration Sister   . Heart disease Brother        5 stents  . Hyperlipidemia Brother   . Macular degeneration Maternal Grandfather   . Cirrhosis Sister   . Obesity Sister   . Arthritis Sister   . Heart disease Sister   . Obesity Sister   . Liver disease Unknown   . Prostate cancer Unknown   . Coronary artery disease Unknown  Social History   Socioeconomic History  . Marital status: Married    Spouse name: Not on file  . Number of children: 0  . Years of education: Not on file  . Highest education level: Not on file  Occupational History  . Occupation: retired    Fish farm manager: RETIRED  Social Needs  . Financial resource strain: Not on file  . Food insecurity:    Worry: Not on file    Inability: Not on file  . Transportation needs:    Medical: Not on file    Non-medical: Not on file  Tobacco Use  . Smoking status: Former Smoker    Packs/day: 0.50    Years: 50.00    Pack years: 25.00    Types: Cigarettes    Last attempt to quit: 05/01/2013    Years since quitting: 5.2  . Smokeless tobacco: Former Systems developer  . Tobacco comment: using e-[employer]ig//ldc  Substance and Sexual Activity  . Alcohol use: No  . Drug use: No  . Sexual activity: Yes    Comment: lives with  wife, no dietary restrictions  Lifestyle  . Physical activity:    Days per week: Not on file    Minutes per session: Not on file  . Stress: Not on file  Relationships  . Social connections:    Talks on phone: Not on file    Gets together: Not on file    Attends religious service: Not on file    Active member of club or organization: Not on file    Attends meetings of clubs or organizations: Not on file    Relationship status: Not on file  . Intimate partner violence:    Fear of current or ex partner: Not on file    Emotionally abused: Not on file    Physically abused: Not on file    Forced sexual activity: Not on file  Other Topics Concern  . Not on file  Social History Narrative  . Not on file    Outpatient Medications Prior to Visit  Medication Sig Dispense Refill  . amoxicillin (AMOXIL) 500 MG capsule Take 2,000 mg by mouth See admin instructions. Take 2000 mg 1 hour prior to dental work    . brimonidine (ALPHAGAN) 0.2 % ophthalmic solution Instill 1 drop into the left eye twice daily  7  . Cholecalciferol (VITAMIN D3) 5000 units CAPS Take 5,000 Units by mouth every Monday, Wednesday, and Friday.    . clobetasol (TEMOVATE) 0.05 % external solution APPLY 1 APPLICATION TOPICALLY TWICE DAILY (Patient taking differently: Apply 1 application topically 2 (two) times daily as needed (psoriasis). ) 100 mL 3  . ELIQUIS 5 MG TABS tablet TAKE 1 TABLET BY MOUTH TWO  TIMES DAILY 180 tablet 1  . fluconazole (DIFLUCAN) 100 MG tablet 1 tab po daily x 3 days can repeat in 1 week 6 tablet 2  . latanoprost (XALATAN) 0.005 % ophthalmic solution Place 1 drop into the left eye at bedtime.     . metoprolol tartrate (LOPRESSOR) 25 MG tablet Take 1 tablet (25 mg total) by mouth 2 (two) times daily. 180 tablet 3  . Multiple Vitamins-Minerals (ICAPS MV) TABS Take 1 tablet by mouth 2 (two) times daily.     Marland Kitchen omeprazole (PRILOSEC OTC) 20 MG tablet Take 20 mg by mouth daily.    . propafenone (RYTHMOL) 225  MG tablet Take 1 tablet (225 mg total) by mouth 2 (two) times daily. 180 tablet 3  . Ranibizumab (LUCENTIS IO) Inject  1 Dose into the eye every 3 (three) months. (Macular Degeneration)    . tamsulosin (FLOMAX) 0.4 MG CAPS capsule Take 1 capsule (0.4 mg total) by mouth daily. (Patient taking differently: Take 0.4 mg by mouth at bedtime. ) 30 capsule 0  . furosemide (LASIX) 20 MG tablet Take 1 tablet (20 mg total) by mouth daily. 90 tablet 2  . potassium chloride (K-DUR) 10 MEQ tablet Take 1 tablet (10 mEq total) by mouth daily. 90 tablet 3   No facility-administered medications prior to visit.     Allergies  Allergen Reactions  . Mucinex D [Pseudoephedrine-Guaifenesin Er] Anaphylaxis    Stomach cramps  . Polymyxin B-Trimethoprim Swelling    Eye drops made eyes swell  . Codeine Hives, Itching and Rash  . Statins Other (See Comments)    Muscle cramps   . Pseudoephedrine-Guaifenesin Nausea And Vomiting    Stomach cramps  . Tapentadol Other (See Comments)    unknown   . Ciprofloxacin Hives, Itching, Nausea Only and Rash  . Moxifloxacin Nausea Only and Other (See Comments)    Stomach cramps  . Rofecoxib     Stomach cramping     Review of Systems  Constitutional: Positive for malaise/fatigue. Negative for fever.  HENT: Negative for congestion.   Eyes: Negative for blurred vision.  Respiratory: Negative for shortness of breath.   Cardiovascular: Negative for chest pain, palpitations and leg swelling.  Gastrointestinal: Negative for abdominal pain, blood in stool and nausea.  Genitourinary: Negative for dysuria and frequency.  Musculoskeletal: Negative for falls.  Skin: Negative for rash.  Neurological: Negative for dizziness, loss of consciousness and headaches.  Endo/Heme/Allergies: Negative for environmental allergies.  Psychiatric/Behavioral: Negative for depression. The patient is not nervous/anxious.        Objective:    Physical Exam  Constitutional: He is oriented to  person, place, and time. He appears well-developed and well-nourished. No distress.  HENT:  Head: Normocephalic and atraumatic.  Nose: Nose normal.  Eyes: Right eye exhibits no discharge. Left eye exhibits no discharge.  Neck: Normal range of motion. Neck supple.  Cardiovascular: Normal rate and regular rhythm.  No murmur heard. Pulmonary/Chest: Effort normal and breath sounds normal.  Abdominal: Soft. Bowel sounds are normal. There is no tenderness.  Musculoskeletal: He exhibits no edema.  Neurological: He is alert and oriented to person, place, and time.  Skin: Skin is warm and dry.  Psychiatric: He has a normal mood and affect.  Nursing note and vitals reviewed.   BP (!) 142/72 (BP Location: Left Arm, Patient Position: Sitting, Cuff Size: Normal)   Pulse 61   Temp 97.7 F (36.5 C) (Oral)   Resp 18   Wt 176 lb 6.4 oz (80 kg)   SpO2 98%   BMI 30.28 kg/m  Wt Readings from Last 3 Encounters:  07/28/18 176 lb 6.4 oz (80 kg)  05/27/18 173 lb (78.5 kg)  05/24/18 175 lb 9.6 oz (79.7 kg)     Lab Results  Component Value Date   WBC 14.7 (H) 05/24/2018   HGB 14.8 05/24/2018   HCT 43.0 05/24/2018   PLT 247.0 05/24/2018   GLUCOSE 95 05/24/2018   CHOL 222 (H) 04/19/2018   TRIG 81.0 04/19/2018   HDL 48.00 04/19/2018   LDLDIRECT 134.2 12/01/2013   LDLCALC 158 (H) 04/19/2018   ALT 15 05/24/2018   AST 14 05/24/2018   NA 136 05/24/2018   K 5.2 (H) 05/24/2018   CL 98 05/24/2018   CREATININE 1.34 05/24/2018  BUN 30 (H) 05/24/2018   CO2 27 05/24/2018   TSH 2.13 04/19/2018   PSA 1.64 01/14/2015   INR 1.17 04/01/2012   HGBA1C 5.4 04/19/2018    Lab Results  Component Value Date   TSH 2.13 04/19/2018   Lab Results  Component Value Date   WBC 14.7 (H) 05/24/2018   HGB 14.8 05/24/2018   HCT 43.0 05/24/2018   MCV 89.9 05/24/2018   PLT 247.0 05/24/2018   Lab Results  Component Value Date   NA 136 05/24/2018   K 5.2 (H) 05/24/2018   CO2 27 05/24/2018   GLUCOSE 95  05/24/2018   BUN 30 (H) 05/24/2018   CREATININE 1.34 05/24/2018   BILITOT 0.7 05/24/2018   ALKPHOS 55 05/24/2018   AST 14 05/24/2018   ALT 15 05/24/2018   PROT 7.5 05/24/2018   ALBUMIN 4.4 05/24/2018   CALCIUM 10.2 05/24/2018   ANIONGAP 14 03/25/2018   GFR 55.79 (L) 05/24/2018   Lab Results  Component Value Date   CHOL 222 (H) 04/19/2018   Lab Results  Component Value Date   HDL 48.00 04/19/2018   Lab Results  Component Value Date   LDLCALC 158 (H) 04/19/2018   Lab Results  Component Value Date   TRIG 81.0 04/19/2018   Lab Results  Component Value Date   CHOLHDL 5 04/19/2018   Lab Results  Component Value Date   HGBA1C 5.4 04/19/2018       Assessment & Plan:   Problem List Items Addressed This Visit    Hyperlipidemia, mixed    Encouraged heart healthy diet, increase exercise, avoid trans fats, consider a krill oil cap daily      Relevant Medications   furosemide (LASIX) 20 MG tablet   Other Relevant Orders   Lipid panel   Essential hypertension    Well controlled, no changes to meds. Encouraged heart healthy diet such as the DASH diet and exercise as tolerated.       Relevant Medications   furosemide (LASIX) 20 MG tablet   Other Relevant Orders   Comprehensive metabolic panel   TSH   Low vitamin D level    Supplement and monitor      Relevant Orders   VITAMIN D 25 Hydroxy (Vit-D Deficiency, Fractures)   Hyperglycemia    hgba1c acceptable, minimize simple carbs. Increase exercise as tolerated.       Relevant Orders   Hemoglobin A1c   CHF (congestive heart failure) (HCC)    No recent flares. Change Lasix and potassium to prn.      Relevant Medications   furosemide (LASIX) 20 MG tablet    Other Visit Diagnoses    Leukocytosis, unspecified type    -  Primary   Relevant Orders   CBC w/Diff   Vitamin D deficiency       Relevant Orders   VITAMIN D 25 Hydroxy (Vit-D Deficiency, Fractures)      I have changed Lonza L. Carlota Raspberry "L"'s  furosemide and potassium chloride. I am also having him maintain his ICAPS MV, omeprazole, Ranibizumab (LUCENTIS IO), latanoprost, clobetasol, tamsulosin, propafenone, brimonidine, ELIQUIS, amoxicillin, Vitamin D3, fluconazole, and metoprolol tartrate.  Meds ordered this encounter  Medications  . furosemide (LASIX) 20 MG tablet    Sig: Take 1 tablet (20 mg total) by mouth daily as needed for edema.    Dispense:  90 tablet    Refill:  2  . potassium chloride (K-DUR) 10 MEQ tablet    Sig: Take 1 tablet (10  mEq total) by mouth daily as needed.    Dispense:  90 tablet    Refill:  0     Penni Homans, MD

## 2018-07-29 MED ORDER — FERROUS FUMARATE 325 (106 FE) MG PO TABS: 1.0000 | ORAL_TABLET | Freq: Every day | ORAL | 1 refills | Status: DC

## 2018-07-29 NOTE — Addendum Note (Signed)
Addended by: Magdalene Molly A on: 07/29/2018 02:37 PM   Modules accepted: Orders

## 2018-08-25 DIAGNOSIS — M48061 Spinal stenosis, lumbar region without neurogenic claudication: Secondary | ICD-10-CM | POA: Diagnosis not present

## 2018-08-25 DIAGNOSIS — M4726 Other spondylosis with radiculopathy, lumbar region: Secondary | ICD-10-CM | POA: Diagnosis not present

## 2018-08-25 DIAGNOSIS — M5136 Other intervertebral disc degeneration, lumbar region: Secondary | ICD-10-CM | POA: Diagnosis not present

## 2018-08-26 DIAGNOSIS — N400 Enlarged prostate without lower urinary tract symptoms: Secondary | ICD-10-CM | POA: Diagnosis not present

## 2018-09-13 DIAGNOSIS — Z23 Encounter for immunization: Secondary | ICD-10-CM | POA: Diagnosis not present

## 2018-09-13 DIAGNOSIS — L821 Other seborrheic keratosis: Secondary | ICD-10-CM | POA: Diagnosis not present

## 2018-09-13 DIAGNOSIS — L719 Rosacea, unspecified: Secondary | ICD-10-CM | POA: Diagnosis not present

## 2018-09-15 DIAGNOSIS — H353110 Nonexudative age-related macular degeneration, right eye, stage unspecified: Secondary | ICD-10-CM | POA: Diagnosis not present

## 2018-09-15 DIAGNOSIS — H401122 Primary open-angle glaucoma, left eye, moderate stage: Secondary | ICD-10-CM | POA: Diagnosis not present

## 2018-09-15 DIAGNOSIS — H353221 Exudative age-related macular degeneration, left eye, with active choroidal neovascularization: Secondary | ICD-10-CM | POA: Diagnosis not present

## 2018-09-15 DIAGNOSIS — H25813 Combined forms of age-related cataract, bilateral: Secondary | ICD-10-CM | POA: Diagnosis not present

## 2018-09-15 DIAGNOSIS — H35372 Puckering of macula, left eye: Secondary | ICD-10-CM | POA: Diagnosis not present

## 2018-10-12 DIAGNOSIS — H401122 Primary open-angle glaucoma, left eye, moderate stage: Secondary | ICD-10-CM | POA: Diagnosis not present

## 2018-10-12 DIAGNOSIS — H2513 Age-related nuclear cataract, bilateral: Secondary | ICD-10-CM | POA: Diagnosis not present

## 2018-10-30 ENCOUNTER — Other Ambulatory Visit: Payer: Self-pay | Admitting: Internal Medicine

## 2018-10-31 NOTE — Telephone Encounter (Signed)
Pt last saw [name] 05/27/18, last labs 05/24/18 Creat 1.34, age 73, weight 80kg, based on specified criteria pt is on appropriate dosage of Eliquis 5mg  BID.  Will refill rx.

## 2018-11-05 ENCOUNTER — Other Ambulatory Visit: Payer: Self-pay | Admitting: Internal Medicine

## 2018-11-05 DIAGNOSIS — I4891 Unspecified atrial fibrillation: Secondary | ICD-10-CM

## 2018-11-08 ENCOUNTER — Encounter: Payer: Self-pay | Admitting: Internal Medicine

## 2018-11-15 DIAGNOSIS — M5136 Other intervertebral disc degeneration, lumbar region: Secondary | ICD-10-CM | POA: Diagnosis not present

## 2018-11-15 DIAGNOSIS — R030 Elevated blood-pressure reading, without diagnosis of hypertension: Secondary | ICD-10-CM | POA: Diagnosis not present

## 2018-11-15 DIAGNOSIS — M4726 Other spondylosis with radiculopathy, lumbar region: Secondary | ICD-10-CM | POA: Diagnosis not present

## 2018-11-15 DIAGNOSIS — M5416 Radiculopathy, lumbar region: Secondary | ICD-10-CM | POA: Diagnosis not present

## 2018-11-15 DIAGNOSIS — Z6830 Body mass index (BMI) 30.0-30.9, adult: Secondary | ICD-10-CM | POA: Diagnosis not present

## 2018-11-15 DIAGNOSIS — M48062 Spinal stenosis, lumbar region with neurogenic claudication: Secondary | ICD-10-CM | POA: Diagnosis not present

## 2018-11-29 ENCOUNTER — Ambulatory Visit: Payer: Medicare Other | Admitting: Internal Medicine

## 2018-11-29 DIAGNOSIS — Z79899 Other long term (current) drug therapy: Secondary | ICD-10-CM | POA: Diagnosis not present

## 2018-11-29 DIAGNOSIS — H401124 Primary open-angle glaucoma, left eye, indeterminate stage: Secondary | ICD-10-CM | POA: Diagnosis not present

## 2018-11-29 DIAGNOSIS — H2513 Age-related nuclear cataract, bilateral: Secondary | ICD-10-CM | POA: Diagnosis not present

## 2018-11-29 DIAGNOSIS — H353231 Exudative age-related macular degeneration, bilateral, with active choroidal neovascularization: Secondary | ICD-10-CM | POA: Diagnosis not present

## 2018-11-29 DIAGNOSIS — H25813 Combined forms of age-related cataract, bilateral: Secondary | ICD-10-CM | POA: Diagnosis not present

## 2018-11-29 DIAGNOSIS — H401122 Primary open-angle glaucoma, left eye, moderate stage: Secondary | ICD-10-CM | POA: Diagnosis not present

## 2018-11-29 DIAGNOSIS — Z9889 Other specified postprocedural states: Secondary | ICD-10-CM | POA: Diagnosis not present

## 2018-12-08 DIAGNOSIS — H353231 Exudative age-related macular degeneration, bilateral, with active choroidal neovascularization: Secondary | ICD-10-CM | POA: Diagnosis not present

## 2018-12-26 DIAGNOSIS — M4726 Other spondylosis with radiculopathy, lumbar region: Secondary | ICD-10-CM | POA: Diagnosis not present

## 2018-12-26 DIAGNOSIS — M48061 Spinal stenosis, lumbar region without neurogenic claudication: Secondary | ICD-10-CM | POA: Diagnosis not present

## 2018-12-26 DIAGNOSIS — M5136 Other intervertebral disc degeneration, lumbar region: Secondary | ICD-10-CM | POA: Diagnosis not present

## 2019-01-05 DIAGNOSIS — H353231 Exudative age-related macular degeneration, bilateral, with active choroidal neovascularization: Secondary | ICD-10-CM | POA: Diagnosis not present

## 2019-01-10 ENCOUNTER — Telehealth: Payer: Self-pay | Admitting: Internal Medicine

## 2019-01-10 NOTE — Telephone Encounter (Signed)
New message    Patient declined virtual visit. He said he would prefer to come into the office. Entered recall for patient for Aug. Pt aware he will get a letter in the mail to call and schedule this appt at a later time.

## 2019-01-10 NOTE — Telephone Encounter (Signed)
complete

## 2019-01-13 ENCOUNTER — Ambulatory Visit: Payer: Medicare Other | Admitting: Internal Medicine

## 2019-01-16 ENCOUNTER — Encounter: Payer: Medicare Other | Admitting: Family Medicine

## 2019-02-09 DIAGNOSIS — H35361 Drusen (degenerative) of macula, right eye: Secondary | ICD-10-CM | POA: Diagnosis not present

## 2019-02-09 DIAGNOSIS — H353231 Exudative age-related macular degeneration, bilateral, with active choroidal neovascularization: Secondary | ICD-10-CM | POA: Diagnosis not present

## 2019-02-09 DIAGNOSIS — H3554 Dystrophies primarily involving the retinal pigment epithelium: Secondary | ICD-10-CM | POA: Diagnosis not present

## 2019-02-19 ENCOUNTER — Other Ambulatory Visit: Payer: Self-pay | Admitting: Internal Medicine

## 2019-03-16 DIAGNOSIS — H353231 Exudative age-related macular degeneration, bilateral, with active choroidal neovascularization: Secondary | ICD-10-CM | POA: Diagnosis not present

## 2019-03-21 DIAGNOSIS — M5416 Radiculopathy, lumbar region: Secondary | ICD-10-CM | POA: Diagnosis not present

## 2019-03-21 DIAGNOSIS — M5136 Other intervertebral disc degeneration, lumbar region: Secondary | ICD-10-CM | POA: Diagnosis not present

## 2019-03-21 DIAGNOSIS — R030 Elevated blood-pressure reading, without diagnosis of hypertension: Secondary | ICD-10-CM | POA: Diagnosis not present

## 2019-03-21 DIAGNOSIS — M4726 Other spondylosis with radiculopathy, lumbar region: Secondary | ICD-10-CM | POA: Diagnosis not present

## 2019-03-21 DIAGNOSIS — Z6831 Body mass index (BMI) 31.0-31.9, adult: Secondary | ICD-10-CM | POA: Diagnosis not present

## 2019-03-21 DIAGNOSIS — M48062 Spinal stenosis, lumbar region with neurogenic claudication: Secondary | ICD-10-CM | POA: Diagnosis not present

## 2019-03-25 IMAGING — CT CT CTA ABD/PEL W/CM AND/OR W/O CM
2 of 9 series · 11 of 46 positions shown, 15 images · IV contrast (iopamidol)
Comparison: CTA E 02/26/2014

CLINICAL DATA: 71-year-old with bright red diarrhea and abdominal
pain for 2 days. Evaluate for acute mesenteric ischemia.

EXAM:
CT ANGIOGRAPHY ABDOMEN AND PELVIS WITH CONTRAST AND WITHOUT CONTRAST
TECHNIQUE: Multidetector CT imaging of the abdomen and pelvis was performed
using the standard protocol during bolus administration of
intravenous contrast. Multiplanar reconstructed images and MIPs were
obtained and reviewed to evaluate the vascular anatomy.
CONTRAST:  100mL AN9WXH-OTW IOPAMIDOL (AN9WXH-OTW) INJECTION 76%

[Series 7: axial venous · axial · portal-venous · 0.75mm/px · z∈[-474,-76]mm · 9 of 237 slices shown, 13 images]
[im 19/237  soft-tissue]
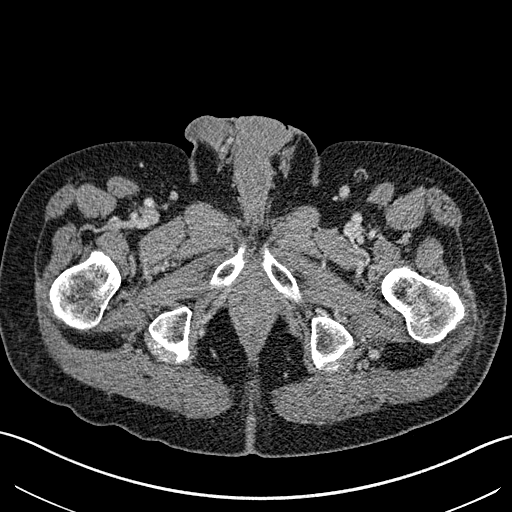
[im 19/237  bone]
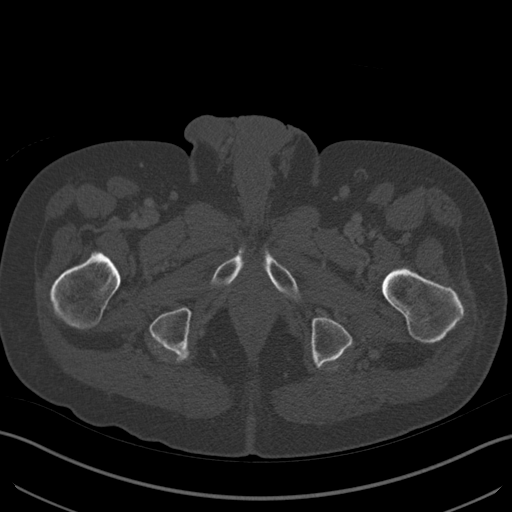
[im 55/237  soft-tissue]
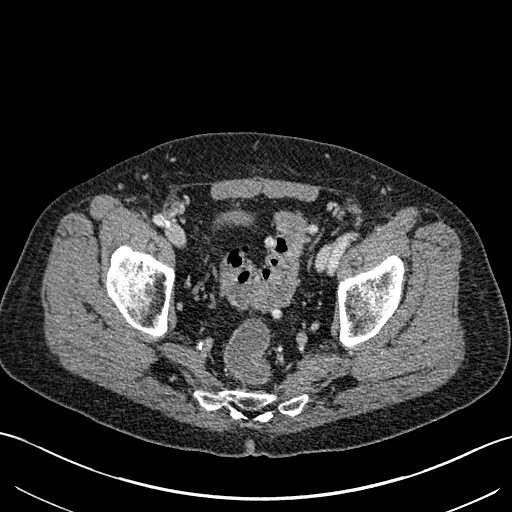
[im 73/237  soft-tissue]
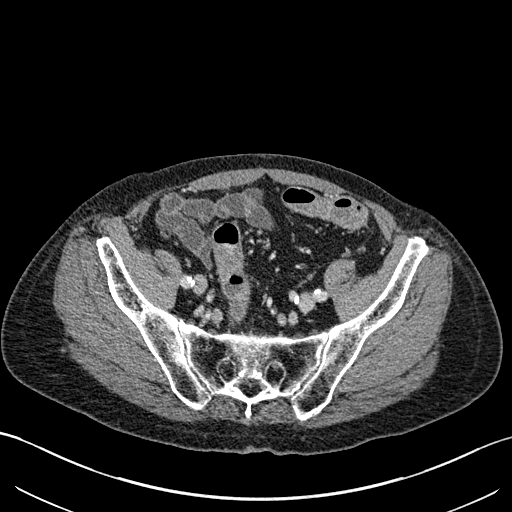
[im 109/237  soft-tissue]
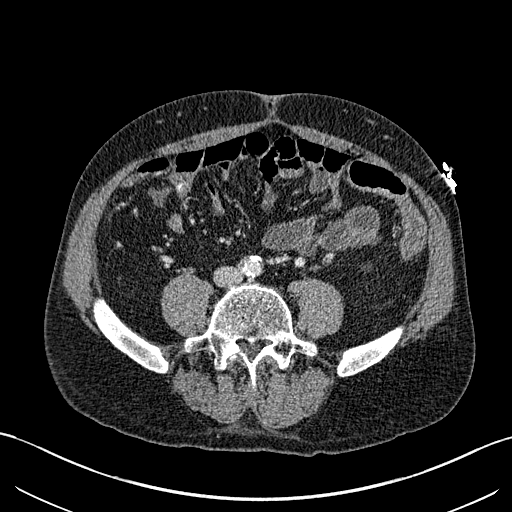
[im 128/237  soft-tissue]
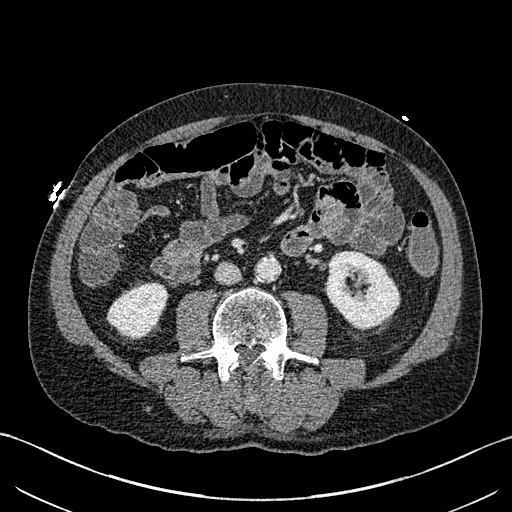
[im 164/237  soft-tissue]
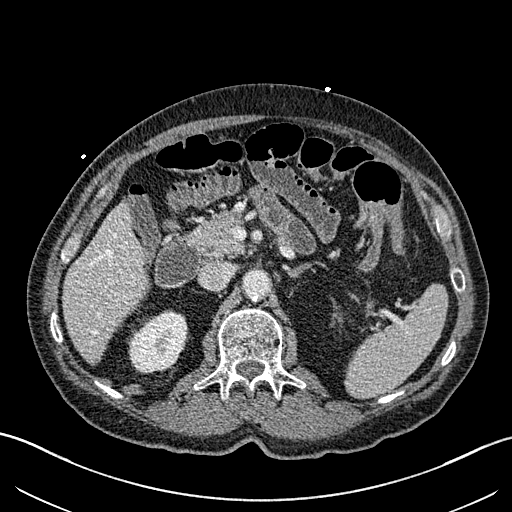
[im 164/237  lung]
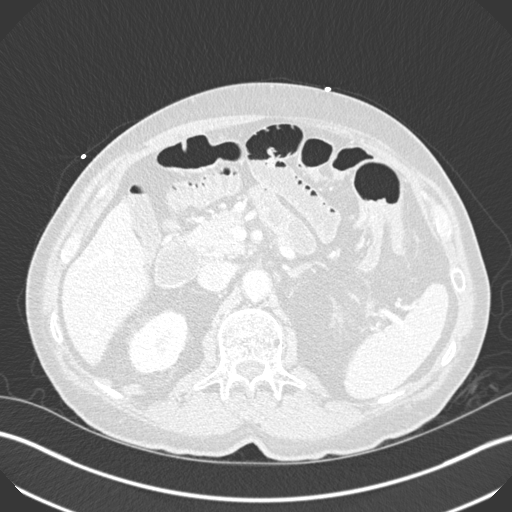
[im 182/237  soft-tissue]
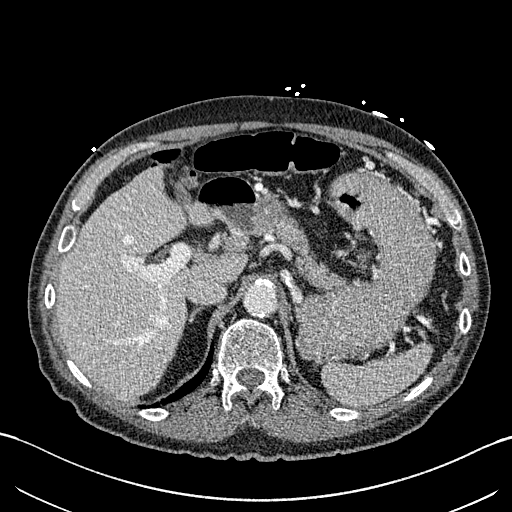
[im 182/237  lung]
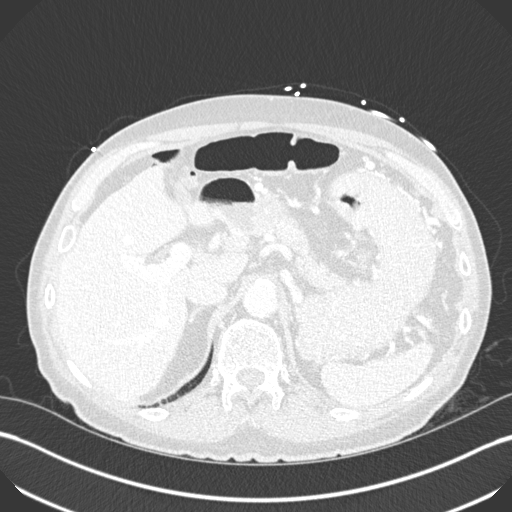
[im 200/237  lung]
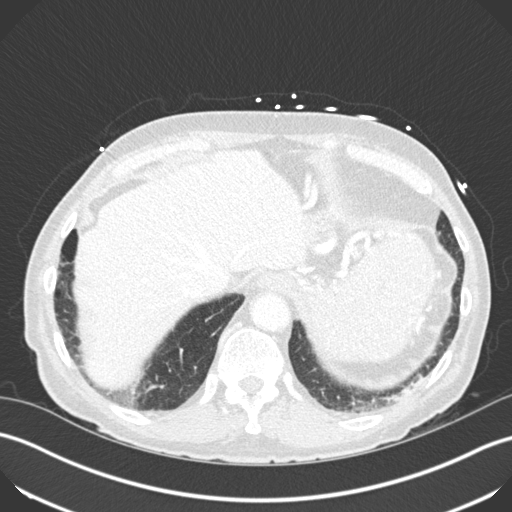
[im 218/237  soft-tissue]
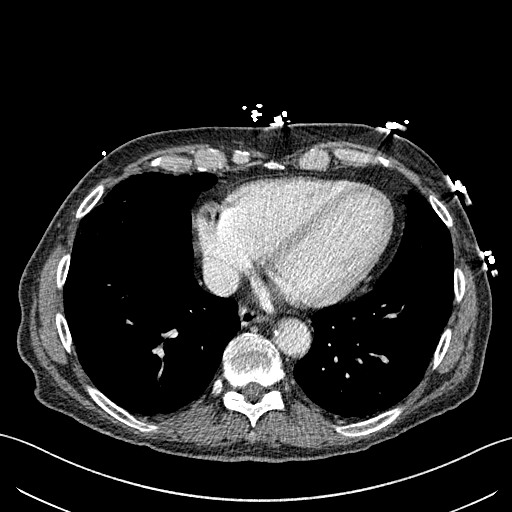
[im 218/237  lung]
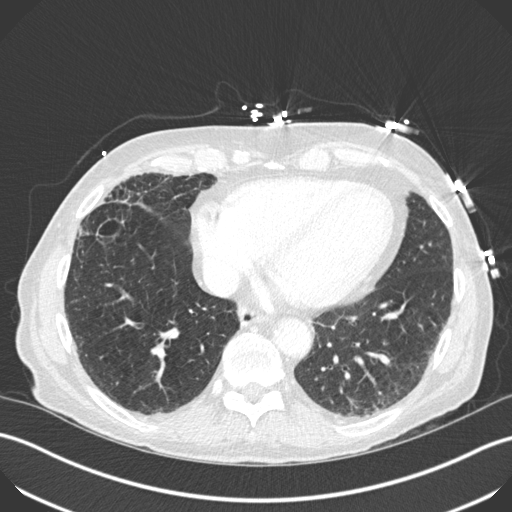

[Series 9: coronal mpr · coronal · 0.64mm/px · 2 of 132 slices shown]
[im 44/132  soft-tissue]
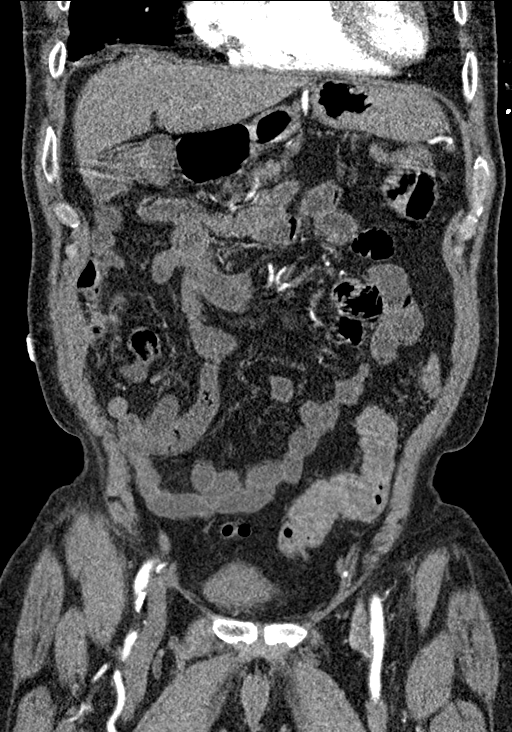
[im 88/132  soft-tissue]
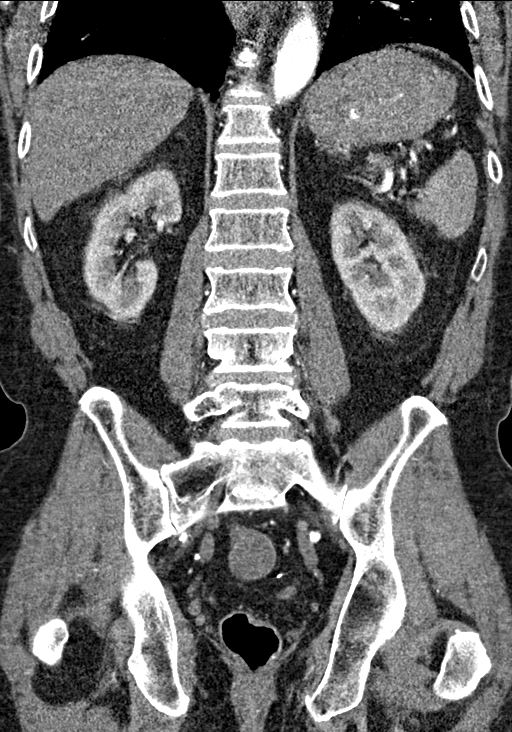

[11 of 46 positions shown; findings below may reference images not displayed]

FINDINGS: VASCULAR

Aorta: Diffuse atherosclerotic disease in the abdominal aorta
without aneurysm. No significant aortic stenosis.

Celiac: Patent without significant stenosis. Mild narrowing at the
origin may be related to median arcuate ligament. Prominent left
gastric artery.

SMA: Mild stenosis at the origin due to mixed plaque. No significant
SMA stenosis. Main branch vessels of the SMA are patent.

Renals: Single right renal artery is patent without significant
stenosis. Total of three left renal arteries. No significant
stenosis involving the renal arteries.

IMA: Narrowing at the origin of the IMA but the IMA is patent. Main
branch vessels are patent.

Inflow: Atherosclerotic disease and mild narrowing at the origin of
the common iliac arteries, right side greater the left. No
significant stenosis in the external iliac arteries. Flow in the
bilateral internal iliac arteries.

Proximal Outflow: Proximal femoral arteries are patent.

Veins: Portal venous system is patent. IVC and renal veins are
patent. Main iliac veins are patent.

Review of the MIP images confirms the above findings.

NON-VASCULAR

Lower chest: Blebs or bulla in the right lower lobe. There may be
underlying emphysema. Peripheral reticular densities at both lung
bases may represent some scarring. No large effusions.

Hepatobiliary: Gallbladder has been removed. Tiny cyst at the liver
dome. No suspicious liver lesions. No biliary dilatation.

Pancreas: Normal appearance of the pancreas without inflammation or
duct dilatation.

Spleen: Normal appearance of spleen without enlargement.

Adrenals/Urinary Tract: Normal adrenal glands. Normal appearance of
both kidneys. 4 mm calcification in the right kidney. Probable cyst
in the right kidney mid pole. Normal appearance of the urinary
bladder.

Stomach/Bowel: There appears to be diffuse wall thickening at the
gastric fundus and gastric body. Prominent vascular structures
around the proximal stomach. High-density material within the
stomach likely represents ingested material. Fluid-filled loops of
the small bowel without obstruction. Extensive diverticulosis
involving the sigmoid colon without acute inflammatory changes.
Fluid within the colon compatible with history of diarrhea.

Lymphatic: No significant lymph node enlargement in the abdomen or
pelvis.

Reproductive: Prostate is unremarkable.

Other: No free fluid.  Negative for free air.

Musculoskeletal: No acute bone abnormality.
IMPRESSION: VASCULAR

Diffuse atherosclerotic disease involving the aorta and iliac
arteries without aortic aneurysm.

Mesenteric arteries are patent with mild narrowing. Findings have
not significantly changed since 3845 and no evidence to suggest
mesenteric ischemia.

NON-VASCULAR

Possible wall thickening involving the stomach. Findings are
nonspecific.

Nonobstructive right kidney stone.

## 2019-04-09 ENCOUNTER — Other Ambulatory Visit: Payer: Self-pay | Admitting: Internal Medicine

## 2019-04-17 DIAGNOSIS — H2513 Age-related nuclear cataract, bilateral: Secondary | ICD-10-CM | POA: Diagnosis not present

## 2019-04-17 DIAGNOSIS — H401122 Primary open-angle glaucoma, left eye, moderate stage: Secondary | ICD-10-CM | POA: Diagnosis not present

## 2019-04-18 ENCOUNTER — Encounter: Payer: Medicare Other | Admitting: Family Medicine

## 2019-04-20 DIAGNOSIS — H353231 Exudative age-related macular degeneration, bilateral, with active choroidal neovascularization: Secondary | ICD-10-CM | POA: Diagnosis not present

## 2019-05-11 DIAGNOSIS — M48061 Spinal stenosis, lumbar region without neurogenic claudication: Secondary | ICD-10-CM | POA: Diagnosis not present

## 2019-05-11 DIAGNOSIS — M5136 Other intervertebral disc degeneration, lumbar region: Secondary | ICD-10-CM | POA: Diagnosis not present

## 2019-05-11 DIAGNOSIS — M4726 Other spondylosis with radiculopathy, lumbar region: Secondary | ICD-10-CM | POA: Diagnosis not present

## 2019-05-25 ENCOUNTER — Other Ambulatory Visit: Payer: Self-pay | Admitting: Family Medicine

## 2019-05-25 DIAGNOSIS — H353231 Exudative age-related macular degeneration, bilateral, with active choroidal neovascularization: Secondary | ICD-10-CM | POA: Diagnosis not present

## 2019-05-25 MED ORDER — ALBUTEROL SULFATE HFA 108 (90 BASE) MCG/ACT IN AERS: 1.0000 | INHALATION_SPRAY | Freq: Four times a day (QID) | RESPIRATORY_TRACT | 2 refills | Status: DC | PRN

## 2019-05-31 DIAGNOSIS — Z23 Encounter for immunization: Secondary | ICD-10-CM | POA: Diagnosis not present

## 2019-06-02 ENCOUNTER — Encounter: Payer: Self-pay | Admitting: *Deleted

## 2019-06-04 ENCOUNTER — Other Ambulatory Visit: Payer: Self-pay | Admitting: Internal Medicine

## 2019-06-05 NOTE — Telephone Encounter (Signed)
72m 80kg Scr 1.01 07/28/18 Lovw/taylor 05/27/18

## 2019-06-28 ENCOUNTER — Telehealth: Payer: Self-pay | Admitting: Internal Medicine

## 2019-06-28 NOTE — Telephone Encounter (Signed)
New Message  Patient's wife is calling in to get clearance to accompany the patient to his appointment. States that patient is very unsteady with his balance and she needs to be there to assist. Please give patient a call back to confirm.

## 2019-06-29 NOTE — Telephone Encounter (Signed)
Call placed to wife to confirm ok to assist.

## 2019-06-30 ENCOUNTER — Encounter: Payer: Self-pay | Admitting: Internal Medicine

## 2019-06-30 ENCOUNTER — Other Ambulatory Visit: Payer: Self-pay

## 2019-06-30 ENCOUNTER — Ambulatory Visit (INDEPENDENT_AMBULATORY_CARE_PROVIDER_SITE_OTHER): Payer: Medicare Other | Admitting: Internal Medicine

## 2019-06-30 VITALS — BP 126/82 | HR 68 | Ht 64.0 in | Wt 176.2 lb

## 2019-06-30 DIAGNOSIS — I5032 Chronic diastolic (congestive) heart failure: Secondary | ICD-10-CM

## 2019-06-30 DIAGNOSIS — I480 Paroxysmal atrial fibrillation: Secondary | ICD-10-CM | POA: Diagnosis not present

## 2019-06-30 DIAGNOSIS — I10 Essential (primary) hypertension: Secondary | ICD-10-CM

## 2019-06-30 DIAGNOSIS — J449 Chronic obstructive pulmonary disease, unspecified: Secondary | ICD-10-CM | POA: Diagnosis not present

## 2019-06-30 NOTE — Progress Notes (Signed)
HPI [name] today for ongoing evaluation and management of paroxysmal atrial fibrillation, hypertension, COPD, and diastolic heart failure.In the interim, he has not had chest pain or sob or symptomatic atrial fib. He returns today for followup. He feels well overall. No syncope. [place]. 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 No edema. His atrial fib has been quiet on medical therapy.  Allergies  Allergen Reactions  . Mucinex D [Pseudoephedrine-Guaifenesin Er] Anaphylaxis    Stomach cramps  . Polymyxin B-Trimethoprim Swelling    Eye drops made eyes swell  . Codeine Hives, Itching and Rash  . Statins Other (See Comments)    Muscle cramps   . Pseudoephedrine-Guaifenesin Nausea And Vomiting    Stomach cramps  . Tapentadol Other (See Comments)    unknown   . Ciprofloxacin Hives, Itching, Nausea Only and Rash  . Moxifloxacin Nausea Only and Other (See Comments)    Stomach cramps  . Rofecoxib     Stomach cramping      Current Outpatient Medications  Medication Sig Dispense Refill  . albuterol (VENTOLIN HFA) 108 (90 Base) MCG/ACT inhaler Inhale 1-2 puffs into the lungs every 6 (six) hours as needed for wheezing or shortness of breath. 18 g 2  . amoxicillin (AMOXIL) 500 MG capsule Take 2,000 mg by mouth See admin instructions. Take 2000 mg 1 hour prior to dental work    . brimonidine (ALPHAGAN) 0.2 % ophthalmic solution Instill 1 drop into the left eye twice daily  7  . Cholecalciferol (VITAMIN D3) 5000 units CAPS Take 5,000 Units by mouth every Monday, Wednesday, and Friday.    . clobetasol (TEMOVATE) 0.05 % external solution APPLY 1 APPLICATION TOPICALLY TWICE DAILY (Patient taking differently: Apply 1 application topically 2 (two) times daily as needed (psoriasis). ) 100 mL 3  . ELIQUIS 5 MG TABS tablet TAKE 1 TABLET BY MOUTH TWO  TIMES DAILY 180 tablet 3  . fluconazole (DIFLUCAN) 100 MG tablet 1 tab  po daily x 3 days can repeat in 1 week 6 tablet 2  . furosemide (LASIX) 20 MG tablet TAKE 1 TABLET BY MOUTH  DAILY 90 tablet 1  . latanoprost (XALATAN) 0.005 % ophthalmic solution Place 1 drop into the left eye at bedtime.     . metoprolol tartrate (LOPRESSOR) 25 MG tablet TAKE 1 TABLET BY MOUTH TWO  TIMES DAILY 180 tablet 0  . Multiple Vitamins-Minerals (ICAPS MV) TABS Take 1 tablet by mouth 2 (two) times daily.     Marland Kitchen omeprazole (PRILOSEC OTC) 20 MG tablet Take 20 mg by mouth daily.    . potassium chloride (K-DUR) 10 MEQ tablet Take 1 tablet by mouth daily.    . propafenone (RYTHMOL) 225 MG tablet TAKE 1 TABLET BY MOUTH TWO  TIMES DAILY 180 tablet 2  . Ranibizumab (LUCENTIS IO) Inject 1 Dose into the eye every 3 (three) months. (Macular Degeneration)    . tamsulosin (FLOMAX) 0.4 MG CAPS capsule Take 1 capsule (0.4 mg total) by mouth daily. (Patient taking differently: Take 0.4 mg by mouth at bedtime. ) 30 capsule 0   No current facility-administered medications for this visit.      Past Medical History:  Diagnosis Date  . A-fib ([place]) 03/22/2018  . Anemia   . Atrial flutter ([place]) 03/31/2012   converted spontaneously to sinus  . CAD (coronary artery disease)    reportedly moderate CAD; managed medically  .  COPD (chronic obstructive pulmonary disease) ([place])   . Eczema 08/03/2014  . Edema 03/22/2018  . GERD (gastroesophageal reflux disease)   . H/O: rheumatic fever   . Heart murmur   . Heart murmur   . High risk medication use    on amiodarone since 03/31/2012  . History of colonoscopy   . Hyperglycemia 06/21/2017  . Hyperlipidemia   . Hypertension   . Kidney stone 06/21/2017  . Kidney stones   . Lobar pneumonia ([place]) 03/22/2018  . Low vitamin D level 06/21/2017  . Macular degeneration   . Osteoporosis 05/31/2016    ROS:   All systems reviewed and negative except as noted in the HPI.   Past Surgical History:  Procedure Laterality Date  . APPENDECTOMY    . CARDIAC  CATHETERIZATION  04/22/2011   moderate left main and RCA stenosis not significant by FFR and IVUS on medical therapy  . CARDIOVERSION N/A 04/25/2018   Procedure: CARDIOVERSION;  Surgeon: Skeet Latch, MD;  Location: [facility] END[facility]COPY;  Service: Cardiovascular;  Laterality: N/A;  . CERVICAL DISCECTOMY     C5,6,7 disc fused  with plate and 5 1" screws  . CHOLECYSTECTOMY    . colon polyp removal  3/1.19  . FOOT SURGERY     right calcification removed from top of foot  . knee cartiledge Right    right knee  . MOUTH SURGERY     periodontal surgery, bridges, splint in front,      Family History  Problem Relation Age of Onset  . Heart disease Mother   . Diabetes Mother   . Cirrhosis Mother   . Emphysema Mother        never smoked but 2nd hand through her spouse  . Hypertension Mother   . Macular degeneration Mother   . Heart disease Father   . Cancer Father        prostate  . Hyperlipidemia Father   . Hypertension Father   . Varicose Veins Father   . Heart attack Father   . Peripheral vascular disease Father   . Heart disease Sister   . Arthritis Sister   . Hyperlipidemia Sister   . Obesity Sister   . Macular degeneration Sister   . Heart disease Brother        5 stents  . Hyperlipidemia Brother   . Macular degeneration Maternal Grandfather   . Cirrhosis Sister   . Obesity Sister   . Arthritis Sister   . Heart disease Sister   . Obesity Sister   . Liver disease Other   . Prostate cancer Other   . Coronary artery disease Other      Social History   Socioeconomic History  . Marital status: Married    Spouse name: Not on file  . Number of children: 0  . Years of education: Not on file  . Highest education level: Not on file  Occupational History  . Occupation: retired    Fish farm manager: RETIRED  Social Needs  . Financial resource strain: Not on file  . Food insecurity    Worry: Not on file    Inability: Not on file  . Transportation needs    Medical: Not on file     Non-medical: Not on file  Tobacco Use  . Smoking status: Former Smoker    Packs/day: 0.50    Years: 50.00    Pack years: 25.00    Types: Cigarettes    Quit date: 05/01/2013    Years since quitting:  6.1  . Smokeless tobacco: Former Systems developer  . Tobacco comment: using e-Cig//ldc  Substance and Sexual Activity  . Alcohol use: No  . Drug use: No  . Sexual activity: Yes    Comment: lives with wife, no dietary restrictions  Lifestyle  . Physical activity    Days per week: Not on file    Minutes per session: Not on file  . Stress: Not on file  Relationships  . Social Herbalist on phone: Not on file    Gets together: Not on file    Attends religious service: Not on file    Active member of club or organization: Not on file    Attends meetings of clubs or organizations: Not on file    Relationship status: Not on file  . Intimate partner violence    Fear of current or ex partner: Not on file    Emotionally abused: Not on file    Physically abused: Not on file    Forced sexual activity: Not on file  Other Topics Concern  . Not on file  Social History Narrative  . Not on file     BP 126/82   Pulse 68   Ht 5\' 4"  (1.626 m)   Wt 176 lb 3.2 oz (79.9 kg)   SpO2 98%   BMI 30.24 kg/m   Physical Exam:  Well appearing NAD HEENT: Unremarkable Neck:  No JVD, no thyromegally Lymphatics:  No adenopathy Back:  No CVA tenderness Lungs:  Clear with no wheezes.  HEART:  Regular rate rhythm, no murmurs, no rubs, no clicks Abd:  soft, positive bowel sounds, no organomegally, no rebound, no guarding Ext:  2 plus pulses, no edema, no cyanosis, no clubbing Skin:  No rashes no nodules Neuro:  CN II through XII intact, motor grossly intact  EKG - nsr with STT wave abnormality  Assess/Plan: 1. Chest pain - he has brief sharp, non-exertional stabbing pains which last seconds. 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 His ECG is abnormal and I have asked him to watch out for changes in symptoms. If his symptoms worsen with lower activity levels, then I would consider left heart cath.  2. PAF - he is well controlled. He will continue his current meds. 3. HTN - his blood pressure remains well controlled.  4. COPD - he will continue his bronchodilators. He is not wheezing on exam today.   Mikle Bosworth.D.

## 2019-06-30 NOTE — Patient Instructions (Signed)

## 2019-07-06 DIAGNOSIS — H353231 Exudative age-related macular degeneration, bilateral, with active choroidal neovascularization: Secondary | ICD-10-CM | POA: Diagnosis not present

## 2019-07-16 ENCOUNTER — Other Ambulatory Visit: Payer: Self-pay | Admitting: Internal Medicine

## 2019-07-21 DIAGNOSIS — R1013 Epigastric pain: Secondary | ICD-10-CM | POA: Diagnosis not present

## 2019-07-21 DIAGNOSIS — K5900 Constipation, unspecified: Secondary | ICD-10-CM | POA: Diagnosis not present

## 2019-07-21 DIAGNOSIS — Z86010 Personal history of colonic polyps: Secondary | ICD-10-CM | POA: Diagnosis not present

## 2019-07-25 ENCOUNTER — Telehealth: Payer: Self-pay | Admitting: *Deleted

## 2019-07-25 NOTE — Telephone Encounter (Signed)
   [facility] Pre-operative Risk Assessment    Request for surgical clearance:  1. What type of surgery is being performed? COLONOSCOPY & EGD   2. When is this surgery scheduled? 08/14/19   3. What type of clearance is required (medical clearance vs. Pharmacy clearance to hold med vs. Both)? BOTH  4. Are there any medications that need to be held prior to surgery and how long? ELIQUIS   5. Practice name and name of physician performing surgery? [facility]; DR. JEFFREY MEDOFF   6. What is your office phone number [phone]    7.   What is your office fax number [phone]  8.   Anesthesia type (None, local, MAC, general) ? NOT LISTED; PROPOFOL ?    Julaine Hua 07/25/2019, 12:24 PM  _________________________________________________________________   (provider comments below)

## 2019-07-25 NOTE — Telephone Encounter (Signed)
Patient with diagnosis of afib on Eliquis for anticoagulation.    Procedure: COLONOSCOPY & EGD  Date of procedure: 08/14/2019  CHADS2-VASc score of  4 (CHF, HTN, AGE, CAD)  CrCl 63 ml/min  Per office protocol, patient can hold Eliquis for 1-2 days prior to procedure.

## 2019-07-26 NOTE — Telephone Encounter (Signed)
Dr. Lovena Le  Could you comment on this patient proceeding with colonoscopy/EGD? You recently saw him 06/30/2019 and he was having atypical chest pain. In speaking with him today, he feels that his pain is actually radiation from his abdomen. Otherwise he is doing well. I already have recommendations about holding AC from pharmacy. Wanted to make sure you were alright with him proceeding with testing.   Thank You Sharee Pimple

## 2019-07-26 NOTE — Telephone Encounter (Signed)
Ok to hold anti-coagulation. GT

## 2019-07-27 NOTE — Telephone Encounter (Signed)
   Primary Cardiologist: Cristopher [place], MD  Chart reviewed as part of pre-operative protocol coverage. Given past medical history and time since last visit, based on ACC/AHA guidelines, Ryan [name] would be at acceptable risk for the planned procedure without further cardiovascular testing.   Pt was last seen by Dr. Lovena Le 06/30/2019 and was having some atypical chest pain however after speaking with him, likely found to be radiating abdominal pain. Per Dr. Lovena Le, pt may proceed with procedure.   Per pharmacy, patient with diagnosis of afib on Eliquis for anticoagulation and a CHADS2-VASc score of  4 (CHF, HTN, AGE, CAD) and CrCl 63 ml/min  Per office protocol, patient can hold Eliquis for 1-2 days prior to procedure.    I will route this recommendation to the requesting party via Epic fax function and remove from pre-op pool.  Please call with questions.  Kathyrn Drown, NP 07/27/2019, 9:19 AM

## 2019-08-03 ENCOUNTER — Encounter: Payer: Medicare Other | Admitting: Family Medicine

## 2019-08-07 ENCOUNTER — Telehealth: Payer: Self-pay | Admitting: Family Medicine

## 2019-08-07 NOTE — Telephone Encounter (Signed)
Patient declined AWV at this time. SF °

## 2019-08-08 DIAGNOSIS — Z1159 Encounter for screening for other viral diseases: Secondary | ICD-10-CM | POA: Diagnosis not present

## 2019-08-13 ENCOUNTER — Other Ambulatory Visit: Payer: Self-pay | Admitting: Internal Medicine

## 2019-08-13 ENCOUNTER — Other Ambulatory Visit: Payer: Self-pay | Admitting: Family Medicine

## 2019-08-14 ENCOUNTER — Encounter: Payer: Self-pay | Admitting: Family Medicine

## 2019-08-14 DIAGNOSIS — R1033 Periumbilical pain: Secondary | ICD-10-CM | POA: Diagnosis not present

## 2019-08-14 DIAGNOSIS — R194 Change in bowel habit: Secondary | ICD-10-CM | POA: Diagnosis not present

## 2019-08-14 DIAGNOSIS — K635 Polyp of colon: Secondary | ICD-10-CM | POA: Diagnosis not present

## 2019-08-14 DIAGNOSIS — K3189 Other diseases of stomach and duodenum: Secondary | ICD-10-CM | POA: Diagnosis not present

## 2019-08-14 DIAGNOSIS — Z86010 Personal history of colonic polyps: Secondary | ICD-10-CM | POA: Diagnosis not present

## 2019-08-14 DIAGNOSIS — K625 Hemorrhage of anus and rectum: Secondary | ICD-10-CM | POA: Diagnosis not present

## 2019-08-14 DIAGNOSIS — K648 Other hemorrhoids: Secondary | ICD-10-CM | POA: Diagnosis not present

## 2019-08-14 DIAGNOSIS — K449 Diaphragmatic hernia without obstruction or gangrene: Secondary | ICD-10-CM | POA: Diagnosis not present

## 2019-08-14 DIAGNOSIS — R1013 Epigastric pain: Secondary | ICD-10-CM | POA: Diagnosis not present

## 2019-08-14 DIAGNOSIS — K5730 Diverticulosis of large intestine without perforation or abscess without bleeding: Secondary | ICD-10-CM | POA: Diagnosis not present

## 2019-08-14 DIAGNOSIS — D122 Benign neoplasm of ascending colon: Secondary | ICD-10-CM | POA: Diagnosis not present

## 2019-08-14 DIAGNOSIS — R6881 Early satiety: Secondary | ICD-10-CM | POA: Diagnosis not present

## 2019-08-14 LAB — HM COLONOSCOPY

## 2019-08-15 DIAGNOSIS — D122 Benign neoplasm of ascending colon: Secondary | ICD-10-CM | POA: Diagnosis not present

## 2019-08-18 DIAGNOSIS — H2513 Age-related nuclear cataract, bilateral: Secondary | ICD-10-CM | POA: Diagnosis not present

## 2019-08-18 DIAGNOSIS — H401122 Primary open-angle glaucoma, left eye, moderate stage: Secondary | ICD-10-CM | POA: Diagnosis not present

## 2019-08-18 DIAGNOSIS — H353231 Exudative age-related macular degeneration, bilateral, with active choroidal neovascularization: Secondary | ICD-10-CM | POA: Diagnosis not present

## 2019-08-18 DIAGNOSIS — Z9889 Other specified postprocedural states: Secondary | ICD-10-CM | POA: Diagnosis not present

## 2019-08-24 DIAGNOSIS — H353231 Exudative age-related macular degeneration, bilateral, with active choroidal neovascularization: Secondary | ICD-10-CM | POA: Diagnosis not present

## 2019-08-24 DIAGNOSIS — L719 Rosacea, unspecified: Secondary | ICD-10-CM | POA: Diagnosis not present

## 2019-08-24 DIAGNOSIS — L219 Seborrheic dermatitis, unspecified: Secondary | ICD-10-CM | POA: Diagnosis not present

## 2019-08-24 DIAGNOSIS — L814 Other melanin hyperpigmentation: Secondary | ICD-10-CM | POA: Diagnosis not present

## 2019-08-24 DIAGNOSIS — L821 Other seborrheic keratosis: Secondary | ICD-10-CM | POA: Diagnosis not present

## 2019-08-24 DIAGNOSIS — D225 Melanocytic nevi of trunk: Secondary | ICD-10-CM | POA: Diagnosis not present

## 2019-08-25 DIAGNOSIS — M4726 Other spondylosis with radiculopathy, lumbar region: Secondary | ICD-10-CM | POA: Diagnosis not present

## 2019-08-25 DIAGNOSIS — M25551 Pain in right hip: Secondary | ICD-10-CM | POA: Diagnosis not present

## 2019-08-25 DIAGNOSIS — M48062 Spinal stenosis, lumbar region with neurogenic claudication: Secondary | ICD-10-CM | POA: Diagnosis not present

## 2019-08-25 DIAGNOSIS — R030 Elevated blood-pressure reading, without diagnosis of hypertension: Secondary | ICD-10-CM | POA: Diagnosis not present

## 2019-08-25 DIAGNOSIS — M5136 Other intervertebral disc degeneration, lumbar region: Secondary | ICD-10-CM | POA: Diagnosis not present

## 2019-08-25 DIAGNOSIS — Z6831 Body mass index (BMI) 31.0-31.9, adult: Secondary | ICD-10-CM | POA: Diagnosis not present

## 2019-08-25 DIAGNOSIS — M5416 Radiculopathy, lumbar region: Secondary | ICD-10-CM | POA: Diagnosis not present

## 2019-08-28 ENCOUNTER — Other Ambulatory Visit: Payer: Self-pay

## 2019-08-29 DIAGNOSIS — M25551 Pain in right hip: Secondary | ICD-10-CM | POA: Diagnosis not present

## 2019-08-29 DIAGNOSIS — M5116 Intervertebral disc disorders with radiculopathy, lumbar region: Secondary | ICD-10-CM | POA: Diagnosis not present

## 2019-08-29 DIAGNOSIS — M1611 Unilateral primary osteoarthritis, right hip: Secondary | ICD-10-CM | POA: Diagnosis not present

## 2019-08-29 DIAGNOSIS — M48061 Spinal stenosis, lumbar region without neurogenic claudication: Secondary | ICD-10-CM | POA: Diagnosis not present

## 2019-08-29 DIAGNOSIS — M4726 Other spondylosis with radiculopathy, lumbar region: Secondary | ICD-10-CM | POA: Diagnosis not present

## 2019-09-04 ENCOUNTER — Other Ambulatory Visit: Payer: Self-pay | Admitting: Family Medicine

## 2019-09-04 NOTE — Telephone Encounter (Signed)
Copied from [place] [id number]. Topic: Quick Communication - Rx Refill/Question >> Sep 04, 2019 12:43 PM Mcneil, Ja-Kwan wrote: Medication: fluconazole (DIFLUCAN) 100 MG tablet  Has the patient contacted their pharmacy? yes   Preferred Pharmacy (with phone number or street name): [id number] - [place], [place] [phone] (Phone)  [phone] (Fax)  Agent: Please be advised that RX refills may take up to 3 business days. We ask that you follow-up with your pharmacy.

## 2019-09-04 NOTE — Telephone Encounter (Signed)
Requested medication (s) are due for refill today: no  Requested medication (s) are on the active medication list: yes  Last refill:  04/19/2018  Future visit scheduled: yes  Notes to clinic: review for refill    Requested Prescriptions  Pending Prescriptions Disp Refills   fluconazole (DIFLUCAN) 100 MG tablet 6 tablet 2    Sig: 1 tab po daily x 3 days can repeat in 1 week     Off-Protocol Failed - 09/04/2019  1:24 PM      Failed - Medication not assigned to a protocol, review manually.      Failed - Valid encounter within last 12 months    Recent Outpatient Visits          1 year ago Leukocytosis, unspecified type   [facility] [place], MD   1 year ago Hypokalemia   [facility] [place], MD   1 year ago Renal insufficiency   [facility] [place], MD   1 year ago Preventative health care   [facility] [place], [place]   2 years ago Needs flu shot   [facility] [place], MD      Future Appointments            In 1 week [name], MD [place] [facility] [place]

## 2019-09-05 MED ORDER — FLUCONAZOLE 100 MG PO TABS: ORAL_TABLET | ORAL | 2 refills | Status: DC

## 2019-09-07 DIAGNOSIS — R030 Elevated blood-pressure reading, without diagnosis of hypertension: Secondary | ICD-10-CM | POA: Diagnosis not present

## 2019-09-07 DIAGNOSIS — Z6831 Body mass index (BMI) 31.0-31.9, adult: Secondary | ICD-10-CM | POA: Diagnosis not present

## 2019-09-07 DIAGNOSIS — M5416 Radiculopathy, lumbar region: Secondary | ICD-10-CM | POA: Diagnosis not present

## 2019-09-07 DIAGNOSIS — M5136 Other intervertebral disc degeneration, lumbar region: Secondary | ICD-10-CM | POA: Diagnosis not present

## 2019-09-07 DIAGNOSIS — M4726 Other spondylosis with radiculopathy, lumbar region: Secondary | ICD-10-CM | POA: Diagnosis not present

## 2019-09-07 DIAGNOSIS — M48062 Spinal stenosis, lumbar region with neurogenic claudication: Secondary | ICD-10-CM | POA: Diagnosis not present

## 2019-09-11 DIAGNOSIS — M4726 Other spondylosis with radiculopathy, lumbar region: Secondary | ICD-10-CM | POA: Diagnosis not present

## 2019-09-11 DIAGNOSIS — M5136 Other intervertebral disc degeneration, lumbar region: Secondary | ICD-10-CM | POA: Diagnosis not present

## 2019-09-11 DIAGNOSIS — M48061 Spinal stenosis, lumbar region without neurogenic claudication: Secondary | ICD-10-CM | POA: Diagnosis not present

## 2019-09-13 ENCOUNTER — Other Ambulatory Visit: Payer: Self-pay

## 2019-09-14 ENCOUNTER — Encounter: Payer: Self-pay | Admitting: Family Medicine

## 2019-09-14 ENCOUNTER — Ambulatory Visit (INDEPENDENT_AMBULATORY_CARE_PROVIDER_SITE_OTHER): Payer: Medicare Other | Admitting: Family Medicine

## 2019-09-14 ENCOUNTER — Other Ambulatory Visit: Payer: Self-pay

## 2019-09-14 VITALS — BP 142/70 | HR 61 | Temp 98.8°F | Resp 18 | Wt 184.8 lb

## 2019-09-14 DIAGNOSIS — R7989 Other specified abnormal findings of blood chemistry: Secondary | ICD-10-CM | POA: Diagnosis not present

## 2019-09-14 DIAGNOSIS — R739 Hyperglycemia, unspecified: Secondary | ICD-10-CM

## 2019-09-14 DIAGNOSIS — E559 Vitamin D deficiency, unspecified: Secondary | ICD-10-CM | POA: Diagnosis not present

## 2019-09-14 DIAGNOSIS — E782 Mixed hyperlipidemia: Secondary | ICD-10-CM

## 2019-09-14 DIAGNOSIS — J449 Chronic obstructive pulmonary disease, unspecified: Secondary | ICD-10-CM

## 2019-09-14 DIAGNOSIS — I10 Essential (primary) hypertension: Secondary | ICD-10-CM | POA: Diagnosis not present

## 2019-09-14 DIAGNOSIS — I5032 Chronic diastolic (congestive) heart failure: Secondary | ICD-10-CM

## 2019-09-14 DIAGNOSIS — I480 Paroxysmal atrial fibrillation: Secondary | ICD-10-CM

## 2019-09-14 DIAGNOSIS — K5909 Other constipation: Secondary | ICD-10-CM | POA: Diagnosis not present

## 2019-09-14 DIAGNOSIS — Z0000 Encounter for general adult medical examination without abnormal findings: Secondary | ICD-10-CM

## 2019-09-14 LAB — LIPID PANEL
Cholesterol: 254 mg/dL — ABNORMAL HIGH (ref 0–200)
HDL: 45.5 mg/dL (ref >39.00)
LDL Cholesterol: 177 mg/dL — ABNORMAL HIGH (ref 0–99)
NonHDL: 208.87
Total CHOL/HDL Ratio: 6
Triglycerides: 159 mg/dL — ABNORMAL HIGH (ref 0.0–149.0)
VLDL: 31.8 mg/dL (ref 0.0–40.0)

## 2019-09-14 LAB — COMPREHENSIVE METABOLIC PANEL
ALT: 16 U/L (ref 0–53)
AST: 17 U/L (ref 0–37)
Albumin: 4.3 g/dL (ref 3.5–5.2)
Alkaline Phosphatase: 47 U/L (ref 39–117)
BUN: 32 mg/dL — ABNORMAL HIGH (ref 6–23)
CO2: 26 mEq/L (ref 19–32)
Calcium: 9.2 mg/dL (ref 8.4–10.5)
Chloride: 103 mEq/L (ref 96–112)
Creatinine, Ser: 1.27 mg/dL (ref 0.40–1.50)
GFR: 55.64 mL/min — ABNORMAL LOW (ref >60.00)
Glucose, Bld: 91 mg/dL (ref 70–99)
Potassium: 4.5 mEq/L (ref 3.5–5.1)
Sodium: 139 mEq/L (ref 135–145)
Total Bilirubin: 0.5 mg/dL (ref 0.2–1.2)
Total Protein: 7 g/dL (ref 6.0–8.3)

## 2019-09-14 LAB — HEMOGLOBIN A1C: Hgb A1c MFr Bld: 4.9 % (ref 4.6–6.5)

## 2019-09-14 LAB — CBC
HCT: 40.3 % (ref 39.0–52.0)
Hemoglobin: 13.4 g/dL (ref 13.0–17.0)
MCHC: 33.2 g/dL (ref 30.0–36.0)
MCV: 93.2 fl (ref 78.0–100.0)
Platelets: 212 10*3/uL (ref 150.0–400.0)
RBC: 4.33 Mil/uL (ref 4.22–5.81)
RDW: 13.1 % (ref 11.5–15.5)
WBC: 13.2 10*3/uL — ABNORMAL HIGH (ref 4.0–10.5)

## 2019-09-14 LAB — TSH: TSH: 2.37 u[IU]/mL (ref 0.35–4.50)

## 2019-09-14 LAB — VITAMIN D 25 HYDROXY (VIT D DEFICIENCY, FRACTURES): VITD: 41.05 ng/mL (ref 30.00–100.00)

## 2019-09-14 MED ORDER — ALBUTEROL SULFATE HFA 108 (90 BASE) MCG/ACT IN AERS: 1.0000 | INHALATION_SPRAY | Freq: Four times a day (QID) | RESPIRATORY_TRACT | 2 refills | Status: DC | PRN

## 2019-09-14 NOTE — Assessment & Plan Note (Signed)
Doing Lasix daily and potassium every other day

## 2019-09-14 NOTE — Assessment & Plan Note (Signed)
Worse SOB with exertion. Albuterol prn

## 2019-09-14 NOTE — Assessment & Plan Note (Addendum)
Rate controlled, tolerating meds, following with cardiology

## 2019-09-14 NOTE — Patient Instructions (Signed)
Preventive Care 72 Years and Older, Male Preventive care refers to lifestyle choices and visits with your health care provider that can promote health and wellness. This includes:  A yearly physical exam. This is also called an annual well check.  Regular dental and eye exams.  Immunizations.  Screening for certain conditions.  Healthy lifestyle choices, such as diet and exercise. What can I expect for my preventive care visit? Physical exam Your health care provider will check:  Height and weight. These may be used to calculate body mass index (BMI), which is a measurement that tells if you are at a healthy weight.  Heart rate and blood pressure.  Your skin for abnormal spots. Counseling Your health care provider may ask you questions about:  Alcohol, tobacco, and drug use.  Emotional well-being.  Home and relationship well-being.  Sexual activity.  Eating habits.  History of falls.  Memory and ability to understand (cognition).  Work and work Statistician. What immunizations do I need?  Influenza (flu) vaccine  This is recommended every year. Tetanus, diphtheria, and pertussis (Tdap) vaccine  You may need a Td booster every 10 years. Varicella (chickenpox) vaccine  You may need this vaccine if you have not already been vaccinated. Zoster (shingles) vaccine  You may need this after age 72. Pneumococcal conjugate (PCV13) vaccine  One dose is recommended after age 72. Pneumococcal polysaccharide (PPSV23) vaccine  One dose is recommended after age 72. Measles, mumps, and rubella (MMR) vaccine  You may need at least one dose of MMR if you were born in 1957 or later. You may also need a second dose. Meningococcal conjugate (MenACWY) vaccine  You may need this if you have certain conditions. Hepatitis A vaccine  You may need this if you have certain conditions or if you travel or work in places where you may be exposed to hepatitis A. Hepatitis B vaccine   You may need this if you have certain conditions or if you travel or work in places where you may be exposed to hepatitis B. Haemophilus influenzae type b (Hib) vaccine  You may need this if you have certain conditions. You may receive vaccines as individual doses or as more than one vaccine together in one shot (combination vaccines). Talk with your health care provider about the risks and benefits of combination vaccines. What tests do I need? Blood tests  Lipid and cholesterol levels. These may be checked every 5 years, or more frequently depending on your overall health.  Hepatitis C test.  Hepatitis B test. Screening  Lung cancer screening. You may have this screening every year starting at age 72 if you have a 30-pack-year history of smoking and currently smoke or have quit within the past 15 years.  Colorectal cancer screening. All adults should have this screening starting at age 72 and continuing until age 72. Your health care provider may recommend screening at age 72 if you are at increased risk. You will have tests every 1-10 years, depending on your results and the type of screening test.  Prostate cancer screening. Recommendations will vary depending on your family history and other risks.  Diabetes screening. This is done by checking your blood sugar (glucose) after you have not eaten for a while (fasting). You may have this done every 1-3 years.  Abdominal aortic aneurysm (AAA) screening. You may need this if you are a current or former smoker.  Sexually transmitted disease (STD) testing. Follow these instructions at home: Eating and drinking  Eat  a diet that includes fresh fruits and vegetables, whole grains, lean protein, and low-fat dairy products. Limit your intake of foods with high amounts of sugar, saturated fats, and salt.  Take vitamin and mineral supplements as recommended by your health care provider.  Do not drink alcohol if your health care provider  tells you not to drink.  If you drink alcohol: ? Limit how much you have to 0-2 drinks a day. ? Be aware of how much alcohol is in your drink. In the [place] one drink equals one 12 oz bottle of beer (355 mL), one 5 oz glass of wine (148 mL), or one 1 oz glass of hard liquor (44 mL). Lifestyle  Take daily care of your teeth and gums.  Stay active. Exercise for at least 30 minutes on 5 or more days each week.  Do not use any products that contain nicotine or tobacco, such as cigarettes, e-cigarettes, and chewing tobacco. If you need help quitting, ask your health care provider.  If you are sexually active, practice safe sex. Use a condom or other form of protection to prevent STIs (sexually transmitted infections).  Talk with your health care provider about taking a low-dose aspirin or statin. What's next?  Visit your health care provider once a year for a well check visit.  Ask your health care provider how often you should have your eyes and teeth checked.  Stay up to date on all vaccines. This information is not intended to replace advice given to you by your health care provider. Make sure you discuss any questions you have with your health care provider. Document Released: 10/18/2015 Document Revised: 09/15/2018 Document Reviewed: 09/15/2018 Elsevier Patient Education  2020 [employer]  

## 2019-09-14 NOTE — Progress Notes (Signed)
Subjective:    Patient ID: Ryan [name], male    DOB: 12-06-1945, 73 y.o.   MRN: [id number]  No chief complaint on file.   HPI Patient is in today for annual preventative exam and follow up on chronic medical concerns including atrial fibrillation, hypertension, hyperlipidemia and more. He reports he is doing well. No recent febrile illness or hospitalizations. No polyuria or polydipsia. He has had trouble with back pain but he got a shot from his neurosurgeon, Dr Sherwood Gambler and his back is feeling better. Denies CP/palp/SOB/HA/congestion/fevers or GU c/o. Taking meds as prescribed. He has recently had to have some lesions removed when he had an increase in abdominal pain, his pain is all better now. He is maintaining quarantine well and is trying to stay active.   Past Medical History:  Diagnosis Date   A-fib ([place]) 03/22/2018   Anemia    Atrial flutter ([place]) 03/31/2012   converted spontaneously to sinus   CAD (coronary artery disease)    reportedly moderate CAD; managed medically   COPD (chronic obstructive pulmonary disease) ([place])    Eczema 08/03/2014   Edema 03/22/2018   GERD (gastroesophageal reflux disease)    H/O: rheumatic fever    Heart murmur    Heart murmur    High risk medication use    on amiodarone since 03/31/2012   History of colonoscopy    Hyperglycemia 06/21/2017   Hyperlipidemia    Hypertension    Kidney stone 06/21/2017   Kidney stones    Lobar pneumonia ([place]) 03/22/2018   Low vitamin D level 06/21/2017   Macular degeneration    Osteoporosis 05/31/2016    Past Surgical History:  Procedure Laterality Date   APPENDECTOMY     CARDIAC CATHETERIZATION  04/22/2011   moderate left main and RCA stenosis not significant by FFR and IVUS on medical therapy   CARDIOVERSION N/A 04/25/2018   Procedure: CARDIOVERSION;  Surgeon: Skeet Latch, MD;  Location: [facility];  Service: Cardiovascular;  Laterality: N/A;   CERVICAL DISCECTOMY     C5,6,7 disc fused  with plate and 5 1" screws   CHOLECYSTECTOMY     colon polyp removal  3/1.19   FOOT SURGERY     right calcification removed from top of foot   knee cartiledge Right    right knee   MOUTH SURGERY     periodontal surgery, bridges, splint in front,     Family History  Problem Relation Age of Onset   Heart disease Mother    Diabetes Mother    Cirrhosis Mother    Emphysema Mother        never smoked but 2nd hand through her spouse   Hypertension Mother    Macular degeneration Mother    Heart disease Father    Cancer Father        prostate   Hyperlipidemia Father    Hypertension Father    Varicose Veins Father    Heart attack Father    Peripheral vascular disease Father    Heart disease Sister    Arthritis Sister    Hyperlipidemia Sister    Obesity Sister    Macular degeneration Sister    Heart disease Brother        5 stents   Hyperlipidemia Brother    Macular degeneration Maternal Grandfather    Cirrhosis Sister    Obesity Sister    Arthritis Sister    Heart disease Sister    Obesity Sister    Liver disease  Other    Prostate cancer Other    Coronary artery disease Other     Social History   Socioeconomic History   Marital status: Married    Spouse name: Not on file   Number of children: 0   Years of education: Not on file   Highest education level: Not on file  Occupational History   Occupation: retired    Fish farm manager: RETIRED  Tobacco Use   Smoking status: Former Smoker    Packs/day: 0.50    Years: 50.00    Pack years: 25.00    Types: Cigarettes    Quit date: 05/01/2013    Years since quitting: 6.3   Smokeless tobacco: Former Systems developer   Tobacco comment: using e-Cig//ldc  Substance and Sexual Activity   Alcohol use: No   Drug use: No   Sexual activity: Yes    Comment: lives with wife, no dietary restrictions  Other Topics Concern   Not on file  Social History Narrative   Not on file    Social Determinants of Health   Financial Resource Strain:    Difficulty of Paying Living Expenses: Not on file  Food Insecurity:    Worried About Charity fundraiser in the Last Year: Not on file   [employer] of Food in the Last Year: Not on file  Transportation Needs:    Lack of Transportation (Medical): Not on file   Lack of Transportation (Non-Medical): Not on file  Physical Activity:    Days of Exercise per Week: Not on file   Minutes of Exercise per Session: Not on file  Stress:    Feeling of Stress : Not on file  Social Connections:    Frequency of Communication with Friends and Family: Not on file   Frequency of Social Gatherings with Friends and Family: Not on file   Attends Religious Services: Not on file   Active Member of Clubs or Organizations: Not on file   Attends Archivist Meetings: Not on file   Marital Status: Not on file  Intimate Partner Violence:    Fear of Current or Ex-Partner: Not on file   Emotionally Abused: Not on file   Physically Abused: Not on file   Sexually Abused: Not on file    Outpatient Medications Prior to Visit  Medication Sig Dispense Refill   amoxicillin (AMOXIL) 500 MG capsule Take 2,000 mg by mouth See admin instructions. Take 2000 mg 1 hour prior to dental work     brimonidine (ALPHAGAN) 0.2 % ophthalmic solution Instill 1 drop into the left eye twice daily  7   Cholecalciferol (VITAMIN D3) 5000 units CAPS Take 5,000 Units by mouth every Monday, Wednesday, and Friday.     clobetasol (TEMOVATE) 0.05 % external solution APPLY 1 APPLICATION TOPICALLY TWICE DAILY (Patient taking differently: Apply 1 application topically 2 (two) times daily as needed (psoriasis). ) 100 mL 3   ELIQUIS 5 MG TABS tablet TAKE 1 TABLET BY MOUTH TWO  TIMES DAILY 180 tablet 3   fluconazole (DIFLUCAN) 100 MG tablet 1 tab po daily x 3 days can repeat in 1 week 6 tablet 2   furosemide (LASIX) 20 MG tablet TAKE 1 TABLET BY MOUTH   DAILY 90 tablet 3   latanoprost (XALATAN) 0.005 % ophthalmic solution Place 1 drop into the left eye at bedtime.      metoprolol tartrate (LOPRESSOR) 25 MG tablet TAKE 1 TABLET BY MOUTH  TWICE DAILY 180 tablet 3   Multiple Vitamins-Minerals (  ICAPS MV) TABS Take 1 tablet by mouth 2 (two) times daily.      omeprazole (PRILOSEC OTC) 20 MG tablet Take 20 mg by mouth daily.     potassium chloride (KLOR-CON) 10 MEQ tablet TAKE 1 TABLET BY MOUTH  DAILY 90 tablet 3   propafenone (RYTHMOL) 225 MG tablet TAKE 1 TABLET BY MOUTH TWO  TIMES DAILY 180 tablet 2   Ranibizumab (LUCENTIS IO) Inject 1 Dose into the eye every 3 (three) months. (Macular Degeneration)     tamsulosin (FLOMAX) 0.4 MG CAPS capsule Take 1 capsule (0.4 mg total) by mouth daily. (Patient taking differently: Take 0.4 mg by mouth at bedtime. ) 30 capsule 0   albuterol (VENTOLIN HFA) 108 (90 Base) MCG/ACT inhaler Inhale 1-2 puffs into the lungs every 6 (six) hours as needed for wheezing or shortness of breath. 18 g 2   No facility-administered medications prior to visit.    Allergies  Allergen Reactions   Mucinex D [Pseudoephedrine-Guaifenesin Er] Anaphylaxis    Stomach cramps   Polymyxin B-Trimethoprim Swelling    Eye drops made eyes swell   Codeine Hives, Itching and Rash   Statins Other (See Comments)    Muscle cramps    Pseudoephedrine-Guaifenesin Nausea And Vomiting    Stomach cramps   Tapentadol Other (See Comments)    unknown    Ciprofloxacin Hives, Itching, Nausea Only and Rash   Moxifloxacin Nausea Only and Other (See Comments)    Stomach cramps   Rofecoxib     Stomach cramping     Review of Systems  Constitutional: Negative for chills, fever and malaise/fatigue.  HENT: Negative for congestion and hearing loss.   Eyes: Negative for discharge.  Respiratory: Negative for cough, sputum production and shortness of breath.   Cardiovascular: Negative for chest pain, palpitations and leg swelling.   Gastrointestinal: Negative for abdominal pain, blood in stool, constipation, diarrhea, heartburn, nausea and vomiting.  Genitourinary: Negative for dysuria, frequency, hematuria and urgency.  Musculoskeletal: Negative for back pain, falls and myalgias.  Skin: Negative for rash.  Neurological: Negative for dizziness, sensory change, loss of consciousness, weakness and headaches.  Endo/Heme/Allergies: Negative for environmental allergies. Does not bruise/bleed easily.  Psychiatric/Behavioral: Negative for depression and suicidal ideas. The patient is not nervous/anxious and does not have insomnia.        Objective:    Physical Exam Vitals and nursing note reviewed.  Constitutional:      General: He is not in acute distress.    Appearance: He is well-developed.  HENT:     Head: Normocephalic and atraumatic.     Nose: Nose normal.  Eyes:     General:        Right eye: No discharge.        Left eye: No discharge.  Cardiovascular:     Rate and Rhythm: Normal rate and regular rhythm.     Heart sounds: No murmur.  Pulmonary:     Effort: Pulmonary effort is normal.     Breath sounds: Normal breath sounds.  Abdominal:     General: Bowel sounds are normal.     Palpations: Abdomen is soft.     Tenderness: There is no abdominal tenderness.  Musculoskeletal:     Cervical back: Normal range of motion and neck supple.  Skin:    General: Skin is warm and dry.  Neurological:     Mental Status: He is alert and oriented to person, place, and time.     BP (!) 142/70 (  BP Location: Left Arm, Patient Position: Sitting, Cuff Size: Normal)    Pulse 61    Temp 98.8 F (37.1 C) (Temporal)    Resp 18    Wt 184 lb 12.8 oz (83.8 kg)    SpO2 98%    BMI 31.72 kg/m  Wt Readings from Last 3 Encounters:  09/14/19 184 lb 12.8 oz (83.8 kg)  06/30/19 176 lb 3.2 oz (79.9 kg)  07/28/18 176 lb 6.4 oz (80 kg)    Diabetic Foot Exam - Simple   No data filed     Lab Results  Component Value Date   WBC  13.2 (H) 09/14/2019   HGB 13.4 09/14/2019   HCT 40.3 09/14/2019   PLT 212.0 09/14/2019   GLUCOSE 91 09/14/2019   CHOL 254 (H) 09/14/2019   TRIG 159.0 (H) 09/14/2019   HDL 45.50 09/14/2019   LDLDIRECT 134.2 12/01/2013   LDLCALC 177 (H) 09/14/2019   ALT 16 09/14/2019   AST 17 09/14/2019   NA 139 09/14/2019   K 4.5 09/14/2019   CL 103 09/14/2019   CREATININE 1.27 09/14/2019   BUN 32 (H) 09/14/2019   CO2 26 09/14/2019   TSH 2.37 09/14/2019   PSA 1.64 01/14/2015   INR 1.17 04/01/2012   HGBA1C 4.9 09/14/2019    Lab Results  Component Value Date   TSH 2.37 09/14/2019   Lab Results  Component Value Date   WBC 13.2 (H) 09/14/2019   HGB 13.4 09/14/2019   HCT 40.3 09/14/2019   MCV 93.2 09/14/2019   PLT 212.0 09/14/2019   Lab Results  Component Value Date   NA 139 09/14/2019   K 4.5 09/14/2019   CO2 26 09/14/2019   GLUCOSE 91 09/14/2019   BUN 32 (H) 09/14/2019   CREATININE 1.27 09/14/2019   BILITOT 0.5 09/14/2019   ALKPHOS 47 09/14/2019   AST 17 09/14/2019   ALT 16 09/14/2019   PROT 7.0 09/14/2019   ALBUMIN 4.3 09/14/2019   CALCIUM 9.2 09/14/2019   ANIONGAP 14 03/25/2018   GFR 55.64 (L) 09/14/2019   Lab Results  Component Value Date   CHOL 254 (H) 09/14/2019   Lab Results  Component Value Date   HDL 45.50 09/14/2019   Lab Results  Component Value Date   LDLCALC 177 (H) 09/14/2019   Lab Results  Component Value Date   TRIG 159.0 (H) 09/14/2019   Lab Results  Component Value Date   CHOLHDL 6 09/14/2019   Lab Results  Component Value Date   HGBA1C 4.9 09/14/2019       Assessment & Plan:   Problem List Items Addressed This Visit    Hyperlipidemia, mixed - Primary    Encouraged heart healthy diet, increase exercise, avoid trans fats, consider a krill oil cap daily      Relevant Orders   Lipid panel (Completed)   Essential hypertension    Well controlled, no changes to meds. Encouraged heart healthy diet such as the DASH diet and exercise as  tolerated.       Relevant Orders   CBC (Completed)   TSH (Completed)   CMP (Completed)   COPD GOLD I    Worse SOB with exertion. Albuterol prn      Relevant Medications   albuterol (VENTOLIN HFA) 108 (90 Base) MCG/ACT inhaler   Constipation    Follows with Dr Allyn Kenner, and he had a recent colonoscopy due to increased cramping in lower abd and luq pain since having a partial blockage from a growth  in colon was removed symptoms are better      Preventative health care    Patient encouraged to maintain heart healthy diet, regular exercise, adequate sleep. Consider daily probiotics. Take medications as prescribed. Labs reviewed. Immunizations utd. Colonoscopy up to date. Colonoscopy recenly, Dr Earlean Shawl Pulmonology Dr Christinia Gully Cardiology, Dr Crissie Sickles, Dr Jenkins [facility], Dr Cordelia Pen      Low vitamin D level    Supplement and monitor      Relevant Orders   VITAMIN D (Completed)   Hyperglycemia    hgba1c acceptable, minimize simple carbs. Increase exercise as tolerated. Continue current meds      Relevant Orders   A1C (Completed)   CMP (Completed)   CHF (congestive heart failure) (HCC)    Doing Lasix daily and potassium every other day      A-fib ([place])    Rate controlled, tolerating meds, following with cardiology      Vitamin D deficiency   Relevant Orders   VITAMIN D (Completed)      I am having Chadric L. Carlota Raspberry "L" maintain his ICaps MV, omeprazole, Ranibizumab (LUCENTIS IO), latanoprost, clobetasol, tamsulosin, brimonidine, amoxicillin, Vitamin D3, propafenone, Eliquis, metoprolol tartrate, furosemide, potassium chloride, fluconazole, and albuterol.  Meds ordered this encounter  Medications   albuterol (VENTOLIN HFA) 108 (90 Base) MCG/ACT inhaler    Sig: Inhale 1-2 puffs into the lungs every 6 (six) hours as needed for wheezing or shortness of breath.    Dispense:  18 g    Refill:  2     Penni Homans, MD

## 2019-09-14 NOTE — Assessment & Plan Note (Signed)
Supplement and monitor 

## 2019-09-14 NOTE — Assessment & Plan Note (Signed)
hgba1c acceptable, minimize simple carbs. Increase exercise as tolerated. Continue current meds 

## 2019-09-14 NOTE — Assessment & Plan Note (Signed)
Follows with Dr Allyn Kenner, and he had a recent colonoscopy due to increased cramping in lower abd and luq pain since having a partial blockage from a growth in colon was removed symptoms are better

## 2019-09-14 NOTE — Assessment & Plan Note (Signed)
Dr Daiva Huge

## 2019-09-14 NOTE — Assessment & Plan Note (Signed)
Well controlled, no changes to meds. Encouraged heart healthy diet such as the DASH diet and exercise as tolerated.  °

## 2019-09-18 DIAGNOSIS — E559 Vitamin D deficiency, unspecified: Secondary | ICD-10-CM | POA: Insufficient documentation

## 2019-09-18 NOTE — Assessment & Plan Note (Addendum)
Patient encouraged to maintain heart healthy diet, regular exercise, adequate sleep. Consider daily probiotics. Take medications as prescribed. Labs reviewed. Immunizations utd. Colonoscopy up to date. Colonoscopy recenly, Dr Earlean Shawl Pulmonology Dr Christinia Gully [facility], Dr Crissie Sickles, Dr Jenkins Rouge [facility], Dr Cordelia Pen

## 2019-09-18 NOTE — Assessment & Plan Note (Signed)
Encouraged heart healthy diet, increase exercise, avoid trans fats, consider a krill oil cap daily 

## 2019-09-21 ENCOUNTER — Telehealth: Payer: Self-pay

## 2019-09-21 ENCOUNTER — Other Ambulatory Visit: Payer: Self-pay

## 2019-09-21 NOTE — Telephone Encounter (Signed)
error 

## 2019-09-22 ENCOUNTER — Telehealth: Payer: Self-pay | Admitting: Internal Medicine

## 2019-09-22 NOTE — Telephone Encounter (Signed)
Patient states he is returning nurse Kayla's call.

## 2019-09-22 NOTE — Telephone Encounter (Signed)
Looks like notes from [place], [place] was in error, see note. I will route call to Dr. Tanna Furry nurse Clent Ridges RN for further follow up.

## 2019-09-24 ENCOUNTER — Other Ambulatory Visit: Payer: Self-pay | Admitting: Internal Medicine

## 2019-09-24 DIAGNOSIS — I4891 Unspecified atrial fibrillation: Secondary | ICD-10-CM

## 2019-10-06 DIAGNOSIS — Z9841 Cataract extraction status, right eye: Secondary | ICD-10-CM

## 2019-10-06 DIAGNOSIS — Z9842 Cataract extraction status, left eye: Secondary | ICD-10-CM

## 2019-10-06 HISTORY — DX: Cataract extraction status, left eye: Z98.41

## 2019-10-06 HISTORY — PX: CATARACT EXTRACTION W/ INTRAOCULAR LENS IMPLANT: SHX1309

## 2019-10-06 HISTORY — DX: Cataract extraction status, right eye: Z98.42

## 2019-10-12 DIAGNOSIS — H25813 Combined forms of age-related cataract, bilateral: Secondary | ICD-10-CM | POA: Diagnosis not present

## 2019-10-12 DIAGNOSIS — Z01818 Encounter for other preprocedural examination: Secondary | ICD-10-CM | POA: Diagnosis not present

## 2019-10-12 DIAGNOSIS — Z20822 Contact with and (suspected) exposure to covid-19: Secondary | ICD-10-CM | POA: Diagnosis not present

## 2019-10-12 DIAGNOSIS — Z20828 Contact with and (suspected) exposure to other viral communicable diseases: Secondary | ICD-10-CM | POA: Diagnosis not present

## 2019-10-19 DIAGNOSIS — H25813 Combined forms of age-related cataract, bilateral: Secondary | ICD-10-CM | POA: Diagnosis not present

## 2019-10-19 DIAGNOSIS — J449 Chronic obstructive pulmonary disease, unspecified: Secondary | ICD-10-CM | POA: Diagnosis not present

## 2019-10-19 DIAGNOSIS — H353231 Exudative age-related macular degeneration, bilateral, with active choroidal neovascularization: Secondary | ICD-10-CM | POA: Diagnosis not present

## 2019-10-19 DIAGNOSIS — H25812 Combined forms of age-related cataract, left eye: Secondary | ICD-10-CM | POA: Diagnosis not present

## 2019-10-20 ENCOUNTER — Encounter: Payer: Self-pay | Admitting: Family Medicine

## 2019-10-20 DIAGNOSIS — H2511 Age-related nuclear cataract, right eye: Secondary | ICD-10-CM | POA: Diagnosis not present

## 2019-10-20 DIAGNOSIS — Z48810 Encounter for surgical aftercare following surgery on the sense organs: Secondary | ICD-10-CM | POA: Diagnosis not present

## 2019-10-20 DIAGNOSIS — Z79899 Other long term (current) drug therapy: Secondary | ICD-10-CM | POA: Diagnosis not present

## 2019-10-20 DIAGNOSIS — Z961 Presence of intraocular lens: Secondary | ICD-10-CM | POA: Diagnosis not present

## 2019-10-26 DIAGNOSIS — H353231 Exudative age-related macular degeneration, bilateral, with active choroidal neovascularization: Secondary | ICD-10-CM | POA: Diagnosis not present

## 2019-11-01 DIAGNOSIS — H2511 Age-related nuclear cataract, right eye: Secondary | ICD-10-CM | POA: Diagnosis not present

## 2019-11-01 DIAGNOSIS — H401120 Primary open-angle glaucoma, left eye, stage unspecified: Secondary | ICD-10-CM | POA: Diagnosis not present

## 2019-11-01 DIAGNOSIS — Z961 Presence of intraocular lens: Secondary | ICD-10-CM | POA: Diagnosis not present

## 2019-11-01 DIAGNOSIS — Z79899 Other long term (current) drug therapy: Secondary | ICD-10-CM | POA: Diagnosis not present

## 2019-11-01 DIAGNOSIS — Z48810 Encounter for surgical aftercare following surgery on the sense organs: Secondary | ICD-10-CM | POA: Diagnosis not present

## 2019-12-04 DIAGNOSIS — H353231 Exudative age-related macular degeneration, bilateral, with active choroidal neovascularization: Secondary | ICD-10-CM | POA: Diagnosis not present

## 2019-12-04 DIAGNOSIS — Z79899 Other long term (current) drug therapy: Secondary | ICD-10-CM | POA: Diagnosis not present

## 2019-12-04 DIAGNOSIS — Z48810 Encounter for surgical aftercare following surgery on the sense organs: Secondary | ICD-10-CM | POA: Diagnosis not present

## 2019-12-04 DIAGNOSIS — H2511 Age-related nuclear cataract, right eye: Secondary | ICD-10-CM | POA: Diagnosis not present

## 2019-12-04 DIAGNOSIS — H401122 Primary open-angle glaucoma, left eye, moderate stage: Secondary | ICD-10-CM | POA: Diagnosis not present

## 2019-12-04 DIAGNOSIS — Z961 Presence of intraocular lens: Secondary | ICD-10-CM | POA: Diagnosis not present

## 2019-12-07 DIAGNOSIS — Z961 Presence of intraocular lens: Secondary | ICD-10-CM | POA: Diagnosis not present

## 2019-12-07 DIAGNOSIS — H353231 Exudative age-related macular degeneration, bilateral, with active choroidal neovascularization: Secondary | ICD-10-CM | POA: Diagnosis not present

## 2019-12-07 DIAGNOSIS — Z9842 Cataract extraction status, left eye: Secondary | ICD-10-CM | POA: Diagnosis not present

## 2019-12-07 DIAGNOSIS — H2511 Age-related nuclear cataract, right eye: Secondary | ICD-10-CM | POA: Diagnosis not present

## 2019-12-07 DIAGNOSIS — H401122 Primary open-angle glaucoma, left eye, moderate stage: Secondary | ICD-10-CM | POA: Diagnosis not present

## 2019-12-08 DIAGNOSIS — M5416 Radiculopathy, lumbar region: Secondary | ICD-10-CM | POA: Diagnosis not present

## 2019-12-08 DIAGNOSIS — M4726 Other spondylosis with radiculopathy, lumbar region: Secondary | ICD-10-CM | POA: Diagnosis not present

## 2019-12-08 DIAGNOSIS — M48062 Spinal stenosis, lumbar region with neurogenic claudication: Secondary | ICD-10-CM | POA: Diagnosis not present

## 2019-12-08 DIAGNOSIS — M5136 Other intervertebral disc degeneration, lumbar region: Secondary | ICD-10-CM | POA: Diagnosis not present

## 2019-12-18 ENCOUNTER — Other Ambulatory Visit: Payer: Self-pay | Admitting: Emergency Medicine

## 2019-12-18 DIAGNOSIS — Z9889 Other specified postprocedural states: Secondary | ICD-10-CM | POA: Diagnosis not present

## 2019-12-18 DIAGNOSIS — H401122 Primary open-angle glaucoma, left eye, moderate stage: Secondary | ICD-10-CM | POA: Diagnosis not present

## 2019-12-18 DIAGNOSIS — I10 Essential (primary) hypertension: Secondary | ICD-10-CM

## 2019-12-19 ENCOUNTER — Other Ambulatory Visit (INDEPENDENT_AMBULATORY_CARE_PROVIDER_SITE_OTHER): Payer: Medicare Other

## 2019-12-19 ENCOUNTER — Other Ambulatory Visit: Payer: Self-pay

## 2019-12-19 ENCOUNTER — Encounter: Payer: Self-pay | Admitting: Family Medicine

## 2019-12-19 DIAGNOSIS — I10 Essential (primary) hypertension: Secondary | ICD-10-CM

## 2019-12-19 DIAGNOSIS — Z789 Other specified health status: Secondary | ICD-10-CM | POA: Insufficient documentation

## 2019-12-19 DIAGNOSIS — E782 Mixed hyperlipidemia: Secondary | ICD-10-CM

## 2019-12-19 LAB — CBC
HCT: 37.4 % — ABNORMAL LOW (ref 39.0–52.0)
Hemoglobin: 13 g/dL (ref 13.0–17.0)
MCHC: 34.8 g/dL (ref 30.0–36.0)
MCV: 92.1 fl (ref 78.0–100.0)
Platelets: 188 10*3/uL (ref 150.0–400.0)
RBC: 4.06 Mil/uL — ABNORMAL LOW (ref 4.22–5.81)
RDW: 13.8 % (ref 11.5–15.5)
WBC: 8.3 10*3/uL (ref 4.0–10.5)

## 2019-12-19 LAB — COMPREHENSIVE METABOLIC PANEL
ALT: 13 U/L (ref 0–53)
AST: 18 U/L (ref 0–37)
Albumin: 3.8 g/dL (ref 3.5–5.2)
Alkaline Phosphatase: 53 U/L (ref 39–117)
BUN: 17 mg/dL (ref 6–23)
CO2: 28 mEq/L (ref 19–32)
Calcium: 8.7 mg/dL (ref 8.4–10.5)
Chloride: 105 mEq/L (ref 96–112)
Creatinine, Ser: 1.2 mg/dL (ref 0.40–1.50)
GFR: 59.36 mL/min — ABNORMAL LOW (ref >60.00)
Glucose, Bld: 72 mg/dL (ref 70–99)
Potassium: 4.6 mEq/L (ref 3.5–5.1)
Sodium: 141 mEq/L (ref 135–145)
Total Bilirubin: 0.7 mg/dL (ref 0.2–1.2)
Total Protein: 6.5 g/dL (ref 6.0–8.3)

## 2019-12-19 LAB — LIPID PANEL
Cholesterol: 231 mg/dL — ABNORMAL HIGH (ref 0–200)
HDL: 37.8 mg/dL — ABNORMAL LOW (ref >39.00)
LDL Cholesterol: 168 mg/dL — ABNORMAL HIGH (ref 0–99)
NonHDL: 193.34
Total CHOL/HDL Ratio: 6
Triglycerides: 126 mg/dL (ref 0.0–149.0)
VLDL: 25.2 mg/dL (ref 0.0–40.0)

## 2019-12-21 NOTE — Addendum Note (Signed)
Addended byDamita Dunnings D on: 12/21/2019 02:27 PM   Modules accepted: Orders

## 2019-12-22 DIAGNOSIS — Z20822 Contact with and (suspected) exposure to covid-19: Secondary | ICD-10-CM | POA: Diagnosis not present

## 2019-12-22 DIAGNOSIS — H353211 Exudative age-related macular degeneration, right eye, with active choroidal neovascularization: Secondary | ICD-10-CM | POA: Diagnosis not present

## 2019-12-22 DIAGNOSIS — Z20828 Contact with and (suspected) exposure to other viral communicable diseases: Secondary | ICD-10-CM | POA: Diagnosis not present

## 2019-12-28 DIAGNOSIS — Z87891 Personal history of nicotine dependence: Secondary | ICD-10-CM | POA: Diagnosis not present

## 2019-12-28 DIAGNOSIS — H25811 Combined forms of age-related cataract, right eye: Secondary | ICD-10-CM | POA: Diagnosis not present

## 2019-12-28 DIAGNOSIS — J449 Chronic obstructive pulmonary disease, unspecified: Secondary | ICD-10-CM | POA: Diagnosis not present

## 2019-12-28 DIAGNOSIS — K219 Gastro-esophageal reflux disease without esophagitis: Secondary | ICD-10-CM | POA: Diagnosis not present

## 2019-12-28 DIAGNOSIS — H3530 Unspecified macular degeneration: Secondary | ICD-10-CM | POA: Diagnosis not present

## 2019-12-28 DIAGNOSIS — I4891 Unspecified atrial fibrillation: Secondary | ICD-10-CM | POA: Diagnosis not present

## 2019-12-28 DIAGNOSIS — I10 Essential (primary) hypertension: Secondary | ICD-10-CM | POA: Diagnosis not present

## 2020-01-11 DIAGNOSIS — M48061 Spinal stenosis, lumbar region without neurogenic claudication: Secondary | ICD-10-CM | POA: Diagnosis not present

## 2020-01-11 DIAGNOSIS — M4726 Other spondylosis with radiculopathy, lumbar region: Secondary | ICD-10-CM | POA: Diagnosis not present

## 2020-01-11 DIAGNOSIS — M5136 Other intervertebral disc degeneration, lumbar region: Secondary | ICD-10-CM | POA: Diagnosis not present

## 2020-01-18 DIAGNOSIS — H353231 Exudative age-related macular degeneration, bilateral, with active choroidal neovascularization: Secondary | ICD-10-CM | POA: Diagnosis not present

## 2020-02-22 DIAGNOSIS — H353231 Exudative age-related macular degeneration, bilateral, with active choroidal neovascularization: Secondary | ICD-10-CM | POA: Diagnosis not present

## 2020-02-22 DIAGNOSIS — H2511 Age-related nuclear cataract, right eye: Secondary | ICD-10-CM | POA: Diagnosis not present

## 2020-02-22 DIAGNOSIS — Z9842 Cataract extraction status, left eye: Secondary | ICD-10-CM | POA: Diagnosis not present

## 2020-02-22 DIAGNOSIS — Z961 Presence of intraocular lens: Secondary | ICD-10-CM | POA: Diagnosis not present

## 2020-02-22 DIAGNOSIS — H401122 Primary open-angle glaucoma, left eye, moderate stage: Secondary | ICD-10-CM | POA: Diagnosis not present

## 2020-03-11 ENCOUNTER — Other Ambulatory Visit: Payer: Self-pay

## 2020-03-11 ENCOUNTER — Ambulatory Visit (INDEPENDENT_AMBULATORY_CARE_PROVIDER_SITE_OTHER): Payer: Medicare Other | Admitting: Family Medicine

## 2020-03-11 DIAGNOSIS — R739 Hyperglycemia, unspecified: Secondary | ICD-10-CM

## 2020-03-11 DIAGNOSIS — E782 Mixed hyperlipidemia: Secondary | ICD-10-CM | POA: Diagnosis not present

## 2020-03-11 DIAGNOSIS — J449 Chronic obstructive pulmonary disease, unspecified: Secondary | ICD-10-CM

## 2020-03-11 DIAGNOSIS — I480 Paroxysmal atrial fibrillation: Secondary | ICD-10-CM | POA: Diagnosis not present

## 2020-03-11 DIAGNOSIS — I10 Essential (primary) hypertension: Secondary | ICD-10-CM | POA: Diagnosis not present

## 2020-03-11 DIAGNOSIS — E559 Vitamin D deficiency, unspecified: Secondary | ICD-10-CM | POA: Diagnosis not present

## 2020-03-11 NOTE — Progress Notes (Signed)
Subjective:    Patient ID: Ryan [name], male    DOB: 03-01-1946, 74 y.o.   MRN: [id number]  Chief Complaint  Patient presents with  . 6 month follow up    HPI Patient is in today for follow up on chronic medical concerns. No recent febrile illness or hospitalizations. He notes his 74 year old R TKR has started to pop a little but no pain or instability. His right hip is causing him some discomfort especially on arising. Does not stop him doing what he wants to do. He has been following with cardiology and he cannot tolerate the statins and Zetia and he was asked to consider the lipid clinic but has not proceeded so far. Denies CP/palp/SOB/HA/congestion/fevers/GI or GU c/o. Taking meds as prescribed.  Past Medical History:  Diagnosis Date  . A-fib ([place]) 03/22/2018  . Anemia   . Atrial flutter ([place]) 03/31/2012   converted spontaneously to sinus  . CAD (coronary artery disease)    reportedly moderate CAD; managed medically  . COPD (chronic obstructive pulmonary disease) ([place])   . Eczema 08/03/2014  . Edema 03/22/2018  . GERD (gastroesophageal reflux disease)   . H/O: rheumatic fever   . Heart murmur   . Heart murmur   . High risk medication use    on amiodarone since 03/31/2012  . History of colonoscopy   . Hyperglycemia 06/21/2017  . Hyperlipidemia   . Hypertension   . Kidney stone 06/21/2017  . Kidney stones   . Lobar pneumonia ([place]) 03/22/2018  . Low vitamin D level 06/21/2017  . Macular degeneration   . Osteoporosis 05/31/2016    Past Surgical History:  Procedure Laterality Date  . APPENDECTOMY    . CARDIAC CATHETERIZATION  04/22/2011   moderate left main and RCA stenosis not significant by FFR and IVUS on medical therapy  . CARDIOVERSION N/A 04/25/2018   Procedure: CARDIOVERSION;  Surgeon: Skeet Latch, MD;  Location: [facility] ENDOSCOPY;  Service: Cardiovascular;  Laterality: N/A;  . CERVICAL DISCECTOMY     C5,6,7 disc fused  with plate and 5 1" screws  . CHOLECYSTECTOMY     . colon polyp removal  3/1.19  . FOOT SURGERY     right calcification removed from top of foot  . knee cartiledge Right    right knee  . MOUTH SURGERY     periodontal surgery, bridges, splint in front,     Family History  Problem Relation Age of Onset  . Heart disease Mother   . Diabetes Mother   . Cirrhosis Mother   . Emphysema Mother        never smoked but 2nd hand through her spouse  . Hypertension Mother   . Macular degeneration Mother   . Heart disease Father   . Cancer Father        prostate  . Hyperlipidemia Father   . Hypertension Father   . Varicose Veins Father   . Heart attack Father   . Peripheral vascular disease Father   . Heart disease Sister   . Arthritis Sister   . Hyperlipidemia Sister   . Obesity Sister   . Macular degeneration Sister   . Heart disease Brother        5 stents  . Hyperlipidemia Brother   . Macular degeneration Maternal Grandfather   . Cirrhosis Sister   . Obesity Sister   . Arthritis Sister   . Heart disease Sister   . Obesity Sister   . Liver disease Other   .  Prostate cancer Other   . Coronary artery disease Other     Social History   Socioeconomic History  . Marital status: Married    Spouse name: Not on file  . Number of children: 0  . Years of education: Not on file  . Highest education level: Not on file  Occupational History  . Occupation: retired    Fish farm manager: RETIRED  Tobacco Use  . Smoking status: Former Smoker    Packs/day: 0.50    Years: 50.00    Pack years: 25.00    Types: Cigarettes    Quit date: 05/01/2013    Years since quitting: 6.8  . Smokeless tobacco: Former Systems developer  . Tobacco comment: using e-Cig//ldc  Substance and Sexual Activity  . Alcohol use: No  . Drug use: No  . Sexual activity: Yes    Comment: lives with wife, no dietary restrictions  Other Topics Concern  . Not on file  Social History Narrative  . Not on file   Social Determinants of Health   Financial Resource Strain:   .  Difficulty of Paying Living Expenses:   Food Insecurity:   . Worried About Charity fundraiser in the Last Year:   . Arboriculturist in the Last Year:   Transportation Needs:   . Film/video editor (Medical):   Marland Kitchen Lack of Transportation (Non-Medical):   Physical Activity:   . Days of Exercise per Week:   . Minutes of Exercise per Session:   Stress:   . Feeling of Stress :   Social Connections:   . Frequency of Communication with Friends and Family:   . Frequency of Social Gatherings with Friends and Family:   . Attends Religious Services:   . Active Member of Clubs or Organizations:   . Attends Archivist Meetings:   Marland Kitchen Marital Status:   Intimate Partner Violence:   . Fear of Current or Ex-Partner:   . Emotionally Abused:   Marland Kitchen Physically Abused:   . Sexually Abused:     Outpatient Medications Prior to Visit  Medication Sig Dispense Refill  . albuterol (VENTOLIN HFA) 108 (90 Base) MCG/ACT inhaler Inhale 1-2 puffs into the lungs every 6 (six) hours as needed for wheezing or shortness of breath. 18 g 2  . amoxicillin (AMOXIL) 500 MG capsule Take 2,000 mg by mouth See admin instructions. Take 2000 mg 1 hour prior to dental work    . brimonidine (ALPHAGAN) 0.2 % ophthalmic solution Instill 1 drop into the left eye twice daily  7  . Cholecalciferol (VITAMIN D3) 5000 units CAPS Take 5,000 Units by mouth every Monday, Wednesday, and Friday.    . clobetasol (TEMOVATE) 0.05 % external solution APPLY 1 APPLICATION TOPICALLY TWICE DAILY (Patient taking differently: Apply 1 application topically 2 (two) times daily as needed (psoriasis). ) 100 mL 3  . ELIQUIS 5 MG TABS tablet TAKE 1 TABLET BY MOUTH TWO  TIMES DAILY 180 tablet 3  . furosemide (LASIX) 20 MG tablet TAKE 1 TABLET BY MOUTH  DAILY 90 tablet 3  . latanoprost (XALATAN) 0.005 % ophthalmic solution Place 1 drop into the left eye at bedtime.     . metoprolol tartrate (LOPRESSOR) 25 MG tablet TAKE 1 TABLET BY MOUTH  TWICE DAILY  180 tablet 3  . Multiple Vitamins-Minerals (ICAPS MV) TABS Take 1 tablet by mouth 2 (two) times daily.     Marland Kitchen omeprazole (PRILOSEC OTC) 20 MG tablet Take 20 mg by mouth daily.    Marland Kitchen  potassium chloride (KLOR-CON) 10 MEQ tablet TAKE 1 TABLET BY MOUTH  DAILY 90 tablet 3  . propafenone (RYTHMOL) 225 MG tablet TAKE 1 TABLET BY MOUTH TWO  TIMES DAILY 180 tablet 2  . Ranibizumab (LUCENTIS IO) Inject 1 Dose into the eye every 3 (three) months. (Macular Degeneration)    . tamsulosin (FLOMAX) 0.4 MG CAPS capsule Take 1 capsule (0.4 mg total) by mouth daily. (Patient taking differently: Take 0.4 mg by mouth at bedtime. ) 30 capsule 0  . fluconazole (DIFLUCAN) 100 MG tablet 1 tab po daily x 3 days can repeat in 1 week 6 tablet 2   No facility-administered medications prior to visit.    Allergies  Allergen Reactions  . Mucinex D [Pseudoephedrine-Guaifenesin Er] Anaphylaxis    Stomach cramps  . Polymyxin B-Trimethoprim Swelling    Eye drops made eyes swell  . Codeine Hives, Itching and Rash  . Statins Other (See Comments)    Muscle cramps   . Pseudoephedrine-Guaifenesin Nausea And Vomiting    Stomach cramps  . Tapentadol Other (See Comments)    unknown   . Ciprofloxacin Hives, Itching, Nausea Only and Rash  . Moxifloxacin Nausea Only and Other (See Comments)    Stomach cramps  . Rofecoxib     Stomach cramping     Review of Systems  Constitutional: Negative for fever and malaise/fatigue.  HENT: Negative for congestion.   Eyes: Negative for blurred vision.  Respiratory: Negative for shortness of breath.   Cardiovascular: Negative for chest pain, palpitations and leg swelling.  Gastrointestinal: Negative for abdominal pain, blood in stool and nausea.  Genitourinary: Negative for dysuria and frequency.  Musculoskeletal: Positive for joint pain. Negative for falls.  Skin: Negative for rash.  Neurological: Negative for dizziness, loss of consciousness and headaches.  Endo/Heme/Allergies:  Negative for environmental allergies.  Psychiatric/Behavioral: Negative for depression. The patient is not nervous/anxious.        Objective:    Physical Exam Vitals and nursing note reviewed.  Constitutional:      General: He is not in acute distress.    Appearance: He is well-developed.  HENT:     Head: Normocephalic and atraumatic.     Nose: Nose normal.  Eyes:     General:        Right eye: No discharge.        Left eye: No discharge.  Cardiovascular:     Rate and Rhythm: Normal rate and regular rhythm.     Heart sounds: No murmur.  Pulmonary:     Effort: Pulmonary effort is normal.     Breath sounds: Normal breath sounds.  Abdominal:     General: Bowel sounds are normal.     Palpations: Abdomen is soft.     Tenderness: There is no abdominal tenderness.  Musculoskeletal:     Cervical back: Normal range of motion and neck supple.  Skin:    General: Skin is warm and dry.  Neurological:     Mental Status: He is alert and oriented to person, place, and time.     BP 118/60 (BP Location: Left Arm, Cuff Size: Normal)   Pulse 61   Temp 98 F (36.7 C) (Temporal)   Resp 12   Ht 5\' 4"  (1.626 m)   Wt 181 lb (82.1 kg)   SpO2 97%   BMI 31.07 kg/m  Wt Readings from Last 3 Encounters:  03/11/20 181 lb (82.1 kg)  09/14/19 184 lb 12.8 oz (83.8 kg)  06/30/19 176 lb  3.2 oz (79.9 kg)    Diabetic Foot Exam - Simple   No data filed     Lab Results  Component Value Date   WBC 8.3 12/19/2019   HGB 13.0 12/19/2019   HCT 37.4 (L) 12/19/2019   PLT 188.0 12/19/2019   GLUCOSE 72 12/19/2019   CHOL 231 (H) 12/19/2019   TRIG 126.0 12/19/2019   HDL 37.80 (L) 12/19/2019   LDLDIRECT 134.2 12/01/2013   LDLCALC 168 (H) 12/19/2019   ALT 13 12/19/2019   AST 18 12/19/2019   NA 141 12/19/2019   K 4.6 12/19/2019   CL 105 12/19/2019   CREATININE 1.20 12/19/2019   BUN 17 12/19/2019   CO2 28 12/19/2019   TSH 2.37 09/14/2019   PSA 1.64 01/14/2015   INR 1.17 04/01/2012   HGBA1C  4.9 09/14/2019    Lab Results  Component Value Date   TSH 2.37 09/14/2019   Lab Results  Component Value Date   WBC 8.3 12/19/2019   HGB 13.0 12/19/2019   HCT 37.4 (L) 12/19/2019   MCV 92.1 12/19/2019   PLT 188.0 12/19/2019   Lab Results  Component Value Date   NA 141 12/19/2019   K 4.6 12/19/2019   CO2 28 12/19/2019   GLUCOSE 72 12/19/2019   BUN 17 12/19/2019   CREATININE 1.20 12/19/2019   BILITOT 0.7 12/19/2019   ALKPHOS 53 12/19/2019   AST 18 12/19/2019   ALT 13 12/19/2019   PROT 6.5 12/19/2019   ALBUMIN 3.8 12/19/2019   CALCIUM 8.7 12/19/2019   ANIONGAP 14 03/25/2018   GFR 59.36 (L) 12/19/2019   Lab Results  Component Value Date   CHOL 231 (H) 12/19/2019   Lab Results  Component Value Date   HDL 37.80 (L) 12/19/2019   Lab Results  Component Value Date   LDLCALC 168 (H) 12/19/2019   Lab Results  Component Value Date   TRIG 126.0 12/19/2019   Lab Results  Component Value Date   CHOLHDL 6 12/19/2019   Lab Results  Component Value Date   HGBA1C 4.9 09/14/2019       Assessment & Plan:   Problem List Items Addressed This Visit    Hyperlipidemia, mixed    Encouraged heart healthy diet, increase exercise, avoid trans fats, consider a krill oil cap daily      Relevant Orders   Lipid panel   Essential hypertension    Well controlled, no changes to meds. Encouraged heart healthy diet such as the DASH diet and exercise as tolerated.       Relevant Orders   CBC   Comprehensive metabolic panel   TSH   COPD GOLD I    Is using albuterol in the morning 1 or 2 puffs depending on activity level. And he uses it prior to exercise. That seems to be working      Hyperglycemia    hgba1c acceptable, minimize simple carbs. Increase exercise as tolerated.       Relevant Orders   Hemoglobin A1c   A-fib (HCC)    Well controlled on Rythmol and follows with [name] of cardiology.      Vitamin D deficiency    Supplement and monitor      Relevant  Orders   VITAMIN D 25 Hydroxy (Vit-D Deficiency, Fractures)      I have discontinued Jaquaveon L. Knoles "L"'s fluconazole. I am also having him maintain his ICaps MV, omeprazole, Ranibizumab (LUCENTIS IO), latanoprost, clobetasol, tamsulosin, brimonidine, amoxicillin, Vitamin D3, Eliquis, metoprolol tartrate,  furosemide, potassium chloride, albuterol, and propafenone.  No orders of the defined types were placed in this encounter.    Penni Homans, MD

## 2020-03-11 NOTE — Patient Instructions (Signed)

## 2020-03-11 NOTE — Assessment & Plan Note (Signed)
Is using albuterol in the morning 1 or 2 puffs depending on activity level. And he uses it prior to exercise. That seems to be working

## 2020-03-11 NOTE — Assessment & Plan Note (Signed)
Encouraged heart healthy diet, increase exercise, avoid trans fats, consider a krill oil cap daily 

## 2020-03-11 NOTE — Assessment & Plan Note (Signed)
Supplement and monitor 

## 2020-03-11 NOTE — Assessment & Plan Note (Signed)
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

## 2020-03-11 NOTE — Assessment & Plan Note (Signed)
Well controlled, no changes to meds. Encouraged heart healthy diet such as the DASH diet and exercise as tolerated.  °

## 2020-03-11 NOTE — Assessment & Plan Note (Signed)
hgba1c acceptable, minimize simple carbs. Increase exercise as tolerated.  

## 2020-03-11 NOTE — Assessment & Plan Note (Signed)
Well controlled on Rythmol and follows with [name] of cardiology.

## 2020-04-08 ENCOUNTER — Encounter (HOSPITAL_COMMUNITY): Payer: Self-pay | Admitting: Emergency Medicine

## 2020-04-08 ENCOUNTER — Other Ambulatory Visit: Payer: Self-pay

## 2020-04-08 ENCOUNTER — Emergency Department (HOSPITAL_COMMUNITY)
Admission: EM | Admit: 2020-04-08 | Discharge: 2020-04-08 | Disposition: A | Discharge status: Home / Self Care | Payer: Medicare Other | Source: Home / Self Care | Attending: Emergency Medicine | Admitting: Emergency Medicine

## 2020-04-08 DIAGNOSIS — Z79899 Other long term (current) drug therapy: Secondary | ICD-10-CM | POA: Insufficient documentation

## 2020-04-08 DIAGNOSIS — R338 Other retention of urine: Secondary | ICD-10-CM | POA: Diagnosis not present

## 2020-04-08 DIAGNOSIS — I119 Hypertensive heart disease without heart failure: Secondary | ICD-10-CM | POA: Insufficient documentation

## 2020-04-08 DIAGNOSIS — R339 Retention of urine, unspecified: Secondary | ICD-10-CM

## 2020-04-08 DIAGNOSIS — J449 Chronic obstructive pulmonary disease, unspecified: Secondary | ICD-10-CM | POA: Insufficient documentation

## 2020-04-08 DIAGNOSIS — Z87891 Personal history of nicotine dependence: Secondary | ICD-10-CM | POA: Insufficient documentation

## 2020-04-08 DIAGNOSIS — Z9861 Coronary angioplasty status: Secondary | ICD-10-CM | POA: Insufficient documentation

## 2020-04-08 DIAGNOSIS — I10 Essential (primary) hypertension: Secondary | ICD-10-CM | POA: Diagnosis not present

## 2020-04-08 DIAGNOSIS — R778 Other specified abnormalities of plasma proteins: Secondary | ICD-10-CM | POA: Diagnosis not present

## 2020-04-08 DIAGNOSIS — Z20822 Contact with and (suspected) exposure to covid-19: Secondary | ICD-10-CM | POA: Diagnosis not present

## 2020-04-08 DIAGNOSIS — R1110 Vomiting, unspecified: Secondary | ICD-10-CM | POA: Diagnosis not present

## 2020-04-08 LAB — URINALYSIS, ROUTINE W REFLEX MICROSCOPIC
Bilirubin Urine: NEGATIVE
Glucose, UA: NEGATIVE mg/dL
Hgb urine dipstick: NEGATIVE
Ketones, ur: NEGATIVE mg/dL
Leukocytes,Ua: NEGATIVE
Nitrite: NEGATIVE
Protein, ur: NEGATIVE mg/dL
Specific Gravity, Urine: 1.013 (ref 1.005–1.030)
pH: 6 (ref 5.0–8.0)

## 2020-04-08 MED ORDER — TRAMADOL HCL 50 MG PO TABS
50.0000 mg | ORAL_TABLET | Freq: Once | ORAL | Status: AC
  Administered 2020-04-08: 50 mg via ORAL
  Filled 2020-04-08: qty 1

## 2020-04-08 MED ORDER — TRAMADOL HCL 50 MG PO TABS: 50.0000 mg | ORAL_TABLET | Freq: Four times a day (QID) | ORAL | 0 refills | Status: DC | PRN

## 2020-04-08 NOTE — ED Provider Notes (Signed)
[facility] DEPT Provider Note   CSN: [id number] Arrival date & time: 04/08/20  0434     History Chief Complaint  Patient presents with  . Urinary Retention    Ryan [name] is a 74 y.o. male.  The history is provided by the patient and medical records.    74 year old male with history of A. fib on Eliquis, coronary artery disease, COPD, hyperlipidemia, hypertension, history of BPH, presenting to the ED with urinary retention.  States he has had some difficulty urinating over the past 48 hours, however has been unable to urinate since last evening.  He was able to squeeze out a little bit of urine around 9 PM, no meaningful urinary stream.  States he feels the urge to urinate but unable to pass the urine.  He has had some nausea but denies vomiting.  No fever or chills.  Does report history of urinary retention many years ago.  No prior urologic surgery.  Past Medical History:  Diagnosis Date  . A-fib ([place]) 03/22/2018  . Anemia   . Atrial flutter ([place]) 03/31/2012   converted spontaneously to sinus  . CAD (coronary artery disease)    reportedly moderate CAD; managed medically  . COPD (chronic obstructive pulmonary disease) ([place])   . Eczema 08/03/2014  . Edema 03/22/2018  . GERD (gastroesophageal reflux disease)   . H/O: rheumatic fever   . Heart murmur   . Heart murmur   . High risk medication use    on amiodarone since 03/31/2012  . History of colonoscopy   . Hyperglycemia 06/21/2017  . Hyperlipidemia   . Hypertension   . Kidney stone 06/21/2017  . Kidney stones   . Lobar pneumonia ([place]) 03/22/2018  . Low vitamin D level 06/21/2017  . Macular degeneration   . Osteoporosis 05/31/2016    Patient Active Problem List   Diagnosis Date Noted  . Statin intolerance 12/19/2019  . Vitamin D deficiency 09/18/2019  . CHF (congestive heart failure) ([place]) 03/22/2018  . A-fib ([place]) 03/22/2018  . Edema 03/22/2018  . H/O: rheumatic fever 12/27/2017  . Low  vitamin D level 06/21/2017  . Hyperglycemia 06/21/2017  . Kidney stone 06/21/2017  . 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 Osteoporosis 05/31/2016  . BPH (benign prostatic hyperplasia) 01/20/2015  . Erectile dysfunction 01/20/2015  . Rectal bleeding 01/20/2015  . Preventative health care 01/20/2015  . Eczema 08/03/2014  . Sleep apnea 03/01/2014  . Anticoagulation adequate with anticoagulant therapy 03/01/2014  . Pre-syncope 02/26/2014  . Abdominal pain 02/26/2014  . PAD (peripheral artery disease) ([place]) 02/26/2014  . Constipation 03/06/2013  . Easy bruising 03/06/2013  . History of alcohol abuse 04/15/2012  . CAD (coronary atherosclerotic disease) 05/08/2011  . 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 UNS ADVRS EFF UNS RX MEDICINAL&BIOLOGICAL SBSTNC 11/11/2009  . H/O tobacco use, presenting hazards to health 07/29/2009  . COPD GOLD I 07/29/2009  . Hyperlipidemia, mixed 06/20/2007  . Macular degeneration (senile) of retina 06/20/2007  . Essential hypertension 06/20/2007  . GERD 06/16/2007    Past Surgical History:  Procedure Laterality Date  . APPENDECTOMY    . CARDIAC CATHETERIZATION  04/22/2011   moderate left main and RCA stenosis not significant by FFR and IVUS on medical therapy  . CARDIOVERSION N/A 04/25/2018   Procedure: CARDIOVERSION;  Surgeon: Skeet Latch, MD;  Location: [facility];  Service: Cardiovascular;  Laterality: N/A;  . CERVICAL DISCECTOMY     C5,6,7 disc fused  with plate and 5 1" screws  . CHOLECYSTECTOMY    . colon polyp removal  3/1.19  . FOOT SURGERY     right calcification removed from top of foot  . knee cartiledge Right    right knee  . MOUTH SURGERY     periodontal surgery, bridges, splint in front,        Family History  Problem Relation Age of Onset  . Heart disease Mother   . Diabetes Mother   . Cirrhosis Mother   . Emphysema Mother        never smoked but 2nd hand through her  spouse  . Hypertension Mother   . Macular degeneration Mother   . Heart disease Father   . Cancer Father        prostate  . Hyperlipidemia Father   . Hypertension Father   . Varicose Veins Father   . Heart attack Father   . Peripheral vascular disease Father   . Heart disease Sister   . Arthritis Sister   . Hyperlipidemia Sister   . Obesity Sister   . Macular degeneration Sister   . Heart disease Brother        5 stents  . Hyperlipidemia Brother   . Macular degeneration Maternal Grandfather   . Cirrhosis Sister   . Obesity Sister   . Arthritis Sister   . Heart disease Sister   . Obesity Sister   . Liver disease Other   . Prostate cancer Other   . Coronary artery disease Other     Social History   Tobacco Use  . Smoking status: Former Smoker    Packs/day: 0.50    Years: 50.00    Pack years: 25.00    Types: Cigarettes    Quit date: 05/01/2013    Years since quitting: 6.9  . Smokeless tobacco: Former Systems developer  . Tobacco comment: using e-Cig//ldc  Vaping Use  . Vaping Use: Never used  Substance Use Topics  . Alcohol use: No  . Drug use: No    Home Medications Prior to Admission medications   Medication Sig Start Date End Date Taking? Authorizing Provider  albuterol (VENTOLIN HFA) 108 (90 Base) MCG/ACT inhaler Inhale 1-2 puffs into the lungs every 6 (six) hours as needed for wheezing or shortness of breath. 09/14/19   [name], MD  amoxicillin (AMOXIL) 500 MG capsule Take 2,000 mg by mouth See admin instructions. Take 2000 mg 1 hour prior to dental work    [provider]  brimonidine (ALPHAGAN) 0.2 % ophthalmic solution Instill 1 drop into the left eye twice daily 09/10/17   [provider]  Cholecalciferol (VITAMIN D3) 5000 units CAPS Take 5,000 Units by mouth every Monday, Wednesday, and Friday.    [provider]  clobetasol (TEMOVATE) 0.05 % external solution APPLY 1 APPLICATION TOPICALLY TWICE DAILY Patient taking differently:  Apply 1 application topically 2 (two) times daily as needed (psoriasis).  11/20/16   [name], MD  ELIQUIS 5 MG TABS tablet TAKE 1 TABLET BY MOUTH TWO  TIMES DAILY 06/05/19   Evans Lance, MD  furosemide (LASIX) 20 MG tablet TAKE 1 TABLET BY MOUTH  DAILY 08/14/19   [name], MD  latanoprost (XALATAN) 0.005 % ophthalmic solution Place 1 drop into the left eye at bedtime.     [provider]  metoprolol tartrate (LOPRESSOR) 25 MG tablet TAKE 1 TABLET BY MOUTH  TWICE DAILY 07/18/19   Evans Lance, MD  Multiple Vitamins-Minerals (ICAPS MV) TABS Take 1 tablet by mouth 2 (two) times daily.     [provider]  omeprazole (PRILOSEC OTC) 20 MG tablet Take 20 mg by mouth daily.    [provider]  potassium chloride (KLOR-CON) 10 MEQ tablet TAKE 1 TABLET BY MOUTH  DAILY 08/14/19   Evans Lance, MD  propafenone (RYTHMOL) 225 MG tablet TAKE 1 TABLET BY MOUTH TWO  TIMES DAILY 09/25/19   Evans Lance, MD  Ranibizumab (LUCENTIS IO) Inject 1 Dose into the eye every 3 (three) months. (Macular Degeneration)    [provider]  tamsulosin (FLOMAX) 0.4 MG CAPS capsule Take 1 capsule (0.4 mg total) by mouth daily. Patient taking differently: Take 0.4 mg by mouth at bedtime.  07/25/17   Hedges, Dellis Filbert, PA-C    Allergies    Mucinex d [pseudoephedrine-guaifenesin er], Other, Polymyxin b-trimethoprim, Pseudoephedrine, Codeine, Guaiacol, Polymyxin b, Statins, Atorvastatin, Meloxicam, Pseudoephedrine-guaifenesin, Rosuvastatin calcium, Simvastatin, Tapentadol, Ciprofloxacin, Moxifloxacin, and Rofecoxib  Review of Systems   Review of Systems  Genitourinary: Positive for difficulty urinating.  All other systems reviewed and are negative.   Physical Exam Updated Vital Signs BP 135/76 (BP Location: Right Arm)   Pulse 67   Temp 98.1 F (36.7 C) (Oral)   Resp (!) 25   SpO2 95%   Physical Exam Vitals and nursing note reviewed.  Constitutional:       Appearance: He is well-developed.     Comments: Appears uncomfortable  HENT:     Head: Normocephalic and atraumatic.  Eyes:     Conjunctiva/sclera: Conjunctivae normal.     Pupils: Pupils are equal, round, and reactive to light.  Cardiovascular:     Rate and Rhythm: Normal rate and regular rhythm.     Heart sounds: Normal heart sounds.  Pulmonary:     Effort: Pulmonary effort is normal. No respiratory distress.     Breath sounds: Normal breath sounds. No rhonchi.  Abdominal:     General: Bowel sounds are normal.     Palpations: Abdomen is soft.     Tenderness: There is no abdominal tenderness. There is no rebound.     Comments: Endorses fullness over the bladder, no distention noted  Musculoskeletal:        General: Normal range of motion.     Cervical back: Normal range of motion.  Skin:    General: Skin is warm and dry.  Neurological:     Mental Status: He is alert and oriented to person, place, and time.     ED Results / Procedures / Treatments   Labs (all labs ordered are listed, but only abnormal results are displayed) Labs Reviewed  URINE CULTURE  URINALYSIS, ROUTINE W REFLEX MICROSCOPIC    EKG None  Radiology No results found.  Procedures Procedures (including critical care time)  Medications Ordered in ED Medications - No data to display  ED Course  I have reviewed the triage vital signs and the nursing notes.  Pertinent labs & imaging results that were available during my care of the patient were reviewed by me and considered in my medical decision making (see chart for details).    MDM Rules/Calculators/A&P  74 year old male presenting to the ED with urinary retention.  States his last urination was last evening around 9 PM.  He feels the urge to urinate but unable to pass any urine.  No fever or vomiting.  Does report history of BPH.  Reports one episode of urinary retention many years  ago that required a catheter.  Foley inserted and freely  draining.  UA pending.  6:11 AM UA clear.  Patient has drained out approx 200c of urine from bladder.  States pressure sensation/need to urinate has resolved but a lot of discomfort in penis from catheter itself.  Patient is requesting catheter to be removed.  I have had a long conversation with patient and his wife that this can be done, however I do have concerns that he will go home and be unable to urinate again, thus likely requiring him to return to the ED and have catheter replaced.  He does not want to go through catheter placement again.  I have offered to give him pain medication at home to try to help him tolerate until he can follow-up with urology today or tomorrow-- he is agreeable.  He requested that I send a message to his urologist to try and help expedite follow-up which I have done.  Wife will contact the office later today for appt.  Return here for new concerns.  Final Clinical Impression(s) / ED Diagnoses Final diagnoses:  Urinary retention    Rx / DC Orders ED Discharge Orders         Ordered    traMADol (ULTRAM) 50 MG tablet  Every 6 hours PRN     Discontinue  Reprint     04/08/20 0617           Larene Pickett, PA-C 04/08/20 0626    Ward, Delice Bison, DO 04/08/20 0630

## 2020-04-08 NOTE — ED Triage Notes (Signed)
Patient complaining of urinary retention. Patient states that he has a enlarge prostate. He also states that he last urinated at 2100 on 04/07/2020.

## 2020-04-08 NOTE — Discharge Instructions (Signed)
I have sent a message to Dr. Diona Fanti-- stay in contact with his office to get some follow-up this week. Take tramadol for pain.  Can take tylenol with this if needed. Return here for new concerns.

## 2020-04-09 ENCOUNTER — Emergency Department (HOSPITAL_COMMUNITY): Payer: Medicare Other

## 2020-04-09 ENCOUNTER — Other Ambulatory Visit (HOSPITAL_COMMUNITY): Payer: Medicare Other

## 2020-04-09 ENCOUNTER — Encounter (HOSPITAL_COMMUNITY): Payer: Self-pay

## 2020-04-09 ENCOUNTER — Other Ambulatory Visit: Payer: Self-pay

## 2020-04-09 ENCOUNTER — Inpatient Hospital Stay (HOSPITAL_COMMUNITY)
Admission: EM | Admit: 2020-04-09 | Discharge: 2020-04-14 | DRG: 695 | Disposition: A | Discharge status: Home / Self Care | Payer: Medicare Other | Attending: Internal Medicine | Admitting: Internal Medicine

## 2020-04-09 DIAGNOSIS — Z8261 Family history of arthritis: Secondary | ICD-10-CM

## 2020-04-09 DIAGNOSIS — K219 Gastro-esophageal reflux disease without esophagitis: Secondary | ICD-10-CM | POA: Diagnosis present

## 2020-04-09 DIAGNOSIS — R197 Diarrhea, unspecified: Secondary | ICD-10-CM | POA: Diagnosis not present

## 2020-04-09 DIAGNOSIS — J690 Pneumonitis due to inhalation of food and vomit: Secondary | ICD-10-CM | POA: Diagnosis present

## 2020-04-09 DIAGNOSIS — Z20822 Contact with and (suspected) exposure to covid-19: Secondary | ICD-10-CM | POA: Diagnosis present

## 2020-04-09 DIAGNOSIS — Z8249 Family history of ischemic heart disease and other diseases of the circulatory system: Secondary | ICD-10-CM

## 2020-04-09 DIAGNOSIS — R338 Other retention of urine: Principal | ICD-10-CM | POA: Diagnosis present

## 2020-04-09 DIAGNOSIS — Z7901 Long term (current) use of anticoagulants: Secondary | ICD-10-CM

## 2020-04-09 DIAGNOSIS — Z881 Allergy status to other antibiotic agents status: Secondary | ICD-10-CM

## 2020-04-09 DIAGNOSIS — R0603 Acute respiratory distress: Secondary | ICD-10-CM

## 2020-04-09 DIAGNOSIS — Z79899 Other long term (current) drug therapy: Secondary | ICD-10-CM

## 2020-04-09 DIAGNOSIS — I2510 Atherosclerotic heart disease of native coronary artery without angina pectoris: Secondary | ICD-10-CM | POA: Diagnosis present

## 2020-04-09 DIAGNOSIS — R1110 Vomiting, unspecified: Secondary | ICD-10-CM | POA: Diagnosis not present

## 2020-04-09 DIAGNOSIS — Z981 Arthrodesis status: Secondary | ICD-10-CM

## 2020-04-09 DIAGNOSIS — R112 Nausea with vomiting, unspecified: Secondary | ICD-10-CM | POA: Diagnosis present

## 2020-04-09 DIAGNOSIS — I700 Atherosclerosis of aorta: Secondary | ICD-10-CM | POA: Diagnosis present

## 2020-04-09 DIAGNOSIS — K429 Umbilical hernia without obstruction or gangrene: Secondary | ICD-10-CM | POA: Diagnosis present

## 2020-04-09 DIAGNOSIS — K2970 Gastritis, unspecified, without bleeding: Secondary | ICD-10-CM | POA: Diagnosis present

## 2020-04-09 DIAGNOSIS — K529 Noninfective gastroenteritis and colitis, unspecified: Secondary | ICD-10-CM | POA: Diagnosis not present

## 2020-04-09 DIAGNOSIS — N401 Enlarged prostate with lower urinary tract symptoms: Secondary | ICD-10-CM | POA: Diagnosis present

## 2020-04-09 DIAGNOSIS — Z9049 Acquired absence of other specified parts of digestive tract: Secondary | ICD-10-CM

## 2020-04-09 DIAGNOSIS — N179 Acute kidney failure, unspecified: Secondary | ICD-10-CM | POA: Diagnosis present

## 2020-04-09 DIAGNOSIS — J811 Chronic pulmonary edema: Secondary | ICD-10-CM

## 2020-04-09 DIAGNOSIS — R0789 Other chest pain: Secondary | ICD-10-CM

## 2020-04-09 DIAGNOSIS — F1010 Alcohol abuse, uncomplicated: Secondary | ICD-10-CM | POA: Diagnosis present

## 2020-04-09 DIAGNOSIS — R0902 Hypoxemia: Secondary | ICD-10-CM | POA: Diagnosis not present

## 2020-04-09 DIAGNOSIS — Z833 Family history of diabetes mellitus: Secondary | ICD-10-CM

## 2020-04-09 DIAGNOSIS — I420 Dilated cardiomyopathy: Secondary | ICD-10-CM | POA: Diagnosis present

## 2020-04-09 DIAGNOSIS — F419 Anxiety disorder, unspecified: Secondary | ICD-10-CM | POA: Diagnosis present

## 2020-04-09 DIAGNOSIS — Z8042 Family history of malignant neoplasm of prostate: Secondary | ICD-10-CM

## 2020-04-09 DIAGNOSIS — R079 Chest pain, unspecified: Secondary | ICD-10-CM | POA: Diagnosis present

## 2020-04-09 DIAGNOSIS — H3530 Unspecified macular degeneration: Secondary | ICD-10-CM | POA: Diagnosis present

## 2020-04-09 DIAGNOSIS — E785 Hyperlipidemia, unspecified: Secondary | ICD-10-CM | POA: Diagnosis present

## 2020-04-09 DIAGNOSIS — E876 Hypokalemia: Secondary | ICD-10-CM | POA: Diagnosis present

## 2020-04-09 DIAGNOSIS — J984 Other disorders of lung: Secondary | ICD-10-CM | POA: Diagnosis present

## 2020-04-09 DIAGNOSIS — J439 Emphysema, unspecified: Secondary | ICD-10-CM | POA: Diagnosis present

## 2020-04-09 DIAGNOSIS — R778 Other specified abnormalities of plasma proteins: Secondary | ICD-10-CM | POA: Diagnosis not present

## 2020-04-09 DIAGNOSIS — I110 Hypertensive heart disease with heart failure: Secondary | ICD-10-CM | POA: Diagnosis present

## 2020-04-09 DIAGNOSIS — Z87442 Personal history of urinary calculi: Secondary | ICD-10-CM

## 2020-04-09 DIAGNOSIS — Z888 Allergy status to other drugs, medicaments and biological substances status: Secondary | ICD-10-CM

## 2020-04-09 DIAGNOSIS — K5730 Diverticulosis of large intestine without perforation or abscess without bleeding: Secondary | ICD-10-CM | POA: Diagnosis present

## 2020-04-09 DIAGNOSIS — I10 Essential (primary) hypertension: Secondary | ICD-10-CM | POA: Diagnosis present

## 2020-04-09 DIAGNOSIS — Z87891 Personal history of nicotine dependence: Secondary | ICD-10-CM

## 2020-04-09 DIAGNOSIS — I480 Paroxysmal atrial fibrillation: Secondary | ICD-10-CM | POA: Diagnosis present

## 2020-04-09 DIAGNOSIS — J849 Interstitial pulmonary disease, unspecified: Secondary | ICD-10-CM | POA: Diagnosis present

## 2020-04-09 LAB — COMPREHENSIVE METABOLIC PANEL
ALT: 16 U/L (ref 0–44)
AST: 23 U/L (ref 15–41)
Albumin: 3.2 g/dL — ABNORMAL LOW (ref 3.5–5.0)
Alkaline Phosphatase: 50 U/L (ref 38–126)
Anion gap: 14 (ref 5–15)
BUN: 10 mg/dL (ref 8–23)
CO2: 22 mmol/L (ref 22–32)
Calcium: 7.1 mg/dL — ABNORMAL LOW (ref 8.9–10.3)
Chloride: 100 mmol/L (ref 98–111)
Creatinine, Ser: 1.02 mg/dL (ref 0.61–1.24)
GFR calc Af Amer: >60 mL/min (ref >60)
GFR calc non Af Amer: >60 mL/min (ref >60)
Glucose, Bld: 96 mg/dL (ref 70–99)
Potassium: 2.9 mmol/L — ABNORMAL LOW (ref 3.5–5.1)
Sodium: 136 mmol/L (ref 135–145)
Total Bilirubin: 1.4 mg/dL — ABNORMAL HIGH (ref 0.3–1.2)
Total Protein: 6.3 g/dL — ABNORMAL LOW (ref 6.5–8.1)

## 2020-04-09 LAB — URINALYSIS, ROUTINE W REFLEX MICROSCOPIC
Bacteria, UA: NONE SEEN
Bilirubin Urine: NEGATIVE
Glucose, UA: NEGATIVE mg/dL
Ketones, ur: 5 mg/dL — AB
Nitrite: NEGATIVE
Protein, ur: NEGATIVE mg/dL
Specific Gravity, Urine: 1.004 — ABNORMAL LOW (ref 1.005–1.030)
pH: 7 (ref 5.0–8.0)

## 2020-04-09 LAB — URINE CULTURE: Culture: NO GROWTH

## 2020-04-09 LAB — CBC WITH DIFFERENTIAL/PLATELET
Abs Immature Granulocytes: 0.08 10*3/uL — ABNORMAL HIGH (ref 0.00–0.07)
Basophils Absolute: 0 10*3/uL (ref 0.0–0.1)
Basophils Relative: 0 %
Eosinophils Absolute: 0.1 10*3/uL (ref 0.0–0.5)
Eosinophils Relative: 1 %
HCT: 38.1 % — ABNORMAL LOW (ref 39.0–52.0)
Hemoglobin: 12.3 g/dL — ABNORMAL LOW (ref 13.0–17.0)
Immature Granulocytes: 1 %
Lymphocytes Relative: 16 %
Lymphs Abs: 1.5 10*3/uL (ref 0.7–4.0)
MCH: 30.2 pg (ref 26.0–34.0)
MCHC: 32.3 g/dL (ref 30.0–36.0)
MCV: 93.6 fL (ref 80.0–100.0)
Monocytes Absolute: 0.9 10*3/uL (ref 0.1–1.0)
Monocytes Relative: 9 %
Neutro Abs: 6.8 10*3/uL (ref 1.7–7.7)
Neutrophils Relative %: 73 %
Platelets: 237 10*3/uL (ref 150–400)
RBC: 4.07 MIL/uL — ABNORMAL LOW (ref 4.22–5.81)
RDW: 12.6 % (ref 11.5–15.5)
WBC: 9.4 10*3/uL (ref 4.0–10.5)
nRBC: 0 % (ref 0.0–0.2)

## 2020-04-09 LAB — LIPID PANEL
Cholesterol: 208 mg/dL — ABNORMAL HIGH (ref 0–200)
HDL: 25 mg/dL — ABNORMAL LOW (ref >40)
LDL Cholesterol: 167 mg/dL — ABNORMAL HIGH (ref 0–99)
Total CHOL/HDL Ratio: 8.3 RATIO
Triglycerides: 80 mg/dL (ref <150)
VLDL: 16 mg/dL (ref 0–40)

## 2020-04-09 LAB — PROTIME-INR
INR: 1.3 — ABNORMAL HIGH (ref 0.8–1.2)
Prothrombin Time: 16 seconds — ABNORMAL HIGH (ref 11.4–15.2)

## 2020-04-09 LAB — LACTIC ACID, PLASMA: Lactic Acid, Venous: 1.4 mmol/L (ref 0.5–1.9)

## 2020-04-09 LAB — SARS CORONAVIRUS 2 BY RT PCR (HOSPITAL ORDER, PERFORMED IN ~~LOC~~ HOSPITAL LAB): SARS Coronavirus 2: NEGATIVE

## 2020-04-09 LAB — POC OCCULT BLOOD, ED: Fecal Occult Bld: NEGATIVE

## 2020-04-09 LAB — CBG MONITORING, ED: Glucose-Capillary: 100 mg/dL — ABNORMAL HIGH (ref 70–99)

## 2020-04-09 LAB — MRSA PCR SCREENING: MRSA by PCR: NEGATIVE

## 2020-04-09 LAB — MAGNESIUM: Magnesium: 0.4 mg/dL — CL (ref 1.7–2.4)

## 2020-04-09 LAB — TROPONIN I (HIGH SENSITIVITY)
Troponin I (High Sensitivity): 241 ng/L (ref <18)
Troponin I (High Sensitivity): 187 ng/L (ref <18)

## 2020-04-09 LAB — APTT: aPTT: 38 seconds — ABNORMAL HIGH (ref 24–36)

## 2020-04-09 LAB — LIPASE, BLOOD: Lipase: 29 U/L (ref 11–51)

## 2020-04-09 MED ORDER — CLOBETASOL PROPIONATE 0.05 % EX CREA
1.0000 "application " | TOPICAL_CREAM | Freq: Two times a day (BID) | CUTANEOUS | Status: DC | PRN
  Filled 2020-04-09: qty 15

## 2020-04-09 MED ORDER — METOPROLOL TARTRATE 25 MG PO TABS
25.0000 mg | ORAL_TABLET | Freq: Two times a day (BID) | ORAL | Status: DC
  Administered 2020-04-09 – 2020-04-14 (×10): 25 mg via ORAL
  Filled 2020-04-09 (×10): qty 1

## 2020-04-09 MED ORDER — SODIUM CHLORIDE 0.9 % IV SOLN
1.0000 g | INTRAVENOUS | Status: DC
  Administered 2020-04-10 – 2020-04-11 (×2): 1 g via INTRAVENOUS
  Filled 2020-04-09 (×2): qty 10

## 2020-04-09 MED ORDER — SODIUM CHLORIDE 0.9 % IV SOLN: 2.0000 g | Freq: Once | INTRAVENOUS | Status: DC

## 2020-04-09 MED ORDER — SODIUM CHLORIDE 0.9 % IV SOLN: INTRAVENOUS | Status: DC

## 2020-04-09 MED ORDER — FUROSEMIDE 20 MG PO TABS
20.0000 mg | ORAL_TABLET | Freq: Every day | ORAL | Status: DC
  Administered 2020-04-09 – 2020-04-11 (×3): 20 mg via ORAL
  Filled 2020-04-09 (×3): qty 1

## 2020-04-09 MED ORDER — ONDANSETRON HCL 4 MG PO TABS: 4.0000 mg | ORAL_TABLET | Freq: Four times a day (QID) | ORAL | Status: DC | PRN

## 2020-04-09 MED ORDER — LATANOPROST 0.005 % OP SOLN
1.0000 [drp] | Freq: Every day | OPHTHALMIC | Status: DC
  Administered 2020-04-09 – 2020-04-13 (×5): 1 [drp] via OPHTHALMIC
  Filled 2020-04-09: qty 2.5

## 2020-04-09 MED ORDER — ONDANSETRON HCL 4 MG/2ML IJ SOLN: 4.0000 mg | Freq: Four times a day (QID) | INTRAMUSCULAR | Status: DC | PRN

## 2020-04-09 MED ORDER — APIXABAN 5 MG PO TABS
5.0000 mg | ORAL_TABLET | Freq: Two times a day (BID) | ORAL | Status: DC
  Administered 2020-04-09 – 2020-04-14 (×10): 5 mg via ORAL
  Filled 2020-04-09 (×10): qty 1

## 2020-04-09 MED ORDER — POTASSIUM CHLORIDE 10 MEQ/100ML IV SOLN
10.0000 meq | INTRAVENOUS | Status: AC
  Administered 2020-04-09 (×4): 10 meq via INTRAVENOUS
  Filled 2020-04-09 (×4): qty 100

## 2020-04-09 MED ORDER — IOHEXOL 300 MG/ML  SOLN
100.0000 mL | Freq: Once | INTRAMUSCULAR | Status: AC | PRN
  Administered 2020-04-09: 100 mL via INTRAVENOUS

## 2020-04-09 MED ORDER — CHLORHEXIDINE GLUCONATE CLOTH 2 % EX PADS
6.0000 | MEDICATED_PAD | Freq: Every day | CUTANEOUS | Status: DC
  Administered 2020-04-10 – 2020-04-14 (×3): 6 via TOPICAL

## 2020-04-09 MED ORDER — SODIUM CHLORIDE 0.9 % IV SOLN
500.0000 mg | Freq: Once | INTRAVENOUS | Status: AC
  Administered 2020-04-09: 500 mg via INTRAVENOUS
  Filled 2020-04-09: qty 500

## 2020-04-09 MED ORDER — SODIUM CHLORIDE 0.9 % IV SOLN
1.0000 g | Freq: Once | INTRAVENOUS | Status: AC
  Administered 2020-04-09: 1 g via INTRAVENOUS
  Filled 2020-04-09: qty 10

## 2020-04-09 MED ORDER — SODIUM CHLORIDE 0.9% FLUSH
3.0000 mL | Freq: Two times a day (BID) | INTRAVENOUS | Status: DC
  Administered 2020-04-09 – 2020-04-14 (×10): 3 mL via INTRAVENOUS

## 2020-04-09 MED ORDER — BRIMONIDINE TARTRATE 0.2 % OP SOLN
1.0000 [drp] | Freq: Two times a day (BID) | OPHTHALMIC | Status: DC
  Administered 2020-04-09 – 2020-04-14 (×10): 1 [drp] via OPHTHALMIC
  Filled 2020-04-09: qty 5

## 2020-04-09 MED ORDER — ASPIRIN 81 MG PO CHEW
324.0000 mg | CHEWABLE_TABLET | Freq: Once | ORAL | Status: AC
  Administered 2020-04-09: 324 mg via ORAL
  Filled 2020-04-09: qty 4

## 2020-04-09 MED ORDER — FENTANYL CITRATE (PF) 100 MCG/2ML IJ SOLN
50.0000 ug | Freq: Once | INTRAMUSCULAR | Status: AC
  Administered 2020-04-09: 50 ug via INTRAVENOUS
  Filled 2020-04-09: qty 2

## 2020-04-09 MED ORDER — PANTOPRAZOLE SODIUM 40 MG IV SOLR
40.0000 mg | Freq: Two times a day (BID) | INTRAVENOUS | Status: DC
  Administered 2020-04-09 – 2020-04-10 (×3): 40 mg via INTRAVENOUS
  Filled 2020-04-09 (×3): qty 40

## 2020-04-09 MED ORDER — NITROGLYCERIN 0.4 MG SL SUBL
0.4000 mg | SUBLINGUAL_TABLET | SUBLINGUAL | Status: DC | PRN
  Administered 2020-04-09 – 2020-04-11 (×4): 0.4 mg via SUBLINGUAL
  Filled 2020-04-09 (×3): qty 1

## 2020-04-09 MED ORDER — SODIUM CHLORIDE 0.9 % IV BOLUS
500.0000 mL | Freq: Once | INTRAVENOUS | Status: AC
  Administered 2020-04-09: 500 mL via INTRAVENOUS

## 2020-04-09 MED ORDER — TAMSULOSIN HCL 0.4 MG PO CAPS
0.4000 mg | ORAL_CAPSULE | Freq: Every day | ORAL | Status: DC
  Administered 2020-04-09 – 2020-04-14 (×6): 0.4 mg via ORAL
  Filled 2020-04-09 (×6): qty 1

## 2020-04-09 MED ORDER — POTASSIUM CHLORIDE CRYS ER 20 MEQ PO TBCR
40.0000 meq | EXTENDED_RELEASE_TABLET | ORAL | Status: AC
  Administered 2020-04-09: 40 meq via ORAL
  Filled 2020-04-09: qty 2

## 2020-04-09 MED ORDER — SODIUM CHLORIDE 0.9 % IV SOLN
500.0000 mg | INTRAVENOUS | Status: DC
  Administered 2020-04-10 – 2020-04-11 (×2): 500 mg via INTRAVENOUS
  Filled 2020-04-09 (×2): qty 500

## 2020-04-09 MED ORDER — FENTANYL CITRATE (PF) 100 MCG/2ML IJ SOLN
25.0000 ug | INTRAMUSCULAR | Status: DC | PRN
  Administered 2020-04-09 – 2020-04-11 (×6): 25 ug via INTRAVENOUS
  Filled 2020-04-09 (×7): qty 2

## 2020-04-09 MED ORDER — PROPAFENONE HCL 225 MG PO TABS
225.0000 mg | ORAL_TABLET | Freq: Two times a day (BID) | ORAL | Status: DC
  Administered 2020-04-09 – 2020-04-14 (×10): 225 mg via ORAL
  Filled 2020-04-09 (×11): qty 1

## 2020-04-09 MED ORDER — MAGNESIUM SULFATE 2 GM/50ML IV SOLN
2.0000 g | Freq: Once | INTRAVENOUS | Status: AC
  Administered 2020-04-09: 2 g via INTRAVENOUS
  Filled 2020-04-09: qty 50

## 2020-04-09 NOTE — H&P (Signed)
History and Physical    Ryan [name] CXK:[id number] DOB: 08/18/1946 DOA: 04/09/2020  Referring MD/NP/PA: Hermenia Fiscal PCP: [name], MD  Patient coming from: Home where he lives with his wife  Chief Complaint: Nausea, vomiting, and diarrhea  I have personally briefly reviewed patient's old medical records in [place]   HPI: Ryan [name] is a 74 y.o. male with medical history significant of HTN, HLD, atrial fibrillation/atrial flutter on Eliquis, anemia, and nephrolithiasis presents with complaints of nausea, vomiting, and diarrhea over the last 4 days.  Patient reports that he had eaten his normal peanut butter and salad dressing sandwiches as he is always done previously without issue.  However started having diarrhea and subsequently developed nausea and vomiting.  He was seen in the emergency department 2 days ago for urinary retention and a Foley catheter was placed.  He was advised to follow-up with urology but had not been able to set up appointment as of yet.  Denies any blood in his emesis, but states that his diarrhea is a reddish-yellow color now.  Associated symptoms included lower abdominal pain.  Today he had gotten so weak that he was unable to get up and therefore came into the hospital.  While in the hospital patient did complain of chest tightness  ED Course: Upon admission into the emergency department patient was noted to be afebrile with patient 16-22 and all other vital signs maintained. Labs significant for hemoglobin 12.3, potassium 2.9 , and troponin 241. Patient had been given 324 mg of aspirin and nitroglycerin with resolution of pain symptoms. Due to chest x-ray findings patient had been given azithromycin and  Rocephin.  Review of Systems  Constitutional: Positive for malaise/fatigue. Negative for fever.  HENT: Negative for congestion and ear discharge.   Eyes: Negative for photophobia and pain.  Respiratory: Negative for cough and shortness of  breath.   Cardiovascular: Positive for chest pain. Negative for leg swelling.  Gastrointestinal: Positive for abdominal pain, diarrhea, nausea and vomiting.  Genitourinary: Negative for dysuria, frequency and hematuria.  Musculoskeletal: Negative for falls.  Skin: Negative for itching.  Neurological: Positive for weakness. Negative for focal weakness and loss of consciousness.  Endo/Heme/Allergies: Negative for polydipsia.  Psychiatric/Behavioral: Negative for substance abuse.    Past Medical History:  Diagnosis Date  . A-fib ([place]) 03/22/2018  . Anemia   . Atrial flutter ([place]) 03/31/2012   converted spontaneously to sinus  . CAD (coronary artery disease)    reportedly moderate CAD; managed medically  . COPD (chronic obstructive pulmonary disease) ([place])   . Eczema 08/03/2014  . Edema 03/22/2018  . GERD (gastroesophageal reflux disease)   . H/O: rheumatic fever   . Heart murmur   . Heart murmur   . High risk medication use    on amiodarone since 03/31/2012  . History of colonoscopy   . Hyperglycemia 06/21/2017  . Hyperlipidemia   . Hypertension   . Kidney stone 06/21/2017  . Kidney stones   . Lobar pneumonia ([facility] [facility]) 03/22/2018  . Low vitamin D level 06/21/2017  . Macular degeneration   . Osteoporosis 05/31/2016    Past Surgical History:  Procedure Laterality Date  . APPENDECTOMY    . CARDIAC CATHETERIZATION  04/22/2011   moderate left main and RCA stenosis not significant by FFR and IVUS on medical therapy  . CARDIOVERSION N/A 04/25/2018   Procedure: CARDIOVERSION;  Surgeon: Skeet Latch, MD;  Location: [facility];  Service: Cardiovascular;  Laterality: N/A;  . CERVICAL  DISCECTOMY     C5,6,7 disc fused  with plate and 5 1" screws  . CHOLECYSTECTOMY    . colon polyp removal  3/1.19  . FOOT SURGERY     right calcification removed from top of foot  . knee cartiledge Right    right knee  . MOUTH SURGERY     periodontal surgery, bridges, splint in front,      reports  that he quit smoking about 6 years ago. His smoking use included cigarettes. He has a 25.00 pack-year smoking history. He has quit using smokeless tobacco. He reports that he does not drink alcohol and does not use drugs.  Allergies  Allergen Reactions  . Mucinex D [Pseudoephedrine-Guaifenesin Er] Anaphylaxis    Stomach cramps  . Other Nausea And Vomiting and Swelling    Eye drops made eyes swell CIPROFLOXIN-no reaction noted GUAIFENESIN PSEUDOEPHEDRIN- no reaction noted Other reaction(s): Other (See Comments) Stomach cramps Eye drops made eyes swell CIPROFLOXIN-no reaction noted GUAIFENESIN PSEUDOEPHEDRIN- no reaction noted  . Polymyxin B-Trimethoprim Swelling    Eye drops made eyes swell  . Pseudoephedrine Anaphylaxis and Hives    Stomach cramps Stomach cramps  . Codeine Hives, Itching and Rash  . Guaiacol     Other reaction(s): Other (See Comments) Hallucinations  . Polymyxin B Other (See Comments) and Swelling    Eye swelling Other reaction(s): Other (See Comments) Eye swelling  Eye swelling   Other reaction(s): Other (See Comments) Eye swelling   . Statins Other (See Comments)    Muscle cramps   . Atorvastatin Other (See Comments)    Muscle aches and cramps Other reaction(s): Other (See Comments) Muscle Aches  . Meloxicam Other (See Comments)    unknown Other reaction(s): Other (See Comments) Unknown reaction  . Pseudoephedrine-Guaifenesin Nausea And Vomiting    Stomach cramps  . Rosuvastatin Calcium Other (See Comments)    Muscle aches Other reaction(s): Other (See Comments) Muscle aches  . Simvastatin Other (See Comments)    Muscle aches and cramps Other reaction(s): Other (See Comments) Muscle aches and cramps  . Tapentadol Other (See Comments)    unknown  Other reaction(s): Other (See Comments) Unknown reaction  . Ciprofloxacin Hives, Itching, Nausea Only and Rash  . Moxifloxacin Nausea Only and Other (See Comments)    Stomach cramps Other  reaction(s): GI Intolerance, Other (See Comments) Headaches Stomach cramps headaches   . Rofecoxib     Stomach cramping  Other reaction(s): Other (See Comments) Stomach cramping    Family History  Problem Relation Age of Onset  . Heart disease Mother   . Diabetes Mother   . Cirrhosis Mother   . Emphysema Mother        never smoked but 2nd hand through her spouse  . Hypertension Mother   . Macular degeneration Mother   . Heart disease Father   . Cancer Father        prostate  . Hyperlipidemia Father   . Hypertension Father   . Varicose Veins Father   . Heart attack Father   . Peripheral vascular disease Father   . Heart disease Sister   . Arthritis Sister   . Hyperlipidemia Sister   . Obesity Sister   . Macular degeneration Sister   . Heart disease Brother        5 stents  . Hyperlipidemia Brother   . Macular degeneration Maternal Grandfather   . Cirrhosis Sister   . Obesity Sister   . Arthritis Sister   . Heart  disease Sister   . Obesity Sister   . Liver disease Other   . Prostate cancer Other   . Coronary artery disease Other     Prior to Admission medications   Medication Sig Start Date End Date Taking? Authorizing Provider  albuterol (VENTOLIN HFA) 108 (90 Base) MCG/ACT inhaler Inhale 1-2 puffs into the lungs every 6 (six) hours as needed for wheezing or shortness of breath. 09/14/19   [name], MD  amoxicillin (AMOXIL) 500 MG capsule Take 2,000 mg by mouth See admin instructions. Take 2000 mg 1 hour prior to dental work    [provider]  brimonidine (ALPHAGAN) 0.2 % ophthalmic solution Place 1 drop into the left eye in the morning and at bedtime.  09/10/17   [provider]  Cholecalciferol (VITAMIN D3) 5000 units CAPS Take 5,000 Units by mouth every Monday, Wednesday, and Friday.    [provider]  clobetasol (TEMOVATE) 0.05 % external solution APPLY 1 APPLICATION TOPICALLY TWICE DAILY Patient taking differently: Apply 1  application topically 2 (two) times daily as needed (psoriasis).  11/20/16   [name], MD  ELIQUIS 5 MG TABS tablet TAKE 1 TABLET BY MOUTH TWO  TIMES DAILY Patient taking differently: Take 5 mg by mouth 2 (two) times daily.  06/05/19   Evans Lance, MD  furosemide (LASIX) 20 MG tablet TAKE 1 TABLET BY MOUTH  DAILY Patient taking differently: Take 20 mg by mouth daily.  08/14/19   [name], MD  latanoprost (XALATAN) 0.005 % ophthalmic solution Place 1 drop into the left eye at bedtime.     [provider]  metoprolol tartrate (LOPRESSOR) 25 MG tablet TAKE 1 TABLET BY MOUTH  TWICE DAILY Patient taking differently: Take 25 mg by mouth 2 (two) times daily.  07/18/19   Evans Lance, MD  Multiple Vitamins-Minerals (ICAPS MV) TABS Take 1 tablet by mouth 2 (two) times daily.     [provider]  omeprazole (PRILOSEC OTC) 20 MG tablet Take 20 mg by mouth daily.    [provider]  potassium chloride (KLOR-CON) 10 MEQ tablet TAKE 1 TABLET BY MOUTH  DAILY Patient taking differently: Take 10 mEq by mouth daily.  08/14/19   Evans Lance, MD  propafenone (RYTHMOL) 225 MG tablet TAKE 1 TABLET BY MOUTH TWO  TIMES DAILY Patient taking differently: Take 225 mg by mouth in the morning and at bedtime.  09/25/19   Evans Lance, MD  Ranibizumab (LUCENTIS IO) Inject 1 Dose into the eye every 3 (three) months. (Macular Degeneration)    [provider]  tamsulosin (FLOMAX) 0.4 MG CAPS capsule Take 1 capsule (0.4 mg total) by mouth daily. Patient taking differently: Take 0.4 mg by mouth at bedtime.  07/25/17   Hedges, Dellis Filbert, PA-C  traMADol (ULTRAM) 50 MG tablet Take 1 tablet (50 mg total) by mouth every 6 (six) hours as needed. 04/08/20   Larene Pickett, PA-C    Physical Exam:  Constitutional: Elderly male who appears to be ill Vitals:   04/09/20 0903 04/09/20 1216 04/09/20 1250  BP: (!) 150/69 111/63 (!) 110/58  Pulse: 96 69 63  Resp: 20 16 (!) 22  Temp:  98.8 F (37.1 C)    TempSrc: Oral    SpO2: 95% 95% 96%   Eyes: PERRL, lids and conjunctivae normal ENMT: Mucous membranes are dry. Posterior pharynx clear of any exudate or lesions.  Neck: normal, supple, no masses, no thyromegaly Respiratory: Mildly tachypneic,  but no crackles or wheezes appreciated.  No accessory muscle use.  Patient able to talk in complete sentences. Cardiovascular: Regular rate and rhythm, no murmurs / rubs / gallops. No extremity edema. 2+ pedal pulses. No carotid bruits.  Abdomen: Moderate abdominal tenderness appreciated tenderness, no masses palpated. No hepatosplenomegaly. Bowel sounds positive.  Musculoskeletal: no clubbing / cyanosis. No joint deformity upper and lower extremities. Good ROM, no contractures. Normal muscle tone.  Skin: no rashes, lesions, ulcers. No induration Neurologic: CN 2-12 grossly intact. Sensation intact, DTR normal. Strength 5/5 in all 4.  Psychiatric: Normal judgment and insight. Alert and oriented x 3.  Anxious mood.     Labs on Admission: I have personally reviewed following labs and imaging studies  CBC: Recent Labs  Lab 04/09/20 0950  WBC 9.4  NEUTROABS 6.8  HGB 12.3*  HCT 38.1*  MCV 93.6  PLT 025   Basic Metabolic Panel: Recent Labs  Lab 04/09/20 0950  NA 136  K 2.9*  CL 100  CO2 22  GLUCOSE 96  BUN 10  CREATININE 1.02  CALCIUM 7.1*   GFR: CrCl cannot be calculated (Unknown ideal weight.). Liver Function Tests: Recent Labs  Lab 04/09/20 0950  AST 23  ALT 16  ALKPHOS 50  BILITOT 1.4*  PROT 6.3*  ALBUMIN 3.2*   Recent Labs  Lab 04/09/20 0950  LIPASE 29   No results for input(s): AMMONIA in the last 168 hours. Coagulation Profile: Recent Labs  Lab 04/09/20 0950  INR 1.3*   Cardiac Enzymes: No results for input(s): CKTOTAL, CKMB, CKMBINDEX, TROPONINI in the last 168 hours. BNP (last 3 results) No results for input(s): PROBNP in the last 8760 hours. HbA1C: No results for input(s): HGBA1C  in the last 72 hours. CBG: Recent Labs  Lab 04/09/20 0857  GLUCAP 100*   Lipid Profile: No results for input(s): CHOL, HDL, LDLCALC, TRIG, CHOLHDL, LDLDIRECT in the last 72 hours. Thyroid Function Tests: No results for input(s): TSH, T4TOTAL, FREET4, T3FREE, THYROIDAB in the last 72 hours. Anemia Panel: No results for input(s): VITAMINB12, FOLATE, FERRITIN, TIBC, IRON, RETICCTPCT in the last 72 hours. Urine analysis:    Component Value Date/Time   COLORURINE YELLOW 04/09/2020 1025   APPEARANCEUR CLEAR 04/09/2020 1025   LABSPEC 1.004 (L) 04/09/2020 1025   PHURINE 7.0 04/09/2020 1025   GLUCOSEU NEGATIVE 04/09/2020 1025   GLUCOSEU NEGATIVE 12/27/2017 1157   HGBUR LARGE (A) 04/09/2020 1025   HGBUR negative 03/15/2009 0804   BILIRUBINUR NEGATIVE 04/09/2020 1025   BILIRUBINUR n 12/01/2013 1136   KETONESUR 5 (A) 04/09/2020 1025   PROTEINUR NEGATIVE 04/09/2020 1025   UROBILINOGEN 0.2 12/27/2017 1157   NITRITE NEGATIVE 04/09/2020 1025   LEUKOCYTESUR SMALL (A) 04/09/2020 1025   Sepsis Labs: Recent Results (from the past 240 hour(s))  Urine culture     Status: None   Collection Time: 04/08/20  5:41 AM   Specimen: Urine, Clean Catch  Result Value Ref Range Status   Specimen Description   Final    URINE, CLEAN CATCH Performed at [facility], [place] [address] [place], [place] [address]    Special Requests   Final    NONE Performed at [facility], [place] [address], [place], [place] [address]    Culture   Final    NO GROWTH Performed at [facility], [place] [address] [place], [place] [address]    Report Status 04/09/2020 FINAL  Final     Radiological Exams on Admission: CT  ABDOMEN PELVIS W CONTRAST  Result Date: 04/09/2020 CLINICAL DATA:  Severe lower abdominal pain since last Friday with nausea, vomiting and diarrhea, suspected abdominal abscess/infection; history cholecystectomy, appendectomy, former smoker, hypertension, kidney  stones, COPD, GERD, coronary artery disease, atrial fibrillation, alcohol abuse EXAM: CT ABDOMEN AND PELVIS WITH CONTRAST TECHNIQUE: Multidetector CT imaging of the abdomen and pelvis was performed using the standard protocol following bolus administration of intravenous contrast. Sagittal and coronal MPR images reconstructed from axial data set. Exam utilized mA and/or kV adjustment based on patient size in order to minimize patient radiation dose. CONTRAST:  114mL OMNIPAQUE IOHEXOL 300 MG/ML SOLN IV. No oral contrast. COMPARISON:  03/24/2018 FINDINGS: Lower chest: Emphysematous changes with chronic interstitial disease at the posterior lung bases. Hepatobiliary: Gallbladder surgically absent. No focal hepatic abnormalities Pancreas: Tiny calcification at pancreatic body. Pancreas otherwise normal appearance Spleen: Normal appearance Adrenals/Urinary Tract: Adrenal glands, kidneys, and ureters normal appearance. Foley catheter decompresses urinary bladder. Bladder wall appears thickened, may be related to artifact from underdistention but other causes of wall thickening not excluded. Stomach/Bowel: Appendix surgically absent by history. Sigmoid diverticulosis without evidence of diverticulitis. Large and small bowel loops otherwise unremarkable. Marked diffuse gastric wall thickening, may reflect gastritis but infiltrative processes and tumor not excluded. Vascular/Lymphatic: Atherosclerotic calcifications aorta and iliac arteries as well as femoral in coronary arteries. Aorta normal caliber. No adenopathy. Reproductive: Mild prostatic enlargement gland measuring 4.4 x 3.8 cm image 76 Other: Small LEFT inguinal hernia containing fat. Tiny umbilical hernia containing fat. No free air or free fluid. Musculoskeletal: Osseous demineralization. IMPRESSION: Sigmoid diverticulosis without evidence of diverticulitis. Mild prostatic enlargement. Small LEFT inguinal and tiny umbilical hernias containing fat. Marked diffuse  gastric wall thickening question gastritis though infiltrative processes and tumor are not excluded; follow-up endoscopy recommended to exclude tumor. 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 Aortic Atherosclerosis (ICD10-I70.0) and Emphysema (ICD10-J43.9). Electronically Signed   By: Lavonia Dana M.D.   On: 04/09/2020 13:03   DG Chest Portable 1 View  Result Date: 04/09/2020 CLINICAL DATA:  Chest pain EXAM: PORTABLE CHEST 1 VIEW COMPARISON:  March 22, 2018 FINDINGS: There is somewhat ill-defined airspace opacity in the right base region. Lungs elsewhere are clear. Heart is upper normal in size with pulmonary vascularity normal. No adenopathy. Postoperative change noted in the lower cervical region. IMPRESSION: Ill-defined opacity consistent with pneumonia right base. This area is somewhat less well-defined than on previous study, but there appears to be overall slight increase in opacity in this area compared to the previous study. Lungs elsewhere clear. Stable cardiac silhouette. No adenopathy. Followup PA and lateral chest radiographs recommended in 3-4 weeks following trial of antibiotic therapy to ensure resolution and exclude underlying malignancy. Electronically Signed   By: Lowella Grip III M.D.   On: 04/09/2020 12:11    EKG: Independently reviewed. Sinus rhythm at 69 bpm  Assessment/Plan Atypical chest pain, elevated troponin: Acute.   After having episodes of vomiting patient reported complaints of chest pain. Troponins initially noted to be elevated up to 241-> 187.  EKG without significant ischemic changes.  Cardiology evaluated and recommended no further work-up as troponins appeared to be trending down. Suspect secondary to demand. -Admit to a cardiac telemetry bed -Appreciate cardiology consultative services, we will follow-up further recommendation  Nausea, vomiting, and diarrhea: Patient presents with nausea, vomiting, and diarrhea over the last 4  days.  With some tenderness palpation of the lower abdomen.  CT scan of the abdomen pelvis concerning for the  possibility of gastritis.    -Monitor intake and output -Clear liquid diet and advance as tolerated -Protonix IV  Suspected aspiration: Patient noted to have opacity of the right lower lobe. Given patient recent nausea and vomiting suspected aspiration cause of symptoms. Patient has been started empirically on Rocephin and azithromycin. -Aspiration precautions -Continue Rocephin and azithromycin   Paroxysmal atrial fibrillation on chronic anticoagulation: Patient appears to be in sinus rhythm at this time. -Goal potassium 4 and magnesium 2 -Continue Eliquis, Lopressor, and Rythmol  Hypokalemia: Acute initial potassium noted to be as low as 2.9. -Check magnesium level (0.4) -Give potassium chloride 40 mEq IV and 40 mEq p.o.   Hypomagnesemia: Acute.  Magnesium level noted to be 0.4. -Give 2 g of magnesium sulfate -Continue to monitor and replace as needed  Essential hypertension into the 160s. -Continue home blood pressure medications as seen above.  Urinary retention, history of BPH: Just recently came to the emergency department and required placement of Foley catheter for urinary retention.  He was advised to follow-up with urology in the outpatient setting but has not done so yet.  Urinalysis did not show any signs of infection. -Bladder training -Remove Foley catheter  -Continue Flomax -Bladder scan every shift  DVT prophylaxis: Eliquis Code Status: Full Family Communication: Wife updated at bedside  Disposition Plan: Possible discharge home in 1 to 2 days Consults called: Cardiology Admission status: Observation  [name] MD [facility] Pager [phone]   If 7PM-7AM, please contact night-coverage [email or web address] Password [email or web address]  04/09/2020, 1:21 PM

## 2020-04-09 NOTE — ED Provider Notes (Signed)
[facility] EMERGENCY DEPARTMENT Provider Note   CSN: [id number] Arrival date & time: 04/09/20  3785     History Chief Complaint  Patient presents with  . Emesis    Ryan [name] is a 74 y.o. male history of A. fib on Coumadin, CAD, COPD, GERD, hyperlipidemia, hypertension, kidney stones, CHF, osteoporosis, PAD, appendectomy, cholecystectomy.  Patient presents today for nausea vomiting diarrhea and generalized weakness.  Reports symptom onset 5 days ago gradually worsening.  He had urinary retention 2 days ago and was evaluated in the ER, had urinary catheter placed.  Since that time has had worsening vomiting and diarrhea.  Reports nonbloody/nonbilious emesis a few times a day.  Reports red-yellow appearing diarrhea for the past 2 days.  Patient denied any pain upon arrival however with palpation of the abdomen he endorsed severe pain and had guarding.  Reports is not present prior to this evaluation.  Reports tactile fevers and chills, denies headache, neck pain, chest pain/shortness of breath, cough/hemoptysis, dysuria, testicular pain/swelling, fall/injury or any additional concerns. HPI     Past Medical History:  Diagnosis Date  . A-fib ([place]) 03/22/2018  . Anemia   . Atrial flutter ([place]) 03/31/2012   converted spontaneously to sinus  . CAD (coronary artery disease)    reportedly moderate CAD; managed medically  . COPD (chronic obstructive pulmonary disease) ([place])   . Eczema 08/03/2014  . Edema 03/22/2018  . GERD (gastroesophageal reflux disease)   . H/O: rheumatic fever   . Heart murmur   . Heart murmur   . High risk medication use    on amiodarone since 03/31/2012  . History of colonoscopy   . Hyperglycemia 06/21/2017  . Hyperlipidemia   . Hypertension   . Kidney stone 06/21/2017  . Kidney stones   . Lobar pneumonia ([place]) 03/22/2018  . Low vitamin D level 06/21/2017  . Macular degeneration   . Osteoporosis 05/31/2016    Patient Active Problem List    Diagnosis Date Noted  . Elevated troponin 04/09/2020  . Statin intolerance 12/19/2019  . Vitamin D deficiency 09/18/2019  . CHF (congestive heart failure) ([place]) 03/22/2018  . A-fib ([place]) 03/22/2018  . Edema 03/22/2018  . H/O: rheumatic fever 12/27/2017  . Low vitamin D level 06/21/2017  . Hyperglycemia 06/21/2017  . Kidney stone 06/21/2017  . 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 Osteoporosis 05/31/2016  . BPH (benign prostatic hyperplasia) 01/20/2015  . Erectile dysfunction 01/20/2015  . Rectal bleeding 01/20/2015  . Preventative health care 01/20/2015  . Eczema 08/03/2014  . Sleep apnea 03/01/2014  . Anticoagulation adequate with anticoagulant therapy 03/01/2014  . Pre-syncope 02/26/2014  . Abdominal pain 02/26/2014  . PAD (peripheral artery disease) ([place]) 02/26/2014  . Constipation 03/06/2013  . Easy bruising 03/06/2013  . History of alcohol abuse 04/15/2012  . CAD (coronary atherosclerotic disease) 05/08/2011  . 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 UNS ADVRS EFF UNS RX MEDICINAL&BIOLOGICAL SBSTNC 11/11/2009  . H/O tobacco use, presenting hazards to health 07/29/2009  . COPD GOLD I 07/29/2009  . Hyperlipidemia, mixed 06/20/2007  . Macular degeneration (senile) of retina 06/20/2007  . Essential hypertension 06/20/2007  . GERD 06/16/2007    Past Surgical History:  Procedure Laterality Date  . APPENDECTOMY    . CARDIAC CATHETERIZATION  04/22/2011   moderate left main and RCA stenosis not significant by FFR and IVUS on medical therapy  . CARDIOVERSION N/A 04/25/2018   Procedure: CARDIOVERSION;  Surgeon: Oval Linsey,  Jonelle Sidle, MD;  Location: [facility];  Service: Cardiovascular;  Laterality: N/A;  . CERVICAL DISCECTOMY     C5,6,7 disc fused  with plate and 5 1" screws  . CHOLECYSTECTOMY    . colon polyp removal  3/1.19  . FOOT SURGERY     right calcification removed from top of foot  . knee cartiledge Right    right  knee  . MOUTH SURGERY     periodontal surgery, bridges, splint in front,        Family History  Problem Relation Age of Onset  . Heart disease Mother   . Diabetes Mother   . Cirrhosis Mother   . Emphysema Mother        never smoked but 2nd hand through her spouse  . Hypertension Mother   . Macular degeneration Mother   . Heart disease Father   . Cancer Father        prostate  . Hyperlipidemia Father   . Hypertension Father   . Varicose Veins Father   . Heart attack Father   . Peripheral vascular disease Father   . Heart disease Sister   . Arthritis Sister   . Hyperlipidemia Sister   . Obesity Sister   . Macular degeneration Sister   . Heart disease Brother        5 stents  . Hyperlipidemia Brother   . Macular degeneration Maternal Grandfather   . Cirrhosis Sister   . Obesity Sister   . Arthritis Sister   . Heart disease Sister   . Obesity Sister   . Liver disease Other   . Prostate cancer Other   . Coronary artery disease Other     Social History   Tobacco Use  . Smoking status: Former Smoker    Packs/day: 0.50    Years: 50.00    Pack years: 25.00    Types: Cigarettes    Quit date: 05/01/2013    Years since quitting: 6.9  . Smokeless tobacco: Former Systems developer  . Tobacco comment: using e-[employer]ig[employer]ldc  Vaping Use  . Vaping Use: Never used  Substance Use Topics  . Alcohol use: No  . Drug use: No    Home Medications Prior to Admission medications   Medication Sig Start Date End Date Taking? Authorizing Provider  albuterol (VENTOLIN HFA) 108 (90 Base) MCG/ACT inhaler Inhale 1-2 puffs into the lungs every 6 (six) hours as needed for wheezing or shortness of breath. 09/14/19   [name], MD  amoxicillin (AMOXIL) 500 MG capsule Take 2,000 mg by mouth See admin instructions. Take 2000 mg 1 hour prior to dental work    [provider]  brimonidine (ALPHAGAN) 0.2 % ophthalmic solution Place 1 drop into the left eye in the morning and at bedtime.  09/10/17    [provider]  Cholecalciferol (VITAMIN D3) 5000 units CAPS Take 5,000 Units by mouth every Monday, Wednesday, and Friday.    [provider]  clobetasol (TEMOVATE) 0.05 % external solution APPLY 1 APPLICATION TOPICALLY TWICE DAILY Patient taking differently: Apply 1 application topically 2 (two) times daily as needed (psoriasis).  11/20/16   [name], MD  ELIQUIS 5 MG TABS tablet TAKE 1 TABLET BY MOUTH TWO  TIMES DAILY Patient taking differently: Take 5 mg by mouth 2 (two) times daily.  06/05/19   Evans Lance, MD  furosemide (LASIX) 20 MG tablet TAKE 1 TABLET BY MOUTH  DAILY Patient taking differently: Take 20 mg by mouth daily.  08/14/19  [name], MD  latanoprost (XALATAN) 0.005 % ophthalmic solution Place 1 drop into the left eye at bedtime.     [provider]  metoprolol tartrate (LOPRESSOR) 25 MG tablet TAKE 1 TABLET BY MOUTH  TWICE DAILY Patient taking differently: Take 25 mg by mouth 2 (two) times daily.  07/18/19   Evans Lance, MD  Multiple Vitamins-Minerals (ICAPS MV) TABS Take 1 tablet by mouth 2 (two) times daily.     [provider]  omeprazole (PRILOSEC OTC) 20 MG tablet Take 20 mg by mouth daily.    [provider]  potassium chloride (KLOR-CON) 10 MEQ tablet TAKE 1 TABLET BY MOUTH  DAILY Patient taking differently: Take 10 mEq by mouth daily.  08/14/19   Evans Lance, MD  propafenone (RYTHMOL) 225 MG tablet TAKE 1 TABLET BY MOUTH TWO  TIMES DAILY Patient taking differently: Take 225 mg by mouth in the morning and at bedtime.  09/25/19   Evans Lance, MD  Ranibizumab (LUCENTIS IO) Inject 1 Dose into the eye every 3 (three) months. (Macular Degeneration)    [provider]  tamsulosin (FLOMAX) 0.4 MG CAPS capsule Take 1 capsule (0.4 mg total) by mouth daily. Patient taking differently: Take 0.4 mg by mouth at bedtime.  07/25/17   Hedges, Dellis Filbert, PA-C  traMADol (ULTRAM) 50 MG tablet Take 1 tablet (50  mg total) by mouth every 6 (six) hours as needed. 04/08/20   Larene Pickett, PA-C    Allergies    Mucinex d [pseudoephedrine-guaifenesin er], Other, Polymyxin b-trimethoprim, Pseudoephedrine, Codeine, Guaiacol, Polymyxin b, Statins, Atorvastatin, Meloxicam, Pseudoephedrine-guaifenesin, Rosuvastatin calcium, Simvastatin, Tapentadol, Ciprofloxacin, Moxifloxacin, and Rofecoxib  Review of Systems   Review of Systems Ten systems are reviewed and are negative for acute change except as noted in the HPI  Physical Exam Updated Vital Signs BP (!) 110/58   Pulse 63   Temp 98.8 F (37.1 C) (Oral)   Resp (!) 22   SpO2 96%   Physical Exam Constitutional:      General: He is not in acute distress.    Appearance: Normal appearance. He is well-developed. He is not toxic-appearing or diaphoretic.     Comments: Uncomfortable appearing  HENT:     Head: Normocephalic and atraumatic.  Eyes:     General: Vision grossly intact. Gaze aligned appropriately.     Pupils: Pupils are equal, round, and reactive to light.  Neck:     Trachea: Trachea and phonation normal.  Cardiovascular:     Rate and Rhythm: Normal rate and regular rhythm.  Pulmonary:     Effort: Pulmonary effort is normal. No respiratory distress.  Abdominal:     General: There is no distension.     Palpations: Abdomen is soft.     Tenderness: There is generalized abdominal tenderness. There is guarding. There is no rebound.  Genitourinary:    Comments: Clean appearing Foley catheter without evidence of infection or bleeding. - Rectal examination chaperoned by Gerald Stabs nurse tech.  No external hemorrhoids, fissures or evidence of infection.  Normal rectal tone.  No palpable internal abnormalities.  No gross blood on examination. Musculoskeletal:        General: Normal range of motion.     Cervical back: Normal range of motion.  Skin:    General: Skin is warm and dry.  Neurological:     Mental Status: He is alert.     GCS: GCS eye  subscore is 4. GCS verbal subscore is 5. GCS  motor subscore is 6.     Comments: Speech is clear and goal oriented, follows commands Major Cranial nerves without deficit, no facial droop Moves extremities without ataxia, coordination intact  Psychiatric:        Behavior: Behavior normal.     ED Results / Procedures / Treatments   Labs (all labs ordered are listed, but only abnormal results are displayed) Labs Reviewed  CBC WITH DIFFERENTIAL/PLATELET - Abnormal; Notable for the following components:      Result Value   RBC 4.07 (*)    Hemoglobin 12.3 (*)    HCT 38.1 (*)    Abs Immature Granulocytes 0.08 (*)    All other components within normal limits  COMPREHENSIVE METABOLIC PANEL - Abnormal; Notable for the following components:   Potassium 2.9 (*)    Calcium 7.1 (*)    Total Protein 6.3 (*)    Albumin 3.2 (*)    Total Bilirubin 1.4 (*)    All other components within normal limits  URINALYSIS, ROUTINE W REFLEX MICROSCOPIC - Abnormal; Notable for the following components:   Specific Gravity, Urine 1.004 (*)    Hgb urine dipstick LARGE (*)    Ketones, ur 5 (*)    Leukocytes,Ua SMALL (*)    All other components within normal limits  APTT - Abnormal; Notable for the following components:   aPTT 38 (*)    All other components within normal limits  PROTIME-INR - Abnormal; Notable for the following components:   Prothrombin Time 16.0 (*)    INR 1.3 (*)    All other components within normal limits  CBG MONITORING, ED - Abnormal; Notable for the following components:   Glucose-Capillary 100 (*)    All other components within normal limits  TROPONIN I (HIGH SENSITIVITY) - Abnormal; Notable for the following components:   Troponin I (High Sensitivity) 241 (*)    All other components within normal limits  URINE CULTURE  CULTURE, BLOOD (ROUTINE X 2)  CULTURE, BLOOD (ROUTINE X 2)  SARS CORONAVIRUS 2 BY RT PCR (HOSPITAL ORDER, [facility])  LIPASE, BLOOD   LACTIC ACID, PLASMA  LACTIC ACID, PLASMA  MAGNESIUM  LIPID PANEL  POC OCCULT BLOOD, ED  TROPONIN I (HIGH SENSITIVITY)    EKG None  Radiology CT ABDOMEN PELVIS W CONTRAST  Result Date: 04/09/2020 CLINICAL DATA:  Severe lower abdominal pain since last Friday with nausea, vomiting and diarrhea, suspected abdominal abscess/infection; history cholecystectomy, appendectomy, former smoker, hypertension, kidney stones, COPD, GERD, coronary artery disease, atrial fibrillation, alcohol abuse EXAM: CT ABDOMEN AND PELVIS WITH CONTRAST TECHNIQUE: Multidetector CT imaging of the abdomen and pelvis was performed using the standard protocol following bolus administration of intravenous contrast. Sagittal and coronal MPR images reconstructed from axial data set. Exam utilized mA and/or kV adjustment based on patient size in order to minimize patient radiation dose. CONTRAST:  126mL OMNIPAQUE IOHEXOL 300 MG/ML SOLN IV. No oral contrast. COMPARISON:  03/24/2018 FINDINGS: Lower chest: Emphysematous changes with chronic interstitial disease at the posterior lung bases. Hepatobiliary: Gallbladder surgically absent. No focal hepatic abnormalities Pancreas: Tiny calcification at pancreatic body. Pancreas otherwise normal appearance Spleen: Normal appearance Adrenals/Urinary Tract: Adrenal glands, kidneys, and ureters normal appearance. Foley catheter decompresses urinary bladder. Bladder wall appears thickened, may be related to artifact from underdistention but other causes of wall thickening not excluded. Stomach/Bowel: Appendix surgically absent by history. Sigmoid diverticulosis without evidence of diverticulitis. Large and small bowel loops otherwise unremarkable. Marked diffuse gastric wall thickening, may  reflect gastritis but infiltrative processes and tumor not excluded. Vascular/Lymphatic: Atherosclerotic calcifications aorta and iliac arteries as well as femoral in coronary arteries. Aorta normal caliber. No  adenopathy. Reproductive: Mild prostatic enlargement gland measuring 4.4 x 3.8 cm image 76 Other: Small LEFT inguinal hernia containing fat. Tiny umbilical hernia containing fat. No free air or free fluid. Musculoskeletal: Osseous demineralization. IMPRESSION: Sigmoid diverticulosis without evidence of diverticulitis. Mild prostatic enlargement. Small LEFT inguinal and tiny umbilical hernias containing fat. Marked diffuse gastric wall thickening question gastritis though infiltrative processes and tumor are not excluded; follow-up endoscopy recommended to exclude tumor. 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 Aortic Atherosclerosis (ICD10-I70.0) and Emphysema (ICD10-J43.9). Electronically Signed   By: Lavonia Dana M.D.   On: 04/09/2020 13:03   DG Chest Portable 1 View  Result Date: 04/09/2020 CLINICAL DATA:  Chest pain EXAM: PORTABLE CHEST 1 VIEW COMPARISON:  March 22, 2018 FINDINGS: There is somewhat ill-defined airspace opacity in the right base region. Lungs elsewhere are clear. Heart is upper normal in size with pulmonary vascularity normal. No adenopathy. Postoperative change noted in the lower cervical region. IMPRESSION: Ill-defined opacity consistent with pneumonia right base. This area is somewhat less well-defined than on previous study, but there appears to be overall slight increase in opacity in this area compared to the previous study. Lungs elsewhere clear. Stable cardiac silhouette. No adenopathy. Followup PA and lateral chest radiographs recommended in 3-4 weeks following trial of antibiotic therapy to ensure resolution and exclude underlying malignancy. Electronically Signed   By: Lowella Grip III M.D.   On: 04/09/2020 12:11    Procedures .Critical Care Performed by: Deliah Boston, PA-C Authorized by: Deliah Boston, PA-C   Critical care provider statement:    Critical care time (minutes):  40   Critical care was time spent personally by me  on the following activities:  Discussions with consultants, evaluation of patient's response to treatment, examination of patient, ordering and performing treatments and interventions, ordering and review of laboratory studies, ordering and review of radiographic studies, pulse oximetry, re-evaluation of patient's condition, obtaining history from patient or surrogate, review of old charts and development of treatment plan with patient or surrogate   (including critical care time)  Medications Ordered in ED Medications  nitroGLYCERIN (NITROSTAT) SL tablet 0.4 mg (0.4 mg Sublingual Given 04/09/20 1138)  azithromycin (ZITHROMAX) 500 mg in sodium chloride 0.9 % 250 mL IVPB (has no administration in time range)  potassium chloride 10 mEq in 100 mL IVPB (has no administration in time range)  sodium chloride flush (NS) 0.9 % injection 3 mL (has no administration in time range)  0.9 %  sodium chloride infusion (has no administration in time range)  ondansetron (ZOFRAN) tablet 4 mg (has no administration in time range)    Or  ondansetron (ZOFRAN) injection 4 mg (has no administration in time range)  sodium chloride 0.9 % bolus 500 mL (0 mLs Intravenous Stopped 04/09/20 1042)  cefTRIAXone (ROCEPHIN) 1 g in sodium chloride 0.9 % 100 mL IVPB (0 g Intravenous Stopped 04/09/20 1041)  fentaNYL (SUBLIMAZE) injection 50 mcg (50 mcg Intravenous Given 04/09/20 1043)  aspirin chewable tablet 324 mg (324 mg Oral Given 04/09/20 1138)  iohexol (OMNIPAQUE) 300 MG/ML solution 100 mL (100 mLs Intravenous Contrast Given 04/09/20 1207)    ED Course  I have reviewed the triage vital signs and the nursing notes.  Pertinent labs & imaging results that were available during my care of the patient were reviewed  by me and considered in my medical decision making (see chart for details).  Clinical Course as of Apr 10 1343  Tue Apr 09, 2020  1133 Trish   [BM]  1309 Dr. Tamala Julian   [BM]    Clinical Course User Index [BM] Gari Crown   MDM Rules/Calculators/A&P                          Additional History Obtained: 1. Nursing notes from this visit. 2. Reviewed ED visit from yesterday 04/08/2020.  Patient arrived at 4:34 AM.  Urinalysis without evidence of infection.  Foley catheter was inserted, drained approximately 200 cc.  He was discharged with pain medication encouraged to follow-up with urology. 3. Family, patient's wife at bedside corroborates story. ---------- 74 year old male arrives for nausea vomiting diarrhea worsening since placement of Foley catheter 2 days ago.  Initially denied any pain however on examination he had abdominal pain which he reports is new only since this evaluation.  On examination he is uncomfortable appearing mildly tachycardic concern for possible infection at this time he replaced pain at the Foley catheter.  1 g IV Rocephin ordered empirically.  Labs for sepsis rule out ordered.  We will additionally obtain CT of the abdomen/pelvis for evaluation of patient's pain.  Fluid bolus was ordered in addition to fentanyl for pain. - Shortly after receiving fentanyl patient endorsed development of chest pain which prompted a chest pain work-up.  He described as substernal pressure he reports he gets this every so often and thinks it is due to his A. fib.  He reports this chest pain only really occurs when he is under a lot of stress.  Troponin, chest x-ray and EKG were added. - I ordered, reviewed and interpreted labs which include: CBC shows no leukocytosis to suggest infection, anemia of 12.3 appears nonacute. Lactic acid of 1.4 is reassuring. CMP shows hypokalemia of 2.9, no emergent electrolyte derangement, evidence of acute kidney injury, emergent elevation of LFTs or anion gap. Lipase within normal limits, doubt pancreatitis. Hemoccult negative. CBG 100, reassuring. Urinalysis shows blood and ketones likely secondary to recent Foley placement, no evidence of infection but  culture was sent. APTT and PT/INR somewhat elevated. Troponin 241, discussed with Trish from cardiology at 11:33 AM, she advised admission to hospitalist service, cardiology will see patient.  EKG reviewed with Dr. Alvino Chapel shows no acute ischemic changes.  Chest x-ray:  IMPRESSION:  Ill-defined opacity consistent with pneumonia right base. This area  is somewhat less well-defined than on previous study, but there  appears to be overall slight increase in opacity in this area  compared to the previous study. Lungs elsewhere clear. Stable  cardiac silhouette. No adenopathy.    Followup PA and lateral chest radiographs recommended in 3-4 weeks  following trial of antibiotic therapy to ensure resolution and  exclude underlying malignancy.  I personally reviewed patient's chest x-ray and agree with radiologist interpretation.  Azithromycin was added for coverage of community-acquired pneumonia.  CT AP:  IMPRESSION:  Sigmoid diverticulosis without evidence of diverticulitis.    Mild prostatic enlargement.    Small LEFT inguinal and tiny umbilical hernias containing fat.    Marked diffuse gastric wall thickening question gastritis though  infiltrative processes and tumor are not excluded; follow-up  endoscopy recommended to exclude tumor.    Emphysematous lung bases with chronic basilar interstitial lung  disease.    Aortic Atherosclerosis (ICD10-I70.0) and Emphysema (ICD10-J43.9).  ---- Patient  was reassessed multiple times remained stable throughout visit.  He received aspirin and nitroglycerin and chest pain resolved.  He is agreeable to a hospitalist admission.  At this time concern for possible ACS/angina etiology of his chest pain, given that he is anticoagulated and without tachycardia or hypoxia low suspicion for pulmonary embolism at this time.  Patient was seen and evaluated by Dr. Alvino Chapel who agrees with hospitalist admission.  Discussed the case with Dr. Tamala Julian at  1:09 PM, patient was accepted to hospitalist service.   Note: Portions of this report may have been transcribed using voice recognition software. Every effort was made to ensure accuracy; however, inadvertent computerized transcription errors may still be present.  Final Clinical Impression(s) / ED Diagnoses Final diagnoses:  Elevated troponin    Rx / DC Orders ED Discharge Orders    None       Gari Crown 04/09/20 1345    Davonna Belling, MD 04/10/20 1620

## 2020-04-09 NOTE — Consult Note (Signed)
Cardiology Consultation:   Patient ID: Ryan [name] MRN: [id number]; DOB: Aug 18, 1946  Admit date: 04/09/2020 Date of Consult: 04/09/2020  Primary Care Provider: [name], MD [facility] Cardiologist: Ryan Peru, MD   Patient Profile:   Ryan [name] is a 74 y.o. 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 Tamala [name].   Cath 04/2011: Moderate left main and right coronary artery stenosis, which is not significant by FFR and IVUS interrogation. Normal ETT 06/2017. Echo 03/2018: LVEF of 55-60%, no WM abnormality  Intolerance to amiodarone due to GI side effects.   Last seen by Ryan [name] 06/2019. Per note "Chest pain - he has brief sharp, non-exertional stabbing pains which last seconds. 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 His ECG is abnormal and I have asked him to watch out for changes in symptoms. If his symptoms worsen with lower activity levels, then I would consider left heart cath".  History of Present Illness:   Ryan [name] was started to having foul smelling diarrhea for past 2 days.  It was getting worse and seen in ER yesterday for urinary retention and worsening diarrhea, paced on catheter and discharged.  Later yesterday he developed a multiple episode of vomiting and diarrhea again.  This morning he was very lethargic and drowsy and presented by EMS  In ER K 2.9 Scr 1.02 INR 1.3 Pending blood culture CT of abdomen pelvis with possible gastritis CXR with possible pneumonia >> on Abx  Started on broad-spectrum antibiotic.  This morning around 10 AM in ER he had a worse episode of dry heaves followed by epigastric area/lower sternal area dull sensation of pressure.  He was given sublingual nitroglycerin x1 with improved  symptoms.  Troponin came back 241.  Repeat pending.  Cardiology is asked for further evaluation.  Patient reports that his exertional chest discomfort has been resolved.  No sick contact.  He has been vaccinated.  Past Medical History:  Diagnosis Date  . A-fib ([place]) 03/22/2018  . Anemia   . Atrial flutter ([place]) 03/31/2012   converted spontaneously to sinus  . CAD (coronary artery disease)    reportedly moderate CAD; managed medically  . COPD (chronic obstructive pulmonary disease) ([place])   . Eczema 08/03/2014  . Edema 03/22/2018  . GERD (gastroesophageal reflux disease)   . H/O: rheumatic fever   . Heart murmur   . Heart murmur   . High risk medication use    on amiodarone since 03/31/2012  . History of colonoscopy   . Hyperglycemia 06/21/2017  . Hyperlipidemia   . Hypertension   . Kidney stone 06/21/2017  . Kidney stones   . Lobar pneumonia ([place]) 03/22/2018  . Low vitamin D level 06/21/2017  . Macular degeneration   . Osteoporosis 05/31/2016    Past Surgical History:  Procedure Laterality Date  . APPENDECTOMY    . CARDIAC CATHETERIZATION  04/22/2011   moderate left main and RCA stenosis not significant by FFR and IVUS on medical therapy  . CARDIOVERSION N/A 04/25/2018   Procedure: CARDIOVERSION;  Surgeon: Skeet Latch, MD;  Location: [facility] ENDOSCOPY;  Service: Cardiovascular;  Laterality: N/A;  . CERVICAL DISCECTOMY     C5,6,7 disc fused  with plate and 5 1" screws  .  CHOLECYSTECTOMY    . colon polyp removal  3/1.19  . FOOT SURGERY     right calcification removed from top of foot  . knee cartiledge Right    right knee  . MOUTH SURGERY     periodontal surgery, bridges, splint in front,      Inpatient Medications: Scheduled Meds: . sodium chloride flush  3 mL Intravenous Q12H   Continuous Infusions: . sodium chloride    . azithromycin (ZITHROMAX) 500 MG IVPB (Vial-Mate [employer])    . potassium chloride     PRN Meds: nitroGLYCERIN, ondansetron **OR** ondansetron  (ZOFRAN) IV  Allergies:    Allergies  Allergen Reactions  . Mucinex D [Pseudoephedrine-Guaifenesin Er] Anaphylaxis    Stomach cramps  . Other Nausea And Vomiting and Swelling    Eye drops made eyes swell CIPROFLOXIN-no reaction noted GUAIFENESIN PSEUDOEPHEDRIN- no reaction noted Other reaction(s): Other (See Comments) Stomach cramps Eye drops made eyes swell CIPROFLOXIN-no reaction noted GUAIFENESIN PSEUDOEPHEDRIN- no reaction noted  . Polymyxin B-Trimethoprim Swelling    Eye drops made eyes swell  . Pseudoephedrine Anaphylaxis and Hives    Stomach cramps Stomach cramps  . Codeine Hives, Itching and Rash  . Guaiacol     Other reaction(s): Other (See Comments) Hallucinations  . Polymyxin B Other (See Comments) and Swelling    Eye swelling Other reaction(s): Other (See Comments) Eye swelling  Eye swelling   Other reaction(s): Other (See Comments) Eye swelling   . Statins Other (See Comments)    Muscle cramps   . Atorvastatin Other (See Comments)    Muscle aches and cramps Other reaction(s): Other (See Comments) Muscle Aches  . Meloxicam Other (See Comments)    unknown Other reaction(s): Other (See Comments) Unknown reaction  . Pseudoephedrine-Guaifenesin Nausea And Vomiting    Stomach cramps  . Rosuvastatin Calcium Other (See Comments)    Muscle aches Other reaction(s): Other (See Comments) Muscle aches  . Simvastatin Other (See Comments)    Muscle aches and cramps Other reaction(s): Other (See Comments) Muscle aches and cramps  . Tapentadol Other (See Comments)    unknown  Other reaction(s): Other (See Comments) Unknown reaction  . Ciprofloxacin Hives, Itching, Nausea Only and Rash  . Moxifloxacin Nausea Only and Other (See Comments)    Stomach cramps Other reaction(s): GI Intolerance, Other (See Comments) Headaches Stomach cramps headaches   . Rofecoxib     Stomach cramping  Other reaction(s): Other (See Comments) Stomach cramping    Social  History:   Social History   Socioeconomic History  . Marital status: Married    Spouse name: Not on file  . Number of children: 0  . Years of education: Not on file  . Highest education level: Not on file  Occupational History  . Occupation: retired    Fish farm manager: RETIRED  Tobacco Use  . Smoking status: Former Smoker    Packs/day: 0.50    Years: 50.00    Pack years: 25.00    Types: Cigarettes    Quit date: 05/01/2013    Years since quitting: 6.9  . Smokeless tobacco: Former Systems developer  . Tobacco comment: using e-Cig//ldc  Vaping Use  . Vaping Use: Never used  Substance and Sexual Activity  . Alcohol use: No  . Drug use: No  . Sexual activity: Yes    Comment: lives with wife, no dietary restrictions  Other Topics Concern  . Not on file  Social History Narrative  . Not on file   Social Determinants of Health  Financial Resource Strain:   . Difficulty of Paying Living Expenses:   Food Insecurity:   . Worried About Charity fundraiser in the Last Year:   . Arboriculturist in the Last Year:   Transportation Needs:   . Film/video editor (Medical):   Marland Kitchen Lack of Transportation (Non-Medical):   Physical Activity:   . Days of Exercise per Week:   . Minutes of Exercise per Session:   Stress:   . Feeling of Stress :   Social Connections:   . Frequency of Communication with Friends and Family:   . Frequency of Social Gatherings with Friends and Family:   . Attends Religious Services:   . Active Member of Clubs or Organizations:   . Attends Archivist Meetings:   Marland Kitchen Marital Status:   Intimate Partner Violence:   . Fear of Current or Ex-Partner:   . Emotionally Abused:   Marland Kitchen Physically Abused:   . Sexually Abused:     Family History:   Family History  Problem Relation Age of Onset  . Heart disease Mother   . Diabetes Mother   . Cirrhosis Mother   . Emphysema Mother        never smoked but 2nd hand through her spouse  . Hypertension Mother   . Macular  degeneration Mother   . Heart disease Father   . Cancer Father        prostate  . Hyperlipidemia Father   . Hypertension Father   . Varicose Veins Father   . Heart attack Father   . Peripheral vascular disease Father   . Heart disease Sister   . Arthritis Sister   . Hyperlipidemia Sister   . Obesity Sister   . Macular degeneration Sister   . Heart disease Brother        5 stents  . Hyperlipidemia Brother   . Macular degeneration Maternal Grandfather   . Cirrhosis Sister   . Obesity Sister   . Arthritis Sister   . Heart disease Sister   . Obesity Sister   . Liver disease Other   . Prostate cancer Other   . Coronary artery disease Other      ROS:  Please see the history of present illness.  All other ROS reviewed and negative.     Physical Exam/Data:   Vitals:   04/09/20 0903 04/09/20 1216 04/09/20 1250  BP: (!) 150/69 111/63 (!) 110/58  Pulse: 96 69 63  Resp: 20 16 (!) 22  Temp: 98.8 F (37.1 C)    TempSrc: Oral    SpO2: 95% 95% 96%   No intake or output data in the 24 hours ending 04/09/20 1343 Last 3 Weights 03/11/2020 09/14/2019 06/30/2019  Weight (lbs) 181 lb 184 lb 12.8 oz 176 lb 3.2 oz  Weight (kg) 82.101 kg 83.825 kg 79.924 kg     There is no height or weight on file to calculate BMI.  General:  Well nourished, well developed, in no acute distress HEENT: normal Lymph: no adenopathy Neck: no JVD Endocrine:  No thryomegaly Vascular: No carotid bruits; FA pulses 2+ bilaterally without bruits  Cardiac:  normal S1, S2; RRR; no murmur  Lungs:  clear to auscultation bilaterally, no wheezing, rhonchi or rales  Abd: soft, diffuse tender to palpation, no hepatomegaly  Ext: no edema Musculoskeletal:  No deformities, BUE and BLE strength normal and equal Skin: warm and dry  Neuro:  CNs 2-12 intact, no focal abnormalities noted Psych:  Normal affect   EKG:  The EKG was personally reviewed and demonstrates: Sinus rhythm with nonspecific T wave changes inferior  laterally Telemetry:  Telemetry was personally reviewed and demonstrates: Sinus rhythm with brief episode of tachycardia initially  Relevant CV Studies:  Coronary angiography: 04/2011 Left main coronary artery:  This showed 50% ostial lesion. Left anterior descending artery:  The vessel is calcified with diffuse 60-70% disease in the mid to distal segment, but the vessel is very small in size, less than 2 mm and serves very small territory.  This is unchanged from recent cardiac catheterization. Right coronary artery:  The vessel is very large and dominant.  There is a 50-60% tubular proximal stenosis.  The rest of the coronary anatomy is unchanged from recent cardiac catheterization.  Left main pressure wire interrogation:  This showed an FFR ratio of 0.85.  Left main IVUS interrogation:  This showed a minimal luminal area of 7 mm sq. which is above the cutoff of 6 mm sq. for revascularization.  Right coronary artery FFR interrogation:  This showed a ratio of 0.89 which is above the cutoff of 0.80.  STUDY CONCLUSION: 1. Moderate left main and right coronary artery stenosis, which is not     significant by FFR and IVUS interrogation. 2. Recommend medical therapy. 3. The patient had reproducible chest pain during adenosine infusion     similar to his symptoms that has been having.  He had significant     anxiety and palpitations although his heart rate was only 65 beats     per minute.  I suspect that there might be a component of anxiety     to his symptoms as well.  ETT 06/2017  Blood pressure demonstrated a normal response to exercise. Modified Bruce protocol.  There was no ST segment deviation noted during stress. Propafenone  There was no QRS widening during stress. No adverse arrhythmias, no ventricular tachycardia, no atrial tachycardia, no atrial fibrillation  There is no electrocardiographic evidence of ischemia although patient had a blunted heart rate response  during exercise. Nondiagnostic for ischemia   Echo 03/2018 Study Conclusions   - Left ventricle: The cavity size was normal. Wall thickness was  increased in a pattern of moderate LVH. Systolic function was  normal. The estimated ejection fraction was in the range of 55%  to 60%. Wall motion was normal; there were no regional wall  motion abnormalities.  - Left atrium: The atrium was severely dilated.  - Right atrium: The atrium was moderately to severely dilated.   Laboratory Data:  High Sensitivity Troponin:   Recent Labs  Lab 04/09/20 1039  TROPONINIHS 241*     Chemistry Recent Labs  Lab 04/09/20 0950  NA 136  K 2.9*  CL 100  CO2 22  GLUCOSE 96  BUN 10  CREATININE 1.02  CALCIUM 7.1*  GFRNONAA >60  GFRAA >60  ANIONGAP 14    Recent Labs  Lab 04/09/20 0950  PROT 6.3*  ALBUMIN 3.2*  AST 23  ALT 16  ALKPHOS 50  BILITOT 1.4*   Hematology Recent Labs  Lab 04/09/20 0950  WBC 9.4  RBC 4.07*  HGB 12.3*  HCT 38.1*  MCV 93.6  MCH 30.2  MCHC 32.3  RDW 12.6  PLT 237   BNPNo results for input(s): BNP, PROBNP in the last 168 hours.  DDimer No results for input(s): DDIMER in the last 168 hours.   Radiology/Studies:  CT ABDOMEN PELVIS W CONTRAST  Result Date: 04/09/2020 CLINICAL  DATA:  Severe lower abdominal pain since last Friday with nausea, vomiting and diarrhea, suspected abdominal abscess/infection; history cholecystectomy, appendectomy, former smoker, hypertension, kidney stones, COPD, GERD, coronary artery disease, atrial fibrillation, alcohol abuse EXAM: CT ABDOMEN AND PELVIS WITH CONTRAST TECHNIQUE: Multidetector CT imaging of the abdomen and pelvis was performed using the standard protocol following bolus administration of intravenous contrast. Sagittal and coronal MPR images reconstructed from axial data set. Exam utilized mA and/or kV adjustment based on patient size in order to minimize patient radiation dose. CONTRAST:  16mL OMNIPAQUE IOHEXOL  300 MG/ML SOLN IV. No oral contrast. COMPARISON:  03/24/2018 FINDINGS: Lower chest: Emphysematous changes with chronic interstitial disease at the posterior lung bases. Hepatobiliary: Gallbladder surgically absent. No focal hepatic abnormalities Pancreas: Tiny calcification at pancreatic body. Pancreas otherwise normal appearance Spleen: Normal appearance Adrenals/Urinary Tract: Adrenal glands, kidneys, and ureters normal appearance. Foley catheter decompresses urinary bladder. Bladder wall appears thickened, may be related to artifact from underdistention but other causes of wall thickening not excluded. Stomach/Bowel: Appendix surgically absent by history. Sigmoid diverticulosis without evidence of diverticulitis. Large and small bowel loops otherwise unremarkable. Marked diffuse gastric wall thickening, may reflect gastritis but infiltrative processes and tumor not excluded. Vascular/Lymphatic: Atherosclerotic calcifications aorta and iliac arteries as well as femoral in coronary arteries. Aorta normal caliber. No adenopathy. Reproductive: Mild prostatic enlargement gland measuring 4.4 x 3.8 cm image 76 Other: Small LEFT inguinal hernia containing fat. Tiny umbilical hernia containing fat. No free air or free fluid. Musculoskeletal: Osseous demineralization. IMPRESSION: Sigmoid diverticulosis without evidence of diverticulitis. Mild prostatic enlargement. Small LEFT inguinal and tiny umbilical hernias containing fat. Marked diffuse gastric wall thickening question gastritis though infiltrative processes and tumor are not excluded; follow-up endoscopy recommended to exclude tumor. 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 Aortic Atherosclerosis (ICD10-I70.0) and Emphysema (ICD10-J43.9). Electronically Signed   By: Lavonia Dana M.D.   On: 04/09/2020 13:03   DG Chest Portable 1 View  Result Date: 04/09/2020 CLINICAL DATA:  Chest pain EXAM: PORTABLE CHEST 1 VIEW COMPARISON:  March 22, 2018 FINDINGS: There is somewhat ill-defined airspace opacity in the right base region. Lungs elsewhere are clear. Heart is upper normal in size with pulmonary vascularity normal. No adenopathy. Postoperative change noted in the lower cervical region. IMPRESSION: Ill-defined opacity consistent with pneumonia right base. This area is somewhat less well-defined than on previous study, but there appears to be overall slight increase in opacity in this area compared to the previous study. Lungs elsewhere clear. Stable cardiac silhouette. No adenopathy. Followup PA and lateral chest radiographs recommended in 3-4 weeks following trial of antibiotic therapy to ensure resolution and exclude underlying malignancy. Electronically Signed   By: Lowella Grip III M.D.   On: 04/09/2020 12:11    HEAR Score (for undifferentiated chest pain):  HEAR Score: 5     Assessment and Plan:   1. Chest pain/elevated troponin in setting of moderate CAD by cardiac cath in 2012 (see report above) -his dull achy epigastric/lower sternal chest pressure this morning in emergency room started after worse episode of dry heaves.  He was given sublingual nitroglycerin x1 with improved symptoms.  He still has acute abdomen on palpation.  High-sensitivity troponin 241.  Pending repeat troponin. -Patient was having exertional symptoms when seen by Ryan [name] last year.  This has been resolved.  His EKG looks much better than last year.  If second troponin comes back flat,no further ischemic work-up planned.  His current symptoms likely due to  acute issue.  2.  Gastritis 3.  Possible pneumonia 4.  COPD -Per primary team  5.  Paroxysmal atrial fibrillation -Maintaining sinus rhythm -Continue Eliquis for anticoagulation -Continue home dose of metoprolol tartrate and propafenone  6. Hypokalemia - Supplemented   He has follow up with Ryan [name] 07/02/20.    For questions or updates, please contact [place] Please consult  [email or web address] for contact info under    Jarrett Soho, PA  04/09/2020 1:43 PM

## 2020-04-09 NOTE — ED Triage Notes (Signed)
Pt bib ems, from home. NVD ex days. Weakness for 2x days. Indwelling urinary catheter placed yesterday.   150/70 70hr 101cbg 98%ra 98*f

## 2020-04-10 DIAGNOSIS — R338 Other retention of urine: Secondary | ICD-10-CM | POA: Diagnosis not present

## 2020-04-10 DIAGNOSIS — R197 Diarrhea, unspecified: Secondary | ICD-10-CM | POA: Diagnosis not present

## 2020-04-10 DIAGNOSIS — K2970 Gastritis, unspecified, without bleeding: Secondary | ICD-10-CM | POA: Diagnosis present

## 2020-04-10 DIAGNOSIS — N179 Acute kidney failure, unspecified: Secondary | ICD-10-CM | POA: Diagnosis not present

## 2020-04-10 DIAGNOSIS — R0789 Other chest pain: Secondary | ICD-10-CM | POA: Diagnosis not present

## 2020-04-10 DIAGNOSIS — J849 Interstitial pulmonary disease, unspecified: Secondary | ICD-10-CM | POA: Diagnosis not present

## 2020-04-10 DIAGNOSIS — J90 Pleural effusion, not elsewhere classified: Secondary | ICD-10-CM | POA: Diagnosis not present

## 2020-04-10 DIAGNOSIS — K2900 Acute gastritis without bleeding: Secondary | ICD-10-CM

## 2020-04-10 DIAGNOSIS — I480 Paroxysmal atrial fibrillation: Secondary | ICD-10-CM

## 2020-04-10 DIAGNOSIS — J439 Emphysema, unspecified: Secondary | ICD-10-CM | POA: Diagnosis present

## 2020-04-10 DIAGNOSIS — R0603 Acute respiratory distress: Secondary | ICD-10-CM | POA: Diagnosis not present

## 2020-04-10 DIAGNOSIS — J438 Other emphysema: Secondary | ICD-10-CM

## 2020-04-10 DIAGNOSIS — I2510 Atherosclerotic heart disease of native coronary artery without angina pectoris: Secondary | ICD-10-CM | POA: Diagnosis present

## 2020-04-10 DIAGNOSIS — K529 Noninfective gastroenteritis and colitis, unspecified: Secondary | ICD-10-CM | POA: Diagnosis present

## 2020-04-10 DIAGNOSIS — J984 Other disorders of lung: Secondary | ICD-10-CM | POA: Diagnosis not present

## 2020-04-10 DIAGNOSIS — I420 Dilated cardiomyopathy: Secondary | ICD-10-CM | POA: Diagnosis present

## 2020-04-10 DIAGNOSIS — Z87442 Personal history of urinary calculi: Secondary | ICD-10-CM | POA: Diagnosis not present

## 2020-04-10 DIAGNOSIS — K5730 Diverticulosis of large intestine without perforation or abscess without bleeding: Secondary | ICD-10-CM | POA: Diagnosis present

## 2020-04-10 DIAGNOSIS — R112 Nausea with vomiting, unspecified: Secondary | ICD-10-CM | POA: Diagnosis not present

## 2020-04-10 DIAGNOSIS — N401 Enlarged prostate with lower urinary tract symptoms: Secondary | ICD-10-CM | POA: Diagnosis present

## 2020-04-10 DIAGNOSIS — Z7901 Long term (current) use of anticoagulants: Secondary | ICD-10-CM | POA: Diagnosis not present

## 2020-04-10 DIAGNOSIS — I110 Hypertensive heart disease with heart failure: Secondary | ICD-10-CM | POA: Diagnosis present

## 2020-04-10 DIAGNOSIS — J690 Pneumonitis due to inhalation of food and vomit: Secondary | ICD-10-CM | POA: Diagnosis not present

## 2020-04-10 DIAGNOSIS — Z20822 Contact with and (suspected) exposure to covid-19: Secondary | ICD-10-CM | POA: Diagnosis not present

## 2020-04-10 DIAGNOSIS — F419 Anxiety disorder, unspecified: Secondary | ICD-10-CM | POA: Diagnosis not present

## 2020-04-10 DIAGNOSIS — E785 Hyperlipidemia, unspecified: Secondary | ICD-10-CM | POA: Diagnosis present

## 2020-04-10 DIAGNOSIS — F1010 Alcohol abuse, uncomplicated: Secondary | ICD-10-CM | POA: Diagnosis present

## 2020-04-10 DIAGNOSIS — E876 Hypokalemia: Secondary | ICD-10-CM | POA: Diagnosis present

## 2020-04-10 DIAGNOSIS — R778 Other specified abnormalities of plasma proteins: Secondary | ICD-10-CM | POA: Diagnosis not present

## 2020-04-10 LAB — CBC
HCT: 31.4 % — ABNORMAL LOW (ref 39.0–52.0)
Hemoglobin: 10.7 g/dL — ABNORMAL LOW (ref 13.0–17.0)
MCH: 30.3 pg (ref 26.0–34.0)
MCHC: 34.1 g/dL (ref 30.0–36.0)
MCV: 89 fL (ref 80.0–100.0)
Platelets: 218 10*3/uL (ref 150–400)
RBC: 3.53 MIL/uL — ABNORMAL LOW (ref 4.22–5.81)
RDW: 12.7 % (ref 11.5–15.5)
WBC: 7.7 10*3/uL (ref 4.0–10.5)
nRBC: 0 % (ref 0.0–0.2)

## 2020-04-10 LAB — BASIC METABOLIC PANEL
Anion gap: 10 (ref 5–15)
BUN: 8 mg/dL (ref 8–23)
CO2: 19 mmol/L — ABNORMAL LOW (ref 22–32)
Calcium: 6.4 mg/dL — CL (ref 8.9–10.3)
Chloride: 106 mmol/L (ref 98–111)
Creatinine, Ser: 0.99 mg/dL (ref 0.61–1.24)
GFR calc Af Amer: >60 mL/min (ref >60)
GFR calc non Af Amer: >60 mL/min (ref >60)
Glucose, Bld: 95 mg/dL (ref 70–99)
Potassium: 3.3 mmol/L — ABNORMAL LOW (ref 3.5–5.1)
Sodium: 135 mmol/L (ref 135–145)

## 2020-04-10 LAB — COMPREHENSIVE METABOLIC PANEL
ALT: 13 U/L (ref 0–44)
AST: 21 U/L (ref 15–41)
Albumin: 2.7 g/dL — ABNORMAL LOW (ref 3.5–5.0)
Alkaline Phosphatase: 44 U/L (ref 38–126)
Anion gap: 11 (ref 5–15)
BUN: 5 mg/dL — ABNORMAL LOW (ref 8–23)
CO2: 21 mmol/L — ABNORMAL LOW (ref 22–32)
Calcium: 6.9 mg/dL — ABNORMAL LOW (ref 8.9–10.3)
Chloride: 106 mmol/L (ref 98–111)
Creatinine, Ser: 0.98 mg/dL (ref 0.61–1.24)
GFR calc Af Amer: >60 mL/min (ref >60)
GFR calc non Af Amer: >60 mL/min (ref >60)
Glucose, Bld: 107 mg/dL — ABNORMAL HIGH (ref 70–99)
Potassium: 4.2 mmol/L (ref 3.5–5.1)
Sodium: 138 mmol/L (ref 135–145)
Total Bilirubin: 0.8 mg/dL (ref 0.3–1.2)
Total Protein: 5.8 g/dL — ABNORMAL LOW (ref 6.5–8.1)

## 2020-04-10 LAB — LACTIC ACID, PLASMA
Lactic Acid, Venous: 1.7 mmol/L (ref 0.5–1.9)
Lactic Acid, Venous: 1 mmol/L (ref 0.5–1.9)

## 2020-04-10 LAB — RAPID URINE DRUG SCREEN, HOSP PERFORMED
Amphetamines: NOT DETECTED
Barbiturates: NOT DETECTED
Benzodiazepines: NOT DETECTED
Cocaine: NOT DETECTED
Opiates: NOT DETECTED
Tetrahydrocannabinol: NOT DETECTED

## 2020-04-10 LAB — URINE CULTURE: Culture: NO GROWTH

## 2020-04-10 LAB — MAGNESIUM
Magnesium: 0.9 mg/dL — CL (ref 1.7–2.4)
Magnesium: 1.5 mg/dL — ABNORMAL LOW (ref 1.7–2.4)

## 2020-04-10 LAB — PROCALCITONIN: Procalcitonin: <0.1 ng/mL

## 2020-04-10 LAB — PHOSPHORUS: Phosphorus: 1.9 mg/dL — ABNORMAL LOW (ref 2.5–4.6)

## 2020-04-10 MED ORDER — MAGNESIUM SULFATE 50 % IJ SOLN
3.0000 g | Freq: Once | INTRAVENOUS | Status: AC
  Administered 2020-04-10: 3 g via INTRAVENOUS
  Filled 2020-04-10: qty 6

## 2020-04-10 MED ORDER — ALBUTEROL SULFATE (2.5 MG/3ML) 0.083% IN NEBU: 3.0000 mL | INHALATION_SOLUTION | Freq: Four times a day (QID) | RESPIRATORY_TRACT | Status: DC | PRN

## 2020-04-10 MED ORDER — POTASSIUM CHLORIDE CRYS ER 20 MEQ PO TBCR
40.0000 meq | EXTENDED_RELEASE_TABLET | ORAL | Status: AC
  Administered 2020-04-10: 40 meq via ORAL
  Filled 2020-04-10: qty 2

## 2020-04-10 MED ORDER — CALCIUM GLUCONATE-NACL 1-0.675 GM/50ML-% IV SOLN
1.0000 g | Freq: Once | INTRAVENOUS | Status: AC
  Administered 2020-04-10: 1000 mg via INTRAVENOUS
  Filled 2020-04-10: qty 50

## 2020-04-10 MED ORDER — MAGNESIUM SULFATE 2 GM/50ML IV SOLN
2.0000 g | Freq: Once | INTRAVENOUS | Status: AC
  Administered 2020-04-10: 2 g via INTRAVENOUS
  Filled 2020-04-10: qty 50

## 2020-04-10 MED ORDER — SODIUM CHLORIDE 0.9 % IV SOLN
3.0000 g | Freq: Once | INTRAVENOUS | Status: AC
  Administered 2020-04-10: 3 g via INTRAVENOUS
  Filled 2020-04-10: qty 30

## 2020-04-10 NOTE — Progress Notes (Signed)
PROGRESS NOTE    Ryan [name]  QJJ:[id number] DOB: [birth year] DOA: 04/09/2020 PCP: [name], MD     Brief Narrative:  74 y.o.WM PMHx HTN, HLD, dilated cardiomyopathy, atrial fibrillation/atrial flutter on Eliquis,anemia, and nephrolithiasis.  Does not use O2 at home.   Presents with complaints of nausea, vomiting, and diarrhea over the last 4 days.  Patient reports that he had eaten his normal peanut butter and salad dressing sandwiches as he is always done previously without issue.  However started having diarrhea and subsequently developed nausea and vomiting.  He was seen in the emergency department 2 days ago for urinary retention and a Foley catheter was placed.  He was advised to follow-up with urology but had not been able to set up appointment as of yet.  Denies any blood in his emesis, but states that his diarrhea is a reddish-yellow color now.  Associated symptoms included lower abdominal pain.  Today he had gotten so weak that he was unable to get up and therefore came into the hospital.  While in the hospital patient did complain of chest tightness  ED Course: Upon admission into the emergency department patient was noted to be afebrile with patient 16-22 and all other vital signs maintained. Labs significant for hemoglobin 12.3, potassium 2.9 , and troponin 241. Patient had been given 324 mg of aspirin and nitroglycerin with resolution of pain symptoms. Due to chest x-ray findings patient had been given azithromycin and  Rocephin.   Subjective: Afebrile overnight, A/O x4, negative N/V/D.  States still feels a little queasy but significantly improved.  Would like to try to advance his diet.  Was able to urinate post catheter removal   Assessment & Plan: Covid vaccination; positive vaccination   Principal Problem:   Nausea, vomiting, and diarrhea Active Problems:   Essential hypertension   Atypical chest pain   CAD (coronary atherosclerotic disease)   A-fib (HCC)    Elevated troponin   Hypomagnesemia   Hypokalemia   Acute urinary retention   Gastritis   Dilated cardiomyopathy (HCC)   Emphysema lung (HCC)  Nausea, vomiting, diarrhea/gastritis -7/6 CT scan abdomen consistent with gastritis see results below -Patient feels somewhat improved but still queasy -7/7 advance diet to [employer].  Counseled patient and wife on what this consists of, no matter what they send up -Mild hydration normal saline 59ml/hr  Dilated cardiomyopathy -On last echocardiogram 03/23/2018 EF preserved but severely dilated LEFT/RIGHT atrium -Strict in and out -Daily weight -Lasix 20 mg daily -Metoprolol 25 mg BID -NTG 0.4 mg PRN -Cardiology recommends no ischemic further work-up  Atypical chest pain/elevated troponin -Most consistent with demand ischemia secondary to patient's above illness -Trend troponin Results for Ryan [name], Ryan [name] "L" (MRN [id number]) as of 04/10/2020 18:30  Ref. Range 04/09/2020 10:39 04/09/2020 14:08  Troponin I (High Sensitivity) Latest Ref Range: <18 ng/L 241 (HH) 187 (HH)   Paroxysmal atrial fibrillation -See dilated cardiomyopathy  Emphysema -Not on home O2 -Titrate O2 to maintain SPO2> 92% -Albuterol QID PRN  Aspiration pneumonia -Continue current course of antibiotics -Procalcitonin/lactic acid pending, will de-escalate depending upon findings given patient afebrile, negative leukocytosis  Hypokalemia -Potassium goal> 4 -At goal  Hypomagnesmia (acute) -Magnesium goal> 2 -Magnesium IV 3 g  Hypocalcemia -Calcium gluconate 3 g  Drug abuse? -7/7 UDS negative -EtOH negative  BPH/acute urinary retention -Recently came to the emergency department and required placement of Foley catheter for urinary retention.  He was advised to follow-up with urology in the outpatient  setting but has not done so yet.  -Voiding trial successful. -Bladder scan q shift -Flomax 0.4 mg daily      DVT prophylaxis: Apixaban Code Status:  Full Family Communication: 7/7 wife at bedside for discussion of plan of care Status is: Inpatient    Dispo: The patient is from: Home              Anticipated d/c is to:               Anticipated d/c date is: 7/9              Patient currently unstable currently electrolytes significantly outside WNL, not able to consume food yet.      Consultants:  Cardiology Dr. Peter Martinique  Procedures/Significant Events:  7/6 CT abdomen pelvis W contrast;Sigmoid diverticulosis without evidence of diverticulitis.  Mild prostatic enlargement.  Small LEFT inguinal and tiny umbilical hernias containing fat.  Marked diffuse gastric wall thickening question gastritis though infiltrative processes and tumor are not excluded; follow-up endoscopy recommended to exclude tumor.  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  I have personally reviewed and interpreted all radiology studies and my findings are as above.  VENTILATOR SETTINGS:    Cultures 7/6 SARS coronavirus negative   Antimicrobials: Anti-infectives (From admission, onward)   Start     Ordered Stop   04/10/20 1000  cefTRIAXone (ROCEPHIN) 1 g in sodium chloride 0.9 % 100 mL IVPB     Discontinue     04/09/20 1836     04/10/20 1000  azithromycin (ZITHROMAX) 500 mg in sodium chloride 0.9 % 250 mL IVPB     Discontinue     04/09/20 1836     04/09/20 1300  azithromycin (ZITHROMAX) 500 mg in sodium chloride 0.9 % 250 mL IVPB        04/09/20 1259 04/09/20 1540   04/09/20 0915  cefTRIAXone (ROCEPHIN) 2 g in sodium chloride 0.9 % 100 mL IVPB  Status:  Discontinued        04/09/20 0912 04/09/20 0913   04/09/20 0915  cefTRIAXone (ROCEPHIN) 1 g in sodium chloride 0.9 % 100 mL IVPB        04/09/20 0913 04/09/20 1041       Devices    LINES / TUBES:      Continuous Infusions: . azithromycin (ZITHROMAX) 500 MG IVPB (Vial-Mate Adaptor) Stopped (04/10/20 1230)  . cefTRIAXone (ROCEPHIN)  IV  Stopped (04/10/20 1100)     Objective: Vitals:   04/10/20 0758 04/10/20 0800 04/10/20 1103 04/10/20 1550  BP:  (!) 128/57 (!) 106/59 (!) 122/53  Pulse:  64 60   Resp: (!) 27 20  20   Temp: 99.1 F (37.3 C)  98.6 F (37 C) 98.8 F (37.1 C)  TempSrc: Oral  Oral Oral  SpO2:  96%    Weight:      Height:        Intake/Output Summary (Last 24 hours) at 04/10/2020 1853 Last data filed at 04/10/2020 1840 Gross per 24 hour  Intake 936 ml  Output 800 ml  Net 136 ml   Filed Weights   04/09/20 2116  Weight: 78.5 kg    Examination:  General: A/O x4, no acute respiratory distress Eyes: negative scleral hemorrhage, negative anisocoria, negative icterus ENT: Negative Runny nose, negative gingival bleeding, Neck:  Negative scars, masses, torticollis, lymphadenopathy, JVD Lungs: Clear to auscultation bilaterally without wheezes or crackles Cardiovascular: Regular rate and rhythm without murmur gallop or rub normal S1  and S2 Abdomen: Positive mild abdominal pain LLQ>>> LUQ, nondistended, positive Hypoactive bowel sounds, no rebound, no ascites, no appreciable mass Extremities: No significant cyanosis, clubbing, or edema bilateral lower extremities Skin: Negative rashes, lesions, ulcers Psychiatric:  Negative depression, negative anxiety, negative fatigue, negative mania  Central nervous system:  Cranial nerves II through XII intact, tongue/uvula midline, all extremities muscle strength 5/5, sensation intact throughout, negative dysarthria, negative expressive aphasia, negative receptive aphasia.  .     Data Reviewed: Care during the described time interval was provided by me .  I have reviewed this patient's available data, including medical history, events of note, physical examination, and all test results as part of my evaluation.  CBC: Recent Labs  Lab 04/09/20 0950 04/10/20 0315  WBC 9.4 7.7  NEUTROABS 6.8  --   HGB 12.3* 10.7*  HCT 38.1* 31.4*  MCV 93.6 89.0  PLT 237 024    Basic Metabolic Panel: Recent Labs  Lab 04/09/20 0950 04/09/20 1408 04/10/20 0315 04/10/20 0930  NA 136  --  135 138  K 2.9*  --  3.3* 4.2  CL 100  --  106 106  CO2 22  --  19* 21*  GLUCOSE 96  --  95 107*  BUN 10  --  8 5*  CREATININE 1.02  --  0.99 0.98  CALCIUM 7.1*  --  6.4* 6.9*  MG  --  0.4* 0.9* 1.5*  PHOS  --   --   --  1.9*   GFR: Estimated Creatinine Clearance: 63.5 mL/min (by C-G formula based on SCr of 0.98 mg/dL). Liver Function Tests: Recent Labs  Lab 04/09/20 0950 04/10/20 0930  AST 23 21  ALT 16 13  ALKPHOS 50 44  BILITOT 1.4* 0.8  PROT 6.3* 5.8*  ALBUMIN 3.2* 2.7*   Recent Labs  Lab 04/09/20 0950  LIPASE 29   No results for input(s): AMMONIA in the last 168 hours. Coagulation Profile: Recent Labs  Lab 04/09/20 0950  INR 1.3*   Cardiac Enzymes: No results for input(s): CKTOTAL, CKMB, CKMBINDEX, TROPONINI in the last 168 hours. BNP (last 3 results) No results for input(s): PROBNP in the last 8760 hours. HbA1C: No results for input(s): HGBA1C in the last 72 hours. CBG: Recent Labs  Lab 04/09/20 0857  GLUCAP 100*   Lipid Profile: Recent Labs    04/09/20 1408  CHOL 208*  HDL 25*  LDLCALC 167*  TRIG 80  CHOLHDL 8.3   Thyroid Function Tests: No results for input(s): TSH, T4TOTAL, FREET4, T3FREE, THYROIDAB in the last 72 hours. Anemia Panel: No results for input(s): VITAMINB12, FOLATE, FERRITIN, TIBC, IRON, RETICCTPCT in the last 72 hours. Sepsis Labs: Recent Labs  Lab 04/09/20 0950  LATICACIDVEN 1.4    Recent Results (from the past 240 hour(s))  Urine culture     Status: None   Collection Time: 04/08/20  5:41 AM   Specimen: Urine, Clean Catch  Result Value Ref Range Status   Specimen Description   Final    URINE, CLEAN CATCH Performed at [facility], [place] [address], [place], [place] [address]    Special Requests   Final    NONE Performed at [facility], [place] [address],  [place], [place] [address]    Culture   Final    NO GROWTH Performed at [facility], [place] [address] [place],  [address]    Report Status 04/09/2020 FINAL  Final  Blood culture (routine x 2)  Status: None (Preliminary result)   Collection Time: 04/09/20  9:51 AM   Specimen: BLOOD  Result Value Ref Range Status   Specimen Description BLOOD RIGHT ANTECUBITAL  Final   Special Requests   Final    BOTTLES DRAWN AEROBIC AND ANAEROBIC Blood Culture adequate volume   Culture   Final    NO GROWTH 1 DAY Performed at [facility], [address] [place], [place] [address]    Report Status PENDING  Incomplete  Blood culture (routine x 2)     Status: None (Preliminary result)   Collection Time: 04/09/20  9:51 AM   Specimen: BLOOD  Result Value Ref Range Status   Specimen Description BLOOD LEFT ANTECUBITAL  Final   Special Requests   Final    BOTTLES DRAWN AEROBIC AND ANAEROBIC Blood Culture results may not be optimal due to an inadequate volume of blood received in culture bottles   Culture   Final    NO GROWTH 1 DAY Performed at [facility], [place] [address] [place], [place] [address]    Report Status PENDING  Incomplete  Urine culture     Status: None   Collection Time: 04/09/20 10:25 AM   Specimen: Urine, Random  Result Value Ref Range Status   Specimen Description URINE, RANDOM  Final   Special Requests NONE  Final   Culture   Final    NO GROWTH Performed at [facility], [place] [address], [place], [place] [address]    Report Status 04/10/2020 FINAL  Final  SARS Coronavirus 2 by RT PCR (hospital order, performed in [facility] hospital lab) Nasopharyngeal Nasopharyngeal Swab     Status: None   Collection Time: 04/09/20 12:40 PM   Specimen: Nasopharyngeal Swab  Result Value Ref Range Status   SARS Coronavirus 2 NEGATIVE NEGATIVE Final    Comment: (NOTE) SARS-CoV-2 target nucleic acids are NOT DETECTED.  The SARS-CoV-2 RNA is generally detectable in  upper and lower respiratory specimens during the acute phase of infection. The lowest concentration of SARS-CoV-2 viral copies this assay can detect is 250 copies / mL. A negative result does not preclude SARS-CoV-2 infection and should not be used as the sole basis for treatment or other patient management decisions.  A negative result may occur with improper specimen collection / handling, submission of specimen other than nasopharyngeal swab, presence of viral mutation(s) within the areas targeted by this assay, and inadequate number of viral copies (<250 copies / mL). A negative result must be combined with clinical observations, patient history, and epidemiological information.  Fact Sheet for Patients:   StrictlyIdeas.no  Fact Sheet for Healthcare Providers: BankingDealers.co.za  This test is not yet approved or  cleared by the [place] FDA and has been authorized for detection and/or diagnosis of SARS-CoV-2 by FDA under an Emergency Use Authorization (EUA).  This EUA will remain in effect (meaning this test can be used) for the duration of the COVID-19 declaration under Section 564(b)(1) of the Act, 21 U.S.C. section 360bbb-3(b)(1), unless the authorization is terminated or revoked sooner.  Performed at [facility], [place] [address], [place], [place] [address]   MRSA PCR Screening     Status: None   Collection Time: 04/09/20  9:22 PM   Specimen: Nasal Mucosa; Nasopharyngeal  Result Value Ref Range Status   MRSA by PCR NEGATIVE NEGATIVE Final    Comment:        The GeneXpert MRSA Assay (FDA approved for  NASAL specimens only), is one component of a comprehensive MRSA colonization surveillance program. It is not intended to diagnose MRSA infection nor to guide or monitor treatment for MRSA infections. Performed at [facility], [place] [address] [place], [place] [address]          Radiology  Studies: CT ABDOMEN PELVIS W CONTRAST  Result Date: 04/09/2020 CLINICAL DATA:  Severe lower abdominal pain since last Friday with nausea, vomiting and diarrhea, suspected abdominal abscess/infection; history cholecystectomy, appendectomy, former smoker, hypertension, kidney stones, COPD, GERD, coronary artery disease, atrial fibrillation, alcohol abuse EXAM: CT ABDOMEN AND PELVIS WITH CONTRAST TECHNIQUE: Multidetector CT imaging of the abdomen and pelvis was performed using the standard protocol following bolus administration of intravenous contrast. Sagittal and coronal MPR images reconstructed from axial data set. Exam utilized mA and/or kV adjustment based on patient size in order to minimize patient radiation dose. CONTRAST:  150mL OMNIPAQUE IOHEXOL 300 MG/ML SOLN IV. No oral contrast. COMPARISON:  03/24/2018 FINDINGS: Lower chest: Emphysematous changes with chronic interstitial disease at the posterior lung bases. Hepatobiliary: Gallbladder surgically absent. No focal hepatic abnormalities Pancreas: Tiny calcification at pancreatic body. Pancreas otherwise normal appearance Spleen: Normal appearance Adrenals/Urinary Tract: Adrenal glands, kidneys, and ureters normal appearance. Foley catheter decompresses urinary bladder. Bladder wall appears thickened, may be related to artifact from underdistention but other causes of wall thickening not excluded. Stomach/Bowel: Appendix surgically absent by history. Sigmoid diverticulosis without evidence of diverticulitis. Large and small bowel loops otherwise unremarkable. Marked diffuse gastric wall thickening, may reflect gastritis but infiltrative processes and tumor not excluded. Vascular/Lymphatic: Atherosclerotic calcifications aorta and iliac arteries as well as femoral in coronary arteries. Aorta normal caliber. No adenopathy. Reproductive: Mild prostatic enlargement gland measuring 4.4 x 3.8 cm image 76 Other: Small LEFT inguinal hernia containing fat. Tiny  umbilical hernia containing fat. No free air or free fluid. Musculoskeletal: Osseous demineralization. IMPRESSION: Sigmoid diverticulosis without evidence of diverticulitis. Mild prostatic enlargement. Small LEFT inguinal and tiny umbilical hernias containing fat. Marked diffuse gastric wall thickening question gastritis though infiltrative processes and tumor are not excluded; follow-up endoscopy recommended to exclude tumor. 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 Aortic Atherosclerosis (ICD10-I70.0) and Emphysema (ICD10-J43.9). Electronically Signed   By: Lavonia Dana M.D.   On: 04/09/2020 13:03   DG Chest Portable 1 View  Result Date: 04/09/2020 CLINICAL DATA:  Chest pain EXAM: PORTABLE CHEST 1 VIEW COMPARISON:  March 22, 2018 FINDINGS: There is somewhat ill-defined airspace opacity in the right base region. Lungs elsewhere are clear. Heart is upper normal in size with pulmonary vascularity normal. No adenopathy. Postoperative change noted in the lower cervical region. IMPRESSION: Ill-defined opacity consistent with pneumonia right base. This area is somewhat less well-defined than on previous study, but there appears to be overall slight increase in opacity in this area compared to the previous study. Lungs elsewhere clear. Stable cardiac silhouette. No adenopathy. Followup PA and lateral chest radiographs recommended in 3-4 weeks following trial of antibiotic therapy to ensure resolution and exclude underlying malignancy. Electronically Signed   By: Lowella Grip III M.D.   On: 04/09/2020 12:11        Scheduled Meds: . apixaban  5 mg Oral BID  . brimonidine  1 drop Left Eye BID  . Chlorhexidine Gluconate Cloth  6 each Topical Daily  . furosemide  20 mg Oral Daily  . latanoprost  1 drop Left Eye QHS  . metoprolol tartrate  25 mg Oral BID  . pantoprazole (  PROTONIX) IV  40 mg Intravenous Q12H  . propafenone  225 mg Oral BID  . sodium chloride flush  3 mL  Intravenous Q12H  . tamsulosin  0.4 mg Oral Daily   Continuous Infusions: . azithromycin (ZITHROMAX) 500 MG IVPB (Vial-Mate Adaptor) Stopped (04/10/20 1230)  . cefTRIAXone (ROCEPHIN)  IV Stopped (04/10/20 1100)     LOS: 0 days    Time spent:40 min    Kekai Geter, Geraldo Docker, MD [facility] Pager [phone]  If 7PM-7AM, please contact night-coverage [email or web address] Password [email or web address] 04/10/2020, 6:53 PM

## 2020-04-10 NOTE — Progress Notes (Signed)
Progress Note  Patient Name: Ryan [name] Date of Encounter: 04/10/2020  [facility] Cardiologist: Cristopher Peru, MD   Subjective   Feels much better today. Nausea and diarrhea resolved. Still a little sore in epigastric region but much better.   Inpatient Medications    Scheduled Meds: . apixaban  5 mg Oral BID  . brimonidine  1 drop Left Eye BID  . Chlorhexidine Gluconate Cloth  6 each Topical Daily  . furosemide  20 mg Oral Daily  . latanoprost  1 drop Left Eye QHS  . metoprolol tartrate  25 mg Oral BID  . pantoprazole (PROTONIX) IV  40 mg Intravenous Q12H  . propafenone  225 mg Oral BID  . sodium chloride flush  3 mL Intravenous Q12H  . tamsulosin  0.4 mg Oral Daily   Continuous Infusions: . azithromycin (ZITHROMAX) 500 MG IVPB (Vial-Mate [employer])    . cefTRIAXone (ROCEPHIN)  IV     PRN Meds: clobetasol cream, fentaNYL (SUBLIMAZE) injection, nitroGLYCERIN, ondansetron **OR** ondansetron (ZOFRAN) IV   Vital Signs    Vitals:   04/09/20 2116 04/09/20 2327 04/10/20 0425 04/10/20 0500  BP: 133/61 (!) 141/58    Pulse: 74 64 69 65  Resp: 17 (!) 25 (!) 28 18  Temp: 98.8 F (37.1 C) 98.7 F (37.1 C) 98.9 F (37.2 C)   TempSrc: Oral Oral Oral   SpO2: 96% 98% 97% 97%  Weight: 78.5 kg     Height: 5\' 4"  (1.626 m)       Intake/Output Summary (Last 24 hours) at 04/10/2020 0742 Last data filed at 04/09/2020 1815 Gross per 24 hour  Intake --  Output 350 ml  Net -350 ml   Last 3 Weights 04/09/2020 03/11/2020 09/14/2019  Weight (lbs) 173 lb 1 oz 181 lb 184 lb 12.8 oz  Weight (kg) 78.5 kg 82.101 kg 83.825 kg      Telemetry    NSR - Personally Reviewed  ECG    NSR with nonspecific TWA  - Personally Reviewed  Physical Exam   GEN: No acute distress.   Neck: No JVD Cardiac: RRR, no murmurs, rubs, or gallops.  Respiratory: Clear to auscultation bilaterally. GI: Soft, nontender, non-distended  MS: No edema; No deformity. Neuro:  Nonfocal  Psych: Normal affect    Labs    High Sensitivity Troponin:   Recent Labs  Lab 04/09/20 1039 04/09/20 1408  TROPONINIHS 241* 187*      Chemistry Recent Labs  Lab 04/09/20 0950 04/10/20 0315  NA 136 135  K 2.9* 3.3*  CL 100 106  CO2 22 19*  GLUCOSE 96 95  BUN 10 8  CREATININE 1.02 0.99  CALCIUM 7.1* 6.4*  PROT 6.3*  --   ALBUMIN 3.2*  --   AST 23  --   ALT 16  --   ALKPHOS 50  --   BILITOT 1.4*  --   GFRNONAA >60 >60  GFRAA >60 >60  ANIONGAP 14 10     Hematology Recent Labs  Lab 04/09/20 0950 04/10/20 0315  WBC 9.4 7.7  RBC 4.07* 3.53*  HGB 12.3* 10.7*  HCT 38.1* 31.4*  MCV 93.6 89.0  MCH 30.2 30.3  MCHC 32.3 34.1  RDW 12.6 12.7  PLT 237 218    BNPNo results for input(s): BNP, PROBNP in the last 168 hours.   DDimer No results for input(s): DDIMER in the last 168 hours.   Radiology    CT ABDOMEN PELVIS W CONTRAST  Result Date: 04/09/2020  CLINICAL DATA:  Severe lower abdominal pain since last Friday with nausea, vomiting and diarrhea, suspected abdominal abscess/infection; history cholecystectomy, appendectomy, former smoker, hypertension, kidney stones, COPD, GERD, coronary artery disease, atrial fibrillation, alcohol abuse EXAM: CT ABDOMEN AND PELVIS WITH CONTRAST TECHNIQUE: Multidetector CT imaging of the abdomen and pelvis was performed using the standard protocol following bolus administration of intravenous contrast. Sagittal and coronal MPR images reconstructed from axial data set. Exam utilized mA and/or kV adjustment based on patient size in order to minimize patient radiation dose. CONTRAST:  138mL OMNIPAQUE IOHEXOL 300 MG/ML SOLN IV. No oral contrast. COMPARISON:  03/24/2018 FINDINGS: Lower chest: Emphysematous changes with chronic interstitial disease at the posterior lung bases. Hepatobiliary: Gallbladder surgically absent. No focal hepatic abnormalities Pancreas: Tiny calcification at pancreatic body. Pancreas otherwise normal appearance Spleen: Normal appearance  Adrenals/Urinary Tract: Adrenal glands, kidneys, and ureters normal appearance. Foley catheter decompresses urinary bladder. Bladder wall appears thickened, may be related to artifact from underdistention but other causes of wall thickening not excluded. Stomach/Bowel: Appendix surgically absent by history. Sigmoid diverticulosis without evidence of diverticulitis. Large and small bowel loops otherwise unremarkable. Marked diffuse gastric wall thickening, may reflect gastritis but infiltrative processes and tumor not excluded. Vascular/Lymphatic: Atherosclerotic calcifications aorta and iliac arteries as well as femoral in coronary arteries. Aorta normal caliber. No adenopathy. Reproductive: Mild prostatic enlargement gland measuring 4.4 x 3.8 cm image 76 Other: Small LEFT inguinal hernia containing fat. Tiny umbilical hernia containing fat. No free air or free fluid. Musculoskeletal: Osseous demineralization. IMPRESSION: Sigmoid diverticulosis without evidence of diverticulitis. Mild prostatic enlargement. Small LEFT inguinal and tiny umbilical hernias containing fat. Marked diffuse gastric wall thickening question gastritis though infiltrative processes and tumor are not excluded; follow-up endoscopy recommended to exclude tumor. 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 Aortic Atherosclerosis (ICD10-I70.0) and Emphysema (ICD10-J43.9). Electronically Signed   By: Lavonia Dana M.D.   On: 04/09/2020 13:03   DG Chest Portable 1 View  Result Date: 04/09/2020 CLINICAL DATA:  Chest pain EXAM: PORTABLE CHEST 1 VIEW COMPARISON:  March 22, 2018 FINDINGS: There is somewhat ill-defined airspace opacity in the right base region. Lungs elsewhere are clear. Heart is upper normal in size with pulmonary vascularity normal. No adenopathy. Postoperative change noted in the lower cervical region. IMPRESSION: Ill-defined opacity consistent with pneumonia right base. This area is somewhat less  well-defined than on previous study, but there appears to be overall slight increase in opacity in this area compared to the previous study. Lungs elsewhere clear. Stable cardiac silhouette. No adenopathy. Followup PA and lateral chest radiographs recommended in 3-4 weeks following trial of antibiotic therapy to ensure resolution and exclude underlying malignancy. Electronically Signed   By: Lowella Grip III M.D.   On: 04/09/2020 12:11    Cardiac Studies   Coronary angiography: 04/2011 Left main coronary artery: This showed 50% ostial lesion. Left anterior descending artery: The vessel is calcified with diffuse 60-70% disease in the mid to distal segment, but the vessel is very small in size, less than 2 mm and serves very small territory. This is unchanged from recent cardiac catheterization. Right coronary artery: The vessel is very large and dominant. There is a 50-60% tubular proximal stenosis. The rest of the coronary anatomy is unchanged from recent cardiac catheterization.  Left main pressure wire interrogation: This showed an FFR ratio of 0.85.  Left main IVUS interrogation: This showed a minimal luminal area of 7 mm sq. which is above the cutoff of 6 mm sq.  for revascularization.  Right coronary artery FFR interrogation: This showed a ratio of 0.89 which is above the cutoff of 0.80.  STUDY CONCLUSION: 1. Moderate left main and right coronary artery stenosis, which is not significant by FFR and IVUS interrogation. 2. Recommend medical therapy. 3. The patient had reproducible chest pain during adenosine infusion similar to his symptoms that has been having. He had significant anxiety and palpitations although his heart rate was only 65 beats per minute. I suspect that there might be a component of anxiety to his symptoms as well.  ETT 06/2017  Blood pressure demonstrated a normal response to exercise. Modified Bruce protocol.  There  was no ST segment deviation noted during stress. Propafenone  There was no QRS widening during stress. No adverse arrhythmias, no ventricular tachycardia, no atrial tachycardia, no atrial fibrillation  There is no electrocardiographic evidence of ischemia although patient had a blunted heart rate response during exercise. Nondiagnostic for ischemia  Echo 03/2018 Study Conclusions   - Left ventricle: The cavity size was normal. Wall thickness was  increased in a pattern of moderate LVH. Systolic function was  normal. The estimated ejection fraction was in the range of 55%  to 60%. Wall motion was normal; there were no regional wall  motion abnormalities.  - Left atrium: The atrium was severely dilated.  - Right atrium: The atrium was moderately to severely dilated.    Patient Profile     74 y.o. 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 Tamala Julian.   Assessment & Plan    1. Chest pain secondary to protracted N/V and dry heaves. Improved today.    High-sensitivity troponin 241> 187.  most c/w with demand ischemia in setting of gastroenteritis. I don't think this is significant.  -  His EKG looks much better than last year. No  further ischemic work-up planned.  His current symptoms likely due to acute issue.  2.  Gastroenteritis. Improving. 3.  Possible pneumonia 4.  COPD -Per primary team  5.  Paroxysmal atrial fibrillation -Maintaining sinus rhythm -Continue Eliquis for anticoagulation -Continue home dose of metoprolol tartrate and propafenone  6. Hypokalemia - Supplemented   He has follow up with Dr. Lovena Le 07/02/20.  [facility] will sign off.   Medication Recommendations:  Per primary team Other recommendations (labs, testing, etc):  none Follow up as an outpatient:  As noted above.  For questions or  updates, please contact [place] Please consult [email or web address] for contact info under        Signed, Brentin Shin [place], MD  04/10/2020, 7:42 AM

## 2020-04-10 NOTE — Progress Notes (Signed)
Orders currently in to remove foley cath. Pt and ED nurse notified this nurse that MD had told them to wait till AM to remove foley. [facility] hospitalist paged for clarification, verbal orders to wait till AM to remove foley received.

## 2020-04-10 NOTE — Progress Notes (Signed)
CRITICAL VALUE ALERT  Critical Value:  Ca: 6.4, Mg: .9, K: 3.3   Date & Time Notied:  0500  Provider Notified: T. Opyd, MD   Orders Received/Actions taken: IV Mg and Ca replacement, and PO K replacement ordered.

## 2020-04-10 NOTE — Plan of Care (Signed)
  Problem: Education: Goal: Knowledge of General Education information will improve Description: Including pain rating scale, medication(s)/side effects and non-pharmacologic comfort measures Outcome: Progressing   Problem: Health Behavior/Discharge Planning: Goal: Ability to manage health-related needs will improve Outcome: Progressing   Problem: Clinical Measurements: Goal: Ability to maintain clinical measurements within normal limits will improve Outcome: Progressing Goal: Diagnostic test results will improve Outcome: Progressing Goal: Respiratory complications will improve Outcome: Progressing   Problem: Activity: Goal: Risk for activity intolerance will decrease Outcome: Progressing   Problem: Nutrition: Goal: Adequate nutrition will be maintained Outcome: Progressing   Problem: Coping: Goal: Level of anxiety will decrease Outcome: Progressing

## 2020-04-11 ENCOUNTER — Inpatient Hospital Stay (HOSPITAL_COMMUNITY): Payer: Medicare Other

## 2020-04-11 DIAGNOSIS — J849 Interstitial pulmonary disease, unspecified: Secondary | ICD-10-CM

## 2020-04-11 DIAGNOSIS — J810 Acute pulmonary edema: Secondary | ICD-10-CM

## 2020-04-11 LAB — COMPREHENSIVE METABOLIC PANEL
ALT: 12 U/L (ref 0–44)
AST: 17 U/L (ref 15–41)
Albumin: 2.5 g/dL — ABNORMAL LOW (ref 3.5–5.0)
Alkaline Phosphatase: 46 U/L (ref 38–126)
Anion gap: 8 (ref 5–15)
BUN: 7 mg/dL — ABNORMAL LOW (ref 8–23)
CO2: 21 mmol/L — ABNORMAL LOW (ref 22–32)
Calcium: 7.5 mg/dL — ABNORMAL LOW (ref 8.9–10.3)
Chloride: 106 mmol/L (ref 98–111)
Creatinine, Ser: 1.09 mg/dL (ref 0.61–1.24)
GFR calc Af Amer: >60 mL/min (ref >60)
GFR calc non Af Amer: >60 mL/min (ref >60)
Glucose, Bld: 102 mg/dL — ABNORMAL HIGH (ref 70–99)
Potassium: 3.4 mmol/L — ABNORMAL LOW (ref 3.5–5.1)
Sodium: 135 mmol/L (ref 135–145)
Total Bilirubin: 1.3 mg/dL — ABNORMAL HIGH (ref 0.3–1.2)
Total Protein: 5.4 g/dL — ABNORMAL LOW (ref 6.5–8.1)

## 2020-04-11 LAB — CBC WITH DIFFERENTIAL/PLATELET
Abs Immature Granulocytes: 0.07 10*3/uL (ref 0.00–0.07)
Basophils Absolute: 0 10*3/uL (ref 0.0–0.1)
Basophils Relative: 1 %
Eosinophils Absolute: 0.3 10*3/uL (ref 0.0–0.5)
Eosinophils Relative: 4 %
HCT: 29.1 % — ABNORMAL LOW (ref 39.0–52.0)
Hemoglobin: 9.8 g/dL — ABNORMAL LOW (ref 13.0–17.0)
Immature Granulocytes: 1 %
Lymphocytes Relative: 22 %
Lymphs Abs: 1.7 10*3/uL (ref 0.7–4.0)
MCH: 30.2 pg (ref 26.0–34.0)
MCHC: 33.7 g/dL (ref 30.0–36.0)
MCV: 89.8 fL (ref 80.0–100.0)
Monocytes Absolute: 0.8 10*3/uL (ref 0.1–1.0)
Monocytes Relative: 10 %
Neutro Abs: 4.8 10*3/uL (ref 1.7–7.7)
Neutrophils Relative %: 62 %
Platelets: 208 10*3/uL (ref 150–400)
RBC: 3.24 MIL/uL — ABNORMAL LOW (ref 4.22–5.81)
RDW: 12.6 % (ref 11.5–15.5)
WBC: 7.7 10*3/uL (ref 4.0–10.5)
nRBC: 0 % (ref 0.0–0.2)

## 2020-04-11 LAB — BASIC METABOLIC PANEL
Anion gap: 8 (ref 5–15)
BUN: 8 mg/dL (ref 8–23)
CO2: 21 mmol/L — ABNORMAL LOW (ref 22–32)
Calcium: 7.9 mg/dL — ABNORMAL LOW (ref 8.9–10.3)
Chloride: 106 mmol/L (ref 98–111)
Creatinine, Ser: 0.99 mg/dL (ref 0.61–1.24)
GFR calc Af Amer: >60 mL/min (ref >60)
GFR calc non Af Amer: >60 mL/min (ref >60)
Glucose, Bld: 104 mg/dL — ABNORMAL HIGH (ref 70–99)
Potassium: 4.4 mmol/L (ref 3.5–5.1)
Sodium: 135 mmol/L (ref 135–145)

## 2020-04-11 LAB — PHOSPHORUS
Phosphorus: 2.4 mg/dL — ABNORMAL LOW (ref 2.5–4.6)
Phosphorus: 2.3 mg/dL — ABNORMAL LOW (ref 2.5–4.6)

## 2020-04-11 LAB — MAGNESIUM
Magnesium: 1.8 mg/dL (ref 1.7–2.4)
Magnesium: 2.3 mg/dL (ref 1.7–2.4)

## 2020-04-11 LAB — PROCALCITONIN: Procalcitonin: <0.1 ng/mL

## 2020-04-11 LAB — TROPONIN I (HIGH SENSITIVITY): Troponin I (High Sensitivity): 57 ng/L — ABNORMAL HIGH (ref <18)

## 2020-04-11 MED ORDER — CALCIUM GLUCONATE-NACL 2-0.675 GM/100ML-% IV SOLN
2.0000 g | Freq: Once | INTRAVENOUS | Status: AC
  Administered 2020-04-11: 2000 mg via INTRAVENOUS
  Filled 2020-04-11: qty 100

## 2020-04-11 MED ORDER — MORPHINE SULFATE (PF) 2 MG/ML IV SOLN: 2.0000 mg | INTRAVENOUS | Status: DC | PRN

## 2020-04-11 MED ORDER — FUROSEMIDE 10 MG/ML IJ SOLN
60.0000 mg | Freq: Once | INTRAMUSCULAR | Status: AC
  Administered 2020-04-11: 60 mg via INTRAVENOUS
  Filled 2020-04-11: qty 6

## 2020-04-11 MED ORDER — PANTOPRAZOLE SODIUM 40 MG PO TBEC
40.0000 mg | DELAYED_RELEASE_TABLET | Freq: Every day | ORAL | Status: DC
  Administered 2020-04-11 – 2020-04-14 (×4): 40 mg via ORAL
  Filled 2020-04-11 (×4): qty 1

## 2020-04-11 MED ORDER — POTASSIUM CHLORIDE 10 MEQ/100ML IV SOLN
10.0000 meq | INTRAVENOUS | Status: AC
  Administered 2020-04-11 (×5): 10 meq via INTRAVENOUS
  Filled 2020-04-11 (×5): qty 100

## 2020-04-11 MED ORDER — FENTANYL 12 MCG/HR TD PT72: 1.0000 | MEDICATED_PATCH | TRANSDERMAL | Status: DC

## 2020-04-11 MED ORDER — FUROSEMIDE 10 MG/ML IJ SOLN
60.0000 mg | Freq: Two times a day (BID) | INTRAMUSCULAR | Status: DC
  Administered 2020-04-11 – 2020-04-12 (×3): 60 mg via INTRAVENOUS
  Filled 2020-04-11 (×3): qty 6

## 2020-04-11 MED ORDER — SODIUM PHOSPHATES 45 MMOLE/15ML IV SOLN
20.0000 mmol | Freq: Once | INTRAVENOUS | Status: AC
  Administered 2020-04-11: 20 mmol via INTRAVENOUS
  Filled 2020-04-11: qty 6.67

## 2020-04-11 MED ORDER — FUROSEMIDE 10 MG/ML IJ SOLN: 60.0000 mg | Freq: Two times a day (BID) | INTRAMUSCULAR | Status: DC

## 2020-04-11 MED ORDER — MAGNESIUM SULFATE 50 % IJ SOLN
3.0000 g | Freq: Once | INTRAVENOUS | Status: AC
  Administered 2020-04-11: 3 g via INTRAVENOUS
  Filled 2020-04-11 (×2): qty 6

## 2020-04-11 MED ORDER — ALBUMIN HUMAN 25 % IV SOLN
25.0000 g | Freq: Once | INTRAVENOUS | Status: AC
  Administered 2020-04-11: 25 g via INTRAVENOUS
  Filled 2020-04-11: qty 100

## 2020-04-11 MED ORDER — LORAZEPAM 2 MG/ML IJ SOLN
1.0000 mg | INTRAMUSCULAR | Status: DC | PRN
  Administered 2020-04-11 – 2020-04-13 (×3): 2 mg via INTRAVENOUS
  Filled 2020-04-11 (×3): qty 1

## 2020-04-11 MED ORDER — DIPHENHYDRAMINE HCL 50 MG/ML IJ SOLN
INTRAMUSCULAR | Status: AC
  Administered 2020-04-12: 50 mg
  Filled 2020-04-11: qty 1

## 2020-04-11 MED ORDER — MORPHINE SULFATE (PF) 2 MG/ML IV SOLN
2.0000 mg | Freq: Once | INTRAVENOUS | Status: AC
  Administered 2020-04-11: 2 mg via INTRAVENOUS
  Filled 2020-04-11: qty 1

## 2020-04-11 MED ORDER — LEVALBUTEROL HCL 1.25 MG/0.5ML IN NEBU: 1.2500 mg | INHALATION_SOLUTION | Freq: Four times a day (QID) | RESPIRATORY_TRACT | Status: DC | PRN

## 2020-04-11 NOTE — Plan of Care (Signed)

## 2020-04-11 NOTE — Progress Notes (Signed)
Provider on call  paged at 2232. Pt receiving albumin at 77ml/hr. After first dose pt reports having burning and itching to the skin. Hives are present accompanied by uncontrolled itching and swollen eyes. Current BP 84/49, 02 Sat @ 92% after placement of NRB, RR 25 and HR 62.

## 2020-04-11 NOTE — Significant Event (Addendum)
Rapid Response Event Note  Overview: Acute respiratory distress  Initial Focused Assessment: Acute desaturations following albumin dose. Pt c/o itching and burning then had acute desaturation episode to the 70s. Pt was immedicately placed on NRB mask at 15L. Upon arrival, pt is sitting up in bed, answering questions, no stridor and denies any trouble swallowing. BBS bibasilar rales. No cough. +JVD. Some mild increased WOB with abdominal accessory muscle use and tachypnea. PCXR ordered. Pt has been diuresed today with good result.  2355- HR 65, 88/63 (73), RR 21 with sats 93% on NRB mask at 15L  Interventions: -Stat PCXR -NRB mask-wean as tolerated back to [place] -PIV  Plan of Care (if not transferred): -Notify primary provider of events and further orders.  -Recommend treating for possible rxn to Albumin -Keep HOB greater than 30 degrees -Wean 02 as tolerated for sats >92%  Event Summary: Call received [phone] Arrived at call 2351 Call ended 0030  Madelynn Done

## 2020-04-11 NOTE — Progress Notes (Signed)
Patient's HR in the lower 50s. Patient asymptomatic. BP 107/57 (73) @0344 . Dr. Cyd Silence made aware. No new orders received. Will continue to monitor the patient.

## 2020-04-11 NOTE — Progress Notes (Signed)
PROGRESS NOTE    Ryan [name]  VOJ:[id number] DOB: [birth year] DOA: 04/09/2020 PCP: [name], MD     Brief Narrative:  74 y.o.WM PMHx HTN, HLD, dilated cardiomyopathy, atrial fibrillation/atrial flutter on Eliquis,anemia, and nephrolithiasis.  Does not use O2 at home.   Presents with complaints of nausea, vomiting, and diarrhea over the last 4 days.  Patient reports that he had eaten his normal peanut butter and salad dressing sandwiches as he is always done previously without issue.  However started having diarrhea and subsequently developed nausea and vomiting.  He was seen in the emergency department 2 days ago for urinary retention and a Foley catheter was placed.  He was advised to follow-up with urology but had not been able to set up appointment as of yet.  Denies any blood in his emesis, but states that his diarrhea is a reddish-yellow color now.  Associated symptoms included lower abdominal pain.  Today he had gotten so weak that he was unable to get up and therefore came into the hospital.  While in the hospital patient did complain of chest tightness  ED Course: Upon admission into the emergency department patient was noted to be afebrile with patient 16-22 and all other vital signs maintained. Labs significant for hemoglobin 12.3, potassium 2.9 , and troponin 241. Patient had been given 324 mg of aspirin and nitroglycerin with resolution of pain symptoms. Due to chest x-ray findings patient had been given azithromycin and  Rocephin.   Subjective: 7/8    afebrile overnight, A/O x4, negative N/V/D.  States still feels a little queasy but significantly improved.  Would like to try to advance his diet.  Was able to urinate post catheter removal   Assessment & Plan: Covid vaccination; positive vaccination   Principal Problem:   Nausea, vomiting, and diarrhea Active Problems:   Essential hypertension   Atypical chest pain   CAD (coronary atherosclerotic disease)   A-fib  (HCC)   Elevated troponin   Hypomagnesemia   Hypokalemia   Acute urinary retention   Gastritis   Dilated cardiomyopathy (HCC)   Emphysema lung (HCC)  Nausea, vomiting, diarrhea/gastritis -7/6 CT scan abdomen consistent with gastritis see results below -Patient feels somewhat improved but still queasy -7/7 advance diet to [employer].  Counseled patient and wife on what this consists of, no matter what they send up -7/8 DC normal saline 39ml/hr -Colonoscopy recommended however patient not stable enough to tolerate.  As soon as patient's respiratory status/cardiac status stabilized we will consult GI  Dilated cardiomyopathy -On last echocardiogram 03/23/2018 EF preserved but severely dilated LEFT/RIGHT atrium -Strict in and out -448ml -Daily weight Filed Weights   04/09/20 2116  Weight: 78.5 kg  -Lasix 20 mg daily (hold) -Metoprolol 25 mg BID -NTG 0.4 mg PRN -Cardiology recommends no ischemic further work-up  Atypical chest pain/elevated troponin -Most consistent with demand ischemia secondary to patient's above illness -Trend troponin Results for ONDRE, SALVETTI "L" (MRN [id number]) as of 04/10/2020 18:30  Ref. Range 04/09/2020 10:39 04/09/2020 14:08  Troponin I (High Sensitivity) Latest Ref Range: <18 ng/L 241 (HH) 187 (HH)   Paroxysmal atrial fibrillation -See dilated cardiomyopathy  Emphysema -Not on home O2 -Titrate O2 to maintain SPO2> 92% -Xopenex QID PRN  Pulmonary edema -7/8 on top of patient's underlying lung disease developed pulmonary edema.  ADDENDUM; patient awoke this afternoon with chest tightness and feeling short of breath.  Earlier in the day he felt tightness in his chest after going to the  bathroom troponin minimally elevated, EKG WNL -Lasix IV 60 mg BID.  Will hold Lasix p.o. 20 mg daily -Albumin 25 g x 1 -Morphine 2 mg Q6hr PRN pain or air hunger -7/9 PCXR -Also obtain respiratory virus panel rule out viral etiology given patient's PCXR on 7/8 see results  will  Aspiration pneumonia -Continue current course of antibiotics -Procalcitonin/lactic acid pending, will de-escalate depending upon findings given patient afebrile, negative leukocytosis -7/8 given patient's lack of leukocytosis, fever, normal procalcitonin, normal lactic acid will DC antibiotics.  Given patient's poor lung function and compromised cardiac function will have low threshold to restart.  ILD -7/8 if patient stable in a.m. will obtain HRCT to better quantify lung disease.  Anxiety -Lorazepam q 4 hr PRN  Hypokalemia -Potassium goal> 4 -Potassium IV 50 mEq  Hypomagnesmia (acute) -Magnesium goal> 2 -Magnesium IV 3 g  Hypocalcemia -Calcium gluconate 2 g  Hypophosphatemia -Sodium phosphate 20 mmol  Drug abuse? -7/7 UDS negative -EtOH negative -NO drug abuse.  BPH/acute urinary retention -Recently came to the emergency department and required placement of Foley catheter for urinary retention.  He was advised to follow-up with urology in the outpatient setting but has not done so yet.  -Voiding trial successful. -Bladder scan q shift -Flomax 0.4 mg daily      DVT prophylaxis: Apixaban Code Status: Full Family Communication: 7/8 wife at bedside for discussion of plan of care Status is: Inpatient    Dispo: The patient is from: Home              Anticipated d/c is to:               Anticipated d/c date is: 7/9              Patient currently unstable currently electrolytes significantly outside WNL, not able to consume food yet.      Consultants:  Cardiology Dr. Peter Martinique   Procedures/Significant Events:  7/6 CT abdomen pelvis W contrast; Sigmoid diverticulosis without evidence of diverticulitis. -Mild prostatic enlargement. -Small LEFT inguinal and tiny umbilical hernias containing fat. -Marked diffuse gastric wall thickening question gastritis though infiltrative processes and tumor are not excluded; follow-up endoscopy recommended to  exclude tumor. 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 7/8 PCXR; Background emphysema and interstitial lung disease. increasing reticular and interstitial opacities in the upper lung zones over the past 2 days which may represent superimposed pulmonary edema or atypical infection.   I have personally reviewed and interpreted all radiology studies and my findings are as above.  VENTILATOR SETTINGS:    Cultures 7/6 SARS coronavirus negative 7/8 respiratory virus panel pending   Antimicrobials: Anti-infectives (From admission, onward)   Start     Ordered Stop   04/10/20 1000  cefTRIAXone (ROCEPHIN) 1 g in sodium chloride 0.9 % 100 mL IVPB  Status:  Discontinued        04/09/20 1836 04/11/20 1146   04/10/20 1000  azithromycin (ZITHROMAX) 500 mg in sodium chloride 0.9 % 250 mL IVPB  Status:  Discontinued        04/09/20 1836 04/11/20 1146   04/09/20 1300  azithromycin (ZITHROMAX) 500 mg in sodium chloride 0.9 % 250 mL IVPB        04/09/20 1259 04/09/20 1540   04/09/20 0915  cefTRIAXone (ROCEPHIN) 2 g in sodium chloride 0.9 % 100 mL IVPB  Status:  Discontinued        04/09/20 0912 04/09/20 0913   04/09/20 0915  cefTRIAXone (ROCEPHIN)  1 g in sodium chloride 0.9 % 100 mL IVPB        04/09/20 0913 04/09/20 1041       Devices    LINES / TUBES:      Continuous Infusions: . azithromycin (ZITHROMAX) 500 MG IVPB (Vial-Mate Adaptor) Stopped (04/10/20 1230)  . cefTRIAXone (ROCEPHIN)  IV Stopped (04/10/20 1100)     Objective: Vitals:   04/10/20 2010 04/10/20 2136 04/10/20 2346 04/11/20 0344  BP:  (!) 119/54 (!) 104/51 (!) 107/57  Pulse:  64 (!) 58 (!) 58  Resp:   19 18  Temp:   99.2 F (37.3 C) 98.9 F (37.2 C)  TempSrc:   Oral Oral  SpO2: 96%  95% 93%  Weight:      Height:        Intake/Output Summary (Last 24 hours) at 04/11/2020 0750 Last data filed at 04/10/2020 2149 Gross per 24 hour  Intake 1059 ml  Output 800 ml  Net 259 ml    Filed Weights   04/09/20 2116  Weight: 78.5 kg   Physical Exam:  General: A/O x4, positive acute on chronic respiratory distress Eyes: negative scleral hemorrhage, negative anisocoria, negative icterus ENT: Negative Runny nose, negative gingival bleeding, Neck:  Negative scars, masses, torticollis, lymphadenopathy, JVD Lungs: tachypneic, bibasilar crackles, using accessory muscles to breathe. Cardiovascular: Tachycardic without murmur gallop or rub normal S1 and S2 Abdomen: negative abdominal pain, nondistended, positive soft, bowel sounds, no rebound, no ascites, no appreciable mass Extremities: No significant cyanosis, clubbing, or edema bilateral lower extremities Skin: Negative rashes, lesions, ulcers Psychiatric:  Negative depression, positive anxiety, negative fatigue, negative mania  Central nervous system:  Cranial nerves II through XII intact, tongue/uvula midline, all extremities muscle strength 5/5, sensation intact throughout,negative dysarthria, negative expressive aphasia, negative receptive aphasia. Examination:   .     Data Reviewed: Care during the described time interval was provided by me .  I have reviewed this patient's available data, including medical history, events of note, physical examination, and all test results as part of my evaluation.  CBC: Recent Labs  Lab 04/09/20 0950 04/10/20 0315 04/11/20 0225  WBC 9.4 7.7 7.7  NEUTROABS 6.8  --  4.8  HGB 12.3* 10.7* 9.8*  HCT 38.1* 31.4* 29.1*  MCV 93.6 89.0 89.8  PLT 237 218 779   Basic Metabolic Panel: Recent Labs  Lab 04/09/20 0950 04/09/20 1408 04/10/20 0315 04/10/20 0930 04/11/20 0225  NA 136  --  135 138 135  K 2.9*  --  3.3* 4.2 3.4*  CL 100  --  106 106 106  CO2 22  --  19* 21* 21*  GLUCOSE 96  --  95 107* 102*  BUN 10  --  8 5* 7*  CREATININE 1.02  --  0.99 0.98 1.09  CALCIUM 7.1*  --  6.4* 6.9* 7.5*  MG  --  0.4* 0.9* 1.5* 1.8  PHOS  --   --   --  1.9* 2.4*   GFR: Estimated  Creatinine Clearance: 57.1 mL/min (by C-G formula based on SCr of 1.09 mg/dL). Liver Function Tests: Recent Labs  Lab 04/09/20 0950 04/10/20 0930 04/11/20 0225  AST 23 21 17   ALT 16 13 12   ALKPHOS 50 44 46  BILITOT 1.4* 0.8 1.3*  PROT 6.3* 5.8* 5.4*  ALBUMIN 3.2* 2.7* 2.5*   Recent Labs  Lab 04/09/20 0950  LIPASE 29   No results for input(s): AMMONIA in the last 168 hours. Coagulation Profile: Recent Labs  Lab 04/09/20 0950  INR 1.3*   Cardiac Enzymes: No results for input(s): CKTOTAL, CKMB, CKMBINDEX, TROPONINI in the last 168 hours. BNP (last 3 results) No results for input(s): PROBNP in the last 8760 hours. HbA1C: No results for input(s): HGBA1C in the last 72 hours. CBG: Recent Labs  Lab 04/09/20 0857  GLUCAP 100*   Lipid Profile: Recent Labs    04/09/20 1408  CHOL 208*  HDL 25*  LDLCALC 167*  TRIG 80  CHOLHDL 8.3   Thyroid Function Tests: No results for input(s): TSH, T4TOTAL, FREET4, T3FREE, THYROIDAB in the last 72 hours. Anemia Panel: No results for input(s): VITAMINB12, FOLATE, FERRITIN, TIBC, IRON, RETICCTPCT in the last 72 hours. Sepsis Labs: Recent Labs  Lab 04/09/20 0950 04/10/20 1921 04/10/20 2313 04/11/20 0225  PROCALCITON  --  <0.10  --  <0.10  LATICACIDVEN 1.4 1.7 1.0  --     Recent Results (from the past 240 hour(s))  Urine culture     Status: None   Collection Time: 04/08/20  5:41 AM   Specimen: Urine, Clean Catch  Result Value Ref Range Status   Specimen Description   Final    URINE, CLEAN CATCH Performed at [facility], [place] [address] [place], [place] [address]    Special Requests   Final    NONE Performed at [facility], [place] [address] [place], [place] [address]    Culture   Final    NO GROWTH Performed at [facility], [place] [address], [place], [place] [address]    Report Status 04/09/2020 FINAL  Final  Blood culture (routine x 2)     Status: None (Preliminary result)    Collection Time: 04/09/20  9:51 AM   Specimen: BLOOD  Result Value Ref Range Status   Specimen Description BLOOD RIGHT ANTECUBITAL  Final   Special Requests   Final    BOTTLES DRAWN AEROBIC AND ANAEROBIC Blood Culture adequate volume   Culture   Final    NO GROWTH 1 DAY Performed at [facility], [place] [address], [place], [place] [address]    Report Status PENDING  Incomplete  Blood culture (routine x 2)     Status: None (Preliminary result)   Collection Time: 04/09/20  9:51 AM   Specimen: BLOOD  Result Value Ref Range Status   Specimen Description BLOOD LEFT ANTECUBITAL  Final   Special Requests   Final    BOTTLES DRAWN AEROBIC AND ANAEROBIC Blood Culture results may not be optimal due to an inadequate volume of blood received in culture bottles   Culture   Final    NO GROWTH 1 DAY Performed at [facility], [place] [address], [place], [place] [address]    Report Status PENDING  Incomplete  Urine culture     Status: None   Collection Time: 04/09/20 10:25 AM   Specimen: Urine, Random  Result Value Ref Range Status   Specimen Description URINE, RANDOM  Final   Special Requests NONE  Final   Culture   Final    NO GROWTH Performed at [facility], [place] [address] [place], [place] [address]    Report Status 04/10/2020 FINAL  Final  SARS Coronavirus 2 by RT PCR (hospital order, performed in [facility] hospital lab) Nasopharyngeal Nasopharyngeal Swab     Status: None   Collection Time: 04/09/20 12:40 PM   Specimen: Nasopharyngeal Swab  Result Value Ref Range Status   SARS Coronavirus  2 NEGATIVE NEGATIVE Final    Comment: (NOTE) SARS-CoV-2 target nucleic acids are NOT DETECTED.  The SARS-CoV-2 RNA is generally detectable in upper and lower respiratory specimens during the acute phase of infection. The lowest concentration of SARS-CoV-2 viral copies this assay can detect is 250 copies / mL. A negative result does not preclude SARS-CoV-2 infection and should not  be used as the sole basis for treatment or other patient management decisions.  A negative result may occur with improper specimen collection / handling, submission of specimen other than nasopharyngeal swab, presence of viral mutation(s) within the areas targeted by this assay, and inadequate number of viral copies (<250 copies / mL). A negative result must be combined with clinical observations, patient history, and epidemiological information.  Fact Sheet for Patients:   StrictlyIdeas.no  Fact Sheet for Healthcare Providers: BankingDealers.co.za  This test is not yet approved or  cleared by the [place] FDA and has been authorized for detection and/or diagnosis of SARS-CoV-2 by FDA under an Emergency Use Authorization (EUA).  This EUA will remain in effect (meaning this test can be used) for the duration of the COVID-19 declaration under Section 564(b)(1) of the Act, 21 U.S.C. section 360bbb-3(b)(1), unless the authorization is terminated or revoked sooner.  Performed at [facility], [place] [address], [place], [place] [address]   MRSA PCR Screening     Status: None   Collection Time: 04/09/20  9:22 PM   Specimen: Nasal Mucosa; Nasopharyngeal  Result Value Ref Range Status   MRSA by PCR NEGATIVE NEGATIVE Final    Comment:        The GeneXpert MRSA Assay (FDA approved for NASAL specimens only), is one component of a comprehensive MRSA colonization surveillance program. It is not intended to diagnose MRSA infection nor to guide or monitor treatment for MRSA infections. Performed at [facility], [place] [address] [place], [place] [address]          Radiology Studies: CT ABDOMEN PELVIS W CONTRAST  Result Date: 04/09/2020 CLINICAL DATA:  Severe lower abdominal pain since last Friday with nausea, vomiting and diarrhea, suspected abdominal abscess/infection; history cholecystectomy, appendectomy, former smoker,  hypertension, kidney stones, COPD, GERD, coronary artery disease, atrial fibrillation, alcohol abuse EXAM: CT ABDOMEN AND PELVIS WITH CONTRAST TECHNIQUE: Multidetector CT imaging of the abdomen and pelvis was performed using the standard protocol following bolus administration of intravenous contrast. Sagittal and coronal MPR images reconstructed from axial data set. Exam utilized mA and/or kV adjustment based on patient size in order to minimize patient radiation dose. CONTRAST:  172mL OMNIPAQUE IOHEXOL 300 MG/ML SOLN IV. No oral contrast. COMPARISON:  03/24/2018 FINDINGS: Lower chest: Emphysematous changes with chronic interstitial disease at the posterior lung bases. Hepatobiliary: Gallbladder surgically absent. No focal hepatic abnormalities Pancreas: Tiny calcification at pancreatic body. Pancreas otherwise normal appearance Spleen: Normal appearance Adrenals/Urinary Tract: Adrenal glands, kidneys, and ureters normal appearance. Foley catheter decompresses urinary bladder. Bladder wall appears thickened, may be related to artifact from underdistention but other causes of wall thickening not excluded. Stomach/Bowel: Appendix surgically absent by history. Sigmoid diverticulosis without evidence of diverticulitis. Large and small bowel loops otherwise unremarkable. Marked diffuse gastric wall thickening, may reflect gastritis but infiltrative processes and tumor not excluded. Vascular/Lymphatic: Atherosclerotic calcifications aorta and iliac arteries as well as femoral in coronary arteries. Aorta normal caliber. No adenopathy. Reproductive: Mild prostatic enlargement gland measuring 4.4 x 3.8 cm image 76 Other: Small LEFT inguinal hernia containing fat. Tiny umbilical hernia containing  fat. No free air or free fluid. Musculoskeletal: Osseous demineralization. IMPRESSION: Sigmoid diverticulosis without evidence of diverticulitis. Mild prostatic enlargement. Small LEFT inguinal and tiny umbilical hernias containing  fat. Marked diffuse gastric wall thickening question gastritis though infiltrative processes and tumor are not excluded; follow-up endoscopy recommended to exclude tumor. 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 Aortic Atherosclerosis (ICD10-I70.0) and Emphysema (ICD10-J43.9). Electronically Signed   By: Lavonia Dana M.D.   On: 04/09/2020 13:03   DG Chest Portable 1 View  Result Date: 04/09/2020 CLINICAL DATA:  Chest pain EXAM: PORTABLE CHEST 1 VIEW COMPARISON:  March 22, 2018 FINDINGS: There is somewhat ill-defined airspace opacity in the right base region. Lungs elsewhere are clear. Heart is upper normal in size with pulmonary vascularity normal. No adenopathy. Postoperative change noted in the lower cervical region. IMPRESSION: Ill-defined opacity consistent with pneumonia right base. This area is somewhat less well-defined than on previous study, but there appears to be overall slight increase in opacity in this area compared to the previous study. Lungs elsewhere clear. Stable cardiac silhouette. No adenopathy. Followup PA and lateral chest radiographs recommended in 3-4 weeks following trial of antibiotic therapy to ensure resolution and exclude underlying malignancy. Electronically Signed   By: Lowella Grip III M.D.   On: 04/09/2020 12:11        Scheduled Meds: . apixaban  5 mg Oral BID  . brimonidine  1 drop Left Eye BID  . Chlorhexidine Gluconate Cloth  6 each Topical Daily  . furosemide  20 mg Oral Daily  . latanoprost  1 drop Left Eye QHS  . metoprolol tartrate  25 mg Oral BID  . pantoprazole (PROTONIX) IV  40 mg Intravenous Q12H  . propafenone  225 mg Oral BID  . sodium chloride flush  3 mL Intravenous Q12H  . tamsulosin  0.4 mg Oral Daily   Continuous Infusions: . azithromycin (ZITHROMAX) 500 MG IVPB (Vial-Mate Adaptor) Stopped (04/10/20 1230)  . cefTRIAXone (ROCEPHIN)  IV Stopped (04/10/20 1100)     LOS: 1 day    Time spent:40  min    Claudis Giovanelli, Geraldo Docker, MD [facility] Pager [phone]  If 7PM-7AM, please contact night-coverage [email or web address] Password [email or web address] 04/11/2020, 7:50 AM

## 2020-04-12 ENCOUNTER — Inpatient Hospital Stay (HOSPITAL_COMMUNITY): Payer: Medicare Other

## 2020-04-12 LAB — COMPREHENSIVE METABOLIC PANEL
ALT: 14 U/L (ref 0–44)
AST: 19 U/L (ref 15–41)
Albumin: 3 g/dL — ABNORMAL LOW (ref 3.5–5.0)
Alkaline Phosphatase: 49 U/L (ref 38–126)
Anion gap: 11 (ref 5–15)
BUN: 8 mg/dL (ref 8–23)
CO2: 21 mmol/L — ABNORMAL LOW (ref 22–32)
Calcium: 7.9 mg/dL — ABNORMAL LOW (ref 8.9–10.3)
Chloride: 102 mmol/L (ref 98–111)
Creatinine, Ser: 1.21 mg/dL (ref 0.61–1.24)
GFR calc Af Amer: >60 mL/min (ref >60)
GFR calc non Af Amer: 59 mL/min — ABNORMAL LOW (ref >60)
Glucose, Bld: 120 mg/dL — ABNORMAL HIGH (ref 70–99)
Potassium: 3.9 mmol/L (ref 3.5–5.1)
Sodium: 134 mmol/L — ABNORMAL LOW (ref 135–145)
Total Bilirubin: 1.7 mg/dL — ABNORMAL HIGH (ref 0.3–1.2)
Total Protein: 6.2 g/dL — ABNORMAL LOW (ref 6.5–8.1)

## 2020-04-12 LAB — RESPIRATORY PANEL BY PCR

## 2020-04-12 LAB — MAGNESIUM: Magnesium: 1.7 mg/dL (ref 1.7–2.4)

## 2020-04-12 LAB — BLOOD GAS, ARTERIAL
Acid-base deficit: 0.6 mmol/L (ref 0.0–2.0)
Bicarbonate: 22.4 mmol/L (ref 20.0–28.0)
Drawn by: 36496
FIO2: 76
O2 Saturation: 99.6 %
Patient temperature: 36.8
pCO2 arterial: 29.6 mmHg — ABNORMAL LOW (ref 32.0–48.0)
pH, Arterial: 7.49 — ABNORMAL HIGH (ref 7.350–7.450)
pO2, Arterial: 152 mmHg — ABNORMAL HIGH (ref 83.0–108.0)

## 2020-04-12 LAB — PROCALCITONIN: Procalcitonin: 0.1 ng/mL

## 2020-04-12 LAB — CBC WITH DIFFERENTIAL/PLATELET
Abs Immature Granulocytes: 0.07 10*3/uL (ref 0.00–0.07)
Basophils Absolute: 0 10*3/uL (ref 0.0–0.1)
Basophils Relative: 0 %
Eosinophils Absolute: 0 10*3/uL (ref 0.0–0.5)
Eosinophils Relative: 0 %
HCT: 33.6 % — ABNORMAL LOW (ref 39.0–52.0)
Hemoglobin: 11.5 g/dL — ABNORMAL LOW (ref 13.0–17.0)
Immature Granulocytes: 1 %
Lymphocytes Relative: 10 %
Lymphs Abs: 1.2 10*3/uL (ref 0.7–4.0)
MCH: 30.5 pg (ref 26.0–34.0)
MCHC: 34.2 g/dL (ref 30.0–36.0)
MCV: 89.1 fL (ref 80.0–100.0)
Monocytes Absolute: 0.8 10*3/uL (ref 0.1–1.0)
Monocytes Relative: 6 %
Neutro Abs: 9.9 10*3/uL — ABNORMAL HIGH (ref 1.7–7.7)
Neutrophils Relative %: 83 %
Platelets: 242 10*3/uL (ref 150–400)
RBC: 3.77 MIL/uL — ABNORMAL LOW (ref 4.22–5.81)
RDW: 12.5 % (ref 11.5–15.5)
WBC: 12 10*3/uL — ABNORMAL HIGH (ref 4.0–10.5)
nRBC: 0 % (ref 0.0–0.2)

## 2020-04-12 LAB — PHOSPHORUS: Phosphorus: 2.2 mg/dL — ABNORMAL LOW (ref 2.5–4.6)

## 2020-04-12 MED ORDER — FUROSEMIDE 10 MG/ML IJ SOLN: 60.0000 mg | Freq: Every day | INTRAMUSCULAR | Status: DC

## 2020-04-12 MED ORDER — DIPHENHYDRAMINE HCL 50 MG/ML IJ SOLN: 25.0000 mg | Freq: Once | INTRAMUSCULAR | Status: AC

## 2020-04-12 MED ORDER — SODIUM CHLORIDE 0.9 % IV BOLUS: 1000.0000 mL | Freq: Once | INTRAVENOUS | Status: DC

## 2020-04-12 MED ORDER — METHYLPREDNISOLONE SODIUM SUCC 125 MG IJ SOLR
125.0000 mg | Freq: Once | INTRAMUSCULAR | Status: AC
  Administered 2020-04-12: 125 mg via INTRAVENOUS
  Filled 2020-04-12: qty 2

## 2020-04-12 MED ORDER — EPINEPHRINE (ANAPHYLAXIS) 1 MG/ML IJ SOLN
0.3000 mg | Freq: Once | INTRAMUSCULAR | Status: AC
  Administered 2020-04-12: 0.3 mg via INTRAMUSCULAR
  Filled 2020-04-12: qty 0.3

## 2020-04-12 MED ORDER — MENTHOL 3 MG MT LOZG
1.0000 | LOZENGE | OROMUCOSAL | Status: DC | PRN
  Administered 2020-04-12: 3 mg via ORAL
  Filled 2020-04-12: qty 9

## 2020-04-12 NOTE — Significant Event (Addendum)
HOSPITAL MEDICINE EVENT NOTE (7/9 12:01AM)  Called and notified by nursing that while patient was receiving intravenous albumin at approximately 11:30 PM, patient suddenly began to develop respiratory distress, diffuse rash with hives, tachycardia, confusion and hypotension as low as 64/51.  I went to go evaluate the patient at the bedside.  Patient was in respiratory distress with notable bibasilar rales and mild expiratory wheezing.  Patient is lethargic but arousable and confused.  Patient is exhibiting a regular tachycardia on examination.  Skin examination reveals diffuse hyperemic rash with hives, see pictures below:      Clinically, considering patient's respiratory distress, diffuse rash, delirium and transient hypotension that patient is suffering from anaphylaxis secondary to exposure to albumin.  Concerns for impending anaphylactic shock.  Patient has been administered 25 mg IV Benadryl, 125 mg IV Solu-Medrol, 0.3 mg IM epinephrine and nebulized albuterol.  Blood pressure came up quickly without intravenous fluid bolus and therefore we abstain from providing this for fear of putting patient into further volume overload.    Patient seems to be slowly clinically improving.  Nursing place patient on nonrebreather mask and I have advised them to wean supplemental oxygen to keep oxygen saturations 92% and above.  We will closely monitor throughout the night for further bouts of hypotension or respiratory distress.  Sherryll Burger Breanah Faddis

## 2020-04-12 NOTE — Progress Notes (Signed)
PROGRESS NOTE    Ryan [name]  QVZ:[id number] DOB: May 02, 1946 DOA: 04/09/2020 PCP: [name], MD     Brief Narrative:  74 y.o.WM PMHx HTN, HLD, dilated cardiomyopathy, atrial fibrillation/atrial flutter on Eliquis,anemia, and nephrolithiasis.  Does not use O2 at home.   Presents with complaints of nausea, vomiting, and diarrhea over the last 4 days.  Patient reports that he had eaten his normal peanut butter and salad dressing sandwiches as he is always done previously without issue.  However started having diarrhea and subsequently developed nausea and vomiting.  He was seen in the emergency department 2 days ago for urinary retention and a Foley catheter was placed.  He was advised to follow-up with urology but had not been able to set up appointment as of yet.  Denies any blood in his emesis, but states that his diarrhea is a reddish-yellow color now.  Associated symptoms included lower abdominal pain.  Today he had gotten so weak that he was unable to get up and therefore came into the hospital.  While in the hospital patient did complain of chest tightness  ED Course: Upon admission into the emergency department patient was noted to be afebrile with patient 16-22 and all other vital signs maintained. Labs significant for hemoglobin 12.3, potassium 2.9 , and troponin 241. Patient had been given 324 mg of aspirin and nitroglycerin with resolution of pain symptoms. Due to chest x-ray findings patient had been given azithromycin and  Rocephin.   Subjective: 7/9 overnight.  Anaphylaxis reaction to albumin overnight.  Afebrile overnight.  A/O x4, much more comfortable today and interactive with staff.     Assessment & Plan: Covid vaccination; positive vaccination   Principal Problem:   Nausea, vomiting, and diarrhea Active Problems:   Essential hypertension   Atypical chest pain   CAD (coronary atherosclerotic disease)   A-fib (HCC)   Elevated troponin   Hypomagnesemia    Hypokalemia   Acute urinary retention   Gastritis   Dilated cardiomyopathy (HCC)   Emphysema lung (HCC)  Nausea, vomiting, diarrhea/gastritis -7/6 CT scan abdomen consistent with gastritis see results below -Patient feels somewhat improved but still queasy -7/7 advance diet to [employer].  Counseled patient and wife on what this consists of, no matter what they send up -7/8 DC normal saline 67ml/hr -Colonoscopy recommended however patient not stable enough to tolerate.  As soon as patient's respiratory status/cardiac status stabilized we will consult GI.  Dilated cardiomyopathy -On last echocardiogram 03/23/2018 EF preserved but severely dilated LEFT/RIGHT atrium -Strict in and out - 2.9 L -Daily weight Filed Weights   04/09/20 2116 04/12/20 0609  Weight: 78.5 kg 78.9 kg  -Lasix 20 mg daily (hold) -7/9 decrease Lasix IV 60 mg daily, creatinine increasing -Metoprolol 25 mg BID -NTG 0.4 mg PRN -Cardiology recommends no ischemic further work-up  Atypical chest pain/elevated troponin -Most consistent with demand ischemia secondary to patient's above illness -Trend troponin Results for KONOR, NOREN "L" (MRN [id number]) as of 04/10/2020 18:30  Ref. Range 04/09/2020 10:39 04/09/2020 14:08  Troponin I (High Sensitivity) Latest Ref Range: <18 ng/L 241 (HH) 187 (HH)   Paroxysmal atrial fibrillation -See dilated cardiomyopathy  Emphysema -Not on home O2 -Titrate O2 to maintain SPO2> 92% -Xopenex QID PRN  Pulmonary edema -7/8 on top of patient's underlying lung disease developed pulmonary edema.  ADDENDUM; patient awoke this afternoon with chest tightness and feeling short of breath.  Earlier in the day he felt tightness in his chest after going  to the bathroom troponin minimally elevated, EKG WNL -See cardiomyopathy  -Albumin 25 g x 1 -Morphine 2 mg Q6hr PRN pain or air hunger -7/9 PCXR -7/9 respiratory virus panel negative   Aspiration pneumonia -Continue current course of  antibiotics -Procalcitonin/lactic acid pending, will de-escalate depending upon findings given patient afebrile, negative leukocytosis -7/8 given patient's lack of leukocytosis, fever, normal procalcitonin, normal lactic acid will DC antibiotics.  Given patient's poor lung function and compromised cardiac function will have low threshold to restart.  ILD -7/10 if patient stable in a.m. will obtain HRCT to better quantify lung disease.  Anxiety -Lorazepam q 4 hr PRN  Hypokalemia -Potassium goal> 4  Hypomagnesmia (acute) -Magnesium goal> 2  Hypocalcemia -Stable, continue to monitor  Hypophosphatemia -Stable, continue to monitor  Drug abuse? -7/7 UDS negative -EtOH negative -NO drug abuse.  BPH/acute urinary retention -Recently came to the emergency department and required placement of Foley catheter for urinary retention.  He was advised to follow-up with urology in the outpatient setting but has not done so yet.  -Voiding trial successful. -Bladder scan q shift -Flomax 0.4 mg daily      DVT prophylaxis: Apixaban Code Status: Full Family Communication: 7/8 wife at bedside for discussion of plan of care Status is: Inpatient    Dispo: The patient is from: Home              Anticipated d/c is to:               Anticipated d/c date is: 7/9              Patient currently unstable currently electrolytes significantly outside WNL, not able to consume food yet.      Consultants:  Cardiology Dr. Peter Martinique   Procedures/Significant Events:  7/6 CT abdomen pelvis W contrast; Sigmoid diverticulosis without evidence of diverticulitis. -Mild prostatic enlargement. -Small LEFT inguinal and tiny umbilical hernias containing fat. -Marked diffuse gastric wall thickening question gastritis though infiltrative processes and tumor are not excluded; follow-up endoscopy recommended to exclude tumor. -Emphysematous lung bases with chronic basilar interstitial lung  disease. 7/8 PCXR; Background emphysema and interstitial lung disease. increasing reticular and interstitial opacities in the upper lung zones over the past 2 days which may represent superimposed pulmonary edema or atypical infection.   I have personally reviewed and interpreted all radiology studies and my findings are as above.  VENTILATOR SETTINGS:    Cultures 7/6 SARS coronavirus negative 7/8 respiratory virus panel pending 7/9 respiratory virus panel negative    Antimicrobials: Anti-infectives (From admission, onward)   Start     Ordered Stop   04/10/20 1000  cefTRIAXone (ROCEPHIN) 1 g in sodium chloride 0.9 % 100 mL IVPB  Status:  Discontinued        04/09/20 1836 04/11/20 1146   04/10/20 1000  azithromycin (ZITHROMAX) 500 mg in sodium chloride 0.9 % 250 mL IVPB  Status:  Discontinued        04/09/20 1836 04/11/20 1146   04/09/20 1300  azithromycin (ZITHROMAX) 500 mg in sodium chloride 0.9 % 250 mL IVPB        04/09/20 1259 04/09/20 1540   04/09/20 0915  cefTRIAXone (ROCEPHIN) 2 g in sodium chloride 0.9 % 100 mL IVPB  Status:  Discontinued        04/09/20 0912 04/09/20 0913   04/09/20 0915  cefTRIAXone (ROCEPHIN) 1 g in sodium chloride 0.9 % 100 mL IVPB        04/09/20 0913 04/09/20 1041  Devices    LINES / TUBES:      Continuous Infusions: . sodium chloride       Objective: Vitals:   04/12/20 0349 04/12/20 0609 04/12/20 0718 04/12/20 0746  BP: (!) 118/57   131/76  Pulse: 60 64 (!) 57 72  Resp: (!) 21 10 19 19   Temp: 98.9 F (37.2 C)   98.5 F (36.9 C)  TempSrc: Oral   Oral  SpO2: 97% 97% 96% 94%  Weight:  78.9 kg    Height:        Intake/Output Summary (Last 24 hours) at 04/12/2020 0759 Last data filed at 04/12/2020 0000 Gross per 24 hour  Intake 3 ml  Output 2875 ml  Net -2872 ml   Filed Weights   04/09/20 2116 04/12/20 0609  Weight: 78.5 kg 78.9 kg   Physical Exam:  General: A/O x4, positive acute respiratory distress Eyes:  negative scleral hemorrhage, negative anisocoria, negative icterus ENT: Negative Runny nose, negative gingival bleeding, Neck:  Negative scars, masses, torticollis, lymphadenopathy, JVD Lungs: Mild bibasilar crackles.  Cardiovascular: Regular rate and rhythm without murmur gallop or rub normal S1 and S2 Abdomen: negative abdominal pain, nondistended, positive soft, bowel sounds, no rebound, no ascites, no appreciable mass Extremities: No significant cyanosis, clubbing, or edema bilateral lower extremities Skin: Negative rashes, lesions, ulcers Psychiatric:  Negative depression, negative anxiety, negative fatigue, negative mania  Central nervous system:  Cranial nerves II through XII intact, tongue/uvula midline, all extremities muscle strength 5/5, sensation intact throughout, negative dysarthria, negative expressive aphasia, negative receptive aphasia.  .     Data Reviewed: Care during the described time interval was provided by me .  I have reviewed this patient's available data, including medical history, events of note, physical examination, and all test results as part of my evaluation.  CBC: Recent Labs  Lab 04/09/20 0950 04/10/20 0315 04/11/20 0225 04/12/20 0314  WBC 9.4 7.7 7.7 12.0*  NEUTROABS 6.8  --  4.8 9.9*  HGB 12.3* 10.7* 9.8* 11.5*  HCT 38.1* 31.4* 29.1* 33.6*  MCV 93.6 89.0 89.8 89.1  PLT 237 218 208 341   Basic Metabolic Panel: Recent Labs  Lab 04/10/20 0315 04/10/20 0930 04/11/20 0225 04/11/20 1517 04/12/20 0314  NA 135 138 135 135 134*  K 3.3* 4.2 3.4* 4.4 3.9  CL 106 106 106 106 102  CO2 19* 21* 21* 21* 21*  GLUCOSE 95 107* 102* 104* 120*  BUN 8 5* 7* 8 8  CREATININE 0.99 0.98 1.09 0.99 1.21  CALCIUM 6.4* 6.9* 7.5* 7.9* 7.9*  MG 0.9* 1.5* 1.8 2.3 1.7  PHOS  --  1.9* 2.4* 2.3* 2.2*   GFR: Estimated Creatinine Clearance: 51.6 mL/min (by C-G formula based on SCr of 1.21 mg/dL). Liver Function Tests: Recent Labs  Lab 04/09/20 0950 04/10/20 0930  04/11/20 0225 04/12/20 0314  AST 23 21 17 19   ALT 16 13 12 14   ALKPHOS 50 44 46 49  BILITOT 1.4* 0.8 1.3* 1.7*  PROT 6.3* 5.8* 5.4* 6.2*  ALBUMIN 3.2* 2.7* 2.5* 3.0*   Recent Labs  Lab 04/09/20 0950  LIPASE 29   No results for input(s): AMMONIA in the last 168 hours. Coagulation Profile: Recent Labs  Lab 04/09/20 0950  INR 1.3*   Cardiac Enzymes: No results for input(s): CKTOTAL, CKMB, CKMBINDEX, TROPONINI in the last 168 hours. BNP (last 3 results) No results for input(s): PROBNP in the last 8760 hours. HbA1C: No results for input(s): HGBA1C in the last 72 hours. CBG:  Recent Labs  Lab 04/09/20 0857  GLUCAP 100*   Lipid Profile: Recent Labs    04/09/20 1408  CHOL 208*  HDL 25*  LDLCALC 167*  TRIG 80  CHOLHDL 8.3   Thyroid Function Tests: No results for input(s): TSH, T4TOTAL, FREET4, T3FREE, THYROIDAB in the last 72 hours. Anemia Panel: No results for input(s): VITAMINB12, FOLATE, FERRITIN, TIBC, IRON, RETICCTPCT in the last 72 hours. Sepsis Labs: Recent Labs  Lab 04/09/20 0950 04/10/20 1921 04/10/20 2313 04/11/20 0225 04/12/20 0314  PROCALCITON  --  <0.10  --  <0.10 0.10  LATICACIDVEN 1.4 1.7 1.0  --   --     Recent Results (from the past 240 hour(s))  Urine culture     Status: None   Collection Time: 04/08/20  5:41 AM   Specimen: Urine, Clean Catch  Result Value Ref Range Status   Specimen Description   Final    URINE, CLEAN CATCH Performed at [facility], [place] [address] [place], [place] [address]    Special Requests   Final    NONE Performed at [facility], [place] [address], [place], [place] [address]    Culture   Final    NO GROWTH Performed at [facility], [place] [address], [place], [place] [address]    Report Status 04/09/2020 FINAL  Final  Blood culture (routine x 2)     Status: None (Preliminary result)   Collection Time: 04/09/20  9:51 AM   Specimen: BLOOD  Result Value Ref Range  Status   Specimen Description BLOOD RIGHT ANTECUBITAL  Final   Special Requests   Final    BOTTLES DRAWN AEROBIC AND ANAEROBIC Blood Culture adequate volume   Culture   Final    NO GROWTH 2 DAYS Performed at [facility], [place] [address], [place], [place] [address]    Report Status PENDING  Incomplete  Blood culture (routine x 2)     Status: None (Preliminary result)   Collection Time: 04/09/20  9:51 AM   Specimen: BLOOD  Result Value Ref Range Status   Specimen Description BLOOD LEFT ANTECUBITAL  Final   Special Requests   Final    BOTTLES DRAWN AEROBIC AND ANAEROBIC Blood Culture results may not be optimal due to an inadequate volume of blood received in culture bottles   Culture   Final    NO GROWTH 2 DAYS Performed at [facility], [place] [address] [place], [place] [address]    Report Status PENDING  Incomplete  Urine culture     Status: None   Collection Time: 04/09/20 10:25 AM   Specimen: Urine, Random  Result Value Ref Range Status   Specimen Description URINE, RANDOM  Final   Special Requests NONE  Final   Culture   Final    NO GROWTH Performed at [facility], [place] [address], [place], [place] [address]    Report Status 04/10/2020 FINAL  Final  SARS Coronavirus 2 by RT PCR (hospital order, performed in [facility] hospital lab) Nasopharyngeal Nasopharyngeal Swab     Status: None   Collection Time: 04/09/20 12:40 PM   Specimen: Nasopharyngeal Swab  Result Value Ref Range Status   SARS Coronavirus 2 NEGATIVE NEGATIVE Final    Comment: (NOTE) SARS-CoV-2 target nucleic acids are NOT DETECTED.  The SARS-CoV-2 RNA is generally detectable in upper and lower respiratory specimens during the acute phase of infection. The lowest concentration of SARS-CoV-2 viral copies this  assay can detect is 250 copies / mL. A negative result does not preclude SARS-CoV-2 infection and should not be used as the sole basis for treatment or other patient management decisions.   A negative result may occur with improper specimen collection / handling, submission of specimen other than nasopharyngeal swab, presence of viral mutation(s) within the areas targeted by this assay, and inadequate number of viral copies (<250 copies / mL). A negative result must be combined with clinical observations, patient history, and epidemiological information.  Fact Sheet for Patients:   StrictlyIdeas.no  Fact Sheet for Healthcare Providers: BankingDealers.co.za  This test is not yet approved or  cleared by the [place] FDA and has been authorized for detection and/or diagnosis of SARS-CoV-2 by FDA under an Emergency Use Authorization (EUA).  This EUA will remain in effect (meaning this test can be used) for the duration of the COVID-19 declaration under Section 564(b)(1) of the Act, 21 U.S.C. section 360bbb-3(b)(1), unless the authorization is terminated or revoked sooner.  Performed at [facility], [place] [address], [place], [place] [address]   MRSA PCR Screening     Status: None   Collection Time: 04/09/20  9:22 PM   Specimen: Nasal Mucosa; Nasopharyngeal  Result Value Ref Range Status   MRSA by PCR NEGATIVE NEGATIVE Final    Comment:        The GeneXpert MRSA Assay (FDA approved for NASAL specimens only), is one component of a comprehensive MRSA colonization surveillance program. It is not intended to diagnose MRSA infection nor to guide or monitor treatment for MRSA infections. Performed at [facility], [place] [address], [place], [place] [address]          Radiology Studies: DG CHEST PORT 1 VIEW  Result Date: 04/12/2020 CLINICAL DATA:  Pulmonary edema. EXAM: PORTABLE CHEST 1 VIEW COMPARISON:  Prior study same day. Chest x-ray 04/11/2020, 03/27/2017. FINDINGS: Mediastinum hilar structures normal. Stable cardiomegaly. No pulmonary venous congestion. Persistent bilateral predominantly basilar  interstitial prominence consistent with interstitial edema and or pneumonitis. Similar findings noted on prior exam. Small right pleural effusion cannot be excluded. No pneumothorax. Prior cervical spine fusion. IMPRESSION: 1. Persistent bilateral predominantly basilar interstitial prominence consistent with interstitial edema and or pneumonitis. Similar findings noted on prior exam. Small right pleural effusion cannot be excluded. 2.  Stable cardiomegaly.  No pulmonary venous congestion. Electronically Signed   By: Marcello Moores  Register   On: 04/12/2020 06:24   DG Chest Port 1 View  Result Date: 04/12/2020 CLINICAL DATA:  Acute respiratory distress and inpatient EXAM: PORTABLE CHEST 1 VIEW COMPARISON:  Radiograph 04/11/2020 FINDINGS: Increasing basilar opacity predominantly in the right infrahilar lung on a background of more chronic fine reticular interstitial and emphysematous changes seen on comparison imaging. There is a new small right pleural effusion. No left effusion. No pneumothorax. Stable cardiomediastinal contours. No acute osseous or soft tissue abnormality. Prior cervical fusion. Telemetry leads overlie the chest. IMPRESSION: 1. Increasing basilar opacity predominantly in the right infrahilar lung with new right effusion, may reflect edema on a background of more chronic interstitial change. Atypical infection is possible though less favored in the absence of clinical symptoms. Electronically Signed   By: Lovena Le M.D.   On: 04/12/2020 00:22   DG CHEST PORT 1 VIEW  Result Date: 04/11/2020 CLINICAL DATA:  Chest pain. Abdominal pain. EXAM: PORTABLE CHEST 1 VIEW COMPARISON:  Chest radiograph and abdominal CT 04/09/2020 FINDINGS: Subpleural reticulation with questionable fibrotic change at the lung  bases, similar to prior exam. Underlying emphysema. There are increasing interstitial and reticular opacities in the left greater than right mid and upper lung zones. Mild cardiomegaly with unchanged  mediastinal contours. No pneumothorax or large pleural effusion. Surgical hardware in the lower cervical spine is partially included. No acute osseous abnormalities are seen. IMPRESSION: 1. Background emphysema and interstitial lung disease. There are increasing reticular and interstitial opacities in the upper lung zones over the past 2 days which may represent superimposed pulmonary edema or atypical infection. 2. Mild cardiomegaly. Electronically Signed   By: Keith Rake M.D.   On: 04/11/2020 15:17        Scheduled Meds: . apixaban  5 mg Oral BID  . brimonidine  1 drop Left Eye BID  . Chlorhexidine Gluconate Cloth  6 each Topical Daily  . furosemide  60 mg Intravenous BID  . latanoprost  1 drop Left Eye QHS  . metoprolol tartrate  25 mg Oral BID  . pantoprazole  40 mg Oral Daily  . propafenone  225 mg Oral BID  . sodium chloride flush  3 mL Intravenous Q12H  . tamsulosin  0.4 mg Oral Daily   Continuous Infusions: . sodium chloride       LOS: 2 days    Time spent:40 min    Francisco Ostrovsky, Geraldo Docker, MD [facility] Pager [phone]  If 7PM-7AM, please contact night-coverage [email or web address] Password [email or web address] 04/12/2020, 7:59 AM

## 2020-04-12 NOTE — Plan of Care (Signed)
  Problem: Education: Goal: Knowledge of General Education information will improve Description: Including pain rating scale, medication(s)/side effects and non-pharmacologic comfort measures 04/12/2020 0729 by Shanon Ace, RN Outcome: Progressing 04/11/2020 2041 by Shanon Ace, RN Outcome: Progressing   Problem: Health Behavior/Discharge Planning: Goal: Ability to manage health-related needs will improve 04/12/2020 0729 by Shanon Ace, RN Outcome: Progressing 04/11/2020 2041 by Shanon Ace, RN Outcome: Progressing   Problem: Clinical Measurements: Goal: Ability to maintain clinical measurements within normal limits will improve 04/12/2020 0729 by Shanon Ace, RN Outcome: Progressing 04/11/2020 2041 by Shanon Ace, RN Outcome: Progressing Goal: Will remain free from infection 04/12/2020 0729 by Shanon Ace, RN Outcome: Progressing 04/11/2020 2041 by Shanon Ace, RN Outcome: Progressing Goal: Diagnostic test results will improve 04/12/2020 0729 by Shanon Ace, RN Outcome: Progressing 04/11/2020 2041 by Shanon Ace, RN Outcome: Progressing Goal: Respiratory complications will improve 04/12/2020 0729 by Shanon Ace, RN Outcome: Progressing 04/11/2020 2041 by Shanon Ace, RN Outcome: Progressing Goal: Cardiovascular complication will be avoided 04/12/2020 0729 by Shanon Ace, RN Outcome: Progressing 04/11/2020 2041 by Shanon Ace, RN Outcome: Progressing   Problem: Activity: Goal: Risk for activity intolerance will decrease 04/12/2020 0729 by Shanon Ace, RN Outcome: Progressing 04/11/2020 2041 by Shanon Ace, RN Outcome: Progressing   Problem: Nutrition: Goal: Adequate nutrition will be maintained 04/12/2020 0729 by Shanon Ace, RN Outcome: Progressing 04/11/2020 2041 by Shanon Ace, RN Outcome: Progressing   Problem: Coping: Goal: Level of anxiety will decrease 04/12/2020 0729 by Shanon Ace, RN Outcome: Progressing 04/11/2020 2041 by Shanon Ace,  RN Outcome: Progressing   Problem: Elimination: Goal: Will not experience complications related to bowel motility 04/12/2020 0729 by Shanon Ace, RN Outcome: Progressing 04/11/2020 2041 by Shanon Ace, RN Outcome: Progressing Goal: Will not experience complications related to urinary retention 04/12/2020 0729 by Shanon Ace, RN Outcome: Progressing 04/11/2020 2041 by Shanon Ace, RN Outcome: Progressing   Problem: Pain Managment: Goal: General experience of comfort will improve 04/12/2020 0729 by Shanon Ace, RN Outcome: Progressing 04/11/2020 2041 by Shanon Ace, RN Outcome: Progressing   Problem: Safety: Goal: Ability to remain free from injury will improve 04/12/2020 0729 by Shanon Ace, RN Outcome: Progressing 04/11/2020 2041 by Shanon Ace, RN Outcome: Progressing   Problem: Skin Integrity: Goal: Risk for impaired skin integrity will decrease 04/12/2020 0729 by Shanon Ace, RN Outcome: Progressing 04/11/2020 2041 by Shanon Ace, RN Outcome: Progressing

## 2020-04-13 LAB — COMPREHENSIVE METABOLIC PANEL
ALT: 15 U/L (ref 0–44)
AST: 19 U/L (ref 15–41)
Albumin: 3 g/dL — ABNORMAL LOW (ref 3.5–5.0)
Alkaline Phosphatase: 49 U/L (ref 38–126)
Anion gap: 12 (ref 5–15)
BUN: 24 mg/dL — ABNORMAL HIGH (ref 8–23)
CO2: 21 mmol/L — ABNORMAL LOW (ref 22–32)
Calcium: 8.4 mg/dL — ABNORMAL LOW (ref 8.9–10.3)
Chloride: 103 mmol/L (ref 98–111)
Creatinine, Ser: 1.36 mg/dL — ABNORMAL HIGH (ref 0.61–1.24)
GFR calc Af Amer: 59 mL/min — ABNORMAL LOW (ref >60)
GFR calc non Af Amer: 51 mL/min — ABNORMAL LOW (ref >60)
Glucose, Bld: 133 mg/dL — ABNORMAL HIGH (ref 70–99)
Potassium: 3.7 mmol/L (ref 3.5–5.1)
Sodium: 136 mmol/L (ref 135–145)
Total Bilirubin: 0.9 mg/dL (ref 0.3–1.2)
Total Protein: 6.3 g/dL — ABNORMAL LOW (ref 6.5–8.1)

## 2020-04-13 LAB — CBC WITH DIFFERENTIAL/PLATELET
Abs Immature Granulocytes: 0.09 10*3/uL — ABNORMAL HIGH (ref 0.00–0.07)
Basophils Absolute: 0 10*3/uL (ref 0.0–0.1)
Basophils Relative: 0 %
Eosinophils Absolute: 0 10*3/uL (ref 0.0–0.5)
Eosinophils Relative: 0 %
HCT: 32.8 % — ABNORMAL LOW (ref 39.0–52.0)
Hemoglobin: 11.5 g/dL — ABNORMAL LOW (ref 13.0–17.0)
Immature Granulocytes: 1 %
Lymphocytes Relative: 9 %
Lymphs Abs: 1.3 10*3/uL (ref 0.7–4.0)
MCH: 31 pg (ref 26.0–34.0)
MCHC: 35.1 g/dL (ref 30.0–36.0)
MCV: 88.4 fL (ref 80.0–100.0)
Monocytes Absolute: 0.6 10*3/uL (ref 0.1–1.0)
Monocytes Relative: 4 %
Neutro Abs: 11.5 10*3/uL — ABNORMAL HIGH (ref 1.7–7.7)
Neutrophils Relative %: 86 %
Platelets: 266 10*3/uL (ref 150–400)
RBC: 3.71 MIL/uL — ABNORMAL LOW (ref 4.22–5.81)
RDW: 12.3 % (ref 11.5–15.5)
WBC: 13.5 10*3/uL — ABNORMAL HIGH (ref 4.0–10.5)
nRBC: 0 % (ref 0.0–0.2)

## 2020-04-13 LAB — PHOSPHORUS: Phosphorus: 1.9 mg/dL — ABNORMAL LOW (ref 2.5–4.6)

## 2020-04-13 LAB — MAGNESIUM: Magnesium: 1.7 mg/dL (ref 1.7–2.4)

## 2020-04-13 MED ORDER — MAGNESIUM SULFATE 2 GM/50ML IV SOLN
2.0000 g | Freq: Once | INTRAVENOUS | Status: AC
  Administered 2020-04-13: 2 g via INTRAVENOUS
  Filled 2020-04-13: qty 50

## 2020-04-13 MED ORDER — POTASSIUM PHOSPHATES 15 MMOLE/5ML IV SOLN
20.0000 mmol | Freq: Once | INTRAVENOUS | Status: AC
  Administered 2020-04-13: 20 mmol via INTRAVENOUS
  Filled 2020-04-13: qty 6.67

## 2020-04-13 NOTE — Progress Notes (Signed)
PROGRESS NOTE    Ryan [name]  NLZ:[id number] DOB: 1945-12-20 DOA: 04/09/2020 PCP: [name], MD     Brief Narrative:  74 y.o.WM PMHx HTN, HLD, dilated cardiomyopathy, atrial fibrillation/atrial flutter on Eliquis,anemia, and nephrolithiasis.  Does not use O2 at home.   Presents with complaints of nausea, vomiting, and diarrhea over the last 4 days.  Patient reports that he had eaten his normal peanut butter and salad dressing sandwiches as he is always done previously without issue.  However started having diarrhea and subsequently developed nausea and vomiting.  He was seen in the emergency department 2 days ago for urinary retention and a Foley catheter was placed.  He was advised to follow-up with urology but had not been able to set up appointment as of yet.  Denies any blood in his emesis, but states that his diarrhea is a reddish-yellow color now.  Associated symptoms included lower abdominal pain.  Today he had gotten so weak that he was unable to get up and therefore came into the hospital.  While in the hospital patient did complain of chest tightness  ED Course: Upon admission into the emergency department patient was noted to be afebrile with patient 16-22 and all other vital signs maintained. Labs significant for hemoglobin 12.3, potassium 2.9 , and troponin 241. Patient had been given 324 mg of aspirin and nitroglycerin with resolution of pain symptoms. Due to chest x-ray findings patient had been given azithromycin and  Rocephin.   Subjective: 7/10 afebrile overnight A/O x4, much more comfortable today joking with family and staff.  Wife states that she has noticed patient stop breathing when he sleeping and then startled awake and start rebreathing.  Per patient had sleep study~5 years ago and passed the test.   Assessment & Plan: Covid vaccination; positive vaccination   Principal Problem:   Nausea, vomiting, and diarrhea Active Problems:   Essential hypertension    Atypical chest pain   CAD (coronary atherosclerotic disease)   A-fib (HCC)   Elevated troponin   Hypomagnesemia   Hypokalemia   Acute urinary retention   Gastritis   Dilated cardiomyopathy ([place])   Emphysema lung (HCC)  Nausea, vomiting, diarrhea/ Diffuse Gastritis -7/6 CT scan abdomen consistent with gastritis see results below -Patient feels somewhat improved but still queasy -7/7 advance diet to [employer].  Counseled patient and wife on what this consists of, no matter what they send up -7/8 DC normal saline 12ml/hr -7/10 colonoscopy recommended.  Spoke at length with patient and wife and has had several colonoscopies where polyps have been removed.  His next scheduled colonoscopy was 2023.  States has a GI physician at [place].  Therefore will make a copy of all radiology studies and see his on GI physician when discharged.   Dilated cardiomyopathy -On last echocardiogram 03/23/2018 EF preserved but severely dilated LEFT/RIGHT atrium -Strict in and out -4.7 L -Daily weight Filed Weights   04/09/20 2116 04/12/20 0609 04/13/20 0345  Weight: 78.5 kg 78.9 kg 77.9 kg  -Lasix 20 mg daily (hold) -7/10 Lasix IV 60 mg daily (hold), creatinine increasing -Metoprolol 25 mg BID -NTG 0.4 mg PRN -Cardiology recommends no ischemic further work-up  Atypical chest pain/elevated troponin -Most consistent with demand ischemia secondary to patient's above illness -Trend troponin Results for LADARIUS, SEUBERT "L" (MRN [id number]) as of 04/10/2020 18:30  Ref. Range 04/09/2020 10:39 04/09/2020 14:08  Troponin I (High Sensitivity) Latest Ref Range: <18 ng/L 241 (HH) 187 (HH)   Paroxysmal  atrial fibrillation -See dilated cardiomyopathy  Emphysema -Not on home O2 -Titrate O2 to maintain SPO2> 92% -Xopenex QID PRN -7/10 ambulatory SPO2 pending  Pulmonary edema -7/8 on top of patient's underlying lung disease developed pulmonary edema.  ADDENDUM; patient awoke this afternoon with chest tightness  and feeling short of breath.  Earlier in the day he felt tightness in his chest after going to the bathroom troponin minimally elevated, EKG WNL -See cardiomyopathy  -Albumin 25 g x 1 -Morphine 2 mg Q6hr PRN pain or air hunger -7/9 PCXR -7/9 respiratory virus panel negative   Aspiration pneumonia -Continue current course of antibiotics -Procalcitonin/lactic acid pending, will de-escalate depending upon findings given patient afebrile, negative leukocytosis -7/8 given patient's lack of leukocytosis, fever, normal procalcitonin, normal lactic acid will DC antibiotics.  Given patient's poor lung function and compromised cardiac function will have low threshold to restart.  ILD -7/10 if patient stable in a.m. will obtain HRCT to better quantify lung disease.  AKI (baseline Cr 0.98) -Iatrogenic most likely secondary to overdiuresis -See dilated cardiomyopathy   Anxiety -Lorazepam q 4 hr PRN  Hypokalemia -Potassium goal> 4 -K-Phos 20 mmol  Hypomagnesmia (acute) -Magnesium goal> 2 -Magnesium IV 2 g  Hypocalcemia -Stable, continue to monitor  Hypophosphatemia -See hypokalemia  Drug abuse -7/7 UDS negative -EtOH negative -NO drug abuse.  BPH/acute urinary retention -Recently came to the emergency department and required placement of Foley catheter for urinary retention.  He was advised to follow-up with urology in the outpatient setting but has not done so yet.  -Voiding trial successful. -Bladder scan q shift -Flomax 0.4 mg daily   Goals of care -7/10 PT/OT consult; patient with gastritis, dilated cardiomyopathy, emphysema.  Evaluate for CIR vs SNF vs home health      DVT prophylaxis: Apixaban Code Status: Full Family Communication: 7/10 wife at bedside for discussion of plan of care Status is: Inpatient    Dispo: The patient is from: Home              Anticipated d/c is to:               Anticipated d/c date is: 7/9              Patient currently unstable  currently electrolytes significantly outside WNL, not able to consume food yet.      Consultants:  Cardiology Dr. Peter Martinique   Procedures/Significant Events:  7/6 CT abdomen pelvis W contrast; Sigmoid diverticulosis without evidence of diverticulitis. -Mild prostatic enlargement. -Small LEFT inguinal and tiny umbilical hernias containing fat. -Marked diffuse gastric wall thickening question gastritis though infiltrative processes and tumor are not excluded; follow-up endoscopy recommended to exclude tumor. 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 7/8 PCXR; Background emphysema and interstitial lung disease. increasing reticular and interstitial opacities in the upper lung zones over the past 2 days which may represent superimposed pulmonary edema or atypical infection.   I have personally reviewed and interpreted all radiology studies and my findings are as above.  VENTILATOR SETTINGS:    Cultures 7/6 SARS coronavirus negative 7/8 respiratory virus panel pending 7/9 respiratory virus panel negative    Antimicrobials: Anti-infectives (From admission, onward)   Start     Ordered Stop   04/10/20 1000  cefTRIAXone (ROCEPHIN) 1 g in sodium chloride 0.9 % 100 mL IVPB  Status:  Discontinued        04/09/20 1836 04/11/20 1146   04/10/20 1000  azithromycin (ZITHROMAX) 500 mg in sodium chloride  0.9 % 250 mL IVPB  Status:  Discontinued        04/09/20 1836 04/11/20 1146   04/09/20 1300  azithromycin (ZITHROMAX) 500 mg in sodium chloride 0.9 % 250 mL IVPB        04/09/20 1259 04/09/20 1540   04/09/20 0915  cefTRIAXone (ROCEPHIN) 2 g in sodium chloride 0.9 % 100 mL IVPB  Status:  Discontinued        04/09/20 0912 04/09/20 0913   04/09/20 0915  cefTRIAXone (ROCEPHIN) 1 g in sodium chloride 0.9 % 100 mL IVPB        04/09/20 0913 04/09/20 1041       Devices    LINES / TUBES:      Continuous Infusions: . sodium chloride        Objective: Vitals:   04/12/20 2000 04/12/20 2313 04/13/20 0345 04/13/20 0739  BP:  130/60 140/60 (!) 135/57  Pulse:  60 (!) 58 69  Resp:  19 18 20   Temp: 97.9 F (36.6 C) 97.8 F (36.6 C) 97.8 F (36.6 C) 97.6 F (36.4 C)  TempSrc: Oral Oral Oral Other (Comment)  SpO2:  95% 98% 94%  Weight:   77.9 kg   Height:        Intake/Output Summary (Last 24 hours) at 04/13/2020 0901 Last data filed at 04/13/2020 7741 Gross per 24 hour  Intake 240 ml  Output 2050 ml  Net -1810 ml   Filed Weights   04/09/20 2116 04/12/20 0609 04/13/20 0345  Weight: 78.5 kg 78.9 kg 77.9 kg   Physical Exam:  General: A/O x4, no acute respiratory distress Eyes: negative scleral hemorrhage, negative anisocoria, negative icterus ENT: Negative Runny nose, negative gingival bleeding, Neck:  Negative scars, masses, torticollis, lymphadenopathy, JVD Lungs: Clear to auscultation bilaterally without wheezes or crackles Cardiovascular: Regular rate and rhythm without murmur gallop or rub normal S1 and S2 Abdomen: negative abdominal pain, nondistended, positive soft, bowel sounds, no rebound, no ascites, no appreciable mass Extremities: No significant cyanosis, clubbing, or edema bilateral lower extremities Skin: Negative rashes, lesions, ulcers Psychiatric:  Negative depression, negative anxiety, negative fatigue, negative mania  Central nervous system:  Cranial nerves II through XII intact, tongue/uvula midline, all extremities muscle strength 5/5, sensation intact throughout, negative dysarthria, negative expressive aphasia, negative receptive aphasia.  .     Data Reviewed: Care during the described time interval was provided by me .  I have reviewed this patient's available data, including medical history, events of note, physical examination, and all test results as part of my evaluation.  CBC: Recent Labs  Lab 04/09/20 0950 04/10/20 0315 04/11/20 0225 04/12/20 0314 04/13/20 0237  WBC 9.4  7.7 7.7 12.0* 13.5*  NEUTROABS 6.8  --  4.8 9.9* 11.5*  HGB 12.3* 10.7* 9.8* 11.5* 11.5*  HCT 38.1* 31.4* 29.1* 33.6* 32.8*  MCV 93.6 89.0 89.8 89.1 88.4  PLT 237 218 208 242 287   Basic Metabolic Panel: Recent Labs  Lab 04/10/20 0930 04/11/20 0225 04/11/20 1517 04/12/20 0314 04/13/20 0237  NA 138 135 135 134* 136  K 4.2 3.4* 4.4 3.9 3.7  CL 106 106 106 102 103  CO2 21* 21* 21* 21* 21*  GLUCOSE 107* 102* 104* 120* 133*  BUN 5* 7* 8 8 24*  CREATININE 0.98 1.09 0.99 1.21 1.36*  CALCIUM 6.9* 7.5* 7.9* 7.9* 8.4*  MG 1.5* 1.8 2.3 1.7 1.7  PHOS 1.9* 2.4* 2.3* 2.2* 1.9*   GFR: Estimated Creatinine Clearance: 45.6 mL/min (A) (by C-G  formula based on SCr of 1.36 mg/dL (H)). Liver Function Tests: Recent Labs  Lab 04/09/20 0950 04/10/20 0930 04/11/20 0225 04/12/20 0314 04/13/20 0237  AST 23 21 17 19 19   ALT 16 13 12 14 15   ALKPHOS 50 44 46 49 49  BILITOT 1.4* 0.8 1.3* 1.7* 0.9  PROT 6.3* 5.8* 5.4* 6.2* 6.3*  ALBUMIN 3.2* 2.7* 2.5* 3.0* 3.0*   Recent Labs  Lab 04/09/20 0950  LIPASE 29   No results for input(s): AMMONIA in the last 168 hours. Coagulation Profile: Recent Labs  Lab 04/09/20 0950  INR 1.3*   Cardiac Enzymes: No results for input(s): CKTOTAL, CKMB, CKMBINDEX, TROPONINI in the last 168 hours. BNP (last 3 results) No results for input(s): PROBNP in the last 8760 hours. HbA1C: No results for input(s): HGBA1C in the last 72 hours. CBG: Recent Labs  Lab 04/09/20 0857  GLUCAP 100*   Lipid Profile: No results for input(s): CHOL, HDL, LDLCALC, TRIG, CHOLHDL, LDLDIRECT in the last 72 hours. Thyroid Function Tests: No results for input(s): TSH, T4TOTAL, FREET4, T3FREE, THYROIDAB in the last 72 hours. Anemia Panel: No results for input(s): VITAMINB12, FOLATE, FERRITIN, TIBC, IRON, RETICCTPCT in the last 72 hours. Sepsis Labs: Recent Labs  Lab 04/09/20 0950 04/10/20 1921 04/10/20 2313 04/11/20 0225 04/12/20 0314  PROCALCITON  --  <0.10  --  <0.10  0.10  LATICACIDVEN 1.4 1.7 1.0  --   --     Recent Results (from the past 240 hour(s))  Urine culture     Status: None   Collection Time: 04/08/20  5:41 AM   Specimen: Urine, Clean Catch  Result Value Ref Range Status   Specimen Description   Final    URINE, CLEAN CATCH Performed at [facility], [place] [address], [place], [place] [address]    Special Requests   Final    NONE Performed at [facility], [place] [address] [place], [place] [address]    Culture   Final    NO GROWTH Performed at [facility], [place] [address] [place], [place] [address]    Report Status 04/09/2020 FINAL  Final  Blood culture (routine x 2)     Status: None (Preliminary result)   Collection Time: 04/09/20  9:51 AM   Specimen: BLOOD  Result Value Ref Range Status   Specimen Description BLOOD RIGHT ANTECUBITAL  Final   Special Requests   Final    BOTTLES DRAWN AEROBIC AND ANAEROBIC Blood Culture adequate volume   Culture   Final    NO GROWTH 3 DAYS Performed at [facility], [place] [address] [place], [place] [address]    Report Status PENDING  Incomplete  Blood culture (routine x 2)     Status: None (Preliminary result)   Collection Time: 04/09/20  9:51 AM   Specimen: BLOOD  Result Value Ref Range Status   Specimen Description BLOOD LEFT ANTECUBITAL  Final   Special Requests   Final    BOTTLES DRAWN AEROBIC AND ANAEROBIC Blood Culture results may not be optimal due to an inadequate volume of blood received in culture bottles   Culture   Final    NO GROWTH 3 DAYS Performed at [facility], [place] [address] [place],  [address]    Report Status PENDING  Incomplete  Urine culture     Status: None   Collection Time: 04/09/20 10:25 AM   Specimen: Urine, Random  Result Value Ref Range Status  Specimen Description URINE, RANDOM  Final   Special Requests NONE  Final   Culture   Final    NO GROWTH Performed at [facility], [place]  [address], [place], [place] [address]    Report Status 04/10/2020 FINAL  Final  SARS Coronavirus 2 by RT PCR (hospital order, performed in [facility] hospital lab) Nasopharyngeal Nasopharyngeal Swab     Status: None   Collection Time: 04/09/20 12:40 PM   Specimen: Nasopharyngeal Swab  Result Value Ref Range Status   SARS Coronavirus 2 NEGATIVE NEGATIVE Final    Comment: (NOTE) SARS-CoV-2 target nucleic acids are NOT DETECTED.  The SARS-CoV-2 RNA is generally detectable in upper and lower respiratory specimens during the acute phase of infection. The lowest concentration of SARS-CoV-2 viral copies this assay can detect is 250 copies / mL. A negative result does not preclude SARS-CoV-2 infection and should not be used as the sole basis for treatment or other patient management decisions.  A negative result may occur with improper specimen collection / handling, submission of specimen other than nasopharyngeal swab, presence of viral mutation(s) within the areas targeted by this assay, and inadequate number of viral copies (<250 copies / mL). A negative result must be combined with clinical observations, patient history, and epidemiological information.  Fact Sheet for Patients:   StrictlyIdeas.no  Fact Sheet for Healthcare Providers: BankingDealers.co.za  This test is not yet approved or  cleared by the [place] FDA and has been authorized for detection and/or diagnosis of SARS-CoV-2 by FDA under an Emergency Use Authorization (EUA).  This EUA will remain in effect (meaning this test can be used) for the duration of the COVID-19 declaration under Section 564(b)(1) of the Act, 21 U.S.C. section 360bbb-3(b)(1), unless the authorization is terminated or revoked sooner.  Performed at [facility], [place] [address] [place], [place] [address]   MRSA PCR Screening     Status: None   Collection Time: 04/09/20  9:22 PM   Specimen: Nasal  Mucosa; Nasopharyngeal  Result Value Ref Range Status   MRSA by PCR NEGATIVE NEGATIVE Final    Comment:        The GeneXpert MRSA Assay (FDA approved for NASAL specimens only), is one component of a comprehensive MRSA colonization surveillance program. It is not intended to diagnose MRSA infection nor to guide or monitor treatment for MRSA infections. Performed at [facility], [place] [address] [place],  [address]   Respiratory Panel by PCR     Status: None   Collection Time: 04/12/20  9:51 AM   Specimen: Respiratory  Result Value Ref Range Status   Adenovirus NOT DETECTED NOT DETECTED Final   Coronavirus 229E NOT DETECTED NOT DETECTED Final    Comment: (NOTE) The Coronavirus on the Respiratory Panel, DOES NOT test for the novel  Coronavirus (2019 nCoV)    Coronavirus HKU1 NOT DETECTED NOT DETECTED Final   Coronavirus NL63 NOT DETECTED NOT DETECTED Final   Coronavirus OC43 NOT DETECTED NOT DETECTED Final   Metapneumovirus NOT DETECTED NOT DETECTED Final   Rhinovirus / Enterovirus NOT DETECTED NOT DETECTED Final   Influenza A NOT DETECTED NOT DETECTED Final   Influenza B NOT DETECTED NOT DETECTED Final   Parainfluenza Virus 1 NOT DETECTED NOT DETECTED Final   Parainfluenza Virus 2 NOT DETECTED NOT DETECTED Final   Parainfluenza Virus 3 NOT DETECTED NOT DETECTED Final   Parainfluenza Virus 4 NOT DETECTED NOT DETECTED Final   Respiratory Syncytial Virus  NOT DETECTED NOT DETECTED Final   Bordetella pertussis NOT DETECTED NOT DETECTED Final   Chlamydophila pneumoniae NOT DETECTED NOT DETECTED Final   Mycoplasma pneumoniae NOT DETECTED NOT DETECTED Final    Comment: Performed at [facility], [place] [address], [place], [place] [address]         Radiology Studies: DG CHEST PORT 1 VIEW  Result Date: 04/12/2020 CLINICAL DATA:  Pulmonary edema. EXAM: PORTABLE CHEST 1 VIEW COMPARISON:  Prior study same day. Chest x-ray 04/11/2020, 03/27/2017. FINDINGS:  Mediastinum hilar structures normal. Stable cardiomegaly. No pulmonary venous congestion. Persistent bilateral predominantly basilar interstitial prominence consistent with interstitial edema and or pneumonitis. Similar findings noted on prior exam. Small right pleural effusion cannot be excluded. No pneumothorax. Prior cervical spine fusion. IMPRESSION: 1. Persistent bilateral predominantly basilar interstitial prominence consistent with interstitial edema and or pneumonitis. Similar findings noted on prior exam. Small right pleural effusion cannot be excluded. 2.  Stable cardiomegaly.  No pulmonary venous congestion. Electronically Signed   By: Marcello Moores  Register   On: 04/12/2020 06:24   DG Chest Port 1 View  Result Date: 04/12/2020 CLINICAL DATA:  Acute respiratory distress and inpatient EXAM: PORTABLE CHEST 1 VIEW COMPARISON:  Radiograph 04/11/2020 FINDINGS: Increasing basilar opacity predominantly in the right infrahilar lung on a background of more chronic fine reticular interstitial and emphysematous changes seen on comparison imaging. There is a new small right pleural effusion. No left effusion. No pneumothorax. Stable cardiomediastinal contours. No acute osseous or soft tissue abnormality. Prior cervical fusion. Telemetry leads overlie the chest. IMPRESSION: 1. Increasing basilar opacity predominantly in the right infrahilar lung with new right effusion, may reflect edema on a background of more chronic interstitial change. Atypical infection is possible though less favored in the absence of clinical symptoms. Electronically Signed   By: Lovena Le M.D.   On: 04/12/2020 00:22   DG CHEST PORT 1 VIEW  Result Date: 04/11/2020 CLINICAL DATA:  Chest pain. Abdominal pain. EXAM: PORTABLE CHEST 1 VIEW COMPARISON:  Chest radiograph and abdominal CT 04/09/2020 FINDINGS: Subpleural reticulation with questionable fibrotic change at the lung bases, similar to prior exam. Underlying emphysema. There are increasing  interstitial and reticular opacities in the left greater than right mid and upper lung zones. Mild cardiomegaly with unchanged mediastinal contours. No pneumothorax or large pleural effusion. Surgical hardware in the lower cervical spine is partially included. No acute osseous abnormalities are seen. IMPRESSION: 1. Background emphysema and interstitial lung disease. There are increasing reticular and interstitial opacities in the upper lung zones over the past 2 days which may represent superimposed pulmonary edema or atypical infection. 2. Mild cardiomegaly. Electronically Signed   By: Keith Rake M.D.   On: 04/11/2020 15:17        Scheduled Meds: . apixaban  5 mg Oral BID  . brimonidine  1 drop Left Eye BID  . Chlorhexidine Gluconate Cloth  6 each Topical Daily  . furosemide  60 mg Intravenous Daily  . latanoprost  1 drop Left Eye QHS  . metoprolol tartrate  25 mg Oral BID  . pantoprazole  40 mg Oral Daily  . propafenone  225 mg Oral BID  . sodium chloride flush  3 mL Intravenous Q12H  . tamsulosin  0.4 mg Oral Daily   Continuous Infusions: . sodium chloride       LOS: 3 days    Time spent:40 min    Deryl Ports, Geraldo Docker, MD [facility] Pager [phone]  If 7PM-7AM, please contact night-coverage  [email or web address] Password [email or web address] 04/13/2020, 9:01 AM

## 2020-04-14 DIAGNOSIS — J439 Emphysema, unspecified: Secondary | ICD-10-CM

## 2020-04-14 DIAGNOSIS — I2510 Atherosclerotic heart disease of native coronary artery without angina pectoris: Secondary | ICD-10-CM

## 2020-04-14 DIAGNOSIS — R0603 Acute respiratory distress: Secondary | ICD-10-CM

## 2020-04-14 LAB — COMPREHENSIVE METABOLIC PANEL
ALT: 18 U/L (ref 0–44)
AST: 19 U/L (ref 15–41)
Albumin: 2.8 g/dL — ABNORMAL LOW (ref 3.5–5.0)
Alkaline Phosphatase: 47 U/L (ref 38–126)
Anion gap: 10 (ref 5–15)
BUN: 23 mg/dL (ref 8–23)
CO2: 23 mmol/L (ref 22–32)
Calcium: 8.7 mg/dL — ABNORMAL LOW (ref 8.9–10.3)
Chloride: 104 mmol/L (ref 98–111)
Creatinine, Ser: 1.05 mg/dL (ref 0.61–1.24)
GFR calc Af Amer: >60 mL/min (ref >60)
GFR calc non Af Amer: >60 mL/min (ref >60)
Glucose, Bld: 94 mg/dL (ref 70–99)
Potassium: 3.7 mmol/L (ref 3.5–5.1)
Sodium: 137 mmol/L (ref 135–145)
Total Bilirubin: 0.8 mg/dL (ref 0.3–1.2)
Total Protein: 5.8 g/dL — ABNORMAL LOW (ref 6.5–8.1)

## 2020-04-14 LAB — CBC WITH DIFFERENTIAL/PLATELET
Abs Immature Granulocytes: 0.11 10*3/uL — ABNORMAL HIGH (ref 0.00–0.07)
Basophils Absolute: 0.1 10*3/uL (ref 0.0–0.1)
Basophils Relative: 1 %
Eosinophils Absolute: 0.4 10*3/uL (ref 0.0–0.5)
Eosinophils Relative: 4 %
HCT: 31.9 % — ABNORMAL LOW (ref 39.0–52.0)
Hemoglobin: 10.8 g/dL — ABNORMAL LOW (ref 13.0–17.0)
Immature Granulocytes: 1 %
Lymphocytes Relative: 24 %
Lymphs Abs: 2.9 10*3/uL (ref 0.7–4.0)
MCH: 30.4 pg (ref 26.0–34.0)
MCHC: 33.9 g/dL (ref 30.0–36.0)
MCV: 89.9 fL (ref 80.0–100.0)
Monocytes Absolute: 1 10*3/uL (ref 0.1–1.0)
Monocytes Relative: 8 %
Neutro Abs: 7.4 10*3/uL (ref 1.7–7.7)
Neutrophils Relative %: 62 %
Platelets: 277 10*3/uL (ref 150–400)
RBC: 3.55 MIL/uL — ABNORMAL LOW (ref 4.22–5.81)
RDW: 12.5 % (ref 11.5–15.5)
WBC: 12 10*3/uL — ABNORMAL HIGH (ref 4.0–10.5)
nRBC: 0 % (ref 0.0–0.2)

## 2020-04-14 LAB — CULTURE, BLOOD (ROUTINE X 2)
Culture: NO GROWTH
Culture: NO GROWTH
Special Requests: ADEQUATE

## 2020-04-14 LAB — MAGNESIUM: Magnesium: 1.8 mg/dL (ref 1.7–2.4)

## 2020-04-14 LAB — PHOSPHORUS: Phosphorus: 2.1 mg/dL — ABNORMAL LOW (ref 2.5–4.6)

## 2020-04-14 MED ORDER — ONDANSETRON HCL 4 MG PO TABS: 4.0000 mg | ORAL_TABLET | Freq: Four times a day (QID) | ORAL | 0 refills | Status: DC | PRN

## 2020-04-14 MED ORDER — NITROGLYCERIN 0.4 MG SL SUBL: 0.4000 mg | SUBLINGUAL_TABLET | SUBLINGUAL | 0 refills | Status: AC | PRN

## 2020-04-14 MED ORDER — LEVALBUTEROL TARTRATE 45 MCG/ACT IN AERO
2.0000 | INHALATION_SPRAY | RESPIRATORY_TRACT | 0 refills | Status: DC | PRN
Start: 2020-04-14 — End: 2020-06-05

## 2020-04-14 NOTE — Evaluation (Signed)
Physical Therapy Evaluation and Discharge Patient Details Name: Ryan [name] MRN: [id number] DOB: [birth year] Today's Date: 04/14/2020   History of Present Illness  74 y.o.WM PMHx HTN, HLD, dilated cardiomyopathy, atrial fibrillation/atrial flutteron Eliquis,anemia,andnephrolithiasis.  Does not use O2 at home; Presented 04/09/20 with complaints of nausea, vomiting, and diarrhea over the last 4 days leading up to this admission; recent difficulty with urinary retention as well.  Dx:  gastritis.  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    Clinical Impression   Patient evaluated by Physical Therapy with no further acute PT needs identified (due to MD in and planning to discharge pt home after PT evaluation). Patient able to recall most of the education OT provided earlier today. He showed improved safety awareness by asking to use RW for ambulation. He can benefit from continued education on energy conservation and pacing his active lifestyle to facilitate activity tolerance in order to avoid future admissions to hospital. He can also benefit from balance training to hopefully return to ambulating without an assistive device. See below for any follow-up Physical Therapy or equipment needs. PT is signing off. Thank you for this referral.     Follow Up Recommendations Home health PT;Supervision/Assistance - 24 hour    Equipment Recommendations  None recommended by PT    Recommendations for Other Services       Precautions / Restrictions Precautions Precautions: Fall      Mobility  Bed Mobility Overal bed mobility: Needs Assistance Bed Mobility: Supine to Sit;Sit to Supine     Supine to sit: Supervision Sit to supine: Min assist   General bed mobility comments: sitting EOB  Transfers Overall transfer level: Needs assistance Equipment used: Rolling walker (2 wheeled);None;1 person hand held assist Transfers: Sit  to/from [facility] Sit to Stand: Min guard Stand pivot transfers: Min guard       General transfer comment: vc for safe hand placement with use of RW; required cues every transfer  Ambulation/Gait Ambulation/Gait assistance: Min guard Gait Distance (Feet): 120 Feet Assistive device: Rolling walker (2 wheeled) Gait Pattern/deviations: Step-through pattern;Decreased stride length Gait velocity: decr   General Gait Details: Patient maintained O2 sats >97% while walking and talking  Stairs Stairs:  (deferred; pt able to do 5 full squats without UE assist; )       General stair comments: pt able to verbalize correct technique (which he used after TKA); wife present and educated to stand behind pt as he ascends. He has a Lobbyist Rankin (Stroke Patients Only)       Balance Overall balance assessment: Needs assistance Sitting-balance support: Feet supported Sitting balance-Leahy Scale: Good     Standing balance support: During functional activity;No upper extremity supported Standing balance-Leahy Scale: Fair Standing balance comment: able to maintain static standing with min guard - supervision                              Pertinent Vitals/Pain Pain Assessment: Faces Faces Pain Scale: Hurts a little bit Pain Location: generalized  Pain Descriptors / Indicators: Sore Pain Intervention(s): Limited activity within patient's tolerance;Monitored during session    [place] expects to be discharged to:: Private residence Living Arrangements: Spouse/significant other Available Help at Discharge: Family;Available 24 hours/day Type of Home: House Home Access: Stairs to enter Entrance Stairs-Rails: Right Entrance Stairs-Number of Steps: 2 Home Layout:  Multi-level;Able to live on main level with bedroom/bathroom;Laundry or work area in [place]: Environmental consultant - 2 wheels;Walker - standard;Cane -  single point;Cane - quad;Bedside commode;Shower seat - built in;Grab bars - tub/shower Additional Comments: lives with wife who can assist as needed.  Sister in law will also be able to assist as needed     Prior Function Level of Independence: Independent         Comments: Pt is a retired Corporate treasurer for [employer].  He enjoys riding motorcycles as well as Therapist, nutritional.  He has a work shop at his home, and stays very active      Hand Dominance   Dominant Hand: Right    Extremity/Trunk Assessment   Upper Extremity Assessment Upper Extremity Assessment: Generalized weakness    Lower Extremity Assessment Lower Extremity Assessment: Generalized weakness    Cervical / Trunk Assessment Cervical / Trunk Assessment: Normal  Communication   Communication: No difficulties  Cognition Arousal/Alertness: Awake/alert Behavior During Therapy: WFL for tasks assessed/performed Overall Cognitive Status: Within Functional Limits for tasks assessed                                        General Comments General comments (skin integrity, edema, etc.): Patient able to recall recommendations from OT evaluation earlier today. He demonstrated improved safety awareness when offered to try walking again without RW--he chose to use RW.     Exercises     Assessment/Plan    PT Assessment All further PT needs can be met in the next venue of care  PT Problem List Decreased strength;Decreased activity tolerance;Decreased balance;Decreased mobility;Decreased knowledge of use of DME       PT Treatment Interventions      PT Goals (Current goals can be found in the Care Plan section)  Acute Rehab PT Goals Patient Stated Goal: "Please tell Dr Sherral Hammers I can go home today" PT Goal Formulation: All assessment and education complete, DC therapy    Frequency     Barriers to discharge        Co-evaluation               AM-PAC PT "6 Clicks" Mobility  Outcome  Measure Help needed turning from your back to your side while in a flat bed without using bedrails?: None Help needed moving from lying on your back to sitting on the side of a flat bed without using bedrails?: A Little Help needed moving to and from a bed to a chair (including a wheelchair)?: A Little Help needed standing up from a chair using your arms (e.g., wheelchair or bedside chair)?: A Little Help needed to walk in hospital room?: A Little Help needed climbing 3-5 steps with a railing? : A Little 6 Click Score: 19    End of Session   Activity Tolerance: Patient tolerated treatment well Patient left: in bed;with call bell/phone within reach;with family/visitor present Nurse Communication: Mobility status;Other (comment) (pt asking for regular med) PT Visit Diagnosis: Muscle weakness (generalized) (M62.81);Unsteadiness on feet (R26.81)    Time: 5498-2641 PT Time Calculation (min) (ACUTE ONLY): 21 min   Charges:   PT Evaluation $PT Eval Low Complexity: 1 Low           Arby Barrette, PT Pager [phone]   Rexanne Mano 04/14/2020, 12:35 PM

## 2020-04-14 NOTE — Progress Notes (Signed)
SATURATION QUALIFICATIONS: (This note is used to comply with regulatory documentation for home oxygen)  Patient Saturations on Room Air at Rest = 96%  Patient Saturations on Room Air while Ambulating = 94%  Patient Saturations on 0 Liters of oxygen while Ambulating = 93%  Please briefly explain why patient needs home oxygen: Pt does not require home 02 as he is able to maintain 02 sats 93% of greater with activity.  Nilsa Nutting., OTR/L Acute Rehabilitation Services Pager [phone] Office [phone]

## 2020-04-14 NOTE — Discharge Summary (Addendum)
Physician Discharge Summary  Ryan [name] ZOX:[id number] DOB: 19-May-1946 DOA: 04/09/2020  PCP: [name], MD  Admit date: 04/09/2020 Discharge date: 04/14/2020  Time spent: 30 minutes  Recommendations for Outpatient Follow-up:  Nausea, vomiting, diarrhea/ Diffuse Gastritis -7/6 CT scan abdomen consistent with gastritis see results below -7/10 colonoscopy recommended.  Spoke at length with patient and wife and has had several colonoscopies where polyps have been removed.  His next scheduled colonoscopy was 2023.  States has a GI physician at [place].  Therefore will make a copy of all radiology studies and see his own GI physician when discharged.   Dilated cardiomyopathy -On last echocardiogram 03/23/2018 EF preserved but severely dilated LEFT/RIGHT atrium -Strict in and out -4.27 L -Daily weight Filed Weights   04/12/20 0609 04/13/20 0345 04/14/20 0502  Weight: 78.9 kg 77.9 kg 78.8 kg  -Lasix 20 mg daily; regular home dose  -Metoprolol 25 mg BID -NTG 0.4 mg PRN -Cardiology recommends no ischemic further work-up  Atypical chest pain/elevated troponin -Most consistent with demand ischemia secondary to patient's above illness -Trend troponin Results for Ryan, [name]" (MRN [id number]) as of 04/14/2020 11:29  Ref. Range 04/09/2020 10:39 04/09/2020 14:08 04/11/2020 15:17  Troponin I (High Sensitivity) Latest Ref Range: <18 ng/L 241 (HH) 187 (HH) 57 (H)    Paroxysmal atrial fibrillation -See dilated cardiomyopathy -Currently NSR -Continue home dose Eliquis  Emphysema SATURATION QUALIFICATIONS: (Thisnote is usedto comply with regulatory documentation for home oxygen) Patient Saturations on Room Air at Rest =96% Patient Saturations on [employer] while Ambulating =94% Patient Saturations on0Liters of oxygen while Ambulating = 93% Please briefly explain why patient needs home oxygen:Pt does not require home 02 as he is able to maintain 02 sats 93% of greater with  activity. -Patient does not meet criteria for home O2. -Patient to use home breathing treatments.  Pulmonary edema -7/8 on top of patient's underlying lung disease developed pulmonary edema. Diuresed well with albumin and Lasix IV. -See cardiomyopathy  -7/9 respiratory virus panel negative   Aspiration pneumonia -Continue current course of antibiotics -Procalcitonin/lactic acid pending, will de-escalate depending upon findings given patient afebrile, negative leukocytosis -7/8 given patient's lack of leukocytosis, fever, normal procalcitonin, normal lactic acid will DC antibiotics.  Given patient's poor lung function and compromised cardiac function will have low threshold to restart.  ILD -7/11 patient extremely stable no need for HRCT.  Spoke with patient and wife if required PCP will obtain.   AKI (baseline Cr 0.98) -Iatrogenic most likely secondary to overdiuresis -See dilated cardiomyopathy Recent Labs  Lab 04/11/20 0225 04/11/20 1517 04/12/20 0314 04/13/20 0237 04/14/20 0140  CREATININE 1.09 0.99 1.21 1.36* 1.05    Anxiety -Stable  Hypokalemia -Potassium goal> 4  Hypomagnesmia (acute) -Magnesium goal> 2  Hypocalcemia -Stable, continue to monitor  Hypophosphatemia -See hypokalemia  Drug abuse -7/7 UDS negative -EtOH negative -NO drug abuse.  BPH/acute urinary retention -Recently came to the emergency department and required placement of Foley catheter for urinary retention.He was advised to follow-up with urology in the outpatient setting but has not done so yet.  -Voiding trial successful. -Bladder scan q shift -Flomax 0.4 mg daily  Discharge Diagnoses:  Principal Problem:   Nausea, vomiting, and diarrhea Active Problems:   Essential hypertension   Atypical chest pain   CAD (coronary atherosclerotic disease)   A-fib (HCC)   Elevated troponin   Hypomagnesemia   Hypokalemia   Acute urinary retention   Gastritis   Dilated  cardiomyopathy ([place])  Emphysema lung [facility])   Discharge Condition: Stable  Diet recommendation: Heart healthy  Filed Weights   04/12/20 0609 04/13/20 0345 04/14/20 0502  Weight: 78.9 kg 77.9 kg 78.8 kg    History of present illness:  74 y.o.WM PMHx HTN, HLD, dilated cardiomyopathy, atrial fibrillation/atrial flutteron Eliquis,anemia,andnephrolithiasis.  Does not use O2 at home.  Presents with complaints of nausea, vomiting, and diarrhea over the last 4 days. Patient reports that he had eaten his normal peanut butter and salad dressing sandwiches as he is always done previously without issue. However started having diarrhea and subsequently developed nausea and vomiting. He was seen in the emergency department 2 days ago for urinary retention and a Foley catheter was placed. He was advised to follow-up with urology but had not been able to set up appointment as of yet. Denies any blood in his emesis, but states that his diarrhea is a reddish-yellow color now. Associated symptoms included lower abdominal pain. Today he had gotten so weak that he was unable to get up and therefore came into the hospital. While in the hospital patient did complain of chest tightness  ED Course:Upon admission into the emergency department patient was noted to be afebrile with patient 16-22 and all other vital signs maintained. Labs significant for hemoglobin 12.3, potassium 2.9, and troponin 241. Patient had been given 324 mg of aspirin and nitroglycerin with resolution of pain symptoms. Due to chest x-ray findings patient had been given azithromycinandRocephin.  Hospital Course:  See above  Procedures: 7/6 CT abdomen pelvis W contrast; Sigmoid diverticulosis without evidence of diverticulitis. -Mild prostatic enlargement. -Small LEFT inguinal and tiny umbilical hernias containing fat. -Marked diffuse gastric wall thickening question gastritis though infiltrative processes and tumor are not  excluded; follow-up endoscopy recommended to exclude tumor. 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 7/8 PCXR; Background emphysema and interstitial lung disease. increasing reticular and interstitial opacities in the upper lung zones over the past 2 days which may represent superimposed pulmonary edema or atypical infection. 7/9 PCXR;-persistent bilateral predominantly basilar interstitial prominence consistent with interstitial edema and or pneumonitis. -Small right pleural effusion cannot be excluded. -Stable cardiomegaly.  No pulmonary venous congestion  Consultations: Cardiology Dr. Peter Martinique  Cultures  7/6 SARS coronavirus negative 7/8 respiratory virus panel pending 7/9 respiratory virus panel negative   Antibiotics Anti-infectives (From admission, onward)   Start     Ordered Stop   04/10/20 1000  cefTRIAXone (ROCEPHIN) 1 g in sodium chloride 0.9 % 100 mL IVPB  Status:  Discontinued        04/09/20 1836 04/11/20 1146   04/10/20 1000  azithromycin (ZITHROMAX) 500 mg in sodium chloride 0.9 % 250 mL IVPB  Status:  Discontinued        04/09/20 1836 04/11/20 1146   04/09/20 1300  azithromycin (ZITHROMAX) 500 mg in sodium chloride 0.9 % 250 mL IVPB        04/09/20 1259 04/09/20 1540   04/09/20 0915  cefTRIAXone (ROCEPHIN) 2 g in sodium chloride 0.9 % 100 mL IVPB  Status:  Discontinued        04/09/20 0912 04/09/20 0913   04/09/20 0915  cefTRIAXone (ROCEPHIN) 1 g in sodium chloride 0.9 % 100 mL IVPB        04/09/20 0913 04/09/20 1041       Discharge Exam: Vitals:   04/14/20 0500 04/14/20 0502 04/14/20 0753 04/14/20 1118  BP: (!) 138/55  (!) 152/67 (!) 161/66  Pulse: (!) 59  (!) 59  60  Resp: 20  20 19   Temp:  97.6 F (36.4 C) 97.6 F (36.4 C) 98.3 F (36.8 C)  TempSrc:  Oral Oral Oral  SpO2: 94%  94% 95%  Weight:  78.8 kg    Height:        General: A/O x4, no acute respiratory distress Eyes: negative scleral hemorrhage, negative  anisocoria, negative icterus ENT: Negative Runny nose, negative gingival bleeding, Neck:  Negative scars, masses, torticollis, lymphadenopathy, JVD Lungs: Clear to auscultation bilaterally without wheezes or crackles Cardiovascular: Regular rate and rhythm without murmur gallop or rub normal S1 and S2   Discharge Instructions   Allergies as of 04/14/2020      Reactions   Albumin (human) Anaphylaxis   Polymyxin B-trimethoprim Swelling   Eye drops made eyes swell   Pseudoephedrine Other (See Comments)   Stomach cramps   Codeine Hives, Itching, Rash   Guaiacol Other (See Comments)   Hallucinations   Statins Other (See Comments)   Muscle cramps   Meloxicam Other (See Comments)   Unknown reaction   Pseudoephedrine-guaifenesin Nausea And Vomiting   Stomach cramps   Rosuvastatin Calcium Other (See Comments)   Muscle aches   Tapentadol Other (See Comments)   Unknown reaction   Ciprofloxacin Hives, Itching, Nausea Only, Rash   Moxifloxacin Nausea Only, Other (See Comments)   Headaches, stomach cramps   Rofecoxib Other (See Comments)   Stomach cramping      Medication List    STOP taking these medications   amoxicillin 500 MG capsule Commonly known as: AMOXIL   fluconazole 100 MG tablet Commonly known as: DIFLUCAN   traMADol 50 MG tablet Commonly known as: ULTRAM     TAKE these medications   albuterol 108 (90 Base) MCG/ACT inhaler Commonly known as: VENTOLIN HFA Inhale 1-2 puffs into the lungs every 6 (six) hours as needed for wheezing or shortness of breath.   brimonidine 0.2 % ophthalmic solution Commonly known as: ALPHAGAN Place 1 drop into the left eye in the morning and at bedtime.   clobetasol 0.05 % external solution Commonly known as: TEMOVATE APPLY 1 APPLICATION TOPICALLY TWICE DAILY What changed:   how much to take  how to take this  when to take this  reasons to take this  additional instructions   Eliquis 5 MG Tabs tablet Generic drug:  apixaban TAKE 1 TABLET BY MOUTH TWO  TIMES DAILY What changed: how much to take   furosemide 20 MG tablet Commonly known as: LASIX TAKE 1 TABLET BY MOUTH  DAILY   ICaps MV Tabs Take 1 tablet by mouth 2 (two) times daily.   latanoprost 0.005 % ophthalmic solution Commonly known as: XALATAN Place 1 drop into the left eye at bedtime.   LUCENTIS IO Inject 1 Dose into the eye as needed (Macular degeneration).   metoprolol tartrate 25 MG tablet Commonly known as: LOPRESSOR TAKE 1 TABLET BY MOUTH  TWICE DAILY   nitroGLYCERIN 0.4 MG SL tablet Commonly known as: NITROSTAT Place 1 tablet (0.4 mg total) under the tongue every 5 (five) minutes as needed for chest pain.   omeprazole 20 MG tablet Commonly known as: PRILOSEC OTC Take 20 mg by mouth daily.   ondansetron 4 MG tablet Commonly known as: ZOFRAN Take 1 tablet (4 mg total) by mouth every 6 (six) hours as needed for nausea.   potassium chloride 10 MEQ tablet Commonly known as: KLOR-CON TAKE 1 TABLET BY MOUTH  DAILY What changed: how much to take  propafenone 225 MG tablet Commonly known as: RYTHMOL TAKE 1 TABLET BY MOUTH TWO  TIMES DAILY What changed: when to take this   tamsulosin 0.4 MG Caps capsule Commonly known as: Flomax Take 1 capsule (0.4 mg total) by mouth daily.   Vitamin D3 125 MCG (5000 UT) Caps Take 5,000 Units by mouth every other day.      Allergies  Allergen Reactions  . Albumin (Human) Anaphylaxis  . Polymyxin B-Trimethoprim Swelling    Eye drops made eyes swell  . Pseudoephedrine Other (See Comments)    Stomach cramps  . Codeine Hives, Itching and Rash  . Guaiacol Other (See Comments)    Hallucinations  . Statins Other (See Comments)    Muscle cramps   . Meloxicam Other (See Comments)    Unknown reaction  . Pseudoephedrine-Guaifenesin Nausea And Vomiting    Stomach cramps  . Rosuvastatin Calcium Other (See Comments)    Muscle aches  . Tapentadol Other (See Comments)    Unknown  reaction  . Ciprofloxacin Hives, Itching, Nausea Only and Rash  . Moxifloxacin Nausea Only and Other (See Comments)    Headaches, stomach cramps   . Rofecoxib Other (See Comments)    Stomach cramping      The results of significant diagnostics from this hospitalization (including imaging, microbiology, ancillary and laboratory) are listed below for reference.    Significant Diagnostic Studies: CT ABDOMEN PELVIS W CONTRAST  Result Date: 04/09/2020 CLINICAL DATA:  Severe lower abdominal pain since last Friday with nausea, vomiting and diarrhea, suspected abdominal abscess/infection; history cholecystectomy, appendectomy, former smoker, hypertension, kidney stones, COPD, GERD, coronary artery disease, atrial fibrillation, alcohol abuse EXAM: CT ABDOMEN AND PELVIS WITH CONTRAST TECHNIQUE: Multidetector CT imaging of the abdomen and pelvis was performed using the standard protocol following bolus administration of intravenous contrast. Sagittal and coronal MPR images reconstructed from axial data set. Exam utilized mA and/or kV adjustment based on patient size in order to minimize patient radiation dose. CONTRAST:  137mL OMNIPAQUE IOHEXOL 300 MG/ML SOLN IV. No oral contrast. COMPARISON:  03/24/2018 FINDINGS: Lower chest: Emphysematous changes with chronic interstitial disease at the posterior lung bases. Hepatobiliary: Gallbladder surgically absent. No focal hepatic abnormalities Pancreas: Tiny calcification at pancreatic body. Pancreas otherwise normal appearance Spleen: Normal appearance Adrenals/Urinary Tract: Adrenal glands, kidneys, and ureters normal appearance. Foley catheter decompresses urinary bladder. Bladder wall appears thickened, may be related to artifact from underdistention but other causes of wall thickening not excluded. Stomach/Bowel: Appendix surgically absent by history. Sigmoid diverticulosis without evidence of diverticulitis. Large and small bowel loops otherwise unremarkable.  Marked diffuse gastric wall thickening, may reflect gastritis but infiltrative processes and tumor not excluded. Vascular/Lymphatic: Atherosclerotic calcifications aorta and iliac arteries as well as femoral in coronary arteries. Aorta normal caliber. No adenopathy. Reproductive: Mild prostatic enlargement gland measuring 4.4 x 3.8 cm image 76 Other: Small LEFT inguinal hernia containing fat. Tiny umbilical hernia containing fat. No free air or free fluid. Musculoskeletal: Osseous demineralization. IMPRESSION: Sigmoid diverticulosis without evidence of diverticulitis. Mild prostatic enlargement. Small LEFT inguinal and tiny umbilical hernias containing fat. Marked diffuse gastric wall thickening question gastritis though infiltrative processes and tumor are not excluded; follow-up endoscopy recommended to exclude tumor. 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 Aortic Atherosclerosis (ICD10-I70.0) and Emphysema (ICD10-J43.9). Electronically Signed   By: Lavonia Dana M.D.   On: 04/09/2020 13:03   DG CHEST PORT 1 VIEW  Result Date: 04/12/2020 CLINICAL DATA:  Pulmonary edema. EXAM: PORTABLE CHEST 1 VIEW COMPARISON:  Prior study same day. Chest x-ray 04/11/2020, 03/27/2017. FINDINGS: Mediastinum hilar structures normal. Stable cardiomegaly. No pulmonary venous congestion. Persistent bilateral predominantly basilar interstitial prominence consistent with interstitial edema and or pneumonitis. Similar findings noted on prior exam. Small right pleural effusion cannot be excluded. No pneumothorax. Prior cervical spine fusion. IMPRESSION: 1. Persistent bilateral predominantly basilar interstitial prominence consistent with interstitial edema and or pneumonitis. Similar findings noted on prior exam. Small right pleural effusion cannot be excluded. 2.  Stable cardiomegaly.  No pulmonary venous congestion. Electronically Signed   By: Marcello Moores  Register   On: 04/12/2020 06:24   DG Chest Port 1  View  Result Date: 04/12/2020 CLINICAL DATA:  Acute respiratory distress and inpatient EXAM: PORTABLE CHEST 1 VIEW COMPARISON:  Radiograph 04/11/2020 FINDINGS: Increasing basilar opacity predominantly in the right infrahilar lung on a background of more chronic fine reticular interstitial and emphysematous changes seen on comparison imaging. There is a new small right pleural effusion. No left effusion. No pneumothorax. Stable cardiomediastinal contours. No acute osseous or soft tissue abnormality. Prior cervical fusion. Telemetry leads overlie the chest. IMPRESSION: 1. Increasing basilar opacity predominantly in the right infrahilar lung with new right effusion, may reflect edema on a background of more chronic interstitial change. Atypical infection is possible though less favored in the absence of clinical symptoms. Electronically Signed   By: Lovena Le M.D.   On: 04/12/2020 00:22   DG CHEST PORT 1 VIEW  Result Date: 04/11/2020 CLINICAL DATA:  Chest pain. Abdominal pain. EXAM: PORTABLE CHEST 1 VIEW COMPARISON:  Chest radiograph and abdominal CT 04/09/2020 FINDINGS: Subpleural reticulation with questionable fibrotic change at the lung bases, similar to prior exam. Underlying emphysema. There are increasing interstitial and reticular opacities in the left greater than right mid and upper lung zones. Mild cardiomegaly with unchanged mediastinal contours. No pneumothorax or large pleural effusion. Surgical hardware in the lower cervical spine is partially included. No acute osseous abnormalities are seen. IMPRESSION: 1. Background emphysema and interstitial lung disease. There are increasing reticular and interstitial opacities in the upper lung zones over the past 2 days which may represent superimposed pulmonary edema or atypical infection. 2. Mild cardiomegaly. Electronically Signed   By: Keith Rake M.D.   On: 04/11/2020 15:17   DG Chest Portable 1 View  Result Date: 04/09/2020 CLINICAL DATA:   Chest pain EXAM: PORTABLE CHEST 1 VIEW COMPARISON:  March 22, 2018 FINDINGS: There is somewhat ill-defined airspace opacity in the right base region. Lungs elsewhere are clear. Heart is upper normal in size with pulmonary vascularity normal. No adenopathy. Postoperative change noted in the lower cervical region. IMPRESSION: Ill-defined opacity consistent with pneumonia right base. This area is somewhat less well-defined than on previous study, but there appears to be overall slight increase in opacity in this area compared to the previous study. Lungs elsewhere clear. Stable cardiac silhouette. No adenopathy. Followup PA and lateral chest radiographs recommended in 3-4 weeks following trial of antibiotic therapy to ensure resolution and exclude underlying malignancy. Electronically Signed   By: Lowella Grip III M.D.   On: 04/09/2020 12:11    Microbiology: Recent Results (from the past 240 hour(s))  Urine culture     Status: None   Collection Time: 04/08/20  5:41 AM   Specimen: Urine, Clean Catch  Result Value Ref Range Status   Specimen Description   Final    URINE, CLEAN CATCH Performed at [facility], [place] [address] [place], [place] [address]    Special Requests  Final    NONE Performed at [facility], [place] [address] [place], [place] [address]    Culture   Final    NO GROWTH Performed at [facility], [place] [address], [place], [place] [address]    Report Status 04/09/2020 FINAL  Final  Blood culture (routine x 2)     Status: None (Preliminary result)   Collection Time: 04/09/20  9:51 AM   Specimen: BLOOD  Result Value Ref Range Status   Specimen Description BLOOD RIGHT ANTECUBITAL  Final   Special Requests   Final    BOTTLES DRAWN AEROBIC AND ANAEROBIC Blood Culture adequate volume   Culture   Final    NO GROWTH 4 DAYS Performed at [facility], [place] [address], [place], [place] [address]    Report Status PENDING  Incomplete   Blood culture (routine x 2)     Status: None (Preliminary result)   Collection Time: 04/09/20  9:51 AM   Specimen: BLOOD  Result Value Ref Range Status   Specimen Description BLOOD LEFT ANTECUBITAL  Final   Special Requests   Final    BOTTLES DRAWN AEROBIC AND ANAEROBIC Blood Culture results may not be optimal due to an inadequate volume of blood received in culture bottles   Culture   Final    NO GROWTH 4 DAYS Performed at [facility], [place] [address] [place], [place] [address]    Report Status PENDING  Incomplete  Urine culture     Status: None   Collection Time: 04/09/20 10:25 AM   Specimen: Urine, Random  Result Value Ref Range Status   Specimen Description URINE, RANDOM  Final   Special Requests NONE  Final   Culture   Final    NO GROWTH Performed at [facility], [place] [address] [place],  [address]    Report Status 04/10/2020 FINAL  Final  SARS Coronavirus 2 by RT PCR (hospital order, performed in [facility] hospital lab) Nasopharyngeal Nasopharyngeal Swab     Status: None   Collection Time: 04/09/20 12:40 PM   Specimen: Nasopharyngeal Swab  Result Value Ref Range Status   SARS Coronavirus 2 NEGATIVE NEGATIVE Final    Comment: (NOTE) SARS-CoV-2 target nucleic acids are NOT DETECTED.  The SARS-CoV-2 RNA is generally detectable in upper and lower respiratory specimens during the acute phase of infection. The lowest concentration of SARS-CoV-2 viral copies this assay can detect is 250 copies / mL. A negative result does not preclude SARS-CoV-2 infection and should not be used as the sole basis for treatment or other patient management decisions.  A negative result may occur with improper specimen collection / handling, submission of specimen other than nasopharyngeal swab, presence of viral mutation(s) within the areas targeted by this assay, and inadequate number of viral copies (<250 copies / mL). A negative result must be combined with  clinical observations, patient history, and epidemiological information.  Fact Sheet for Patients:   StrictlyIdeas.no  Fact Sheet for Healthcare Providers: BankingDealers.co.za  This test is not yet approved or  cleared by the [place] FDA and has been authorized for detection and/or diagnosis of SARS-CoV-2 by FDA under an Emergency Use Authorization (EUA).  This EUA will remain in effect (meaning this test can be used) for the duration of the COVID-19 declaration under Section 564(b)(1) of the Act, 21 U.S.C. section 360bbb-3(b)(1), unless the authorization is terminated or revoked sooner.  Performed at [facility], [address], [place], [place] [address]   MRSA PCR Screening     Status: None   Collection Time: 04/09/20  9:22 PM   Specimen: Nasal Mucosa; Nasopharyngeal  Result Value Ref Range Status   MRSA by PCR NEGATIVE NEGATIVE Final    Comment:        The GeneXpert MRSA Assay (FDA approved for NASAL specimens only), is one component of a comprehensive MRSA colonization surveillance program. It is not intended to diagnose MRSA infection nor to guide or monitor treatment for MRSA infections. Performed at [facility], [place] [address] [place], [place] [address]   Respiratory Panel by PCR     Status: None   Collection Time: 04/12/20  9:51 AM   Specimen: Respiratory  Result Value Ref Range Status   Adenovirus NOT DETECTED NOT DETECTED Final   Coronavirus 229E NOT DETECTED NOT DETECTED Final    Comment: (NOTE) The Coronavirus on the Respiratory Panel, DOES NOT test for the novel  Coronavirus (2019 nCoV)    Coronavirus HKU1 NOT DETECTED NOT DETECTED Final   Coronavirus NL63 NOT DETECTED NOT DETECTED Final   Coronavirus OC43 NOT DETECTED NOT DETECTED Final   Metapneumovirus NOT DETECTED NOT DETECTED Final   Rhinovirus / Enterovirus NOT DETECTED NOT DETECTED Final   Influenza A NOT DETECTED NOT DETECTED  Final   Influenza B NOT DETECTED NOT DETECTED Final   Parainfluenza Virus 1 NOT DETECTED NOT DETECTED Final   Parainfluenza Virus 2 NOT DETECTED NOT DETECTED Final   Parainfluenza Virus 3 NOT DETECTED NOT DETECTED Final   Parainfluenza Virus 4 NOT DETECTED NOT DETECTED Final   Respiratory Syncytial Virus NOT DETECTED NOT DETECTED Final   Bordetella pertussis NOT DETECTED NOT DETECTED Final   Chlamydophila pneumoniae NOT DETECTED NOT DETECTED Final   Mycoplasma pneumoniae NOT DETECTED NOT DETECTED Final    Comment: Performed at [facility], [place]. [address] [place],  [address]     Labs: Basic Metabolic Panel: Recent Labs  Lab 04/11/20 0225 04/11/20 1517 04/12/20 0314 04/13/20 0237 04/14/20 0140  NA 135 135 134* 136 137  K 3.4* 4.4 3.9 3.7 3.7  CL 106 106 102 103 104  CO2 21* 21* 21* 21* 23  GLUCOSE 102* 104* 120* 133* 94  BUN 7* 8 8 24* 23  CREATININE 1.09 0.99 1.21 1.36* 1.05  CALCIUM 7.5* 7.9* 7.9* 8.4* 8.7*  MG 1.8 2.3 1.7 1.7 1.8  PHOS 2.4* 2.3* 2.2* 1.9* 2.1*   Liver Function Tests: Recent Labs  Lab 04/10/20 0930 04/11/20 0225 04/12/20 0314 04/13/20 0237 04/14/20 0140  AST 21 17 19 19 19   ALT 13 12 14 15 18   ALKPHOS 44 46 49 49 47  BILITOT 0.8 1.3* 1.7* 0.9 0.8  PROT 5.8* 5.4* 6.2* 6.3* 5.8*  ALBUMIN 2.7* 2.5* 3.0* 3.0* 2.8*   Recent Labs  Lab 04/09/20 0950  LIPASE 29   No results for input(s): AMMONIA in the last 168 hours. CBC: Recent Labs  Lab 04/09/20 0950 04/09/20 0950 04/10/20 0315 04/11/20 0225 04/12/20 0314 04/13/20 0237 04/14/20 0140  WBC 9.4   < > 7.7 7.7 12.0* 13.5* 12.0*  NEUTROABS 6.8  --   --  4.8 9.9* 11.5* 7.4  HGB 12.3*   < > 10.7* 9.8* 11.5* 11.5* 10.8*  HCT 38.1*   < > 31.4* 29.1* 33.6* 32.8* 31.9*  MCV 93.6   < > 89.0 89.8 89.1 88.4 89.9  PLT 237   < > 218 208 242 266  277   < > = values in this interval not displayed.   Cardiac Enzymes: No results for input(s): CKTOTAL, CKMB, CKMBINDEX, TROPONINI in the last  168 hours. BNP: BNP (last 3 results) No results for input(s): BNP in the last 8760 hours.  ProBNP (last 3 results) No results for input(s): PROBNP in the last 8760 hours.  CBG: Recent Labs  Lab 04/09/20 0857  GLUCAP 100*       Signed:  Dia Crawford, MD [facility] [phone] pager

## 2020-04-14 NOTE — Evaluation (Signed)
Occupational Therapy Evaluation Patient Details Name: Ryan [name] MRN: [id number] DOB: 1946-06-09 Today's Date: 04/14/2020    History of Present Illness 74 y.o.WM PMHx HTN, HLD, dilated cardiomyopathy, atrial fibrillation/atrial flutteron Eliquis,anemia,andnephrolithiasis.  Does not use O2 at home; Presented 04/09/20 with complaints of nausea, vomiting, and diarrhea over the last 4 days leading up to this admission; recent difficulty with urinary retention as well.  Dx:  gastritis.  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     Clinical Impression   Pt admitted with above. He demonstrates the below listed deficits and will benefit from continued OT to maximize safety and independence with BADLs.  Pt presents to OT with decreased activity tolerance, generalized weakness, and impaired balance.  He currently requires min guard assist with ADLs  With DOE 2/4 - 3/4 and 02 sats >93% on RA with all activity.  Long discussion with pt re: appropriate activity level, pacing himself, and strategies for fall prevention.  He is very eager to discharge home.  He will not require follow up OT at discharge, but acute OT will follow him while in house.        Follow Up Recommendations  No OT follow up;Supervision/Assistance - 24 hour (initially )    Equipment Recommendations  None recommended by OT    Recommendations for Other Services       Precautions / Restrictions Precautions Precautions: Fall Restrictions Weight Bearing Restrictions: No      Mobility Bed Mobility Overal bed mobility: Needs Assistance Bed Mobility: Supine to Sit;Sit to Supine     Supine to sit: Supervision Sit to supine: Min assist   General bed mobility comments: assist to lift LEs onto bed.  Heavy use of bed rails.  Wife able to assist   Transfers Overall transfer level: Needs assistance Equipment used: Rolling walker (2 wheeled);None;1 person hand held  assist Transfers: Sit to/from [facility] Sit to Stand: Min guard Stand pivot transfers: Min guard       General transfer comment: min guard assist for safety and balancew     Balance Overall balance assessment: Needs assistance Sitting-balance support: Feet supported Sitting balance-Leahy Scale: Good     Standing balance support: During functional activity;No upper extremity supported Standing balance-Leahy Scale: Fair Standing balance comment: able to maintain static standing with min guard - supervision                            ADL either performed or assessed with clinical judgement   ADL Overall ADL's : Needs assistance/impaired Eating/Feeding: Independent   Grooming: Wash/dry hands;Wash/dry face;Oral care;Brushing hair;Supervision/safety;Standing   Upper Body Bathing: Set up;Supervision/ safety;Sitting   Lower Body Bathing: Min guard;Sit to/from stand   Upper Body Dressing : Set up;Sitting   Lower Body Dressing: Min guard;Sit to/from stand   Toilet Transfer: Min guard;Ambulation;Comfort height toilet;Grab bars   Toileting- Clothing Manipulation and Hygiene: Min guard;Sit to/from stand       Functional mobility during ADLs: Min guard General ADL Comments: Pt mildly unsteady.  02 sats >94% with ADLs      Vision Baseline Vision/History: Wears glasses Wears Glasses: At all times Patient Visual Report: No change from baseline Vision Assessment?: No apparent visual deficits     Perception     Praxis      Pertinent Vitals/Pain Pain Assessment: Faces Faces Pain Scale: Hurts a little bit Pain Location: generalized  Pain Descriptors /  Indicators: Grimacing Pain Intervention(s): Monitored during session     Hand Dominance Right   Extremity/Trunk Assessment Upper Extremity Assessment Upper Extremity Assessment: Generalized weakness   Lower Extremity Assessment Lower Extremity Assessment: Defer to PT evaluation   Cervical /  Trunk Assessment Cervical / Trunk Assessment: Normal   Communication Communication Communication: No difficulties   Cognition Arousal/Alertness: Awake/alert Behavior During Therapy: WFL for tasks assessed/performed Overall Cognitive Status: Within Functional Limits for tasks assessed                                     General Comments  Pt very motivated to discharge home.  Pt ambulated in hallway with OT, and was unsteady requiring min A as he fatigued.  02 sats remained >93% on RA.  Long discussion with he and wife about appropriate activity level at home, need to pace self, and need to use AD for ambulation (PT will make final recommendation).  After a long rest break, he requested a second walk, and was able to ambulate ~300' with RW and min guard assist wtih DOE 2/4.  Instructed him to sit to shower and to avoid weight resisted exercise and cardio machines until directed by [facility] therapies (has a home gym).  Also advised him against riding motorcycles, working on [employer], or riding motrocycles at this time.   He and wife verbalized understanding     Exercises     Shoulder Instructions      Home Living Family/patient expects to be discharged to:: Private residence Living Arrangements: Spouse/significant other Available Help at Discharge: Family;Available 24 hours/day Type of Home: House Home Access: Stairs to enter [employer] of Steps: 2 Entrance Stairs-Rails: Right Home Layout: Multi-level;Able to live on main level with bedroom/bathroom;Laundry or work area in Building surveyor of Steps: full flights    Bathroom Shower/Tub: Occupational psychologist: Handicapped height Bathroom Accessibility: Yes How Accessible: Accessible via walker Home Equipment: Environmental consultant - 2 wheels;Walker - standard;Cane - single point;Cane - quad;Bedside commode;Shower seat - built in;Grab bars - tub/shower   Additional Comments: lives with  wife who can assist as needed.  Sister in law will also be able to assist as needed       Prior Functioning/Environment Level of Independence: Independent        Comments: Pt is a retired Corporate treasurer for [employer].  He enjoys riding motorcycles as well as Therapist, nutritional.  He has a work shop at his home, and stays very active         OT Problem List: Impaired balance (sitting and/or standing);Decreased activity tolerance;Decreased strength;Cardiopulmonary status limiting activity      OT Treatment/Interventions: Self-care/ADL training;Therapeutic exercise;Energy conservation;DME and/or AE instruction;Therapeutic activities;Balance training;Patient/family education    OT Goals(Current goals can be found in the care plan section) Acute Rehab OT Goals Patient Stated Goal: "Please tell Dr Sherral Hammers I can go home today" OT Goal Formulation: With patient/family Time For Goal Achievement: 04/28/20 Potential to Achieve Goals: Good ADL Goals Pt Will Perform Grooming: with modified independence;standing Pt Will Perform Upper Body Bathing: with modified independence;standing Pt Will Perform Lower Body Bathing: with modified independence;sit to/from stand Pt Will Perform Upper Body Dressing: with modified independence;sitting Pt Will Perform Lower Body Dressing: with modified independence;sit to/from stand Pt Will Transfer to Toilet: with modified independence;ambulating;regular height toilet Pt Will Perform Toileting - Clothing Manipulation and hygiene: with  modified independence;sit to/from stand  OT Frequency: Min 2X/week   Barriers to D/C:            Co-evaluation              AM-PAC OT "6 Clicks" Daily Activity     Outcome Measure Help from another person eating meals?: None Help from another person taking care of personal grooming?: A Little Help from another person toileting, which includes using toliet, bedpan, or urinal?: A Little Help from another person  bathing (including washing, rinsing, drying)?: A Little Help from another person to put on and taking off regular upper body clothing?: A Little Help from another person to put on and taking off regular lower body clothing?: A Little 6 Click Score: 19   End of Session Equipment Utilized During Treatment: Rolling walker Nurse Communication: Mobility status  Activity Tolerance: Patient tolerated treatment well Patient left: in bed;with call bell/phone within reach;with family/visitor present  OT Visit Diagnosis: Unsteadiness on feet (R26.81)                Time: 9021-1155 OT Time Calculation (min): 54 min Charges:  OT General Charges $OT Visit: 1 Visit OT Evaluation $OT Eval Moderate Complexity: 1 Mod OT Treatments $Self Care/Home Management : 23-37 mins $Therapeutic Activity: 8-22 mins  Nilsa Nutting., OTR/L Acute Rehabilitation Services Pager [phone] Office [phone]   Lucille Passy M 04/14/2020, 10:57 AM

## 2020-04-15 ENCOUNTER — Other Ambulatory Visit: Payer: Self-pay | Admitting: Family Medicine

## 2020-04-15 ENCOUNTER — Telehealth: Payer: Self-pay | Admitting: Family Medicine

## 2020-04-15 NOTE — Telephone Encounter (Signed)
Spoke with wife and she stated that patient was discharged from ER and they stated that they were going to send in medications for patient and never did.  I checked with wife to see if they maybe gave them to her and she just overlooked them, since rxs in systems say they were printed.  She stated that they did not receive any rxs.  Is there any way we can send in for patient?  I called ER and they sent me to an voicemail and patient needs rxs now.

## 2020-04-15 NOTE — Telephone Encounter (Signed)
I called to figure out what they need- their post- hospital rx were dispensed as intended, but they note Ryan [name] was getting ativan inpt and they would like to continue this outpt as well if possible due to anxiety.  As this would be a new controlled substance rx, I let the family know that I will pass message along to his PCP Dr Charlett Blake to make this decision.    They state understanding and agreement  JC

## 2020-04-15 NOTE — Telephone Encounter (Signed)
I am willing to consider a short course of Ativan. If they want to discuss options I can do a virtual visit on Wednesday morning or Friday afternoon.

## 2020-04-15 NOTE — Telephone Encounter (Signed)
Lab orders in from last visit.  He can just make a lab only visit.    Left message on machine to call back to schedule.

## 2020-04-15 NOTE — Telephone Encounter (Signed)
Patient calling in regards to medication on his d/c from hospital. Patient states no medication sent from his Pharmacy. Patient d/c of 04/14/2020

## 2020-04-15 NOTE — Telephone Encounter (Signed)
Patient states he would like Ac-1 check. Pt is requesting Provider order.  Please advise

## 2020-04-16 NOTE — Telephone Encounter (Signed)
Patient scheduled for tomorrow

## 2020-04-17 ENCOUNTER — Other Ambulatory Visit: Payer: Self-pay

## 2020-04-17 ENCOUNTER — Ambulatory Visit (HOSPITAL_COMMUNITY): Payer: Medicare Other | Attending: Cardiology

## 2020-04-17 ENCOUNTER — Telehealth (INDEPENDENT_AMBULATORY_CARE_PROVIDER_SITE_OTHER): Payer: Medicare Other | Admitting: Family Medicine

## 2020-04-17 VITALS — BP 130/81 | HR 68 | Wt 173.4 lb

## 2020-04-17 DIAGNOSIS — F419 Anxiety disorder, unspecified: Secondary | ICD-10-CM

## 2020-04-17 DIAGNOSIS — I10 Essential (primary) hypertension: Secondary | ICD-10-CM

## 2020-04-17 DIAGNOSIS — R7989 Other specified abnormal findings of blood chemistry: Secondary | ICD-10-CM

## 2020-04-17 DIAGNOSIS — R0600 Dyspnea, unspecified: Secondary | ICD-10-CM | POA: Diagnosis not present

## 2020-04-17 DIAGNOSIS — K219 Gastro-esophageal reflux disease without esophagitis: Secondary | ICD-10-CM

## 2020-04-17 DIAGNOSIS — K2900 Acute gastritis without bleeding: Secondary | ICD-10-CM

## 2020-04-17 DIAGNOSIS — R739 Hyperglycemia, unspecified: Secondary | ICD-10-CM | POA: Diagnosis not present

## 2020-04-17 LAB — ECHOCARDIOGRAM COMPLETE: Weight: 2774.4 oz

## 2020-04-17 MED ORDER — LORAZEPAM 0.5 MG PO TABS
0.5000 mg | ORAL_TABLET | Freq: Two times a day (BID) | ORAL | 0 refills | Status: DC | PRN
Start: 2020-04-17 — End: 2020-06-11

## 2020-04-20 DIAGNOSIS — F419 Anxiety disorder, unspecified: Secondary | ICD-10-CM | POA: Insufficient documentation

## 2020-04-20 DIAGNOSIS — R0600 Dyspnea, unspecified: Secondary | ICD-10-CM | POA: Insufficient documentation

## 2020-04-20 NOTE — Assessment & Plan Note (Signed)
Supplement and monitor 

## 2020-04-20 NOTE — Assessment & Plan Note (Signed)
minimize simple carbs. Increase exercise as tolerated.  

## 2020-04-20 NOTE — Assessment & Plan Note (Signed)
Monitor and report any concerns, no changes to meds. Encouraged heart healthy diet such as the DASH diet and exercise as tolerated.  ?

## 2020-04-20 NOTE — Progress Notes (Signed)
Virtual Visit via Video Note  I connected with Ryan [name] on 04/17/20 at  9:00 AM EDT by a video enabled telemedicine application and verified that I am speaking with the correct person using two identifiers.  Location: Patient: home, patient and wife and provider were in visit Provider: home   I discussed the limitations of evaluation and management by telemedicine and the availability of in person appointments. The patient expressed understanding and agreed to proceed. Ryan [name], CMA was able to get the patient set up on a video visit.     Subjective:    Patient ID: Ryan [name], male    DOB: [birth year], 74 y.o.   MRN: [id number]  Chief Complaint  Patient presents with  . discuss ativan  . discuss hospital visit    cardiomyopathy    HPI Patient is in today for follow up on recent hospital stay. He presented to the ER twice in a week  He initially had a bad gastoenteritis with nausea, vomiting and diarrhea resulting in dehydration and urinary retention. Those symptoms improved but he had to present to the ER a few days later with dyspnea and was found to have elevated troponin levels as well as pleural effusion and more. He feels better since returning home but he still notes dyspnea. Denies CP/palp/HA/congestion/fevers or GU c/o. Taking meds as prescribed  Past Medical History:  Diagnosis Date  . A-fib ([place]) 03/22/2018  . Anemia   . Atrial flutter ([place]) 03/31/2012   converted spontaneously to sinus  . CAD (coronary artery disease)    reportedly moderate CAD; managed medically  . COPD (chronic obstructive pulmonary disease) ([place])   . Eczema 08/03/2014  . Edema 03/22/2018  . GERD (gastroesophageal reflux disease)   . H/O: rheumatic fever   . Heart murmur   . Heart murmur   . High risk medication use    on amiodarone since 03/31/2012  . History of colonoscopy   . Hyperglycemia 06/21/2017  . Hyperlipidemia   . Hypertension   . Kidney stone 06/21/2017  . Kidney  stones   . Lobar pneumonia ([place]) 03/22/2018  . Low vitamin D level 06/21/2017  . Macular degeneration   . Osteoporosis 05/31/2016    Past Surgical History:  Procedure Laterality Date  . APPENDECTOMY    . CARDIAC CATHETERIZATION  04/22/2011   moderate left main and RCA stenosis not significant by FFR and IVUS on medical therapy  . CARDIOVERSION N/A 04/25/2018   Procedure: CARDIOVERSION;  Surgeon: Skeet Latch, MD;  Location: [facility] ENDOSCOPY;  Service: Cardiovascular;  Laterality: N/A;  . CERVICAL DISCECTOMY     C5,6,7 disc fused  with plate and 5 1" screws  . CHOLECYSTECTOMY    . colon polyp removal  3/1.19  . FOOT SURGERY     right calcification removed from top of foot  . knee cartiledge Right    right knee  . MOUTH SURGERY     periodontal surgery, bridges, splint in front,     Family History  Problem Relation Age of Onset  . Heart disease Mother   . Diabetes Mother   . Cirrhosis Mother   . Emphysema Mother        never smoked but 2nd hand through her spouse  . Hypertension Mother   . Macular degeneration Mother   . Heart disease Father   . Cancer Father        prostate  . Hyperlipidemia Father   . Hypertension Father   . Varicose Veins  Father   . Heart attack Father   . Peripheral vascular disease Father   . Heart disease Sister   . Arthritis Sister   . Hyperlipidemia Sister   . Obesity Sister   . Macular degeneration Sister   . Heart disease Brother        5 stents  . Hyperlipidemia Brother   . Macular degeneration Maternal Grandfather   . Cirrhosis Sister   . Obesity Sister   . Arthritis Sister   . Heart disease Sister   . Obesity Sister   . Liver disease Other   . Prostate cancer Other   . Coronary artery disease Other     Social History   Socioeconomic History  . Marital status: Married    Spouse name: Not on file  . Number of children: 0  . Years of education: Not on file  . Highest education level: Not on file  Occupational History  .  Occupation: retired    Fish farm manager: RETIRED  Tobacco Use  . Smoking status: Former Smoker    Packs/day: 0.50    Years: 50.00    Pack years: 25.00    Types: Cigarettes    Quit date: 05/01/2013    Years since quitting: 6.9  . Smokeless tobacco: Former Systems developer  . Tobacco comment: using e-[employer]ig//ldc  Vaping Use  . Vaping Use: Never used  Substance and Sexual Activity  . Alcohol use: No  . Drug use: No  . Sexual activity: Yes    Comment: lives with wife, no dietary restrictions  Other Topics Concern  . Not on file  Social History Narrative  . Not on file   Social Determinants of Health   Financial Resource Strain:   . Difficulty of Paying Living Expenses:   Food Insecurity:   . Worried About Charity fundraiser in the Last Year:   . Arboriculturist in the Last Year:   Transportation Needs:   . Film/video editor (Medical):   Marland Kitchen Lack of Transportation (Non-Medical):   Physical Activity:   . Days of Exercise per Week:   . Minutes of Exercise per Session:   Stress:   . Feeling of Stress :   Social Connections:   . Frequency of Communication with Friends and Family:   . Frequency of Social Gatherings with Friends and Family:   . Attends Religious Services:   . Active Member of Clubs or Organizations:   . Attends Archivist Meetings:   Marland Kitchen Marital Status:   Intimate Partner Violence:   . Fear of Current or Ex-Partner:   . Emotionally Abused:   Marland Kitchen Physically Abused:   . Sexually Abused:     Outpatient Medications Prior to Visit  Medication Sig Dispense Refill  . brimonidine (ALPHAGAN) 0.2 % ophthalmic solution Place 1 drop into the left eye in the morning and at bedtime.   7  . Cholecalciferol (VITAMIN D3) 5000 units CAPS Take 5,000 Units by mouth every other day.     . clobetasol (TEMOVATE) 0.05 % external solution APPLY 1 APPLICATION TOPICALLY TWICE DAILY (Patient taking differently: Apply 1 application topically 2 (two) times daily as needed (psoriasis). ) 100 mL 3    . ELIQUIS 5 MG TABS tablet TAKE 1 TABLET BY MOUTH TWO  TIMES DAILY (Patient taking differently: Take 5 mg by mouth 2 (two) times daily. ) 180 tablet 3  . furosemide (LASIX) 20 MG tablet TAKE 1 TABLET BY MOUTH  DAILY (Patient taking differently: Take 20  mg by mouth daily. ) 90 tablet 3  . latanoprost (XALATAN) 0.005 % ophthalmic solution Place 1 drop into the left eye at bedtime.     . levalbuterol (XOPENEX HFA) 45 MCG/ACT inhaler Inhale 2 puffs into the lungs every 4 (four) hours as needed for wheezing. 1 Inhaler 0  . metoprolol tartrate (LOPRESSOR) 25 MG tablet TAKE 1 TABLET BY MOUTH  TWICE DAILY (Patient taking differently: Take 25 mg by mouth 2 (two) times daily. ) 180 tablet 3  . Multiple Vitamins-Minerals (ICAPS MV) TABS Take 1 tablet by mouth 2 (two) times daily.     . nitroGLYCERIN (NITROSTAT) 0.4 MG SL tablet Place 1 tablet (0.4 mg total) under the tongue every 5 (five) minutes as needed for chest pain. 30 tablet 0  . omeprazole (PRILOSEC OTC) 20 MG tablet Take 20 mg by mouth daily.    . ondansetron (ZOFRAN) 4 MG tablet Take 1 tablet (4 mg total) by mouth every 6 (six) hours as needed for nausea. 20 tablet 0  . potassium chloride (KLOR-CON) 10 MEQ tablet TAKE 1 TABLET BY MOUTH  DAILY (Patient taking differently: Take 10 mEq by mouth daily. ) 90 tablet 3  . propafenone (RYTHMOL) 225 MG tablet TAKE 1 TABLET BY MOUTH TWO  TIMES DAILY (Patient taking differently: Take 225 mg by mouth in the morning and at bedtime. ) 180 tablet 2  . Ranibizumab (LUCENTIS IO) Inject 1 Dose into the eye as needed (Macular degeneration).     . tamsulosin (FLOMAX) 0.4 MG CAPS capsule Take 1 capsule (0.4 mg total) by mouth daily. 30 capsule 0   No facility-administered medications prior to visit.    Allergies  Allergen Reactions  . Albumin (Human) Anaphylaxis  . Polymyxin B-Trimethoprim Swelling    Eye drops made eyes swell  . Pseudoephedrine Other (See Comments)    Stomach cramps  . Codeine Hives, Itching  and Rash  . Guaiacol Other (See Comments)    Hallucinations  . Statins Other (See Comments)    Muscle cramps   . Meloxicam Other (See Comments)    Unknown reaction  . Pseudoephedrine-Guaifenesin Nausea And Vomiting    Stomach cramps  . Rosuvastatin Calcium Other (See Comments)    Muscle aches  . Tapentadol Other (See Comments)    Unknown reaction  . Ciprofloxacin Hives, Itching, Nausea Only and Rash  . Moxifloxacin Nausea Only and Other (See Comments)    Headaches, stomach cramps   . Rofecoxib Other (See Comments)    Stomach cramping    Review of Systems  Constitutional: Positive for malaise/fatigue. Negative for fever.  HENT: Negative for congestion.   Eyes: Negative for blurred vision.  Respiratory: Positive for shortness of breath.   Cardiovascular: Negative for chest pain, palpitations and leg swelling.  Gastrointestinal: Negative for abdominal pain, blood in stool and nausea.  Genitourinary: Negative for dysuria and frequency.  Musculoskeletal: Negative for falls.  Skin: Negative for rash.  Neurological: Negative for dizziness, loss of consciousness and headaches.  Endo/Heme/Allergies: Negative for environmental allergies.  Psychiatric/Behavioral: Negative for depression. The patient is nervous/anxious.        Objective:    Physical Exam Constitutional:      Appearance: Normal appearance. He is not ill-appearing.  HENT:     Head: Normocephalic and atraumatic.     Nose: Nose normal.  Eyes:     General:        Right eye: No discharge.        Left eye: No discharge.  Pulmonary:     Effort: Pulmonary effort is normal.  Neurological:     Mental Status: He is alert and oriented to person, place, and time.  Psychiatric:        Behavior: Behavior normal.     BP 130/81   Pulse 68   Wt 173 lb 6.4 oz (78.7 kg)   SpO2 95%   BMI 29.76 kg/m  Wt Readings from Last 3 Encounters:  04/17/20 173 lb 6.4 oz (78.7 kg)  04/14/20 173 lb 12.8 oz (78.8 kg)  03/11/20 181  lb (82.1 kg)    Diabetic Foot Exam - Simple   No data filed     Lab Results  Component Value Date   WBC 12.0 (H) 04/14/2020   HGB 10.8 (L) 04/14/2020   HCT 31.9 (L) 04/14/2020   PLT 277 04/14/2020   GLUCOSE 94 04/14/2020   CHOL 208 (H) 04/09/2020   TRIG 80 04/09/2020   HDL 25 (L) 04/09/2020   LDLDIRECT 134.2 12/01/2013   LDLCALC 167 (H) 04/09/2020   ALT 18 04/14/2020   AST 19 04/14/2020   NA 137 04/14/2020   K 3.7 04/14/2020   CL 104 04/14/2020   CREATININE 1.05 04/14/2020   BUN 23 04/14/2020   CO2 23 04/14/2020   TSH 2.37 09/14/2019   PSA 1.64 01/14/2015   INR 1.3 (H) 04/09/2020   HGBA1C 4.9 09/14/2019    Lab Results  Component Value Date   TSH 2.37 09/14/2019   Lab Results  Component Value Date   WBC 12.0 (H) 04/14/2020   HGB 10.8 (L) 04/14/2020   HCT 31.9 (L) 04/14/2020   MCV 89.9 04/14/2020   PLT 277 04/14/2020   Lab Results  Component Value Date   NA 137 04/14/2020   K 3.7 04/14/2020   CO2 23 04/14/2020   GLUCOSE 94 04/14/2020   BUN 23 04/14/2020   CREATININE 1.05 04/14/2020   BILITOT 0.8 04/14/2020   ALKPHOS 47 04/14/2020   AST 19 04/14/2020   ALT 18 04/14/2020   PROT 5.8 (L) 04/14/2020   ALBUMIN 2.8 (L) 04/14/2020   CALCIUM 8.7 (L) 04/14/2020   ANIONGAP 10 04/14/2020   GFR 59.36 (L) 12/19/2019   Lab Results  Component Value Date   CHOL 208 (H) 04/09/2020   Lab Results  Component Value Date   HDL 25 (L) 04/09/2020   Lab Results  Component Value Date   LDLCALC 167 (H) 04/09/2020   Lab Results  Component Value Date   TRIG 80 04/09/2020   Lab Results  Component Value Date   CHOLHDL 8.3 04/09/2020   Lab Results  Component Value Date   HGBA1C 4.9 09/14/2019       Assessment & Plan:   Problem List Items Addressed This Visit    Essential hypertension    Monitor and report any concerns, no changes to meds. Encouraged heart healthy diet such as the DASH diet and exercise as tolerated.       Relevant Orders   Comprehensive  metabolic panel   CBC w/Diff   GERD   Low vitamin D level    Supplement and monitor      Hyperglycemia    minimize simple carbs. Increase exercise as tolerated.       Gastritis    Resolved and feeling better in this regard.      Dyspnea - Primary    Order repeat CXR to evaluate resolution of effusion and interstitial edema. Also ordered repat echo as it has been 2  years      Relevant Orders   ECHOCARDIOGRAM COMPLETE (Completed)   DG Chest 2 View   Anxiety    Given a prescription for Lorazepam to use sparingly       Relevant Medications   LORazepam (ATIVAN) 0.5 MG tablet      I am having Ryan L. Carlota Raspberry "L" start on LORazepam. I am also having him maintain his ICaps MV, omeprazole, Ranibizumab (LUCENTIS IO), latanoprost, clobetasol, tamsulosin, brimonidine, Vitamin D3, Eliquis, metoprolol tartrate, furosemide, potassium chloride, propafenone, nitroGLYCERIN, ondansetron, and levalbuterol.  Meds ordered this encounter  Medications  . LORazepam (ATIVAN) 0.5 MG tablet    Sig: Take 1 tablet (0.5 mg total) by mouth 2 (two) times daily as needed for anxiety.    Dispense:  30 tablet    Refill:  0    I discussed the assessment and treatment plan with the patient. The patient was provided an opportunity to ask questions and all were answered. The patient agreed with the plan and demonstrated an understanding of the instructions.   The patient was advised to call back or seek an in-person evaluation if the symptoms worsen or if the condition fails to improve as anticipated.  I provided 30 minutes of non-face-to-face time during this encounter.   Penni Homans, MD

## 2020-04-20 NOTE — Assessment & Plan Note (Signed)
Given a prescription for Lorazepam to use sparingly

## 2020-04-20 NOTE — Assessment & Plan Note (Signed)
Order repeat CXR to evaluate resolution of effusion and interstitial edema. Also ordered repat echo as it has been 2 years

## 2020-04-20 NOTE — Assessment & Plan Note (Signed)
Resolved and feeling better in this regard.

## 2020-04-22 ENCOUNTER — Other Ambulatory Visit (INDEPENDENT_AMBULATORY_CARE_PROVIDER_SITE_OTHER): Payer: Medicare Other

## 2020-04-22 ENCOUNTER — Ambulatory Visit (HOSPITAL_BASED_OUTPATIENT_CLINIC_OR_DEPARTMENT_OTHER)
Admission: RE | Admit: 2020-04-22 | Discharge: 2020-04-22 | Disposition: A | Discharge status: Home / Self Care | Payer: Medicare Other | Source: Ambulatory Visit | Attending: Family Medicine | Admitting: Family Medicine

## 2020-04-22 ENCOUNTER — Other Ambulatory Visit: Payer: Self-pay

## 2020-04-22 DIAGNOSIS — I10 Essential (primary) hypertension: Secondary | ICD-10-CM | POA: Diagnosis not present

## 2020-04-22 DIAGNOSIS — R0602 Shortness of breath: Secondary | ICD-10-CM | POA: Diagnosis not present

## 2020-04-22 DIAGNOSIS — R0600 Dyspnea, unspecified: Secondary | ICD-10-CM | POA: Insufficient documentation

## 2020-04-22 LAB — CBC WITH DIFFERENTIAL/PLATELET
Basophils Absolute: 0.1 10*3/uL (ref 0.0–0.1)
Basophils Relative: 0.8 % (ref 0.0–3.0)
Eosinophils Absolute: 0.2 10*3/uL (ref 0.0–0.7)
Eosinophils Relative: 2.3 % (ref 0.0–5.0)
HCT: 36.8 % — ABNORMAL LOW (ref 39.0–52.0)
Hemoglobin: 12.5 g/dL — ABNORMAL LOW (ref 13.0–17.0)
Lymphocytes Relative: 25.7 % (ref 12.0–46.0)
Lymphs Abs: 2.3 10*3/uL (ref 0.7–4.0)
MCHC: 34 g/dL (ref 30.0–36.0)
MCV: 90.3 fl (ref 78.0–100.0)
Monocytes Absolute: 0.9 10*3/uL (ref 0.1–1.0)
Monocytes Relative: 9.8 % (ref 3.0–12.0)
Neutro Abs: 5.5 10*3/uL (ref 1.4–7.7)
Neutrophils Relative %: 61.4 % (ref 43.0–77.0)
Platelets: 197 10*3/uL (ref 150.0–400.0)
RBC: 4.08 Mil/uL — ABNORMAL LOW (ref 4.22–5.81)
RDW: 13.8 % (ref 11.5–15.5)
WBC: 8.9 10*3/uL (ref 4.0–10.5)

## 2020-04-22 LAB — COMPREHENSIVE METABOLIC PANEL
ALT: 15 U/L (ref 0–53)
AST: 16 U/L (ref 0–37)
Albumin: 4 g/dL (ref 3.5–5.2)
Alkaline Phosphatase: 58 U/L (ref 39–117)
BUN: 26 mg/dL — ABNORMAL HIGH (ref 6–23)
CO2: 25 mEq/L (ref 19–32)
Calcium: 9.7 mg/dL (ref 8.4–10.5)
Chloride: 103 mEq/L (ref 96–112)
Creatinine, Ser: 1.41 mg/dL (ref 0.40–1.50)
GFR: 49.23 mL/min — ABNORMAL LOW (ref >60.00)
Glucose, Bld: 93 mg/dL (ref 70–99)
Potassium: 4.6 mEq/L (ref 3.5–5.1)
Sodium: 137 mEq/L (ref 135–145)
Total Bilirubin: 0.6 mg/dL (ref 0.2–1.2)
Total Protein: 6.7 g/dL (ref 6.0–8.3)

## 2020-04-23 ENCOUNTER — Ambulatory Visit (INDEPENDENT_AMBULATORY_CARE_PROVIDER_SITE_OTHER): Payer: Medicare Other | Admitting: Medical

## 2020-04-23 VITALS — BP 122/62 | HR 61 | Temp 97.9°F | Resp 18 | Ht 64.0 in | Wt 178.0 lb

## 2020-04-23 DIAGNOSIS — D72829 Elevated white blood cell count, unspecified: Secondary | ICD-10-CM | POA: Diagnosis not present

## 2020-04-23 DIAGNOSIS — J690 Pneumonitis due to inhalation of food and vomit: Secondary | ICD-10-CM | POA: Diagnosis not present

## 2020-04-23 DIAGNOSIS — R0600 Dyspnea, unspecified: Secondary | ICD-10-CM

## 2020-04-23 DIAGNOSIS — N179 Acute kidney failure, unspecified: Secondary | ICD-10-CM

## 2020-04-23 DIAGNOSIS — I429 Cardiomyopathy, unspecified: Secondary | ICD-10-CM

## 2020-04-23 NOTE — Patient Instructions (Addendum)
Hx of atrial fibrillation with recent dilated cardiomyopathy. Exam negative, vitals stable, good labs and normal cxr. Continue current meds.  Dyspnea minimal and expected with basline condition. Better since left hospital. Good 02 sat. Keep checking at home. Monitor weight daily. If edema or sob lying flat on back notify us.  Aspiration pneumonia by cxr appears resolved. Normal wbc and no fever.  Kidney function looks mild low but near historical range. Stay adequately hydrated.  For gerd continue omeprazole.  Follow up with pcp end of august and keep follow up with cardiollgist.(follow up as needed as well)

## 2020-04-23 NOTE — Progress Notes (Signed)
Subjective:    Patient ID: Ryan [name], male    DOB: 10/09/1945, 74 y.o.   MRN: [id number]  HPI  Pt in for follow up.(Reviewed entire discharge summary) cxr and labs done yesterday post hospitalization reviewed.  Pt had recent ED evaluation and then admitted on 7/621 and discharge on 7/11.  Day of admission he had ausea, vomiting, diarrhea, and diffuse gastritis which occurred after eating a lot of fried foods on 04/08/2020.  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 In ED his troponin was elevated.  Pt has hx of emphysema.   Hx of pulmonary edema but diuresed in hospital.   Had aspiration pneumonia.   Also had aki.  Anxiety since the hospitalization. Anxiety is getting less.   Pt seeing cardiologist end of September.   Review of Systems  Constitutional: Negative for chills, diaphoresis, fatigue and fever.  HENT: Negative for congestion and ear pain.   Respiratory: Positive for shortness of breath. Negative for chest tightness and wheezing.        Very minimal slight dypnea on exertion. Improved since left hospital.   Cardiovascular: Negative for chest pain and palpitations.  Gastrointestinal: Negative for abdominal pain, constipation, diarrhea, nausea and vomiting.  Genitourinary: Negative for dysuria.  Musculoskeletal: Negative for back pain.  Skin: Negative for rash.  Neurological: Negative for dizziness, tremors, weakness and light-headedness.  Hematological: Negative for adenopathy. Does not bruise/bleed easily.  Psychiatric/Behavioral: Negative for behavioral problems and confusion. The patient is nervous/anxious.        Anxiety improving gradually since discharge.    Past Medical History:  Diagnosis Date  . A-fib ([place]) 03/22/2018  . Anemia   . Atrial flutter ([place]) 03/31/2012   converted spontaneously to sinus  . CAD (coronary artery disease)    reportedly moderate CAD; managed medically  . COPD (chronic  obstructive pulmonary disease) ([place])   . Eczema 08/03/2014  . Edema 03/22/2018  . GERD (gastroesophageal reflux disease)   . H/O: rheumatic fever   . Heart murmur   . Heart murmur   . High risk medication use    on amiodarone since 03/31/2012  . History of colonoscopy   . Hyperglycemia 06/21/2017  . Hyperlipidemia   . Hypertension   . Kidney stone 06/21/2017  . Kidney stones   . Lobar pneumonia ([place]) 03/22/2018  . Low vitamin D level 06/21/2017  . Macular degeneration   . Osteoporosis 05/31/2016     Social History   Socioeconomic History  . Marital status: Married    Spouse name: Not on file  . Number of children: 0  . Years of education: Not on file  . Highest education level: Not on file  Occupational History  . Occupation: retired    Fish farm manager: RETIRED  Tobacco Use  . Smoking status: Former Smoker    Packs/day: 0.50    Years: 50.00    Pack years: 25.00    Types: Cigarettes    Quit date: 05/01/2013    Years since quitting: 6.9  . Smokeless tobacco: Former Systems developer  . Tobacco comment: using e[employer]ig[employer]  Vaping Use  . Vaping Use: Never used  Substance and Sexual Activity  . Alcohol use: No  . Drug use: No  . Sexual activity: Yes    Comment: lives with wife, no dietary restrictions  Other Topics Concern  . Not on file  Social History Narrative  . Not on file   Social Determinants of Health   Financial Resource Strain:   .  Difficulty of Paying Living Expenses:   Food Insecurity:   . Worried About Charity fundraiser in the Last Year:   . Arboriculturist in the Last Year:   Transportation Needs:   . Film/video editor (Medical):   Marland Kitchen Lack of Transportation (Non-Medical):   Physical Activity:   . Days of Exercise per Week:   . Minutes of Exercise per Session:   Stress:   . Feeling of Stress :   Social Connections:   . Frequency of Communication with Friends and Family:   . Frequency of Social Gatherings with Friends and Family:   . Attends Religious Services:    . Active Member of Clubs or Organizations:   . Attends Archivist Meetings:   Marland Kitchen Marital Status:   Intimate Partner Violence:   . Fear of Current or Ex-Partner:   . Emotionally Abused:   Marland Kitchen Physically Abused:   . Sexually Abused:     Past Surgical History:  Procedure Laterality Date  . APPENDECTOMY    . CARDIAC CATHETERIZATION  04/22/2011   moderate left main and RCA stenosis not significant by FFR and IVUS on medical therapy  . CARDIOVERSION N/A 04/25/2018   Procedure: CARDIOVERSION;  Surgeon: Skeet Latch, MD;  Location: [facility] ENDOSCOPY;  Service: Cardiovascular;  Laterality: N/A;  . CERVICAL DISCECTOMY     C5,6,7 disc fused  with plate and 5 1" screws  . CHOLECYSTECTOMY    . colon polyp removal  3/1.19  . FOOT SURGERY     right calcification removed from top of foot  . knee cartiledge Right    right knee  . MOUTH SURGERY     periodontal surgery, bridges, splint in front,     Family History  Problem Relation Age of Onset  . Heart disease Mother   . Diabetes Mother   . Cirrhosis Mother   . Emphysema Mother        never smoked but 2nd hand through her spouse  . Hypertension Mother   . Macular degeneration Mother   . Heart disease Father   . Cancer Father        prostate  . Hyperlipidemia Father   . Hypertension Father   . Varicose Veins Father   . Heart attack Father   . Peripheral vascular disease Father   . Heart disease Sister   . Arthritis Sister   . Hyperlipidemia Sister   . Obesity Sister   . Macular degeneration Sister   . Heart disease Brother        5 stents  . Hyperlipidemia Brother   . Macular degeneration Maternal Grandfather   . Cirrhosis Sister   . Obesity Sister   . Arthritis Sister   . Heart disease Sister   . Obesity Sister   . Liver disease Other   . Prostate cancer Other   . Coronary artery disease Other     Allergies  Allergen Reactions  . Albumin (Human) Anaphylaxis  . Polymyxin B-Trimethoprim Swelling    Eye drops  made eyes swell  . Pseudoephedrine Other (See Comments)    Stomach cramps  . Codeine Hives, Itching and Rash  . Guaiacol Other (See Comments)    Hallucinations  . Statins Other (See Comments)    Muscle cramps   . Meloxicam Other (See Comments)    Unknown reaction  . Pseudoephedrine-Guaifenesin Nausea And Vomiting    Stomach cramps  . Rosuvastatin Calcium Other (See Comments)    Muscle aches  .  Tapentadol Other (See Comments)    Unknown reaction  . Ciprofloxacin Hives, Itching, Nausea Only and Rash  . Moxifloxacin Nausea Only and Other (See Comments)    Headaches, stomach cramps   . Rofecoxib Other (See Comments)    Stomach cramping    Current Outpatient Medications on File Prior to Visit  Medication Sig Dispense Refill  . brimonidine (ALPHAGAN) 0.2 % ophthalmic solution Place 1 drop into the left eye in the morning and at bedtime.   7  . Cholecalciferol (VITAMIN D3) 5000 units CAPS Take 5,000 Units by mouth every other day.     . latanoprost (XALATAN) 0.005 % ophthalmic solution Place 1 drop into the left eye at bedtime.     . levalbuterol (XOPENEX HFA) 45 MCG/ACT inhaler Inhale 2 puffs into the lungs every 4 (four) hours as needed for wheezing. 1 Inhaler 0  . LORazepam (ATIVAN) 0.5 MG tablet Take 1 tablet (0.5 mg total) by mouth 2 (two) times daily as needed for anxiety. 30 tablet 0  . Multiple Vitamins-Minerals (ICAPS MV) TABS Take 1 tablet by mouth 2 (two) times daily.     . nitroGLYCERIN (NITROSTAT) 0.4 MG SL tablet Place 1 tablet (0.4 mg total) under the tongue every 5 (five) minutes as needed for chest pain. 30 tablet 0  . omeprazole (PRILOSEC OTC) 20 MG tablet Take 20 mg by mouth daily.    . ondansetron (ZOFRAN) 4 MG tablet Take 1 tablet (4 mg total) by mouth every 6 (six) hours as needed for nausea. 20 tablet 0  . Ranibizumab (LUCENTIS IO) Inject 1 Dose into the eye as needed (Macular degeneration).     . tamsulosin (FLOMAX) 0.4 MG CAPS capsule Take 1 capsule (0.4 mg  total) by mouth daily. 30 capsule 0  . clobetasol (TEMOVATE) 0.05 % external solution APPLY 1 APPLICATION TOPICALLY TWICE DAILY (Patient not taking: Reported on 04/23/2020) 100 mL 3  . ELIQUIS 5 MG TABS tablet TAKE 1 TABLET BY MOUTH TWO  TIMES DAILY (Patient not taking: Reported on 04/23/2020) 180 tablet 3  . furosemide (LASIX) 20 MG tablet TAKE 1 TABLET BY MOUTH  DAILY (Patient not taking: Reported on 04/23/2020) 90 tablet 3  . metoprolol tartrate (LOPRESSOR) 25 MG tablet TAKE 1 TABLET BY MOUTH  TWICE DAILY (Patient not taking: Reported on 04/23/2020) 180 tablet 3  . potassium chloride (KLOR-CON) 10 MEQ tablet TAKE 1 TABLET BY MOUTH  DAILY (Patient not taking: Reported on 04/23/2020) 90 tablet 3  . propafenone (RYTHMOL) 225 MG tablet TAKE 1 TABLET BY MOUTH TWO  TIMES DAILY (Patient not taking: Reported on 04/23/2020) 180 tablet 2   No current facility-administered medications on file prior to visit.    BP 122/62   Pulse 61   Temp 97.9 F (36.6 C) (Oral)   Resp 18   Ht 5\' 4"  (1.626 m)   Wt 178 lb (80.7 kg)   SpO2 95%   BMI 30.55 kg/m       Objective:   Physical Exam  General Mental Status- Alert. General Appearance- Not in acute distress.   Skin General: Color- Normal Color. Moisture- Normal Moisture.  Neck Carotid Arteries- Normal color. Moisture- Normal Moisture. No carotid bruits. No JVD.  Chest and Lung Exam Auscultation: Breath Sounds:-Normal.(lying supine no orthopnea)  Cardiovascular Auscultation:Rythm- Regular. Murmurs & Other Heart Sounds:Auscultation of the heart reveals- No Murmurs.  Abdomen Inspection:-Inspeection Normal. Palpation/Percussion:Note:No mass. Palpation and Percussion of the abdomen reveal- Non Tender, Non Distended + BS, no rebound or guarding.  Neurologic Cranial Nerve exam:- CN III-XII intact(No nystagmus), symmetric smile. Strength:- 5/5 equal and symmetric strength both upper and lower extremities.  Lower ext- no pedal edema. Negative  homans sign.     Assessment & Plan:  Hx of atrial fibrillation with recent dilated cardiomyopathy. Exam negative, vitals stable, good labs and normal cxr. Continue current meds.  Dyspnea minimal and expected with basline condition. Better since left hospital. Good 02 sat. Keep checking at home. Monitor weight daily. If edema or sob lying flat on back notify us.  Aspiration pneumonia by cxr appears resolved. Normal wbc and no fever.  Kidney function looks mild low but near historical range. Stay adequately hydrated.  For gerd continue omeprazole.  Follow up with pcp end of august and keep follow up with cardiollgist.(follow up as needed as well)  Mackie Pai, PA-C   Time spent with patient today was 40  minutes which consisted of chart review, discussing diagnoses, work up/lab review from yesterday, treatment and documentation.

## 2020-04-24 ENCOUNTER — Other Ambulatory Visit: Payer: Self-pay | Admitting: *Deleted

## 2020-04-24 DIAGNOSIS — R0600 Dyspnea, unspecified: Secondary | ICD-10-CM

## 2020-04-24 MED ORDER — CEFDINIR 300 MG PO CAPS: 300.0000 mg | ORAL_CAPSULE | Freq: Two times a day (BID) | ORAL | 0 refills | Status: AC

## 2020-05-02 ENCOUNTER — Other Ambulatory Visit: Payer: Self-pay

## 2020-05-02 ENCOUNTER — Emergency Department (HOSPITAL_COMMUNITY): Payer: Medicare Other

## 2020-05-02 ENCOUNTER — Emergency Department (HOSPITAL_COMMUNITY)
Admission: EM | Admit: 2020-05-02 | Discharge: 2020-05-02 | Disposition: A | Discharge status: Home / Self Care | Payer: Medicare Other | Attending: Emergency Medicine | Admitting: Emergency Medicine

## 2020-05-02 ENCOUNTER — Encounter (HOSPITAL_COMMUNITY): Payer: Self-pay | Admitting: Emergency Medicine

## 2020-05-02 DIAGNOSIS — R600 Localized edema: Secondary | ICD-10-CM | POA: Insufficient documentation

## 2020-05-02 DIAGNOSIS — R918 Other nonspecific abnormal finding of lung field: Secondary | ICD-10-CM | POA: Diagnosis not present

## 2020-05-02 DIAGNOSIS — R05 Cough: Secondary | ICD-10-CM | POA: Insufficient documentation

## 2020-05-02 DIAGNOSIS — Z87891 Personal history of nicotine dependence: Secondary | ICD-10-CM | POA: Diagnosis not present

## 2020-05-02 DIAGNOSIS — I509 Heart failure, unspecified: Secondary | ICD-10-CM | POA: Insufficient documentation

## 2020-05-02 DIAGNOSIS — J449 Chronic obstructive pulmonary disease, unspecified: Secondary | ICD-10-CM | POA: Insufficient documentation

## 2020-05-02 DIAGNOSIS — R001 Bradycardia, unspecified: Secondary | ICD-10-CM | POA: Diagnosis not present

## 2020-05-02 DIAGNOSIS — R0602 Shortness of breath: Secondary | ICD-10-CM | POA: Insufficient documentation

## 2020-05-02 DIAGNOSIS — Z7901 Long term (current) use of anticoagulants: Secondary | ICD-10-CM | POA: Insufficient documentation

## 2020-05-02 DIAGNOSIS — R079 Chest pain, unspecified: Secondary | ICD-10-CM

## 2020-05-02 DIAGNOSIS — R0789 Other chest pain: Secondary | ICD-10-CM | POA: Insufficient documentation

## 2020-05-02 DIAGNOSIS — I10 Essential (primary) hypertension: Secondary | ICD-10-CM | POA: Diagnosis not present

## 2020-05-02 DIAGNOSIS — Z79899 Other long term (current) drug therapy: Secondary | ICD-10-CM | POA: Insufficient documentation

## 2020-05-02 DIAGNOSIS — I2510 Atherosclerotic heart disease of native coronary artery without angina pectoris: Secondary | ICD-10-CM | POA: Diagnosis not present

## 2020-05-02 DIAGNOSIS — I110 Hypertensive heart disease with heart failure: Secondary | ICD-10-CM | POA: Diagnosis not present

## 2020-05-02 DIAGNOSIS — I959 Hypotension, unspecified: Secondary | ICD-10-CM | POA: Diagnosis not present

## 2020-05-02 HISTORY — DX: Heart failure, unspecified: I50.9

## 2020-05-02 LAB — CBC WITH DIFFERENTIAL/PLATELET
Abs Immature Granulocytes: 0.07 10*3/uL (ref 0.00–0.07)
Basophils Absolute: 0 10*3/uL (ref 0.0–0.1)
Basophils Relative: 0 %
Eosinophils Absolute: 0.2 10*3/uL (ref 0.0–0.5)
Eosinophils Relative: 2 %
HCT: 35 % — ABNORMAL LOW (ref 39.0–52.0)
Hemoglobin: 11.8 g/dL — ABNORMAL LOW (ref 13.0–17.0)
Immature Granulocytes: 1 %
Lymphocytes Relative: 27 %
Lymphs Abs: 2.4 10*3/uL (ref 0.7–4.0)
MCH: 31.1 pg (ref 26.0–34.0)
MCHC: 33.7 g/dL (ref 30.0–36.0)
MCV: 92.1 fL (ref 80.0–100.0)
Monocytes Absolute: 0.8 10*3/uL (ref 0.1–1.0)
Monocytes Relative: 9 %
Neutro Abs: 5.5 10*3/uL (ref 1.7–7.7)
Neutrophils Relative %: 61 %
Platelets: 149 10*3/uL — ABNORMAL LOW (ref 150–400)
RBC: 3.8 MIL/uL — ABNORMAL LOW (ref 4.22–5.81)
RDW: 13.3 % (ref 11.5–15.5)
WBC: 9 10*3/uL (ref 4.0–10.5)
nRBC: 0 % (ref 0.0–0.2)

## 2020-05-02 LAB — BASIC METABOLIC PANEL
Anion gap: 9 (ref 5–15)
BUN: 19 mg/dL (ref 8–23)
CO2: 24 mmol/L (ref 22–32)
Calcium: 9.7 mg/dL (ref 8.9–10.3)
Chloride: 103 mmol/L (ref 98–111)
Creatinine, Ser: 1.42 mg/dL — ABNORMAL HIGH (ref 0.61–1.24)
GFR calc Af Amer: 56 mL/min — ABNORMAL LOW (ref >60)
GFR calc non Af Amer: 49 mL/min — ABNORMAL LOW (ref >60)
Glucose, Bld: 79 mg/dL (ref 70–99)
Potassium: 4.7 mmol/L (ref 3.5–5.1)
Sodium: 136 mmol/L (ref 135–145)

## 2020-05-02 LAB — PROTIME-INR
INR: 1.2 (ref 0.8–1.2)
Prothrombin Time: 14.7 seconds (ref 11.4–15.2)

## 2020-05-02 LAB — TROPONIN I (HIGH SENSITIVITY)
Troponin I (High Sensitivity): 8 ng/L (ref <18)
Troponin I (High Sensitivity): 8 ng/L (ref <18)

## 2020-05-02 LAB — BRAIN NATRIURETIC PEPTIDE: B Natriuretic Peptide: 172.7 pg/mL — ABNORMAL HIGH (ref 0.0–100.0)

## 2020-05-02 MED ORDER — SODIUM CHLORIDE 0.9% FLUSH
3.0000 mL | Freq: Once | INTRAVENOUS | Status: AC
  Administered 2020-05-02: 3 mL via INTRAVENOUS

## 2020-05-02 NOTE — ED Notes (Signed)
Patient transported to X-ray 

## 2020-05-02 NOTE — ED Triage Notes (Signed)
Patient arrived with EMS reports central chest pain today with SOB , denies emesis or diaphoresis , he took 2 NTG sl and ASA 342 mg prior to arrival with mild relief , his cardiologist is Dr. Lovena Le.

## 2020-05-02 NOTE — ED Provider Notes (Signed)
[facility] EMERGENCY DEPARTMENT Provider Note   CSN: [id number] Arrival date & time: 05/02/20  5465     History Chief Complaint  Patient presents with  . Chest Pain    Ryan [name] is a 74 y.o. male.  74 year old male with history of A. fib (on Eliquis), COPD, dilated cardiomyopathy, CHF presents with shortness of breath and chest pain.  Patient states that he did yard work this morning and ate lunch per his usual routine and felt well, took a nap and upon waking at 3:00 noticed that he had a sharp pain on the left side of his chest just under the left breast.  Pain is associated with shortness of breath.  Patient got up and walked around which made the shortness of breath worse which prompted him to go to the fire department to have his vitals checked.  Patient states his chest pain has become a constant heavy pressure in his chest, not worse with exertion, pain has somewhat eased with 3 aspirin and 3 nitro and with the oxygen EMS placed him on.  Patient reports persistent pain and shortness of breath.  Denies diaphoresis, nausea, vomiting.  Patient has a history of hyperlipidemia, hypertension.  No history of diabetes, is a former smoker.  Patient was admitted to the hospital about 4 weeks ago, states that he developed pneumonia from lying in bed for 7 days, was given unknown medication starting with an F as well as Mucinex and the symptoms are improving, states his cough is productive with cloudy sputum.        Past Medical History:  Diagnosis Date  . A-fib ([place]) 03/22/2018  . Anemia   . Atrial flutter ([place]) 03/31/2012   converted spontaneously to sinus  . CAD (coronary artery disease)    reportedly moderate CAD; managed medically  . CHF (congestive heart failure) ([place])   . COPD (chronic obstructive pulmonary disease) ([place])   . Eczema 08/03/2014  . Edema 03/22/2018  . GERD (gastroesophageal reflux disease)   . H/O: rheumatic fever   . Heart murmur   . Heart  murmur   . High risk medication use    on amiodarone since 03/31/2012  . History of colonoscopy   . Hyperglycemia 06/21/2017  . Hyperlipidemia   . Hypertension   . Kidney stone 06/21/2017  . Kidney stones   . Lobar pneumonia ([place]) 03/22/2018  . Low vitamin D level 06/21/2017  . Macular degeneration   . Osteoporosis 05/31/2016    Patient Active Problem List   Diagnosis Date Noted  . Dyspnea 04/20/2020  . Anxiety 04/20/2020  . Gastritis 04/10/2020  . Dilated cardiomyopathy ([place]) 04/10/2020  . Emphysema lung ([place]) 04/10/2020  . Elevated troponin 04/09/2020  . Nausea, vomiting, and diarrhea 04/09/2020  . Hypomagnesemia 04/09/2020  . Hypokalemia 04/09/2020  . Acute urinary retention 04/09/2020  . Statin intolerance 12/19/2019  . Vitamin D deficiency 09/18/2019  . CHF (congestive heart failure) ([place]) 03/22/2018  . A-fib ([place]) 03/22/2018  . Edema 03/22/2018  . H/O: rheumatic fever 12/27/2017  . Low vitamin D level 06/21/2017  . Hyperglycemia 06/21/2017  . Kidney stone 06/21/2017  . 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 Osteoporosis 05/31/2016  . BPH (benign prostatic hyperplasia) 01/20/2015  . Erectile dysfunction 01/20/2015  . Rectal bleeding 01/20/2015  . Preventative health care 01/20/2015  . Eczema 08/03/2014  . Sleep apnea 03/01/2014  . Anticoagulation adequate with anticoagulant therapy 03/01/2014  . Pre-syncope 02/26/2014  . Abdominal pain 02/26/2014  .  PAD (peripheral artery disease) ([place]) 02/26/2014  . Constipation 03/06/2013  . Easy bruising 03/06/2013  . History of alcohol abuse 04/15/2012  . CAD (coronary atherosclerotic disease) 05/08/2011  . Atypical chest pain 03/10/2011  . 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 UNS ADVRS EFF UNS RX MEDICINAL&BIOLOGICAL SBSTNC 11/11/2009  . H/O tobacco use, presenting hazards to health 07/29/2009  . COPD GOLD I 07/29/2009  . Hyperlipidemia, mixed 06/20/2007  . Macular  degeneration (senile) of retina 06/20/2007  . Essential hypertension 06/20/2007  . GERD 06/16/2007    Past Surgical History:  Procedure Laterality Date  . APPENDECTOMY    . CARDIAC CATHETERIZATION  04/22/2011   moderate left main and RCA stenosis not significant by FFR and IVUS on medical therapy  . CARDIOVERSION N/A 04/25/2018   Procedure: CARDIOVERSION;  Surgeon: Skeet Latch, MD;  [facility] ENDOSCOPY;  Service: Cardiovascular;  Laterality: N/A;  . CERVICAL DISCECTOMY     C5,6,7 disc fused  with plate and 5 1" screws  . CHOLECYSTECTOMY    . colon polyp removal  3/1.19  . FOOT SURGERY     right calcification removed from top of foot  . knee cartiledge Right    right knee  . MOUTH SURGERY     periodontal surgery, bridges, splint in front,        Family History  Problem Relation Age of Onset  . Heart disease Mother   . Diabetes Mother   . Cirrhosis Mother   . Emphysema Mother        never smoked but 2nd hand through her spouse  . Hypertension Mother   . Macular degeneration Mother   . Heart disease Father   . Cancer Father        prostate  . Hyperlipidemia Father   . Hypertension Father   . Varicose Veins Father   . Heart attack Father   . Peripheral vascular disease Father   . Heart disease Sister   . Arthritis Sister   . Hyperlipidemia Sister   . Obesity Sister   . Macular degeneration Sister   . Heart disease Brother        5 stents  . Hyperlipidemia Brother   . Macular degeneration Maternal Grandfather   . Cirrhosis Sister   . Obesity Sister   . Arthritis Sister   . Heart disease Sister   . Obesity Sister   . Liver disease Other   . Prostate cancer Other   . Coronary artery disease Other     Social History   Tobacco Use  . Smoking status: Former Smoker    Packs/day: 0.50    Years: 50.00    Pack years: 25.00    Types: Cigarettes    Quit date: 05/01/2013    Years since quitting: 7.0  . Smokeless tobacco: Former Systems developer  . Tobacco comment:  using e-Cig//ldc  Vaping Use  . Vaping Use: Never used  Substance Use Topics  . Alcohol use: No  . Drug use: No    Home Medications Prior to Admission medications   Medication Sig Start Date End Date Taking? Authorizing Provider  brimonidine (ALPHAGAN) 0.2 % ophthalmic solution Place 1 drop into the left eye in the morning and at bedtime.  09/10/17  Yes [provider]  Cholecalciferol (VITAMIN D3) 5000 units CAPS Take 5,000 Units by mouth every other day.    Yes [provider]  clobetasol (TEMOVATE) 0.05 % external solution APPLY 1 APPLICATION TOPICALLY  TWICE DAILY 11/20/16  Yes [name], MD  ELIQUIS 5 MG TABS tablet TAKE 1 TABLET BY MOUTH TWO  TIMES DAILY 06/05/19  Yes Evans Lance, MD  fluconazole (DIFLUCAN) 100 MG tablet Take 100 mg by mouth as needed (oral flush).   Yes [provider]  furosemide (LASIX) 20 MG tablet TAKE 1 TABLET BY MOUTH  DAILY 08/14/19  Yes [name], MD  latanoprost (XALATAN) 0.005 % ophthalmic solution Place 1 drop into the left eye at bedtime.    Yes [provider]  levalbuterol Penne Lash HFA) 45 MCG/ACT inhaler Inhale 2 puffs into the lungs every 4 (four) hours as needed for wheezing. 04/14/20 04/14/21 Yes Allie Bossier, MD  LORazepam (ATIVAN) 0.5 MG tablet Take 1 tablet (0.5 mg total) by mouth 2 (two) times daily as needed for anxiety. 04/17/20  Yes [name], MD  metoprolol tartrate (LOPRESSOR) 25 MG tablet TAKE 1 TABLET BY MOUTH  TWICE DAILY 07/18/19  Yes Evans Lance, MD  Multiple Vitamins-Minerals (ICAPS MV) TABS Take 1 tablet by mouth 2 (two) times daily.    Yes [provider]  nitroGLYCERIN (NITROSTAT) 0.4 MG SL tablet Place 1 tablet (0.4 mg total) under the tongue every 5 (five) minutes as needed for chest pain. 04/14/20  Yes Allie Bossier, MD  omeprazole (PRILOSEC OTC) 20 MG tablet Take 20 mg by mouth daily.   Yes [provider]  potassium chloride (KLOR-CON) 10 MEQ tablet TAKE  1 TABLET BY MOUTH  DAILY 08/14/19  Yes Evans Lance, MD  propafenone (RYTHMOL) 225 MG tablet TAKE 1 TABLET BY MOUTH TWO  TIMES DAILY 09/25/19  Yes Evans Lance, MD  Ranibizumab (LUCENTIS IO) Inject 1 Dose into the eye as needed (Macular degeneration).    Yes [provider]  tamsulosin (FLOMAX) 0.4 MG CAPS capsule Take 1 capsule (0.4 mg total) by mouth daily. 07/25/17  Yes Hedges, Dellis Filbert, PA-C  ondansetron (ZOFRAN) 4 MG tablet Take 1 tablet (4 mg total) by mouth every 6 (six) hours as needed for nausea. Patient not taking: Reported on 05/02/2020 04/14/20   Allie Bossier, MD    Allergies    Albumin (human), Polymyxin b-trimethoprim, Pseudoephedrine, Codeine, Guaiacol, Statins, Meloxicam, Pseudoephedrine-guaifenesin, Rosuvastatin calcium, Tapentadol, Ciprofloxacin, Moxifloxacin, and Rofecoxib  Review of Systems   Review of Systems  Constitutional: Negative for diaphoresis.  Respiratory: Positive for cough, chest tightness and shortness of breath.   Cardiovascular: Positive for chest pain. Negative for palpitations and leg swelling.  Gastrointestinal: Negative for abdominal pain, nausea and vomiting.  Musculoskeletal: Negative for back pain and myalgias.  Skin: Negative for wound.  Allergic/Immunologic: Negative for immunocompromised state.  Neurological: Negative for weakness.  All other systems reviewed and are negative.   Physical Exam Updated Vital Signs BP (!) 122/62 (BP Location: Left Arm)   Pulse 51   Temp 97.8 F (36.6 C) (Oral)   Resp 18   Ht 5\' 4"  (1.626 m)   Wt 85 kg   SpO2 99%   BMI 32.17 kg/m   Physical Exam Vitals and nursing note reviewed.  Constitutional:      General: He is in acute distress.     Appearance: He is well-developed. He is not diaphoretic.  HENT:     Head: Normocephalic and atraumatic.  Cardiovascular:     Rate and Rhythm: Regular rhythm. Bradycardia present.     Heart sounds: Normal heart sounds.  Pulmonary:     Effort:  Tachypnea present.  Breath sounds: Normal breath sounds. No wheezing.  Chest:     Chest wall: No tenderness.  Abdominal:     Palpations: Abdomen is soft.     Tenderness: There is no abdominal tenderness.  Musculoskeletal:     Right lower leg: Edema present.     Left lower leg: Edema present.     Comments: Trace edema to bilateral lower legs, has compression socks on.   Skin:    General: Skin is warm and dry.     Findings: No erythema or rash.  Neurological:     Mental Status: He is alert and oriented to person, place, and time.  Psychiatric:        Behavior: Behavior normal.     ED Results / Procedures / Treatments   Labs (all labs ordered are listed, but only abnormal results are displayed) Labs Reviewed  BASIC METABOLIC PANEL - Abnormal; Notable for the following components:      Result Value   Creatinine, Ser 1.42 (*)    GFR calc non Af Amer 49 (*)    GFR calc Af Amer 56 (*)    All other components within normal limits  BRAIN NATRIURETIC PEPTIDE - Abnormal; Notable for the following components:   B Natriuretic Peptide 172.7 (*)    All other components within normal limits  CBC WITH DIFFERENTIAL/PLATELET - Abnormal; Notable for the following components:   RBC 3.80 (*)    Hemoglobin 11.8 (*)    HCT 35.0 (*)    Platelets 149 (*)    All other components within normal limits  PROTIME-INR  TROPONIN I (HIGH SENSITIVITY)  TROPONIN I (HIGH SENSITIVITY)    EKG EKG Interpretation  Date/Time:  Thursday May 02 2020 19:26:25 EDT Ventricular Rate:  50 PR Interval:  192 QRS Duration: 84 QT Interval:  450 QTC Calculation: 410 R Axis:   32 Text Interpretation: Sinus bradycardia diffuse ST depressions similar to April 11 2020 Confirmed by Sherwood Gambler [phone]) on 05/02/2020 7:49:25 PM   Radiology DG Chest 2 View  Result Date: 05/02/2020 CLINICAL DATA:  Chest pain and shortness of breath. EXAM: CHEST - 2 VIEW COMPARISON:  Radiograph 10 days ago 04/22/2020. Lung bases  from abdominal CT 04/09/2020 FINDINGS: Right greater than left basilar opacities that are not changed from prior, likely representing scarring when correlated with recent CT. Upper normal heart size unchanged from prior. Unchanged mediastinal contours. No pulmonary edema, acute airspace disease, or pleural effusion. Mild hyperinflation. No acute osseous abnormalities are seen. IMPRESSION: Stable bibasilar opacities, likely scarring. No acute findings. Electronically Signed   By: Keith Rake M.D.   On: 05/02/2020 20:52    Procedures Procedures (including critical care time)  Medications Ordered in ED Medications  sodium chloride flush (NS) 0.9 % injection 3 mL (3 mLs Intravenous Given 05/02/20 2026)    ED Course  I have reviewed the triage vital signs and the nursing notes.  Pertinent labs & imaging results that were available during my care of the patient were reviewed by me and considered in my medical decision making (see chart for details).  Clinical Course as of May 02 2304  Thu May 02, 2742  6681 74 year old male presents with complaint of shortness of breath and chest pain.  Patient had 3 nitro and 3 aspirin with EMS prior to arrival which has helped with some of his symptoms.  On exam, patient appears uncomfortable, he is tachypneic, was placed on 2 L nasal cannula with improvement in his symptoms. EKG  shows diffuse ST depressions however unchanged from prior EKG on July 8.  Troponin is 8 and 8, no significant change.  BNP is 172.7, no significant changes to CBC and BMP.  Chest x-ray shows stable bibasilar opacities likely scarring.  No acute findings. Patient's hear score is 6.  Offered admission to patient however he states that he is feeling better at this time and would prefer to go home to follow-up with his cardiologist tomorrow.  Patient's wife is in agreement with this plan and feels comfortable taking him home.  Patient agrees to return to the ER for any return of, new or  worsening symptoms.   [LM]    Clinical Course User Index [LM] Roque Lias   MDM Rules/Calculators/A&P                          Final Clinical Impression(s) / ED Diagnoses Final diagnoses:  Chest pain, unspecified type  Shortness of breath    Rx / DC Orders ED Discharge Orders    None       Tacy Learn, PA-C 05/02/20 2305    Sherwood Gambler, MD 05/04/20 [phone]

## 2020-05-02 NOTE — ED Notes (Signed)
Wife wants to be called when pt is in room

## 2020-05-02 NOTE — Discharge Instructions (Addendum)
Return to the emergency room for any new or worsening symptoms otherwise follow-up with your primary care provider or cardiologist tomorrow as planned.

## 2020-05-03 DIAGNOSIS — M5416 Radiculopathy, lumbar region: Secondary | ICD-10-CM | POA: Diagnosis not present

## 2020-05-03 DIAGNOSIS — M48062 Spinal stenosis, lumbar region with neurogenic claudication: Secondary | ICD-10-CM | POA: Diagnosis not present

## 2020-05-06 ENCOUNTER — Telehealth: Payer: Self-pay | Admitting: *Deleted

## 2020-05-06 NOTE — Telephone Encounter (Signed)
   [facility] Pre-operative Risk Assessment    HEARTCARE STAFF: - Please ensure there is not already an duplicate clearance open for this procedure. - Under Visit Info/Reason for Call, type in Other and utilize the format Clearance MM/DD/YY or Clearance TBD. Do not use dashes or single digits. - If request is for dental extraction, please clarify the # of teeth to be extracted.  Request for surgical clearance:  1. What type of surgery is being performed? NEED PROCEDURE; LEFT MESSAGE FOR REQUESTING OFFICE TO CALL BACK   2. When is this surgery scheduled? 05/31/20   3. What type of clearance is required (medical clearance vs. Pharmacy clearance to hold med vs. Both)? BOTH  4. Are there any medications that need to be held prior to surgery and how long? ELIQUIS x 3 DAYS PRIOR   5. Practice name and name of physician performing surgery? [facility]' DR. HARKINS/DR. EICHMAN   6. What is the office phone number? [phone]   7.   What is the office fax number? [phone]  8.   Anesthesia type (None, local, MAC, general) ? NOT LISTED   Julaine Hua 05/06/2020, 9:12 AM  _________________________________________________________________   (provider comments below)

## 2020-05-06 NOTE — Telephone Encounter (Signed)
Left message x 2 in regards to needing procedure/surgery that is to be performed as well as need anesthesia.

## 2020-05-07 NOTE — Telephone Encounter (Signed)
Pt is having LUMBAR SPINE RIGHT DIRECT L4 & L5 WITH GENERAL ANESTHESIA

## 2020-05-08 NOTE — Telephone Encounter (Signed)
Pt appt has been moved up to 05/30/20 with Dr. Lovena Le. Per EP scheduler pt did not want to see APP. I will forward clearance notes to MD for upcoming appt.

## 2020-05-08 NOTE — Telephone Encounter (Signed)
   Primary Cardiologist:Gregg Lovena Le, MD  Chart reviewed as part of pre-operative protocol coverage. Because of Ryan [name]'s past medical history and time since last visit, they will require a follow-up visit in order to better assess preoperative cardiovascular risk.  Pre-op covering staff: - Please schedule appointment and call patient to inform them. If patient already had an upcoming appointment within acceptable timeframe, please add "pre-op clearance" to the appointment notes so provider is aware. - Please contact requesting surgeon's office via preferred method (i.e, phone, fax) to inform them of need for appointment prior to surgery.  Please arrange ASAP  Was just in ER with chest pain, recent PNA  If applicable, this message will also be routed to pharmacy pool and/or primary cardiologist for input on holding anticoagulant/antiplatelet agent as requested below so that this information is available to the clearing provider at time of patient's appointment.   Cecilie Kicks, NP  05/08/2020, 9:15 AM

## 2020-05-08 NOTE — Telephone Encounter (Signed)
Message sent to EP scheduler [place] to see if we can get the pt's appt moved up sooner for pre op clearance as well the pt was recently seen in the ED for chest pain.

## 2020-05-09 NOTE — Telephone Encounter (Signed)
Jovanna from Dr. Maryjean Ka office calling stating to fax the patient's clearance to [phone].

## 2020-05-14 NOTE — Telephone Encounter (Signed)
Patient with diagnosis of afib on Eliquis for anticoagulation.    Procedure: lumbar spine right direct L4 and L5 with general anesthesia Date of procedure: 05/31/20  CHADS2-VASc score of 4 (CHF, HTN, age, CAD)  CrCl 83mL/min Platelet count 149K  Per office protocol, patient can hold Eliquis for 3 days prior to procedure.

## 2020-05-14 NOTE — Telephone Encounter (Signed)
Pharmacy can you please comment on holding Eliquis in this patient prior to laminectomy.  His surgery days is 8/27, his office appointment for clearance is 8/26.  Thanks  Kerin Ransom PA-C 05/14/2020 8:32 AM

## 2020-05-14 NOTE — Telephone Encounter (Signed)
Please make sure the patient's office visit on 8/26 is noted as "pre Op Clearance" and let the patient know he can hold his Eliquis 3 days pre op- his surgery is 8/27.  Thanks  Kerin Ransom PA-C 05/14/2020 10:12 AM

## 2020-05-16 DIAGNOSIS — H353231 Exudative age-related macular degeneration, bilateral, with active choroidal neovascularization: Secondary | ICD-10-CM | POA: Diagnosis not present

## 2020-05-16 DIAGNOSIS — Z9842 Cataract extraction status, left eye: Secondary | ICD-10-CM | POA: Diagnosis not present

## 2020-05-16 DIAGNOSIS — Z9889 Other specified postprocedural states: Secondary | ICD-10-CM | POA: Diagnosis not present

## 2020-05-16 DIAGNOSIS — Z9841 Cataract extraction status, right eye: Secondary | ICD-10-CM | POA: Diagnosis not present

## 2020-05-16 DIAGNOSIS — Z961 Presence of intraocular lens: Secondary | ICD-10-CM | POA: Diagnosis not present

## 2020-05-30 ENCOUNTER — Encounter: Payer: Self-pay | Admitting: Internal Medicine

## 2020-05-30 ENCOUNTER — Ambulatory Visit (INDEPENDENT_AMBULATORY_CARE_PROVIDER_SITE_OTHER): Payer: Medicare Other | Admitting: Internal Medicine

## 2020-05-30 ENCOUNTER — Other Ambulatory Visit: Payer: Self-pay

## 2020-05-30 VITALS — BP 146/74 | HR 59 | Ht 64.0 in | Wt 181.2 lb

## 2020-05-30 DIAGNOSIS — I10 Essential (primary) hypertension: Secondary | ICD-10-CM | POA: Diagnosis not present

## 2020-05-30 DIAGNOSIS — I480 Paroxysmal atrial fibrillation: Secondary | ICD-10-CM

## 2020-05-30 NOTE — Progress Notes (Addendum)
HPI Mr. Mcgath returns today for followup. He is a pleasant 74 yo man with a  H/o HTN, PAF, arthritis and diastolic dysfunction. He has preserved LV function by recent echo. He has had minimal palpitations and wears a fit bit which has not demonstrated any sustained atrial fib.  Allergies  Allergen Reactions  . Albumin (Human) Anaphylaxis  . Polymyxin B-Trimethoprim Swelling    Eye drops made eyes swell  . Pseudoephedrine Other (See Comments)    Stomach cramps  . Codeine Hives, Itching and Rash  . Guaiacol Other (See Comments)    Hallucinations  . Statins Other (See Comments)    Muscle cramps   . Meloxicam Other (See Comments)    Unknown reaction  . Pseudoephedrine-Guaifenesin Nausea And Vomiting    Stomach cramps  . Rosuvastatin Calcium Other (See Comments)    Muscle aches  . Tapentadol Other (See Comments)    Unknown reaction  . Ciprofloxacin Hives, Itching, Nausea Only and Rash  . Moxifloxacin Nausea Only and Other (See Comments)    Headaches, stomach cramps   . Rofecoxib Other (See Comments)    Stomach cramping     Current Outpatient Medications  Medication Sig Dispense Refill  . brimonidine (ALPHAGAN) 0.2 % ophthalmic solution Place 1 drop into the left eye in the morning and at bedtime.   7  . Cholecalciferol (VITAMIN D3) 5000 units CAPS Take 5,000 Units by mouth every other day.     . clobetasol (TEMOVATE) 0.05 % external solution APPLY 1 APPLICATION TOPICALLY TWICE DAILY 100 mL 3  . ELIQUIS 5 MG TABS tablet TAKE 1 TABLET BY MOUTH TWO  TIMES DAILY 180 tablet 3  . fluconazole (DIFLUCAN) 100 MG tablet Take 100 mg by mouth as needed (oral flush).    . furosemide (LASIX) 20 MG tablet TAKE 1 TABLET BY MOUTH  DAILY 90 tablet 3  . latanoprost (XALATAN) 0.005 % ophthalmic solution Place 1 drop into the left eye at bedtime.     . levalbuterol (XOPENEX HFA) 45 MCG/ACT inhaler Inhale 2 puffs into the lungs every 4 (four) hours as needed for wheezing. 1 Inhaler 0  .  LORazepam (ATIVAN) 0.5 MG tablet Take 1 tablet (0.5 mg total) by mouth 2 (two) times daily as needed for anxiety. 30 tablet 0  . metoprolol tartrate (LOPRESSOR) 25 MG tablet TAKE 1 TABLET BY MOUTH  TWICE DAILY 180 tablet 3  . Multiple Vitamins-Minerals (ICAPS MV) TABS Take 1 tablet by mouth 2 (two) times daily.     . nitroGLYCERIN (NITROSTAT) 0.4 MG SL tablet Place 1 tablet (0.4 mg total) under the tongue every 5 (five) minutes as needed for chest pain. 30 tablet 0  . omeprazole (PRILOSEC OTC) 20 MG tablet Take 20 mg by mouth daily.    . ondansetron (ZOFRAN) 4 MG tablet Take 1 tablet (4 mg total) by mouth every 6 (six) hours as needed for nausea. 20 tablet 0  . potassium chloride (KLOR-CON) 10 MEQ tablet TAKE 1 TABLET BY MOUTH  DAILY 90 tablet 3  . propafenone (RYTHMOL) 225 MG tablet TAKE 1 TABLET BY MOUTH TWO  TIMES DAILY 180 tablet 2  . Ranibizumab (LUCENTIS IO) Inject 1 Dose into the eye as needed (Macular degeneration).     . tamsulosin (FLOMAX) 0.4 MG CAPS capsule Take 1 capsule (0.4 mg total) by mouth daily. 30 capsule 0   No current facility-administered medications for this visit.     Past Medical History:  Diagnosis Date  .  A-fib ([place]) 03/22/2018  . Anemia   . Atrial flutter ([place]) 03/31/2012   converted spontaneously to sinus  . CAD (coronary artery disease)    reportedly moderate CAD; managed medically  . CHF (congestive heart failure) ([place])   . COPD (chronic obstructive pulmonary disease) ([place])   . Eczema 08/03/2014  . Edema 03/22/2018  . GERD (gastroesophageal reflux disease)   . H/O: rheumatic fever   . Heart murmur   . Heart murmur   . High risk medication use    on amiodarone since 03/31/2012  . History of colonoscopy   . Hyperglycemia 06/21/2017  . Hyperlipidemia   . Hypertension   . Kidney stone 06/21/2017  . Kidney stones   . Lobar pneumonia ([place]) 03/22/2018  . Low vitamin D level 06/21/2017  . Macular degeneration   . Osteoporosis 05/31/2016    ROS:   All  systems reviewed and negative except as noted in the HPI.   Past Surgical History:  Procedure Laterality Date  . APPENDECTOMY    . CARDIAC CATHETERIZATION  04/22/2011   moderate left main and RCA stenosis not significant by FFR and IVUS on medical therapy  . CARDIOVERSION N/A 04/25/2018   Procedure: CARDIOVERSION;  Surgeon: Skeet Latch, MD;  Location: [facility] ENDOSCOPY;  Service: Cardiovascular;  Laterality: N/A;  . CERVICAL DISCECTOMY     C5,6,7 disc fused  with plate and 5 1" screws  . CHOLECYSTECTOMY    . colon polyp removal  3/1.19  . FOOT SURGERY     right calcification removed from top of foot  . knee cartiledge Right    right knee  . MOUTH SURGERY     periodontal surgery, bridges, splint in front,      Family History  Problem Relation Age of Onset  . Heart disease Mother   . Diabetes Mother   . Cirrhosis Mother   . Emphysema Mother        never smoked but 2nd hand through her spouse  . Hypertension Mother   . Macular degeneration Mother   . Heart disease Father   . Cancer Father        prostate  . Hyperlipidemia Father   . Hypertension Father   . Varicose Veins Father   . Heart attack Father   . Peripheral vascular disease Father   . Heart disease Sister   . Arthritis Sister   . Hyperlipidemia Sister   . Obesity Sister   . Macular degeneration Sister   . Heart disease Brother        5 stents  . Hyperlipidemia Brother   . Macular degeneration Maternal Grandfather   . Cirrhosis Sister   . Obesity Sister   . Arthritis Sister   . Heart disease Sister   . Obesity Sister   . Liver disease Other   . Prostate cancer Other   . Coronary artery disease Other      Social History   Socioeconomic History  . Marital status: Married    Spouse name: Not on file  . Number of children: 0  . Years of education: Not on file  . Highest education level: Not on file  Occupational History  . Occupation: retired    Fish farm manager: RETIRED  Tobacco Use  . Smoking status:  Former Smoker    Packs/day: 0.50    Years: 50.00    Pack years: 25.00    Types: Cigarettes    Quit date: 05/01/2013    Years since quitting: 7.0  . Smokeless  tobacco: Former Systems developer  . Tobacco comment: using e-Cig//ldc  Vaping Use  . Vaping Use: Never used  Substance and Sexual Activity  . Alcohol use: No  . Drug use: No  . Sexual activity: Yes    Comment: lives with wife, no dietary restrictions  Other Topics Concern  . Not on file  Social History Narrative  . Not on file   Social Determinants of Health   Financial Resource Strain:   . Difficulty of Paying Living Expenses: Not on file  Food Insecurity:   . Worried About Charity fundraiser in the Last Year: Not on file  . Ran Out of Food in the Last Year: Not on file  Transportation Needs:   . Lack of Transportation (Medical): Not on file  . Lack of Transportation (Non-Medical): Not on file  Physical Activity:   . Days of Exercise per Week: Not on file  . Minutes of Exercise per Session: Not on file  Stress:   . Feeling of Stress : Not on file  Social Connections:   . Frequency of Communication with Friends and Family: Not on file  . Frequency of Social Gatherings with Friends and Family: Not on file  . Attends Religious Services: Not on file  . Active Member of Clubs or Organizations: Not on file  . Attends Archivist Meetings: Not on file  . Marital Status: Not on file  Intimate Partner Violence:   . Fear of Current or Ex-Partner: Not on file  . Emotionally Abused: Not on file  . Physically Abused: Not on file  . Sexually Abused: Not on file     BP (!) 146/74   Pulse (!) 59   Ht 5\' 4"  (1.626 m)   Wt 181 lb 3.2 oz (82.2 kg)   SpO2 96%   BMI 31.10 kg/m   Physical Exam:  Well appearing NAD HEENT: Unremarkable Neck:  No JVD, no thyromegally Lymphatics:  No adenopathy Back:  No CVA tenderness Lungs:  Clear with no wheezes HEART:  Regular rate rhythm, no murmurs, no rubs, no clicks Abd:  soft,  positive bowel sounds, no organomegally, no rebound, no guarding Ext:  2 plus pulses, no edema, no cyanosis, no clubbing Skin:  No rashes no nodules Neuro:  CN II through XII intact, motor grossly intact  EKG - nsr   Assess/Plan: 1.  PAF - he is maintaining NSR. He will continue rhythmol. He will continue Eliquis. 2. Preoperative eval - he is pending spinal injections. He is low risk and may proceed with spinal injection. While the half life of eliquis is 8 hours or less, and there is no need to wait more than 2 days before an invasive procedure, he may hold eliquis for 3-4 days if preferred by his injecting physician. Eliquis can be restarted when ok with the physician performing the procedure. 3. Diastolic dysfunction - he has class 2 symptoms. He is encouraged to maintain a low sodium diet. No indication for a change in his low dose lasix. 4. HTN - his bp is up a bit today. See above.  Carleene Overlie Lenvil Swaim,MD

## 2020-05-30 NOTE — Patient Instructions (Signed)

## 2020-06-03 ENCOUNTER — Telehealth: Payer: Self-pay | Admitting: Internal Medicine

## 2020-06-03 NOTE — Telephone Encounter (Signed)
Dr. Lovena Le, [place] [facility] is calling to see if pt has been cleared. You saw the pt on 05/29/20. I read your note, though would like to confirm if the pt is cleared for their procedure. If cleared I will be happy to fax your ov note to the requesting office. I appreciate all of your help in this matter Dr. Lovena Le.

## 2020-06-03 NOTE — Telephone Encounter (Signed)
   Jovanna from [place] [facility] calling back, she said they have not receive clearance yet please fax it to [phone], attn to [employer]

## 2020-06-03 NOTE — Telephone Encounter (Signed)
Per review of last OV note-Dr. Lovena Le approved Pt for surgery and gave instruction for holding [place].   Advised to fax note to neurology.  No further action needed.

## 2020-06-04 ENCOUNTER — Other Ambulatory Visit: Payer: Self-pay

## 2020-06-04 ENCOUNTER — Ambulatory Visit (HOSPITAL_BASED_OUTPATIENT_CLINIC_OR_DEPARTMENT_OTHER)
Admission: RE | Admit: 2020-06-04 | Discharge: 2020-06-04 | Disposition: A | Discharge status: Home / Self Care | Payer: Medicare Other | Source: Ambulatory Visit | Attending: Family Medicine | Admitting: Family Medicine

## 2020-06-04 ENCOUNTER — Other Ambulatory Visit (INDEPENDENT_AMBULATORY_CARE_PROVIDER_SITE_OTHER): Payer: Medicare Other

## 2020-06-04 DIAGNOSIS — R739 Hyperglycemia, unspecified: Secondary | ICD-10-CM

## 2020-06-04 DIAGNOSIS — I10 Essential (primary) hypertension: Secondary | ICD-10-CM | POA: Diagnosis not present

## 2020-06-04 DIAGNOSIS — R0600 Dyspnea, unspecified: Secondary | ICD-10-CM | POA: Insufficient documentation

## 2020-06-04 DIAGNOSIS — E782 Mixed hyperlipidemia: Secondary | ICD-10-CM

## 2020-06-04 DIAGNOSIS — E559 Vitamin D deficiency, unspecified: Secondary | ICD-10-CM

## 2020-06-04 DIAGNOSIS — J189 Pneumonia, unspecified organism: Secondary | ICD-10-CM | POA: Diagnosis not present

## 2020-06-04 DIAGNOSIS — R0602 Shortness of breath: Secondary | ICD-10-CM | POA: Diagnosis not present

## 2020-06-04 NOTE — Telephone Encounter (Signed)
Clearance notes were routed to requesting office yesterday. I will re-fax today as well to requesting office to fax number [phone] to [facility] per call today.

## 2020-06-04 NOTE — Addendum Note (Signed)
Addended by: Kelle Darting A on: 06/04/2020 07:17 AM   Modules accepted: Orders

## 2020-06-04 NOTE — Telephone Encounter (Signed)
Follow up   Orvil Feil from [facility] called, she said Ryan [name] is not in today and if clearance can be fax to her, fax # [phone] Attn to [facility]

## 2020-06-05 ENCOUNTER — Telehealth: Payer: Self-pay | Admitting: Family Medicine

## 2020-06-05 LAB — LIPID PANEL
Cholesterol: 255 mg/dL — ABNORMAL HIGH (ref <200)
HDL: 40 mg/dL (ref >=40)
LDL Cholesterol (Calc): 192 mg/dL (calc) — ABNORMAL HIGH
Non-HDL Cholesterol (Calc): 215 mg/dL (calc) — ABNORMAL HIGH (ref <130)
Total CHOL/HDL Ratio: 6.4 (calc) — ABNORMAL HIGH (ref <5.0)
Triglycerides: 107 mg/dL (ref <150)

## 2020-06-05 LAB — COMPREHENSIVE METABOLIC PANEL
AG Ratio: 1.4 (calc) (ref 1.0–2.5)
ALT: 12 U/L (ref 9–46)
AST: 17 U/L (ref 10–35)
Albumin: 3.9 g/dL (ref 3.6–5.1)
Alkaline phosphatase (APISO): 58 U/L (ref 35–144)
BUN/Creatinine Ratio: 14 (calc) (ref 6–22)
BUN: 17 mg/dL (ref 7–25)
CO2: 27 mmol/L (ref 20–32)
Calcium: 8.8 mg/dL (ref 8.6–10.3)
Chloride: 104 mmol/L (ref 98–110)
Creat: 1.25 mg/dL — ABNORMAL HIGH (ref 0.70–1.18)
Globulin: 2.7 g/dL (calc) (ref 1.9–3.7)
Glucose, Bld: 98 mg/dL (ref 65–99)
Potassium: 4.4 mmol/L (ref 3.5–5.3)
Sodium: 140 mmol/L (ref 135–146)
Total Bilirubin: 0.6 mg/dL (ref 0.2–1.2)
Total Protein: 6.6 g/dL (ref 6.1–8.1)

## 2020-06-05 LAB — HEMOGLOBIN A1C
Hgb A1c MFr Bld: 5.1 % of total Hgb (ref <5.7)
Mean Plasma Glucose: 100 (calc)
eAG (mmol/L): 5.5 (calc)

## 2020-06-05 LAB — CBC
HCT: 39.7 % (ref 38.5–50.0)
Hemoglobin: 13.4 g/dL (ref 13.2–17.1)
MCH: 31 pg (ref 27.0–33.0)
MCHC: 33.8 g/dL (ref 32.0–36.0)
MCV: 91.9 fL (ref 80.0–100.0)
MPV: 11.2 fL (ref 7.5–12.5)
Platelets: 156 10*3/uL (ref 140–400)
RBC: 4.32 10*6/uL (ref 4.20–5.80)
RDW: 13 % (ref 11.0–15.0)
WBC: 7.7 10*3/uL (ref 3.8–10.8)

## 2020-06-05 LAB — VITAMIN D 25 HYDROXY (VIT D DEFICIENCY, FRACTURES): Vit D, 25-Hydroxy: 37 ng/mL (ref 30–100)

## 2020-06-05 LAB — TSH: TSH: 3.43 mIU/L (ref 0.40–4.50)

## 2020-06-05 MED ORDER — LEVALBUTEROL TARTRATE 45 MCG/ACT IN AERO: 2.0000 | INHALATION_SPRAY | RESPIRATORY_TRACT | 2 refills | Status: DC | PRN

## 2020-06-05 NOTE — Telephone Encounter (Signed)
Rxx sent

## 2020-06-05 NOTE — Telephone Encounter (Signed)
Medication: levalbuterol (XOPENEX HFA) 45 MCG/ACT inhaler   Has the patient contacted their pharmacy? Yes.   (If no, request that the patient contact the pharmacy for the refill.) (If yes, when and what did the pharmacy advise?) Stated they sent in on yesterday   Preferred Pharmacy (with phone number or street name):  [id number] - [place], [address]  [place], [place] [address]  Phone:  [phone] Fax:  [phone]  Agent: Please be advised that RX refills may take up to 3 business days. We ask that you follow-up with your pharmacy.

## 2020-06-07 DIAGNOSIS — M5416 Radiculopathy, lumbar region: Secondary | ICD-10-CM | POA: Diagnosis not present

## 2020-06-07 DIAGNOSIS — M5136 Other intervertebral disc degeneration, lumbar region: Secondary | ICD-10-CM | POA: Diagnosis not present

## 2020-06-07 DIAGNOSIS — M48062 Spinal stenosis, lumbar region with neurogenic claudication: Secondary | ICD-10-CM | POA: Diagnosis not present

## 2020-06-11 ENCOUNTER — Ambulatory Visit: Payer: Medicare Other | Admitting: Family Medicine

## 2020-06-11 ENCOUNTER — Other Ambulatory Visit: Payer: Self-pay

## 2020-06-11 DIAGNOSIS — E559 Vitamin D deficiency, unspecified: Secondary | ICD-10-CM

## 2020-06-12 NOTE — Progress Notes (Signed)
Patient not seen.

## 2020-06-20 DIAGNOSIS — H353231 Exudative age-related macular degeneration, bilateral, with active choroidal neovascularization: Secondary | ICD-10-CM | POA: Diagnosis not present

## 2020-06-23 ENCOUNTER — Other Ambulatory Visit: Payer: Self-pay | Admitting: Internal Medicine

## 2020-06-23 DIAGNOSIS — I4891 Unspecified atrial fibrillation: Secondary | ICD-10-CM

## 2020-06-24 ENCOUNTER — Other Ambulatory Visit: Payer: Self-pay

## 2020-06-24 ENCOUNTER — Ambulatory Visit (INDEPENDENT_AMBULATORY_CARE_PROVIDER_SITE_OTHER): Payer: Medicare Other | Admitting: Family Medicine

## 2020-06-24 VITALS — BP 126/67 | HR 67 | Temp 98.2°F | Resp 13 | Ht 60.0 in | Wt 178.6 lb

## 2020-06-24 DIAGNOSIS — K219 Gastro-esophageal reflux disease without esophagitis: Secondary | ICD-10-CM

## 2020-06-24 DIAGNOSIS — E559 Vitamin D deficiency, unspecified: Secondary | ICD-10-CM

## 2020-06-24 DIAGNOSIS — R7989 Other specified abnormal findings of blood chemistry: Secondary | ICD-10-CM | POA: Diagnosis not present

## 2020-06-24 DIAGNOSIS — R739 Hyperglycemia, unspecified: Secondary | ICD-10-CM

## 2020-06-24 DIAGNOSIS — Z789 Other specified health status: Secondary | ICD-10-CM | POA: Diagnosis not present

## 2020-06-24 DIAGNOSIS — Z23 Encounter for immunization: Secondary | ICD-10-CM

## 2020-06-24 DIAGNOSIS — E782 Mixed hyperlipidemia: Secondary | ICD-10-CM | POA: Diagnosis not present

## 2020-06-24 DIAGNOSIS — I5032 Chronic diastolic (congestive) heart failure: Secondary | ICD-10-CM | POA: Diagnosis not present

## 2020-06-24 DIAGNOSIS — R609 Edema, unspecified: Secondary | ICD-10-CM | POA: Diagnosis not present

## 2020-06-24 DIAGNOSIS — I10 Essential (primary) hypertension: Secondary | ICD-10-CM

## 2020-06-24 NOTE — Assessment & Plan Note (Signed)
Supplement and monitor 

## 2020-06-24 NOTE — Telephone Encounter (Signed)
Prescription refill request for Eliquis received. Indication:a fib Last office visit: 05/30/20 Scr:1.25 Age: 74 Weight: 81 kg

## 2020-06-24 NOTE — Assessment & Plan Note (Signed)
Greatly improved on Lasix 20 mg daily

## 2020-06-24 NOTE — Progress Notes (Signed)
Subjective:    Patient ID: Ryan [name], male    DOB: Nov 14, 1945, 74 y.o.   MRN: [id number]  Chief Complaint  Patient presents with  . Follow-up    HPI Patient is in today for follow up on chronic medical concerns. He feels well. No recent febrile illness or hospitalizations. No polyuria or polydipsia although he is having some trouble with his urine stream since urology held his Flomax for now. He is going to follow up with urology. Denies CP/palp/SOB/HA/congestion/fevers/GI c/o. Taking meds as prescribed  Past Medical History:  Diagnosis Date  . A-fib ([place]) 03/22/2018  . Anemia   . Atrial flutter ([place]) 03/31/2012   converted spontaneously to sinus  . CAD (coronary artery disease)    reportedly moderate CAD; managed medically  . CHF (congestive heart failure) ([place])   . COPD (chronic obstructive pulmonary disease) ([place])   . Eczema 08/03/2014  . Edema 03/22/2018  . GERD (gastroesophageal reflux disease)   . H/O: rheumatic fever   . Heart murmur   . Heart murmur   . High risk medication use    on amiodarone since 03/31/2012  . History of colonoscopy   . Hyperglycemia 06/21/2017  . Hyperlipidemia   . Hypertension   . Kidney stone 06/21/2017  . Kidney stones   . Lobar pneumonia ([place]) 03/22/2018  . Low vitamin D level 06/21/2017  . Macular degeneration   . Osteoporosis 05/31/2016    Past Surgical History:  Procedure Laterality Date  . APPENDECTOMY    . CARDIAC CATHETERIZATION  04/22/2011   moderate left main and RCA stenosis not significant by FFR and IVUS on medical therapy  . CARDIOVERSION N/A 04/25/2018   Procedure: CARDIOVERSION;  Surgeon: Skeet Latch, MD;  Location: [facility] ENDOSCOPY;  Service: Cardiovascular;  Laterality: N/A;  . CERVICAL DISCECTOMY     C5,6,7 disc fused  with plate and 5 1" screws  . CHOLECYSTECTOMY    . colon polyp removal  3/1.19  . FOOT SURGERY     right calcification removed from top of foot  . knee cartiledge Right    right knee  . MOUTH  SURGERY     periodontal surgery, bridges, splint in front,     Family History  Problem Relation Age of Onset  . Heart disease Mother   . Diabetes Mother   . Cirrhosis Mother   . Emphysema Mother        never smoked but 2nd hand through her spouse  . Hypertension Mother   . Macular degeneration Mother   . Heart disease Father   . Cancer Father        prostate  . Hyperlipidemia Father   . Hypertension Father   . Varicose Veins Father   . Heart attack Father   . Peripheral vascular disease Father   . Heart disease Sister   . Arthritis Sister   . Hyperlipidemia Sister   . Obesity Sister   . Macular degeneration Sister   . Heart disease Brother        5 stents  . Hyperlipidemia Brother   . Macular degeneration Maternal Grandfather   . Cirrhosis Sister   . Obesity Sister   . Arthritis Sister   . Heart disease Sister   . Obesity Sister   . Liver disease Other   . Prostate cancer Other   . Coronary artery disease Other     Social History   Socioeconomic History  . Marital status: Married    Spouse name: Not  on file  . Number of children: 0  . Years of education: Not on file  . Highest education level: Not on file  Occupational History  . Occupation: retired    Fish farm manager: RETIRED  Tobacco Use  . Smoking status: Former Smoker    Packs/day: 0.50    Years: 50.00    Pack years: 25.00    Types: Cigarettes    Quit date: 05/01/2013    Years since quitting: 7.1  . Smokeless tobacco: Former Systems developer  . Tobacco comment: using e-Cig//ldc  Vaping Use  . Vaping Use: Never used  Substance and Sexual Activity  . Alcohol use: No  . Drug use: No  . Sexual activity: Yes    Comment: lives with wife, no dietary restrictions  Other Topics Concern  . Not on file  Social History Narrative  . Not on file   Social Determinants of Health   Financial Resource Strain:   . Difficulty of Paying Living Expenses: Not on file  Food Insecurity:   . Worried About Charity fundraiser in  the Last Year: Not on file  . Ran Out of Food in the Last Year: Not on file  Transportation Needs:   . Lack of Transportation (Medical): Not on file  . Lack of Transportation (Non-Medical): Not on file  Physical Activity:   . Days of Exercise per Week: Not on file  . Minutes of Exercise per Session: Not on file  Stress:   . Feeling of Stress : Not on file  Social Connections:   . Frequency of Communication with Friends and Family: Not on file  . Frequency of Social Gatherings with Friends and Family: Not on file  . Attends Religious Services: Not on file  . Active Member of Clubs or Organizations: Not on file  . Attends Archivist Meetings: Not on file  . Marital Status: Not on file  Intimate Partner Violence:   . Fear of Current or Ex-Partner: Not on file  . Emotionally Abused: Not on file  . Physically Abused: Not on file  . Sexually Abused: Not on file    Outpatient Medications Prior to Visit  Medication Sig Dispense Refill  . brimonidine (ALPHAGAN) 0.2 % ophthalmic solution Place 1 drop into the left eye in the morning and at bedtime.   7  . Cholecalciferol (VITAMIN D3) 5000 units CAPS Take 5,000 Units by mouth every other day.     . clobetasol (TEMOVATE) 0.05 % external solution APPLY 1 APPLICATION TOPICALLY TWICE DAILY 100 mL 3  . ELIQUIS 5 MG TABS tablet TAKE 1 TABLET BY MOUTH  TWICE DAILY 180 tablet 3  . fluconazole (DIFLUCAN) 100 MG tablet Take 100 mg by mouth as needed (oral flush).    . furosemide (LASIX) 20 MG tablet TAKE 1 TABLET BY MOUTH  DAILY 90 tablet 3  . latanoprost (XALATAN) 0.005 % ophthalmic solution Place 1 drop into the left eye at bedtime.     . levalbuterol (XOPENEX HFA) 45 MCG/ACT inhaler Inhale 2 puffs into the lungs every 4 (four) hours as needed for wheezing. 45 g 2  . metoprolol tartrate (LOPRESSOR) 25 MG tablet TAKE 1 TABLET BY MOUTH  TWICE DAILY 180 tablet 3  . Multiple Vitamins-Minerals (ICAPS MV) TABS Take 1 tablet by mouth 2 (two) times  daily.     . nitroGLYCERIN (NITROSTAT) 0.4 MG SL tablet Place 1 tablet (0.4 mg total) under the tongue every 5 (five) minutes as needed for chest pain. 30 tablet  0  . omeprazole (PRILOSEC OTC) 20 MG tablet Take 20 mg by mouth daily.    . potassium chloride (KLOR-CON) 10 MEQ tablet TAKE 1 TABLET BY MOUTH  DAILY 90 tablet 3  . propafenone (RYTHMOL) 225 MG tablet TAKE 1 TABLET BY MOUTH  TWICE DAILY 180 tablet 2  . Ranibizumab (LUCENTIS IO) Inject 1 Dose into the eye as needed (Macular degeneration).      No facility-administered medications prior to visit.    Allergies  Allergen Reactions  . Albumin (Human) Anaphylaxis  . Polymyxin B-Trimethoprim Swelling    Eye drops made eyes swell  . Pseudoephedrine Other (See Comments)    Stomach cramps  . Codeine Hives, Itching and Rash  . Guaiacol Other (See Comments)    Hallucinations  . Statins Other (See Comments)    Muscle cramps   . Meloxicam Other (See Comments)    Unknown reaction  . Pseudoephedrine-Guaifenesin Nausea And Vomiting    Stomach cramps  . Rosuvastatin Calcium Other (See Comments)    Muscle aches  . Tapentadol Other (See Comments)    Unknown reaction  . Ciprofloxacin Hives, Itching, Nausea Only and Rash  . Moxifloxacin Nausea Only and Other (See Comments)    Headaches, stomach cramps   . Rofecoxib Other (See Comments)    Stomach cramping    Review of Systems  Constitutional: Negative for fever and malaise/fatigue.  HENT: Negative for congestion.   Eyes: Negative for blurred vision.  Respiratory: Negative for shortness of breath.   Cardiovascular: Negative for chest pain, palpitations and leg swelling.  Gastrointestinal: Negative for abdominal pain, blood in stool and nausea.  Genitourinary: Negative for dysuria and frequency.  Musculoskeletal: Negative for falls.  Skin: Negative for rash.  Neurological: Negative for dizziness, loss of consciousness and headaches.  Endo/Heme/Allergies: Negative for environmental  allergies.  Psychiatric/Behavioral: Negative for depression. The patient is not nervous/anxious.        Objective:    Physical Exam Vitals and nursing note reviewed.  Constitutional:      General: He is not in acute distress.    Appearance: He is well-developed.  HENT:     Head: Normocephalic and atraumatic.     Nose: Nose normal.  Eyes:     General:        Right eye: No discharge.        Left eye: No discharge.  Cardiovascular:     Rate and Rhythm: Normal rate and regular rhythm.     Heart sounds: Murmur heard.   Pulmonary:     Effort: Pulmonary effort is normal.     Breath sounds: Normal breath sounds.  Abdominal:     General: Bowel sounds are normal.     Palpations: Abdomen is soft.     Tenderness: There is no abdominal tenderness.  Musculoskeletal:     Cervical back: Normal range of motion and neck supple.  Skin:    General: Skin is warm and dry.  Neurological:     Mental Status: He is alert and oriented to person, place, and time.     BP 126/67 (BP Location: Left Arm, Patient Position: Sitting, Cuff Size: Large)   Pulse 67   Temp 98.2 F (36.8 C) (Oral)   Resp 13   Ht 5' (1.524 m)   Wt 178 lb 9.6 oz (81 kg)   SpO2 97%   BMI 34.88 kg/m  Wt Readings from Last 3 Encounters:  06/24/20 178 lb 9.6 oz (81 kg)  06/11/20 180 lb 6.4  oz (81.8 kg)  05/30/20 181 lb 3.2 oz (82.2 kg)    Diabetic Foot Exam - Simple   No data filed     Lab Results  Component Value Date   WBC 7.7 06/04/2020   HGB 13.4 06/04/2020   HCT 39.7 06/04/2020   PLT 156 06/04/2020   GLUCOSE 98 06/04/2020   CHOL 255 (H) 06/04/2020   TRIG 107 06/04/2020   HDL 40 06/04/2020   LDLDIRECT 134.2 12/01/2013   LDLCALC 192 (H) 06/04/2020   ALT 12 06/04/2020   AST 17 06/04/2020   NA 140 06/04/2020   K 4.4 06/04/2020   CL 104 06/04/2020   CREATININE 1.25 (H) 06/04/2020   BUN 17 06/04/2020   CO2 27 06/04/2020   TSH 3.43 06/04/2020   PSA 1.64 01/14/2015   INR 1.2 05/02/2020   HGBA1C 5.1  06/04/2020    Lab Results  Component Value Date   TSH 3.43 06/04/2020   Lab Results  Component Value Date   WBC 7.7 06/04/2020   HGB 13.4 06/04/2020   HCT 39.7 06/04/2020   MCV 91.9 06/04/2020   PLT 156 06/04/2020   Lab Results  Component Value Date   NA 140 06/04/2020   K 4.4 06/04/2020   CO2 27 06/04/2020   GLUCOSE 98 06/04/2020   BUN 17 06/04/2020   CREATININE 1.25 (H) 06/04/2020   BILITOT 0.6 06/04/2020   ALKPHOS 58 04/22/2020   AST 17 06/04/2020   ALT 12 06/04/2020   PROT 6.6 06/04/2020   ALBUMIN 4.0 04/22/2020   CALCIUM 8.8 06/04/2020   ANIONGAP 9 05/02/2020   GFR 49.23 (L) 04/22/2020   Lab Results  Component Value Date   CHOL 255 (H) 06/04/2020   Lab Results  Component Value Date   HDL 40 06/04/2020   Lab Results  Component Value Date   LDLCALC 192 (H) 06/04/2020   Lab Results  Component Value Date   TRIG 107 06/04/2020   Lab Results  Component Value Date   CHOLHDL 6.4 (H) 06/04/2020   Lab Results  Component Value Date   HGBA1C 5.1 06/04/2020       Assessment & Plan:   Problem List Items Addressed This Visit    Hyperlipidemia, mixed   Relevant Orders   Lipid panel   Essential hypertension    Well controlled, no changes to meds. Encouraged heart healthy diet such as the DASH diet and exercise as tolerated.       Relevant Orders   CBC   Comprehensive metabolic panel   TSH   Comprehensive metabolic panel   GERD    Avoid offending foods, start probiotics. Do not eat large meals in late evening and consider raising head of bed.       Low vitamin D level    Supplement and monitor      Hyperglycemia    hgba1c acceptable, minimize simple carbs. Increase exercise as tolerated.       Relevant Orders   Hemoglobin A1c   CHF (congestive heart failure) (HCC)    No recent exacerbation, no changes      Edema    Greatly improved on Lasix 20 mg daily      Vitamin D deficiency   Relevant Orders   VITAMIN D 25 Hydroxy (Vit-D  Deficiency, Fractures)   Statin intolerance    Unable to tolerate statins and he has tried numerous statins. Will continue to monitor and discussed possibility of referring him to hyperlipidemia clinic but will hold off for  now.       Hypomagnesemia    Supplement and monitor      Relevant Orders   Magnesium    Other Visit Diagnoses    Influenza vaccine needed    -  Primary   Relevant Orders   Flu Vaccine QUAD High Dose(Fluad) (Completed)      I am having Kempton L. Carlota Raspberry "Milbert Coulter" maintain his ICaps MV, omeprazole, Ranibizumab (LUCENTIS IO), latanoprost, clobetasol, brimonidine, Vitamin D3, furosemide, potassium chloride, nitroGLYCERIN, fluconazole, levalbuterol, metoprolol tartrate, propafenone, and Eliquis.  No orders of the defined types were placed in this encounter.    Penni Homans, MD

## 2020-06-24 NOTE — Assessment & Plan Note (Signed)
Avoid offending foods, start probiotics. Do not eat large meals in late evening and consider raising head of bed.  

## 2020-06-24 NOTE — Assessment & Plan Note (Signed)
No recent exacerbation, no changes

## 2020-06-24 NOTE — Assessment & Plan Note (Signed)
hgba1c acceptable, minimize simple carbs. Increase exercise as tolerated.  

## 2020-06-24 NOTE — Assessment & Plan Note (Signed)
Well controlled, no changes to meds. Encouraged heart healthy diet such as the DASH diet and exercise as tolerated.  °

## 2020-06-24 NOTE — Patient Instructions (Signed)

## 2020-06-24 NOTE — Assessment & Plan Note (Signed)
Unable to tolerate statins and he has tried numerous statins. Will continue to monitor and discussed possibility of referring him to hyperlipidemia clinic but will hold off for now.

## 2020-06-25 DIAGNOSIS — M5136 Other intervertebral disc degeneration, lumbar region: Secondary | ICD-10-CM | POA: Diagnosis not present

## 2020-06-25 DIAGNOSIS — M5416 Radiculopathy, lumbar region: Secondary | ICD-10-CM | POA: Diagnosis not present

## 2020-06-25 DIAGNOSIS — M48062 Spinal stenosis, lumbar region with neurogenic claudication: Secondary | ICD-10-CM | POA: Diagnosis not present

## 2020-06-25 LAB — COMPREHENSIVE METABOLIC PANEL
AG Ratio: 1.5 (calc) (ref 1.0–2.5)
ALT: 14 U/L (ref 9–46)
AST: 14 U/L (ref 10–35)
Albumin: 4.1 g/dL (ref 3.6–5.1)
Alkaline phosphatase (APISO): 55 U/L (ref 35–144)
BUN/Creatinine Ratio: 20 (calc) (ref 6–22)
BUN: 29 mg/dL — ABNORMAL HIGH (ref 7–25)
CO2: 26 mmol/L (ref 20–32)
Calcium: 9.5 mg/dL (ref 8.6–10.3)
Chloride: 104 mmol/L (ref 98–110)
Creat: 1.48 mg/dL — ABNORMAL HIGH (ref 0.70–1.18)
Globulin: 2.8 g/dL (calc) (ref 1.9–3.7)
Glucose, Bld: 99 mg/dL (ref 65–99)
Potassium: 5.1 mmol/L (ref 3.5–5.3)
Sodium: 140 mmol/L (ref 135–146)
Total Bilirubin: 0.6 mg/dL (ref 0.2–1.2)
Total Protein: 6.9 g/dL (ref 6.1–8.1)

## 2020-07-02 ENCOUNTER — Ambulatory Visit: Payer: Medicare Other | Admitting: Internal Medicine

## 2020-07-04 DIAGNOSIS — H6123 Impacted cerumen, bilateral: Secondary | ICD-10-CM | POA: Diagnosis not present

## 2020-07-08 DIAGNOSIS — N401 Enlarged prostate with lower urinary tract symptoms: Secondary | ICD-10-CM | POA: Diagnosis not present

## 2020-07-08 DIAGNOSIS — R351 Nocturia: Secondary | ICD-10-CM | POA: Diagnosis not present

## 2020-07-24 DIAGNOSIS — Z23 Encounter for immunization: Secondary | ICD-10-CM | POA: Diagnosis not present

## 2020-07-25 DIAGNOSIS — H35363 Drusen (degenerative) of macula, bilateral: Secondary | ICD-10-CM | POA: Diagnosis not present

## 2020-07-25 DIAGNOSIS — H353231 Exudative age-related macular degeneration, bilateral, with active choroidal neovascularization: Secondary | ICD-10-CM | POA: Diagnosis not present

## 2020-07-25 DIAGNOSIS — H3589 Other specified retinal disorders: Secondary | ICD-10-CM | POA: Diagnosis not present

## 2020-07-29 ENCOUNTER — Telehealth: Payer: Self-pay

## 2020-07-29 NOTE — Telephone Encounter (Signed)
Nurse Assessment Nurse: Claiborne Billings, RN, Kim Date/Time (Eastern Time): 07/28/2020 9:50:17 AM Confirm and document reason for call. If symptomatic, describe symptoms. ---Caller states her husband received the [place] booster vaccine on Wednesday; this morning woke up with diarrhea, fatigue and weakness. No fever or vomiting. Does the patient have any new or worsening symptoms? ---Yes Will a triage be completed? ---Yes Related visit to physician within the last 2 weeks? ---Yes Does the PT have any chronic conditions? (i.e. diabetes, asthma, this includes High risk factors for pregnancy, etc.) ---Yes Is this a behavioral health or substance abuse call? ---No Guidelines Guideline Title Affirmed Question Affirmed Notes Nurse Date/Time (Eastern Time) COVID-19 - Vaccine Questions and Reactions COVID-19 vaccine, systemic reactions (e.g., fatigue, fever, muscle aches), questions about Claiborne Billings, RN, Maudie Mercury 07/28/2020 9:52:19 AM Diarrhea [1] MODERATE diarrhea (e.g., 4-6 times / day more than normal) AND [2] age > 56 years Suzette Battiest 07/28/2020 9:54:44 AM PLEASE NOTE: All timestamps contained within this report are represented as [place] Standard Time. CONFIDENTIALTY NOTICE: This fax transmission is intended only for the addressee. It contains information that is legally privileged, confidential or otherwise protected from use or disclosure. If you are not the intended recipient, you are strictly prohibited from reviewing, disclosing, copying using or disseminating any of this information or taking any action in reliance on or regarding this information. If you have received this fax in error, please notify us immediately by telephone so that we can arrange for its return to [place]. Phone: [phone], Toll-Free: [phone], Fax: [phone] Page: 2 of 2 Call Id: [id number] [place]. Time Eilene Ghazi Time) Disposition Final User 07/28/2020 9:53:18 [place], RN, Maudie Mercury 07/28/2020 9:57:50 AM See  PCP within 24 Hours Yes Claiborne Billings, RN, Max Sane Disagree/Comply Comply Caller Understands Yes PreDisposition Did not know what to do Care Advice Given Per Guideline HOME CARE: * You should be able to treat this at home. COVID-19 VACCINE - COMMON REACTIONS: * Symptoms usually last 1 to 2 days. CALL BACK IF: * You become worse CARE ADVICE given per COVID-19 - Vaccine Questions and Reactions (Adult) guideline. SEE PCP WITHIN 24 HOURS: * IF OFFICE WILL BE OPEN: You need to be examined within the next 24 hours. Call your doctor (or NP/PA) when the office opens and make an appointment. FLUID THERAPY DURING SEVERE DIARRHEA: * Drink more fluids, at least 8 to 10 cups daily. One cup equals 8 oz (240 ml). * WATER: For mild to moderate diarrhea, water is often the best liquid to drink. You should also eat some salty foods (e.g., potato chips, pretzels, saltine crackers). This is important to make sure you are getting enough salt, sugars, and fluids to meet your body's needs. * SPORTS DRINKS: You can also drink half-strength sports drinks (e.g., Gatorade, Powerade) to help treat and prevent dehydration. Mix the sports drink half and half with water. * AVOID caffeinated beverages. Reason: Caffeine is mildly dehydrating. FOOD AND NUTRITION DURING SEVERE DIARRHEA: * Drinking enough liquids is more important than eating when one has severe diarrhea. CALL BACK IF: * Signs of dehydration occur (e.g., no urine over 12 hours, very dry mouth, lightheaded, etc.) * Bloody stools * Constant or severe abdominal pain * You become worse CARE ADVICE given per Diarrhea (Adult) guideline. Referrals REFERRED TO PCP OFFICE

## 2020-08-01 DIAGNOSIS — R197 Diarrhea, unspecified: Secondary | ICD-10-CM | POA: Diagnosis not present

## 2020-08-06 DIAGNOSIS — R197 Diarrhea, unspecified: Secondary | ICD-10-CM | POA: Diagnosis not present

## 2020-08-20 DIAGNOSIS — R197 Diarrhea, unspecified: Secondary | ICD-10-CM | POA: Diagnosis not present

## 2020-08-26 ENCOUNTER — Other Ambulatory Visit: Payer: Self-pay

## 2020-08-26 ENCOUNTER — Inpatient Hospital Stay (HOSPITAL_COMMUNITY)
Admission: EM | Admit: 2020-08-26 | Discharge: 2020-08-29 | DRG: 641 | Disposition: A | Discharge status: Home / Self Care | Payer: Medicare Other | Attending: Internal Medicine | Admitting: Internal Medicine

## 2020-08-26 ENCOUNTER — Emergency Department (HOSPITAL_COMMUNITY): Payer: Medicare Other

## 2020-08-26 DIAGNOSIS — J439 Emphysema, unspecified: Secondary | ICD-10-CM | POA: Diagnosis present

## 2020-08-26 DIAGNOSIS — Z885 Allergy status to narcotic agent status: Secondary | ICD-10-CM

## 2020-08-26 DIAGNOSIS — Z8249 Family history of ischemic heart disease and other diseases of the circulatory system: Secondary | ICD-10-CM

## 2020-08-26 DIAGNOSIS — Z20822 Contact with and (suspected) exposure to covid-19: Secondary | ICD-10-CM | POA: Diagnosis not present

## 2020-08-26 DIAGNOSIS — I5032 Chronic diastolic (congestive) heart failure: Secondary | ICD-10-CM | POA: Diagnosis not present

## 2020-08-26 DIAGNOSIS — R0789 Other chest pain: Secondary | ICD-10-CM | POA: Diagnosis present

## 2020-08-26 DIAGNOSIS — R011 Cardiac murmur, unspecified: Secondary | ICD-10-CM | POA: Diagnosis present

## 2020-08-26 DIAGNOSIS — Z9049 Acquired absence of other specified parts of digestive tract: Secondary | ICD-10-CM

## 2020-08-26 DIAGNOSIS — R609 Edema, unspecified: Secondary | ICD-10-CM | POA: Diagnosis present

## 2020-08-26 DIAGNOSIS — Z7901 Long term (current) use of anticoagulants: Secondary | ICD-10-CM

## 2020-08-26 DIAGNOSIS — I10 Essential (primary) hypertension: Secondary | ICD-10-CM | POA: Diagnosis present

## 2020-08-26 DIAGNOSIS — F4320 Adjustment disorder, unspecified: Secondary | ICD-10-CM | POA: Diagnosis present

## 2020-08-26 DIAGNOSIS — I739 Peripheral vascular disease, unspecified: Secondary | ICD-10-CM | POA: Diagnosis present

## 2020-08-26 DIAGNOSIS — G4733 Obstructive sleep apnea (adult) (pediatric): Secondary | ICD-10-CM | POA: Diagnosis present

## 2020-08-26 DIAGNOSIS — R001 Bradycardia, unspecified: Secondary | ICD-10-CM | POA: Diagnosis present

## 2020-08-26 DIAGNOSIS — E785 Hyperlipidemia, unspecified: Secondary | ICD-10-CM | POA: Diagnosis present

## 2020-08-26 DIAGNOSIS — I509 Heart failure, unspecified: Secondary | ICD-10-CM

## 2020-08-26 DIAGNOSIS — J811 Chronic pulmonary edema: Secondary | ICD-10-CM | POA: Diagnosis not present

## 2020-08-26 DIAGNOSIS — I482 Chronic atrial fibrillation, unspecified: Secondary | ICD-10-CM | POA: Diagnosis not present

## 2020-08-26 DIAGNOSIS — Z87891 Personal history of nicotine dependence: Secondary | ICD-10-CM

## 2020-08-26 DIAGNOSIS — E559 Vitamin D deficiency, unspecified: Secondary | ICD-10-CM | POA: Diagnosis present

## 2020-08-26 DIAGNOSIS — Z881 Allergy status to other antibiotic agents status: Secondary | ICD-10-CM

## 2020-08-26 DIAGNOSIS — R197 Diarrhea, unspecified: Secondary | ICD-10-CM | POA: Diagnosis not present

## 2020-08-26 DIAGNOSIS — I110 Hypertensive heart disease with heart failure: Secondary | ICD-10-CM | POA: Diagnosis not present

## 2020-08-26 DIAGNOSIS — Z79899 Other long term (current) drug therapy: Secondary | ICD-10-CM

## 2020-08-26 DIAGNOSIS — K219 Gastro-esophageal reflux disease without esophagitis: Secondary | ICD-10-CM | POA: Diagnosis present

## 2020-08-26 DIAGNOSIS — R112 Nausea with vomiting, unspecified: Secondary | ICD-10-CM | POA: Diagnosis present

## 2020-08-26 DIAGNOSIS — Z888 Allergy status to other drugs, medicaments and biological substances status: Secondary | ICD-10-CM

## 2020-08-26 DIAGNOSIS — H3530 Unspecified macular degeneration: Secondary | ICD-10-CM | POA: Diagnosis present

## 2020-08-26 DIAGNOSIS — I2510 Atherosclerotic heart disease of native coronary artery without angina pectoris: Secondary | ICD-10-CM | POA: Diagnosis present

## 2020-08-26 DIAGNOSIS — F4322 Adjustment disorder with anxiety: Secondary | ICD-10-CM | POA: Diagnosis present

## 2020-08-26 DIAGNOSIS — F419 Anxiety disorder, unspecified: Secondary | ICD-10-CM | POA: Diagnosis present

## 2020-08-26 DIAGNOSIS — R531 Weakness: Secondary | ICD-10-CM | POA: Diagnosis present

## 2020-08-26 DIAGNOSIS — C3490 Malignant neoplasm of unspecified part of unspecified bronchus or lung: Secondary | ICD-10-CM | POA: Diagnosis not present

## 2020-08-26 DIAGNOSIS — I420 Dilated cardiomyopathy: Secondary | ICD-10-CM | POA: Diagnosis not present

## 2020-08-26 DIAGNOSIS — J449 Chronic obstructive pulmonary disease, unspecified: Secondary | ICD-10-CM | POA: Diagnosis present

## 2020-08-26 DIAGNOSIS — M810 Age-related osteoporosis without current pathological fracture: Secondary | ICD-10-CM | POA: Diagnosis present

## 2020-08-26 DIAGNOSIS — Z981 Arthrodesis status: Secondary | ICD-10-CM

## 2020-08-26 DIAGNOSIS — E782 Mixed hyperlipidemia: Secondary | ICD-10-CM | POA: Diagnosis present

## 2020-08-26 DIAGNOSIS — E876 Hypokalemia: Secondary | ICD-10-CM | POA: Diagnosis not present

## 2020-08-26 DIAGNOSIS — N400 Enlarged prostate without lower urinary tract symptoms: Secondary | ICD-10-CM | POA: Diagnosis present

## 2020-08-26 DIAGNOSIS — K529 Noninfective gastroenteritis and colitis, unspecified: Secondary | ICD-10-CM | POA: Diagnosis present

## 2020-08-26 LAB — BASIC METABOLIC PANEL
Anion gap: 12 (ref 5–15)
BUN: 12 mg/dL (ref 8–23)
CO2: 25 mmol/L (ref 22–32)
Calcium: 6.7 mg/dL — ABNORMAL LOW (ref 8.9–10.3)
Chloride: 102 mmol/L (ref 98–111)
Creatinine, Ser: 1.3 mg/dL — ABNORMAL HIGH (ref 0.61–1.24)
GFR, Estimated: 58 mL/min — ABNORMAL LOW (ref >60)
Glucose, Bld: 112 mg/dL — ABNORMAL HIGH (ref 70–99)
Potassium: 2.9 mmol/L — ABNORMAL LOW (ref 3.5–5.1)
Sodium: 139 mmol/L (ref 135–145)

## 2020-08-26 LAB — CBC
HCT: 36.7 % — ABNORMAL LOW (ref 39.0–52.0)
Hemoglobin: 12.1 g/dL — ABNORMAL LOW (ref 13.0–17.0)
MCH: 31 pg (ref 26.0–34.0)
MCHC: 33 g/dL (ref 30.0–36.0)
MCV: 94.1 fL (ref 80.0–100.0)
Platelets: 211 10*3/uL (ref 150–400)
RBC: 3.9 MIL/uL — ABNORMAL LOW (ref 4.22–5.81)
RDW: 13.8 % (ref 11.5–15.5)
WBC: 10 10*3/uL (ref 4.0–10.5)
nRBC: 0 % (ref 0.0–0.2)

## 2020-08-26 LAB — MAGNESIUM: Magnesium: 0.4 mg/dL — CL (ref 1.7–2.4)

## 2020-08-26 LAB — TROPONIN I (HIGH SENSITIVITY)
Troponin I (High Sensitivity): 15 ng/L (ref <18)
Troponin I (High Sensitivity): 13 ng/L (ref <18)

## 2020-08-26 LAB — PROTIME-INR
INR: 1.5 — ABNORMAL HIGH (ref 0.8–1.2)
Prothrombin Time: 17.5 seconds — ABNORMAL HIGH (ref 11.4–15.2)

## 2020-08-26 MED ORDER — APIXABAN 5 MG PO TABS: 5.0000 mg | ORAL_TABLET | Freq: Two times a day (BID) | ORAL | Status: DC

## 2020-08-26 MED ORDER — BRIMONIDINE TARTRATE 0.2 % OP SOLN
1.0000 [drp] | Freq: Two times a day (BID) | OPHTHALMIC | Status: DC
  Administered 2020-08-27 – 2020-08-28 (×5): 1 [drp] via OPHTHALMIC
  Filled 2020-08-26: qty 5

## 2020-08-26 MED ORDER — FUROSEMIDE 20 MG PO TABS: 20.0000 mg | ORAL_TABLET | Freq: Every day | ORAL | Status: DC

## 2020-08-26 MED ORDER — ACETAMINOPHEN 650 MG RE SUPP: 650.0000 mg | Freq: Four times a day (QID) | RECTAL | Status: DC | PRN

## 2020-08-26 MED ORDER — ACETAMINOPHEN 325 MG PO TABS: 650.0000 mg | ORAL_TABLET | Freq: Four times a day (QID) | ORAL | Status: DC | PRN

## 2020-08-26 MED ORDER — LEVALBUTEROL HCL 0.63 MG/3ML IN NEBU: 0.6300 mg | INHALATION_SOLUTION | RESPIRATORY_TRACT | Status: DC | PRN

## 2020-08-26 MED ORDER — PROPAFENONE HCL 225 MG PO TABS
225.0000 mg | ORAL_TABLET | Freq: Two times a day (BID) | ORAL | Status: DC
  Filled 2020-08-26: qty 1

## 2020-08-26 MED ORDER — MAGNESIUM SULFATE 2 GM/50ML IV SOLN
2.0000 g | INTRAVENOUS | Status: AC
  Administered 2020-08-26: 2 g via INTRAVENOUS
  Filled 2020-08-26: qty 50

## 2020-08-26 MED ORDER — NITROGLYCERIN 0.4 MG SL SUBL: 0.4000 mg | SUBLINGUAL_TABLET | SUBLINGUAL | Status: DC | PRN

## 2020-08-26 MED ORDER — SODIUM CHLORIDE 0.9% FLUSH
3.0000 mL | Freq: Two times a day (BID) | INTRAVENOUS | Status: DC
  Administered 2020-08-27 – 2020-08-28 (×4): 3 mL via INTRAVENOUS

## 2020-08-26 MED ORDER — PANTOPRAZOLE SODIUM 40 MG PO TBEC
40.0000 mg | DELAYED_RELEASE_TABLET | Freq: Every day | ORAL | Status: DC
  Administered 2020-08-27 – 2020-08-29 (×3): 40 mg via ORAL
  Filled 2020-08-26 (×3): qty 1

## 2020-08-26 MED ORDER — LATANOPROST 0.005 % OP SOLN
1.0000 [drp] | Freq: Every day | OPHTHALMIC | Status: DC
  Administered 2020-08-27 – 2020-08-28 (×3): 1 [drp] via OPHTHALMIC
  Filled 2020-08-26: qty 2.5

## 2020-08-26 MED ORDER — POTASSIUM CHLORIDE 10 MEQ/100ML IV SOLN: 10.0000 meq | INTRAVENOUS | Status: DC

## 2020-08-26 MED ORDER — METOPROLOL TARTRATE 25 MG PO TABS
25.0000 mg | ORAL_TABLET | Freq: Two times a day (BID) | ORAL | Status: DC
  Administered 2020-08-27 – 2020-08-29 (×4): 25 mg via ORAL
  Filled 2020-08-26 (×5): qty 1

## 2020-08-26 MED ORDER — POTASSIUM CHLORIDE 10 MEQ/100ML IV SOLN
10.0000 meq | INTRAVENOUS | Status: AC
  Administered 2020-08-27 (×4): 10 meq via INTRAVENOUS
  Filled 2020-08-26 (×5): qty 100

## 2020-08-26 NOTE — ED Notes (Signed)
Phone call to Ryan [name] to come back to ED for abnormal labs.

## 2020-08-26 NOTE — H&P (Signed)
History and Physical   ADRICK KESTLER UEA:[id number] DOB: 12-09-1945 DOA: 08/26/2020  PCP: [name], MD   Patient coming from: Home  Chief Complaint: Chest pain  HPI: Ryan [name] is a 74 y.o. male with medical history significant of adjustment disorder, anxiety, A. fib on Eliquis, COPD, hypertension, GERD, hyperlipidemia, peripheral arterial disease, chronic nausea vomiting diarrhea, OSA presents with chest pain.  Patient initially presented for an episode of chest pain he had earlier today.  The pain is described as a moderate pain located on his left chest and left axilla.  He had some dizziness and lightheadedness at the time.  Does symptoms are what prompted him to present to the ED.  He denies any associated shortness of breath, diaphoresis, further radiation of the pain.  He attributes the symptoms to reaction to a new medication to help control his chronic diarrhea, Lomotil.   Patient left due to the wait in the ED as he was told he was have to wait for many hours after his initial chest x-ray and labs were drawn.  He was called back when his initial labs returned abnormal with potassium of 2.9 and magnesium of 0.4.  Of note patient was admitted for hypomagnesemia hypokalemia in July following a GI illness.  Patient has had 3 to 4 weeks of diarrhea and he is following with GI for at this time.  ED Course: Vital signs in ED significant for BP elevated to 478 systolic.  Lab work showed potassium of 2.9 magnesium 0.4 as above, creatinine stable at 1.3.  Hemoglobin stable at 12.1.  Troponins flat at 15 and 13.  Review of Systems: As per HPI otherwise all other systems reviewed and are negative.  Past Medical History:  Diagnosis Date  . A-fib ([place]) 03/22/2018  . Anemia   . Atrial flutter ([place]) 03/31/2012   converted spontaneously to sinus  . CAD (coronary artery disease)    reportedly moderate CAD; managed medically  . CHF (congestive heart failure) ([place])   . COPD (chronic  obstructive pulmonary disease) ([place])   . Eczema 08/03/2014  . Edema 03/22/2018  . GERD (gastroesophageal reflux disease)   . H/O: rheumatic fever   . Heart murmur   . Heart murmur   . High risk medication use    on amiodarone since 03/31/2012  . History of colonoscopy   . Hyperglycemia 06/21/2017  . Hyperlipidemia   . Hypertension   . Kidney stone 06/21/2017  . Kidney stones   . Lobar pneumonia ([place]) 03/22/2018  . Low vitamin D level 06/21/2017  . Macular degeneration   . Osteoporosis 05/31/2016    Past Surgical History:  Procedure Laterality Date  . APPENDECTOMY    . CARDIAC CATHETERIZATION  04/22/2011   moderate left main and RCA stenosis not significant by FFR and IVUS on medical therapy  . CARDIOVERSION N/A 04/25/2018   Procedure: CARDIOVERSION;  Surgeon: Skeet Latch, MD;  Location: [facility] ENDOSCOPY;  Service: Cardiovascular;  Laterality: N/A;  . CERVICAL DISCECTOMY     C5,6,7 disc fused  with plate and 5 1" screws  . CHOLECYSTECTOMY    . colon polyp removal  3/1.19  . FOOT SURGERY     right calcification removed from top of foot  . knee cartiledge Right    right knee  . MOUTH SURGERY     periodontal surgery, bridges, splint in front,     Social History  reports that he quit smoking about 7 years ago. His smoking use included  cigarettes. He has a 25.00 pack-year smoking history. He has quit using smokeless tobacco. He reports that he does not drink alcohol and does not use drugs.  Allergies  Allergen Reactions  . Albumin (Human) Anaphylaxis  . Polymyxin B-Trimethoprim Swelling    Eye drops made eyes swell  . Pseudoephedrine Other (See Comments)    Stomach cramps  . Codeine Hives, Itching and Rash  . Guaiacol Other (See Comments)    Hallucinations  . Statins Other (See Comments)    Muscle cramps   . Meloxicam Other (See Comments)    Unknown reaction  . Pseudoephedrine-Guaifenesin Nausea And Vomiting    Stomach cramps  . Rosuvastatin Calcium Other (See Comments)     Muscle aches  . Tapentadol Other (See Comments)    Unknown reaction  . Ciprofloxacin Hives, Itching, Nausea Only and Rash  . Moxifloxacin Nausea Only and Other (See Comments)    Headaches, stomach cramps   . Rofecoxib Other (See Comments)    Stomach cramping    Family History  Problem Relation Age of Onset  . Heart disease Mother   . Diabetes Mother   . Cirrhosis Mother   . Emphysema Mother        never smoked but 2nd hand through her spouse  . Hypertension Mother   . Macular degeneration Mother   . Heart disease Father   . Cancer Father        prostate  . Hyperlipidemia Father   . Hypertension Father   . Varicose Veins Father   . Heart attack Father   . Peripheral vascular disease Father   . Heart disease Sister   . Arthritis Sister   . Hyperlipidemia Sister   . Obesity Sister   . Macular degeneration Sister   . Heart disease Brother        5 stents  . Hyperlipidemia Brother   . Macular degeneration Maternal Grandfather   . Cirrhosis Sister   . Obesity Sister   . Arthritis Sister   . Heart disease Sister   . Obesity Sister   . Liver disease Other   . Prostate cancer Other   . Coronary artery disease Other   Reviewed on admission  Prior to Admission medications   Medication Sig Start Date End Date Taking? Authorizing Provider  brimonidine (ALPHAGAN) 0.2 % ophthalmic solution Place 1 drop into the left eye in the morning and at bedtime.  09/10/17   [provider]  Cholecalciferol (VITAMIN D3) 5000 units CAPS Take 5,000 Units by mouth every other day.     [provider]  clobetasol (TEMOVATE) 0.05 % external solution APPLY 1 APPLICATION TOPICALLY TWICE DAILY 11/20/16   [name], MD  ELIQUIS 5 MG TABS tablet TAKE 1 TABLET BY MOUTH  TWICE DAILY 06/24/20   Evans Lance, MD  fluconazole (DIFLUCAN) 100 MG tablet Take 100 mg by mouth as needed (oral flush).    [provider]  furosemide (LASIX) 20 MG tablet TAKE 1 TABLET BY MOUTH   DAILY 08/14/19   [name], MD  latanoprost (XALATAN) 0.005 % ophthalmic solution Place 1 drop into the left eye at bedtime.     [provider]  levalbuterol Penne Lash HFA) 45 MCG/ACT inhaler Inhale 2 puffs into the lungs every 4 (four) hours as needed for wheezing. 06/05/20 06/05/21  Saguier, Percell Miller, PA-C  metoprolol tartrate (LOPRESSOR) 25 MG tablet TAKE 1 TABLET BY MOUTH  TWICE DAILY 06/24/20   Evans Lance, MD  Multiple  Vitamins-Minerals (ICAPS MV) TABS Take 1 tablet by mouth 2 (two) times daily.     [provider]  nitroGLYCERIN (NITROSTAT) 0.4 MG SL tablet Place 1 tablet (0.4 mg total) under the tongue every 5 (five) minutes as needed for chest pain. 04/14/20   Allie Bossier, MD  omeprazole (PRILOSEC OTC) 20 MG tablet Take 20 mg by mouth daily.    [provider]  potassium chloride (KLOR-CON) 10 MEQ tablet TAKE 1 TABLET BY MOUTH  DAILY 08/14/19   Evans Lance, MD  propafenone (RYTHMOL) 225 MG tablet TAKE 1 TABLET BY MOUTH  TWICE DAILY 06/24/20   Evans Lance, MD  Ranibizumab (LUCENTIS IO) Inject 1 Dose into the eye as needed (Macular degeneration).     [provider]    Physical Exam: Vitals:   08/26/20 1752 08/26/20 2005 08/26/20 2229 08/26/20 2330  BP: (!) 153/88 (!) 145/64 (!) 164/84 (!) 165/77  Pulse: 60 (!) 58 68 64  Resp: (!) 38 16 (!) 30 19  Temp: 99 F (37.2 C)  98.1 F (36.7 C)   TempSrc:   Oral   SpO2: 97% 95% 100% 98%   Physical Exam Constitutional:      General: He is not in acute distress.    Appearance: Normal appearance.  HENT:     Head: Normocephalic and atraumatic.     Mouth/Throat:     Mouth: Mucous membranes are moist.     Pharynx: Oropharynx is clear.  Eyes:     Extraocular Movements: Extraocular movements intact.     Pupils: Pupils are equal, round, and reactive to light.  Cardiovascular:     Rate and Rhythm: Normal rate and regular rhythm.     Pulses: Normal pulses.     Heart sounds: Murmur heard.    Pulmonary:     Effort: Pulmonary effort is normal. No respiratory distress.     Breath sounds: Normal breath sounds.  Abdominal:     General: Bowel sounds are normal. There is no distension.     Palpations: Abdomen is soft.     Tenderness: There is no abdominal tenderness.  Musculoskeletal:        General: No swelling or deformity.  Skin:    General: Skin is warm and dry.  Neurological:     General: No focal deficit present.     Mental Status: Mental status is at baseline.     Labs on Admission: I have personally reviewed following labs and imaging studies  CBC: Recent Labs  Lab 08/26/20 1800  WBC 10.0  HGB 12.1*  HCT 36.7*  MCV 94.1  PLT 401    Basic Metabolic Panel: Recent Labs  Lab 08/26/20 1800  NA 139  K 2.9*  CL 102  CO2 25  GLUCOSE 112*  BUN 12  CREATININE 1.30*  CALCIUM 6.7*  MG 0.4*    GFR: CrCl cannot be calculated (Unknown ideal weight.).  Liver Function Tests: No results for input(s): AST, ALT, ALKPHOS, BILITOT, PROT, ALBUMIN in the last 168 hours.  Urine analysis:    Component Value Date/Time   COLORURINE YELLOW 04/09/2020 1025   APPEARANCEUR CLEAR 04/09/2020 1025   LABSPEC 1.004 (L) 04/09/2020 1025   PHURINE 7.0 04/09/2020 1025   GLUCOSEU NEGATIVE 04/09/2020 1025   GLUCOSEU NEGATIVE 12/27/2017 1157   HGBUR LARGE (A) 04/09/2020 1025   HGBUR negative 03/15/2009 0804   BILIRUBINUR NEGATIVE 04/09/2020 1025   BILIRUBINUR n 12/01/2013 1136   KETONESUR 5 (A) 04/09/2020 1025  PROTEINUR NEGATIVE 04/09/2020 1025   UROBILINOGEN 0.2 12/27/2017 1157   NITRITE NEGATIVE 04/09/2020 1025   LEUKOCYTESUR SMALL (A) 04/09/2020 1025    Radiological Exams on Admission: DG Chest 2 View  Result Date: 08/26/2020 CLINICAL DATA:  Chest pain, tingling all over body X 1 day. Diarrhea X 4 weeks. EXAM: CHEST - 2 VIEW COMPARISON:  Chest x-ray 06/04/2020, CT chest 03/26/2011 FINDINGS: The heart size and mediastinal contours are within normal limits.  Flattening of bilateral hemidiaphragms consistent with emphysematous changes. Right basilar scarring/atelectasis. Biapical, right greater than left, pleural/pulmonary scarring. No focal consolidation. No pulmonary edema. No pleural effusion. No pneumothorax. No acute osseous abnormality. Partially visualized cervical spine surgical hardware. IMPRESSION: 1. No acute cardiopulmonary abnormality. 2. If clinically appropriate, please evaluate patient on age and smoking history to determine if patient meets the inclusion criteria for specialized low-dose lung cancer screening chest CT. Electronically Signed   By: Iven Finn M.D.   On: 08/26/2020 19:08   EKG: Independently reviewed.  Sinus bradycardia 59 bpm.  Assessment/Plan Principal Problem:   Hypomagnesemia Active Problems:   Essential hypertension   COPD GOLD I   GERD   Anticoagulation adequate with anticoagulant therapy   BPH (benign prostatic hyperplasia)   CHF (congestive heart failure) (HCC)   Edema   Nausea, vomiting, and diarrhea   Hypokalemia   Emphysema lung (HCC)  Hypokalemia Hypomagnesemia > Potassium 2.9 and magnesium of 0.4 in ED > Received 2 g magnesium and started on 6 rounds of IV potassium > In the setting of chronic diarrhea being evaluated by GI > Previous admission for hypomagnesemia and hypokalemia due to GI illness in July > New EKG changes noted, EKG appears similar to previous. - Repeat magnesium level tonight to evaluate for need for further ablation - Recheck potassium level in the morning - AM EKG  Chronic diarrhea > Following with GI outpatient for this > C. difficile negative > Believe Lomotil contributed to his symptoms today - Hold Lomotil for now  A. fib on Eliquis -Continue home Rythmol, metoprolol, Eliquis  COPD -Continue home albuterol as needed  CHF > Echo July 2021 with EF 60-65%, G1 DD) - Continue Lasix, metoprolol  Hypertension -Continue home metoprolol, Lasix  GERD -Continue  home Protonix  BPH - Continue tamsulosin   DVT prophylaxis: Eliquis Code Status:   Full  Family Communication:  Discussed with family, spouse, at bedside Disposition Plan:   Patient is from:  Home  Anticipated DC to:  Home  Anticipated DC date:  Pending clinical course  Anticipated DC barriers: None  Consults called:  None Admission status:  Observation, telemetry   Severity of Illness: The appropriate patient status for this patient is OBSERVATION. Observation status is judged to be reasonable and necessary in order to provide the required intensity of service to ensure the patient's safety. The patient's presenting symptoms, physical exam findings, and initial radiographic and laboratory data in the context of their medical condition is felt to place them at decreased risk for further clinical deterioration. Furthermore, it is anticipated that the patient will be medically stable for discharge from the hospital within 2 midnights of admission. The following factors support the patient status of observation.   " The patient's presenting symptoms include chest pain, diarrhea. " The physical exam findings include murmur. " The initial radiographic and laboratory data are magnesium 0.4, potassium 2.9.   [name] MD [facility]  How to contact the [facility] Attending or Consulting provider [place] or  covering provider during after hours [place], for this patient?   1. Check the care team in [facility] and look for a) attending/consulting [facility] provider listed and b) the [facility] team listed 2. Log into [email or web address] and use [place]'s universal password to access. If you do not have the password, please contact the hospital operator. 3. Locate the [facility] provider you are looking for under [facility] and page to a number that you can be directly reached. 4. If you still have difficulty reaching the provider, please page the [facility] (Director on Call) for the Hospitalists listed on amion  for assistance.  08/27/2020, 12:32 AM

## 2020-08-26 NOTE — ED Notes (Signed)
Pt stated he was leaving due to the long wait. IV was removed

## 2020-08-26 NOTE — ED Provider Notes (Signed)
[facility] EMERGENCY DEPARTMENT Provider Note   CSN: [id number] Arrival date & time: 08/26/20  1743     History Chief Complaint  Patient presents with  . Chest Pain    Ryan [name] is a 74 y.o. male.  Patient presents to the emergency department with a chief complaint of left-sided chest pain.  He states his symptoms started prior to arrival.  They have now resolved.  He denies any associated shortness of breath, diaphoresis, or radiating pain.  He reports that he had been taking lomotil recent for diarrhea.  He thinks this is what caused his symptoms.  He reports that he did have some dizziness earlier.  His symptoms now resolved.  He was admitted several months ago for low potassium and low magnesium.  Denies any other treatments prior to arrival.  Is currently under GI evaluation for diarrhea.  The history is provided by the patient. No language interpreter was used.       Past Medical History:  Diagnosis Date  . A-fib ([place]) 03/22/2018  . Anemia   . Atrial flutter ([place]) 03/31/2012   converted spontaneously to sinus  . CAD (coronary artery disease)    reportedly moderate CAD; managed medically  . CHF (congestive heart failure) ([place])   . COPD (chronic obstructive pulmonary disease) ([place])   . Eczema 08/03/2014  . Edema 03/22/2018  . GERD (gastroesophageal reflux disease)   . H/O: rheumatic fever   . Heart murmur   . Heart murmur   . High risk medication use    on amiodarone since 03/31/2012  . History of colonoscopy   . Hyperglycemia 06/21/2017  . Hyperlipidemia   . Hypertension   . Kidney stone 06/21/2017  . Kidney stones   . Lobar pneumonia ([place]) 03/22/2018  . Low vitamin D level 06/21/2017  . Macular degeneration   . Osteoporosis 05/31/2016    Patient Active Problem List   Diagnosis Date Noted  . Dyspnea 04/20/2020  . Anxiety 04/20/2020  . Gastritis 04/10/2020  . 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 Emphysema lung ([place]) 04/10/2020  .  Elevated troponin 04/09/2020  . Nausea, vomiting, and diarrhea 04/09/2020  . Hypomagnesemia 04/09/2020  . Hypokalemia 04/09/2020  . Acute urinary retention 04/09/2020  . Statin intolerance 12/19/2019  . Vitamin D deficiency 09/18/2019  . CHF (congestive heart failure) ([place]) 03/22/2018  . A-fib ([place]) 03/22/2018  . Edema 03/22/2018  . H/O: rheumatic fever 12/27/2017  . Low vitamin D level 06/21/2017  . Hyperglycemia 06/21/2017  . Kidney stone 06/21/2017  . 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 Osteoporosis 05/31/2016  . BPH (benign prostatic hyperplasia) 01/20/2015  . Erectile dysfunction 01/20/2015  . Rectal bleeding 01/20/2015  . Preventative health care 01/20/2015  . Eczema 08/03/2014  . Sleep apnea 03/01/2014  . Anticoagulation adequate with anticoagulant therapy 03/01/2014  . Pre-syncope 02/26/2014  . Abdominal pain 02/26/2014  . PAD (peripheral artery disease) ([place]) 02/26/2014  . Constipation 03/06/2013  . Easy bruising 03/06/2013  . History of alcohol abuse 04/15/2012  . CAD (coronary atherosclerotic disease) 05/08/2011  . Atypical chest pain 03/10/2011  . 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 UNS ADVRS EFF UNS RX MEDICINAL&BIOLOGICAL SBSTNC 11/11/2009  . H/O tobacco use, presenting hazards to health 07/29/2009  . COPD GOLD I 07/29/2009  . Hyperlipidemia, mixed 06/20/2007  . Macular degeneration (senile) of retina 06/20/2007  . Essential hypertension 06/20/2007  . GERD 06/16/2007    Past Surgical History:  Procedure Laterality Date  . APPENDECTOMY    . CARDIAC CATHETERIZATION  04/22/2011   moderate left main and RCA stenosis not significant by FFR and IVUS on medical therapy  . CARDIOVERSION N/A 04/25/2018   Procedure: CARDIOVERSION;  Surgeon: Skeet Latch, MD;  Location: [facility] ENDOSCOPY;  Service: Cardiovascular;  Laterality: N/A;  . CERVICAL DISCECTOMY     C5,6,7 disc fused  with plate and 5 1" screws  .  CHOLECYSTECTOMY    . colon polyp removal  3/1.19  . FOOT SURGERY     right calcification removed from top of foot  . knee cartiledge Right    right knee  . MOUTH SURGERY     periodontal surgery, bridges, splint in front,        Family History  Problem Relation Age of Onset  . Heart disease Mother   . Diabetes Mother   . Cirrhosis Mother   . Emphysema Mother        never smoked but 2nd hand through her spouse  . Hypertension Mother   . Macular degeneration Mother   . Heart disease Father   . Cancer Father        prostate  . Hyperlipidemia Father   . Hypertension Father   . Varicose Veins Father   . Heart attack Father   . Peripheral vascular disease Father   . Heart disease Sister   . Arthritis Sister   . Hyperlipidemia Sister   . Obesity Sister   . Macular degeneration Sister   . Heart disease Brother        5 stents  . Hyperlipidemia Brother   . Macular degeneration Maternal Grandfather   . Cirrhosis Sister   . Obesity Sister   . Arthritis Sister   . Heart disease Sister   . Obesity Sister   . Liver disease Other   . Prostate cancer Other   . Coronary artery disease Other     Social History   Tobacco Use  . Smoking status: Former Smoker    Packs/day: 0.50    Years: 50.00    Pack years: 25.00    Types: Cigarettes    Quit date: 05/01/2013    Years since quitting: 7.3  . Smokeless tobacco: Former Systems developer  . Tobacco comment: using e-Cig//ldc  Vaping Use  . Vaping Use: Never used  Substance Use Topics  . Alcohol use: No  . Drug use: No    Home Medications Prior to Admission medications   Medication Sig Start Date End Date Taking? Authorizing Provider  brimonidine (ALPHAGAN) 0.2 % ophthalmic solution Place 1 drop into the left eye in the morning and at bedtime.  09/10/17   [provider]  Cholecalciferol (VITAMIN D3) 5000 units CAPS Take 5,000 Units by mouth every other day.     [provider]  clobetasol (TEMOVATE) 0.05 % external  solution APPLY 1 APPLICATION TOPICALLY TWICE DAILY 11/20/16   [name], MD  ELIQUIS 5 MG TABS tablet TAKE 1 TABLET BY MOUTH  TWICE DAILY 06/24/20   Evans Lance, MD  fluconazole (DIFLUCAN) 100 MG tablet Take 100 mg by mouth as needed (oral flush).    [provider]  furosemide (LASIX) 20 MG tablet TAKE 1 TABLET BY MOUTH  DAILY 08/14/19   [name], MD  latanoprost (XALATAN) 0.005 % ophthalmic solution Place 1 drop into the left eye at bedtime.     [provider]  levalbuterol Penne Lash HFA) 45 MCG/ACT inhaler Inhale 2 puffs  into the lungs every 4 (four) hours as needed for wheezing. 06/05/20 06/05/21  Saguier, Percell Miller, PA-C  metoprolol tartrate (LOPRESSOR) 25 MG tablet TAKE 1 TABLET BY MOUTH  TWICE DAILY 06/24/20   Evans Lance, MD  Multiple Vitamins-Minerals (ICAPS MV) TABS Take 1 tablet by mouth 2 (two) times daily.     [provider]  nitroGLYCERIN (NITROSTAT) 0.4 MG SL tablet Place 1 tablet (0.4 mg total) under the tongue every 5 (five) minutes as needed for chest pain. 04/14/20   Allie Bossier, MD  omeprazole (PRILOSEC OTC) 20 MG tablet Take 20 mg by mouth daily.    [provider]  potassium chloride (KLOR-CON) 10 MEQ tablet TAKE 1 TABLET BY MOUTH  DAILY 08/14/19   Evans Lance, MD  propafenone (RYTHMOL) 225 MG tablet TAKE 1 TABLET BY MOUTH  TWICE DAILY 06/24/20   Evans Lance, MD  Ranibizumab (LUCENTIS IO) Inject 1 Dose into the eye as needed (Macular degeneration).     [provider]    Allergies    Albumin (human), Polymyxin b-trimethoprim, Pseudoephedrine, Codeine, Guaiacol, Statins, Meloxicam, Pseudoephedrine-guaifenesin, Rosuvastatin calcium, Tapentadol, Ciprofloxacin, Moxifloxacin, and Rofecoxib  Review of Systems   Review of Systems  All other systems reviewed and are negative.   Physical Exam Updated Vital Signs BP (!) 164/84 (BP Location: Right Arm)   Pulse 68   Temp 98.1 F (36.7 C) (Oral)   Resp (!) 30    SpO2 100%   Physical Exam Vitals and nursing note reviewed.  Constitutional:      Appearance: He is well-developed.  HENT:     Head: Normocephalic and atraumatic.  Eyes:     Conjunctiva/sclera: Conjunctivae normal.  Cardiovascular:     Rate and Rhythm: Normal rate and regular rhythm.     Heart sounds: No murmur heard.   Pulmonary:     Effort: Pulmonary effort is normal. No respiratory distress.     Breath sounds: Normal breath sounds.  Abdominal:     Palpations: Abdomen is soft.     Tenderness: There is no abdominal tenderness.  Musculoskeletal:        General: Normal range of motion.     Cervical back: Neck supple.  Skin:    General: Skin is warm and dry.  Neurological:     Mental Status: He is alert and oriented to person, place, and time.  Psychiatric:        Mood and Affect: Mood normal.        Behavior: Behavior normal.     ED Results / Procedures / Treatments   Labs (all labs ordered are listed, but only abnormal results are displayed) Labs Reviewed  MAGNESIUM - Abnormal; Notable for the following components:      Result Value   Magnesium 0.4 (*)    All other components within normal limits  BASIC METABOLIC PANEL - Abnormal; Notable for the following components:   Potassium 2.9 (*)    Glucose, Bld 112 (*)    Creatinine, Ser 1.30 (*)    Calcium 6.7 (*)    GFR, Estimated 58 (*)    All other components within normal limits  CBC - Abnormal; Notable for the following components:   RBC 3.90 (*)    Hemoglobin 12.1 (*)    HCT 36.7 (*)    All other components within normal limits  PROTIME-INR - Abnormal; Notable for the following components:   Prothrombin Time 17.5 (*)    INR 1.5 (*)  All other components within normal limits  TROPONIN I (HIGH SENSITIVITY)  TROPONIN I (HIGH SENSITIVITY)    EKG EKG Interpretation  Date/Time:  Monday August 26 2020 17:47:59 EST Ventricular Rate:  59 PR Interval:  190 QRS Duration: 90 QT Interval:  444 QTC  Calculation: 439 R Axis:   -15 Text Interpretation: Sinus bradycardia Cannot rule out Anterior infarct , age undetermined Abnormal ECG Confirmed by Quintella Reichert [phone]) on 08/26/2020 10:25:32 PM   Radiology DG Chest 2 View  Result Date: 08/26/2020 CLINICAL DATA:  Chest pain, tingling all over body X 1 day. Diarrhea X 4 weeks. EXAM: CHEST - 2 VIEW COMPARISON:  Chest x-ray 06/04/2020, CT chest 03/26/2011 FINDINGS: The heart size and mediastinal contours are within normal limits. Flattening of bilateral hemidiaphragms consistent with emphysematous changes. Right basilar scarring/atelectasis. Biapical, right greater than left, pleural/pulmonary scarring. No focal consolidation. No pulmonary edema. No pleural effusion. No pneumothorax. No acute osseous abnormality. Partially visualized cervical spine surgical hardware. IMPRESSION: 1. No acute cardiopulmonary abnormality. 2. If clinically appropriate, please evaluate patient on age and smoking history to determine if patient meets the inclusion criteria for specialized low-dose lung cancer screening chest CT. Electronically Signed   By: Iven Finn M.D.   On: 08/26/2020 19:08    Procedures .Critical Care Performed by: Montine Circle, PA-C Authorized by: Montine Circle, PA-C   Critical care provider statement:    Critical care time (minutes):  40   Critical care was necessary to treat or prevent imminent or life-threatening deterioration of the following conditions:  Metabolic crisis   Critical care was time spent personally by me on the following activities:  Discussions with consultants, evaluation of patient's response to treatment, examination of patient, ordering and performing treatments and interventions, ordering and review of laboratory studies, ordering and review of radiographic studies, pulse oximetry, re-evaluation of patient's condition, obtaining history from patient or surrogate and review of old charts   (including critical  care time)  Medications Ordered in ED Medications  magnesium sulfate IVPB 2 g 50 mL (has no administration in time range)  potassium chloride 10 mEq in 100 mL IVPB (has no administration in time range)    ED Course  I have reviewed the triage vital signs and the nursing notes.  Pertinent labs & imaging results that were available during my care of the patient were reviewed by me and considered in my medical decision making (see chart for details).    MDM Rules/Calculators/A&P                          This patient complains of chest pain, this involves an extensive number of treatment options, and is a complaint that carries with it a high risk of complications and morbidity.    Differential Dx ACS, PE, CHF, pneumonia, covid  Pertinent Labs I reviewed, and interpreted labs, which included magnesium 0.4, potassium 2.9, no significant leukocytosis, troponins are 15, then 13.  Mild anemia at hemoglobin of 12.1.  Imaging Interpretation I ordered imaging studies which included chest x-ray, which showed no acute abnormality, but there is left pleural and pulmonary scarring along with flattening of bilateral hemidiaphragms consistent with emphysematous changes.  Follow-up low-dose lung cancer chest CT recommended.   Medications I ordered medication magnesium infusion and potassium infusion for low mag and hypokalemia  Sources Previous records obtained and reviewed prior admission for gastritis and low electrolytes in the summer 2021.   Critical Interventions  Electrolyte replacement  for critically low magnesium, and for low potassium.  Reassessments After the interventions stated above, I reevaluated the patient and found still without any recurrent chest pain, but will need admission for electrolyte replacement given that he had chest pain and EKG changes.  Consultants [facility], Dr. Trilby Drummer, who is appreciated for admitting the patient.  Plan Admit to medicine.    Final Clinical  Impression(s) / ED Diagnoses Final diagnoses:  Other chest pain  Hypomagnesemia  Hypokalemia  Diarrhea, unspecified type    Rx / DC Orders ED Discharge Orders    None       Montine Circle, PA-C 08/26/20 2335    Quintella Reichert, MD 08/27/20 1710

## 2020-08-26 NOTE — ED Triage Notes (Signed)
Arrived via EMS; c/o sudden onset while watching TV. Patient appears anxious and redirected. Reported while he was in the bathroom and passing gas, he felt some numbness on legs. Endorsed hx of rheumatic fever. Patient stated "I know this day will come" Informed patient and family that CXR, blood and EKG are ordered to evaluate patient.

## 2020-08-27 DIAGNOSIS — I482 Chronic atrial fibrillation, unspecified: Secondary | ICD-10-CM

## 2020-08-27 DIAGNOSIS — E559 Vitamin D deficiency, unspecified: Secondary | ICD-10-CM | POA: Diagnosis present

## 2020-08-27 DIAGNOSIS — I5032 Chronic diastolic (congestive) heart failure: Secondary | ICD-10-CM

## 2020-08-27 DIAGNOSIS — J449 Chronic obstructive pulmonary disease, unspecified: Secondary | ICD-10-CM | POA: Diagnosis present

## 2020-08-27 DIAGNOSIS — R0789 Other chest pain: Secondary | ICD-10-CM | POA: Diagnosis present

## 2020-08-27 DIAGNOSIS — I10 Essential (primary) hypertension: Secondary | ICD-10-CM

## 2020-08-27 DIAGNOSIS — R001 Bradycardia, unspecified: Secondary | ICD-10-CM | POA: Diagnosis present

## 2020-08-27 DIAGNOSIS — R531 Weakness: Secondary | ICD-10-CM | POA: Diagnosis present

## 2020-08-27 DIAGNOSIS — H3530 Unspecified macular degeneration: Secondary | ICD-10-CM | POA: Diagnosis present

## 2020-08-27 DIAGNOSIS — Z20822 Contact with and (suspected) exposure to covid-19: Secondary | ICD-10-CM | POA: Diagnosis present

## 2020-08-27 DIAGNOSIS — R011 Cardiac murmur, unspecified: Secondary | ICD-10-CM | POA: Diagnosis present

## 2020-08-27 DIAGNOSIS — J439 Emphysema, unspecified: Secondary | ICD-10-CM | POA: Diagnosis present

## 2020-08-27 DIAGNOSIS — K529 Noninfective gastroenteritis and colitis, unspecified: Secondary | ICD-10-CM | POA: Diagnosis present

## 2020-08-27 DIAGNOSIS — I110 Hypertensive heart disease with heart failure: Secondary | ICD-10-CM | POA: Diagnosis present

## 2020-08-27 DIAGNOSIS — M810 Age-related osteoporosis without current pathological fracture: Secondary | ICD-10-CM | POA: Diagnosis present

## 2020-08-27 DIAGNOSIS — N400 Enlarged prostate without lower urinary tract symptoms: Secondary | ICD-10-CM | POA: Diagnosis present

## 2020-08-27 DIAGNOSIS — F4322 Adjustment disorder with anxiety: Secondary | ICD-10-CM | POA: Diagnosis present

## 2020-08-27 DIAGNOSIS — E876 Hypokalemia: Secondary | ICD-10-CM | POA: Diagnosis not present

## 2020-08-27 DIAGNOSIS — I2510 Atherosclerotic heart disease of native coronary artery without angina pectoris: Secondary | ICD-10-CM | POA: Diagnosis present

## 2020-08-27 DIAGNOSIS — G4733 Obstructive sleep apnea (adult) (pediatric): Secondary | ICD-10-CM | POA: Diagnosis present

## 2020-08-27 DIAGNOSIS — E782 Mixed hyperlipidemia: Secondary | ICD-10-CM | POA: Diagnosis present

## 2020-08-27 DIAGNOSIS — I739 Peripheral vascular disease, unspecified: Secondary | ICD-10-CM | POA: Diagnosis present

## 2020-08-27 DIAGNOSIS — I420 Dilated cardiomyopathy: Secondary | ICD-10-CM | POA: Diagnosis present

## 2020-08-27 DIAGNOSIS — K219 Gastro-esophageal reflux disease without esophagitis: Secondary | ICD-10-CM | POA: Diagnosis present

## 2020-08-27 LAB — MAGNESIUM
Magnesium: 0.9 mg/dL — CL (ref 1.7–2.4)
Magnesium: 0.9 mg/dL — CL (ref 1.7–2.4)
Magnesium: 1.3 mg/dL — ABNORMAL LOW (ref 1.7–2.4)

## 2020-08-27 LAB — CBC
HCT: 33.1 % — ABNORMAL LOW (ref 39.0–52.0)
Hemoglobin: 11.4 g/dL — ABNORMAL LOW (ref 13.0–17.0)
MCH: 31.3 pg (ref 26.0–34.0)
MCHC: 34.4 g/dL (ref 30.0–36.0)
MCV: 90.9 fL (ref 80.0–100.0)
Platelets: 192 10*3/uL (ref 150–400)
RBC: 3.64 MIL/uL — ABNORMAL LOW (ref 4.22–5.81)
RDW: 13.5 % (ref 11.5–15.5)
WBC: 9.1 10*3/uL (ref 4.0–10.5)
nRBC: 0 % (ref 0.0–0.2)

## 2020-08-27 LAB — COMPREHENSIVE METABOLIC PANEL
ALT: 12 U/L (ref 0–44)
AST: 16 U/L (ref 15–41)
Albumin: 3 g/dL — ABNORMAL LOW (ref 3.5–5.0)
Alkaline Phosphatase: 45 U/L (ref 38–126)
Anion gap: 13 (ref 5–15)
BUN: 11 mg/dL (ref 8–23)
CO2: 25 mmol/L (ref 22–32)
Calcium: 7.1 mg/dL — ABNORMAL LOW (ref 8.9–10.3)
Chloride: 101 mmol/L (ref 98–111)
Creatinine, Ser: 1.14 mg/dL (ref 0.61–1.24)
GFR, Estimated: >60 mL/min (ref >60)
Glucose, Bld: 91 mg/dL (ref 70–99)
Potassium: 2.8 mmol/L — ABNORMAL LOW (ref 3.5–5.1)
Sodium: 139 mmol/L (ref 135–145)
Total Bilirubin: 1.3 mg/dL — ABNORMAL HIGH (ref 0.3–1.2)
Total Protein: 5.9 g/dL — ABNORMAL LOW (ref 6.5–8.1)

## 2020-08-27 LAB — BASIC METABOLIC PANEL
Anion gap: 13 (ref 5–15)
BUN: 10 mg/dL (ref 8–23)
CO2: 23 mmol/L (ref 22–32)
Calcium: 7.4 mg/dL — ABNORMAL LOW (ref 8.9–10.3)
Chloride: 103 mmol/L (ref 98–111)
Creatinine, Ser: 0.96 mg/dL (ref 0.61–1.24)
GFR, Estimated: >60 mL/min (ref >60)
Glucose, Bld: 120 mg/dL — ABNORMAL HIGH (ref 70–99)
Potassium: 3.9 mmol/L (ref 3.5–5.1)
Sodium: 139 mmol/L (ref 135–145)

## 2020-08-27 LAB — RESPIRATORY PANEL BY RT PCR (FLU A&B, COVID)
Influenza A by PCR: NEGATIVE
Influenza B by PCR: NEGATIVE
SARS Coronavirus 2 by RT PCR: NEGATIVE

## 2020-08-27 MED ORDER — MAGNESIUM SULFATE 2 GM/50ML IV SOLN
2.0000 g | Freq: Once | INTRAVENOUS | Status: AC
  Administered 2020-08-27: 2 g via INTRAVENOUS
  Filled 2020-08-27: qty 50

## 2020-08-27 MED ORDER — APIXABAN 5 MG PO TABS
5.0000 mg | ORAL_TABLET | Freq: Two times a day (BID) | ORAL | Status: DC
  Administered 2020-08-27 – 2020-08-29 (×6): 5 mg via ORAL
  Filled 2020-08-27 (×6): qty 1

## 2020-08-27 MED ORDER — POTASSIUM CHLORIDE CRYS ER 20 MEQ PO TBCR
40.0000 meq | EXTENDED_RELEASE_TABLET | Freq: Once | ORAL | Status: AC
  Administered 2020-08-27: 40 meq via ORAL
  Filled 2020-08-27: qty 2

## 2020-08-27 MED ORDER — TAMSULOSIN HCL 0.4 MG PO CAPS
0.4000 mg | ORAL_CAPSULE | Freq: Every evening | ORAL | Status: DC
  Administered 2020-08-28: 0.4 mg via ORAL
  Filled 2020-08-27: qty 1

## 2020-08-27 MED ORDER — MAGNESIUM OXIDE 400 (241.3 MG) MG PO TABS
400.0000 mg | ORAL_TABLET | Freq: Two times a day (BID) | ORAL | Status: DC
  Administered 2020-08-27 – 2020-08-29 (×4): 400 mg via ORAL
  Filled 2020-08-27 (×4): qty 1

## 2020-08-27 MED ORDER — SODIUM CHLORIDE 0.9 % IV SOLN
INTRAVENOUS | Status: DC | PRN
  Administered 2020-08-27: 250 mL via INTRAVENOUS

## 2020-08-27 MED ORDER — MAGNESIUM SULFATE 4 GM/100ML IV SOLN
4.0000 g | Freq: Once | INTRAVENOUS | Status: AC
  Administered 2020-08-27: 4 g via INTRAVENOUS
  Filled 2020-08-27: qty 100

## 2020-08-27 MED ORDER — POTASSIUM CHLORIDE 10 MEQ/100ML IV SOLN
INTRAVENOUS | Status: AC
  Administered 2020-08-27: 10 meq
  Filled 2020-08-27: qty 100

## 2020-08-27 MED ORDER — LOPERAMIDE HCL 2 MG PO CAPS
2.0000 mg | ORAL_CAPSULE | Freq: Four times a day (QID) | ORAL | Status: DC | PRN
  Administered 2020-08-27 – 2020-08-28 (×2): 2 mg via ORAL
  Filled 2020-08-27 (×2): qty 1

## 2020-08-27 MED ORDER — PROPAFENONE HCL 225 MG PO TABS
225.0000 mg | ORAL_TABLET | Freq: Two times a day (BID) | ORAL | Status: DC
  Administered 2020-08-27 – 2020-08-29 (×5): 225 mg via ORAL
  Filled 2020-08-27 (×5): qty 1

## 2020-08-27 MED ORDER — MELATONIN 5 MG PO TABS
5.0000 mg | ORAL_TABLET | Freq: Every day | ORAL | Status: DC
  Administered 2020-08-27 – 2020-08-28 (×2): 5 mg via ORAL
  Filled 2020-08-27 (×2): qty 1

## 2020-08-27 MED ORDER — MELATONIN 3 MG PO TABS
3.0000 mg | ORAL_TABLET | Freq: Every day | ORAL | Status: DC
  Administered 2020-08-27: 3 mg via ORAL
  Filled 2020-08-27 (×2): qty 1

## 2020-08-27 MED ORDER — POTASSIUM CHLORIDE 10 MEQ/100ML IV SOLN
10.0000 meq | INTRAVENOUS | Status: AC
  Administered 2020-08-27 (×4): 10 meq via INTRAVENOUS
  Filled 2020-08-27 (×4): qty 100

## 2020-08-27 NOTE — Progress Notes (Signed)
Patient ID: Ryan [name], male   DOB: 1946/02/13, 74 y.o.   MRN: [id number]   PROGRESS NOTE    Ryan [name]  TGG:[id number] DOB: 1946-02-14 DOA: 08/26/2020 PCP: [name], MD   Brief Narrative:  74 y.o. male with medical history significant of adjustment disorder, anxiety, A. fib on Eliquis, COPD, hypertension, GERD, hyperlipidemia, peripheral arterial disease, chronic nausea, vomiting and diarrhea, OSA presented with chest pain along with dizziness and lightheadedness and attributed the symptoms to reaction to new medication for his chronic diarrhea called Lomotil which was recently prescribed by his GI doctor/Dr. Medoff.  On presentation potassium was 2.9 and magnesium was 0.4.  Troponins were flat at 15 and 13.  He was started on potassium and magnesium replacement.  Assessment & Plan:   Hypokalemia and hypomagnesemia -Patient presented with potassium of 2.9 and magnesium of 0.4 probably from chronic diarrhea -Potassium 2.8 and magnesium 0.9 this morning.  Continue aggressive replacement.  Repeat potassium and magnesium this afternoon and may be again this evening and tomorrow morning.  Chronic diarrhea -Follows up with GI/Dr. Earlean Shawl as an outpatient and was recently prescribed Lomotil.  Patient thinks that Lomotil because his symptoms of chest pain/dizziness and lightheadedness.  Lomotil has since then been held. -No diarrhea since admission.  C. difficile testing was negative on 08/20/2020 as an outpatient in [place] -Outpatient follow-up with GI.  Chest pain -Questionable cause.  Patient attributes his symptoms to Lomotil use.  Troponins flat on admission.  EKG did not show any ischemic changes.  Chest pain has resolved.  No need for further cardiac work-up at this time.  Outpatient follow-up with Dr. Taylor/cardiology  Chronic A. Fib -Continue Rythmol, metoprolol and Eliquis.  Currently slightly bradycardic  COPD -Continue home albuterol as needed.  Currently  respiratory status is stable.  Chronic diastolic CHF -Echo in July 2 121 showed EF of 60 to 65% with grade 1 diastolic dysfunction.  Currently euvolemic.  Strict input output.  Daily weights.  Continue metoprolol.  Holding Lasix because of severe hypokalemia  GERD -Continue Protonix  BPH -Continue tamsulosin  Generalized weakness -PT eval   DVT prophylaxis: Eliquis Code Status: Full Family Communication: Wife at bedside Disposition Plan: Status is: We will switch status to inpatient as patient is still hypokalemic and hypomagnesemic and feels weak.  Remains inpatient appropriate because:Inpatient level of care appropriate due to severity of illness   Dispo: The patient is from: Home              Anticipated d/c is to: Home              Anticipated d/c date is: 1 day              Patient currently is not medically stable to d/c.  Consultants: None  Procedures: None  Antimicrobials: None   Subjective: Patient seen and examined at bedside.  He still feels weak.  Denies current chest pain.  Denies worsening abdominal pain.  Has not had any bowel movement since admission.  No worsening shortness of breath, fever or vomiting reported.  Objective: Vitals:   08/27/20 0900 08/27/20 0930 08/27/20 1000 08/27/20 1030  BP: (!) 149/62 (!) 146/58 (!) 146/55 (!) 153/71  Pulse: 60 (!) 57 (!) 58 (!) 57  Resp: 15 18 (!) 21 20  Temp:      TempSrc:      SpO2: 97% 93% 96% 95%    Intake/Output Summary (Last 24 hours) at 08/27/2020 1108 Last data  filed at 08/27/2020 0704 Gross per 24 hour  Intake 300 ml  Output --  Net 300 ml   There were no vitals filed for this visit.  Examination:  General exam: Appears calm and comfortable  Respiratory system: Bilateral decreased breath sounds at bases with some scattered crackles Cardiovascular system: S1 & S2 heard, Rate controlled Gastrointestinal system: Abdomen is nondistended, soft and nontender. Normal bowel sounds  heard. Extremities: No cyanosis, clubbing; trace lower extremity edema Central nervous system: Alert and oriented. No focal neurological deficits. Moving extremities Skin: No rashes, lesions or ulcers Psychiatry: Judgement and insight appear normal. Mood & affect appropriate.     Data Reviewed: I have personally reviewed following labs and imaging studies  CBC: Recent Labs  Lab 08/26/20 1800 08/27/20 0443  WBC 10.0 9.1  HGB 12.1* 11.4*  HCT 36.7* 33.1*  MCV 94.1 90.9  PLT 211 308   Basic Metabolic Panel: Recent Labs  Lab 08/26/20 1800 08/27/20 0217 08/27/20 0443  NA 139  --  139  K 2.9*  --  2.8*  CL 102  --  101  CO2 25  --  25  GLUCOSE 112*  --  91  BUN 12  --  11  CREATININE 1.30*  --  1.14  CALCIUM 6.7*  --  7.1*  MG 0.4* 0.9* 0.9*   GFR: CrCl cannot be calculated (Unknown ideal weight.). Liver Function Tests: Recent Labs  Lab 08/27/20 0443  AST 16  ALT 12  ALKPHOS 45  BILITOT 1.3*  PROT 5.9*  ALBUMIN 3.0*   No results for input(s): LIPASE, AMYLASE in the last 168 hours. No results for input(s): AMMONIA in the last 168 hours. Coagulation Profile: Recent Labs  Lab 08/26/20 1800  INR 1.5*   Cardiac Enzymes: No results for input(s): CKTOTAL, CKMB, CKMBINDEX, TROPONINI in the last 168 hours. BNP (last 3 results) No results for input(s): PROBNP in the last 8760 hours. HbA1C: No results for input(s): HGBA1C in the last 72 hours. CBG: No results for input(s): GLUCAP in the last 168 hours. Lipid Profile: No results for input(s): CHOL, HDL, LDLCALC, TRIG, CHOLHDL, LDLDIRECT in the last 72 hours. Thyroid Function Tests: No results for input(s): TSH, T4TOTAL, FREET4, T3FREE, THYROIDAB in the last 72 hours. Anemia Panel: No results for input(s): VITAMINB12, FOLATE, FERRITIN, TIBC, IRON, RETICCTPCT in the last 72 hours. Sepsis Labs: No results for input(s): PROCALCITON, LATICACIDVEN in the last 168 hours.  Recent Results (from the past 240 hour(s))   Respiratory Panel by RT PCR (Flu A&B, Covid) - Nasopharyngeal Swab     Status: None   Collection Time: 08/27/20  4:43 AM   Specimen: Nasopharyngeal Swab; Nasopharyngeal(NP) swabs in vial transport medium  Result Value Ref Range Status   SARS Coronavirus 2 by RT PCR NEGATIVE NEGATIVE Final    Comment: (NOTE) SARS-CoV-2 target nucleic acids are NOT DETECTED.  The SARS-CoV-2 RNA is generally detectable in upper respiratoy specimens during the acute phase of infection. The lowest concentration of SARS-CoV-2 viral copies this assay can detect is 131 copies/mL. A negative result does not preclude SARS-Cov-2 infection and should not be used as the sole basis for treatment or other patient management decisions. A negative result may occur with  improper specimen collection/handling, submission of specimen other than nasopharyngeal swab, presence of viral mutation(s) within the areas targeted by this assay, and inadequate number of viral copies (<131 copies/mL). A negative result must be combined with clinical observations, patient history, and epidemiological information. The expected result  is Negative.  Fact Sheet for Patients:  PinkCheek.be  Fact Sheet for Healthcare Providers:  GravelBags.it  This test is no t yet approved or cleared by the [place] FDA and  has been authorized for detection and/or diagnosis of SARS-CoV-2 by FDA under an Emergency Use Authorization (EUA). This EUA will remain  in effect (meaning this test can be used) for the duration of the COVID-19 declaration under Section 564(b)(1) of the Act, 21 U.S.C. section 360bbb-3(b)(1), unless the authorization is terminated or revoked sooner.     Influenza A by PCR NEGATIVE NEGATIVE Final   Influenza B by PCR NEGATIVE NEGATIVE Final    Comment: (NOTE) The Xpert Xpress SARS-CoV-2/FLU/RSV assay is intended as an aid in  the diagnosis of influenza from  Nasopharyngeal swab specimens and  should not be used as a sole basis for treatment. Nasal washings and  aspirates are unacceptable for Xpert Xpress SARS-CoV-2/FLU/RSV  testing.  Fact Sheet for Patients: PinkCheek.be  Fact Sheet for Healthcare Providers: GravelBags.it  This test is not yet approved or cleared by the [place] FDA and  has been authorized for detection and/or diagnosis of SARS-CoV-2 by  FDA under an Emergency Use Authorization (EUA). This EUA will remain  in effect (meaning this test can be used) for the duration of the  Covid-19 declaration under Section 564(b)(1) of the Act, 21  U.S.C. section 360bbb-3(b)(1), unless the authorization is  terminated or revoked. Performed at [facility], [place] [address], [place], [place] [address]          Radiology Studies: DG Chest 2 View  Result Date: 08/26/2020 CLINICAL DATA:  Chest pain, tingling all over body X 1 day. Diarrhea X 4 weeks. EXAM: CHEST - 2 VIEW COMPARISON:  Chest x-ray 06/04/2020, CT chest 03/26/2011 FINDINGS: The heart size and mediastinal contours are within normal limits. Flattening of bilateral hemidiaphragms consistent with emphysematous changes. Right basilar scarring/atelectasis. Biapical, right greater than left, pleural/pulmonary scarring. No focal consolidation. No pulmonary edema. No pleural effusion. No pneumothorax. No acute osseous abnormality. Partially visualized cervical spine surgical hardware. IMPRESSION: 1. No acute cardiopulmonary abnormality. 2. If clinically appropriate, please evaluate patient on age and smoking history to determine if patient meets the inclusion criteria for specialized low-dose lung cancer screening chest CT. Electronically Signed   By: Iven Finn M.D.   On: 08/26/2020 19:08        Scheduled Meds: . apixaban  5 mg Oral BID  . brimonidine  1 drop Left Eye BID  . latanoprost  1 drop Left Eye QHS    . melatonin  3 mg Oral QHS  . metoprolol tartrate  25 mg Oral BID  . pantoprazole  40 mg Oral Daily  . propafenone  225 mg Oral BID  . sodium chloride flush  3 mL Intravenous Q12H  . [START ON 08/28/2020] tamsulosin  0.4 mg Oral QPM   Continuous Infusions: . potassium chloride 10 mEq (08/27/20 1100)          Aline August, MD [facility] 08/27/2020, 11:08 AM

## 2020-08-27 NOTE — Progress Notes (Signed)
Patient has one more potassium to receive via IV route Night Nurse aware . Moved Due to Night time work sheet

## 2020-08-27 NOTE — ED Notes (Signed)
Lunch Tray Ordered @ 1003.

## 2020-08-27 NOTE — Progress Notes (Signed)
Linward Headland wife at the bedside

## 2020-08-27 NOTE — Progress Notes (Signed)
Patient is experiencing 4 loose stools

## 2020-08-27 NOTE — ED Notes (Signed)
Date and time results received: 08/27/20 0545  Test: Magnesium Critical Value: 0.9  Name of Provider Notified: MD Opyd  Orders Received? Or Actions Taken?: Pt already receiving IV magnesium

## 2020-08-27 NOTE — Plan of Care (Signed)
New admit with loose stools and electrolytes imbalance.

## 2020-08-27 NOTE — ED Notes (Signed)
Date and time results received: 08/27/20 0332 (use smartphrase ".now" to insert current time)  Test: Magnesium Critical Value: 0.9  Name of Provider Notified: Marlowe Sax, MD  Orders Received? Or Actions Taken?:

## 2020-08-28 ENCOUNTER — Encounter (HOSPITAL_COMMUNITY): Payer: Self-pay | Admitting: Internal Medicine

## 2020-08-28 ENCOUNTER — Telehealth: Payer: Self-pay | Admitting: Family Medicine

## 2020-08-28 LAB — CBC WITH DIFFERENTIAL/PLATELET
Abs Immature Granulocytes: 0.06 10*3/uL (ref 0.00–0.07)
Basophils Absolute: 0 10*3/uL (ref 0.0–0.1)
Basophils Relative: 0 %
Eosinophils Absolute: 0.2 10*3/uL (ref 0.0–0.5)
Eosinophils Relative: 3 %
HCT: 30.8 % — ABNORMAL LOW (ref 39.0–52.0)
Hemoglobin: 10.6 g/dL — ABNORMAL LOW (ref 13.0–17.0)
Immature Granulocytes: 1 %
Lymphocytes Relative: 22 %
Lymphs Abs: 1.6 10*3/uL (ref 0.7–4.0)
MCH: 31.5 pg (ref 26.0–34.0)
MCHC: 34.4 g/dL (ref 30.0–36.0)
MCV: 91.7 fL (ref 80.0–100.0)
Monocytes Absolute: 0.6 10*3/uL (ref 0.1–1.0)
Monocytes Relative: 8 %
Neutro Abs: 5 10*3/uL (ref 1.7–7.7)
Neutrophils Relative %: 66 %
Platelets: 185 10*3/uL (ref 150–400)
RBC: 3.36 MIL/uL — ABNORMAL LOW (ref 4.22–5.81)
RDW: 13.4 % (ref 11.5–15.5)
WBC: 7.4 10*3/uL (ref 4.0–10.5)
nRBC: 0 % (ref 0.0–0.2)

## 2020-08-28 LAB — COMPREHENSIVE METABOLIC PANEL
ALT: 10 U/L (ref 0–44)
AST: 20 U/L (ref 15–41)
Albumin: 2.8 g/dL — ABNORMAL LOW (ref 3.5–5.0)
Alkaline Phosphatase: 43 U/L (ref 38–126)
Anion gap: 11 (ref 5–15)
BUN: 9 mg/dL (ref 8–23)
CO2: 21 mmol/L — ABNORMAL LOW (ref 22–32)
Calcium: 7.2 mg/dL — ABNORMAL LOW (ref 8.9–10.3)
Chloride: 106 mmol/L (ref 98–111)
Creatinine, Ser: 0.97 mg/dL (ref 0.61–1.24)
GFR, Estimated: >60 mL/min (ref >60)
Glucose, Bld: 88 mg/dL (ref 70–99)
Potassium: 4.4 mmol/L (ref 3.5–5.1)
Sodium: 138 mmol/L (ref 135–145)
Total Bilirubin: 1.5 mg/dL — ABNORMAL HIGH (ref 0.3–1.2)
Total Protein: 5.5 g/dL — ABNORMAL LOW (ref 6.5–8.1)

## 2020-08-28 LAB — MAGNESIUM: Magnesium: 2.4 mg/dL (ref 1.7–2.4)

## 2020-08-28 MED ORDER — CHOLESTYRAMINE 4 G PO PACK
4.0000 g | PACK | Freq: Two times a day (BID) | ORAL | Status: DC
  Administered 2020-08-28 – 2020-08-29 (×3): 4 g via ORAL
  Filled 2020-08-28 (×3): qty 1

## 2020-08-28 NOTE — Telephone Encounter (Signed)
Patient states he is currently in the hospital, Patient states that his Attending MD would like him to have his electrolytes , potassium  and magnesium  test on 09/09/2020 again. Patient is requesting that Dr Charlett Blake place the orders.  Please Advise

## 2020-08-28 NOTE — Progress Notes (Signed)
PROGRESS NOTE    Ryan [name]  YIF:[id number] DOB: [birth year] DOA: 08/26/2020 PCP: [name], MD    Brief Narrative:  Ryan [name] is a 74 year old male with past medical history significant for adjustment disorder, anxiety, paroxysmal atrial fibrillation on Eliquis, COPD, essential hypertension, GERD, hyperlipidemia, peripheral artery disease, chronic nausea, vomiting and diarrhea, OSA who presented to the emergency department with chest pain, dizziness, lightheadedness. Patient attributes symptoms to new medication (Lomotil) prescribed for his chronic diarrhea by his GI doctor (Dr. Earlean Shawl).  Upon presentation to the ED, troponins were flat 15, 13. Potassium 2.9 with a magnesium of 0.4. He was started on electrolyte replacement. Hospitalist service consulted for further evaluation and management.   Assessment & Plan:   Principal Problem:   Hypomagnesemia Active Problems:   Essential hypertension   COPD GOLD I   GERD   Anticoagulation adequate with anticoagulant therapy   BPH (benign prostatic hyperplasia)   CHF (congestive heart failure) (HCC)   Edema   Nausea, vomiting, and diarrhea   Hypokalemia   Emphysema lung (HCC)   Hypokalemia Hypomagnesemia Patient presenting with dizziness, weakness. Was found to have electrolyte disturbance with potassium of 2.9 and magnesium 0.4. Patient suffers from chronic diarrhea; followed closely by gastroenterology, Dr. Earlean Shawl. Per patient, outpatient work-up with multiple colonoscopies with biopsy and lab testing unrevealing. Recently started on Lomotil with questionable reaction. Patient was given aggressive IV electrolyte replacement. --Potassium 4.4 with magnesium 2.4 today. --Repeat electrolytes in the a.m. --Can discontinue telemetry today as electrolytes within normal limits  Chronic diarrhea Patient follows with gastroenterology outpatient, Dr. Arville Go off. Recently prescribed Lomotil; patient believes possible side effects with  dizziness. Extensive outpatient work-up per patient unrevealing. See differential negative on 08/20/2020. --Continue Imodium prn --Starting cholestyramine 4g BID --Continue to monitor strict I's and O's especially bowel movements --We will need close outpatient follow-up with GI  Atypical chest pain Etiology likely noncardiac. Patient attributes his symptoms to Lomotil use. Troponins flat on admission. EKG without any signs of ischemic changes. Chest pain has resolved. No arrhythmias noted on telemetry while inpatient. No further cardiac work-up needed at this time. --DC telemetry today --Outpatient follow-up with cardiology, Dr. Lovena Le as scheduled  Paroxysmal atrial fibrillation --Continue Rythmol, metoprolol, Eliquis  COPD, nonoxygen dependent --Continue home albuterol as needed  Chronic diastolic congestive heart failure, compensated TTE 04/05/2020 with LVEF 60-65% with grade 1 diastolic dysfunction. --Continue metoprolol --Holding Lasix secondary to severe hypokalemia --Strict I's and O's and daily weights  GERD: Continue PPI  BPH: Continue tamsulosin  Generalized weakness Etiology likely secondary to chronic diarrhea. Seen by PT with no recommendations.   DVT prophylaxis: Eliquis Code Status: Full code Family Communication: Spouse updated who is present at bedside  Disposition Plan:  Status is: Inpatient  Remains inpatient appropriate because:Persistent severe electrolyte disturbances, IV treatments appropriate due to intensity of illness or inability to take PO and Inpatient level of care appropriate due to severity of illness   Dispo: The patient is from: Home              Anticipated d/c is to: Home              Anticipated d/c date is: 1 day              Patient currently is not medically stable to d/c.  Consultants:   None  Procedures:   None  Antimicrobials:   None   Subjective: Patient seen and examined bedside, resting comfortably. Nursing  reports  4 episodes of diarrhea over the past 24 hours. Spouse present at bedside. Updated that electrolytes are stable this morning. Offered starting cholestyramine to assist with diarrhea. Hopefully electrolytes remained stable and may discharge home tomorrow with outpatient follow-up with GI. No other questions or concerns at this time. Denies headache, no visual changes, no chest pain, no palpitations, no shortness of breath, no abdominal pain, no current nausea/vomiting, no fever/chills/night sweats, no weakness, no fatigue, no paresthesias. No acute events overnight per nursing staff.  Objective: Vitals:   08/27/20 1800 08/27/20 2003 08/28/20 0307 08/28/20 1114  BP:  (!) 145/62 129/62 (!) 117/57  Pulse:  60 (!) 58 (!) 57  Resp: 15 16 20 17   Temp:  97.8 F (36.6 C) 98.1 F (36.7 C) 98.1 F (36.7 C)  TempSrc:  Oral Oral Oral  SpO2:  97% 95% 96%  Weight:   79.4 kg   Height:        Intake/Output Summary (Last 24 hours) at 08/28/2020 1152 Last data filed at 08/28/2020 0846 Gross per 24 hour  Intake 1222.64 ml  Output 850 ml  Net 372.64 ml   Filed Weights   08/27/20 1247 08/28/20 0307  Weight: 79 kg 79.4 kg    Examination:  General exam: Appears calm and comfortable  Respiratory system: Clear to auscultation. Respiratory effort normal. Oxygenating well on room air Cardiovascular system: S1 & S2 heard, RRR. No JVD, murmurs, rubs, gallops or clicks. No pedal edema. Gastrointestinal system: Abdomen is nondistended, soft and nontender. No organomegaly or masses felt. Normal bowel sounds heard. Central nervous system: Alert and oriented. No focal neurological deficits. Extremities: Symmetric 5 x 5 power. Skin: No rashes, lesions or ulcers Psychiatry: Judgement and insight appear normal. Mood & affect appropriate.     Data Reviewed: I have personally reviewed following labs and imaging studies  CBC: Recent Labs  Lab 08/26/20 1800 08/27/20 0443 08/28/20 0256  WBC 10.0 9.1  7.4  NEUTROABS  --   --  5.0  HGB 12.1* 11.4* 10.6*  HCT 36.7* 33.1* 30.8*  MCV 94.1 90.9 91.7  PLT 211 192 390   Basic Metabolic Panel: Recent Labs  Lab 08/26/20 1800 08/27/20 0217 08/27/20 0443 08/27/20 1323 08/28/20 0256  NA 139  --  139 139 138  K 2.9*  --  2.8* 3.9 4.4  CL 102  --  101 103 106  CO2 25  --  25 23 21*  GLUCOSE 112*  --  91 120* 88  BUN 12  --  11 10 9   CREATININE 1.30*  --  1.14 0.96 0.97  CALCIUM 6.7*  --  7.1* 7.4* 7.2*  MG 0.4* 0.9* 0.9* 1.3* 2.4   GFR: Estimated Creatinine Clearance: 64.6 mL/min (by C-G formula based on SCr of 0.97 mg/dL). Liver Function Tests: Recent Labs  Lab 08/27/20 0443 08/28/20 0256  AST 16 20  ALT 12 10  ALKPHOS 45 43  BILITOT 1.3* 1.5*  PROT 5.9* 5.5*  ALBUMIN 3.0* 2.8*   No results for input(s): LIPASE, AMYLASE in the last 168 hours. No results for input(s): AMMONIA in the last 168 hours. Coagulation Profile: Recent Labs  Lab 08/26/20 1800  INR 1.5*   Cardiac Enzymes: No results for input(s): CKTOTAL, CKMB, CKMBINDEX, TROPONINI in the last 168 hours. BNP (last 3 results) No results for input(s): PROBNP in the last 8760 hours. HbA1C: No results for input(s): HGBA1C in the last 72 hours. CBG: No results for input(s): GLUCAP in the last 168 hours. Lipid  Profile: No results for input(s): CHOL, HDL, LDLCALC, TRIG, CHOLHDL, LDLDIRECT in the last 72 hours. Thyroid Function Tests: No results for input(s): TSH, T4TOTAL, FREET4, T3FREE, THYROIDAB in the last 72 hours. Anemia Panel: No results for input(s): VITAMINB12, FOLATE, FERRITIN, TIBC, IRON, RETICCTPCT in the last 72 hours. Sepsis Labs: No results for input(s): PROCALCITON, LATICACIDVEN in the last 168 hours.  Recent Results (from the past 240 hour(s))  Respiratory Panel by RT PCR (Flu A&B, Covid) - Nasopharyngeal Swab     Status: None   Collection Time: 08/27/20  4:43 AM   Specimen: Nasopharyngeal Swab; Nasopharyngeal(NP) swabs in vial transport medium    Result Value Ref Range Status   SARS Coronavirus 2 by RT PCR NEGATIVE NEGATIVE Final    Comment: (NOTE) SARS-CoV-2 target nucleic acids are NOT DETECTED.  The SARS-CoV-2 RNA is generally detectable in upper respiratoy specimens during the acute phase of infection. The lowest concentration of SARS-CoV-2 viral copies this assay can detect is 131 copies/mL. A negative result does not preclude SARS-Cov-2 infection and should not be used as the sole basis for treatment or other patient management decisions. A negative result may occur with  improper specimen collection/handling, submission of specimen other than nasopharyngeal swab, presence of viral mutation(s) within the areas targeted by this assay, and inadequate number of viral copies (<131 copies/mL). A negative result must be combined with clinical observations, patient history, and epidemiological information. The expected result is Negative.  Fact Sheet for Patients:  PinkCheek.be  Fact Sheet for Healthcare Providers:  GravelBags.it  This test is no t yet approved or cleared by the [place] FDA and  has been authorized for detection and/or diagnosis of SARS-CoV-2 by FDA under an Emergency Use Authorization (EUA). This EUA will remain  in effect (meaning this test can be used) for the duration of the COVID-19 declaration under Section 564(b)(1) of the Act, 21 U.S.C. section 360bbb-3(b)(1), unless the authorization is terminated or revoked sooner.     Influenza A by PCR NEGATIVE NEGATIVE Final   Influenza B by PCR NEGATIVE NEGATIVE Final    Comment: (NOTE) The Xpert Xpress SARS-CoV-2/FLU/RSV assay is intended as an aid in  the diagnosis of influenza from Nasopharyngeal swab specimens and  should not be used as a sole basis for treatment. Nasal washings and  aspirates are unacceptable for Xpert Xpress SARS-CoV-2/FLU/RSV  testing.  Fact Sheet for  Patients: PinkCheek.be  Fact Sheet for Healthcare Providers: GravelBags.it  This test is not yet approved or cleared by the [place] FDA and  has been authorized for detection and/or diagnosis of SARS-CoV-2 by  FDA under an Emergency Use Authorization (EUA). This EUA will remain  in effect (meaning this test can be used) for the duration of the  Covid-19 declaration under Section 564(b)(1) of the Act, 21  U.S.C. section 360bbb-3(b)(1), unless the authorization is  terminated or revoked. Performed at [facility], [place] [address] [place], [place] [address]          Radiology Studies: DG Chest 2 View  Result Date: 08/26/2020 CLINICAL DATA:  Chest pain, tingling all over body X 1 day. Diarrhea X 4 weeks. EXAM: CHEST - 2 VIEW COMPARISON:  Chest x-ray 06/04/2020, CT chest 03/26/2011 FINDINGS: The heart size and mediastinal contours are within normal limits. Flattening of bilateral hemidiaphragms consistent with emphysematous changes. Right basilar scarring/atelectasis. Biapical, right greater than left, pleural/pulmonary scarring. No focal consolidation. No pulmonary edema. No pleural effusion. No pneumothorax. No acute osseous abnormality. Partially  visualized cervical spine surgical hardware. IMPRESSION: 1. No acute cardiopulmonary abnormality. 2. If clinically appropriate, please evaluate patient on age and smoking history to determine if patient meets the inclusion criteria for specialized low-dose lung cancer screening chest CT. Electronically Signed   By: Iven Finn M.D.   On: 08/26/2020 19:08        Scheduled Meds: . apixaban  5 mg Oral BID  . brimonidine  1 drop Left Eye BID  . cholestyramine  4 g Oral BID  . latanoprost  1 drop Left Eye QHS  . magnesium oxide  400 mg Oral BID  . melatonin  5 mg Oral QHS  . metoprolol tartrate  25 mg Oral BID  . pantoprazole  40 mg Oral Daily  . propafenone  225 mg  Oral BID  . sodium chloride flush  3 mL Intravenous Q12H  . tamsulosin  0.4 mg Oral QPM   Continuous Infusions: . sodium chloride 250 mL (08/27/20 1320)     LOS: 1 day    Time spent: 37 minutes spent on chart review, discussion with nursing staff, consultants, updating family and interview/physical exam; more than 50% of that time was spent in counseling and/or coordination of care.    Ashir Kunz J [place] DO [facility] Available via Epic secure chat 7am-7pm After these hours, please refer to coverage provider listed on [email or web address] 08/28/2020, 11:52 AM

## 2020-08-28 NOTE — Evaluation (Signed)
Physical Therapy Evaluation Patient Details Name: Ryan [name] MRN: [id number] DOB: Dec 18, 1945 Today's Date: 08/28/2020   History of Present Illness  Ryan [name] is a 74 y.o. male with medical history significant of adjustment disorder, anxiety, A. fib on Eliquis, COPD, hypertension, GERD, hyperlipidemia, peripheral arterial disease, chronic nausea vomiting diarrhea, OSA presents with chest pain. Found to have hypokalemia and hypomagnesemia.   Clinical Impression  Prior to admission, pt lives with his wife and is active. He enjoys walking and riding his motorcycle. Pt presents with mildly decreased endurance, ambulating 200 feet with no assistive device without physical assist. HR stable 60-70's, SpO2 96% on RA. Noted mild dynamic instability, with pt intermittently reaching for wall railing; recommended use of cane for unlevel surfaces. Education also provided on endurance strategies and activity recommendations. Pt/pt spouse with no further questions or concerns. No further acute PT needs. Thank you for this consult.    Follow Up Recommendations No PT follow up    Equipment Recommendations  None recommended by PT    Recommendations for Other Services       Precautions / Restrictions Precautions Precautions: None Restrictions Weight Bearing Restrictions: No      Mobility  Bed Mobility Overal bed mobility: Independent                  Transfers Overall transfer level: Independent Equipment used: None                Ambulation/Gait Ambulation/Gait assistance: Modified independent (Device/Increase time) Gait Distance (Feet): 200 Feet Assistive device: None Gait Pattern/deviations: Step-through pattern;Antalgic;Decreased stride length Gait velocity: decreased   General Gait Details: Mildly antalgic gait pattern due to baseline LLD, reaching intermittently for wall railing for support. No physical assist required  Stairs            Wheelchair  Mobility    Modified Rankin (Stroke Patients Only)       Balance Overall balance assessment: Mild deficits observed, not formally tested                                           Pertinent Vitals/Pain Pain Assessment: No/denies pain    Home Living Family/patient expects to be discharged to:: Private residence Living Arrangements: Spouse/significant other Available Help at Discharge: Family;Available 24 hours/day Type of Home: House Home Access: Stairs to enter Entrance Stairs-Rails: Right Entrance Stairs-Number of Steps: 2 Home Layout: Multi-level;Able to live on main level with bedroom/bathroom;Laundry or work area in [place]: Environmental consultant - 2 wheels;Walker - standard;Cane - single point;Cane - quad;Bedside commode;Shower seat - built in;Grab bars - tub/shower      Prior Function Level of Independence: Independent         Comments: Pt is a retired Corporate treasurer for [employer].  He enjoys riding motorcycles as well as Therapist, nutritional.  He has a work shop at his home, and stays very active      Hand Dominance   Dominant Hand: Right    Extremity/Trunk Assessment   Upper Extremity Assessment Upper Extremity Assessment: Overall WFL for tasks assessed    Lower Extremity Assessment Lower Extremity Assessment: RLE deficits/detail RLE Deficits / Details: Pt reports RLE longer than LLE    Cervical / Trunk Assessment Cervical / Trunk Assessment: Normal  Communication   Communication: No difficulties  Cognition Arousal/Alertness: Awake/alert Behavior During Therapy: WFL for tasks  assessed/performed Overall Cognitive Status: Within Functional Limits for tasks assessed                                        General Comments      Exercises     Assessment/Plan    PT Assessment Patent does not need any further PT services  PT Problem List         PT Treatment Interventions      PT Goals (Current goals  can be found in the Care Plan section)  Acute Rehab PT Goals Patient Stated Goal: resolve chronic diarrhea, stay active PT Goal Formulation: All assessment and education complete, DC therapy    Frequency     Barriers to discharge        Co-evaluation               AM-PAC PT "6 Clicks" Mobility  Outcome Measure Help needed turning from your back to your side while in a flat bed without using bedrails?: None Help needed moving from lying on your back to sitting on the side of a flat bed without using bedrails?: None Help needed moving to and from a bed to a chair (including a wheelchair)?: None Help needed standing up from a chair using your arms (e.g., wheelchair or bedside chair)?: None Help needed to walk in hospital room?: None Help needed climbing 3-5 steps with a railing? : A Little 6 Click Score: 23    End of Session   Activity Tolerance: Patient tolerated treatment well Patient left: in bed;with call bell/phone within reach;with family/visitor present Nurse Communication: Mobility status PT Visit Diagnosis: Other abnormalities of gait and mobility (R26.89)    Time: 0131-4388 PT Time Calculation (min) (ACUTE ONLY): 26 min   Charges:   PT Evaluation $PT Eval Moderate Complexity: 1 Mod PT Treatments $Therapeutic Activity: 8-22 mins        Wyona Almas, PT, DPT Acute Rehabilitation Services Pager [phone] Office [phone]   Deno Etienne 08/28/2020, 11:47 AM

## 2020-08-28 NOTE — Telephone Encounter (Signed)
FYI Spoke with patient and I advised him that we usually do a hospital follow up visit and then order those tests.  Patient is hoping to go home tomorrow.  Patient is scheduled for a hospital follow up with Percell Miller for 12/6 and I did make a note for the labs.  Patient also wants you to know that he has had severe diarrhea and he has an appointment on 12/7 with gastro.

## 2020-08-29 LAB — BASIC METABOLIC PANEL
Anion gap: 10 (ref 5–15)
BUN: 6 mg/dL — ABNORMAL LOW (ref 8–23)
CO2: 22 mmol/L (ref 22–32)
Calcium: 7.4 mg/dL — ABNORMAL LOW (ref 8.9–10.3)
Chloride: 107 mmol/L (ref 98–111)
Creatinine, Ser: 1.15 mg/dL (ref 0.61–1.24)
GFR, Estimated: >60 mL/min (ref >60)
Glucose, Bld: 96 mg/dL (ref 70–99)
Potassium: 4 mmol/L (ref 3.5–5.1)
Sodium: 139 mmol/L (ref 135–145)

## 2020-08-29 LAB — MAGNESIUM: Magnesium: 1.9 mg/dL (ref 1.7–2.4)

## 2020-08-29 MED ORDER — MAGNESIUM OXIDE 400 (241.3 MG) MG PO TABS: 400.0000 mg | ORAL_TABLET | Freq: Two times a day (BID) | ORAL | 0 refills | Status: AC

## 2020-08-29 MED ORDER — CHOLESTYRAMINE 4 G PO PACK: 4.0000 g | PACK | Freq: Two times a day (BID) | ORAL | 0 refills | Status: DC

## 2020-08-29 MED ORDER — LOPERAMIDE HCL 2 MG PO CAPS: 2.0000 mg | ORAL_CAPSULE | Freq: Four times a day (QID) | ORAL | 0 refills | Status: DC | PRN

## 2020-08-29 NOTE — Progress Notes (Signed)
Wife is at the bedside. AM VS are stable and AM meds have been given. Went over discharge instructions with patient and patient verbalizes understanding of follow up appointments. IV has been removed and there is no tele monitor on the patient. Patient is getting dressed.

## 2020-08-29 NOTE — Discharge Summary (Signed)
Physician Discharge Summary  [name] ULA:[id number] DOB: [birth year] DOA: 08/26/2020  PCP: Ryan Lukes, MD  Admit date: 08/26/2020 Discharge date: 08/29/2020  Admitted From: Home Disposition: Home  Recommendations for Outpatient Follow-up:  1. Follow up with PCP in 1-2 weeks; patient has follow-up scheduled on 09/09/2020 2. Follow-up with gastroenterology, Dr. Earlean [name]; patient has follow-up scheduled on 09/10/2020 3. Please obtain BMP and magnesium level in one week 4. Started on Questran 4 g p.o. twice daily and Imodium prn for chronic diarrhea 5. Started on magnesium 4 mg p.o. twice daily for hypomagnesemia  Home Health: No Equipment/Devices: None  Discharge Condition: Stable CODE STATUS: Full code Diet recommendation: Heart healthy diet  History of present illness:  Ryan [name] is a 74 year old male with past medical history significant for adjustment disorder, anxiety, paroxysmal atrial fibrillation on Eliquis, COPD, essential hypertension, GERD, hyperlipidemia, peripheral artery disease, chronic nausea, vomiting and diarrhea, OSA who presented to the emergency department with chest pain, dizziness, lightheadedness. Patient attributes symptoms to new medication (Lomotil) prescribed for his chronic diarrhea by his GI doctor (Dr. Earlean [name]).  Upon presentation to the ED, troponins were flat 15, 13. Potassium 2.9 with a magnesium of 0.4. He was started on electrolyte replacement. Hospitalist service consulted for further evaluation and management.  Hospital course:  Hypokalemia Hypomagnesemia Patient presenting with dizziness, weakness. Was found to have electrolyte disturbance with potassium of 2.9 and magnesium 0.4. Patient suffers from chronic diarrhea; followed closely by gastroenterology, Dr. Earlean [name]. Per patient, outpatient work-up with multiple colonoscopies with biopsy and lab testing unrevealing. Recently started on Lomotil with questionable reaction. Patient was  given aggressive IV and oral electrolyte replacement during hospitalization.  Diarrhea improved with addition of cholestyramine and Imodium as needed.  Potassium 4.0 magnesium 1.9 at time of discharge.  Continue home oral potassium supplement, started on magnesium 400 mg p.o. twice daily.  Recommend repeat BMP with magnesium level at next PCP/specialist visit.  Chronic diarrhea Patient follows with gastroenterology outpatient, Dr. Arville [name] off. Recently prescribed Lomotil; patient believes possible side effects with dizziness. Extensive outpatient work-up per patient unrevealing.  C. difficile negative on 08/20/2020.  Started on cholestyramine 4 g p.o. twice daily and Imodium as needed with improvement of diarrhea.  Continue close follow-up with gastroenterology, Dr. Earlean [name] scheduled on 09/10/2020.  Atypical chest pain Etiology likely noncardiac. Patient attributes his symptoms to Lomotil use. Troponins flat on admission. EKG without any signs of ischemic changes. Chest pain has resolved. No arrhythmias noted on telemetry while inpatient. No further cardiac work-up needed at this time. Outpatient follow-up with cardiology, Ryan [name] as scheduled  Paroxysmal atrial fibrillation Continue Rythmol, metoprolol, Eliquis  COPD, nonoxygen dependent Continue home albuterol as needed  Chronic diastolic congestive heart failure, compensated TTE 04/05/2020 with LVEF 60-65% with grade 1 diastolic dysfunction.  Continue metoprolol.  May resume furosemide on discharge.  Recommend repeat labs as above.  GERD: Continue PPI  BPH: Continue tamsulosin  Generalized weakness Etiology likely secondary to chronic diarrhea. Seen by PT with no recommendations.  Discharge Diagnoses:  Principal Problem:   Hypomagnesemia Active Problems:   Essential hypertension   COPD GOLD I   GERD   Anticoagulation adequate with anticoagulant therapy   BPH (benign prostatic hyperplasia)   CHF (congestive heart failure)  (HCC)   Edema   Nausea, vomiting, and diarrhea   Hypokalemia   Emphysema lung [facility])    Discharge Instructions  Discharge Instructions    Call MD for:  difficulty breathing, headache or visual disturbances  Complete by: As directed    Call MD for:  extreme fatigue   Complete by: As directed    Call MD for:  persistant dizziness or light-headedness   Complete by: As directed    Call MD for:  persistant nausea and vomiting   Complete by: As directed    Call MD for:  severe uncontrolled pain   Complete by: As directed    Call MD for:  temperature >100.4   Complete by: As directed    Diet - low sodium heart healthy   Complete by: As directed    Increase activity slowly   Complete by: As directed      Allergies as of 08/29/2020      Reactions   Albumin (human) Anaphylaxis   Polymyxin B-trimethoprim Swelling   Eye drops made eyes swell   Pseudoephedrine Other (See Comments)   Stomach cramps   Codeine Hives, Itching, Rash   Guaiacol Other (See Comments)   Hallucinations   Statins Other (See Comments)   Muscle cramps   Meloxicam Other (See Comments)   Unknown reaction   Pseudoephedrine-guaifenesin Nausea And Vomiting   Stomach cramps   Rosuvastatin Calcium Other (See Comments)   Muscle aches   Tapentadol Other (See Comments)   Unknown reaction   Ciprofloxacin Hives, Itching, Nausea Only, Rash   Moxifloxacin Nausea Only, Other (See Comments)   Headaches, stomach cramps   Rofecoxib Other (See Comments)   Stomach cramping      Medication List    TAKE these medications   brimonidine 0.2 % ophthalmic solution Commonly known as: ALPHAGAN Place 1 drop into the left eye in the morning and at bedtime.   cholestyramine 4 g packet Commonly known as: QUESTRAN Take 1 packet (4 g total) by mouth 2 (two) times daily.   clobetasol 0.05 % external solution Commonly known as: TEMOVATE APPLY 1 APPLICATION TOPICALLY TWICE DAILY What changed:   how much to take  how to  take this  when to take this  reasons to take this  additional instructions   Eliquis 5 MG Tabs tablet Generic drug: apixaban TAKE 1 TABLET BY MOUTH  TWICE DAILY What changed: how much to take   fluconazole 100 MG tablet Commonly known as: DIFLUCAN Take 100 mg by mouth as needed (oral flush).   furosemide 20 MG tablet Commonly known as: LASIX TAKE 1 TABLET BY MOUTH  DAILY   ICaps MV Tabs Take 1 tablet by mouth 2 (two) times daily.   latanoprost 0.005 % ophthalmic solution Commonly known as: XALATAN Place 1 drop into the left eye at bedtime.   levalbuterol 45 MCG/ACT inhaler Commonly known as: XOPENEX HFA Inhale 2 puffs into the lungs every 4 (four) hours as needed for wheezing.   loperamide 2 MG capsule Commonly known as: IMODIUM Take 1 capsule (2 mg total) by mouth every 6 (six) hours as needed for diarrhea or loose stools.   LUCENTIS IO Inject 1 Dose into the eye as needed (Macular degeneration).   magnesium oxide 400 (241.3 Mg) MG tablet Commonly known as: MAG-OX Take 1 tablet (400 mg total) by mouth 2 (two) times daily.   metoprolol tartrate 25 MG tablet Commonly known as: LOPRESSOR TAKE 1 TABLET BY MOUTH  TWICE DAILY   nitroGLYCERIN 0.4 MG SL tablet Commonly known as: NITROSTAT Place 1 tablet (0.4 mg total) under the tongue every 5 (five) minutes as needed for chest pain.   omeprazole 20 MG tablet Commonly known as: PRILOSEC OTC Take  20 mg by mouth daily.   potassium chloride 10 MEQ tablet Commonly known as: KLOR-CON TAKE 1 TABLET BY MOUTH  DAILY What changed: how much to take   propafenone 225 MG tablet Commonly known as: RYTHMOL TAKE 1 TABLET BY MOUTH  TWICE DAILY What changed: when to take this   tamsulosin 0.4 MG Caps capsule Commonly known as: FLOMAX Take 0.4 mg by mouth every evening.   Vitamin D3 125 MCG (5000 UT) Caps Take 10,000 Units by mouth every other day.       Follow-up Information    Ryan Lukes, MD. [name] to.    Specialty: Family Medicine Contact information: [address] [place] [address] [phone]        Richmond Campbell, MD. [name] to.   Specialty: Gastroenterology Contact information: [address] [place] [address] [phone]              Allergies  Allergen Reactions  . Albumin (Human) Anaphylaxis  . Polymyxin B-Trimethoprim Swelling    Eye drops made eyes swell  . Pseudoephedrine Other (See Comments)    Stomach cramps  . Codeine Hives, Itching and Rash  . Guaiacol Other (See Comments)    Hallucinations  . Statins Other (See Comments)    Muscle cramps   . Meloxicam Other (See Comments)    Unknown reaction  . Pseudoephedrine-Guaifenesin Nausea And Vomiting    Stomach cramps  . Rosuvastatin Calcium Other (See Comments)    Muscle aches  . Tapentadol Other (See Comments)    Unknown reaction  . Ciprofloxacin Hives, Itching, Nausea Only and Rash  . Moxifloxacin Nausea Only and Other (See Comments)    Headaches, stomach cramps   . Rofecoxib Other (See Comments)    Stomach cramping    Consultations:  None   Procedures/Studies: DG Chest 2 View  Result Date: 08/26/2020 CLINICAL DATA:  Chest pain, tingling all over body X 1 day. Diarrhea X 4 weeks. EXAM: CHEST - 2 VIEW COMPARISON:  Chest x-ray 06/04/2020, CT chest 03/26/2011 FINDINGS: The heart size and mediastinal contours are within normal limits. Flattening of bilateral hemidiaphragms consistent with emphysematous changes. Right basilar scarring/atelectasis. Biapical, right greater than left, pleural/pulmonary scarring. No focal consolidation. No pulmonary edema. No pleural effusion. No pneumothorax. No acute osseous abnormality. Partially visualized cervical spine surgical hardware. IMPRESSION: 1. No acute cardiopulmonary abnormality. 2. If clinically appropriate, please evaluate patient on age and smoking history to determine if patient meets the inclusion criteria for specialized  low-dose lung cancer screening chest CT. Electronically Signed   By: Iven Finn M.D.   On: 08/26/2020 19:08      Subjective: Patient seen and examined at bedside, resting comfortably sitting at edge of bed.  Spouse present.  States his diarrhea has much improved over last 24 hours and not as "loose".  Feels ready for discharge home today.  He has already scheduled outpatient follow-ups with his PCP and gastroenterologist.  Denies headache, no fever/chills/night sweats, no nausea/vomiting, no chest pain, no palpitations, no shortness of breath, no abdominal pain, no weakness, no fatigue, no paresthesias.  No acute events overnight per nursing staff.  Discharge Exam: Vitals:   08/28/20 2004 08/29/20 0451  BP: (!) 163/63 (!) 143/56  Pulse: 64 63  Resp: 17 18  Temp: 98.4 F (36.9 C) 98.6 F (37 C)  SpO2: 99% 95%   Vitals:   08/28/20 1114 08/28/20 2004 08/29/20 0036 08/29/20 0451  BP: (!) 117/57 (!) 163/63  Marland Kitchen)  143/56  Pulse: (!) 57 64  63  Resp: 17 17  18   Temp: 98.1 F (36.7 C) 98.4 F (36.9 C)  98.6 F (37 C)  TempSrc: Oral Oral  Oral  SpO2: 96% 99%  95%  Weight:   80.3 kg   Height:        General: Pt is alert, awake, not in acute distress Cardiovascular: RRR, S1/S2 +, no rubs, no gallops Respiratory: CTA bilaterally, no wheezing, no rhonchi Abdominal: Soft, NT, ND, bowel sounds + Extremities: no edema, no cyanosis    The results of significant diagnostics from this hospitalization (including imaging, microbiology, ancillary and laboratory) are listed below for reference.     Microbiology: Recent Results (from the past 240 hour(s))  Respiratory Panel by RT PCR (Flu A&B, Covid) - Nasopharyngeal Swab     Status: None   Collection Time: 08/27/20  4:43 AM   Specimen: Nasopharyngeal Swab; Nasopharyngeal(NP) swabs in vial transport medium  Result Value Ref Range Status   SARS Coronavirus 2 by RT PCR NEGATIVE NEGATIVE Final    Comment: (NOTE) SARS-CoV-2 target nucleic  acids are NOT DETECTED.  The SARS-CoV-2 RNA is generally detectable in upper respiratoy specimens during the acute phase of infection. The lowest concentration of SARS-CoV-2 viral copies this assay can detect is 131 copies/mL. A negative result does not preclude SARS-Cov-2 infection and should not be used as the sole basis for treatment or other patient management decisions. A negative result may occur with  improper specimen collection/handling, submission of specimen other than nasopharyngeal swab, presence of viral mutation(s) within the areas targeted by this assay, and inadequate number of viral copies (<131 copies/mL). A negative result must be combined with clinical observations, patient history, and epidemiological information. The expected result is Negative.  Fact Sheet for Patients:  PinkCheek.be  Fact Sheet for Healthcare Providers:  GravelBags.it  This test is no t yet approved or cleared by the [place] FDA and  has been authorized for detection and/or diagnosis of SARS-CoV-2 by FDA under an Emergency Use Authorization (EUA). This EUA will remain  in effect (meaning this test can be used) for the duration of the COVID-19 declaration under Section 564(b)(1) of the Act, 21 U.S.C. section 360bbb-3(b)(1), unless the authorization is terminated or revoked sooner.     Influenza A by PCR NEGATIVE NEGATIVE Final   Influenza B by PCR NEGATIVE NEGATIVE Final    Comment: (NOTE) The Xpert Xpress SARS-CoV-2/FLU/RSV assay is intended as an aid in  the diagnosis of influenza from Nasopharyngeal swab specimens and  should not be used as a sole basis for treatment. Nasal washings and  aspirates are unacceptable for Xpert Xpress SARS-CoV-2/FLU/RSV  testing.  Fact Sheet for Patients: PinkCheek.be  Fact Sheet for Healthcare Providers: GravelBags.it  This test  is not yet approved or cleared by the [place] FDA and  has been authorized for detection and/or diagnosis of SARS-CoV-2 by  FDA under an Emergency Use Authorization (EUA). This EUA will remain  in effect (meaning this test can be used) for the duration of the  Covid-19 declaration under Section 564(b)(1) of the Act, 21  U.S.C. section 360bbb-3(b)(1), unless the authorization is  terminated or revoked. Performed at [facility], [place] [address], [place], [place] [address]      Labs: BNP (last 3 results) Recent Labs    05/02/20 2007  BNP 657.8*   Basic Metabolic Panel: Recent Labs  Lab 08/26/20 1800 08/26/20 1800 08/27/20 0217 08/27/20 0443 08/27/20  1323 08/28/20 0256 08/29/20 0219  NA 139  --   --  139 139 138 139  K 2.9*  --   --  2.8* 3.9 4.4 4.0  CL 102  --   --  101 103 106 107  CO2 25  --   --  25 23 21* 22  GLUCOSE 112*  --   --  91 120* 88 96  BUN 12  --   --  11 10 9  6*  CREATININE 1.30*  --   --  1.14 0.96 0.97 1.15  CALCIUM 6.7*  --   --  7.1* 7.4* 7.2* 7.4*  MG 0.4*   < > 0.9* 0.9* 1.3* 2.4 1.9   < > = values in this interval not displayed.   Liver Function Tests: Recent Labs  Lab 08/27/20 0443 08/28/20 0256  AST 16 20  ALT 12 10  ALKPHOS 45 43  BILITOT 1.3* 1.5*  PROT 5.9* 5.5*  ALBUMIN 3.0* 2.8*   No results for input(s): LIPASE, AMYLASE in the last 168 hours. No results for input(s): AMMONIA in the last 168 hours. CBC: Recent Labs  Lab 08/26/20 1800 08/27/20 0443 08/28/20 0256  WBC 10.0 9.1 7.4  NEUTROABS  --   --  5.0  HGB 12.1* 11.4* 10.6*  HCT 36.7* 33.1* 30.8*  MCV 94.1 90.9 91.7  PLT 211 192 185   Cardiac Enzymes: No results for input(s): CKTOTAL, CKMB, CKMBINDEX, TROPONINI in the last 168 hours. BNP: Invalid input(s): POCBNP CBG: No results for input(s): GLUCAP in the last 168 hours. D-Dimer No results for input(s): DDIMER in the last 72 hours. Hgb A1c No results for input(s): HGBA1C in the last 72 hours. Lipid  Profile No results for input(s): CHOL, HDL, LDLCALC, TRIG, CHOLHDL, LDLDIRECT in the last 72 hours. Thyroid function studies No results for input(s): TSH, T4TOTAL, T3FREE, THYROIDAB in the last 72 hours.  Invalid input(s): FREET3 Anemia work up No results for input(s): VITAMINB12, FOLATE, FERRITIN, TIBC, IRON, RETICCTPCT in the last 72 hours. Urinalysis    Component Value Date/Time   COLORURINE YELLOW 04/09/2020 1025   APPEARANCEUR CLEAR 04/09/2020 1025   LABSPEC 1.004 (L) 04/09/2020 1025   PHURINE 7.0 04/09/2020 1025   GLUCOSEU NEGATIVE 04/09/2020 1025   GLUCOSEU NEGATIVE 12/27/2017 1157   HGBUR LARGE (A) 04/09/2020 1025   HGBUR negative 03/15/2009 0804   BILIRUBINUR NEGATIVE 04/09/2020 1025   BILIRUBINUR n 12/01/2013 1136   KETONESUR 5 (A) 04/09/2020 1025   PROTEINUR NEGATIVE 04/09/2020 1025   UROBILINOGEN 0.2 12/27/2017 1157   NITRITE NEGATIVE 04/09/2020 1025   LEUKOCYTESUR SMALL (A) 04/09/2020 1025   Sepsis Labs Invalid input(s): PROCALCITONIN,  WBC,  LACTICIDVEN Microbiology Recent Results (from the past 240 hour(s))  Respiratory Panel by RT PCR (Flu A&B, Covid) - Nasopharyngeal Swab     Status: None   Collection Time: 08/27/20  4:43 AM   Specimen: Nasopharyngeal Swab; Nasopharyngeal(NP) swabs in vial transport medium  Result Value Ref Range Status   SARS Coronavirus 2 by RT PCR NEGATIVE NEGATIVE Final    Comment: (NOTE) SARS-CoV-2 target nucleic acids are NOT DETECTED.  The SARS-CoV-2 RNA is generally detectable in upper respiratoy specimens during the acute phase of infection. The lowest concentration of SARS-CoV-2 viral copies this assay can detect is 131 copies/mL. A negative result does not preclude SARS-Cov-2 infection and should not be used as the sole basis for treatment or other patient management decisions. A negative result may occur with  improper specimen collection/handling, submission  of specimen other than nasopharyngeal swab, presence of viral  mutation(s) within the areas targeted by this assay, and inadequate number of viral copies (<131 copies/mL). A negative result must be combined with clinical observations, patient history, and epidemiological information. The expected result is Negative.  Fact Sheet for Patients:  PinkCheek.be  Fact Sheet for Healthcare Providers:  GravelBags.it  This test is no t yet approved or cleared by the [place] FDA and  has been authorized for detection and/or diagnosis of SARS-CoV-2 by FDA under an Emergency Use Authorization (EUA). This EUA will remain  in effect (meaning this test can be used) for the duration of the COVID-19 declaration under Section 564(b)(1) of the Act, 21 U.S.C. section 360bbb-3(b)(1), unless the authorization is terminated or revoked sooner.     Influenza A by PCR NEGATIVE NEGATIVE Final   Influenza B by PCR NEGATIVE NEGATIVE Final    Comment: (NOTE) The Xpert Xpress SARS-CoV-2/FLU/RSV assay is intended as an aid in  the diagnosis of influenza from Nasopharyngeal swab specimens and  should not be used as a sole basis for treatment. Nasal washings and  aspirates are unacceptable for Xpert Xpress SARS-CoV-2/FLU/RSV  testing.  Fact Sheet for Patients: PinkCheek.be  Fact Sheet for Healthcare Providers: GravelBags.it  This test is not yet approved or cleared by the [place] FDA and  has been authorized for detection and/or diagnosis of SARS-CoV-2 by  FDA under an Emergency Use Authorization (EUA). This EUA will remain  in effect (meaning this test can be used) for the duration of the  Covid-19 declaration under Section 564(b)(1) of the Act, 21  U.S.C. section 360bbb-3(b)(1), unless the authorization is  terminated or revoked. Performed at [facility], [place] [address], [place], [place] [address]      Time coordinating discharge:  Over 30 minutes  SIGNED:   Mattia Liford J [place] DO  [facility] 08/29/2020, 8:10 AM

## 2020-08-29 NOTE — Discharge Instructions (Signed)
Chronic Diarrhea Chronic diarrhea is a condition in which a person passes frequent loose and watery stools for 4 weeks or longer. Non-chronic diarrhea usually lasts for only 2-3 days. Diarrhea can cause a person to feel weak and dehydrated. Dehydration can make the person tired and thirsty. It can also cause a dry mouth, decreased urination, and dark yellow urine. Diarrhea is a sign of an underlying problem, such as:  Infection.  Side effects of medicines.  Problems digesting something in your diet, such as milk products if you have lactose intolerance.  Conditions such as celiac disease, irritable bowel syndrome (IBS), or inflammatory bowel disease (IBD). If you have chronic diarrhea, make sure you treat it as told by your health care provider. Follow these instructions at home: Medicines  Take over-the-counter and prescription medicines only as told by your health care provider.  If you were prescribed an antibiotic medicine, take it as told by your health care provider. Do not stop taking the antibiotic even if you start to feel better. Eating and drinking   Follow instructions from your health care provider about what to eat and drink. You may have to: ? Avoid foods that trigger diarrhea for you. ? Take an oral rehydration solution (ORS). This is a drink that keeps you hydrated. It can be found at pharmacies and retail stores. ? Drink clear fluids, such as water, diluted fruit juice, and low-calorie sports drinks. You can also get fluids by sucking on ice chips. ? Drink enough fluid to keep your urine pale yellow. This will help you avoid dehydration. ? Eat small amounts of bland foods that are easy to digest as you are able. These foods include bananas, applesauce, rice, lean meats, toast, and crackers. ? Avoid spicy or fatty foods. ? Avoid foods and beverages that contain a lot of sugar or caffeine.  Do not drink alcohol if: ? Your health care provider tells you not to  drink. ? You are pregnant, may be pregnant, or are planning to become pregnant.  If you drink alcohol: ? Limit how much you use to:  0-1 drink a day for women.  0-2 drinks a day for men. ? Be aware of how much alcohol is in your drink. In the [place] one drink equals one 12 oz bottle of beer (355 mL), one 5 oz glass of wine (148 mL), or one 1 oz glass of hard liquor (44 mL). General instructions   Wash your hands often and after each diarrhea episode. Use soap and water. If soap and water are not available, use hand sanitizer.  Make sure that all people in your household wash their hands well and often.  Rest as told by your health care provider.  Watch your condition for any changes.  Take a warm bath to relieve any burning or pain from frequent diarrhea episodes.  Keep all follow-up visits as told by your health care provider. This is important. Contact a health care provider if:  You have a fever.  Your diarrhea gets worse or does not get better.  You have new symptoms.  You cannot drink fluid without vomiting.  You feel light-headed or dizzy.  You have a headache.  You have muscle cramps.  You have severe pain in the rectum. Get help right away if:  You have vomiting that does not go away.  You have chest pain.  You feel very weak or you faint.  You have bloody or black stools, or stools that look like tar.  You have severe pain, cramping, or bloating in your abdomen, or pain that stays in one place.  You have trouble breathing or you are breathing very quickly.  Your heart is beating very quickly.  Your skin feels cold and clammy.  You feel confused.  You have a severe headache.  You have signs or symptoms of dehydration, such as: ? Dark urine, very little urine, or no urine. ? Cracked lips. ? Dry mouth. ? Sunken eyes. ? Sleepiness. ? Weakness. These symptoms may represent a serious problem that is an emergency. Do not wait to see if the  symptoms will go away. Get medical help right away. Call your local emergency services (911 in the [place] Do not drive yourself to the hospital. Summary  Chronic diarrhea is a condition in which a person passes frequent loose and watery stools for 4 weeks or longer.  Diarrhea is a sign of an underlying problem.  Make sure you treat your diarrhea as told by your health care provider.  Drink enough fluid to keep your urine pale yellow. This will help you avoid dehydration.  Wash your hands often and after each diarrhea episode. If soap and water are not available, use hand sanitizer. This information is not intended to replace advice given to you by your health care provider. Make sure you discuss any questions you have with your health care provider. Document Revised: 03/20/2019 Document Reviewed: 03/20/2019 Elsevier Patient Education  [place].

## 2020-09-04 DIAGNOSIS — D225 Melanocytic nevi of trunk: Secondary | ICD-10-CM | POA: Diagnosis not present

## 2020-09-04 DIAGNOSIS — L578 Other skin changes due to chronic exposure to nonionizing radiation: Secondary | ICD-10-CM | POA: Diagnosis not present

## 2020-09-04 DIAGNOSIS — L219 Seborrheic dermatitis, unspecified: Secondary | ICD-10-CM | POA: Diagnosis not present

## 2020-09-04 DIAGNOSIS — L821 Other seborrheic keratosis: Secondary | ICD-10-CM | POA: Diagnosis not present

## 2020-09-04 DIAGNOSIS — L570 Actinic keratosis: Secondary | ICD-10-CM | POA: Diagnosis not present

## 2020-09-04 DIAGNOSIS — L719 Rosacea, unspecified: Secondary | ICD-10-CM | POA: Diagnosis not present

## 2020-09-04 DIAGNOSIS — L814 Other melanin hyperpigmentation: Secondary | ICD-10-CM | POA: Diagnosis not present

## 2020-09-04 DIAGNOSIS — L820 Inflamed seborrheic keratosis: Secondary | ICD-10-CM | POA: Diagnosis not present

## 2020-09-05 DIAGNOSIS — Z9889 Other specified postprocedural states: Secondary | ICD-10-CM | POA: Diagnosis not present

## 2020-09-05 DIAGNOSIS — Z961 Presence of intraocular lens: Secondary | ICD-10-CM | POA: Diagnosis not present

## 2020-09-05 DIAGNOSIS — H353231 Exudative age-related macular degeneration, bilateral, with active choroidal neovascularization: Secondary | ICD-10-CM | POA: Diagnosis not present

## 2020-09-05 DIAGNOSIS — Z9842 Cataract extraction status, left eye: Secondary | ICD-10-CM | POA: Diagnosis not present

## 2020-09-05 DIAGNOSIS — Z9841 Cataract extraction status, right eye: Secondary | ICD-10-CM | POA: Diagnosis not present

## 2020-09-09 ENCOUNTER — Other Ambulatory Visit: Payer: Self-pay

## 2020-09-09 ENCOUNTER — Encounter: Payer: Self-pay | Admitting: Medical

## 2020-09-09 ENCOUNTER — Ambulatory Visit (INDEPENDENT_AMBULATORY_CARE_PROVIDER_SITE_OTHER): Payer: Medicare Other | Admitting: Medical

## 2020-09-09 DIAGNOSIS — I480 Paroxysmal atrial fibrillation: Secondary | ICD-10-CM | POA: Diagnosis not present

## 2020-09-09 DIAGNOSIS — I5032 Chronic diastolic (congestive) heart failure: Secondary | ICD-10-CM

## 2020-09-09 DIAGNOSIS — E611 Iron deficiency: Secondary | ICD-10-CM

## 2020-09-09 DIAGNOSIS — E782 Mixed hyperlipidemia: Secondary | ICD-10-CM

## 2020-09-09 DIAGNOSIS — E876 Hypokalemia: Secondary | ICD-10-CM | POA: Diagnosis not present

## 2020-09-09 DIAGNOSIS — D619 Aplastic anemia, unspecified: Secondary | ICD-10-CM | POA: Diagnosis not present

## 2020-09-09 DIAGNOSIS — I10 Essential (primary) hypertension: Secondary | ICD-10-CM

## 2020-09-09 LAB — CBC WITH DIFFERENTIAL/PLATELET
Basophils Absolute: 0.1 10*3/uL (ref 0.0–0.1)
Basophils Relative: 0.7 % (ref 0.0–3.0)
Eosinophils Absolute: 0.1 10*3/uL (ref 0.0–0.7)
Eosinophils Relative: 1.3 % (ref 0.0–5.0)
HCT: 39 % (ref 39.0–52.0)
Hemoglobin: 13.2 g/dL (ref 13.0–17.0)
Lymphocytes Relative: 24.9 % (ref 12.0–46.0)
Lymphs Abs: 2 10*3/uL (ref 0.7–4.0)
MCHC: 33.8 g/dL (ref 30.0–36.0)
MCV: 92.4 fl (ref 78.0–100.0)
Monocytes Absolute: 0.7 10*3/uL (ref 0.1–1.0)
Monocytes Relative: 8.8 % (ref 3.0–12.0)
Neutro Abs: 5.2 10*3/uL (ref 1.4–7.7)
Neutrophils Relative %: 64.3 % (ref 43.0–77.0)
Platelets: 239 10*3/uL (ref 150.0–400.0)
RBC: 4.22 Mil/uL (ref 4.22–5.81)
RDW: 14 % (ref 11.5–15.5)
WBC: 8 10*3/uL (ref 4.0–10.5)

## 2020-09-09 LAB — COMPREHENSIVE METABOLIC PANEL
ALT: 13 U/L (ref 0–53)
AST: 18 U/L (ref 0–37)
Albumin: 4.1 g/dL (ref 3.5–5.2)
Alkaline Phosphatase: 54 U/L (ref 39–117)
BUN: 20 mg/dL (ref 6–23)
CO2: 27 mEq/L (ref 19–32)
Calcium: 9.8 mg/dL (ref 8.4–10.5)
Chloride: 101 mEq/L (ref 96–112)
Creatinine, Ser: 1.26 mg/dL (ref 0.40–1.50)
GFR: 56.52 mL/min — ABNORMAL LOW (ref >60.00)
Glucose, Bld: 87 mg/dL (ref 70–99)
Potassium: 4.9 mEq/L (ref 3.5–5.1)
Sodium: 136 mEq/L (ref 135–145)
Total Bilirubin: 0.7 mg/dL (ref 0.2–1.2)
Total Protein: 7.3 g/dL (ref 6.0–8.3)

## 2020-09-09 LAB — BRAIN NATRIURETIC PEPTIDE: Pro B Natriuretic peptide (BNP): 209 pg/mL — ABNORMAL HIGH (ref 0.0–100.0)

## 2020-09-09 LAB — MAGNESIUM: Magnesium: 1.6 mg/dL (ref 1.5–2.5)

## 2020-09-09 NOTE — Addendum Note (Signed)
Addended by: Anabel Halon on: 09/09/2020 01:50 PM   Modules accepted: Orders

## 2020-09-09 NOTE — Progress Notes (Addendum)
Subjective:    Patient ID: Ryan [name], male    DOB: 26-Dec-1945, 74 y.o.   MRN: [id number]  HPI  Pt in for follow up. He admission to hospital on 08-26-2020 and discharge on 11-25-11-2019.  Recommendations for Outpatient Follow-up:  1. Follow up with PCP in 1-2 weeks; patient has follow-up scheduled on 09/09/2020 2. Follow-up with gastroenterology, Dr. Earlean Shawl; patient has follow-up scheduled on 09/10/2020 3. Please obtain BMP and magnesium level in one week 4. Started on Questran 4 g p.o. twice daily and Imodium prn for chronic diarrhea 5. Started on magnesium 4 mg p.o. twice daily for hypomagnesemia  hpi on dc summary History of present illness:  Ryan [name] a 74 year old male with past medical history significant for adjustment disorder, anxiety, paroxysmal atrial fibrillation on Eliquis, COPD, essential hypertension, GERD, hyperlipidemia, peripheral artery disease, chronic nausea, vomiting and diarrhea, OSA who presented to the emergency department with chest pain, dizziness, lightheadedness. Patient attributes symptoms to new medication(Lomotil)prescribed for his chronic diarrhea by his GI doctor(Dr. Medoff).  Upon presentation to the ED, troponins were flat 15, 13. Potassium 2.9 with a magnesium of 0.4. He was started on electrolyte replacement. Hospitalist service consulted for further evaluation and management.  Hospital course:  Hypokalemia Hypomagnesemia Patient presenting with dizziness, weakness. Was found to have electrolyte disturbance with potassium of 2.9 and magnesium 0.4. Patient suffers from chronic diarrhea;followed closely by gastroenterology, Dr.Medoff.Per patient, outpatient work-up with multiple colonoscopies with biopsy and lab testing unrevealing. Recently started onLomotilwith questionable reaction. Patient was given aggressive IV and oral electrolyte replacement during hospitalization.  Diarrhea improved with addition of cholestyramine and  Imodium as needed.  Potassium 4.0 magnesium 1.9 at time of discharge.  Continue home oral potassium supplement, started on magnesium 400 mg p.o. twice daily.  Recommend repeat BMP with magnesium level at next PCP/specialist visit.  Chronic diarrhea Patient follows with gastroenterology outpatient, Dr. Jackelyn Hoehn has had gallbladder removed). Recently prescribed Lomotil; patient believes possible side effects with dizziness. Extensive outpatient work-up per patient unrevealing.  C. difficile negative on 08/20/2020.  Started on cholestyramine 4 g p.o. twice daily and Imodium as needed with improvement of diarrhea.  Continue close follow-up with gastroenterology, Dr. Earlean Shawl scheduled on 09/10/2020.  Atypical chest pain Etiology likely noncardiac. Patient attributes his symptoms to Lomotil use. Troponins flat on admission. EKG without any signs of ischemic changes. Chest pain has resolved. No arrhythmias noted on telemetry while inpatient. No further cardiac work-up needed at this time. Outpatient follow-up with cardiology, Dr. Lovena Le as scheduled  Paroxysmal atrial fibrillation Continue Rythmol, metoprolol, Eliquis  COPD,nonoxygen dependent Continue home albuterol as needed  Chronic diastolic congestive heart failure, compensated TTE7/2/2021with LVEF 60-65% with grade 1 diastolic dysfunction.  Continue metoprolol.  May resume furosemide on discharge.  Recommend repeat labs as above.  GERD:Continue PPI  NTI:RWERXVQM tamsulosin  Generalized weakness Etiology likely secondary to chronic diarrhea. Seen by PT with no recommendations.  Discharge Diagnoses:  Principal Problem:   Hypomagnesemia Active Problems:   Essential hypertension   COPD GOLD I   GERD   Anticoagulation adequate with anticoagulant therapy   BPH (benign prostatic hyperplasia)   CHF (congestive heart failure) (HCC)   Edema   Nausea, vomiting, and diarrhea   Hypokalemia   Emphysema lung ([place])    Pt update  me states he is only on questran 2 packs today and had one packet yesterday. No leg cramps.   No leg swelling and no dyspnea on exertion. Pt is on lasix.  Review of Systems  Constitutional: Negative for chills, fatigue and fever.  Respiratory: Negative for cough, chest tightness, shortness of breath and wheezing.   Cardiovascular: Negative for chest pain and palpitations.  Gastrointestinal: Positive for diarrhea. Negative for abdominal pain, constipation and vomiting.       Diarrhea is well controlled.  Genitourinary: Negative for dysuria, hematuria, scrotal swelling and urgency.  Musculoskeletal: Negative for back pain, gait problem, joint swelling and neck pain.  Skin: Negative for rash.  Neurological: Negative for dizziness, speech difficulty, weakness, numbness and headaches.  Hematological: Negative for adenopathy. Does not bruise/bleed easily.  Psychiatric/Behavioral: Negative for behavioral problems, confusion, decreased concentration and suicidal ideas. The patient is not nervous/anxious.     Past Medical History:  Diagnosis Date  . A-fib ([place]) 03/22/2018  . Anemia   . Atrial flutter ([place]) 03/31/2012   converted spontaneously to sinus  . CAD (coronary artery disease)    reportedly moderate CAD; managed medically  . CHF (congestive heart failure) ([place])   . COPD (chronic obstructive pulmonary disease) ([place])   . Eczema 08/03/2014  . Edema 03/22/2018  . GERD (gastroesophageal reflux disease)   . H/O: rheumatic fever   . Heart murmur   . Heart murmur   . High risk medication use    on amiodarone since 03/31/2012  . History of colonoscopy   . Hyperglycemia 06/21/2017  . Hyperlipidemia   . Hypertension   . Kidney stone 06/21/2017  . Kidney stones   . Lobar pneumonia ([place]) 03/22/2018  . Low vitamin D level 06/21/2017  . Macular degeneration   . Osteoporosis 05/31/2016     Social History   Socioeconomic History  . Marital status: Married    Spouse name: Not on file    . Number of children: 0  . Years of education: Not on file  . Highest education level: Not on file  Occupational History  . Occupation: retired    Fish farm manager: RETIRED  Tobacco Use  . Smoking status: Former Smoker    Packs/day: 0.50    Years: 50.00    Pack years: 25.00    Types: Cigarettes    Quit date: 05/01/2013    Years since quitting: 7.3  . Smokeless tobacco: Former Systems developer  . Tobacco comment: using e-Cig//ldc  Vaping Use  . Vaping Use: Never used  Substance and Sexual Activity  . Alcohol use: No  . Drug use: No  . Sexual activity: Yes    Comment: lives with wife, no dietary restrictions  Other Topics Concern  . Not on file  Social History Narrative  . Not on file   Social Determinants of Health   Financial Resource Strain:   . Difficulty of Paying Living Expenses: Not on file  Food Insecurity:   . Worried About Charity fundraiser in the Last Year: Not on file  . Ran Out of Food in the Last Year: Not on file  Transportation Needs:   . Lack of Transportation (Medical): Not on file  . Lack of Transportation (Non-Medical): Not on file  Physical Activity:   . Days of Exercise per Week: Not on file  . Minutes of Exercise per Session: Not on file  Stress:   . Feeling of Stress : Not on file  Social Connections:   . Frequency of Communication with Friends and Family: Not on file  . Frequency of Social Gatherings with Friends and Family: Not on file  . Attends Religious Services: Not on file  . Active Member of  Clubs or Organizations: Not on file  . Attends Archivist Meetings: Not on file  . Marital Status: Not on file  Intimate Partner Violence:   . Fear of Current or Ex-Partner: Not on file  . Emotionally Abused: Not on file  . Physically Abused: Not on file  . Sexually Abused: Not on file    Past Surgical History:  Procedure Laterality Date  . APPENDECTOMY    . CARDIAC CATHETERIZATION  04/22/2011   moderate left main and RCA stenosis not significant  by FFR and IVUS on medical therapy  . CARDIOVERSION N/A 04/25/2018   Procedure: CARDIOVERSION;  Surgeon: Skeet Latch, MD;  Location: [facility] ENDOSCOPY;  Service: Cardiovascular;  Laterality: N/A;  . CERVICAL DISCECTOMY     C5,6,7 disc fused  with plate and 5 1" screws  . CHOLECYSTECTOMY    . colon polyp removal  3/1.19  . FOOT SURGERY     right calcification removed from top of foot  . knee cartiledge Right    right knee  . MOUTH SURGERY     periodontal surgery, bridges, splint in front,     Family History  Problem Relation Age of Onset  . Heart disease Mother   . Diabetes Mother   . Cirrhosis Mother   . Emphysema Mother        never smoked but 2nd hand through her spouse  . Hypertension Mother   . Macular degeneration Mother   . Heart disease Father   . Cancer Father        prostate  . Hyperlipidemia Father   . Hypertension Father   . Varicose Veins Father   . Heart attack Father   . Peripheral vascular disease Father   . Heart disease Sister   . Arthritis Sister   . Hyperlipidemia Sister   . Obesity Sister   . Macular degeneration Sister   . Heart disease Brother        5 stents  . Hyperlipidemia Brother   . Macular degeneration Maternal Grandfather   . Cirrhosis Sister   . Obesity Sister   . Arthritis Sister   . Heart disease Sister   . Obesity Sister   . Liver disease Other   . Prostate cancer Other   . Coronary artery disease Other     Allergies  Allergen Reactions  . Albumin (Human) Anaphylaxis  . Polymyxin B-Trimethoprim Swelling    Eye drops made eyes swell  . Pseudoephedrine Other (See Comments)    Stomach cramps  . Codeine Hives, Itching and Rash  . Guaiacol Other (See Comments)    Hallucinations  . Statins Other (See Comments)    Muscle cramps   . Meloxicam Other (See Comments)    Unknown reaction  . Pseudoephedrine-Guaifenesin Nausea And Vomiting    Stomach cramps  . Rosuvastatin Calcium Other (See Comments)    Muscle aches  .  Tapentadol Other (See Comments)    Unknown reaction  . Ciprofloxacin Hives, Itching, Nausea Only and Rash  . Moxifloxacin Nausea Only and Other (See Comments)    Headaches, stomach cramps   . Rofecoxib Other (See Comments)    Stomach cramping    Current Outpatient Medications on File Prior to Visit  Medication Sig Dispense Refill  . brimonidine (ALPHAGAN) 0.2 % ophthalmic solution Place 1 drop into the left eye in the morning and at bedtime.   7  . Cholecalciferol (VITAMIN D3) 5000 units CAPS Take 10,000 Units by mouth every other day.     Marland Kitchen  cholestyramine (QUESTRAN) 4 g packet Take 1 packet (4 g total) by mouth 2 (two) times daily. 180 packet 0  . clobetasol (TEMOVATE) 0.05 % external solution APPLY 1 APPLICATION TOPICALLY TWICE DAILY (Patient taking differently: Apply 1 application topically 2 (two) times daily as needed (irritation). APPLY 1 APPLICATION TOPICALLY TWICE DAILY AS NEEDED) 100 mL 3  . ELIQUIS 5 MG TABS tablet TAKE 1 TABLET BY MOUTH  TWICE DAILY (Patient taking differently: Take 5 mg by mouth 2 (two) times daily. ) 180 tablet 3  . fluconazole (DIFLUCAN) 100 MG tablet Take 100 mg by mouth as needed (oral flush).    . furosemide (LASIX) 20 MG tablet TAKE 1 TABLET BY MOUTH  DAILY (Patient taking differently: Take 20 mg by mouth daily. ) 90 tablet 3  . latanoprost (XALATAN) 0.005 % ophthalmic solution Place 1 drop into the left eye at bedtime.     . levalbuterol (XOPENEX HFA) 45 MCG/ACT inhaler Inhale 2 puffs into the lungs every 4 (four) hours as needed for wheezing. 45 g 2  . loperamide (IMODIUM) 2 MG capsule Take 1 capsule (2 mg total) by mouth every 6 (six) hours as needed for diarrhea or loose stools. 30 capsule 0  . magnesium oxide (MAG-OX) 400 (241.3 Mg) MG tablet Take 1 tablet (400 mg total) by mouth 2 (two) times daily. 180 tablet 0  . metoprolol tartrate (LOPRESSOR) 25 MG tablet TAKE 1 TABLET BY MOUTH  TWICE DAILY (Patient taking differently: Take 25 mg by mouth 2 (two)  times daily. ) 180 tablet 3  . Multiple Vitamins-Minerals (ICAPS MV) TABS Take 1 tablet by mouth 2 (two) times daily.     . nitroGLYCERIN (NITROSTAT) 0.4 MG SL tablet Place 1 tablet (0.4 mg total) under the tongue every 5 (five) minutes as needed for chest pain. 30 tablet 0  . omeprazole (PRILOSEC OTC) 20 MG tablet Take 20 mg by mouth daily.    . potassium chloride (KLOR-CON) 10 MEQ tablet TAKE 1 TABLET BY MOUTH  DAILY (Patient taking differently: Take 10 mEq by mouth daily. ) 90 tablet 3  . propafenone (RYTHMOL) 225 MG tablet TAKE 1 TABLET BY MOUTH  TWICE DAILY (Patient taking differently: Take 225 mg by mouth in the morning and at bedtime. ) 180 tablet 2  . Ranibizumab (LUCENTIS IO) Inject 1 Dose into the eye as needed (Macular degeneration).     . tamsulosin (FLOMAX) 0.4 MG CAPS capsule Take 0.4 mg by mouth every evening.     No current facility-administered medications on file prior to visit.    BP 118/70   Pulse (!) 51   Temp 98.4 F (36.9 C) (Oral)   Resp 16   Ht 5\' 4"  (1.626 m)   Wt 175 lb 3.2 oz (79.5 kg)   SpO2 98%   BMI 30.07 kg/m       Objective:   Physical Exam  General Mental Status- Alert. General Appearance- Not in acute distress.   Skin General: Color- Normal Color. Moisture- Normal Moisture.  Neck Carotid Arteries- Normal color. Moisture- Normal Moisture. No carotid bruits. No JVD.  Chest and Lung Exam Auscultation: Breath Sounds:-Normal. No dyspnea.   Cardiovascular Auscultation:Rythm- Regular. Murmurs & Other Heart Sounds:Auscultation of the heart reveals- No Murmurs.  Abdomen Inspection:-Inspeection Normal. Palpation/Percussion:Note:No mass. Palpation and Percussion of the abdomen reveal- Non Tender, Non Distended + BS, no rebound or guarding.    Neurologic Cranial Nerve exam:- CN III-XII intact(No nystagmus), symmetric smile. Strength:- 5/5 equal and symmetric strength both  upper and lower extremities.      Assessment & Plan:   Hypokalemia Hypomagnesemia Patient presenting with dizziness, weakness. Was found to have electrolyte disturbance with potassium of 2.9 and magnesium 0.4. Patient suffers from chronic diarrhea;followed closely by gastroenterology, Dr.Medoff.Per patient, outpatient work-up with multiple colonoscopies with biopsy and lab testing unrevealing. Check cmp with mg level today. Pt will see Dr. Earlean Shawl tomorrow.  Chronic diarrhea Patient follows with gastroenterology outpatient, Dr. Arville Go off. Recently prescribed Lomotil; patient believes possible side effects with dizziness. Extensive outpatient work-up per patient unrevealing.  C. difficile negative on 08/20/2020.  Started on cholestyramine 4 g p.o. twice daily and Imodium as needed with improvement of diarrhea.  Continue close follow-up with gastroenterology, Dr. Earlean Shawl scheduled on 09/10/2020. Diarrhea is relatively well controlled now.  Atypical chest pain resolved/not returned. Etiology likely noncardiac. Patient attributes his symptoms to Lomotil use.  Outpatient follow-up with cardiology, Dr. Lovena Le as scheduled  Paroxysmal atrial fibrillation Continue Rythmol, metoprolol, Eliquis  COPD,nonoxygen dependent Continue home albuterol as needed  Chronic diastolic congestive heart failure, compensated TTE7/2/2021with LVEF 60-65% with grade 1 diastolic dysfunction.  Continue metoprolol.  May resume furosemide on discharge.  Recommend repeat labs as above.  GERD:Continue PPI  VOH:KGOVPCHE tamsulosin  Anemia history- repeat cbc and iron level today.  Follow up date to be determined after lab review and after review of Dr. Earlean Shawl notes.    Mackie Pai, PA-C   Time spent with patient today was  40  minutes which consisted of chart review, discussing diagnosis, work up treatment and documentation.

## 2020-09-09 NOTE — Patient Instructions (Addendum)
Hypokalemia Hypomagnesemia Repeat mg today. Outpatient work-up with multiple colonoscopies with biopsy and lab testing unrevealing. Check cmp with mg level today. Pt will see Dr. Earlean Shawl tomorrow.  Chronic diarrhea Patient follows with gastroenterology outpatient, Extensive outpatient work-up per patient unrevealing.  C. difficile negative on 08/20/2020.  Started on cholestyramine 4 g p.o. twice daily and Imodium as needed with improvement of diarrhea.  Continue close follow-up with gastroenterology, Dr. Earlean Shawl scheduled on 09/10/2020. Diarrhea is relatively well controlled now.  Atypical chest pain resolved/not returned. Etiology likely noncardiac. Patient attributes his symptoms to Lomotil use.  Outpatient follow-up with cardiology, Dr. Lovena Le as scheduled  Paroxysmal atrial fibrillation Continue Rythmol, metoprolol, Eliquis  COPD,nonoxygen dependent Continue home albuterol as needed  Chronic diastolic congestive heart failure, compensated Clinically stable..  Recommend repeat labs as above. Will add bnp  GERD:Continue PPI  DJM:EQASTMHD tamsulosin  Anemia history- repeat cbc and iron level today.  Follow up date to be determined after lab review and after review of Dr. Earlean Shawl notes.

## 2020-09-10 DIAGNOSIS — K529 Noninfective gastroenteritis and colitis, unspecified: Secondary | ICD-10-CM | POA: Diagnosis not present

## 2020-09-10 DIAGNOSIS — Z86010 Personal history of colonic polyps: Secondary | ICD-10-CM | POA: Diagnosis not present

## 2020-09-10 LAB — IRON: Iron: 92 ug/dL (ref 42–165)

## 2020-09-11 ENCOUNTER — Encounter: Payer: Self-pay | Admitting: Medical

## 2020-09-15 ENCOUNTER — Other Ambulatory Visit: Payer: Self-pay | Admitting: Internal Medicine

## 2020-09-16 ENCOUNTER — Other Ambulatory Visit: Payer: Self-pay

## 2020-09-16 MED ORDER — FUROSEMIDE 20 MG PO TABS
20.0000 mg | ORAL_TABLET | Freq: Every day | ORAL | 3 refills | Status: DC
Start: 2020-09-16 — End: 2021-05-16

## 2020-09-17 ENCOUNTER — Other Ambulatory Visit: Payer: Self-pay | Admitting: *Deleted

## 2020-09-17 MED ORDER — POTASSIUM CHLORIDE CRYS ER 10 MEQ PO TBCR
10.0000 meq | EXTENDED_RELEASE_TABLET | Freq: Every day | ORAL | 2 refills | Status: DC
Start: 2020-09-17 — End: 2021-06-10

## 2020-09-25 NOTE — Addendum Note (Signed)
Addended by: Kelle Darting A on: 09/25/2020 03:53 PM   Modules accepted: Orders

## 2020-10-02 ENCOUNTER — Other Ambulatory Visit (INDEPENDENT_AMBULATORY_CARE_PROVIDER_SITE_OTHER): Payer: Medicare Other

## 2020-10-02 ENCOUNTER — Other Ambulatory Visit: Payer: Self-pay

## 2020-10-02 DIAGNOSIS — R739 Hyperglycemia, unspecified: Secondary | ICD-10-CM | POA: Diagnosis not present

## 2020-10-02 DIAGNOSIS — I10 Essential (primary) hypertension: Secondary | ICD-10-CM

## 2020-10-02 DIAGNOSIS — E782 Mixed hyperlipidemia: Secondary | ICD-10-CM

## 2020-10-02 DIAGNOSIS — E559 Vitamin D deficiency, unspecified: Secondary | ICD-10-CM | POA: Diagnosis not present

## 2020-10-02 LAB — VITAMIN D 25 HYDROXY (VIT D DEFICIENCY, FRACTURES): VITD: 47.89 ng/mL (ref 30.00–100.00)

## 2020-10-02 LAB — COMPREHENSIVE METABOLIC PANEL
ALT: 11 U/L (ref 0–53)
AST: 13 U/L (ref 0–37)
Albumin: 4 g/dL (ref 3.5–5.2)
Alkaline Phosphatase: 54 U/L (ref 39–117)
BUN: 22 mg/dL (ref 6–23)
CO2: 28 mEq/L (ref 19–32)
Calcium: 9.5 mg/dL (ref 8.4–10.5)
Chloride: 104 mEq/L (ref 96–112)
Creatinine, Ser: 1.38 mg/dL (ref 0.40–1.50)
GFR: 50.66 mL/min — ABNORMAL LOW (ref >60.00)
Glucose, Bld: 78 mg/dL (ref 70–99)
Potassium: 4.6 mEq/L (ref 3.5–5.1)
Sodium: 139 mEq/L (ref 135–145)
Total Bilirubin: 0.5 mg/dL (ref 0.2–1.2)
Total Protein: 6.7 g/dL (ref 6.0–8.3)

## 2020-10-02 LAB — LIPID PANEL
Cholesterol: 220 mg/dL — ABNORMAL HIGH (ref 0–200)
HDL: 37.8 mg/dL — ABNORMAL LOW (ref >39.00)
LDL Cholesterol: 153 mg/dL — ABNORMAL HIGH (ref 0–99)
NonHDL: 181.83
Total CHOL/HDL Ratio: 6
Triglycerides: 142 mg/dL (ref 0.0–149.0)
VLDL: 28.4 mg/dL (ref 0.0–40.0)

## 2020-10-02 LAB — HEMOGLOBIN A1C: Hgb A1c MFr Bld: 5.1 % (ref 4.6–6.5)

## 2020-10-02 LAB — CBC
HCT: 37.3 % — ABNORMAL LOW (ref 39.0–52.0)
Hemoglobin: 12.5 g/dL — ABNORMAL LOW (ref 13.0–17.0)
MCHC: 33.4 g/dL (ref 30.0–36.0)
MCV: 91.9 fl (ref 78.0–100.0)
Platelets: 236 10*3/uL (ref 150.0–400.0)
RBC: 4.06 Mil/uL — ABNORMAL LOW (ref 4.22–5.81)
RDW: 13.1 % (ref 11.5–15.5)
WBC: 8.5 10*3/uL (ref 4.0–10.5)

## 2020-10-02 LAB — MAGNESIUM: Magnesium: 1.5 mg/dL (ref 1.5–2.5)

## 2020-10-02 LAB — TSH: TSH: 2.46 u[IU]/mL (ref 0.35–4.50)

## 2020-10-11 DIAGNOSIS — M48062 Spinal stenosis, lumbar region with neurogenic claudication: Secondary | ICD-10-CM | POA: Diagnosis not present

## 2020-10-17 DIAGNOSIS — Z9842 Cataract extraction status, left eye: Secondary | ICD-10-CM | POA: Diagnosis not present

## 2020-10-17 DIAGNOSIS — H353231 Exudative age-related macular degeneration, bilateral, with active choroidal neovascularization: Secondary | ICD-10-CM | POA: Diagnosis not present

## 2020-10-17 DIAGNOSIS — Z9841 Cataract extraction status, right eye: Secondary | ICD-10-CM | POA: Diagnosis not present

## 2020-10-17 DIAGNOSIS — Z961 Presence of intraocular lens: Secondary | ICD-10-CM | POA: Diagnosis not present

## 2020-11-06 ENCOUNTER — Telehealth: Payer: Self-pay | Admitting: Family Medicine

## 2020-11-06 ENCOUNTER — Other Ambulatory Visit: Payer: Self-pay

## 2020-11-06 DIAGNOSIS — D539 Nutritional anemia, unspecified: Secondary | ICD-10-CM

## 2020-11-06 NOTE — Telephone Encounter (Signed)
Pt is scheduled for labs 11/07/20 and orders are in

## 2020-11-06 NOTE — Telephone Encounter (Signed)
Ok to check a Magnesium level but would also like to recheck his cbc due to anemia and a vitamin B 12 level or macrocytic anemia. Please order and arrange blood work

## 2020-11-06 NOTE — Telephone Encounter (Signed)
Would like to recheck his magnesium before we reorder this medication

## 2020-11-06 NOTE — Telephone Encounter (Signed)
Patient states that he needs a refill for Magnesium 400, however [facility] fax over a request form to [facility]. [facility] needs a respond so they  Can complete the request  Please advice

## 2020-11-07 ENCOUNTER — Other Ambulatory Visit (INDEPENDENT_AMBULATORY_CARE_PROVIDER_SITE_OTHER): Payer: Medicare Other

## 2020-11-07 ENCOUNTER — Other Ambulatory Visit: Payer: Self-pay

## 2020-11-07 DIAGNOSIS — D539 Nutritional anemia, unspecified: Secondary | ICD-10-CM | POA: Diagnosis not present

## 2020-11-07 DIAGNOSIS — M5136 Other intervertebral disc degeneration, lumbar region: Secondary | ICD-10-CM | POA: Diagnosis not present

## 2020-11-07 DIAGNOSIS — M4726 Other spondylosis with radiculopathy, lumbar region: Secondary | ICD-10-CM | POA: Diagnosis not present

## 2020-11-07 DIAGNOSIS — M48062 Spinal stenosis, lumbar region with neurogenic claudication: Secondary | ICD-10-CM | POA: Diagnosis not present

## 2020-11-08 LAB — COMPREHENSIVE METABOLIC PANEL
ALT: 12 U/L (ref 0–53)
AST: 15 U/L (ref 0–37)
Albumin: 4 g/dL (ref 3.5–5.2)
Alkaline Phosphatase: 64 U/L (ref 39–117)
BUN: 27 mg/dL — ABNORMAL HIGH (ref 6–23)
CO2: 25 mEq/L (ref 19–32)
Calcium: 9.7 mg/dL (ref 8.4–10.5)
Chloride: 105 mEq/L (ref 96–112)
Creatinine, Ser: 1.33 mg/dL (ref 0.40–1.50)
GFR: 52.91 mL/min — ABNORMAL LOW (ref >60.00)
Glucose, Bld: 89 mg/dL (ref 70–99)
Potassium: 4.8 mEq/L (ref 3.5–5.1)
Sodium: 139 mEq/L (ref 135–145)
Total Bilirubin: 0.6 mg/dL (ref 0.2–1.2)
Total Protein: 6.9 g/dL (ref 6.0–8.3)

## 2020-11-08 LAB — CBC WITH DIFFERENTIAL/PLATELET
Basophils Absolute: 0.1 10*3/uL (ref 0.0–0.1)
Basophils Relative: 1.3 % (ref 0.0–3.0)
Eosinophils Absolute: 0.2 10*3/uL (ref 0.0–0.7)
Eosinophils Relative: 2.9 % (ref 0.0–5.0)
HCT: 38 % — ABNORMAL LOW (ref 39.0–52.0)
Hemoglobin: 12.9 g/dL — ABNORMAL LOW (ref 13.0–17.0)
Lymphocytes Relative: 26 % (ref 12.0–46.0)
Lymphs Abs: 2 10*3/uL (ref 0.7–4.0)
MCHC: 34 g/dL (ref 30.0–36.0)
MCV: 90.3 fl (ref 78.0–100.0)
Monocytes Absolute: 0.8 10*3/uL (ref 0.1–1.0)
Monocytes Relative: 10.6 % (ref 3.0–12.0)
Neutro Abs: 4.6 10*3/uL (ref 1.4–7.7)
Neutrophils Relative %: 59.2 % (ref 43.0–77.0)
Platelets: 206 10*3/uL (ref 150.0–400.0)
RBC: 4.21 Mil/uL — ABNORMAL LOW (ref 4.22–5.81)
RDW: 14.4 % (ref 11.5–15.5)
WBC: 7.8 10*3/uL (ref 4.0–10.5)

## 2020-11-08 LAB — VITAMIN B12: Vitamin B-12: 601 pg/mL (ref 211–911)

## 2020-11-08 LAB — MAGNESIUM: Magnesium: 1.6 mg/dL (ref 1.5–2.5)

## 2020-11-10 ENCOUNTER — Other Ambulatory Visit: Payer: Self-pay | Admitting: Internal Medicine

## 2020-11-10 DIAGNOSIS — I4891 Unspecified atrial fibrillation: Secondary | ICD-10-CM

## 2020-11-14 ENCOUNTER — Other Ambulatory Visit: Payer: Medicare Other

## 2020-11-21 ENCOUNTER — Ambulatory Visit (INDEPENDENT_AMBULATORY_CARE_PROVIDER_SITE_OTHER): Payer: Medicare Other | Admitting: Family Medicine

## 2020-11-21 ENCOUNTER — Other Ambulatory Visit: Payer: Self-pay | Admitting: *Deleted

## 2020-11-21 ENCOUNTER — Encounter: Payer: Self-pay | Admitting: Family Medicine

## 2020-11-21 ENCOUNTER — Other Ambulatory Visit: Payer: Self-pay

## 2020-11-21 VITALS — BP 130/78 | HR 61 | Temp 98.0°F | Resp 16 | Wt 173.2 lb

## 2020-11-21 DIAGNOSIS — R197 Diarrhea, unspecified: Secondary | ICD-10-CM | POA: Diagnosis not present

## 2020-11-21 DIAGNOSIS — I10 Essential (primary) hypertension: Secondary | ICD-10-CM

## 2020-11-21 DIAGNOSIS — R7989 Other specified abnormal findings of blood chemistry: Secondary | ICD-10-CM | POA: Diagnosis not present

## 2020-11-21 DIAGNOSIS — E559 Vitamin D deficiency, unspecified: Secondary | ICD-10-CM

## 2020-11-21 DIAGNOSIS — Z86010 Personal history of colonic polyps: Secondary | ICD-10-CM

## 2020-11-21 DIAGNOSIS — H353231 Exudative age-related macular degeneration, bilateral, with active choroidal neovascularization: Secondary | ICD-10-CM | POA: Diagnosis not present

## 2020-11-21 DIAGNOSIS — Z789 Other specified health status: Secondary | ICD-10-CM | POA: Diagnosis not present

## 2020-11-21 DIAGNOSIS — R739 Hyperglycemia, unspecified: Secondary | ICD-10-CM | POA: Diagnosis not present

## 2020-11-21 DIAGNOSIS — E782 Mixed hyperlipidemia: Secondary | ICD-10-CM | POA: Diagnosis not present

## 2020-11-21 DIAGNOSIS — R110 Nausea: Secondary | ICD-10-CM | POA: Diagnosis not present

## 2020-11-21 DIAGNOSIS — R790 Abnormal level of blood mineral: Secondary | ICD-10-CM

## 2020-11-21 LAB — CBC WITH DIFFERENTIAL/PLATELET
Basophils Absolute: 0 10*3/uL (ref 0.0–0.1)
Basophils Relative: 0.3 % (ref 0.0–3.0)
Eosinophils Absolute: 0.1 10*3/uL (ref 0.0–0.7)
Eosinophils Relative: 0.8 % (ref 0.0–5.0)
HCT: 38.4 % — ABNORMAL LOW (ref 39.0–52.0)
Hemoglobin: 13.1 g/dL (ref 13.0–17.0)
Lymphocytes Relative: 19.5 % (ref 12.0–46.0)
Lymphs Abs: 1.9 10*3/uL (ref 0.7–4.0)
MCHC: 34.1 g/dL (ref 30.0–36.0)
MCV: 89.5 fl (ref 78.0–100.0)
Monocytes Absolute: 0.9 10*3/uL (ref 0.1–1.0)
Monocytes Relative: 8.8 % (ref 3.0–12.0)
Neutro Abs: 6.8 10*3/uL (ref 1.4–7.7)
Neutrophils Relative %: 70.6 % (ref 43.0–77.0)
Platelets: 226 10*3/uL (ref 150.0–400.0)
RBC: 4.29 Mil/uL (ref 4.22–5.81)
RDW: 14.6 % (ref 11.5–15.5)
WBC: 9.7 10*3/uL (ref 4.0–10.5)

## 2020-11-21 LAB — COMPREHENSIVE METABOLIC PANEL
ALT: 13 U/L (ref 0–53)
AST: 14 U/L (ref 0–37)
Albumin: 4.1 g/dL (ref 3.5–5.2)
Alkaline Phosphatase: 63 U/L (ref 39–117)
BUN: 23 mg/dL (ref 6–23)
CO2: 27 mEq/L (ref 19–32)
Calcium: 9.9 mg/dL (ref 8.4–10.5)
Chloride: 104 mEq/L (ref 96–112)
Creatinine, Ser: 1.37 mg/dL (ref 0.40–1.50)
GFR: 51.05 mL/min — ABNORMAL LOW (ref >60.00)
Glucose, Bld: 89 mg/dL (ref 70–99)
Potassium: 4.9 mEq/L (ref 3.5–5.1)
Sodium: 139 mEq/L (ref 135–145)
Total Bilirubin: 0.6 mg/dL (ref 0.2–1.2)
Total Protein: 7.1 g/dL (ref 6.0–8.3)

## 2020-11-21 LAB — MAGNESIUM: Magnesium: 1.4 mg/dL — ABNORMAL LOW (ref 1.5–2.5)

## 2020-11-21 MED ORDER — MAGNESIUM GLUCONATE 500 (27 MG) MG PO TABS: 500.0000 mg | ORAL_TABLET | Freq: Every day | ORAL | 3 refills | Status: DC

## 2020-11-21 NOTE — Patient Instructions (Addendum)
Add Benefiber twice a day.   Stop all artificial sweeteners  Magnesium Glycinate 200 ton 400 mg at bedtime for sleep    Diarrhea, Adult Diarrhea is frequent loose and watery bowel movements. Diarrhea can make you feel weak and cause you to become dehydrated. Dehydration can make you tired and thirsty, cause you to have a dry mouth, and decrease how often you urinate. Diarrhea typically lasts 2-3 days. However, it can last longer if it is a sign of something more serious. It is important to treat your diarrhea as told by your health care provider. Follow these instructions at home: Eating and drinking Follow these recommendations as told by your health care provider:  Take an oral rehydration solution (ORS). This is an over-the-counter medicine that helps return your body to its normal balance of nutrients and water. It is found at pharmacies and retail stores.  Drink plenty of fluids, such as water, ice chips, diluted fruit juice, and low-calorie sports drinks. You can drink milk also, if desired.  Avoid drinking fluids that contain a lot of sugar or caffeine, such as energy drinks, sports drinks, and soda.  Eat bland, easy-to-digest foods in small amounts as you are able. These foods include bananas, applesauce, rice, lean meats, toast, and crackers.  Avoid alcohol.  Avoid spicy or fatty foods.      Medicines  Take over-the-counter and prescription medicines only as told by your health care provider.  If you were prescribed an antibiotic medicine, take it as told by your health care provider. Do not stop using the antibiotic even if you start to feel better. General instructions  Wash your hands often using soap and water. If soap and water are not available, use a hand sanitizer. Others in the household should wash their hands as well. Hands should be washed: ? After using the toilet or changing a diaper. ? Before preparing, cooking, or serving food. ? While caring for a sick  person or while visiting someone in a hospital.  Drink enough fluid to keep your urine pale yellow.  Rest at home while you recover.  Watch your condition for any changes.  Take a warm bath to relieve any burning or pain from frequent diarrhea episodes.  Keep all follow-up visits as told by your health care provider. This is important.   Contact a health care provider if:  You have a fever.  Your diarrhea gets worse.  You have new symptoms.  You cannot keep fluids down.  You feel light-headed or dizzy.  You have a headache.  You have muscle cramps. Get help right away if:  You have chest pain.  You feel extremely weak or you faint.  You have bloody or black stools or stools that look like tar.  You have severe pain, cramping, or bloating in your abdomen.  You have trouble breathing or you are breathing very quickly.  Your heart is beating very quickly.  Your skin feels cold and clammy.  You feel confused.  You have signs of dehydration, such as: ? Dark urine, very little urine, or no urine. ? Cracked lips. ? Dry mouth. ? Sunken eyes. ? Sleepiness. ? Weakness. Summary  Diarrhea is frequent loose and watery bowel movements. Diarrhea can make you feel weak and cause you to become dehydrated.  Drink enough fluids to keep your urine pale yellow.  Make sure that you wash your hands after using the toilet. If soap and water are not available, use hand sanitizer.  Contact  a health care provider if your diarrhea gets worse or you have new symptoms.  Get help right away if you have signs of dehydration. This information is not intended to replace advice given to you by your health care provider. Make sure you discuss any questions you have with your health care provider. Document Revised: 02/07/2019 Document Reviewed: 02/25/2018 Elsevier Patient Education  2021 [employer].

## 2020-11-24 DIAGNOSIS — Z86010 Personal history of colonic polyps: Secondary | ICD-10-CM | POA: Insufficient documentation

## 2020-11-24 DIAGNOSIS — R197 Diarrhea, unspecified: Secondary | ICD-10-CM | POA: Insufficient documentation

## 2020-11-24 NOTE — Assessment & Plan Note (Signed)
Supplement and monitor 

## 2020-11-24 NOTE — Assessment & Plan Note (Signed)
Well controlled, no changes to meds. Encouraged heart healthy diet such as the DASH diet and exercise as tolerated.  °

## 2020-11-24 NOTE — Assessment & Plan Note (Signed)
Unable to tolerate statins

## 2020-11-24 NOTE — Progress Notes (Signed)
Subjective:    Patient ID: Ryan [name], male    DOB: [birth year], 75 y.o.   MRN: [id number]  Chief Complaint  Patient presents with  . Follow-up    HPI Patient is in today for follow up on chronic medical concerns. No recent febrile illness or hospitalizations. He is still struggling with several loose stool daily and worse when he eats fatty or greasy food. No bloody or tarry stool. Some cramping and urgency are noted after fatty meals. Denies CP/palp/SOB/HA/congestion/fevers or GU c/o. Taking meds as prescribed  Past Medical History:  Diagnosis Date  . A-fib ([place]) 03/22/2018  . Anemia   . Atrial flutter ([place]) 03/31/2012   converted spontaneously to sinus  . CAD (coronary artery disease)    reportedly moderate CAD; managed medically  . CHF (congestive heart failure) ([place])   . COPD (chronic obstructive pulmonary disease) ([place])   . Eczema 08/03/2014  . Edema 03/22/2018  . GERD (gastroesophageal reflux disease)   . H/O: rheumatic fever   . Heart murmur   . Heart murmur   . High risk medication use    on amiodarone since 03/31/2012  . History of colonoscopy   . Hyperglycemia 06/21/2017  . Hyperlipidemia   . Hypertension   . Kidney stone 06/21/2017  . Kidney stones   . Lobar pneumonia ([place]) 03/22/2018  . Low vitamin D level 06/21/2017  . Macular degeneration   . Osteoporosis 05/31/2016    Past Surgical History:  Procedure Laterality Date  . APPENDECTOMY    . CARDIAC CATHETERIZATION  04/22/2011   moderate left main and RCA stenosis not significant by FFR and IVUS on medical therapy  . CARDIOVERSION N/A 04/25/2018   Procedure: CARDIOVERSION;  Surgeon: Skeet Latch, MD;  Location: [facility] ENDOSCOPY;  Service: Cardiovascular;  Laterality: N/A;  . CERVICAL DISCECTOMY     C5,6,7 disc fused  with plate and 5 1" screws  . CHOLECYSTECTOMY    . colon polyp removal  3/1.19  . FOOT SURGERY     right calcification removed from top of foot  . knee cartiledge Right    right knee  .  MOUTH SURGERY     periodontal surgery, bridges, splint in front,     Family History  Problem Relation Age of Onset  . Heart disease Mother   . Diabetes Mother   . Cirrhosis Mother   . Emphysema Mother        never smoked but 2nd hand through her spouse  . Hypertension Mother   . Macular degeneration Mother   . Heart disease Father   . Cancer Father        prostate  . Hyperlipidemia Father   . Hypertension Father   . Varicose Veins Father   . Heart attack Father   . Peripheral vascular disease Father   . Heart disease Sister   . Arthritis Sister   . Hyperlipidemia Sister   . Obesity Sister   . Macular degeneration Sister   . Heart disease Brother        5 stents  . Hyperlipidemia Brother   . Macular degeneration Maternal Grandfather   . Cirrhosis Sister   . Obesity Sister   . Arthritis Sister   . Heart disease Sister   . Obesity Sister   . Liver disease Other   . Prostate cancer Other   . Coronary artery disease Other     Social History   Socioeconomic History  . Marital status: Married    Spouse  name: Not on file  . Number of children: 0  . Years of education: Not on file  . Highest education level: Not on file  Occupational History  . Occupation: retired    Fish farm manager: RETIRED  Tobacco Use  . Smoking status: Former Smoker    Packs/day: 0.50    Years: 50.00    Pack years: 25.00    Types: Cigarettes    Quit date: 05/01/2013    Years since quitting: 7.5  . Smokeless tobacco: Former Systems developer  . Tobacco comment: using e[employer]ig[employer]ldc  Vaping Use  . Vaping Use: Never used  Substance and Sexual Activity  . Alcohol use: No  . Drug use: No  . Sexual activity: Yes    Comment: lives with wife, no dietary restrictions  Other Topics Concern  . Not on file  Social History Narrative  . Not on file   Social Determinants of Health   Financial Resource Strain: Not on file  Food Insecurity: Not on file  Transportation Needs: Not on file  Physical Activity: Not on file   Stress: Not on file  Social Connections: Not on file  Intimate Partner Violence: Not on file    Outpatient Medications Prior to Visit  Medication Sig Dispense Refill  . brimonidine (ALPHAGAN) 0.2 % ophthalmic solution Place 1 drop into the left eye in the morning and at bedtime.   7  . Cholecalciferol (VITAMIN D3) 5000 units CAPS Take 10,000 Units by mouth every other day.     . cholestyramine (QUESTRAN) 4 g packet Take 1 packet (4 g total) by mouth 2 (two) times daily. 180 packet 0  . clobetasol (TEMOVATE) 0.05 % external solution APPLY 1 APPLICATION TOPICALLY TWICE DAILY (Patient taking differently: Apply 1 application topically 2 (two) times daily as needed (irritation). APPLY 1 APPLICATION TOPICALLY TWICE DAILY AS NEEDED) 100 mL 3  . ELIQUIS 5 MG TABS tablet TAKE 1 TABLET BY MOUTH  TWICE DAILY (Patient taking differently: Take 5 mg by mouth 2 (two) times daily.) 180 tablet 3  . furosemide (LASIX) 20 MG tablet Take 1 tablet (20 mg total) by mouth daily. 90 tablet 3  . latanoprost (XALATAN) 0.005 % ophthalmic solution Place 1 drop into the left eye at bedtime.     . levalbuterol (XOPENEX HFA) 45 MCG/ACT inhaler Inhale 2 puffs into the lungs every 4 (four) hours as needed for wheezing. 45 g 2  . loperamide (IMODIUM) 2 MG capsule Take 1 capsule (2 mg total) by mouth every 6 (six) hours as needed for diarrhea or loose stools. 30 capsule 0  . magnesium oxide (MAG-OX) 400 (241.3 Mg) MG tablet Take 1 tablet (400 mg total) by mouth 2 (two) times daily. 180 tablet 0  . metoprolol tartrate (LOPRESSOR) 25 MG tablet TAKE 1 TABLET BY MOUTH  TWICE DAILY (Patient taking differently: Take 25 mg by mouth 2 (two) times daily.) 180 tablet 3  . Multiple Vitamins-Minerals (ICAPS MV) TABS Take 1 tablet by mouth 2 (two) times daily.    . nitroGLYCERIN (NITROSTAT) 0.4 MG SL tablet Place 1 tablet (0.4 mg total) under the tongue every 5 (five) minutes as needed for chest pain. 30 tablet 0  . omeprazole (PRILOSEC OTC)  20 MG tablet Take 20 mg by mouth daily.    . potassium chloride (KLOR-CON) 10 MEQ tablet Take 1 tablet (10 mEq total) by mouth daily. 90 tablet 2  . propafenone (RYTHMOL) 225 MG tablet TAKE 1 TABLET BY MOUTH  TWICE DAILY 180 tablet 2  .  Ranibizumab (LUCENTIS IO) Inject 1 Dose into the eye as needed (Macular degeneration).     . tamsulosin (FLOMAX) 0.4 MG CAPS capsule Take 0.4 mg by mouth every evening.    . fluconazole (DIFLUCAN) 100 MG tablet Take 100 mg by mouth as needed (oral flush).     No facility-administered medications prior to visit.    Allergies  Allergen Reactions  . Albumin (Human) Anaphylaxis  . Polymyxin B-Trimethoprim Swelling    Eye drops made eyes swell  . Pseudoephedrine Other (See Comments)    Stomach cramps  . Codeine Hives, Itching and Rash  . Guaiacol Other (See Comments)    Hallucinations  . Statins Other (See Comments)    Muscle cramps   . Meloxicam Other (See Comments)    Unknown reaction  . Pseudoephedrine-Guaifenesin Nausea And Vomiting    Stomach cramps  . Rosuvastatin Calcium Other (See Comments)    Muscle aches  . Tapentadol Other (See Comments)    Unknown reaction  . Ciprofloxacin Hives, Itching, Nausea Only and Rash  . Moxifloxacin Nausea Only and Other (See Comments)    Headaches, stomach cramps   . Rofecoxib Other (See Comments)    Stomach cramping    Review of Systems  Constitutional: Negative for fever and malaise/fatigue.  HENT: Negative for congestion.   Eyes: Negative for blurred vision.  Respiratory: Negative for shortness of breath.   Cardiovascular: Negative for chest pain, palpitations and leg swelling.  Gastrointestinal: Positive for diarrhea and heartburn. Negative for abdominal pain, blood in stool and nausea.  Genitourinary: Negative for dysuria and frequency.  Musculoskeletal: Negative for falls.  Skin: Negative for rash.  Neurological: Negative for dizziness, loss of consciousness and headaches.  Endo/Heme/Allergies:  Negative for environmental allergies.  Psychiatric/Behavioral: Negative for depression. The patient is nervous/anxious.        Objective:    Physical Exam  BP 130/78   Pulse 61   Temp 98 F (36.7 C)   Resp 16   Wt 173 lb 3.2 oz (78.6 kg)   SpO2 96%   BMI 29.73 kg/m  Wt Readings from Last 3 Encounters:  11/21/20 173 lb 3.2 oz (78.6 kg)  09/09/20 175 lb 3.2 oz (79.5 kg)  08/29/20 177 lb (80.3 kg)    Diabetic Foot Exam - Simple   No data filed    Lab Results  Component Value Date   WBC 9.7 11/21/2020   HGB 13.1 11/21/2020   HCT 38.4 (L) 11/21/2020   PLT 226.0 11/21/2020   GLUCOSE 89 11/21/2020   CHOL 220 (H) 10/02/2020   TRIG 142.0 10/02/2020   HDL 37.80 (L) 10/02/2020   LDLDIRECT 134.2 12/01/2013   LDLCALC 153 (H) 10/02/2020   ALT 13 11/21/2020   AST 14 11/21/2020   NA 139 11/21/2020   K 4.9 11/21/2020   CL 104 11/21/2020   CREATININE 1.37 11/21/2020   BUN 23 11/21/2020   CO2 27 11/21/2020   TSH 2.46 10/02/2020   PSA 1.64 01/14/2015   INR 1.5 (H) 08/26/2020   HGBA1C 5.1 10/02/2020    Lab Results  Component Value Date   TSH 2.46 10/02/2020   Lab Results  Component Value Date   WBC 9.7 11/21/2020   HGB 13.1 11/21/2020   HCT 38.4 (L) 11/21/2020   MCV 89.5 11/21/2020   PLT 226.0 11/21/2020   Lab Results  Component Value Date   NA 139 11/21/2020   K 4.9 11/21/2020   CO2 27 11/21/2020   GLUCOSE 89 11/21/2020  BUN 23 11/21/2020   CREATININE 1.37 11/21/2020   BILITOT 0.6 11/21/2020   ALKPHOS 63 11/21/2020   AST 14 11/21/2020   ALT 13 11/21/2020   PROT 7.1 11/21/2020   ALBUMIN 4.1 11/21/2020   CALCIUM 9.9 11/21/2020   ANIONGAP 10 08/29/2020   GFR 51.05 (L) 11/21/2020   Lab Results  Component Value Date   CHOL 220 (H) 10/02/2020   Lab Results  Component Value Date   HDL 37.80 (L) 10/02/2020   Lab Results  Component Value Date   LDLCALC 153 (H) 10/02/2020   Lab Results  Component Value Date   TRIG 142.0 10/02/2020   Lab Results   Component Value Date   CHOLHDL 6 10/02/2020   Lab Results  Component Value Date   HGBA1C 5.1 10/02/2020       Assessment & Plan:   Problem List Items Addressed This Visit    Hyperlipidemia, mixed    Encouraged heart healthy diet, increase exercise, avoid trans fats, consider a krill oil cap daily      Essential hypertension    Well controlled, no changes to meds. Encouraged heart healthy diet such as the DASH diet and exercise as tolerated.       RESOLVED: Low vitamin D level    Supplement and monitor      Hyperglycemia    minimize simple carbs. Increase exercise as tolerated.      Vitamin D deficiency    Supplement and monitor      Statin intolerance    Unable to tolerate statins      Hypomagnesemia    Started back on supplementation.      Hx of colonic polyps   Relevant Orders   Ambulatory referral to Gastroenterology   Fecal occult blood, imunochemical   Diarrhea - Primary    With intermittent cramps, nausea. No vomiting. No pain today. Previously seen by Dr Earlean Shawl but he is interested in tranferring his care to [facility] as his previous doctor diminishes his practice. For now avoid rich, spicy and fatty foods, hydrate well and report any concenrs.       Relevant Orders   Ambulatory referral to Gastroenterology   Comprehensive metabolic panel (Completed)   CBC with Differential/Platelet (Completed)   Magnesium (Completed)   Fecal occult blood, imunochemical    Other Visit Diagnoses    Nausea       Relevant Orders   Ambulatory referral to Gastroenterology      I have discontinued Oluwadamilola L. Enlow "Leon"'s fluconazole. I am also having him start on Magnesium Gluconate. Additionally, I am having him maintain his ICaps MV, omeprazole, Ranibizumab (LUCENTIS IO), latanoprost, clobetasol, brimonidine, Vitamin D3, nitroGLYCERIN, levalbuterol, metoprolol tartrate, Eliquis, tamsulosin, cholestyramine, loperamide, magnesium oxide, furosemide, potassium chloride, and  propafenone.  Meds ordered this encounter  Medications  . Magnesium Gluconate 500 (27 Mg) MG TABS    Sig: Take 1 tablet (500 mg total) by mouth daily.    Dispense:  30 tablet    Refill:  3     Penni Homans, MD

## 2020-11-24 NOTE — Assessment & Plan Note (Signed)
Started back on supplementation.

## 2020-11-24 NOTE — Assessment & Plan Note (Addendum)
With intermittent cramps, nausea. No vomiting. No pain today. Previously seen by Dr Earlean Shawl but he is interested in tranferring his care to [facility] as his previous doctor diminishes his practice. For now avoid rich, spicy and fatty foods, hydrate well and report any concenrs.

## 2020-11-24 NOTE — Assessment & Plan Note (Signed)
Encouraged heart healthy diet, increase exercise, avoid trans fats, consider a krill oil cap daily 

## 2020-11-24 NOTE — Assessment & Plan Note (Signed)
minimize simple carbs. Increase exercise as tolerated.  

## 2020-11-25 ENCOUNTER — Telehealth: Payer: Self-pay | Admitting: Gastroenterology

## 2020-11-25 ENCOUNTER — Encounter: Payer: Self-pay | Admitting: Gastroenterology

## 2020-11-25 NOTE — Telephone Encounter (Signed)
Hi Dr. Bryan Lemma,  We received a referral from PCP for patient to be seen for diarrhea, nausea and discuss having a colonoscopy. Is a patient of Dr. Earlean Shawl who is retiring records are in [place] for review.  Please advise on scheduling for patient is currently on for March 15th for an office visit.   Thank you

## 2020-11-25 NOTE — Telephone Encounter (Signed)
Chart reviewed.  Was last seen in 09/2020 with plan for colonoscopy.  Does not appear this has been completed.  Okay to proceed with OV as scheduled on 12/17/2020 with me.  Thank you.

## 2020-11-26 ENCOUNTER — Other Ambulatory Visit (INDEPENDENT_AMBULATORY_CARE_PROVIDER_SITE_OTHER): Payer: Medicare Other

## 2020-11-26 DIAGNOSIS — Z86010 Personal history of colonic polyps: Secondary | ICD-10-CM | POA: Diagnosis not present

## 2020-11-26 DIAGNOSIS — R197 Diarrhea, unspecified: Secondary | ICD-10-CM | POA: Diagnosis not present

## 2020-11-26 LAB — FECAL OCCULT BLOOD, IMMUNOCHEMICAL: Fecal Occult Bld: NEGATIVE

## 2020-12-02 ENCOUNTER — Other Ambulatory Visit: Payer: Self-pay

## 2020-12-02 DIAGNOSIS — R197 Diarrhea, unspecified: Secondary | ICD-10-CM

## 2020-12-02 DIAGNOSIS — E782 Mixed hyperlipidemia: Secondary | ICD-10-CM

## 2020-12-02 MED ORDER — LOPERAMIDE HCL 2 MG PO CAPS: 2.0000 mg | ORAL_CAPSULE | Freq: Four times a day (QID) | ORAL | 0 refills | Status: DC | PRN

## 2020-12-02 MED ORDER — CHOLESTYRAMINE 4 G PO PACK: 4.0000 g | PACK | Freq: Two times a day (BID) | ORAL | 0 refills | Status: DC

## 2020-12-02 NOTE — Addendum Note (Signed)
Addended by: Randolm Idol A on: 12/02/2020 11:38 AM   Modules accepted: Orders

## 2020-12-12 ENCOUNTER — Other Ambulatory Visit: Payer: Self-pay | Admitting: Family Medicine

## 2020-12-12 ENCOUNTER — Other Ambulatory Visit: Payer: Self-pay

## 2020-12-12 ENCOUNTER — Telehealth: Payer: Self-pay

## 2020-12-12 ENCOUNTER — Other Ambulatory Visit (INDEPENDENT_AMBULATORY_CARE_PROVIDER_SITE_OTHER): Payer: Medicare Other

## 2020-12-12 DIAGNOSIS — R790 Abnormal level of blood mineral: Secondary | ICD-10-CM

## 2020-12-12 LAB — MAGNESIUM: Magnesium: 0.8 mg/dL — CL (ref 1.5–2.5)

## 2020-12-12 MED ORDER — MAGNESIUM GLUCONATE 500 (27 MG) MG PO TABS
500.0000 mg | ORAL_TABLET | Freq: Every day | ORAL | 1 refills | Status: DC
Start: 2020-12-12 — End: 2020-12-15

## 2020-12-12 MED ORDER — MAGNESIUM GLUCONATE 500 (27 MG) MG PO TABS: 500.0000 mg | ORAL_TABLET | Freq: Every day | ORAL | 3 refills | Status: DC

## 2020-12-12 NOTE — Telephone Encounter (Signed)
CRITICAL VALUE STICKER  CRITICAL VALUE: Magnesium 0.8  RECEIVER (on-site recipient of call): [place] NOTIFIED: 12/12/20 at 12:46 pm  MESSENGER (representative from lab): Verdene Lennert  MD NOTIFIED: Charlett Blake   TIME OF NOTIFICATION: 12:49 pm  RESPONSE:

## 2020-12-12 NOTE — Telephone Encounter (Signed)
See lab note.  Patient notified.

## 2020-12-12 NOTE — Telephone Encounter (Signed)
magnesium is very low. I gave hime Magnesium Gluconate last month but my guess is he stopped taking it confirm and make sure he understands it is vitally importatnt he take it today. would have him take 2 tabs a day for next 3 days and then can drop to 1 a day but he has to stay on it. if he is still on it he should increase to 2 a day every day and stay on it. then we need to repeat magnesium level again tomorrow to make sure it is important it is to his heart. if he develops chest pain or palpitations he needs to go to the ER. I have sent in refill on meds

## 2020-12-13 ENCOUNTER — Telehealth: Payer: Self-pay | Admitting: *Deleted

## 2020-12-13 ENCOUNTER — Other Ambulatory Visit (INDEPENDENT_AMBULATORY_CARE_PROVIDER_SITE_OTHER): Payer: Medicare Other

## 2020-12-13 DIAGNOSIS — R790 Abnormal level of blood mineral: Secondary | ICD-10-CM

## 2020-12-13 DIAGNOSIS — R197 Diarrhea, unspecified: Secondary | ICD-10-CM

## 2020-12-13 LAB — MAGNESIUM: Magnesium: 0.9 mg/dL — CL (ref 1.5–2.5)

## 2020-12-13 MED ORDER — LOPERAMIDE HCL 2 MG PO CAPS: 2.0000 mg | ORAL_CAPSULE | Freq: Four times a day (QID) | ORAL | 0 refills | Status: DC | PRN

## 2020-12-13 NOTE — Telephone Encounter (Signed)
CRITICAL VALUE STICKER  CRITICAL VALUE: MAGNESIUM  DATE & TIME NOTIFIED: 12/13/2020 101PM  MESSENGER (representative from lab): [facility]

## 2020-12-13 NOTE — Telephone Encounter (Signed)
While patient should go to ER with refusal will increase magnesium supplementation and recheck magnesium level in 3 days.

## 2020-12-13 NOTE — Telephone Encounter (Signed)
Patient needs to go to ER for magnesium. It did not improve over night despite increasing supplementation

## 2020-12-13 NOTE — Telephone Encounter (Signed)
Spoke with patient regarding lab results.  Patient refuses to go to ER, stating he has been dealing with this issue for long enough.  States he has GI appointment on Tuesday. Denies chest pain or palpitations.  Strongly encouraged ER per PCP direction, continues to refuse.

## 2020-12-13 NOTE — Telephone Encounter (Signed)
Per PCP, patient advised to take 3 Magnesium tabs daily through weekend, and retest level on Monday.  Also advised patient critical low Magnesium may cause severe cardiac dysfunction. Patient verbalized understanding.

## 2020-12-14 ENCOUNTER — Encounter (HOSPITAL_COMMUNITY): Payer: Self-pay

## 2020-12-14 ENCOUNTER — Other Ambulatory Visit: Payer: Self-pay

## 2020-12-14 ENCOUNTER — Observation Stay (HOSPITAL_COMMUNITY)
Admission: EM | Admit: 2020-12-14 | Discharge: 2020-12-15 | Disposition: A | Discharge status: Home / Self Care | Payer: Medicare Other | Attending: Family Medicine | Admitting: Family Medicine

## 2020-12-14 DIAGNOSIS — Z20822 Contact with and (suspected) exposure to covid-19: Secondary | ICD-10-CM | POA: Insufficient documentation

## 2020-12-14 DIAGNOSIS — I110 Hypertensive heart disease with heart failure: Secondary | ICD-10-CM | POA: Insufficient documentation

## 2020-12-14 DIAGNOSIS — R197 Diarrhea, unspecified: Secondary | ICD-10-CM | POA: Diagnosis present

## 2020-12-14 DIAGNOSIS — R079 Chest pain, unspecified: Secondary | ICD-10-CM | POA: Diagnosis not present

## 2020-12-14 DIAGNOSIS — R42 Dizziness and giddiness: Secondary | ICD-10-CM | POA: Diagnosis not present

## 2020-12-14 DIAGNOSIS — K529 Noninfective gastroenteritis and colitis, unspecified: Secondary | ICD-10-CM | POA: Diagnosis not present

## 2020-12-14 DIAGNOSIS — J449 Chronic obstructive pulmonary disease, unspecified: Secondary | ICD-10-CM | POA: Diagnosis not present

## 2020-12-14 DIAGNOSIS — H3530 Unspecified macular degeneration: Secondary | ICD-10-CM | POA: Diagnosis present

## 2020-12-14 DIAGNOSIS — Z7901 Long term (current) use of anticoagulants: Secondary | ICD-10-CM | POA: Insufficient documentation

## 2020-12-14 DIAGNOSIS — Z79899 Other long term (current) drug therapy: Secondary | ICD-10-CM | POA: Insufficient documentation

## 2020-12-14 DIAGNOSIS — I2510 Atherosclerotic heart disease of native coronary artery without angina pectoris: Secondary | ICD-10-CM | POA: Insufficient documentation

## 2020-12-14 DIAGNOSIS — I10 Essential (primary) hypertension: Secondary | ICD-10-CM | POA: Diagnosis present

## 2020-12-14 DIAGNOSIS — Z87891 Personal history of nicotine dependence: Secondary | ICD-10-CM | POA: Diagnosis not present

## 2020-12-14 DIAGNOSIS — I5032 Chronic diastolic (congestive) heart failure: Secondary | ICD-10-CM | POA: Insufficient documentation

## 2020-12-14 DIAGNOSIS — R001 Bradycardia, unspecified: Secondary | ICD-10-CM | POA: Diagnosis not present

## 2020-12-14 DIAGNOSIS — N400 Enlarged prostate without lower urinary tract symptoms: Secondary | ICD-10-CM | POA: Diagnosis present

## 2020-12-14 DIAGNOSIS — R0602 Shortness of breath: Secondary | ICD-10-CM | POA: Diagnosis not present

## 2020-12-14 DIAGNOSIS — R0789 Other chest pain: Secondary | ICD-10-CM | POA: Diagnosis not present

## 2020-12-14 DIAGNOSIS — I480 Paroxysmal atrial fibrillation: Secondary | ICD-10-CM | POA: Diagnosis present

## 2020-12-14 DIAGNOSIS — R531 Weakness: Secondary | ICD-10-CM | POA: Diagnosis present

## 2020-12-14 LAB — CBC WITH DIFFERENTIAL/PLATELET
Abs Immature Granulocytes: 0.06 10*3/uL (ref 0.00–0.07)
Basophils Absolute: 0.1 10*3/uL (ref 0.0–0.1)
Basophils Relative: 1 %
Eosinophils Absolute: 0.1 10*3/uL (ref 0.0–0.5)
Eosinophils Relative: 1 %
HCT: 38.2 % — ABNORMAL LOW (ref 39.0–52.0)
Hemoglobin: 12.7 g/dL — ABNORMAL LOW (ref 13.0–17.0)
Immature Granulocytes: 1 %
Lymphocytes Relative: 22 %
Lymphs Abs: 2.1 10*3/uL (ref 0.7–4.0)
MCH: 30.2 pg (ref 26.0–34.0)
MCHC: 33.2 g/dL (ref 30.0–36.0)
MCV: 90.7 fL (ref 80.0–100.0)
Monocytes Absolute: 0.8 10*3/uL (ref 0.1–1.0)
Monocytes Relative: 8 %
Neutro Abs: 6.4 10*3/uL (ref 1.7–7.7)
Neutrophils Relative %: 67 %
Platelets: 187 10*3/uL (ref 150–400)
RBC: 4.21 MIL/uL — ABNORMAL LOW (ref 4.22–5.81)
RDW: 13.8 % (ref 11.5–15.5)
WBC: 9.4 10*3/uL (ref 4.0–10.5)
nRBC: 0 % (ref 0.0–0.2)

## 2020-12-14 LAB — BASIC METABOLIC PANEL
Anion gap: 9 (ref 5–15)
BUN: 10 mg/dL (ref 8–23)
CO2: 22 mmol/L (ref 22–32)
Calcium: 8 mg/dL — ABNORMAL LOW (ref 8.9–10.3)
Chloride: 105 mmol/L (ref 98–111)
Creatinine, Ser: 1.17 mg/dL (ref 0.61–1.24)
GFR, Estimated: >60 mL/min (ref >60)
Glucose, Bld: 93 mg/dL (ref 70–99)
Potassium: 3.6 mmol/L (ref 3.5–5.1)
Sodium: 136 mmol/L (ref 135–145)

## 2020-12-14 LAB — MAGNESIUM
Magnesium: 0.8 mg/dL — CL (ref 1.7–2.4)
Magnesium: 1.8 mg/dL (ref 1.7–2.4)

## 2020-12-14 LAB — SARS CORONAVIRUS 2 (TAT 6-24 HRS): SARS Coronavirus 2: NEGATIVE

## 2020-12-14 MED ORDER — ACETAMINOPHEN 650 MG RE SUPP: 650.0000 mg | Freq: Four times a day (QID) | RECTAL | Status: DC | PRN

## 2020-12-14 MED ORDER — MAGNESIUM OXIDE 400 (241.3 MG) MG PO TABS
800.0000 mg | ORAL_TABLET | Freq: Once | ORAL | Status: AC
  Administered 2020-12-14: 800 mg via ORAL
  Filled 2020-12-14: qty 2

## 2020-12-14 MED ORDER — MAGNESIUM SULFATE 4 GM/100ML IV SOLN: 4.0000 g | Freq: Once | INTRAVENOUS | Status: DC

## 2020-12-14 MED ORDER — MELATONIN 5 MG PO TABS
5.0000 mg | ORAL_TABLET | Freq: Once | ORAL | Status: AC
  Administered 2020-12-14: 5 mg via ORAL
  Filled 2020-12-14: qty 1

## 2020-12-14 MED ORDER — LEVALBUTEROL HCL 1.25 MG/0.5ML IN NEBU
1.2500 mg | INHALATION_SOLUTION | RESPIRATORY_TRACT | Status: DC | PRN
  Filled 2020-12-14: qty 0.5

## 2020-12-14 MED ORDER — BRIMONIDINE TARTRATE 0.2 % OP SOLN
1.0000 [drp] | Freq: Two times a day (BID) | OPHTHALMIC | Status: DC
  Administered 2020-12-14 – 2020-12-15 (×2): 1 [drp] via OPHTHALMIC
  Filled 2020-12-14: qty 5

## 2020-12-14 MED ORDER — HYDRALAZINE HCL 20 MG/ML IJ SOLN: 5.0000 mg | INTRAMUSCULAR | Status: DC | PRN

## 2020-12-14 MED ORDER — ACETAMINOPHEN 325 MG PO TABS: 650.0000 mg | ORAL_TABLET | Freq: Four times a day (QID) | ORAL | Status: DC | PRN

## 2020-12-14 MED ORDER — LOPERAMIDE HCL 2 MG PO CAPS
2.0000 mg | ORAL_CAPSULE | Freq: Four times a day (QID) | ORAL | Status: DC | PRN
  Administered 2020-12-14 – 2020-12-15 (×3): 2 mg via ORAL
  Filled 2020-12-14 (×3): qty 1

## 2020-12-14 MED ORDER — APIXABAN 5 MG PO TABS
5.0000 mg | ORAL_TABLET | Freq: Two times a day (BID) | ORAL | Status: DC
  Administered 2020-12-14 – 2020-12-15 (×3): 5 mg via ORAL
  Filled 2020-12-14 (×3): qty 1

## 2020-12-14 MED ORDER — SODIUM CHLORIDE 0.9 % IV BOLUS
1000.0000 mL | Freq: Once | INTRAVENOUS | Status: AC
  Administered 2020-12-14: 1000 mL via INTRAVENOUS

## 2020-12-14 MED ORDER — MAGNESIUM SULFATE 2 GM/50ML IV SOLN
2.0000 g | Freq: Once | INTRAVENOUS | Status: AC
  Administered 2020-12-14: 2 g via INTRAVENOUS
  Filled 2020-12-14: qty 50

## 2020-12-14 MED ORDER — MAGNESIUM SULFATE 4 GM/100ML IV SOLN
4.0000 g | Freq: Once | INTRAVENOUS | Status: AC
  Administered 2020-12-14: 4 g via INTRAVENOUS
  Filled 2020-12-14: qty 100

## 2020-12-14 MED ORDER — METOPROLOL TARTRATE 25 MG PO TABS
25.0000 mg | ORAL_TABLET | Freq: Two times a day (BID) | ORAL | Status: DC
  Administered 2020-12-14: 25 mg via ORAL
  Filled 2020-12-14 (×3): qty 1

## 2020-12-14 MED ORDER — PROPAFENONE HCL 225 MG PO TABS
225.0000 mg | ORAL_TABLET | Freq: Two times a day (BID) | ORAL | Status: DC
  Administered 2020-12-14 – 2020-12-15 (×2): 225 mg via ORAL
  Filled 2020-12-14 (×4): qty 1

## 2020-12-14 MED ORDER — CHOLESTYRAMINE 4 G PO PACK
4.0000 g | PACK | Freq: Two times a day (BID) | ORAL | Status: DC
  Administered 2020-12-14 – 2020-12-15 (×3): 4 g via ORAL
  Filled 2020-12-14 (×6): qty 1

## 2020-12-14 MED ORDER — ONDANSETRON HCL 4 MG PO TABS: 4.0000 mg | ORAL_TABLET | Freq: Four times a day (QID) | ORAL | Status: DC | PRN

## 2020-12-14 MED ORDER — LATANOPROST 0.005 % OP SOLN
1.0000 [drp] | Freq: Every day | OPHTHALMIC | Status: DC
  Administered 2020-12-14: 1 [drp] via OPHTHALMIC
  Filled 2020-12-14: qty 2.5

## 2020-12-14 MED ORDER — TAMSULOSIN HCL 0.4 MG PO CAPS
0.4000 mg | ORAL_CAPSULE | Freq: Every evening | ORAL | Status: DC
  Administered 2020-12-14: 0.4 mg via ORAL
  Filled 2020-12-14: qty 1

## 2020-12-14 MED ORDER — ONDANSETRON HCL 4 MG/2ML IJ SOLN: 4.0000 mg | Freq: Four times a day (QID) | INTRAMUSCULAR | Status: DC | PRN

## 2020-12-14 MED ORDER — MAGNESIUM GLUCONATE 500 MG PO TABS
1000.0000 mg | ORAL_TABLET | Freq: Two times a day (BID) | ORAL | Status: DC
  Filled 2020-12-14: qty 2

## 2020-12-14 NOTE — H&P (Addendum)
History and Physical    Ryan [name]:[id number] DOB: Oct 08, 1945 DOA: 12/14/2020  PCP: [name], MD Consultants:  Bryan Lemma - GI; Kendrick - urology Patient coming from:  Home - lives with wife; NOK: Wife, Zacchaeus Halm, [phone]  Chief Complaint:  Weakness  HPI: Ryan [name] is a 75 y.o. male with medical history significant of HTN; HLD; CAD; COPD; chronic diastolic CHF; BPH; and afib on Eliquis presenting with weakness.  He was previously admitted for this issue from 11/22-25; he also had hypokalemia at that time.  This was attributed to chronic diarrhea and he was scheduled for outpatient GI f/u with Dr. Earlean Shawl.  At his last visit on 12/7, ddx included microscopic colitis, lymphocytic colitis, and SCAD and he was planned for colonoscopy.   He was started on Questran with some improvement.  Colonoscopy was not done and he is scheduled for GI f/u with Dr. Bryan Lemma on 3/15.   The patient reports that in July 2021 he developed diarrhea after he ate something.  He had severe diarrhea to the point where he passed out and ended up being hospitalized for 6 days.  His electrolytes were all off.  His diarrhea has never resolved.  In November, it happened again and he was unable to control his diarrhea.  He was hospitalized again and they decided to try to figure out what was causing it.  He was seeing Dr. Earlean Shawl and he saw the PA and started running a lot of tests.  He did have polypectomies in 2019 and 2020.  They were considering another colonoscopy but he didn't hear anything further.  He was referred to see Dr. Bryan Lemma and has an appointment to see him on 3/15.  He has been increasingly lethargic.  Dr. Randel Pigg called yesterday and reported very low Mag++ and she encouraged him to come to the ER.  He felt fine then - but when he woke up overnight he was lightheaded and dizzy with lots of diarrhea and so decided to come.  He has 2-3 stools per day, always loose.  He describes them as  "cow patties" but not liquid.  He is taking the cholestyramine twice a day and does not remember taking it 4 times daily and it causing him to be constipated.    ED Course:  Mag 0.8, ?due to diarrhea and GI losses.  Has appt for colonoscopy soon.  Review of Systems: As per HPI; otherwise review of systems reviewed and negative.   Ambulatory Status:  Ambulates without assistance  COVID Vaccine Status:   Complete plus booster  Past Medical History:  Diagnosis Date  . A-fib ([place]) 03/22/2018  . Anemia   . CAD (coronary artery disease)    reportedly moderate CAD; managed medically  . CHF (congestive heart failure) ([place])   . COPD (chronic obstructive pulmonary disease) ([place])   . Eczema 08/03/2014  . GERD (gastroesophageal reflux disease)   . H/O: rheumatic fever   . Heart murmur   . High risk medication use    on amiodarone since 03/31/2012  . History of colonoscopy   . Hyperglycemia 06/21/2017  . Hyperlipidemia   . Hypertension   . Kidney stone 06/21/2017  . Lobar pneumonia ([place]) 03/22/2018  . Low vitamin D level 06/21/2017  . Macular degeneration   . Osteoporosis 05/31/2016    Past Surgical History:  Procedure Laterality Date  . APPENDECTOMY    . CARDIAC CATHETERIZATION  04/22/2011   moderate left main and RCA stenosis not significant  by [facility] and IVUS on medical therapy  . CARDIOVERSION N/A 04/25/2018   Procedure: CARDIOVERSION;  Surgeon: Skeet Latch, MD;  Location: [facility] ENDOSCOPY;  Service: Cardiovascular;  Laterality: N/A;  . CERVICAL DISCECTOMY     C5,6,7 disc fused  with plate and 5 1" screws  . CHOLECYSTECTOMY    . colon polyp removal  3/1.19  . FOOT SURGERY     right calcification removed from top of foot  . knee cartiledge Right    right knee  . MOUTH SURGERY     periodontal surgery, bridges, splint in front,     Social History   Socioeconomic History  . Marital status: Married    Spouse name: Not on file  . Number of children: 0  . Years of education: Not  on file  . Highest education level: Not on file  Occupational History  . Occupation: retired    Fish farm manager: RETIRED  Tobacco Use  . Smoking status: Former Smoker    Packs/day: 0.50    Years: 50.00    Pack years: 25.00    Types: Cigarettes    Quit date: 05/01/2013    Years since quitting: 7.6  . Smokeless tobacco: Former Network engineer  . Vaping Use: Never used  Substance and Sexual Activity  . Alcohol use: No  . Drug use: No  . Sexual activity: Yes    Comment: lives with wife, no dietary restrictions  Other Topics Concern  . Not on file  Social History Narrative  . Not on file   Social Determinants of Health   Financial Resource Strain: Not on file  Food Insecurity: Not on file  Transportation Needs: Not on file  Physical Activity: Not on file  Stress: Not on file  Social Connections: Not on file  Intimate Partner Violence: Not on file    Allergies  Allergen Reactions  . Albumin (Human) Anaphylaxis  . Polymyxin B-Trimethoprim Swelling    Eye drops made eyes swell  . Pseudoephedrine Other (See Comments)    Stomach cramps  . Codeine Hives, Itching and Rash  . Guaiacol Other (See Comments)    Hallucinations  . Statins Other (See Comments)    Muscle cramps   . Meloxicam Other (See Comments)    Unknown reaction  . Pseudoephedrine-Guaifenesin Nausea And Vomiting    Stomach cramps  . Rosuvastatin Calcium Other (See Comments)    Muscle aches  . Tapentadol Other (See Comments)    Unknown reaction  . Ciprofloxacin Hives, Itching, Nausea Only and Rash  . Moxifloxacin Nausea Only and Other (See Comments)    Headaches, stomach cramps   . Rofecoxib Other (See Comments)    Stomach cramping    Family History  Problem Relation Age of Onset  . Heart disease Mother   . Diabetes Mother   . Cirrhosis Mother   . Emphysema Mother        never smoked but 2nd hand through her spouse  . Hypertension Mother   . Macular degeneration Mother   . Heart disease Father   .  Cancer Father        prostate  . Hyperlipidemia Father   . Hypertension Father   . Varicose Veins Father   . Heart attack Father   . Peripheral vascular disease Father   . Heart disease Sister   . Arthritis Sister   . Hyperlipidemia Sister   . Obesity Sister   . Macular degeneration Sister   . Heart disease Brother  5 stents  . Hyperlipidemia Brother   . Macular degeneration Maternal Grandfather   . Cirrhosis Sister   . Obesity Sister   . Arthritis Sister   . Heart disease Sister   . Obesity Sister   . Liver disease Other   . Prostate cancer Other   . Coronary artery disease Other     Prior to Admission medications   Medication Sig Start Date End Date Taking? Authorizing Provider  brimonidine (ALPHAGAN) 0.2 % ophthalmic solution Place 1 drop into the left eye in the morning and at bedtime.  09/10/17   [provider]  Cholecalciferol (VITAMIN D3) 5000 units CAPS Take 10,000 Units by mouth every other day.     [provider]  cholestyramine (QUESTRAN) 4 g packet Take 1 packet (4 g total) by mouth 2 (two) times daily. 12/02/20 03/02/21  [name], MD  clobetasol (TEMOVATE) 0.05 % external solution APPLY 1 APPLICATION TOPICALLY TWICE DAILY Patient taking differently: Apply 1 application topically 2 (two) times daily as needed (irritation). APPLY 1 APPLICATION TOPICALLY TWICE DAILY AS NEEDED 11/20/16   [name], MD  ELIQUIS 5 MG TABS tablet TAKE 1 TABLET BY MOUTH  TWICE DAILY Patient taking differently: Take 5 mg by mouth 2 (two) times daily. 06/24/20   Evans Lance, MD  furosemide (LASIX) 20 MG tablet Take 1 tablet (20 mg total) by mouth daily. 09/16/20   [name], MD  latanoprost (XALATAN) 0.005 % ophthalmic solution Place 1 drop into the left eye at bedtime.     [provider]  levalbuterol Penne Lash HFA) 45 MCG/ACT inhaler Inhale 2 puffs into the lungs every 4 (four) hours as needed for wheezing. 06/05/20 06/05/21  Saguier, Percell Miller,  PA-C  loperamide (IMODIUM) 2 MG capsule Take 1 capsule (2 mg total) by mouth every 6 (six) hours as needed for diarrhea or loose stools. 12/13/20   [name], MD  Magnesium Gluconate 500 (27 Mg) MG TABS Take 1-2 tablets (500-1,000 mg total) by mouth daily. 12/12/20   [name], MD  metoprolol tartrate (LOPRESSOR) 25 MG tablet TAKE 1 TABLET BY MOUTH  TWICE DAILY Patient taking differently: Take 25 mg by mouth 2 (two) times daily. 06/24/20   Evans Lance, MD  Multiple Vitamins-Minerals (ICAPS MV) TABS Take 1 tablet by mouth 2 (two) times daily.    [provider]  nitroGLYCERIN (NITROSTAT) 0.4 MG SL tablet Place 1 tablet (0.4 mg total) under the tongue every 5 (five) minutes as needed for chest pain. 04/14/20   Allie Bossier, MD  omeprazole (PRILOSEC OTC) 20 MG tablet Take 20 mg by mouth daily.    [provider]  potassium chloride (KLOR-CON) 10 MEQ tablet Take 1 tablet (10 mEq total) by mouth daily. 09/17/20   Evans Lance, MD  propafenone (RYTHMOL) 225 MG tablet TAKE 1 TABLET BY MOUTH  TWICE DAILY 11/12/20   Evans Lance, MD  Ranibizumab (LUCENTIS IO) Inject 1 Dose into the eye as needed (Macular degeneration).     [provider]  tamsulosin (FLOMAX) 0.4 MG CAPS capsule Take 0.4 mg by mouth every evening.    [provider]    Physical Exam: Vitals:   12/14/20 1045 12/14/20 1145 12/14/20 1215 12/14/20 1233  BP: (!) 126/51 111/87 129/65 137/66  Pulse: (!) 57 (!) 57 (!) 56 (!) 55  Resp: 14 13 15 20   Temp:    97.7 F (36.5 C)  TempSrc:    Oral  SpO2: 93% 97% 98% 96%  Weight:      Height:         . General:  Appears calm and comfortable and is in NAD; very conversant . Eyes:  EOMI, normal lids, iris . ENT:  grossly normal hearing, lips & tongue, mmm . Neck:  no LAD, masses or thyromegaly . Cardiovascular:  RRR, no m/r/g. No LE edema.  Marland Kitchen Respiratory:   CTA bilaterally with no wheezes/rales/rhonchi.  Normal respiratory  effort. . Abdomen:  soft, mildly diffusely TTP, ND, NABS . Skin:  no rash or induration seen on limited exam . Musculoskeletal:  grossly normal tone BUE/BLE, good ROM, no bony abnormality . Psychiatric:  grossly normal mood and affect, speech fluent and appropriate, AOx3 . Neurologic:  CN 2-12 grossly intact, moves all extremities in coordinated fashion    Radiological Exams on Admission: Independently reviewed - see discussion in A/P where applicable  No results found.  EKG: Independently reviewed.  NSR with rate 53; nonspecific ST changes with no evidence of acute ischemia   Labs on Admission: I have personally reviewed the available labs and imaging studies at the time of the admission.  Pertinent labs:   Calcium 8.0 Mag++ 0.8; 0.8 on 3/10 and 0.9 on 3/11 WBC 9.4 Hgb 12.7  Assessment/Plan Principal Problem:   Hypomagnesemia Active Problems:   Macular degeneration (senile) of retina   Essential hypertension   COPD GOLD I   BPH (benign prostatic hyperplasia)   PAF (paroxysmal atrial fibrillation) (HCC)   Diarrhea   Hypomagnesemia -Likely related to GI losses -While urinary loss is also a consideration, it seems less likely given his ongoing h/o diarrhea -Additional contributing factors may include Lasix, Prilosec - will hold both -He was given 2g IV Mag oxide and 800 mg PO Mag oxide in the ER -Will give an additional 4mg  IV Mag bolus -Hold home Mag gluconate since PO Mag supplements are more likely associated with diarrhea -Will recheck Mag++ at 1600 and 0500 and replete accordingly -Assuming Mag++ corrects, will attempt to d/c to home tomorrow or on 3/14 so that he can make his early AM GI appointment on 3/15 -Suggest mag glycinate as the best PO option at the time of d/c - controlled release, if possible (thanks to pharmacist extraordinaire Alfonse Spruce for this info!).  Chronic diarrhea -Reports Questran isn't working to slow stools - but when he took that 4 times  daily in the past he was concerned about constipation according to notes (patient does not recall this) -Has GI appt scheduled for 3/15 -Planned for upcoming colonoscopy with biopsies to further evaluate -Will continue loperamide for now  HTN -Continue Lopressor  COPD -Continue Xopenex  Chronic diastolic CHF -Appears euvolemic -Hold Lasix in case this is contributing to hypomagnesemia -Will also hold Potter for now  BPH -Continue Flomax  Afib -Rate controlled with propafenone, Lopressor -Continue Eliquis  Glaucoma/Macular degeneration -Followed by Dr. Cordelia Pen -Continue Alphagan, Xalatan    Note: This patient has been tested and is pending for the novel coronavirus COVID-19. The patient has been fully vaccinated against COVID-19.   Level of care: Med-SurgMed Surg DVT prophylaxis: Eliquis Code Status:  Full - confirmed with patient/family Family Communication: Wife was present throughout evaluation. Disposition Plan:  The patient is from: home  Anticipated d/c is to: home without [facility] services  Anticipated d/c date will depend on clinical response to treatment, but possibly as early as tomorrow if he has excellent response to treatment  Patient is currently: acutely  ill Consults called: Nutrition  Admission status:  It is my clinical opinion that referral for OBSERVATION is reasonable and necessary in this patient based on the above information provided. The aforementioned taken together are felt to place the patient at high risk for further clinical deterioration. However it is anticipated that the patient may be medically stable for discharge from the hospital within 24 to 48 hours.    [name] MD [facility]   How to contact the [facility] Attending or Consulting provider [place] or covering provider during after hours [place], for this patient?  1. Check the care team in [facility] and look for a) attending/consulting [facility] provider listed and b) the [facility] team  listed 2. Log into [email or web address] and use [facility]'s universal password to access. If you do not have the password, please contact the hospital operator. 3. Locate the [facility] provider you are looking for under [facility] and page to a number that you can be directly reached. 4. If you still have difficulty reaching the provider, please page the [facility] (Director on Call) for the Hospitalists listed on amion for assistance.   12/14/2020, 12:37 PM

## 2020-12-14 NOTE — ED Triage Notes (Signed)
Patient states that he has increased weakness for the past week when standing. Patient states that his happened in July and went to the doctor and it came back that Magnesium was low.  Patient states that he had blood work drawn a few days ago and Magnesium results came back low.  Also reports intermittent diarrhea since July of last year.

## 2020-12-14 NOTE — ED Provider Notes (Signed)
[facility] EMERGENCY DEPARTMENT Provider Note   CSN: [id number] Arrival date & time: 12/14/20  1478     History Chief Complaint  Patient presents with  . Weakness    Ryan [name] is a 75 y.o. male.  Patient with history of chronic diarrhea since 04/2020, 2 previous admissions for hypomagnesemia and hypokalemia, currently on loperamide and cholestyramine for diarrhea --presents the emergency department for low magnesium and generalized weakness.  Patient states that earlier this week he began to feel more weak and this prompted PCP follow-up.  He had magnesium of 0.8 and then a recheck the next after supplementation of 0.9.  Patient was encouraged to come to the hospital for repletion and further evaluation.  Initially he did not, however he presents this morning due to still feeling poorly.  Patient denies associated vomiting.  No current chest pain or shortness of breath.  He does have a history of COPD and uses an inhaler for this.  Patient is chronically anticoagulated on Eliquis. GI appointment scheduled on 12/17/20, appears plan for colonoscopy.  No history of kidney dysfunction. Drinks 1 beer or glass of wine daily. Takes PPI. No diuretics.         Past Medical History:  Diagnosis Date  . A-fib ([place]) 03/22/2018  . Anemia   . Atrial flutter ([place]) 03/31/2012   converted spontaneously to sinus  . CAD (coronary artery disease)    reportedly moderate CAD; managed medically  . CHF (congestive heart failure) ([place])   . COPD (chronic obstructive pulmonary disease) ([place])   . Eczema 08/03/2014  . Edema 03/22/2018  . GERD (gastroesophageal reflux disease)   . H/O: rheumatic fever   . Heart murmur   . Heart murmur   . High risk medication use    on amiodarone since 03/31/2012  . History of colonoscopy   . Hyperglycemia 06/21/2017  . Hyperlipidemia   . Hypertension   . Kidney stone 06/21/2017  . Kidney stones   . Lobar pneumonia ([place]) 03/22/2018  . Low vitamin D  level 06/21/2017  . Macular degeneration   . Osteoporosis 05/31/2016    Patient Active Problem List   Diagnosis Date Noted  . Hx of colonic polyps 11/24/2020  . Diarrhea 11/24/2020  . Dyspnea 04/20/2020  . Anxiety 04/20/2020  . Gastritis 04/10/2020  . 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 Elevated troponin 04/09/2020  . Nausea, vomiting, and diarrhea 04/09/2020  . Hypomagnesemia 04/09/2020  . Hypokalemia 04/09/2020  . Acute urinary retention 04/09/2020  . Statin intolerance 12/19/2019  . Vitamin D deficiency 09/18/2019  . CHF (congestive heart failure) ([place]) 03/22/2018  . A-fib ([place]) 03/22/2018  . Edema 03/22/2018  . H/O: rheumatic fever 12/27/2017  . Hyperglycemia 06/21/2017  . Kidney stone 06/21/2017  . 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 Osteoporosis 05/31/2016  . BPH (benign prostatic hyperplasia) 01/20/2015  . Erectile dysfunction 01/20/2015  . Rectal bleeding 01/20/2015  . Preventative health care 01/20/2015  . Eczema 08/03/2014  . Sleep apnea 03/01/2014  . Anticoagulation adequate with anticoagulant therapy 03/01/2014  . Pre-syncope 02/26/2014  . Abdominal pain 02/26/2014  . PAD (peripheral artery disease) ([place]) 02/26/2014  . Constipation 03/06/2013  . Easy bruising 03/06/2013  . History of alcohol abuse 04/15/2012  . CAD (coronary atherosclerotic disease) 05/08/2011  . Atypical chest pain 03/10/2011  . 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 UNS ADVRS EFF UNS RX MEDICINAL&BIOLOGICAL SBSTNC 11/11/2009  .  H/O tobacco use, presenting hazards to health 07/29/2009  . COPD GOLD I 07/29/2009  . Hyperlipidemia, mixed 06/20/2007  . Macular degeneration (senile) of retina 06/20/2007  . Essential hypertension 06/20/2007  . GERD 06/16/2007    Past Surgical History:  Procedure Laterality Date  . APPENDECTOMY    . CARDIAC CATHETERIZATION  04/22/2011   moderate left main and  RCA stenosis not significant by FFR and IVUS on medical therapy  . CARDIOVERSION N/A 04/25/2018   Procedure: CARDIOVERSION;  Surgeon: Skeet Latch, MD;  Location: [facility] ENDOSCOPY;  Service: Cardiovascular;  Laterality: N/A;  . CERVICAL DISCECTOMY     C5,6,7 disc fused  with plate and 5 1" screws  . CHOLECYSTECTOMY    . colon polyp removal  3/1.19  . FOOT SURGERY     right calcification removed from top of foot  . knee cartiledge Right    right knee  . MOUTH SURGERY     periodontal surgery, bridges, splint in front,        Family History  Problem Relation Age of Onset  . Heart disease Mother   . Diabetes Mother   . Cirrhosis Mother   . Emphysema Mother        never smoked but 2nd hand through her spouse  . Hypertension Mother   . Macular degeneration Mother   . Heart disease Father   . Cancer Father        prostate  . Hyperlipidemia Father   . Hypertension Father   . Varicose Veins Father   . Heart attack Father   . Peripheral vascular disease Father   . Heart disease Sister   . Arthritis Sister   . Hyperlipidemia Sister   . Obesity Sister   . Macular degeneration Sister   . Heart disease Brother        5 stents  . Hyperlipidemia Brother   . Macular degeneration Maternal Grandfather   . Cirrhosis Sister   . Obesity Sister   . Arthritis Sister   . Heart disease Sister   . Obesity Sister   . Liver disease Other   . Prostate cancer Other   . Coronary artery disease Other     Social History   Tobacco Use  . Smoking status: Former Smoker    Packs/day: 0.50    Years: 50.00    Pack years: 25.00    Types: Cigarettes    Quit date: 05/01/2013    Years since quitting: 7.6  . Smokeless tobacco: Former Systems developer  . Tobacco comment: using e-[employer]ig//ldc  Vaping Use  . Vaping Use: Never used  Substance Use Topics  . Alcohol use: No  . Drug use: No    Home Medications Prior to Admission medications   Medication Sig Start Date End Date Taking? Authorizing Provider   brimonidine (ALPHAGAN) 0.2 % ophthalmic solution Place 1 drop into the left eye in the morning and at bedtime.  09/10/17   [provider]  Cholecalciferol (VITAMIN D3) 5000 units CAPS Take 10,000 Units by mouth every other day.     [provider]  cholestyramine (QUESTRAN) 4 g packet Take 1 packet (4 g total) by mouth 2 (two) times daily. 12/02/20 03/02/21  [name], MD  clobetasol (TEMOVATE) 0.05 % external solution APPLY 1 APPLICATION TOPICALLY TWICE DAILY Patient taking differently: Apply 1 application topically 2 (two) times daily as needed (irritation). APPLY 1 APPLICATION TOPICALLY TWICE DAILY AS NEEDED 11/20/16   [name], MD  ELIQUIS 5 MG TABS  tablet TAKE 1 TABLET BY MOUTH  TWICE DAILY Patient taking differently: Take 5 mg by mouth 2 (two) times daily. 06/24/20   Evans Lance, MD  furosemide (LASIX) 20 MG tablet Take 1 tablet (20 mg total) by mouth daily. 09/16/20   [name], MD  latanoprost (XALATAN) 0.005 % ophthalmic solution Place 1 drop into the left eye at bedtime.     [provider]  levalbuterol Penne Lash HFA) 45 MCG/ACT inhaler Inhale 2 puffs into the lungs every 4 (four) hours as needed for wheezing. 06/05/20 06/05/21  Saguier, Percell Miller, PA-C  loperamide (IMODIUM) 2 MG capsule Take 1 capsule (2 mg total) by mouth every 6 (six) hours as needed for diarrhea or loose stools. 12/13/20   [name], MD  Magnesium Gluconate 500 (27 Mg) MG TABS Take 1-2 tablets (500-1,000 mg total) by mouth daily. 12/12/20   [name], MD  metoprolol tartrate (LOPRESSOR) 25 MG tablet TAKE 1 TABLET BY MOUTH  TWICE DAILY Patient taking differently: Take 25 mg by mouth 2 (two) times daily. 06/24/20   Evans Lance, MD  Multiple Vitamins-Minerals (ICAPS MV) TABS Take 1 tablet by mouth 2 (two) times daily.    [provider]  nitroGLYCERIN (NITROSTAT) 0.4 MG SL tablet Place 1 tablet (0.4 mg total) under the tongue every 5 (five) minutes as needed for  chest pain. 04/14/20   Allie Bossier, MD  omeprazole (PRILOSEC OTC) 20 MG tablet Take 20 mg by mouth daily.    [provider]  potassium chloride (KLOR-CON) 10 MEQ tablet Take 1 tablet (10 mEq total) by mouth daily. 09/17/20   Evans Lance, MD  propafenone (RYTHMOL) 225 MG tablet TAKE 1 TABLET BY MOUTH  TWICE DAILY 11/12/20   Evans Lance, MD  Ranibizumab (LUCENTIS IO) Inject 1 Dose into the eye as needed (Macular degeneration).     [provider]  tamsulosin (FLOMAX) 0.4 MG CAPS capsule Take 0.4 mg by mouth every evening.    [provider]    Allergies    Albumin (human), Polymyxin b-trimethoprim, Pseudoephedrine, Codeine, Guaiacol, Statins, Meloxicam, Pseudoephedrine-guaifenesin, Rosuvastatin calcium, Tapentadol, Ciprofloxacin, Moxifloxacin, and Rofecoxib  Review of Systems   Review of Systems  Constitutional: Positive for fatigue. Negative for fever.  HENT: Negative for rhinorrhea and sore throat.   Eyes: Negative for redness.  Respiratory: Negative for cough.   Cardiovascular: Negative for chest pain.  Gastrointestinal: Positive for diarrhea. Negative for abdominal pain, nausea and vomiting.  Genitourinary: Negative for dysuria and hematuria.  Musculoskeletal: Negative for myalgias.  Skin: Negative for rash.  Neurological: Positive for weakness. Negative for headaches.    Physical Exam Updated Vital Signs BP (!) 168/66 (BP Location: Left Arm)   Pulse (!) 52   Temp 98.5 F (36.9 C) (Oral)   Resp 15   Ht 5\' 4"  (1.626 m)   Wt 76.2 kg   SpO2 100%   BMI 28.84 kg/m   Physical Exam Vitals and nursing note reviewed.  Constitutional:      Appearance: He is well-developed.  HENT:     Head: Normocephalic and atraumatic.  Eyes:     General:        Right eye: No discharge.        Left eye: No discharge.     Conjunctiva/sclera: Conjunctivae normal.  Cardiovascular:     Rate and Rhythm: Normal rate and regular rhythm.     Heart sounds: Normal  heart sounds.  Pulmonary:  Effort: Pulmonary effort is normal.     Breath sounds: Normal breath sounds.  Abdominal:     Palpations: Abdomen is soft.     Tenderness: There is no abdominal tenderness.  Musculoskeletal:     Cervical back: Normal range of motion and neck supple.     Right lower leg: No edema.     Left lower leg: No edema.  Skin:    General: Skin is warm and dry.  Neurological:     Mental Status: He is alert.     ED Results / Procedures / Treatments   Labs (all labs ordered are listed, but only abnormal results are displayed) Labs Reviewed  CBC WITH DIFFERENTIAL/PLATELET - Abnormal; Notable for the following components:      Result Value   RBC 4.21 (*)    Hemoglobin 12.7 (*)    HCT 38.2 (*)    All other components within normal limits  BASIC METABOLIC PANEL - Abnormal; Notable for the following components:   Calcium 8.0 (*)    All other components within normal limits  MAGNESIUM - Abnormal; Notable for the following components:   Magnesium 0.8 (*)    All other components within normal limits    ED ECG REPORT   Date: 12/14/2020  Rate: 53  Rhythm: sinus bradycardia  QRS Axis: normal  Intervals: PR prolonged  ST/T Wave abnormalities: nonspecific ST changes  Conduction Disutrbances:none  Narrative Interpretation:   Old EKG Reviewed: similar to 11/21, 8/21.   I have personally reviewed the EKG tracing and agree with the computerized printout as noted.  Radiology No results found.  Procedures Procedures   Medications Ordered in ED Medications  magnesium sulfate IVPB 2 g 50 mL (0 g Intravenous Stopped 12/14/20 1041)  magnesium oxide (MAG-OX) tablet 800 mg (800 mg Oral Given 12/14/20 0932)  sodium chloride 0.9 % bolus 1,000 mL (1,000 mLs Intravenous New Bag/Given 12/14/20 0935)    ED Course  I have reviewed the triage vital signs and the nursing notes.  Pertinent labs & imaging results that were available during my care of the patient were  reviewed by me and considered in my medical decision making (see chart for details).  Patient seen and examined. Work-up initiated. Medications ordered. Qtc 438.  Vital signs reviewed and are as follows: BP (!) 168/66 (BP Location: Left Arm)   Pulse (!) 52   Temp 98.5 F (36.9 C) (Oral)   Resp 15   Ht 5\' 4"  (1.626 m)   Wt 76.2 kg   SpO2 100%   BMI 28.84 kg/m   Magnesium here returned at 0.8.  Normal potassium.  11:21 AM discussed case with Dr. Lorin Mercy, [facility] hospitalist, who will see patient.  CRITICAL CARE Performed by: Carlisle Cater PA-C Total critical care time: 30 minutes Critical care time was exclusive of separately billable procedures and treating other patients. Critical care was necessary to treat or prevent imminent or life-threatening deterioration. Critical care was time spent personally by me on the following activities: development of treatment plan with patient and/or surrogate as well as nursing, discussions with consultants, evaluation of patient's response to treatment, examination of patient, obtaining history from patient or surrogate, ordering and performing treatments and interventions, ordering and review of laboratory studies, ordering and review of radiographic studies, pulse oximetry and re-evaluation of patient's condition.     MDM Rules/Calculators/A&P  Admit for Mg repletion, 0.8 without EKG changes.     Final Clinical Impression(s) / ED Diagnoses Final diagnoses:  Hypomagnesemia  Chronic diarrhea    Rx / DC Orders ED Discharge Orders    None       Carlisle Cater, PA-C 12/14/20 1121    Charlesetta Shanks, MD 12/14/20 1621

## 2020-12-14 NOTE — ED Notes (Signed)
Attempted report 

## 2020-12-14 NOTE — ED Notes (Signed)
Josh, [place] notified of critical mag 0.8.

## 2020-12-15 LAB — BASIC METABOLIC PANEL
Anion gap: 5 (ref 5–15)
BUN: 9 mg/dL (ref 8–23)
CO2: 24 mmol/L (ref 22–32)
Calcium: 7.9 mg/dL — ABNORMAL LOW (ref 8.9–10.3)
Chloride: 109 mmol/L (ref 98–111)
Creatinine, Ser: 1.03 mg/dL (ref 0.61–1.24)
GFR, Estimated: >60 mL/min (ref >60)
Glucose, Bld: 93 mg/dL (ref 70–99)
Potassium: 3.7 mmol/L (ref 3.5–5.1)
Sodium: 138 mmol/L (ref 135–145)

## 2020-12-15 LAB — CBC
HCT: 32.8 % — ABNORMAL LOW (ref 39.0–52.0)
Hemoglobin: 11.4 g/dL — ABNORMAL LOW (ref 13.0–17.0)
MCH: 30.9 pg (ref 26.0–34.0)
MCHC: 34.8 g/dL (ref 30.0–36.0)
MCV: 88.9 fL (ref 80.0–100.0)
Platelets: 182 10*3/uL (ref 150–400)
RBC: 3.69 MIL/uL — ABNORMAL LOW (ref 4.22–5.81)
RDW: 13.8 % (ref 11.5–15.5)
WBC: 8.7 10*3/uL (ref 4.0–10.5)
nRBC: 0 % (ref 0.0–0.2)

## 2020-12-15 LAB — CALCIUM, IONIZED: Calcium, Ionized, Serum: 4.4 mg/dL — ABNORMAL LOW (ref 4.5–5.6)

## 2020-12-15 LAB — MAGNESIUM: Magnesium: 2.2 mg/dL (ref 1.7–2.4)

## 2020-12-15 MED ORDER — MAGNESIUM GLYCINATE 665 MG PO CAPS
1.0000 | ORAL_CAPSULE | Freq: Every day | ORAL | 0 refills | Status: DC
Start: 2020-12-15 — End: 2020-12-17

## 2020-12-15 NOTE — Discharge Summary (Signed)
Physician Discharge Summary  Ryan [name] YFV:[id number] DOB: 12/24/1945 DOA: 12/14/2020  PCP: [name], MD  Admit date: 12/14/2020   Discharge date: 12/15/2020  Admitted From: Home Disposition: Home  Recommendations for Outpatient Follow-up:  1. Follow up with PCP in 1-2 weeks. 2. Please obtain BMP/CBC in one week. 3. Advised to follow-up with GI as scheduled.   4. Advised to take magnesium glycinate 1 capsule daily.   Home Health: None. Equipment/Devices: None.  Discharge Condition: Stable CODE STATUS:Full code Diet recommendation: Heart Healthy   Brief Summary: Ryan [name] is a 75 y.o. male with medical history significant of HTN; HLD; CAD; COPD; chronic diastolic CHF; BPH; and afib on Eliquis presented in the ED with generalised weakness.  He was previously admitted for the same from 11/22- 11/25; He also had hypokalemia at that time.  This was attributed to chronic diarrhea and he was scheduled for outpatient GI f/u with Dr. Earlean Shawl.  At his last visit on 12/7, ddx of microscopic colitis, lymphocytic colitis, and SCAD was made and he was planned for colonoscopy.   He was started on Questran with some improvement.  Colonoscopy was not done and he is scheduled for GI f/u with Dr. Bryan Lemma on 3/15.  The patient reports that in July 2021 he developed diarrhea after he ate something.  He had severe diarrhea to the point where he passed out and ended up being hospitalized for 6 days. His electrolytes were all off.  His diarrhea has never resolved.  In November, it happened again and he was unable to control his diarrhea.  He was hospitalized again and they decided to try to figure out what was causing it.  He was seeing Dr. Earlean Shawl and he saw the PA and started running a lot of tests.  He did have polypectomies in 2019 and 2020.  They were considering another colonoscopy but he didn't hear anything further.  He was referred to see Dr. Bryan Lemma and has an appointment to see him on  3/15.  He has been increasingly lethargic.  Dr. Randel Pigg called yesterday and reported very low Mag++ and she encouraged him to come to the ER. He felt fine then - but when he woke up overnight he was lightheaded and dizzy with lots of diarrhea and so decided to come.  He has 2-3 stools per day, always loose.  He describes them as "cow patties" but not liquid.  He is taking the cholestyramine twice a day and does not remember taking it 4 times daily and it causing him to be constipated.  Hospital course: This patient was sent from PCPs office with low magnesium, level 0.8 secondary to chronic diarrhea.  Patient was admitted, and was given magnesium replacement.  His electrolytes improved significantly.  Patient feels much better, states diarrhea has improved.  Patient has ambulated in the hallway without any difficulty breathing.  Patient has appointment with Dr. Cathleen Corti on 3/15 in the morning.  Patient wants to be discharged and patient is being discharged on magnesium glycinate 1 capsule daily.  Patient is being discharged home. Discharge electrolytes normal, Magnesium 2.2  He was managed for below problems.   Discharge Diagnoses:  Principal Problem:   Hypomagnesemia Active Problems:   Macular degeneration (senile) of retina   Essential hypertension   COPD GOLD I   BPH (benign prostatic hyperplasia)   PAF (paroxysmal atrial fibrillation) (HCC)   Diarrhea  Hypomagnesemia > resolved -Likely related to GI losses -While urinary loss is also a  consideration, it seems less likely given his ongoing h/o diarrhea -Additional contributing factors may include Lasix, Prilosec - will hold both -He was given 2g IV Mag oxide and 800 mg PO Mag oxide in the ER -Will give an additional 4mg  IV Mag bolus -Hold home Mag gluconate since PO Mag supplements are more likely associated with diarrhea -Will recheck Mag++ at 1600 and 0500 and replete accordingly -Assuming Mag++ corrects, will attempt to d/c to home  tomorrow or on 3/14 so that he can make his early AM GI appointment on 3/15 -Suggest mag glycinate as the best PO option at the time of d/c - controlled release, if possible (thanks to pharmacist extraordinaire Alfonse Spruce for this info!).  Chronic diarrhea > Improving -Reports Questran isn't working to slow stools - but when he took that 4 times daily in the past he was concerned about constipation according to notes (patient does not recall this) -Has GI appt scheduled for 3/15 -Planned for upcoming colonoscopy with biopsies to further evaluate -Will continue loperamide for now  HTN -Continue Lopressor  COPD -Continue Xopenex  Chronic diastolic CHF -Appears euvolemic. -Hold Lasix in case this is contributing to hypomagnesemia -Will also hold KDur for now  BPH -Continue Flomax  Afib -Rate controlled with propafenone, Lopressor -Continue Eliquis  Glaucoma/Macular degeneration -Followed by Dr. Cordelia Pen -Continue Alphagan, Xalatan   Discharge Instructions  Discharge Instructions    Call MD for:  difficulty breathing, headache or visual disturbances   Complete by: As directed    Call MD for:  persistant dizziness or light-headedness   Complete by: As directed    Call MD for:  persistant nausea and vomiting   Complete by: As directed    Diet - low sodium heart healthy   Complete by: As directed    Diet Carb Modified   Complete by: As directed    Discharge instructions   Complete by: As directed    Advised to follow-up with primary care physician in 1 week. Advised to follow-up with GI as scheduled.   Advised to take magnesium glycinate 1 capsule daily.   Increase activity slowly   Complete by: As directed      Allergies as of 12/15/2020      Reactions   Albumin (human) Anaphylaxis   Polymyxin B-trimethoprim Swelling   Eye drops made eyes swell   Pseudoephedrine Other (See Comments)   Stomach cramps   Codeine Hives, Itching, Rash   Guaiacol Other (See  Comments)   Hallucinations   Statins Other (See Comments)   Muscle cramps   Meloxicam Other (See Comments)   Unknown reaction   Pseudoephedrine-guaifenesin Nausea And Vomiting   Stomach cramps   Rosuvastatin Calcium Other (See Comments)   Muscle aches   Tapentadol Other (See Comments)   Unknown reaction   Ciprofloxacin Hives, Itching, Nausea Only, Rash   Moxifloxacin Nausea Only, Other (See Comments)   Headaches, stomach cramps   Rofecoxib Other (See Comments)   Stomach cramping      Medication List    STOP taking these medications   Magnesium Gluconate 500 (27 Mg) MG Tabs     TAKE these medications   brimonidine 0.2 % ophthalmic solution Commonly known as: ALPHAGAN Place 1 drop into the left eye in the morning and at bedtime.   cholestyramine 4 g packet Commonly known as: QUESTRAN Take 1 packet (4 g total) by mouth 2 (two) times daily.   clobetasol 0.05 % external solution Commonly known as: TEMOVATE APPLY 1 APPLICATION  TOPICALLY TWICE DAILY What changed:   how much to take  how to take this  when to take this  reasons to take this  additional instructions   Eliquis 5 MG Tabs tablet Generic drug: apixaban TAKE 1 TABLET BY MOUTH  TWICE DAILY What changed: how much to take   furosemide 20 MG tablet Commonly known as: LASIX Take 1 tablet (20 mg total) by mouth daily.   ICaps MV Tabs Take 1 tablet by mouth 2 (two) times daily.   latanoprost 0.005 % ophthalmic solution Commonly known as: XALATAN Place 1 drop into the left eye at bedtime.   levalbuterol 45 MCG/ACT inhaler Commonly known as: XOPENEX HFA Inhale 2 puffs into the lungs every 4 (four) hours as needed for wheezing.   loperamide 2 MG capsule Commonly known as: IMODIUM Take 1 capsule (2 mg total) by mouth every 6 (six) hours as needed for diarrhea or loose stools.   LUCENTIS IO Inject 1 Dose into the eye as needed (Macular degeneration).   Magnesium Glycinate 665 MG Caps Take 1 tablet  by mouth daily.   metoprolol tartrate 25 MG tablet Commonly known as: LOPRESSOR TAKE 1 TABLET BY MOUTH  TWICE DAILY   nitroGLYCERIN 0.4 MG SL tablet Commonly known as: NITROSTAT Place 1 tablet (0.4 mg total) under the tongue every 5 (five) minutes as needed for chest pain.   omeprazole 20 MG tablet Commonly known as: PRILOSEC OTC Take 20 mg by mouth daily.   potassium chloride 10 MEQ tablet Commonly known as: KLOR-CON Take 1 tablet (10 mEq total) by mouth daily.   propafenone 225 MG tablet Commonly known as: RYTHMOL TAKE 1 TABLET BY MOUTH  TWICE DAILY   tamsulosin 0.4 MG Caps capsule Commonly known as: FLOMAX Take 0.4 mg by mouth every evening.   Vitamin D3 125 MCG (5000 UT) Caps Take 10,000 Units by mouth every other day.       Follow-up Information    [name], MD Follow up in 1 week(s).   Specialty: Family Medicine Contact information: [address] [place] [address] [phone]        Evans Lance, MD .   Specialty: Cardiology Contact information: [phone] [address] [phone]        Gerrit Heck V, DO Follow up in 2 day(s).   Specialty: Gastroenterology Contact information: [address] [place] [address] [phone]              Allergies  Allergen Reactions  . Albumin (Human) Anaphylaxis  . Polymyxin B-Trimethoprim Swelling    Eye drops made eyes swell  . Pseudoephedrine Other (See Comments)    Stomach cramps  . Codeine Hives, Itching and Rash  . Guaiacol Other (See Comments)    Hallucinations  . Statins Other (See Comments)    Muscle cramps   . Meloxicam Other (See Comments)    Unknown reaction  . Pseudoephedrine-Guaifenesin Nausea And Vomiting    Stomach cramps  . Rosuvastatin Calcium Other (See Comments)    Muscle aches  . Tapentadol Other (See Comments)    Unknown reaction  . Ciprofloxacin Hives, Itching, Nausea Only and Rash  .  Moxifloxacin Nausea Only and Other (See Comments)    Headaches, stomach cramps   . Rofecoxib Other (See Comments)    Stomach cramping    Consultations:  None   Procedures/Studies:  No results found. None   Subjective: Patient  was seen and examined at bedside.  Overnight events noted.  Patient reports diarrhea is improving.  His magnesium has improved.   Patient wants to be discharged,  Patient has ambulated in the hallway without any symptoms.  Discharge Exam: Vitals:   12/14/20 1959 12/15/20 0425  BP: 140/72 (!) 130/52  Pulse: 60 (!) 52  Resp: 18 18  Temp: 97.9 F (36.6 C) 97.8 F (36.6 C)  SpO2: 100% 98%   Vitals:   12/14/20 1233 12/14/20 1322 12/14/20 1959 12/15/20 0425  BP: 137/66 (!) 156/105 140/72 (!) 130/52  Pulse: (!) 55 60 60 (!) 52  Resp: 20 18 18 18   Temp: 97.7 F (36.5 C) 98 F (36.7 C) 97.9 F (36.6 C) 97.8 F (36.6 C)  TempSrc: Oral Oral Oral Oral  SpO2: 96% 100% 100% 98%  Weight:  76.7 kg    Height:  5\' 4"  (1.626 m)      General: Pt is alert, awake, not in acute distress Cardiovascular: RRR, S1/S2 +, no rubs, no gallops Respiratory: CTA bilaterally, no wheezing, no rhonchi Abdominal: Soft, NT, ND, bowel sounds + Extremities: no edema, no cyanosis    The results of significant diagnostics from this hospitalization (including imaging, microbiology, ancillary and laboratory) are listed below for reference.     Microbiology: Recent Results (from the past 240 hour(s))  SARS CORONAVIRUS 2 (TAT 6-24 HRS) Nasopharyngeal Nasopharyngeal Swab     Status: None   Collection Time: 12/14/20  3:47 PM   Specimen: Nasopharyngeal Swab  Result Value Ref Range Status   SARS Coronavirus 2 NEGATIVE NEGATIVE Final    Comment: (NOTE) SARS-CoV-2 target nucleic acids are NOT DETECTED.  The SARS-CoV-2 RNA is generally detectable in upper and lower respiratory specimens during the acute phase of infection. Negative results do not preclude SARS-CoV-2  infection, do not rule out co-infections with other pathogens, and should not be used as the sole basis for treatment or other patient management decisions. Negative results must be combined with clinical observations, patient history, and epidemiological information. The expected result is Negative.  Fact Sheet for Patients: SugarRoll.be  Fact Sheet for Healthcare Providers: https://www.woods-mathews.com/  This test is not yet approved or cleared by the [place] FDA and  has been authorized for detection and/or diagnosis of SARS-CoV-2 by FDA under an Emergency Use Authorization (EUA). This EUA will remain  in effect (meaning this test can be used) for the duration of the COVID-19 declaration under Se ction 564(b)(1) of the Act, 21 U.S.C. section 360bbb-3(b)(1), unless the authorization is terminated or revoked sooner.  Performed at [facility], [place] [address], [place], [place] [address]      Labs: BNP (last 3 results) Recent Labs    05/02/20 2007  BNP 660.6*   Basic Metabolic Panel: Recent Labs  Lab 12/12/20 0810 12/13/20 1020 12/14/20 0909 12/14/20 1548 12/15/20 0353  NA  --   --  136  --  138  K  --   --  3.6  --  3.7  CL  --   --  105  --  109  CO2  --   --  22  --  24  GLUCOSE  --   --  93  --  93  BUN  --   --  10  --  9  CREATININE  --   --  1.17  --  1.03  CALCIUM  --   --  8.0*  --  7.9*  MG 0.8* 0.9*  0.8* 1.8 2.2   Liver Function Tests: No results for input(s): AST, ALT, ALKPHOS, BILITOT, PROT, ALBUMIN in the last 168 hours. No results for input(s): LIPASE, AMYLASE in the last 168 hours. No results for input(s): AMMONIA in the last 168 hours. CBC: Recent Labs  Lab 12/14/20 0909 12/15/20 0353  WBC 9.4 8.7  NEUTROABS 6.4  --   HGB 12.7* 11.4*  HCT 38.2* 32.8*  MCV 90.7 88.9  PLT 187 182   Cardiac Enzymes: No results for input(s): CKTOTAL, CKMB, CKMBINDEX, TROPONINI in the last 168  hours. BNP: Invalid input(s): POCBNP CBG: No results for input(s): GLUCAP in the last 168 hours. D-Dimer No results for input(s): DDIMER in the last 72 hours. Hgb A1c No results for input(s): HGBA1C in the last 72 hours. Lipid Profile No results for input(s): CHOL, HDL, LDLCALC, TRIG, CHOLHDL, LDLDIRECT in the last 72 hours. Thyroid function studies No results for input(s): TSH, T4TOTAL, T3FREE, THYROIDAB in the last 72 hours.  Invalid input(s): FREET3 Anemia work up No results for input(s): VITAMINB12, FOLATE, FERRITIN, TIBC, IRON, RETICCTPCT in the last 72 hours. Urinalysis    Component Value Date/Time   COLORURINE YELLOW 04/09/2020 1025   APPEARANCEUR CLEAR 04/09/2020 1025   LABSPEC 1.004 (L) 04/09/2020 1025   PHURINE 7.0 04/09/2020 1025   GLUCOSEU NEGATIVE 04/09/2020 1025   GLUCOSEU NEGATIVE 12/27/2017 1157   HGBUR LARGE (A) 04/09/2020 1025   HGBUR negative 03/15/2009 0804   BILIRUBINUR NEGATIVE 04/09/2020 1025   BILIRUBINUR n 12/01/2013 1136   KETONESUR 5 (A) 04/09/2020 1025   PROTEINUR NEGATIVE 04/09/2020 1025   UROBILINOGEN 0.2 12/27/2017 1157   NITRITE NEGATIVE 04/09/2020 1025   LEUKOCYTESUR SMALL (A) 04/09/2020 1025   Sepsis Labs Invalid input(s): PROCALCITONIN,  WBC,  LACTICIDVEN Microbiology Recent Results (from the past 240 hour(s))  SARS CORONAVIRUS 2 (TAT 6-24 HRS) Nasopharyngeal Nasopharyngeal Swab     Status: None   Collection Time: 12/14/20  3:47 PM   Specimen: Nasopharyngeal Swab  Result Value Ref Range Status   SARS Coronavirus 2 NEGATIVE NEGATIVE Final    Comment: (NOTE) SARS-CoV-2 target nucleic acids are NOT DETECTED.  The SARS-CoV-2 RNA is generally detectable in upper and lower respiratory specimens during the acute phase of infection. Negative results do not preclude SARS-CoV-2 infection, do not rule out co-infections with other pathogens, and should not be used as the sole basis for treatment or other patient management  decisions. Negative results must be combined with clinical observations, patient history, and epidemiological information. The expected result is Negative.  Fact Sheet for Patients: SugarRoll.be  Fact Sheet for Healthcare Providers: https://www.woods-mathews.com/  This test is not yet approved or cleared by the [place] FDA and  has been authorized for detection and/or diagnosis of SARS-CoV-2 by FDA under an Emergency Use Authorization (EUA). This EUA will remain  in effect (meaning this test can be used) for the duration of the COVID-19 declaration under Se ction 564(b)(1) of the Act, 21 U.S.C. section 360bbb-3(b)(1), unless the authorization is terminated or revoked sooner.  Performed at [facility], [place] [address], [place], [place] [address]      Time coordinating discharge: Over 30 minutes  SIGNED:   Shawna Clamp, MD  [facility] 12/15/2020, 10:33 AM Pager   If 7PM-7AM, please contact night-coverage [email or web address]

## 2020-12-15 NOTE — Progress Notes (Signed)
Gave discharge instructions to patient, removed IV, patient verbalized understanding

## 2020-12-15 NOTE — Discharge Instructions (Signed)
Advised to follow-up with primary care physician in 1 week. Advised to follow-up with GI as scheduled.   Advised to take magnesium glycinate 1 capsule daily.

## 2020-12-16 ENCOUNTER — Other Ambulatory Visit: Payer: Self-pay | Admitting: *Deleted

## 2020-12-16 ENCOUNTER — Other Ambulatory Visit (INDEPENDENT_AMBULATORY_CARE_PROVIDER_SITE_OTHER): Payer: Medicare Other

## 2020-12-16 ENCOUNTER — Telehealth: Payer: Self-pay

## 2020-12-16 ENCOUNTER — Telehealth: Payer: Self-pay | Admitting: *Deleted

## 2020-12-16 ENCOUNTER — Telehealth: Payer: Self-pay | Admitting: Family Medicine

## 2020-12-16 ENCOUNTER — Other Ambulatory Visit: Payer: Self-pay

## 2020-12-16 DIAGNOSIS — R790 Abnormal level of blood mineral: Secondary | ICD-10-CM

## 2020-12-16 DIAGNOSIS — E876 Hypokalemia: Secondary | ICD-10-CM | POA: Diagnosis not present

## 2020-12-16 LAB — COMPREHENSIVE METABOLIC PANEL
ALT: 9 U/L (ref 0–53)
AST: 13 U/L (ref 0–37)
Albumin: 3.8 g/dL (ref 3.5–5.2)
Alkaline Phosphatase: 65 U/L (ref 39–117)
BUN: 9 mg/dL (ref 6–23)
CO2: 30 mEq/L (ref 19–32)
Calcium: 9.3 mg/dL (ref 8.4–10.5)
Chloride: 106 mEq/L (ref 96–112)
Creatinine, Ser: 1.06 mg/dL (ref 0.40–1.50)
GFR: 69.42 mL/min (ref >60.00)
Glucose, Bld: 80 mg/dL (ref 70–99)
Potassium: 4.3 mEq/L (ref 3.5–5.1)
Sodium: 141 mEq/L (ref 135–145)
Total Bilirubin: 0.7 mg/dL (ref 0.2–1.2)
Total Protein: 6.5 g/dL (ref 6.0–8.3)

## 2020-12-16 NOTE — Telephone Encounter (Signed)
Patient notified by nurse to go to ER.  Patient went.

## 2020-12-16 NOTE — Telephone Encounter (Signed)
Ryan [name] mr ron is out of the hospital and is here for a magnesium check. it was normal there. do you want to recheck?   Dr. Charlett Blake yes then I can advise how much to stay on. redo a cmp as his potassium was low when he presented to ER thanks  Labs ordered.

## 2020-12-16 NOTE — Telephone Encounter (Signed)
Transition Care Management Unsuccessful Follow-up Telephone Call  Date of discharge and from where:  12/15/2020-[place]  Attempts:  1st Attempt  Reason for unsuccessful TCM follow-up call:  Left voice message

## 2020-12-16 NOTE — Telephone Encounter (Signed)
Pt came in office for labs and pt wanted to inform Roderic Ovens (RN)  that he did go to the ER this wkend, pt wanted provider to know and RN to know as well and to have them to take a look at his summary visit at the ER on pt's chart.

## 2020-12-17 ENCOUNTER — Encounter: Payer: Self-pay | Admitting: Family Medicine

## 2020-12-17 ENCOUNTER — Telehealth: Payer: Self-pay

## 2020-12-17 ENCOUNTER — Ambulatory Visit (INDEPENDENT_AMBULATORY_CARE_PROVIDER_SITE_OTHER): Payer: Medicare Other | Admitting: Gastroenterology

## 2020-12-17 ENCOUNTER — Encounter: Payer: Self-pay | Admitting: Gastroenterology

## 2020-12-17 ENCOUNTER — Other Ambulatory Visit: Payer: Self-pay | Admitting: Family Medicine

## 2020-12-17 VITALS — BP 124/60 | HR 54 | Ht 64.0 in | Wt 175.0 lb

## 2020-12-17 DIAGNOSIS — R140 Abdominal distension (gaseous): Secondary | ICD-10-CM | POA: Diagnosis not present

## 2020-12-17 DIAGNOSIS — K529 Noninfective gastroenteritis and colitis, unspecified: Secondary | ICD-10-CM

## 2020-12-17 DIAGNOSIS — K648 Other hemorrhoids: Secondary | ICD-10-CM

## 2020-12-17 DIAGNOSIS — R933 Abnormal findings on diagnostic imaging of other parts of digestive tract: Secondary | ICD-10-CM | POA: Diagnosis not present

## 2020-12-17 MED ORDER — MAGNESIUM OXIDE 400 MG PO TABS: 400.0000 mg | ORAL_TABLET | Freq: Two times a day (BID) | ORAL | 0 refills | Status: DC

## 2020-12-17 NOTE — Telephone Encounter (Signed)
Transition Care Management Follow-up Telephone Call  Date of discharge and from where: 12/15/2020-[name]  How have you been since you were released from the hospital? "Fine as a frog on a lilly pad"  Any questions or concerns? No  Items Reviewed:  Did the pt receive and understand the discharge instructions provided? Yes   Medications obtained and verified? Yes   Other? Yes   Any new allergies since your discharge? No   Dietary orders reviewed? Yes  Do you have support at home? Yes   Home Care and Equipment/Supplies: Were home health services ordered? no If so, what is the name of the agency? n/a  Has the agency set up a time to come to the patient's home? not applicable Were any new equipment or medical supplies ordered?  No What is the name of the medical supply agency? n/a Were you able to get the supplies/equipment? not applicable Do you have any questions related to the use of the equipment or supplies? n/a  Functional Questionnaire: (I = Independent and D = Dependent) ADLs: i  Bathing/Dressing- I  Meal Prep- I  Eating- I  Maintaining continence- I  Transferring/Ambulation- I  Managing Meds- I  Follow up appointments reviewed:   PCP Hospital f/u appt confirmed? No  Patient refused appt with PCP  Specialist Hospital f/u appt confirmed? Yes  Scheduled to see Dr. Bryan Lemma on 12/17/2020 @ 8:20.  Are transportation arrangements needed? No   If their condition worsens, is the pt aware to call PCP or go to the Emergency Dept.? Yes  Was the patient provided with contact information for the PCP's office or ED? Yes  Was to pt encouraged to call back with questions or concerns? Yes

## 2020-12-17 NOTE — Telephone Encounter (Signed)
He should stay on the magnesium twice a day I sent it to the pharmacy. We need to recheck a magnesium level next week. Please arrange

## 2020-12-17 NOTE — Progress Notes (Addendum)
Chief Complaint: Diarrhea   Referring Provider:     [name], MD   HPI:     Ryan [name] is a 75 y.o. male with a history of CAD, COPD, CHF, GERD, HTN, hyperlipidemia, nephrolithiasis, osteoporosis, atrial fibrillation, cardioversion 2019, cholecystectomy, referred to the [facility] for evaluation of diarrhea.  Was hospitalized in 04/2020 with continued watery, nonbloody diarrhea since then.  Has been following with Dr. Earlean Shawl, last seen on 09/10/2020, for the same, but transferring care to [facility] today.  Evaluation to date includes the following: -04/2020: Acute onset diarrhea with 6-day hospitalization for electrolyte abnormalities, fatigue -04/2020: CT A/P: Sigmoid diverticulosis without diverticulitis, gastric wall thickening -08/2020: Hospital admission for hypomagnesemia, hypokalemia, ongoing diarrhea.  Discharged with cholestyramine, Imodium, oral potassium and magnesium supplements -08/2020: Follow-up with Dr. Earlean Shawl.  Fecal calprotectin mildly elevated at 110. Pancreatic elastase very mildly reduced at 192.  Lactoferrin normal, FOBT-, C diff-, GI PCR panel-. Started on budesonide 9 mg/day, but only tolerated x2 weeks and discontinued. -Start Lomotil in 08/2020 with some improvement -09/2020: Follow-up with Dr. Earlean Shawl.  Was doing okay with Questran bid, but breakthrough recurrent loose stools when he tried to titrate down to 1 packet/day.  Recommended colonoscopy, but this was never scheduled -12/2020: Hospital admission for hypomagnesemia (0.8), dizziness.  Improved with IVF and mag supplementation  Today, he states he actually had constipation in 2019, which improved after colonoscopy with large polyp resectin, then recurred in 2020, with repeat colonoscopy as below. Developed diarrhea acutely in July 2021 with 3 hospital admissions as outlined above.  He continues to have 3+ loose, nonbloody stools/day.  Takes 1 Lomotil QPM, and most mornings along with  cholestyramine daily with some improvement, but not resolution. No nocturnal stools. Does feel "pressure" in the abdomen, but attributes this more to weight gain due to decreased activity/exercise.  No frank abdominal pain.  No fever, chills.   No preceding antibiotics, sick contacts, changes in medications, etc. no prior similar symptoms.  CMP normal including normal magnesium this week.   Endoscopic History: -EGD (08/2019, Dr. Earlean Shawl): 2 cm HH, otherwise normal -Colonoscopy (08/2019, Dr. Earlean Shawl): 5 mm ascending polyp, left-sided diverticulosis, large internal hemorrhoids.  Normal TI -Colonoscopy (10/2017, Dr. Earlean Shawl): 8 mm splenic flexure polyp, 2 cm pedunculated sigmoid polyp, left-sided diverticulosis, large internal hemorrhoids.  Normal TI   Past Medical History:  Diagnosis Date  . A-fib ([place]) 03/22/2018  . Anemia   . CAD (coronary artery disease)    reportedly moderate CAD; managed medically  . CHF (congestive heart failure) ([place])   . COPD (chronic obstructive pulmonary disease) ([place])   . Eczema 08/03/2014  . GERD (gastroesophageal reflux disease)   . H/O: rheumatic fever   . Heart murmur   . High risk medication use    on amiodarone since 03/31/2012  . History of colonoscopy   . Hyperglycemia 06/21/2017  . Hyperlipidemia   . Hypertension   . Kidney stone 06/21/2017  . Lobar pneumonia ([place]) 03/22/2018  . Low vitamin D level 06/21/2017  . Macular degeneration   . Osteoporosis 05/31/2016     Past Surgical History:  Procedure Laterality Date  . APPENDECTOMY    . CARDIAC CATHETERIZATION  04/22/2011   moderate left main and RCA stenosis not significant by FFR and IVUS on medical therapy  . CARDIOVERSION N/A 04/25/2018   Procedure: CARDIOVERSION;  Surgeon: Skeet Latch, MD;  Location: [facility];  Service: Cardiovascular;  Laterality: N/A;  . CERVICAL DISCECTOMY     C5,6,7 disc fused  with plate and 5 1" screws  . CHOLECYSTECTOMY    . colon polyp removal  3/1.19  . FOOT  SURGERY     right calcification removed from top of foot  . knee cartiledge Right    right knee  . MOUTH SURGERY     periodontal surgery, bridges, splint in front,    Family History  Problem Relation Age of Onset  . Heart disease Mother   . Diabetes Mother   . Cirrhosis Mother   . Emphysema Mother        never smoked but 2nd hand through her spouse  . Hypertension Mother   . Macular degeneration Mother   . Heart disease Father   . Cancer Father        prostate  . Hyperlipidemia Father   . Hypertension Father   . Varicose Veins Father   . Heart attack Father   . Peripheral vascular disease Father   . Heart disease Sister   . Arthritis Sister   . Hyperlipidemia Sister   . Obesity Sister   . Macular degeneration Sister   . Heart disease Brother        5 stents  . Hyperlipidemia Brother   . Macular degeneration Maternal Grandfather   . Cirrhosis Sister   . Obesity Sister   . Arthritis Sister   . Heart disease Sister   . Obesity Sister   . Liver disease Other   . Prostate cancer Other   . Coronary artery disease Other    Social History   Tobacco Use  . Smoking status: Former Smoker    Packs/day: 0.50    Years: 50.00    Pack years: 25.00    Types: Cigarettes    Quit date: 05/01/2013    Years since quitting: 7.6  . Smokeless tobacco: Former Network engineer  . Vaping Use: Never used  Substance Use Topics  . Alcohol use: No  . Drug use: No   Current Outpatient Medications  Medication Sig Dispense Refill  . brimonidine (ALPHAGAN) 0.2 % ophthalmic solution Place 1 drop into the left eye in the morning and at bedtime.   7  . Cholecalciferol (VITAMIN D3) 5000 units CAPS Take 10,000 Units by mouth every other day.     . cholestyramine (QUESTRAN) 4 g packet Take 1 packet (4 g total) by mouth 2 (two) times daily. 180 packet 0  . clobetasol (TEMOVATE) 0.05 % external solution APPLY 1 APPLICATION TOPICALLY TWICE DAILY (Patient taking differently: Apply 1 application  topically 2 (two) times daily as needed (irritation). APPLY 1 APPLICATION TOPICALLY TWICE DAILY AS NEEDED) 100 mL 3  . ELIQUIS 5 MG TABS tablet TAKE 1 TABLET BY MOUTH  TWICE DAILY (Patient taking differently: Take 5 mg by mouth 2 (two) times daily.) 180 tablet 3  . furosemide (LASIX) 20 MG tablet Take 1 tablet (20 mg total) by mouth daily. 90 tablet 3  . latanoprost (XALATAN) 0.005 % ophthalmic solution Place 1 drop into the left eye at bedtime.     . levalbuterol (XOPENEX HFA) 45 MCG/ACT inhaler Inhale 2 puffs into the lungs every 4 (four) hours as needed for wheezing. 45 g 2  . loperamide (IMODIUM) 2 MG capsule Take 1 capsule (2 mg total) by mouth every 6 (six) hours as needed for diarrhea or loose stools. 30 capsule 0  . Magnesium Oxide 500 MG CAPS Take 2 capsules by  mouth daily in the afternoon.    . metoprolol tartrate (LOPRESSOR) 25 MG tablet TAKE 1 TABLET BY MOUTH  TWICE DAILY (Patient taking differently: Take 25 mg by mouth 2 (two) times daily.) 180 tablet 3  . Multiple Vitamins-Minerals (ICAPS MV) TABS Take 1 tablet by mouth 2 (two) times daily.    . nitroGLYCERIN (NITROSTAT) 0.4 MG SL tablet Place 1 tablet (0.4 mg total) under the tongue every 5 (five) minutes as needed for chest pain. 30 tablet 0  . omeprazole (PRILOSEC OTC) 20 MG tablet Take 20 mg by mouth daily.    . potassium chloride (KLOR-CON) 10 MEQ tablet Take 1 tablet (10 mEq total) by mouth daily. 90 tablet 2  . propafenone (RYTHMOL) 225 MG tablet TAKE 1 TABLET BY MOUTH  TWICE DAILY 180 tablet 2  . Ranibizumab (LUCENTIS IO) Inject 1 Dose into the eye as needed (Macular degeneration).     . tamsulosin (FLOMAX) 0.4 MG CAPS capsule Take 0.4 mg by mouth every evening.     No current facility-administered medications for this visit.   Allergies  Allergen Reactions  . Albumin (Human) Anaphylaxis  . Polymyxin B-Trimethoprim Swelling    Eye drops made eyes swell  . Pseudoephedrine Other (See Comments)    Stomach cramps  . Codeine  Hives, Itching and Rash  . Guaiacol Other (See Comments)    Hallucinations  . Statins Other (See Comments)    Muscle cramps   . Meloxicam Other (See Comments)    Unknown reaction  . Pseudoephedrine-Guaifenesin Nausea And Vomiting    Stomach cramps  . Rosuvastatin Calcium Other (See Comments)    Muscle aches  . Tapentadol Other (See Comments)    Unknown reaction  . Ciprofloxacin Hives, Itching, Nausea Only and Rash  . Moxifloxacin Nausea Only and Other (See Comments)    Headaches, stomach cramps   . Rofecoxib Other (See Comments)    Stomach cramping     Review of Systems: All systems reviewed and negative except where noted in HPI.     Physical Exam:    Wt Readings from Last 3 Encounters:  12/17/20 175 lb (79.4 kg)  12/14/20 169 lb (76.7 kg)  11/21/20 173 lb 3.2 oz (78.6 kg)    BP 124/60   Pulse (!) 54   Ht 5\' 4"  (1.626 m)   Wt 175 lb (79.4 kg)   BMI 30.04 kg/m  Constitutional:  Pleasant, in no acute distress. Psychiatric: Normal mood and affect. Behavior is normal. EENT: Pupils normal.  Conjunctivae are normal. No scleral icterus. Neck supple. No cervical LAD. Cardiovascular: Normal rate, regular rhythm. No edema Pulmonary/chest: Effort normal and breath sounds normal. No wheezing, rales or rhonchi. Abdominal: Soft, nondistended, nontender. Bowel sounds active throughout. There are no masses palpable. No hepatomegaly. Neurological: Alert and oriented to person place and time. Skin: Skin is warm and dry. No rashes noted.   ASSESSMENT AND PLAN;   1) Chronic diarrhea - Check Giardia - Repeat fecal elastase. If again low, plan for Creon along with pancreatic imaging - SIBO testing (prefers vs Rifaximin) - EGD with bxs - Colonoscopy with random directed biopsies -Previous fecal calprotectin mildly elevated.  Evaluate for active inflammation at time of colonoscopy above - Start probiotic after breath testing completed -Continue cholestyramine daily and Lomotil  prn  2) Hypomagnesemia -Has normalized since recent hospitalization -Continue current supplementation -2/2 above diarrhea  3) Internal hemorrhoids -exacerbated by recent chronic diarrhea -Evaluate grade/severity at time of colonoscopy as above -Briefly discussed hemorrhoid banding.  Hemorrhoids were otherwise nonbothersome prior to recent onset diarrhea.  Can hopefully get under control with resolution of diarrhea  4) Abnormal imaging -CT in 04/2020 with gastric wall thickening -Evaluate with EGD  5) Abdominal bloating -Evaluate for mucosal/luminal pathology with EGD and colonoscopy as scheduled above  6) Atrial fibrillation 7) Systemic anticoagulation -Hold Eliquis 2 days before procedure - will instruct when and how to resume after procedure. Low but real risk of cardiovascular event such as heart attack, stroke, embolism, thrombosis or ischemia/infarct of other organs off Eliquis explained and need to seek urgent help if this occurs. The patient consents to proceed. Will communicate by phone or EMR with patient's prescribing provider to confirm that holding Eliquis is reasonable in this case   The indications, risks, and benefits of EGD and colonoscopy were explained to the patient in detail. Risks include but are not limited to bleeding, perforation, adverse reaction to medications, and cardiopulmonary compromise. Sequelae include but are not limited to the possibility of surgery, hospitalization, and mortality. The patient verbalized understanding and wished to proceed. All questions answered, referred to scheduler and bowel prep ordered. Further recommendations pending results of the exam.     Lavena Bullion, DO, FACG  12/17/2020, 8:20 AM   [name], MD

## 2020-12-17 NOTE — Telephone Encounter (Signed)
TCM call completed with patient but he declined to make an appt..He states he does not need to be seen because he is seeing the GI doctor but he does have a question. He states he was prescribed Magnesium Oxide 400mg  Bid by the hospital doctor. He wants to know if he needs to stay on it & if he does he says he will need a refill. He says the dose in the chart is wrong. He had to call the hospital dostor back because the original dose was not available at the pharmacy so he sent in the 400mg  Bid.

## 2020-12-17 NOTE — Patient Instructions (Signed)
If you are age 74 or older, your body mass index should be between 23-30. Your Body mass index is 30.04 kg/m. If this is out of the aforementioned range listed, please consider follow up with your Primary Care Provider.  If you are age 64 or younger, your body mass index should be between 19-25. Your Body mass index is 30.04 kg/m. If this is out of the aformentioned range listed, please consider follow up with your Primary Care Provider.   Due to recent changes in healthcare laws, you may see the results of your imaging and laboratory studies on MyChart before your provider has had a chance to review them.  We understand that in some cases there may be results that are confusing or concerning to you. Not all laboratory results come back in the same time frame and the provider may be waiting for multiple results in order to interpret others.  Please give [place] 48 hours in order for your provider to thoroughly review all the results before contacting the office for clarification of your results.   We will call you when our SIBO tests arrive for you to come back to the office and pick it up,If you have not heard from [place] within a week call us at [phone] to find out the status of the test kit. They have been ordered.  Thank you for choosing me and [facility].  Vito Cirigliano, D.O.

## 2020-12-18 ENCOUNTER — Other Ambulatory Visit: Payer: Self-pay

## 2020-12-18 ENCOUNTER — Other Ambulatory Visit: Payer: Medicare Other

## 2020-12-18 DIAGNOSIS — R790 Abnormal level of blood mineral: Secondary | ICD-10-CM

## 2020-12-18 DIAGNOSIS — K529 Noninfective gastroenteritis and colitis, unspecified: Secondary | ICD-10-CM

## 2020-12-18 DIAGNOSIS — R140 Abdominal distension (gaseous): Secondary | ICD-10-CM

## 2020-12-18 LAB — MAGNESIUM: Magnesium: 2 mg/dL (ref 1.5–2.5)

## 2020-12-19 ENCOUNTER — Other Ambulatory Visit: Payer: Medicare Other

## 2020-12-19 NOTE — Telephone Encounter (Signed)
Pt advised and lab order scheduled and entered. -jma

## 2020-12-23 DIAGNOSIS — H401121 Primary open-angle glaucoma, left eye, mild stage: Secondary | ICD-10-CM | POA: Diagnosis not present

## 2020-12-23 DIAGNOSIS — H401124 Primary open-angle glaucoma, left eye, indeterminate stage: Secondary | ICD-10-CM | POA: Diagnosis not present

## 2020-12-23 DIAGNOSIS — Z9889 Other specified postprocedural states: Secondary | ICD-10-CM | POA: Diagnosis not present

## 2020-12-24 ENCOUNTER — Telehealth: Payer: Self-pay | Admitting: General Surgery

## 2020-12-24 LAB — GIARDIA ANTIGEN
MICRO NUMBER:: 11654371
RESULT:: NOT DETECTED
SPECIMEN QUALITY:: ADEQUATE

## 2020-12-24 LAB — PANCREATIC ELASTASE, FECAL: Pancreatic Elastase-1, Stool: 413 mcg/g

## 2020-12-24 NOTE — Telephone Encounter (Signed)
-----   Message from Letta Pate, [place] sent at 12/18/2020 10:11 AM EDT ----- Regarding: needs SIBO test Call when Sibo tests arrive

## 2020-12-24 NOTE — Telephone Encounter (Signed)
LVM for the patient to contact the clinic. Received SIBO tests and would like for him to pick one up at the [facility] office.

## 2020-12-24 NOTE — Telephone Encounter (Signed)
Faxed paper work to [employer] for SIBO test/

## 2020-12-24 NOTE — Telephone Encounter (Signed)
Patient came to clinic to pick up his SIBO test, patient was told not to do the test until he hears from [facility]. Expressed to him the will call from a 617 area code.

## 2020-12-26 ENCOUNTER — Other Ambulatory Visit: Payer: Self-pay

## 2020-12-26 ENCOUNTER — Other Ambulatory Visit (INDEPENDENT_AMBULATORY_CARE_PROVIDER_SITE_OTHER): Payer: Medicare Other

## 2020-12-26 DIAGNOSIS — R790 Abnormal level of blood mineral: Secondary | ICD-10-CM

## 2020-12-26 LAB — MAGNESIUM: Magnesium: 1.5 mg/dL (ref 1.5–2.5)

## 2020-12-28 ENCOUNTER — Encounter: Payer: Self-pay | Admitting: Family Medicine

## 2020-12-28 DIAGNOSIS — R790 Abnormal level of blood mineral: Secondary | ICD-10-CM

## 2021-01-02 ENCOUNTER — Encounter: Payer: Self-pay | Admitting: Family Medicine

## 2021-01-02 DIAGNOSIS — H353231 Exudative age-related macular degeneration, bilateral, with active choroidal neovascularization: Secondary | ICD-10-CM | POA: Diagnosis not present

## 2021-01-02 NOTE — Telephone Encounter (Signed)
magnesium oxide (MAG-OX) 400 MG tablet [[id number]]    Patient states he needs clarification on how many tablets to take. Patient states prescription calls to take 1 tablet and not two,like discussed previously.

## 2021-01-03 ENCOUNTER — Other Ambulatory Visit: Payer: Self-pay | Admitting: Family Medicine

## 2021-01-03 DIAGNOSIS — R790 Abnormal level of blood mineral: Secondary | ICD-10-CM

## 2021-01-03 MED ORDER — MAGNESIUM OXIDE 400 MG PO TABS: 400.0000 mg | ORAL_TABLET | Freq: Two times a day (BID) | ORAL | 3 refills | Status: DC

## 2021-01-03 MED ORDER — MAGNESIUM OXIDE 400 MG PO TABS: 400.0000 mg | ORAL_TABLET | Freq: Three times a day (TID) | ORAL | 1 refills | Status: DC | PRN

## 2021-01-03 NOTE — Telephone Encounter (Signed)
Spoke with to clarify medication, pt would like you to know that he is taking 3 tabs instead of 4 and it is helping with his diarrhea issue.

## 2021-01-14 NOTE — Telephone Encounter (Signed)
Contacted [name] and explained that [name] with [facility] stated they sent an email out on 01/03/2021. The patient stated he did not receive it. He also stated that if it comes in as spam his email is set to delete them right away. Pt informed he will receive a call from them today before the close of business and if he does not here from them to please contact me back in the morning. Patient verbalized understanding.

## 2021-01-14 NOTE — Telephone Encounter (Signed)
Contacted [facility] and spoke with [name]. She stated that they sent an email to the patient on 01/03/2021 regarding his SIBO test. [name] said someone would reach out to the patient by the close of business today.

## 2021-01-14 NOTE — Telephone Encounter (Signed)
Inbound call from patient to let you know he still have not received a call and want to know what he should do for the test.

## 2021-01-17 DIAGNOSIS — R140 Abdominal distension (gaseous): Secondary | ICD-10-CM | POA: Diagnosis not present

## 2021-01-17 DIAGNOSIS — K529 Noninfective gastroenteritis and colitis, unspecified: Secondary | ICD-10-CM | POA: Diagnosis not present

## 2021-01-19 ENCOUNTER — Other Ambulatory Visit: Payer: Self-pay | Admitting: Family Medicine

## 2021-01-19 DIAGNOSIS — R197 Diarrhea, unspecified: Secondary | ICD-10-CM

## 2021-01-28 ENCOUNTER — Ambulatory Visit (AMBULATORY_SURGERY_CENTER): Payer: Medicare Other | Admitting: Gastroenterology

## 2021-01-28 ENCOUNTER — Other Ambulatory Visit: Payer: Self-pay | Admitting: Gastroenterology

## 2021-01-28 ENCOUNTER — Other Ambulatory Visit: Payer: Self-pay

## 2021-01-28 ENCOUNTER — Encounter: Payer: Self-pay | Admitting: Gastroenterology

## 2021-01-28 VITALS — BP 115/46 | HR 48 | Temp 96.2°F | Resp 12 | Ht 64.0 in | Wt 175.0 lb

## 2021-01-28 DIAGNOSIS — R140 Abdominal distension (gaseous): Secondary | ICD-10-CM

## 2021-01-28 DIAGNOSIS — K529 Noninfective gastroenteritis and colitis, unspecified: Secondary | ICD-10-CM | POA: Diagnosis not present

## 2021-01-28 DIAGNOSIS — K641 Second degree hemorrhoids: Secondary | ICD-10-CM | POA: Diagnosis not present

## 2021-01-28 DIAGNOSIS — K52832 Lymphocytic colitis: Secondary | ICD-10-CM | POA: Diagnosis not present

## 2021-01-28 DIAGNOSIS — K317 Polyp of stomach and duodenum: Secondary | ICD-10-CM

## 2021-01-28 DIAGNOSIS — K635 Polyp of colon: Secondary | ICD-10-CM

## 2021-01-28 DIAGNOSIS — K5730 Diverticulosis of large intestine without perforation or abscess without bleeding: Secondary | ICD-10-CM | POA: Diagnosis not present

## 2021-01-28 DIAGNOSIS — D125 Benign neoplasm of sigmoid colon: Secondary | ICD-10-CM

## 2021-01-28 DIAGNOSIS — R194 Change in bowel habit: Secondary | ICD-10-CM | POA: Diagnosis not present

## 2021-01-28 DIAGNOSIS — K5150 Left sided colitis without complications: Secondary | ICD-10-CM | POA: Diagnosis not present

## 2021-01-28 HISTORY — PX: COLONOSCOPY WITH ESOPHAGOGASTRODUODENOSCOPY (EGD): SHX5779

## 2021-01-28 MED ORDER — SODIUM CHLORIDE 0.9 % IV SOLN: 500.0000 mL | Freq: Once | INTRAVENOUS | Status: DC

## 2021-01-28 NOTE — Progress Notes (Signed)
VS-KW

## 2021-01-28 NOTE — Progress Notes (Signed)
pt tolerated well. VSS. awake and to recovery. Report given to RN. Bite block left insitu to recovery. 

## 2021-01-28 NOTE — Op Note (Signed)
[facility] Patient Name: Ryan [name] Procedure Date: 01/28/2021 12:10 PM MRN: [id number] Endoscopist: Gerrit Heck , MD Age: 75 Referring MD:  Date of Birth: 08/24/1946 Gender: Male Account #: [id number] Procedure:                Upper GI endoscopy Indications:              Abdominal bloating, Diarrhea Medicines:                Monitored Anesthesia Care Procedure:                Pre-Anesthesia Assessment:                           - Prior to the procedure, a History and Physical                            was performed, and patient medications and                            allergies were reviewed. The patient's tolerance of                            previous anesthesia was also reviewed. The risks                            and benefits of the procedure and the sedation                            options and risks were discussed with the patient.                            All questions were answered, and informed consent                            was obtained. Prior Anticoagulants: The patient has                            taken no previous anticoagulant or antiplatelet                            agents. ASA Grade Assessment: III - A patient with                            severe systemic disease. After reviewing the risks                            and benefits, the patient was deemed in                            satisfactory condition to undergo the procedure.                           After obtaining informed consent, the endoscope was  passed under direct vision. Throughout the                            procedure, the patient's blood pressure, pulse, and                            oxygen saturations were monitored continuously. The                            Endoscope was introduced through the mouth, and                            advanced to the second part of duodenum. The upper                            GI endoscopy was  accomplished without difficulty.                            The patient tolerated the procedure well. Scope In: Scope Out: Findings:                 The examined esophagus was normal.                           Multiple 2 to 5 mm sessile polyps with no bleeding                            were found in the gastric fundus and in the gastric                            body. These polyps were removed with a cold biopsy                            forceps. Resection and retrieval were complete.                            Estimated blood loss was minimal.                           Diffuse mildly congested mucosa was found in the                            gastric fundus and in the gastric body. Biopsies                            were taken with a cold forceps for histology.                            Estimated blood loss was minimal.                           The incisura, gastric antrum and pylorus were  normal.                           The examined duodenum was normal. Biopsies for                            histology were taken with a cold forceps for                            evaluation of celiac disease. Estimated blood loss                            was minimal. Complications:            No immediate complications. Estimated Blood Loss:     Estimated blood loss was minimal. Impression:               - Normal esophagus.                           - Multiple gastric polyps. Resected and retrieved.                           - Congestive gastropathy. Biopsied.                           - Normal incisura, antrum and pylorus.                           - Normal examined duodenum. Biopsied. Recommendation:           - Patient has a contact number available for                            emergencies. The signs and symptoms of potential                            delayed complications were discussed with the                            patient. Return to normal  activities tomorrow.                            Written discharge instructions were provided to the                            patient.                           - Resume previous diet.                           - Continue present medications.                           - Await pathology results.                           -  Continue present medications.                           - Colonoscopy today.                           - Return to GI clinic at appointment to be                            scheduled. Gerrit Heck, MD 01/28/2021 2:24:55 PM

## 2021-01-28 NOTE — Progress Notes (Signed)
Called to room to assist during endoscopic procedure.  Patient ID and intended procedure confirmed with present staff. Received instructions for my participation in the procedure from the performing physician.  

## 2021-01-28 NOTE — Patient Instructions (Signed)
Handouts given for polyps, diverticulosis, hemorrhoids, and hemorrhoid banding.  Resume Eliquis at prior dose today.   YOU HAD AN ENDOSCOPIC PROCEDURE TODAY AT [facility]:   Refer to the procedure report that was given to you for any specific questions about what was found during the examination.  If the procedure report does not answer your questions, please call your gastroenterologist to clarify.  If you requested that your care partner not be given the details of your procedure findings, then the procedure report has been included in a sealed envelope for you to review at your convenience later.  YOU SHOULD EXPECT: Some feelings of bloating in the abdomen. Passage of more gas than usual.  Walking can help get rid of the air that was put into your GI tract during the procedure and reduce the bloating. If you had a lower endoscopy (such as a colonoscopy or flexible sigmoidoscopy) you may notice spotting of blood in your stool or on the toilet paper. If you underwent a bowel prep for your procedure, you may not have a normal bowel movement for a few days.  Please Note:  You might notice some irritation and congestion in your nose or some drainage.  This is from the oxygen used during your procedure.  There is no need for concern and it should clear up in a day or so.  SYMPTOMS TO REPORT IMMEDIATELY:   Following lower endoscopy (colonoscopy or flexible sigmoidoscopy):  Excessive amounts of blood in the stool  Significant tenderness or worsening of abdominal pains  Swelling of the abdomen that is new, acute  Fever of 100F or higher   Following upper endoscopy (EGD)  Vomiting of blood or coffee ground material  New chest pain or pain under the shoulder blades  Painful or persistently difficult swallowing  New shortness of breath  Black, tarry-looking stools  For urgent or emergent issues, a gastroenterologist can be reached at any hour by calling [phone]. Do not  use MyChart messaging for urgent concerns.    DIET:  We do recommend a small meal at first, but then you may proceed to your regular diet.  Drink plenty of fluids but you should avoid alcoholic beverages for 24 hours.  ACTIVITY:  You should plan to take it easy for the rest of today and you should NOT DRIVE or use heavy machinery until tomorrow (because of the sedation medicines used during the test).    FOLLOW UP: Our staff will call the number listed on your records 48-72 hours following your procedure to check on you and address any questions or concerns that you may have regarding the information given to you following your procedure. If we do not reach you, we will leave a message.  We will attempt to reach you two times.  During this call, we will ask if you have developed any symptoms of COVID 19. If you develop any symptoms (ie: fever, flu-like symptoms, shortness of breath, cough etc.) before then, please call [phone].  If you test positive for Covid 19 in the 2 weeks post procedure, please call and report this information to [place].    If any biopsies were taken you will be contacted by phone or by letter within the next 1-3 weeks.  Please call us at [phone] if you have not heard about the biopsies in 3 weeks.    SIGNATURES/CONFIDENTIALITY: You and/or your care partner have signed paperwork which will be entered into your electronic medical record.  These signatures attest to the fact that that the information above on your After Visit Summary has been reviewed and is understood.  Full responsibility of the confidentiality of this discharge information lies with you and/or your care-partner.

## 2021-01-28 NOTE — Op Note (Signed)
[facility] Patient Name: Ryan [name] Procedure Date: 01/28/2021 12:09 PM MRN: [id number] Endoscopist: Gerrit Heck , MD Age: 75 Referring MD:  Date of Birth: 29-Dec-1945 Gender: Male Account #: [id number] Procedure:                Colonoscopy Indications:              Chronic diarrhea, Change in bowel habits Medicines:                Monitored Anesthesia Care Procedure:                Pre-Anesthesia Assessment:                           - Prior to the procedure, a History and Physical                            was performed, and patient medications and                            allergies were reviewed. The patient's tolerance of                            previous anesthesia was also reviewed. The risks                            and benefits of the procedure and the sedation                            options and risks were discussed with the patient.                            All questions were answered, and informed consent                            was obtained. Prior Anticoagulants: The patient has                            taken no previous anticoagulant or antiplatelet                            agents. ASA Grade Assessment: III - A patient with                            severe systemic disease. After reviewing the risks                            and benefits, the patient was deemed in                            satisfactory condition to undergo the procedure.                           After obtaining informed consent, the colonoscope  was passed under direct vision. Throughout the                            procedure, the patient's blood pressure, pulse, and                            oxygen saturations were monitored continuously. The                            Olympus CF-HQ190 (#[id number]) Colonoscope was                            introduced through the anus and advanced to the the                            terminal ileum. The  colonoscopy was performed                            without difficulty. The patient tolerated the                            procedure well. The quality of the bowel                            preparation was good. The terminal ileum, ileocecal                            valve, appendiceal orifice, and rectum were                            photographed. Scope In: 2:00:50 PM Scope Out: 2:20:07 PM Scope Withdrawal Time: 0 hours 15 minutes 56 seconds  Total Procedure Duration: 0 hours 19 minutes 17 seconds  Findings:                 Hemorrhoids were found on perianal exam.                           A 2 mm polyp was found in the sigmoid colon. The                            polyp was sessile. The polyp was removed with a                            cold biopsy forceps. Resection and retrieval were                            complete. Estimated blood loss was minimal.                           Multiple small-mouthed diverticula were found in                            the sigmoid colon.  Non-bleeding internal hemorrhoids were found during                            retroflexion. The hemorrhoids were small and Grade                            II (internal hemorrhoids that prolapse but reduce                            spontaneously).                           The mucosa was otherwise normal appearing                            throughout the colon. Biopsies for histology were                            taken with a cold forceps from the right colon and                            left colon for evaluation of microscopic colitis.                            Estimated blood loss was minimal.                           The terminal ileum appeared normal. Complications:            No immediate complications. Estimated Blood Loss:     Estimated blood loss was minimal. Impression:               - Hemorrhoids found on perianal exam.                           - One 2 mm polyp  in the sigmoid colon, removed with                            a cold biopsy forceps. Resected and retrieved.                           - Diverticulosis in the sigmoid colon.                           - Non-bleeding internal hemorrhoids.                           - Normal mucosa in the entire examined colon.                            Biopsied.                           - The examined portion of the ileum was normal. Recommendation:           - Patient has a contact number available for  emergencies. The signs and symptoms of potential                            delayed complications were discussed with the                            patient. Return to normal activities tomorrow.                            Written discharge instructions were provided to the                            patient.                           - Resume previous diet.                           - Continue present medications.                           - Await pathology results.                           - Repeat colonoscopy in 5-10 years for surveillance                            based on pathology results.                           - Return to GI office at appointment to be                            scheduled. If pathology unrevealing and symptoms                            persist, will plan on extended serologic/stool                            work-up and possible cross-sectional imaging.                           - Internal hemorrhoids were noted on this study and                            may be amenable to hemorrhoid band ligation. If you                            are interested in further treatment of these                            hemorrhoids with band ligation, please contact my                            clinic to set up an appointment for evaluation and  treatment. Gerrit Heck, MD 01/28/2021 2:29:18 PM

## 2021-01-29 ENCOUNTER — Telehealth: Payer: Self-pay

## 2021-01-29 NOTE — Telephone Encounter (Signed)
Per 01/28/21 procedure note - Return to GI office at appointment to be scheduled.   Spoke with patient and he has been scheduled for a follow up with Dr. Bryan Lemma on Wednesday, 03/05/21 at 9 AM. Patient states that he does not need a letter with the appointment information. He verbalized understanding of all information and had no concerns at the end of the call.

## 2021-01-30 ENCOUNTER — Telehealth: Payer: Self-pay | Admitting: *Deleted

## 2021-01-30 NOTE — Telephone Encounter (Signed)
  Follow up Call-  Call back number 01/28/2021  Post procedure Call Back phone  # [phone]  Permission to leave phone message Yes  Some recent data might be hidden     Patient questions:  Do you have a fever, pain , or abdominal swelling? No. Pain Score  0 *  Have you tolerated food without any problems? Yes.    Have you been able to return to your normal activities? Yes.    Do you have any questions about your discharge instructions: Diet   No. Medications  No. Follow up visit  No.  Do you have questions or concerns about your Care? No.  Actions: * If pain score is 4 or above: No action needed, pain <4.

## 2021-02-10 ENCOUNTER — Other Ambulatory Visit: Payer: Self-pay | Admitting: Family Medicine

## 2021-02-10 DIAGNOSIS — R197 Diarrhea, unspecified: Secondary | ICD-10-CM

## 2021-02-12 ENCOUNTER — Encounter: Payer: Self-pay | Admitting: General Surgery

## 2021-02-12 ENCOUNTER — Telehealth: Payer: Self-pay | Admitting: General Surgery

## 2021-02-12 MED ORDER — BUDESONIDE ER 9 MG PO CP24: 9.0000 mg | ORAL_CAPSULE | Freq: Every day | ORAL | 0 refills | Status: DC

## 2021-02-12 MED ORDER — BUDESONIDE 3 MG PO CPEP: ORAL_CAPSULE | ORAL | 0 refills | Status: DC

## 2021-02-12 NOTE — Telephone Encounter (Signed)
-----   Message from [place], DO sent at 02/11/2021 12:51 PM EDT ----- The biopsies taken during the recent endoscopic procedures are as follows:  Upper Endoscopy: - The biopsies taken from your small intestine were normal and there was no evidence of Celiac Disease.  - The biopsies taken from your stomach were normal and there was no evidence of inflammation or Helicobacter pylori infection.  -The polyps resected from your stomach were benign fundic gland polyps.  There was no evidence of infection with Helicobacter pylori or intestinal metaplasia/dysplasia.  These types of polyps are typically secondary to acid suppression therapy and no specific follow-up required for these small, benign polyps.  Colonoscopy: -The polyp removed during the colonoscopy was benign lymphoid aggregate.  This harbors no malignant potential and requires no specific follow-up.  No specific colonoscopy recall due to this benign finding and age >35. 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  This is a chronic inflammatory condition that is typically due to medications but can also be associated with other autoimmune diseases (i.e. diabetes, thyroid disorders, celiac disease, etc.).  Treatment of this condition should be as follows  -Review for associated medications, most commonly NSAIDs, PPIs, ranitidine, statins, SSRIs. -If currently smoking, strongly recommend smoking cessation -Start with loperamide for mild diarrhea (<3 stools daily).  If nocturnal symptoms are worst, start his nightly. -In patients with >3 stools daily, or no improvement with loperamide, start budesonide 9 mg daily for 6 to 8 weeks then taper to 6 mg x 2 weeks, 3 mg x 2 weeks, discontinue.  If symptoms persist or recur during tapering, continue budesonide 9 mg x 12 weeks then slowly taper. -Symptomatic relapse occurs in up to 80% after budesonide treatment which can sometimes be controlled with  budesonide 3 to 6 mg/day x 6 to 12 months versus intermittent retreatment courses of 6-8 weeks.

## 2021-02-12 NOTE — Telephone Encounter (Signed)
Contacted the patient and advised him of his results. The patient had a real bad headache and was a bit out of it when we spoke. Expressed to him about the associated medications that he will need to refrain from and no smoking. Patient states no improvement with imodium and stools are the same, patient requested Budesonide  To be sent to the pharmacy. Sent 2 separate rx's due to the titration. Patient is to return to the clinic on 03/05/2021. He also would like for me to check on his SIBO breath test.

## 2021-02-13 NOTE — Telephone Encounter (Signed)
Inbound call from patient. Have questions about the medications sent to the pharmacy 5/12. States he only thought there would be one. Best contact number [phone]

## 2021-02-14 NOTE — Telephone Encounter (Signed)
Patient is calling to follow up on previous message has not received a call as of yet.

## 2021-02-14 NOTE — Telephone Encounter (Signed)
Spoke with Leafy Ro at [facility]. pts insurance covers budesonide but only the 3mg  tablets. Patient would need to take 3 daily to get to the 9mg . Tried to change rx but ins states refill to soon. Patient picked up rx for 42 the other day. Left a message for the patient to contact me back

## 2021-02-14 NOTE — Telephone Encounter (Signed)
Spoke with Ryan [name] and explained we did not sent out the rx for Ortikos it was sent for budesonide. I expressed to him I would contact the pharmacy in regards to this. Ortikos is not covered and budesonide is.

## 2021-02-14 NOTE — Telephone Encounter (Signed)
Left a message on the pharmacy voicemail to change the patients rx to budesonide not ortikos. The rx was sent for budesonide not ortikos. The patients insurance only covers budesonide

## 2021-02-20 DIAGNOSIS — Z9889 Other specified postprocedural states: Secondary | ICD-10-CM | POA: Diagnosis not present

## 2021-02-20 DIAGNOSIS — Z9841 Cataract extraction status, right eye: Secondary | ICD-10-CM | POA: Diagnosis not present

## 2021-02-20 DIAGNOSIS — Z9842 Cataract extraction status, left eye: Secondary | ICD-10-CM | POA: Diagnosis not present

## 2021-02-20 DIAGNOSIS — H353231 Exudative age-related macular degeneration, bilateral, with active choroidal neovascularization: Secondary | ICD-10-CM | POA: Diagnosis not present

## 2021-02-20 DIAGNOSIS — Z961 Presence of intraocular lens: Secondary | ICD-10-CM | POA: Diagnosis not present

## 2021-02-20 DIAGNOSIS — H26493 Other secondary cataract, bilateral: Secondary | ICD-10-CM | POA: Diagnosis not present

## 2021-02-21 DIAGNOSIS — Z23 Encounter for immunization: Secondary | ICD-10-CM | POA: Diagnosis not present

## 2021-02-24 ENCOUNTER — Other Ambulatory Visit: Payer: Self-pay | Admitting: Family Medicine

## 2021-02-24 DIAGNOSIS — R790 Abnormal level of blood mineral: Secondary | ICD-10-CM

## 2021-02-24 NOTE — Telephone Encounter (Signed)
Last magnesium checked on 12/26/20 and was 1.5.  Do you want to continue?

## 2021-03-04 ENCOUNTER — Ambulatory Visit (INDEPENDENT_AMBULATORY_CARE_PROVIDER_SITE_OTHER): Payer: Medicare Other | Admitting: Family Medicine

## 2021-03-04 ENCOUNTER — Other Ambulatory Visit: Payer: Self-pay

## 2021-03-04 ENCOUNTER — Encounter: Payer: Self-pay | Admitting: Family Medicine

## 2021-03-04 VITALS — BP 138/72 | HR 51 | Temp 98.0°F | Resp 12 | Ht 64.0 in | Wt 168.6 lb

## 2021-03-04 DIAGNOSIS — E559 Vitamin D deficiency, unspecified: Secondary | ICD-10-CM | POA: Diagnosis not present

## 2021-03-04 DIAGNOSIS — E782 Mixed hyperlipidemia: Secondary | ICD-10-CM

## 2021-03-04 DIAGNOSIS — I10 Essential (primary) hypertension: Secondary | ICD-10-CM

## 2021-03-04 DIAGNOSIS — Z789 Other specified health status: Secondary | ICD-10-CM | POA: Diagnosis not present

## 2021-03-04 DIAGNOSIS — R350 Frequency of micturition: Secondary | ICD-10-CM

## 2021-03-04 DIAGNOSIS — R197 Diarrhea, unspecified: Secondary | ICD-10-CM | POA: Diagnosis not present

## 2021-03-04 DIAGNOSIS — R739 Hyperglycemia, unspecified: Secondary | ICD-10-CM | POA: Diagnosis not present

## 2021-03-04 DIAGNOSIS — I5032 Chronic diastolic (congestive) heart failure: Secondary | ICD-10-CM

## 2021-03-04 NOTE — Patient Instructions (Signed)

## 2021-03-04 NOTE — Assessment & Plan Note (Signed)
Supplement and monitor 

## 2021-03-04 NOTE — Assessment & Plan Note (Signed)
Down from 5 to 3 episodes but not resolved. He does not feel budesonide has been notably helpful. He sees GI in 2 days to discuss

## 2021-03-04 NOTE — Progress Notes (Signed)
Patient ID: Ryan [name], male    DOB: 1946/03/02  Age: 75 y.o. MRN: [id number]    Subjective:  Subjective  HPI Ryan [name] presents for office visit for follow up on diarrhea and medication management. He states that Budesonide worsens the side effects he experiences from Lasix. He states that it does not help with the diarrhea. He denies any chest pain, SOB, fever, abdominal pain, cough, chills, sore throat, dysuria, urinary incontinence, back pain, HA, or N/V. He reports that he has had 140 injections into his eyes and that his sister went blind secondary to her diabetes. He reports that he has had multiple surgeries on his eyes due to his glaucoma and other complications.   He reports that he has started experiencing diarrhea since July 8th of last year. He states that on that day he had to go to the ED where they could not find what was wrong. He states that he experiences more urgency than frequency. He states that more gas is present while experiencing the diarrhea. He expresses worry regarding that his semen is sometimes brown colored. He states that he bikes for 6 miles a day and has cut down from 3 to 2 meals a day, which he states has lessened his symptoms of diarrhea.   He reports experiencing redness and swelling of the right side of the chest secondary to a possible spider bite. He states that initially, the area affected was rock hard, but now it has improved. He states taking benadryl tablets and topical creams has helped improve the swelling and redness.   Review of Systems  Constitutional: Negative for chills, fatigue and fever.  HENT: Negative for congestion, rhinorrhea, sinus pressure, sinus pain and sore throat.   Eyes: Negative for pain.  Respiratory: Negative for cough and shortness of breath.   Cardiovascular: Negative for chest pain, palpitations and leg swelling.  Gastrointestinal: Positive for diarrhea. Negative for abdominal pain, blood in stool, nausea and  vomiting.  Genitourinary: Negative for flank pain, frequency and penile pain.  Musculoskeletal: Negative for back pain.  Neurological: Negative for headaches.    History Past Medical History:  Diagnosis Date  . A-fib ([place]) 03/22/2018  . Anemia   . CAD (coronary artery disease)    reportedly moderate CAD; managed medically  . Cataract    removed from both eyes  . CHF (congestive heart failure) ([place])   . COPD (chronic obstructive pulmonary disease) ([place])   . Eczema 08/03/2014  . GERD (gastroesophageal reflux disease)   . H/O: rheumatic fever   . Heart murmur   . High risk medication use    on amiodarone since 03/31/2012  . History of colonoscopy   . Hyperglycemia 06/21/2017  . Hyperlipidemia   . Hypertension   . Kidney stone 06/21/2017  . Lobar pneumonia ([place]) 03/22/2018  . Low vitamin D level 06/21/2017  . Macular degeneration   . Osteoporosis 05/31/2016    He has a past surgical history that includes Cervical discectomy; Cholecystectomy; Mouth surgery; knee cartiledge (Right); Foot surgery; Appendectomy; Cardiac catheterization (04/22/2011); Cardioversion (N/A, 04/25/2018); colon polyp removal (3/1.19); Colonoscopy; and Upper gastrointestinal endoscopy.   His family history includes Arthritis in his sister and sister; Cancer in his father; Cirrhosis in his mother and sister; Coronary artery disease in an other family member; Diabetes in his mother; Emphysema in his mother; Heart attack in his father; Heart disease in his brother, father, mother, sister, and sister; Hyperlipidemia in his brother, father, and sister; Hypertension in his  father and mother; Liver disease in an other family member; Macular degeneration in his maternal grandfather, mother, and sister; Obesity in his sister, sister, and sister; Peripheral vascular disease in his father; Prostate cancer in an other family member; Varicose Veins in his father.He reports that he quit smoking about 11 years ago. His smoking use  included cigarettes. He has a 25.00 pack-year smoking history. He has quit using smokeless tobacco.  His smokeless tobacco use included chew. He reports current alcohol use of about 1.0 standard drink of alcohol per week. He reports that he does not use drugs.  Current Outpatient Medications on File Prior to Visit  Medication Sig Dispense Refill  . amoxicillin (AMOXIL) 500 MG capsule SMARTSIG:4 Capsule(s) By Mouth Once    . brimonidine (ALPHAGAN) 0.2 % ophthalmic solution Place 1 drop into the left eye in the morning and at bedtime.   7  . [START ON 04/14/2021] budesonide (ENTOCORT EC) 3 MG 24 hr capsule Take 2 capsules (6 mg total) by mouth daily for 14 days, THEN 1 capsule (3 mg total) daily for 14 days. 42 capsule 0  . Cholecalciferol (VITAMIN D3) 5000 units CAPS Take 10,000 Units by mouth every other day.     . clobetasol (TEMOVATE) 0.05 % external solution APPLY 1 APPLICATION TOPICALLY TWICE DAILY (Patient taking differently: Apply 1 application topically 2 (two) times daily as needed (irritation). APPLY 1 APPLICATION TOPICALLY TWICE DAILY AS NEEDED) 100 mL 3  . ELIQUIS 5 MG TABS tablet TAKE 1 TABLET BY MOUTH  TWICE DAILY (Patient taking differently: Take 5 mg by mouth 2 (two) times daily.) 180 tablet 3  . fluconazole (DIFLUCAN) 100 MG tablet Take by mouth.    . furosemide (LASIX) 20 MG tablet Take 1 tablet (20 mg total) by mouth daily. 90 tablet 3  . hydrocortisone 2.5 % lotion Apply 1 application topically 2 (two) times daily.    Marland Kitchen latanoprost (XALATAN) 0.005 % ophthalmic solution Place 1 drop into the left eye at bedtime.     . levalbuterol (XOPENEX HFA) 45 MCG/ACT inhaler Inhale 2 puffs into the lungs every 4 (four) hours as needed for wheezing. 45 g 2  . magnesium oxide (MAG-OX) 400 (240 Mg) MG tablet TAKE 1 TABLET(400 MG) BY MOUTH THREE TIMES DAILY AS NEEDED 90 tablet 1  . Melatonin 3 MG CAPS 1 cap PO QD    . metoprolol tartrate (LOPRESSOR) 25 MG tablet TAKE 1 TABLET BY MOUTH  TWICE DAILY  (Patient taking differently: Take 25 mg by mouth 2 (two) times daily.) 180 tablet 3  . Multiple Vitamins-Minerals (ICAPS MV) TABS Take 1 tablet by mouth 2 (two) times daily.    . nitroGLYCERIN (NITROSTAT) 0.4 MG SL tablet Place 1 tablet (0.4 mg total) under the tongue every 5 (five) minutes as needed for chest pain. 30 tablet 0  . omeprazole (PRILOSEC OTC) 20 MG tablet Take 20 mg by mouth daily.    . potassium chloride (KLOR-CON) 10 MEQ tablet Take 1 tablet (10 mEq total) by mouth daily. 90 tablet 2  . propafenone (RYTHMOL) 225 MG tablet TAKE 1 TABLET BY MOUTH  TWICE DAILY 180 tablet 2  . Ranibizumab (LUCENTIS IO) Inject 1 Dose into the eye as needed (Macular degeneration).     . tamsulosin (FLOMAX) 0.4 MG CAPS capsule Take 0.4 mg by mouth every evening.     No current facility-administered medications on file prior to visit.     Objective:  Objective  Physical Exam Constitutional:  General: He is not in acute distress.    Appearance: Normal appearance. He is not ill-appearing or toxic-appearing.  HENT:     Head: Normocephalic and atraumatic.     Right Ear: Tympanic membrane, ear canal and external ear normal.     Left Ear: Tympanic membrane, ear canal and external ear normal.     Nose: No congestion or rhinorrhea.  Eyes:     Extraocular Movements: Extraocular movements intact.     Pupils: Pupils are equal, round, and reactive to light.  Cardiovascular:     Rate and Rhythm: Normal rate and regular rhythm.     Pulses: Normal pulses.     Heart sounds: Normal heart sounds. No murmur heard.   Pulmonary:     Effort: Pulmonary effort is normal. No respiratory distress.     Breath sounds: Normal breath sounds. No wheezing, rhonchi or rales.  Abdominal:     General: Bowel sounds are normal.     Palpations: Abdomen is soft. There is no mass.     Tenderness: There is no abdominal tenderness. There is no guarding.     Hernia: No hernia is present.  Musculoskeletal:        General:  Normal range of motion.     Cervical back: Normal range of motion and neck supple.  Skin:    General: Skin is warm and dry.  Neurological:     Mental Status: He is alert and oriented to person, place, and time.  Psychiatric:        Behavior: Behavior normal.    BP 138/72 (BP Location: Left Arm, Cuff Size: Normal)   Pulse (!) 51   Temp 98 F (36.7 C) (Oral)   Resp 12   Ht 5\' 4"  (1.626 m)   Wt 168 lb 9.6 oz (76.5 kg)   SpO2 96%   BMI 28.94 kg/m  Wt Readings from Last 3 Encounters:  03/04/21 168 lb 9.6 oz (76.5 kg)  01/28/21 175 lb (79.4 kg)  12/17/20 175 lb (79.4 kg)     Lab Results  Component Value Date   WBC 8.7 12/15/2020   HGB 11.4 (L) 12/15/2020   HCT 32.8 (L) 12/15/2020   PLT 182 12/15/2020   GLUCOSE 80 12/16/2020   CHOL 220 (H) 10/02/2020   TRIG 142.0 10/02/2020   HDL 37.80 (L) 10/02/2020   LDLDIRECT 134.2 12/01/2013   LDLCALC 153 (H) 10/02/2020   ALT 9 12/16/2020   AST 13 12/16/2020   NA 141 12/16/2020   K 4.3 12/16/2020   CL 106 12/16/2020   CREATININE 1.06 12/16/2020   BUN 9 12/16/2020   CO2 30 12/16/2020   TSH 2.46 10/02/2020   PSA 1.64 01/14/2015   INR 1.5 (H) 08/26/2020   HGBA1C 5.1 10/02/2020    No results found.   Assessment & Plan:  Plan    No orders of the defined types were placed in this encounter.   Problem List Items Addressed This Visit    Hyperlipidemia, mixed    Encouraged heart healthy diet, increase exercise, avoid trans fats, consider a krill oil cap daily      Relevant Orders   Hemoglobin A1c   Lipid panel   Essential hypertension    Well controlled, no changes to meds. Encouraged heart healthy diet such as the DASH diet and exercise as tolerated.       Relevant Orders   Hemoglobin A1c   CBC   Comprehensive metabolic panel   TSH   Hyperglycemia  hgba1c acceptable, minimize simple carbs. Increase exercise as tolerated.       Relevant Orders   Hemoglobin A1c   CHF (congestive heart failure) (HCC)    Relevant Orders   Hemoglobin A1c   Vitamin D deficiency    Supplement and monitor      Relevant Orders   Hemoglobin A1c   VITAMIN D 25 Hydroxy (Vit-D Deficiency, Fractures)   Statin intolerance    Check lipids today, h/o myalgias with statins      Hypomagnesemia    Supplement and monitor. He is taking 1200 mg of Mag ox daily      Relevant Orders   Magnesium   Diarrhea    Down from 5 to 3 episodes but not resolved. He does not feel budesonide has been notably helpful. He sees GI in 2 days to discuss      Relevant Orders   Hemoglobin A1c   Urinary frequency - Primary    He notes an increase in urinary frequency and even occasional dysuria since starting budesonide. Will check UA and culture and PSA today      Relevant Orders   Hemoglobin A1c   Urine Culture   Urinalysis   PSA      Follow-up: Return in about 6 months (around 09/03/2021).   I,David Hanna,acting as a scribe for Penni Homans, MD.,have documented all relevant documentation on the behalf of Penni Homans, MD,as directed by  Penni Homans, MD while in the presence of Penni Homans, MD.  I, [name], MD personally performed the services described in this documentation. All medical record entries made by the scribe were at my direction and in my presence. I have reviewed the chart and agree that the record reflects my personal performance and is accurate and complete

## 2021-03-04 NOTE — Assessment & Plan Note (Signed)
Well controlled, no changes to meds. Encouraged heart healthy diet such as the DASH diet and exercise as tolerated.  °

## 2021-03-04 NOTE — Assessment & Plan Note (Signed)
Encouraged heart healthy diet, increase exercise, avoid trans fats, consider a krill oil cap daily 

## 2021-03-04 NOTE — Assessment & Plan Note (Signed)
hgba1c acceptable, minimize simple carbs. Increase exercise as tolerated.  

## 2021-03-04 NOTE — Assessment & Plan Note (Signed)
Check lipids today, h/o myalgias with statins

## 2021-03-04 NOTE — Assessment & Plan Note (Signed)
Supplement and monitor. He is taking 1200 mg of Mag ox daily

## 2021-03-04 NOTE — Assessment & Plan Note (Signed)
He notes an increase in urinary frequency and even occasional dysuria since starting budesonide. Will check UA and culture and PSA today

## 2021-03-05 ENCOUNTER — Ambulatory Visit (INDEPENDENT_AMBULATORY_CARE_PROVIDER_SITE_OTHER): Payer: Medicare Other | Admitting: Gastroenterology

## 2021-03-05 ENCOUNTER — Encounter: Payer: Self-pay | Admitting: Gastroenterology

## 2021-03-05 VITALS — BP 142/78 | HR 55 | Ht 64.0 in | Wt 170.0 lb

## 2021-03-05 DIAGNOSIS — R197 Diarrhea, unspecified: Secondary | ICD-10-CM

## 2021-03-05 DIAGNOSIS — K52832 Lymphocytic colitis: Secondary | ICD-10-CM | POA: Diagnosis not present

## 2021-03-05 DIAGNOSIS — K219 Gastro-esophageal reflux disease without esophagitis: Secondary | ICD-10-CM

## 2021-03-05 LAB — LIPID PANEL
Cholesterol: 286 mg/dL — ABNORMAL HIGH (ref 0–200)
HDL: 47.1 mg/dL (ref >39.00)
LDL Cholesterol: 221 mg/dL — ABNORMAL HIGH (ref 0–99)
NonHDL: 238.6
Total CHOL/HDL Ratio: 6
Triglycerides: 90 mg/dL (ref 0.0–149.0)
VLDL: 18 mg/dL (ref 0.0–40.0)

## 2021-03-05 LAB — URINALYSIS
Bilirubin Urine: NEGATIVE
Hgb urine dipstick: NEGATIVE
Ketones, ur: NEGATIVE
Leukocytes,Ua: NEGATIVE
Nitrite: NEGATIVE
Specific Gravity, Urine: 1.015 (ref 1.000–1.030)
Total Protein, Urine: NEGATIVE
Urine Glucose: NEGATIVE
Urobilinogen, UA: 0.2 (ref 0.0–1.0)
pH: 6 (ref 5.0–8.0)

## 2021-03-05 LAB — COMPREHENSIVE METABOLIC PANEL
ALT: 8 U/L (ref 0–53)
AST: 12 U/L (ref 0–37)
Albumin: 4.2 g/dL (ref 3.5–5.2)
Alkaline Phosphatase: 68 U/L (ref 39–117)
BUN: 16 mg/dL (ref 6–23)
CO2: 27 mEq/L (ref 19–32)
Calcium: 9.7 mg/dL (ref 8.4–10.5)
Chloride: 102 mEq/L (ref 96–112)
Creatinine, Ser: 1.31 mg/dL (ref 0.40–1.50)
GFR: 53.76 mL/min — ABNORMAL LOW (ref >60.00)
Glucose, Bld: 93 mg/dL (ref 70–99)
Potassium: 4.3 mEq/L (ref 3.5–5.1)
Sodium: 139 mEq/L (ref 135–145)
Total Bilirubin: 0.8 mg/dL (ref 0.2–1.2)
Total Protein: 7 g/dL (ref 6.0–8.3)

## 2021-03-05 LAB — CBC
HCT: 39.7 % (ref 39.0–52.0)
Hemoglobin: 13.6 g/dL (ref 13.0–17.0)
MCHC: 34.2 g/dL (ref 30.0–36.0)
MCV: 89.6 fl (ref 78.0–100.0)
Platelets: 204 10*3/uL (ref 150.0–400.0)
RBC: 4.43 Mil/uL (ref 4.22–5.81)
RDW: 13.4 % (ref 11.5–15.5)
WBC: 9.3 10*3/uL (ref 4.0–10.5)

## 2021-03-05 LAB — HEMOGLOBIN A1C: Hgb A1c MFr Bld: 5.5 % (ref 4.6–6.5)

## 2021-03-05 LAB — URINE CULTURE
MICRO NUMBER:: 11950096
Result:: NO GROWTH
SPECIMEN QUALITY:: ADEQUATE

## 2021-03-05 LAB — PSA: PSA: 1.8 ng/mL (ref 0.10–4.00)

## 2021-03-05 LAB — VITAMIN D 25 HYDROXY (VIT D DEFICIENCY, FRACTURES): VITD: 44.66 ng/mL (ref 30.00–100.00)

## 2021-03-05 LAB — TSH: TSH: 2.38 u[IU]/mL (ref 0.35–4.50)

## 2021-03-05 LAB — MAGNESIUM: Magnesium: 1.5 mg/dL (ref 1.5–2.5)

## 2021-03-05 MED ORDER — DIPHENOXYLATE-ATROPINE 2.5-0.025 MG PO TABS: 1.0000 | ORAL_TABLET | Freq: Three times a day (TID) | ORAL | 0 refills | Status: DC

## 2021-03-05 MED ORDER — BUDESONIDE 3 MG PO CPEP: 3.0000 mg | ORAL_CAPSULE | Freq: Every day | ORAL | 0 refills | Status: DC

## 2021-03-05 MED ORDER — BUDESONIDE 3 MG PO CPEP: 3.0000 mg | ORAL_CAPSULE | Freq: Two times a day (BID) | ORAL | 0 refills | Status: DC

## 2021-03-05 MED ORDER — BUDESONIDE 3 MG PO CPEP: 3.0000 mg | ORAL_CAPSULE | Freq: Three times a day (TID) | ORAL | 0 refills | Status: AC

## 2021-03-05 NOTE — Progress Notes (Signed)
P  Chief Complaint:    Diarrhea, Lymphocytic Colitis  GI History: 75 y.o. male with a history of CAD, COPD, CHF, GERD, HTN, hyperlipidemia, nephrolithiasis, osteoporosis, atrial fibrillation, cardioversion 2019, cholecystectomy, initially seen in the GI clinic 12/22/2020 for evaluation of chronic diarrhea.  Nonbloody diarrhea started after hospitalization 04/2020, requiring 3 hospitalizations for exacerbations and electrolyte abnormalities, with evaluation as below:  -04/2020: Acute onset diarrhea with 6-day hospitalization for electrolyte abnormalities, fatigue -04/2020: CT A/P: Sigmoid diverticulosis without diverticulitis, gastric wall thickening -08/2020: Hospital admission for hypomagnesemia, hypokalemia, ongoing diarrhea.  Discharged with cholestyramine, Imodium, oral potassium and magnesium supplements -08/2020: Follow-up with Dr. Earlean Shawl.  Fecal calprotectin mildly elevated at 110. Pancreatic elastase very mildly reduced at 192.  Lactoferrin normal, FOBT-, C diff-, GI PCR panel-. Started on budesonide 9 mg/day, but only tolerated x2 weeks and discontinued. -Start Lomotil in 08/2020 with some improvement -09/2020: Follow-up with Dr. Earlean Shawl.  Was doing okay with Questran bid, but breakthrough recurrent loose stools when he tried to titrate down to 1 packet/day.  Recommended colonoscopy, but this was never scheduled -12/2020: Hospital admission for hypomagnesemia (0.8), dizziness.  Improved with IVF and mag supplementation - 12/17/2020: Initial appointment with me.  Some improvement with daily cholestyramine and Lomotil QPM. - 12/2020: Negative/normal Giardia, fecal elastase, CMP.  Mag 1.5 started on mag supplement. Was sent for SIBO testing- no results yet - 01/2021: EGD/colonoscopy as outlined below  Endoscopic History: -Colonoscopy (10/2017, Dr. Earlean Shawl): 8 mm splenic flexure polyp, 2 cm pedunculated sigmoid polyp, left-sided diverticulosis, large internal hemorrhoids.  Normal TI -EGD (08/2019,  Dr. Earlean Shawl): 2 cm HH, otherwise normal -Colonoscopy (08/2019, Dr. Earlean Shawl): 5 mm ascending polyp, left-sided diverticulosis, large internal hemorrhoids.  Normal TI - EGD (01/2021, Dr. Bryan Lemma): Fundic gland polyps, mild gastritis.  Normal duodenal biopsies - Colonoscopy (01/2021, Dr. Bryan Lemma): 2 mm benign sigmoid polyp, sigmoid diverticulosis, internal hemorrhoids.  Normal mucosa but biopsies n/f Lymphocytic Colitis.  Started on budesonide  HPI:     Patient is a 75 y.o. male presenting to the [facility]astroenterology Clinic for follow-up.  Initially seen by me in 12/2020 with EGD/colonoscopy completed in 01/2021 and notable for Lymphocytic Colitis.  He was prescribed budesonide 9 mg/day.  Today, he states he has had no change in sxs with the budesonide. States he felt better on the Lomotil.  However, due to insurance issues, was only started at 2 caps/day x14 days then 1 cap/day x14 days. Never took 9 mg x8 week dose.    Review of systems:     No chest pain, no SOB, no fevers, no urinary sx   Past Medical History:  Diagnosis Date  . A-fib ([place]) 03/22/2018  . Anemia   . CAD (coronary artery disease)    reportedly moderate CAD; managed medically  . Cataract    removed from both eyes  . CHF (congestive heart failure) ([place])   . COPD (chronic obstructive pulmonary disease) ([place])   . Eczema 08/03/2014  . GERD (gastroesophageal reflux disease)   . H/O: rheumatic fever   . Heart murmur   . High risk medication use    on amiodarone since 03/31/2012  . History of colonoscopy   . Hyperglycemia 06/21/2017  . Hyperlipidemia   . Hypertension   . Kidney stone 06/21/2017  . Lobar pneumonia ([place]) 03/22/2018  . Low vitamin D level 06/21/2017  . Macular degeneration   . Osteoporosis 05/31/2016    Patient's surgical history, family medical history, social history, medications and allergies were all reviewed  in Epic    Current Outpatient Medications  Medication Sig Dispense Refill  . amoxicillin (AMOXIL)  500 MG capsule SMARTSIG:4 Capsule(s) By Mouth Once    . brimonidine (ALPHAGAN) 0.2 % ophthalmic solution Place 1 drop into the left eye in the morning and at bedtime.   7  . [START ON 04/14/2021] budesonide (ENTOCORT EC) 3 MG 24 hr capsule Take 2 capsules (6 mg total) by mouth daily for 14 days, THEN 1 capsule (3 mg total) daily for 14 days. 42 capsule 0  . Cholecalciferol (VITAMIN D3) 5000 units CAPS Take 10,000 Units by mouth every other day.     . clobetasol (TEMOVATE) 0.05 % external solution APPLY 1 APPLICATION TOPICALLY TWICE DAILY (Patient taking differently: Apply 1 application topically 2 (two) times daily as needed (irritation). APPLY 1 APPLICATION TOPICALLY TWICE DAILY AS NEEDED) 100 mL 3  . ELIQUIS 5 MG TABS tablet TAKE 1 TABLET BY MOUTH  TWICE DAILY (Patient taking differently: Take 5 mg by mouth 2 (two) times daily.) 180 tablet 3  . fluconazole (DIFLUCAN) 100 MG tablet Take by mouth.    . furosemide (LASIX) 20 MG tablet Take 1 tablet (20 mg total) by mouth daily. 90 tablet 3  . hydrocortisone 2.5 % lotion Apply 1 application topically 2 (two) times daily.    Marland Kitchen latanoprost (XALATAN) 0.005 % ophthalmic solution Place 1 drop into the left eye at bedtime.     . levalbuterol (XOPENEX HFA) 45 MCG/ACT inhaler Inhale 2 puffs into the lungs every 4 (four) hours as needed for wheezing. 45 g 2  . magnesium oxide (MAG-OX) 400 (240 Mg) MG tablet TAKE 1 TABLET(400 MG) BY MOUTH THREE TIMES DAILY AS NEEDED 90 tablet 1  . Melatonin 3 MG CAPS 1 cap PO QD    . metoprolol tartrate (LOPRESSOR) 25 MG tablet TAKE 1 TABLET BY MOUTH  TWICE DAILY (Patient taking differently: Take 25 mg by mouth 2 (two) times daily.) 180 tablet 3  . Multiple Vitamins-Minerals (ICAPS MV) TABS Take 1 tablet by mouth 2 (two) times daily.    . nitroGLYCERIN (NITROSTAT) 0.4 MG SL tablet Place 1 tablet (0.4 mg total) under the tongue every 5 (five) minutes as needed for chest pain. 30 tablet 0  . omeprazole (PRILOSEC OTC) 20 MG tablet  Take 20 mg by mouth daily.    . potassium chloride (KLOR-CON) 10 MEQ tablet Take 1 tablet (10 mEq total) by mouth daily. 90 tablet 2  . propafenone (RYTHMOL) 225 MG tablet TAKE 1 TABLET BY MOUTH  TWICE DAILY 180 tablet 2  . Ranibizumab (LUCENTIS IO) Inject 1 Dose into the eye as needed (Macular degeneration).     . tamsulosin (FLOMAX) 0.4 MG CAPS capsule Take 0.4 mg by mouth every evening.     No current facility-administered medications for this visit.    Physical Exam:     BP (!) 142/78   Pulse (!) 55   Ht 5\' 4"  (1.626 m)   Wt 170 lb (77.1 kg)   SpO2 97%   BMI 29.18 kg/m   GENERAL:  Pleasant male in NAD PSYCH: : Cooperative, normal affect Musculoskeletal:  Normal muscle tone, normal strength NEURO: Alert and oriented x 3, no focal neurologic deficits   IMPRESSION and PLAN:    1) Lymphocytic Colitis 2) Chronic diarrhea  - Will again prescribe budesonide 9 mg/day x8 weeks then taper to 6 mg/day x2 weeks, 3 mg/day x2 weeks, then discontinue - If symptoms persist or recur during tapering, continue budesonide  9 mg x 12 weeks then slowly taper. - Okay to use Lomotil as needed during budesonide course - Discussed pathophysiology of microscopic colitis at length today.  Medications reviewed for potential offending agents, which certainly includes his chronic Prilosec.  However, he had no response to previous trials of H2RA, so holding off on changing reflux medication at this time.  Otherwise is a non-smoker and denies any NSAIDs and not taking statins, SSRIs, etc. - Placed call to [facility] to obtain copy of SIBO results - If truly not responsive to budesonide at therapeutic dose, can trial scheduled loperamide/cholestyramine and very rarely, need to consider immunosuppressive therapy  3) GERD - Reflux well controlled with Prilosec 20 mg/day - Reviewed medications with respect to Lymphocytic Colitis as above - Continue antireflux lifestyle/dietary modifications  I spent 30  minutes of time, including in depth chart review, independent review of results as outlined above, communicating results with the patient directly, face-to-face time with the patient, coordinating care, and ordering studies and medications as appropriate, and documentation.      Lavena Bullion ,DO, FACG 03/05/2021, 8:50 AM

## 2021-03-05 NOTE — Patient Instructions (Addendum)
If you are age 74 or older, your body mass index should be between 23-30. Your Body mass index is 29.18 kg/m. If this is out of the aforementioned range listed, please consider follow up with your Primary Care Provider.  If you are age 72 or younger, your body mass index should be between 19-25. Your Body mass index is 29.18 kg/m. If this is out of the aformentioned range listed, please consider follow up with your Primary Care Provider.   We have sent the following medications to your pharmacy for you to pick up at your convenience:  Budesonide 3 mg- three different prescriptions Lomotil 2.5  Due to recent changes in healthcare laws, you may see the results of your imaging and laboratory studies on MyChart before your provider has had a chance to review them.  We understand that in some cases there may be results that are confusing or concerning to you. Not all laboratory results come back in the same time frame and the provider may be waiting for multiple results in order to interpret others.  Please give [place] 48 hours in order for your provider to thoroughly review all the results before contacting the office for clarification of your results.   Thank you for choosing me and [facility].  Vito Cirigliano, D.O.

## 2021-03-06 NOTE — Progress Notes (Addendum)
Error

## 2021-03-12 ENCOUNTER — Other Ambulatory Visit: Payer: Self-pay | Admitting: Family Medicine

## 2021-03-12 DIAGNOSIS — M5416 Radiculopathy, lumbar region: Secondary | ICD-10-CM | POA: Diagnosis not present

## 2021-03-12 DIAGNOSIS — M48062 Spinal stenosis, lumbar region with neurogenic claudication: Secondary | ICD-10-CM | POA: Diagnosis not present

## 2021-03-13 ENCOUNTER — Other Ambulatory Visit: Payer: Self-pay | Admitting: *Deleted

## 2021-03-13 DIAGNOSIS — R361 Hematospermia: Secondary | ICD-10-CM | POA: Diagnosis not present

## 2021-03-13 DIAGNOSIS — R3916 Straining to void: Secondary | ICD-10-CM | POA: Diagnosis not present

## 2021-03-13 DIAGNOSIS — E782 Mixed hyperlipidemia: Secondary | ICD-10-CM

## 2021-03-13 DIAGNOSIS — N401 Enlarged prostate with lower urinary tract symptoms: Secondary | ICD-10-CM | POA: Diagnosis not present

## 2021-03-13 MED ORDER — CHOLESTYRAMINE 4 G PO PACK: 4.0000 g | PACK | Freq: Two times a day (BID) | ORAL | 1 refills | Status: DC

## 2021-03-23 ENCOUNTER — Emergency Department (HOSPITAL_COMMUNITY): Payer: Medicare Other

## 2021-03-23 ENCOUNTER — Inpatient Hospital Stay (HOSPITAL_COMMUNITY)
Admission: EM | Admit: 2021-03-23 | Discharge: 2021-03-25 | DRG: 552 | Disposition: A | Discharge status: Home / Self Care | Payer: Medicare Other | Attending: Family Medicine | Admitting: Family Medicine

## 2021-03-23 ENCOUNTER — Observation Stay (HOSPITAL_COMMUNITY): Payer: Medicare Other

## 2021-03-23 ENCOUNTER — Other Ambulatory Visit: Payer: Self-pay | Admitting: Internal Medicine

## 2021-03-23 DIAGNOSIS — I639 Cerebral infarction, unspecified: Secondary | ICD-10-CM | POA: Diagnosis not present

## 2021-03-23 DIAGNOSIS — I6501 Occlusion and stenosis of right vertebral artery: Secondary | ICD-10-CM | POA: Diagnosis not present

## 2021-03-23 DIAGNOSIS — J449 Chronic obstructive pulmonary disease, unspecified: Secondary | ICD-10-CM | POA: Diagnosis not present

## 2021-03-23 DIAGNOSIS — E559 Vitamin D deficiency, unspecified: Secondary | ICD-10-CM | POA: Diagnosis present

## 2021-03-23 DIAGNOSIS — G319 Degenerative disease of nervous system, unspecified: Secondary | ICD-10-CM | POA: Diagnosis not present

## 2021-03-23 DIAGNOSIS — I2510 Atherosclerotic heart disease of native coronary artery without angina pectoris: Secondary | ICD-10-CM | POA: Diagnosis present

## 2021-03-23 DIAGNOSIS — Z885 Allergy status to narcotic agent status: Secondary | ICD-10-CM

## 2021-03-23 DIAGNOSIS — I130 Hypertensive heart and chronic kidney disease with heart failure and stage 1 through stage 4 chronic kidney disease, or unspecified chronic kidney disease: Secondary | ICD-10-CM | POA: Diagnosis present

## 2021-03-23 DIAGNOSIS — Z825 Family history of asthma and other chronic lower respiratory diseases: Secondary | ICD-10-CM

## 2021-03-23 DIAGNOSIS — I10 Essential (primary) hypertension: Secondary | ICD-10-CM | POA: Diagnosis not present

## 2021-03-23 DIAGNOSIS — Z7901 Long term (current) use of anticoagulants: Secondary | ICD-10-CM

## 2021-03-23 DIAGNOSIS — Z888 Allergy status to other drugs, medicaments and biological substances status: Secondary | ICD-10-CM

## 2021-03-23 DIAGNOSIS — I480 Paroxysmal atrial fibrillation: Secondary | ICD-10-CM | POA: Diagnosis present

## 2021-03-23 DIAGNOSIS — N400 Enlarged prostate without lower urinary tract symptoms: Secondary | ICD-10-CM | POA: Diagnosis present

## 2021-03-23 DIAGNOSIS — Z8619 Personal history of other infectious and parasitic diseases: Secondary | ICD-10-CM

## 2021-03-23 DIAGNOSIS — Z833 Family history of diabetes mellitus: Secondary | ICD-10-CM

## 2021-03-23 DIAGNOSIS — E785 Hyperlipidemia, unspecified: Secondary | ICD-10-CM | POA: Diagnosis present

## 2021-03-23 DIAGNOSIS — E669 Obesity, unspecified: Secondary | ICD-10-CM | POA: Diagnosis present

## 2021-03-23 DIAGNOSIS — R531 Weakness: Secondary | ICD-10-CM | POA: Diagnosis present

## 2021-03-23 DIAGNOSIS — Z87891 Personal history of nicotine dependence: Secondary | ICD-10-CM

## 2021-03-23 DIAGNOSIS — M5416 Radiculopathy, lumbar region: Secondary | ICD-10-CM | POA: Diagnosis not present

## 2021-03-23 DIAGNOSIS — Z79899 Other long term (current) drug therapy: Secondary | ICD-10-CM

## 2021-03-23 DIAGNOSIS — I679 Cerebrovascular disease, unspecified: Secondary | ICD-10-CM

## 2021-03-23 DIAGNOSIS — N182 Chronic kidney disease, stage 2 (mild): Secondary | ICD-10-CM | POA: Diagnosis present

## 2021-03-23 DIAGNOSIS — Z6828 Body mass index (BMI) 28.0-28.9, adult: Secondary | ICD-10-CM

## 2021-03-23 DIAGNOSIS — Z8261 Family history of arthritis: Secondary | ICD-10-CM

## 2021-03-23 DIAGNOSIS — I5032 Chronic diastolic (congestive) heart failure: Secondary | ICD-10-CM | POA: Diagnosis present

## 2021-03-23 DIAGNOSIS — Z20822 Contact with and (suspected) exposure to covid-19: Secondary | ICD-10-CM | POA: Diagnosis present

## 2021-03-23 DIAGNOSIS — Z8673 Personal history of transient ischemic attack (TIA), and cerebral infarction without residual deficits: Secondary | ICD-10-CM

## 2021-03-23 DIAGNOSIS — K52832 Lymphocytic colitis: Secondary | ICD-10-CM | POA: Diagnosis present

## 2021-03-23 DIAGNOSIS — I6523 Occlusion and stenosis of bilateral carotid arteries: Secondary | ICD-10-CM | POA: Diagnosis not present

## 2021-03-23 DIAGNOSIS — Z83438 Family history of other disorder of lipoprotein metabolism and other lipidemia: Secondary | ICD-10-CM

## 2021-03-23 DIAGNOSIS — R2990 Unspecified symptoms and signs involving the nervous system: Secondary | ICD-10-CM | POA: Insufficient documentation

## 2021-03-23 DIAGNOSIS — M810 Age-related osteoporosis without current pathological fracture: Secondary | ICD-10-CM | POA: Diagnosis present

## 2021-03-23 DIAGNOSIS — Z8249 Family history of ischemic heart disease and other diseases of the circulatory system: Secondary | ICD-10-CM

## 2021-03-23 DIAGNOSIS — K219 Gastro-esophageal reflux disease without esophagitis: Secondary | ICD-10-CM | POA: Diagnosis present

## 2021-03-23 DIAGNOSIS — I672 Cerebral atherosclerosis: Secondary | ICD-10-CM | POA: Diagnosis not present

## 2021-03-23 HISTORY — DX: Personal history of transient ischemic attack (TIA), and cerebral infarction without residual deficits: Z86.73

## 2021-03-23 HISTORY — DX: Cerebrovascular disease, unspecified: I67.9

## 2021-03-23 LAB — I-STAT CHEM 8, ED
BUN: 28 mg/dL — ABNORMAL HIGH (ref 8–23)
Calcium, Ion: 1.17 mmol/L (ref 1.15–1.40)
Chloride: 105 mmol/L (ref 98–111)
Creatinine, Ser: 1.4 mg/dL — ABNORMAL HIGH (ref 0.61–1.24)
Glucose, Bld: 96 mg/dL (ref 70–99)
HCT: 41 % (ref 39.0–52.0)
Hemoglobin: 13.9 g/dL (ref 13.0–17.0)
Potassium: 4.7 mmol/L (ref 3.5–5.1)
Sodium: 138 mmol/L (ref 135–145)
TCO2: 24 mmol/L (ref 22–32)

## 2021-03-23 LAB — COMPREHENSIVE METABOLIC PANEL
ALT: 13 U/L (ref 0–44)
AST: 19 U/L (ref 15–41)
Albumin: 3.6 g/dL (ref 3.5–5.0)
Alkaline Phosphatase: 60 U/L (ref 38–126)
Anion gap: 7 (ref 5–15)
BUN: 27 mg/dL — ABNORMAL HIGH (ref 8–23)
CO2: 27 mmol/L (ref 22–32)
Calcium: 9.4 mg/dL (ref 8.9–10.3)
Chloride: 102 mmol/L (ref 98–111)
Creatinine, Ser: 1.41 mg/dL — ABNORMAL HIGH (ref 0.61–1.24)
GFR, Estimated: 52 mL/min — ABNORMAL LOW (ref >60)
Glucose, Bld: 100 mg/dL — ABNORMAL HIGH (ref 70–99)
Potassium: 4.8 mmol/L (ref 3.5–5.1)
Sodium: 136 mmol/L (ref 135–145)
Total Bilirubin: 0.9 mg/dL (ref 0.3–1.2)
Total Protein: 7 g/dL (ref 6.5–8.1)

## 2021-03-23 LAB — CBC
HCT: 43 % (ref 39.0–52.0)
Hemoglobin: 14.3 g/dL (ref 13.0–17.0)
MCH: 30.8 pg (ref 26.0–34.0)
MCHC: 33.3 g/dL (ref 30.0–36.0)
MCV: 92.7 fL (ref 80.0–100.0)
Platelets: 195 10*3/uL (ref 150–400)
RBC: 4.64 MIL/uL (ref 4.22–5.81)
RDW: 13.1 % (ref 11.5–15.5)
WBC: 11.5 10*3/uL — ABNORMAL HIGH (ref 4.0–10.5)
nRBC: 0 % (ref 0.0–0.2)

## 2021-03-23 LAB — URINALYSIS, ROUTINE W REFLEX MICROSCOPIC
Bilirubin Urine: NEGATIVE
Glucose, UA: NEGATIVE mg/dL
Hgb urine dipstick: NEGATIVE
Ketones, ur: NEGATIVE mg/dL
Leukocytes,Ua: NEGATIVE
Nitrite: NEGATIVE
Protein, ur: NEGATIVE mg/dL
Specific Gravity, Urine: 1.015 (ref 1.005–1.030)
pH: 6 (ref 5.0–8.0)

## 2021-03-23 LAB — DIFFERENTIAL
Abs Immature Granulocytes: 0.11 10*3/uL — ABNORMAL HIGH (ref 0.00–0.07)
Basophils Absolute: 0.1 10*3/uL (ref 0.0–0.1)
Basophils Relative: 0 %
Eosinophils Absolute: 0.1 10*3/uL (ref 0.0–0.5)
Eosinophils Relative: 1 %
Immature Granulocytes: 1 %
Lymphocytes Relative: 24 %
Lymphs Abs: 2.7 10*3/uL (ref 0.7–4.0)
Monocytes Absolute: 0.9 10*3/uL (ref 0.1–1.0)
Monocytes Relative: 8 %
Neutro Abs: 7.6 10*3/uL (ref 1.7–7.7)
Neutrophils Relative %: 66 %

## 2021-03-23 LAB — RAPID URINE DRUG SCREEN, HOSP PERFORMED
Amphetamines: NOT DETECTED
Barbiturates: NOT DETECTED
Benzodiazepines: NOT DETECTED
Cocaine: NOT DETECTED
Opiates: NOT DETECTED
Tetrahydrocannabinol: NOT DETECTED

## 2021-03-23 LAB — ETHANOL: Alcohol, Ethyl (B): <10 mg/dL (ref <10)

## 2021-03-23 LAB — PROTIME-INR
INR: 1.3 — ABNORMAL HIGH (ref 0.8–1.2)
Prothrombin Time: 15.9 seconds — ABNORMAL HIGH (ref 11.4–15.2)

## 2021-03-23 LAB — APTT: aPTT: 25 seconds (ref 24–36)

## 2021-03-23 LAB — CBG MONITORING, ED: Glucose-Capillary: 93 mg/dL (ref 70–99)

## 2021-03-23 LAB — RESP PANEL BY RT-PCR (FLU A&B, COVID) ARPGX2
Influenza A by PCR: NEGATIVE
Influenza B by PCR: NEGATIVE
SARS Coronavirus 2 by RT PCR: NEGATIVE

## 2021-03-23 MED ORDER — ACETAMINOPHEN 650 MG RE SUPP: 650.0000 mg | RECTAL | Status: DC | PRN

## 2021-03-23 MED ORDER — BUDESONIDE 3 MG PO CPEP
9.0000 mg | ORAL_CAPSULE | Freq: Every day | ORAL | Status: DC
  Administered 2021-03-24 – 2021-03-25 (×2): 9 mg via ORAL
  Filled 2021-03-23 (×2): qty 3

## 2021-03-23 MED ORDER — BRIMONIDINE TARTRATE 0.2 % OP SOLN
1.0000 [drp] | Freq: Three times a day (TID) | OPHTHALMIC | Status: DC
  Administered 2021-03-24 – 2021-03-25 (×5): 1 [drp] via OPHTHALMIC
  Filled 2021-03-23 (×2): qty 5

## 2021-03-23 MED ORDER — HYDROCORTISONE 2.5 % EX LOTN: 1.0000 "application " | TOPICAL_LOTION | Freq: Two times a day (BID) | CUTANEOUS | Status: DC

## 2021-03-23 MED ORDER — STROKE: EARLY STAGES OF RECOVERY BOOK: Freq: Once | Status: DC

## 2021-03-23 MED ORDER — BUDESONIDE 3 MG PO CPEP: 3.0000 mg | ORAL_CAPSULE | Freq: Three times a day (TID) | ORAL | Status: DC

## 2021-03-23 MED ORDER — LORAZEPAM 0.5 MG PO TABS: 0.5000 mg | ORAL_TABLET | Freq: Four times a day (QID) | ORAL | Status: DC | PRN

## 2021-03-23 MED ORDER — FUROSEMIDE 20 MG PO TABS: 20.0000 mg | ORAL_TABLET | Freq: Every day | ORAL | Status: DC

## 2021-03-23 MED ORDER — MELATONIN 3 MG PO TABS
3.0000 mg | ORAL_TABLET | Freq: Every day | ORAL | Status: DC
  Administered 2021-03-23 – 2021-03-24 (×2): 3 mg via ORAL
  Filled 2021-03-23 (×2): qty 1

## 2021-03-23 MED ORDER — APIXABAN 5 MG PO TABS
5.0000 mg | ORAL_TABLET | Freq: Two times a day (BID) | ORAL | Status: DC
  Administered 2021-03-23 – 2021-03-25 (×4): 5 mg via ORAL
  Filled 2021-03-23 (×4): qty 1

## 2021-03-23 MED ORDER — PROPAFENONE HCL 225 MG PO TABS
225.0000 mg | ORAL_TABLET | Freq: Two times a day (BID) | ORAL | Status: DC
  Administered 2021-03-23 – 2021-03-25 (×4): 225 mg via ORAL
  Filled 2021-03-23 (×5): qty 1

## 2021-03-23 MED ORDER — CHOLESTYRAMINE 4 G PO PACK: 4.0000 g | PACK | Freq: Two times a day (BID) | ORAL | Status: DC

## 2021-03-23 MED ORDER — DIPHENOXYLATE-ATROPINE 2.5-0.025 MG PO TABS
1.0000 | ORAL_TABLET | Freq: Four times a day (QID) | ORAL | Status: DC | PRN
  Filled 2021-03-23: qty 1

## 2021-03-23 MED ORDER — ACETAMINOPHEN 325 MG PO TABS
650.0000 mg | ORAL_TABLET | ORAL | Status: DC | PRN
Start: 2021-03-23 — End: 2021-03-25

## 2021-03-23 MED ORDER — METOPROLOL TARTRATE 25 MG PO TABS
25.0000 mg | ORAL_TABLET | Freq: Two times a day (BID) | ORAL | Status: DC
  Administered 2021-03-23 – 2021-03-25 (×3): 25 mg via ORAL
  Filled 2021-03-23 (×4): qty 1

## 2021-03-23 MED ORDER — LATANOPROST 0.005 % OP SOLN
1.0000 [drp] | Freq: Every day | OPHTHALMIC | Status: DC
  Administered 2021-03-24: 1 [drp] via OPHTHALMIC
  Filled 2021-03-23: qty 2.5

## 2021-03-23 MED ORDER — TAMSULOSIN HCL 0.4 MG PO CAPS
0.4000 mg | ORAL_CAPSULE | Freq: Every evening | ORAL | Status: DC
  Administered 2021-03-23 – 2021-03-24 (×2): 0.4 mg via ORAL
  Filled 2021-03-23 (×2): qty 1

## 2021-03-23 MED ORDER — PANTOPRAZOLE SODIUM 20 MG PO TBEC
20.0000 mg | DELAYED_RELEASE_TABLET | Freq: Every day | ORAL | Status: DC
  Administered 2021-03-24 – 2021-03-25 (×2): 20 mg via ORAL
  Filled 2021-03-23 (×3): qty 1

## 2021-03-23 MED ORDER — LEVALBUTEROL TARTRATE 45 MCG/ACT IN AERO
2.0000 | INHALATION_SPRAY | RESPIRATORY_TRACT | Status: DC | PRN
  Filled 2021-03-23: qty 15

## 2021-03-23 MED ORDER — ACETAMINOPHEN 160 MG/5ML PO SOLN
650.0000 mg | ORAL | Status: DC | PRN
Start: 2021-03-23 — End: 2021-03-25

## 2021-03-23 MED ORDER — CLOBETASOL PROPIONATE 0.05 % EX SOLN: 1.0000 "application " | Freq: Two times a day (BID) | CUTANEOUS | Status: DC | PRN

## 2021-03-23 NOTE — ED Provider Notes (Signed)
Emergency Medicine Provider Triage Evaluation Note  Ryan [name] , a 75 y.o. male  was evaluated in triage.  Pt complains of sudden onset left lower extremity weakness/numbness that began around 12 today. He was working in his workshop when he states his leg stopped working. No other complaints at this time. Pt's wife made him come to the ED for further evaluation. He does mention hx of epidural injections into his back due to issues with his right leg however never has issues with his left leg. NO back pain currently. NO saddle anesthesia, urinary retention, urinary or bowel incontinence. .  Review of Systems  Positive: + left leg weakness Negative: - fevers, chills, back pain  Physical Exam  BP (!) 189/78 (BP Location: Right Arm)   Pulse (!) 57   Temp 98 F (36.7 C) (Oral)   Resp 16   SpO2 96%  Gen:   Awake, no distress   Resp:  Normal effort  MSK:   Moves extremities without difficulty  Other:  Strength diminished to LLE with diminished sensation. Decreased sensation to left side of face as well. Remainder of neuro exam intact. NO droop noted. NO arm drift.   Medical Decision Making  Medically screening exam initiated at 1:50 PM.  Appropriate orders placed.  [name] was informed that the remainder of the evaluation will be completed by another provider, this initial triage assessment does not replace that evaluation, and the importance of remaining in the ED until their evaluation is complete.  Code stroke called.    Eustaquio Maize, PA-C 03/23/21 1352    Arnaldo Natal, MD 03/23/21 [id number]

## 2021-03-23 NOTE — Consult Note (Signed)
Referring Physician: Dr. Tomi Bamberger    Chief Complaint: Acute onset of LLE weakness  HPI: Ryan [name] is a 75 y.o. male with a PMHx of atrial fibrillation (on Eliquis), CAD, CHF, rheumatic fever as a child, heart murmur, HLD and HTN, who presents to the ED after experiencing acute onset of LLE weakness. Time of symptom onset was 1200. He had been bicycling and afterwards was in his workshop when he experienced sudden loss of movement of his left leg.   He has no other symptoms. He has been receiving epidural injections for issues with his right leg; however, he has not previously had any difficulty with his left leg. He denied any back pain while in CT. Has no incontinence, saddle anesthesia or urinary retention.   LSN: Noon tPA Given: No: On anticoagulation  Past Medical History:  Diagnosis Date   A-fib ([place]) 03/22/2018   Anemia    CAD (coronary artery disease)    reportedly moderate CAD; managed medically   Cataract    removed from both eyes   CHF (congestive heart failure) ([place])    COPD (chronic obstructive pulmonary disease) ([place])    Eczema 08/03/2014   GERD (gastroesophageal reflux disease)    H/O: rheumatic fever    Heart murmur    High risk medication use    on amiodarone since 03/31/2012   History of colonoscopy    Hyperglycemia 06/21/2017   Hyperlipidemia    Hypertension    Kidney stone 06/21/2017   Lobar pneumonia ([place]) 03/22/2018   Low vitamin D level 06/21/2017   Macular degeneration    Osteoporosis 05/31/2016    Past Surgical History:  Procedure Laterality Date   APPENDECTOMY     CARDIAC CATHETERIZATION  04/22/2011   moderate left main and RCA stenosis not significant by FFR and IVUS on medical therapy   CARDIOVERSION N/A 04/25/2018   Procedure: CARDIOVERSION;  Surgeon: Skeet Latch, MD;  Location: [facility];  Service: Cardiovascular;  Laterality: N/A;   CERVICAL DISCECTOMY     C5,6,7 disc fused  with plate and 5 1" screws   CHOLECYSTECTOMY     colon polyp  removal  3/1.19   COLONOSCOPY     FOOT SURGERY     right calcification removed from top of foot   knee cartiledge Right    right knee   MOUTH SURGERY     periodontal surgery, bridges, splint in front,    UPPER GASTROINTESTINAL ENDOSCOPY      Family History  Problem Relation Age of Onset   Heart disease Mother    Diabetes Mother    Cirrhosis Mother    Emphysema Mother        never smoked but 2nd hand through her spouse   Hypertension Mother    Macular degeneration Mother    Heart disease Father    Cancer Father        prostate   Hyperlipidemia Father    Hypertension Father    Varicose Veins Father    Heart attack Father    Peripheral vascular disease Father    Heart disease Sister    Arthritis Sister    Hyperlipidemia Sister    Obesity Sister    Macular degeneration Sister    Heart disease Brother        5 stents   Hyperlipidemia Brother    Macular degeneration Maternal Grandfather    Cirrhosis Sister    Obesity Sister    Arthritis Sister    Heart disease Sister  Obesity Sister    Liver disease Other    Prostate cancer Other    Coronary artery disease Other    Colon cancer Neg Hx    Esophageal cancer Neg Hx    Rectal cancer Neg Hx    Stomach cancer Neg Hx    Social History:  reports that he quit smoking about 11 years ago. His smoking use included cigarettes. He has a 25.00 pack-year smoking history. He has quit using smokeless tobacco.  His smokeless tobacco use included chew. He reports current alcohol use of about 1.0 standard drink of alcohol per week. He reports that he does not use drugs.  Allergies:  Allergies  Allergen Reactions   Albumin (Human) Anaphylaxis   Polymyxin B-Trimethoprim Swelling    Eye drops made eyes swell   Pseudoephedrine Other (See Comments)    Stomach cramps   Codeine Hives, Itching and Rash   Guaiacol Other (See Comments)    Hallucinations   Statins Other (See Comments)    Muscle cramps    Gabapentin Other (See Comments)    Meloxicam Other (See Comments)    Unknown reaction   Pseudoephedrine-Guaifenesin Nausea And Vomiting    Stomach cramps   Rosuvastatin Calcium Other (See Comments)    Muscle aches   Tapentadol Other (See Comments)    Unknown reaction   Ciprofloxacin Hives, Itching, Nausea Only and Rash   Moxifloxacin Nausea Only and Other (See Comments)    Headaches, stomach cramps    Rofecoxib Other (See Comments)    Stomach cramping    Home Medications:  No current facility-administered medications on file prior to encounter.   Current Outpatient Medications on File Prior to Encounter  Medication Sig Dispense Refill   amoxicillin (AMOXIL) 500 MG capsule SMARTSIG:4 Capsule(s) By Mouth Once     brimonidine (ALPHAGAN) 0.2 % ophthalmic solution Place 1 drop into the left eye in the morning and at bedtime.   7   budesonide (ENTOCORT EC) 3 MG 24 hr capsule Take 1 capsule (3 mg total) by mouth in the morning, at noon, and at bedtime. 180 capsule 0   [START ON 05/05/2021] budesonide (ENTOCORT EC) 3 MG 24 hr capsule Take 1 capsule (3 mg total) by mouth 2 (two) times daily for 14 days. 28 capsule 0   [START ON 05/19/2021] budesonide (ENTOCORT EC) 3 MG 24 hr capsule Take 1 capsule (3 mg total) by mouth daily for 14 doses. 14 capsule 0   Cholecalciferol (VITAMIN D3) 5000 units CAPS Take 10,000 Units by mouth every other day.      cholestyramine (QUESTRAN) 4 g packet Take 1 packet (4 g total) by mouth 2 (two) times daily. 180 packet 1   clobetasol (TEMOVATE) 0.05 % external solution APPLY 1 APPLICATION TOPICALLY TWICE DAILY (Patient taking differently: Apply 1 application topically 2 (two) times daily as needed (irritation). APPLY 1 APPLICATION TOPICALLY TWICE DAILY AS NEEDED) 100 mL 3   diphenoxylate-atropine (LOMOTIL) 2.5-0.025 MG tablet Take 1 tablet by mouth in the morning, at noon, and at bedtime. Take 2 tablets in the morning then 1 as needed every 6 hours for diarrhea 90 tablet 0   ELIQUIS 5 MG TABS tablet  TAKE 1 TABLET BY MOUTH  TWICE DAILY (Patient taking differently: Take 5 mg by mouth 2 (two) times daily.) 180 tablet 3   fluconazole (DIFLUCAN) 100 MG tablet TAKE 1 TABLET BY MOUTH DAILY FOR 3 DAYS. MAY REPEAT IN 1 WEEK 6 tablet 1   furosemide (LASIX) 20 MG tablet  Take 1 tablet (20 mg total) by mouth daily. 90 tablet 3   hydrocortisone 2.5 % lotion Apply 1 application topically 2 (two) times daily.     latanoprost (XALATAN) 0.005 % ophthalmic solution Place 1 drop into the left eye at bedtime.      levalbuterol (XOPENEX HFA) 45 MCG/ACT inhaler Inhale 2 puffs into the lungs every 4 (four) hours as needed for wheezing. 45 g 2   magnesium oxide (MAG-OX) 400 (240 Mg) MG tablet TAKE 1 TABLET(400 MG) BY MOUTH THREE TIMES DAILY AS NEEDED 90 tablet 1   Melatonin 3 MG CAPS 1 cap PO QD     metoprolol tartrate (LOPRESSOR) 25 MG tablet TAKE 1 TABLET BY MOUTH  TWICE DAILY (Patient taking differently: Take 25 mg by mouth 2 (two) times daily.) 180 tablet 3   Multiple Vitamins-Minerals (ICAPS MV) TABS Take 1 tablet by mouth 2 (two) times daily.     nitroGLYCERIN (NITROSTAT) 0.4 MG SL tablet Place 1 tablet (0.4 mg total) under the tongue every 5 (five) minutes as needed for chest pain. 30 tablet 0   omeprazole (PRILOSEC OTC) 20 MG tablet Take 20 mg by mouth daily.     potassium chloride (KLOR-CON) 10 MEQ tablet Take 1 tablet (10 mEq total) by mouth daily. 90 tablet 2   propafenone (RYTHMOL) 225 MG tablet TAKE 1 TABLET BY MOUTH  TWICE DAILY 180 tablet 2   Ranibizumab (LUCENTIS IO) Inject 1 Dose into the eye as needed (Macular degeneration).      tamsulosin (FLOMAX) 0.4 MG CAPS capsule Take 0.4 mg by mouth every evening.       ROS: As per HPI. Denies any additional complaints.    Physical Examination: Blood pressure (!) 163/74, pulse 70, temperature 98 F (36.7 C), temperature source Oral, resp. rate 18, weight 75.4 kg, SpO2 100 %.  HEENT: [place]/AT Lungs: Respirations unlabored Ext: No edema  Neurologic  Examination: Mental Status: Awake and alert. Fully oriented. Speech is fluent with intact comprehension and naming. No dysarthria. At times becomes emotional during portions of the motor exam, in conjunction with giveway weakness and inconsistencies (could not lift LLE with own power when specifically tested, but then lifted it with what appeared to be normal strength and coordination when transferring from CT table to stretcher, and also with testing of H-S on coordination exam.   Cranial Nerves: II:  Visual fields intact with no extinction to DSS. PERRL.  III,IV, VI: No ptosis. EOMI.   V,VII: Smile symmetric, facial temp sensation equal bilaterally VIII: hearing intact to voice IX,X: No hypophonia XI: Symmetric XII: Midline tongue extension  Motor: RUE and RLE: 5/5 LUE 5/5 LLE with inconsistent effort and giveway weakness, as well as significant variations in strength with different tasks that also is modifiable with coaching. Best strength against resistance is 4/5 HF, 5/5 KE, 4+/5 KF, 4+/5 ADF and 5/5 APF. See Mental Status above for additional description of inconsistencies.  Sensory: Temp and light touch intact throughout, bilaterally. No extinction to DSS.  Deep Tendon Reflexes:  2+ bilateral brachioradialis, biceps and patellae Plantars: Right: downgoing  Left: downgoing Cerebellar: No ataxia with FNF and H-S bilaterally. Left  H-S was accomplished by examiner placing LLE on RLE and sliding heel up and down right leg, then letting go, after which patient continued with the maneuver several times.  Gait: Deferred    Results for orders placed or performed during the hospital encounter of 03/23/21 (from the past 48 hour(s))  Protime-INR  Status: Abnormal   Collection Time: 03/23/21  1:51 PM  Result Value Ref Range   Prothrombin Time 15.9 (H) 11.4 - 15.2 seconds   INR 1.3 (H) 0.8 - 1.2    Comment: (NOTE) INR goal varies based on device and disease states. Performed at [facility], [place] [address], [place], [place] [address]   APTT     Status: None   Collection Time: 03/23/21  1:51 PM  Result Value Ref Range   aPTT 25 24 - 36 seconds    Comment: Performed at [facility] [address] [place], [place] [address]  CBC     Status: Abnormal   Collection Time: 03/23/21  1:51 PM  Result Value Ref Range   WBC 11.5 (H) 4.0 - 10.5 K/uL   RBC 4.64 4.22 - 5.81 MIL/uL   Hemoglobin 14.3 13.0 - 17.0 g/dL   HCT 43.0 39.0 - 52.0 %   MCV 92.7 80.0 - 100.0 fL   MCH 30.8 26.0 - 34.0 pg   MCHC 33.3 30.0 - 36.0 g/dL   RDW 13.1 11.5 - 15.5 %   Platelets 195 150 - 400 K/uL   nRBC 0.0 0.0 - 0.2 %    Comment: Performed at [facility], [place] [address], [place], [place] [address]  Differential     Status: Abnormal   Collection Time: 03/23/21  1:51 PM  Result Value Ref Range   Neutrophils Relative % 66 %   Neutro Abs 7.6 1.7 - 7.7 K/uL   Lymphocytes Relative 24 %   Lymphs Abs 2.7 0.7 - 4.0 K/uL   Monocytes Relative 8 %   Monocytes Absolute 0.9 0.1 - 1.0 K/uL   Eosinophils Relative 1 %   Eosinophils Absolute 0.1 0.0 - 0.5 K/uL   Basophils Relative 0 %   Basophils Absolute 0.1 0.0 - 0.1 K/uL   Immature Granulocytes 1 %   Abs Immature Granulocytes 0.11 (H) 0.00 - 0.07 K/uL    Comment: Performed at [facility] [address] [place], [place] [address]  I-stat chem 8, ED     Status: Abnormal   Collection Time: 03/23/21  2:01 PM  Result Value Ref Range   Sodium 138 135 - 145 mmol/L   Potassium 4.7 3.5 - 5.1 mmol/L   Chloride 105 98 - 111 mmol/L   BUN 28 (H) 8 - 23 mg/dL   Creatinine, Ser 1.40 (H) 0.61 - 1.24 mg/dL   Glucose, Bld 96 70 - 99 mg/dL    Comment: Glucose reference range applies only to samples taken after fasting for at least 8 hours.   Calcium, Ion 1.17 1.15 - 1.40 mmol/L   TCO2 24 22 - 32 mmol/L   Hemoglobin 13.9 13.0 - 17.0 g/dL   HCT 41.0 39.0 - 52.0 %  CBG monitoring, ED     Status: None   Collection Time: 03/23/21  2:01 PM   Result Value Ref Range   Glucose-Capillary 93 70 - 99 mg/dL    Comment: Glucose reference range applies only to samples taken after fasting for at least 8 hours.   CT HEAD CODE STROKE WO CONTRAST  Result Date: 03/23/2021 CLINICAL DATA:  Code stroke. EXAM: CT HEAD WITHOUT CONTRAST TECHNIQUE: Contiguous axial images were obtained from the base of the skull through the vertex without intravenous contrast. COMPARISON:  None. FINDINGS: Brain: No acute infarct, hemorrhage, or mass lesion is present. Mild atrophy and white matter changes are present. The  ventricles are of normal size. No significant extraaxial fluid collection is present. The brainstem and cerebellum are within normal limits. Vascular: Atherosclerotic calcifications are present within the cavernous internal carotid arteries bilaterally. Calcifications are present at the dural margin of the vertebral arteries. No hyperdense vessel is present. Skull: Calvarium is intact. No focal lytic or blastic lesions are present. No significant extracranial soft tissue lesion is present. Sinuses/Orbits: The paranasal sinuses and mastoid air cells are clear. Bilateral lens replacements are noted. Globes and orbits are otherwise unremarkable. ASPECTS [facility] Stroke Program Early CT Score) - Ganglionic level infarction (caudate, lentiform nuclei, internal capsule, insula, M1-M3 cortex): 7/7 - Supraganglionic infarction (M4-M6 cortex): 3/3 Total score (0-10 with 10 being normal): 10/10 IMPRESSION: 1. No acute intracranial abnormality. 2. Mild atrophy and white matter changes, compatible with age 76. ASPECTS is 10/10 The above was relayed via text pager to Dr. Cheral Marker on 03/23/2021 at 14:03 . Electronically Signed   By: San Morelle M.D.   On: 03/23/2021 14:08    Assessment: 75 y.o. male presenting with acute onset of LLE weakness. 1. Exam reveals giveway weakness of the LLE with functional/inconsistent findings when testing strength using separate  modalities. DDx includes small ischemic stroke, TIA, acute left sided lumbar radiculopathy and psychogenic pseudostroke.  2. CT head:  No acute intracranial abnormality. Mild atrophy and white matter changes, compatible with age ASPECTS is 10/10 3. Stroke Risk Factors - Atrial fibrillation, CAD, CHF, rheumatic fever as a child, HLD and HTN 4. Has a statin allergy 5. Not a tPA candidate due to use of anticoagulant. Not a candidate for thrombectomy due to exam findings not being consistent with LVO.    Recommendations: 1. HgbA1c, fasting lipid panel 2. MRI, MRA of the brain without contrast 3. PT consult, OT consult, Speech consult 4. Echocardiogram 5. Carotid dopplers 6. Prophylactic therapy-Continue Eliquis 7. Risk factor modification 8. Telemetry monitoring 9. Frequent neuro checks 10. Modified permissive HTN protocol due to advanced age and use of anticoagulation. Treat SBP if > 180. After 24 hours, start normalizing gradually to a SBP goal of 120-140.    @Electronically  signed: Dr. Kerney Elbe  03/23/2021, 2:19 PM

## 2021-03-23 NOTE — ED Provider Notes (Signed)
[facility] EMERGENCY DEPARTMENT Provider Note   CSN: [id number] Arrival date & time: 03/23/21  1337     History Chief Complaint  Patient presents with   Code Stroke    [name] is a 75 y.o. male.  HPI  Patient presented to the ED with complaints of acute onset of weakness in his left lower extremity.  Patient states he was in his workshop at about noon today when he went to turn and suddenly he felt like his legs stopped working.  He was unable to walk properly and had to get his wife to take him to the hospital.  Patient does not have any trouble with his arms.  He is not having trouble with his speech.  Is not having any numbness.  Patient states he does have history of chronic back problems and has had epidural injections but it was always involving the right leg.  He is also not having any back pain or leg pain.  Code stroke was activated on arrival.  Patient does feel like his leg weakness is getting somewhat better now.  Past Medical History:  Diagnosis Date   A-fib ([place]) 03/22/2018   Anemia    CAD (coronary artery disease)    reportedly moderate CAD; managed medically   Cataract    removed from both eyes   CHF (congestive heart failure) ([place])    COPD (chronic obstructive pulmonary disease) ([place])    Eczema 08/03/2014   GERD (gastroesophageal reflux disease)    H/O: rheumatic fever    Heart murmur    High risk medication use    on amiodarone since 03/31/2012   History of colonoscopy    Hyperglycemia 06/21/2017   Hyperlipidemia    Hypertension    Kidney stone 06/21/2017   Lobar pneumonia ([place]) 03/22/2018   Low vitamin D level 06/21/2017   Macular degeneration    Osteoporosis 05/31/2016    Patient Active Problem List   Diagnosis Date Noted   Urinary frequency 03/04/2021   Hx of colonic polyps 11/24/2020   Diarrhea 11/24/2020   Dyspnea 04/20/2020   Anxiety 04/20/2020   Gastritis 04/10/2020   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   Elevated troponin 04/09/2020   Nausea, vomiting, and diarrhea 04/09/2020   Hypomagnesemia 04/09/2020   Hypokalemia 04/09/2020   Acute urinary retention 04/09/2020   Statin intolerance 12/19/2019   Vitamin D deficiency 09/18/2019   CHF (congestive heart failure) ([place]) 03/22/2018   PAF (paroxysmal atrial fibrillation) ([place]) 03/22/2018   Edema 03/22/2018   H/O: rheumatic fever 12/27/2017   Hyperglycemia 06/21/2017   Kidney stone 06/21/2017   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   Osteoporosis 05/31/2016   BPH (benign prostatic hyperplasia) 01/20/2015   Erectile dysfunction 01/20/2015   Rectal bleeding 01/20/2015   Preventative health care 01/20/2015   Eczema 08/03/2014   Sleep apnea 03/01/2014   Anticoagulation adequate with anticoagulant therapy 03/01/2014   Pre-syncope 02/26/2014   Abdominal pain 02/26/2014   PAD (peripheral artery disease) ([place]) 02/26/2014   Constipation 03/06/2013   Easy bruising 03/06/2013   History of alcohol abuse 04/15/2012   CAD (coronary atherosclerotic disease) 05/08/2011   Atypical chest pain 03/10/2011   LUMBAR RADICULOPATHY, 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   UNS ADVRS EFF UNS RX MEDICINAL&BIOLOGICAL SBSTNC 11/11/2009   H/O tobacco use, presenting hazards to health 07/29/2009   COPD GOLD I 07/29/2009   Hyperlipidemia, mixed 06/20/2007   Macular degeneration (senile) of  retina 06/20/2007   Essential hypertension 06/20/2007   GERD 06/16/2007    Past Surgical History:  Procedure Laterality Date   APPENDECTOMY     CARDIAC CATHETERIZATION  04/22/2011   moderate left main and RCA stenosis not significant by FFR and IVUS on medical therapy   CARDIOVERSION N/A 04/25/2018   Procedure: CARDIOVERSION;  Surgeon: Skeet Latch, MD;  Location: [facility];  Service: Cardiovascular;  Laterality: N/A;   CERVICAL DISCECTOMY     C5,6,7 disc fused  with plate and 5 1" screws    CHOLECYSTECTOMY     colon polyp removal  3/1.19   COLONOSCOPY     FOOT SURGERY     right calcification removed from top of foot   knee cartiledge Right    right knee   MOUTH SURGERY     periodontal surgery, bridges, splint in front,    UPPER GASTROINTESTINAL ENDOSCOPY         Family History  Problem Relation Age of Onset   Heart disease Mother    Diabetes Mother    Cirrhosis Mother    Emphysema Mother        never smoked but 2nd hand through her spouse   Hypertension Mother    Macular degeneration Mother    Heart disease Father    Cancer Father        prostate   Hyperlipidemia Father    Hypertension Father    Varicose Veins Father    Heart attack Father    Peripheral vascular disease Father    Heart disease Sister    Arthritis Sister    Hyperlipidemia Sister    Obesity Sister    Macular degeneration Sister    Heart disease Brother        5 stents   Hyperlipidemia Brother    Macular degeneration Maternal Grandfather    Cirrhosis Sister    Obesity Sister    Arthritis Sister    Heart disease Sister    Obesity Sister    Liver disease Other    Prostate cancer Other    Coronary artery disease Other    Colon cancer Neg Hx    Esophageal cancer Neg Hx    Rectal cancer Neg Hx    Stomach cancer Neg Hx     Social History   Tobacco Use   Smoking status: Former    Packs/day: 0.50    Years: 50.00    Pack years: 25.00    Types: Cigarettes    Quit date: 05/01/2009    Years since quitting: 11.9   Smokeless tobacco: Former    Types: Nurse, children's Use: Never used  Substance Use Topics   Alcohol use: Yes    Alcohol/week: 1.0 standard drink    Types: 1 Cans of beer per week    Comment: 1 beer daily   Drug use: No    Home Medications Prior to Admission medications   Medication Sig Start Date End Date Taking? Authorizing Provider  amoxicillin (AMOXIL) 500 MG capsule SMARTSIG:4 Capsule(s) By Mouth Once 01/07/21   [provider]  brimonidine  (ALPHAGAN) 0.2 % ophthalmic solution Place 1 drop into the left eye in the morning and at bedtime.  09/10/17   [provider]  budesonide (ENTOCORT EC) 3 MG 24 hr capsule Take 1 capsule (3 mg total) by mouth in the morning, at noon, and at bedtime. 03/05/21 05/04/21  Cirigliano, Vito V, DO  budesonide (ENTOCORT EC) 3 MG 24 hr capsule  Take 1 capsule (3 mg total) by mouth 2 (two) times daily for 14 days. 05/05/21 05/19/21  Cirigliano, Vito V, DO  budesonide (ENTOCORT EC) 3 MG 24 hr capsule Take 1 capsule (3 mg total) by mouth daily for 14 doses. 05/19/21 06/02/21  Cirigliano, Vito V, DO  Cholecalciferol (VITAMIN D3) 5000 units CAPS Take 10,000 Units by mouth every other day.     [provider]  cholestyramine (QUESTRAN) 4 g packet Take 1 packet (4 g total) by mouth 2 (two) times daily. 03/13/21 06/11/21  [name], MD  clobetasol (TEMOVATE) 0.05 % external solution APPLY 1 APPLICATION TOPICALLY TWICE DAILY Patient taking differently: Apply 1 application topically 2 (two) times daily as needed (irritation). APPLY 1 APPLICATION TOPICALLY TWICE DAILY AS NEEDED 11/20/16   [name], MD  diphenoxylate-atropine (LOMOTIL) 2.5-0.025 MG tablet Take 1 tablet by mouth in the morning, at noon, and at bedtime. Take 2 tablets in the morning then 1 as needed every 6 hours for diarrhea 03/05/21 04/04/21  Cirigliano, Vito V, DO  ELIQUIS 5 MG TABS tablet TAKE 1 TABLET BY MOUTH  TWICE DAILY Patient taking differently: Take 5 mg by mouth 2 (two) times daily. 06/24/20   Evans Lance, MD  fluconazole (DIFLUCAN) 100 MG tablet TAKE 1 TABLET BY MOUTH DAILY FOR 3 DAYS. MAY REPEAT IN 1 WEEK 03/13/21   [name], MD  furosemide (LASIX) 20 MG tablet Take 1 tablet (20 mg total) by mouth daily. 09/16/20   [name], MD  hydrocortisone 2.5 % lotion Apply 1 application topically 2 (two) times daily. 09/04/20   [provider]  latanoprost (XALATAN) 0.005 % ophthalmic solution Place 1 drop into the left  eye at bedtime.     [provider]  levalbuterol Penne Lash HFA) 45 MCG/ACT inhaler Inhale 2 puffs into the lungs every 4 (four) hours as needed for wheezing. 06/05/20 06/05/21  Saguier, Percell Miller, PA-C  magnesium oxide (MAG-OX) 400 (240 Mg) MG tablet TAKE 1 TABLET(400 MG) BY MOUTH THREE TIMES DAILY AS NEEDED 02/24/21   [name], MD  Melatonin 3 MG CAPS 1 cap PO QD    [provider]  metoprolol tartrate (LOPRESSOR) 25 MG tablet TAKE 1 TABLET BY MOUTH  TWICE DAILY Patient taking differently: Take 25 mg by mouth 2 (two) times daily. 06/24/20   Evans Lance, MD  Multiple Vitamins-Minerals (ICAPS MV) TABS Take 1 tablet by mouth 2 (two) times daily.    [provider]  nitroGLYCERIN (NITROSTAT) 0.4 MG SL tablet Place 1 tablet (0.4 mg total) under the tongue every 5 (five) minutes as needed for chest pain. 04/14/20   Allie Bossier, MD  omeprazole (PRILOSEC OTC) 20 MG tablet Take 20 mg by mouth daily.    [provider]  potassium chloride (KLOR-CON) 10 MEQ tablet Take 1 tablet (10 mEq total) by mouth daily. 09/17/20   Evans Lance, MD  propafenone (RYTHMOL) 225 MG tablet TAKE 1 TABLET BY MOUTH  TWICE DAILY 11/12/20   Evans Lance, MD  Ranibizumab (LUCENTIS IO) Inject 1 Dose into the eye as needed (Macular degeneration).     [provider]  tamsulosin (FLOMAX) 0.4 MG CAPS capsule Take 0.4 mg by mouth every evening.    [provider]    Allergies    Albumin (human), Polymyxin b-trimethoprim, Pseudoephedrine, Codeine, Guaiacol, Statins, Gabapentin, Meloxicam, Pseudoephedrine-guaifenesin, Rosuvastatin calcium, Tapentadol, Ciprofloxacin, Moxifloxacin, and Rofecoxib  Review of Systems   Review of Systems  All other systems reviewed and are negative.  Physical Exam Updated Vital Signs BP 127/61   Pulse (!) 52   Temp 98 F (36.7 C) (Oral)   Resp (!) 27   Wt 75.4 kg   SpO2 99%   BMI 28.53 kg/m   Physical Exam Vitals and nursing note  reviewed.  Constitutional:      General: He is not in acute distress.    Appearance: He is well-developed.  HENT:     Head: Normocephalic and atraumatic.     Right Ear: External ear normal.     Left Ear: External ear normal.  Eyes:     General: No scleral icterus.       Right eye: No discharge.        Left eye: No discharge.     Conjunctiva/sclera: Conjunctivae normal.  Neck:     Trachea: No tracheal deviation.  Cardiovascular:     Rate and Rhythm: Normal rate and regular rhythm.  Pulmonary:     Effort: Pulmonary effort is normal. No respiratory distress.     Breath sounds: Normal breath sounds. No stridor. No wheezing or rales.  Abdominal:     General: Bowel sounds are normal. There is no distension.     Palpations: Abdomen is soft.     Tenderness: There is no abdominal tenderness. There is no guarding or rebound.  Musculoskeletal:        General: No tenderness.     Cervical back: Neck supple.  Skin:    General: Skin is warm and dry.     Findings: No rash.  Neurological:     Mental Status: He is alert and oriented to person, place, and time.     Cranial Nerves: No cranial nerve deficit (No facial droop, extraocular movements intact, tongue midline ).     Sensory: No sensory deficit.     Motor: Weakness present. No abnormal muscle tone or seizure activity.     Coordination: Coordination normal.     Comments: No pronator drift bilateral upper extrem, unable to hold left  legs off bed for 5 seconds, sensation intact in all extremities, no visual field cuts, no left or right sided neglect, normal finger-nose exam bilaterally, no nystagmus noted     ED Results / Procedures / Treatments   Labs (all labs ordered are listed, but only abnormal results are displayed) Labs Reviewed  PROTIME-INR - Abnormal; Notable for the following components:      Result Value   Prothrombin Time 15.9 (*)    INR 1.3 (*)    All other components within normal limits  CBC - Abnormal; Notable for  the following components:   WBC 11.5 (*)    All other components within normal limits  DIFFERENTIAL - Abnormal; Notable for the following components:   Abs Immature Granulocytes 0.11 (*)    All other components within normal limits  I-STAT CHEM 8, ED - Abnormal; Notable for the following components:   BUN 28 (*)    Creatinine, Ser 1.40 (*)    All other components within normal limits  RESP PANEL BY RT-PCR (FLU A&B, COVID) ARPGX2  APTT  ETHANOL  COMPREHENSIVE METABOLIC PANEL  RAPID URINE DRUG SCREEN, HOSP PERFORMED  URINALYSIS, ROUTINE W REFLEX MICROSCOPIC  CBG MONITORING, ED    EKG EKG Interpretation  Date/Time:  Sunday March 23 2021 14:18:09 EDT Ventricular Rate:  55 PR Interval:    QRS Duration: 104 QT Interval:  434 QTC Calculation: 416 R Axis:   -  16 Text Interpretation: poor data quality, probable sinus rhythm Borderline left axis deviation Minimal ST depression, diffuse leads Confirmed by Dorie Rank [phone]) on 03/23/2021 2:30:38 PM  Radiology CT HEAD CODE STROKE WO CONTRAST  Result Date: 03/23/2021 CLINICAL DATA:  Code stroke. EXAM: CT HEAD WITHOUT CONTRAST TECHNIQUE: Contiguous axial images were obtained from the base of the skull through the vertex without intravenous contrast. COMPARISON:  None. FINDINGS: Brain: No acute infarct, hemorrhage, or mass lesion is present. Mild atrophy and white matter changes are present. The ventricles are of normal size. No significant extraaxial fluid collection is present. The brainstem and cerebellum are within normal limits. Vascular: Atherosclerotic calcifications are present within the cavernous internal carotid arteries bilaterally. Calcifications are present at the dural margin of the vertebral arteries. No hyperdense vessel is present. Skull: Calvarium is intact. No focal lytic or blastic lesions are present. No significant extracranial soft tissue lesion is present. Sinuses/Orbits: The paranasal sinuses and mastoid air cells are clear.  Bilateral lens replacements are noted. Globes and orbits are otherwise unremarkable. ASPECTS [facility] Stroke Program Early CT Score) - Ganglionic level infarction (caudate, lentiform nuclei, internal capsule, insula, M1-M3 cortex): 7/7 - Supraganglionic infarction (M4-M6 cortex): 3/3 Total score (0-10 with 10 being normal): 10/10 IMPRESSION: 1. No acute intracranial abnormality. 2. Mild atrophy and white matter changes, compatible with age 19. ASPECTS is 10/10 The above was relayed via text pager to Dr. Cheral Marker on 03/23/2021 at 14:03 . Electronically Signed   By: San Morelle M.D.   On: 03/23/2021 14:08    Procedures Procedures   Medications Ordered in ED Medications - No data to display  ED Course  I have reviewed the triage vital signs and the nursing notes.  Pertinent labs & imaging results that were available during my care of the patient were reviewed by me and considered in my medical decision making (see chart for details).    MDM Rules/Calculators/A&P                          Pt presented with acute onset of leg weakness.  No pain.  Activated as a code stroke.  Seen by Neurology, Dr Cheral Marker on arrival.  CT without acute findings.  Presentation concerning for tia stroke, less likely spinal etiology.  Pt admitted for TIA /Stroke workup. Final Clinical Impression(s) / ED Diagnoses Final diagnoses:  Stroke-like symptoms     Dorie Rank, MD 03/25/21 [phone]

## 2021-03-23 NOTE — ED Notes (Signed)
Patient transported to MRI 

## 2021-03-23 NOTE — ED Triage Notes (Signed)
Pt here for sudden onset L leg weakness at 1200. Pt completed his normal bike ride, was working in workshop and had sudden loss of movement in L leg.

## 2021-03-23 NOTE — H&P (Addendum)
History and Physical    Ryan [name] YBO:[id number] DOB: Mar 06, 1946 DOA: 03/23/2021  PCP: [name], MD (Confirm with patient/family/NH records and if not entered, this has to be entered at [facility] point of entry) Patient coming from: Home  I have personally briefly reviewed patient's old medical records in [place]  Chief Complaint: Left leg weakness  HPI: Ryan [name] is a 75 y.o. 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  Has a chronic history of left 4-5 radiculopathy which including typical symptoms of shooting down pain from hip to the back of the knee, for which he has been following with neurosurgery to get a local injection every 4 to 66-month, and he just received another injection 2 weeks ago.  He has no history of claudication either.  Today patient was pedaling his E back in the morning and he went to the bathroom but while he was turning his body around, all of sudden, "I lost my left leg" he could not support himself with the left leg To hold against a wall to balance and he also lost sensation for an hour or two of the left leg.  He denied any loss control of urination or numbness on the saddle area.  He also reported similar but much shorter episode 4 weeks ago while paddling his the back where he had weakness of the left leg but " shook it off in 10-15 min".  Denied any numbness or weakening of any other limbs, no vision changes no headache or blurred vision. ED Course: CT head negative, code stroke called, no tPA given patient has history of PAF and on Eliquis.  Review of Systems: As per HPI otherwise 14 point review of systems negative.    Past Medical History:  Diagnosis Date   A-fib ([place]) 03/22/2018   Anemia    CAD (coronary artery disease)    reportedly  moderate CAD; managed medically   Cataract    removed from both eyes   CHF (congestive heart failure) (HCC)    COPD (chronic obstructive pulmonary disease) ([place])    Eczema 08/03/2014   GERD (gastroesophageal reflux disease)    H/O: rheumatic fever    Heart murmur    High risk medication use    on amiodarone since 03/31/2012   History of colonoscopy    Hyperglycemia 06/21/2017   Hyperlipidemia    Hypertension    Kidney stone 06/21/2017   Lobar pneumonia ([place]) 03/22/2018   Low vitamin D level 06/21/2017   Macular degeneration    Osteoporosis 05/31/2016    Past Surgical History:  Procedure Laterality Date   APPENDECTOMY     CARDIAC CATHETERIZATION  04/22/2011   moderate left main and RCA stenosis not significant by FFR and IVUS on medical therapy   CARDIOVERSION N/A 04/25/2018   Procedure: CARDIOVERSION;  Surgeon: Skeet Latch, MD;  Location: [facility];  Service: Cardiovascular;  Laterality: N/A;   CERVICAL DISCECTOMY     C5,6,7 disc fused  with plate and 5 1" screws   CHOLECYSTECTOMY     colon polyp removal  3/1.19   COLONOSCOPY     FOOT SURGERY     right calcification removed from top of foot   knee cartiledge Right    right knee   MOUTH SURGERY     periodontal surgery,  bridges, splint in front,    UPPER GASTROINTESTINAL ENDOSCOPY       reports that he quit smoking about 11 years ago. His smoking use included cigarettes. He has a 25.00 pack-year smoking history. He has quit using smokeless tobacco.  His smokeless tobacco use included chew. He reports current alcohol use of about 1.0 standard drink of alcohol per week. He reports that he does not use drugs.  Allergies  Allergen Reactions   Albumin (Human) Anaphylaxis   Polymyxin B-Trimethoprim Swelling    Eye drops made eyes swell   Pseudoephedrine Other (See Comments)    Stomach cramps   Codeine Hives, Itching and Rash   Guaiacol Other (See Comments)    Hallucinations   Statins Other (See Comments)    Muscle  cramps    Gabapentin Other (See Comments)   Meloxicam Other (See Comments)    Unknown reaction   Pseudoephedrine-Guaifenesin Nausea And Vomiting    Stomach cramps   Rosuvastatin Calcium Other (See Comments)    Muscle aches   Tapentadol Other (See Comments)    Unknown reaction   Ciprofloxacin Hives, Itching, Nausea Only and Rash   Moxifloxacin Nausea Only and Other (See Comments)    Headaches, stomach cramps    Rofecoxib Other (See Comments)    Stomach cramping    Family History  Problem Relation Age of Onset   Heart disease Mother    Diabetes Mother    Cirrhosis Mother    Emphysema Mother        never smoked but 2nd hand through her spouse   Hypertension Mother    Macular degeneration Mother    Heart disease Father    Cancer Father        prostate   Hyperlipidemia Father    Hypertension Father    Varicose Veins Father    Heart attack Father    Peripheral vascular disease Father    Heart disease Sister    Arthritis Sister    Hyperlipidemia Sister    Obesity Sister    Macular degeneration Sister    Heart disease Brother        5 stents   Hyperlipidemia Brother    Macular degeneration Maternal Grandfather    Cirrhosis Sister    Obesity Sister    Arthritis Sister    Heart disease Sister    Obesity Sister    Liver disease Other    Prostate cancer Other    Coronary artery disease Other    Colon cancer Neg Hx    Esophageal cancer Neg Hx    Rectal cancer Neg Hx    Stomach cancer Neg Hx     Prior to Admission medications   Medication Sig Start Date End Date Taking? Authorizing Provider  amoxicillin (AMOXIL) 500 MG capsule SMARTSIG:4 Capsule(s) By Mouth Once 01/07/21   [provider]  brimonidine (ALPHAGAN) 0.2 % ophthalmic solution Place 1 drop into the left eye in the morning and at bedtime.  09/10/17   [provider]  budesonide (ENTOCORT EC) 3 MG 24 hr capsule Take 1 capsule (3 mg total) by mouth in the morning, at noon, and at bedtime. 03/05/21  05/04/21  Cirigliano, Vito V, DO  budesonide (ENTOCORT EC) 3 MG 24 hr capsule Take 1 capsule (3 mg total) by mouth 2 (two) times daily for 14 days. 05/05/21 05/19/21  Cirigliano, Vito V, DO  budesonide (ENTOCORT EC) 3 MG 24 hr capsule Take 1 capsule (3 mg total) by mouth daily for 14 doses.  05/19/21 06/02/21  Cirigliano, Vito V, DO  Cholecalciferol (VITAMIN D3) 5000 units CAPS Take 10,000 Units by mouth every other day.     [provider]  cholestyramine (QUESTRAN) 4 g packet Take 1 packet (4 g total) by mouth 2 (two) times daily. 03/13/21 06/11/21  [name], MD  clobetasol (TEMOVATE) 0.05 % external solution APPLY 1 APPLICATION TOPICALLY TWICE DAILY Patient taking differently: Apply 1 application topically 2 (two) times daily as needed (irritation). APPLY 1 APPLICATION TOPICALLY TWICE DAILY AS NEEDED 11/20/16   [name], MD  diphenoxylate-atropine (LOMOTIL) 2.5-0.025 MG tablet Take 1 tablet by mouth in the morning, at noon, and at bedtime. Take 2 tablets in the morning then 1 as needed every 6 hours for diarrhea 03/05/21 04/04/21  Cirigliano, Vito V, DO  ELIQUIS 5 MG TABS tablet TAKE 1 TABLET BY MOUTH  TWICE DAILY Patient taking differently: Take 5 mg by mouth 2 (two) times daily. 06/24/20   Evans Lance, MD  fluconazole (DIFLUCAN) 100 MG tablet TAKE 1 TABLET BY MOUTH DAILY FOR 3 DAYS. MAY REPEAT IN 1 WEEK 03/13/21   [name], MD  furosemide (LASIX) 20 MG tablet Take 1 tablet (20 mg total) by mouth daily. 09/16/20   [name], MD  hydrocortisone 2.5 % lotion Apply 1 application topically 2 (two) times daily. 09/04/20   [provider]  latanoprost (XALATAN) 0.005 % ophthalmic solution Place 1 drop into the left eye at bedtime.     [provider]  levalbuterol Penne Lash HFA) 45 MCG/ACT inhaler Inhale 2 puffs into the lungs every 4 (four) hours as needed for wheezing. 06/05/20 06/05/21  Saguier, Percell Miller, PA-C  magnesium oxide (MAG-OX) 400 (240 Mg) MG tablet TAKE 1  TABLET(400 MG) BY MOUTH THREE TIMES DAILY AS NEEDED 02/24/21   [name], MD  Melatonin 3 MG CAPS 1 cap PO QD    [provider]  metoprolol tartrate (LOPRESSOR) 25 MG tablet TAKE 1 TABLET BY MOUTH  TWICE DAILY Patient taking differently: Take 25 mg by mouth 2 (two) times daily. 06/24/20   Evans Lance, MD  Multiple Vitamins-Minerals (ICAPS MV) TABS Take 1 tablet by mouth 2 (two) times daily.    [provider]  nitroGLYCERIN (NITROSTAT) 0.4 MG SL tablet Place 1 tablet (0.4 mg total) under the tongue every 5 (five) minutes as needed for chest pain. 04/14/20   Allie Bossier, MD  omeprazole (PRILOSEC OTC) 20 MG tablet Take 20 mg by mouth daily.    [provider]  potassium chloride (KLOR-CON) 10 MEQ tablet Take 1 tablet (10 mEq total) by mouth daily. 09/17/20   Evans Lance, MD  propafenone (RYTHMOL) 225 MG tablet TAKE 1 TABLET BY MOUTH  TWICE DAILY 11/12/20   Evans Lance, MD  Ranibizumab (LUCENTIS IO) Inject 1 Dose into the eye as needed (Macular degeneration).     [provider]  tamsulosin (FLOMAX) 0.4 MG CAPS capsule Take 0.4 mg by mouth every evening.    [provider]    Physical Exam: Vitals:   03/23/21 1445 03/23/21 1500 03/23/21 1515 03/23/21 1615  BP: (!) 146/65 (!) 151/63 (!) 165/58 135/66  Pulse: (!) 49 (!) 50 (!) 51 (!) 50  Resp: 18 20 12 19   Temp:      TempSrc:      SpO2: 96% 96% 97% 97%  Weight:        Constitutional: NAD, calm, comfortable Vitals:   03/23/21 1445 03/23/21  1500 03/23/21 1515 03/23/21 1615  BP: (!) 146/65 (!) 151/63 (!) 165/58 135/66  Pulse: (!) 49 (!) 50 (!) 51 (!) 50  Resp: 18 20 12 19   Temp:      TempSrc:      SpO2: 96% 96% 97% 97%  Weight:       Eyes: PERRL, lids and conjunctivae normal ENMT: Mucous membranes are moist. Posterior pharynx clear of any exudate or lesions.Normal dentition.  Neck: normal, supple, no masses, no thyromegaly Respiratory: clear to auscultation bilaterally, no  wheezing, no crackles. Normal respiratory effort. No accessory muscle use.  Cardiovascular: Regular rate and rhythm, no murmurs / rubs / gallops. No extremity edema. 2+ pedal pulses. No carotid bruits.  Abdomen: no tenderness, no masses palpated. No hepatosplenomegaly. Bowel sounds positive.  Musculoskeletal: no clubbing / cyanosis. No joint deformity upper and lower extremities. Good ROM, no contractures. Normal muscle tone.  Skin: no rashes, lesions, ulcers. No induration.  Left lower extremity below the knee feels colder compared to the right side below the knee Neurologic: CN 2-12 grossly intact. Sensation intact, DTR normal.  Strength 3/5 on ventral flexion and on the dorsal extension on left side compared to 5/5 on the right side.  Psychiatric: Normal judgment and insight. Alert and oriented x 3. Normal mood.     Labs on Admission: I have personally reviewed following labs and imaging studies  CBC: Recent Labs  Lab 03/23/21 1351 03/23/21 1401  WBC 11.5*  --   NEUTROABS 7.6  --   HGB 14.3 13.9  HCT 43.0 41.0  MCV 92.7  --   PLT 195  --    Basic Metabolic Panel: Recent Labs  Lab 03/23/21 1351 03/23/21 1401  NA 136 138  K 4.8 4.7  CL 102 105  CO2 27  --   GLUCOSE 100* 96  BUN 27* 28*  CREATININE 1.41* 1.40*  CALCIUM 9.4  --    GFR: Estimated Creatinine Clearance: 43 mL/min (A) (by C-G formula based on SCr of 1.4 mg/dL (H)). Liver Function Tests: Recent Labs  Lab 03/23/21 1351  AST 19  ALT 13  ALKPHOS 60  BILITOT 0.9  PROT 7.0  ALBUMIN 3.6   No results for input(s): LIPASE, AMYLASE in the last 168 hours. No results for input(s): AMMONIA in the last 168 hours. Coagulation Profile: Recent Labs  Lab 03/23/21 1351  INR 1.3*   Cardiac Enzymes: No results for input(s): CKTOTAL, CKMB, CKMBINDEX, TROPONINI in the last 168 hours. BNP (last 3 results) Recent Labs    09/09/20 1400  PROBNP 209.0*   HbA1C: No results for input(s): HGBA1C in the last 72  hours. CBG: Recent Labs  Lab 03/23/21 1401  GLUCAP 93   Lipid Profile: No results for input(s): CHOL, HDL, LDLCALC, TRIG, CHOLHDL, LDLDIRECT in the last 72 hours. Thyroid Function Tests: No results for input(s): TSH, T4TOTAL, FREET4, T3FREE, THYROIDAB in the last 72 hours. Anemia Panel: No results for input(s): VITAMINB12, FOLATE, FERRITIN, TIBC, IRON, RETICCTPCT in the last 72 hours. Urine analysis:    Component Value Date/Time   COLORURINE YELLOW 03/04/2021 1435   APPEARANCEUR CLEAR 03/04/2021 1435   LABSPEC 1.015 03/04/2021 1435   PHURINE 6.0 03/04/2021 1435   GLUCOSEU NEGATIVE 03/04/2021 1435   HGBUR NEGATIVE 03/04/2021 1435   HGBUR negative 03/15/2009 0804   BILIRUBINUR NEGATIVE 03/04/2021 1435   BILIRUBINUR n 12/01/2013 1136   KETONESUR NEGATIVE 03/04/2021 1435   PROTEINUR NEGATIVE 04/09/2020 1025   UROBILINOGEN 0.2 03/04/2021 1435   NITRITE  NEGATIVE 03/04/2021 [place] 03/04/2021 1435    Radiological Exams on Admission: CT HEAD CODE STROKE WO CONTRAST  Result Date: 03/23/2021 CLINICAL DATA:  Code stroke. EXAM: CT HEAD WITHOUT CONTRAST TECHNIQUE: Contiguous axial images were obtained from the base of the skull through the vertex without intravenous contrast. COMPARISON:  None. FINDINGS: Brain: No acute infarct, hemorrhage, or mass lesion is present. Mild atrophy and white matter changes are present. The ventricles are of normal size. No significant extraaxial fluid collection is present. The brainstem and cerebellum are within normal limits. Vascular: Atherosclerotic calcifications are present within the cavernous internal carotid arteries bilaterally. Calcifications are present at the dural margin of the vertebral arteries. No hyperdense vessel is present. Skull: Calvarium is intact. No focal lytic or blastic lesions are present. No significant extracranial soft tissue lesion is present. Sinuses/Orbits: The paranasal sinuses and mastoid air cells are  clear. Bilateral lens replacements are noted. Globes and orbits are otherwise unremarkable. ASPECTS [facility] Stroke Program Early CT Score) - Ganglionic level infarction (caudate, lentiform nuclei, internal capsule, insula, M1-M3 cortex): 7/7 - Supraganglionic infarction (M4-M6 cortex): 3/3 Total score (0-10 with 10 being normal): 10/10 IMPRESSION: 1. No acute intracranial abnormality. 2. Mild atrophy and white matter changes, compatible with age 70. ASPECTS is 10/10 The above was relayed via text pager to Dr. Cheral Marker on 03/23/2021 at 14:03 . Electronically Signed   By: San Morelle M.D.   On: 03/23/2021 14:08    EKG: Independently reviewed.  Sinus bradycardia  Assessment/Plan Active Problems:   CVA (cerebral vascular accident) ([place])  (please populate well all problems here in Problem List. (For example, if patient is on BP meds at home and you resume or decide to hold them, it is a problem that needs to be her. Same for CAD, COPD, HLD and so on)  Right leg paresis and paresthesia -Rule out CVA. If MRI brain negative, propose outpatient lumbar MRI to rule out L4-5 left sided radiculopathy (right side is chronic).  Explained to patient and his wife, both agreed. -Continue Eliquis and statin -PT evaluation.  Rule out PVD -With significant temp differences on left and right leg, but no clear Hx of claudication -ABI  L4-5 radiculopathy -Again recommend patient follow-up with his neurosurgery for outpatient lumbar MRI and further local injection therapy.  PAF -Present, continue propafenone and beta-blocker, continue Eliquis.  Lymphocytic colitis -With recent flareup, has been following with GI outpatient, continue p.o. Entocort  HTN -Controlled  HLD -Statin.  CKD stage II -Euvolemic, avoid contrast  DVT prophylaxis: Eliquis Code Status: Full code Family Communication: Wife at bedside Disposition Plan: Expect less than 2 midnight hospital stay Consults called:  Neurology Admission status: Telemetry observation   [name] MD [facility] Pager [phone]  03/23/2021, 4:21 PM

## 2021-03-23 NOTE — ED Notes (Signed)
Assisted pt with meal at bedside.

## 2021-03-24 ENCOUNTER — Observation Stay (HOSPITAL_COMMUNITY): Payer: Medicare Other

## 2021-03-24 ENCOUNTER — Observation Stay (HOSPITAL_BASED_OUTPATIENT_CLINIC_OR_DEPARTMENT_OTHER): Payer: Medicare Other

## 2021-03-24 DIAGNOSIS — R531 Weakness: Secondary | ICD-10-CM | POA: Diagnosis present

## 2021-03-24 DIAGNOSIS — R2990 Unspecified symptoms and signs involving the nervous system: Secondary | ICD-10-CM

## 2021-03-24 DIAGNOSIS — I779 Disorder of arteries and arterioles, unspecified: Secondary | ICD-10-CM

## 2021-03-24 DIAGNOSIS — M810 Age-related osteoporosis without current pathological fracture: Secondary | ICD-10-CM | POA: Diagnosis present

## 2021-03-24 DIAGNOSIS — I639 Cerebral infarction, unspecified: Secondary | ICD-10-CM

## 2021-03-24 DIAGNOSIS — Z87891 Personal history of nicotine dependence: Secondary | ICD-10-CM | POA: Diagnosis not present

## 2021-03-24 DIAGNOSIS — I480 Paroxysmal atrial fibrillation: Secondary | ICD-10-CM | POA: Diagnosis present

## 2021-03-24 DIAGNOSIS — N400 Enlarged prostate without lower urinary tract symptoms: Secondary | ICD-10-CM | POA: Diagnosis present

## 2021-03-24 DIAGNOSIS — N182 Chronic kidney disease, stage 2 (mild): Secondary | ICD-10-CM | POA: Diagnosis present

## 2021-03-24 DIAGNOSIS — Z8249 Family history of ischemic heart disease and other diseases of the circulatory system: Secondary | ICD-10-CM | POA: Diagnosis not present

## 2021-03-24 DIAGNOSIS — K219 Gastro-esophageal reflux disease without esophagitis: Secondary | ICD-10-CM | POA: Diagnosis present

## 2021-03-24 DIAGNOSIS — I6300 Cerebral infarction due to thrombosis of unspecified precerebral artery: Secondary | ICD-10-CM

## 2021-03-24 DIAGNOSIS — Z888 Allergy status to other drugs, medicaments and biological substances status: Secondary | ICD-10-CM | POA: Diagnosis not present

## 2021-03-24 DIAGNOSIS — Z825 Family history of asthma and other chronic lower respiratory diseases: Secondary | ICD-10-CM | POA: Diagnosis not present

## 2021-03-24 DIAGNOSIS — I5032 Chronic diastolic (congestive) heart failure: Secondary | ICD-10-CM | POA: Diagnosis present

## 2021-03-24 DIAGNOSIS — Z885 Allergy status to narcotic agent status: Secondary | ICD-10-CM | POA: Diagnosis not present

## 2021-03-24 DIAGNOSIS — I6501 Occlusion and stenosis of right vertebral artery: Secondary | ICD-10-CM | POA: Diagnosis present

## 2021-03-24 DIAGNOSIS — M5416 Radiculopathy, lumbar region: Secondary | ICD-10-CM | POA: Diagnosis present

## 2021-03-24 DIAGNOSIS — J449 Chronic obstructive pulmonary disease, unspecified: Secondary | ICD-10-CM | POA: Diagnosis present

## 2021-03-24 DIAGNOSIS — Z833 Family history of diabetes mellitus: Secondary | ICD-10-CM | POA: Diagnosis not present

## 2021-03-24 DIAGNOSIS — E785 Hyperlipidemia, unspecified: Secondary | ICD-10-CM | POA: Diagnosis present

## 2021-03-24 DIAGNOSIS — Z20822 Contact with and (suspected) exposure to covid-19: Secondary | ICD-10-CM | POA: Diagnosis present

## 2021-03-24 DIAGNOSIS — I130 Hypertensive heart and chronic kidney disease with heart failure and stage 1 through stage 4 chronic kidney disease, or unspecified chronic kidney disease: Secondary | ICD-10-CM | POA: Diagnosis present

## 2021-03-24 DIAGNOSIS — G459 Transient cerebral ischemic attack, unspecified: Secondary | ICD-10-CM | POA: Diagnosis not present

## 2021-03-24 DIAGNOSIS — Z7901 Long term (current) use of anticoagulants: Secondary | ICD-10-CM | POA: Diagnosis not present

## 2021-03-24 DIAGNOSIS — K52832 Lymphocytic colitis: Secondary | ICD-10-CM | POA: Diagnosis present

## 2021-03-24 DIAGNOSIS — I2510 Atherosclerotic heart disease of native coronary artery without angina pectoris: Secondary | ICD-10-CM | POA: Diagnosis present

## 2021-03-24 DIAGNOSIS — E669 Obesity, unspecified: Secondary | ICD-10-CM | POA: Diagnosis present

## 2021-03-24 DIAGNOSIS — Z8261 Family history of arthritis: Secondary | ICD-10-CM | POA: Diagnosis not present

## 2021-03-24 HISTORY — DX: Disorder of arteries and arterioles, unspecified: I77.9

## 2021-03-24 LAB — LIPID PANEL
Cholesterol: 289 mg/dL — ABNORMAL HIGH (ref 0–200)
HDL: 51 mg/dL (ref >40)
LDL Cholesterol: 222 mg/dL — ABNORMAL HIGH (ref 0–99)
Total CHOL/HDL Ratio: 5.7 RATIO
Triglycerides: 80 mg/dL (ref <150)
VLDL: 16 mg/dL (ref 0–40)

## 2021-03-24 NOTE — Discharge Instructions (Addendum)

## 2021-03-24 NOTE — Progress Notes (Signed)
STROKE TEAM PROGRESS NOTE   INTERVAL HISTORY No acute events.  Mr. Caraway describes onset of left leg weakness shortly after a bike ride while standing up to urinate in the urinal in his workshop. He questions if the problem is coming from his back. He routinely gets L4-5 epidural injection every 4 months for right lower extremity radiculopathy.  He denies any recent fall, acute back pain or increase radicular pain.  He reports taking his Eliquis as prescribed.   We discussed the stroke diagnosis,ongoing work up and plan of care including anticoagulation options. He pays 60 per month for Eliquis which is affordable for him. He is willing to explore other options.   Vitals:   03/24/21 0600 03/24/21 0630 03/24/21 0700 03/24/21 0855  BP: (!) 146/60 (!) 138/52 (!) 160/56 109/62  Pulse: (!) 47 (!) 49 (!) 57 (!) 54  Resp: 13 16 19 17   Temp:    97.6 F (36.4 C)  TempSrc:    Oral  SpO2: 98% 94% 98% 98%  Weight:       CBC:  Recent Labs  Lab 03/23/21 1351 03/23/21 1401  WBC 11.5*  --   NEUTROABS 7.6  --   HGB 14.3 13.9  HCT 43.0 41.0  MCV 92.7  --   PLT 195  --    Basic Metabolic Panel:  Recent Labs  Lab 03/23/21 1351 03/23/21 1401  NA 136 138  K 4.8 4.7  CL 102 105  CO2 27  --   GLUCOSE 100* 96  BUN 27* 28*  CREATININE 1.41* 1.40*  CALCIUM 9.4  --    Lipid Panel: No results for input(s): CHOL, TRIG, HDL, CHOLHDL, VLDL, LDLCALC in the last 168 hours. HgbA1c: No results for input(s): HGBA1C in the last 168 hours. Urine Drug Screen:  Recent Labs  Lab 03/23/21 1351  LABOPIA NONE DETECTED  COCAINSCRNUR NONE DETECTED  LABBENZ NONE DETECTED  AMPHETMU NONE DETECTED  THCU NONE DETECTED  LABBARB NONE DETECTED    Alcohol Level  Recent Labs  Lab 03/23/21 1351  ETH <10    IMAGING past 24 hours MR ANGIO HEAD WO CONTRAST  Result Date: 03/23/2021 CLINICAL DATA:  Stroke, follow-up. Additional provided: New onset left leg weakness. EXAM: MRI HEAD WITHOUT CONTRAST MRA HEAD  WITHOUT CONTRAST TECHNIQUE: Multiplanar, multi-echo pulse sequences of the brain and surrounding structures were acquired without intravenous contrast. Angiographic images of the Circle of Willis were acquired using MRA technique without intravenous contrast. COMPARISON: No pertinent prior exam. COMPARISON:  Head CT performed earlier today 03/23/2021. FINDINGS: MRI HEAD FINDINGS Brain: Mild-to-moderate generalized cerebral atrophy. Comparatively mild cerebellar atrophy. Moderate multifocal T2/FLAIR hyperintensity within the cerebral white matter, nonspecific but compatible with chronic small vessel ischemic disease. There is no acute infarct. No evidence of intracranial mass. No chronic intracranial blood products. No extra-axial fluid collection. No midline shift. Vascular: Expected proximal arterial flow voids. Skull and upper cervical spine: No focal marrow lesion. Sinuses/Orbits: Visualized orbits show no acute finding. Mild bilateral ethmoid sinus mucosal thickening. MRA HEAD FINDINGS Anterior circulation: The intracranial internal carotid arteries are patent. The M1 middle cerebral arteries are patent. Atherosclerotic irregularity of the M2 and more distal middle cerebral artery branch vessels. However, there is no proximal M2 branch occlusion or high-grade proximal M2 stenosis. The anterior cerebral arteries are patent. Atherosclerotic irregularity of both anterior cerebral arteries without high-grade proximal stenosis. No intracranial aneurysm is identified. Posterior circulation: The intracranial vertebral arteries are patent. Moderate/advanced stenosis within the V4 right vertebral artery,  proximal to the right PICA origin. The basilar artery is patent. The posterior cerebral arteries are patent without high-grade proximal stenosis. Anatomic variants: Posterior communicating arteries are hypoplastic or absent bilaterally. IMPRESSION: MRI brain: 1. No evidence of acute intracranial abnormality. 2. Moderate  cerebral white matter chronic small vessel ischemic disease. 3. Mild-to-moderate generalized cerebral atrophy with comparatively mild cerebellar atrophy. MRA head: 1. No intracranial large vessel occlusion. 2. 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 3. No other high-grade proximal intracranial arterial stenosis is identified. 4. Atherosclerotic irregularity of the MCA branches and anterior cerebral arteries bilaterally. Electronically Signed   By: Kellie Simmering DO   On: 03/23/2021 18:28   MR BRAIN WO CONTRAST  Result Date: 03/23/2021 CLINICAL DATA:  Stroke, follow-up. Additional provided: New onset left leg weakness. EXAM: MRI HEAD WITHOUT CONTRAST MRA HEAD WITHOUT CONTRAST TECHNIQUE: Multiplanar, multi-echo pulse sequences of the brain and surrounding structures were acquired without intravenous contrast. Angiographic images of the Circle of Willis were acquired using MRA technique without intravenous contrast. COMPARISON: No pertinent prior exam. COMPARISON:  Head CT performed earlier today 03/23/2021. FINDINGS: MRI HEAD FINDINGS Brain: Mild-to-moderate generalized cerebral atrophy. Comparatively mild cerebellar atrophy. Moderate multifocal T2/FLAIR hyperintensity within the cerebral white matter, nonspecific but compatible with chronic small vessel ischemic disease. There is no acute infarct. No evidence of intracranial mass. No chronic intracranial blood products. No extra-axial fluid collection. No midline shift. Vascular: Expected proximal arterial flow voids. Skull and upper cervical spine: No focal marrow lesion. Sinuses/Orbits: Visualized orbits show no acute finding. Mild bilateral ethmoid sinus mucosal thickening. MRA HEAD FINDINGS Anterior circulation: The intracranial internal carotid arteries are patent. The M1 middle cerebral arteries are patent. Atherosclerotic irregularity of the M2 and more distal middle cerebral artery branch vessels.  However, there is no proximal M2 branch occlusion or high-grade proximal M2 stenosis. The anterior cerebral arteries are patent. Atherosclerotic irregularity of both anterior cerebral arteries without high-grade proximal stenosis. No intracranial aneurysm is identified. Posterior circulation: The intracranial vertebral arteries are patent. 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 The basilar artery is patent. The posterior cerebral arteries are patent without high-grade proximal stenosis. Anatomic variants: Posterior communicating arteries are hypoplastic or absent bilaterally. IMPRESSION: MRI brain: 1. No evidence of acute intracranial abnormality. 2. Moderate cerebral white matter chronic small vessel ischemic disease. 3. Mild-to-moderate generalized cerebral atrophy with comparatively mild cerebellar atrophy. MRA head: 1. No intracranial large vessel occlusion. 2. 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 3. No other high-grade proximal intracranial arterial stenosis is identified. 4. Atherosclerotic irregularity of the MCA branches and anterior cerebral arteries bilaterally. Electronically Signed   By: Kellie Simmering DO   On: 03/23/2021 18:28   CT HEAD CODE STROKE WO CONTRAST  Result Date: 03/23/2021 CLINICAL DATA:  Code stroke. EXAM: CT HEAD WITHOUT CONTRAST TECHNIQUE: Contiguous axial images were obtained from the base of the skull through the vertex without intravenous contrast. COMPARISON:  None. FINDINGS: Brain: No acute infarct, hemorrhage, or mass lesion is present. Mild atrophy and white matter changes are present. The ventricles are of normal size. No significant extraaxial fluid collection is present. The brainstem and cerebellum are within normal limits. Vascular: Atherosclerotic calcifications are present within the cavernous internal carotid arteries bilaterally. Calcifications are  present at the dural margin of the vertebral arteries. No hyperdense vessel is present. Skull: Calvarium is intact. No focal lytic  or blastic lesions are present. No significant extracranial soft tissue lesion is present. Sinuses/Orbits: The paranasal sinuses and mastoid air cells are clear. Bilateral lens replacements are noted. Globes and orbits are otherwise unremarkable. ASPECTS [facility] Stroke Program Early CT Score) - Ganglionic level infarction (caudate, lentiform nuclei, internal capsule, insula, M1-M3 cortex): 7/7 - Supraganglionic infarction (M4-M6 cortex): 3/3 Total score (0-10 with 10 being normal): 10/10 IMPRESSION: 1. No acute intracranial abnormality. 2. Mild atrophy and white matter changes, compatible with age 48. ASPECTS is 10/10 The above was relayed via text pager to Dr. Cheral Marker on 03/23/2021 at 14:03 . Electronically Signed   By: San Morelle M.D.   On: 03/23/2021 14:08    PHYSICAL EXAM HEENT: [place]/AT Lungs: Respirations unlabored Ext: No edema   Neurologic Examination: Mental Status: Awake and alert. Fully oriented. Speech is fluent with intact comprehension and naming. No dysarthria. At times becomes emotional during portions of the motor exam, in conjunction with giveway weakness and inconsistencies (could not lift LLE with own power when specifically tested, but then lifted it with what appeared to be normal strength and coordination when transferring from CT table to stretcher, and also with testing of H-S on coordination exam.   Cranial Nerves: II:  Visual fields intact with no extinction to DSS. PERRL.  III,IV, VI: No ptosis. EOMI.   V,VII: Smile symmetric, facial temp sensation equal bilaterally VIII: hearing intact to voice IX,X: No hypophonia XI: Symmetric XII: Midline tongue extension Motor: RUE and RLE: 5/5 LUE 5/5 LLE with variable effort and mild weakness, on double simultaneous testing only.Sensory: Temp and light touch intact throughout, bilaterally. No  extinction to DSS.  Deep Tendon Reflexes:  2+ bilateral brachioradialis, biceps and patellae Plantars: Right: downgoing               Left: downgoing Cerebellar: No ataxia with FNF and H-S bilaterally. Left  H-S was accomplished by examiner placing LLE on RLE and sliding heel up and down right leg, then letting go, after which patient continued with the maneuver several times. Gait: Deferred  ASSESSMENT/PLAN MICHAL CALLICOTT is a 75 y.o. male with a PMHx of atrial fibrillation (on Eliquis), CAD, CHF, rheumatic fever as a child, heart murmur, HLD and HTN, who presents to the ED after experiencing acute onset of LLE weakness at noon. He had been bicycling and afterwards was in his workshop when he experienced sudden loss of movement of his left leg.  Stroke like episode possible TIA consisting of right leg weakness in the setting of known pAF  Code Stroke CT head No acute abnormality.  MRI brain: 1. No evidence of acute intracranial abnormality. 2. Moderate cerebral white matter chronic small vessel ischemic disease. 3. Mild-to-moderate generalized cerebral atrophy with comparatively mild cerebellar atrophy.   MRA head: 1. No intracranial large vessel occlusion. 2. 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 3. No other high-grade proximal intracranial arterial stenosis is identified. 4. Atherosclerotic irregularity of the MCA branches and anterior cerebral arteries bilaterally. Carotid Doppler  Pending 2D Echo Pending LDL 221 HgbA1c 5.5 VTE prophylaxis - on Eliquis     Diet   Diet Heart Room service appropriate? Yes; Fluid consistency: Thin   ON Eliquis prior to admission, continue Eliquis or consider switch to Pradaxa if affordable. Case management can assist with cost evaluation if patient desires to switch.   Therapy recommendations:  Pending Disposition:  TBD  Hypertension Permissive hypertension (OK if < 220/120) but  gradually normalize in 5-7 days Long-term BP goal normotensive  Hyperlipidemia Home meds:  None LDL 221, goal < 70 High intensity statin has not been tolerated with multiple statin allergies listed. Consider Repatha or Praluent as an outpatient.  Other Stroke Risk Factors Advanced Age >/= 99  Former Cigarette smoker Coronary artery disease CHF  Other Active Problems   Hospital day # 0  I have personally obtained history,examined this patient, reviewed notes, independently viewed imaging studies, participated in medical decision making and plan of care.ROS completed by me personally and pertinent positives fully documented  I have made any additions or clarifications directly to the above note. Agree with note above.  Patient presented with sudden onset of left lower leg weakness without accompanying back pain or radicular pain possibly TIA.  MRI scan negative for acute stroke.  MR angiogram shows stenosis of the right V4 segment which is incidental.  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  Continue ongoing stroke work-up.  Continue Eliquis for now and check his co-pay for Pradaxa if it is affordable may consider switching it.  Greater than 50% time during the 35-minute visit was spent in counseling and coordination of care about his leg weakness and atrial fibrillation and stroke risk and answering questions.  Discussed with Dr. Waylan Rocher, MD Medical Director [place] Pager: [phone] 03/24/2021 6:53 PM   To contact Stroke Continuity provider, please refer to [email or web address] After hours, contact General Neurology

## 2021-03-24 NOTE — ED Notes (Signed)
Breakfast orders placed 

## 2021-03-24 NOTE — Progress Notes (Signed)
SLP Cancellation Note  Patient Details Name: Ryan [name] MRN: [id number] DOB: October 12, 1945   Cancelled treatment:       Reason Eval/Treat Not Completed: SLP screened, no needs identified, will sign off. Pt did not present with any acute speech, language, or cognitive-linguistic deficits on admission, MRI was negative for acute changes, and no overt communication or cognitive-linguistic deficits were noted by physical therapy. A formal evaluation does not appear to be clinically indicated at this time. SLP will s/o  Saharra Santo I. Hardin Negus, [place], [place] Office number [phone] Pager [place] 03/24/2021, 5:36 PM

## 2021-03-24 NOTE — Progress Notes (Signed)
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 Patient has chronic radiculopathy and he is following regularly with neurosurgeon and getting local injection every 4 to 6 months. He has received another injection 2 weeks ago.  Patient is admitted for stroke work-up.  CT head negative, patient was not considered a tPA candidate since having history of PAF and on Eliquis.  Assessment & Plan:   Active Problems:   CVA (cerebral vascular accident) ([place])  Left leg paresthesia and paresthesia: Patient presented with acute onset of left lower extremity weakness.   CT head unremarkable.  Admitted for stroke work-up. MRI : No acute intracranial abnormality. No intracranial large vessel occlusion.  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 Continue Eliquis and statins PT and OT evaluation. Neurology follow-up.  Paroxysmal A. Fib: Heart rate is controlled, continue propafenone and beta-blocker. continue Eliquis.  Lymphocytic colitis :  Continue Entocort Patient has been following up with GI.  Hypertension:  controlled,  continue home medications  Hyperlipidemia :  continue statins  CKD stage II : appears euvolemic, avoid nephrotoxic medications.  L4-L5 radiculopathy: Patient might need outpatient MRI lumbar spine and outpatient follow-up neurosurgery.    DVT prophylaxis: Eiquis Code Status: Full code. Family Communication:  No family at bed side. Disposition Plan:   Status is: Observation  The patient remains OBS appropriate and will d/c before 2 midnights.  Dispo: The patient  is from: Home              Anticipated d/c is to: Home              Patient currently is not medically stable to d/c.   Difficult to place patient No  Consultants:  Neurology  Procedures:  CT head, MRI Antimicrobials:   Anti-infectives (From admission, onward)    None        Subjective: Patient was seen and examined at bedside.  Overnight events noted.  Patient reports feeling improved.  Objective: Vitals:   03/24/21 0630 03/24/21 0700 03/24/21 0855 03/24/21 1210  BP: (!) 138/52 (!) 160/56 109/62 (!) 146/72  Pulse: (!) 49 (!) 57 (!) 54 (!) 51  Resp: 16 19 17 16   Temp:   97.6 F (36.4 C) 98.5 F (36.9 C)  TempSrc:   Oral Oral  SpO2: 94% 98% 98% 97%  Weight:       No intake or output data in the 24 hours ending 03/24/21 1224 Filed Weights   03/23/21 1346  Weight: 75.4 kg    Examination:  General exam: Appears calm and comfortable, not in any acute distress. Respiratory system: Clear to auscultation. Respiratory effort normal. Cardiovascular system: S1 & S2 heard, RRR. No JVD, murmurs, rubs, gallops or clicks. No pedal edema. Gastrointestinal system: Abdomen is nondistended, soft and nontender. No organomegaly or masses felt. Normal bowel sounds heard. Central nervous system: Alert and oriented. No focal neurological deficits. Extremities: Symmetric 5 x 5 power.  Left lower extremity weakness 4/5 strength Skin: No rashes, lesions or ulcers Psychiatry: Judgement and insight appear normal. Mood & affect appropriate.     Data Reviewed: I have personally reviewed following labs  and imaging studies  CBC: Recent Labs  Lab 03/23/21 1351 03/23/21 1401  WBC 11.5*  --   NEUTROABS 7.6  --   HGB 14.3 13.9  HCT 43.0 41.0  MCV 92.7  --   PLT 195  --    Basic Metabolic Panel: Recent Labs  Lab 03/23/21 1351 03/23/21 1401  NA 136 138  K 4.8 4.7  CL 102 105  CO2 27  --   GLUCOSE 100* 96  BUN 27* 28*  CREATININE 1.41* 1.40*  CALCIUM 9.4  --    GFR: Estimated  Creatinine Clearance: 43 mL/min (A) (by C-G formula based on SCr of 1.4 mg/dL (H)). Liver Function Tests: Recent Labs  Lab 03/23/21 1351  AST 19  ALT 13  ALKPHOS 60  BILITOT 0.9  PROT 7.0  ALBUMIN 3.6   No results for input(s): LIPASE, AMYLASE in the last 168 hours. No results for input(s): AMMONIA in the last 168 hours. Coagulation Profile: Recent Labs  Lab 03/23/21 1351  INR 1.3*   Cardiac Enzymes: No results for input(s): CKTOTAL, CKMB, CKMBINDEX, TROPONINI in the last 168 hours. BNP (last 3 results) Recent Labs    09/09/20 1400  PROBNP 209.0*   HbA1C: No results for input(s): HGBA1C in the last 72 hours. CBG: Recent Labs  Lab 03/23/21 1401  GLUCAP 93   Lipid Profile: No results for input(s): CHOL, HDL, LDLCALC, TRIG, CHOLHDL, LDLDIRECT in the last 72 hours. Thyroid Function Tests: No results for input(s): TSH, T4TOTAL, FREET4, T3FREE, THYROIDAB in the last 72 hours. Anemia Panel: No results for input(s): VITAMINB12, FOLATE, FERRITIN, TIBC, IRON, RETICCTPCT in the last 72 hours. Sepsis Labs: No results for input(s): PROCALCITON, LATICACIDVEN in the last 168 hours.  Recent Results (from the past 240 hour(s))  Resp Panel by RT-PCR (Flu A&B, Covid) Nasopharyngeal Swab     Status: None   Collection Time: 03/23/21  2:31 PM   Specimen: Nasopharyngeal Swab; Nasopharyngeal(NP) swabs in vial transport medium  Result Value Ref Range Status   SARS Coronavirus 2 by RT PCR NEGATIVE NEGATIVE Final    Comment: (NOTE) SARS-CoV-2 target nucleic acids are NOT DETECTED.  The SARS-CoV-2 RNA is generally detectable in upper respiratory specimens during the acute phase of infection. The lowest concentration of SARS-CoV-2 viral copies this assay can detect is 138 copies/mL. A negative result does not preclude SARS-Cov-2 infection and should not be used as the sole basis for treatment or other patient management decisions. A negative result may occur with  improper specimen  collection/handling, submission of specimen other than nasopharyngeal swab, presence of viral mutation(s) within the areas targeted by this assay, and inadequate number of viral copies(<138 copies/mL). A negative result must be combined with clinical observations, patient history, and epidemiological information. The expected result is Negative.  Fact Sheet for Patients:  EntrepreneurPulse.com.au  Fact Sheet for Healthcare Providers:  IncredibleEmployment.be  This test is no t yet approved or cleared by the [place] FDA and  has been authorized for detection and/or diagnosis of SARS-CoV-2 by FDA under an Emergency Use Authorization (EUA). This EUA will remain  in effect (meaning this test can be used) for the duration of the COVID-19 declaration under Section 564(b)(1) of the Act, 21 U.S.C.section 360bbb-3(b)(1), unless the authorization is terminated  or revoked sooner.       Influenza A by PCR NEGATIVE NEGATIVE Final   Influenza B by PCR NEGATIVE NEGATIVE Final    Comment: (NOTE) The Xpert Xpress SARS-CoV-2/FLU/RSV plus assay is intended as  an aid in the diagnosis of influenza from Nasopharyngeal swab specimens and should not be used as a sole basis for treatment. Nasal washings and aspirates are unacceptable for Xpert Xpress SARS-CoV-2/FLU/RSV testing.  Fact Sheet for Patients: EntrepreneurPulse.com.au  Fact Sheet for Healthcare Providers: IncredibleEmployment.be  This test is not yet approved or cleared by the [place] FDA and has been authorized for detection and/or diagnosis of SARS-CoV-2 by FDA under an Emergency Use Authorization (EUA). This EUA will remain in effect (meaning this test can be used) for the duration of the COVID-19 declaration under Section 564(b)(1) of the Act, 21 U.S.C. section 360bbb-3(b)(1), unless the authorization is terminated or revoked.  Performed at [facility], [place] [address], [place], [place] [address]     Radiology Studies: MR ANGIO HEAD WO CONTRAST  Result Date: 03/23/2021 CLINICAL DATA:  Stroke, follow-up. Additional provided: New onset left leg weakness. EXAM: MRI HEAD WITHOUT CONTRAST MRA HEAD WITHOUT CONTRAST TECHNIQUE: Multiplanar, multi-echo pulse sequences of the brain and surrounding structures were acquired without intravenous contrast. Angiographic images of the Circle of Willis were acquired using MRA technique without intravenous contrast. COMPARISON: No pertinent prior exam. COMPARISON:  Head CT performed earlier today 03/23/2021. FINDINGS: MRI HEAD FINDINGS Brain: Mild-to-moderate generalized cerebral atrophy. Comparatively mild cerebellar atrophy. Moderate multifocal T2/FLAIR hyperintensity within the cerebral white matter, nonspecific but compatible with chronic small vessel ischemic disease. There is no acute infarct. No evidence of intracranial mass. No chronic intracranial blood products. No extra-axial fluid collection. No midline shift. Vascular: Expected proximal arterial flow voids. Skull and upper cervical spine: No focal marrow lesion. Sinuses/Orbits: Visualized orbits show no acute finding. Mild bilateral ethmoid sinus mucosal thickening. MRA HEAD FINDINGS Anterior circulation: The intracranial internal carotid arteries are patent. The M1 middle cerebral arteries are patent. Atherosclerotic irregularity of the M2 and more distal middle cerebral artery branch vessels. However, there is no proximal M2 branch occlusion or high-grade proximal M2 stenosis. The anterior cerebral arteries are patent. Atherosclerotic irregularity of both anterior cerebral arteries without high-grade proximal stenosis. No intracranial aneurysm is identified. Posterior circulation: The intracranial vertebral arteries are patent. 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 The basilar artery is  patent. The posterior cerebral arteries are patent without high-grade proximal stenosis. Anatomic variants: Posterior communicating arteries are hypoplastic or absent bilaterally. IMPRESSION: MRI brain: 1. No evidence of acute intracranial abnormality. 2. Moderate cerebral white matter chronic small vessel ischemic disease. 3. Mild-to-moderate generalized cerebral atrophy with comparatively mild cerebellar atrophy. MRA head: 1. No intracranial large vessel occlusion. 2. 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 3. No other high-grade proximal intracranial arterial stenosis is identified. 4. Atherosclerotic irregularity of the MCA branches and anterior cerebral arteries bilaterally. Electronically Signed   By: Kellie Simmering DO   On: 03/23/2021 18:28   MR BRAIN WO CONTRAST  Result Date: 03/23/2021 CLINICAL DATA:  Stroke, follow-up. Additional provided: New onset left leg weakness. EXAM: MRI HEAD WITHOUT CONTRAST MRA HEAD WITHOUT CONTRAST TECHNIQUE: Multiplanar, multi-echo pulse sequences of the brain and surrounding structures were acquired without intravenous contrast. Angiographic images of the Circle of Willis were acquired using MRA technique without intravenous contrast. COMPARISON: No pertinent prior exam. COMPARISON:  Head CT performed earlier today 03/23/2021. FINDINGS: MRI HEAD FINDINGS Brain: Mild-to-moderate generalized cerebral atrophy. Comparatively mild cerebellar atrophy. Moderate multifocal T2/FLAIR hyperintensity within the cerebral white matter, nonspecific but compatible with chronic small vessel ischemic disease.  There is no acute infarct. No evidence of intracranial mass. No chronic intracranial blood products. No extra-axial fluid collection. No midline shift. Vascular: Expected proximal arterial flow voids. Skull and upper cervical spine: No focal marrow lesion. Sinuses/Orbits: Visualized orbits show no acute finding. Mild bilateral  ethmoid sinus mucosal thickening. MRA HEAD FINDINGS Anterior circulation: The intracranial internal carotid arteries are patent. The M1 middle cerebral arteries are patent. Atherosclerotic irregularity of the M2 and more distal middle cerebral artery branch vessels. However, there is no proximal M2 branch occlusion or high-grade proximal M2 stenosis. The anterior cerebral arteries are patent. Atherosclerotic irregularity of both anterior cerebral arteries without high-grade proximal stenosis. No intracranial aneurysm is identified. Posterior circulation: The intracranial vertebral arteries are patent. 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 The basilar artery is patent. The posterior cerebral arteries are patent without high-grade proximal stenosis. Anatomic variants: Posterior communicating arteries are hypoplastic or absent bilaterally. IMPRESSION: MRI brain: 1. No evidence of acute intracranial abnormality. 2. Moderate cerebral white matter chronic small vessel ischemic disease. 3. Mild-to-moderate generalized cerebral atrophy with comparatively mild cerebellar atrophy. MRA head: 1. No intracranial large vessel occlusion. 2. 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 3. No other high-grade proximal intracranial arterial stenosis is identified. 4. Atherosclerotic irregularity of the MCA branches and anterior cerebral arteries bilaterally. Electronically Signed   By: Kellie Simmering DO   On: 03/23/2021 18:28   CT HEAD CODE STROKE WO CONTRAST  Result Date: 03/23/2021 CLINICAL DATA:  Code stroke. EXAM: CT HEAD WITHOUT CONTRAST TECHNIQUE: Contiguous axial images were obtained from the base of the skull through the vertex without intravenous contrast. COMPARISON:  None. FINDINGS: Brain: No acute infarct, hemorrhage, or mass lesion is present. Mild atrophy and white matter changes are present. The  ventricles are of normal size. No significant extraaxial fluid collection is present. The brainstem and cerebellum are within normal limits. Vascular: Atherosclerotic calcifications are present within the cavernous internal carotid arteries bilaterally. Calcifications are present at the dural margin of the vertebral arteries. No hyperdense vessel is present. Skull: Calvarium is intact. No focal lytic or blastic lesions are present. No significant extracranial soft tissue lesion is present. Sinuses/Orbits: The paranasal sinuses and mastoid air cells are clear. Bilateral lens replacements are noted. Globes and orbits are otherwise unremarkable. ASPECTS [facility] Stroke Program Early CT Score) - Ganglionic level infarction (caudate, lentiform nuclei, internal capsule, insula, M1-M3 cortex): 7/7 - Supraganglionic infarction (M4-M6 cortex): 3/3 Total score (0-10 with 10 being normal): 10/10 IMPRESSION: 1. No acute intracranial abnormality. 2. Mild atrophy and white matter changes, compatible with age 9. ASPECTS is 10/10 The above was relayed via text pager to Dr. Cheral Marker on 03/23/2021 at 14:03 . Electronically Signed   By: San Morelle M.D.   On: 03/23/2021 14:08     Scheduled Meds:   stroke: mapping our early stages of recovery book   Does not apply Once   apixaban  5 mg Oral BID   brimonidine  1 drop Left Eye Q8H   budesonide  9 mg Oral Daily   latanoprost  1 drop Left Eye QHS   melatonin  3 mg Oral QHS   metoprolol tartrate  25 mg Oral BID   pantoprazole  20 mg Oral Daily   propafenone  225 mg Oral BID   tamsulosin  0.4 mg Oral QPM   Continuous Infusions:   LOS: 0 days    Time  spent: 35 mins    Shawna Clamp, MD [facility]   If 7PM-7AM, please contact night-coverage

## 2021-03-24 NOTE — Progress Notes (Signed)
ABI's and Carotid artery duplex has been completed. Preliminary results can be found in CV Proc through chart review.   03/24/21 1:00 PM Carlos Levering RVT

## 2021-03-24 NOTE — TOC Transition Note (Signed)
Transition of Care [facility]) - CM/SW Discharge Note   Patient Details  Name: MOUHAMADOU GITTLEMAN MRN: [id number] Date of Birth: 01/25/1946  [facility] CM/SW Contact:  Pollie Friar, RN Phone Number: 03/24/2021, 4:21 PM   Clinical Narrative:    Recommendations are for outpatient therapy. Pt prefers to attend [facility] PT. CM called the PT office and will fax them the orders.  Pt denies issues with home meds or transportation.  Pt has transport home when medically ready.    Final next level of care: OP Rehab Barriers to Discharge: No Barriers Identified   Patient Goals and CMS Choice     Choice offered to / list presented to : Patient  Discharge Placement                       Discharge Plan and Services                                     Social Determinants of Health (SDOH) Interventions     Readmission Risk Interventions No flowsheet data found.

## 2021-03-24 NOTE — Plan of Care (Signed)
  Problem: Coping: Goal: Will verbalize positive feelings about self Outcome: Progressing   Problem: Education: Goal: Knowledge of disease or condition will improve Outcome: Progressing Goal: Knowledge of secondary prevention will improve Outcome: Progressing Goal: Knowledge of patient specific risk factors addressed and post discharge goals established will improve Outcome: Progressing   Problem: Intracerebral Hemorrhage Tissue Perfusion: Goal: Complications of Intracerebral Hemorrhage will be minimized Outcome: Progressing   Problem: Intracerebral Hemorrhage Tissue Perfusion: Goal: Complications of Intracerebral Hemorrhage will be minimized Outcome: Progressing   Problem: Ischemic Stroke/TIA Tissue Perfusion: Goal: Complications of ischemic stroke/TIA will be minimized Outcome: Progressing

## 2021-03-24 NOTE — Evaluation (Signed)
Physical Therapy Evaluation Patient Details Name: Ryan [name] MRN: [id number] DOB: 03-08-1946 Today's Date: 03/24/2021   History of Present Illness  Pt is a 75 y/o male admitted secondary to LLE weakness. Workup pending. MRI negative for acute abnormality. PMH includes R sciatic pain, CAD, COPD, dCHF, HTN, a fib, and CKD.  Clinical Impression  Pt admitted secondary to problem above with deficits below. Pt requiring min guard A for mobility tasks. Noted mild functional weakness in LLE during mobility. No overt LOB noted. Feel pt would benefit from outpatient PT to address current deficits. Will continue to follow acutely.     Follow Up Recommendations Outpatient PT (wants to go to [facility] PT and see Legrand Como)    Equipment Recommendations  None recommended by PT    Recommendations for Other Services       Precautions / Restrictions Precautions Precautions: Fall Restrictions Weight Bearing Restrictions: No      Mobility  Bed Mobility Overal bed mobility: Needs Assistance Bed Mobility: Supine to Sit;Sit to Supine     Supine to sit: Supervision Sit to supine: Supervision   General bed mobility comments: Supervision for safety    Transfers Overall transfer level: Needs assistance Equipment used: None Transfers: Sit to/from Stand Sit to Stand: Min guard         General transfer comment: Min guard for safety. No physical assist required.  Ambulation/Gait Ambulation/Gait assistance: Min guard Gait Distance (Feet): 150 Feet Assistive device: None Gait Pattern/deviations: Step-through pattern;Decreased stride length;Decreased weight shift to left Gait velocity: Decreased   General Gait Details: Mild functional weakness noted in LLE during ambulation, especially during LLE advancement. Mild unsteadiness noted, but no overt LOB noted. Min guard for safety. Educated about using AD at home to increase safety.  Stairs Stairs: Yes Stairs assistance: Min guard Stair  Management: One rail Right;Step to pattern;Forwards Number of Stairs: 4 General stair comments: Cues for LE sequencing and using step to pattern. No overt LOB noted.  Wheelchair Mobility    Modified Rankin (Stroke Patients Only)       Balance Overall balance assessment: Needs assistance Sitting-balance support: No upper extremity supported;Feet supported Sitting balance-Leahy Scale: Good     Standing balance support: No upper extremity supported;During functional activity Standing balance-Leahy Scale: Fair                               Pertinent Vitals/Pain Pain Assessment: No/denies pain    Home Living Family/patient expects to be discharged to:: Private residence Living Arrangements: Spouse/significant other Available Help at Discharge: Family;Available 24 hours/day Type of Home: House Home Access: Stairs to enter Entrance Stairs-Rails: Right Entrance Stairs-Number of Steps: 3 Home Layout: Multi-level;Laundry or work area in basement (work area Engineer, civil (consulting)) Actor: Environmental consultant - 2 wheels;Walker - standard;Cane - single point;Cane - quad;Bedside commode;Shower seat - built in;Grab bars - tub/shower      Prior Function Level of Independence: Independent               Hand Dominance   Dominant Hand: Right    Extremity/Trunk Assessment   Upper Extremity Assessment Upper Extremity Assessment: Defer to OT evaluation    Lower Extremity Assessment Lower Extremity Assessment: LLE deficits/detail LLE Deficits / Details: Grossly 4/5 throughout    Cervical / Trunk Assessment Cervical / Trunk Assessment: Other exceptions Cervical / Trunk Exceptions: Lumbar issues at baseline  Communication   Communication: No difficulties  Cognition Arousal/Alertness: Awake/alert Behavior During Therapy:  WFL for tasks assessed/performed Overall Cognitive Status: Within Functional Limits for tasks assessed                                         General Comments General comments (skin integrity, edema, etc.): Pt's wife prese\nt    Exercises     Assessment/Plan    PT Assessment Patient needs continued PT services  PT Problem List Decreased strength;Decreased balance;Decreased mobility       PT Treatment Interventions Gait training;DME instruction;Stair training;Therapeutic activities;Functional mobility training;Therapeutic exercise;Balance training;Patient/family education    PT Goals (Current goals can be found in the Care Plan section)  Acute Rehab PT Goals Patient Stated Goal: to go home PT Goal Formulation: With patient Time For Goal Achievement: 04/07/21 Potential to Achieve Goals: Good    Frequency Min 3X/week   Barriers to discharge        Co-evaluation               AM-PAC PT "6 Clicks" Mobility  Outcome Measure Help needed turning from your back to your side while in a flat bed without using bedrails?: None Help needed moving from lying on your back to sitting on the side of a flat bed without using bedrails?: None Help needed moving to and from a bed to a chair (including a wheelchair)?: A Little Help needed standing up from a chair using your arms (e.g., wheelchair or bedside chair)?: A Little Help needed to walk in hospital room?: A Little Help needed climbing 3-5 steps with a railing? : A Little 6 Click Score: 20    End of Session Equipment Utilized During Treatment: Gait belt Activity Tolerance: Patient tolerated treatment well Patient left: in bed;with call bell/phone within reach;Other (comment) (with vascular staff present) Nurse Communication: Mobility status PT Visit Diagnosis: Unsteadiness on feet (R26.81);Muscle weakness (generalized) (M62.81)    Time: 7026-3785 PT Time Calculation (min) (ACUTE ONLY): 18 min   Charges:   PT Evaluation $PT Eval Low Complexity: 1 Low          Lou Miner, DPT  Acute Rehabilitation Services  Pager: [phone] Office: [phone]   Rudean Hitt 03/24/2021, 3:57 PM

## 2021-03-24 NOTE — Care Management Obs Status (Signed)
[facility] NOTIFICATION   Patient Details  Name: Ryan [name] MRN: [id number] Date of Birth: [birth year]   Medicare Observation Status Notification Given:  Yes    Pollie Friar, RN 03/24/2021, 3:29 PM

## 2021-03-24 NOTE — Telephone Encounter (Signed)
Rx(s) sent to pharmacy electronically.  

## 2021-03-25 ENCOUNTER — Inpatient Hospital Stay (HOSPITAL_COMMUNITY): Payer: Medicare Other

## 2021-03-25 DIAGNOSIS — I6300 Cerebral infarction due to thrombosis of unspecified precerebral artery: Secondary | ICD-10-CM | POA: Diagnosis not present

## 2021-03-25 DIAGNOSIS — G459 Transient cerebral ischemic attack, unspecified: Secondary | ICD-10-CM | POA: Diagnosis not present

## 2021-03-25 DIAGNOSIS — R2990 Unspecified symptoms and signs involving the nervous system: Secondary | ICD-10-CM | POA: Diagnosis not present

## 2021-03-25 LAB — CBC
HCT: 38.8 % — ABNORMAL LOW (ref 39.0–52.0)
Hemoglobin: 13 g/dL (ref 13.0–17.0)
MCH: 30.4 pg (ref 26.0–34.0)
MCHC: 33.5 g/dL (ref 30.0–36.0)
MCV: 90.7 fL (ref 80.0–100.0)
Platelets: 181 10*3/uL (ref 150–400)
RBC: 4.28 MIL/uL (ref 4.22–5.81)
RDW: 13.2 % (ref 11.5–15.5)
WBC: 12.3 10*3/uL — ABNORMAL HIGH (ref 4.0–10.5)
nRBC: 0 % (ref 0.0–0.2)

## 2021-03-25 LAB — BASIC METABOLIC PANEL
Anion gap: 9 (ref 5–15)
BUN: 25 mg/dL — ABNORMAL HIGH (ref 8–23)
CO2: 26 mmol/L (ref 22–32)
Calcium: 9.5 mg/dL (ref 8.9–10.3)
Chloride: 100 mmol/L (ref 98–111)
Creatinine, Ser: 1.44 mg/dL — ABNORMAL HIGH (ref 0.61–1.24)
GFR, Estimated: 51 mL/min — ABNORMAL LOW (ref >60)
Glucose, Bld: 91 mg/dL (ref 70–99)
Potassium: 4.3 mmol/L (ref 3.5–5.1)
Sodium: 135 mmol/L (ref 135–145)

## 2021-03-25 LAB — ECHOCARDIOGRAM COMPLETE
Area-P 1/2: 2.62 cm2
S' Lateral: 3.7 cm
Weight: 2659.63 oz

## 2021-03-25 LAB — PHOSPHORUS: Phosphorus: 3.9 mg/dL (ref 2.5–4.6)

## 2021-03-25 LAB — MAGNESIUM: Magnesium: 1.7 mg/dL (ref 1.7–2.4)

## 2021-03-25 LAB — HEMOGLOBIN A1C
Hgb A1c MFr Bld: 5.5 % (ref 4.8–5.6)
Mean Plasma Glucose: 111 mg/dL

## 2021-03-25 MED ORDER — DABIGATRAN ETEXILATE MESYLATE 150 MG PO CAPS
150.0000 mg | ORAL_CAPSULE | Freq: Two times a day (BID) | ORAL | 3 refills | Status: DC
Start: 2021-03-25 — End: 2021-05-16

## 2021-03-25 MED ORDER — METOPROLOL TARTRATE 25 MG PO TABS: 25.0000 mg | ORAL_TABLET | Freq: Two times a day (BID) | ORAL | 3 refills | Status: DC

## 2021-03-25 NOTE — Progress Notes (Signed)
  Echocardiogram 2D Echocardiogram has been performed.  Ryan [name] 03/25/2021, 2:43 PM

## 2021-03-25 NOTE — Plan of Care (Signed)
  Problem: Acute Rehab PT Goals(only PT should resolve) Goal: Patient Will Transfer Sit To/From Stand Outcome: Adequate for Discharge Goal: Pt Will Ambulate Outcome: Adequate for Discharge Goal: Pt Will Go Up/Down Stairs Outcome: Adequate for Discharge   Problem: Coping: Goal: Will verbalize positive feelings about self 03/25/2021 1148 by Emmaline Life, RN Outcome: Adequate for Discharge 03/25/2021 1148 by Emmaline Life, RN Outcome: Adequate for Discharge   Problem: Self-Care: Goal: Ability to participate in self-care as condition permits will improve 03/25/2021 1148 by Emmaline Life, RN Outcome: Adequate for Discharge 03/25/2021 1148 by Emmaline Life, RN Outcome: Adequate for Discharge Goal: Verbalization of feelings and concerns over difficulty with self-care will improve 03/25/2021 1148 by Emmaline Life, RN Outcome: Adequate for Discharge 03/25/2021 1148 by Emmaline Life, RN Outcome: Adequate for Discharge   Problem: Intracerebral Hemorrhage Tissue Perfusion: Goal: Complications of Intracerebral Hemorrhage will be minimized 03/25/2021 1148 by Emmaline Life, RN Outcome: Adequate for Discharge 03/25/2021 1148 by Emmaline Life, RN Outcome: Adequate for Discharge   Problem: Ischemic Stroke/TIA Tissue Perfusion: Goal: Complications of ischemic stroke/TIA will be minimized 03/25/2021 1148 by Emmaline Life, RN Outcome: Adequate for Discharge 03/25/2021 1148 by Emmaline Life, RN Outcome: Adequate for Discharge

## 2021-03-25 NOTE — Progress Notes (Signed)
STROKE TEAM PROGRESS NOTE   INTERVAL HISTORY No acute events. He has no new complaints.  Neurological exam is unchanged.  Vital signs are stable.  2D echo is pending.  He can afford the co-pay for Pradaxa hence Eliquis will be switched to Pradaxa  Vitals:   03/25/21 0007 03/25/21 0400 03/25/21 0736 03/25/21 1139  BP: 126/63 (!) 131/58 138/81 125/70  Pulse: 61 (!) 46 (!) 54 (!) 49  Resp: 17 16 16 18   Temp: 98.6 F (37 C) 98.4 F (36.9 C) 97.9 F (36.6 C) 97.8 F (36.6 C)  TempSrc: Temporal Temporal Oral Oral  SpO2: 99% 100% 97% 98%  Weight:       CBC:  Recent Labs  Lab 03/23/21 1351 03/23/21 1401 03/25/21 0300  WBC 11.5*  --  12.3*  NEUTROABS 7.6  --   --   HGB 14.3 13.9 13.0  HCT 43.0 41.0 38.8*  MCV 92.7  --  90.7  PLT 195  --  161   Basic Metabolic Panel:  Recent Labs  Lab 03/23/21 1351 03/23/21 1401 03/25/21 0300  NA 136 138 135  K 4.8 4.7 4.3  CL 102 105 100  CO2 27  --  26  GLUCOSE 100* 96 91  BUN 27* 28* 25*  CREATININE 1.41* 1.40* 1.44*  CALCIUM 9.4  --  9.5  MG  --   --  1.7  PHOS  --   --  3.9   Lipid Panel:  Recent Labs  Lab 03/24/21 1215  CHOL 289*  TRIG 80  HDL 51  CHOLHDL 5.7  VLDL 16  LDLCALC 222*   HgbA1c:  Recent Labs  Lab 03/24/21 1215  HGBA1C 5.5   Urine Drug Screen:  Recent Labs  Lab 03/23/21 1351  LABOPIA NONE DETECTED  COCAINSCRNUR NONE DETECTED  LABBENZ NONE DETECTED  AMPHETMU NONE DETECTED  THCU NONE DETECTED  LABBARB NONE DETECTED    Alcohol Level  Recent Labs  Lab 03/23/21 1351  ETH <10    IMAGING past 24 hours VAS US CAROTID  Result Date: 03/24/2021 Carotid Arterial Duplex Study Patient Name:  NED KAKAR  Date of Exam:   03/24/2021 Medical Rec #: [id number]       Accession #:    [id number] Date of Birth: 09/11/1946       Patient Gender: M Patient Age:   75Y Exam Location:  [facility] Procedure:      VAS US CAROTID Referring Phys: [id number] [name]  --------------------------------------------------------------------------------  Indications:       CVA. Risk Factors:      Hypertension, hyperlipidemia, prior CVA. Comparison Study:  No prior studies. Performing Technologist: Oliver Hum RVT  Examination Guidelines: A complete evaluation includes B-mode imaging, spectral Doppler, color Doppler, and power Doppler as needed of all accessible portions of each vessel. Bilateral testing is considered an integral part of a complete examination. Limited examinations for reoccurring indications may be performed as noted.  Right Carotid Findings: +----------+--------+--------+--------+-----------------------+--------+           PSV cm/sEDV cm/sStenosisPlaque Description     Comments +----------+--------+--------+--------+-----------------------+--------+ CCA Prox  89      10              smooth and heterogenous         +----------+--------+--------+--------+-----------------------+--------+ CCA Distal86      8               smooth and heterogenous         +----------+--------+--------+--------+-----------------------+--------+  ICA Prox  52      13              smooth and heterogenous         +----------+--------+--------+--------+-----------------------+--------+ ICA Distal50      12                                     tortuous +----------+--------+--------+--------+-----------------------+--------+ ECA       133     7                                               +----------+--------+--------+--------+-----------------------+--------+ +----------+--------+-------+--------+-------------------+           PSV cm/sEDV cmsDescribeArm Pressure (mmHG) +----------+--------+-------+--------+-------------------+ [id number]                                        +----------+--------+-------+--------+-------------------+ +---------+--------+--+--------+-+---------+ VertebralPSV cm/s45EDV cm/s8Antegrade  +---------+--------+--+--------+-+---------+  Left Carotid Findings: +----------+--------+--------+--------+-----------------------+--------+           PSV cm/sEDV cm/sStenosisPlaque Description     Comments +----------+--------+--------+--------+-----------------------+--------+ CCA Prox  135     13              smooth and heterogenous         +----------+--------+--------+--------+-----------------------+--------+ CCA Distal90      18              smooth and heterogenous         +----------+--------+--------+--------+-----------------------+--------+ ICA Prox  121     25              smooth and heterogenoustortuous +----------+--------+--------+--------+-----------------------+--------+ ICA Distal131     25                                     tortuous +----------+--------+--------+--------+-----------------------+--------+ ECA       232     12                                              +----------+--------+--------+--------+-----------------------+--------+ +----------+--------+--------+--------+-------------------+           PSV cm/sEDV cm/sDescribeArm Pressure (mmHG) +----------+--------+--------+--------+-------------------+ Subclavian204                                         +----------+--------+--------+--------+-------------------+ +---------+--------+--+--------+--+---------+ VertebralPSV cm/s73EDV cm/s10Antegrade +---------+--------+--+--------+--+---------+   Summary: Right Carotid: Velocities in the right ICA are consistent with a 1-39% stenosis. Left Carotid: Velocities in the left ICA are consistent with a 1-39% stenosis. Vertebrals: Bilateral vertebral arteries demonstrate antegrade flow. *See table(s) above for measurements and observations.  Electronically signed by Ruta Hinds MD on 03/24/2021 at 6:42:20 PM.    Final     PHYSICAL EXAM HEENT: [place]/AT Lungs: Respirations unlabored Ext: No edema   Neurologic Examination: Mental  Status: Awake and alert. Fully oriented. Speech is fluent with intact comprehension and naming. No dysarthria. At times becomes emotional during portions of the motor exam, in conjunction with giveway  weakness and inconsistencies (could not lift LLE with own power when specifically tested, but then lifted it with what appeared to be normal strength and coordination when transferring from CT table to stretcher, and also with testing of H-S on coordination exam.   Cranial Nerves: II:  Visual fields intact with no extinction to DSS. PERRL.  III,IV, VI: No ptosis. EOMI.   V,VII: Smile symmetric, facial temp sensation equal bilaterally VIII: hearing intact to voice IX,X: No hypophonia XI: Symmetric XII: Midline tongue extension Motor: RUE and RLE: 5/5 LUE 5/5 LLE with variable effort and mild weakness, on double simultaneous testing only.Sensory: Temp and light touch intact throughout, bilaterally. No extinction to DSS.  Deep Tendon Reflexes:  2+ bilateral brachioradialis, biceps and patellae Plantars: Right: downgoing               Left: downgoing Cerebellar: No ataxia with FNF and H-S bilaterally. Left  H-S was accomplished by examiner placing LLE on RLE and sliding heel up and down right leg, then letting go, after which patient continued with the maneuver several times. Gait: Deferred  ASSESSMENT/PLAN ERVEN RAMSON is a 75 y.o. male with a PMHx of atrial fibrillation (on Eliquis), CAD, CHF, rheumatic fever as a child, heart murmur, HLD and HTN, who presents to the ED after experiencing acute onset of LLE weakness at noon. He had been bicycling and afterwards was in his workshop when he experienced sudden loss of movement of his left leg.  Stroke like episode possible TIA consisting of right leg weakness in the setting of known pAF  Code Stroke CT head No acute abnormality.  MRI brain: 1. No evidence of acute intracranial abnormality. 2. 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 3. Mild-to-moderate generalized cerebral atrophy with comparatively mild cerebellar atrophy.   MRA head: 1. No intracranial large vessel occlusion. 2. 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 3. No other high-grade proximal intracranial arterial stenosis is identified. 4. Atherosclerotic irregularity of the MCA branches and anterior cerebral arteries bilaterally. Carotid Doppler bilateral 1-39% carotid stenosis.   2D Echo Pending LDL 221 HgbA1c 5.5 VTE prophylaxis - on Eliquis     Diet   Diet Heart Room service appropriate? Yes; Fluid consistency: Thin   ON Eliquis prior to admission, continue Eliquis or consider switch to Pradaxa if affordable. Case management can assist with cost evaluation if patient desires to switch.   Therapy recommendations:   None Disposition: Home Hypertension Permissive hypertension (OK if < 220/120) but gradually normalize in 5-7 days Long-term BP goal normotensive  Hyperlipidemia Home meds:  None LDL 221, goal < 70 High intensity statin has not been tolerated with multiple statin allergies listed. Consider Repatha or Praluent as an outpatient.  Other Stroke Risk Factors Advanced Age >/= 81  Former Cigarette smoker Coronary artery disease CHF  Other Active Problems   Hospital day # 1  Recommend switch Eliquis to  Pradaxa as patient cannot afford the co-pay follow-up as an outpatient stroke clinic in 6 weeks.  Stroke team will sign off.  Kindly call for questions..  Discussed with Dr. Waylan Rocher, MD Medical Director [place] Pager: [phone] 03/25/2021 2:49 PM   To contact Stroke Continuity provider, please refer to [email or web address] After hours, contact General Neurology

## 2021-03-25 NOTE — Evaluation (Signed)
Occupational Therapy Evaluation/Discharge Patient Details Name: Ryan [name] MRN: [id number] DOB: 1946-05-15 Today's Date: 03/25/2021    History of Present Illness Pt is a 75 y/o male admitted secondary to LLE weakness. MRI negative for acute abnormality. PMH includes R sciatic pain, CAD, COPD, dCHF, HTN, a fib, and CKD.   Clinical Impression   PTA, pt was independent and lived with his wife. Currently, pt functioning at Mod I level for all ADLs and functional mobility. Pt correctly answering delayed memory recall task, and simple money management question. Pt functionally presenting near baseline. All questions answered. Recommend discharge home with no follow up OT at this time. Re-consult if change in status. OT to sign off.      Follow Up Recommendations  No OT follow up    Equipment Recommendations  None recommended by OT    Recommendations for Other Services       Precautions / Restrictions Precautions Precautions: Fall Restrictions Weight Bearing Restrictions: No      Mobility Bed Mobility               General bed mobility comments: Pt sitting in recliner upon arrival    Transfers Overall transfer level: Modified independent Equipment used: None Transfers: Sit to/from Stand Sit to Stand: Modified independent (Device/Increase time)         General transfer comment: Mod I for increased time.    Balance Overall balance assessment: Modified Independent                                         ADL either performed or assessed with clinical judgement   ADL Overall ADL's : Modified independent                                       General ADL Comments: Pt performing ADLs and functional mobility at Mod I level for increased time. Pt performing walk in shower transfer and tub transfer with mod I.     Vision Baseline Vision/History: Wears glasses Wears Glasses: At all times Patient Visual Report: No change from  baseline Vision Assessment?: No apparent visual deficits Additional Comments: Pt reporting family history of vision problems, but no change in vision with current event.     Perception Perception Perception Tested?: No Comments: no apprent difficulty with perception   Praxis Praxis Praxis tested?: Not tested Praxis-Other Comments: no apparent difficulty with motor planning    Pertinent Vitals/Pain Pain Assessment: No/denies pain     Hand Dominance Right   Extremity/Trunk Assessment Upper Extremity Assessment Upper Extremity Assessment: Overall WFL for tasks assessed (No differences in RUE and LUE strength or coordination noted.)   Lower Extremity Assessment Lower Extremity Assessment: Defer to PT evaluation   Cervical / Trunk Assessment Cervical / Trunk Assessment: Other exceptions Cervical / Trunk Exceptions: Lumbar issues at baseline   Communication Communication Communication: No difficulties   Cognition Arousal/Alertness: Awake/alert Behavior During Therapy: WFL for tasks assessed/performed Overall Cognitive Status: Within Functional Limits for tasks assessed                                 General Comments: Pt pleasant and conversational throughout. Feels he is functioning at baseline. Pt correctly answering 3 word delayed memory recall question  and simple money management question.   General Comments  Pt wife and sister in law present    Exercises     Shoulder Instructions      Home Living Family/patient expects to be discharged to:: Private residence Living Arrangements: Spouse/significant other Available Help at Discharge: Family;Available 24 hours/day Type of Home: House Home Access: Stairs to enter [employer] of Steps: 3 Entrance Stairs-Rails: Right Home Layout: Multi-level;Laundry or work area in basement (work area Engineer, civil (consulting)) Alternate Therapist, sports of Steps: flight Alternate Level Stairs-Rails: Left Bathroom  Shower/Tub: Occupational psychologist: Handicapped height Bathroom Accessibility: Yes   Home Equipment: Environmental consultant - 2 wheels;Walker - standard;Cane - single point;Cane - quad;Bedside commode;Shower seat - built in;Grab bars - tub/shower   Additional Comments: lives with wife who can assist as needed.  Sister in law will also be able to assist as needed       Prior Functioning/Environment Level of Independence: Independent                 OT Problem List: Decreased strength;Decreased activity tolerance;Impaired balance (sitting and/or standing)      OT Treatment/Interventions:      OT Goals(Current goals can be found in the care plan section) Acute Rehab OT Goals Patient Stated Goal: "go home today" OT Goal Formulation: With patient/family  OT Frequency:     Barriers to D/C:            Co-evaluation              AM-PAC OT "6 Clicks" Daily Activity     Outcome Measure Help from another person eating meals?: None Help from another person taking care of personal grooming?: None Help from another person toileting, which includes using toliet, bedpan, or urinal?: None Help from another person bathing (including washing, rinsing, drying)?: None Help from another person to put on and taking off regular upper body clothing?: None Help from another person to put on and taking off regular lower body clothing?: None 6 Click Score: 24   End of Session Equipment Utilized During Treatment: Gait belt Nurse Communication: Mobility status  Activity Tolerance: Patient tolerated treatment well Patient left: in chair;with call bell/phone within reach;with family/visitor present  OT Visit Diagnosis: Unsteadiness on feet (R26.81);Muscle weakness (generalized) (M62.81)                Time: 4650-3546 OT Time Calculation (min): 18 min Charges:  OT General Charges $OT Visit: 1 Visit OT Evaluation $OT Eval Low Complexity: 1 Low  Shanda Howells, OTDS   Shanda Howells 03/25/2021, 10:12 AM

## 2021-03-25 NOTE — Discharge Summary (Addendum)
Physician Discharge Summary  Ryan [name] PNT:[id number] DOB: August 31, 1946 DOA: 03/23/2021  PCP: [name], MD  Admit date: 03/23/2021.  Discharge date: 03/25/2021.  Admitted From: Home.  Disposition:  Home.  Recommendations for Outpatient Follow-up:  Follow up with PCP in 1-2 weeks. Please obtain BMP/CBC in one week. Advised to follow-up with neurology in 6 weeks. Advised to take Pradaxa 150 mg twice daily for atrial fibrillation for stroke prevention. Advised to hold metoprolol for couple of days and resume. Advised Repatha as an outpatient, has statin tolerance in the past.  Home Health:None Equipment/Devices:None  Discharge Condition: Good CODE STATUS:Full code Diet recommendation: Heart Healthy   Brief 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 Patient has chronic radiculopathy and he is following regularly with neurosurgeon and getting local injection every 4 to 6 months. He has received another injection 2 weeks ago. Patient was admitted for stroke work-up.  CT head negative, patient was not considered a tPA candidate since having history of PAF and on Eliquis. 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  Neurology recommended switching Eliquis to Pradaxa if patient can afford it.  Eliquis was transitioned to Pradaxa 150 mg twice daily.  Echocardiogram shows LVEF 50 to 55%,  mild left ventricular dysfunction, no regional wall motion abnormalities.  Patient is cleared from neurology to be discharged.  Patient will follow up with neurology in 6 weeks.  PT recommended  outpatient PT.  He was managed for below problems.  Discharge Diagnoses:  Active Problems:   CVA (cerebral vascular accident) ([place])  Left leg paresthesia and paresthesia: Patient presented with acute onset of left lower extremity weakness.   CT head unremarkable.  Admitted for stroke work-up. MRI : No acute intracranial abnormality. No intracranial large vessel occlusion. 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 Continue Eliquis and statins PT and OT evaluation. Neurology recommended Eliquis can be transitioned to Pradaxa if patient could afford it. Echocardiogram : mild left ventricular dysfunction otherwise unremarkable. Patient cleared from neurology to be discharged with outpatient follow-up in 6 weeks. Eliquis transitioned to Pradaxa.   Paroxysmal A. Fib: Heart rate is controlled, continue propafenone and beta-blocker. Eliquis transitioned to Pradaxa as per neurology.   Lymphocytic colitis : Continue Entocort Patient has been following up with GI.   Hypertension:  controlled,  continue home medications   Hyperlipidemia :  continue statins   CKD stage II : appears euvolemic, avoid nephrotoxic medications.   L4-L5 radiculopathy: Patient might need outpatient MRI lumbar spine and outpatient follow-up neurosurgery.      Discharge Instructions  Discharge Instructions     Ambulatory referral to Physical Therapy   Complete by: As directed    Call MD for:  difficulty breathing, headache or visual disturbances   Complete by: As directed    Call MD for:  persistant dizziness or light-headedness   Complete by: As directed    Call MD for:  persistant nausea and vomiting   Complete by: As directed    Diet - low sodium heart healthy   Complete by: As directed    Diet Carb Modified  Complete by: As directed    Discharge instructions   Complete by: As directed    Advised to follow-up with primary care physician in  1 week. Advised to follow-up with neurology in 4 weeks. Advised to take Pradaxa 150 mg twice daily for atrial fibrillation for stroke prevention. Advised to hold metoprolol for couple of days and resume.   Increase activity slowly   Complete by: As directed       Allergies as of 03/25/2021       Reactions   Albumin (human) Anaphylaxis   Polymyxin B-trimethoprim Swelling   Eye drops made eyes swell   Pseudoephedrine Other (See Comments)   Stomach cramps   Codeine Hives, Itching, Rash   Guaiacol Other (See Comments)   Hallucinations   Statins Other (See Comments)   Muscle cramps   Gabapentin Other (See Comments)   unk   Meloxicam Other (See Comments)   Unknown reaction   Pseudoephedrine-guaifenesin Nausea And Vomiting   Stomach cramps   Rosuvastatin Calcium Other (See Comments)   Muscle aches   Tapentadol Other (See Comments)   Unknown reaction   Ciprofloxacin Hives, Itching, Nausea Only, Rash   Moxifloxacin Nausea Only, Other (See Comments)   Headaches, stomach cramps   Rofecoxib Other (See Comments)   Stomach cramping        Medication List     STOP taking these medications    cholestyramine 4 g packet Commonly known as: QUESTRAN   diphenoxylate-atropine 2.5-0.025 MG tablet Commonly known as: Lomotil   Eliquis 5 MG Tabs tablet Generic drug: apixaban   fluconazole 100 MG tablet Commonly known as: DIFLUCAN       TAKE these medications    amoxicillin 500 MG capsule Commonly known as: AMOXIL Take 500 mg by mouth once as needed (dentist appointments).   brimonidine 0.2 % ophthalmic solution Commonly known as: ALPHAGAN Place 1 drop into the left eye in the morning and at bedtime.   budesonide 3 MG 24 hr capsule Commonly known as: ENTOCORT EC Take 1 capsule (3 mg total) by mouth in the morning, at noon, and at bedtime. What changed: Another medication with the same name was removed. Continue taking this medication, and follow the directions you see  here.   clobetasol 0.05 % external solution Commonly known as: TEMOVATE APPLY 1 APPLICATION TOPICALLY TWICE DAILY What changed:  how much to take how to take this when to take this reasons to take this additional instructions   dabigatran 150 MG Caps capsule Commonly known as: Pradaxa Take 1 capsule (150 mg total) by mouth 2 (two) times daily.   furosemide 20 MG tablet Commonly known as: LASIX Take 1 tablet (20 mg total) by mouth daily.   hydrocortisone 2.5 % lotion Apply 1 application topically 2 (two) times daily as needed (dry skin).   ICaps MV Tabs Take 1 tablet by mouth 2 (two) times daily.   latanoprost 0.005 % ophthalmic solution Commonly known as: XALATAN Place 1 drop into the left eye at bedtime.   levalbuterol 45 MCG/ACT inhaler Commonly known as: XOPENEX HFA Inhale 2 puffs into the lungs every 4 (four) hours as needed for wheezing.   LUCENTIS IO Inject 1 Dose into the eye once as needed (Macular degeneration).   magnesium oxide 400 (240 Mg) MG tablet Commonly known as: MAG-OX TAKE 1 TABLET(400 MG) BY MOUTH THREE TIMES DAILY AS NEEDED What changed: See the new instructions.   Melatonin 3 MG Caps Take 3 mg by mouth at bedtime.  metoprolol tartrate 25 MG tablet Commonly known as: LOPRESSOR Take 1 tablet (25 mg total) by mouth 2 (two) times daily.   nitroGLYCERIN 0.4 MG SL tablet Commonly known as: NITROSTAT Place 1 tablet (0.4 mg total) under the tongue every 5 (five) minutes as needed for chest pain.   omeprazole 20 MG tablet Commonly known as: PRILOSEC OTC Take 20 mg by mouth daily.   potassium chloride 10 MEQ tablet Commonly known as: KLOR-CON Take 1 tablet (10 mEq total) by mouth daily.   propafenone 225 MG tablet Commonly known as: RYTHMOL TAKE 1 TABLET BY MOUTH  TWICE DAILY What changed: when to take this   tamsulosin 0.4 MG Caps capsule Commonly known as: FLOMAX Take 0.4 mg by mouth every evening.   Vitamin D3 125 MCG (5000 UT)  Caps Take 10,000 Units by mouth every other day.        Follow-up [facility] Follow up.   Why: The outpatient therapy will contact you for the first appointment. Contact information: [address] [place], [address], [place] # [place], [phone]        [name], MD Follow up in 1 week(s).   Specialty: Family Medicine Contact information: [address] [place] [address] [phone]         Evans Lance, MD .   Specialty: Cardiology Contact information: [id number] [address] [phone]         [place]. Follow up in 1 month(s).   Contact information: [address] [place] [address] [phone]                Allergies  Allergen Reactions   Albumin (Human) Anaphylaxis   Polymyxin B-Trimethoprim Swelling    Eye drops made eyes swell   Pseudoephedrine Other (See Comments)    Stomach cramps   Codeine Hives, Itching and Rash   Guaiacol Other (See Comments)    Hallucinations   Statins Other (See Comments)    Muscle cramps    Gabapentin Other (See Comments)    unk   Meloxicam Other (See Comments)    Unknown reaction   Pseudoephedrine-Guaifenesin Nausea And Vomiting    Stomach cramps   Rosuvastatin Calcium Other (See Comments)    Muscle aches   Tapentadol Other (See Comments)    Unknown reaction   Ciprofloxacin Hives, Itching, Nausea Only and Rash   Moxifloxacin Nausea Only and Other (See Comments)    Headaches, stomach cramps    Rofecoxib Other (See Comments)    Stomach cramping    Consultations: Neurology   Procedures/Studies: MR ANGIO HEAD WO CONTRAST  Result Date: 03/23/2021 CLINICAL DATA:  Stroke, follow-up. Additional provided: New onset left leg weakness. EXAM: MRI HEAD WITHOUT CONTRAST MRA HEAD WITHOUT CONTRAST TECHNIQUE: Multiplanar, multi-echo pulse sequences of the brain and surrounding structures were  acquired without intravenous contrast. Angiographic images of the Circle of Willis were acquired using MRA technique without intravenous contrast. COMPARISON: No pertinent prior exam. COMPARISON:  Head CT performed earlier today 03/23/2021. FINDINGS: MRI HEAD FINDINGS Brain: Mild-to-moderate generalized cerebral atrophy. Comparatively mild cerebellar atrophy. Moderate multifocal T2/FLAIR hyperintensity within the cerebral white matter, nonspecific but compatible with chronic small vessel ischemic disease. There is no acute infarct. No evidence of intracranial mass. No chronic intracranial blood products. No extra-axial fluid collection. No midline shift. Vascular: Expected proximal arterial flow voids. Skull and upper  cervical spine: No focal marrow lesion. Sinuses/Orbits: Visualized orbits show no acute finding. Mild bilateral ethmoid sinus mucosal thickening. MRA HEAD FINDINGS Anterior circulation: The intracranial internal carotid arteries are patent. The M1 middle cerebral arteries are patent. Atherosclerotic irregularity of the M2 and more distal middle cerebral artery branch vessels. However, there is no proximal M2 branch occlusion or high-grade proximal M2 stenosis. The anterior cerebral arteries are patent. Atherosclerotic irregularity of both anterior cerebral arteries without high-grade proximal stenosis. No intracranial aneurysm is identified. Posterior circulation: The intracranial vertebral arteries are patent. 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 The basilar artery is patent. The posterior cerebral arteries are patent without high-grade proximal stenosis. Anatomic variants: Posterior communicating arteries are hypoplastic or absent bilaterally. IMPRESSION: MRI brain: 1. No evidence of acute intracranial abnormality. 2. Moderate cerebral white matter chronic small vessel ischemic disease. 3. Mild-to-moderate generalized cerebral atrophy  with comparatively mild cerebellar atrophy. MRA head: 1. No intracranial large vessel occlusion. 2. 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 3. No other high-grade proximal intracranial arterial stenosis is identified. 4. Atherosclerotic irregularity of the MCA branches and anterior cerebral arteries bilaterally. Electronically Signed   By: Kellie Simmering DO   On: 03/23/2021 18:28   MR BRAIN WO CONTRAST  Result Date: 03/23/2021 CLINICAL DATA:  Stroke, follow-up. Additional provided: New onset left leg weakness. EXAM: MRI HEAD WITHOUT CONTRAST MRA HEAD WITHOUT CONTRAST TECHNIQUE: Multiplanar, multi-echo pulse sequences of the brain and surrounding structures were acquired without intravenous contrast. Angiographic images of the Circle of Willis were acquired using MRA technique without intravenous contrast. COMPARISON: No pertinent prior exam. COMPARISON:  Head CT performed earlier today 03/23/2021. FINDINGS: MRI HEAD FINDINGS Brain: Mild-to-moderate generalized cerebral atrophy. Comparatively mild cerebellar atrophy. Moderate multifocal T2/FLAIR hyperintensity within the cerebral white matter, nonspecific but compatible with chronic small vessel ischemic disease. There is no acute infarct. No evidence of intracranial mass. No chronic intracranial blood products. No extra-axial fluid collection. No midline shift. Vascular: Expected proximal arterial flow voids. Skull and upper cervical spine: No focal marrow lesion. Sinuses/Orbits: Visualized orbits show no acute finding. Mild bilateral ethmoid sinus mucosal thickening. MRA HEAD FINDINGS Anterior circulation: The intracranial internal carotid arteries are patent. The M1 middle cerebral arteries are patent. Atherosclerotic irregularity of the M2 and more distal middle cerebral artery branch vessels. However, there is no proximal M2 branch occlusion or high-grade proximal M2 stenosis. The anterior cerebral  arteries are patent. Atherosclerotic irregularity of both anterior cerebral arteries without high-grade proximal stenosis. No intracranial aneurysm is identified. Posterior circulation: The intracranial vertebral arteries are patent. 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 The basilar artery is patent. The posterior cerebral arteries are patent without high-grade proximal stenosis. Anatomic variants: Posterior communicating arteries are hypoplastic or absent bilaterally. IMPRESSION: MRI brain: 1. No evidence of acute intracranial abnormality. 2. Moderate cerebral white matter chronic small vessel ischemic disease. 3. Mild-to-moderate generalized cerebral atrophy with comparatively mild cerebellar atrophy. MRA head: 1. No intracranial large vessel occlusion. 2. 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 3. No other high-grade proximal intracranial arterial stenosis is identified. 4. Atherosclerotic irregularity of the MCA branches and anterior cerebral arteries bilaterally. Electronically Signed   By: Kellie Simmering DO   On: 03/23/2021 18:28   VAS [place] ABI WITH/WO TBI  Result Date: 03/24/2021  LOWER EXTREMITY DOPPLER STUDY Patient  Name:  KHOEN GENET  Date of Exam:   03/24/2021 Medical Rec #: [id number]       Accession #:    [id number] Date of Birth: [birth year]       Patient Gender: M Patient Age:   89Y Exam Location:  [facility] Procedure:      VAS [place] ABI WITH/WO TBI Referring Phys: [id number] [name] --------------------------------------------------------------------------------  Indications: Claudication. High Risk Factors: Hypertension, hyperlipidemia.  Comparison Study: No prior studies. Performing Technologist: Carlos Levering RVT  Examination Guidelines: A complete evaluation includes at minimum, Doppler waveform signals and systolic blood pressure reading at the level of bilateral  brachial, anterior tibial, and posterior tibial arteries, when vessel segments are accessible. Bilateral testing is considered an integral part of a complete examination. Photoelectric Plethysmograph (PPG) waveforms and toe systolic pressure readings are included as required and additional duplex testing as needed. Limited examinations for reoccurring indications may be performed as noted.  ABI Findings: +---------+------------------+-----+---------+--------+ Right    Rt Pressure (mmHg)IndexWaveform Comment  +---------+------------------+-----+---------+--------+ Brachial 147                    triphasic         +---------+------------------+-----+---------+--------+ PTA      126               0.86 biphasic          +---------+------------------+-----+---------+--------+ DP       143               0.97 biphasic          +---------+------------------+-----+---------+--------+ Great Toe59                0.40                   +---------+------------------+-----+---------+--------+ +---------+------------------+-----+----------+-------+ Left     Lt Pressure (mmHg)IndexWaveform  Comment +---------+------------------+-----+----------+-------+ Brachial 130                    triphasic         +---------+------------------+-----+----------+-------+ PTA      144               0.98 biphasic          +---------+------------------+-----+----------+-------+ DP       142               0.97 monophasic        +---------+------------------+-----+----------+-------+ Great Toe73                0.50                   +---------+------------------+-----+----------+-------+ +-------+-----------+-----------+------------+------------+ ABI/TBIToday's ABIToday's TBIPrevious ABIPrevious TBI +-------+-----------+-----------+------------+------------+ Right  0.97       0.4                                 +-------+-----------+-----------+------------+------------+ Left    0.98       0.5                                 +-------+-----------+-----------+------------+------------+  Summary: Right: Resting right ankle-brachial index is within normal range. No evidence of significant right lower extremity arterial disease. The right toe-brachial index is abnormal. Left: Resting left ankle-brachial index is within normal range. No evidence of significant left lower extremity arterial disease. The left toe-brachial  index is abnormal.  *See table(s) above for measurements and observations.     Preliminary    ECHOCARDIOGRAM COMPLETE  Result Date: 03/25/2021    ECHOCARDIOGRAM REPORT   Patient Name:   EZERIAH LUTY Date of Exam: 03/25/2021 Medical Rec #:  [id number]      Height:       64.0 in Accession #:    [id number]     Weight:       166.2 lb Date of Birth:  10/19/1945      BSA:          1.808 m Patient Age:    30 years       BP:           125/70 mmHg Patient Gender: M              HR:           49 bpm. Exam Location:  Inpatient Procedure: 2D Echo, Cardiac Doppler and Color Doppler Indications:    TIA G45.9  History:        Patient has prior history of Echocardiogram examinations, most                 recent 04/17/2020. CHF, CAD, COPD, Arrythmias:Atrial                 Fibrillation, Signs/Symptoms:Murmur; Risk Factors:Dyslipidemia.                 Rheumatic Fever. GERD.  Sonographer:    Jonelle Sidle Dance Referring Phys: [id number] Bonnie  1. Left ventricular ejection fraction, by estimation, is 50 to 55%. The left ventricle has low normal function. The left ventricle has no regional wall motion abnormalities. Left ventricular diastolic parameters are indeterminate.  2. Right ventricular systolic function is normal. The right ventricular size is normal.  3. The mitral valve is normal in structure. Trivial mitral valve regurgitation. No evidence of mitral stenosis.  4. The aortic valve is tricuspid. Aortic valve regurgitation is not visualized. Mild aortic valve sclerosis is  present, with no evidence of aortic valve stenosis.  5. The inferior vena cava is normal in size with greater than 50% respiratory variability, suggesting right atrial pressure of 3 mmHg. FINDINGS  Left Ventricle: Left ventricular ejection fraction, by estimation, is 50 to 55%. The left ventricle has low normal function. The left ventricle has no regional wall motion abnormalities. The left ventricular internal cavity size was normal in size. There is no left ventricular hypertrophy. Left ventricular diastolic parameters are indeterminate. Right Ventricle: The right ventricular size is normal. Right ventricular systolic function is normal. Left Atrium: Left atrial size was normal in size. Right Atrium: Right atrial size was normal in size. Pericardium: There is no evidence of pericardial effusion. Mitral Valve: The mitral valve is normal in structure. Mild mitral annular calcification. Trivial mitral valve regurgitation. No evidence of mitral valve stenosis. Tricuspid Valve: The tricuspid valve is normal in structure. Tricuspid valve regurgitation is not demonstrated. No evidence of tricuspid stenosis. Aortic Valve: The aortic valve is tricuspid. Aortic valve regurgitation is not visualized. Mild aortic valve sclerosis is present, with no evidence of aortic valve stenosis. Pulmonic Valve: The pulmonic valve was normal in structure. Pulmonic valve regurgitation is not visualized. No evidence of pulmonic stenosis. Aorta: The aortic root is normal in size and structure. Venous: The inferior vena cava is normal in size with greater than 50% respiratory variability, suggesting right atrial pressure of 3 mmHg. IAS/Shunts: No atrial  level shunt detected by color flow Doppler.  LEFT VENTRICLE PLAX 2D LVIDd:         4.50 cm  Diastology LVIDs:         3.70 cm  LV e' medial:    6.24 cm/s LV PW:         1.40 cm  LV E/e' medial:  11.0 LV IVS:        1.20 cm  LV e' lateral:   10.60 cm/s LVOT diam:     2.00 cm  LV E/e' lateral:  6.5 LV SV:         55 LV SV Index:   30 LVOT Area:     3.14 cm  RIGHT VENTRICLE            IVC RV Basal diam:  2.50 cm    IVC diam: 1.30 cm RV S prime:     8.92 cm/s TAPSE (M-mode): 1.6 cm LEFT ATRIUM             Index       RIGHT ATRIUM           Index LA diam:        4.90 cm 2.71 cm/m  RA Area:     15.50 cm LA Vol (A2C):   59.6 ml 32.96 ml/m RA Volume:   37.80 ml  20.90 ml/m LA Vol (A4C):   54.1 ml 29.91 ml/m LA Biplane Vol: 57.8 ml 31.96 ml/m  AORTIC VALVE LVOT Vmax:   80.00 cm/s LVOT Vmean:  53.300 cm/s LVOT VTI:    0.175 m  AORTA Ao Root diam: 3.20 cm Ao Asc diam:  3.30 cm MITRAL VALVE MV Area (PHT): 2.62 cm    SHUNTS MV Decel Time: 289 msec    Systemic VTI:  0.18 m MV E velocity: 68.70 cm/s  Systemic Diam: 2.00 cm MV A velocity: 58.70 cm/s MV E/A ratio:  1.17 Kirk Ruths MD Electronically signed by Kirk Ruths MD Signature Date/Time: 03/25/2021/3:23:56 PM    Final    CT HEAD CODE STROKE WO CONTRAST  Result Date: 03/23/2021 CLINICAL DATA:  Code stroke. EXAM: CT HEAD WITHOUT CONTRAST TECHNIQUE: Contiguous axial images were obtained from the base of the skull through the vertex without intravenous contrast. COMPARISON:  None. FINDINGS: Brain: No acute infarct, hemorrhage, or mass lesion is present. Mild atrophy and white matter changes are present. The ventricles are of normal size. No significant extraaxial fluid collection is present. The brainstem and cerebellum are within normal limits. Vascular: Atherosclerotic calcifications are present within the cavernous internal carotid arteries bilaterally. Calcifications are present at the dural margin of the vertebral arteries. No hyperdense vessel is present. Skull: Calvarium is intact. No focal lytic or blastic lesions are present. No significant extracranial soft tissue lesion is present. Sinuses/Orbits: The paranasal sinuses and mastoid air cells are clear. Bilateral lens replacements are noted. Globes and orbits are otherwise unremarkable.  ASPECTS [facility] Stroke Program Early CT Score) - Ganglionic level infarction (caudate, lentiform nuclei, internal capsule, insula, M1-M3 cortex): 7/7 - Supraganglionic infarction (M4-M6 cortex): 3/3 Total score (0-10 with 10 being normal): 10/10 IMPRESSION: 1. No acute intracranial abnormality. 2. Mild atrophy and white matter changes, compatible with age 81. ASPECTS is 10/10 The above was relayed via text pager to Dr. Cheral Marker on 03/23/2021 at 14:03 . Electronically Signed   By: San Morelle M.D.   On: 03/23/2021 14:08   VAS US CAROTID  Result Date: 03/24/2021 Carotid Arterial Duplex Study Patient Name:  [name]  Date of Exam:   03/24/2021 Medical Rec #: [id number]       Accession #:    [id number] Date of Birth: 10-12-1945       Patient Gender: M Patient Age:   89T Exam Location:  [facility] Procedure:      VAS US CAROTID Referring Phys: [id number] [name] --------------------------------------------------------------------------------  Indications:       CVA. Risk Factors:      Hypertension, hyperlipidemia, prior CVA. Comparison Study:  No prior studies. Performing Technologist: Oliver Hum RVT  Examination Guidelines: A complete evaluation includes B-mode imaging, spectral Doppler, color Doppler, and power Doppler as needed of all accessible portions of each vessel. Bilateral testing is considered an integral part of a complete examination. Limited examinations for reoccurring indications may be performed as noted.  Right Carotid Findings: +----------+--------+--------+--------+-----------------------+--------+           PSV cm/sEDV cm/sStenosisPlaque Description     Comments +----------+--------+--------+--------+-----------------------+--------+ CCA Prox  89      10              smooth and heterogenous         +----------+--------+--------+--------+-----------------------+--------+ CCA Distal86      8               smooth and heterogenous          +----------+--------+--------+--------+-----------------------+--------+ ICA Prox  52      13              smooth and heterogenous         +----------+--------+--------+--------+-----------------------+--------+ ICA Distal50      12                                     tortuous +----------+--------+--------+--------+-----------------------+--------+ ECA       133     7                                               +----------+--------+--------+--------+-----------------------+--------+ +----------+--------+-------+--------+-------------------+           PSV cm/sEDV cmsDescribeArm Pressure (mmHG) +----------+--------+-------+--------+-------------------+ [id number]                                        +----------+--------+-------+--------+-------------------+ +---------+--------+--+--------+-+---------+ VertebralPSV cm/s45EDV cm/s8Antegrade +---------+--------+--+--------+-+---------+  Left Carotid Findings: +----------+--------+--------+--------+-----------------------+--------+           PSV cm/sEDV cm/sStenosisPlaque Description     Comments +----------+--------+--------+--------+-----------------------+--------+ CCA Prox  135     13              smooth and heterogenous         +----------+--------+--------+--------+-----------------------+--------+ CCA Distal90      18              smooth and heterogenous         +----------+--------+--------+--------+-----------------------+--------+ ICA Prox  121     25              smooth and heterogenoustortuous +----------+--------+--------+--------+-----------------------+--------+ ICA Distal131     25  tortuous +----------+--------+--------+--------+-----------------------+--------+ ECA       232     12                                              +----------+--------+--------+--------+-----------------------+--------+  +----------+--------+--------+--------+-------------------+           PSV cm/sEDV cm/sDescribeArm Pressure (mmHG) +----------+--------+--------+--------+-------------------+ Subclavian204                                         +----------+--------+--------+--------+-------------------+ +---------+--------+--+--------+--+---------+ VertebralPSV cm/s73EDV cm/s10Antegrade +---------+--------+--+--------+--+---------+   Summary: Right Carotid: Velocities in the right ICA are consistent with a 1-39% stenosis. Left Carotid: Velocities in the left ICA are consistent with a 1-39% stenosis. Vertebrals: Bilateral vertebral arteries demonstrate antegrade flow. *See table(s) above for measurements and observations.  Electronically signed by Ruta Hinds MD on 03/24/2021 at 6:42:20 PM.    Final       Subjective: Patient was seen and examined at bedside.  Overnight events noted.  Patient reports feeling much improved has been ambulated without any symptoms and he wants to be discharged home.  Discharge Exam: Vitals:   03/25/21 0736 03/25/21 1139  BP: 138/81 125/70  Pulse: (!) 54 (!) 49  Resp: 16 18  Temp: 97.9 F (36.6 C) 97.8 F (36.6 C)  SpO2: 97% 98%   Vitals:   03/25/21 0007 03/25/21 0400 03/25/21 0736 03/25/21 1139  BP: 126/63 (!) 131/58 138/81 125/70  Pulse: 61 (!) 46 (!) 54 (!) 49  Resp: 17 16 16 18   Temp: 98.6 F (37 C) 98.4 F (36.9 C) 97.9 F (36.6 C) 97.8 F (36.6 C)  TempSrc: Temporal Temporal Oral Oral  SpO2: 99% 100% 97% 98%  Weight:        General: Pt is alert, awake, not in acute distress Cardiovascular: RRR, S1/S2 +, no rubs, no gallops Respiratory: CTA bilaterally, no wheezing, no rhonchi Abdominal: Soft, NT, ND, bowel sounds + Extremities: no edema, no cyanosis    The results of significant diagnostics from this hospitalization (including imaging, microbiology, ancillary and laboratory) are listed below for reference.     Microbiology: Recent  Results (from the past 240 hour(s))  Resp Panel by RT-PCR (Flu A&B, Covid) Nasopharyngeal Swab     Status: None   Collection Time: 03/23/21  2:31 PM   Specimen: Nasopharyngeal Swab; Nasopharyngeal(NP) swabs in vial transport medium  Result Value Ref Range Status   SARS Coronavirus 2 by RT PCR NEGATIVE NEGATIVE Final    Comment: (NOTE) SARS-CoV-2 target nucleic acids are NOT DETECTED.  The SARS-CoV-2 RNA is generally detectable in upper respiratory specimens during the acute phase of infection. The lowest concentration of SARS-CoV-2 viral copies this assay can detect is 138 copies/mL. A negative result does not preclude SARS-Cov-2 infection and should not be used as the sole basis for treatment or other patient management decisions. A negative result may occur with  improper specimen collection/handling, submission of specimen other than nasopharyngeal swab, presence of viral mutation(s) within the areas targeted by this assay, and inadequate number of viral copies(<138 copies/mL). A negative result must be combined with clinical observations, patient history, and epidemiological information. The expected result is Negative.  Fact Sheet for Patients:  EntrepreneurPulse.com.au  Fact Sheet for Healthcare Providers:  IncredibleEmployment.be  This test is no t yet  approved or cleared by the [place] and  has been authorized for detection and/or diagnosis of SARS-CoV-2 by FDA under an Emergency Use Authorization (EUA). This EUA will remain  in effect (meaning this test can be used) for the duration of the COVID-19 declaration under Section 564(b)(1) of the Act, 21 U.S.C.section 360bbb-3(b)(1), unless the authorization is terminated  or revoked sooner.       Influenza A by PCR NEGATIVE NEGATIVE Final   Influenza B by PCR NEGATIVE NEGATIVE Final    Comment: (NOTE) The Xpert Xpress SARS-CoV-2/FLU/RSV plus assay is intended as an aid in the  diagnosis of influenza from Nasopharyngeal swab specimens and should not be used as a sole basis for treatment. Nasal washings and aspirates are unacceptable for Xpert Xpress SARS-CoV-2/FLU/RSV testing.  Fact Sheet for Patients: EntrepreneurPulse.com.au  Fact Sheet for Healthcare Providers: IncredibleEmployment.be  This test is not yet approved or cleared by the [place] FDA and has been authorized for detection and/or diagnosis of SARS-CoV-2 by FDA under an Emergency Use Authorization (EUA). This EUA will remain in effect (meaning this test can be used) for the duration of the COVID-19 declaration under Section 564(b)(1) of the Act, 21 U.S.C. section 360bbb-3(b)(1), unless the authorization is terminated or revoked.  Performed at [facility], [place] [address] [place], [place] [address]      Labs: BNP (last 3 results) Recent Labs    05/02/20 2007  BNP 268.3*   Basic Metabolic Panel: Recent Labs  Lab 03/23/21 1351 03/23/21 1401 03/25/21 0300  NA 136 138 135  K 4.8 4.7 4.3  CL 102 105 100  CO2 27  --  26  GLUCOSE 100* 96 91  BUN 27* 28* 25*  CREATININE 1.41* 1.40* 1.44*  CALCIUM 9.4  --  9.5  MG  --   --  1.7  PHOS  --   --  3.9   Liver Function Tests: Recent Labs  Lab 03/23/21 1351  AST 19  ALT 13  ALKPHOS 60  BILITOT 0.9  PROT 7.0  ALBUMIN 3.6   No results for input(s): LIPASE, AMYLASE in the last 168 hours. No results for input(s): AMMONIA in the last 168 hours. CBC: Recent Labs  Lab 03/23/21 1351 03/23/21 1401 03/25/21 0300  WBC 11.5*  --  12.3*  NEUTROABS 7.6  --   --   HGB 14.3 13.9 13.0  HCT 43.0 41.0 38.8*  MCV 92.7  --  90.7  PLT 195  --  181   Cardiac Enzymes: No results for input(s): CKTOTAL, CKMB, CKMBINDEX, TROPONINI in the last 168 hours. BNP: Invalid input(s): POCBNP CBG: Recent Labs  Lab 03/23/21 1401  GLUCAP 93   D-Dimer No results for input(s): DDIMER in the last 72  hours. Hgb A1c Recent Labs    03/24/21 1215  HGBA1C 5.5   Lipid Profile Recent Labs    03/24/21 1215  CHOL 289*  HDL 51  LDLCALC 222*  TRIG 80  CHOLHDL 5.7   Thyroid function studies No results for input(s): TSH, T4TOTAL, T3FREE, THYROIDAB in the last 72 hours.  Invalid input(s): FREET3 Anemia work up No results for input(s): VITAMINB12, FOLATE, FERRITIN, TIBC, IRON, RETICCTPCT in the last 72 hours. Urinalysis    Component Value Date/Time   COLORURINE YELLOW 03/23/2021 1351   APPEARANCEUR CLEAR 03/23/2021 1351   LABSPEC 1.015 03/23/2021 1351   PHURINE 6.0 03/23/2021 1351   GLUCOSEU NEGATIVE 03/23/2021 1351   GLUCOSEU NEGATIVE 03/04/2021 1435   HGBUR NEGATIVE  03/23/2021 1351   HGBUR negative 03/15/2009 0804   BILIRUBINUR NEGATIVE 03/23/2021 1351   BILIRUBINUR n 12/01/2013 1136   KETONESUR NEGATIVE 03/23/2021 1351   PROTEINUR NEGATIVE 03/23/2021 1351   UROBILINOGEN 0.2 03/04/2021 1435   NITRITE NEGATIVE 03/23/2021 1351   LEUKOCYTESUR NEGATIVE 03/23/2021 1351   Sepsis Labs Invalid input(s): PROCALCITONIN,  WBC,  LACTICIDVEN Microbiology Recent Results (from the past 240 hour(s))  Resp Panel by RT-PCR (Flu A&B, Covid) Nasopharyngeal Swab     Status: None   Collection Time: 03/23/21  2:31 PM   Specimen: Nasopharyngeal Swab; Nasopharyngeal(NP) swabs in vial transport medium  Result Value Ref Range Status   SARS Coronavirus 2 by RT PCR NEGATIVE NEGATIVE Final    Comment: (NOTE) SARS-CoV-2 target nucleic acids are NOT DETECTED.  The SARS-CoV-2 RNA is generally detectable in upper respiratory specimens during the acute phase of infection. The lowest concentration of SARS-CoV-2 viral copies this assay can detect is 138 copies/mL. A negative result does not preclude SARS-Cov-2 infection and should not be used as the sole basis for treatment or other patient management decisions. A negative result may occur with  improper specimen collection/handling, submission of  specimen other than nasopharyngeal swab, presence of viral mutation(s) within the areas targeted by this assay, and inadequate number of viral copies(<138 copies/mL). A negative result must be combined with clinical observations, patient history, and epidemiological information. The expected result is Negative.  Fact Sheet for Patients:  EntrepreneurPulse.com.au  Fact Sheet for Healthcare Providers:  IncredibleEmployment.be  This test is no t yet approved or cleared by the [place] FDA and  has been authorized for detection and/or diagnosis of SARS-CoV-2 by FDA under an Emergency Use Authorization (EUA). This EUA will remain  in effect (meaning this test can be used) for the duration of the COVID-19 declaration under Section 564(b)(1) of the Act, 21 U.S.C.section 360bbb-3(b)(1), unless the authorization is terminated  or revoked sooner.       Influenza A by PCR NEGATIVE NEGATIVE Final   Influenza B by PCR NEGATIVE NEGATIVE Final    Comment: (NOTE) The Xpert Xpress SARS-CoV-2/FLU/RSV plus assay is intended as an aid in the diagnosis of influenza from Nasopharyngeal swab specimens and should not be used as a sole basis for treatment. Nasal washings and aspirates are unacceptable for Xpert Xpress SARS-CoV-2/FLU/RSV testing.  Fact Sheet for Patients: EntrepreneurPulse.com.au  Fact Sheet for Healthcare Providers: IncredibleEmployment.be  This test is not yet approved or cleared by the [place] FDA and has been authorized for detection and/or diagnosis of SARS-CoV-2 by FDA under an Emergency Use Authorization (EUA). This EUA will remain in effect (meaning this test can be used) for the duration of the COVID-19 declaration under Section 564(b)(1) of the Act, 21 U.S.C. section 360bbb-3(b)(1), unless the authorization is terminated or revoked.  Performed at [facility], [place] [address]  [place], [place] [address]      Time coordinating discharge: Over 30 minutes  SIGNED:   Shawna Clamp, MD  [facility] 03/25/2021, 4:13 PM Pager   If 7PM-7AM, please contact night-coverage [email or web address]

## 2021-03-25 NOTE — Progress Notes (Signed)
Discharge instructions, Rx's and follow up appts explained and provided to patient and family verbalized understanding. Patient left floor via wheelchair accompanied by staff no c/o pain or shortness of breath at d/c.  Jowanda Heeg, Tivis Ringer, RN

## 2021-03-25 NOTE — TOC Transition Note (Signed)
Transition of Care [facility]) - CM/SW Discharge Note   Patient Details  Name: Ryan [name] MRN: [id number] Date of Birth: 1946/01/01  Transition of [facility]) CM/SW Contact:  Pollie Friar, RN Phone Number: 03/25/2021, 12:02 PM   Clinical Narrative:    Patient discharging home with outpatient therapy through [facility] PT. CM verified their receipt of the orders.  CM called pts pharmacy to verify the cost of Pradaxa. He is in the donut hole so his cost this month is $131. Pt is ok with this cost.  Pt has transport home.    Final next level of care: OP Rehab Barriers to Discharge: No Barriers Identified   Patient Goals and CMS Choice     Choice offered to / list presented to : Patient  Discharge Placement                       Discharge Plan and Services                                     Social Determinants of Health (SDOH) Interventions     Readmission Risk Interventions No flowsheet data found.

## 2021-03-25 NOTE — TOC Benefit Eligibility Note (Signed)
Transition of Care [facility]) Benefit Eligibility Note    Patient Details  Name: Ryan [name] MRN: [id number] Date of Birth: 1946/08/08   Medication/Dose: PRADAXA  150 MG BID  Covered?: Yes  Tier:  [place])  Prescription Coverage Preferred Pharmacy: [facility]  [facility] [place] #  [phone]  Spoke with Person/Company/Phone Number:: [facility]  @  [employer] # [phone]  Co-Pay: $4.36  Prior Approval: No  Deductible: Met       Memory Argue Phone Number: 03/25/2021, 9:44 AM

## 2021-03-25 NOTE — Plan of Care (Signed)
  Problem: Education: Goal: Knowledge of disease or condition will improve Outcome: Adequate for Discharge Goal: Knowledge of secondary prevention will improve Outcome: Adequate for Discharge Goal: Knowledge of patient specific risk factors addressed and post discharge goals established will improve Outcome: Adequate for Discharge   Problem: Coping: Goal: Will verbalize positive feelings about self Outcome: Adequate for Discharge   Problem: Self-Care: Goal: Ability to participate in self-care as condition permits will improve Outcome: Adequate for Discharge Goal: Verbalization of feelings and concerns over difficulty with self-care will improve Outcome: Adequate for Discharge   Problem: Intracerebral Hemorrhage Tissue Perfusion: Goal: Complications of Intracerebral Hemorrhage will be minimized Outcome: Adequate for Discharge   Problem: Ischemic Stroke/TIA Tissue Perfusion: Goal: Complications of ischemic stroke/TIA will be minimized Outcome: Adequate for Discharge

## 2021-03-26 ENCOUNTER — Telehealth: Payer: Self-pay

## 2021-03-26 ENCOUNTER — Encounter: Payer: Self-pay | Admitting: Family Medicine

## 2021-03-26 NOTE — Telephone Encounter (Signed)
Transition Care Management Follow-up Telephone Call Date of discharge and from where: 03/25/21-[place] How have you been since you were released from the hospital? Doing good-feeling much better Any questions or concerns? No  Items Reviewed: Did the pt receive and understand the discharge instructions provided? Yes  Medications obtained and verified? Yes  Other? Yes  Any new allergies since your discharge? No  Dietary orders reviewed? Yes Do you have support at home? Yes   Home Care and Equipment/Supplies: Were home health services ordered? no If so, what is the name of the agency? N/a  Has the agency set up a time to come to the patient's home? not applicable Were any new equipment or medical supplies ordered?  No What is the name of the medical supply agency? N/a Were you able to get the supplies/equipment? not applicable   Functional Questionnaire: (I = Independent and D = Dependent) ADLs: I  Bathing/Dressing- I  Meal Prep- I  Eating- I  Maintaining continence- I  Transferring/Ambulation- I  Managing Meds- I  Follow up appointments reviewed:  PCP Hospital f/u appt confirmed? No  No appt's available-pt going out of town on 04/04/21-message sent to PCP & office [facility] f/u appt confirmed? No  Patient awaiting call from Neurology Are transportation arrangements needed? No  If their condition worsens, is the pt aware to call PCP or go to the Emergency Dept.? Yes Was the patient provided with contact information for the PCP's office or ED? Yes Was to pt encouraged to call back with questions or concerns? Yes

## 2021-03-27 ENCOUNTER — Other Ambulatory Visit: Payer: Self-pay

## 2021-03-27 ENCOUNTER — Emergency Department (HOSPITAL_COMMUNITY)
Admission: EM | Admit: 2021-03-27 | Discharge: 2021-03-28 | Disposition: A | Discharge status: Home / Self Care | Payer: Medicare Other | Attending: Emergency Medicine | Admitting: Emergency Medicine

## 2021-03-27 ENCOUNTER — Emergency Department (HOSPITAL_COMMUNITY): Payer: Medicare Other

## 2021-03-27 ENCOUNTER — Telehealth: Payer: Self-pay

## 2021-03-27 DIAGNOSIS — N189 Chronic kidney disease, unspecified: Secondary | ICD-10-CM | POA: Diagnosis not present

## 2021-03-27 DIAGNOSIS — R079 Chest pain, unspecified: Secondary | ICD-10-CM

## 2021-03-27 DIAGNOSIS — J449 Chronic obstructive pulmonary disease, unspecified: Secondary | ICD-10-CM | POA: Insufficient documentation

## 2021-03-27 DIAGNOSIS — I130 Hypertensive heart and chronic kidney disease with heart failure and stage 1 through stage 4 chronic kidney disease, or unspecified chronic kidney disease: Secondary | ICD-10-CM | POA: Diagnosis not present

## 2021-03-27 DIAGNOSIS — I2510 Atherosclerotic heart disease of native coronary artery without angina pectoris: Secondary | ICD-10-CM | POA: Diagnosis not present

## 2021-03-27 DIAGNOSIS — Z87891 Personal history of nicotine dependence: Secondary | ICD-10-CM | POA: Insufficient documentation

## 2021-03-27 DIAGNOSIS — Z20822 Contact with and (suspected) exposure to covid-19: Secondary | ICD-10-CM | POA: Diagnosis not present

## 2021-03-27 DIAGNOSIS — Z79899 Other long term (current) drug therapy: Secondary | ICD-10-CM | POA: Insufficient documentation

## 2021-03-27 DIAGNOSIS — I509 Heart failure, unspecified: Secondary | ICD-10-CM | POA: Diagnosis not present

## 2021-03-27 DIAGNOSIS — I480 Paroxysmal atrial fibrillation: Secondary | ICD-10-CM | POA: Diagnosis not present

## 2021-03-27 DIAGNOSIS — I4430 Unspecified atrioventricular block: Secondary | ICD-10-CM | POA: Diagnosis not present

## 2021-03-27 DIAGNOSIS — R0602 Shortness of breath: Secondary | ICD-10-CM | POA: Diagnosis not present

## 2021-03-27 DIAGNOSIS — R42 Dizziness and giddiness: Secondary | ICD-10-CM | POA: Diagnosis not present

## 2021-03-27 DIAGNOSIS — J439 Emphysema, unspecified: Secondary | ICD-10-CM | POA: Diagnosis not present

## 2021-03-27 DIAGNOSIS — R0789 Other chest pain: Secondary | ICD-10-CM | POA: Diagnosis not present

## 2021-03-27 DIAGNOSIS — Z7901 Long term (current) use of anticoagulants: Secondary | ICD-10-CM | POA: Diagnosis not present

## 2021-03-27 LAB — COMPREHENSIVE METABOLIC PANEL
ALT: 12 U/L (ref 0–44)
AST: 16 U/L (ref 15–41)
Albumin: 3.3 g/dL — ABNORMAL LOW (ref 3.5–5.0)
Alkaline Phosphatase: 52 U/L (ref 38–126)
Anion gap: 11 (ref 5–15)
BUN: 22 mg/dL (ref 8–23)
CO2: 18 mmol/L — ABNORMAL LOW (ref 22–32)
Calcium: 8.7 mg/dL — ABNORMAL LOW (ref 8.9–10.3)
Chloride: 103 mmol/L (ref 98–111)
Creatinine, Ser: 1.3 mg/dL — ABNORMAL HIGH (ref 0.61–1.24)
GFR, Estimated: 58 mL/min — ABNORMAL LOW (ref >60)
Glucose, Bld: 88 mg/dL (ref 70–99)
Potassium: 4.1 mmol/L (ref 3.5–5.1)
Sodium: 132 mmol/L — ABNORMAL LOW (ref 135–145)
Total Bilirubin: 1 mg/dL (ref 0.3–1.2)
Total Protein: 6.2 g/dL — ABNORMAL LOW (ref 6.5–8.1)

## 2021-03-27 LAB — CBC WITH DIFFERENTIAL/PLATELET
Abs Immature Granulocytes: 0.07 10*3/uL (ref 0.00–0.07)
Basophils Absolute: 0 10*3/uL (ref 0.0–0.1)
Basophils Relative: 0 %
Eosinophils Absolute: 0.1 10*3/uL (ref 0.0–0.5)
Eosinophils Relative: 1 %
HCT: 37.7 % — ABNORMAL LOW (ref 39.0–52.0)
Hemoglobin: 12.8 g/dL — ABNORMAL LOW (ref 13.0–17.0)
Immature Granulocytes: 1 %
Lymphocytes Relative: 23 %
Lymphs Abs: 2.2 10*3/uL (ref 0.7–4.0)
MCH: 31 pg (ref 26.0–34.0)
MCHC: 34 g/dL (ref 30.0–36.0)
MCV: 91.3 fL (ref 80.0–100.0)
Monocytes Absolute: 0.8 10*3/uL (ref 0.1–1.0)
Monocytes Relative: 8 %
Neutro Abs: 6.6 10*3/uL (ref 1.7–7.7)
Neutrophils Relative %: 67 %
Platelets: 188 10*3/uL (ref 150–400)
RBC: 4.13 MIL/uL — ABNORMAL LOW (ref 4.22–5.81)
RDW: 13.2 % (ref 11.5–15.5)
WBC: 9.8 10*3/uL (ref 4.0–10.5)
nRBC: 0 % (ref 0.0–0.2)

## 2021-03-27 LAB — RESP PANEL BY RT-PCR (FLU A&B, COVID) ARPGX2
Influenza A by PCR: NEGATIVE
Influenza B by PCR: NEGATIVE
SARS Coronavirus 2 by RT PCR: NEGATIVE

## 2021-03-27 LAB — TROPONIN I (HIGH SENSITIVITY): Troponin I (High Sensitivity): 13 ng/L (ref <18)

## 2021-03-27 LAB — MAGNESIUM: Magnesium: 1.4 mg/dL — ABNORMAL LOW (ref 1.7–2.4)

## 2021-03-27 LAB — BRAIN NATRIURETIC PEPTIDE: B Natriuretic Peptide: 96.2 pg/mL (ref 0.0–100.0)

## 2021-03-27 MED ORDER — ACETAMINOPHEN 500 MG PO TABS
500.0000 mg | ORAL_TABLET | Freq: Once | ORAL | Status: AC
  Administered 2021-03-27: 500 mg via ORAL
  Filled 2021-03-27: qty 1

## 2021-03-27 MED ORDER — MAGNESIUM OXIDE -MG SUPPLEMENT 400 (240 MG) MG PO TABS
400.0000 mg | ORAL_TABLET | Freq: Once | ORAL | Status: AC
  Administered 2021-03-27: 400 mg via ORAL
  Filled 2021-03-27: qty 1

## 2021-03-27 MED ORDER — LACTATED RINGERS IV BOLUS
500.0000 mL | Freq: Once | INTRAVENOUS | Status: AC
  Administered 2021-03-27: 500 mL via INTRAVENOUS

## 2021-03-27 NOTE — ED Triage Notes (Addendum)
Pt here bib [facility] from home due to dizziness, SOB, and chest tightness that started around 1400 today. Pt was recently seen for a TIA on 03/23/21. Eliquis was changed to Pradaxa 150mg  twice daily upon discharge. Pt took 2 doses yesterday and 1 dose today. After 1st dose today, pts symptoms started.   Positive orthostatics noted by EMS. BP went down roughly 30 points and HR went up roughly 30 points.   324 ASA given en route for chest tightness 1st degree heart block noted on EMS ECG  Vitals: BP: 160/82 HR: 53 O2: 98% RA placed on 2L for comfort CBG: 112

## 2021-03-27 NOTE — ED Provider Notes (Signed)
[facility] EMERGENCY DEPARTMENT Provider Note   CSN: [id number] Arrival date & time: 03/27/21  2105     History Chief Complaint  Patient presents with   Dizziness    Ryan [name] is a 75 y.o. male.   Dizziness Associated symptoms: no chest pain, no palpitations, no shortness of breath, no vomiting and no weakness    Patient is a 75 year old male with history of CHF with preserved ejection fraction, COPD, paroxysmal atrial fibrillation on Eliquis, hypertension, hyperlipidemia, chronic kidney disease and chronic back pain who presents to the emergency department after an episode of lightheadedness/dizziness that started today around 2 PM. He was recently admitted last week, discharged on 6/21 after being admitted for stroke work-up after presenting with acute onset left lower extremity weakness.  He was not a tPA candidate given systemic anticoagulation, MRI ultimately showed no intracranial abnormality and there was no intracranial large vessel occlusion. Neurology recommended transitioning Eliquis to Pradaxa which the patient did starting yesterday.  Patient feels as though the Pradaxa is what caused his symptoms today and describes it as a reaction to the Pradaxa. Prior to this episode he felt anxious and flushed.  No syncopal episode.  No recent bright red blood per rectum.  Denies dark stools.  No recent head injury or other trauma.  Denies infectious symptoms such as cough, shortness of breath, fever, chills, abdominal pain, nausea or vomiting.  EMS reported normotension however had positive orthostatics on their evaluation.      Past Medical History:  Diagnosis Date   A-fib ([place]) 03/22/2018   Anemia    CAD (coronary artery disease)    reportedly moderate CAD; managed medically   Cataract    removed from both eyes   CHF (congestive heart failure) ([place])    COPD (chronic obstructive pulmonary disease) ([place])    Eczema 08/03/2014   GERD (gastroesophageal  reflux disease)    H/O: rheumatic fever    Heart murmur    High risk medication use    on amiodarone since 03/31/2012   History of colonoscopy    Hyperglycemia 06/21/2017   Hyperlipidemia    Hypertension    Kidney stone 06/21/2017   Lobar pneumonia ([place]) 03/22/2018   Low vitamin D level 06/21/2017   Macular degeneration    Osteoporosis 05/31/2016    Patient Active Problem List   Diagnosis Date Noted   CVA (cerebral vascular accident) ([place]) 03/23/2021   Stroke-like symptoms    Urinary frequency 03/04/2021   Hx of colonic polyps 11/24/2020   Diarrhea 11/24/2020   Dyspnea 04/20/2020   Anxiety 04/20/2020   Gastritis 04/10/2020   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   Elevated troponin 04/09/2020   Nausea, vomiting, and diarrhea 04/09/2020   Hypomagnesemia 04/09/2020   Hypokalemia 04/09/2020   Acute urinary retention 04/09/2020   Statin intolerance 12/19/2019   Vitamin D deficiency 09/18/2019   CHF (congestive heart failure) ([place]) 03/22/2018   PAF (paroxysmal atrial fibrillation) ([place]) 03/22/2018   Edema 03/22/2018   H/O: rheumatic fever 12/27/2017   Hyperglycemia 06/21/2017   Kidney stone 06/21/2017   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   Osteoporosis 05/31/2016   BPH (benign prostatic hyperplasia) 01/20/2015   Erectile dysfunction 01/20/2015   Rectal bleeding 01/20/2015   Preventative health care 01/20/2015   Eczema 08/03/2014   Sleep apnea 03/01/2014   Anticoagulation adequate with anticoagulant therapy 03/01/2014   Pre-syncope 02/26/2014   Abdominal pain 02/26/2014   PAD (peripheral artery disease) ([place]) 02/26/2014  Constipation 03/06/2013   Easy bruising 03/06/2013   History of alcohol abuse 04/15/2012   CAD (coronary atherosclerotic disease) 05/08/2011   Atypical chest pain 03/10/2011   LUMBAR RADICULOPATHY, 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   UNS ADVRS EFF UNS RX MEDICINAL&BIOLOGICAL SBSTNC  11/11/2009   H/O tobacco use, presenting hazards to health 07/29/2009   COPD GOLD I 07/29/2009   Hyperlipidemia, mixed 06/20/2007   Macular degeneration (senile) of retina 06/20/2007   Essential hypertension 06/20/2007   GERD 06/16/2007    Past Surgical History:  Procedure Laterality Date   APPENDECTOMY     CARDIAC CATHETERIZATION  04/22/2011   moderate left main and RCA stenosis not significant by FFR and IVUS on medical therapy   CARDIOVERSION N/A 04/25/2018   Procedure: CARDIOVERSION;  Surgeon: Skeet Latch, MD;  Location: [facility];  Service: Cardiovascular;  Laterality: N/A;   CERVICAL DISCECTOMY     C5,6,7 disc fused  with plate and 5 1" screws   CHOLECYSTECTOMY     colon polyp removal  3/1.19   COLONOSCOPY     FOOT SURGERY     right calcification removed from top of foot   knee cartiledge Right    right knee   MOUTH SURGERY     periodontal surgery, bridges, splint in front,    UPPER GASTROINTESTINAL ENDOSCOPY         Family History  Problem Relation Age of Onset   Heart disease Mother    Diabetes Mother    Cirrhosis Mother    Emphysema Mother        never smoked but 2nd hand through her spouse   Hypertension Mother    Macular degeneration Mother    Heart disease Father    Cancer Father        prostate   Hyperlipidemia Father    Hypertension Father    Varicose Veins Father    Heart attack Father    Peripheral vascular disease Father    Heart disease Sister    Arthritis Sister    Hyperlipidemia Sister    Obesity Sister    Macular degeneration Sister    Heart disease Brother        5 stents   Hyperlipidemia Brother    Macular degeneration Maternal Grandfather    Cirrhosis Sister    Obesity Sister    Arthritis Sister    Heart disease Sister    Obesity Sister    Liver disease Other    Prostate cancer Other    Coronary artery disease Other    Colon cancer Neg Hx    Esophageal cancer Neg Hx    Rectal cancer Neg Hx    Stomach cancer Neg Hx      Social History   Tobacco Use   Smoking status: Former    Packs/day: 0.50    Years: 50.00    Pack years: 25.00    Types: Cigarettes    Quit date: 05/01/2009    Years since quitting: 11.9   Smokeless tobacco: Former    Types: Nurse, children's Use: Never used  Substance Use Topics   Alcohol use: Yes    Alcohol/week: 1.0 standard drink    Types: 1 Cans of beer per week    Comment: 1 beer daily   Drug use: No    Home Medications Prior to Admission medications   Medication Sig Start Date End Date Taking? Authorizing Provider  amoxicillin (AMOXIL) 500 MG capsule Take  500 mg by mouth once as needed (dentist appointments). 01/07/21   [provider]  brimonidine (ALPHAGAN) 0.2 % ophthalmic solution Place 1 drop into the left eye in the morning and at bedtime.  09/10/17   [provider]  budesonide (ENTOCORT EC) 3 MG 24 hr capsule Take 1 capsule (3 mg total) by mouth in the morning, at noon, and at bedtime. 03/05/21 05/04/21  Cirigliano, Vito V, DO  Cholecalciferol (VITAMIN D3) 5000 units CAPS Take 10,000 Units by mouth every other day.     [provider]  clobetasol (TEMOVATE) 0.05 % external solution APPLY 1 APPLICATION TOPICALLY TWICE DAILY 11/20/16   [name], MD  dabigatran (PRADAXA) 150 MG CAPS capsule Take 1 capsule (150 mg total) by mouth 2 (two) times daily. 03/25/21   Shawna Clamp, MD  furosemide (LASIX) 20 MG tablet Take 1 tablet (20 mg total) by mouth daily. 09/16/20   [name], MD  hydrocortisone 2.5 % lotion Apply 1 application topically 2 (two) times daily as needed (dry skin). 09/04/20   [provider]  latanoprost (XALATAN) 0.005 % ophthalmic solution Place 1 drop into the left eye at bedtime.     [provider]  levalbuterol Penne Lash HFA) 45 MCG/ACT inhaler Inhale 2 puffs into the lungs every 4 (four) hours as needed for wheezing. 06/05/20 06/05/21  Saguier, Percell Miller, PA-C  magnesium oxide (MAG-OX) 400 (240 Mg)  MG tablet TAKE 1 TABLET(400 MG) BY MOUTH THREE TIMES DAILY AS NEEDED 02/24/21   [name], MD  Melatonin 3 MG CAPS Take 3 mg by mouth at bedtime.    [provider]  metoprolol tartrate (LOPRESSOR) 25 MG tablet Take 1 tablet (25 mg total) by mouth 2 (two) times daily. 03/25/21   Shawna Clamp, MD  Multiple Vitamins-Minerals (ICAPS MV) TABS Take 1 tablet by mouth 2 (two) times daily.    [provider]  nitroGLYCERIN (NITROSTAT) 0.4 MG SL tablet Place 1 tablet (0.4 mg total) under the tongue every 5 (five) minutes as needed for chest pain. 04/14/20   Allie Bossier, MD  omeprazole (PRILOSEC OTC) 20 MG tablet Take 20 mg by mouth daily.    [provider]  potassium chloride (KLOR-CON) 10 MEQ tablet Take 1 tablet (10 mEq total) by mouth daily. 09/17/20   Evans Lance, MD  propafenone (RYTHMOL) 225 MG tablet TAKE 1 TABLET BY MOUTH  TWICE DAILY 11/12/20   Evans Lance, MD  Ranibizumab (LUCENTIS IO) Inject 1 Dose into the eye once as needed (Macular degeneration).    [provider]  tamsulosin (FLOMAX) 0.4 MG CAPS capsule Take 0.4 mg by mouth every evening.    [provider]    Allergies    Albumin (human), Polymyxin b-trimethoprim, Pseudoephedrine, Codeine, Guaiacol, Statins, Gabapentin, Meloxicam, Pseudoephedrine-guaifenesin, Rosuvastatin calcium, Tapentadol, Ciprofloxacin, Moxifloxacin, and Rofecoxib  Review of Systems   Review of Systems  Constitutional:  Negative for chills and fever.  HENT:  Negative for ear pain and sore throat.   Eyes:  Negative for pain and visual disturbance.  Respiratory:  Negative for cough and shortness of breath.   Cardiovascular:  Negative for chest pain and palpitations.  Gastrointestinal:  Negative for abdominal pain and vomiting.  Genitourinary:  Negative for dysuria and hematuria.  Musculoskeletal:  Negative for arthralgias and back pain.  Skin:  Negative for color change and rash.  Neurological:  Positive  for dizziness and light-headedness. Negative for tremors, seizures, syncope, speech difficulty, weakness and numbness.  All other systems reviewed and are negative.  Physical Exam Updated Vital Signs BP (!) 171/65   Pulse (!) 49   Temp 97.8 F (36.6 C) (Oral)   Resp 14   Ht 5\' 4"  (1.626 m)   Wt 75.4 kg   SpO2 99%   BMI 28.53 kg/m   Physical Exam Vitals and nursing note reviewed.  Constitutional:      Appearance: He is well-developed. He is ill-appearing (chronically).  HENT:     Head: Normocephalic and atraumatic.     Mouth/Throat:     Mouth: Mucous membranes are dry.     Pharynx: Oropharynx is clear. No pharyngeal swelling or posterior oropharyngeal erythema.  Eyes:     General: No visual field deficit.    Extraocular Movements: Extraocular movements intact.     Conjunctiva/sclera: Conjunctivae normal.     Pupils: Pupils are equal.     Right eye: Pupil is round and reactive.     Left eye: Pupil is round and reactive.  Cardiovascular:     Rate and Rhythm: Normal rate and regular rhythm.     Pulses:          Radial pulses are 2+ on the right side and 2+ on the left side.     Heart sounds: Normal heart sounds. No murmur heard. Pulmonary:     Effort: Pulmonary effort is normal. No tachypnea or respiratory distress.     Breath sounds: Normal breath sounds. No decreased air movement.  Abdominal:     General: Abdomen is flat.     Palpations: Abdomen is soft.     Tenderness: There is no abdominal tenderness. There is no guarding or rebound.  Musculoskeletal:     Cervical back: Full passive range of motion without pain, normal range of motion and neck supple. No rigidity. No spinous process tenderness or muscular tenderness.     Right lower leg: No edema.     Left lower leg: No edema.  Skin:    General: Skin is warm and dry.  Neurological:     General: No focal deficit present.     Mental Status: He is alert and oriented to person, place, and time.     GCS: GCS eye  subscore is 4. GCS verbal subscore is 5. GCS motor subscore is 6.     Cranial Nerves: Cranial nerves are intact. No cranial nerve deficit, dysarthria or facial asymmetry.     Sensory: Sensation is intact.     Motor: Motor function is intact. No weakness, tremor, abnormal muscle tone or pronator drift.     Coordination: Finger-Nose-Finger Test and Heel to [employer] normal.  Psychiatric:        Behavior: Behavior is cooperative.    ED Results / Procedures / Treatments   Labs (all labs ordered are listed, but only abnormal results are displayed) Labs Reviewed  CBC WITH DIFFERENTIAL/PLATELET - Abnormal; Notable for the following components:      Result Value   RBC 4.13 (*)    Hemoglobin 12.8 (*)    HCT 37.7 (*)    All other components within normal limits  COMPREHENSIVE METABOLIC PANEL - Abnormal; Notable for the following components:   Sodium 132 (*)    CO2 18 (*)    Creatinine, Ser 1.30 (*)    Calcium 8.7 (*)    Total Protein 6.2 (*)    Albumin 3.3 (*)    GFR, Estimated 58 (*)    All other components within normal limits  MAGNESIUM - Abnormal; Notable for the following components:   Magnesium 1.4 (*)    All other components within normal limits  RESP PANEL BY RT-PCR (FLU A&B, COVID) ARPGX2  BRAIN NATRIURETIC PEPTIDE  TROPONIN I (HIGH SENSITIVITY)  TROPONIN I (HIGH SENSITIVITY)    EKG EKG Interpretation  Date/Time:  Thursday March 27 2021 21:14:00 EDT Ventricular Rate:  56 PR Interval:  223 QRS Duration: 101 QT Interval:  439 QTC Calculation: 424 R Axis:   -20 Text Interpretation: Sinus rhythm Prolonged PR interval Borderline left axis deviation Abnormal R-wave progression, early transition Minimal ST depression, inferior leads Confirmed by Lennice Sites ([id number]) on 03/27/2021 9:15:23 PM Also confirmed by Lennice Sites [phone]), editor Mariam Dollar [phone])  on 03/28/2021 10:17:11 AM  Radiology CT Head Wo Contrast  Result Date: 03/27/2021 CLINICAL DATA:  75 year old male with  dizziness. EXAM: CT HEAD WITHOUT CONTRAST TECHNIQUE: Contiguous axial images were obtained from the base of the skull through the vertex without intravenous contrast. COMPARISON:  Head CT dated 03/23/2021. FINDINGS: Brain: Mild age-related atrophy and chronic microvascular ischemic changes. There is no acute intracranial hemorrhage. No mass effect or midline shift. No extra-axial fluid collection. Vascular: No hyperdense vessel or unexpected calcification. Skull: Normal. Negative for fracture or focal lesion. Sinuses/Orbits: No acute finding. Other: None IMPRESSION: 1. No acute intracranial pathology. 2. Mild age-related atrophy and chronic microvascular ischemic changes. Electronically Signed   By: Anner Crete M.D.   On: 03/27/2021 23:16   DG Chest Portable 1 View  Result Date: 03/27/2021 CLINICAL DATA:  Shortness of breath EXAM: PORTABLE CHEST 1 VIEW COMPARISON:  08/26/2020 FINDINGS: Surgical hardware in the cervical spine. No focal opacity or pleural effusion. Emphysematous disease. Stable cardiomediastinal silhouette. IMPRESSION: No active disease.  Emphysema Electronically Signed   By: Donavan Foil M.D.   On: 03/27/2021 22:32    Procedures Procedures   Medications Ordered in ED Medications  acetaminophen (TYLENOL) tablet 500 mg (500 mg Oral Given 03/27/21 2231)  lactated ringers bolus 500 mL (0 mLs Intravenous Stopped 03/28/21 0041)  magnesium oxide (MAG-OX) tablet 400 mg (400 mg Oral Given 03/27/21 2342)    ED Course  I have reviewed the triage vital signs and the nursing notes.  Pertinent labs & imaging results that were available during my care of the patient were reviewed by me and considered in my medical decision making (see chart for details).  Clinical Course as of 03/28/21 2345  Thu Mar 27, 2021  2325 CBC with Differential(!) Hgb stable from a few days ago. No leukocytosis to suggest infection [ZB]  2325 DG Chest Portable 1 View Heart and lungs within normal limits. No  evidence of PNA or pulmonary edema.  [ZB]  2326 CT Head Wo Contrast Reassuring. No indication for MR brain or further neuroimaging at this time given reassuring and unchanged neurologic exam. [ZB]    Clinical Course User Index [ZB] Pearson Grippe, DO   MDM Rules/Calculators/A&P                          This is a 75 year old male with multiple comorbidities as above who presented to the emergency department after an episode of vague lightheadedness that occurred today around 7 hours prior to arrival.  Since the onset he has had improvement in symptoms. In the emergency department he was hemodynamically stable and afebrile.  He had a nonfocal neurologic exam.  Particularly, no evidence of ataxia. I reviewed recent hospitalization for CVA work-up which  included negative MRI brain, unchanged echocardiogram.  CT head ordered to ensure no new evidence of intracranial process however lower suspicion based on reassuring and nonfocal neurologic exam. Could have been an ACS equivalent.  EKG as above without any evidence of acute ischemia.  Ordered for delta troponins. Low index of suspicion for pneumonia or other infectious causes given normal state of health earlier today prior to the episode. No history that would suggest GI bleed.  Ordered for CBC to trend hemoglobin from recent admission.  He appears hypovolemic on my exam- could be an element relative dehydration. Ordered for metabolic workup and IV crystalloid for treatment in the ED. Also considering arrhythmia.  Check electrolytes.  See clinical course above for further medical decision-making. No active chest pain, shortness of breath.  Patient largely asymptomatic after treatment with IV fluids. Delta troponins negative.  After shared decision-making discussion with the patient and his family, they prefer going home and outpatient management which is appropriate at this time.  Final Clinical Impression(s) / ED Diagnoses Final diagnoses:   Dizziness  Nonspecific chest pain    Rx / DC Orders ED Discharge Orders     None        Pearson Grippe, DO 03/28/21 [place], [place], DO 03/31/21 1557

## 2021-03-27 NOTE — Telephone Encounter (Signed)
We received a phone call from this patient to schedule his hospital follow up. He is currently scheduled for a 6 week follow up with Janett Billow, but he is concerned because he was only prescribed 4 weeks of Pradaxa 150 mg when he was released from the hospital.  Can you please advise as the patient will run out of this prior to his appointment here.

## 2021-03-28 ENCOUNTER — Telehealth (INDEPENDENT_AMBULATORY_CARE_PROVIDER_SITE_OTHER): Payer: Medicare Other | Admitting: Family Medicine

## 2021-03-28 VITALS — BP 145/72 | HR 59

## 2021-03-28 DIAGNOSIS — R197 Diarrhea, unspecified: Secondary | ICD-10-CM

## 2021-03-28 DIAGNOSIS — I10 Essential (primary) hypertension: Secondary | ICD-10-CM | POA: Diagnosis not present

## 2021-03-28 DIAGNOSIS — D649 Anemia, unspecified: Secondary | ICD-10-CM | POA: Diagnosis not present

## 2021-03-28 DIAGNOSIS — R739 Hyperglycemia, unspecified: Secondary | ICD-10-CM | POA: Diagnosis not present

## 2021-03-28 DIAGNOSIS — R2990 Unspecified symptoms and signs involving the nervous system: Secondary | ICD-10-CM | POA: Diagnosis not present

## 2021-03-28 LAB — TROPONIN I (HIGH SENSITIVITY): Troponin I (High Sensitivity): 14 ng/L (ref <18)

## 2021-03-28 NOTE — Discharge Instructions (Addendum)
Follow-up with your neurologist as scheduled.  Continue to take your medications as prescribed.  Recommend that you follow-up with your primary care doctor.   Please return if your symptoms are worsening.

## 2021-03-28 NOTE — ED Provider Notes (Signed)
I have personally seen and examined the patient. I have reviewed the documentation on PMH/FH/Soc Hx. I have discussed the plan of care with the resident and patient.  I have reviewed and agree with the resident's documentation. Please see associated encounter note.  Briefly, the patient is a 75 y.o. male here with dizziness, chest pain started earlier this afternoon and is now resolved.  Patient states that he was recently admitted for TIA.  Was switched from Eliquis to Pradaxa.  He thinks that he might be having side effects of the medication because symptoms started after his first dose.  He was slightly orthostatic with EMS.  Here his blood pressure is overall unremarkable.  Orthostatics with myself are unremarkable.  He was given 500 normal saline bolus.  EKG shows sinus rhythm.  No obvious ischemic changes.  First troponin is normal at 13.  He is not having any active chest pain.  Neurologically he appears to be intact.  Neuro exam is normal.  He was having left lower leg weakness with this TIA last week.  He states that his symptoms lasted for 10 hours and then fully resolved.  He is supposed to work with physical therapy next week.  Patient had no significant leukocytosis, anemia, electrolyte abnormality.  Creatinine at baseline.  Repeat head CT today is stable.  No head bleed.  No obvious infarct.  Chest x-ray without any signs of infection.  COVID test and flu test is negative.  Overall have a low suspicion for ACS or PE.  Will get second troponin.  Do not think he is had a stroke or mini stroke.  He is feeling better after IV fluids and asymptomatic throughout my care.  He is nervous about continuing to take Pradaxa but encouraged him to continue to take this medicine and follow-up with neurology.  Patient to be discharged assuming troponin will be normal.  He has follow-up with neurology in place.  He understands return precautions.  This chart was dictated using voice recognition software.  Despite  best efforts to proofread,  errors can occur which can change the documentation meaning.    EKG Interpretation  Date/Time:  Thursday March 27 2021 21:14:00 EDT Ventricular Rate:  56 PR Interval:  223 QRS Duration: 101 QT Interval:  439 QTC Calculation: 424 R Axis:   -20 Text Interpretation: Sinus rhythm Prolonged PR interval Borderline left axis deviation Abnormal R-wave progression, early transition Minimal ST depression, inferior leads Confirmed by Lennice Sites ([id number]) on 03/27/2021 9:15:23 PM          Lennice Sites, DO 03/28/21 0045

## 2021-03-30 NOTE — Assessment & Plan Note (Signed)
Is working with gastroenterology and was recently started on Budesonide and he notes his stool are finally firming up

## 2021-03-30 NOTE — Progress Notes (Signed)
Virtual telephone visit    Virtual Visit via Telephone Note   This visit type was conducted due to national recommendations for restrictions regarding the COVID-19 Pandemic (e.g. social distancing) in an effort to limit this patient's exposure and mitigate transmission in our community. Due to his co-morbid illnesses, this patient is at least at moderate risk for complications without adequate follow up. This format is felt to be most appropriate for this patient at this time. The patient did not have access to video technology or had technical difficulties with video requiring transitioning to audio format only (telephone). Physical exam was limited to content and character of the telephone converstion. S Chism, CMA was able to get the patient set up on a telephone visit.   Patient location: home Patient and provider in visit Provider location: Office  I discussed the limitations of evaluation and management by telemedicine and the availability of in person appointments. The patient expressed understanding and agreed to proceed.   Visit Date: 03/28/2021  Today's healthcare provider: Penni Homans, MD     Subjective:    Patient ID: Ryan [name], male    DOB: 1945/11/10, 75 y.o.   MRN: [id number]  No chief complaint on file.   HPI Patient is in today for hospital follow up. No recent febrile illness but he has had to present to the hospital twice recently. He feels well today but presented to hospital initially with stroke like symptoms most notable left leg weakness that lasted about 12 hours, has now completely resolved and not recurred. During that hospital visit he was switched from Eliquis to Pradaxa and was set up with follow up with [facility], will see Dr Leonie Man soon. After switching to Pradaxa he began having more episodes of feeling dizzy and was afraid that was the cause but in retrospect he realizes he overworked himself too quickly. He was likely  dehydrated and when he got fluids at the hospital he began to feel better. Denies CP/palp/SOB/HA/congestion/fevers/GI or GU c/o. Taking meds as prescribed. He notes his diarrhea has improved recently with the Budesonide.   Past Medical History:  Diagnosis Date   A-fib ([place]) 03/22/2018   Anemia    CAD (coronary artery disease)    reportedly moderate CAD; managed medically   Cataract    removed from both eyes   CHF (congestive heart failure) (H[facility])    COPD (chronic obstructive pulmonary disease) ([place])    Eczema 08/03/2014   GERD (gastroesophageal reflux disease)    H/O: rheumatic fever    Heart murmur    High risk medication use    on amiodarone since 03/31/2012   History of colonoscopy    Hyperglycemia 06/21/2017   Hyperlipidemia    Hypertension    Kidney stone 06/21/2017   Lobar pneumonia ([place]) 03/22/2018   Low vitamin D level 06/21/2017   Macular degeneration    Osteoporosis 05/31/2016    Past Surgical History:  Procedure Laterality Date   APPENDECTOMY     CARDIAC CATHETERIZATION  04/22/2011   moderate left main and RCA stenosis not significant by FFR and IVUS on medical therapy   CARDIOVERSION N/A 04/25/2018   Procedure: CARDIOVERSION;  Surgeon: Skeet Latch, MD;  Location: [facility];  Service: Cardiovascular;  Laterality: N/A;   CERVICAL DISCECTOMY     C5,6,7 disc fused  with plate and 5 1" screws   CHOLECYSTECTOMY     colon polyp removal  3/1.19   COLONOSCOPY     FOOT SURGERY  right calcification removed from top of foot   knee cartiledge Right    right knee   MOUTH SURGERY     periodontal surgery, bridges, splint in front,    UPPER GASTROINTESTINAL ENDOSCOPY      Family History  Problem Relation Age of Onset   Heart disease Mother    Diabetes Mother    Cirrhosis Mother    Emphysema Mother        never smoked but 2nd hand through her spouse   Hypertension Mother    Macular degeneration Mother    Heart disease Father    Cancer Father        prostate    Hyperlipidemia Father    Hypertension Father    Varicose Veins Father    Heart attack Father    Peripheral vascular disease Father    Heart disease Sister    Arthritis Sister    Hyperlipidemia Sister    Obesity Sister    Macular degeneration Sister    Heart disease Brother        5 stents   Hyperlipidemia Brother    Macular degeneration Maternal Grandfather    Cirrhosis Sister    Obesity Sister    Arthritis Sister    Heart disease Sister    Obesity Sister    Liver disease Other    Prostate cancer Other    Coronary artery disease Other    Colon cancer Neg Hx    Esophageal cancer Neg Hx    Rectal cancer Neg Hx    Stomach cancer Neg Hx     Social History   Socioeconomic History   Marital status: Married    Spouse name: Not on file   Number of children: 0   Years of education: Not on file   Highest education level: Not on file  Occupational History   Occupation: retired    Fish farm manager: RETIRED  Tobacco Use   Smoking status: Former    Packs/day: 0.50    Years: 50.00    Pack years: 25.00    Types: Cigarettes    Quit date: 05/01/2009    Years since quitting: 11.9   Smokeless tobacco: Former    Types: Nurse, children's Use: Never used  Substance and Sexual Activity   Alcohol use: Yes    Alcohol/week: 1.0 standard drink    Types: 1 Cans of beer per week    Comment: 1 beer daily   Drug use: No   Sexual activity: Yes    Comment: lives with wife, no dietary restrictions  Other Topics Concern   Not on file  Social History Narrative   Not on file   Social Determinants of Health   Financial Resource Strain: Not on file  Food Insecurity: Not on file  Transportation Needs: Not on file  Physical Activity: Not on file  Stress: Not on file  Social Connections: Not on file  Intimate Partner Violence: Not on file    Outpatient Medications Prior to Visit  Medication Sig Dispense Refill   amoxicillin (AMOXIL) 500 MG capsule Take 500 mg by mouth once as needed  (dentist appointments).     brimonidine (ALPHAGAN) 0.2 % ophthalmic solution Place 1 drop into the left eye in the morning and at bedtime.   7   budesonide (ENTOCORT EC) 3 MG 24 hr capsule Take 1 capsule (3 mg total) by mouth in the morning, at noon, and at bedtime. 180 capsule 0   Cholecalciferol (VITAMIN D3)  5000 units CAPS Take 10,000 Units by mouth every other day.      clobetasol (TEMOVATE) 0.05 % external solution APPLY 1 APPLICATION TOPICALLY TWICE DAILY 100 mL 3   dabigatran (PRADAXA) 150 MG CAPS capsule Take 1 capsule (150 mg total) by mouth 2 (two) times daily. 60 capsule 3   furosemide (LASIX) 20 MG tablet Take 1 tablet (20 mg total) by mouth daily. 90 tablet 3   hydrocortisone 2.5 % lotion Apply 1 application topically 2 (two) times daily as needed (dry skin).     latanoprost (XALATAN) 0.005 % ophthalmic solution Place 1 drop into the left eye at bedtime.      levalbuterol (XOPENEX HFA) 45 MCG/ACT inhaler Inhale 2 puffs into the lungs every 4 (four) hours as needed for wheezing. 45 g 2   magnesium oxide (MAG-OX) 400 (240 Mg) MG tablet TAKE 1 TABLET(400 MG) BY MOUTH THREE TIMES DAILY AS NEEDED 90 tablet 1   Melatonin 3 MG CAPS Take 3 mg by mouth at bedtime.     metoprolol tartrate (LOPRESSOR) 25 MG tablet Take 1 tablet (25 mg total) by mouth 2 (two) times daily. 180 tablet 3   Multiple Vitamins-Minerals (ICAPS MV) TABS Take 1 tablet by mouth 2 (two) times daily.     nitroGLYCERIN (NITROSTAT) 0.4 MG SL tablet Place 1 tablet (0.4 mg total) under the tongue every 5 (five) minutes as needed for chest pain. 30 tablet 0   omeprazole (PRILOSEC OTC) 20 MG tablet Take 20 mg by mouth daily.     potassium chloride (KLOR-CON) 10 MEQ tablet Take 1 tablet (10 mEq total) by mouth daily. 90 tablet 2   propafenone (RYTHMOL) 225 MG tablet TAKE 1 TABLET BY MOUTH  TWICE DAILY 180 tablet 2   Ranibizumab (LUCENTIS IO) Inject 1 Dose into the eye once as needed (Macular degeneration).     tamsulosin (FLOMAX)  0.4 MG CAPS capsule Take 0.4 mg by mouth every evening.     No facility-administered medications prior to visit.    Allergies  Allergen Reactions   Albumin (Human) Anaphylaxis   Polymyxin B-Trimethoprim Swelling    Eye drops made eyes swell   Pseudoephedrine Other (See Comments)    Stomach cramps   Codeine Hives, Itching and Rash   Guaiacol Other (See Comments)    Hallucinations   Statins Other (See Comments)    Muscle cramps    Gabapentin Other (See Comments)    unk   Meloxicam Other (See Comments)    Unknown reaction   Pseudoephedrine-Guaifenesin Nausea And Vomiting    Stomach cramps   Rosuvastatin Calcium Other (See Comments)    Muscle aches   Tapentadol Other (See Comments)    Unknown reaction   Ciprofloxacin Hives, Itching, Nausea Only and Rash   Moxifloxacin Nausea Only and Other (See Comments)    Headaches, stomach cramps    Rofecoxib Other (See Comments)    Stomach cramping    Review of Systems  Constitutional:  Positive for malaise/fatigue. Negative for fever.  HENT:  Negative for congestion.   Eyes:  Negative for blurred vision.  Respiratory:  Negative for shortness of breath.   Cardiovascular:  Negative for chest pain, palpitations and leg swelling.  Gastrointestinal:  Negative for abdominal pain, blood in stool and nausea.  Genitourinary:  Negative for dysuria and frequency.  Musculoskeletal:  Negative for falls.  Skin:  Negative for rash.  Neurological:  Positive for dizziness and focal weakness. Negative for seizures, loss of consciousness and headaches.  Endo/Heme/Allergies:  Negative for environmental allergies.  Psychiatric/Behavioral:  Negative for depression. The patient is not nervous/anxious.       Objective:    Physical Exam unable to obtain via phone visit.   BP (!) 145/72   Pulse (!) 59   SpO2 97%  Wt Readings from Last 3 Encounters:  03/27/21 166 lb 3.6 oz (75.4 kg)  03/23/21 166 lb 3.6 oz (75.4 kg)  03/05/21 170 lb (77.1 kg)     Diabetic Foot Exam - Simple   No data filed    Lab Results  Component Value Date   WBC 9.8 03/27/2021   HGB 12.8 (L) 03/27/2021   HCT 37.7 (L) 03/27/2021   PLT 188 03/27/2021   GLUCOSE 88 03/27/2021   CHOL 289 (H) 03/24/2021   TRIG 80 03/24/2021   HDL 51 03/24/2021   LDLDIRECT 134.2 12/01/2013   LDLCALC 222 (H) 03/24/2021   ALT 12 03/27/2021   AST 16 03/27/2021   NA 132 (L) 03/27/2021   K 4.1 03/27/2021   CL 103 03/27/2021   CREATININE 1.30 (H) 03/27/2021   BUN 22 03/27/2021   CO2 18 (L) 03/27/2021   TSH 2.38 03/04/2021   PSA 1.80 03/04/2021   INR 1.3 (H) 03/23/2021   HGBA1C 5.5 03/24/2021    Lab Results  Component Value Date   TSH 2.38 03/04/2021   Lab Results  Component Value Date   WBC 9.8 03/27/2021   HGB 12.8 (L) 03/27/2021   HCT 37.7 (L) 03/27/2021   MCV 91.3 03/27/2021   PLT 188 03/27/2021   Lab Results  Component Value Date   NA 132 (L) 03/27/2021   K 4.1 03/27/2021   CO2 18 (L) 03/27/2021   GLUCOSE 88 03/27/2021   BUN 22 03/27/2021   CREATININE 1.30 (H) 03/27/2021   BILITOT 1.0 03/27/2021   ALKPHOS 52 03/27/2021   AST 16 03/27/2021   ALT 12 03/27/2021   PROT 6.2 (L) 03/27/2021   ALBUMIN 3.3 (L) 03/27/2021   CALCIUM 8.7 (L) 03/27/2021   ANIONGAP 11 03/27/2021   GFR 53.76 (L) 03/04/2021   Lab Results  Component Value Date   CHOL 289 (H) 03/24/2021   Lab Results  Component Value Date   HDL 51 03/24/2021   Lab Results  Component Value Date   LDLCALC 222 (H) 03/24/2021   Lab Results  Component Value Date   TRIG 80 03/24/2021   Lab Results  Component Value Date   CHOLHDL 5.7 03/24/2021   Lab Results  Component Value Date   HGBA1C 5.5 03/24/2021       Assessment & Plan:   Problem List Items Addressed This Visit     Essential hypertension - Primary    Well controlled, no changes to meds. Encouraged heart healthy diet such as the DASH diet and exercise as tolerated.        Relevant Orders   Comprehensive metabolic  panel   Hyperglycemia    hgba1c acceptable, minimize simple carbs. Increase exercise as tolerated.        Hypomagnesemia    Was given treatment at hospital and has restarted his home magnesium recheck level next week       Relevant Orders   Magnesium   Diarrhea    Is working with gastroenterology and was recently started on Budesonide and he notes his stool are finally firming up       Stroke-like symptoms    These have resolved but he did present to ER with left leg  weakness he reports came on suddenly and resolved in roughly 12 hours. He has an appointment with [facility] Dr Leonie Man soon. He was switched form Eliquis to Pradaxa. He is afraid he is having side effects such as dizziness which brought him to the ER again but those symptoms did improve with IVF so likely some degree of dehydration was involved. Encouraged to stay hydrated and take it easy for the next few days. Seek care if symptoms recur. Spent 30 minutes discussing recent history, symptoms and plan of care       Other Visit Diagnoses     Anemia, unspecified type       Relevant Orders   CBC with Differential/Platelet       I am having Ryan [name] "Ryan [name]" maintain his ICaps MV, omeprazole, Ranibizumab (LUCENTIS IO), latanoprost, clobetasol, brimonidine, Vitamin D3, nitroGLYCERIN, levalbuterol, tamsulosin, furosemide, potassium chloride, propafenone, Melatonin, hydrocortisone, amoxicillin, magnesium oxide, budesonide, dabigatran, and metoprolol tartrate.  No orders of the defined types were placed in this encounter.    I discussed the assessment and treatment plan with the patient. The patient was provided an opportunity to ask questions and all were answered. The patient agreed with the plan and demonstrated an understanding of the instructions.   The patient was advised to call back or seek an in-person evaluation if the symptoms worsen or if the condition fails to improve as anticipated.  I provided 30 minutes of  non-face-to-face time during this encounter.   Penni Homans, MD [facility] [phone] (phone) [phone] (fax)  [place]

## 2021-03-30 NOTE — Assessment & Plan Note (Signed)
Well controlled, no changes to meds. Encouraged heart healthy diet such as the DASH diet and exercise as tolerated.  °

## 2021-03-30 NOTE — Assessment & Plan Note (Signed)
These have resolved but he did present to ER with left leg weakness he reports came on suddenly and resolved in roughly 12 hours. He has an appointment with [facility] Dr Leonie Man soon. He was switched form Eliquis to Pradaxa. He is afraid he is having side effects such as dizziness which brought him to the ER again but those symptoms did improve with IVF so likely some degree of dehydration was involved. Encouraged to stay hydrated and take it easy for the next few days. Seek care if symptoms recur. Spent 30 minutes discussing recent history, symptoms and plan of care

## 2021-03-30 NOTE — Assessment & Plan Note (Signed)
Was given treatment at hospital and has restarted his home magnesium recheck level next week

## 2021-03-30 NOTE — Assessment & Plan Note (Signed)
hgba1c acceptable, minimize simple carbs. Increase exercise as tolerated.  

## 2021-03-31 ENCOUNTER — Other Ambulatory Visit: Payer: Medicare Other

## 2021-03-31 ENCOUNTER — Other Ambulatory Visit (INDEPENDENT_AMBULATORY_CARE_PROVIDER_SITE_OTHER): Payer: Medicare Other

## 2021-03-31 ENCOUNTER — Other Ambulatory Visit: Payer: Self-pay

## 2021-03-31 DIAGNOSIS — I10 Essential (primary) hypertension: Secondary | ICD-10-CM | POA: Diagnosis not present

## 2021-03-31 DIAGNOSIS — D649 Anemia, unspecified: Secondary | ICD-10-CM | POA: Diagnosis not present

## 2021-03-31 LAB — COMPREHENSIVE METABOLIC PANEL
ALT: 10 U/L (ref 0–53)
AST: 13 U/L (ref 0–37)
Albumin: 4.1 g/dL (ref 3.5–5.2)
Alkaline Phosphatase: 58 U/L (ref 39–117)
BUN: 21 mg/dL (ref 6–23)
CO2: 30 mEq/L (ref 19–32)
Calcium: 9.8 mg/dL (ref 8.4–10.5)
Chloride: 99 mEq/L (ref 96–112)
Creatinine, Ser: 1.31 mg/dL (ref 0.40–1.50)
GFR: 53.73 mL/min — ABNORMAL LOW (ref >60.00)
Glucose, Bld: 69 mg/dL — ABNORMAL LOW (ref 70–99)
Potassium: 4.4 mEq/L (ref 3.5–5.1)
Sodium: 139 mEq/L (ref 135–145)
Total Bilirubin: 0.7 mg/dL (ref 0.2–1.2)
Total Protein: 6.9 g/dL (ref 6.0–8.3)

## 2021-03-31 LAB — CBC WITH DIFFERENTIAL/PLATELET
Basophils Absolute: 0.1 10*3/uL (ref 0.0–0.1)
Basophils Relative: 0.8 % (ref 0.0–3.0)
Eosinophils Absolute: 0.1 10*3/uL (ref 0.0–0.7)
Eosinophils Relative: 1.1 % (ref 0.0–5.0)
HCT: 40.4 % (ref 39.0–52.0)
Hemoglobin: 13.9 g/dL (ref 13.0–17.0)
Lymphocytes Relative: 23.8 % (ref 12.0–46.0)
Lymphs Abs: 2.1 10*3/uL (ref 0.7–4.0)
MCHC: 34.4 g/dL (ref 30.0–36.0)
MCV: 90.2 fl (ref 78.0–100.0)
Monocytes Absolute: 0.9 10*3/uL (ref 0.1–1.0)
Monocytes Relative: 9.9 % (ref 3.0–12.0)
Neutro Abs: 5.6 10*3/uL (ref 1.4–7.7)
Neutrophils Relative %: 64.4 % (ref 43.0–77.0)
Platelets: 192 10*3/uL (ref 150.0–400.0)
RBC: 4.48 Mil/uL (ref 4.22–5.81)
RDW: 13.9 % (ref 11.5–15.5)
WBC: 8.6 10*3/uL (ref 4.0–10.5)

## 2021-03-31 LAB — MAGNESIUM: Magnesium: 1.7 mg/dL (ref 1.5–2.5)

## 2021-04-01 DIAGNOSIS — R531 Weakness: Secondary | ICD-10-CM | POA: Diagnosis not present

## 2021-04-03 DIAGNOSIS — Z9889 Other specified postprocedural states: Secondary | ICD-10-CM | POA: Diagnosis not present

## 2021-04-03 DIAGNOSIS — H353231 Exudative age-related macular degeneration, bilateral, with active choroidal neovascularization: Secondary | ICD-10-CM | POA: Diagnosis not present

## 2021-04-03 DIAGNOSIS — Z9842 Cataract extraction status, left eye: Secondary | ICD-10-CM | POA: Diagnosis not present

## 2021-04-03 DIAGNOSIS — Z9841 Cataract extraction status, right eye: Secondary | ICD-10-CM | POA: Diagnosis not present

## 2021-04-03 DIAGNOSIS — Z961 Presence of intraocular lens: Secondary | ICD-10-CM | POA: Diagnosis not present

## 2021-04-27 ENCOUNTER — Other Ambulatory Visit: Payer: Self-pay | Admitting: Family Medicine

## 2021-04-27 DIAGNOSIS — R790 Abnormal level of blood mineral: Secondary | ICD-10-CM

## 2021-05-01 ENCOUNTER — Other Ambulatory Visit: Payer: Self-pay | Admitting: Gastroenterology

## 2021-05-05 ENCOUNTER — Ambulatory Visit (INDEPENDENT_AMBULATORY_CARE_PROVIDER_SITE_OTHER): Payer: Medicare Other | Admitting: Adult Health

## 2021-05-05 ENCOUNTER — Encounter: Payer: Self-pay | Admitting: Adult Health

## 2021-05-05 VITALS — BP 150/68 | HR 49 | Ht 64.0 in | Wt 173.4 lb

## 2021-05-05 DIAGNOSIS — I10 Essential (primary) hypertension: Secondary | ICD-10-CM

## 2021-05-05 DIAGNOSIS — G459 Transient cerebral ischemic attack, unspecified: Secondary | ICD-10-CM | POA: Diagnosis not present

## 2021-05-05 DIAGNOSIS — I480 Paroxysmal atrial fibrillation: Secondary | ICD-10-CM

## 2021-05-05 DIAGNOSIS — E785 Hyperlipidemia, unspecified: Secondary | ICD-10-CM | POA: Diagnosis not present

## 2021-05-05 NOTE — Progress Notes (Signed)
[facility] [address] [place]. [place] [address] [phone]       HOSPITAL FOLLOW UP NOTE  [name] Date of Birth:  October 21, 1945 Medical Record Number:  [id number]   Reason for Referral:  hospital stroke follow up    SUBJECTIVE:   CHIEF COMPLAINT:  Chief Complaint  Patient presents with   Cerebrovascular Accident    Rm 2 ED referral, wife- Ryan [name],  "doing well"    HPI:   Ryan [name] is a 75 y.o. male with a PMHx of atrial fibrillation (on Eliquis), CAD, CHF, rheumatic fever as a child, heart murmur, HLD and HTN, who presented to the ED on 03/23/2021 after experiencing acute onset of LLE weakness at noon. He had been bicycling and afterwards was in his workshop when he experienced sudden loss of movement of his left leg.  Personally reviewed hospitalization pertinent progress notes, lab work and imaging with summary provided.  Evaluated by Dr. Leonie Man for strokelike episode possible TIA in setting of known PAF.  MR brain negative.  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  Carotid duplex 1 to 39% stenosis.  EF 50 to 55%.  On Eliquis PTA recommend potentially switching to Pradaxa for secondary stroke prevention measures.  LDL 221 with history of statin intolerance and consider Repatha or Praluent outpatient.  A1c 5.5.  PT/OT recommended outpatient PT.  Today, 05/05/2021, Ryan [name] is being seen for hospital follow-up accompanied by his wife.  Stable from stroke standpoint without new or reoccurring stroke/TIA symptoms.  He did present to ED on 6/23 with dizziness which improved after IVF.  He has not had any additional symptoms since that time.  He has remained on Pradaxa without associated side effects.  Blood pressure today 150/68. F/u visit with Dr. Lovena Le 8/12.  He does have history of right leg radiculopathy receiving injections every 4 to 5 months by [place] [facility].  Routinely followed by ophthalmology for  macular degeneration receiving Lucentis injections routinely - pt reports discussing increased risk of stroke with Lucentis injections with ophthalmologist at recent visit but per OV note, patient wished to continue receiving injections.  No further concerns at this time.    ROS:   14 system review of systems performed and negative with exception of those listed in HPI  PMH:  Past Medical History:  Diagnosis Date   A-fib ([place]) 03/22/2018   Anemia    CAD (coronary artery disease)    reportedly moderate CAD; managed medically   Cataract    removed from both eyes   CHF (congestive heart failure) ([place])    COPD (chronic obstructive pulmonary disease) ([place])    Eczema 08/03/2014   GERD (gastroesophageal reflux disease)    H/O: rheumatic fever    Heart murmur    High risk medication use    on amiodarone since 03/31/2012   History of colonoscopy    Hyperglycemia 06/21/2017   Hyperlipidemia    Hypertension    Kidney stone 06/21/2017   Lobar pneumonia ([place]) 03/22/2018   Low vitamin D level 06/21/2017   Macular degeneration    Osteoporosis 05/31/2016    PSH:  Past Surgical History:  Procedure Laterality Date   APPENDECTOMY     CARDIAC CATHETERIZATION  04/22/2011   moderate left main and RCA stenosis not significant by FFR and IVUS on medical therapy   CARDIOVERSION N/A 04/25/2018   Procedure: CARDIOVERSION;  Surgeon: Skeet Latch, MD;  Location: [place];  Service:  Cardiovascular;  Laterality: N/A;   CERVICAL DISCECTOMY     C5,6,7 disc fused  with plate and 5 1" screws   CHOLECYSTECTOMY     colon polyp removal  3/1.19   COLONOSCOPY     FOOT SURGERY     right calcification removed from top of foot   knee cartiledge Right    right knee   MOUTH SURGERY     periodontal surgery, bridges, splint in front,    UPPER GASTROINTESTINAL ENDOSCOPY      Social History:  Social History   Socioeconomic History   Marital status: Married    Spouse name: Ryan [name]   Number of children: 0    Years of education: Not on file   Highest education level: Not on file  Occupational History   Occupation: retired    Fish farm manager: RETIRED  Tobacco Use   Smoking status: Former    Packs/day: 0.50    Years: 50.00    Pack years: 25.00    Types: Cigarettes    Quit date: 05/01/2009    Years since quitting: 12.0   Smokeless tobacco: Former    Types: Nurse, children's Use: Never used  Substance and Sexual Activity   Alcohol use: Yes    Alcohol/week: 1.0 standard drink    Types: 1 Cans of beer per week    Comment: 1 beer daily   Drug use: No   Sexual activity: Yes    Comment: lives with wife, no dietary restrictions  Other Topics Concern   Not on file  Social History Narrative   Lives with wife   Social Determinants of Health   Financial Resource Strain: Not on file  Food Insecurity: Not on file  Transportation Needs: Not on file  Physical Activity: Not on file  Stress: Not on file  Social Connections: Not on file  Intimate Partner Violence: Not on file    Family History:  Family History  Problem Relation Age of Onset   Heart disease Mother    Diabetes Mother    Cirrhosis Mother    Emphysema Mother        never smoked but 2nd hand through her spouse   Hypertension Mother    Macular degeneration Mother    Heart disease Father    Cancer Father        prostate   Hyperlipidemia Father    Hypertension Father    Varicose Veins Father    Heart attack Father    Peripheral vascular disease Father    Heart disease Sister    Arthritis Sister    Hyperlipidemia Sister    Obesity Sister    Macular degeneration Sister    Heart disease Brother        5 stents   Hyperlipidemia Brother    Macular degeneration Maternal Grandfather    Cirrhosis Sister    Obesity Sister    Arthritis Sister    Heart disease Sister    Obesity Sister    Liver disease Other    Prostate cancer Other    Coronary artery disease Other    Colon cancer Neg Hx    Esophageal cancer Neg Hx     Rectal cancer Neg Hx    Stomach cancer Neg Hx     Medications:   Current Outpatient Medications on File Prior to Visit  Medication Sig Dispense Refill   amoxicillin (AMOXIL) 500 MG capsule Take 500 mg by mouth once as needed (dentist appointments).  brimonidine (ALPHAGAN) 0.2 % ophthalmic solution Place 1 drop into the left eye in the morning and at bedtime.   7   Cholecalciferol (VITAMIN D3) 5000 units CAPS Take 10,000 Units by mouth every other day.      clobetasol (TEMOVATE) 0.05 % external solution APPLY 1 APPLICATION TOPICALLY TWICE DAILY 100 mL 3   dabigatran (PRADAXA) 150 MG CAPS capsule Take 1 capsule (150 mg total) by mouth 2 (two) times daily. 60 capsule 3   furosemide (LASIX) 20 MG tablet Take 1 tablet (20 mg total) by mouth daily. 90 tablet 3   hydrocortisone 2.5 % lotion Apply 1 application topically 2 (two) times daily as needed (dry skin).     latanoprost (XALATAN) 0.005 % ophthalmic solution Place 1 drop into the left eye at bedtime.      levalbuterol (XOPENEX HFA) 45 MCG/ACT inhaler Inhale 2 puffs into the lungs every 4 (four) hours as needed for wheezing. 45 g 2   MAGNESIUM-OXIDE 400 (240 Mg) MG tablet TAKE 1 TABLET(400 MG) BY MOUTH THREE TIMES DAILY AS NEEDED 90 tablet 1   Melatonin 3 MG CAPS Take 3 mg by mouth at bedtime.     metoprolol tartrate (LOPRESSOR) 25 MG tablet Take 1 tablet (25 mg total) by mouth 2 (two) times daily. 180 tablet 3   Multiple Vitamins-Minerals (ICAPS AREDS 2 PO) Take by mouth.     nitroGLYCERIN (NITROSTAT) 0.4 MG SL tablet Place 1 tablet (0.4 mg total) under the tongue every 5 (five) minutes as needed for chest pain. 30 tablet 0   omeprazole (PRILOSEC OTC) 20 MG tablet Take 20 mg by mouth daily.     potassium chloride (KLOR-CON) 10 MEQ tablet Take 1 tablet (10 mEq total) by mouth daily. 90 tablet 2   propafenone (RYTHMOL) 225 MG tablet TAKE 1 TABLET BY MOUTH  TWICE DAILY 180 tablet 2   Ranibizumab (LUCENTIS IO) Inject 1 Dose into the eye once  as needed (Macular degeneration).     tamsulosin (FLOMAX) 0.4 MG CAPS capsule Take 0.4 mg by mouth every evening.     No current facility-administered medications on file prior to visit.    Allergies:   Allergies  Allergen Reactions   Albumin (Human) Anaphylaxis   Polymyxin B-Trimethoprim Swelling    Eye drops made eyes swell   Pseudoephedrine Other (See Comments)    Stomach cramps   Codeine Hives, Itching and Rash   Guaiacol Other (See Comments)    Hallucinations   Statins Other (See Comments)    Muscle cramps    Gabapentin Other (See Comments)    unk   Meloxicam Other (See Comments)    Unknown reaction   Pseudoephedrine-Guaifenesin Nausea And Vomiting    Stomach cramps   Rosuvastatin Calcium Other (See Comments)    Muscle aches   Tapentadol Other (See Comments)    Unknown reaction   Ciprofloxacin Hives, Itching, Nausea Only and Rash   Moxifloxacin Nausea Only and Other (See Comments)    Headaches, stomach cramps    Rofecoxib Other (See Comments)    Stomach cramping      OBJECTIVE:  Physical Exam  Vitals:   05/05/21 1452  BP: (!) 150/68  Pulse: (!) 49  Weight: 173 lb 6.4 oz (78.7 kg)  Height: 5\' 4"  (1.626 m)   Body mass index is 29.76 kg/m. No results found.  General: well developed, well nourished, very pleasant elderly Caucasian male, seated, in no evident distress Head: head normocephalic and atraumatic.   Neck: supple  with no carotid or supraclavicular bruits Cardiovascular: regular rate and rhythm, no murmurs Musculoskeletal: no deformity Skin:  no rash/petichiae Vascular:  Normal pulses all extremities   Neurologic Exam Mental Status: Awake and fully alert.  Fluent speech and language.  Oriented to place and time. Recent and remote memory intact. Attention span, concentration and fund of knowledge appropriate. Mood and affect appropriate.  Cranial Nerves: Pupils equal, briskly reactive to light. Extraocular movements full without nystagmus.  Visual fields full to confrontation. Hearing intact. Facial sensation intact. Face, tongue, palate moves normally and symmetrically.  Motor: Normal bulk and tone. Normal strength in all tested extremity muscles Sensory.: intact to touch , pinprick , position and vibratory sensation.  Coordination: Rapid alternating movements normal in all extremities. Finger-to-nose and heel-to-shin performed accurately bilaterally. Gait and Station: Arises from chair without difficulty. Stance is normal. Gait demonstrates normal stride length and balance without use of assistive device.  Reflexes: 1+ and symmetric. Toes downgoing.     NIHSS  0 Modified Rankin  0      ASSESSMENT: Ryan [name] is a 75 y.o. year old male with recent strokelike episode on 03/23/2021 possibly TIA consisting of left leg weakness in setting of known PAF on Eliquis.  Vascular risk factors include PAF CAD, CHF, rheumatic fever as a child, heart murmur, HLD and HTN.      PLAN:  TIA: No reoccurring symptoms.  Continue Pradaxa (dabigatran) twice a day for secondary stroke prevention.  Discussed secondary stroke prevention measures and importance of close PCP follow up for aggressive stroke risk factor management. I have gone over the pathophysiology of stroke, warning signs and symptoms, risk factors and their management in some detail with instructions to go to the closest emergency room for symptoms of concern. PAF: Previously on Eliquis and recently switched to Pradaxa due to recent TIA possibly in setting of known PAF despite Eliquis compliance.  Discussed continued use of Pradaxa vs going back on Eliquis - per Dr. Clydene Fake recommendations during admission, Pradaxa was recommended if affordable for stroke prevention measures.  He was advised to further discuss this with Dr. Lovena Le for follow-up visit on 8/12.  HTN: BP goal <130/90.  Stable on current regimen per PCP HLD: LDL goal <70. Recent LDL 221.  History of statin  intolerance.  Advised to follow-up with PCP to further discuss initiating Repatha or Praluent for elevated LDL levels and increased stroke risk     Follow up in 6 months or call earlier if needed   CC:  [facility] provider: Dr. Leonie Man PCP: [name], MD    I spent 49 minutes of face-to-face and non-face-to-face time with patient and wife.  This included previsit chart review including review of recent hospitalization, lab review, study review, electronic health record documentation, patient education regarding recent TIA and likely etiology, secondary stroke prevention measures and aggressive stroke risk factor management, history of PAF and use of AC and answered all other questions to patient and wife's satisfaction  Frann Rider, AGNP-BC  [facility] [address] [place], [place] [address]  Phone [phone] Fax [phone] Note: This document was prepared with digital dictation and possible smart phrase technology. Any transcriptional errors that result from this process are unintentional.

## 2021-05-05 NOTE — Patient Instructions (Signed)
Follow up with Dr. Lovena Le as scheduled on 8/12  Continue Pradaxa (dabigatran) twice a day  and atorvastatin  for secondary stroke prevention  Continue to follow up with PCP regarding cholesterol and blood pressure management  Maintain strict control of hypertension with blood pressure goal below 130/90 and cholesterol with LDL cholesterol (bad cholesterol) goal below 70 mg/dL.       Followup in the future with me in 6 months or call earlier if needed       Thank you for coming to see [place] at [facility]. I hope we have been able to provide you high quality care today.  You may receive a patient satisfaction survey over the next few weeks. We would appreciate your feedback and comments so that we may continue to improve ourselves and the health of our patients.

## 2021-05-08 NOTE — Progress Notes (Signed)
I agree with the above plan 

## 2021-05-15 ENCOUNTER — Ambulatory Visit: Payer: Medicare Other | Admitting: Family Medicine

## 2021-05-15 DIAGNOSIS — H353231 Exudative age-related macular degeneration, bilateral, with active choroidal neovascularization: Secondary | ICD-10-CM | POA: Diagnosis not present

## 2021-05-15 DIAGNOSIS — H35363 Drusen (degenerative) of macula, bilateral: Secondary | ICD-10-CM | POA: Diagnosis not present

## 2021-05-16 ENCOUNTER — Other Ambulatory Visit: Payer: Self-pay

## 2021-05-16 ENCOUNTER — Ambulatory Visit (INDEPENDENT_AMBULATORY_CARE_PROVIDER_SITE_OTHER): Payer: Medicare Other | Admitting: Internal Medicine

## 2021-05-16 ENCOUNTER — Encounter: Payer: Self-pay | Admitting: Family Medicine

## 2021-05-16 ENCOUNTER — Ambulatory Visit (INDEPENDENT_AMBULATORY_CARE_PROVIDER_SITE_OTHER): Payer: Medicare Other | Admitting: Family Medicine

## 2021-05-16 VITALS — BP 116/60 | HR 54 | Ht 64.0 in | Wt 174.0 lb

## 2021-05-16 VITALS — BP 112/62 | HR 54 | Temp 97.6°F | Resp 16 | Wt 173.0 lb

## 2021-05-16 DIAGNOSIS — I480 Paroxysmal atrial fibrillation: Secondary | ICD-10-CM | POA: Diagnosis not present

## 2021-05-16 DIAGNOSIS — E782 Mixed hyperlipidemia: Secondary | ICD-10-CM

## 2021-05-16 DIAGNOSIS — E876 Hypokalemia: Secondary | ICD-10-CM | POA: Diagnosis not present

## 2021-05-16 DIAGNOSIS — E559 Vitamin D deficiency, unspecified: Secondary | ICD-10-CM | POA: Diagnosis not present

## 2021-05-16 DIAGNOSIS — R197 Diarrhea, unspecified: Secondary | ICD-10-CM

## 2021-05-16 DIAGNOSIS — R739 Hyperglycemia, unspecified: Secondary | ICD-10-CM

## 2021-05-16 DIAGNOSIS — I10 Essential (primary) hypertension: Secondary | ICD-10-CM | POA: Diagnosis not present

## 2021-05-16 MED ORDER — DABIGATRAN ETEXILATE MESYLATE 150 MG PO CAPS: 150.0000 mg | ORAL_CAPSULE | Freq: Two times a day (BID) | ORAL | 11 refills | Status: DC

## 2021-05-16 MED ORDER — FUROSEMIDE 20 MG PO TABS: 20.0000 mg | ORAL_TABLET | Freq: Every day | ORAL | 1 refills | Status: DC | PRN

## 2021-05-16 MED ORDER — DABIGATRAN ETEXILATE MESYLATE 150 MG PO CAPS: 150.0000 mg | ORAL_CAPSULE | Freq: Two times a day (BID) | ORAL | 3 refills | Status: DC

## 2021-05-16 NOTE — Progress Notes (Signed)
HPI [name] returns today for followup. He is a pleasant 75 yo man with a  H/o HTN, PAF, arthritis and diastolic dysfunction. He has preserved LV function by recent echo. He has had minimal palpitations and wears a fit bit which has not demonstrated any sustained atrial fib. He has non-exertional chest pain. He has class 2 CHF symptoms.  He had a stroke on Eliquis and was switched to Pradaxa by Dr. Leonie Man.  Allergies  Allergen Reactions   Albumin (Human) Anaphylaxis   Polymyxin B-Trimethoprim Swelling    Eye drops made eyes swell   Pseudoephedrine Other (See Comments)    Stomach cramps   Codeine Hives, Itching and Rash   Guaiacol Other (See Comments)    Hallucinations   Statins Other (See Comments)    Muscle cramps    Gabapentin Other (See Comments)    unk   Meloxicam Other (See Comments)    Unknown reaction   Pseudoephedrine-Guaifenesin Nausea And Vomiting    Stomach cramps   Rosuvastatin Calcium Other (See Comments)    Muscle aches   Tapentadol Other (See Comments)    Unknown reaction   Ciprofloxacin Hives, Itching, Nausea Only and Rash   Moxifloxacin Nausea Only and Other (See Comments)    Headaches, stomach cramps    Rofecoxib Other (See Comments)    Stomach cramping     Current Outpatient Medications  Medication Sig Dispense Refill   amoxicillin (AMOXIL) 500 MG capsule Take 500 mg by mouth once as needed (dentist appointments).     brimonidine (ALPHAGAN) 0.2 % ophthalmic solution Place 1 drop into the left eye in the morning and at bedtime.   7   Cholecalciferol (VITAMIN D3) 5000 units CAPS Take 10,000 Units by mouth every other day.      clobetasol (TEMOVATE) 0.05 % external solution APPLY 1 APPLICATION TOPICALLY TWICE DAILY 100 mL 3   furosemide (LASIX) 20 MG tablet Take 1 tablet (20 mg total) by mouth daily as needed for edema (weight gain>3#/24 hours). 90 tablet 1   hydrocortisone 2.5 % lotion Apply 1 application topically 2 (two) times daily as needed  (dry skin).     latanoprost (XALATAN) 0.005 % ophthalmic solution Place 1 drop into the left eye at bedtime.      levalbuterol (XOPENEX HFA) 45 MCG/ACT inhaler Inhale 2 puffs into the lungs every 4 (four) hours as needed for wheezing. 45 g 2   MAGNESIUM-OXIDE 400 (240 Mg) MG tablet TAKE 1 TABLET(400 MG) BY MOUTH THREE TIMES DAILY AS NEEDED 90 tablet 1   Melatonin 3 MG CAPS Take 3 mg by mouth at bedtime.     metoprolol tartrate (LOPRESSOR) 25 MG tablet Take 1 tablet (25 mg total) by mouth 2 (two) times daily. 180 tablet 3   Multiple Vitamins-Minerals (ICAPS AREDS 2 PO) Take by mouth.     nitroGLYCERIN (NITROSTAT) 0.4 MG SL tablet Place 1 tablet (0.4 mg total) under the tongue every 5 (five) minutes as needed for chest pain. 30 tablet 0   omeprazole (PRILOSEC OTC) 20 MG tablet Take 20 mg by mouth daily.     potassium chloride (KLOR-CON) 10 MEQ tablet Take 1 tablet (10 mEq total) by mouth daily. 90 tablet 2   propafenone (RYTHMOL) 225 MG tablet TAKE 1 TABLET BY MOUTH  TWICE DAILY 180 tablet 2   Ranibizumab (LUCENTIS IO) Inject 1 Dose into the eye once as needed (Macular degeneration).     tamsulosin (FLOMAX) 0.4 MG CAPS capsule Take  0.4 mg by mouth every evening.     dabigatran (PRADAXA) 150 MG CAPS capsule Take 1 capsule (150 mg total) by mouth 2 (two) times daily. 180 capsule 3   No current facility-administered medications for this visit.     Past Medical History:  Diagnosis Date   A-fib ([place]) 03/22/2018   Anemia    CAD (coronary artery disease)    reportedly moderate CAD; managed medically   Cataract    removed from both eyes   CHF (congestive heart failure) ([place])    COPD (chronic obstructive pulmonary disease) ([place])    Eczema 08/03/2014   GERD (gastroesophageal reflux disease)    H/O: rheumatic fever    Heart murmur    High risk medication use    on amiodarone since 03/31/2012   History of colonoscopy    Hyperglycemia 06/21/2017   Hyperlipidemia    Hypertension    Kidney stone  06/21/2017   Lobar pneumonia ([place]) 03/22/2018   Low vitamin D level 06/21/2017   Macular degeneration    Osteoporosis 05/31/2016    ROS:   All systems reviewed and negative except as noted in the HPI.   Past Surgical History:  Procedure Laterality Date   APPENDECTOMY     CARDIAC CATHETERIZATION  04/22/2011   moderate left main and RCA stenosis not significant by FFR and IVUS on medical therapy   CARDIOVERSION N/A 04/25/2018   Procedure: CARDIOVERSION;  Surgeon: Skeet Latch, MD;  Location: [place];  Service: Cardiovascular;  Laterality: N/A;   CERVICAL DISCECTOMY     C5,6,7 disc fused  with plate and 5 1" screws   CHOLECYSTECTOMY     colon polyp removal  3/1.19   COLONOSCOPY     FOOT SURGERY     right calcification removed from top of foot   knee cartiledge Right    right knee   MOUTH SURGERY     periodontal surgery, bridges, splint in front,    UPPER GASTROINTESTINAL ENDOSCOPY       Family History  Problem Relation Age of Onset   Heart disease Mother    Diabetes Mother    Cirrhosis Mother    Emphysema Mother        never smoked but 2nd hand through her spouse   Hypertension Mother    Macular degeneration Mother    Heart disease Father    Cancer Father        prostate   Hyperlipidemia Father    Hypertension Father    Varicose Veins Father    Heart attack Father    Peripheral vascular disease Father    Heart disease Sister    Arthritis Sister    Hyperlipidemia Sister    Obesity Sister    Macular degeneration Sister    Heart disease Brother        5 stents   Hyperlipidemia Brother    Macular degeneration Maternal Grandfather    Cirrhosis Sister    Obesity Sister    Arthritis Sister    Heart disease Sister    Obesity Sister    Liver disease Other    Prostate cancer Other    Coronary artery disease Other    Colon cancer Neg Hx    Esophageal cancer Neg Hx    Rectal cancer Neg Hx    Stomach cancer Neg Hx      Social History   Socioeconomic  History   Marital status: Married    Spouse name: Ryan [name]   Number of children: 0  Years of education: Not on file   Highest education level: Not on file  Occupational History   Occupation: retired    Fish farm manager: RETIRED  Tobacco Use   Smoking status: Former    Packs/day: 0.50    Years: 50.00    Pack years: 25.00    Types: Cigarettes    Quit date: 05/01/2009    Years since quitting: 12.0   Smokeless tobacco: Former    Types: Nurse, children's Use: Never used  Substance and Sexual Activity   Alcohol use: Yes    Alcohol/week: 1.0 standard drink    Types: 1 Cans of beer per week    Comment: 1 beer daily   Drug use: No   Sexual activity: Yes    Comment: lives with wife, no dietary restrictions  Other Topics Concern   Not on file  Social History Narrative   Lives with wife   Social Determinants of Health   Financial Resource Strain: Not on file  Food Insecurity: Not on file  Transportation Needs: Not on file  Physical Activity: Not on file  Stress: Not on file  Social Connections: Not on file  Intimate Partner Violence: Not on file     BP 116/60   Pulse (!) 54   Ht 5\' 4"  (1.626 m)   Wt 174 lb (78.9 kg)   SpO2 97%   BMI 29.87 kg/m   Physical Exam:  Well appearing NAD HEENT: Unremarkable Neck:  No JVD, no thyromegally Lymphatics:  No adenopathy Back:  No CVA tenderness Lungs:  Clear with no wheezes HEART:  Regular rate rhythm, no murmurs, no rubs, no clicks Abd:  soft, positive bowel sounds, no organomegally, no rebound, no guarding Ext:  2 plus pulses, no edema, no cyanosis, no clubbing Skin:  No rashes no nodules Neuro:  CN II through XII intact, motor grossly intact  EKG - nsr   Assess/Plan:  1.  PAF - he is maintaining NSR. He will continue rhythmol. He will stop Eliquis and switch to Eliquis as per Dr. Leonie Man. 2. Diastolic dysfunction - he has class 2 symptoms. He is encouraged to maintain a low sodium diet. No indication for a change in his  low dose lasix. 3 HTN - his bp is up a bit today. See above. 4. Stroke - he has switched to pradaxa. I have offered him warfarin if he cannot affort Pradaxa.    Carleene Overlie Wilfrid Hyser,MD

## 2021-05-16 NOTE — Patient Instructions (Addendum)
Medication Instructions:  Your physician has recommended you make the following change in your medication:    STOP taking Eliquis  2.  START taking Pradaxa 150 mg-  Take one tablet by mouth twice a day  Labwork: None ordered.  Testing/Procedures: None ordered.  Follow-Up: Your physician wants you to follow-up in: one year with Cristopher Peru, MD or one of the following Advanced Practice Providers on your designated Care Team:   Tommye Standard, [place] Legrand Como "Jonni Sanger" Chalmers Cater, [place]  Any Other Special Instructions Will Be Listed Below (If Applicable).  If you need a refill on your cardiac medications before your next appointment, please call your pharmacy.

## 2021-05-16 NOTE — Patient Instructions (Signed)
Hypomagnesemia Hypomagnesemia is a condition in which the level of magnesium in the blood is low. Magnesium is a mineral that is found in many foods. It is used in many different processes in the body. Hypomagnesemia can affect every organ in thebody. In severe cases, it can cause life-threatening problems. What are the causes? This condition may be caused by: Not getting enough magnesium in your diet. Malnutrition. Problems with absorbing magnesium from the intestines. Dehydration. Alcohol abuse. Vomiting. Severe or chronic diarrhea. Some medicines, including medicines that make you urinate more (diuretics). Certain diseases, such as kidney disease, diabetes, celiac disease, and overactive thyroid. What are the signs or symptoms? Symptoms of this condition include: Loss of appetite. Nausea and vomiting. Involuntary shaking or trembling of a body part (tremor). Muscle weakness. Tingling in the arms and legs. Sudden tightening of muscles (muscle spasms). Confusion. Psychiatric issues, such as depression, irritability, or psychosis. A feeling of fluttering of the heart. Seizures. These symptoms are more severe if magnesium levels drop suddenly. How is this diagnosed? This condition may be diagnosed based on: Your symptoms and medical history. A physical exam. Blood and urine tests. How is this treated? Treatment depends on the cause and the severity of the condition. It may be treated with: A magnesium supplement. This can be taken in pill form. If the condition is severe, magnesium is usually given through an IV. Changes to your diet. You may be directed to eat foods that have a lot of magnesium, such as green leafy vegetables, peas, beans, and nuts. Stopping any intake of alcohol. Follow these instructions at home:     Make sure that your diet includes foods with magnesium. Foods that have a lot of magnesium in them include: Green leafy vegetables, such as spinach and  broccoli. Beans and peas. Nuts and seeds, such as almonds and sunflower seeds. Whole grains, such as whole grain bread and fortified cereals. Take magnesium supplements if your health care provider tells you to do that. Take them as directed. Take over-the-counter and prescription medicines only as told by your health care provider. Have your magnesium levels monitored as told by your health care provider. When you are active, drink fluids that contain electrolytes. Avoid drinking alcohol. Keep all follow-up visits as told by your health care provider. This is important. Contact a health care provider if: You get worse instead of better. Your symptoms return. Get help right away if you: Develop severe muscle weakness. Have trouble breathing. Feel that your heart is racing. Summary Hypomagnesemia is a condition in which the level of magnesium in the blood is low. Hypomagnesemia can affect every organ in the body. Treatment may include eating more foods that contain magnesium, taking magnesium supplements, and not drinking alcohol. Have your magnesium levels monitored as told by your health care provider. This information is not intended to replace advice given to you by your health care provider. Make sure you discuss any questions you have with your healthcare provider. Document Revised: 02/22/2020 Document Reviewed: 02/22/2020 Elsevier Patient Education  [employer].

## 2021-05-17 LAB — COMPREHENSIVE METABOLIC PANEL
AG Ratio: 1.6 (calc) (ref 1.0–2.5)
ALT: 11 U/L (ref 9–46)
AST: 14 U/L (ref 10–35)
Albumin: 4.2 g/dL (ref 3.6–5.1)
Alkaline phosphatase (APISO): 65 U/L (ref 35–144)
BUN/Creatinine Ratio: 17 (calc) (ref 6–22)
BUN: 24 mg/dL (ref 7–25)
CO2: 28 mmol/L (ref 20–32)
Calcium: 9.8 mg/dL (ref 8.6–10.3)
Chloride: 102 mmol/L (ref 98–110)
Creat: 1.42 mg/dL — ABNORMAL HIGH (ref 0.70–1.28)
Globulin: 2.7 g/dL (calc) (ref 1.9–3.7)
Glucose, Bld: 81 mg/dL (ref 65–99)
Potassium: 5.3 mmol/L (ref 3.5–5.3)
Sodium: 139 mmol/L (ref 135–146)
Total Bilirubin: 0.6 mg/dL (ref 0.2–1.2)
Total Protein: 6.9 g/dL (ref 6.1–8.1)

## 2021-05-17 LAB — MAGNESIUM: Magnesium: 1.9 mg/dL (ref 1.5–2.5)

## 2021-05-18 NOTE — Assessment & Plan Note (Signed)
Stop Furosemide and magnesium and recheck levels in one week

## 2021-05-18 NOTE — Assessment & Plan Note (Signed)
hgba1c acceptable, minimize simple carbs. Increase exercise as tolerated.  

## 2021-05-18 NOTE — Assessment & Plan Note (Signed)
Well controlled, no changes to meds. Encouraged heart healthy diet such as the DASH diet and exercise as tolerated.  °

## 2021-05-18 NOTE — Assessment & Plan Note (Signed)
Encourage heart healthy diet such as MIND or DASH diet, increase exercise, avoid trans fats, simple carbohydrates and processed foods, consider a krill or fish or flaxseed oil cap daily.  °

## 2021-05-18 NOTE — Assessment & Plan Note (Signed)
He is hoping to use Eliquis over Pradaxa his neurologist has suggested due to the cost differential. He is going to speak with his specialists to make the determination.

## 2021-05-18 NOTE — Progress Notes (Signed)
Subjective:    Patient ID: Ryan [name], male    DOB: [birth year], 75 y.o.   MRN: [id number]  Chief Complaint  Patient presents with   Follow-up    HPI Patient is in today for follow up on chronic medical concerns. No recent febrile illness or hospitalizations. He notes his diarrhea is much better but stool is still loose at times. No bloody or tarry stool. He is following with Dr Leonie Man of Neurology and [name] of cardiology. He has been encouraged to take Pradaxa by Dr Leonie Man and Eliquis by [name]. Eliquis is more cost effective for him. Denies CP/palp/SOB/HA/congestion/fevers or GU c/o. Taking meds as prescribed   Past Medical History:  Diagnosis Date   A-fib ([place]) 03/22/2018   Anemia    CAD (coronary artery disease)    reportedly moderate CAD; managed medically   Cataract    removed from both eyes   CHF (congestive heart failure) ([place])    COPD (chronic obstructive pulmonary disease) ([place])    Eczema 08/03/2014   GERD (gastroesophageal reflux disease)    H/O: rheumatic fever    Heart murmur    High risk medication use    on amiodarone since 03/31/2012   History of colonoscopy    Hyperglycemia 06/21/2017   Hyperlipidemia    Hypertension    Kidney stone 06/21/2017   Lobar pneumonia ([place]) 03/22/2018   Low vitamin D level 06/21/2017   Macular degeneration    Osteoporosis 05/31/2016    Past Surgical History:  Procedure Laterality Date   APPENDECTOMY     CARDIAC CATHETERIZATION  04/22/2011   moderate left main and RCA stenosis not significant by FFR and IVUS on medical therapy   CARDIOVERSION N/A 04/25/2018   Procedure: CARDIOVERSION;  Surgeon: Skeet Latch, MD;  Location: [facility];  Service: Cardiovascular;  Laterality: N/A;   CERVICAL DISCECTOMY     C5,6,7 disc fused  with plate and 5 1" screws   CHOLECYSTECTOMY     colon polyp removal  3/1.19   COLONOSCOPY     FOOT SURGERY     right calcification removed from top of foot   knee cartiledge Right    right knee    MOUTH SURGERY     periodontal surgery, bridges, splint in front,    UPPER GASTROINTESTINAL ENDOSCOPY      Family History  Problem Relation Age of Onset   Heart disease Mother    Diabetes Mother    Cirrhosis Mother    Emphysema Mother        never smoked but 2nd hand through her spouse   Hypertension Mother    Macular degeneration Mother    Heart disease Father    Cancer Father        prostate   Hyperlipidemia Father    Hypertension Father    Varicose Veins Father    Heart attack Father    Peripheral vascular disease Father    Heart disease Sister    Arthritis Sister    Hyperlipidemia Sister    Obesity Sister    Macular degeneration Sister    Heart disease Brother        5 stents   Hyperlipidemia Brother    Macular degeneration Maternal Grandfather    Cirrhosis Sister    Obesity Sister    Arthritis Sister    Heart disease Sister    Obesity Sister    Liver disease Other    Prostate cancer Other    Coronary artery disease Other  Colon cancer Neg Hx    Esophageal cancer Neg Hx    Rectal cancer Neg Hx    Stomach cancer Neg Hx     Social History   Socioeconomic History   Marital status: Married    Spouse name: Thayer Headings   Number of children: 0   Years of education: Not on file   Highest education level: Not on file  Occupational History   Occupation: retired    Fish farm manager: RETIRED  Tobacco Use   Smoking status: Former    Packs/day: 0.50    Years: 50.00    Pack years: 25.00    Types: Cigarettes    Quit date: 05/01/2009    Years since quitting: 12.0   Smokeless tobacco: Former    Types: Nurse, children's Use: Never used  Substance and Sexual Activity   Alcohol use: Yes    Alcohol/week: 1.0 standard drink    Types: 1 Cans of beer per week    Comment: 1 beer daily   Drug use: No   Sexual activity: Yes    Comment: lives with wife, no dietary restrictions  Other Topics Concern   Not on file  Social History Narrative   Lives with wife    Social Determinants of Health   Financial Resource Strain: Not on file  Food Insecurity: Not on file  Transportation Needs: Not on file  Physical Activity: Not on file  Stress: Not on file  Social Connections: Not on file  Intimate Partner Violence: Not on file    Outpatient Medications Prior to Visit  Medication Sig Dispense Refill   amoxicillin (AMOXIL) 500 MG capsule Take 500 mg by mouth once as needed (dentist appointments).     brimonidine (ALPHAGAN) 0.2 % ophthalmic solution Place 1 drop into the left eye in the morning and at bedtime.   7   Cholecalciferol (VITAMIN D3) 5000 units CAPS Take 10,000 Units by mouth every other day.      clobetasol (TEMOVATE) 0.05 % external solution APPLY 1 APPLICATION TOPICALLY TWICE DAILY 100 mL 3   hydrocortisone 2.5 % lotion Apply 1 application topically 2 (two) times daily as needed (dry skin).     latanoprost (XALATAN) 0.005 % ophthalmic solution Place 1 drop into the left eye at bedtime.      levalbuterol (XOPENEX HFA) 45 MCG/ACT inhaler Inhale 2 puffs into the lungs every 4 (four) hours as needed for wheezing. 45 g 2   MAGNESIUM-OXIDE 400 (240 Mg) MG tablet TAKE 1 TABLET(400 MG) BY MOUTH THREE TIMES DAILY AS NEEDED 90 tablet 1   Melatonin 3 MG CAPS Take 3 mg by mouth at bedtime.     metoprolol tartrate (LOPRESSOR) 25 MG tablet Take 1 tablet (25 mg total) by mouth 2 (two) times daily. 180 tablet 3   Multiple Vitamins-Minerals (ICAPS AREDS 2 PO) Take by mouth.     nitroGLYCERIN (NITROSTAT) 0.4 MG SL tablet Place 1 tablet (0.4 mg total) under the tongue every 5 (five) minutes as needed for chest pain. 30 tablet 0   omeprazole (PRILOSEC OTC) 20 MG tablet Take 20 mg by mouth daily.     potassium chloride (KLOR-CON) 10 MEQ tablet Take 1 tablet (10 mEq total) by mouth daily. 90 tablet 2   propafenone (RYTHMOL) 225 MG tablet TAKE 1 TABLET BY MOUTH  TWICE DAILY 180 tablet 2   Ranibizumab (LUCENTIS IO) Inject 1 Dose into the eye once as needed  (Macular degeneration).     tamsulosin (FLOMAX)  0.4 MG CAPS capsule Take 0.4 mg by mouth every evening.     dabigatran (PRADAXA) 150 MG CAPS capsule Take 1 capsule (150 mg total) by mouth 2 (two) times daily. 60 capsule 3   furosemide (LASIX) 20 MG tablet Take 1 tablet (20 mg total) by mouth daily. 90 tablet 3   No facility-administered medications prior to visit.    Allergies  Allergen Reactions   Albumin (Human) Anaphylaxis   Polymyxin B-Trimethoprim Swelling    Eye drops made eyes swell   Pseudoephedrine Other (See Comments)    Stomach cramps   Codeine Hives, Itching and Rash   Guaiacol Other (See Comments)    Hallucinations   Statins Other (See Comments)    Muscle cramps    Gabapentin Other (See Comments)    unk   Meloxicam Other (See Comments)    Unknown reaction   Pseudoephedrine-Guaifenesin Nausea And Vomiting    Stomach cramps   Rosuvastatin Calcium Other (See Comments)    Muscle aches   Tapentadol Other (See Comments)    Unknown reaction   Ciprofloxacin Hives, Itching, Nausea Only and Rash   Moxifloxacin Nausea Only and Other (See Comments)    Headaches, stomach cramps    Rofecoxib Other (See Comments)    Stomach cramping    Review of Systems  Constitutional:  Negative for fever and malaise/fatigue.  HENT:  Negative for congestion.   Eyes:  Negative for blurred vision.  Respiratory:  Negative for shortness of breath.   Cardiovascular:  Negative for chest pain, palpitations and leg swelling.  Gastrointestinal:  Positive for diarrhea. Negative for abdominal pain, blood in stool and nausea.  Genitourinary:  Negative for dysuria and frequency.  Musculoskeletal:  Negative for falls.  Skin:  Negative for rash.  Neurological:  Negative for dizziness, loss of consciousness and headaches.  Endo/Heme/Allergies:  Negative for environmental allergies.  Psychiatric/Behavioral:  Negative for depression. The patient is not nervous/anxious.       Objective:     Physical Exam Constitutional:      General: He is not in acute distress.    Appearance: Normal appearance. He is not ill-appearing or toxic-appearing.  HENT:     Head: Normocephalic and atraumatic.     Right Ear: External ear normal.     Left Ear: External ear normal.     Nose: Nose normal.  Eyes:     General:        Right eye: No discharge.        Left eye: No discharge.     Conjunctiva/sclera: Conjunctivae normal.  Cardiovascular:     Heart sounds: No murmur heard. Pulmonary:     Effort: Pulmonary effort is normal.  Abdominal:     General: Bowel sounds are normal.     Palpations: Abdomen is soft.  Skin:    Findings: No rash.  Neurological:     Mental Status: He is alert and oriented to person, place, and time.  Psychiatric:        Behavior: Behavior normal.    BP 112/62   Pulse (!) 54   Temp 97.6 F (36.4 C)   Resp 16   Wt 173 lb (78.5 kg)   SpO2 97%   BMI 29.70 kg/m  Wt Readings from Last 3 Encounters:  05/16/21 174 lb (78.9 kg)  05/16/21 173 lb (78.5 kg)  05/05/21 173 lb 6.4 oz (78.7 kg)    Diabetic Foot Exam - Simple   No data filed    Lab Results  Component Value Date   WBC 8.6 03/31/2021   HGB 13.9 03/31/2021   HCT 40.4 03/31/2021   PLT 192.0 03/31/2021   GLUCOSE 81 05/16/2021   CHOL 289 (H) 03/24/2021   TRIG 80 03/24/2021   HDL 51 03/24/2021   LDLDIRECT 134.2 12/01/2013   LDLCALC 222 (H) 03/24/2021   ALT 11 05/16/2021   AST 14 05/16/2021   NA 139 05/16/2021   K 5.3 05/16/2021   CL 102 05/16/2021   CREATININE 1.42 (H) 05/16/2021   BUN 24 05/16/2021   CO2 28 05/16/2021   TSH 2.38 03/04/2021   PSA 1.80 03/04/2021   INR 1.3 (H) 03/23/2021   HGBA1C 5.5 03/24/2021    Lab Results  Component Value Date   TSH 2.38 03/04/2021   Lab Results  Component Value Date   WBC 8.6 03/31/2021   HGB 13.9 03/31/2021   HCT 40.4 03/31/2021   MCV 90.2 03/31/2021   PLT 192.0 03/31/2021   Lab Results  Component Value Date   NA 139 05/16/2021   K  5.3 05/16/2021   CO2 28 05/16/2021   GLUCOSE 81 05/16/2021   BUN 24 05/16/2021   CREATININE 1.42 (H) 05/16/2021   BILITOT 0.6 05/16/2021   ALKPHOS 58 03/31/2021   AST 14 05/16/2021   ALT 11 05/16/2021   PROT 6.9 05/16/2021   ALBUMIN 4.1 03/31/2021   CALCIUM 9.8 05/16/2021   ANIONGAP 11 03/27/2021   GFR 53.73 (L) 03/31/2021   Lab Results  Component Value Date   CHOL 289 (H) 03/24/2021   Lab Results  Component Value Date   HDL 51 03/24/2021   Lab Results  Component Value Date   LDLCALC 222 (H) 03/24/2021   Lab Results  Component Value Date   TRIG 80 03/24/2021   Lab Results  Component Value Date   CHOLHDL 5.7 03/24/2021   Lab Results  Component Value Date   HGBA1C 5.5 03/24/2021       Assessment & Plan:   Problem List Items Addressed This Visit     Hyperlipidemia, mixed    Encourage heart healthy diet such as MIND or DASH diet, increase exercise, avoid trans fats, simple carbohydrates and processed foods, consider a krill or fish or flaxseed oil cap daily.       Relevant Medications   furosemide (LASIX) 20 MG tablet   Essential hypertension - Primary    Well controlled, no changes to meds. Encouraged heart healthy diet such as the DASH diet and exercise as tolerated.       Relevant Medications   furosemide (LASIX) 20 MG tablet   Other Relevant Orders   Comprehensive metabolic panel   Comprehensive metabolic panel (Completed)   Hyperglycemia    hgba1c acceptable, minimize simple carbs. Increase exercise as tolerated.       PAF (paroxysmal atrial fibrillation) ([place])    He is hoping to use Eliquis over Pradaxa his neurologist has suggested due to the cost differential. He is going to speak with his specialists to make the determination.       Relevant Medications   furosemide (LASIX) 20 MG tablet   Vitamin D deficiency    Supplement and monitor      Hypomagnesemia    Stop Furosemide and magnesium and recheck levels in one week      Relevant  Orders   Magnesium   Magnesium (Completed)   Hypokalemia    Stop Furosemide and potassium and recheck levels in one week      Diarrhea  Improved a great deal with Budesonide. On completely normalized but soft as opposed to watery. Continues to follow with gastroenterology       I have changed Gaudencio L. Weier "Leon"'s furosemide. I am also having him maintain his omeprazole, Ranibizumab (LUCENTIS IO), latanoprost, clobetasol, brimonidine, Vitamin D3, nitroGLYCERIN, levalbuterol, tamsulosin, potassium chloride, propafenone, Melatonin, hydrocortisone, amoxicillin, metoprolol tartrate, MAGnesium-Oxide, and Multiple Vitamins-Minerals (ICAPS AREDS 2 PO).  Meds ordered this encounter  Medications   furosemide (LASIX) 20 MG tablet    Sig: Take 1 tablet (20 mg total) by mouth daily as needed for edema (weight gain>3#/24 hours).    Dispense:  90 tablet    Refill:  1     Penni Homans, MD

## 2021-05-18 NOTE — Assessment & Plan Note (Signed)
Improved a great deal with Budesonide. On completely normalized but soft as opposed to watery. Continues to follow with gastroenterology

## 2021-05-18 NOTE — Assessment & Plan Note (Signed)
Stop Furosemide and potassium and recheck levels in one week

## 2021-05-18 NOTE — Assessment & Plan Note (Signed)
Supplement and monitor 

## 2021-05-21 ENCOUNTER — Telehealth: Payer: Self-pay

## 2021-05-21 MED ORDER — DABIGATRAN ETEXILATE MESYLATE 150 MG PO CAPS
150.0000 mg | ORAL_CAPSULE | Freq: Two times a day (BID) | ORAL | 3 refills | Status: DC
Start: 2021-05-21 — End: 2021-10-14

## 2021-05-21 NOTE — Telephone Encounter (Signed)
Resent Pt's prescription for Pradaxa to [facility] advising Eliquis had been discontinued.

## 2021-05-23 ENCOUNTER — Other Ambulatory Visit: Payer: Self-pay

## 2021-05-23 ENCOUNTER — Other Ambulatory Visit (INDEPENDENT_AMBULATORY_CARE_PROVIDER_SITE_OTHER): Payer: Medicare Other

## 2021-05-23 DIAGNOSIS — I10 Essential (primary) hypertension: Secondary | ICD-10-CM

## 2021-05-23 LAB — COMPREHENSIVE METABOLIC PANEL
ALT: 11 U/L (ref 0–53)
AST: 15 U/L (ref 0–37)
Albumin: 3.9 g/dL (ref 3.5–5.2)
Alkaline Phosphatase: 62 U/L (ref 39–117)
BUN: 23 mg/dL (ref 6–23)
CO2: 26 mEq/L (ref 19–32)
Calcium: 9.4 mg/dL (ref 8.4–10.5)
Chloride: 102 mEq/L (ref 96–112)
Creatinine, Ser: 1.52 mg/dL — ABNORMAL HIGH (ref 0.40–1.50)
GFR: 44.91 mL/min — ABNORMAL LOW (ref >60.00)
Glucose, Bld: 88 mg/dL (ref 70–99)
Potassium: 4.4 mEq/L (ref 3.5–5.1)
Sodium: 139 mEq/L (ref 135–145)
Total Bilirubin: 0.6 mg/dL (ref 0.2–1.2)
Total Protein: 6.5 g/dL (ref 6.0–8.3)

## 2021-05-23 LAB — MAGNESIUM: Magnesium: 1.6 mg/dL (ref 1.5–2.5)

## 2021-05-25 ENCOUNTER — Encounter: Payer: Self-pay | Admitting: Family Medicine

## 2021-05-25 DIAGNOSIS — R790 Abnormal level of blood mineral: Secondary | ICD-10-CM

## 2021-05-26 MED ORDER — MAGNESIUM OXIDE -MG SUPPLEMENT 400 (240 MG) MG PO TABS: ORAL_TABLET | ORAL | 1 refills | Status: DC

## 2021-05-26 NOTE — Telephone Encounter (Signed)
Patient called and he would like someone to call him back to answer his questions about the medicine. [phone]

## 2021-05-28 NOTE — Telephone Encounter (Signed)
LVM to call back.

## 2021-05-28 NOTE — Telephone Encounter (Signed)
Pt aware of medication change 

## 2021-06-07 ENCOUNTER — Other Ambulatory Visit: Payer: Self-pay | Admitting: Internal Medicine

## 2021-06-08 ENCOUNTER — Other Ambulatory Visit: Payer: Self-pay | Admitting: Internal Medicine

## 2021-06-12 DIAGNOSIS — H35372 Puckering of macula, left eye: Secondary | ICD-10-CM | POA: Diagnosis not present

## 2021-06-12 DIAGNOSIS — Z9841 Cataract extraction status, right eye: Secondary | ICD-10-CM | POA: Diagnosis not present

## 2021-06-12 DIAGNOSIS — H35363 Drusen (degenerative) of macula, bilateral: Secondary | ICD-10-CM | POA: Diagnosis not present

## 2021-06-12 DIAGNOSIS — H26493 Other secondary cataract, bilateral: Secondary | ICD-10-CM | POA: Diagnosis not present

## 2021-06-12 DIAGNOSIS — H353231 Exudative age-related macular degeneration, bilateral, with active choroidal neovascularization: Secondary | ICD-10-CM | POA: Diagnosis not present

## 2021-06-12 DIAGNOSIS — H3589 Other specified retinal disorders: Secondary | ICD-10-CM | POA: Diagnosis not present

## 2021-06-12 DIAGNOSIS — Z9842 Cataract extraction status, left eye: Secondary | ICD-10-CM | POA: Diagnosis not present

## 2021-06-17 ENCOUNTER — Telehealth: Payer: Self-pay | Admitting: Gastroenterology

## 2021-06-17 NOTE — Telephone Encounter (Signed)
Inbound call from pt requesting a call back stating that his medication Prolisec isn't working. He is experiencing diarrhea. Please advise. Thank you

## 2021-06-18 NOTE — Telephone Encounter (Signed)
Lm on vm for patient to return call 

## 2021-06-18 NOTE — Telephone Encounter (Signed)
Spoke with patient, he states that he is not sure about the message that I received below but the Prilosec is working fine. Pt states that he has concerns about Budesonide. Pt states that he finished his prescription last week and the diarrhea returned about 3 days ago. Pt states that his stools had improved on the Budesonide. Patient reports that he has been having about 2 watery episodes of diarrhea for the past 3 days. Pt denies any other symptoms like rectal bleeding, pain, fever, or chills. Pt would like to know if he should continue the Budesonide for longer. Pt states that he will be leaving this weekend for a 2-week vacation and would really like to have this prescription ([facility] in [place]). Pt is aware that Dr. Bryan Lemma is out of the office today but will return tomorrow. Pt is aware that we will call him back with recommendations. Please advise, thanks.

## 2021-06-19 DIAGNOSIS — Z23 Encounter for immunization: Secondary | ICD-10-CM | POA: Diagnosis not present

## 2021-06-19 MED ORDER — BUDESONIDE 3 MG PO CPEP: ORAL_CAPSULE | ORAL | 0 refills | Status: DC

## 2021-06-19 NOTE — Telephone Encounter (Signed)
Spoke with patient in regards to recommendations. Pt is aware that new RX has been sent to [facility] in [place] as requested. Pt verbalized understanding of all information and had no concerns at the end of the call.

## 2021-06-19 NOTE — Telephone Encounter (Signed)
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  He should have been at 3 mg/day, and if this was controlling his symptoms, we can bump back to 6 mg/day x6 weeks to recapture control, then carry out the 3 mg/day dosing for 2 additional months for a long taper, then wean back off again. Ok to use loperamide or Imodium as needed while we recapture control with the budesonide.

## 2021-07-01 ENCOUNTER — Other Ambulatory Visit: Payer: Self-pay | Admitting: Urology

## 2021-07-01 ENCOUNTER — Telehealth: Payer: Self-pay | Admitting: Internal Medicine

## 2021-07-01 NOTE — Telephone Encounter (Signed)
Patient with diagnosis of afib on Pradaxa for anticoagulation.    Procedure:  TURP Date of procedure: 07/17/21  CHA2DS2-VASc Score = 6   This indicates a 9.7% annual risk of stroke. The patient's score is based upon: CHF History: 1 HTN History: 1 Diabetes History: 0 Stroke History: 2 Vascular Disease History: 1 Age Score: 1 Gender Score: 0     CrCl 40.45  Patient had a TIA on 03/23/21  Will need approval from Dr. Lovena Le to hold Pradaxa due to risk off anticoagulation. MD is requesting 5 days. Our policy would recommend 4 days.

## 2021-07-01 NOTE — Telephone Encounter (Signed)
     [place] Pre-operative Risk Assessment    Patient Name: SEANN GENTHER  DOB: [birth year] MRN: [id number]  HEARTCARE STAFF:  - IMPORTANT!!!!!! Under Visit Info/Reason for Call, type in Other and utilize the format Clearance MM/DD/YY or Clearance TBD. Do not use dashes or single digits. - Please review there is not already an duplicate clearance open for this procedure. - If request is for dental extraction, please clarify the # of teeth to be extracted. - If the patient is currently at the dentist's office, call Pre-Op Callback Staff (MA/nurse) to input urgent request.  - If the patient is not currently in the dentist office, please route to the Pre-Op pool.  Request for surgical clearance:  What type of surgery is being performed? TURP  When is this surgery scheduled? 07/17/21  What type of clearance is required (medical clearance vs. Pharmacy clearance to hold med vs. Both)? Both  Are there any medications that need to be held prior to surgery and how long? Pradaxa held 5 days prior   Practice name and name of physician performing surgery? Dr. [name] - [facility]  What is the office phone number? [phone] ext [phone]   7.   What is the office fax number? [phone]  8.   Anesthesia type (None, local, MAC, general) ? General   Angeline S Hammer 07/01/2021, 10:46 AM  _________________________________________________________________   (provider comments below)

## 2021-07-05 DIAGNOSIS — C61 Malignant neoplasm of prostate: Secondary | ICD-10-CM

## 2021-07-05 DIAGNOSIS — K9189 Other postprocedural complications and disorders of digestive system: Secondary | ICD-10-CM

## 2021-07-05 DIAGNOSIS — K567 Ileus, unspecified: Secondary | ICD-10-CM

## 2021-07-05 HISTORY — DX: Malignant neoplasm of prostate: C61

## 2021-07-05 HISTORY — DX: Ileus, unspecified: K56.7

## 2021-07-05 HISTORY — DX: Ileus, unspecified: K91.89

## 2021-07-07 DIAGNOSIS — R3916 Straining to void: Secondary | ICD-10-CM | POA: Diagnosis not present

## 2021-07-07 DIAGNOSIS — N401 Enlarged prostate with lower urinary tract symptoms: Secondary | ICD-10-CM | POA: Diagnosis not present

## 2021-07-07 DIAGNOSIS — R351 Nocturia: Secondary | ICD-10-CM | POA: Diagnosis not present

## 2021-07-08 DIAGNOSIS — H6123 Impacted cerumen, bilateral: Secondary | ICD-10-CM | POA: Diagnosis not present

## 2021-07-08 NOTE — Telephone Encounter (Signed)
Dr. Lovena Le please advise/provide recommendations for holding Pradaxa prior to TURP procedure.    Thank you,  Hayward Rylander. Keondre Markson NP-C    07/08/2021, 9:23 AM [address] Office [phone] Fax [phone]

## 2021-07-09 NOTE — Telephone Encounter (Signed)
Ok to hold Pradaxa for 2 days prior to TURP. Restart Pradaxa after the surgery when bleeding risk is acceptable. GT

## 2021-07-10 DIAGNOSIS — H353211 Exudative age-related macular degeneration, right eye, with active choroidal neovascularization: Secondary | ICD-10-CM | POA: Diagnosis not present

## 2021-07-10 DIAGNOSIS — H3589 Other specified retinal disorders: Secondary | ICD-10-CM | POA: Diagnosis not present

## 2021-07-10 NOTE — Telephone Encounter (Signed)
Will route to callback team to let them know Dr. Lovena Le is recommending a 2 day hold for Pradaxa. Please find out if that will be sufficient. (The request said 5 day hold but unclear if perhaps they were thinking of Plavix.) If they are OK with a 2 day hold, I will call patient and finalize clearance. But if they require an absolute 5 day hold we will need to take it back to Dr. Lovena Le again. Please route response to preop box when known. Thanks.

## 2021-07-10 NOTE — Telephone Encounter (Signed)
Left message for a call back to discuss further about hold time for Pradaxa for upcoming procedure. Left message to call back and ask to s/w a member of the pre op team. I will fax these notes as well to requesting office to please call back in regards to hold time for Pradaxa. See notes from Melina Copa, [facility].

## 2021-07-10 NOTE — Telephone Encounter (Signed)
Selita from Dr. Erma Heritage office called back, stating she will d/w MD recommendations from cardiology per Pradaxa hold time. Selita stated she will let our office know once she d/w Dr.Dahlstedt.

## 2021-07-11 ENCOUNTER — Encounter (HOSPITAL_BASED_OUTPATIENT_CLINIC_OR_DEPARTMENT_OTHER): Payer: Self-pay | Admitting: Urology

## 2021-07-11 NOTE — Telephone Encounter (Signed)
   Name: Ryan [name]  DOB: 10-28-1945  MRN: [id number]   Primary Cardiologist: Cristopher Peru, MD  Chart reviewed as part of pre-operative protocol coverage. Patient was contacted 07/11/2021 in reference to pre-operative risk assessment for pending surgery as outlined below.  EINER MEALS was last seen on 05/16/2021 by Dr. Lovena Le.  Since that day, HARDING THOMURE has done well without chest pain or worsening dyspnea.  Therefore, based on ACC/AHA guidelines, the patient would be at acceptable risk for the planned procedure without further cardiovascular testing.   See Dr. Tanna Furry recommendation regarding Pradaxa holding time.  The patient was advised that if he develops new symptoms prior to surgery to contact our office to arrange for a follow-up visit, and he verbalized understanding.  I will route this recommendation to the requesting party via Epic fax function and remove from pre-op pool. Please call with questions.  [place], [place] 07/11/2021, 4:39 PM

## 2021-07-11 NOTE — Telephone Encounter (Signed)
I will forward this new note to Dr. Diona Fanti with Dr. Tanna Furry recommendations. Will await for input from Dr. Diona Fanti whether this is acceptable to him as well for upcoming surgery for the pt.

## 2021-07-11 NOTE — Telephone Encounter (Signed)
I s/w Selita with Dr. Alan Ripper office. I confirmed with her notes sent this morning with Dr. Lovena Le would prefer 2 day hold for Pradaxa, though is ok to no more than 3 day hold for the Pradaxa. Selita stated she will call the pt and thanked our office for all of the help we have given in this matter.   See notes from Almyra Deforest, [facility] 07/11/21 @ 8:06 am with note from Dr. Lovena Le recommendations. I will fax notes and final clearance to surgeon's office.

## 2021-07-11 NOTE — Telephone Encounter (Signed)
Dr. Lovena Le reiterated his recommendation of holding pradaxa for 2 days, 3 days at most, but really should be 2 day hold prior to urology surgery. Awaiting response from urology service

## 2021-07-14 ENCOUNTER — Other Ambulatory Visit: Payer: Self-pay | Admitting: Urology

## 2021-07-14 LAB — SARS CORONAVIRUS 2 (TAT 6-24 HRS): SARS Coronavirus 2: NEGATIVE

## 2021-07-15 ENCOUNTER — Encounter (HOSPITAL_BASED_OUTPATIENT_CLINIC_OR_DEPARTMENT_OTHER): Payer: Self-pay | Admitting: Urology

## 2021-07-15 ENCOUNTER — Other Ambulatory Visit: Payer: Self-pay

## 2021-07-15 NOTE — Progress Notes (Addendum)
ADDENDUM:  Reviewed pt chart w/ anesthesia via phone, Dr Smith Robert MDA, stated ok to proceed.   Spoke w/ via phone for pre-op interview--- pt Lab needs dos----  [employer] results------ current ekg in epic/ chart COVID test -----patient states asymptomatic;  covid test done 07-14-2021 negative result in epic Arrive at ------- 0530 on 07-17-2021 NPO after MN NO Solid Food.  Clear liquids from MN until---  0430 Med rec completed Medications to take morning of surgery ----- rythmol, lopressor, entocort, prilosec, eye drop as usual  Diabetic medication -----n/a Patient instructed no nail polish to be worn day of surgery Patient instructed to bring photo id and insurance card day of surgery Patient aware to have Driver (ride ) / caregiver  for 24 hours after surgery -- wife, Ryan [name] Patient Special Instructions ----- reviewed [facility] and visitor guidelines, asked to bring rescue inhaler and eye drop Pre-Op special Istructions ----- pt has cardiac telephone clearance dated 07-11-2021 by Almyra Deforest PA/ Dr Darnell Level. Lovena Le in epic/ chart Patient verbalized understanding of instructions that were given at this phone interview. Patient denies shortness of breath, chest pain, fever, cough at this phone interview.   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 Stated last taken one nitro approx 6 months had rapid heart rate and hyperventilating , hospital admission dx hypomagnesemia 03/ 2022, no issue since. Pt stated uses rescue inhaler one puff nightly for prevention wheezing. Pt stated last pradaxa dose Saturday evening  07-12-2021, stated this was instructions given from dr dahlstedt's office.  PCP: Dr Chauncey Cruel. Ryan [name] 05-16-2021 epic) Cardiologist : Dr Darnell Level. Lovena Le [facility] 05-16-2021 epic) Neurologist:  Dr Leonie Man [facility] 05-05-2021 epic GI:  Dr Bryan Lemma Chest x-ray : 03-27-2021 epic EKG : 05-16-2021 epic Echo : 03-25-2021 epic Stress test:  nuclear 02-18-2011 epic Cardiac Cath : 04-22-2011 epic Activity level: see above Sleep Study/ CPAP : no Blood Thinner/ Instructions Maryjane Hurter Dose: Pradaxa ASA / Instructions/ Last Dose :  no

## 2021-07-16 NOTE — Anesthesia Preprocedure Evaluation (Addendum)
Anesthesia Evaluation  Patient identified by MRN, date of birth, ID band Patient awake    Reviewed: Allergy & Precautions, NPO status , Patient's Chart, lab work & pertinent test results, reviewed documented beta blocker date and time   Airway Mallampati: I  TM Distance: >3 FB Neck ROM: Full    Dental  (+) Partial Lower, Dental Advisory Given   Pulmonary shortness of breath and with exertion, sleep apnea , COPD,  COPD inhaler, former smoker,    Pulmonary exam normal breath sounds clear to auscultation       Cardiovascular Exercise Tolerance: Good hypertension, Pt. on medications and Pt. on home beta blockers + CAD, + Peripheral Vascular Disease, +CHF and + DOE  + Valvular Problems/Murmurs  Rhythm:Irregular Rate:Normal  Moderate stenosis left Main  EKG 05/16/21 SB, non specific ST-T wave abormalities  Echo 03/25/21 1. Left ventricular ejection fraction, by estimation, is 50 to 55%. The left ventricle has low normal function. The left ventricle has no regional wall motion abnormalities. Left ventricular diastolic parameters are indeterminate.  2. Right ventricular systolic function is normal. The right ventricular size is normal.  3. The mitral valve is normal in structure. Trivial mitral valve regurgitation. No evidence of mitral stenosis.  4. The aortic valve is tricuspid. Aortic valve regurgitation is not visualized. Mild aortic valve sclerosis is present, with no evidence of aortic valve stenosis.  5. The inferior vena cava is normal in size with greater than 50% respiratory variability, suggesting right atrial pressure of 3 mmHg.    Neuro/Psych PSYCHIATRIC DISORDERS Anxiety Macular degeneration OU Glaucoma OS Hx/o TIA TIA Neuromuscular disease    GI/Hepatic Neg liver ROS, GERD  Medicated,  Endo/Other  Hyperlipidemia  Renal/GU Renal InsufficiencyRenal diseaseHx/o renal calculi   BPH    Musculoskeletal negative  musculoskeletal ROS (+)   Abdominal   Peds  Hematology  (+) anemia , Praxada therapy-last dose 10/8   Anesthesia Other Findings   Reproductive/Obstetrics                          Anesthesia Physical Anesthesia Plan  ASA: 3  Anesthesia Plan: General   Post-op Pain Management:    Induction: Intravenous  PONV Risk Score and Plan: 3 and Treatment may vary due to age or medical condition, Ondansetron and Dexamethasone  Airway Management Planned: LMA  Additional Equipment:   Intra-op Plan:   Post-operative Plan: Extubation in OR  Informed Consent: I have reviewed the patients History and Physical, chart, labs and discussed the procedure including the risks, benefits and alternatives for the proposed anesthesia with the patient or authorized representative who has indicated his/her understanding and acceptance.     Dental advisory given  Plan Discussed with: CRNA and Anesthesiologist  Anesthesia Plan Comments:        Anesthesia Quick Evaluation

## 2021-07-17 ENCOUNTER — Encounter (HOSPITAL_BASED_OUTPATIENT_CLINIC_OR_DEPARTMENT_OTHER): Payer: Self-pay | Admitting: Urology

## 2021-07-17 ENCOUNTER — Observation Stay (HOSPITAL_BASED_OUTPATIENT_CLINIC_OR_DEPARTMENT_OTHER)
Admission: RE | Admit: 2021-07-17 | Discharge: 2021-07-18 | Disposition: A | Discharge status: Home / Self Care | Payer: Medicare Other | Source: Ambulatory Visit | Attending: Urology | Admitting: Urology

## 2021-07-17 ENCOUNTER — Other Ambulatory Visit: Payer: Self-pay

## 2021-07-17 ENCOUNTER — Encounter (HOSPITAL_BASED_OUTPATIENT_CLINIC_OR_DEPARTMENT_OTHER)
Admission: RE | Disposition: A | Discharge status: Home / Self Care | Payer: Self-pay | Source: Ambulatory Visit | Attending: Urology

## 2021-07-17 ENCOUNTER — Ambulatory Visit (HOSPITAL_BASED_OUTPATIENT_CLINIC_OR_DEPARTMENT_OTHER): Payer: Medicare Other | Admitting: Anesthesiology

## 2021-07-17 DIAGNOSIS — I5032 Chronic diastolic (congestive) heart failure: Secondary | ICD-10-CM | POA: Diagnosis not present

## 2021-07-17 DIAGNOSIS — C61 Malignant neoplasm of prostate: Secondary | ICD-10-CM | POA: Diagnosis not present

## 2021-07-17 DIAGNOSIS — J449 Chronic obstructive pulmonary disease, unspecified: Secondary | ICD-10-CM | POA: Diagnosis not present

## 2021-07-17 DIAGNOSIS — N179 Acute kidney failure, unspecified: Secondary | ICD-10-CM | POA: Diagnosis not present

## 2021-07-17 DIAGNOSIS — K567 Ileus, unspecified: Secondary | ICD-10-CM | POA: Diagnosis not present

## 2021-07-17 DIAGNOSIS — N401 Enlarged prostate with lower urinary tract symptoms: Secondary | ICD-10-CM | POA: Diagnosis not present

## 2021-07-17 DIAGNOSIS — E876 Hypokalemia: Secondary | ICD-10-CM | POA: Diagnosis not present

## 2021-07-17 DIAGNOSIS — I420 Dilated cardiomyopathy: Secondary | ICD-10-CM | POA: Diagnosis not present

## 2021-07-17 DIAGNOSIS — E669 Obesity, unspecified: Secondary | ICD-10-CM | POA: Diagnosis not present

## 2021-07-17 DIAGNOSIS — R338 Other retention of urine: Secondary | ICD-10-CM | POA: Diagnosis not present

## 2021-07-17 DIAGNOSIS — I739 Peripheral vascular disease, unspecified: Secondary | ICD-10-CM | POA: Diagnosis not present

## 2021-07-17 DIAGNOSIS — N138 Other obstructive and reflux uropathy: Secondary | ICD-10-CM | POA: Diagnosis present

## 2021-07-17 DIAGNOSIS — I130 Hypertensive heart and chronic kidney disease with heart failure and stage 1 through stage 4 chronic kidney disease, or unspecified chronic kidney disease: Secondary | ICD-10-CM | POA: Diagnosis not present

## 2021-07-17 DIAGNOSIS — J439 Emphysema, unspecified: Secondary | ICD-10-CM | POA: Diagnosis not present

## 2021-07-17 DIAGNOSIS — Z20822 Contact with and (suspected) exposure to covid-19: Secondary | ICD-10-CM | POA: Diagnosis not present

## 2021-07-17 DIAGNOSIS — Z6830 Body mass index (BMI) 30.0-30.9, adult: Secondary | ICD-10-CM | POA: Diagnosis not present

## 2021-07-17 DIAGNOSIS — N1831 Chronic kidney disease, stage 3a: Secondary | ICD-10-CM | POA: Diagnosis not present

## 2021-07-17 DIAGNOSIS — I25110 Atherosclerotic heart disease of native coronary artery with unstable angina pectoris: Secondary | ICD-10-CM | POA: Diagnosis not present

## 2021-07-17 HISTORY — DX: Personal history of other diseases of the circulatory system: Z86.79

## 2021-07-17 HISTORY — DX: Radiculopathy, lumbar region: M54.16

## 2021-07-17 HISTORY — DX: Benign prostatic hyperplasia with lower urinary tract symptoms: N13.8

## 2021-07-17 HISTORY — DX: Personal history of adenomatous and serrated colon polyps: Z86.0101

## 2021-07-17 HISTORY — PX: TRANSURETHRAL RESECTION OF PROSTATE: SHX73

## 2021-07-17 HISTORY — DX: Unspecified hemorrhoids: K64.9

## 2021-07-17 HISTORY — DX: Other forms of dyspnea: R06.09

## 2021-07-17 HISTORY — DX: Unspecified macular degeneration: H35.30

## 2021-07-17 HISTORY — DX: Unspecified glaucoma: H40.9

## 2021-07-17 HISTORY — DX: Other obstructive and reflux uropathy: N13.8

## 2021-07-17 HISTORY — DX: Vitamin D deficiency, unspecified: E55.9

## 2021-07-17 HISTORY — DX: Lymphocytic colitis: K52.832

## 2021-07-17 HISTORY — DX: Long term (current) use of anticoagulants: Z79.01

## 2021-07-17 HISTORY — DX: Urethral disorder, unspecified: N36.9

## 2021-07-17 HISTORY — DX: Other obstructive and reflux uropathy: N40.1

## 2021-07-17 HISTORY — DX: Personal history of urinary calculi: Z87.442

## 2021-07-17 HISTORY — DX: Personal history of other specified conditions: Z87.898

## 2021-07-17 HISTORY — DX: Chronic kidney disease, stage 2 (mild): N18.2

## 2021-07-17 HISTORY — DX: Personal history of colonic polyps: Z86.010

## 2021-07-17 LAB — POCT I-STAT, CHEM 8
BUN: 22 mg/dL (ref 8–23)
Calcium, Ion: 1.23 mmol/L (ref 1.15–1.40)
Chloride: 104 mmol/L (ref 98–111)
Creatinine, Ser: 1.3 mg/dL — ABNORMAL HIGH (ref 0.61–1.24)
Glucose, Bld: 93 mg/dL (ref 70–99)
HCT: 38 % — ABNORMAL LOW (ref 39.0–52.0)
Hemoglobin: 12.9 g/dL — ABNORMAL LOW (ref 13.0–17.0)
Potassium: 4.5 mmol/L (ref 3.5–5.1)
Sodium: 139 mmol/L (ref 135–145)
TCO2: 24 mmol/L (ref 22–32)

## 2021-07-17 SURGERY — TURP (TRANSURETHRAL RESECTION OF PROSTATE)
Anesthesia: General | Site: Prostate

## 2021-07-17 MED ORDER — DEXAMETHASONE SODIUM PHOSPHATE 10 MG/ML IJ SOLN
INTRAMUSCULAR | Status: DC | PRN
  Administered 2021-07-17: 5 mg via INTRAVENOUS

## 2021-07-17 MED ORDER — PANTOPRAZOLE SODIUM 40 MG PO TBEC
40.0000 mg | DELAYED_RELEASE_TABLET | Freq: Every day | ORAL | Status: DC
  Administered 2021-07-17: 40 mg via ORAL

## 2021-07-17 MED ORDER — ACETAMINOPHEN 325 MG PO TABS
ORAL_TABLET | ORAL | Status: AC
  Filled 2021-07-17: qty 2

## 2021-07-17 MED ORDER — EPHEDRINE SULFATE-NACL 50-0.9 MG/10ML-% IV SOSY
PREFILLED_SYRINGE | INTRAVENOUS | Status: DC | PRN
  Administered 2021-07-17: 10 mg via INTRAVENOUS

## 2021-07-17 MED ORDER — CEFAZOLIN SODIUM-DEXTROSE 2-4 GM/100ML-% IV SOLN
INTRAVENOUS | Status: AC
  Filled 2021-07-17: qty 100

## 2021-07-17 MED ORDER — FENTANYL CITRATE (PF) 100 MCG/2ML IJ SOLN
INTRAMUSCULAR | Status: DC | PRN
  Administered 2021-07-17 (×2): 50 ug via INTRAVENOUS

## 2021-07-17 MED ORDER — AMOXICILLIN 500 MG PO CAPS
500.0000 mg | ORAL_CAPSULE | Freq: Two times a day (BID) | ORAL | Status: DC
  Administered 2021-07-17 – 2021-07-18 (×3): 500 mg via ORAL
  Filled 2021-07-17: qty 1

## 2021-07-17 MED ORDER — PROPOFOL 10 MG/ML IV BOLUS
INTRAVENOUS | Status: DC | PRN
  Administered 2021-07-17: 110 mg via INTRAVENOUS

## 2021-07-17 MED ORDER — TAMSULOSIN HCL 0.4 MG PO CAPS: 0.4000 mg | ORAL_CAPSULE | Freq: Two times a day (BID) | ORAL | Status: DC

## 2021-07-17 MED ORDER — LACTATED RINGERS IV SOLN
INTRAVENOUS | Status: DC
  Administered 2021-07-17: 1000 mL via INTRAVENOUS

## 2021-07-17 MED ORDER — LIDOCAINE 2% (20 MG/ML) 5 ML SYRINGE
INTRAMUSCULAR | Status: AC
  Filled 2021-07-17: qty 5

## 2021-07-17 MED ORDER — SODIUM CHLORIDE 0.45 % IV SOLN: INTRAVENOUS | Status: DC

## 2021-07-17 MED ORDER — ONDANSETRON HCL 4 MG/2ML IJ SOLN: 4.0000 mg | Freq: Once | INTRAMUSCULAR | Status: DC | PRN

## 2021-07-17 MED ORDER — CEFAZOLIN SODIUM-DEXTROSE 2-4 GM/100ML-% IV SOLN
2.0000 g | INTRAVENOUS | Status: AC
  Administered 2021-07-17: 2 g via INTRAVENOUS

## 2021-07-17 MED ORDER — OXYCODONE HCL 5 MG PO TABS
ORAL_TABLET | ORAL | Status: AC
  Filled 2021-07-17: qty 1

## 2021-07-17 MED ORDER — SENNA 8.6 MG PO TABS: 1.0000 | ORAL_TABLET | Freq: Two times a day (BID) | ORAL | Status: DC

## 2021-07-17 MED ORDER — ONDANSETRON HCL 4 MG/2ML IJ SOLN
INTRAMUSCULAR | Status: DC | PRN
  Administered 2021-07-17: 4 mg via INTRAVENOUS

## 2021-07-17 MED ORDER — ONDANSETRON HCL 4 MG/2ML IJ SOLN
INTRAMUSCULAR | Status: AC
  Filled 2021-07-17: qty 2

## 2021-07-17 MED ORDER — ZOLPIDEM TARTRATE 5 MG PO TABS: 5.0000 mg | ORAL_TABLET | Freq: Every evening | ORAL | Status: DC | PRN

## 2021-07-17 MED ORDER — BACITRACIN-NEOMYCIN-POLYMYXIN 400-5-5000 EX OINT: 1.0000 "application " | TOPICAL_OINTMENT | Freq: Three times a day (TID) | CUTANEOUS | Status: DC | PRN

## 2021-07-17 MED ORDER — DEXAMETHASONE SODIUM PHOSPHATE 10 MG/ML IJ SOLN
INTRAMUSCULAR | Status: AC
  Filled 2021-07-17: qty 2

## 2021-07-17 MED ORDER — METOPROLOL TARTRATE 25 MG PO TABS
ORAL_TABLET | ORAL | Status: AC
  Filled 2021-07-17: qty 1

## 2021-07-17 MED ORDER — BRIMONIDINE TARTRATE 0.2 % OP SOLN: 1.0000 [drp] | Freq: Every day | OPHTHALMIC | Status: DC

## 2021-07-17 MED ORDER — LEVALBUTEROL TARTRATE 45 MCG/ACT IN AERO: 2.0000 | INHALATION_SPRAY | RESPIRATORY_TRACT | Status: DC | PRN

## 2021-07-17 MED ORDER — MENTHOL 3 MG MT LOZG: 1.0000 | LOZENGE | OROMUCOSAL | Status: DC | PRN

## 2021-07-17 MED ORDER — FENTANYL CITRATE (PF) 100 MCG/2ML IJ SOLN
INTRAMUSCULAR | Status: AC
  Filled 2021-07-17: qty 2

## 2021-07-17 MED ORDER — SODIUM CHLORIDE 0.9 % IR SOLN
3000.0000 mL | Status: DC
  Administered 2021-07-17 (×7): 3000 mL

## 2021-07-17 MED ORDER — NITROGLYCERIN 0.4 MG SL SUBL: 0.4000 mg | SUBLINGUAL_TABLET | SUBLINGUAL | Status: DC | PRN

## 2021-07-17 MED ORDER — PROPAFENONE HCL 225 MG PO TABS
225.0000 mg | ORAL_TABLET | Freq: Two times a day (BID) | ORAL | Status: DC
  Administered 2021-07-17: 225 mg via ORAL
  Filled 2021-07-17: qty 1

## 2021-07-17 MED ORDER — GLYCOPYRROLATE PF 0.2 MG/ML IJ SOSY
PREFILLED_SYRINGE | INTRAMUSCULAR | Status: AC
  Filled 2021-07-17: qty 1

## 2021-07-17 MED ORDER — BELLADONNA ALKALOIDS-OPIUM 16.2-60 MG RE SUPP
1.0000 | Freq: Four times a day (QID) | RECTAL | Status: DC | PRN
Start: 2021-07-17 — End: 2021-07-18
  Administered 2021-07-17: 1 via RECTAL

## 2021-07-17 MED ORDER — LIDOCAINE 2% (20 MG/ML) 5 ML SYRINGE
INTRAMUSCULAR | Status: DC | PRN
  Administered 2021-07-17: 60 mg via INTRAVENOUS

## 2021-07-17 MED ORDER — METOPROLOL TARTRATE 25 MG PO TABS
25.0000 mg | ORAL_TABLET | Freq: Two times a day (BID) | ORAL | Status: DC
  Administered 2021-07-17: 25 mg via ORAL

## 2021-07-17 MED ORDER — POTASSIUM CHLORIDE CRYS ER 10 MEQ PO TBCR
10.0000 meq | EXTENDED_RELEASE_TABLET | Freq: Every day | ORAL | Status: DC
  Administered 2021-07-17: 10 meq via ORAL
  Filled 2021-07-17: qty 1

## 2021-07-17 MED ORDER — LATANOPROST 0.005 % OP SOLN: 1.0000 [drp] | Freq: Every day | OPHTHALMIC | Status: DC

## 2021-07-17 MED ORDER — BACITRACIN-NEOMYCIN-POLYMYXIN OINTMENT TUBE
TOPICAL_OINTMENT | CUTANEOUS | Status: AC
  Filled 2021-07-17: qty 14.17

## 2021-07-17 MED ORDER — OXYCODONE HCL 5 MG/5ML PO SOLN: 5.0000 mg | Freq: Once | ORAL | Status: DC | PRN

## 2021-07-17 MED ORDER — HYDROMORPHONE HCL 1 MG/ML IJ SOLN
INTRAMUSCULAR | Status: AC
  Filled 2021-07-17: qty 1

## 2021-07-17 MED ORDER — OXYCODONE HCL 5 MG PO TABS
5.0000 mg | ORAL_TABLET | Freq: Four times a day (QID) | ORAL | Status: DC | PRN
  Administered 2021-07-17: 5 mg via ORAL

## 2021-07-17 MED ORDER — ACETAMINOPHEN 325 MG PO TABS
650.0000 mg | ORAL_TABLET | ORAL | Status: DC | PRN
  Administered 2021-07-17 (×2): 650 mg via ORAL

## 2021-07-17 MED ORDER — PANTOPRAZOLE SODIUM 40 MG PO TBEC
DELAYED_RELEASE_TABLET | ORAL | Status: AC
  Filled 2021-07-17: qty 1

## 2021-07-17 MED ORDER — OXYCODONE HCL 5 MG PO TABS: 5.0000 mg | ORAL_TABLET | Freq: Once | ORAL | Status: DC | PRN

## 2021-07-17 MED ORDER — SODIUM CHLORIDE 0.9 % IR SOLN
Status: DC | PRN
  Administered 2021-07-17 (×2): 6000 mL via INTRAVESICAL

## 2021-07-17 MED ORDER — ONDANSETRON HCL 4 MG/2ML IJ SOLN: 4.0000 mg | INTRAMUSCULAR | Status: DC | PRN

## 2021-07-17 MED ORDER — BELLADONNA ALKALOIDS-OPIUM 16.2-30 MG RE SUPP
RECTAL | Status: AC
  Filled 2021-07-17: qty 1

## 2021-07-17 MED ORDER — PHENOL 1.4 % MT LIQD: 1.0000 | OROMUCOSAL | Status: DC | PRN

## 2021-07-17 MED ORDER — HYDROMORPHONE HCL 1 MG/ML IJ SOLN
0.2500 mg | INTRAMUSCULAR | Status: DC | PRN
  Administered 2021-07-17 (×2): 0.25 mg via INTRAVENOUS

## 2021-07-17 MED ORDER — PROPOFOL 10 MG/ML IV BOLUS
INTRAVENOUS | Status: AC
  Filled 2021-07-17: qty 40

## 2021-07-17 MED ORDER — GLYCOPYRROLATE 0.2 MG/ML IJ SOLN
INTRAMUSCULAR | Status: DC | PRN
  Administered 2021-07-17: .2 mg via INTRAVENOUS

## 2021-07-17 SURGICAL SUPPLY — 30 items
BAG DRAIN URO-CYSTO SKYTR STRL (DRAIN) ×2 IMPLANT
BAG DRN RND TRDRP ANRFLXCHMBR (UROLOGICAL SUPPLIES) ×1
BAG DRN UROCATH (DRAIN) ×1
BAG URINE DRAIN 2000ML AR STRL (UROLOGICAL SUPPLIES) ×2 IMPLANT
BAG URINE LEG 500ML (DRAIN) IMPLANT
CATH FOLEY 2WAY SLVR  5CC 20FR (CATHETERS)
CATH FOLEY 2WAY SLVR  5CC 22FR (CATHETERS)
CATH FOLEY 2WAY SLVR 30CC 22FR (CATHETERS) IMPLANT
CATH FOLEY 2WAY SLVR 5CC 20FR (CATHETERS) IMPLANT
CATH FOLEY 2WAY SLVR 5CC 22FR (CATHETERS) IMPLANT
CATH FOLEY 3WAY 30CC 22FR (CATHETERS) IMPLANT
CATH HEMA 3WAY 30CC 24FR COUDE (CATHETERS) IMPLANT
CATH HEMA 3WAY 30CC 24FR RND (CATHETERS) ×1 IMPLANT
CLOTH BEACON ORANGE TIMEOUT ST (SAFETY) ×2 IMPLANT
ELECT REM PT RETURN 9FT ADLT (ELECTROSURGICAL)
ELECTRODE REM PT RTRN 9FT ADLT (ELECTROSURGICAL) ×1 IMPLANT
EVACUATOR MICROVAS BLADDER (UROLOGICAL SUPPLIES) ×1 IMPLANT
GLOVE SURG ENC MOIS LTX SZ8 (GLOVE) ×2 IMPLANT
GOWN STRL REUS W/TWL XL LVL3 (GOWN DISPOSABLE) ×2 IMPLANT
HOLDER FOLEY CATH W/STRAP (MISCELLANEOUS) IMPLANT
KIT TURNOVER CYSTO (KITS) ×2 IMPLANT
LOOP CUT BIPOLAR 24F LRG (ELECTROSURGICAL) ×2 IMPLANT
MANIFOLD NEPTUNE II (INSTRUMENTS) ×2 IMPLANT
PACK CYSTO (CUSTOM PROCEDURE TRAY) ×2 IMPLANT
PLUG CATH AND CAP STER (CATHETERS) IMPLANT
SYR 30ML LL (SYRINGE) IMPLANT
SYR TOOMEY IRRIG 70ML (MISCELLANEOUS)
SYRINGE TOOMEY IRRIG 70ML (MISCELLANEOUS) IMPLANT
TUBE CONNECTING 12X1/4 (SUCTIONS) ×2 IMPLANT
TUBING UROLOGY SET (TUBING) ×1 IMPLANT

## 2021-07-17 NOTE — Interval H&P Note (Signed)
History and Physical Interval Note:  07/17/2021 7:29 AM  Ryan [name]  has presented today for surgery, with the diagnosis of BENIGN PROSTATIC HYPERPLASIS, URETHRAL LESION.  The various methods of treatment have been discussed with the patient and family. After consideration of risks, benefits and other options for treatment, the patient has consented to  Procedure(s) with comments: [place] (TURP) (N/A) - 1 HR as a surgical intervention.  The patient's history has been reviewed, patient examined, no change in status, stable for surgery.  I have reviewed the patient's chart and labs.  Questions were answered to the patient's satisfaction.     Lillette Boxer Kimberely Mccannon

## 2021-07-17 NOTE — Op Note (Signed)
Preop diagnosis: BPH with obstruction  Postoperative diagnosis: Same  Principal procedure: Cystoscopy, transurethral resection of prostate  Surgeon: Brayam Boeke  Anesthesia: General with LMA  Complications: None  Specimens: 1.  Prostate chips 2.  Apical prostate chips  Estimated blood loss: Less than 50 mL  Drains: None  Indications: 75 year old male with BPH and significant obstructive symptomatology despite medical management.  He has had intermittent retention in the past.  He presents at this time for TURP for management of his obstructive symptoms.  Procedure, risk, complications as well as alternatives have been discussed.  He desires to proceed.  Findings: Bladder appeared normal with no significant urothelial abnormalities.  Ureteral orifices were normal in location and configuration.  There was bilobar hypertrophy with obstructive lateral lobes.  There was a small amount of lobular tissue protruding through the verumontanum.  This was approximately 8 mm in size.  It was resected separately with pathology sent labeled "apical prostate chips).  Description of procedure: The patient was properly identified in the holding area.  Is taken to the operating room where general anesthetic was administered with the LMA.  He is placed in the dorsolithotomy position.  Genitalia and perineum were prepped, draped, proper timeout performed.  64 French resectoscope sheath was passed using the visual obturator.  Cursory inspection was performed of both the bladder and the prostatic fossa.  There was a small amount of lobular tissue protruding from the verumontanum.  I felt that this needed to be resected separately.  Resection was began at the 12 o'clock position from the bladder neck to the distal prostate.  Resection carried down to the surgical capsule.  First the left and then the right prostatic lobes were also resected.  Apical tissue on the right and left was also resected at this time.   Prostate chips were then sent from this resection labeled "prostate chips".  At this point, separate resection was taken in the apical/posterior region, resecting all of the abnormal tissue that was protruding through the verumontanum.  Additional right and left lateral apical tissue was taken as well.  These chips were sent separately, labeled "apical prostate chips".  Following this, there was an excellent, unobstructed prostate fossa.  At this point, all resected sites were carefully cauterized.  There was excellent hemostasis at this point.  Bladder was reinspected and no chips were seen.  At this point the scope was removed, 24 [place] three-way Foley catheter was placed, balloon inflated with 30 cc of water.  CBI was initiated.  There was clear effluent.  At this point the procedure was terminated.  The patient was awakened, taken to the PACU in stable condition, having tolerated the procedure well.

## 2021-07-17 NOTE — Transfer of Care (Signed)
Immediate Anesthesia Transfer of Care Note  Patient: Ryan [name]  Procedure(s) Performed: TRANSURETHRAL RESECTION OF THE PROSTATE (TURP) (Prostate)  Patient Location: PACU  Anesthesia Type:General  Level of Consciousness: awake, alert  and oriented  Airway & Oxygen Therapy: Patient Spontanous Breathing and Patient connected to nasal cannula oxygen  Post-op Assessment: Report given to RN  Post vital signs: Reviewed and stable  Last Vitals:  Vitals Value Taken Time  BP 149/71 07/17/21 0834  Temp    Pulse 56 07/17/21 0837  Resp 18 07/17/21 0837  SpO2 99 % 07/17/21 0837  Vitals shown include unvalidated device data.  Last Pain:  Vitals:   07/17/21 0606  TempSrc:   PainSc: 4       Patients Stated Pain Goal: 6 (36/46/80 3212)  Complications: No notable events documented.

## 2021-07-17 NOTE — Anesthesia Postprocedure Evaluation (Signed)
Anesthesia Post Note  Patient: Ryan [name]  Procedure(s) Performed: TRANSURETHRAL RESECTION OF THE PROSTATE (TURP) (Prostate)     Patient location during evaluation: PACU Anesthesia Type: General Level of consciousness: awake and alert and oriented Pain management: pain level controlled Vital Signs Assessment: post-procedure vital signs reviewed and stable Respiratory status: spontaneous breathing, nonlabored ventilation and respiratory function stable Cardiovascular status: blood pressure returned to baseline and stable Postop Assessment: no apparent nausea or vomiting Anesthetic complications: no   No notable events documented.  Last Vitals:  Vitals:   07/17/21 0915 07/17/21 0936  BP: (!) 161/67 (!) 168/69  Pulse: (!) 50 (!) 50  Resp: 14 17  Temp:  36.6 C  SpO2: 97% 99%    Last Pain:  Vitals:   07/17/21 0915  TempSrc:   PainSc: 9                  Sanjiv Castorena A.

## 2021-07-17 NOTE — Discharge Instructions (Signed)
Transurethral Resection of the Prostate  Care After  Refer to this sheet in the next few weeks. These discharge instructions provide you with general information on caring for yourself after you leave the hospital. Your caregiver may also give you specific instructions. Your treatment has been planned according to the most current medical practices available, but unavoidable complications sometimes occur. If you have any problems or questions after discharge, please call your caregiver.  HOME CARE INSTRUCTIONS   Medications You may receive medicine for pain management. As your level of discomfort decreases, adjustments in your pain medicines may be made.  Take all medicines as directed.  You may be given a medicine (antibiotic) to kill germs following surgery. Finish all medicines. Let your caregiver know if you have any side effects or problems from the medicine.  If you are on aspirin, it would be best not to restart the aspirin until the blood in the urine clears Hygiene You can take a shower after surgery.  You should not take a bath while you still have the urethral catheter. Activity You will be encouraged to get out of bed as much as possible and increase your activity level as tolerated.  Spend the first week in and around your home. For 3 weeks, avoid the following:  Straining.  Running.  Strenuous work.  Walks longer than a few blocks.  Riding for extended periods.  Sexual relations.  Do not lift heavy objects (more than 20 pounds) for at least 1 month. When lifting, use your arms instead of your abdominal muscles.  You will be encouraged to walk as tolerated. Do not exert yourself. Increase your activity level slowly. Remember that it is important to keep moving after an operation of any type. This cuts down on the possibility of developing blood clots.  Your caregiver will tell you when you can resume driving and light housework. Discuss this at your first office visit after  discharge. Diet No special diet is ordered after a TURP. However, if you are on a special diet for another medical problem, it should be continued.  Normal fluid intake is usually recommended.  Avoid alcohol and caffeinated drinks for 2 weeks. They irritate the bladder. Decaffeinated drinks are okay.  Avoid spicy foods.  Bladder Function For the first 10 days, empty the bladder whenever you feel a definite desire. Do not try to hold the urine for long periods of time.  Urinating once or twice a night even after you are healed is not uncommon.  You may see some recurrence of blood in the urine after discharge from the hospital. This usually happens within 2 weeks after the procedure.If this occurs, force fluids again as you did in the hospital and reduce your activity.  Bowel Function You may experience some constipation after surgery. This can be minimized by increasing fluids and fiber in your diet. Drink enough water and fluids to keep your urine clear or pale yellow.  A stool softener may be prescribed for use at home. Do not strain to move your bowels.  If you are requiring increased pain medicine, it is important that you take stool softeners to prevent constipation. This will help to promote proper healing by reducing the need to strain to move your bowels.  Sexual Activity Semen movement in the opposite direction and into the bladder (retrograde ejaculation) may occur. Since the semen passes into the bladder, cloudy urine can occur the first time you urinate after intercourse. Or, you may not have an  ejaculation during erection. Ask your caregiver when you can resume sexual activity. Retrograde ejaculation and reduced semen discharge should not reduce one's pleasure of intercourse.  Postoperative Visit Arrange the date and time of your after surgery visit with your caregiver.  Return to Work After your recovery is complete, you will be able to return to work and resume all activities. Your  caregiver will inform you when you can return to work.  Foley Catheter Care A soft, flexible tube (Foley catheter) may have been placed in your bladder to drain urine and fluid. Follow these instructions: Taking Care of the Catheter Keep the area where the catheter leaves your body clean.  Attach the catheter to the leg so there is no tension on the catheter.  Keep the drainage bag below the level of the bladder, but keep it OFF the floor.  Do not take long soaking baths. Your caregiver will give instructions about showering.  Wash your hands before touching ANYTHING related to the catheter or bag.  Using mild soap and warm water on a washcloth:  Clean the area closest to the catheter insertion site using a circular motion around the catheter.  Clean the catheter itself by wiping AWAY from the insertion site for several inches down the tube.  NEVER wipe upward as this could sweep bacteria up into the urethra (tube in your body that normally drains the bladder) and cause infection.  Place a small amount of sterile lubricant at the tip of the penis where the catheter is entering.  Taking Care of the Drainage Bags Two drainage bags may be taken home: a large overnight drainage bag, and a smaller leg bag which fits underneath clothing.  It is okay to wear the overnight bag at any time, but NEVER wear the smaller leg bag at night.  Keep the drainage bag well below the level of your bladder. This prevents backflow of urine into the bladder and allows the urine to drain freely.  Anchor the tubing to your leg to prevent pulling or tension on the catheter. Use tape or a leg strap provided by the hospital.  Empty the drainage bag when it is 1/2 to 3/4 full. Wash your hands before and after touching the bag.  Periodically check the tubing for kinks to make sure there is no pressure on the tubing which could restrict the flow of urine.  Changing the Drainage Bags Cleanse both ends of the clean bag with  alcohol before changing.  Pinch off the rubber catheter to avoid urine spillage during the disconnection.  Disconnect the dirty bag and connect the clean one.  Empty the dirty bag carefully to avoid a urine spill.  Attach the new bag to the leg with tape or a leg strap.  Cleaning the Drainage Bags Whenever a drainage bag is disconnected, it must be cleaned quickly so it is ready for the next use.  Wash the bag in warm, soapy water.  Rinse the bag thoroughly with warm water.  Soak the bag for 30 minutes in a solution of white vinegar and water (1 cup vinegar to 1 quart warm water).  Rinse with warm water.  SEEK MEDICAL CARE IF:  You have chills or night sweats.  You are leaking around your catheter or have problems with your catheter. It is not uncommon to have sporadic leakage around your catheter as a result of bladder spasms. If the leakage stops, there is not much need for concern. If you are uncertain, call your  caregiver.  You develop side effects that you think are coming from your medicines.  SEEK IMMEDIATE MEDICAL CARE IF:  You are suddenly unable to urinate. Check to see if there are any kinks in the drainage tubing that may cause this. If you cannot find any kinks, call your caregiver immediately. This is an emergency.  You develop shortness of breath or chest pains.  Bleeding persists or clots develop in your urine.  You have a fever.  You develop pain in your back or over your lower belly (abdomen).  You develop pain or swelling in your legs.  Any problems you are having get worse rather than better.  MAKE SURE YOU:  Understand these instructions.  Will watch your condition.  Will get help right away if you are not doing well or get worse.  Document Released: 09/21/2005 Document Revised: 06/03/2011 Document Reviewed: 05/15/2009 [facility] Patient Information 2012 [place].Transurethral Resection of the Prostate Care After Refer to this sheet in the next few weeks. These  discharge instructions provide you with general information on caring for yourself after you leave the hospital. Your caregiver may also give you specific instructions. Your treatment has been planned according to the most current medical practices available, but unavoidable complications sometimes occur. If you have any problems or questions after discharge, please call your caregiver.

## 2021-07-17 NOTE — H&P (Signed)
H&P  Chief Complaint: Large prostate  History of Present Illness: 75 yo [name] presents for TURP for worsening obstructive sx's despite medical mgmt of his BPH.  Past Medical History:  Diagnosis Date   Anemia    Anticoagulant long-term use    changed from eliquis to praxada 06/ 2022--- mananged by cardiology   BPH with urinary obstruction    CAD (coronary artery disease)    cardiologist--- dr g. taylor--- cath 04-22-2011  moderate LM stenosis, borderline sig pRCA, sig stenosis mid to distal LAD ;  aggressive medical therapy   Chronic diastolic CHF (congestive heart failure) ([place]) 03/2018   followed by cardiology   CKD (chronic kidney disease), stage II    COPD (chronic obstructive pulmonary disease) ([place])    followed by pcp   DOE (dyspnea on exertion)    per pt when walk >100yds,  per pt rides outside bike 4-8 miles daily without sob,  household chores and yard work without sob   Eczema    GERD (gastroesophageal reflux disease)    Glaucoma, left eye    Heart murmur    Hemorrhoids    History of adenomatous polyp of colon    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 Bryan Lemma--- dx by biopsy 01-28-2021   Macular degeneration of both eyes    Osteoporosis 05/31/2016   PAF (paroxysmal atrial fibrillation) ([place]) 03/2012   cardiologist-- dr g. taylor;  first dx 06/ 2013 AFlutter w/ RVR   Urethral lesion     Vitamin D deficiency     Past Surgical History:  Procedure Laterality Date   ANTERIOR CERVICAL DECOMP/DISCECTOMY FUSION  06/08/2002   @[facility] ;  C5--C7   [place]  04/22/2011   moderate left main and RCA stenosis not significant by FFR and IVUS on medical therapy   CARDIOVERSION N/A 04/25/2018   Procedure: CARDIOVERSION;  Surgeon: Skeet Latch, MD;  Location: [facility];  Service: Cardiovascular;  Laterality: N/A;   CATARACT EXTRACTION W/ INTRAOCULAR LENS IMPLANT Bilateral 2021   COLONOSCOPY WITH ESOPHAGOGASTRODUODENOSCOPY (EGD)  01/28/2021   by [id number]   EXTRACORPOREAL SHOCK WAVE LITHOTRIPSY     x2  1990s   FOOT SURGERY Right 1970   calcification removed from top of foot   KNEE ARTHROSCOPY Right 1991   LAPAROSCOPIC CHOLECYSTECTOMY  2000   TOTAL KNEE ARTHROPLASTY Right 08/14/2009   @[facility]     Home Medications:    Allergies:  Allergies  Allergen Reactions   Albumin (Human) Anaphylaxis   Polymyxin B-Trimethoprim Swelling    Eye drops made eyes swell   Pseudoephedrine Other (See Comments)    Stomach cramps   Codeine Hives, Itching and Rash   Guaiacol Other (See Comments)    Hallucinations   Statins Other (See Comments)  Muscle cramps    Gabapentin Other (See Comments)    unk   Meloxicam Other (See Comments)    Unknown reaction   Pseudoephedrine-Guaifenesin Nausea And Vomiting    Stomach cramps   Rosuvastatin Calcium Other (See Comments)    Muscle aches   Tapentadol Other (See Comments)    Unknown reaction   Ciprofloxacin Hives, Itching, Nausea Only and Rash   Moxifloxacin Nausea Only and Other (See Comments)    Headaches, stomach cramps    Rofecoxib Other (See Comments)    Stomach cramping    Family History  Problem Relation Age of Onset   Heart disease Mother    Diabetes Mother    Cirrhosis Mother    Emphysema Mother        never smoked but 2nd hand through her spouse   Hypertension Mother    Macular degeneration  Mother    Heart disease Father    Cancer Father        prostate   Hyperlipidemia Father    Hypertension Father    Varicose Veins Father    Heart attack Father    Peripheral vascular disease Father    Heart disease Sister    Arthritis Sister    Hyperlipidemia Sister    Obesity Sister    Macular degeneration Sister    Heart disease Brother        5 stents   Hyperlipidemia Brother    Macular degeneration Maternal Grandfather    Cirrhosis Sister    Obesity Sister    Arthritis Sister    Heart disease Sister    Obesity Sister    Liver disease Other    Prostate cancer Other    Coronary artery disease Other    Colon cancer Neg Hx    Esophageal cancer Neg Hx    Rectal cancer Neg Hx    Stomach cancer Neg Hx     Social History:  reports that he quit smoking about 12 years ago. His smoking use included cigarettes. He has a 25.00 pack-year smoking history. He quit smokeless tobacco use about 27 years ago.  His smokeless tobacco use included chew. He reports current alcohol use of about 1.0 standard drink per week. He reports that he does not use drugs.  ROS: A complete review of systems was performed.  All systems are negative except for pertinent findings as noted.  Physical Exam:  Vital signs in last 24 hours: BP (!) 163/55   Pulse (!) 54   Temp (!) 97.4 F (36.3 C) (Oral)   Resp 16   Ht 5\' 4"  (1.626 m)   Wt 78.6 kg   SpO2 97%   BMI 29.73 kg/m  Constitutional:  Alert and oriented, No acute distress Cardiovascular: Regular rate  Respiratory: Normal respiratory effort GI: Abdomen is soft, nontender, nondistended, no abdominal masses. No CVAT.  Genitourinary: Normal [name] phallus, testes are descended bilaterally and non-tender and without masses, scrotum is normal in appearance without lesions or masses, perineum is normal on inspection. Lymphatic: No lymphadenopathy Neurologic: Grossly intact, no focal deficits Psychiatric: Normal mood and affect  Laboratory Data:  No  results for input(s): WBC, HGB, HCT, PLT in the last 72 hours.  No results for input(s): NA, K, CL, GLUCOSE, BUN, CALCIUM, CREATININE in the last 72 hours.  Invalid input(s): CO3   No results found for this or any previous visit (from the past 24 hour(s)). Recent Results (from the past 240 hour(s))  SARS Coronavirus 2 (TAT 6-24 hrs)  Status: None   Collection Time: 07/14/21 12:00 AM  Result Value Ref Range Status   SARS Coronavirus 2 RESULT: NEGATIVE  Final    Comment: RESULT: NEGATIVESARS-CoV-2 INTERPRETATION:A NEGATIVE  test result means that SARS-CoV-2 RNA was not present in the specimen above the limit of detection of this test. This does not preclude a possible SARS-CoV-2 infection and should not be used as the  sole basis for patient management decisions. Negative results must be combined with clinical observations, patient history, and epidemiological information. Optimum specimen types and timing for peak viral levels during infections caused by SARS-CoV-2  have not been determined. Collection of multiple specimens or types of specimens may be necessary to detect virus. Improper specimen collection and handling, sequence variability under primers/probes, or organism present below the limit of detection may  lead to false negative results. Positive and negative predictive values of testing are highly dependent on prevalence. False negative test results are more likely when prevalence of disease is high.The expected result is NEGATIVE.Fact S heet for  Healthcare Providers: LocalChronicle.no Sheet for Patients: SalonLookup.es Reference Range - Negative     Renal Function: No results for input(s): CREATININE in the last 168 hours. CrCl cannot be calculated (Patient's most recent lab result is older than the maximum 21 days allowed.).  Radiologic Imaging: No results found.  Impression/Assessment:  BPH w/  obstruction  Plan:  TURP

## 2021-07-17 NOTE — Anesthesia Procedure Notes (Signed)
Procedure Name: LMA Insertion Date/Time: 07/17/2021 7:39 AM Performed by: Bonney Aid, CRNA Pre-anesthesia Checklist: Patient identified, Emergency Drugs available, Suction available and Patient being monitored Patient Re-evaluated:Patient Re-evaluated prior to induction Oxygen Delivery Method: Circle system utilized Preoxygenation: Pre-oxygenation with 100% oxygen Induction Type: IV induction Ventilation: Mask ventilation without difficulty LMA: LMA inserted LMA Size: 4.0 Number of attempts: 1 Airway Equipment and Method: Bite block Placement Confirmation: positive ETCO2 Tube secured with: Tape Dental Injury: Teeth and Oropharynx as per pre-operative assessment

## 2021-07-18 ENCOUNTER — Encounter (HOSPITAL_BASED_OUTPATIENT_CLINIC_OR_DEPARTMENT_OTHER): Payer: Self-pay | Admitting: Urology

## 2021-07-18 DIAGNOSIS — R238 Other skin changes: Secondary | ICD-10-CM | POA: Diagnosis not present

## 2021-07-18 MED ORDER — AMOXICILLIN 500 MG PO CAPS: 500.0000 mg | ORAL_CAPSULE | Freq: Two times a day (BID) | ORAL | 0 refills | Status: DC

## 2021-07-18 NOTE — Progress Notes (Signed)
CBI and foley cath d/c per order.  Instruction of how to collect a string of bottles given to pt w/ voiced understanding.

## 2021-07-19 ENCOUNTER — Other Ambulatory Visit: Payer: Self-pay

## 2021-07-19 ENCOUNTER — Emergency Department (HOSPITAL_COMMUNITY)
Admission: EM | Admit: 2021-07-19 | Discharge: 2021-07-19 | Disposition: A | Discharge status: Home / Self Care | Payer: Medicare Other | Attending: Emergency Medicine | Admitting: Emergency Medicine

## 2021-07-19 ENCOUNTER — Encounter (HOSPITAL_COMMUNITY): Payer: Self-pay | Admitting: *Deleted

## 2021-07-19 DIAGNOSIS — N182 Chronic kidney disease, stage 2 (mild): Secondary | ICD-10-CM | POA: Insufficient documentation

## 2021-07-19 DIAGNOSIS — Z96651 Presence of right artificial knee joint: Secondary | ICD-10-CM | POA: Diagnosis not present

## 2021-07-19 DIAGNOSIS — Z7901 Long term (current) use of anticoagulants: Secondary | ICD-10-CM | POA: Insufficient documentation

## 2021-07-19 DIAGNOSIS — J449 Chronic obstructive pulmonary disease, unspecified: Secondary | ICD-10-CM | POA: Insufficient documentation

## 2021-07-19 DIAGNOSIS — R310 Gross hematuria: Secondary | ICD-10-CM

## 2021-07-19 DIAGNOSIS — R6889 Other general symptoms and signs: Secondary | ICD-10-CM | POA: Diagnosis not present

## 2021-07-19 DIAGNOSIS — R319 Hematuria, unspecified: Secondary | ICD-10-CM | POA: Diagnosis present

## 2021-07-19 DIAGNOSIS — Z79899 Other long term (current) drug therapy: Secondary | ICD-10-CM | POA: Insufficient documentation

## 2021-07-19 DIAGNOSIS — Z87891 Personal history of nicotine dependence: Secondary | ICD-10-CM | POA: Insufficient documentation

## 2021-07-19 DIAGNOSIS — I5032 Chronic diastolic (congestive) heart failure: Secondary | ICD-10-CM | POA: Insufficient documentation

## 2021-07-19 DIAGNOSIS — I2510 Atherosclerotic heart disease of native coronary artery without angina pectoris: Secondary | ICD-10-CM | POA: Insufficient documentation

## 2021-07-19 DIAGNOSIS — R339 Retention of urine, unspecified: Secondary | ICD-10-CM | POA: Insufficient documentation

## 2021-07-19 DIAGNOSIS — D72829 Elevated white blood cell count, unspecified: Secondary | ICD-10-CM | POA: Diagnosis not present

## 2021-07-19 DIAGNOSIS — I480 Paroxysmal atrial fibrillation: Secondary | ICD-10-CM | POA: Diagnosis not present

## 2021-07-19 DIAGNOSIS — I130 Hypertensive heart and chronic kidney disease with heart failure and stage 1 through stage 4 chronic kidney disease, or unspecified chronic kidney disease: Secondary | ICD-10-CM | POA: Diagnosis not present

## 2021-07-19 DIAGNOSIS — D649 Anemia, unspecified: Secondary | ICD-10-CM | POA: Insufficient documentation

## 2021-07-19 LAB — CBC WITH DIFFERENTIAL/PLATELET
Abs Immature Granulocytes: 0.1 10*3/uL — ABNORMAL HIGH (ref 0.00–0.07)
Basophils Absolute: 0.1 10*3/uL (ref 0.0–0.1)
Basophils Relative: 0 %
Eosinophils Absolute: 0.2 10*3/uL (ref 0.0–0.5)
Eosinophils Relative: 2 %
HCT: 32.6 % — ABNORMAL LOW (ref 39.0–52.0)
Hemoglobin: 10.9 g/dL — ABNORMAL LOW (ref 13.0–17.0)
Immature Granulocytes: 1 %
Lymphocytes Relative: 18 %
Lymphs Abs: 2.2 10*3/uL (ref 0.7–4.0)
MCH: 31.4 pg (ref 26.0–34.0)
MCHC: 33.4 g/dL (ref 30.0–36.0)
MCV: 93.9 fL (ref 80.0–100.0)
Monocytes Absolute: 1 10*3/uL (ref 0.1–1.0)
Monocytes Relative: 8 %
Neutro Abs: 8.7 10*3/uL — ABNORMAL HIGH (ref 1.7–7.7)
Neutrophils Relative %: 71 %
Platelets: 180 10*3/uL (ref 150–400)
RBC: 3.47 MIL/uL — ABNORMAL LOW (ref 4.22–5.81)
RDW: 12.6 % (ref 11.5–15.5)
WBC: 12.4 10*3/uL — ABNORMAL HIGH (ref 4.0–10.5)
nRBC: 0 % (ref 0.0–0.2)

## 2021-07-19 LAB — URINALYSIS, ROUTINE W REFLEX MICROSCOPIC
Bilirubin Urine: NEGATIVE
Glucose, UA: NEGATIVE mg/dL
Ketones, ur: NEGATIVE mg/dL
Leukocytes,Ua: NEGATIVE
Nitrite: NEGATIVE
Protein, ur: 100 mg/dL — AB
RBC / HPF: >50 RBC/hpf — ABNORMAL HIGH (ref 0–5)
Specific Gravity, Urine: 1.01 (ref 1.005–1.030)
pH: 5 (ref 5.0–8.0)

## 2021-07-19 LAB — BASIC METABOLIC PANEL
Anion gap: 9 (ref 5–15)
BUN: 27 mg/dL — ABNORMAL HIGH (ref 8–23)
CO2: 21 mmol/L — ABNORMAL LOW (ref 22–32)
Calcium: 8.8 mg/dL — ABNORMAL LOW (ref 8.9–10.3)
Chloride: 103 mmol/L (ref 98–111)
Creatinine, Ser: 1.42 mg/dL — ABNORMAL HIGH (ref 0.61–1.24)
GFR, Estimated: 52 mL/min — ABNORMAL LOW (ref >60)
Glucose, Bld: 90 mg/dL (ref 70–99)
Potassium: 4.5 mmol/L (ref 3.5–5.1)
Sodium: 133 mmol/L — ABNORMAL LOW (ref 135–145)

## 2021-07-19 LAB — HEMOGLOBIN AND HEMATOCRIT, BLOOD
HCT: 29.8 % — ABNORMAL LOW (ref 39.0–52.0)
Hemoglobin: 9.9 g/dL — ABNORMAL LOW (ref 13.0–17.0)

## 2021-07-19 MED ORDER — LIDOCAINE HCL URETHRAL/MUCOSAL 2 % EX GEL
1.0000 "application " | Freq: Once | CUTANEOUS | Status: AC
  Administered 2021-07-19: 1 via URETHRAL
  Filled 2021-07-19: qty 11

## 2021-07-19 MED ORDER — OXYBUTYNIN CHLORIDE 5 MG PO TABS: 5.0000 mg | ORAL_TABLET | Freq: Three times a day (TID) | ORAL | 0 refills | Status: DC | PRN

## 2021-07-19 MED ORDER — ONDANSETRON HCL 4 MG/2ML IJ SOLN
4.0000 mg | Freq: Once | INTRAMUSCULAR | Status: AC
  Administered 2021-07-19: 4 mg via INTRAVENOUS
  Filled 2021-07-19: qty 2

## 2021-07-19 MED ORDER — FENTANYL CITRATE PF 50 MCG/ML IJ SOSY
50.0000 ug | PREFILLED_SYRINGE | Freq: Once | INTRAMUSCULAR | Status: AC
  Administered 2021-07-19: 50 ug via INTRAVENOUS
  Filled 2021-07-19: qty 1

## 2021-07-19 NOTE — Discharge Instructions (Addendum)
You may take the oxybutynin which is an antispasm medicine 3 times daily as needed.  If you develop any lightheadedness or dizziness return to the emergency department otherwise follow-up with urology on Monday.  If your catheter is unable to drain please evaluation emergency department as well

## 2021-07-19 NOTE — ED Notes (Signed)
Easy placement of foley. Tolerated well.

## 2021-07-19 NOTE — ED Notes (Signed)
Pt ambulated in room, pt stated he wasn't dizzy or lightheaded. Just had pain in head of penis and lower abdominal area

## 2021-07-19 NOTE — ED Provider Notes (Signed)
[facility] DEPT Provider Note   CSN: [id number] Arrival date & time: 07/19/21  1534     History Urinary retention  Ryan [name] is a 75 y.o. male with past medical history significant for chronic anticoagulation (Pradaxa) due to A. fib CHF s/p TURP 2 days ago Dr. Diona Fanti presents for evaluation urinary retention.  Not having any difficulties up until this morning.  States he attempted to use the restroom when he was unable to.  States initially had some hematuria, then started having some clots, subsequently not able to urinate.  He is in pain, tossing and turning in bed.  Rates his pain a 10/10.  Has not take anything for his pain.  No fever, chills, nausea, vomiting, chest pain, shortness of breath, constipation, weakness or lightheadedness.  Denies additional aggravating or alleviating factors.  Has not restarted his Anticoag since surgery.  History obtained from patient and past medical records.  No interpreter used.  HPI     Past Medical History:  Diagnosis Date   Anemia    Anticoagulant long-term use    changed from eliquis to praxada 06/ 2022--- mananged by cardiology   BPH with urinary obstruction    CAD (coronary artery disease)    cardiologist--- dr g. taylor--- cath 04-22-2011  moderate LM stenosis, borderline sig pRCA, sig stenosis mid to distal LAD ;  aggressive medical therapy   Chronic diastolic CHF (congestive heart failure) ([place]) 03/2018   followed by cardiology   CKD (chronic kidney disease), stage II    COPD (chronic obstructive pulmonary disease) ([place])    followed by pcp   DOE (dyspnea on exertion)    per pt when walk >100yds,  per pt rides outside bike 4-8 miles daily without sob,  household chores and yard work without sob   Eczema    GERD (gastroesophageal reflux disease)    Glaucoma, left eye    Heart murmur    Hemorrhoids    History of adenomatous polyp of colon    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 Bryan Lemma--- dx by biopsy 01-28-2021   Macular degeneration of both eyes    Osteoporosis 05/31/2016   PAF (paroxysmal atrial fibrillation) ([place]) 03/2012   cardiologist-- dr g. 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/10/2020  Elevated troponin 04/09/2020   Hypomagnesemia 04/09/2020   Hypokalemia 04/09/2020   Acute urinary retention 04/09/2020   Statin intolerance 12/19/2019   Vitamin D deficiency 09/18/2019   CHF (congestive heart failure) ([place]) 03/22/2018   PAF (paroxysmal atrial fibrillation) ([place]) 03/22/2018   Edema 03/22/2018   H/O: rheumatic fever 12/27/2017   Hyperglycemia 06/21/2017   Kidney stone 06/21/2017   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   Osteoporosis 05/31/2016   BPH (benign prostatic hyperplasia) 01/20/2015   Erectile dysfunction 01/20/2015   Rectal bleeding 01/20/2015   Preventative health care 01/20/2015   Eczema 08/03/2014   Sleep apnea 03/01/2014   Anticoagulation adequate with  anticoagulant therapy 03/01/2014   Pre-syncope 02/26/2014   Abdominal pain 02/26/2014   PAD (peripheral artery disease) ([place]) 02/26/2014   Constipation 03/06/2013   Easy bruising 03/06/2013   History of alcohol abuse 04/15/2012   CAD (coronary atherosclerotic disease) 05/08/2011   Atypical chest pain 03/10/2011   LUMBAR RADICULOPATHY, 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   UNS ADVRS EFF UNS RX MEDICINAL&BIOLOGICAL SBSTNC 11/11/2009   H/O tobacco use, presenting hazards to health 07/29/2009   COPD GOLD I 07/29/2009   Hyperlipidemia, mixed 06/20/2007   Macular degeneration (senile) of retina 06/20/2007   Essential hypertension 06/20/2007   GERD 06/16/2007    Past Surgical History:  Procedure Laterality Date   ANTERIOR CERVICAL DECOMP/DISCECTOMY FUSION  06/08/2002   @[facility] ;  C5--C7   [place]  04/22/2011   moderate left main and RCA stenosis not significant by FFR and IVUS on medical therapy   CARDIOVERSION N/A 04/25/2018   Procedure: CARDIOVERSION;  Surgeon: Skeet Latch, MD;  Location: [facility];  Service: Cardiovascular;  Laterality: N/A;   CATARACT EXTRACTION W/ INTRAOCULAR LENS IMPLANT Bilateral 2021   COLONOSCOPY WITH ESOPHAGOGASTRODUODENOSCOPY (EGD)  01/28/2021   by OEVOJJ   [place] LITHOTRIPSY     x2  1990s   FOOT SURGERY Right 1970   calcification removed from top of foot   KNEE ARTHROSCOPY Right 1991   LAPAROSCOPIC CHOLECYSTECTOMY  2000   TOTAL KNEE ARTHROPLASTY Right 08/14/2009   @[facility]    TRANSURETHRAL RESECTION OF PROSTATE N/A 07/17/2021   Procedure: TRANSURETHRAL RESECTION OF THE PROSTATE (TURP);  Surgeon: [name], MD;  Location: [facility];  Service: Urology;  Laterality: N/A;  1 HR       Family History  Problem Relation Age of Onset   Heart disease Mother    Diabetes Mother    Cirrhosis Mother    Emphysema Mother        never smoked but  2nd hand through her spouse   Hypertension Mother    Macular degeneration Mother    Heart disease Father    Cancer Father        prostate   Hyperlipidemia Father    Hypertension Father    Varicose Veins Father    Heart attack Father    Peripheral vascular disease Father    Heart disease Sister    Arthritis Sister    Hyperlipidemia Sister    Obesity Sister    Macular degeneration Sister    Heart disease Brother        5 stents   Hyperlipidemia Brother    Macular degeneration Maternal Grandfather    Cirrhosis Sister    Obesity Sister    Arthritis Sister    Heart disease Sister    Obesity Sister    Liver disease Other  Prostate cancer Other    Coronary artery disease Other    Colon cancer Neg Hx    Esophageal cancer Neg Hx    Rectal cancer Neg Hx    Stomach cancer Neg Hx     Social History   Tobacco Use   Smoking status: Former    Packs/day: 0.50    Years: 50.00    Pack years: 25.00    Types: Cigarettes    Quit date: 05/01/2009    Years since quitting: 12.2   Smokeless tobacco: Former    Types: Chew    Quit date: 1995  Vaping Use   Vaping Use: Never used  Substance Use Topics   Alcohol use: Yes    Alcohol/week: 1.0 standard drink    Types: 1 Cans of beer per week    Comment: 1 beer daily   Drug use: Never    Home Medications Prior to Admission medications   Medication Sig Start Date End Date Taking? Authorizing Provider  amoxicillin (AMOXIL) 500 MG capsule Take 1 capsule (500 mg total) by mouth every 12 (twelve) hours. 07/18/21  Yes Dahlstedt, Annie Main, MD  brimonidine (ALPHAGAN) 0.2 % ophthalmic solution Place 1 drop into the left eye in the morning and at bedtime.  09/10/17  Yes [provider]  budesonide (ENTOCORT EC) 3 MG 24 hr capsule Take 2 capsules (6 mg total) by mouth daily for 42 days, THEN 1 capsule (3 mg total) daily. Patient taking differently: Take 2 capsules (6 mg total) by mouth daily for 42 days, THEN 1 capsule (3 mg total)  daily. 07-15-2021 Per pt currently take 2 tabs in am 06/19/21 09/29/21 Yes Cirigliano, Vito V, DO  furosemide (LASIX) 20 MG tablet Take 1 tablet (20 mg total) by mouth daily as needed for edema (weight gain>3#/24 hours). Patient taking differently: Take 20 mg by mouth daily as needed for edema (weight gain>3#/24 hours). 05/16/21  Yes [name], MD  latanoprost (XALATAN) 0.005 % ophthalmic solution Place 1 drop into the left eye at bedtime.    Yes [provider]  levalbuterol (XOPENEX HFA) 45 MCG/ACT inhaler Inhale 2 puffs into the lungs every 4 (four) hours as needed for wheezing. 06/05/20 07/19/21 Yes Saguier, Percell Miller, PA-C  MAGnesium-Oxide 400 (240 Mg) MG tablet TAKE 1 TABLET(400 MG) BY MOUTH THREE TIMES DAILY AS NEEDED Patient taking differently: Take 400 mg by mouth 2 (two) times daily. 05/26/21  Yes [name], MD  metoprolol tartrate (LOPRESSOR) 25 MG tablet TAKE 1 TABLET BY MOUTH  TWICE DAILY Patient taking differently: Take 25 mg by mouth 2 (two) times daily. 06/11/21  Yes Evans Lance, MD  nitroGLYCERIN (NITROSTAT) 0.4 MG SL tablet Place 1 tablet (0.4 mg total) under the tongue every 5 (five) minutes as needed for chest pain. 04/14/20  Yes Allie Bossier, MD  omeprazole (PRILOSEC OTC) 20 MG tablet Take 20 mg by mouth daily.   Yes [provider]  oxybutynin (DITROPAN) 5 MG tablet Take 1 tablet (5 mg total) by mouth 3 (three) times daily as needed for up to 7 days for bladder spasms. 07/19/21 07/26/21 Yes Kamiya Acord A, PA-C  potassium chloride (KLOR-CON) 10 MEQ tablet TAKE 1 TABLET BY MOUTH  DAILY Patient taking differently: Take 10 mEq by mouth daily. 06/10/21  Yes Evans Lance, MD  propafenone (RYTHMOL) 225 MG tablet TAKE 1 TABLET BY MOUTH  TWICE DAILY Patient taking differently: Take 225 mg by mouth 2 (two) times daily. 11/12/20  Yes Lovena Le,  Champ Mungo, MD  Ranibizumab (LUCENTIS IO) Inject 1 Dose into the eye once as needed (Macular degeneration).   Yes  [provider]  tamsulosin (FLOMAX) 0.4 MG CAPS capsule Take 0.4 mg by mouth 2 (two) times daily.   Yes [provider]  Cholecalciferol (VITAMIN D3) 5000 units CAPS Take 10,000 Units by mouth every other day.  Patient not taking: Reported on 07/19/2021    [provider]  clobetasol (TEMOVATE) 0.05 % external solution APPLY 1 APPLICATION TOPICALLY TWICE DAILY Patient not taking: No sig reported 11/20/16   [name], MD  dabigatran (PRADAXA) 150 MG CAPS capsule Take 1 capsule (150 mg total) by mouth 2 (two) times daily. Patient not taking: No sig reported 05/21/21   Evans Lance, MD  Multiple Vitamins-Minerals (ICAPS AREDS 2 PO) Take 1 capsule by mouth 2 (two) times daily. Patient not taking: Reported on 07/19/2021    [provider]    Allergies    Albumin (human), Polymyxin b-trimethoprim, Pseudoephedrine, Codeine, Guaiacol, Statins, Gabapentin, Meloxicam, Pseudoephedrine-guaifenesin, Rosuvastatin calcium, Tapentadol, Ciprofloxacin, Moxifloxacin, and Rofecoxib  Review of Systems   Review of Systems  Constitutional: Negative.   HENT: Negative.    Respiratory: Negative.    Cardiovascular: Negative.   Gastrointestinal:  Positive for abdominal distention (lower abdomen) and abdominal pain. Negative for anal bleeding, blood in stool, constipation, diarrhea, nausea, rectal pain and vomiting.  Genitourinary:  Positive for decreased urine volume, difficulty urinating and urgency.  Musculoskeletal: Negative.   Skin: Negative.   Neurological: Negative.   All other systems reviewed and are negative.  Physical Exam Updated Vital Signs BP (!) 149/68   Pulse (!) 55   Temp 97.9 F (36.6 C) (Oral)   Resp 20   SpO2 97%   Physical Exam Vitals and nursing note reviewed.  Constitutional:      General: He is in acute distress.     Appearance: He is well-developed. He is not ill-appearing, toxic-appearing or diaphoretic.     Comments: Tossing and  turning in bed, difficult exam due to pain  HENT:     Head: Normocephalic and atraumatic.     Nose: Nose normal.     Mouth/Throat:     Mouth: Mucous membranes are moist.  Eyes:     Pupils: Pupils are equal, round, and reactive to light.  Cardiovascular:     Rate and Rhythm: Normal rate and regular rhythm.     Pulses: Normal pulses.     Heart sounds: Normal heart sounds.  Pulmonary:     Effort: Pulmonary effort is normal. No respiratory distress.     Breath sounds: Normal breath sounds.  Abdominal:     General: Bowel sounds are normal. There is distension.     Palpations: Abdomen is soft.     Tenderness: There is abdominal tenderness.     Comments: Suprapubic distention and moderate tenderness.  Musculoskeletal:        General: No tenderness, deformity or signs of injury. Normal range of motion.     Cervical back: Normal range of motion and neck supple.  Skin:    General: Skin is warm and dry.     Capillary Refill: Capillary refill takes less than 2 seconds.  Neurological:     General: No focal deficit present.     Mental Status: He is alert and oriented to person, place, and time.   ED Results / Procedures / Treatments   Labs (all labs ordered are listed, but only abnormal results are  displayed) Labs Reviewed  URINALYSIS, ROUTINE W REFLEX MICROSCOPIC - Abnormal; Notable for the following components:      Result Value   Color, Urine RED (*)    APPearance HAZY (*)    Hgb urine dipstick MODERATE (*)    Protein, ur 100 (*)    RBC / HPF >50 (*)    Bacteria, UA FEW (*)    All other components within normal limits  CBC WITH DIFFERENTIAL/PLATELET - Abnormal; Notable for the following components:   WBC 12.4 (*)    RBC 3.47 (*)    Hemoglobin 10.9 (*)    HCT 32.6 (*)    Neutro Abs 8.7 (*)    Abs Immature Granulocytes 0.10 (*)    All other components within normal limits  BASIC METABOLIC PANEL - Abnormal; Notable for the following components:   Sodium 133 (*)    CO2 21 (*)     BUN 27 (*)    Creatinine, Ser 1.42 (*)    Calcium 8.8 (*)    GFR, Estimated 52 (*)    All other components within normal limits  HEMOGLOBIN AND HEMATOCRIT, BLOOD - Abnormal; Notable for the following components:   Hemoglobin 9.9 (*)    HCT 29.8 (*)    All other components within normal limits  URINE CULTURE  RESP PANEL BY RT-PCR (FLU A&B, COVID) ARPGX2    EKG None  Radiology No results found.  Procedures Procedures   Medications Ordered in ED Medications  fentaNYL (SUBLIMAZE) injection 50 mcg (50 mcg Intravenous Given 07/19/21 1632)  ondansetron (ZOFRAN) injection 4 mg (4 mg Intravenous Given 07/19/21 1632)  lidocaine (XYLOCAINE) 2 % jelly 1 application (1 application Urethral Given 07/19/21 1631)    ED Course  I have reviewed the triage vital signs and the nursing notes.  Pertinent labs & imaging results that were available during my care of the patient were reviewed by me and considered in my medical decision making (see chart for details).  Here for evaluation of hematuria and urinary retention s/p TURP 2 days ago with Dr. Diona Fanti.  Afebrile, nonseptic, not ill-appearing however does appear to be in severe pain.  Unable to perform bladder scan on arrival due to pain, patient tossing and turning.  He has no systemic symptoms.  On pradaxa chronically however has not restarted since his surgery.  Consult with urology resident.  Recommends placing Foley catheter, hold on three-way, see if we can do some gentle manual irrigation, hold on imaging at this time.  Labs personally reviewed and interpreted:  CBC with leukocytosis at 10.9, prior 12.9 >>2 days ago now 9.9 UA with blood, no infection BMP at baseline  Patient reassessed. Feels improved. >900cc removed from foley. Still having hematuria with some small clots.  Reassessed. Urine pink tinged without clots. Foley draining freely. He has only mild pain at foley insertion site. Given Hgb drop will repeat in a few  hours to make sure no significant further drop and touch base with Urology again for dispo.  Urine initially significantly improved, turned blood-tinged after some manual irrigation.  Recheck hemoglobin with additional one-point drop for total of 3 points in 48 hours.  He was able to ambulate some without any syncope.  Went to reassess patient, urine is now darker in color and he is having some more clots.  CONSULT with Will, Urology resident who will see patient at bedside  Patient assess by Urology resident at bedside. No further clotting, Patient can dc home and FU in  office early next week. Does not feel patient is actively bleeding. Will dc home with Oxybutin for pain.   Discussed rec from Urology with patient and wife in room.  He is ambulatory without any lightheadedness or dizziness.  Discussed Admission to medicine for observation and trend Hgb however patient states he would like to go home. Given strict return precautions.  The patient has been appropriately medically screened and/or stabilized in the ED. I have low suspicion for any other emergent medical condition which would require further screening, evaluation or treatment in the ED or require inpatient management.  Patient is hemodynamically stable and in no acute distress.  Patient able to ambulate in department prior to ED.  Evaluation does not show acute pathology that would require ongoing or additional emergent interventions while in the emergency department or further inpatient treatment.  I have discussed the diagnosis with the patient and answered all questions.  Pain is been managed while in the emergency department and patient has no further complaints prior to discharge.  Patient is comfortable with plan discussed in room and is stable for discharge at this time.  I have discussed strict return precautions for returning to the emergency department.  Patient was encouraged to follow-up with PCP/specialist refer to at  discharge.      MDM Rules/Calculators/A&P                            Final Clinical Impression(s) / ED Diagnoses Final diagnoses:  Gross hematuria  Chronic anticoagulation  Anemia, unspecified type    Rx / DC Orders ED Discharge Orders          Ordered    oxybutynin (DITROPAN) 5 MG tablet  3 times daily PRN        07/19/21 2121             Shacoria Latif A, PA-C 07/19/21 2133    Godfrey Pick, MD 07/21/21 1512

## 2021-07-19 NOTE — ED Triage Notes (Addendum)
BIB [facility] from home for urinary retention, bladder spasm/pain, hematuria, blood from urethra, prostate surgery by [facility] on Thursday, 3d ago. Restless/ writhing. Verbalizes triggered by having a bowel movement and straining. No relief with tylenol PTA.

## 2021-07-19 NOTE — Consult Note (Signed)
Urology Consult Note   Requesting Attending Physician:  Godfrey Pick, MD Service Providing Consult: Urology  Consulting Attending: Dr. Raynelle Bring   Reason for Consult:  Urinary retention, gross hematuria  HPI: Ryan [name] is seen in consultation for reasons noted above at the request of Godfrey Pick, MD for evaluation of urinary retention and gross hematuria.  This is a 75 y.o. male with Hx of BPH s/p TURP on 10/13. He was doing well at home, voiding spontaneously, until he needed to have a big bowel movement where he strained considerably. After that he felt like he could not void well, only dribbles, and he had new onset hematuria. He stated that he passed some clots as well.  In the ED, he was found to be in retention and had a catheter placed. This was flushed with return of small clots but was otherwise cleared.  He denies fevers, chills, chest pain, SOB, dizziness. He does state that he gets bladder spasms with the catheter.    Past Medical History: Past Medical History:  Diagnosis Date   Anemia    Anticoagulant long-term use    changed from eliquis to praxada 06/ 2022--- mananged by cardiology   BPH with urinary obstruction    CAD (coronary artery disease)    cardiologist--- dr g. taylor--- cath 04-22-2011  moderate LM stenosis, borderline sig pRCA, sig stenosis mid to distal LAD ;  aggressive medical therapy   Chronic diastolic CHF (congestive heart failure) ([place]) 03/2018   followed by cardiology   CKD (chronic kidney disease), stage II    COPD (chronic obstructive pulmonary disease) ([place])    followed by pcp   DOE (dyspnea on exertion)    per pt when walk >100yds,  per pt rides outside bike 4-8 miles daily without sob,  household chores and yard work without sob   Eczema    GERD (gastroesophageal reflux disease)    Glaucoma, left eye    Heart murmur    Hemorrhoids    History of adenomatous polyp of colon    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 Bryan Lemma--- dx by biopsy 01-28-2021   Macular degeneration of both eyes    Osteoporosis 05/31/2016   PAF (paroxysmal atrial fibrillation) ([place]) 03/2012   cardiologist-- dr g. taylor;  first dx 06/ 2013 AFlutter w/ RVR   Urethral lesion    Vitamin D deficiency     Past Surgical History:  Past Surgical History:  Procedure Laterality Date   ANTERIOR CERVICAL DECOMP/DISCECTOMY FUSION  06/08/2002   @[facility] ;  C5--C7   [place]  04/22/2011   moderate left main and RCA stenosis not significant by FFR and IVUS on medical therapy   CARDIOVERSION N/A 04/25/2018   Procedure:  CARDIOVERSION;  Surgeon: Skeet Latch, MD;  Location: [facility];  Service: Cardiovascular;  Laterality: N/A;   CATARACT EXTRACTION W/ INTRAOCULAR LENS IMPLANT Bilateral 2021   COLONOSCOPY WITH ESOPHAGOGASTRODUODENOSCOPY (EGD)  01/28/2021   by [facility] LITHOTRIPSY     x2  1990s   FOOT SURGERY Right 1970   calcification removed from top of foot   KNEE ARTHROSCOPY Right 1991   LAPAROSCOPIC CHOLECYSTECTOMY  2000   TOTAL KNEE ARTHROPLASTY Right 08/14/2009   @[facility]    TRANSURETHRAL RESECTION OF PROSTATE N/A 07/17/2021   Procedure: TRANSURETHRAL RESECTION OF THE PROSTATE (TURP);  Surgeon: [name], MD;  Location: [facility];  Service: Urology;  Laterality: N/A;  1 HR     Medication: No current facility-administered medications for this encounter.   Current Outpatient Medications  Medication Sig Dispense Refill   amoxicillin (AMOXIL) 500 MG capsule Take 1 capsule (500 mg total) by mouth every 12 (twelve) hours. 6 capsule 0   brimonidine (ALPHAGAN) 0.2 % ophthalmic solution Place 1 drop into the left eye in the morning and at bedtime.   7   budesonide (ENTOCORT EC) 3 MG 24 hr capsule Take 2 capsules (6 mg total) by mouth daily for 42 days, THEN 1 capsule (3 mg total) daily. (Patient taking differently: Take 2 capsules (6 mg total) by mouth daily for 42 days, THEN 1 capsule (3 mg total) daily. 07-15-2021 Per pt currently take 2 tabs in am) 160 capsule 0   furosemide (LASIX) 20 MG tablet Take 1 tablet (20 mg total) by mouth daily as needed for edema (weight gain>3#/24 hours). (Patient taking differently: Take 20 mg by mouth daily as needed for edema (weight gain>3#/24 hours).) 90 tablet 1   latanoprost (XALATAN) 0.005 % ophthalmic solution Place 1 drop into the left eye at bedtime.      levalbuterol (XOPENEX HFA) 45 MCG/ACT inhaler Inhale 2 puffs into the lungs every 4 (four) hours as needed for wheezing. 45 g 2   MAGnesium-Oxide 400 (240 Mg) MG tablet TAKE 1 TABLET(400 MG) BY MOUTH THREE TIMES DAILY AS NEEDED (Patient taking differently: Take 400 mg by mouth 2 (two) times daily.) 270 tablet 1   metoprolol tartrate (LOPRESSOR) 25 MG tablet TAKE 1 TABLET BY MOUTH  TWICE DAILY (Patient taking differently: Take 25 mg by mouth 2 (two) times daily.) 180 tablet 3   nitroGLYCERIN (NITROSTAT) 0.4 MG SL tablet Place 1 tablet (0.4 mg total) under the tongue every 5 (five) minutes as needed for chest pain. 30 tablet 0   omeprazole (PRILOSEC OTC) 20 MG tablet Take 20 mg by mouth daily.     potassium chloride (KLOR-CON) 10 MEQ tablet TAKE 1 TABLET BY MOUTH  DAILY (Patient taking differently: Take 10 mEq by mouth daily.) 90 tablet 3   propafenone (RYTHMOL) 225 MG tablet TAKE 1  TABLET BY MOUTH  TWICE DAILY (Patient taking differently: Take 225 mg by mouth 2 (two) times daily.) 180 tablet 2   Ranibizumab (LUCENTIS IO) Inject 1 Dose into the eye once as needed (Macular degeneration).     tamsulosin (FLOMAX) 0.4 MG CAPS capsule Take 0.4 mg by mouth 2 (two) times daily.     Cholecalciferol (VITAMIN D3) 5000 units CAPS Take 10,000 Units by mouth every other day.  (Patient not taking: Reported on 07/19/2021)     clobetasol (TEMOVATE) 0.05 % external solution APPLY 1 APPLICATION TOPICALLY TWICE DAILY (Patient not taking: No sig reported) 100 mL 3   dabigatran (PRADAXA) 150  MG CAPS capsule Take 1 capsule (150 mg total) by mouth 2 (two) times daily. (Patient not taking: No sig reported) 180 capsule 3   Multiple Vitamins-Minerals (ICAPS AREDS 2 PO) Take 1 capsule by mouth 2 (two) times daily. (Patient not taking: Reported on 07/19/2021)      Allergies: Allergies  Allergen Reactions   Albumin (Human) Anaphylaxis   Polymyxin B-Trimethoprim Swelling    Eye drops made eyes swell   Pseudoephedrine Other (See Comments)    Stomach cramps   Codeine Hives, Itching and Rash   Guaiacol Other (See Comments)    Hallucinations   Statins Other (See Comments)    Muscle cramps    Gabapentin Other (See Comments)    Pt unsure of sensitivity   Meloxicam Other (See Comments)    Pt unsure of sensitivity   Pseudoephedrine-Guaifenesin Nausea And Vomiting    Stomach cramps   Rosuvastatin Calcium Other (See Comments)    Muscle aches   Tapentadol Other (See Comments)    Pt unsure of sensitivity   Ciprofloxacin Hives, Itching, Nausea Only and Rash   Moxifloxacin Nausea Only and Other (See Comments)    Headaches, stomach cramps    Rofecoxib Other (See Comments)    Stomach cramping    Social History: Social History   Tobacco Use   Smoking status: Former    Packs/day: 0.50    Years: 50.00    Pack years: 25.00    Types: Cigarettes    Quit date: 05/01/2009    Years since quitting:  12.2   Smokeless tobacco: Former    Types: Chew    Quit date: 1995  Vaping Use   Vaping Use: Never used  Substance Use Topics   Alcohol use: Yes    Alcohol/week: 1.0 standard drink    Types: 1 Cans of beer per week    Comment: 1 beer daily   Drug use: Never    Family History Family History  Problem Relation Age of Onset   Heart disease Mother    Diabetes Mother    Cirrhosis Mother    Emphysema Mother        never smoked but 2nd hand through her spouse   Hypertension Mother    Macular degeneration Mother    Heart disease Father    Cancer Father        prostate   Hyperlipidemia Father    Hypertension Father    Varicose Veins Father    Heart attack Father    Peripheral vascular disease Father    Heart disease Sister    Arthritis Sister    Hyperlipidemia Sister    Obesity Sister    Macular degeneration Sister    Heart disease Brother        5 stents   Hyperlipidemia Brother    Macular degeneration Maternal Grandfather    Cirrhosis Sister    Obesity Sister    Arthritis Sister    Heart disease Sister    Obesity Sister    Liver disease Other    Prostate cancer Other    Coronary artery disease Other    Colon cancer Neg Hx    Esophageal cancer Neg Hx    Rectal cancer Neg Hx    Stomach cancer Neg Hx     Review of Systems 10 systems were reviewed and are negative except as noted specifically in the HPI.  Objective   Vital signs in last 24 hours: BP (!) 149/68   Pulse (!) 55   Temp  97.9 F (36.6 C) (Oral)   Resp 20   SpO2 97%   Physical Exam General: NAD, A&O, resting, appropriate HEENT: [place]/AT, EOMI, MMM Pulmonary: Normal work of breathing Cardiovascular: HDS, adequate peripheral perfusion Abdomen: Soft, NTTP, nondistended. GU: Foley catheter in place draining clear pink urine with minimal clot debris Extremities: warm and well perfused Neuro: Appropriate, no focal neurological deficits  Most Recent Labs: Lab Results  Component Value Date   WBC  12.4 (H) 07/19/2021   HGB 9.9 (L) 07/19/2021   HCT 29.8 (L) 07/19/2021   PLT 180 07/19/2021    Lab Results  Component Value Date   NA 133 (L) 07/19/2021   K 4.5 07/19/2021   CL 103 07/19/2021   CO2 21 (L) 07/19/2021   BUN 27 (H) 07/19/2021   CREATININE 1.42 (H) 07/19/2021   CALCIUM 8.8 (L) 07/19/2021   MG 1.6 05/23/2021   PHOS 3.9 03/25/2021    Lab Results  Component Value Date   INR 1.3 (H) 03/23/2021   APTT 25 03/23/2021   IMAGING: No results found.  ------  Assessment:  75 y.o. male with Hx BPH s/p recent TURP now w/ AUR and mild hematuria. His catheter is draining well and his hematuria is very mild (light pink) without any significant clots. Hgb 9.9 in the ED; however, I do not believe he is having any ongoing significant blood loss.   Recommendations: - Ok to discharge from Urology perspective - Continue Foley catheter. Will set up clinic appointment for TOV first thing next week. - Please order oxybutynin 5 mg TID PRN for the patient for bladder spasms. Common side effects discussed.  - Return precautions including non-draining catheter, worsening hematuria with passage of large clots, fevers, or light-headedness/dizziness were discussed.    Thank you for this consult. Please contact the urology consult pager with any further questions/concerns.  Reola Mosher, MD [facility] Urology Resident, [facility]

## 2021-07-20 ENCOUNTER — Encounter (HOSPITAL_COMMUNITY): Payer: Self-pay | Admitting: Emergency Medicine

## 2021-07-20 ENCOUNTER — Inpatient Hospital Stay (HOSPITAL_COMMUNITY)
Admission: EM | Admit: 2021-07-20 | Discharge: 2021-08-01 | DRG: 234 | Disposition: A | Discharge status: Home Health | Payer: Medicare Other | Attending: Thoracic Surgery (Cardiothoracic Vascular Surgery) | Admitting: Thoracic Surgery (Cardiothoracic Vascular Surgery)

## 2021-07-20 ENCOUNTER — Emergency Department (HOSPITAL_COMMUNITY): Payer: Medicare Other

## 2021-07-20 ENCOUNTER — Other Ambulatory Visit: Payer: Self-pay

## 2021-07-20 DIAGNOSIS — I249 Acute ischemic heart disease, unspecified: Secondary | ICD-10-CM | POA: Diagnosis not present

## 2021-07-20 DIAGNOSIS — Z8673 Personal history of transient ischemic attack (TIA), and cerebral infarction without residual deficits: Secondary | ICD-10-CM

## 2021-07-20 DIAGNOSIS — I081 Rheumatic disorders of both mitral and tricuspid valves: Secondary | ICD-10-CM | POA: Diagnosis not present

## 2021-07-20 DIAGNOSIS — Z83438 Family history of other disorder of lipoprotein metabolism and other lipidemia: Secondary | ICD-10-CM

## 2021-07-20 DIAGNOSIS — Z9079 Acquired absence of other genital organ(s): Secondary | ICD-10-CM

## 2021-07-20 DIAGNOSIS — Z825 Family history of asthma and other chronic lower respiratory diseases: Secondary | ICD-10-CM

## 2021-07-20 DIAGNOSIS — Z87891 Personal history of nicotine dependence: Secondary | ICD-10-CM | POA: Diagnosis not present

## 2021-07-20 DIAGNOSIS — Z951 Presence of aortocoronary bypass graft: Secondary | ICD-10-CM | POA: Diagnosis not present

## 2021-07-20 DIAGNOSIS — J9811 Atelectasis: Secondary | ICD-10-CM | POA: Diagnosis not present

## 2021-07-20 DIAGNOSIS — Z79899 Other long term (current) drug therapy: Secondary | ICD-10-CM

## 2021-07-20 DIAGNOSIS — R319 Hematuria, unspecified: Secondary | ICD-10-CM

## 2021-07-20 DIAGNOSIS — R42 Dizziness and giddiness: Secondary | ICD-10-CM | POA: Diagnosis not present

## 2021-07-20 DIAGNOSIS — Z885 Allergy status to narcotic agent status: Secondary | ICD-10-CM

## 2021-07-20 DIAGNOSIS — I739 Peripheral vascular disease, unspecified: Secondary | ICD-10-CM | POA: Diagnosis not present

## 2021-07-20 DIAGNOSIS — J449 Chronic obstructive pulmonary disease, unspecified: Secondary | ICD-10-CM | POA: Diagnosis not present

## 2021-07-20 DIAGNOSIS — Z8249 Family history of ischemic heart disease and other diseases of the circulatory system: Secondary | ICD-10-CM | POA: Diagnosis not present

## 2021-07-20 DIAGNOSIS — Z8042 Family history of malignant neoplasm of prostate: Secondary | ICD-10-CM

## 2021-07-20 DIAGNOSIS — Z886 Allergy status to analgesic agent status: Secondary | ICD-10-CM | POA: Diagnosis not present

## 2021-07-20 DIAGNOSIS — R140 Abdominal distension (gaseous): Secondary | ICD-10-CM | POA: Diagnosis not present

## 2021-07-20 DIAGNOSIS — Z8619 Personal history of other infectious and parasitic diseases: Secondary | ICD-10-CM | POA: Diagnosis not present

## 2021-07-20 DIAGNOSIS — I2510 Atherosclerotic heart disease of native coronary artery without angina pectoris: Secondary | ICD-10-CM

## 2021-07-20 DIAGNOSIS — Z09 Encounter for follow-up examination after completed treatment for conditions other than malignant neoplasm: Secondary | ICD-10-CM

## 2021-07-20 DIAGNOSIS — I420 Dilated cardiomyopathy: Secondary | ICD-10-CM

## 2021-07-20 DIAGNOSIS — N401 Enlarged prostate with lower urinary tract symptoms: Secondary | ICD-10-CM | POA: Diagnosis present

## 2021-07-20 DIAGNOSIS — I2584 Coronary atherosclerosis due to calcified coronary lesion: Secondary | ICD-10-CM

## 2021-07-20 DIAGNOSIS — J439 Emphysema, unspecified: Secondary | ICD-10-CM | POA: Diagnosis not present

## 2021-07-20 DIAGNOSIS — I25110 Atherosclerotic heart disease of native coronary artery with unstable angina pectoris: Principal | ICD-10-CM | POA: Diagnosis present

## 2021-07-20 DIAGNOSIS — E782 Mixed hyperlipidemia: Secondary | ICD-10-CM | POA: Diagnosis present

## 2021-07-20 DIAGNOSIS — Z981 Arthrodesis status: Secondary | ICD-10-CM

## 2021-07-20 DIAGNOSIS — K567 Ileus, unspecified: Secondary | ICD-10-CM | POA: Diagnosis not present

## 2021-07-20 DIAGNOSIS — I10 Essential (primary) hypertension: Secondary | ICD-10-CM | POA: Diagnosis not present

## 2021-07-20 DIAGNOSIS — I959 Hypotension, unspecified: Secondary | ICD-10-CM | POA: Diagnosis not present

## 2021-07-20 DIAGNOSIS — I5032 Chronic diastolic (congestive) heart failure: Secondary | ICD-10-CM | POA: Diagnosis not present

## 2021-07-20 DIAGNOSIS — D62 Acute posthemorrhagic anemia: Secondary | ICD-10-CM | POA: Diagnosis present

## 2021-07-20 DIAGNOSIS — Z789 Other specified health status: Secondary | ICD-10-CM

## 2021-07-20 DIAGNOSIS — N390 Urinary tract infection, site not specified: Secondary | ICD-10-CM

## 2021-07-20 DIAGNOSIS — R079 Chest pain, unspecified: Secondary | ICD-10-CM | POA: Diagnosis not present

## 2021-07-20 DIAGNOSIS — R0902 Hypoxemia: Secondary | ICD-10-CM | POA: Diagnosis not present

## 2021-07-20 DIAGNOSIS — E669 Obesity, unspecified: Secondary | ICD-10-CM | POA: Diagnosis not present

## 2021-07-20 DIAGNOSIS — K9189 Other postprocedural complications and disorders of digestive system: Secondary | ICD-10-CM | POA: Diagnosis not present

## 2021-07-20 DIAGNOSIS — G4730 Sleep apnea, unspecified: Secondary | ICD-10-CM | POA: Diagnosis present

## 2021-07-20 DIAGNOSIS — M810 Age-related osteoporosis without current pathological fracture: Secondary | ICD-10-CM | POA: Diagnosis present

## 2021-07-20 DIAGNOSIS — D649 Anemia, unspecified: Secondary | ICD-10-CM | POA: Diagnosis not present

## 2021-07-20 DIAGNOSIS — H409 Unspecified glaucoma: Secondary | ICD-10-CM | POA: Diagnosis present

## 2021-07-20 DIAGNOSIS — Z888 Allergy status to other drugs, medicaments and biological substances status: Secondary | ICD-10-CM

## 2021-07-20 DIAGNOSIS — H3530 Unspecified macular degeneration: Secondary | ICD-10-CM | POA: Diagnosis present

## 2021-07-20 DIAGNOSIS — I129 Hypertensive chronic kidney disease with stage 1 through stage 4 chronic kidney disease, or unspecified chronic kidney disease: Secondary | ICD-10-CM | POA: Diagnosis not present

## 2021-07-20 DIAGNOSIS — N3289 Other specified disorders of bladder: Secondary | ICD-10-CM | POA: Diagnosis present

## 2021-07-20 DIAGNOSIS — Z6830 Body mass index (BMI) 30.0-30.9, adult: Secondary | ICD-10-CM | POA: Diagnosis not present

## 2021-07-20 DIAGNOSIS — R509 Fever, unspecified: Secondary | ICD-10-CM | POA: Diagnosis not present

## 2021-07-20 DIAGNOSIS — K3189 Other diseases of stomach and duodenum: Secondary | ICD-10-CM

## 2021-07-20 DIAGNOSIS — Z881 Allergy status to other antibiotic agents status: Secondary | ICD-10-CM

## 2021-07-20 DIAGNOSIS — I130 Hypertensive heart and chronic kidney disease with heart failure and stage 1 through stage 4 chronic kidney disease, or unspecified chronic kidney disease: Secondary | ICD-10-CM | POA: Diagnosis present

## 2021-07-20 DIAGNOSIS — R066 Hiccough: Secondary | ICD-10-CM | POA: Diagnosis not present

## 2021-07-20 DIAGNOSIS — N179 Acute kidney failure, unspecified: Secondary | ICD-10-CM | POA: Diagnosis not present

## 2021-07-20 DIAGNOSIS — I517 Cardiomegaly: Secondary | ICD-10-CM | POA: Diagnosis not present

## 2021-07-20 DIAGNOSIS — T83511D Infection and inflammatory reaction due to indwelling urethral catheter, subsequent encounter: Secondary | ICD-10-CM | POA: Diagnosis not present

## 2021-07-20 DIAGNOSIS — Z20822 Contact with and (suspected) exposure to covid-19: Secondary | ICD-10-CM | POA: Diagnosis not present

## 2021-07-20 DIAGNOSIS — Z7902 Long term (current) use of antithrombotics/antiplatelets: Secondary | ICD-10-CM

## 2021-07-20 DIAGNOSIS — N1831 Chronic kidney disease, stage 3a: Secondary | ICD-10-CM | POA: Diagnosis not present

## 2021-07-20 DIAGNOSIS — K219 Gastro-esophageal reflux disease without esophagitis: Secondary | ICD-10-CM | POA: Diagnosis present

## 2021-07-20 DIAGNOSIS — K6389 Other specified diseases of intestine: Secondary | ICD-10-CM | POA: Diagnosis not present

## 2021-07-20 DIAGNOSIS — R310 Gross hematuria: Secondary | ICD-10-CM | POA: Diagnosis not present

## 2021-07-20 DIAGNOSIS — J90 Pleural effusion, not elsewhere classified: Secondary | ICD-10-CM | POA: Diagnosis not present

## 2021-07-20 DIAGNOSIS — Z96651 Presence of right artificial knee joint: Secondary | ICD-10-CM | POA: Diagnosis present

## 2021-07-20 DIAGNOSIS — T83511A Infection and inflammatory reaction due to indwelling urethral catheter, initial encounter: Secondary | ICD-10-CM | POA: Diagnosis not present

## 2021-07-20 DIAGNOSIS — I480 Paroxysmal atrial fibrillation: Secondary | ICD-10-CM

## 2021-07-20 DIAGNOSIS — Z4682 Encounter for fitting and adjustment of non-vascular catheter: Secondary | ICD-10-CM | POA: Diagnosis not present

## 2021-07-20 DIAGNOSIS — Z01810 Encounter for preprocedural cardiovascular examination: Secondary | ICD-10-CM | POA: Diagnosis not present

## 2021-07-20 DIAGNOSIS — I700 Atherosclerosis of aorta: Secondary | ICD-10-CM | POA: Diagnosis not present

## 2021-07-20 DIAGNOSIS — I454 Nonspecific intraventricular block: Secondary | ICD-10-CM | POA: Diagnosis present

## 2021-07-20 DIAGNOSIS — Z7901 Long term (current) use of anticoagulants: Secondary | ICD-10-CM | POA: Diagnosis not present

## 2021-07-20 DIAGNOSIS — R5383 Other fatigue: Secondary | ICD-10-CM | POA: Diagnosis not present

## 2021-07-20 DIAGNOSIS — N182 Chronic kidney disease, stage 2 (mild): Secondary | ICD-10-CM | POA: Diagnosis not present

## 2021-07-20 DIAGNOSIS — N189 Chronic kidney disease, unspecified: Secondary | ICD-10-CM | POA: Diagnosis not present

## 2021-07-20 LAB — PROTIME-INR
INR: 1.1 (ref 0.8–1.2)
Prothrombin Time: 14 seconds (ref 11.4–15.2)

## 2021-07-20 LAB — CBC WITH DIFFERENTIAL/PLATELET
Abs Immature Granulocytes: 0.06 10*3/uL (ref 0.00–0.07)
Abs Immature Granulocytes: 0.07 10*3/uL (ref 0.00–0.07)
Basophils Absolute: 0 10*3/uL (ref 0.0–0.1)
Basophils Absolute: 0 10*3/uL (ref 0.0–0.1)
Basophils Relative: 0 %
Basophils Relative: 0 %
Eosinophils Absolute: 0.2 10*3/uL (ref 0.0–0.5)
Eosinophils Absolute: 0.2 10*3/uL (ref 0.0–0.5)
Eosinophils Relative: 2 %
Eosinophils Relative: 2 %
HCT: 33.7 % — ABNORMAL LOW (ref 39.0–52.0)
HCT: 30.4 % — ABNORMAL LOW (ref 39.0–52.0)
Hemoglobin: 11 g/dL — ABNORMAL LOW (ref 13.0–17.0)
Hemoglobin: 10.3 g/dL — ABNORMAL LOW (ref 13.0–17.0)
Immature Granulocytes: 1 %
Immature Granulocytes: 1 %
Lymphocytes Relative: 19 %
Lymphocytes Relative: 27 %
Lymphs Abs: 1.7 10*3/uL (ref 0.7–4.0)
Lymphs Abs: 2.3 10*3/uL (ref 0.7–4.0)
MCH: 30.6 pg (ref 26.0–34.0)
MCH: 31.8 pg (ref 26.0–34.0)
MCHC: 32.6 g/dL (ref 30.0–36.0)
MCHC: 33.9 g/dL (ref 30.0–36.0)
MCV: 93.9 fL (ref 80.0–100.0)
MCV: 93.8 fL (ref 80.0–100.0)
Monocytes Absolute: 0.8 10*3/uL (ref 0.1–1.0)
Monocytes Absolute: 0.8 10*3/uL (ref 0.1–1.0)
Monocytes Relative: 9 %
Monocytes Relative: 10 %
Neutro Abs: 6.5 10*3/uL (ref 1.7–7.7)
Neutro Abs: 5.1 10*3/uL (ref 1.7–7.7)
Neutrophils Relative %: 69 %
Neutrophils Relative %: 60 %
Platelets: 197 10*3/uL (ref 150–400)
Platelets: 169 10*3/uL (ref 150–400)
RBC: 3.59 MIL/uL — ABNORMAL LOW (ref 4.22–5.81)
RBC: 3.24 MIL/uL — ABNORMAL LOW (ref 4.22–5.81)
RDW: 12.6 % (ref 11.5–15.5)
RDW: 12.6 % (ref 11.5–15.5)
WBC: 9.3 10*3/uL (ref 4.0–10.5)
WBC: 8.5 10*3/uL (ref 4.0–10.5)
nRBC: 0 % (ref 0.0–0.2)
nRBC: 0 % (ref 0.0–0.2)

## 2021-07-20 LAB — RENAL FUNCTION PANEL
Albumin: 3.1 g/dL — ABNORMAL LOW (ref 3.5–5.0)
Anion gap: 6 (ref 5–15)
BUN: 18 mg/dL (ref 8–23)
CO2: 25 mmol/L (ref 22–32)
Calcium: 8.7 mg/dL — ABNORMAL LOW (ref 8.9–10.3)
Chloride: 106 mmol/L (ref 98–111)
Creatinine, Ser: 1.17 mg/dL (ref 0.61–1.24)
GFR, Estimated: >60 mL/min (ref >60)
Glucose, Bld: 95 mg/dL (ref 70–99)
Phosphorus: 3 mg/dL (ref 2.5–4.6)
Potassium: 4 mmol/L (ref 3.5–5.1)
Sodium: 137 mmol/L (ref 135–145)

## 2021-07-20 LAB — URINALYSIS, ROUTINE W REFLEX MICROSCOPIC: RBC / HPF: >50 RBC/hpf — ABNORMAL HIGH (ref 0–5)

## 2021-07-20 LAB — HEMOGLOBIN AND HEMATOCRIT, BLOOD
HCT: 31.2 % — ABNORMAL LOW (ref 39.0–52.0)
Hemoglobin: 10.3 g/dL — ABNORMAL LOW (ref 13.0–17.0)

## 2021-07-20 LAB — BASIC METABOLIC PANEL
Anion gap: 8 (ref 5–15)
BUN: 22 mg/dL (ref 8–23)
CO2: 23 mmol/L (ref 22–32)
Calcium: 9.1 mg/dL (ref 8.9–10.3)
Chloride: 104 mmol/L (ref 98–111)
Creatinine, Ser: 1.2 mg/dL (ref 0.61–1.24)
GFR, Estimated: >60 mL/min (ref >60)
Glucose, Bld: 115 mg/dL — ABNORMAL HIGH (ref 70–99)
Potassium: 4 mmol/L (ref 3.5–5.1)
Sodium: 135 mmol/L (ref 135–145)

## 2021-07-20 LAB — RESP PANEL BY RT-PCR (FLU A&B, COVID) ARPGX2
Influenza A by PCR: NEGATIVE
Influenza B by PCR: NEGATIVE
SARS Coronavirus 2 by RT PCR: NEGATIVE

## 2021-07-20 LAB — TROPONIN I (HIGH SENSITIVITY)
Troponin I (High Sensitivity): 27 ng/L — ABNORMAL HIGH (ref <18)
Troponin I (High Sensitivity): 19 ng/L — ABNORMAL HIGH (ref <18)

## 2021-07-20 LAB — LACTIC ACID, PLASMA
Lactic Acid, Venous: 2.3 mmol/L (ref 0.5–1.9)
Lactic Acid, Venous: 0.9 mmol/L (ref 0.5–1.9)

## 2021-07-20 LAB — APTT: aPTT: 28 seconds (ref 24–36)

## 2021-07-20 MED ORDER — ACETAMINOPHEN 650 MG RE SUPP: 650.0000 mg | Freq: Four times a day (QID) | RECTAL | Status: DC | PRN

## 2021-07-20 MED ORDER — FENTANYL CITRATE PF 50 MCG/ML IJ SOSY
25.0000 ug | PREFILLED_SYRINGE | INTRAMUSCULAR | Status: DC | PRN
  Administered 2021-07-20: 25 ug via INTRAVENOUS
  Filled 2021-07-20: qty 1

## 2021-07-20 MED ORDER — TAMSULOSIN HCL 0.4 MG PO CAPS
0.4000 mg | ORAL_CAPSULE | Freq: Two times a day (BID) | ORAL | Status: DC
  Administered 2021-07-20 – 2021-08-01 (×22): 0.4 mg via ORAL
  Filled 2021-07-20 (×23): qty 1

## 2021-07-20 MED ORDER — ONDANSETRON HCL 4 MG/2ML IJ SOLN: 4.0000 mg | Freq: Four times a day (QID) | INTRAMUSCULAR | Status: DC | PRN

## 2021-07-20 MED ORDER — LACTATED RINGERS IV BOLUS (SEPSIS)
1000.0000 mL | Freq: Once | INTRAVENOUS | Status: AC
  Administered 2021-07-20: 1000 mL via INTRAVENOUS

## 2021-07-20 MED ORDER — LACTATED RINGERS IV SOLN: INTRAVENOUS | Status: DC

## 2021-07-20 MED ORDER — ACETAMINOPHEN 325 MG PO TABS: 650.0000 mg | ORAL_TABLET | Freq: Four times a day (QID) | ORAL | Status: DC | PRN

## 2021-07-20 MED ORDER — SODIUM CHLORIDE 0.9 % IV SOLN
2.0000 g | Freq: Once | INTRAVENOUS | Status: AC
  Administered 2021-07-20: 2 g via INTRAVENOUS
  Filled 2021-07-20: qty 2

## 2021-07-20 MED ORDER — ONDANSETRON HCL 4 MG/2ML IJ SOLN
4.0000 mg | Freq: Once | INTRAMUSCULAR | Status: AC
  Administered 2021-07-20: 4 mg via INTRAVENOUS
  Filled 2021-07-20: qty 2

## 2021-07-20 MED ORDER — HYDROCODONE-ACETAMINOPHEN 5-325 MG PO TABS
1.0000 | ORAL_TABLET | Freq: Four times a day (QID) | ORAL | Status: DC | PRN
  Administered 2021-07-20: 1 via ORAL
  Filled 2021-07-20: qty 1

## 2021-07-20 MED ORDER — SODIUM CHLORIDE 0.9 % IV SOLN
2.0000 g | Freq: Two times a day (BID) | INTRAVENOUS | Status: DC
  Administered 2021-07-20 – 2021-07-21 (×3): 2 g via INTRAVENOUS
  Filled 2021-07-20 (×3): qty 2

## 2021-07-20 MED ORDER — OXYBUTYNIN CHLORIDE 5 MG PO TABS
5.0000 mg | ORAL_TABLET | Freq: Three times a day (TID) | ORAL | Status: DC | PRN
  Administered 2021-07-20: 5 mg via ORAL
  Filled 2021-07-20 (×2): qty 1

## 2021-07-20 MED ORDER — FENTANYL CITRATE PF 50 MCG/ML IJ SOSY
50.0000 ug | PREFILLED_SYRINGE | Freq: Once | INTRAMUSCULAR | Status: AC
  Administered 2021-07-20: 50 ug via INTRAVENOUS
  Filled 2021-07-20: qty 1

## 2021-07-20 MED ORDER — BUDESONIDE 3 MG PO CPEP
6.0000 mg | ORAL_CAPSULE | Freq: Every day | ORAL | Status: DC
  Administered 2021-07-21 – 2021-08-01 (×11): 6 mg via ORAL
  Filled 2021-07-20 (×12): qty 2

## 2021-07-20 MED ORDER — ONDANSETRON HCL 4 MG PO TABS: 4.0000 mg | ORAL_TABLET | Freq: Four times a day (QID) | ORAL | Status: DC | PRN

## 2021-07-20 MED ORDER — PROPAFENONE HCL 225 MG PO TABS
225.0000 mg | ORAL_TABLET | Freq: Two times a day (BID) | ORAL | Status: DC
  Administered 2021-07-20 – 2021-08-01 (×22): 225 mg via ORAL
  Filled 2021-07-20 (×26): qty 1

## 2021-07-20 MED ORDER — METOPROLOL TARTRATE 25 MG PO TABS: 25.0000 mg | ORAL_TABLET | Freq: Two times a day (BID) | ORAL | Status: DC

## 2021-07-20 MED ORDER — BRIMONIDINE TARTRATE 0.2 % OP SOLN
1.0000 [drp] | Freq: Two times a day (BID) | OPHTHALMIC | Status: DC
  Administered 2021-07-20 – 2021-07-21 (×2): 1 [drp] via OPHTHALMIC
  Filled 2021-07-20: qty 5

## 2021-07-20 MED ORDER — LATANOPROST 0.005 % OP SOLN
1.0000 [drp] | Freq: Every day | OPHTHALMIC | Status: DC
  Administered 2021-07-20: 1 [drp] via OPHTHALMIC
  Filled 2021-07-20: qty 2.5

## 2021-07-20 MED ORDER — LACTATED RINGERS IV BOLUS (SEPSIS)
500.0000 mL | Freq: Once | INTRAVENOUS | Status: AC
  Administered 2021-07-20: 500 mL via INTRAVENOUS

## 2021-07-20 MED ORDER — LEVALBUTEROL HCL 1.25 MG/0.5ML IN NEBU: 2.5000 mg | INHALATION_SOLUTION | RESPIRATORY_TRACT | Status: DC | PRN

## 2021-07-20 NOTE — Progress Notes (Signed)
A consult was received from an ED provider for Cefepime per pharmacy dosing.  The patient's profile has been reviewed for ht/wt/allergies/indication/available labs.    A one time order has been placed for Cefepime 2g IV.  Further antibiotics/pharmacy consults should be ordered by admitting physician if indicated.                       Thank you, Luiz Ochoa 07/20/2021  11:27 AM

## 2021-07-20 NOTE — H&P (Signed)
History and Physical    Ryan [name]:[id number] DOB: 19-Oct-1945 DOA: 07/20/2021  PCP: [name], MD  Patient coming from: Home  Chief Complaint: bladder pain, weakness  HPI: Ryan [name] is a 75 y.o. male with medical history significant of a fib on pradaxa, CAD, BPH s/p TURP w/ chronic foley. Presenting with bladder pain and weakness. He had a TURP on 10/13. He was able to discharge to home on 10/14. That evening he started having bladder spasms and penile pain. He returned to the ED on 10/15 and was seen by urology. A foley was placed and his symptoms seemed to improve. He was able to go home last night. However, he woke up this morning feeling weak and dizzy. He had chills and felt feverish. He also had a return of his abdominal pain. He became concerned and returned to the ED for help.    ED Course: Lactic acid was elevated to 2.3. UA was unable to be interpreted d/t hematuria. He was started on cefepime. Urology was consulted. [facility] was called for admission.   Review of Systems:  Denies dyspnea, palpitations, N/V/D,  sick contacts, syncopal episodes. Reports chest pain, ab pain, bladder pain. Review of systems is otherwise negative for all not mentioned in HPI.   PMHx Past Medical History:  Diagnosis Date   Anemia    Anticoagulant long-term use    changed from eliquis to praxada 06/ 2022--- mananged by cardiology   BPH with urinary obstruction    CAD (coronary artery disease)    cardiologist--- dr g. taylor--- cath 04-22-2011  moderate LM stenosis, borderline sig pRCA, sig stenosis mid to distal LAD ;  aggressive medical therapy   Chronic diastolic CHF (congestive heart failure) ([place]) 03/2018   followed by cardiology   CKD (chronic kidney disease), stage II    COPD (chronic obstructive pulmonary disease) ([place])    followed by pcp   DOE (dyspnea on exertion)    per pt when walk >100yds,  per pt rides outside bike 4-8 miles daily without sob,  household chores and yard  work without sob   Eczema    GERD (gastroesophageal reflux disease)    Glaucoma, left eye    Heart murmur    Hemorrhoids    History of adenomatous polyp of colon    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 Bryan Lemma--- dx by biopsy 01-28-2021   Macular degeneration of both eyes    Osteoporosis 05/31/2016   PAF (paroxysmal atrial fibrillation) ([place]) 03/2012   cardiologist-- dr g. taylor;  first dx 06/ 2013 AFlutter w/ RVR   Urethral lesion    Vitamin D deficiency     PSHx Past Surgical History:  Procedure Laterality Date   ANTERIOR CERVICAL DECOMP/DISCECTOMY FUSION  06/08/2002   @  [facility];  C5--C7   APPENDECTOMY  1953   CARDIAC CATHETERIZATION  04/22/2011   moderate left main and RCA stenosis not significant by FFR and IVUS on medical therapy   CARDIOVERSION N/A 04/25/2018   Procedure: CARDIOVERSION;  Surgeon: Skeet Latch, MD;  Location: [facility];  Service: Cardiovascular;  Laterality: N/A;   CATARACT EXTRACTION W/ INTRAOCULAR LENS IMPLANT Bilateral 2021   COLONOSCOPY WITH ESOPHAGOGASTRODUODENOSCOPY (EGD)  01/28/2021   by Byron LITHOTRIPSY     x2  1990s   FOOT SURGERY Right 1970   calcification removed from top of foot   KNEE ARTHROSCOPY Right 1991   LAPAROSCOPIC CHOLECYSTECTOMY  2000   TOTAL KNEE ARTHROPLASTY Right 08/14/2009   @[facility]    TRANSURETHRAL RESECTION OF  PROSTATE N/A 07/17/2021   Procedure: TRANSURETHRAL RESECTION OF THE PROSTATE (TURP);  Surgeon: [name], MD;  Location: [facility];  Service: Urology;  Laterality: N/A;  1 HR    SocHx  reports that he quit smoking about 12 years ago. His smoking use included cigarettes. He has a 25.00 pack-year smoking history. He quit smokeless tobacco use about 27 years ago.  His smokeless tobacco use included chew. He reports current alcohol use of about 1.0 standard drink per week. He reports that he does not use drugs.  Allergies  Allergen Reactions   Albumin (Human) Anaphylaxis   Polymyxin B-Trimethoprim Swelling    Eye drops made eyes swell   Pseudoephedrine Other (See Comments)    Stomach cramps   Codeine Hives, Itching and Rash   Guaiacol Other (See Comments)    Hallucinations   Statins Other (See Comments)    Muscle cramps    Gabapentin Other (See Comments)    Pt unsure of sensitivity   Meloxicam Other (See Comments)    Pt unsure of sensitivity   Pseudoephedrine-Guaifenesin Nausea And Vomiting    Stomach cramps   Rosuvastatin Calcium Other (See Comments)    Muscle aches   Tapentadol Other (See Comments)    Pt unsure of sensitivity   Ciprofloxacin Hives, Itching, Nausea Only and Rash   Moxifloxacin Nausea Only and Other (See Comments)    Headaches, stomach cramps    Rofecoxib Other (See Comments)    Stomach cramping    FamHx Family History  Problem Relation Age of Onset   Heart disease Mother    Diabetes Mother    Cirrhosis Mother    Emphysema Mother        never smoked but 2nd hand through her spouse   Hypertension Mother    Macular degeneration Mother    Heart disease Father    Cancer Father        prostate   Hyperlipidemia Father    Hypertension Father    Varicose Veins Father    Heart attack Father    Peripheral vascular disease Father    Heart disease Sister    Arthritis Sister    Hyperlipidemia Sister    Obesity Sister    Macular  degeneration Sister    Heart disease Brother        5 stents   Hyperlipidemia Brother    Macular degeneration Maternal Grandfather    Cirrhosis Sister    Obesity Sister    Arthritis Sister    Heart disease Sister    Obesity Sister    Liver disease Other    Prostate cancer Other    Coronary artery disease Other    Colon cancer Neg Hx  Esophageal cancer Neg Hx    Rectal cancer Neg Hx    Stomach cancer Neg Hx     Prior to Admission medications   Medication Sig Start Date End Date Taking? Authorizing Provider  amoxicillin (AMOXIL) 500 MG capsule Take 1 capsule (500 mg total) by mouth every 12 (twelve) hours. 07/18/21  Yes Dahlstedt, Annie Main, MD  brimonidine (ALPHAGAN) 0.2 % ophthalmic solution Place 1 drop into the left eye in the morning and at bedtime.  09/10/17  Yes [provider]  budesonide (ENTOCORT EC) 3 MG 24 hr capsule Take 2 capsules (6 mg total) by mouth daily for 42 days, THEN 1 capsule (3 mg total) daily. Patient taking differently: Take 2 capsules (6 mg total) by mouth daily for 42 days, THEN 1 capsule (3 mg total) daily. 07-15-2021 Per pt currently take 2 tabs in am 06/19/21 09/29/21 Yes Cirigliano, Vito V, DO  furosemide (LASIX) 20 MG tablet Take 1 tablet (20 mg total) by mouth daily as needed for edema (weight gain>3#/24 hours). Patient taking differently: Take 20 mg by mouth daily as needed for edema (weight gain>3#/24 hours). 05/16/21  Yes [name], MD  latanoprost (XALATAN) 0.005 % ophthalmic solution Place 1 drop into the left eye at bedtime.    Yes [provider]  levalbuterol (XOPENEX HFA) 45 MCG/ACT inhaler Inhale 2 puffs into the lungs every 4 (four) hours as needed for wheezing. 06/05/20 07/20/21 Yes Saguier, Percell Miller, PA-C  MAGnesium-Oxide 400 (240 Mg) MG tablet TAKE 1 TABLET(400 MG) BY MOUTH THREE TIMES DAILY AS NEEDED Patient taking differently: Take 400 mg by mouth 2 (two) times daily. 05/26/21  Yes [name], MD  metoprolol  tartrate (LOPRESSOR) 25 MG tablet TAKE 1 TABLET BY MOUTH  TWICE DAILY Patient taking differently: Take 25 mg by mouth 2 (two) times daily. 06/11/21  Yes Evans Lance, MD  nitroGLYCERIN (NITROSTAT) 0.4 MG SL tablet Place 1 tablet (0.4 mg total) under the tongue every 5 (five) minutes as needed for chest pain. 04/14/20  Yes Allie Bossier, MD  omeprazole (PRILOSEC OTC) 20 MG tablet Take 20 mg by mouth daily.   Yes [provider]  potassium chloride (KLOR-CON) 10 MEQ tablet TAKE 1 TABLET BY MOUTH  DAILY Patient taking differently: Take 10 mEq by mouth daily. 06/10/21  Yes Evans Lance, MD  propafenone (RYTHMOL) 225 MG tablet TAKE 1 TABLET BY MOUTH  TWICE DAILY Patient taking differently: Take 225 mg by mouth 2 (two) times daily. 11/12/20  Yes Evans Lance, MD  Ranibizumab (LUCENTIS IO) Inject 1 Dose into the eye once as needed (Macular degeneration).   Yes [provider]  tamsulosin (FLOMAX) 0.4 MG CAPS capsule Take 0.4 mg by mouth 2 (two) times daily.   Yes [provider]  clobetasol (TEMOVATE) 0.05 % external solution APPLY 1 APPLICATION TOPICALLY TWICE DAILY Patient not taking: No sig reported 11/20/16   [name], MD  dabigatran (PRADAXA) 150 MG CAPS capsule Take 1 capsule (150 mg total) by mouth 2 (two) times daily. Patient not taking: No sig reported 05/21/21   Evans Lance, MD  oxybutynin (DITROPAN) 5 MG tablet Take 1 tablet (5 mg total) by mouth 3 (three) times daily as needed for up to 7 days for bladder spasms. 07/19/21 07/26/21  Henderly, Britni A, PA-C    Physical Exam: Vitals:   07/20/21 1300 07/20/21 1403 07/20/21 1415 07/20/21 1430  BP: (!) 135/55 (!) 139/57 (!) 178/58 (!) 165/54  Pulse: 68 (!)  50 (!) 51 (!) 57  Resp: 20 16 18 18   Temp:      TempSrc:      SpO2: 91% 96% 98% 93%  Weight:      Height:        General: 75 y.o. male resting in bed in NAD Eyes: PERRL, normal sclera ENMT: Nares patent w/o discharge, orophaynx clear,  dentition normal, ears w/o discharge/lesions/ulcers Neck: Supple, trachea midline Cardiovascular: brady, +S1, S2, no m/g/r, equal pulses throughout Respiratory: CTABL, no w/r/r, normal WOB GI: BS+, ND, RLQ/LLQ TTP, no masses noted, no organomegaly noted GU: foley in place; hematuria noted MSK: No e/c/c Neuro: A&O x 3, no focal deficits Psyc: Appropriate interaction and affect, calm/cooperative  Labs on Admission: I have personally reviewed following labs and imaging studies  CBC: Recent Labs  Lab 07/17/21 0628 07/19/21 1556 07/19/21 1835 07/20/21 1057  WBC  --  12.4*  --  9.3  NEUTROABS  --  8.7*  --  6.5  HGB 12.9* 10.9* 9.9* 11.0*  HCT 38.0* 32.6* 29.8* 33.7*  MCV  --  93.9  --  93.9  PLT  --  180  --  062   Basic Metabolic Panel: Recent Labs  Lab 07/17/21 0628 07/19/21 1556 07/20/21 1057  NA 139 133* 135  K 4.5 4.5 4.0  CL 104 103 104  CO2  --  21* 23  GLUCOSE 93 90 115*  BUN 22 27* 22  CREATININE 1.30* 1.42* 1.20  CALCIUM  --  8.8* 9.1   GFR: Estimated Creatinine Clearance: 51.2 mL/min (by C-G formula based on SCr of 1.2 mg/dL). Liver Function Tests: No results for input(s): AST, ALT, ALKPHOS, BILITOT, PROT, ALBUMIN in the last 168 hours. No results for input(s): LIPASE, AMYLASE in the last 168 hours. No results for input(s): AMMONIA in the last 168 hours. Coagulation Profile: Recent Labs  Lab 07/20/21 1103  INR 1.1   Cardiac Enzymes: No results for input(s): CKTOTAL, CKMB, CKMBINDEX, TROPONINI in the last 168 hours. BNP (last 3 results) Recent Labs    09/09/20 1400  PROBNP 209.0*   HbA1C: No results for input(s): HGBA1C in the last 72 hours. CBG: No results for input(s): GLUCAP in the last 168 hours. Lipid Profile: No results for input(s): CHOL, HDL, LDLCALC, TRIG, CHOLHDL, LDLDIRECT in the last 72 hours. Thyroid Function Tests: No results for input(s): TSH, T4TOTAL, FREET4, T3FREE, THYROIDAB in the last 72 hours. Anemia Panel: No results for  input(s): VITAMINB12, FOLATE, FERRITIN, TIBC, IRON, RETICCTPCT in the last 72 hours. Urine analysis:    Component Value Date/Time   COLORURINE RED (A) 07/20/2021 1231   APPEARANCEUR CLOUDY (A) 07/20/2021 1231   LABSPEC  07/20/2021 1231    TEST NOT REPORTED DUE TO COLOR INTERFERENCE OF URINE PIGMENT   PHURINE  07/20/2021 1231    TEST NOT REPORTED DUE TO COLOR INTERFERENCE OF URINE PIGMENT   GLUCOSEU (A) 07/20/2021 1231    TEST NOT REPORTED DUE TO COLOR INTERFERENCE OF URINE PIGMENT   GLUCOSEU NEGATIVE 03/04/2021 1435   HGBUR (A) 07/20/2021 1231    TEST NOT REPORTED DUE TO COLOR INTERFERENCE OF URINE PIGMENT   HGBUR negative 03/15/2009 0804   BILIRUBINUR (A) 07/20/2021 1231    TEST NOT REPORTED DUE TO COLOR INTERFERENCE OF URINE PIGMENT   BILIRUBINUR n 12/01/2013 1136   KETONESUR (A) 07/20/2021 1231    TEST NOT REPORTED DUE TO COLOR INTERFERENCE OF URINE PIGMENT   PROTEINUR (A) 07/20/2021 1231    TEST NOT REPORTED  DUE TO COLOR INTERFERENCE OF URINE PIGMENT   UROBILINOGEN 0.2 03/04/2021 1435   NITRITE (A) 07/20/2021 1231    TEST NOT REPORTED DUE TO COLOR INTERFERENCE OF URINE PIGMENT   LEUKOCYTESUR (A) 07/20/2021 1231    TEST NOT REPORTED DUE TO COLOR INTERFERENCE OF URINE PIGMENT    Radiological Exams on Admission: DG Chest Port 1 View  Result Date: 07/20/2021 CLINICAL DATA:  Questionable sepsis, weakness EXAM: PORTABLE CHEST 1 VIEW COMPARISON:  03/27/2021 FINDINGS: Cardiomegaly. Both lungs are clear. The visualized skeletal structures are unremarkable. IMPRESSION: Cardiomegaly without acute abnormality of the lungs in AP portable projection. Electronically Signed   By: Delanna Ahmadi M.D.   On: 07/20/2021 11:43    EKG: Independently reviewed. Slow afib, bradycardia  Assessment/Plan UTI?     - admitted to inpt, tele     - continue cefepime for now as he's been on amoxicillin since his procedure     - follow Ucx, Bld Cx     - lactic acid improved  Bladder spasm Hematuria      - urology as reviewed; recommend no acute urological intervention; recommend oxybutynin PRN and continue foley until outpt follow up     - will follow Hgb; if he drops below 7, will transfuse  Paroxysmal A fib on pradaxa     - pradaxa has been held since before his TURP; continue to hold     - continue his home regimen otherwise  HTN      - continue home regimen  Hx CVA      - continue statin  Chest pain      - EKG w/o St elevations      - reported some CP this am; he believes he was having a panic attack at the time      - no current chest pain, not reproducible      - will cycle a set of troponins  DVT prophylaxis: SCDs  Code Status: FULL  Family Communication: w/ wife at bedside  Consults called: Urology   Status is: Inpatient  Remains inpatient appropriate because: severity of illness  [name] DO [facility]  If 7PM-7AM, please contact night-coverage [email or web address]  07/20/2021, 2:57 PM

## 2021-07-20 NOTE — ED Provider Notes (Signed)
[facility] DEPT Provider Note   CSN: [id number] Arrival date & time: 07/20/21  1011     History Chief Complaint  Patient presents with   Fever   Fatigue    Ryan [name] is a 75 y.o. male who presents with abdominal pain, hematuria, and painful urination.  He was seen in the emergency department on 10/15 for urinary retention s/p TURP with Dr. Diona Fanti on 10/13.  Urology was consulted and placed a Foley catheter.  Catheter was flushed and symptoms improved, urology felt patient was stable to discharge to home.  He was prescribed oxybutynin for bladder spasms, he has not been able to fill this.  Today he is presenting with weakness, lightheadedness, and chills.  Per EMS he was febrile in route to 100.4 F.  He has been unable to keep down food or drink since yesterday.    Fever Associated symptoms: chills and nausea   Associated symptoms: no chest pain       Past Medical History:  Diagnosis Date   Anemia    Anticoagulant long-term use    changed from eliquis to praxada 06/ 2022--- mananged by cardiology   BPH with urinary obstruction    CAD (coronary artery disease)    cardiologist--- dr g. taylor--- cath 04-22-2011  moderate LM stenosis, borderline sig pRCA, sig stenosis mid to distal LAD ;  aggressive medical therapy   Chronic diastolic CHF (congestive heart failure) ([place]) 03/2018   followed by cardiology   CKD (chronic kidney disease), stage II    COPD (chronic obstructive pulmonary disease) ([place])    followed by pcp   DOE (dyspnea on exertion)    per pt when walk >100yds,  per pt rides outside bike 4-8 miles daily without sob,  household chores and yard work without sob   Eczema    GERD (gastroesophageal reflux disease)    Glaucoma, left eye    Heart murmur    Hemorrhoids    History of adenomatous polyp of colon    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 Bryan Lemma--- dx by biopsy 01-28-2021   Macular degeneration of both eyes    Osteoporosis 05/31/2016   PAF (paroxysmal atrial fibrillation) ([place]) 03/2012   cardiologist-- dr g. 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   Elevated troponin 04/09/2020   Hypomagnesemia 04/09/2020   Hypokalemia 04/09/2020   Acute urinary retention 04/09/2020   Statin intolerance 12/19/2019   Vitamin D deficiency 09/18/2019   CHF (congestive heart failure) ([place]) 03/22/2018   PAF (paroxysmal atrial fibrillation) ([place]) 03/22/2018   Edema 03/22/2018   H/O: rheumatic fever 12/27/2017   Hyperglycemia 06/21/2017   Kidney stone 06/21/2017   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   Osteoporosis 05/31/2016   BPH (benign prostatic hyperplasia) 01/20/2015   Erectile dysfunction 01/20/2015   Rectal bleeding 01/20/2015   Preventative health care 01/20/2015   Eczema 08/03/2014   Sleep apnea 03/01/2014   Anticoagulation adequate with anticoagulant therapy 03/01/2014   Pre-syncope  02/26/2014   Abdominal pain 02/26/2014   PAD (peripheral artery disease) ([place]) 02/26/2014   Constipation 03/06/2013   Easy bruising 03/06/2013   History of alcohol abuse 04/15/2012   CAD (coronary atherosclerotic disease) 05/08/2011   Atypical chest pain 03/10/2011   LUMBAR RADICULOPATHY, 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   UNS ADVRS EFF UNS RX MEDICINAL&BIOLOGICAL SBSTNC 11/11/2009   H/O tobacco use, presenting hazards to health 07/29/2009   COPD GOLD I 07/29/2009   Hyperlipidemia, mixed 06/20/2007   Macular degeneration (senile) of retina 06/20/2007   Essential hypertension 06/20/2007   GERD 06/16/2007    Past Surgical History:  Procedure Laterality Date   ANTERIOR CERVICAL DECOMP/DISCECTOMY FUSION  06/08/2002   @[facility] ;  C5--C7   [facility]  04/22/2011   moderate left main and RCA stenosis not significant by FFR and IVUS on medical therapy   CARDIOVERSION N/A 04/25/2018   Procedure: CARDIOVERSION;  Surgeon: Skeet Latch, MD;  Location: [facility];  Service: Cardiovascular;  Laterality: N/A;   CATARACT EXTRACTION W/ INTRAOCULAR LENS IMPLANT Bilateral 2021   COLONOSCOPY WITH ESOPHAGOGASTRODUODENOSCOPY (EGD)  01/28/2021   by [facility]SLHTD   [place] LITHOTRIPSY     x2  1990s   FOOT SURGERY Right 1970   calcification removed from top of foot   KNEE ARTHROSCOPY Right 1991   LAPAROSCOPIC CHOLECYSTECTOMY  2000   TOTAL KNEE ARTHROPLASTY Right 08/14/2009   @[facility]    TRANSURETHRAL RESECTION OF PROSTATE N/A 07/17/2021   Procedure: TRANSURETHRAL RESECTION OF THE PROSTATE (TURP);  Surgeon: [name], MD;  Location: [facility];  Service: Urology;  Laterality: N/A;  1 HR       Family History  Problem Relation Age of Onset   Heart disease Mother    Diabetes Mother    Cirrhosis Mother    Emphysema Mother        never smoked but 2nd hand through her spouse   Hypertension Mother     Macular degeneration Mother    Heart disease Father    Cancer Father        prostate   Hyperlipidemia Father    Hypertension Father    Varicose Veins Father    Heart attack Father    Peripheral vascular disease Father    Heart disease Sister    Arthritis Sister    Hyperlipidemia Sister    Obesity Sister    Macular degeneration Sister    Heart disease Brother        5 stents   Hyperlipidemia Brother    Macular degeneration Maternal Grandfather    Cirrhosis Sister    Obesity Sister    Arthritis Sister    Heart disease Sister    Obesity Sister    Liver  disease Other    Prostate cancer Other    Coronary artery disease Other    Colon cancer Neg Hx    Esophageal cancer Neg Hx    Rectal cancer Neg Hx    Stomach cancer Neg Hx     Social History   Tobacco Use   Smoking status: Former    Packs/day: 0.50    Years: 50.00    Pack years: 25.00    Types: Cigarettes    Quit date: 05/01/2009    Years since quitting: 12.2   Smokeless tobacco: Former    Types: Chew    Quit date: 1995  Vaping Use   Vaping Use: Never used  Substance Use Topics   Alcohol use: Yes    Alcohol/week: 1.0 standard drink    Types: 1 Cans of beer per week    Comment: 1 beer daily   Drug use: Never    Home Medications Prior to Admission medications   Medication Sig Start Date End Date Taking? Authorizing Provider  amoxicillin (AMOXIL) 500 MG capsule Take 1 capsule (500 mg total) by mouth every 12 (twelve) hours. 07/18/21  Yes Dahlstedt, Annie Main, MD  brimonidine (ALPHAGAN) 0.2 % ophthalmic solution Place 1 drop into the left eye in the morning and at bedtime.  09/10/17  Yes [provider]  budesonide (ENTOCORT EC) 3 MG 24 hr capsule Take 2 capsules (6 mg total) by mouth daily for 42 days, THEN 1 capsule (3 mg total) daily. Patient taking differently: Take 2 capsules (6 mg total) by mouth daily for 42 days, THEN 1 capsule (3 mg total) daily. 07-15-2021 Per pt currently take 2 tabs in am  06/19/21 09/29/21 Yes Cirigliano, Vito V, DO  furosemide (LASIX) 20 MG tablet Take 1 tablet (20 mg total) by mouth daily as needed for edema (weight gain>3#/24 hours). Patient taking differently: Take 20 mg by mouth daily as needed for edema (weight gain>3#/24 hours). 05/16/21  Yes [name], MD  latanoprost (XALATAN) 0.005 % ophthalmic solution Place 1 drop into the left eye at bedtime.    Yes [provider]  levalbuterol (XOPENEX HFA) 45 MCG/ACT inhaler Inhale 2 puffs into the lungs every 4 (four) hours as needed for wheezing. 06/05/20 07/20/21 Yes Saguier, Percell Miller, PA-C  MAGnesium-Oxide 400 (240 Mg) MG tablet TAKE 1 TABLET(400 MG) BY MOUTH THREE TIMES DAILY AS NEEDED Patient taking differently: Take 400 mg by mouth 2 (two) times daily. 05/26/21  Yes [name], MD  metoprolol tartrate (LOPRESSOR) 25 MG tablet TAKE 1 TABLET BY MOUTH  TWICE DAILY Patient taking differently: Take 25 mg by mouth 2 (two) times daily. 06/11/21  Yes Evans Lance, MD  nitroGLYCERIN (NITROSTAT) 0.4 MG SL tablet Place 1 tablet (0.4 mg total) under the tongue every 5 (five) minutes as needed for chest pain. 04/14/20  Yes Allie Bossier, MD  omeprazole (PRILOSEC OTC) 20 MG tablet Take 20 mg by mouth daily.   Yes [provider]  potassium chloride (KLOR-CON) 10 MEQ tablet TAKE 1 TABLET BY MOUTH  DAILY Patient taking differently: Take 10 mEq by mouth daily. 06/10/21  Yes Evans Lance, MD  propafenone (RYTHMOL) 225 MG tablet TAKE 1 TABLET BY MOUTH  TWICE DAILY Patient taking differently: Take 225 mg by mouth 2 (two) times daily. 11/12/20  Yes Evans Lance, MD  Ranibizumab (LUCENTIS IO) Inject 1 Dose into the eye once as needed (Macular degeneration).   Yes [provider]  tamsulosin (FLOMAX) 0.4 MG CAPS  capsule Take 0.4 mg by mouth 2 (two) times daily.   Yes [provider]  clobetasol (TEMOVATE) 0.05 % external solution APPLY 1 APPLICATION TOPICALLY TWICE DAILY Patient not  taking: No sig reported 11/20/16   [name], MD  dabigatran (PRADAXA) 150 MG CAPS capsule Take 1 capsule (150 mg total) by mouth 2 (two) times daily. Patient not taking: No sig reported 05/21/21   Evans Lance, MD  oxybutynin (DITROPAN) 5 MG tablet Take 1 tablet (5 mg total) by mouth 3 (three) times daily as needed for up to 7 days for bladder spasms. 07/19/21 07/26/21  Henderly, Britni A, PA-C    Allergies    Albumin (human), Polymyxin b-trimethoprim, Pseudoephedrine, Codeine, Guaiacol, Statins, Gabapentin, Meloxicam, Pseudoephedrine-guaifenesin, Rosuvastatin calcium, Tapentadol, Ciprofloxacin, Moxifloxacin, and Rofecoxib  Review of Systems   Review of Systems  Constitutional:  Positive for chills and fever.  Respiratory:  Negative for shortness of breath.   Cardiovascular:  Negative for chest pain.  Gastrointestinal:  Positive for abdominal distention and nausea.  Genitourinary:  Positive for difficulty urinating, hematuria and penile pain.  Neurological:  Positive for dizziness, weakness and light-headedness.  All other systems reviewed and are negative.  Physical Exam Updated Vital Signs BP (!) 180/62   Pulse (!) 49   Temp 97.7 F (36.5 C) (Oral)   Resp 17   Ht 5\' 4"  (1.626 m)   Wt 78.6 kg   SpO2 96%   BMI 29.73 kg/m   Physical Exam Vitals and nursing note reviewed.  Constitutional:      Appearance: Normal appearance.  HENT:     Head: Normocephalic and atraumatic.  Eyes:     Conjunctiva/sclera: Conjunctivae normal.  Cardiovascular:     Rate and Rhythm: Normal rate and regular rhythm.  Pulmonary:     Effort: Pulmonary effort is normal. No respiratory distress.     Breath sounds: Normal breath sounds.  Abdominal:     General: There is no distension.     Palpations: Abdomen is soft.     Tenderness: There is generalized abdominal tenderness. There is no guarding or rebound.  Skin:    General: Skin is warm and dry.  Neurological:     General: No focal  deficit present.     Mental Status: He is alert.    ED Results / Procedures / Treatments   Labs (all labs ordered are listed, but only abnormal results are displayed) Labs Reviewed  CBC WITH DIFFERENTIAL/PLATELET - Abnormal; Notable for the following components:      Result Value   RBC 3.59 (*)    Hemoglobin 11.0 (*)    HCT 33.7 (*)    All other components within normal limits  BASIC METABOLIC PANEL - Abnormal; Notable for the following components:   Glucose, Bld 115 (*)    All other components within normal limits  LACTIC ACID, PLASMA - Abnormal; Notable for the following components:   Lactic Acid, Venous 2.3 (*)    All other components within normal limits  URINALYSIS, ROUTINE W REFLEX MICROSCOPIC - Abnormal; Notable for the following components:   Color, Urine RED (*)    APPearance CLOUDY (*)    Glucose, UA   (*)    Value: TEST NOT REPORTED DUE TO COLOR INTERFERENCE OF URINE PIGMENT   Hgb urine dipstick   (*)    Value: TEST NOT REPORTED DUE TO COLOR INTERFERENCE OF URINE PIGMENT   Bilirubin Urine   (*)    Value: TEST NOT REPORTED DUE  TO COLOR INTERFERENCE OF URINE PIGMENT   Ketones, ur   (*)    Value: TEST NOT REPORTED DUE TO COLOR INTERFERENCE OF URINE PIGMENT   Protein, ur   (*)    Value: TEST NOT REPORTED DUE TO COLOR INTERFERENCE OF URINE PIGMENT   Nitrite   (*)    Value: TEST NOT REPORTED DUE TO COLOR INTERFERENCE OF URINE PIGMENT   Leukocytes,Ua   (*)    Value: TEST NOT REPORTED DUE TO COLOR INTERFERENCE OF URINE PIGMENT   RBC / HPF >50 (*)    Bacteria, UA MANY (*)    All other components within normal limits  CBC WITH DIFFERENTIAL/PLATELET - Abnormal; Notable for the following components:   RBC 3.24 (*)    Hemoglobin 10.3 (*)    HCT 30.4 (*)    All other components within normal limits  RENAL FUNCTION PANEL - Abnormal; Notable for the following components:   Calcium 8.7 (*)    Albumin 3.1 (*)    All other components within normal limits  RESP PANEL BY RT-PCR  (FLU A&B, COVID) ARPGX2  CULTURE, BLOOD (ROUTINE X 2)  CULTURE, BLOOD (ROUTINE X 2)  URINE CULTURE  LACTIC ACID, PLASMA  PROTIME-INR  APTT    EKG EKG Interpretation  Date/Time:  Sunday July 20 2021 10:25:55 EDT Ventricular Rate:  51 PR Interval:  230 QRS Duration: 102 QT Interval:  454 QTC Calculation: 419 R Axis:   -14 Text Interpretation: Sinus or ectopic atrial rhythm Prolonged PR interval Borderline repolarization abnormality No acute changes No significant change since last tracing Confirmed by Nanavati, Ankit ([id number]) on 07/20/2021 1:38:58 PM  Radiology DG Chest Port 1 View  Result Date: 07/20/2021 CLINICAL DATA:  Questionable sepsis, weakness EXAM: PORTABLE CHEST 1 VIEW COMPARISON:  03/27/2021 FINDINGS: Cardiomegaly. Both lungs are clear. The visualized skeletal structures are unremarkable. IMPRESSION: Cardiomegaly without acute abnormality of the lungs in AP portable projection. Electronically Signed   By: Alex D Bibbey M.D.   On: 07/20/2021 11:43    Procedures Procedures   Medications Ordered in ED Medications  lactated ringers infusion ( Intravenous New Bag/Given 07/20/21 1135)  fentaNYL (SUBLIMAZE) injection 50 mcg (50 mcg Intravenous Given 07/20/21 1104)  ondansetron (ZOFRAN) injection 4 mg (4 mg Intravenous Given 07/20/21 1104)  lactated ringers bolus 1,000 mL (0 mLs Intravenous Stopped 07/20/21 1203)    And  lactated ringers bolus 1,000 mL (0 mLs Intravenous Stopped 07/20/21 1206)    And  lactated ringers bolus 500 mL (0 mLs Intravenous Stopped 07/20/21 1306)  ceFEPIme (MAXIPIME) 2 g in sodium chloride 0.9 % 100 mL IVPB (0 g Intravenous Stopped 07/20/21 1203)  fentaNYL (SUBLIMAZE) injection 50 mcg (50 mcg Intravenous Given 07/20/21 1250)    ED Course  I have reviewed the triage vital signs and the nursing notes.  Pertinent labs & imaging results that were available during my care of the patient were reviewed by me and considered in my medical decision  making (see chart for details).    MDM Rules/Calculators/A&P                           75  year old male who presents with abdominal distention, painful urination, and hematuria. S/p TURP with Dr. Diona Fanti on 10/13. Was seen in ED yesterday for urinary retention, urology consulted at bedside and placed foley catheter with outpatient prescription for oxybutynin which he has not been able to fill. Today he is complaining of weakness and lightheadedness.  On my exam patient is afebrile, diaphoretic with rigors.  He has diffuse abdominal tenderness to palpation without guarding or rebound.  Foley catheter in place, no overlying skin changes at catheter insertion site.  Septic workup initiated and antibiotics started. No leukocytosis, hemoglobin improved compared to prior. Urinalysis grossly bloody. Lactic 2.3. Blood cultures pending.   2:00pm Consulted with urology resident Dr Leeroy Bock who recommended admission to medicine team. He will come evaluate in the ED.   2:30pm Consulted hospitalist Dr. Marylyn Ishihara for admission.  The patient appears reasonably stabilized for admission considering the current resources, flow, and capabilities available in the ED at this time, and I doubt any other [facility] requiring further screening and/or treatment in the ED prior to admission.   Final Clinical Impression(s) / ED Diagnoses Final diagnoses:  Urinary tract infection associated with indwelling urethral catheter, subsequent encounter    Rx / DC Orders ED Discharge Orders     None        Chalese Peach T, PA-C 07/20/21 1726    Varney Biles, MD 07/21/21 1538

## 2021-07-20 NOTE — Consult Note (Signed)
Urology Consult Note   Requesting Attending Physician:  Varney Biles, MD Service Providing Consult: Urology  Consulting Attending: Dr. Raynelle Bring   Reason for Consult:  UTI post-TURP  HPI: Ryan [name] is seen in consultation for reasons noted above at the request of Varney Biles, MD for evaluation of UTI post-TURP.  This is a 75 y.o. male with Hx of BPH s/p TURP on 10/13 who presented to the ED on 10/15 in urinary retention requiring catheter placement. He had relief of symptoms at that time with minimal hematuria and no signs of infection, so he was discharge home with outpatient follow up.   While at home he awoke at 3 AM this morning with generalized weakness and severe bladder spasms. He was never able to pick up the oxybutynin that was ordered to his pharmacy. Due to ongoing weakness he was brought back to the [facility]. In transit to the ED EMS reported a temperature of 100.6F; however, the patient was not complaining of fevers prior to this.   He also endorses that he has been dealing w/ significant constipation; although, he was able to have a small BM yesterday.   He has been taking amoxacillin post-operatively for UTI prevention.  Past Medical History: Past Medical History:  Diagnosis Date   Anemia    Anticoagulant long-term use    changed from eliquis to praxada 06/ 2022--- mananged by cardiology   BPH with urinary obstruction    CAD (coronary artery disease)    cardiologist--- dr g. taylor--- cath 04-22-2011  moderate LM stenosis, borderline sig pRCA, sig stenosis mid to distal LAD ;  aggressive medical therapy   Chronic diastolic CHF (congestive heart failure) ([place]) 03/2018   followed by cardiology   CKD (chronic kidney disease), stage II    COPD (chronic obstructive pulmonary disease) ([place])    followed by pcp   DOE (dyspnea on exertion)    per pt when walk >100yds,  per pt rides outside bike 4-8 miles daily without sob,  household chores and yard work  without sob   Eczema    GERD (gastroesophageal reflux disease)    Glaucoma, left eye    Heart murmur    Hemorrhoids    History of adenomatous polyp of colon    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 Bryan Lemma--- dx by biopsy 01-28-2021   Macular degeneration of both eyes    Osteoporosis 05/31/2016   PAF (paroxysmal atrial fibrillation) ([place]) 03/2012   cardiologist-- dr g. taylor;  first dx 06/ 2013 AFlutter w/ RVR   Urethral lesion    Vitamin D deficiency     Past Surgical History:  Past Surgical History:  Procedure Laterality Date   ANTERIOR CERVICAL DECOMP/DISCECTOMY FUSION  06/08/2002   @[facility] ;  C5--C7  [place]  04/22/2011   moderate left main and RCA stenosis not significant by FFR and IVUS on medical therapy   CARDIOVERSION N/A 04/25/2018   Procedure: CARDIOVERSION;  Surgeon: Skeet Latch, MD;  Location: [place];  Service: Cardiovascular;  Laterality: N/A;   CATARACT EXTRACTION W/ INTRAOCULAR LENS IMPLANT Bilateral 2021   COLONOSCOPY WITH ESOPHAGOGASTRODUODENOSCOPY (EGD)  01/28/2021   by [place] LITHOTRIPSY     x2  1990s   FOOT SURGERY Right 1970   calcification removed from top of foot   KNEE ARTHROSCOPY Right 1991   LAPAROSCOPIC CHOLECYSTECTOMY  2000   TOTAL KNEE ARTHROPLASTY Right 08/14/2009   @[facility]    TRANSURETHRAL  RESECTION OF PROSTATE N/A 07/17/2021   Procedure: TRANSURETHRAL RESECTION OF THE PROSTATE (TURP);  Surgeon: [name], MD;  Location: [facility];  Service: Urology;  Laterality: N/A;  1 HR    Medication: Current Facility-Administered Medications  Medication Dose Route Frequency Provider Last Rate Last Admin   lactated ringers infusion   Intravenous Continuous Roemhildt, Lorin T, PA-C 150 mL/hr at 07/20/21 1135 New Bag at 07/20/21 1135   Current Outpatient Medications  Medication Sig Dispense Refill   amoxicillin (AMOXIL) 500 MG capsule Take 1 capsule (500 mg total) by mouth every 12 (twelve) hours. 6 capsule 0   brimonidine (ALPHAGAN) 0.2 % ophthalmic solution Place 1 drop into the left eye in the morning and at bedtime.   7   budesonide (ENTOCORT EC) 3 MG 24 hr capsule Take 2 capsules (6 mg total) by mouth daily for 42 days, THEN 1 capsule (3 mg total) daily. (Patient taking differently: Take 2 capsules (6 mg total) by mouth daily for 42 days, THEN 1 capsule (3 mg total) daily. 07-15-2021 Per pt currently take 2 tabs in am) 160 capsule 0   furosemide (LASIX) 20 MG tablet Take 1 tablet (20 mg total) by mouth daily as needed for edema (weight gain>3#/24 hours). (Patient taking differently: Take 20 mg by mouth daily as needed for edema (weight gain>3#/24 hours).) 90 tablet 1   latanoprost (XALATAN) 0.005 % ophthalmic solution Place 1 drop into the left eye at bedtime.      levalbuterol (XOPENEX HFA) 45 MCG/ACT inhaler Inhale 2 puffs into the lungs every 4 (four) hours as needed for wheezing. 45 g 2   MAGnesium-Oxide 400 (240 Mg) MG tablet TAKE 1 TABLET(400 MG) BY MOUTH THREE TIMES DAILY AS NEEDED (Patient taking differently: Take 400 mg by mouth 2 (two) times daily.) 270 tablet 1   metoprolol tartrate (LOPRESSOR) 25 MG tablet TAKE 1 TABLET BY MOUTH  TWICE DAILY (Patient taking differently: Take 25 mg by mouth 2 (two) times daily.) 180 tablet 3   nitroGLYCERIN (NITROSTAT)  0.4 MG SL tablet Place 1 tablet (0.4 mg total) under the tongue every 5 (five) minutes as needed for chest pain. 30 tablet 0   omeprazole (PRILOSEC OTC) 20 MG tablet Take 20 mg by mouth daily.     potassium chloride (KLOR-CON) 10 MEQ tablet TAKE 1 TABLET BY MOUTH  DAILY (Patient taking differently: Take 10 mEq by mouth daily.) 90 tablet 3   propafenone (RYTHMOL) 225 MG tablet TAKE 1 TABLET BY MOUTH  TWICE DAILY (Patient taking differently: Take 225 mg by mouth 2 (two) times daily.) 180 tablet 2   Ranibizumab (LUCENTIS IO) Inject 1 Dose into the eye once as needed (Macular degeneration).     tamsulosin (FLOMAX) 0.4 MG CAPS capsule Take  0.4 mg by mouth 2 (two) times daily.     clobetasol (TEMOVATE) 0.05 % external solution APPLY 1 APPLICATION TOPICALLY TWICE DAILY (Patient not taking: No sig reported) 100 mL 3   dabigatran (PRADAXA) 150 MG CAPS capsule Take 1 capsule (150 mg total) by mouth 2 (two) times daily. (Patient not taking: No sig reported) 180 capsule 3   oxybutynin (DITROPAN) 5 MG tablet Take 1 tablet (5 mg total) by mouth 3 (three) times daily as needed for up to 7 days for bladder spasms. 21 tablet 0    Allergies: Allergies  Allergen Reactions   Albumin (Human) Anaphylaxis   Polymyxin B-Trimethoprim Swelling    Eye drops made eyes swell   Pseudoephedrine Other (See Comments)    Stomach cramps   Codeine Hives, Itching and Rash   Guaiacol Other (See Comments)    Hallucinations   Statins Other (See Comments)    Muscle cramps    Gabapentin Other (See Comments)    Pt unsure of sensitivity   Meloxicam Other (See Comments)    Pt unsure of sensitivity   Pseudoephedrine-Guaifenesin Nausea And Vomiting    Stomach cramps   Rosuvastatin Calcium Other (See Comments)    Muscle aches   Tapentadol Other (See Comments)    Pt unsure of sensitivity   Ciprofloxacin Hives, Itching, Nausea Only and Rash   Moxifloxacin Nausea Only and Other (See Comments)    Headaches, stomach cramps     Rofecoxib Other (See Comments)    Stomach cramping    Social History: Social History   Tobacco Use   Smoking status: Former    Packs/day: 0.50    Years: 50.00    Pack years: 25.00    Types: Cigarettes    Quit date: 05/01/2009    Years since quitting: 12.2   Smokeless tobacco: Former    Types: Chew    Quit date: 1995  Vaping Use   Vaping Use: Never used  Substance Use Topics   Alcohol use: Yes    Alcohol/week: 1.0 standard drink    Types: 1 Cans of beer per week    Comment: 1 beer daily   Drug use: Never    Family History Family History  Problem Relation Age of Onset   Heart disease Mother    Diabetes Mother    Cirrhosis Mother    Emphysema Mother        never smoked but 2nd hand through her spouse   Hypertension Mother    Macular degeneration Mother    Heart disease Father    Cancer Father        prostate   Hyperlipidemia Father    Hypertension Father    Varicose Veins Father    Heart attack Father    Peripheral vascular disease Father    Heart disease Sister    Arthritis Sister    Hyperlipidemia Sister    Obesity Sister    Macular degeneration Sister    Heart disease Brother        5 stents   Hyperlipidemia Brother    Macular degeneration Maternal Grandfather    Cirrhosis Sister    Obesity Sister    Arthritis Sister    Heart disease Sister    Obesity Sister    Liver disease Other    Prostate cancer Other    Coronary artery disease Other    Colon cancer Neg Hx    Esophageal cancer Neg Hx    Rectal cancer Neg Hx    Stomach  cancer Neg Hx     Review of Systems 10 systems were reviewed and are negative except as noted specifically in the HPI.  Objective   Vital signs in last 24 hours: BP (!) 173/66   Pulse (!) 54   Temp 98.1 F (36.7 C) (Oral)   Resp 16   Ht 5\' 4"  (1.626 m)   Wt 78.6 kg   SpO2 99%   BMI 29.73 kg/m   Physical Exam General: NAD, A&O, resting, appropriate HEENT: [place]/AT, EOMI, MMM Pulmonary: Normal work of  breathing Cardiovascular: HDS, adequate peripheral perfusion Abdomen: Soft, mild diffuse tenderness of the abdomen , mildly distended. GU: Normal external male genitalia, no CVA tenderness, Foley catheter draining clear light pink urine with mild old clot/tissue debris from recent TURP Extremities: warm and well perfused Neuro: Appropriate, no focal neurological deficits  Most Recent Labs: Lab Results  Component Value Date   WBC 9.3 07/20/2021   HGB 11.0 (L) 07/20/2021   HCT 33.7 (L) 07/20/2021   PLT 197 07/20/2021    Lab Results  Component Value Date   NA 135 07/20/2021   K 4.0 07/20/2021   CL 104 07/20/2021   CO2 23 07/20/2021   BUN 22 07/20/2021   CREATININE 1.20 07/20/2021   CALCIUM 9.1 07/20/2021   MG 1.6 05/23/2021   PHOS 3.9 03/25/2021    Lab Results  Component Value Date   INR 1.1 07/20/2021   APTT 28 07/20/2021    IMAGING: DG Chest Port 1 View  Result Date: 07/20/2021 CLINICAL DATA:  Questionable sepsis, weakness EXAM: PORTABLE CHEST 1 VIEW COMPARISON:  03/27/2021 FINDINGS: Cardiomegaly. Both lungs are clear. The visualized skeletal structures are unremarkable. IMPRESSION: Cardiomegaly without acute abnormality of the lungs in AP portable projection. Electronically Signed   By: Delanna Ahmadi M.D.   On: 07/20/2021 11:43    ------  Assessment:  75 y.o. male with Hx of BPH s/p TURP on 10/13 c/b urinary retention requiring catheter placement on 10/15 who returns to the ED w/ generalized weakness, bladder spasms, fever with concern for UTI.   Overall, the patient has been hemodynamically stable in the ED without documented fever. His catheter appears to be draining well without significant hematuria, and his Hgb is stable from his prior ED visits. His UA's have bacteria present; however, they have not had any nitrites, LE, or many WBC's present (w/ the knowledge that his UA today is not able to be interpreted due to blood). Although this is in the presence of  current antibiotic therapy w/ amoxacillin.   While his lactate was elevated to 2.3 initially, it has nicely improved to 0.9 on recheck.   Recommendations: - No acute urologic intervention needed. - Agree with broad spectrum antibiotics. Follow up urine and blood cultures and tailor antibiotics as appropriate. - Continue Foley catheter. - Recommend Oxybutynin 5 mg TID PRN for bladder spasms - Once discharged will set up follow up for catheter removal and TOV  Thank you for this consult. Please contact the urology consult pager with any further questions/concerns.  Reola Mosher, MD [facility] Urology Resident, [facility]

## 2021-07-20 NOTE — ED Triage Notes (Signed)
BIBA,.  Per EMS pt reports feeling weaker than normal and a fever. Pt also reports bladder spasms and hematuria. He was here for the same 10/15. only new complaints today is feeling weak and fever at home.   156/78 CBG 113 HR 56 95% room air.  100. 4 Temporal.  End tidal 28 RR 20 20 G left hand  250 ml NS.  1000 mg tylenol.

## 2021-07-20 NOTE — Progress Notes (Signed)
Pharmacy Antibiotic Note  Ryan [name] is a 75 y.o. male admitted on 07/20/2021 with sepsis.  Pharmacy has been consulted for cefepime dosing.  Plan: Cefepime 2 gr IV q12h  Monitor clinical course, renal function, cultures as available  Height: 5\' 4"  (162.6 cm) Weight: 79.7 kg (175 lb 11.3 oz) IBW/kg (Calculated) : 59.2  Temp (24hrs), Avg:98.1 F (36.7 C), Min:97.7 F (36.5 C), Max:98.4 F (36.9 C)  Recent Labs  Lab 07/17/21 0628 07/19/21 1556 07/20/21 1057 07/20/21 1103 07/20/21 1303 07/20/21 1502 07/20/21 1530  WBC  --  12.4* 9.3  --   --  8.5  --   CREATININE 1.30* 1.42* 1.20  --   --   --  1.17  LATICACIDVEN  --   --   --  2.3* 0.9  --   --     Estimated Creatinine Clearance: 52.8 mL/min (by C-G formula based on SCr of 1.17 mg/dL).    Allergies  Allergen Reactions   Albumin (Human) Anaphylaxis   Polymyxin B-Trimethoprim Swelling    Eye drops made eyes swell   Pseudoephedrine Other (See Comments)    Stomach cramps   Codeine Hives, Itching and Rash   Guaiacol Other (See Comments)    Hallucinations   Statins Other (See Comments)    Muscle cramps    Gabapentin Other (See Comments)    Pt unsure of sensitivity   Meloxicam Other (See Comments)    Pt unsure of sensitivity   Pseudoephedrine-Guaifenesin Nausea And Vomiting    Stomach cramps   Rosuvastatin Calcium Other (See Comments)    Muscle aches   Tapentadol Other (See Comments)    Pt unsure of sensitivity   Ciprofloxacin Hives, Itching, Nausea Only and Rash   Moxifloxacin Nausea Only and Other (See Comments)    Headaches, stomach cramps    Rofecoxib Other (See Comments)    Stomach cramping    Antimicrobials this admission:  10/16 cefepime >>     Dose adjustments this admission:   Microbiology results: 10/16 BCx:  10/16 UCx:      Thank you for allowing pharmacy to be a part of this patient's care.  Royetta Asal, PharmD, BCPS 07/20/2021 6:39 PM

## 2021-07-20 NOTE — Progress Notes (Signed)
[place] following sepsis 

## 2021-07-21 ENCOUNTER — Encounter (HOSPITAL_COMMUNITY)
Admission: EM | Disposition: A | Discharge status: Home / Self Care | Payer: Self-pay | Source: Home / Self Care | Attending: Thoracic Surgery (Cardiothoracic Vascular Surgery)

## 2021-07-21 ENCOUNTER — Inpatient Hospital Stay (HOSPITAL_COMMUNITY): Payer: Medicare Other

## 2021-07-21 ENCOUNTER — Encounter (HOSPITAL_COMMUNITY): Payer: Self-pay | Admitting: Internal Medicine

## 2021-07-21 DIAGNOSIS — I25110 Atherosclerotic heart disease of native coronary artery with unstable angina pectoris: Secondary | ICD-10-CM | POA: Diagnosis not present

## 2021-07-21 DIAGNOSIS — Z87891 Personal history of nicotine dependence: Secondary | ICD-10-CM

## 2021-07-21 DIAGNOSIS — R319 Hematuria, unspecified: Secondary | ICD-10-CM | POA: Diagnosis not present

## 2021-07-21 DIAGNOSIS — T83511D Infection and inflammatory reaction due to indwelling urethral catheter, subsequent encounter: Secondary | ICD-10-CM

## 2021-07-21 DIAGNOSIS — Z01810 Encounter for preprocedural cardiovascular examination: Secondary | ICD-10-CM | POA: Diagnosis not present

## 2021-07-21 DIAGNOSIS — I2510 Atherosclerotic heart disease of native coronary artery without angina pectoris: Secondary | ICD-10-CM

## 2021-07-21 DIAGNOSIS — Z7901 Long term (current) use of anticoagulants: Secondary | ICD-10-CM

## 2021-07-21 DIAGNOSIS — R079 Chest pain, unspecified: Secondary | ICD-10-CM | POA: Diagnosis not present

## 2021-07-21 DIAGNOSIS — I5032 Chronic diastolic (congestive) heart failure: Secondary | ICD-10-CM

## 2021-07-21 DIAGNOSIS — I249 Acute ischemic heart disease, unspecified: Secondary | ICD-10-CM

## 2021-07-21 DIAGNOSIS — I480 Paroxysmal atrial fibrillation: Secondary | ICD-10-CM

## 2021-07-21 DIAGNOSIS — Z8619 Personal history of other infectious and parasitic diseases: Secondary | ICD-10-CM

## 2021-07-21 DIAGNOSIS — Z8673 Personal history of transient ischemic attack (TIA), and cerebral infarction without residual deficits: Secondary | ICD-10-CM

## 2021-07-21 DIAGNOSIS — D649 Anemia, unspecified: Secondary | ICD-10-CM

## 2021-07-21 DIAGNOSIS — J449 Chronic obstructive pulmonary disease, unspecified: Secondary | ICD-10-CM

## 2021-07-21 DIAGNOSIS — I130 Hypertensive heart and chronic kidney disease with heart failure and stage 1 through stage 4 chronic kidney disease, or unspecified chronic kidney disease: Secondary | ICD-10-CM

## 2021-07-21 DIAGNOSIS — Z8249 Family history of ischemic heart disease and other diseases of the circulatory system: Secondary | ICD-10-CM

## 2021-07-21 DIAGNOSIS — N1831 Chronic kidney disease, stage 3a: Secondary | ICD-10-CM

## 2021-07-21 DIAGNOSIS — N390 Urinary tract infection, site not specified: Secondary | ICD-10-CM

## 2021-07-21 HISTORY — PX: LEFT HEART CATH AND CORONARY ANGIOGRAPHY: CATH118249

## 2021-07-21 LAB — COMPREHENSIVE METABOLIC PANEL
ALT: 12 U/L (ref 0–44)
AST: 15 U/L (ref 15–41)
Albumin: 3.1 g/dL — ABNORMAL LOW (ref 3.5–5.0)
Alkaline Phosphatase: 48 U/L (ref 38–126)
Anion gap: 3 — ABNORMAL LOW (ref 5–15)
BUN: 15 mg/dL (ref 8–23)
CO2: 28 mmol/L (ref 22–32)
Calcium: 8.8 mg/dL — ABNORMAL LOW (ref 8.9–10.3)
Chloride: 106 mmol/L (ref 98–111)
Creatinine, Ser: 1.12 mg/dL (ref 0.61–1.24)
GFR, Estimated: >60 mL/min (ref >60)
Glucose, Bld: 92 mg/dL (ref 70–99)
Potassium: 4.2 mmol/L (ref 3.5–5.1)
Sodium: 137 mmol/L (ref 135–145)
Total Bilirubin: 0.8 mg/dL (ref 0.3–1.2)
Total Protein: 5.7 g/dL — ABNORMAL LOW (ref 6.5–8.1)

## 2021-07-21 LAB — SURGICAL PATHOLOGY

## 2021-07-21 LAB — URINE CULTURE
Culture: NO GROWTH
Culture: NO GROWTH

## 2021-07-21 LAB — HEMOGLOBIN A1C
Hgb A1c MFr Bld: 5 % (ref 4.8–5.6)
Mean Plasma Glucose: 96.8 mg/dL

## 2021-07-21 LAB — CBC
HCT: 29.8 % — ABNORMAL LOW (ref 39.0–52.0)
Hemoglobin: 9.9 g/dL — ABNORMAL LOW (ref 13.0–17.0)
MCH: 31.6 pg (ref 26.0–34.0)
MCHC: 33.2 g/dL (ref 30.0–36.0)
MCV: 95.2 fL (ref 80.0–100.0)
Platelets: 160 10*3/uL (ref 150–400)
RBC: 3.13 MIL/uL — ABNORMAL LOW (ref 4.22–5.81)
RDW: 12.8 % (ref 11.5–15.5)
WBC: 8.7 10*3/uL (ref 4.0–10.5)
nRBC: 0 % (ref 0.0–0.2)

## 2021-07-21 LAB — D-DIMER, QUANTITATIVE: D-Dimer, Quant: 1.2 ug/mL-FEU — ABNORMAL HIGH (ref 0.00–0.50)

## 2021-07-21 LAB — TROPONIN I (HIGH SENSITIVITY)
Troponin I (High Sensitivity): 13 ng/L (ref <18)
Troponin I (High Sensitivity): 24 ng/L — ABNORMAL HIGH (ref <18)

## 2021-07-21 LAB — HEMOGLOBIN AND HEMATOCRIT, BLOOD
HCT: 27.6 % — ABNORMAL LOW (ref 39.0–52.0)
Hemoglobin: 9.4 g/dL — ABNORMAL LOW (ref 13.0–17.0)

## 2021-07-21 SURGERY — LEFT HEART CATH AND CORONARY ANGIOGRAPHY
Anesthesia: LOCAL

## 2021-07-21 MED ORDER — CHLORHEXIDINE GLUCONATE CLOTH 2 % EX PADS: 6.0000 | MEDICATED_PAD | Freq: Once | CUTANEOUS | Status: DC

## 2021-07-21 MED ORDER — TRANEXAMIC ACID (OHS) BOLUS VIA INFUSION
15.0000 mg/kg | INTRAVENOUS | Status: AC
  Administered 2021-07-22: 1194 mg via INTRAVENOUS
  Filled 2021-07-21: qty 1194

## 2021-07-21 MED ORDER — SODIUM CHLORIDE 0.9% FLUSH: 3.0000 mL | INTRAVENOUS | Status: DC | PRN

## 2021-07-21 MED ORDER — VANCOMYCIN HCL 1250 MG/250ML IV SOLN
1250.0000 mg | INTRAVENOUS | Status: AC
  Administered 2021-07-22: 1250 mg via INTRAVENOUS
  Filled 2021-07-21 (×2): qty 250

## 2021-07-21 MED ORDER — EPINEPHRINE HCL 5 MG/250ML IV SOLN IN NS
0.0000 ug/min | INTRAVENOUS | Status: DC
  Filled 2021-07-21: qty 250

## 2021-07-21 MED ORDER — HEPARIN (PORCINE) IN NACL 1000-0.9 UT/500ML-% IV SOLN
INTRAVENOUS | Status: DC | PRN
  Administered 2021-07-21 (×2): 500 mL

## 2021-07-21 MED ORDER — CHLORHEXIDINE GLUCONATE CLOTH 2 % EX PADS
6.0000 | MEDICATED_PAD | Freq: Every day | CUTANEOUS | Status: DC
  Administered 2021-07-21: 6 via TOPICAL

## 2021-07-21 MED ORDER — NITROGLYCERIN IN D5W 200-5 MCG/ML-% IV SOLN
5.0000 ug/min | INTRAVENOUS | Status: DC
  Administered 2021-07-21: 10 ug/min via INTRAVENOUS
  Filled 2021-07-21: qty 250

## 2021-07-21 MED ORDER — NITROGLYCERIN IN D5W 200-5 MCG/ML-% IV SOLN
INTRAVENOUS | Status: AC
  Filled 2021-07-21: qty 250

## 2021-07-21 MED ORDER — HYDRALAZINE HCL 20 MG/ML IJ SOLN: 10.0000 mg | INTRAMUSCULAR | Status: DC | PRN

## 2021-07-21 MED ORDER — SODIUM CHLORIDE 0.9% FLUSH
3.0000 mL | Freq: Two times a day (BID) | INTRAVENOUS | Status: DC
  Administered 2021-07-21: 3 mL via INTRAVENOUS

## 2021-07-21 MED ORDER — CEFAZOLIN SODIUM-DEXTROSE 2-4 GM/100ML-% IV SOLN
2.0000 g | INTRAVENOUS | Status: DC
  Filled 2021-07-21 (×2): qty 100

## 2021-07-21 MED ORDER — PLASMA-LYTE A IV SOLN
INTRAVENOUS | Status: AC
  Administered 2021-07-22: 1000 mL
  Filled 2021-07-21: qty 5

## 2021-07-21 MED ORDER — DIAZEPAM 2 MG PO TABS
2.0000 mg | ORAL_TABLET | Freq: Once | ORAL | Status: AC
  Administered 2021-07-22: 2 mg via ORAL
  Filled 2021-07-21: qty 1

## 2021-07-21 MED ORDER — ASPIRIN 81 MG PO CHEW: 81.0000 mg | CHEWABLE_TABLET | ORAL | Status: DC

## 2021-07-21 MED ORDER — ASPIRIN 81 MG PO CHEW: 81.0000 mg | CHEWABLE_TABLET | Freq: Every day | ORAL | Status: DC

## 2021-07-21 MED ORDER — HEPARIN (PORCINE) IN NACL 1000-0.9 UT/500ML-% IV SOLN
INTRAVENOUS | Status: AC
  Filled 2021-07-21: qty 1000

## 2021-07-21 MED ORDER — SODIUM CHLORIDE 0.9 % WEIGHT BASED INFUSION: 3.0000 mL/kg/h | INTRAVENOUS | Status: DC

## 2021-07-21 MED ORDER — NITROGLYCERIN IN D5W 200-5 MCG/ML-% IV SOLN
2.0000 ug/min | INTRAVENOUS | Status: AC
  Administered 2021-07-22: 30 ug/min via INTRAVENOUS
  Filled 2021-07-21: qty 250

## 2021-07-21 MED ORDER — MAGNESIUM SULFATE 50 % IJ SOLN
40.0000 meq | INTRAMUSCULAR | Status: DC
  Filled 2021-07-21: qty 9.85

## 2021-07-21 MED ORDER — ASPIRIN 81 MG PO CHEW
324.0000 mg | CHEWABLE_TABLET | Freq: Once | ORAL | Status: AC
  Administered 2021-07-21: 324 mg via ORAL

## 2021-07-21 MED ORDER — FENTANYL CITRATE (PF) 100 MCG/2ML IJ SOLN
INTRAMUSCULAR | Status: DC | PRN
  Administered 2021-07-21: 25 ug via INTRAVENOUS

## 2021-07-21 MED ORDER — LABETALOL HCL 5 MG/ML IV SOLN: 10.0000 mg | INTRAVENOUS | Status: DC | PRN

## 2021-07-21 MED ORDER — MIDAZOLAM HCL 2 MG/2ML IJ SOLN
INTRAMUSCULAR | Status: DC | PRN
  Administered 2021-07-21: 1 mg via INTRAVENOUS

## 2021-07-21 MED ORDER — TRANEXAMIC ACID 1000 MG/10ML IV SOLN
1.5000 mg/kg/h | INTRAVENOUS | Status: AC
  Administered 2021-07-22: 1.5 mg/kg/h via INTRAVENOUS
  Filled 2021-07-21: qty 25

## 2021-07-21 MED ORDER — ONDANSETRON HCL 4 MG/2ML IJ SOLN: 4.0000 mg | Freq: Four times a day (QID) | INTRAMUSCULAR | Status: DC | PRN

## 2021-07-21 MED ORDER — BISACODYL 5 MG PO TBEC
5.0000 mg | DELAYED_RELEASE_TABLET | Freq: Once | ORAL | Status: DC
  Filled 2021-07-21: qty 1

## 2021-07-21 MED ORDER — HEPARIN (PORCINE) 25000 UT/250ML-% IV SOLN
1000.0000 [IU]/h | INTRAVENOUS | Status: DC
  Administered 2021-07-21: 1000 [IU]/h via INTRAVENOUS
  Filled 2021-07-21: qty 250

## 2021-07-21 MED ORDER — HEPARIN BOLUS VIA INFUSION
4000.0000 [IU] | INTRAVENOUS | Status: AC
  Administered 2021-07-21: 4000 [IU] via INTRAVENOUS
  Filled 2021-07-21: qty 4000

## 2021-07-21 MED ORDER — SODIUM CHLORIDE 0.9 % WEIGHT BASED INFUSION
3.0000 mL/kg/h | INTRAVENOUS | Status: DC
  Administered 2021-07-21 (×2): 3 mL/kg/h via INTRAVENOUS

## 2021-07-21 MED ORDER — SODIUM CHLORIDE 0.9 % WEIGHT BASED INFUSION: 1.0000 mL/kg/h | INTRAVENOUS | Status: DC

## 2021-07-21 MED ORDER — SODIUM CHLORIDE 0.9 % IV SOLN: INTRAVENOUS | Status: AC

## 2021-07-21 MED ORDER — CEFAZOLIN SODIUM-DEXTROSE 2-4 GM/100ML-% IV SOLN
2.0000 g | INTRAVENOUS | Status: AC
  Administered 2021-07-22 (×2): 2 g via INTRAVENOUS
  Filled 2021-07-21 (×2): qty 100

## 2021-07-21 MED ORDER — LIDOCAINE HCL (PF) 1 % IJ SOLN
INTRAMUSCULAR | Status: DC | PRN
  Administered 2021-07-21: 2 mL

## 2021-07-21 MED ORDER — FENTANYL CITRATE (PF) 100 MCG/2ML IJ SOLN
INTRAMUSCULAR | Status: AC
  Filled 2021-07-21: qty 2

## 2021-07-21 MED ORDER — NOREPINEPHRINE 4 MG/250ML-% IV SOLN
0.0000 ug/min | INTRAVENOUS | Status: DC
  Filled 2021-07-21: qty 250

## 2021-07-21 MED ORDER — DEXMEDETOMIDINE HCL IN NACL 400 MCG/100ML IV SOLN
0.1000 ug/kg/h | INTRAVENOUS | Status: AC
  Administered 2021-07-22: .5 ug/kg/h via INTRAVENOUS
  Filled 2021-07-21: qty 100

## 2021-07-21 MED ORDER — POTASSIUM CHLORIDE 2 MEQ/ML IV SOLN
80.0000 meq | INTRAVENOUS | Status: DC
  Filled 2021-07-21: qty 40

## 2021-07-21 MED ORDER — MORPHINE SULFATE (PF) 2 MG/ML IV SOLN
INTRAVENOUS | Status: AC
  Filled 2021-07-21: qty 1

## 2021-07-21 MED ORDER — LIDOCAINE HCL (PF) 1 % IJ SOLN
INTRAMUSCULAR | Status: AC
  Filled 2021-07-21: qty 30

## 2021-07-21 MED ORDER — HYDROCODONE-ACETAMINOPHEN 5-325 MG PO TABS
1.0000 | ORAL_TABLET | Freq: Four times a day (QID) | ORAL | Status: AC | PRN
  Administered 2021-07-21: 1 via ORAL
  Filled 2021-07-21: qty 1

## 2021-07-21 MED ORDER — CHLORHEXIDINE GLUCONATE CLOTH 2 % EX PADS
6.0000 | MEDICATED_PAD | Freq: Once | CUTANEOUS | Status: AC
  Administered 2021-07-21: 6 via TOPICAL

## 2021-07-21 MED ORDER — NITROGLYCERIN 1 MG/10 ML FOR IR/CATH LAB
INTRA_ARTERIAL | Status: AC
  Filled 2021-07-21: qty 10

## 2021-07-21 MED ORDER — METOPROLOL TARTRATE 12.5 MG HALF TABLET: 12.5000 mg | ORAL_TABLET | Freq: Once | ORAL | Status: DC

## 2021-07-21 MED ORDER — TRANEXAMIC ACID (OHS) PUMP PRIME SOLUTION
2.0000 mg/kg | INTRAVENOUS | Status: DC
  Filled 2021-07-21: qty 1.59

## 2021-07-21 MED ORDER — MORPHINE SULFATE (PF) 2 MG/ML IV SOLN
2.0000 mg | INTRAVENOUS | Status: DC | PRN
  Administered 2021-07-21 (×2): 2 mg via INTRAVENOUS
  Filled 2021-07-21: qty 1

## 2021-07-21 MED ORDER — PHENYLEPHRINE HCL-NACL 20-0.9 MG/250ML-% IV SOLN
30.0000 ug/min | INTRAVENOUS | Status: AC
  Administered 2021-07-22: 20 ug/min via INTRAVENOUS
  Filled 2021-07-21: qty 250

## 2021-07-21 MED ORDER — SODIUM CHLORIDE 0.9 % IV SOLN: 250.0000 mL | INTRAVENOUS | Status: DC | PRN

## 2021-07-21 MED ORDER — NITROGLYCERIN 0.4 MG SL SUBL
0.4000 mg | SUBLINGUAL_TABLET | SUBLINGUAL | Status: DC | PRN
  Administered 2021-07-21 (×3): 0.4 mg via SUBLINGUAL
  Filled 2021-07-21 (×2): qty 1

## 2021-07-21 MED ORDER — ACETAMINOPHEN 325 MG PO TABS: 650.0000 mg | ORAL_TABLET | ORAL | Status: DC | PRN

## 2021-07-21 MED ORDER — HEPARIN SODIUM (PORCINE) 1000 UNIT/ML IJ SOLN
INTRAMUSCULAR | Status: DC | PRN
  Administered 2021-07-21: 4000 [IU] via INTRAVENOUS

## 2021-07-21 MED ORDER — HEPARIN (PORCINE) 25000 UT/250ML-% IV SOLN
1050.0000 [IU]/h | INTRAVENOUS | Status: DC
  Administered 2021-07-21: 1050 [IU]/h via INTRAVENOUS
  Filled 2021-07-21 (×2): qty 250

## 2021-07-21 MED ORDER — SODIUM CHLORIDE 0.9 % WEIGHT BASED INFUSION
1.0000 mL/kg/h | INTRAVENOUS | Status: DC
  Administered 2021-07-21: 1 mL/kg/h via INTRAVENOUS

## 2021-07-21 MED ORDER — VERAPAMIL HCL 2.5 MG/ML IV SOLN
INTRA_ARTERIAL | Status: DC | PRN
  Administered 2021-07-21: 5 mL via INTRA_ARTERIAL

## 2021-07-21 MED ORDER — HEPARIN SODIUM (PORCINE) 1000 UNIT/ML IJ SOLN
INTRAMUSCULAR | Status: AC
  Filled 2021-07-21: qty 1

## 2021-07-21 MED ORDER — CHLORHEXIDINE GLUCONATE 0.12 % MT SOLN
15.0000 mL | Freq: Once | OROMUCOSAL | Status: AC
  Administered 2021-07-22: 15 mL via OROMUCOSAL
  Filled 2021-07-21: qty 15

## 2021-07-21 MED ORDER — IOHEXOL 350 MG/ML SOLN
INTRAVENOUS | Status: DC | PRN
  Administered 2021-07-21: 50 mL via INTRA_ARTERIAL

## 2021-07-21 MED ORDER — MIDAZOLAM HCL 2 MG/2ML IJ SOLN
INTRAMUSCULAR | Status: AC
  Filled 2021-07-21: qty 2

## 2021-07-21 MED ORDER — HEPARIN 30,000 UNITS/1000 ML (OHS) CELLSAVER SOLUTION
Status: DC
  Filled 2021-07-21: qty 1000

## 2021-07-21 MED ORDER — INSULIN REGULAR(HUMAN) IN NACL 100-0.9 UT/100ML-% IV SOLN
INTRAVENOUS | Status: DC
  Filled 2021-07-21: qty 100

## 2021-07-21 MED ORDER — MILRINONE LACTATE IN DEXTROSE 20-5 MG/100ML-% IV SOLN
0.3000 ug/kg/min | INTRAVENOUS | Status: DC
  Filled 2021-07-21: qty 100

## 2021-07-21 SURGICAL SUPPLY — 11 items
CATH INFINITI 5FR ANG PIGTAIL (CATHETERS) ×1 IMPLANT
CATH OPTITORQUE TIG 4.0 5F (CATHETERS) ×1 IMPLANT
DEVICE RAD COMP TR BAND LRG (VASCULAR PRODUCTS) ×1 IMPLANT
GLIDESHEATH SLEND A-KIT 6F 22G (SHEATH) ×1 IMPLANT
GUIDEWIRE INQWIRE 1.5J.035X260 (WIRE) IMPLANT
INQWIRE 1.5J .035X260CM (WIRE) ×2
KIT HEART LEFT (KITS) ×2 IMPLANT
PACK CARDIAC CATHETERIZATION (CUSTOM PROCEDURE TRAY) ×2 IMPLANT
TRANSDUCER W/STOPCOCK (MISCELLANEOUS) ×2 IMPLANT
TUBING CIL FLEX 10 FLL-RA (TUBING) ×2 IMPLANT
WIRE HI TORQ VERSACORE-J 145CM (WIRE) ×1 IMPLANT

## 2021-07-21 NOTE — Plan of Care (Signed)
  Problem: Education: Goal: Knowledge of General Education information will improve Description: Including pain rating scale, medication(s)/side effects and non-pharmacologic comfort measures Outcome: Progressing   Problem: Clinical Measurements: Goal: Ability to maintain clinical measurements within normal limits will improve Outcome: Progressing Goal: Diagnostic test results will improve Outcome: Progressing   Problem: Activity: Goal: Risk for activity intolerance will decrease Outcome: Progressing   Problem: Coping: Goal: Level of anxiety will decrease Outcome: Progressing

## 2021-07-21 NOTE — Progress Notes (Signed)
ANTICOAGULATION CONSULT NOTE   Pharmacy Consult for Heparin Indication: chest pain/ACS  Allergies  Allergen Reactions   Albumin (Human) Anaphylaxis   Polymyxin B-Trimethoprim Swelling    Eye drops made eyes swell   Pseudoephedrine Other (See Comments)    Stomach cramps   Codeine Hives, Itching and Rash   Guaiacol Other (See Comments)    Hallucinations   Statins Other (See Comments)    Muscle cramps    Gabapentin Other (See Comments)    Pt unsure of sensitivity   Meloxicam Other (See Comments)    Pt unsure of sensitivity   Pseudoephedrine-Guaifenesin Nausea And Vomiting    Stomach cramps   Rosuvastatin Calcium Other (See Comments)    Muscle aches   Tapentadol Other (See Comments)    Pt unsure of sensitivity   Ciprofloxacin Hives, Itching, Nausea Only and Rash   Moxifloxacin Nausea Only and Other (See Comments)    Headaches, stomach cramps    Rofecoxib Other (See Comments)    Stomach cramping    Patient Measurements: Height: 5\' 4"  (162.6 cm) Weight: 79.6 kg (175 lb 7.8 oz) IBW/kg (Calculated) : 59.2 Heparin Dosing Weight: 75.7 kg  Vital Signs: Temp: 98.3 F (36.8 C) (10/17 0623) Temp Source: Oral (10/17 0623) BP: 149/56 (10/17 1705) Pulse Rate: Ryan (10/17 1705)  Labs: Recent Labs    07/20/21 1057 07/20/21 1103 07/20/21 1502 07/20/21 1502 07/20/21 1530 07/20/21 1836 07/20/21 2031 07/21/21 0020 07/21/21 0615 07/21/21 1130 07/21/21 1245  HGB 11.0*  --  10.3*  --   --  10.3*  --  9.4* 9.9*  --   --   HCT 33.7*  --  30.4*  --   --  31.2*  --  27.6* 29.8*  --   --   PLT 197  --  169  --   --   --   --   --  160  --   --   APTT  --  28  --   --   --   --   --   --   --   --   --   LABPROT  --  14.0  --   --   --   --   --   --   --   --   --   INR  --  1.1  --   --   --   --   --   --   --   --   --   CREATININE 1.20  --   --   --  1.17  --   --   --  1.12  --   --   TROPONINIHS  --   --   --    < >  --  27* 19*  --   --  13 24*   < > = values in this  interval not displayed.     Estimated Creatinine Clearance: 55.2 mL/min (by C-G formula based on SCr of 1.12 mg/dL).   Medical History: Past Medical History:  Diagnosis Date   Anemia    Anticoagulant long-term use    changed from eliquis to praxada 06/ 2022--- mananged by cardiology   BPH with urinary obstruction    CAD (coronary artery disease)    cardiologist--- dr g. taylor--- cath 04-22-2011  moderate LM stenosis, borderline sig pRCA, sig stenosis mid to distal LAD ;  aggressive medical therapy   Chronic diastolic CHF (congestive heart failure) ([place]) 03/2018  followed by cardiology   CKD (chronic kidney disease), stage II    COPD (chronic obstructive pulmonary disease) ([place])    followed by pcp   DOE (dyspnea on exertion)    per pt when walk >100yds,  per pt rides outside bike 4-8 miles daily without sob,  household chores and yard work without sob   Eczema    GERD (gastroesophageal reflux disease)    Glaucoma, left eye    Heart murmur    Hemorrhoids    History of adenomatous polyp of colon    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 Bryan Lemma--- dx by biopsy 01-28-2021   Macular degeneration of both eyes    Osteoporosis 05/31/2016   PAF (paroxysmal atrial fibrillation) ([place]) 03/2012   cardiologist-- dr g. taylor;  first dx 06/ 2013 AFlutter w/ RVR   Urethral lesion    Vitamin D deficiency      Medications:  Scheduled:   [START ON 07/22/2021] aspirin  81 mg Oral Pre-Cath   [MAR Hold] brimonidine  1 drop Left Eye Q12H   [MAR Hold] budesonide  6 mg Oral Daily   [MAR Hold] Chlorhexidine Gluconate Cloth  6 each Topical Daily   [MAR Hold] latanoprost  1 drop Left Eye QHS   [MAR Hold] metoprolol tartrate  25 mg Oral BID   morphine       [MAR Hold] propafenone  225 mg Oral BID   sodium chloride flush  3 mL Intravenous Q12H   [MAR Hold] tamsulosin  0.4 mg Oral BID   Infusions:   sodium chloride     sodium chloride 75 mL/hr at 07/21/21 1528   sodium chloride 1 mL/kg/hr (07/21/21 1408)   [MAR Hold] ceFEPime (MAXIPIME) IV 2 g (07/21/21 0904)   heparin 1,000 Units/hr (07/21/21 1202)   nitroGLYCERIN 20 mcg/min (07/21/21 1633)    Assessment:  75 [name] with hx afib on Pradaxa 150mg  bid - held since prior to TURP 10/13, presenting with CP. Patient transitioned to heparin for ACS. S/p cath 10/17 showing left main/three-vessel disease. Planning CABG. Pharmacy consulted to resume heparin 4hrs post-sheath removal (removed at 1500 per cath procedure log). Hg low stable, plt wnl. Hematuria noted but now resolved per MD note. No other bleed or issues with infusion reported.  Goal of Therapy:  Heparin level 0.3-0.7 units/ml Monitor platelets by anticoagulation protocol: Yes   Plan:  No bolus. Resume heparin at 1050 units/hr at 1900 (4hrs post-sheath removal) Check 8hr heparin level from resumption Monitor daily CBC, s/sx bleeding   Arturo Morton, PharmD, BCPS Please check AMION for all [place] contact numbers Clinical Pharmacist 07/21/2021 5:16 PM

## 2021-07-21 NOTE — Progress Notes (Signed)
Dr. Gwenlyn Found in to see patient

## 2021-07-21 NOTE — H&P (View-Only) (Signed)
Cardiology Consultation:   Patient ID: Ryan [name] MRN: [id number]; DOB: 11/06/1945  Admit date: 07/20/2021 Date of Consult: 07/21/2021  PCP:  [name], MD   [facility] Providers Cardiologist:  Cristopher Peru, MD   mild coronary artery disease, BPH, arterial embolus   Patient Profile:   Ryan [name] is a 75 y.o. male with a hx of mild coronary artery disease,  PAF , history of arterial embolus, prostate enlargement  ho is being seen 07/21/2021 for the evaluation of acute coronary syndrome at the request of Dr. [place] Pink. Marland Kitchen  History of Present Illness:   Ryan [name] is a 75 year old gentleman with a history of hypertension, paroxysmal atrial fibrillation, diastolic diastolic dysfunction.  He has mild coronary artery disease.  He was admitted recently with prostatic enlargement and is status post TURP.  He has had some hematuria but this is resolved.  Yesterday he developed some midsternal chest tightness/fullness.  The pain seem to increase with a deep breath.  It was not stabbing but more over for chest pain that continued with intermittent worsening with a deep breath.  The pain eventually resolved.  Today had similar pain that he described as 10/10 chest pressure.  It was associated with some diaphoresis.  The pain lasted for 30 to 45 minutes and now has resolved.  He he does have some residual throat soreness which she attributes to the intubation for his procedure.  During the chest pain episode, EKG revealed acute worsening of his T wave inversion and ST segment depression in the anterior and septal leads.   The intense midsternal chest discomfort has resolved but he still has some throat scratchiness.  He is received aspirin 325 mg chewed, he has received nitroglycerin.  He has been started on a heparin drip.      Past Medical History:  Diagnosis Date   Anemia    Anticoagulant long-term use    changed from eliquis to praxada 06/ 2022--- mananged by  cardiology   BPH with urinary obstruction    CAD (coronary artery disease)    cardiologist--- dr g. taylor--- cath 04-22-2011  moderate LM stenosis, borderline sig pRCA, sig stenosis mid to distal LAD ;  aggressive medical therapy   Chronic diastolic CHF (congestive heart failure) ([place]) 03/2018   followed by cardiology   CKD (chronic kidney disease), stage II    COPD (chronic obstructive pulmonary disease) ([place])    followed by pcp   DOE (dyspnea on exertion)    per pt when walk >100yds,  per pt rides outside bike 4-8 miles daily without sob,  household chores and yard work without sob   Eczema    GERD (gastroesophageal reflux disease)    Glaucoma, left eye    Heart murmur    Hemorrhoids    History of adenomatous polyp of colon    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 Bryan Lemma--- dx by biopsy 01-28-2021   Macular degeneration of both eyes    Osteoporosis 05/31/2016   PAF (paroxysmal atrial fibrillation) ([place]) 03/2012   cardiologist-- dr g. taylor;  first dx 06/ 2013 AFlutter w/ RVR   Urethral lesion    Vitamin D deficiency     Past Surgical History:  Procedure Laterality Date   ANTERIOR CERVICAL DECOMP/DISCECTOMY FUSION  06/08/2002   @[facility] ;  C5--C7   [place]  04/22/2011   moderate left main and RCA stenosis not significant by FFR and IVUS on medical therapy    CARDIOVERSION N/A 04/25/2018   Procedure: CARDIOVERSION;  Surgeon: Skeet Latch, MD;  Location: [place];  Service: Cardiovascular;  Laterality: N/A;   CATARACT EXTRACTION W/ INTRAOCULAR LENS IMPLANT Bilateral 2021   COLONOSCOPY WITH ESOPHAGOGASTRODUODENOSCOPY (EGD)  01/28/2021   by [place] LITHOTRIPSY     x2  1990s   FOOT SURGERY Right 1970   calcification removed from top of foot   KNEE ARTHROSCOPY Right 1991   LAPAROSCOPIC CHOLECYSTECTOMY  2000   TOTAL KNEE ARTHROPLASTY Right 08/14/2009   @[facility]    TRANSURETHRAL RESECTION OF PROSTATE N/A 07/17/2021   Procedure: TRANSURETHRAL RESECTION OF THE PROSTATE (TURP);  Surgeon: [name], MD;  Location: [facility];  Service: Urology;  Laterality: N/A;  1 HR     Home Medications:  Prior to Admission medications   Medication Sig Start Date End Date Taking? Authorizing Provider  amoxicillin (AMOXIL) 500 MG capsule Take 1 capsule (500 mg total) by mouth every 12 (twelve) hours. 07/18/21  Yes Dahlstedt, Annie Main, MD  brimonidine (ALPHAGAN) 0.2 % ophthalmic solution Place 1 drop into the left eye in the morning and at bedtime.  09/10/17  Yes [provider]  budesonide (ENTOCORT EC) 3 MG 24 hr capsule Take 2 capsules (6 mg total) by mouth daily for 42 days, THEN 1 capsule (3 mg total) daily. Patient taking differently: Take 2 capsules (6 mg total) by mouth daily for 42 days, THEN 1 capsule (3 mg total) daily. 07-15-2021 Per pt currently take 2 tabs in am 06/19/21 09/29/21 Yes Cirigliano, Vito V, DO  furosemide (LASIX) 20 MG tablet Take 1 tablet (20 mg total) by mouth daily as needed for edema (weight gain>3#/24 hours). Patient taking differently: Take 20 mg by mouth daily as needed for edema (weight gain>3#/24 hours). 05/16/21  Yes [name], MD  latanoprost (XALATAN) 0.005 % ophthalmic solution Place 1 drop into the left eye at bedtime.    Yes [provider]  levalbuterol  (XOPENEX HFA) 45 MCG/ACT inhaler Inhale 2 puffs into the lungs every 4 (four) hours as needed for wheezing. 06/05/20 07/20/21 Yes Saguier, Percell Miller, PA-C  MAGnesium-Oxide 400 (240 Mg) MG tablet TAKE 1 TABLET(400 MG) BY MOUTH THREE TIMES DAILY AS NEEDED Patient taking differently: Take 400 mg by mouth 2 (two) times daily. 05/26/21  Yes [name], MD  metoprolol tartrate (LOPRESSOR) 25 MG tablet TAKE 1 TABLET BY MOUTH  TWICE DAILY Patient taking differently: Take 25 mg by mouth 2 (two) times daily. 06/11/21  Yes Evans Lance, MD  nitroGLYCERIN (NITROSTAT) 0.4 MG SL tablet Place 1 tablet (0.4 mg total) under the tongue every 5 (five) minutes as needed for chest pain. 04/14/20  Yes Allie Bossier, MD  omeprazole (PRILOSEC OTC) 20 MG tablet Take 20 mg  by mouth daily.   Yes [provider]  potassium chloride (KLOR-CON) 10 MEQ tablet TAKE 1 TABLET BY MOUTH  DAILY Patient taking differently: Take 10 mEq by mouth daily. 06/10/21  Yes Evans Lance, MD  propafenone (RYTHMOL) 225 MG tablet TAKE 1 TABLET BY MOUTH  TWICE DAILY Patient taking differently: Take 225 mg by mouth 2 (two) times daily. 11/12/20  Yes Evans Lance, MD  Ranibizumab (LUCENTIS IO) Inject 1 Dose into the eye once as needed (Macular degeneration).   Yes [provider]  tamsulosin (FLOMAX) 0.4 MG CAPS capsule Take 0.4 mg by mouth 2 (two) times daily.   Yes [provider]  clobetasol (TEMOVATE) 0.05 % external solution APPLY 1 APPLICATION TOPICALLY TWICE DAILY Patient not taking: No sig reported 11/20/16   [name], MD  dabigatran (PRADAXA) 150 MG CAPS capsule Take 1 capsule (150 mg total) by mouth 2 (two) times daily. Patient not taking: No sig reported 05/21/21   Evans Lance, MD  oxybutynin (DITROPAN) 5 MG tablet Take 1 tablet (5 mg total) by mouth 3 (three) times daily as needed for up to 7 days for bladder spasms. 07/19/21 07/26/21  Henderly, Britni A, PA-C    Inpatient Medications: Scheduled  Meds:  brimonidine  1 drop Left Eye Q12H   budesonide  6 mg Oral Daily   Chlorhexidine Gluconate Cloth  6 each Topical Daily   heparin  4,000 Units Intravenous NOW   latanoprost  1 drop Left Eye QHS   metoprolol tartrate  25 mg Oral BID   morphine       propafenone  225 mg Oral BID   tamsulosin  0.4 mg Oral BID   Continuous Infusions:  ceFEPime (MAXIPIME) IV 2 g (07/21/21 0904)   heparin 1,000 Units/hr (07/21/21 1202)   PRN Meds: acetaminophen **OR** acetaminophen, HYDROcodone-acetaminophen, levalbuterol, morphine injection, nitroGLYCERIN, ondansetron **OR** ondansetron (ZOFRAN) IV, oxybutynin  Allergies:    Allergies  Allergen Reactions   Albumin (Human) Anaphylaxis   Polymyxin B-Trimethoprim Swelling    Eye drops made eyes swell   Pseudoephedrine Other (See Comments)    Stomach cramps   Codeine Hives, Itching and Rash   Guaiacol Other (See Comments)    Hallucinations   Statins Other (See Comments)    Muscle cramps    Gabapentin Other (See Comments)    Pt unsure of sensitivity   Meloxicam Other (See Comments)    Pt unsure of sensitivity   Pseudoephedrine-Guaifenesin Nausea And Vomiting    Stomach cramps   Rosuvastatin Calcium Other (See Comments)    Muscle aches   Tapentadol Other (See Comments)    Pt unsure of sensitivity   Ciprofloxacin Hives, Itching, Nausea Only and Rash   Moxifloxacin Nausea Only and Other (See Comments)    Headaches, stomach cramps    Rofecoxib Other (See Comments)    Stomach cramping    Social History:   Social History   Socioeconomic History   Marital status: Married    Spouse name: Thayer Headings   Number of children: 0   Years of education: Not on file   Highest education level: Not on file  Occupational History   Occupation: retired    Fish farm manager: RETIRED  Tobacco Use   Smoking status: Former    Packs/day: 0.50    Years: 50.00    Pack years: 25.00    Types: Cigarettes    Quit date: 05/01/2009    Years since quitting: 12.2    Smokeless tobacco: Former  Types: Sarina Ser    Quit date: 1995  Vaping Use   Vaping Use: Never used  Substance and Sexual Activity   Alcohol use: Yes    Alcohol/week: 1.0 standard drink    Types: 1 Cans of beer per week    Comment: 1 beer daily   Drug use: Never   Sexual activity: Yes    Comment: lives with wife, no dietary restrictions  Other Topics Concern   Not on file  Social History Narrative   Lives with wife   Social Determinants of Health   Financial Resource Strain: Not on file  Food Insecurity: Not on file  Transportation Needs: Not on file  Physical Activity: Not on file  Stress: Not on file  Social Connections: Not on file  Intimate Partner Violence: Not on file    Family History:    Family History  Problem Relation Age of Onset   Heart disease Mother    Diabetes Mother    Cirrhosis Mother    Emphysema Mother        never smoked but 2nd hand through her spouse   Hypertension Mother    Macular degeneration Mother    Heart disease Father    Cancer Father        prostate   Hyperlipidemia Father    Hypertension Father    Varicose Veins Father    Heart attack Father    Peripheral vascular disease Father    Heart disease Sister    Arthritis Sister    Hyperlipidemia Sister    Obesity Sister    Macular degeneration Sister    Heart disease Brother        5 stents   Hyperlipidemia Brother    Macular degeneration Maternal Grandfather    Cirrhosis Sister    Obesity Sister    Arthritis Sister    Heart disease Sister    Obesity Sister    Liver disease Other    Prostate cancer Other    Coronary artery disease Other    Colon cancer Neg Hx    Esophageal cancer Neg Hx    Rectal cancer Neg Hx    Stomach cancer Neg Hx      ROS:  Please see the history of present illness.   All other ROS reviewed and negative.     Physical Exam/Data:   Vitals:   07/21/21 1035 07/21/21 1042 07/21/21 1047 07/21/21 1104  BP: (!) 150/63 (!) 145/74 (!) 158/57 127/62   Pulse: 69  60 (!) 53  Resp: (!) 24  (!) 24 20  Temp:      TempSrc:      SpO2: 100%  100% 100%  Weight:      Height:        Intake/Output Summary (Last 24 hours) at 07/21/2021 1207 Last data filed at 07/21/2021 0900 Gross per 24 hour  Intake 3789.63 ml  Output 1350 ml  Net 2439.63 ml   Last 3 Weights 07/20/2021 07/20/2021 07/17/2021  Weight (lbs) 175 lb 11.3 oz 173 lb 3.2 oz 173 lb 3.2 oz  Weight (kg) 79.7 kg 78.563 kg 78.563 kg     Body mass index is 30.16 kg/m.  General:  elderly male,   mild CP , , mild distress  HEENT: normal Neck: no JVD Vascular: No carotid bruits; Distal pulses 2+ bilaterally Cardiac:  normal S1, S2; RRR; no murmur  Lungs:  clear to auscultation bilaterally, no wheezing, rhonchi or rales  Abd: soft, nontender, no hepatomegaly  Ext: no  edema Musculoskeletal:  No deformities, BUE and BLE strength normal and equal Skin: warm and dry  Neuro:  CNs 2-12 intact, no focal abnormalities noted Psych:  Normal affect   EKG:  The EKG was personally reviewed and demonstrates:   NSR  New , acutely worsened ST depression in leads V2- V6.    Telemetry:  Telemetry was personally reviewed and demonstrates:     Relevant CV Studies:    Laboratory Data:  High Sensitivity Troponin:   Recent Labs  Lab 07/20/21 1836 07/20/21 2031 07/21/21 1130  TROPONINIHS 27* 19* 13     Chemistry Recent Labs  Lab 07/20/21 1057 07/20/21 1530 07/21/21 0615  NA 135 137 137  K 4.0 4.0 4.2  CL 104 106 106  CO2 23 25 28   GLUCOSE 115* 95 92  BUN 22 18 15   CREATININE 1.20 1.17 1.12  CALCIUM 9.1 8.7* 8.8*  GFRNONAA >60 >60 >60  ANIONGAP 8 6 3*    Recent Labs  Lab 07/20/21 1530 07/21/21 0615  PROT  --  5.7*  ALBUMIN 3.1* 3.1*  AST  --  15  ALT  --  12  ALKPHOS  --  48  BILITOT  --  0.8   Lipids No results for input(s): CHOL, TRIG, HDL, LABVLDL, LDLCALC, CHOLHDL in the last 168 hours.  Hematology Recent Labs  Lab 07/20/21 1057 07/20/21 1502 07/20/21 1836  07/21/21 0020 07/21/21 0615  WBC 9.3 8.5  --   --  8.7  RBC 3.59* 3.24*  --   --  3.13*  HGB 11.0* 10.3* 10.3* 9.4* 9.9*  HCT 33.7* 30.4* 31.2* 27.6* 29.8*  MCV 93.9 93.8  --   --  95.2  MCH 30.6 31.8  --   --  31.6  MCHC 32.6 33.9  --   --  33.2  RDW 12.6 12.6  --   --  12.8  PLT 197 169  --   --  160   Thyroid No results for input(s): TSH, FREET4 in the last 168 hours.  BNPNo results for input(s): BNP, PROBNP in the last 168 hours.  DDimer No results for input(s): DDIMER in the last 168 hours.   Radiology/Studies:  DG CHEST PORT 1 VIEW  Result Date: 07/21/2021 CLINICAL DATA:  Mid anterior chest pain EXAM: PORTABLE CHEST 1 VIEW COMPARISON:  Chest radiograph 07/20/2021 FINDINGS: The cardiomediastinal silhouette is stable. The central pulmonary vasculature is prominent, unchanged. Patchy opacities in the right base are increased in conspicuity. Otherwise, there is no focal consolidation or pulmonary edema. There is no pleural effusion or pneumothorax. The bones are stable. IMPRESSION: Slightly increased patchy opacities in the right base may reflect atelectasis or infection. Electronically Signed   By: Valetta Mole M.D.   On: 07/21/2021 11:25   DG Chest Port 1 View  Result Date: 07/20/2021 CLINICAL DATA:  Questionable sepsis, weakness EXAM: PORTABLE CHEST 1 VIEW COMPARISON:  03/27/2021 FINDINGS: Cardiomegaly. Both lungs are clear. The visualized skeletal structures are unremarkable. IMPRESSION: Cardiomegaly without acute abnormality of the lungs in AP portable projection. Electronically Signed   By: Delanna Ahmadi M.D.   On: 07/20/2021 11:43     Assessment and Plan:   Probable acute coronary syndrome: Ryan [name] developed acute chest discomfort this morning associated with deep ST segment depression.  He had a similar episode yesterday that resolved on its own.  He has known mild to moderate coronary artery disease.  The episode was associated with some diaphoresis.  The pain has  resolved  with nitroglycerin and aspirin.  We will start him on IV heparin.  I discussed urgent heart catheterization with possible angioplasty with the patient.  We discussed the risk, benefits, options.  He understands and agrees to proceed. I have called the Cath Lab and they will be arranging for urgent heart catheterization this afternoon.  Recent TURP: His urine is clear at the time being.  I discussed this with the patient and there is a likelihood that he may develop some recurrent hematuria when he has his heart catheterization.      Risk Assessment/Risk Scores:     TIMI Risk Score for Unstable Angina or Non-ST Elevation MI:   The patient's TIMI risk score is 5, which indicates a 26% risk of all cause mortality, new or recurrent myocardial infarction or need for urgent revascularization in the next 14 days.        For questions or updates, please contact [place] Please consult [email or web address] for contact info under    Signed, Mertie Moores, MD  07/21/2021 12:07 PM

## 2021-07-21 NOTE — Anesthesia Preprocedure Evaluation (Addendum)
Anesthesia Evaluation  Patient identified by MRN, date of birth, ID band Patient awake    Reviewed: Allergy & Precautions, NPO status , Patient's Chart, lab work & pertinent test results, reviewed documented beta blocker date and time   Airway Mallampati: III  TM Distance: >3 FB Neck ROM: Full    Dental  (+) Dental Advisory Given, Missing,    Pulmonary sleep apnea , COPD, former smoker,    Pulmonary exam normal breath sounds clear to auscultation       Cardiovascular hypertension, Pt. on home beta blockers and Pt. on medications + CAD, + Peripheral Vascular Disease, +CHF and + DOE  Normal cardiovascular exam+ dysrhythmias Atrial Fibrillation  Rhythm:Regular Rate:Normal     Neuro/Psych PSYCHIATRIC DISORDERS Anxiety  Neuromuscular disease CVA    GI/Hepatic Neg liver ROS, GERD  Medicated,  Endo/Other  Obesity   Renal/GU negative Renal ROS     Musculoskeletal negative musculoskeletal ROS (+)   Abdominal   Peds  Hematology  (+) Blood dyscrasia (Pradaxa), anemia ,   Anesthesia Other Findings   Reproductive/Obstetrics                           Anesthesia Physical Anesthesia Plan  ASA: 4  Anesthesia Plan: General   Post-op Pain Management:    Induction: Intravenous  PONV Risk Score and Plan: 2 and Treatment may vary due to age or medical condition and Midazolam  Airway Management Planned: Oral ETT  Additional Equipment: Arterial line, CVP, PA Cath, TEE and Ultrasound Guidance Line Placement  Intra-op Plan:   Post-operative Plan: Post-operative intubation/ventilation  Informed Consent: I have reviewed the patients History and Physical, chart, labs and discussed the procedure including the risks, benefits and alternatives for the proposed anesthesia with the patient or authorized representative who has indicated his/her understanding and acceptance.     Dental advisory given  Plan  Discussed with: CRNA  Anesthesia Plan Comments:        Anesthesia Quick Evaluation

## 2021-07-21 NOTE — Consult Note (Signed)
Cardiology Consultation:   Patient ID: Ryan [name] MRN: [id number]; DOB: Aug 15, 1946  Admit date: 07/20/2021 Date of Consult: 07/21/2021  PCP:  [name], MD   [facility] Providers Cardiologist:  Cristopher Peru, MD   mild coronary artery disease, BPH, arterial embolus   Patient Profile:   Ryan [name] is a 75 y.o. male with a hx of mild coronary artery disease,  PAF , history of arterial embolus, prostate enlargement  ho is being seen 07/21/2021 for the evaluation of acute coronary syndrome at the request of Dr. [place] Pink. Marland Kitchen  History of Present Illness:   Ryan [name] is a 75 year old gentleman with a history of hypertension, paroxysmal atrial fibrillation, diastolic diastolic dysfunction.  He has mild coronary artery disease.  He was admitted recently with prostatic enlargement and is status post TURP.  He has had some hematuria but this is resolved.  Yesterday he developed some midsternal chest tightness/fullness.  The pain seem to increase with a deep breath.  It was not stabbing but more over for chest pain that continued with intermittent worsening with a deep breath.  The pain eventually resolved.  Today had similar pain that he described as 10/10 chest pressure.  It was associated with some diaphoresis.  The pain lasted for 30 to 45 minutes and now has resolved.  He he does have some residual throat soreness which she attributes to the intubation for his procedure.  During the chest pain episode, EKG revealed acute worsening of his T wave inversion and ST segment depression in the anterior and septal leads.   The intense midsternal chest discomfort has resolved but he still has some throat scratchiness.  He is received aspirin 325 mg chewed, he has received nitroglycerin.  He has been started on a heparin drip.      Past Medical History:  Diagnosis Date   Anemia    Anticoagulant long-term use    changed from eliquis to praxada 06/ 2022--- mananged by  cardiology   BPH with urinary obstruction    CAD (coronary artery disease)    cardiologist--- dr g. taylor--- cath 04-22-2011  moderate LM stenosis, borderline sig pRCA, sig stenosis mid to distal LAD ;  aggressive medical therapy   Chronic diastolic CHF (congestive heart failure) ([place]) 03/2018   followed by cardiology   CKD (chronic kidney disease), stage II    COPD (chronic obstructive pulmonary disease) ([place])    followed by pcp   DOE (dyspnea on exertion)    per pt when walk >100yds,  per pt rides outside bike 4-8 miles daily without sob,  household chores and yard work without sob   Eczema    GERD (gastroesophageal reflux disease)    Glaucoma, left eye    Heart murmur    Hemorrhoids    History of adenomatous polyp of colon    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 Bryan Lemma--- dx by biopsy 01-28-2021   Macular degeneration of both eyes    Osteoporosis 05/31/2016   PAF (paroxysmal atrial fibrillation) ([place]) 03/2012   cardiologist-- dr g. taylor;  first dx 06/ 2013 AFlutter w/ RVR   Urethral lesion    Vitamin D deficiency     Past Surgical History:  Procedure Laterality Date   ANTERIOR CERVICAL DECOMP/DISCECTOMY FUSION  06/08/2002   @[facility] ;  C5--C7   [place]  04/22/2011   moderate left main and RCA stenosis not significant by FFR and IVUS on medical therapy    CARDIOVERSION N/A 04/25/2018   Procedure: CARDIOVERSION;  Surgeon: Skeet Latch, MD;  Location: [place];  Service: Cardiovascular;  Laterality: N/A;   CATARACT EXTRACTION W/ INTRAOCULAR LENS IMPLANT Bilateral 2021   COLONOSCOPY WITH ESOPHAGOGASTRODUODENOSCOPY (EGD)  01/28/2021   by [place] LITHOTRIPSY     x2  1990s   FOOT SURGERY Right 1970   calcification removed from top of foot   KNEE ARTHROSCOPY Right 1991   LAPAROSCOPIC CHOLECYSTECTOMY  2000   TOTAL KNEE ARTHROPLASTY Right 08/14/2009   @[facility]    TRANSURETHRAL RESECTION OF PROSTATE N/A 07/17/2021   Procedure: TRANSURETHRAL RESECTION OF THE PROSTATE (TURP);  Surgeon: [name], MD;  Location: [facility];  Service: Urology;  Laterality: N/A;  1 HR     Home Medications:  Prior to Admission medications   Medication Sig Start Date End Date Taking? Authorizing Provider  amoxicillin (AMOXIL) 500 MG capsule Take 1 capsule (500 mg total) by mouth every 12 (twelve) hours. 07/18/21  Yes Dahlstedt, Annie Main, MD  brimonidine (ALPHAGAN) 0.2 % ophthalmic solution Place 1 drop into the left eye in the morning and at bedtime.  09/10/17  Yes [provider]  budesonide (ENTOCORT EC) 3 MG 24 hr capsule Take 2 capsules (6 mg total) by mouth daily for 42 days, THEN 1 capsule (3 mg total) daily. Patient taking differently: Take 2 capsules (6 mg total) by mouth daily for 42 days, THEN 1 capsule (3 mg total) daily. 07-15-2021 Per pt currently take 2 tabs in am 06/19/21 09/29/21 Yes Cirigliano, Vito V, DO  furosemide (LASIX) 20 MG tablet Take 1 tablet (20 mg total) by mouth daily as needed for edema (weight gain>3#/24 hours). Patient taking differently: Take 20 mg by mouth daily as needed for edema (weight gain>3#/24 hours). 05/16/21  Yes [name], MD  latanoprost (XALATAN) 0.005 % ophthalmic solution Place 1 drop into the left eye at bedtime.    Yes [provider]  levalbuterol  (XOPENEX HFA) 45 MCG/ACT inhaler Inhale 2 puffs into the lungs every 4 (four) hours as needed for wheezing. 06/05/20 07/20/21 Yes Saguier, Percell Miller, PA-C  MAGnesium-Oxide 400 (240 Mg) MG tablet TAKE 1 TABLET(400 MG) BY MOUTH THREE TIMES DAILY AS NEEDED Patient taking differently: Take 400 mg by mouth 2 (two) times daily. 05/26/21  Yes [name], MD  metoprolol tartrate (LOPRESSOR) 25 MG tablet TAKE 1 TABLET BY MOUTH  TWICE DAILY Patient taking differently: Take 25 mg by mouth 2 (two) times daily. 06/11/21  Yes Evans Lance, MD  nitroGLYCERIN (NITROSTAT) 0.4 MG SL tablet Place 1 tablet (0.4 mg total) under the tongue every 5 (five) minutes as needed for chest pain. 04/14/20  Yes Allie Bossier, MD  omeprazole (PRILOSEC OTC) 20 MG tablet Take 20 mg  by mouth daily.   Yes [provider]  potassium chloride (KLOR-CON) 10 MEQ tablet TAKE 1 TABLET BY MOUTH  DAILY Patient taking differently: Take 10 mEq by mouth daily. 06/10/21  Yes Evans Lance, MD  propafenone (RYTHMOL) 225 MG tablet TAKE 1 TABLET BY MOUTH  TWICE DAILY Patient taking differently: Take 225 mg by mouth 2 (two) times daily. 11/12/20  Yes Evans Lance, MD  Ranibizumab (LUCENTIS IO) Inject 1 Dose into the eye once as needed (Macular degeneration).   Yes [provider]  tamsulosin (FLOMAX) 0.4 MG CAPS capsule Take 0.4 mg by mouth 2 (two) times daily.   Yes [provider]  clobetasol (TEMOVATE) 0.05 % external solution APPLY 1 APPLICATION TOPICALLY TWICE DAILY Patient not taking: No sig reported 11/20/16   [name], MD  dabigatran (PRADAXA) 150 MG CAPS capsule Take 1 capsule (150 mg total) by mouth 2 (two) times daily. Patient not taking: No sig reported 05/21/21   Evans Lance, MD  oxybutynin (DITROPAN) 5 MG tablet Take 1 tablet (5 mg total) by mouth 3 (three) times daily as needed for up to 7 days for bladder spasms. 07/19/21 07/26/21  Henderly, Britni A, PA-C    Inpatient Medications: Scheduled  Meds:  brimonidine  1 drop Left Eye Q12H   budesonide  6 mg Oral Daily   Chlorhexidine Gluconate Cloth  6 each Topical Daily   heparin  4,000 Units Intravenous NOW   latanoprost  1 drop Left Eye QHS   metoprolol tartrate  25 mg Oral BID   morphine       propafenone  225 mg Oral BID   tamsulosin  0.4 mg Oral BID   Continuous Infusions:  ceFEPime (MAXIPIME) IV 2 g (07/21/21 0904)   heparin 1,000 Units/hr (07/21/21 1202)   PRN Meds: acetaminophen **OR** acetaminophen, HYDROcodone-acetaminophen, levalbuterol, morphine injection, nitroGLYCERIN, ondansetron **OR** ondansetron (ZOFRAN) IV, oxybutynin  Allergies:    Allergies  Allergen Reactions   Albumin (Human) Anaphylaxis   Polymyxin B-Trimethoprim Swelling    Eye drops made eyes swell   Pseudoephedrine Other (See Comments)    Stomach cramps   Codeine Hives, Itching and Rash   Guaiacol Other (See Comments)    Hallucinations   Statins Other (See Comments)    Muscle cramps    Gabapentin Other (See Comments)    Pt unsure of sensitivity   Meloxicam Other (See Comments)    Pt unsure of sensitivity   Pseudoephedrine-Guaifenesin Nausea And Vomiting    Stomach cramps   Rosuvastatin Calcium Other (See Comments)    Muscle aches   Tapentadol Other (See Comments)    Pt unsure of sensitivity   Ciprofloxacin Hives, Itching, Nausea Only and Rash   Moxifloxacin Nausea Only and Other (See Comments)    Headaches, stomach cramps    Rofecoxib Other (See Comments)    Stomach cramping    Social History:   Social History   Socioeconomic History   Marital status: Married    Spouse name: Thayer Headings   Number of children: 0   Years of education: Not on file   Highest education level: Not on file  Occupational History   Occupation: retired    Fish farm manager: RETIRED  Tobacco Use   Smoking status: Former    Packs/day: 0.50    Years: 50.00    Pack years: 25.00    Types: Cigarettes    Quit date: 05/01/2009    Years since quitting: 12.2    Smokeless tobacco: Former  Types: Sarina Ser    Quit date: 1995  Vaping Use   Vaping Use: Never used  Substance and Sexual Activity   Alcohol use: Yes    Alcohol/week: 1.0 standard drink    Types: 1 Cans of beer per week    Comment: 1 beer daily   Drug use: Never   Sexual activity: Yes    Comment: lives with wife, no dietary restrictions  Other Topics Concern   Not on file  Social History Narrative   Lives with wife   Social Determinants of Health   Financial Resource Strain: Not on file  Food Insecurity: Not on file  Transportation Needs: Not on file  Physical Activity: Not on file  Stress: Not on file  Social Connections: Not on file  Intimate Partner Violence: Not on file    Family History:    Family History  Problem Relation Age of Onset   Heart disease Mother    Diabetes Mother    Cirrhosis Mother    Emphysema Mother        never smoked but 2nd hand through her spouse   Hypertension Mother    Macular degeneration Mother    Heart disease Father    Cancer Father        prostate   Hyperlipidemia Father    Hypertension Father    Varicose Veins Father    Heart attack Father    Peripheral vascular disease Father    Heart disease Sister    Arthritis Sister    Hyperlipidemia Sister    Obesity Sister    Macular degeneration Sister    Heart disease Brother        5 stents   Hyperlipidemia Brother    Macular degeneration Maternal Grandfather    Cirrhosis Sister    Obesity Sister    Arthritis Sister    Heart disease Sister    Obesity Sister    Liver disease Other    Prostate cancer Other    Coronary artery disease Other    Colon cancer Neg Hx    Esophageal cancer Neg Hx    Rectal cancer Neg Hx    Stomach cancer Neg Hx      ROS:  Please see the history of present illness.   All other ROS reviewed and negative.     Physical Exam/Data:   Vitals:   07/21/21 1035 07/21/21 1042 07/21/21 1047 07/21/21 1104  BP: (!) 150/63 (!) 145/74 (!) 158/57 127/62   Pulse: 69  60 (!) 53  Resp: (!) 24  (!) 24 20  Temp:      TempSrc:      SpO2: 100%  100% 100%  Weight:      Height:        Intake/Output Summary (Last 24 hours) at 07/21/2021 1207 Last data filed at 07/21/2021 0900 Gross per 24 hour  Intake 3789.63 ml  Output 1350 ml  Net 2439.63 ml   Last 3 Weights 07/20/2021 07/20/2021 07/17/2021  Weight (lbs) 175 lb 11.3 oz 173 lb 3.2 oz 173 lb 3.2 oz  Weight (kg) 79.7 kg 78.563 kg 78.563 kg     Body mass index is 30.16 kg/m.  General:  elderly male,   mild CP , , mild distress  HEENT: normal Neck: no JVD Vascular: No carotid bruits; Distal pulses 2+ bilaterally Cardiac:  normal S1, S2; RRR; no murmur  Lungs:  clear to auscultation bilaterally, no wheezing, rhonchi or rales  Abd: soft, nontender, no hepatomegaly  Ext: no  edema Musculoskeletal:  No deformities, BUE and BLE strength normal and equal Skin: warm and dry  Neuro:  CNs 2-12 intact, no focal abnormalities noted Psych:  Normal affect   EKG:  The EKG was personally reviewed and demonstrates:   NSR  New , acutely worsened ST depression in leads V2- V6.    Telemetry:  Telemetry was personally reviewed and demonstrates:     Relevant CV Studies:    Laboratory Data:  High Sensitivity Troponin:   Recent Labs  Lab 07/20/21 1836 07/20/21 2031 07/21/21 1130  TROPONINIHS 27* 19* 13     Chemistry Recent Labs  Lab 07/20/21 1057 07/20/21 1530 07/21/21 0615  NA 135 137 137  K 4.0 4.0 4.2  CL 104 106 106  CO2 23 25 28   GLUCOSE 115* 95 92  BUN 22 18 15   CREATININE 1.20 1.17 1.12  CALCIUM 9.1 8.7* 8.8*  GFRNONAA >60 >60 >60  ANIONGAP 8 6 3*    Recent Labs  Lab 07/20/21 1530 07/21/21 0615  PROT  --  5.7*  ALBUMIN 3.1* 3.1*  AST  --  15  ALT  --  12  ALKPHOS  --  48  BILITOT  --  0.8   Lipids No results for input(s): CHOL, TRIG, HDL, LABVLDL, LDLCALC, CHOLHDL in the last 168 hours.  Hematology Recent Labs  Lab 07/20/21 1057 07/20/21 1502 07/20/21 1836  07/21/21 0020 07/21/21 0615  WBC 9.3 8.5  --   --  8.7  RBC 3.59* 3.24*  --   --  3.13*  HGB 11.0* 10.3* 10.3* 9.4* 9.9*  HCT 33.7* 30.4* 31.2* 27.6* 29.8*  MCV 93.9 93.8  --   --  95.2  MCH 30.6 31.8  --   --  31.6  MCHC 32.6 33.9  --   --  33.2  RDW 12.6 12.6  --   --  12.8  PLT 197 169  --   --  160   Thyroid No results for input(s): TSH, FREET4 in the last 168 hours.  BNPNo results for input(s): BNP, PROBNP in the last 168 hours.  DDimer No results for input(s): DDIMER in the last 168 hours.   Radiology/Studies:  DG CHEST PORT 1 VIEW  Result Date: 07/21/2021 CLINICAL DATA:  Mid anterior chest pain EXAM: PORTABLE CHEST 1 VIEW COMPARISON:  Chest radiograph 07/20/2021 FINDINGS: The cardiomediastinal silhouette is stable. The central pulmonary vasculature is prominent, unchanged. Patchy opacities in the right base are increased in conspicuity. Otherwise, there is no focal consolidation or pulmonary edema. There is no pleural effusion or pneumothorax. The bones are stable. IMPRESSION: Slightly increased patchy opacities in the right base may reflect atelectasis or infection. Electronically Signed   By: Valetta Mole M.D.   On: 07/21/2021 11:25   DG Chest Port 1 View  Result Date: 07/20/2021 CLINICAL DATA:  Questionable sepsis, weakness EXAM: PORTABLE CHEST 1 VIEW COMPARISON:  03/27/2021 FINDINGS: Cardiomegaly. Both lungs are clear. The visualized skeletal structures are unremarkable. IMPRESSION: Cardiomegaly without acute abnormality of the lungs in AP portable projection. Electronically Signed   By: Delanna Ahmadi M.D.   On: 07/20/2021 11:43     Assessment and Plan:   Probable acute coronary syndrome: Ryan [name] developed acute chest discomfort this morning associated with deep ST segment depression.  He had a similar episode yesterday that resolved on its own.  He has known mild to moderate coronary artery disease.  The episode was associated with some diaphoresis.  The pain has  resolved  with nitroglycerin and aspirin.  We will start him on IV heparin.  I discussed urgent heart catheterization with possible angioplasty with the patient.  We discussed the risk, benefits, options.  He understands and agrees to proceed. I have called the Cath Lab and they will be arranging for urgent heart catheterization this afternoon.  Recent TURP: His urine is clear at the time being.  I discussed this with the patient and there is a likelihood that he may develop some recurrent hematuria when he has his heart catheterization.      Risk Assessment/Risk Scores:     TIMI Risk Score for Unstable Angina or Non-ST Elevation MI:   The patient's TIMI risk score is 5, which indicates a 26% risk of all cause mortality, new or recurrent myocardial infarction or need for urgent revascularization in the next 14 days.        For questions or updates, please contact [place] Please consult [email or web address] for contact info under    Signed, Mertie Moores, MD  07/21/2021 12:07 PM

## 2021-07-21 NOTE — Interval H&P Note (Signed)
Cath Lab Visit (complete for each Cath Lab visit)  Clinical Evaluation Leading to the Procedure:   ACS: Yes.    Non-ACS:    Anginal Classification: CCS III  Anti-ischemic medical therapy: Minimal Therapy (1 class of medications)  Non-Invasive Test Results: No non-invasive testing performed  Prior CABG: No previous CABG      History and Physical Interval Note:  07/21/2021 2:28 PM  Ryan [name]  has presented today for surgery, with the diagnosis of chest pain.  The various methods of treatment have been discussed with the patient and family. After consideration of risks, benefits and other options for treatment, the patient has consented to  Procedure(s): LEFT HEART CATH AND CORONARY ANGIOGRAPHY (N/A) as a surgical intervention.  The patient's history has been reviewed, patient examined, no change in status, stable for surgery.  I have reviewed the patient's chart and labs.  Questions were answered to the patient's satisfaction.     Quay Burow

## 2021-07-21 NOTE — Consult Note (Signed)
[address]       [place] [address]             [facility] Record #[id number] Date of Birth: 1945-12-13  Referring: Dr. Gwenlyn Found, MD Primary Care: [name], MD Primary Cardiologist:Gregg Lovena Le, MD  Chief Complaint:    Chief Complaint  Patient presents with   Fever   Fatigue  Reason for consultation:Coronary artery disease (significant LM disease included)  History of Present Illness:     This is a 75 year old male with a past medical history of CAD (known moderate left main disease since 2012, borderline significant pRCA, and significant mid to distal LAD stenosis), hypertension, CKD, COPD (chronic obstructive pulmonary disease) (HCC), chronic diastolic CHF, TIA , PAF (paroxysmal atrial fibrillation, on Dabigatran), lumbar radiculopathy (left), and BPH/obstruction who on 07/17/2021 underwent a cystoscopy/TURP by Dr. Diona Fanti. He was discharged on 10/14 but returned to the [facility] ED on 07/19/2021 with complaints of urinary retention and hematuria. He had a foley placed and was discharged and instructed to follow up with urologist for voiding trial the following week. However, he did not feel well (weak and had complaints of a fever, bladder spasms and further hematuria) so he returned to [facility] ED on 07/20/2021. He was placed on Cefepime. Urology was consulted. Per note, foley was draining well without significant hematuria and his H/H was stable from prior visits. UA showed bacteria but no nitrites. Oxybutynin was recommended for bladder spasms. Patient then developed chest pain (and later diaphoresis) early on 07/21/2021. Cardiology was consulted. This was felt to be acute coronary syndrome.  Apparently, EKG showed acute worsening of T wave inversion and ST segment depression in the anterior and septal leads. He received aspirin, Nitroglycerin, and he was placed on Heparin drip. Patient was transferred to [facility] for  further evaluation.  Cardiac catheterization showed ostial left main disease 90% stenosed, ostial to proximal Circumflex with a 60% stenosis, proximal RCA with a 70% stenosis, and RPDA with a 70% stenosis. Dr. Roxan Hockey has been consulted regarding consideration for coronary artery disease.  On further questioning has been having shortness of breath and chest tightness when riding his bike for several months.  Current Activity/ Functional Status: Patient is independent with mobility/ambulation, transfers, ADL's, IADL's.   Zubrod Score: At the time of surgery this patient's most appropriate activity status/level should be described as: []     0    Normal activity, no symptoms [x]     1    Restricted in physical strenuous activity but ambulatory, able to do out light work []     2    Ambulatory and capable of self care, unable to do work activities, up and about                 more than 50%  Of the time                            []     3    Only limited self care, in bed greater than 50% of waking hours []     4    Completely disabled, no self care, confined to bed or chair []     5    Moribund  Past Medical History:  Diagnosis Date   Anemia    Anticoagulant long-term use    changed from eliquis  to praxada 06/ 2022--- mananged by cardiology   BPH with urinary obstruction    CAD (coronary artery disease)    cardiologist--- dr g. taylor--- cath 04-22-2011  moderate LM stenosis, borderline sig pRCA, sig stenosis mid to distal LAD ;  aggressive medical therapy   Chronic diastolic CHF (congestive heart failure) ([place]) 03/2018   followed by cardiology   CKD (chronic kidney disease), stage II    COPD (chronic obstructive pulmonary disease) ([place])    followed by pcp   DOE (dyspnea on exertion)    per pt when walk >100yds,  per pt rides outside bike 4-8 miles daily without sob,  household chores and yard work without sob   Eczema    GERD (gastroesophageal reflux disease)    Glaucoma, left eye     Heart murmur    Hemorrhoids    History of adenomatous polyp of colon    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 Bryan Lemma--- dx by biopsy 01-28-2021   Macular degeneration of both eyes    Osteoporosis 05/31/2016   PAF (paroxysmal atrial fibrillation) ([place]) 03/2012   cardiologist-- dr g. taylor;  first dx 06/ 2013 AFlutter w/ RVR   Urethral lesion    Vitamin D deficiency     Past Surgical History:  Procedure Laterality Date   ANTERIOR CERVICAL DECOMP/DISCECTOMY FUSION  06/08/2002   @[facility] ;  C5--C7   [place]  04/22/2011   moderate left main and RCA stenosis not significant by FFR and IVUS on medical therapy   CARDIOVERSION N/A 04/25/2018   Procedure: CARDIOVERSION;  Surgeon: Skeet Latch, MD;  Location: [facility];  Service: Cardiovascular;  Laterality: N/A;   CATARACT EXTRACTION W/ INTRAOCULAR LENS IMPLANT Bilateral 2021   COLONOSCOPY WITH ESOPHAGOGASTRODUODENOSCOPY (EGD)  01/28/2021   by [email or web address]   [place] LITHOTRIPSY     x2  1990s   FOOT SURGERY Right 1970   calcification removed from top of foot   KNEE ARTHROSCOPY Right 1991   LAPAROSCOPIC CHOLECYSTECTOMY  2000   TOTAL KNEE ARTHROPLASTY Right 08/14/2009   @[facility]    TRANSURETHRAL RESECTION OF PROSTATE N/A 07/17/2021   Procedure: TRANSURETHRAL RESECTION OF THE PROSTATE (TURP);  Surgeon: [name], MD;  Location: [facility];  Service: Urology;  Laterality: N/A;  1 HR    Social History   Tobacco Use  Smoking Status Former   Packs/day: 0.50   Years: 50.00   Pack years: 25.00   Types: Cigarettes   Quit date: 05/01/2009   Years since quitting: 12.2  Smokeless Tobacco Former   Types: Chew   Quit date: 1995    Social History   Substance and Sexual Activity  Alcohol Use Yes   Alcohol/week: 2.0 standard drinks   Types:  2 Cans of beer per week   Comment: 1 beer daily     Allergies  Allergen Reactions   Albumin (Human) Anaphylaxis   Polymyxin B-Trimethoprim Swelling    Eye drops made eyes swell   Pseudoephedrine Other (See Comments)    Stomach cramps   Codeine Hives, Itching and Rash   Guaiacol Other (See Comments)    Hallucinations   Statins Other (See Comments)    Muscle cramps    Gabapentin Other (See Comments)    Pt unsure of sensitivity   Meloxicam Other (See Comments)    Pt unsure of sensitivity   Pseudoephedrine-Guaifenesin Nausea And Vomiting    Stomach cramps   Rosuvastatin Calcium Other (See Comments)    Muscle aches   Tapentadol Other (See Comments)    Pt unsure of sensitivity   Ciprofloxacin Hives, Itching, Nausea Only and Rash   Moxifloxacin Nausea Only and Other (See Comments)    Headaches, stomach cramps    Rofecoxib Other (See Comments)    Stomach cramping    Current Facility-Administered Medications  Medication Dose Route Frequency Provider Last Rate Last Admin   0.9 %  sodium chloride infusion  250 mL Intravenous PRN Nahser, Wonda Cheng, MD       0.9 %  sodium chloride infusion   Intravenous Continuous Lorretta Harp, MD 75 mL/hr at 07/21/21 1528 Rate Change at 07/21/21 1528   0.9% sodium chloride infusion  1 mL/kg/hr Intravenous Continuous Sherren Mocha, MD 79.7 mL/hr at 07/21/21 1408 1 mL/kg/hr at 07/21/21 1408   [MAR Hold] acetaminophen (TYLENOL) tablet 650 mg  650 mg Oral Q6H PRN Marylyn Ishihara, Tyrone A, DO       Or    [MAR Hold] acetaminophen (TYLENOL) suppository 650 mg  650 mg Rectal Q6H PRN Marylyn Ishihara, Tyrone A, DO       [START ON 07/22/2021] aspirin chewable tablet 81 mg  81 mg Oral Pre-Cath Nahser, Wonda Cheng, MD       [MAR Hold] brimonidine (ALPHAGAN) 0.2 % ophthalmic solution 1 drop  1 drop Left Eye Q12H Kyle, Tyrone A, DO   1 drop at 07/21/21 0905   [MAR Hold] budesonide (ENTOCORT EC) 24 hr capsule 6 mg  6 mg Oral Daily Kyle, Tyrone A, DO   6 mg at 07/21/21 0901   [MAR Hold] ceFEPIme (MAXIPIME) 2 g in sodium chloride 0.9 % 100 mL IVPB  2 g Intravenous Q12H Kyle, Tyrone A, DO 200 mL/hr at 07/21/21 0904 2 g at 07/21/21 0904   [MAR Hold] Chlorhexidine Gluconate Cloth 2 % PADS 6 each  6 each Topical Daily Kyle, Tyrone A, DO   6 each at 07/21/21 0905   fentaNYL (SUBLIMAZE) injection    PRN Lorretta Harp, MD   25 mcg at 07/21/21 1436   Heparin (Porcine) in NaCl 1000-0.9 UT/500ML-% SOLN    PRN Lorretta Harp, MD   500 mL at 07/21/21 1436   heparin ADULT infusion 100 units/mL (25000 units/258mL)  1,000 Units/hr Intravenous Continuous Minda Ditto, RPH 10 mL/hr at 07/21/21 1202 1,000 Units/hr at 07/21/21 1202   heparin sodium (porcine) injection    PRN Lorretta Harp, MD   4,000 Units at 07/21/21 1448   [MAR Hold] HYDROcodone-acetaminophen (NORCO/VICODIN) 5-325 MG per tablet 1 tablet  1 tablet Oral Q6H PRN Bonnielee Haff, MD       iohexol (OMNIPAQUE) 350 MG/ML injection    PRN Lorretta Harp, MD   50 mL at 07/21/21 1458   [MAR Hold]  latanoprost (XALATAN) 0.005 % ophthalmic solution 1 drop  1 drop Left Eye QHS Kyle, Tyrone A, DO   1 drop at 07/20/21 2259   [MAR Hold] levalbuterol (XOPENEX) nebulizer solution 2.5 mg  2.5 mg Inhalation Q4H PRN Marylyn Ishihara, Tyrone A, DO       lidocaine (PF) (XYLOCAINE) 1 % injection    PRN Lorretta Harp, MD   2 mL at 07/21/21 1443   [MAR Hold] metoprolol tartrate (LOPRESSOR) tablet 25 mg  25 mg Oral BID Marylyn Ishihara, Tyrone A, DO       midazolam (VERSED) injection    PRN Lorretta Harp, MD   1 mg at 07/21/21 1436   [MAR Hold] morphine 2 MG/ML injection 2 mg  2 mg Intravenous Q2H PRN Bonnielee Haff, MD   2 mg at 07/21/21 1223   morphine 2 MG/ML injection            [MAR Hold] nitroGLYCERIN (NITROSTAT) SL tablet 0.4 mg  0.4 mg Sublingual Q5 min PRN Bonnielee Haff, MD   0.4 mg at 07/21/21 1221   [MAR Hold] ondansetron (ZOFRAN) tablet 4 mg  4 mg Oral Q6H PRN Marylyn Ishihara, Tyrone A, DO       Or   [MAR Hold] ondansetron (ZOFRAN) injection 4 mg  4 mg Intravenous Q6H PRN Marylyn Ishihara, Tyrone A, DO       [MAR Hold] oxybutynin (DITROPAN) tablet 5 mg  5 mg Oral TID PRN Marylyn Ishihara, Tyrone A, DO   5 mg at 07/20/21 1836   [MAR Hold] propafenone (RYTHMOL) tablet 225 mg  225 mg Oral BID Marylyn Ishihara, Tyrone A, DO   225 mg at 07/21/21 0901   Radial Cocktail (Verapamil 5 mg, NTG, Lidocaine)    PRN Lorretta Harp, MD   5 mL at 07/21/21 1447   sodium chloride flush (NS) 0.9 % injection 3 mL  3 mL Intravenous Q12H Nahser, Wonda Cheng, MD   3 mL at 07/21/21 1230   sodium chloride flush (NS) 0.9 % injection 3 mL  3 mL Intravenous PRN Nahser, Wonda Cheng, MD       [MAR Hold] tamsulosin [facility]) capsule 0.4 mg  0.4 mg Oral BID Marylyn Ishihara, Tyrone A, DO   0.4 mg at 07/21/21 0901    Medications Prior to Admission  Medication Sig Dispense Refill Last Dose   amoxicillin (AMOXIL) 500 MG capsule Take 1 capsule (500 mg total) by mouth every 12 (twelve) hours. 6 capsule 0 07/20/2021   brimonidine (ALPHAGAN) 0.2 % ophthalmic solution Place 1 drop into the left eye in the morning and at bedtime.   7 07/19/2021   budesonide (ENTOCORT EC) 3 MG 24 hr capsule Take 2 capsules (6 mg total) by mouth daily for 42 days, THEN 1 capsule (3 mg total) daily. (Patient taking differently: Take 2 capsules (6 mg total) by mouth daily for 42 days, THEN 1 capsule (3 mg total) daily. 07-15-2021 Per pt currently take 2 tabs in am) 160 capsule 0 07/19/2021   furosemide (LASIX) 20 MG tablet Take 1 tablet (20 mg total) by mouth daily as needed for edema (weight  gain>3#/24 hours). (Patient taking differently: Take 20 mg by mouth daily as needed for edema (weight gain>3#/24 hours).) 90 tablet 1 Past Week   latanoprost (XALATAN) 0.005 % ophthalmic solution Place 1 drop into the left eye at bedtime.    07/19/2021   levalbuterol (XOPENEX HFA) 45 MCG/ACT inhaler Inhale 2 puffs into the lungs every 4 (four) hours as needed for wheezing. 45 g  2 07/17/2021   MAGnesium-Oxide 400 (240 Mg) MG tablet TAKE 1 TABLET(400 MG) BY MOUTH THREE TIMES DAILY AS NEEDED (Patient taking differently: Take 400 mg by mouth 2 (two) times daily.) 270 tablet 1 07/19/2021   metoprolol tartrate (LOPRESSOR) 25 MG tablet TAKE 1 TABLET BY MOUTH  TWICE DAILY (Patient taking differently: Take 25 mg by mouth 2 (two) times daily.) 180 tablet 3 07/19/2021 at 0900   nitroGLYCERIN (NITROSTAT) 0.4 MG SL tablet Place 1 tablet (0.4 mg total) under the tongue every 5 (five) minutes as needed for chest pain. 30 tablet 0 unk   omeprazole (PRILOSEC OTC) 20 MG tablet Take 20 mg by mouth daily.   07/19/2021   potassium chloride (KLOR-CON) 10 MEQ tablet TAKE 1 TABLET BY MOUTH  DAILY (Patient taking differently: Take 10 mEq by mouth daily.) 90 tablet 3 07/19/2021   propafenone (RYTHMOL) 225 MG tablet TAKE 1 TABLET BY MOUTH  TWICE DAILY (Patient taking differently: Take 225 mg by mouth 2 (two) times daily.) 180 tablet 2 07/19/2021   Ranibizumab (LUCENTIS IO) Inject 1 Dose into the eye once as needed (Macular degeneration).   Past Month   tamsulosin (FLOMAX) 0.4 MG CAPS capsule Take 0.4 mg by mouth 2 (two) times daily.   07/19/2021   clobetasol (TEMOVATE) 0.05 % external solution APPLY 1 APPLICATION TOPICALLY TWICE DAILY (Patient not taking: No sig reported) 100 mL 3    dabigatran (PRADAXA) 150 MG CAPS capsule Take 1 capsule (150 mg total) by mouth 2 (two) times daily. (Patient not taking: No sig reported) 180 capsule 3 Not Taking   oxybutynin (DITROPAN) 5 MG tablet Take 1 tablet (5 mg total) by mouth 3 (three)  times daily as needed for up to 7 days for bladder spasms. 21 tablet 0     Family History  Problem Relation Age of Onset   Heart disease Mother    Diabetes Mother    Cirrhosis Mother    Emphysema Mother        never smoked but 2nd hand through her spouse   Hypertension Mother    Macular degeneration Mother    Heart disease Father    Cancer Father        prostate   Hyperlipidemia Father    Hypertension Father    Varicose Veins Father    Heart attack Father    Peripheral vascular disease Father    Heart disease Sister    Arthritis Sister    Hyperlipidemia Sister    Obesity Sister    Macular degeneration Sister    Heart disease Brother        5 stents   Hyperlipidemia Brother    Macular degeneration Maternal Grandfather    Cirrhosis Sister    Obesity Sister    Arthritis Sister    Heart disease Sister    Obesity Sister    Liver disease Other    Prostate cancer Other    Coronary artery disease Other    Colon cancer Neg Hx    Esophageal cancer Neg Hx    Rectal cancer Neg Hx    Stomach cancer Neg Hx        Physical Exam: BP (!) 161/56   Pulse (!) 50   Temp 98.3 F (36.8 C) (Oral)   Resp 13   Ht 5\' 4"  (1.626 m)   Wt 79.6 kg   SpO2 92%   BMI 30.12 kg/m    General appearance: alert, cooperative, and no distress Head: Normocephalic, without obvious abnormality  Neck: left carotid bruit Resp: clear to auscultation bilaterally Cardio: regular rate and rhythm Genitalia: Foley in place Extremities: edema - Neurologic: Alert and oriented X 3, normal strength and tone. Normal symmetric reflexes. Normal coordination and gait  Diagnostic Studies & Laboratory data:     Recent Radiology Findings:   CARDIAC CATHETERIZATION  Result Date: 07/21/2021 Images from the original result were not included.   Ost LM lesion is 90% stenosed.   Ost Cx to Prox Cx lesion is 60% stenosed.   Prox RCA lesion is 95% stenosed.   Dist RCA lesion is 70% stenosed.   RPDA lesion is 70%  stenosed. Ryan [name] is a 75 y.o. male  [id number] LOCATION:  FACILITY: [facility] PHYSICIAN: Quay Burow, M.D. 06-25-1946 DATE OF PROCEDURE:  07/21/2021 DATE OF DISCHARGE: CARDIAC CATHETERIZATION History obtained from chart review.  75 year old married Caucasian male with prior history of nonobstructive CAD.  He had a TURP last week and has had some hematuria.  He developed chest pain was brought to the emergency room where he had dynamic ST segment depression.  He was seen by Dr. Acie Fredrickson who felt he needed urgent cardiac cath.   Mr. Finigan has left main/three-vessel disease.  His LVEDP is 18.  His systolic blood pressures 149.  He is currently pain-free.  He will need CABG for complete revascularization.  The sheath was removed and a TR band was placed on the right wrist to achieve patent hemostasis.  The patient left lab in stable condition.  I will reheparinize him 4 hours after sheath removal.  T CTS has been consulted. Quay Burow. MD, [facility] 07/21/2021 3:12 PM    DG CHEST PORT 1 VIEW  Result Date: 07/21/2021 CLINICAL DATA:  Mid anterior chest pain EXAM: PORTABLE CHEST 1 VIEW COMPARISON:  Chest radiograph 07/20/2021 FINDINGS: The cardiomediastinal silhouette is stable. The central pulmonary vasculature is prominent, unchanged. Patchy opacities in the right base are increased in conspicuity. Otherwise, there is no focal consolidation or pulmonary edema. There is no pleural effusion or pneumothorax. The bones are stable. IMPRESSION: Slightly increased patchy opacities in the right base may reflect atelectasis or infection. Electronically Signed   By: Valetta Mole M.D.   On: 07/21/2021 11:25   DG Chest Port 1 View  Result Date: 07/20/2021 CLINICAL DATA:  Questionable sepsis, weakness EXAM: PORTABLE CHEST 1 VIEW COMPARISON:  03/27/2021 FINDINGS: Cardiomegaly. Both lungs are clear. The visualized skeletal structures are unremarkable. IMPRESSION: Cardiomegaly without acute abnormality of the lungs in AP  portable projection. Electronically Signed   By: Delanna Ahmadi M.D.   On: 07/20/2021 11:43     I have independently reviewed the above radiologic studies and discussed with the patient   Recent Lab Findings: Lab Results  Component Value Date   WBC 8.7 07/21/2021   HGB 9.9 (L) 07/21/2021   HCT 29.8 (L) 07/21/2021   PLT 160 07/21/2021   GLUCOSE 92 07/21/2021   CHOL 289 (H) 03/24/2021   TRIG 80 03/24/2021   HDL 51 03/24/2021   LDLDIRECT 134.2 12/01/2013   LDLCALC 222 (H) 03/24/2021   ALT 12 07/21/2021   AST 15 07/21/2021   NA 137 07/21/2021   K 4.2 07/21/2021   CL 106 07/21/2021   CREATININE 1.12 07/21/2021   BUN 15 07/21/2021   CO2 28 07/21/2021   TSH 2.38 03/04/2021   INR 1.1 07/20/2021   HGBA1C 5.5 03/24/2021   Assessment / Plan:   Coronary artery disease-significant left main disease included 2. History of CKD (  stage IIIA according to latest labs) Chronic Kidney Disease   Stage I     GFR >90  Stage II    GFR 60-89  Stage IIIA GFR 45-59  Stage IIIB GFR 30-44  Stage IV   GFR 15-29  Stage V    GFR  <15  Lab Results  Component Value Date   CREATININE 1.12 07/21/2021   Estimated Creatinine Clearance: 55.2 mL/min (by C-G formula based on SCr of 1.12 mg/dL).  3. History of TIA-on Dabigatran prior to admission. He last took 4. History of hypertension-on Lopressor 25 mg bid 5. History of PAF (paroxysmal atrial fibrillation) (HCC)-on Propafenone prior to admission 6. History of BPH with obstruction-on Flomax, had cystoscopy/TURP at [facility] on 07/17/2021.    Lars Pinks PA-C 07/21/2021 3:50 PM  Patient seen and examined, agree with above He is a 75 yo man with multiple medical issues including paroxysmal atrial fib. Also a history of rheumatic fever, but no significant valve pathology by echo in June. Recent TURP. Yesterday new onset unstable Cp then another episode today. Cath shows left main and 3 vessel CAD. Brief episode of pain again postcath, currently  pain free.  CABG indicated for survival benefit and relief of symptoms.  I discussed the general nature of the procedure, including the need for general anesthesia, the incisions to be used, the use of cardiopulmonary bypass and drainage tubes and temporary pacing wires with Mr Frick.  We discussed the expected hospital stay, overall recovery and short and long term outcomes.  I informed him of the indications, risks, benefits and alternatives.  He understand the risks include, but are not limited to death, stroke, MI, DVT/PE, bleeding, possible need for transfusion, infections, cardiac arrhythmias, as well as other organ system dysfunction including respiratory, renal, or GI complications.   He accepts the risks and agrees to proceed.  For OR first case tomorrow AM.   Revonda Standard. Roxan Hockey, MD [facility] [phone]

## 2021-07-21 NOTE — Progress Notes (Addendum)
   07/21/21 1032  Pain Assessment  Pain Scale 0-10  Pain Score 10  Pain Type Acute pain  Pain Location Chest  Pain Orientation Mid;Anterior  Provider Notification  Provider Name/Title Maryland Pink  Date Provider Notified 07/21/21  Time Provider Notified 1035  Notification Type Page  Notification Reason Change in status (sudden 10/10 chest pain, nausea)  Provider response Evaluate remotely  Date of Provider Response 07/21/21  Time of Provider Response 1040   1047-Patient is tearful and clutching his chest. Dr Maryland Pink notified and ordered morphine 2 mg IV. EKG completed for physician review. Vital signs stable. Will continue to monitor.   1110- pain has started to subside. Now a 4/10. Vital signs remain stable. Chest xray completed and labs pending.

## 2021-07-21 NOTE — Progress Notes (Signed)
Pre cabg has been completed.   Preliminary results in CV Proc.   Jinny Blossom Shaunn Tackitt 07/21/2021 5:01 PM

## 2021-07-21 NOTE — Progress Notes (Signed)
ANTICOAGULATION CONSULT NOTE   Pharmacy Consult for Heparin Indication: chest pain/ACS  Allergies  Allergen Reactions   Albumin (Human) Anaphylaxis   Polymyxin B-Trimethoprim Swelling    Eye drops made eyes swell   Pseudoephedrine Other (See Comments)    Stomach cramps   Codeine Hives, Itching and Rash   Guaiacol Other (See Comments)    Hallucinations   Statins Other (See Comments)    Muscle cramps    Gabapentin Other (See Comments)    Pt unsure of sensitivity   Meloxicam Other (See Comments)    Pt unsure of sensitivity   Pseudoephedrine-Guaifenesin Nausea And Vomiting    Stomach cramps   Rosuvastatin Calcium Other (See Comments)    Muscle aches   Tapentadol Other (See Comments)    Pt unsure of sensitivity   Ciprofloxacin Hives, Itching, Nausea Only and Rash   Moxifloxacin Nausea Only and Other (See Comments)    Headaches, stomach cramps    Rofecoxib Other (See Comments)    Stomach cramping    Patient Measurements: Height: 5\' 4"  (162.6 cm) Weight: 79.7 kg (175 lb 11.3 oz) IBW/kg (Calculated) : 59.2 Heparin Dosing Weight: 75.7 kg  Vital Signs: Temp: 98.3 F (36.8 C) (10/17 0623) Temp Source: Oral (10/17 0623) BP: 127/62 (10/17 1104) Pulse Rate: 53 (10/17 1104)  Labs: Recent Labs    07/20/21 1057 07/20/21 1103 07/20/21 1502 07/20/21 1530 07/20/21 1836 07/20/21 2031 07/21/21 0020 07/21/21 0615  HGB 11.0*  --  10.3*  --  10.3*  --  9.4* 9.9*  HCT 33.7*  --  30.4*  --  31.2*  --  27.6* 29.8*  PLT 197  --  169  --   --   --   --  160  APTT  --  28  --   --   --   --   --   --   LABPROT  --  14.0  --   --   --   --   --   --   INR  --  1.1  --   --   --   --   --   --   CREATININE 1.20  --   --  1.17  --   --   --  1.12  TROPONINIHS  --   --   --   --  27* 19*  --   --     Estimated Creatinine Clearance: 55.2 mL/min (by C-G formula based on SCr of 1.12 mg/dL).   Medical History: Past Medical History:  Diagnosis Date   Anemia    Anticoagulant  long-term use    changed from eliquis to praxada 06/ 2022--- mananged by cardiology   BPH with urinary obstruction    CAD (coronary artery disease)    cardiologist--- dr g. taylor--- cath 04-22-2011  moderate LM stenosis, borderline sig pRCA, sig stenosis mid to distal LAD ;  aggressive medical therapy   Chronic diastolic CHF (congestive heart failure) ([place]) 03/2018   followed by cardiology   CKD (chronic kidney disease), stage II    COPD (chronic obstructive pulmonary disease) ([place])    followed by pcp   DOE (dyspnea on exertion)    per pt when walk >100yds,  per pt rides outside bike 4-8 miles daily without sob,  household chores and yard work without sob   Eczema    GERD (gastroesophageal reflux disease)    Glaucoma, left eye    Heart murmur    Hemorrhoids  History of adenomatous polyp of colon    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 Bryan Lemma--- dx by biopsy 01-28-2021   Macular degeneration of both eyes    Osteoporosis 05/31/2016   PAF (paroxysmal atrial fibrillation) ([place]) 03/2012   cardiologist-- dr g. taylor;  first dx 06/ 2013 AFlutter w/ RVR   Urethral lesion    Vitamin D deficiency     Medications:  Scheduled:   brimonidine  1 drop Left Eye Q12H   budesonide  6 mg Oral Daily   Chlorhexidine Gluconate Cloth  6 each Topical Daily   heparin  4,000 Units Intravenous NOW   latanoprost  1  drop Left Eye QHS   metoprolol tartrate  25 mg Oral BID   morphine       propafenone  225 mg Oral BID   tamsulosin  0.4 mg Oral BID   Infusions:   ceFEPime (MAXIPIME) IV 2 g (07/21/21 0904)   heparin      Assessment: 10/13 TURP for BPH > home 10/14  to ED 10/15 with retention, clots > foley placed > home, return 10/16 Hx Afib on Pradaxa 150mg  bid - was held for TURP, verified no Pradaxa after return home, UOP no bleed at start of Heparin for ACS, pt/wife aware of risk/benefit  To [facility] for Cath  Goal of Therapy:  Heparin level 0.3-0.7 units/ml Monitor platelets by anticoagulation protocol: Yes   Plan:  Heparin bolus 4000 units, infusion at 1000 units/hr Check 6 hr Heparin level if continued post-cath Daily CBC  Nyoka Cowden, Rona Ravens PharmD 07/21/2021,11:55 AM

## 2021-07-21 NOTE — Progress Notes (Signed)
Patient ID: Ryan [name], male   DOB: 29-Nov-1945, 75 y.o.   MRN: [id number]    Subjective: Pt feeling improved.  No specific complaints this morning.  Objective: Vital signs in last 24 hours: Temp:  [97.7 F (36.5 C)-98.7 F (37.1 C)] 98.3 F (36.8 C) (10/17 0623) Pulse Rate:  [39-70] 47 (10/17 0623) Resp:  [11-30] 20 (10/17 0623) BP: (134-180)/(50-100) 142/68 (10/17 0623) SpO2:  [91 %-99 %] 94 % (10/17 0623) Weight:  [78.6 kg-79.7 kg] 79.7 kg (10/16 1758)  Intake/Output from previous day: 10/16 0701 - 10/17 0700 In: 5649.6 [P.O.:240; I.V.:2709.6; IV Piggyback:2700] Out: 1350 [Urine:1350] Intake/Output this shift: No intake/output data recorded.  Physical Exam:  General: Alert and oriented GU: Urine almost clear now  Lab Results: Recent Labs    07/20/21 1836 07/21/21 0020 07/21/21 0615  HGB 10.3* 9.4* 9.9*  HCT 31.2* 27.6* 29.8*   BMET Recent Labs    07/20/21 1530 07/21/21 0615  NA 137 137  K 4.0 4.2  CL 106 106  CO2 25 28  GLUCOSE 95 92  BUN 18 15  CREATININE 1.17 1.12  CALCIUM 8.7* 8.8*     Studies/Results: Urine and blood cultures pending  Assessment/Plan: 1) Febrile UTI s/p TURP: Continue indwelling catheter for now considering recent fever.  He should continue empiric antibiotics pending cultures results.  He looks clinically improved this morning.  Will leave decision as to whether to discharge him with empiric oral antibiotics or to continue IV antibiotics in the hospital until cultures result up to primary service.  Will likely delay another voiding trial until he is near the end of his current course of antibiotics and will arrange with Dr. Diona [name] as an outpatient.   LOS: 1 day   Ryan [name] 07/21/2021, 7:18 AM

## 2021-07-21 NOTE — Progress Notes (Addendum)
[facility] PROGRESS NOTE   DIOGENES WHIRLEY AUQ:[id number] DOB: 1945-10-26 DOA: 07/20/2021  PCP: [name], MD  Brief History/Interval Summary: 75 y.o. male with medical history significant of a fib on pradaxa, CAD, BPH s/p TURP w/ chronic foley.  Presented with pain in the lower abdomen especially with urinating.  Noted blood in the urine.  Also felt feverish.  Concern was for urinary tract infection.  Due to recent TURP procedure patient was hospitalized.  Urology was consulted.     Reason for Visit: Urinary tract infection.  Hematuria  Consultants: Urology  Procedures: Foley catheter placement    Subjective/Interval History: Patient mentions that he is feeling better.  His wife is at the bedside.  Patient mentions that his urine is not as bloody as it was.  Denies any abdominal pain nausea or vomiting.  Called by nursing staff around 10:40 AM that patient was complaining of retrosternal chest pain 10 out of 10 in intensity.  He was noted to be diaphoretic.  Vital signs were noted to be stable.  EKG is being obtained.  Chest x-ray has been ordered.  He will be given morphine.  Will also repeat troponins.    Assessment/Plan:  Urinary tract infection, acute with hematuria Recent TURP procedure by urology.  Foley catheter was placed recently.  Patient started empirically on antibiotics including cefepime.   Wait on urine cultures.  WBC was noted to be normal.  He is noted to be afebrile.  Chest pain Patient mention chest discomfort when he was hospitalized.  Troponin levels were insignificant.  EKG showed subtle changes in the lateral leads but noted to be similar to before.  It was felt that his symptoms were anxiety induced.  Appears to be having similar symptoms today.  Will repeat EKG chest x-ray troponin levels and D-dimer.  Apparently has a history of moderate coronary artery disease which is managed medically.  Also has history of atrial fibrillation as discussed  below.  Looks like his last cardiac catheterization was in 2012 he was found to have moderate left main and RCA stenosis but not thought to be significant by FFR and IVUS. Followed by Dr. Crissie Sickles.  Noted to be on beta-blocker.  Has been on anticoagulation previously but held due to hematuria.  Should be okay okay to give him aspirin for his acute symptoms.  ADDENDUM EKG with ST depression and T wave inversion in the lateral leads.  This appears to be much more pronounced and significant compared to previous EKG from yesterday.  Patient seen at bedside.  Currently pain is 4 out of 10 in intensity located in the central part of his chest.  No new murmurs appreciated on examination.  Vital signs are stable.  Appears that patient is experiencing acute coronary syndrome.  Follow-up on troponin levels.  We will give him aspirin, oxygen. NTG. Discussed with cardiology who will consult.  Will initiate heparin understanding that this may worsen his hematuria.  Discussed with patient and his wife and they understand.  Dr. Alinda Money with the urology was also notified of these new events.  Discussed with Dr. Acie Fredrickson at bedside.  He also suspect that patient is experiencing ACS.  Might need cardiac catheterization.  Other differential diagnoses include acute pulmonary embolism which seems to be somewhat less likely considering his acute EKG changes.  We will first see what cardiac catheterization reveals and then he may need further work-up if cath is inconclusive.  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  [facility] will [facility] back at [facility].  Recent TURP/bladder spasms/hematuria Urology is managing.  Foley catheter has been placed.  Hematuria appears to be subsiding.  Hemoglobin is low but stable.  Continue Flomax.  Atrial fibrillation, paroxysmal Pradaxa has been on hold since before his TURP.  Continues to be on hold due to  hematuria.  Currently in sinus rhythm.  Noted to be on propafenone.  Essential hypertension Monitor blood pressures closely.  History of stroke Statin.  History of glaucoma Continue eyedrop  Obesity Estimated body mass index is 30.16 kg/m as calculated from the following:   Height as of this encounter: 5\' 4"  (1.626 m).   Weight as of this encounter: 79.7 kg.   DVT Prophylaxis: SCDs for now due to hematuria Code Status: Full code Family Communication: Discussed with patient and his wife Disposition Plan: Hopefully return home when improved  Status is: Inpatient  Remains inpatient appropriate because: Chest pain, hematuria, UTI      Medications: Scheduled:  brimonidine  1 drop Left Eye Q12H   budesonide  6 mg Oral Daily   Chlorhexidine Gluconate Cloth  6 each Topical Daily   latanoprost  1 drop Left Eye QHS   metoprolol tartrate  25 mg Oral BID   propafenone  225 mg Oral BID   tamsulosin  0.4 mg Oral BID   Continuous:  ceFEPime (MAXIPIME) IV 2 g (07/21/21 0904)   TGG:YIRSWNIOEVOJJ **OR** acetaminophen, HYDROcodone-acetaminophen, levalbuterol, morphine injection, ondansetron **OR** ondansetron (ZOFRAN) IV, oxybutynin  Antibiotics: Anti-infectives (From admission, onward)    Start     Dose/Rate Route Frequency Ordered Stop   07/20/21 2200  ceFEPIme (MAXIPIME) 2 g in sodium chloride 0.9 % 100 mL IVPB        2 g 200 mL/hr over 30 Minutes Intravenous Every 12 hours 07/20/21 1839     07/20/21 1115  ceFEPIme (MAXIPIME) 2 g in sodium chloride 0.9 % 100 mL IVPB        2 g 200 mL/hr over 30 Minutes Intravenous  Once 07/20/21 1111 07/20/21 1203       Objective:  Vital Signs  Vitals:   07/20/21 1758 07/20/21 2200 07/21/21 0202 07/21/21 0623  BP: (!) 172/58 (!) 150/51 (!) 144/50 (!) 142/68  Pulse: (!) 54 (!) 52 (!) 57 (!) 47  Resp: 18  18 20   Temp: 98.4 F (36.9 C) 98.2 F (36.8 C) 98.7 F (37.1 C) 98.3 F (36.8 C)  TempSrc: Oral Oral Oral Oral  SpO2: 98%  99% 96% 94%  Weight: 79.7 kg     Height: 5\' 4"  (1.626 m)       Intake/Output Summary (Last 24 hours) at 07/21/2021 1034 Last data filed at 07/21/2021 0900 Gross per 24 hour  Intake 5889.63 ml  Output 1350 ml  Net 4539.63 ml   Filed Weights   07/20/21 1024 07/20/21 1758  Weight: 78.6 kg 79.7 kg    General appearance: Awake alert.  In no distress Resp: Clear to auscultation bilaterally.  Normal effort Cardio: S1-S2 is normal regular.  No S3-S4.  No rubs murmurs or bruit GI: Abdomen is soft.  Nontender nondistended.  Bowel sounds are present normal.  No masses organomegaly Extremities: No edema.  Full range of motion of lower extremities. Neurologic: Alert and oriented x3.  No focal neurological deficits.    Lab Results:  Data Reviewed: I have personally reviewed following labs and imaging studies  CBC:  Recent Labs  Lab 07/19/21 1556 07/19/21 1835 07/20/21 1057 07/20/21 1502 07/20/21 1836 07/21/21 0020 07/21/21 0615  WBC 12.4*  --  9.3 8.5  --   --  8.7  NEUTROABS 8.7*  --  6.5 5.1  --   --   --   HGB 10.9*   < > 11.0* 10.3* 10.3* 9.4* 9.9*  HCT 32.6*   < > 33.7* 30.4* 31.2* 27.6* 29.8*  MCV 93.9  --  93.9 93.8  --   --  95.2  PLT 180  --  197 169  --   --  160   < > = values in this interval not displayed.    Basic Metabolic Panel: Recent Labs  Lab 07/17/21 0628 07/19/21 1556 07/20/21 1057 07/20/21 1530 07/21/21 0615  NA 139 133* 135 137 137  K 4.5 4.5 4.0 4.0 4.2  CL 104 103 104 106 106  CO2  --  21* 23 25 28   GLUCOSE 93 90 115* 95 92  BUN 22 27* 22 18 15   CREATININE 1.30* 1.42* 1.20 1.17 1.12  CALCIUM  --  8.8* 9.1 8.7* 8.8*  PHOS  --   --   --  3.0  --     GFR: Estimated Creatinine Clearance: 55.2 mL/min (by C-G formula based on SCr of 1.12 mg/dL).  Liver Function Tests: Recent Labs  Lab 07/20/21 1530 07/21/21 0615  AST  --  15  ALT  --  12  ALKPHOS  --  48  BILITOT  --  0.8  PROT  --  5.7*  ALBUMIN 3.1* 3.1*     Coagulation  Profile: Recent Labs  Lab 07/20/21 1103  INR 1.1     BNP (last 3 results) Recent Labs    09/09/20 1400  PROBNP 209.0*     Recent Results (from the past 240 hour(s))  SARS Coronavirus 2 (TAT 6-24 hrs)     Status: None   Collection Time: 07/14/21 12:00 AM  Result Value Ref Range Status   SARS Coronavirus 2 RESULT: NEGATIVE  Final    Comment: RESULT: NEGATIVESARS-CoV-2 INTERPRETATION:A NEGATIVE  test result means that SARS-CoV-2 RNA was not present in the specimen above the limit of detection of this test. This does not preclude a possible SARS-CoV-2 infection and should not be used as the  sole basis for patient management decisions. Negative results must be combined with clinical observations, patient history, and epidemiological information. Optimum specimen types and timing for peak viral levels during infections caused by SARS-CoV-2  have not been determined. Collection of multiple specimens or types of specimens may be necessary to detect virus. Improper specimen collection and handling, sequence variability under primers/probes, or organism present below the limit of detection may  lead to false negative results. Positive and negative predictive values of testing are highly dependent on prevalence. False negative test results are more likely when prevalence of disease is high.The expected result is NEGATIVE.Fact S heet for  Healthcare Providers: LocalChronicle.no Sheet for Patients: SalonLookup.es Reference Range - Negative   Urine Culture     Status: None   Collection Time: 07/19/21  3:45 PM   Specimen: Urine, Catheterized  Result Value Ref Range Status   Specimen Description   Final    URINE, CATHETERIZED Performed at [facility], [place] [address], [place], [place] [address]    Special Requests   Final    NONE Performed at [facility], [place] [address], [place], [place]  [address]  Culture   Final    NO GROWTH Performed at [facility], [place] [address] [place], [place] [address]    Report Status 07/21/2021 FINAL  Final  Blood Culture (routine x 2)     Status: None (Preliminary result)   Collection Time: 07/20/21 10:30 AM   Specimen: BLOOD  Result Value Ref Range Status   Specimen Description   Final    BLOOD BLOOD RIGHT HAND Performed at [facility] [address], [place], [place] [address]    Special Requests   Final    BOTTLES DRAWN AEROBIC AND ANAEROBIC Blood Culture adequate volume Performed at [facility] [address], [place], [place] [address]    Culture   Final    NO GROWTH < 24 HOURS Performed at [facility] [address] [place], [place] [address]    Report Status PENDING  Incomplete  Blood Culture (routine x 2)     Status: None (Preliminary result)   Collection Time: 07/20/21 10:38 AM   Specimen: BLOOD  Result Value Ref Range Status   Specimen Description   Final    BLOOD LEFT ANTECUBITAL Performed at [facility] [address], [place], [place] [address]    Special Requests   Final    BOTTLES DRAWN AEROBIC AND ANAEROBIC Blood Culture results may not be optimal due to an excessive volume of blood received in culture bottles Performed at [facility] [address], [place], [place] [address]    Culture   Final    NO GROWTH < 24 HOURS Performed at [facility] [address], [place], [place] [address]    Report Status PENDING  Incomplete  Resp Panel by RT-PCR (Flu A&B, Covid) Nasopharyngeal Swab     Status: None   Collection Time: 07/20/21 11:03 AM   Specimen: Nasopharyngeal Swab; Nasopharyngeal(NP) swabs in vial transport medium  Result Value Ref Range Status   SARS Coronavirus 2 by RT PCR NEGATIVE NEGATIVE Final    Comment: (NOTE) SARS-CoV-2 target nucleic acids are NOT DETECTED.  The SARS-CoV-2 RNA is generally  detectable in upper respiratory specimens during the acute phase of infection. The lowest concentration of SARS-CoV-2 viral copies this assay can detect is 138 copies/mL. A negative result does not preclude SARS-Cov-2 infection and should not be used as the sole basis for treatment or other patient management decisions. A negative result may occur with  improper specimen collection/handling, submission of specimen other than nasopharyngeal swab, presence of viral mutation(s) within the areas targeted by this assay, and inadequate number of viral copies(<138 copies/mL). A negative result must be combined with clinical observations, patient history, and epidemiological information. The expected result is Negative.  Fact Sheet for Patients:  EntrepreneurPulse.com.au  Fact Sheet for Healthcare Providers:  IncredibleEmployment.be  This test is no t yet approved or cleared by the [place] FDA and  has been authorized for detection and/or diagnosis of SARS-CoV-2 by FDA under an Emergency Use Authorization (EUA). This EUA will remain  in effect (meaning this test can be used) for the duration of the COVID-19 declaration under Section 564(b)(1) of the Act, 21 U.S.C.section 360bbb-3(b)(1), unless the authorization is terminated  or revoked sooner.       Influenza A by PCR NEGATIVE NEGATIVE Final   Influenza B by PCR NEGATIVE NEGATIVE Final    Comment: (NOTE) The Xpert Xpress SARS-CoV-2/FLU/RSV plus assay is intended as an aid in the diagnosis of  influenza from Nasopharyngeal swab specimens and should not be used as a sole basis for treatment. Nasal washings and aspirates are unacceptable for Xpert Xpress SARS-CoV-2/FLU/RSV testing.  Fact Sheet for Patients: EntrepreneurPulse.com.au  Fact Sheet for Healthcare Providers: IncredibleEmployment.be  This test is not yet approved or cleared by the [place] FDA  and has been authorized for detection and/or diagnosis of SARS-CoV-2 by FDA under an Emergency Use Authorization (EUA). This EUA will remain in effect (meaning this test can be used) for the duration of the COVID-19 declaration under Section 564(b)(1) of the Act, 21 U.S.C. section 360bbb-3(b)(1), unless the authorization is terminated or revoked.  Performed at [facility], [place] [address] [place], [place] [address]       Radiology Studies: [facility] Chest Port 1 View  Result Date: 07/20/2021 CLINICAL DATA:  Questionable sepsis, weakness EXAM: PORTABLE CHEST 1 VIEW COMPARISON:  03/27/2021 FINDINGS: Cardiomegaly. Both lungs are clear. The visualized skeletal structures are unremarkable. IMPRESSION: Cardiomegaly without acute abnormality of the lungs in AP portable projection. Electronically Signed   By: Delanna Ahmadi M.D.   On: 07/20/2021 11:43       LOS: 1 day   [facility] Pager on [email or web address]  07/21/2021, 10:34 AM

## 2021-07-21 NOTE — Plan of Care (Signed)
  Problem: Education: Goal: Knowledge of General Education information will improve Description: Including pain rating scale, medication(s)/side effects and non-pharmacologic comfort measures Outcome: Progressing   Problem: Clinical Measurements: Goal: Diagnostic test results will improve Outcome: Progressing   Problem: Activity: Goal: Risk for activity intolerance will decrease Outcome: Progressing   

## 2021-07-21 NOTE — H&P (View-Only) (Signed)
[address]       [place] [address]             [place] Record #[id number] Date of Birth: [birth year]  Referring: Dr. Gwenlyn Found, MD Primary Care: [name], MD Primary Cardiologist:Gregg Lovena Le, MD  Chief Complaint:    Chief Complaint  Patient presents with   Fever   Fatigue  Reason for consultation:Coronary artery disease (significant LM disease included)  History of Present Illness:     This is a 75 year old male with a past medical history of CAD (known moderate left main disease since 2012, borderline significant pRCA, and significant mid to distal LAD stenosis), hypertension, CKD, COPD (chronic obstructive pulmonary disease) (HCC), chronic diastolic CHF, TIA , PAF (paroxysmal atrial fibrillation, on Dabigatran), lumbar radiculopathy (left), and BPH/obstruction who on 07/17/2021 underwent a cystoscopy/TURP by Dr. Diona Fanti. He was discharged on 10/14 but returned to the [facility] ED on 07/19/2021 with complaints of urinary retention and hematuria. He had a foley placed and was discharged and instructed to follow up with urologist for voiding trial the following week. However, he did not feel well (weak and had complaints of a fever, bladder spasms and further hematuria) so he returned to [facility] ED on 07/20/2021. He was placed on Cefepime. Urology was consulted. Per note, foley was draining well without significant hematuria and his H/H was stable from prior visits. UA showed bacteria but no nitrites. Oxybutynin was recommended for bladder spasms. Patient then developed chest pain (and later diaphoresis) early on 07/21/2021. Cardiology was consulted. This was felt to be acute coronary syndrome.  Apparently, EKG showed acute worsening of T wave inversion and ST segment depression in the anterior and septal leads. He received aspirin, Nitroglycerin, and he was placed on Heparin drip. Patient was transferred to [facility] for  further evaluation.  Cardiac catheterization showed ostial left main disease 90% stenosed, ostial to proximal Circumflex with a 60% stenosis, proximal RCA with a 70% stenosis, and RPDA with a 70% stenosis. Dr. Roxan Hockey has been consulted regarding consideration for coronary artery disease.  On further questioning has been having shortness of breath and chest tightness when riding his bike for several months.  Current Activity/ Functional Status: Patient is independent with mobility/ambulation, transfers, ADL's, IADL's.   Zubrod Score: At the time of surgery this patient's most appropriate activity status/level should be described as: []     0    Normal activity, no symptoms [x]     1    Restricted in physical strenuous activity but ambulatory, able to do out light work []     2    Ambulatory and capable of self care, unable to do work activities, up and about                 more than 50%  Of the time                            []     3    Only limited self care, in bed greater than 50% of waking hours []     4    Completely disabled, no self care, confined to bed or chair []     5    Moribund  Past Medical History:  Diagnosis Date   Anemia    Anticoagulant long-term use    changed from eliquis  to praxada 06/ 2022--- mananged by cardiology   BPH with urinary obstruction    CAD (coronary artery disease)    cardiologist--- dr g. taylor--- cath 04-22-2011  moderate LM stenosis, borderline sig pRCA, sig stenosis mid to distal LAD ;  aggressive medical therapy   Chronic diastolic CHF (congestive heart failure) ([place]) 03/2018   followed by cardiology   CKD (chronic kidney disease), stage II    COPD (chronic obstructive pulmonary disease) ([place])    followed by pcp   DOE (dyspnea on exertion)    per pt when walk >100yds,  per pt rides outside bike 4-8 miles daily without sob,  household chores and yard work without sob   Eczema    GERD (gastroesophageal reflux disease)    Glaucoma, left eye     Heart murmur    Hemorrhoids    History of adenomatous polyp of colon    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 Bryan Lemma--- dx by biopsy 01-28-2021   Macular degeneration of both eyes    Osteoporosis 05/31/2016   PAF (paroxysmal atrial fibrillation) ([place]) 03/2012   cardiologist-- dr g. taylor;  first dx 06/ 2013 AFlutter w/ RVR   Urethral lesion    Vitamin D deficiency     Past Surgical History:  Procedure Laterality Date   ANTERIOR CERVICAL DECOMP/DISCECTOMY FUSION  06/08/2002   @[facility] ;  C5--C7   [place]  04/22/2011   moderate left main and RCA stenosis not significant by FFR and IVUS on medical therapy   CARDIOVERSION N/A 04/25/2018   Procedure: CARDIOVERSION;  Surgeon: Skeet Latch, MD;  Location: [facility];  Service: Cardiovascular;  Laterality: N/A;   CATARACT EXTRACTION W/ INTRAOCULAR LENS IMPLANT Bilateral 2021   COLONOSCOPY WITH ESOPHAGOGASTRODUODENOSCOPY (EGD)  01/28/2021   by [email or web address]   [place] LITHOTRIPSY     x2  1990s   FOOT SURGERY Right 1970   calcification removed from top of foot   KNEE ARTHROSCOPY Right 1991   LAPAROSCOPIC CHOLECYSTECTOMY  2000   TOTAL KNEE ARTHROPLASTY Right 08/14/2009   @[facility]    TRANSURETHRAL RESECTION OF PROSTATE N/A 07/17/2021   Procedure: TRANSURETHRAL RESECTION OF THE PROSTATE (TURP);  Surgeon: [name], MD;  Location: [facility];  Service: Urology;  Laterality: N/A;  1 HR    Social History   Tobacco Use  Smoking Status Former   Packs/day: 0.50   Years: 50.00   Pack years: 25.00   Types: Cigarettes   Quit date: 05/01/2009   Years since quitting: 12.2  Smokeless Tobacco Former   Types: Chew   Quit date: 1995    Social History   Substance and Sexual Activity  Alcohol Use Yes   Alcohol/week: 2.0 standard drinks   Types:  2 Cans of beer per week   Comment: 1 beer daily     Allergies  Allergen Reactions   Albumin (Human) Anaphylaxis   Polymyxin B-Trimethoprim Swelling    Eye drops made eyes swell   Pseudoephedrine Other (See Comments)    Stomach cramps   Codeine Hives, Itching and Rash   Guaiacol Other (See Comments)    Hallucinations   Statins Other (See Comments)    Muscle cramps    Gabapentin Other (See Comments)    Pt unsure of sensitivity   Meloxicam Other (See Comments)    Pt unsure of sensitivity   Pseudoephedrine-Guaifenesin Nausea And Vomiting    Stomach cramps   Rosuvastatin Calcium Other (See Comments)    Muscle aches   Tapentadol Other (See Comments)    Pt unsure of sensitivity   Ciprofloxacin Hives, Itching, Nausea Only and Rash   Moxifloxacin Nausea Only and Other (See Comments)    Headaches, stomach cramps    Rofecoxib Other (See Comments)    Stomach cramping    Current Facility-Administered Medications  Medication Dose Route Frequency Provider Last Rate Last Admin   0.9 %  sodium chloride infusion  250 mL Intravenous PRN Nahser, Wonda Cheng, MD       0.9 %  sodium chloride infusion   Intravenous Continuous Lorretta Harp, MD 75 mL/hr at 07/21/21 1528 Rate Change at 07/21/21 1528   0.9% sodium chloride infusion  1 mL/kg/hr Intravenous Continuous Sherren Mocha, MD 79.7 mL/hr at 07/21/21 1408 1 mL/kg/hr at 07/21/21 1408   [MAR Hold] acetaminophen (TYLENOL) tablet 650 mg  650 mg Oral Q6H PRN Marylyn Ishihara, Tyrone A, DO       Or    [MAR Hold] acetaminophen (TYLENOL) suppository 650 mg  650 mg Rectal Q6H PRN Marylyn Ishihara, Tyrone A, DO       [START ON 07/22/2021] aspirin chewable tablet 81 mg  81 mg Oral Pre-Cath Nahser, Wonda Cheng, MD       [MAR Hold] brimonidine (ALPHAGAN) 0.2 % ophthalmic solution 1 drop  1 drop Left Eye Q12H Kyle, Tyrone A, DO   1 drop at 07/21/21 0905   [MAR Hold] budesonide (ENTOCORT EC) 24 hr capsule 6 mg  6 mg Oral Daily Kyle, Tyrone A, DO   6 mg at 07/21/21 0901   [MAR Hold] ceFEPIme (MAXIPIME) 2 g in sodium chloride 0.9 % 100 mL IVPB  2 g Intravenous Q12H Kyle, Tyrone A, DO 200 mL/hr at 07/21/21 0904 2 g at 07/21/21 0904   [MAR Hold] Chlorhexidine Gluconate Cloth 2 % PADS 6 each  6 each Topical Daily Kyle, Tyrone A, DO   6 each at 07/21/21 0905   fentaNYL (SUBLIMAZE) injection    PRN Lorretta Harp, MD   25 mcg at 07/21/21 1436   Heparin (Porcine) in NaCl 1000-0.9 UT/500ML-% SOLN    PRN Lorretta Harp, MD   500 mL at 07/21/21 1436   heparin ADULT infusion 100 units/mL (25000 units/220mL)  1,000 Units/hr Intravenous Continuous Minda Ditto, RPH 10 mL/hr at 07/21/21 1202 1,000 Units/hr at 07/21/21 1202   heparin sodium (porcine) injection    PRN Lorretta Harp, MD   4,000 Units at 07/21/21 1448   [MAR Hold] HYDROcodone-acetaminophen (NORCO/VICODIN) 5-325 MG per tablet 1 tablet  1 tablet Oral Q6H PRN Bonnielee Haff, MD       iohexol (OMNIPAQUE) 350 MG/ML injection    PRN Lorretta Harp, MD   50 mL at 07/21/21 1458   [MAR Hold]  latanoprost (XALATAN) 0.005 % ophthalmic solution 1 drop  1 drop Left Eye QHS Kyle, Tyrone A, DO   1 drop at 07/20/21 2259   [MAR Hold] levalbuterol (XOPENEX) nebulizer solution 2.5 mg  2.5 mg Inhalation Q4H PRN Marylyn Ishihara, Tyrone A, DO       lidocaine (PF) (XYLOCAINE) 1 % injection    PRN Lorretta Harp, MD   2 mL at 07/21/21 1443   [MAR Hold] metoprolol tartrate (LOPRESSOR) tablet 25 mg  25 mg Oral BID Marylyn Ishihara, Tyrone A, DO       midazolam (VERSED) injection    PRN Lorretta Harp, MD   1 mg at 07/21/21 1436   [MAR Hold] morphine 2 MG/ML injection 2 mg  2 mg Intravenous Q2H PRN Bonnielee Haff, MD   2 mg at 07/21/21 1223   morphine 2 MG/ML injection            [MAR Hold] nitroGLYCERIN (NITROSTAT) SL tablet 0.4 mg  0.4 mg Sublingual Q5 min PRN Bonnielee Haff, MD   0.4 mg at 07/21/21 1221   [MAR Hold] ondansetron (ZOFRAN) tablet 4 mg  4 mg Oral Q6H PRN Marylyn Ishihara, Tyrone A, DO       Or   [MAR Hold] ondansetron (ZOFRAN) injection 4 mg  4 mg Intravenous Q6H PRN Marylyn Ishihara, Tyrone A, DO       [MAR Hold] oxybutynin (DITROPAN) tablet 5 mg  5 mg Oral TID PRN Marylyn Ishihara, Tyrone A, DO   5 mg at 07/20/21 1836   [MAR Hold] propafenone (RYTHMOL) tablet 225 mg  225 mg Oral BID Marylyn Ishihara, Tyrone A, DO   225 mg at 07/21/21 0901   Radial Cocktail (Verapamil 5 mg, NTG, Lidocaine)    PRN Lorretta Harp, MD   5 mL at 07/21/21 1447   sodium chloride flush (NS) 0.9 % injection 3 mL  3 mL Intravenous Q12H Nahser, Wonda Cheng, MD   3 mL at 07/21/21 1230   sodium chloride flush (NS) 0.9 % injection 3 mL  3 mL Intravenous PRN Nahser, Wonda Cheng, MD       [MAR Hold] tamsulosin [facility]) capsule 0.4 mg  0.4 mg Oral BID Marylyn Ishihara, Tyrone A, DO   0.4 mg at 07/21/21 0901    Medications Prior to Admission  Medication Sig Dispense Refill Last Dose   amoxicillin (AMOXIL) 500 MG capsule Take 1 capsule (500 mg total) by mouth every 12 (twelve) hours. 6 capsule 0 07/20/2021   brimonidine (ALPHAGAN) 0.2 % ophthalmic solution Place 1 drop into the left eye in the morning and at bedtime.   7 07/19/2021   budesonide (ENTOCORT EC) 3 MG 24 hr capsule Take 2 capsules (6 mg total) by mouth daily for 42 days, THEN 1 capsule (3 mg total) daily. (Patient taking differently: Take 2 capsules (6 mg total) by mouth daily for 42 days, THEN 1 capsule (3 mg total) daily. 07-15-2021 Per pt currently take 2 tabs in am) 160 capsule 0 07/19/2021   furosemide (LASIX) 20 MG tablet Take 1 tablet (20 mg total) by mouth daily as needed for edema (weight  gain>3#/24 hours). (Patient taking differently: Take 20 mg by mouth daily as needed for edema (weight gain>3#/24 hours).) 90 tablet 1 Past Week   latanoprost (XALATAN) 0.005 % ophthalmic solution Place 1 drop into the left eye at bedtime.    07/19/2021   levalbuterol (XOPENEX HFA) 45 MCG/ACT inhaler Inhale 2 puffs into the lungs every 4 (four) hours as needed for wheezing. 45 g  2 07/17/2021   MAGnesium-Oxide 400 (240 Mg) MG tablet TAKE 1 TABLET(400 MG) BY MOUTH THREE TIMES DAILY AS NEEDED (Patient taking differently: Take 400 mg by mouth 2 (two) times daily.) 270 tablet 1 07/19/2021   metoprolol tartrate (LOPRESSOR) 25 MG tablet TAKE 1 TABLET BY MOUTH  TWICE DAILY (Patient taking differently: Take 25 mg by mouth 2 (two) times daily.) 180 tablet 3 07/19/2021 at 0900   nitroGLYCERIN (NITROSTAT) 0.4 MG SL tablet Place 1 tablet (0.4 mg total) under the tongue every 5 (five) minutes as needed for chest pain. 30 tablet 0 unk   omeprazole (PRILOSEC OTC) 20 MG tablet Take 20 mg by mouth daily.   07/19/2021   potassium chloride (KLOR-CON) 10 MEQ tablet TAKE 1 TABLET BY MOUTH  DAILY (Patient taking differently: Take 10 mEq by mouth daily.) 90 tablet 3 07/19/2021   propafenone (RYTHMOL) 225 MG tablet TAKE 1 TABLET BY MOUTH  TWICE DAILY (Patient taking differently: Take 225 mg by mouth 2 (two) times daily.) 180 tablet 2 07/19/2021   Ranibizumab (LUCENTIS IO) Inject 1 Dose into the eye once as needed (Macular degeneration).   Past Month   tamsulosin (FLOMAX) 0.4 MG CAPS capsule Take 0.4 mg by mouth 2 (two) times daily.   07/19/2021   clobetasol (TEMOVATE) 0.05 % external solution APPLY 1 APPLICATION TOPICALLY TWICE DAILY (Patient not taking: No sig reported) 100 mL 3    dabigatran (PRADAXA) 150 MG CAPS capsule Take 1 capsule (150 mg total) by mouth 2 (two) times daily. (Patient not taking: No sig reported) 180 capsule 3 Not Taking   oxybutynin (DITROPAN) 5 MG tablet Take 1 tablet (5 mg total) by mouth 3 (three)  times daily as needed for up to 7 days for bladder spasms. 21 tablet 0     Family History  Problem Relation Age of Onset   Heart disease Mother    Diabetes Mother    Cirrhosis Mother    Emphysema Mother        never smoked but 2nd hand through her spouse   Hypertension Mother    Macular degeneration Mother    Heart disease Father    Cancer Father        prostate   Hyperlipidemia Father    Hypertension Father    Varicose Veins Father    Heart attack Father    Peripheral vascular disease Father    Heart disease Sister    Arthritis Sister    Hyperlipidemia Sister    Obesity Sister    Macular degeneration Sister    Heart disease Brother        5 stents   Hyperlipidemia Brother    Macular degeneration Maternal Grandfather    Cirrhosis Sister    Obesity Sister    Arthritis Sister    Heart disease Sister    Obesity Sister    Liver disease Other    Prostate cancer Other    Coronary artery disease Other    Colon cancer Neg Hx    Esophageal cancer Neg Hx    Rectal cancer Neg Hx    Stomach cancer Neg Hx        Physical Exam: BP (!) 161/56   Pulse (!) 50   Temp 98.3 F (36.8 C) (Oral)   Resp 13   Ht 5\' 4"  (1.626 m)   Wt 79.6 kg   SpO2 92%   BMI 30.12 kg/m    General appearance: alert, cooperative, and no distress Head: Normocephalic, without obvious abnormality  Neck: left carotid bruit Resp: clear to auscultation bilaterally Cardio: regular rate and rhythm Genitalia: Foley in place Extremities: edema - Neurologic: Alert and oriented X 3, normal strength and tone. Normal symmetric reflexes. Normal coordination and gait  Diagnostic Studies & Laboratory data:     Recent Radiology Findings:   CARDIAC CATHETERIZATION  Result Date: 07/21/2021 Images from the original result were not included.   Ost LM lesion is 90% stenosed.   Ost Cx to Prox Cx lesion is 60% stenosed.   Prox RCA lesion is 95% stenosed.   Dist RCA lesion is 70% stenosed.   RPDA lesion is 70%  stenosed. MARKEES CARNS is a 75 y.o. male  [id number] LOCATION:  FACILITY: [facility] PHYSICIAN: Quay Burow, M.D. Feb 18, 1946 DATE OF PROCEDURE:  07/21/2021 DATE OF DISCHARGE: CARDIAC CATHETERIZATION History obtained from chart review.  75 year old married Caucasian male with prior history of nonobstructive CAD.  He had a TURP last week and has had some hematuria.  He developed chest pain was brought to the emergency room where he had dynamic ST segment depression.  He was seen by Dr. Acie Fredrickson who felt he needed urgent cardiac cath.   Mr. Barish has left main/three-vessel disease.  His LVEDP is 18.  His systolic blood pressures 734.  He is currently pain-free.  He will need CABG for complete revascularization.  The sheath was removed and a TR band was placed on the right wrist to achieve patent hemostasis.  The patient left lab in stable condition.  I will reheparinize him 4 hours after sheath removal.  T CTS has been consulted. Quay Burow. MD, [facility] 07/21/2021 3:12 PM    DG CHEST PORT 1 VIEW  Result Date: 07/21/2021 CLINICAL DATA:  Mid anterior chest pain EXAM: PORTABLE CHEST 1 VIEW COMPARISON:  Chest radiograph 07/20/2021 FINDINGS: The cardiomediastinal silhouette is stable. The central pulmonary vasculature is prominent, unchanged. Patchy opacities in the right base are increased in conspicuity. Otherwise, there is no focal consolidation or pulmonary edema. There is no pleural effusion or pneumothorax. The bones are stable. IMPRESSION: Slightly increased patchy opacities in the right base may reflect atelectasis or infection. Electronically Signed   By: Valetta Mole M.D.   On: 07/21/2021 11:25   DG Chest Port 1 View  Result Date: 07/20/2021 CLINICAL DATA:  Questionable sepsis, weakness EXAM: PORTABLE CHEST 1 VIEW COMPARISON:  03/27/2021 FINDINGS: Cardiomegaly. Both lungs are clear. The visualized skeletal structures are unremarkable. IMPRESSION: Cardiomegaly without acute abnormality of the lungs in AP  portable projection. Electronically Signed   By: Delanna Ahmadi M.D.   On: 07/20/2021 11:43     I have independently reviewed the above radiologic studies and discussed with the patient   Recent Lab Findings: Lab Results  Component Value Date   WBC 8.7 07/21/2021   HGB 9.9 (L) 07/21/2021   HCT 29.8 (L) 07/21/2021   PLT 160 07/21/2021   GLUCOSE 92 07/21/2021   CHOL 289 (H) 03/24/2021   TRIG 80 03/24/2021   HDL 51 03/24/2021   LDLDIRECT 134.2 12/01/2013   LDLCALC 222 (H) 03/24/2021   ALT 12 07/21/2021   AST 15 07/21/2021   NA 137 07/21/2021   K 4.2 07/21/2021   CL 106 07/21/2021   CREATININE 1.12 07/21/2021   BUN 15 07/21/2021   CO2 28 07/21/2021   TSH 2.38 03/04/2021   INR 1.1 07/20/2021   HGBA1C 5.5 03/24/2021   Assessment / Plan:   Coronary artery disease-significant left main disease included 2. History of CKD (  stage IIIA according to latest labs) Chronic Kidney Disease   Stage I     GFR >90  Stage II    GFR 60-89  Stage IIIA GFR 45-59  Stage IIIB GFR 30-44  Stage IV   GFR 15-29  Stage V    GFR  <15  Lab Results  Component Value Date   CREATININE 1.12 07/21/2021   Estimated Creatinine Clearance: 55.2 mL/min (by C-G formula based on SCr of 1.12 mg/dL).  3. History of TIA-on Dabigatran prior to admission. He last took 4. History of hypertension-on Lopressor 25 mg bid 5. History of PAF (paroxysmal atrial fibrillation) (HCC)-on Propafenone prior to admission 6. History of BPH with obstruction-on Flomax, had cystoscopy/TURP at [place] [facility] on 07/17/2021.    Lars Pinks PA-C 07/21/2021 3:50 PM  Patient seen and examined, agree with above He is a 75 yo man with multiple medical issues including paroxysmal atrial fib. Also a history of rheumatic fever, but no significant valve pathology by echo in June. Recent TURP. Yesterday new onset unstable Cp then another episode today. Cath shows left main and 3 vessel CAD. Brief episode of pain again postcath, currently  pain free.  CABG indicated for survival benefit and relief of symptoms.  I discussed the general nature of the procedure, including the need for general anesthesia, the incisions to be used, the use of cardiopulmonary bypass and drainage tubes and temporary pacing wires with Mr Snelling.  We discussed the expected hospital stay, overall recovery and short and long term outcomes.  I informed him of the indications, risks, benefits and alternatives.  He understand the risks include, but are not limited to death, stroke, MI, DVT/PE, bleeding, possible need for transfusion, infections, cardiac arrhythmias, as well as other organ system dysfunction including respiratory, renal, or GI complications.   He accepts the risks and agrees to proceed.  For OR first case tomorrow AM.   Revonda Standard. Roxan Hockey, MD [facility] [phone]

## 2021-07-21 NOTE — Progress Notes (Addendum)
IV heparin infusing at 1000 units/hour upon arrival to [facility] 6, cath lab holding, safety maintained

## 2021-07-22 ENCOUNTER — Inpatient Hospital Stay (HOSPITAL_COMMUNITY): Payer: Medicare Other

## 2021-07-22 ENCOUNTER — Inpatient Hospital Stay (HOSPITAL_COMMUNITY)
Admission: EM | Disposition: A | Discharge status: Home / Self Care | Payer: Self-pay | Source: Home / Self Care | Attending: Thoracic Surgery (Cardiothoracic Vascular Surgery)

## 2021-07-22 ENCOUNTER — Inpatient Hospital Stay (HOSPITAL_COMMUNITY): Payer: Medicare Other | Admitting: Anesthesiology

## 2021-07-22 ENCOUNTER — Encounter (HOSPITAL_COMMUNITY): Payer: Self-pay | Admitting: Cardiovascular Disease

## 2021-07-22 DIAGNOSIS — Z951 Presence of aortocoronary bypass graft: Secondary | ICD-10-CM

## 2021-07-22 DIAGNOSIS — I5032 Chronic diastolic (congestive) heart failure: Secondary | ICD-10-CM

## 2021-07-22 DIAGNOSIS — T83511A Infection and inflammatory reaction due to indwelling urethral catheter, initial encounter: Secondary | ICD-10-CM

## 2021-07-22 DIAGNOSIS — N189 Chronic kidney disease, unspecified: Secondary | ICD-10-CM

## 2021-07-22 DIAGNOSIS — I480 Paroxysmal atrial fibrillation: Secondary | ICD-10-CM

## 2021-07-22 DIAGNOSIS — I700 Atherosclerosis of aorta: Secondary | ICD-10-CM

## 2021-07-22 DIAGNOSIS — I2510 Atherosclerotic heart disease of native coronary artery without angina pectoris: Secondary | ICD-10-CM

## 2021-07-22 DIAGNOSIS — I25110 Atherosclerotic heart disease of native coronary artery with unstable angina pectoris: Secondary | ICD-10-CM

## 2021-07-22 DIAGNOSIS — I129 Hypertensive chronic kidney disease with stage 1 through stage 4 chronic kidney disease, or unspecified chronic kidney disease: Secondary | ICD-10-CM

## 2021-07-22 DIAGNOSIS — N390 Urinary tract infection, site not specified: Secondary | ICD-10-CM

## 2021-07-22 HISTORY — DX: Presence of aortocoronary bypass graft: Z95.1

## 2021-07-22 HISTORY — PX: CORONARY ARTERY BYPASS GRAFT: SHX141

## 2021-07-22 HISTORY — PX: ENDOVEIN HARVEST OF GREATER SAPHENOUS VEIN: SHX5059

## 2021-07-22 HISTORY — PX: TEE WITHOUT CARDIOVERSION: SHX5443

## 2021-07-22 LAB — GLUCOSE, CAPILLARY
Glucose-Capillary: 127 mg/dL — ABNORMAL HIGH (ref 70–99)
Glucose-Capillary: 127 mg/dL — ABNORMAL HIGH (ref 70–99)
Glucose-Capillary: 107 mg/dL — ABNORMAL HIGH (ref 70–99)
Glucose-Capillary: 102 mg/dL — ABNORMAL HIGH (ref 70–99)
Glucose-Capillary: 110 mg/dL — ABNORMAL HIGH (ref 70–99)
Glucose-Capillary: 106 mg/dL — ABNORMAL HIGH (ref 70–99)
Glucose-Capillary: 113 mg/dL — ABNORMAL HIGH (ref 70–99)

## 2021-07-22 LAB — POCT I-STAT 7, (LYTES, BLD GAS, ICA,H+H)
Acid-Base Excess: 0 mmol/L (ref 0.0–2.0)
Acid-Base Excess: 0 mmol/L (ref 0.0–2.0)
Acid-Base Excess: 0 mmol/L (ref 0.0–2.0)
Acid-Base Excess: 1 mmol/L (ref 0.0–2.0)
Acid-Base Excess: 2 mmol/L (ref 0.0–2.0)
Acid-Base Excess: 0 mmol/L (ref 0.0–2.0)
Acid-Base Excess: 1 mmol/L (ref 0.0–2.0)
Acid-base deficit: 3 mmol/L — ABNORMAL HIGH (ref 0.0–2.0)
Bicarbonate: 22.8 mmol/L (ref 20.0–28.0)
Bicarbonate: 24.6 mmol/L (ref 20.0–28.0)
Bicarbonate: 24.8 mmol/L (ref 20.0–28.0)
Bicarbonate: 26.6 mmol/L (ref 20.0–28.0)
Bicarbonate: 26.5 mmol/L (ref 20.0–28.0)
Bicarbonate: 24.9 mmol/L (ref 20.0–28.0)
Bicarbonate: 25.8 mmol/L (ref 20.0–28.0)
Bicarbonate: 22.4 mmol/L (ref 20.0–28.0)
Calcium, Ion: 1.25 mmol/L (ref 1.15–1.40)
Calcium, Ion: 1.22 mmol/L (ref 1.15–1.40)
Calcium, Ion: 1.06 mmol/L — ABNORMAL LOW (ref 1.15–1.40)
Calcium, Ion: 1.12 mmol/L — ABNORMAL LOW (ref 1.15–1.40)
Calcium, Ion: 1.12 mmol/L — ABNORMAL LOW (ref 1.15–1.40)
Calcium, Ion: 1.13 mmol/L — ABNORMAL LOW (ref 1.15–1.40)
Calcium, Ion: 1.14 mmol/L — ABNORMAL LOW (ref 1.15–1.40)
Calcium, Ion: 1.17 mmol/L (ref 1.15–1.40)
HCT: 26 % — ABNORMAL LOW (ref 39.0–52.0)
HCT: 28 % — ABNORMAL LOW (ref 39.0–52.0)
HCT: 20 % — ABNORMAL LOW (ref 39.0–52.0)
HCT: 23 % — ABNORMAL LOW (ref 39.0–52.0)
HCT: 20 % — ABNORMAL LOW (ref 39.0–52.0)
HCT: 19 % — ABNORMAL LOW (ref 39.0–52.0)
HCT: 24 % — ABNORMAL LOW (ref 39.0–52.0)
HCT: 29 % — ABNORMAL LOW (ref 39.0–52.0)
Hemoglobin: 8.8 g/dL — ABNORMAL LOW (ref 13.0–17.0)
Hemoglobin: 9.5 g/dL — ABNORMAL LOW (ref 13.0–17.0)
Hemoglobin: 6.8 g/dL — CL (ref 13.0–17.0)
Hemoglobin: 7.8 g/dL — ABNORMAL LOW (ref 13.0–17.0)
Hemoglobin: 6.8 g/dL — CL (ref 13.0–17.0)
Hemoglobin: 6.5 g/dL — CL (ref 13.0–17.0)
Hemoglobin: 8.2 g/dL — ABNORMAL LOW (ref 13.0–17.0)
Hemoglobin: 9.9 g/dL — ABNORMAL LOW (ref 13.0–17.0)
O2 Saturation: 92 %
O2 Saturation: 100 %
O2 Saturation: 100 %
O2 Saturation: 100 %
O2 Saturation: 100 %
O2 Saturation: 100 %
O2 Saturation: 100 %
O2 Saturation: 97 %
Patient temperature: 97.9
Patient temperature: 35.9
Potassium: 3.7 mmol/L (ref 3.5–5.1)
Potassium: 4 mmol/L (ref 3.5–5.1)
Potassium: 4.1 mmol/L (ref 3.5–5.1)
Potassium: 5 mmol/L (ref 3.5–5.1)
Potassium: 4.8 mmol/L (ref 3.5–5.1)
Potassium: 5 mmol/L (ref 3.5–5.1)
Potassium: 5.2 mmol/L — ABNORMAL HIGH (ref 3.5–5.1)
Potassium: 4.5 mmol/L (ref 3.5–5.1)
Sodium: 139 mmol/L (ref 135–145)
Sodium: 140 mmol/L (ref 135–145)
Sodium: 138 mmol/L (ref 135–145)
Sodium: 138 mmol/L (ref 135–145)
Sodium: 136 mmol/L (ref 135–145)
Sodium: 136 mmol/L (ref 135–145)
Sodium: 137 mmol/L (ref 135–145)
Sodium: 137 mmol/L (ref 135–145)
TCO2: 24 mmol/L (ref 22–32)
TCO2: 26 mmol/L (ref 22–32)
TCO2: 26 mmol/L (ref 22–32)
TCO2: 28 mmol/L (ref 22–32)
TCO2: 28 mmol/L (ref 22–32)
TCO2: 26 mmol/L (ref 22–32)
TCO2: 27 mmol/L (ref 22–32)
TCO2: 24 mmol/L (ref 22–32)
pCO2 arterial: 30.1 mmHg — ABNORMAL LOW (ref 32.0–48.0)
pCO2 arterial: 36.9 mmHg (ref 32.0–48.0)
pCO2 arterial: 38.3 mmHg (ref 32.0–48.0)
pCO2 arterial: 44.6 mmHg (ref 32.0–48.0)
pCO2 arterial: 39.4 mmHg (ref 32.0–48.0)
pCO2 arterial: 40.9 mmHg (ref 32.0–48.0)
pCO2 arterial: 42.7 mmHg (ref 32.0–48.0)
pCO2 arterial: 40.8 mmHg (ref 32.0–48.0)
pH, Arterial: 7.485 — ABNORMAL HIGH (ref 7.350–7.450)
pH, Arterial: 7.432 (ref 7.350–7.450)
pH, Arterial: 7.383 (ref 7.350–7.450)
pH, Arterial: 7.42 (ref 7.350–7.450)
pH, Arterial: 7.437 (ref 7.350–7.450)
pH, Arterial: 7.389 (ref 7.350–7.450)
pH, Arterial: 7.392 (ref 7.350–7.450)
pH, Arterial: 7.342 — ABNORMAL LOW (ref 7.350–7.450)
pO2, Arterial: 57 mmHg — ABNORMAL LOW (ref 83.0–108.0)
pO2, Arterial: 199 mmHg — ABNORMAL HIGH (ref 83.0–108.0)
pO2, Arterial: 279 mmHg — ABNORMAL HIGH (ref 83.0–108.0)
pO2, Arterial: 464 mmHg — ABNORMAL HIGH (ref 83.0–108.0)
pO2, Arterial: 325 mmHg — ABNORMAL HIGH (ref 83.0–108.0)
pO2, Arterial: 211 mmHg — ABNORMAL HIGH (ref 83.0–108.0)
pO2, Arterial: 319 mmHg — ABNORMAL HIGH (ref 83.0–108.0)
pO2, Arterial: 91 mmHg (ref 83.0–108.0)

## 2021-07-22 LAB — POCT I-STAT EG7
Acid-base deficit: 2 mmol/L (ref 0.0–2.0)
Bicarbonate: 23.8 mmol/L (ref 20.0–28.0)
Calcium, Ion: 1.09 mmol/L — ABNORMAL LOW (ref 1.15–1.40)
HCT: 22 % — ABNORMAL LOW (ref 39.0–52.0)
Hemoglobin: 7.5 g/dL — ABNORMAL LOW (ref 13.0–17.0)
O2 Saturation: 81 %
Potassium: 4.1 mmol/L (ref 3.5–5.1)
Sodium: 139 mmol/L (ref 135–145)
TCO2: 25 mmol/L (ref 22–32)
pCO2, Ven: 42.1 mmHg — ABNORMAL LOW (ref 44.0–60.0)
pH, Ven: 7.361 (ref 7.250–7.430)
pO2, Ven: 48 mmHg — ABNORMAL HIGH (ref 32.0–45.0)

## 2021-07-22 LAB — BASIC METABOLIC PANEL
Anion gap: 10 (ref 5–15)
Anion gap: 5 (ref 5–15)
BUN: 12 mg/dL (ref 8–23)
BUN: 10 mg/dL (ref 8–23)
CO2: 18 mmol/L — ABNORMAL LOW (ref 22–32)
CO2: 21 mmol/L — ABNORMAL LOW (ref 22–32)
Calcium: 8.8 mg/dL — ABNORMAL LOW (ref 8.9–10.3)
Calcium: 7.7 mg/dL — ABNORMAL LOW (ref 8.9–10.3)
Chloride: 108 mmol/L (ref 98–111)
Chloride: 109 mmol/L (ref 98–111)
Creatinine, Ser: 1.13 mg/dL (ref 0.61–1.24)
Creatinine, Ser: 1.05 mg/dL (ref 0.61–1.24)
GFR, Estimated: >60 mL/min (ref >60)
GFR, Estimated: >60 mL/min (ref >60)
Glucose, Bld: 88 mg/dL (ref 70–99)
Glucose, Bld: 116 mg/dL — ABNORMAL HIGH (ref 70–99)
Potassium: 3.8 mmol/L (ref 3.5–5.1)
Potassium: 4.4 mmol/L (ref 3.5–5.1)
Sodium: 136 mmol/L (ref 135–145)
Sodium: 135 mmol/L (ref 135–145)

## 2021-07-22 LAB — ECHO INTRAOPERATIVE TEE
Height: 64 in
Weight: 2807.78 oz

## 2021-07-22 LAB — POCT I-STAT, CHEM 8
BUN: 10 mg/dL (ref 8–23)
BUN: 10 mg/dL (ref 8–23)
BUN: 10 mg/dL (ref 8–23)
BUN: 9 mg/dL (ref 8–23)
Calcium, Ion: 1.26 mmol/L (ref 1.15–1.40)
Calcium, Ion: 1.11 mmol/L — ABNORMAL LOW (ref 1.15–1.40)
Calcium, Ion: 1.11 mmol/L — ABNORMAL LOW (ref 1.15–1.40)
Calcium, Ion: 1.13 mmol/L — ABNORMAL LOW (ref 1.15–1.40)
Chloride: 103 mmol/L (ref 98–111)
Chloride: 102 mmol/L (ref 98–111)
Chloride: 102 mmol/L (ref 98–111)
Chloride: 103 mmol/L (ref 98–111)
Creatinine, Ser: 1 mg/dL (ref 0.61–1.24)
Creatinine, Ser: 0.9 mg/dL (ref 0.61–1.24)
Creatinine, Ser: 0.9 mg/dL (ref 0.61–1.24)
Creatinine, Ser: 0.9 mg/dL (ref 0.61–1.24)
Glucose, Bld: 103 mg/dL — ABNORMAL HIGH (ref 70–99)
Glucose, Bld: 107 mg/dL — ABNORMAL HIGH (ref 70–99)
Glucose, Bld: 120 mg/dL — ABNORMAL HIGH (ref 70–99)
Glucose, Bld: 122 mg/dL — ABNORMAL HIGH (ref 70–99)
HCT: 26 % — ABNORMAL LOW (ref 39.0–52.0)
HCT: 20 % — ABNORMAL LOW (ref 39.0–52.0)
HCT: 24 % — ABNORMAL LOW (ref 39.0–52.0)
HCT: 25 % — ABNORMAL LOW (ref 39.0–52.0)
Hemoglobin: 8.8 g/dL — ABNORMAL LOW (ref 13.0–17.0)
Hemoglobin: 6.8 g/dL — CL (ref 13.0–17.0)
Hemoglobin: 8.2 g/dL — ABNORMAL LOW (ref 13.0–17.0)
Hemoglobin: 8.5 g/dL — ABNORMAL LOW (ref 13.0–17.0)
Potassium: 3.7 mmol/L (ref 3.5–5.1)
Potassium: 5 mmol/L (ref 3.5–5.1)
Potassium: 5 mmol/L (ref 3.5–5.1)
Potassium: 5.2 mmol/L — ABNORMAL HIGH (ref 3.5–5.1)
Sodium: 138 mmol/L (ref 135–145)
Sodium: 136 mmol/L (ref 135–145)
Sodium: 136 mmol/L (ref 135–145)
Sodium: 136 mmol/L (ref 135–145)
TCO2: 26 mmol/L (ref 22–32)
TCO2: 25 mmol/L (ref 22–32)
TCO2: 25 mmol/L (ref 22–32)
TCO2: 26 mmol/L (ref 22–32)

## 2021-07-22 LAB — CBC
HCT: 39.3 % (ref 39.0–52.0)
HCT: 29.6 % — ABNORMAL LOW (ref 39.0–52.0)
HCT: 32.7 % — ABNORMAL LOW (ref 39.0–52.0)
Hemoglobin: 12.5 g/dL — ABNORMAL LOW (ref 13.0–17.0)
Hemoglobin: 9.8 g/dL — ABNORMAL LOW (ref 13.0–17.0)
Hemoglobin: 10.9 g/dL — ABNORMAL LOW (ref 13.0–17.0)
MCH: 31.3 pg (ref 26.0–34.0)
MCH: 31.1 pg (ref 26.0–34.0)
MCH: 31.1 pg (ref 26.0–34.0)
MCHC: 31.8 g/dL (ref 30.0–36.0)
MCHC: 33.1 g/dL (ref 30.0–36.0)
MCHC: 33.3 g/dL (ref 30.0–36.0)
MCV: 98.5 fL (ref 80.0–100.0)
MCV: 94 fL (ref 80.0–100.0)
MCV: 93.2 fL (ref 80.0–100.0)
Platelets: 138 10*3/uL — ABNORMAL LOW (ref 150–400)
Platelets: 109 10*3/uL — ABNORMAL LOW (ref 150–400)
Platelets: 141 10*3/uL — ABNORMAL LOW (ref 150–400)
RBC: 3.99 MIL/uL — ABNORMAL LOW (ref 4.22–5.81)
RBC: 3.15 MIL/uL — ABNORMAL LOW (ref 4.22–5.81)
RBC: 3.51 MIL/uL — ABNORMAL LOW (ref 4.22–5.81)
RDW: 12.4 % (ref 11.5–15.5)
RDW: 13.1 % (ref 11.5–15.5)
RDW: 13.2 % (ref 11.5–15.5)
WBC: 8.5 10*3/uL (ref 4.0–10.5)
WBC: 15.8 10*3/uL — ABNORMAL HIGH (ref 4.0–10.5)
WBC: 20.2 10*3/uL — ABNORMAL HIGH (ref 4.0–10.5)
nRBC: 0 % (ref 0.0–0.2)
nRBC: 0 % (ref 0.0–0.2)
nRBC: 0 % (ref 0.0–0.2)

## 2021-07-22 LAB — URINALYSIS, ROUTINE W REFLEX MICROSCOPIC
Bilirubin Urine: NEGATIVE
Glucose, UA: NEGATIVE mg/dL
Ketones, ur: 5 mg/dL — AB
Nitrite: NEGATIVE
Protein, ur: 100 mg/dL — AB
RBC / HPF: >50 RBC/hpf — ABNORMAL HIGH (ref 0–5)
RBC / HPF: >50 RBC/hpf — ABNORMAL HIGH (ref 0–5)
Specific Gravity, Urine: 1.033 — ABNORMAL HIGH (ref 1.005–1.030)
WBC, UA: >50 WBC/hpf — ABNORMAL HIGH (ref 0–5)
pH: 6 (ref 5.0–8.0)

## 2021-07-22 LAB — APTT: aPTT: 32 seconds (ref 24–36)

## 2021-07-22 LAB — PROTIME-INR
INR: 1.4 — ABNORMAL HIGH (ref 0.8–1.2)
Prothrombin Time: 17.4 seconds — ABNORMAL HIGH (ref 11.4–15.2)

## 2021-07-22 LAB — SURGICAL PCR SCREEN
MRSA, PCR: NEGATIVE
Staphylococcus aureus: NEGATIVE

## 2021-07-22 LAB — PREPARE RBC (CROSSMATCH)

## 2021-07-22 LAB — HEMOGLOBIN AND HEMATOCRIT, BLOOD
HCT: 20.6 % — ABNORMAL LOW (ref 39.0–52.0)
Hemoglobin: 6.7 g/dL — CL (ref 13.0–17.0)

## 2021-07-22 LAB — MAGNESIUM: Magnesium: 3 mg/dL — ABNORMAL HIGH (ref 1.7–2.4)

## 2021-07-22 LAB — PLATELET COUNT: Platelets: 107 10*3/uL — ABNORMAL LOW (ref 150–400)

## 2021-07-22 SURGERY — CORONARY ARTERY BYPASS GRAFTING (CABG)
Anesthesia: General | Site: Chest

## 2021-07-22 MED ORDER — ROCURONIUM BROMIDE 10 MG/ML (PF) SYRINGE
PREFILLED_SYRINGE | INTRAVENOUS | Status: DC | PRN
  Administered 2021-07-22: 100 mg via INTRAVENOUS
  Administered 2021-07-22: 60 mg via INTRAVENOUS

## 2021-07-22 MED ORDER — SODIUM CHLORIDE 0.9% FLUSH
3.0000 mL | INTRAVENOUS | Status: DC | PRN
Start: 2021-07-23 — End: 2021-07-29

## 2021-07-22 MED ORDER — MELATONIN 3 MG PO TABS
3.0000 mg | ORAL_TABLET | Freq: Every day | ORAL | Status: DC
  Administered 2021-07-22: 3 mg via ORAL
  Filled 2021-07-22: qty 1

## 2021-07-22 MED ORDER — PROTAMINE SULFATE 10 MG/ML IV SOLN
INTRAVENOUS | Status: AC
  Filled 2021-07-22: qty 5

## 2021-07-22 MED ORDER — MIDAZOLAM HCL (PF) 10 MG/2ML IJ SOLN
INTRAMUSCULAR | Status: AC
  Filled 2021-07-22: qty 2

## 2021-07-22 MED ORDER — PANTOPRAZOLE SODIUM 40 MG PO TBEC
40.0000 mg | DELAYED_RELEASE_TABLET | Freq: Every day | ORAL | Status: DC
  Administered 2021-07-24 – 2021-08-01 (×9): 40 mg via ORAL
  Filled 2021-07-22 (×9): qty 1

## 2021-07-22 MED ORDER — PROPOFOL 10 MG/ML IV BOLUS
INTRAVENOUS | Status: DC | PRN
  Administered 2021-07-22: 20 mg via INTRAVENOUS
  Administered 2021-07-22: 130 mg via INTRAVENOUS

## 2021-07-22 MED ORDER — LACTATED RINGERS IV SOLN: INTRAVENOUS | Status: DC | PRN

## 2021-07-22 MED ORDER — NITROGLYCERIN IN D5W 200-5 MCG/ML-% IV SOLN: 0.0000 ug/min | INTRAVENOUS | Status: DC

## 2021-07-22 MED ORDER — DOCUSATE SODIUM 100 MG PO CAPS
200.0000 mg | ORAL_CAPSULE | Freq: Every day | ORAL | Status: DC
  Administered 2021-07-23 – 2021-08-01 (×10): 200 mg via ORAL
  Filled 2021-07-22 (×10): qty 2

## 2021-07-22 MED ORDER — CEFAZOLIN SODIUM 1 G IJ SOLR
INTRAMUSCULAR | Status: AC
  Filled 2021-07-22: qty 20

## 2021-07-22 MED ORDER — NITROGLYCERIN 0.2 MG/ML ON CALL CATH LAB
INTRAVENOUS | Status: DC | PRN
  Administered 2021-07-22: 40 ug via INTRAVENOUS
  Administered 2021-07-22: 20 ug via INTRAVENOUS
  Administered 2021-07-22: 40 ug via INTRAVENOUS
  Administered 2021-07-22: 100 ug via INTRAVENOUS
  Administered 2021-07-22: 30 ug via INTRAVENOUS

## 2021-07-22 MED ORDER — SODIUM CHLORIDE (PF) 0.9 % IJ SOLN
OROMUCOSAL | Status: DC | PRN
  Administered 2021-07-22 (×3): 4 mL via TOPICAL

## 2021-07-22 MED ORDER — PROPOFOL 10 MG/ML IV BOLUS
INTRAVENOUS | Status: AC
  Filled 2021-07-22: qty 20

## 2021-07-22 MED ORDER — TRAMADOL HCL 50 MG PO TABS
50.0000 mg | ORAL_TABLET | ORAL | Status: DC | PRN
  Administered 2021-07-22 – 2021-07-23 (×4): 100 mg via ORAL
  Filled 2021-07-22 (×4): qty 2

## 2021-07-22 MED ORDER — INSULIN ASPART 100 UNIT/ML IJ SOLN
0.0000 [IU] | INTRAMUSCULAR | Status: DC
  Administered 2021-07-23 – 2021-07-24 (×6): 2 [IU] via SUBCUTANEOUS

## 2021-07-22 MED ORDER — ASPIRIN EC 325 MG PO TBEC
325.0000 mg | DELAYED_RELEASE_TABLET | Freq: Every day | ORAL | Status: DC
  Administered 2021-07-23 – 2021-07-24 (×2): 325 mg via ORAL
  Filled 2021-07-22 (×2): qty 1

## 2021-07-22 MED ORDER — HEPARIN SODIUM (PORCINE) 1000 UNIT/ML IJ SOLN
INTRAMUSCULAR | Status: DC | PRN
  Administered 2021-07-22: 26000 [IU] via INTRAVENOUS
  Administered 2021-07-22: 2000 [IU] via INTRAVENOUS

## 2021-07-22 MED ORDER — CHLORHEXIDINE GLUCONATE 0.12 % MT SOLN
15.0000 mL | OROMUCOSAL | Status: AC
  Administered 2021-07-22: 15 mL via OROMUCOSAL

## 2021-07-22 MED ORDER — POTASSIUM CHLORIDE 10 MEQ/50ML IV SOLN: 10.0000 meq | INTRAVENOUS | Status: AC

## 2021-07-22 MED ORDER — LIDOCAINE 2% (20 MG/ML) 5 ML SYRINGE
INTRAMUSCULAR | Status: DC | PRN
  Administered 2021-07-22: 100 mg via INTRAVENOUS

## 2021-07-22 MED ORDER — BISACODYL 5 MG PO TBEC
10.0000 mg | DELAYED_RELEASE_TABLET | Freq: Every day | ORAL | Status: DC
  Administered 2021-07-23 – 2021-07-27 (×4): 10 mg via ORAL
  Filled 2021-07-22 (×4): qty 2

## 2021-07-22 MED ORDER — ASPIRIN 81 MG PO CHEW
324.0000 mg | CHEWABLE_TABLET | Freq: Every day | ORAL | Status: DC
  Filled 2021-07-22: qty 4

## 2021-07-22 MED ORDER — ACETAMINOPHEN 160 MG/5ML PO SOLN: 1000.0000 mg | Freq: Four times a day (QID) | ORAL | Status: DC

## 2021-07-22 MED ORDER — LACTATED RINGERS IV SOLN: INTRAVENOUS | Status: DC

## 2021-07-22 MED ORDER — ROCURONIUM BROMIDE 10 MG/ML (PF) SYRINGE
PREFILLED_SYRINGE | INTRAVENOUS | Status: AC
  Filled 2021-07-22: qty 10

## 2021-07-22 MED ORDER — METOPROLOL TARTRATE 5 MG/5ML IV SOLN
2.5000 mg | INTRAVENOUS | Status: DC | PRN
  Administered 2021-07-24: 5 mg via INTRAVENOUS
  Administered 2021-07-26 (×2): 2.5 mg via INTRAVENOUS
  Filled 2021-07-22 (×2): qty 5

## 2021-07-22 MED ORDER — SODIUM CHLORIDE 0.9% FLUSH: 10.0000 mL | INTRAVENOUS | Status: DC | PRN

## 2021-07-22 MED ORDER — METOPROLOL TARTRATE 12.5 MG HALF TABLET
12.5000 mg | ORAL_TABLET | Freq: Two times a day (BID) | ORAL | Status: DC
  Administered 2021-07-22 – 2021-07-23 (×3): 12.5 mg via ORAL
  Filled 2021-07-22 (×3): qty 1

## 2021-07-22 MED ORDER — CHLORHEXIDINE GLUCONATE CLOTH 2 % EX PADS
6.0000 | MEDICATED_PAD | Freq: Every day | CUTANEOUS | Status: DC
  Administered 2021-07-22 – 2021-07-29 (×8): 6 via TOPICAL

## 2021-07-22 MED ORDER — SODIUM CHLORIDE 0.9% FLUSH
3.0000 mL | Freq: Two times a day (BID) | INTRAVENOUS | Status: DC
  Administered 2021-07-23 – 2021-07-29 (×13): 3 mL via INTRAVENOUS

## 2021-07-22 MED ORDER — PROTAMINE SULFATE 10 MG/ML IV SOLN
INTRAVENOUS | Status: AC
  Filled 2021-07-22: qty 25

## 2021-07-22 MED ORDER — DEXMEDETOMIDINE HCL IN NACL 400 MCG/100ML IV SOLN
0.0000 ug/kg/h | INTRAVENOUS | Status: DC
  Administered 2021-07-23: 0.2 ug/kg/h via INTRAVENOUS
  Filled 2021-07-22: qty 100

## 2021-07-22 MED ORDER — SODIUM CHLORIDE 0.9% FLUSH
10.0000 mL | Freq: Two times a day (BID) | INTRAVENOUS | Status: DC
  Administered 2021-07-22 – 2021-07-23 (×3): 10 mL
  Administered 2021-07-24: 20 mL

## 2021-07-22 MED ORDER — ACETAMINOPHEN 160 MG/5ML PO SOLN: 650.0000 mg | Freq: Once | ORAL | Status: AC

## 2021-07-22 MED ORDER — ONDANSETRON HCL 4 MG/2ML IJ SOLN
4.0000 mg | Freq: Four times a day (QID) | INTRAMUSCULAR | Status: DC | PRN
  Administered 2021-07-22 – 2021-07-25 (×6): 4 mg via INTRAVENOUS
  Filled 2021-07-22 (×7): qty 2

## 2021-07-22 MED ORDER — SODIUM CHLORIDE 0.9 % IV SOLN: 250.0000 mL | INTRAVENOUS | Status: DC

## 2021-07-22 MED ORDER — ORAL CARE MOUTH RINSE
15.0000 mL | OROMUCOSAL | Status: DC
  Administered 2021-07-22: 15 mL via OROMUCOSAL

## 2021-07-22 MED ORDER — FAMOTIDINE IN NACL 20-0.9 MG/50ML-% IV SOLN
20.0000 mg | Freq: Two times a day (BID) | INTRAVENOUS | Status: AC
  Administered 2021-07-22 (×2): 20 mg via INTRAVENOUS
  Filled 2021-07-22 (×2): qty 50

## 2021-07-22 MED ORDER — SODIUM CHLORIDE 0.45 % IV SOLN: INTRAVENOUS | Status: DC | PRN

## 2021-07-22 MED ORDER — SODIUM BICARBONATE 8.4 % IV SOLN
50.0000 meq | Freq: Once | INTRAVENOUS | Status: AC
  Administered 2021-07-22: 50 meq via INTRAVENOUS

## 2021-07-22 MED ORDER — FENTANYL CITRATE PF 50 MCG/ML IJ SOSY
50.0000 ug | PREFILLED_SYRINGE | INTRAMUSCULAR | Status: DC | PRN
Start: 2021-07-22 — End: 2021-07-27
  Administered 2021-07-22 – 2021-07-23 (×8): 50 ug via INTRAVENOUS
  Administered 2021-07-24: 100 ug via INTRAVENOUS
  Administered 2021-07-24 (×2): 50 ug via INTRAVENOUS
  Administered 2021-07-25: 100 ug via INTRAVENOUS
  Filled 2021-07-22 (×5): qty 1
  Filled 2021-07-22: qty 2
  Filled 2021-07-22: qty 1
  Filled 2021-07-22: qty 2
  Filled 2021-07-22 (×3): qty 1
  Filled 2021-07-22: qty 2

## 2021-07-22 MED ORDER — MIDAZOLAM HCL 2 MG/2ML IJ SOLN
2.0000 mg | INTRAMUSCULAR | Status: DC | PRN
  Filled 2021-07-22: qty 2

## 2021-07-22 MED ORDER — ACETAMINOPHEN 500 MG PO TABS
1000.0000 mg | ORAL_TABLET | Freq: Four times a day (QID) | ORAL | Status: AC
  Administered 2021-07-22 – 2021-07-26 (×9): 1000 mg via ORAL
  Filled 2021-07-22 (×11): qty 2

## 2021-07-22 MED ORDER — ACETAMINOPHEN 650 MG RE SUPP
650.0000 mg | Freq: Once | RECTAL | Status: AC
  Administered 2021-07-22: 650 mg via RECTAL

## 2021-07-22 MED ORDER — BISACODYL 10 MG RE SUPP
10.0000 mg | Freq: Every day | RECTAL | Status: DC
  Administered 2021-07-25: 10 mg via RECTAL
  Filled 2021-07-22: qty 1

## 2021-07-22 MED ORDER — PHENYLEPHRINE 40 MCG/ML (10ML) SYRINGE FOR IV PUSH (FOR BLOOD PRESSURE SUPPORT)
PREFILLED_SYRINGE | INTRAVENOUS | Status: DC | PRN
  Administered 2021-07-22 (×3): 40 ug via INTRAVENOUS
  Administered 2021-07-22: 80 ug via INTRAVENOUS
  Administered 2021-07-22: 20 ug via INTRAVENOUS
  Administered 2021-07-22 (×3): 40 ug via INTRAVENOUS
  Administered 2021-07-22: 80 ug via INTRAVENOUS

## 2021-07-22 MED ORDER — HEMOSTATIC AGENTS (NO CHARGE) OPTIME
TOPICAL | Status: DC | PRN
  Administered 2021-07-22: 1 via TOPICAL

## 2021-07-22 MED ORDER — PHENYLEPHRINE HCL-NACL 20-0.9 MG/250ML-% IV SOLN: 0.0000 ug/min | INTRAVENOUS | Status: DC

## 2021-07-22 MED ORDER — 0.9 % SODIUM CHLORIDE (POUR BTL) OPTIME
TOPICAL | Status: DC | PRN
  Administered 2021-07-22: 5000 mL

## 2021-07-22 MED ORDER — METOPROLOL TARTRATE 25 MG/10 ML ORAL SUSPENSION: 12.5000 mg | Freq: Two times a day (BID) | ORAL | Status: DC

## 2021-07-22 MED ORDER — MAGNESIUM SULFATE 4 GM/100ML IV SOLN
4.0000 g | Freq: Once | INTRAVENOUS | Status: AC
  Administered 2021-07-22: 4 g via INTRAVENOUS
  Filled 2021-07-22: qty 100

## 2021-07-22 MED ORDER — CEFAZOLIN SODIUM-DEXTROSE 2-4 GM/100ML-% IV SOLN
2.0000 g | Freq: Three times a day (TID) | INTRAVENOUS | Status: AC
  Administered 2021-07-22 – 2021-07-24 (×6): 2 g via INTRAVENOUS
  Filled 2021-07-22 (×6): qty 100

## 2021-07-22 MED ORDER — FENTANYL CITRATE (PF) 250 MCG/5ML IJ SOLN
INTRAMUSCULAR | Status: AC
  Filled 2021-07-22: qty 25

## 2021-07-22 MED ORDER — PROTAMINE SULFATE 10 MG/ML IV SOLN
INTRAVENOUS | Status: DC | PRN
  Administered 2021-07-22: 280 mg via INTRAVENOUS
  Administered 2021-07-22: 25 mg via INTRAVENOUS

## 2021-07-22 MED ORDER — SODIUM CHLORIDE 0.9 % IV SOLN: INTRAVENOUS | Status: DC

## 2021-07-22 MED ORDER — FENTANYL CITRATE (PF) 250 MCG/5ML IJ SOLN
INTRAMUSCULAR | Status: DC | PRN
  Administered 2021-07-22 (×3): 100 ug via INTRAVENOUS
  Administered 2021-07-22: 50 ug via INTRAVENOUS
  Administered 2021-07-22: 150 ug via INTRAVENOUS
  Administered 2021-07-22 (×2): 100 ug via INTRAVENOUS
  Administered 2021-07-22: 150 ug via INTRAVENOUS
  Administered 2021-07-22 (×2): 100 ug via INTRAVENOUS
  Administered 2021-07-22: 175 ug via INTRAVENOUS
  Administered 2021-07-22: 25 ug via INTRAVENOUS

## 2021-07-22 MED ORDER — SODIUM CHLORIDE 0.9 % IV SOLN: INTRAVENOUS | Status: DC | PRN

## 2021-07-22 MED ORDER — VANCOMYCIN HCL IN DEXTROSE 1-5 GM/200ML-% IV SOLN
1000.0000 mg | Freq: Once | INTRAVENOUS | Status: AC
  Administered 2021-07-22: 1000 mg via INTRAVENOUS

## 2021-07-22 MED ORDER — ROCURONIUM BROMIDE 10 MG/ML (PF) SYRINGE
PREFILLED_SYRINGE | INTRAVENOUS | Status: AC
  Filled 2021-07-22: qty 290

## 2021-07-22 MED ORDER — DEXTROSE 50 % IV SOLN: 0.0000 mL | INTRAVENOUS | Status: DC | PRN

## 2021-07-22 MED ORDER — CHLORHEXIDINE GLUCONATE 0.12% ORAL RINSE (MEDLINE KIT): 15.0000 mL | Freq: Two times a day (BID) | OROMUCOSAL | Status: DC

## 2021-07-22 MED ORDER — INSULIN REGULAR(HUMAN) IN NACL 100-0.9 UT/100ML-% IV SOLN
INTRAVENOUS | Status: DC
  Administered 2021-07-22: 1.3 [IU]/h via INTRAVENOUS

## 2021-07-22 MED ORDER — HEPARIN SODIUM (PORCINE) 1000 UNIT/ML IJ SOLN
INTRAMUSCULAR | Status: AC
  Filled 2021-07-22: qty 1

## 2021-07-22 MED ORDER — LIDOCAINE 2% (20 MG/ML) 5 ML SYRINGE
INTRAMUSCULAR | Status: AC
  Filled 2021-07-22: qty 5

## 2021-07-22 MED ORDER — LACTATED RINGERS IV SOLN: 500.0000 mL | Freq: Once | INTRAVENOUS | Status: DC | PRN

## 2021-07-22 MED ORDER — MIDAZOLAM HCL (PF) 5 MG/ML IJ SOLN
INTRAMUSCULAR | Status: DC | PRN
  Administered 2021-07-22: 1 mg via INTRAVENOUS
  Administered 2021-07-22: 2 mg via INTRAVENOUS
  Administered 2021-07-22 (×2): 1 mg via INTRAVENOUS

## 2021-07-22 MED FILL — Morphine Sulfate Inj 4 MG/ML: INTRAMUSCULAR | Qty: 0.5 | Status: AC

## 2021-07-22 SURGICAL SUPPLY — 83 items
BAG DECANTER FOR FLEXI CONT (MISCELLANEOUS) ×5 IMPLANT
BLADE CLIPPER SURG (BLADE) ×5 IMPLANT
BLADE STERNUM SYSTEM 6 (BLADE) ×5 IMPLANT
BNDG ELASTIC 4X5.8 VLCR STR LF (GAUZE/BANDAGES/DRESSINGS) ×5 IMPLANT
BNDG ELASTIC 6X5.8 VLCR STR LF (GAUZE/BANDAGES/DRESSINGS) ×5 IMPLANT
BNDG GAUZE ELAST 4 BULKY (GAUZE/BANDAGES/DRESSINGS) ×5 IMPLANT
CABLE PACING FASLOC BIEGE (MISCELLANEOUS) ×2 IMPLANT
CANISTER SUCT 3000ML PPV (MISCELLANEOUS) ×5 IMPLANT
CANNULA EZ GLIDE AORTIC 21FR (CANNULA) ×5 IMPLANT
CATH CPB KIT HENDRICKSON (MISCELLANEOUS) ×5 IMPLANT
CATH ROBINSON RED A/P 18FR (CATHETERS) ×5 IMPLANT
CATH THORACIC 36FR (CATHETERS) ×5 IMPLANT
CATH THORACIC 36FR RT ANG (CATHETERS) ×5 IMPLANT
CLIP VESOCCLUDE MED 24/CT (CLIP) IMPLANT
CLIP VESOCCLUDE SM WIDE 24/CT (CLIP) ×2 IMPLANT
CONTAINER PROTECT SURGISLUSH (MISCELLANEOUS) ×5 IMPLANT
DRAPE CARDIOVASCULAR INCISE (DRAPES) ×5
DRAPE SRG 135X102X78XABS (DRAPES) ×3 IMPLANT
DRAPE WARM FLUID 44X44 (DRAPES) ×5 IMPLANT
DRSG COVADERM 4X14 (GAUZE/BANDAGES/DRESSINGS) ×5 IMPLANT
ELECT REM PT RETURN 9FT ADLT (ELECTROSURGICAL) ×10
ELECTRODE REM PT RTRN 9FT ADLT (ELECTROSURGICAL) ×6 IMPLANT
FELT TEFLON 1X6 (MISCELLANEOUS) ×8 IMPLANT
GAUZE SPONGE 4X4 12PLY STRL (GAUZE/BANDAGES/DRESSINGS) ×10 IMPLANT
GAUZE SPONGE 4X4 12PLY STRL LF (GAUZE/BANDAGES/DRESSINGS) ×4 IMPLANT
GLOVE SURG MICRO LTX SZ6.5 (GLOVE) ×2 IMPLANT
GLOVE SURG SIGNA 7.5 PF LTX (GLOVE) ×15 IMPLANT
GOWN STRL REUS W/ TWL LRG LVL3 (GOWN DISPOSABLE) ×12 IMPLANT
GOWN STRL REUS W/ TWL XL LVL3 (GOWN DISPOSABLE) ×6 IMPLANT
GOWN STRL REUS W/TWL LRG LVL3 (GOWN DISPOSABLE) ×40
GOWN STRL REUS W/TWL XL LVL3 (GOWN DISPOSABLE) ×15
HEMOSTAT POWDER SURGIFOAM 1G (HEMOSTASIS) ×15 IMPLANT
HEMOSTAT SURGICEL 2X14 (HEMOSTASIS) ×5 IMPLANT
INSERT FOGARTY XLG (MISCELLANEOUS) IMPLANT
KIT BASIN OR (CUSTOM PROCEDURE TRAY) ×5 IMPLANT
KIT SUCTION CATH 14FR (SUCTIONS) ×10 IMPLANT
KIT TURNOVER KIT B (KITS) ×5 IMPLANT
KIT VASOVIEW HEMOPRO 2 VH 4000 (KITS) ×5 IMPLANT
MARKER GRAFT CORONARY BYPASS (MISCELLANEOUS) ×15 IMPLANT
NS IRRIG 1000ML POUR BTL (IV SOLUTION) ×25 IMPLANT
PACK E OPEN HEART (SUTURE) ×5 IMPLANT
PACK OPEN HEART (CUSTOM PROCEDURE TRAY) ×5 IMPLANT
PAD ARMBOARD 7.5X6 YLW CONV (MISCELLANEOUS) ×10 IMPLANT
PAD ELECT DEFIB RADIOL ZOLL (MISCELLANEOUS) ×5 IMPLANT
PENCIL BUTTON HOLSTER BLD 10FT (ELECTRODE) ×5 IMPLANT
POSITIONER HEAD DONUT 9IN (MISCELLANEOUS) ×5 IMPLANT
PUNCH AORTIC ROTATE 4.0MM (MISCELLANEOUS) IMPLANT
PUNCH AORTIC ROTATE 4.5MM 8IN (MISCELLANEOUS) ×2 IMPLANT
PUNCH AORTIC ROTATE 5MM 8IN (MISCELLANEOUS) IMPLANT
SENSOR MYOCARDIAL TEMP (MISCELLANEOUS) ×2 IMPLANT
SET MPS 3-ND DEL (MISCELLANEOUS) ×2 IMPLANT
SUPPORT HEART JANKE-BARRON (MISCELLANEOUS) ×5 IMPLANT
SUT BONE WAX W31G (SUTURE) ×5 IMPLANT
SUT MNCRL AB 4-0 PS2 18 (SUTURE) IMPLANT
SUT PROLENE 3 0 SH DA (SUTURE) ×5 IMPLANT
SUT PROLENE 4 0 RB 1 (SUTURE) ×10
SUT PROLENE 4 0 SH DA (SUTURE) IMPLANT
SUT PROLENE 4-0 RB1 .5 CRCL 36 (SUTURE) IMPLANT
SUT PROLENE 6 0 C 1 30 (SUTURE) ×14 IMPLANT
SUT PROLENE 7 0 BV 1 (SUTURE) ×2 IMPLANT
SUT PROLENE 7 0 BV1 MDA (SUTURE) ×5 IMPLANT
SUT PROLENE 8 0 BV175 6 (SUTURE) ×2 IMPLANT
SUT STEEL 6MS V (SUTURE) ×5 IMPLANT
SUT STEEL STERNAL CCS#1 18IN (SUTURE) IMPLANT
SUT STEEL SZ 6 DBL 3X14 BALL (SUTURE) ×5 IMPLANT
SUT VIC AB 1 CTX 36 (SUTURE) ×10
SUT VIC AB 1 CTX36XBRD ANBCTR (SUTURE) ×6 IMPLANT
SUT VIC AB 2-0 CT1 27 (SUTURE) ×5
SUT VIC AB 2-0 CT1 TAPERPNT 27 (SUTURE) IMPLANT
SUT VIC AB 2-0 CTX 27 (SUTURE) IMPLANT
SUT VIC AB 3-0 SH 27 (SUTURE)
SUT VIC AB 3-0 SH 27X BRD (SUTURE) IMPLANT
SUT VIC AB 3-0 X1 27 (SUTURE) ×2 IMPLANT
SUT VICRYL 4-0 PS2 18IN ABS (SUTURE) IMPLANT
SYSTEM SAHARA CHEST DRAIN ATS (WOUND CARE) ×5 IMPLANT
TAPE CLOTH SURG 4X10 WHT LF (GAUZE/BANDAGES/DRESSINGS) ×4 IMPLANT
TAPE PAPER 2X10 WHT MICROPORE (GAUZE/BANDAGES/DRESSINGS) ×2 IMPLANT
TOWEL GREEN STERILE (TOWEL DISPOSABLE) ×5 IMPLANT
TOWEL GREEN STERILE FF (TOWEL DISPOSABLE) ×5 IMPLANT
TRAY FOLEY SLVR 16FR TEMP STAT (SET/KITS/TRAYS/PACK) ×5 IMPLANT
TUBING LAP HI FLOW INSUFFLATIO (TUBING) ×5 IMPLANT
UNDERPAD 30X36 HEAVY ABSORB (UNDERPADS AND DIAPERS) ×5 IMPLANT
WATER STERILE IRR 1000ML POUR (IV SOLUTION) ×10 IMPLANT

## 2021-07-22 NOTE — Anesthesia Procedure Notes (Signed)
Arterial Line Insertion Start/End10/18/2022 8:05 AM, 07/22/2021 8:15 AM Performed by: Catalina Gravel, MD, Clearnce Sorrel, CRNA, anesthesiologist  Patient location: OR. Preanesthetic checklist: patient identified, IV checked, site marked, risks and benefits discussed, surgical consent, monitors and equipment checked, pre-op evaluation, timeout performed and anesthesia consent Right, radial was placed Catheter size: 20 G Hand hygiene performed  and maximum sterile barriers used   Attempts: 3 Procedure performed without using ultrasound guided technique. Following insertion, dressing applied and Biopatch. Post procedure assessment: normal and unchanged  Additional procedure comments: Right radial arterial line placed due to left radial arterial line waveform dampening and unable to draw back blood.Marland Kitchen

## 2021-07-22 NOTE — Anesthesia Procedure Notes (Signed)
Central Venous Catheter Insertion Performed by: Catalina Gravel, MD, anesthesiologist Start/End10/18/2022 7:05 AM, 07/22/2021 7:15 AM Patient location: Pre-op. Preanesthetic checklist: patient identified, IV checked, site marked, risks and benefits discussed, surgical consent, monitors and equipment checked, pre-op evaluation and timeout performed Position: Trendelenburg Hand hygiene performed  and maximum sterile barriers used  Total catheter length 100. PA cath was placed.Swan type:thermodilution PA Cath depth:48 Procedure performed without using ultrasound guided technique. Attempts: 1 Patient tolerated the procedure well with no immediate complications.

## 2021-07-22 NOTE — Procedures (Signed)
Extubation Procedure Note  Patient Details:   Name: Ryan [name] DOB: [birth year] MRN: [id number]   Airway Documentation:    Vent end date: 07/22/21 Vent end time: 1740   Evaluation  O2 sats: stable throughout Complications: No apparent complications Patient did tolerate procedure well. Bilateral Breath Sounds: Clear, Diminished   Patient extubated per Rapid wean protocol & placed on 4L [place]. Patient passed weaning parameters NIF -30, VC 1.2L, positive cuff leak. Patient able to cough & speak post extubation. IS instructed  Kathie Dike 07/22/2021, 5:47 PM

## 2021-07-22 NOTE — Progress Notes (Signed)
Patient ID: Ryan [name], male   DOB: 01-08-1946, 75 y.o.   MRN: [id number]  Events from yesterday noted.  Currently in OR for CABG.  Will continue to follow.  Continue urethral catheter postoperatively.  Will continue to follow.

## 2021-07-22 NOTE — Discharge Summary (Signed)
[address]       [place] [address]             [phone]    Physician Discharge Summary  Patient ID: Ryan [name] MRN: [id number] DOB/AGE: 1946-04-13 75 y.o.  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   Osteoporosis 05/31/2016   BPH (benign prostatic hyperplasia) 01/20/2015   Erectile dysfunction 01/20/2015   Rectal bleeding 01/20/2015   Preventative health care 01/20/2015   Eczema 08/03/2014   Sleep apnea 03/01/2014   Anticoagulation adequate with anticoagulant therapy 03/01/2014   Pre-syncope 02/26/2014   Abdominal pain 02/26/2014   PAD (peripheral artery disease) ([place]) 02/26/2014   Constipation 03/06/2013   Easy bruising 03/06/2013   History of alcohol abuse 04/15/2012   CAD (coronary atherosclerotic disease) 05/08/2011   Chest pain 03/10/2011   LUMBAR RADICULOPATHY, 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   UNS ADVRS EFF UNS RX  MEDICINAL&BIOLOGICAL SBSTNC 11/11/2009   H/O tobacco use, presenting hazards to health 07/29/2009   COPD GOLD I 07/29/2009   Hyperlipidemia, mixed 06/20/2007   Macular degeneration (senile) of retina 06/20/2007   Essential hypertension 06/20/2007   GERD 06/16/2007   Discharge Diagnoses:  Patient Active Problem List   Diagnosis Date Noted   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   Urinary tract infection associated with indwelling urethral catheter ([place])    CAD (coronary artery disease) 07/21/2021   Hematuria 07/20/2021   BPH with urinary obstruction 07/17/2021   CVA (cerebral vascular accident) ([place]) 03/23/2021   Stroke-like symptoms    Urinary frequency 03/04/2021   Hx of colonic polyps 11/24/2020   Diarrhea 11/24/2020   Dyspnea 04/20/2020   Anxiety 04/20/2020   Dilated cardiomyopathy ([place]) 04/10/2020   Emphysema lung ([place]) 04/10/2020   Elevated troponin 04/09/2020   Hypomagnesemia 04/09/2020   Hypokalemia 04/09/2020   Acute urinary retention 04/09/2020   Statin intolerance 12/19/2019   Vitamin D deficiency 09/18/2019   CHF (congestive heart failure) (HCC) 03/22/2018   PAF (paroxysmal atrial fibrillation) ([place]) 03/22/2018   Edema 03/22/2018   H/O: rheumatic fever 12/27/2017   Hyperglycemia 06/21/2017   Kidney stone 06/21/2017   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   Osteoporosis 05/31/2016   BPH (benign prostatic hyperplasia) 01/20/2015   Erectile dysfunction 01/20/2015   Rectal bleeding 01/20/2015   Preventative health care 01/20/2015   Eczema 08/03/2014   Sleep apnea 03/01/2014   Anticoagulation adequate with anticoagulant therapy 03/01/2014   Pre-syncope 02/26/2014   Abdominal pain 02/26/2014   PAD (peripheral artery disease) (  [place]) 02/26/2014   Constipation 03/06/2013   Easy bruising 03/06/2013   History of alcohol abuse 04/15/2012   CAD (coronary atherosclerotic disease) 05/08/2011   Chest pain 03/10/2011   LUMBAR RADICULOPATHY, 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   ADJUSTMENT DISORDER WITH  DEPRESSED MOOD 06/20/2010   UNS ADVRS EFF UNS RX MEDICINAL&BIOLOGICAL SBSTNC 11/11/2009   H/O tobacco use, presenting hazards to health 07/29/2009   COPD GOLD I 07/29/2009   Hyperlipidemia, mixed 06/20/2007   Macular degeneration (senile) of retina 06/20/2007   Essential hypertension 06/20/2007   GERD 06/16/2007    Discharged Condition: stable  History of Present Illness:    Past at time of consultation This is a 75 year old male with a past medical history of CAD (known moderate left main disease since 2012, borderline significant pRCA, and significant mid to distal LAD stenosis), hypertension, CKD, COPD (chronic obstructive pulmonary disease) ([place]), chronic diastolic CHF, TIA , PAF (paroxysmal atrial fibrillation, on Dabigatran), lumbar radiculopathy (left), and BPH/obstruction who on 07/17/2021 underwent a cystoscopy/TURP by Dr. Diona Fanti. He was discharged on 10/14 but returned to [facility] ED on 07/19/2021 with complaints of urinary retention and hematuria. He had a foley placed and was discharged and instructed to follow up with urologist for voiding trial the following week. However, he did not feel well (weak and had complaints of a fever, bladder spasms and further hematuria) so he returned to [facility] ED on 07/20/2021. He was placed on Cefepime. Urology was consulted. Per note, foley was draining well without significant hematuria and his H/H was stable from prior visits. UA showed bacteria but no nitrites. Oxybutynin was recommended for bladder spasms. Patient then developed chest pain (and later diaphoresis) early on 07/21/2021. Cardiology was consulted. This was felt to be acute coronary syndrome.  Apparently, EKG showed acute worsening of T wave inversion and ST segment depression in the anterior and septal leads. He received aspirin, Nitroglycerin, and he was placed on Heparin drip. Patient was transferred to [facility] for further evaluation.  Cardiac catheterization showed ostial left  main disease 90% stenosed, ostial to proximal Circumflex with a 60% stenosis, proximal RCA with a 70% stenosis, and RPDA with a 70% stenosis. Dr. Roxan Hockey has been consulted regarding consideration for coronary artery disease.   On further questioning has been having shortness of breath and chest tightness when riding his bike for several months.  After evaluating the patient and studies it is Dr. Leonarda Salon opinion that the patient should undergo coronary artery bypass grafting as his  revascularization option.  Hospital course: The patient was medically stabilized stable to proceed with his surgery.  And on 07/22/2021 he was taken to the operating room at which time he underwent 40 CABG x4.  With slow he tolerated the procedure well and was taken to the surgical intensive care unit in stable condition  Postoperative hospital course:  The patient is doing very well.  He was extubated using standard post cardiac surgical protocols without difficulty.  He has remained in sinus rhythm and is continued on Rythmol as well as a beta-blocker.  Pradaxa plans are to be restarted prior to discharge.  His A-line and Swan removed on postop day 1 as he had good hemodynamics.  He was started on a course of routine cardiac rehab as well as pulmonary hygiene.  His renal function has remained stable but he will require diuresis for volume overload.  He does have an expected acute blood loss anemia which is being monitored clinically.  Chest tubes were  removed on postoperative day #1.  He remained hypertensive and NTG drip was continued on POD #2. His lopressor dose was increased as tolerated.  He developed AKI with mild elevation in his creatinine level.  His foley catheter was kept in place due to urinary retention. The patient maintained NSR.  His pacing wires were removed on POD #3.  The patient developed nausea and vomiting with associated abdominal pain and bloating.  KUB was obtained and showed evidence of  Ileus.  He was made NPO and bowel regimen was instituted.  He was restarted on his home Pradaxa prior to discharge.  He developed hiccups and was given Thorazine. He had a history of PAF. He was on lopressor and Rythmol (as taken prior to surgery). He was volume overloaded and diuresed accordingly.  He made excellent progress with physical therapy and by the time of discharge he was nearly independent with mobility.  We did arrange for home health PT and a four-wheel Rollator per recommendations.  Consults: urology  Significant Diagnostic Studies: angiography:     Ost LM lesion is 90% stenosed.   Ost Cx to Prox Cx lesion is 60% stenosed.   Prox RCA lesion is 95% stenosed.   Dist RCA lesion is 70% stenosed.   RPDA lesion is 70% stenosed.  Treatments: surgery:   Operative Report    DATE OF PROCEDURE: 07/22/2021   PREOPERATIVE DIAGNOSIS:  Left main and 3-vessel disease with unstable angina.   POSTOPERATIVE DIAGNOSIS:  Left main and 3-vessel disease with unstable angina.   PROCEDURES:  Median sternotomy, extracorporeal circulation, coronary artery bypass grafting x4 (left internal mammary artery to LAD, saphenous vein graft to obtuse marginal 1, sequential saphenous vein graft to acute marginal and posterior descending),  endoscopic vein harvest right leg.   SURGEON:  Revonda Standard. Roxan Hockey, MD   ASSISTANT:  Jadene Pierini.  Discharge Exam: Blood pressure 101/61, pulse 85, temperature 98.5 F (36.9 C), temperature source Oral, resp. rate 19, height 5\' 4"  (1.626 m), weight 77.1 kg, SpO2 98 %.  General appearance: alert, cooperative, and no distress Neurologic: intact Heart: irregularly irregular rhythm, HR low 100's/min Lungs: CTA bilat Abdomen:  soft, non-tender Wound: clean and dry, pacer wires are out.    Discharge Medications:  The patient has been discharged on:   1.Beta Blocker:  Yes [ x  ]                              No   [   ]                              If No,  reason:  2.Ace Inhibitor/ARB: Yes [   ]                                     No  [  x  ]                                     If No, reason: soft BP.  May be added as outpatient.  3.Statin:   Yes [   ]                  No  [  x ]  If No, reason:Allergy  4.S[place]a [place]:  Yes  [ x  ]                  No   [   ]                  If No, reason:  Patient had ACS upon admission: Yes.  Plavix will not be ordered since the patient is on chronic anticoagulation for his persistent atrial fibrillation.  Plavix/P2Y12 inhibitor: Yes [   ]                                      No  [ x  ]     Discharge Instructions     AMB Referral to [facility]   Complete by: As directed    Reason For Referral: Lipids      Allergies as of 08/01/2021       Reactions   Albumin (human) Anaphylaxis   Polymyxin B-trimethoprim Swelling   Eye drops made eyes swell   Pseudoephedrine Other (See Comments)   Stomach cramps   Codeine Hives, Itching, Rash   Guaiacol Other (See Comments)   Hallucinations   Statins Other (See Comments)   Muscle cramps   Gabapentin Other (See Comments)   Pt unsure of sensitivity   Meloxicam Other (See Comments)   Pt unsure of sensitivity   Peppermint Flavor Other (See Comments)   Severe cramping   Pseudoephedrine-guaifenesin Nausea And Vomiting   Stomach cramps   Rosuvastatin Calcium Other (See Comments)   Muscle aches   Tapentadol Other (See Comments)   Pt unsure of sensitivity   Ciprofloxacin Hives, Itching, Nausea Only, Rash   Moxifloxacin Nausea Only, Other (See Comments)   Headaches, stomach cramps   Rofecoxib Other (See Comments)   Stomach cramping        Medication List     STOP taking these medications    amoxicillin 500 MG capsule Commonly known as: AMOXIL   furosemide 20 MG tablet Commonly known as: LASIX   potassium chloride 10 MEQ tablet Commonly known as: KLOR-CON       TAKE these medications    aspirin 81 MG EC  tablet Take 1 tablet (81 mg total) by mouth daily. Swallow whole.   Baclofen 5 MG Tabs Take 5 mg by mouth 3 (three) times daily as needed (for hiccups).   brimonidine 0.2 % ophthalmic solution Commonly known as: ALPHAGAN Place 1 drop into the left eye in the morning and at bedtime.   budesonide 3 MG 24 hr capsule Commonly known as: ENTOCORT EC Take 2 capsules (6 mg total) by mouth daily for 42 days, THEN 1 capsule (3 mg total) daily. Start taking on: June 19, 2021 What changed: See the new instructions.   clobetasol 0.05 % external solution Commonly known as: TEMOVATE APPLY 1 APPLICATION TOPICALLY TWICE DAILY   dabigatran 150 MG Caps capsule Commonly known as: Pradaxa Take 1 capsule (150 mg total) by mouth 2 (two) times daily.   latanoprost 0.005 % ophthalmic solution Commonly known as: XALATAN Place 1 drop into the left eye at bedtime.   levalbuterol 45 MCG/ACT inhaler Commonly known as: XOPENEX HFA Inhale 2 puffs into the lungs every 4 (four) hours as needed for wheezing.   LUCENTIS IO Inject 1 Dose into the eye once as needed (Macular degeneration).   magnesium oxide 400 (240 Mg) MG  tablet Commonly known as: MAGnesium-Oxide TAKE 1 TABLET(400 MG) BY MOUTH THREE TIMES DAILY AS NEEDED What changed:  how much to take how to take this when to take this additional instructions   metoprolol tartrate 25 MG tablet Commonly known as: LOPRESSOR TAKE 1 TABLET BY MOUTH  TWICE DAILY   nitroGLYCERIN 0.4 MG SL tablet Commonly known as: NITROSTAT Place 1 tablet (0.4 mg total) under the tongue every 5 (five) minutes as needed for chest pain.   omeprazole 20 MG tablet Commonly known as: PRILOSEC OTC Take 20 mg by mouth daily.   oxybutynin 5 MG tablet Commonly known as: DITROPAN Take 1 tablet (5 mg total) by mouth 3 (three) times daily as needed for up to 7 days for bladder spasms.   oxyCODONE 5 MG immediate release tablet Commonly known as: Oxy IR/ROXICODONE Take 1  tablet (5 mg total) by mouth every 4 (four) hours as needed (pain).   propafenone 225 MG tablet Commonly known as: RYTHMOL TAKE 1 TABLET BY MOUTH  TWICE DAILY   tamsulosin 0.4 MG Caps capsule Commonly known as: FLOMAX Take 0.4 mg by mouth 2 (two) times daily.               Durable Medical Equipment  (From admission, onward)           Start     Ordered   08/01/21 1342  For home use only DME 4 wheeled rolling walker with seat  Once       Question:  Patient needs a walker to treat with the following condition  Answer:  Imbalance   07/31/21 1345            Follow-up Information     Melrose Nakayama, MD. Go on 08/26/2021.   Specialty: Cardiothoracic Surgery Why: 10Your appointment is at 10 AM.  Please arrive 30 minutes early for chest x-ray to be performed by [facility] located on the first floor of the same building Contact information: [address] [place] [address] [phone]         Bertram Savin, DO .   Specialty: Psychiatry Contact information: [place] [address] [place] [address] Follow up.   Specialty: Cardiology Why: Please see discharge paperwork for follow-up appointment with cardiology. Contact information: [address], [address] [place]        Imogene Burn, [place]. Go on 08/12/2021.   Specialty: Cardiology Why: Your appointment is at 10:45 AM. Contact information: [address] [place] [address] [phone]                 Antony Odea, PA-C  08/01/2021, 9:11 AM

## 2021-07-22 NOTE — Anesthesia Procedure Notes (Signed)
Central Venous Catheter Insertion Performed by: Catalina Gravel, MD, anesthesiologist Start/End10/18/2022 6:55 AM, 07/22/2021 7:05 AM Patient location: Pre-op. Preanesthetic checklist: patient identified, IV checked, site marked, risks and benefits discussed, surgical consent, monitors and equipment checked, pre-op evaluation, timeout performed and anesthesia consent Position: Trendelenburg Lidocaine 1% used for infiltration and patient sedated Hand hygiene performed , maximum sterile barriers used  and Seldinger technique used Catheter size: 9 Fr Central line was placed.MAC introducer Procedure performed using ultrasound guided technique. Ultrasound Notes:anatomy identified, needle tip was noted to be adjacent to the nerve/plexus identified, no ultrasound evidence of intravascular and/or intraneural injection and image(s) printed for medical record Attempts: 1 Following insertion, line sutured, dressing applied and Biopatch. Post procedure assessment: free fluid flow, blood return through all ports and no air  Patient tolerated the procedure well with no immediate complications.

## 2021-07-22 NOTE — Interval H&P Note (Signed)
History and Physical Interval Note:  07/22/2021 7:10 AM  Ryan [name]  has presented today for surgery, with the diagnosis of CAD LMD.  The various methods of treatment have been discussed with the patient and family. After consideration of risks, benefits and other options for treatment, the patient has consented to  Procedure(s): CORONARY ARTERY BYPASS GRAFTING (CABG) (N/A) TRANSESOPHAGEAL ECHOCARDIOGRAM (TEE) (N/A) as a surgical intervention.  The patient's history has been reviewed, patient examined, no change in status, stable for surgery.  I have reviewed the patient's chart and labs.  Questions were answered to the patient's satisfaction.     Melrose Nakayama

## 2021-07-22 NOTE — Anesthesia Procedure Notes (Addendum)
Arterial Line Insertion Start/End10/18/2022 7:05 AM Performed by: Janace Litten, CRNA  Patient location: Pre-op. Preanesthetic checklist: patient identified, IV checked, risks and benefits discussed, surgical consent, monitors and equipment checked and pre-op evaluation Lidocaine 1% used for infiltration and patient sedated Left, radial was placed Catheter size: 20 G Hand hygiene performed  and maximum sterile barriers used   Attempts: 2 Procedure performed without using ultrasound guided technique. Following insertion, dressing applied and Biopatch. Post procedure assessment: unchanged and normal  Patient tolerated the procedure well with no immediate complications.

## 2021-07-22 NOTE — Anesthesia Postprocedure Evaluation (Signed)
Anesthesia Post Note  Patient: Ryan [name]  Procedure(s) Performed: 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, ON PUMP, USING LEFT INTERNAL MAMMARY ARTERY AND ENDOSCOPICALLY HARVESTED RIGHT GREATER SAPHENOUS VEIN (Chest) TRANSESOPHAGEAL ECHOCARDIOGRAM (TEE) APPLICATION OF CELL SAVER ENDOVEIN HARVEST OF GREATER SAPHENOUS VEIN     Patient location during evaluation: SICU Anesthesia Type: General Level of consciousness: sedated Pain management: pain level controlled Vital Signs Assessment: post-procedure vital signs reviewed and stable Respiratory status: patient remains intubated per anesthesia plan Cardiovascular status: stable Postop Assessment: no apparent nausea or vomiting Anesthetic complications: no   No notable events documented.  Last Vitals:  Vitals:   07/22/21 1307 07/22/21 1515  BP:    Pulse: 80 80  Resp: 12 15  Temp:  (!) 36.2 C  SpO2: 99% 100%    Last Pain:  Vitals:   07/22/21 1300  TempSrc: Core  PainSc: 0-No pain                 Catalina Gravel

## 2021-07-22 NOTE — Progress Notes (Signed)
Patient ID: EDGEL DEGNAN, male   DOB: 1946-08-20, 75 y.o.   MRN: [id number]  TCTS Evening Rounds:   Hemodynamically stable  CI = 1.9  Extubated  Urine output good  CT output low  CBC    Component Value Date/Time   WBC 15.8 (H) 07/22/2021 1309   RBC 3.15 (L) 07/22/2021 1309   HGB 9.9 (L) 07/22/2021 1318   HGB 14.2 04/13/2018 0929   HCT 29.0 (L) 07/22/2021 1318   HCT 41.6 04/13/2018 0929   PLT 109 (L) 07/22/2021 1309   PLT 216 04/13/2018 0929   MCV 94.0 07/22/2021 1309   MCV 92 04/13/2018 0929   MCH 31.1 07/22/2021 1309   MCHC 33.1 07/22/2021 1309   RDW 13.1 07/22/2021 1309   RDW 13.4 04/13/2018 0929   LYMPHSABS 2.3 07/20/2021 1502   MONOABS 0.8 07/20/2021 1502   EOSABS 0.2 07/20/2021 1502   BASOSABS 0.0 07/20/2021 1502     BMET    Component Value Date/Time   NA 137 07/22/2021 1318   NA 138 04/13/2018 0929   K 4.5 07/22/2021 1318   CL 103 07/22/2021 1205   CO2 18 (L) 07/22/2021 0221   GLUCOSE 122 (H) 07/22/2021 1205   BUN 9 07/22/2021 1205   BUN 26 04/13/2018 0929   CREATININE 0.90 07/22/2021 1205   CREATININE 1.42 (H) 05/16/2021 1528   CALCIUM 8.8 (L) 07/22/2021 0221   GFRNONAA >60 07/22/2021 0221     A/P:  Stable postop course. Continue current plans

## 2021-07-22 NOTE — Anesthesia Procedure Notes (Signed)
Procedure Name: Intubation Date/Time: 07/22/2021 7:58 AM Performed by: Janace Litten, CRNA Pre-anesthesia Checklist: Patient identified, Emergency Drugs available, Suction available and Patient being monitored Patient Re-evaluated:Patient Re-evaluated prior to induction Oxygen Delivery Method: Circle System Utilized Preoxygenation: Pre-oxygenation with 100% oxygen Induction Type: IV induction Ventilation: Mask ventilation without difficulty Laryngoscope Size: Mac and 4 Grade View: Grade I Tube type: Oral Tube size: 8.0 mm Number of attempts: 1 Airway Equipment and Method: Stylet and Oral airway Placement Confirmation: ETT inserted through vocal cords under direct vision, positive ETCO2 and breath sounds checked- equal and bilateral Secured at: 23 cm Tube secured with: Tape Dental Injury: Teeth and Oropharynx as per pre-operative assessment

## 2021-07-22 NOTE — Hospital Course (Addendum)
History of Present Illness:    Past at time of consultation This is a 75 year old male with a past medical history of CAD (known moderate left main disease since 2012, borderline significant pRCA, and significant mid to distal LAD stenosis), hypertension, CKD, COPD (chronic obstructive pulmonary disease) (HCC), chronic diastolic CHF, TIA , PAF (paroxysmal atrial fibrillation, on Dabigatran), lumbar radiculopathy (left), and BPH/obstruction who on 07/17/2021 underwent a cystoscopy/TURP by Dr. Diona Fanti. He was discharged on 10/14 but returned to the [facility] ED on 07/19/2021 with complaints of urinary retention and hematuria. He had a foley placed and was discharged and instructed to follow up with urologist for voiding trial the following week. However, he did not feel well (weak and had complaints of a fever, bladder spasms and further hematuria) so he returned to [facility] ED on 07/20/2021. He was placed on Cefepime. Urology was consulted. Per note, foley was draining well without significant hematuria and his H/H was stable from prior visits. UA showed bacteria but no nitrites. Oxybutynin was recommended for bladder spasms. Patient then developed chest pain (and later diaphoresis) early on 07/21/2021. Cardiology was consulted. This was felt to be acute coronary syndrome.  Apparently, EKG showed acute worsening of T wave inversion and ST segment depression in the anterior and septal leads. He received aspirin, Nitroglycerin, and he was placed on Heparin drip. Patient was transferred to [facility] for further evaluation.  Cardiac catheterization showed ostial left main disease 90% stenosed, ostial to proximal Circumflex with a 60% stenosis, proximal RCA with a 70% stenosis, and RPDA with a 70% stenosis. Dr. Roxan Hockey has been consulted regarding consideration for coronary artery disease.   On further questioning has been having shortness of breath and chest tightness when riding his bike for several  months.  After evaluating the patient and studies it is Dr. Leonarda Salon opinion that the patient should undergo coronary artery bypass grafting as his  revascularization option.  Hospital course: The patient was medically stabilized stable to proceed with his surgery.  And on 07/22/2021 he was taken to the operating room at which time he underwent 40 CABG x4.  With slow he tolerated the procedure well and was taken to the surgical intensive care unit in stable condition  Postoperative hospital course:  The patient is doing very well.  He was extubated using standard post cardiac surgical protocols without difficulty.  He has remained in sinus rhythm and is continued on Rythmol as well as a beta-blocker.  Pradaxa plans are to be restarted prior to discharge.  His A-line and Swan removed on postop day 1 as he had good hemodynamics.  He was started on a course of routine cardiac rehab as well as pulmonary hygiene.  His renal function has remained stable but he will require diuresis for volume overload.  He does have an expected acute blood loss anemia which is being monitored clinically.  Chest tubes were removed on postoperative day #1.  He remained hypertensive and NTG drip was continued on POD #2. His lopressor dose was increased as tolerated.  He developed AKI with mild elevation in his creatinine level.  His foley catheter was kept in place due to urinary retention. The patient maintained NSR.  His pacing wires were removed on POD #3.  The patient developed nausea and vomiting with associated abdominal pain and bloating.  KUB was obtained and showed evidence of Ileus.  He was made NPO and bowel regimen was instituted.  He was restarted on his home Pradaxa  prior to discharge.  He developed hiccups and was given Thorazine. He had a history of PAF. He was on lopressor and Rythmol (as taken prior to surgery). He was volume overloaded and diuresed accordingly.  His Ileus slowly improved and his diet was  advanced as tolerated.   He was volume overloaded and treated with Demadex.  He was felt medically stable for transfer to the progressive care unit on 07/29/2021. He remained in atrial fib with controlled rates.  He progressed with mobility and was independent with ambulation and transfers at the time of discharge. PT evaluated Mr. Bouillon prior to discharge and recommended *** at discharge.

## 2021-07-22 NOTE — Brief Op Note (Addendum)
07/20/2021 - 07/22/2021  1:51 PM  PATIENT:  Ryan [name]  75 y.o. male  PRE-OPERATIVE DIAGNOSIS:  Left main/ 3 vessel CAD  POST-OPERATIVE DIAGNOSIS:  Left main/ 3 vessel CAD  PROCEDURE:  Procedure(s): 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, ON PUMP, USING LEFT INTERNAL MAMMARY ARTERY AND ENDOSCOPICALLY HARVESTED RIGHT GREATER SAPHENOUS VEIN (N/A) TRANSESOPHAGEAL ECHOCARDIOGRAM (TEE) (N/A) APPLICATION OF CELL SAVER ENDOVEIN HARVEST OF GREATER SAPHENOUS VEIN LIMA to LAD SVG to OM1 SEQ SVG to AM and PDA   SURGEON:  Surgeon(s) and Role:    * Melrose Nakayama, MD - Primary  PHYSICIAN ASSISTANT: WAYNE GOLD PA-C  ASSISTANTS: STAFF   ANESTHESIA:   general  EBL:  814 mL   BLOOD ADMINISTERED: 1 UNIT CC PRBC  DRAINS:  LEFT PLEURAL AND MEDIASTINAL    LOCAL MEDICATIONS USED:  NONE  SPECIMEN:  No Specimen  DISPOSITION OF SPECIMEN:  N/A  COUNTS:  YES  TOURNIQUET:  * No tourniquets in log *  DICTATION: .Other Dictation: Dictation Number PENDING  PLAN OF CARE: Admit to inpatient   PATIENT DISPOSITION:  ICU - intubated and hemodynamically stable.   Delay start of Pharmacological VTE agent (>24hrs) due to surgical blood loss or risk of bleeding: yes  COMPLICATIONS: NO KNOWN

## 2021-07-22 NOTE — Transfer of Care (Signed)
Immediate Anesthesia Transfer of Care Note  Patient: Ryan [name]  Procedure(s) Performed: 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, ON PUMP, USING LEFT INTERNAL MAMMARY ARTERY AND ENDOSCOPICALLY HARVESTED RIGHT GREATER SAPHENOUS VEIN (Chest) TRANSESOPHAGEAL ECHOCARDIOGRAM (TEE) APPLICATION OF CELL SAVER ENDOVEIN HARVEST OF GREATER SAPHENOUS VEIN  Patient Location: PACU and ICU  Anesthesia Type:General  Level of Consciousness: Patient remains intubated per anesthesia plan  Airway & Oxygen Therapy: Patient remains intubated per anesthesia plan and Patient placed on Ventilator (see vital sign flow sheet for setting)  Post-op Assessment: Report given to RN and Post -op Vital signs reviewed and stable  Post vital signs: Reviewed and stable  Last Vitals:  Vitals Value Taken Time  BP    Temp 35.8 C 07/22/21 1310  Pulse 80 07/22/21 1310  Resp 14 07/22/21 1310  SpO2 99 % 07/22/21 1310  Vitals shown include unvalidated device data.  Last Pain:  Vitals:   07/22/21 0400  TempSrc:   PainSc: 0-No pain      Patients Stated Pain Goal: 1 (00/34/96 1164)  Complications: No notable events documented.

## 2021-07-22 NOTE — Op Note (Addendum)
NAMEESLEY, BROOKING MEDICAL RECORD NO: [id number] ACCOUNT NO: [id number] DATE OF BIRTH: 08/13/1946 FACILITY: [facility] LOCATION: [facility] PHYSICIAN: Revonda Standard. Roxan Hockey, MD  Operative Report   DATE OF PROCEDURE: 07/22/2021  PREOPERATIVE DIAGNOSIS:  Left main and 3-vessel disease with unstable angina.  POSTOPERATIVE DIAGNOSIS:  Left main and 3-vessel disease with unstable angina.  PROCEDURES:   Median sternotomy, extracorporeal circulation, Coronary artery bypass grafting x 4  Left internal mammary artery to LAD,  Saphenous vein graft to obtuse marginal 1, Sequential saphenous vein graft to acute marginal and posterior descending,  Endoscopic vein harvest right leg.  SURGEON:  Revonda Standard. Roxan Hockey, MD  ASSISTANT:  Jadene Pierini, PA-C.  ANESTHESIA:  General.  FINDINGS:  Transesophageal echocardiography revealed ejection fraction of approximately 50% with mild MR, mild inferior hypokinesis.  Improvement in inferior wall motion post-bypass, no change in MR.  Good quality conduits, good quality targets.  CLINICAL NOTE:  Mr. Mark is a 75 year old gentleman with known coronary artery disease as well as hypertension, chronic kidney disease, COPD, chronic diastolic congestive heart failure, paroxysmal atrial fibrillation, and BPH.  He had recently  undergone a urologic procedure.  While in the hospital, he developed unstable chest pain.  He was transferred to [facility] and had cardiac catheterization, which showed left main and 3-vessel coronary artery disease.  He was advised to undergo coronary  artery bypass grafting.  The indications, risks, benefits, and alternatives were discussed in detail with the patient.  He understood and accepted the risks and agreed to proceed.  OPERATIVE NOTE:  Mr. Hirschman was brought to the preoperative holding area on 07/22/2021.  Anesthesia placed a Swan-Ganz catheter and an arterial blood pressure monitoring line.  He was taken to the operating room,  anesthetized and intubated.  A Foley  catheter was already in place.  Intravenous antibiotics were administered.  Dr. Hoy Morn performed transesophageal echocardiography.  Please refer to his separately dictated note for full details of the procedure.  The chest, abdomen, and legs were  prepped and draped in the usual sterile fashion.  A timeout was performed.  A median sternotomy was performed, and the left internal mammary artery was harvested using standard technique.  Simultaneously, incision was made in the medial aspect of the right leg at the level of the knee.  The greater  saphenous vein was harvested from the upper calf to the groin endoscopically.  The saphenous vein was of excellent quality.  The mammary artery was relatively small, but had excellent flow when divided distally. 2000 units of heparin was administered  during the vessel harvest.  The remainder of full heparin dose was given prior to opening the pericardium.  The pericardium was opened.  The ascending aorta was inspected.  There was a calcific plaque in the ascending aorta.  The cannulation site was moved more distally onto the proximal arch in an area that was free of plaque.  There was sufficient room for  placement of the crossclamp and proximals.  After confirming adequate anticoagulation, the aorta was cannulated via concentric 2-0 Ethibond pledgeted pursestring sutures.  A dual-stage venous cannula was placed via a pursestring suture in the right  atrial appendage.  Cardiopulmonary bypass was initiated.  Flows were maintained per protocol.  There were extensive pericardial adhesions, and those were taken down with sharp dissection.  The coronary arteries were inspected, and anastomotic sites were  chosen.  The conduits were inspected and cut to length.  A foam pad was placed in the pericardium to  insulate the heart.  A temperature probe was placed in the myocardial septum, and a cardioplegia cannula was placed in the  ascending aorta.  The aorta was cross clamped.  The left ventricle was emptied via the aortic root vent.  Cardiac arrest was achieved with a combination of cold antegrade blood cardioplegia and topical iced saline. 1 liter of cardioplegia was administered.  There was a  rapid diastolic arrest and septal cooling to 10 degrees Celsius.  A reversed saphenous vein graft was placed to the acute marginal and posterior descending branches of the right coronary.  The acute marginal had a tight stenosis proximally.  The right coronary had significant disease throughout.  The posterior  descending had some mild plaque proximally.  Both targets were of good quality.  The vein was of good quality.  A side-to-side anastomosis was performed to the acute marginal and an end-to-side to the posterior descending, both were done with running 7-0  Prolene sutures.  A probe passed easily proximally and distally at the completion of each anastomosis before tying the suture.  Cardioplegia was administered down the graft, and there was good flow and good hemostasis.  After giving additional cardioplegia down the aortic root, the heart was elevated exposing the anterolateral wall.  The first obtuse marginal was a high anterolateral branch that was intramyocardial and was dissected out.  It was a 2 mm target vessel.   The vein was anastomosed end-to-side with a running 7-0 Prolene suture.  Again, a probe passed easily proximally and distally, cardioplegia was administered, and there was good flow and good hemostasis.  Additional cardioplegia was again given down the aortic root.  The left internal mammary artery was brought through a window in the pericardium.  The distal end was bevelled.  It was anastomosed end-to-side to the LAD.  The LAD gave rise to a large  septal perforator and then continued on giving off multiple small diagonal branches.  The LAD was a 1.5 mm good quality target at the site of anastomosis.  The mammary  was a 1.5 mm good quality conduit.  The end-to-side anastomosis was performed with a  running 7-0 Prolene suture.  At the completion of the anastomosis, the bulldog clamp was removed.  Rapid septal rewarming was noted.  The bulldog clamp was replaced, and the mammary pedicle was tacked to the epicardial surface of the heart with 6-0  Prolene sutures.  Additional cardioplegia was administered.  The vein grafts were cut to length.  The proximal anastomoses were performed to 4.5 mm punch aortotomies with running 6-0 Prolene sutures.  At the completion of the final proximal anastomosis, the patient was  placed in Trendelenburg position.  Lidocaine was administered.  The aortic root was de-aired, and the aortic crossclamp was removed.  The total crossclamp time was 78 minutes.  The patient spontaneously resumed a bradycardic rhythm.  He did not require  defibrillation. While rewarming was completed, all proximal and distal anastomoses were inspected for hemostasis.  Epicardial pacing wires were placed on the right ventricle and right atrium.  The patient was atrially paced for rate.  He weaned from  cardiopulmonary bypass on the first attempt.  Once the core temperature reached 37 degrees Celsius, he was on no inotropic support.  The total bypass time was 116 minutes.  The initial cardiac index was greater than 2 liters per minute per meter squared,  and the patient remained hemodynamically stable throughout the post-bypass period.  Post-bypass transesophageal echocardiography was unchanged with  the exception of some improvement in inferior wall motion.  A test dose of protamine was administered and was well tolerated.  The atrial and aortic cannulae were removed.  The remainder of  the protamine was administered without incident.  The chest was irrigated with warm saline.  Hemostasis was achieved.  Left pleural and mediastinal chest tubes were placed through separate subcostal incisions.  The pericardium was  reapproximated over the  aorta and heart with interrupted 3-0 silk sutures.  It came together easily without tension.  The sternum was closed with a combination of single and double heavy gauge stainless steel wires.  The pectoralis fascia, subcutaneous tissue, and skin were  closed in standard fashion.  All sponge, needle, and instrument counts were correct at the end of the procedure. The patient was taken from the operating room to the surgical intensive care unit, intubated, and in good condition.   ROH D: 07/22/2021 6:47:59 pm T: 07/22/2021 11:59:00 pm  JOB: [id number]/ [id number]

## 2021-07-23 ENCOUNTER — Encounter (HOSPITAL_COMMUNITY): Payer: Self-pay | Admitting: Thoracic Surgery (Cardiothoracic Vascular Surgery)

## 2021-07-23 ENCOUNTER — Inpatient Hospital Stay (HOSPITAL_COMMUNITY): Payer: Medicare Other

## 2021-07-23 DIAGNOSIS — Z951 Presence of aortocoronary bypass graft: Secondary | ICD-10-CM

## 2021-07-23 LAB — BASIC METABOLIC PANEL
Anion gap: 8 (ref 5–15)
Anion gap: 8 (ref 5–15)
BUN: 11 mg/dL (ref 8–23)
BUN: 18 mg/dL (ref 8–23)
CO2: 20 mmol/L — ABNORMAL LOW (ref 22–32)
CO2: 22 mmol/L (ref 22–32)
Calcium: 7.6 mg/dL — ABNORMAL LOW (ref 8.9–10.3)
Calcium: 8.1 mg/dL — ABNORMAL LOW (ref 8.9–10.3)
Chloride: 102 mmol/L (ref 98–111)
Chloride: 102 mmol/L (ref 98–111)
Creatinine, Ser: 1.24 mg/dL (ref 0.61–1.24)
Creatinine, Ser: 1.62 mg/dL — ABNORMAL HIGH (ref 0.61–1.24)
GFR, Estimated: >60 mL/min (ref >60)
GFR, Estimated: 44 mL/min — ABNORMAL LOW (ref >60)
Glucose, Bld: 120 mg/dL — ABNORMAL HIGH (ref 70–99)
Glucose, Bld: 148 mg/dL — ABNORMAL HIGH (ref 70–99)
Potassium: 4.3 mmol/L (ref 3.5–5.1)
Potassium: 4 mmol/L (ref 3.5–5.1)
Sodium: 130 mmol/L — ABNORMAL LOW (ref 135–145)
Sodium: 132 mmol/L — ABNORMAL LOW (ref 135–145)

## 2021-07-23 LAB — POCT I-STAT 7, (LYTES, BLD GAS, ICA,H+H)
Acid-base deficit: 5 mmol/L — ABNORMAL HIGH (ref 0.0–2.0)
Acid-base deficit: 4 mmol/L — ABNORMAL HIGH (ref 0.0–2.0)
Bicarbonate: 19.9 mmol/L — ABNORMAL LOW (ref 20.0–28.0)
Bicarbonate: 20.8 mmol/L (ref 20.0–28.0)
Calcium, Ion: 1.17 mmol/L (ref 1.15–1.40)
Calcium, Ion: 1.2 mmol/L (ref 1.15–1.40)
HCT: 31 % — ABNORMAL LOW (ref 39.0–52.0)
HCT: 31 % — ABNORMAL LOW (ref 39.0–52.0)
Hemoglobin: 10.5 g/dL — ABNORMAL LOW (ref 13.0–17.0)
Hemoglobin: 10.5 g/dL — ABNORMAL LOW (ref 13.0–17.0)
O2 Saturation: 98 %
O2 Saturation: 96 %
Patient temperature: 36.8
Patient temperature: 37.1
Potassium: 4.5 mmol/L (ref 3.5–5.1)
Potassium: 4.5 mmol/L (ref 3.5–5.1)
Sodium: 138 mmol/L (ref 135–145)
Sodium: 137 mmol/L (ref 135–145)
TCO2: 21 mmol/L — ABNORMAL LOW (ref 22–32)
TCO2: 22 mmol/L (ref 22–32)
pCO2 arterial: 34.7 mmHg (ref 32.0–48.0)
pCO2 arterial: 37 mmHg (ref 32.0–48.0)
pH, Arterial: 7.366 (ref 7.350–7.450)
pH, Arterial: 7.358 (ref 7.350–7.450)
pO2, Arterial: 102 mmHg (ref 83.0–108.0)
pO2, Arterial: 86 mmHg (ref 83.0–108.0)

## 2021-07-23 LAB — GLUCOSE, CAPILLARY
Glucose-Capillary: 114 mg/dL — ABNORMAL HIGH (ref 70–99)
Glucose-Capillary: 116 mg/dL — ABNORMAL HIGH (ref 70–99)
Glucose-Capillary: 124 mg/dL — ABNORMAL HIGH (ref 70–99)
Glucose-Capillary: 129 mg/dL — ABNORMAL HIGH (ref 70–99)
Glucose-Capillary: 136 mg/dL — ABNORMAL HIGH (ref 70–99)
Glucose-Capillary: 140 mg/dL — ABNORMAL HIGH (ref 70–99)
Glucose-Capillary: 149 mg/dL — ABNORMAL HIGH (ref 70–99)

## 2021-07-23 LAB — CBC
HCT: 29.6 % — ABNORMAL LOW (ref 39.0–52.0)
HCT: 28.5 % — ABNORMAL LOW (ref 39.0–52.0)
Hemoglobin: 9.8 g/dL — ABNORMAL LOW (ref 13.0–17.0)
Hemoglobin: 9.7 g/dL — ABNORMAL LOW (ref 13.0–17.0)
MCH: 30.8 pg (ref 26.0–34.0)
MCH: 31.6 pg (ref 26.0–34.0)
MCHC: 33.1 g/dL (ref 30.0–36.0)
MCHC: 34 g/dL (ref 30.0–36.0)
MCV: 93.1 fL (ref 80.0–100.0)
MCV: 92.8 fL (ref 80.0–100.0)
Platelets: 125 10*3/uL — ABNORMAL LOW (ref 150–400)
Platelets: 141 10*3/uL — ABNORMAL LOW (ref 150–400)
RBC: 3.18 MIL/uL — ABNORMAL LOW (ref 4.22–5.81)
RBC: 3.07 MIL/uL — ABNORMAL LOW (ref 4.22–5.81)
RDW: 13.2 % (ref 11.5–15.5)
RDW: 13.3 % (ref 11.5–15.5)
WBC: 16.1 10*3/uL — ABNORMAL HIGH (ref 4.0–10.5)
WBC: 17.4 10*3/uL — ABNORMAL HIGH (ref 4.0–10.5)
nRBC: 0 % (ref 0.0–0.2)
nRBC: 0 % (ref 0.0–0.2)

## 2021-07-23 LAB — MAGNESIUM
Magnesium: 2.3 mg/dL (ref 1.7–2.4)
Magnesium: 2.2 mg/dL (ref 1.7–2.4)

## 2021-07-23 MED ORDER — LATANOPROST 0.005 % OP SOLN
1.0000 [drp] | Freq: Every day | OPHTHALMIC | Status: DC
  Administered 2021-07-23 – 2021-07-31 (×9): 1 [drp] via OPHTHALMIC

## 2021-07-23 MED ORDER — ENOXAPARIN SODIUM 40 MG/0.4ML IJ SOSY
40.0000 mg | PREFILLED_SYRINGE | Freq: Every day | INTRAMUSCULAR | Status: DC
  Administered 2021-07-23 – 2021-07-24 (×2): 40 mg via SUBCUTANEOUS
  Filled 2021-07-23 (×2): qty 0.4

## 2021-07-23 MED ORDER — OXYCODONE HCL 5 MG PO TABS
5.0000 mg | ORAL_TABLET | ORAL | Status: DC | PRN
  Administered 2021-07-23: 5 mg via ORAL
  Administered 2021-07-23 – 2021-07-24 (×5): 10 mg via ORAL
  Administered 2021-07-26 – 2021-07-27 (×4): 5 mg via ORAL
  Administered 2021-07-27 – 2021-07-28 (×5): 10 mg via ORAL
  Administered 2021-07-29: 5 mg via ORAL
  Administered 2021-07-29 (×2): 10 mg via ORAL
  Filled 2021-07-23: qty 2
  Filled 2021-07-23: qty 1
  Filled 2021-07-23 (×2): qty 2
  Filled 2021-07-23: qty 1
  Filled 2021-07-23 (×3): qty 2
  Filled 2021-07-23: qty 1
  Filled 2021-07-23 (×6): qty 2
  Filled 2021-07-23 (×2): qty 1
  Filled 2021-07-23: qty 2

## 2021-07-23 MED ORDER — BRIMONIDINE TARTRATE 0.2 % OP SOLN
1.0000 [drp] | Freq: Two times a day (BID) | OPHTHALMIC | Status: DC
  Administered 2021-07-23 – 2021-08-01 (×17): 1 [drp] via OPHTHALMIC

## 2021-07-23 MED ORDER — SODIUM CHLORIDE 0.9 % IV SOLN
INTRAVENOUS | Status: DC | PRN
  Administered 2021-07-23: 250 mL via INTRAVENOUS

## 2021-07-23 MED ORDER — LEVALBUTEROL TARTRATE 45 MCG/ACT IN AERO
2.0000 | INHALATION_SPRAY | RESPIRATORY_TRACT | Status: DC | PRN
  Administered 2021-07-29 – 2021-08-01 (×3): 2 via RESPIRATORY_TRACT

## 2021-07-23 MED ORDER — FUROSEMIDE 10 MG/ML IJ SOLN
20.0000 mg | Freq: Two times a day (BID) | INTRAMUSCULAR | Status: AC
  Administered 2021-07-23 (×2): 20 mg via INTRAVENOUS
  Filled 2021-07-23 (×2): qty 2

## 2021-07-23 MED FILL — Lidocaine HCl Local Soln Prefilled Syringe 100 MG/5ML (2%): INTRAMUSCULAR | Qty: 5 | Status: AC

## 2021-07-23 MED FILL — Electrolyte-R (PH 7.4) Solution: INTRAVENOUS | Qty: 3000 | Status: AC

## 2021-07-23 MED FILL — Sodium Bicarbonate IV Soln 8.4%: INTRAVENOUS | Qty: 50 | Status: AC

## 2021-07-23 MED FILL — Mannitol IV Soln 20%: INTRAVENOUS | Qty: 500 | Status: AC

## 2021-07-23 MED FILL — Heparin Sodium (Porcine) Inj 1000 Unit/ML: INTRAMUSCULAR | Qty: 1 | Status: AC

## 2021-07-23 MED FILL — Sodium Chloride IV Soln 0.9%: INTRAVENOUS | Qty: 2000 | Status: AC

## 2021-07-23 NOTE — Progress Notes (Signed)
Pharmacist discussed with patient home medication policy - patient would still like to use home supply eye drops and inhaler.   Pharmacist verified medications with nursing and patient for dose, frequency, and expiration date.  Home brimonidine 0.2% eye drop, latanoprost 0.005% eye drop, and levalbuterol HFA inhaler at bedside.   Patient is aware nursing needs to be present before medications can be administered.   Antonietta Jewel, PharmD, [place] Clinical Pharmacist  Phone: [phone] 07/23/2021 12:45 PM  Please check AMION for all [place] phone numbers After 10:00 PM, call [place] [phone]

## 2021-07-23 NOTE — Progress Notes (Signed)
1 Day Post-Op Procedure(s) (LRB): 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, ON PUMP, USING LEFT INTERNAL MAMMARY ARTERY AND ENDOSCOPICALLY HARVESTED RIGHT GREATER SAPHENOUS VEIN (N/A) TRANSESOPHAGEAL ECHOCARDIOGRAM (TEE) (N/A) APPLICATION OF CELL SAVER ENDOVEIN HARVEST OF GREATER SAPHENOUS VEIN Subjective: No complaints this AM  Objective: Vital signs in last 24 hours: Temp:  [96.6 F (35.9 C)-99.7 F (37.6 C)] 99.1 F (37.3 C) (10/19 0700) Pulse Rate:  [57-90] 68 (10/19 0700) Cardiac Rhythm: Atrial paced (10/19 0400) Resp:  [12-30] 19 (10/19 0700) BP: (109-132)/(63-71) 124/63 (10/19 0700) SpO2:  [90 %-100 %] 92 % (10/19 0700) Arterial Line BP: (103-178)/(44-71) 153/55 (10/19 0700) FiO2 (%):  [40 %-50 %] 40 % (10/18 1705) Weight:  [82.6 kg] 82.6 kg (10/19 0615)  Hemodynamic parameters for last 24 hours: PAP: (28-59)/(10-28) 40/15 CVP:  [7 mmHg-15 mmHg] 8 mmHg PCWP:  [12 mmHg-18 mmHg] 16 mmHg CO:  [2.8 L/min-4.7 L/min] 4.2 L/min CI:  [1.5 L/min/m2-2.5 L/min/m2] 2.3 L/min/m2  Intake/Output from previous day: 10/18 0701 - 10/19 0700 In: 4644.2 [P.O.:480; I.V.:2936.1; Blood:380; IV Piggyback:848.1] Out: 5009 [Urine:2410; Emesis/NG output:400; Blood:814; Chest Tube:260] Intake/Output this shift: No intake/output data recorded.  General appearance: alert, cooperative, and no distress Neurologic: intact Heart: regular rate and rhythm Lungs: diminished breath sounds bibasilar  Lab Results: Recent Labs    07/22/21 1900 07/23/21 0404  WBC 20.2* 16.1*  HGB 10.9* 9.8*  HCT 32.7* 29.6*  PLT 141* 125*   BMET:  Recent Labs    07/22/21 1900 07/23/21 0404  NA 135 130*  K 4.4 4.3  CL 109 102  CO2 21* 20*  GLUCOSE 116* 120*  BUN 10 11  CREATININE 1.05 1.24  CALCIUM 7.7* 7.6*    PT/INR:  Recent Labs    07/22/21 1309  LABPROT 17.4*  INR 1.4*   ABG    Component Value Date/Time   PHART 7.358 07/22/2021 1840   HCO3 20.8 07/22/2021 1840   TCO2 22 07/22/2021  1840   ACIDBASEDEF 4.0 (H) 07/22/2021 1840   O2SAT 96.0 07/22/2021 1840   CBG (last 3)  Recent Labs    07/23/21 0019 07/23/21 0414 07/23/21 0742  GLUCAP 136* 116* 124*    Assessment/Plan: S/P Procedure(s) (LRB): 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, ON PUMP, USING LEFT INTERNAL MAMMARY ARTERY AND ENDOSCOPICALLY HARVESTED RIGHT GREATER SAPHENOUS VEIN (N/A) TRANSESOPHAGEAL ECHOCARDIOGRAM (TEE) (N/A) APPLICATION OF CELL SAVER ENDOVEIN HARVEST OF GREATER SAPHENOUS VEIN POD # 1. Looks great NEURO- intact CV- in SR with rate in 60s on Rhythmol and metoprolol  Restart Pradaxa prior to DC  Good hemodynamics- dc Swan and A line RESP- IS for basilar atelectasis RENAL- creatinine stable  Keep Foley in place  Diurese for volume overload ENDO- CBG well controlled, continue q4 SSi Anemia secondary to ABL- follow Dc chest tubes Cardiac rehab   LOS: 3 days    Ryan [name] 07/23/2021

## 2021-07-23 NOTE — Progress Notes (Signed)
EVENING ROUNDS NOTE :     [name]       ,[place] [address]             [phone]                 1 Day Post-Op Procedure(s) (LRB): 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, ON PUMP, USING LEFT INTERNAL MAMMARY ARTERY AND ENDOSCOPICALLY HARVESTED RIGHT GREATER SAPHENOUS VEIN (N/A) TRANSESOPHAGEAL ECHOCARDIOGRAM (TEE) (N/A) APPLICATION OF CELL SAVER ENDOVEIN HARVEST OF GREATER SAPHENOUS VEIN   Total Length of Stay:  LOS: 3 days  Events:   No events Visiting with family Doing well    BP (!) 110/55 (BP Location: Left Arm)   Pulse 69   Temp 98.1 F (36.7 C) (Oral)   Resp (!) 22   Ht 5\' 4"  (1.626 m)   Wt 82.6 kg   SpO2 95%   BMI 31.24 kg/m   PAP: (28-59)/(10-28) 44/17 CVP:  [7 mmHg-15 mmHg] 8 mmHg PCWP:  [12 mmHg-18 mmHg] 16 mmHg CO:  [3.4 L/min-4.7 L/min] 4.2 L/min CI:  [1.9 L/min/m2-2.5 L/min/m2] 2.3 L/min/m2  Vent Mode: PSV;CPAP FiO2 (%):  [40 %] 40 % Set Rate:  [4 bmp] 4 bmp Vt Set:  [480 mL] 480 mL PEEP:  [5 cmH20] 5 cmH20 Pressure Support:  [10 cmH20] 10 cmH20   sodium chloride Stopped (07/23/21 1016)   sodium chloride     sodium chloride 20 mL/hr at 07/22/21 1329   sodium chloride 10 mL/hr at 07/23/21 1600    ceFAZolin (ANCEF) IV Stopped (07/23/21 1347)   dexmedetomidine (PRECEDEX) IV infusion Stopped (07/23/21 0453)   insulin Stopped (07/22/21 2054)   lactated ringers     lactated ringers     lactated ringers Stopped (07/23/21 0622)   nitroGLYCERIN 30 mcg/min (07/23/21 1600)   phenylephrine (NEO-SYNEPHRINE) Adult infusion Stopped (07/22/21 1938)    I/O last 3 completed shifts: In: 5076.9 [P.O.:720; I.V.:3028.8; Blood:380; IV Piggyback:948.1] Out: 1779 [Urine:2810; Emesis/NG output:400; Blood:814; Chest Tube:260]   CBC Latest Ref Rng & Units 07/23/2021 07/22/2021 07/22/2021  WBC 4.0 - 10.5 K/uL 16.1(H) 20.2(H) -  Hemoglobin 13.0 - 17.0 g/dL 9.8(L) 10.9(L) 10.5(L)  Hematocrit 39.0 - 52.0 % 29.6(L) 32.7(L) 31.0(L)   Platelets 150 - 400 K/uL 125(L) 141(L) -    BMP Latest Ref Rng & Units 07/23/2021 07/22/2021 07/22/2021  Glucose 70 - 99 mg/dL 120(H) 116(H) -  BUN 8 - 23 mg/dL 11 10 -  Creatinine 0.61 - 1.24 mg/dL 1.24 1.05 -  BUN/Creat Ratio 6 - 22 (calc) - - -  Sodium 135 - 145 mmol/L 130(L) 135 137  Potassium 3.5 - 5.1 mmol/L 4.3 4.4 4.5  Chloride 98 - 111 mmol/L 102 109 -  CO2 22 - 32 mmol/L 20(L) 21(L) -  Calcium 8.9 - 10.3 mg/dL 7.6(L) 7.7(L) -    ABG    Component Value Date/Time   PHART 7.358 07/22/2021 1840   PCO2ART 37.0 07/22/2021 1840   PO2ART 86 07/22/2021 1840   HCO3 20.8 07/22/2021 1840   TCO2 22 07/22/2021 1840   ACIDBASEDEF 4.0 (H) 07/22/2021 1840   O2SAT 96.0 07/22/2021 1840       Ryan Bouillon, MD 07/23/2021 4:38 PM

## 2021-07-23 NOTE — Progress Notes (Signed)
1 Day Post-Op Subjective: Patient doing well--alert.No significant hematuria  Objective: Vital signs in last 24 hours: Temp:  [96.6 F (35.9 C)-99.7 F (37.6 C)] 99.1 F (37.3 C) (10/19 0700) Pulse Rate:  [57-90] 59 (10/19 0800) Resp:  [12-30] 14 (10/19 0800) BP: (107-132)/(57-71) 107/57 (10/19 0800) SpO2:  [90 %-100 %] 95 % (10/19 0800) Arterial Line BP: (103-178)/(44-71) 116/45 (10/19 0800) FiO2 (%):  [40 %-50 %] 40 % (10/18 1705) Weight:  [82.6 kg] 82.6 kg (10/19 0615)  Intake/Output from previous day: 10/18 0701 - 10/19 0700 In: 4644.2 [P.O.:480; I.V.:2936.1; Blood:380; IV Piggyback:848.1] Out: 0034 [Urine:2410; Emesis/NG output:400; Blood:814; Chest Tube:260] Intake/Output this shift: Total I/O In: 122 [I.V.:22; IV Piggyback:100] Out: -   Physical Exam:  Constitutional: Vital signs reviewed. WD WN in NAD   Eyes: PERRL, No scleral icterus.     Lab Results: Recent Labs    07/22/21 1840 07/22/21 1900 07/23/21 0404  HGB 10.5* 10.9* 9.8*  HCT 31.0* 32.7* 29.6*   BMET Recent Labs    07/22/21 1900 07/23/21 0404  NA 135 130*  K 4.4 4.3  CL 109 102  CO2 21* 20*  GLUCOSE 116* 120*  BUN 10 11  CREATININE 1.05 1.24  CALCIUM 7.7* 7.6*   Recent Labs    07/20/21 1103 07/22/21 1309  INR 1.1 1.4*   No results for input(s): LABURIN in the last 72 hours. Results for orders placed or performed during the hospital encounter of 07/20/21  Blood Culture (routine x 2)     Status: None (Preliminary result)   Collection Time: 07/20/21 10:30 AM   Specimen: BLOOD  Result Value Ref Range Status   Specimen Description   Final    BLOOD BLOOD RIGHT HAND Performed at [facility] [address], [place], [place] [address]    Special Requests   Final    BOTTLES DRAWN AEROBIC AND ANAEROBIC Blood Culture adequate volume Performed at [facility] [address], [place], [place] [address]    Culture   Final    NO GROWTH 3  DAYS Performed at [facility], [place] [address], [place], [place] [address]    Report Status PENDING  Incomplete  Blood Culture (routine x 2)     Status: None (Preliminary result)   Collection Time: 07/20/21 10:38 AM   Specimen: BLOOD  Result Value Ref Range Status   Specimen Description   Final    BLOOD LEFT ANTECUBITAL Performed at [facility] [address], [place], [place] [address]    Special Requests   Final    BOTTLES DRAWN AEROBIC AND ANAEROBIC Blood Culture results may not be optimal due to an excessive volume of blood received in culture bottles Performed at [facility] [address], [place], [place] [address]    Culture   Final    NO GROWTH 3 DAYS Performed at [facility], [place] [address], [place], [place] [address]    Report Status PENDING  Incomplete  Resp Panel by RT-PCR (Flu A&B, Covid) Nasopharyngeal Swab     Status: None   Collection Time: 07/20/21 11:03 AM   Specimen: Nasopharyngeal Swab; Nasopharyngeal(NP) swabs in vial transport medium  Result Value Ref Range Status   SARS Coronavirus 2 by RT PCR NEGATIVE NEGATIVE Final    Comment: (NOTE) SARS-CoV-2 target nucleic acids are NOT DETECTED.  The SARS-CoV-2 RNA is generally detectable in upper respiratory specimens during the acute phase of infection. The lowest concentration  of SARS-CoV-2 viral copies this assay can detect is 138 copies/mL. A negative result does not preclude SARS-Cov-2 infection and should not be used as the sole basis for treatment or other patient management decisions. A negative result may occur with  improper specimen collection/handling, submission of specimen other than nasopharyngeal swab, presence of viral mutation(s) within the areas targeted by this assay, and inadequate number of viral copies(<138 copies/mL). A negative result must be combined with clinical observations, patient history, and epidemiological information. The  expected result is Negative.  Fact Sheet for Patients:  EntrepreneurPulse.com.au  Fact Sheet for Healthcare Providers:  IncredibleEmployment.be  This test is no t yet approved or cleared by the [place] FDA and  has been authorized for detection and/or diagnosis of SARS-CoV-2 by FDA under an Emergency Use Authorization (EUA). This EUA will remain  in effect (meaning this test can be used) for the duration of the COVID-19 declaration under Section 564(b)(1) of the Act, 21 U.S.C.section 360bbb-3(b)(1), unless the authorization is terminated  or revoked sooner.       Influenza A by PCR NEGATIVE NEGATIVE Final   Influenza B by PCR NEGATIVE NEGATIVE Final    Comment: (NOTE) The Xpert Xpress SARS-CoV-2/FLU/RSV plus assay is intended as an aid in the diagnosis of influenza from Nasopharyngeal swab specimens and should not be used as a sole basis for treatment. Nasal washings and aspirates are unacceptable for Xpert Xpress SARS-CoV-2/FLU/RSV testing.  Fact Sheet for Patients: EntrepreneurPulse.com.au  Fact Sheet for Healthcare Providers: IncredibleEmployment.be  This test is not yet approved or cleared by the [place] FDA and has been authorized for detection and/or diagnosis of SARS-CoV-2 by FDA under an Emergency Use Authorization (EUA). This EUA will remain in effect (meaning this test can be used) for the duration of the COVID-19 declaration under Section 564(b)(1) of the Act, 21 U.S.C. section 360bbb-3(b)(1), unless the authorization is terminated or revoked.  Performed at [facility], [place] [address], [place], [place] [address]   Urine Culture     Status: None   Collection Time: 07/20/21 11:03 AM   Specimen: In/Out Cath Urine  Result Value Ref Range Status   Specimen Description   Final    IN/OUT CATH URINE Performed at [facility] [address], [place], [place] [address]    Special Requests   Final    NONE Performed at [facility], [place] [address], [place], [place] [address]    Culture   Final    NO GROWTH Performed at [facility], [place] [address] [place], [place] [address]    Report Status 07/21/2021 FINAL  Final  Surgical pcr screen     Status: None   Collection Time: 07/22/21  2:21 AM   Specimen: Nasal Mucosa; Nasal Swab  Result Value Ref Range Status   MRSA, PCR NEGATIVE NEGATIVE Final   Staphylococcus aureus NEGATIVE NEGATIVE Final    Comment: (NOTE) The Xpert SA Assay (FDA approved for NASAL specimens in patients 14 years of age and older), is one component of a comprehensive surveillance program. It is not intended to diagnose infection nor to guide or monitor treatment. Performed at [facility], [place] [address] [place], [place] [address]     Studies/Results: CARDIAC CATHETERIZATION  Result Date: 07/21/2021 Images from the original result were not included.   Ost LM lesion is 90% stenosed.   Ost Cx to Prox Cx lesion is 60% stenosed.   Prox RCA lesion  is 95% stenosed.   Dist RCA lesion is 70% stenosed.   RPDA lesion is 70% stenosed. Ryan [name] is a 75 y.o. male  [id number] LOCATION:  FACILITY: [facility] PHYSICIAN: Quay Burow, M.D. 1946-03-20 DATE OF PROCEDURE:  07/21/2021 DATE OF DISCHARGE: CARDIAC CATHETERIZATION History obtained from chart review.  75 year old married Caucasian male with prior history of nonobstructive CAD.  He had a TURP last week and has had some hematuria.  He developed chest pain was brought to the emergency room where he had dynamic ST segment depression.  He was seen by Dr. Acie Fredrickson who felt he needed urgent cardiac cath.   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  His LVEDP is 18.  His systolic blood pressures 258.  He is currently pain-free.  He will need CABG for complete revascularization.  The sheath was removed and a TR band was placed on the right  wrist to achieve patent hemostasis.  The patient left lab in stable condition.  I will reheparinize him 4 hours after sheath removal.  T CTS has been consulted. Quay Burow. MD, [facility] 07/21/2021 3:12 PM    DG Chest Port 1 View  Result Date: 07/23/2021 CLINICAL DATA:  Chest tube present s/p open heart surg,very sore chest EXAM: PORTABLE CHEST - 1 VIEW COMPARISON:  the previous day's study FINDINGS: Interval extubation. Gastric tube removed. Left chest tube stable, no pneumothorax. Right IJ Swan-Ganz stable to the proximal main PA. Mediastinal drain in place. Persistent left retrocardiac consolidation/atelectasis. Blunting of the left lateral costophrenic angle suggesting small effusion. Heart size upper limits normal. CABG markers and sternotomy wires. Cervical fixation hardware partially visualized. IMPRESSION: 1. Extubation with persistent left retrocardiac consolidation/atelectasis, possible small left effusion. Electronically Signed   By: Lucrezia Europe M.D.   On: 07/23/2021 06:16   DG Chest Port 1 View  Result Date: 07/22/2021 CLINICAL DATA:  Status post CABG. EXAM: PORTABLE CHEST 1 VIEW COMPARISON:  Chest x-ray from yesterday. FINDINGS: Endotracheal tube in position with the tip 4.2 cm above the level of the carina. Enteric tube entering the stomach with the tip below the field of view. Right internal jugular Swan-Ganz catheter with the tip in the main pulmonary outflow tract. Mediastinal and left chest tubes are in good position. Interval CABG. Unchanged mild cardiomegaly. Low lung volumes. Left basilar atelectasis. Small left pleural effusion no pneumothorax. No acute osseous abnormality. IMPRESSION: 1. Interval CABG. Lines and tubes in satisfactory position. No pneumothorax. 2. Small left pleural effusion with left basilar atelectasis. Electronically Signed   By: Titus Dubin M.D.   On: 07/22/2021 14:30   DG CHEST PORT 1 VIEW  Result Date: 07/21/2021 CLINICAL DATA:  Mid anterior chest pain  EXAM: PORTABLE CHEST 1 VIEW COMPARISON:  Chest radiograph 07/20/2021 FINDINGS: The cardiomediastinal silhouette is stable. The central pulmonary vasculature is prominent, unchanged. Patchy opacities in the right base are increased in conspicuity. Otherwise, there is no focal consolidation or pulmonary edema. There is no pleural effusion or pneumothorax. The bones are stable. IMPRESSION: Slightly increased patchy opacities in the right base may reflect atelectasis or infection. Electronically Signed   By: Valetta Mole M.D.   On: 07/21/2021 11:25   ECHO INTRAOPERATIVE TEE  Result Date: 07/22/2021  *INTRAOPERATIVE TRANSESOPHAGEAL REPORT *  Patient Name:   SKILER OLDEN Date of Exam: 07/22/2021 Medical Rec #:  [id number]      Height:       64.0 in Accession #:    [id number]     Weight:  175.5 lb Date of Birth:  1946/01/19      BSA:          1.85 m Patient Age:    75 years       BP:           121/54 mmHg Patient Gender: M              HR:           56 bpm. Exam Location:  Anesthesiology Transesophogeal exam was perform intraoperatively during surgical procedure. Patient was closely monitored under general anesthesia during the entirety of examination. Indications:     CAD Native Vessel i25.10 Sonographer:     Raquel Sarna Senior RDCS Performing Phys: [place] Diagnosing Phys: Hoy Morn MD Complications: No known complications during this procedure. POST-OP IMPRESSIONS _ Left Ventricle: has mildly reduced systolic function, with an ejection fraction of 50%. The cavity size was normal. The wall motion is normal. _ Right Ventricle: The right ventricle appears unchanged from pre-bypass. _ Aorta: The aorta appears unchanged from pre-bypass. _ Left Atrial Appendage: The left atrial appendage appears unchanged from pre-bypass. _ Aortic Valve: The aortic valve appears unchanged from pre-bypass. _ Mitral Valve: The mitral valve appears unchanged from pre-bypass. _ Tricuspid Valve: The tricuspid valve  appears unchanged from pre-bypass. _ Pulmonic Valve: The pulmonic valve appears unchanged from pre-bypass. _ Interatrial Septum: The interatrial septum appears unchanged from pre-bypass. _ Pericardium: The pericardium appears unchanged from pre-bypass. _ Comments: Post-bypass images reviewed with surgeon. PRE-OP FINDINGS  Left Ventricle: The left ventricle has mildly reduced systolic function, with an ejection fraction of 45-50%. The cavity size was normal.  LV Wall Scoring: The mid inferior segment is hypokinetic.  Right Ventricle: The right ventricle has normal systolic function. The cavity was normal. There is no increase in right ventricular wall thickness. Left Atrium: Left atrial size was normal in size. No left atrial/left atrial appendage thrombus was detected. Left atrial appendage velocity is reduced at less than 40 cm/s. Right Atrium: Right atrial size was normal in size. Interatrial Septum: No atrial level shunt detected by color flow Doppler. Pericardium: There is no evidence of pericardial effusion. Mitral Valve: The mitral valve is normal in structure. Mitral valve regurgitation is mild by color flow Doppler. Tricuspid Valve: The tricuspid valve was normal in structure. Tricuspid valve regurgitation is trivial by color flow Doppler. Aortic Valve: The aortic valve is tricuspid Aortic valve regurgitation was not visualized by color flow Doppler. There is no stenosis of the aortic valve. Pulmonic Valve: The pulmonic valve was normal in structure. Pulmonic valve regurgitation is not visualized by color flow Doppler. Aorta: The aortic root, ascending aorta and aortic arch are normal in size and structure. 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 Pulmonary Artery: The pulmonary artery is of normal size.  Hoy Morn MD Electronically signed by Hoy Morn MD Signature Date/Time: 07/22/2021/1:41:13 PM    Final    VAS US DOPPLER PRE CABG  Result Date:  07/22/2021 PREOPERATIVE VASCULAR EVALUATION Patient Name:  JOSAFAT ENRICO  Date of Exam:   07/21/2021 Medical Rec #: [id number]       Accession #:    [id number] Date of Birth: 1946-07-11       Patient Gender: M Patient Age:   76 years Exam Location:  [facility] Procedure:      VAS US DOPPLER PRE CABG Referring Phys: Remo Lipps HENDRICKSON --------------------------------------------------------------------------------  Indications:  Pre-CABG.  Risk Factors: Hypertension, coronary artery disease. Performing Technologist: Archie Patten RVS  Examination Guidelines: A complete evaluation includes B-mode imaging, spectral Doppler, color Doppler, and power Doppler as needed of all accessible portions of each vessel. Bilateral testing is considered an integral part of a complete examination. Limited examinations for reoccurring indications may be performed as noted.  Right Carotid Findings: +----------+--------+--------+--------+------------+--------+           PSV cm/sEDV cm/sStenosisDescribe    Comments +----------+--------+--------+--------+------------+--------+ CCA Prox  77      16              heterogenous         +----------+--------+--------+--------+------------+--------+ CCA Distal76      12              heterogenous         +----------+--------+--------+--------+------------+--------+ ICA Prox  79      21      1-39%   heterogenous         +----------+--------+--------+--------+------------+--------+ ICA Distal78      24                                   +----------+--------+--------+--------+------------+--------+ ECA       220     10                                   +----------+--------+--------+--------+------------+--------+ +----------+--------+-------+--------+------------+           PSV cm/sEDV cmsDescribeArm Pressure +----------+--------+-------+--------+------------+ Subclavian218                                  +----------+--------+-------+--------+------------+ +---------+--------+--+--------+-+---------+ VertebralPSV cm/s46EDV cm/s9Antegrade +---------+--------+--+--------+-+---------+ Left Carotid Findings: +----------+--------+--------+--------+-------------------------+--------+           PSV cm/sEDV cm/sStenosisDescribe                 Comments +----------+--------+--------+--------+-------------------------+--------+ CCA Prox  119     23              heterogenous                      +----------+--------+--------+--------+-------------------------+--------+ CCA Distal96      22              heterogenous                      +----------+--------+--------+--------+-------------------------+--------+ ICA Prox  174     45      40-59%  heterogenous and calcific         +----------+--------+--------+--------+-------------------------+--------+ ICA Mid   162     35                                                +----------+--------+--------+--------+-------------------------+--------+ ICA Distal159     50                                                +----------+--------+--------+--------+-------------------------+--------+ ECA       345     26                                                +----------+--------+--------+--------+-------------------------+--------+ +----------+--------+--------+--------+------------+  SubclavianPSV cm/sEDV cm/sDescribeArm Pressure +----------+--------+--------+--------+------------+           140                                  +----------+--------+--------+--------+------------+ +---------+--------+--+--------+--+---------+ VertebralPSV cm/s84EDV cm/s28Antegrade +---------+--------+--+--------+--+---------+  ABI Findings: +--------+------------------+-----+---------+--------+ Right   Rt Pressure (mmHg)IndexWaveform Comment  +--------+------------------+-----+---------+--------+ Brachial                        triphasic         +--------+------------------+-----+---------+--------+ ATA                            biphasic          +--------+------------------+-----+---------+--------+ PTA                            biphasic          +--------+------------------+-----+---------+--------+ +--------+------------------+-----+---------+-------+ Left    Lt Pressure (mmHg)IndexWaveform Comment +--------+------------------+-----+---------+-------+ Brachial                       triphasic        +--------+------------------+-----+---------+-------+ PTA                            biphasic         +--------+------------------+-----+---------+-------+ PERO                           biphasic         +--------+------------------+-----+---------+-------+  Right Doppler Findings: +--------+--------+-----+---------+--------+ Site    PressureIndexDoppler  Comments +--------+--------+-----+---------+--------+ Brachial             triphasic         +--------+--------+-----+---------+--------+ Radial               triphasic         +--------+--------+-----+---------+--------+ Ulnar                triphasic         +--------+--------+-----+---------+--------+  Left Doppler Findings: +--------+--------+-----+---------+--------+ Site    PressureIndexDoppler  Comments +--------+--------+-----+---------+--------+ Brachial             triphasic         +--------+--------+-----+---------+--------+ Radial               triphasic         +--------+--------+-----+---------+--------+ Ulnar                triphasic         +--------+--------+-----+---------+--------+  Summary: Right Carotid: Velocities in the right ICA are consistent with a 1-39% stenosis. Left Carotid: Velocities in the left ICA are consistent with a 40-59% stenosis. Vertebrals: Bilateral vertebral arteries demonstrate antegrade flow. Right Upper Extremity: Unable to complete allens test due to tr  band. Left Upper Extremity: Doppler waveforms remain within normal limits with left radial compression. Doppler waveforms remain within normal limits with left ulnar compression.  Electronically signed by Deitra Mayo MD on 07/22/2021 at 9:45:42 AM.    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 Currently doing well. I discussed path w/ pt--will need outpt followup/metastatic survey. HE has an appt in ~ 2 weeks. OK to d/c foley as usual post-CABG. Will  follow.   LOS: 3 days   Lillette Boxer Khaliah Barnick 07/23/2021, 9:01 AM

## 2021-07-24 ENCOUNTER — Inpatient Hospital Stay (HOSPITAL_COMMUNITY): Payer: Medicare Other

## 2021-07-24 LAB — BASIC METABOLIC PANEL
Anion gap: 8 (ref 5–15)
BUN: 20 mg/dL (ref 8–23)
CO2: 23 mmol/L (ref 22–32)
Calcium: 8.2 mg/dL — ABNORMAL LOW (ref 8.9–10.3)
Chloride: 102 mmol/L (ref 98–111)
Creatinine, Ser: 1.51 mg/dL — ABNORMAL HIGH (ref 0.61–1.24)
GFR, Estimated: 48 mL/min — ABNORMAL LOW (ref >60)
Glucose, Bld: 122 mg/dL — ABNORMAL HIGH (ref 70–99)
Potassium: 3.7 mmol/L (ref 3.5–5.1)
Sodium: 133 mmol/L — ABNORMAL LOW (ref 135–145)

## 2021-07-24 LAB — CBC
HCT: 26.9 % — ABNORMAL LOW (ref 39.0–52.0)
Hemoglobin: 9 g/dL — ABNORMAL LOW (ref 13.0–17.0)
MCH: 31.3 pg (ref 26.0–34.0)
MCHC: 33.5 g/dL (ref 30.0–36.0)
MCV: 93.4 fL (ref 80.0–100.0)
Platelets: 149 10*3/uL — ABNORMAL LOW (ref 150–400)
RBC: 2.88 MIL/uL — ABNORMAL LOW (ref 4.22–5.81)
RDW: 13.3 % (ref 11.5–15.5)
WBC: 16.4 10*3/uL — ABNORMAL HIGH (ref 4.0–10.5)
nRBC: 0 % (ref 0.0–0.2)

## 2021-07-24 LAB — GLUCOSE, CAPILLARY
Glucose-Capillary: 135 mg/dL — ABNORMAL HIGH (ref 70–99)
Glucose-Capillary: 134 mg/dL — ABNORMAL HIGH (ref 70–99)
Glucose-Capillary: 120 mg/dL — ABNORMAL HIGH (ref 70–99)
Glucose-Capillary: 104 mg/dL — ABNORMAL HIGH (ref 70–99)
Glucose-Capillary: 114 mg/dL — ABNORMAL HIGH (ref 70–99)

## 2021-07-24 MED ORDER — POTASSIUM CHLORIDE 10 MEQ/50ML IV SOLN
10.0000 meq | INTRAVENOUS | Status: AC
  Administered 2021-07-24 (×3): 10 meq via INTRAVENOUS
  Filled 2021-07-24: qty 50

## 2021-07-24 MED ORDER — FUROSEMIDE 40 MG PO TABS
40.0000 mg | ORAL_TABLET | Freq: Every day | ORAL | Status: DC
  Administered 2021-07-24: 40 mg via ORAL
  Filled 2021-07-24: qty 1

## 2021-07-24 MED ORDER — POTASSIUM CHLORIDE CRYS ER 20 MEQ PO TBCR
20.0000 meq | EXTENDED_RELEASE_TABLET | Freq: Every day | ORAL | Status: DC
  Administered 2021-07-24: 20 meq via ORAL
  Filled 2021-07-24: qty 1

## 2021-07-24 MED ORDER — METOPROLOL TARTRATE 25 MG PO TABS
25.0000 mg | ORAL_TABLET | Freq: Two times a day (BID) | ORAL | Status: DC
  Administered 2021-07-24 – 2021-08-01 (×17): 25 mg via ORAL
  Filled 2021-07-24 (×17): qty 1

## 2021-07-24 MED FILL — Potassium Chloride Inj 2 mEq/ML: INTRAVENOUS | Qty: 40 | Status: AC

## 2021-07-24 MED FILL — Heparin Sodium (Porcine) Inj 1000 Unit/ML: Qty: 1000 | Status: AC

## 2021-07-24 MED FILL — Magnesium Sulfate Inj 50%: INTRAMUSCULAR | Qty: 10 | Status: AC

## 2021-07-24 NOTE — Progress Notes (Signed)
      [address]       [place] [address]             [phone]      POD # 2 CABG x4  Better day today BP (!) 123/55   Pulse 76   Temp 97.6 F (36.4 C) (Oral)   Resp 14   Ht 5\' 4"  (1.626 m)   Wt 83.2 kg   SpO2 99%   BMI 31.48 kg/m   Intake/Output Summary (Last 24 hours) at 07/24/2021 1819 Last data filed at 07/24/2021 1740 Gross per 24 hour  Intake 471.05 ml  Output 680 ml  Net -208.95 ml   CBG Ok  Doing well POD # 2  Remo Lipps C. Roxan Hockey, MD [facility] [phone]

## 2021-07-24 NOTE — Progress Notes (Signed)
2 Days Post-Op Subjective: Patient w/o c/o--urine yellow. He does not want catheter out yet  Objective: Vital signs in last 24 hours: Temp:  [98.1 F (36.7 C)-99 F (37.2 C)] 98.1 F (36.7 C) (10/20 0730) Pulse Rate:  [59-86] 73 (10/20 0730) Resp:  [10-28] 16 (10/20 0730) BP: (97-134)/(29-64) 126/64 (10/19 2100) SpO2:  [58 %-100 %] 98 % (10/20 0730) Arterial Line BP: (111-178)/(44-77) 147/54 (10/20 0730) Weight:  [83.2 kg] 83.2 kg (10/20 0500)  Intake/Output from previous day: 10/19 0701 - 10/20 0700 In: 671.4 [I.V.:336.7; IV Piggyback:334.8] Out: 815 [Urine:805; Chest Tube:10] Intake/Output this shift: No intake/output data recorded.  Physical Exam:  Constitutional: Vital signs reviewed. WD WN in NAD   Eyes: PERRL, No scleral icterus.   Cardiovascular: RRR Extremities: No cyanosis or edema   Lab Results: Recent Labs    07/23/21 0404 07/23/21 1711 07/24/21 0349  HGB 9.8* 9.7* 9.0*  HCT 29.6* 28.5* 26.9*   BMET Recent Labs    07/23/21 1711 07/24/21 0349  NA 132* 133*  K 4.0 3.7  CL 102 102  CO2 22 23  GLUCOSE 148* 122*  BUN 18 20  CREATININE 1.62* 1.51*  CALCIUM 8.1* 8.2*   Recent Labs    07/22/21 1309  INR 1.4*   No results for input(s): LABURIN in the last 72 hours. Results for orders placed or performed during the hospital encounter of 07/20/21  Blood Culture (routine x 2)     Status: None (Preliminary result)   Collection Time: 07/20/21 10:30 AM   Specimen: BLOOD  Result Value Ref Range Status   Specimen Description   Final    BLOOD BLOOD RIGHT HAND Performed at [facility] [address] [place], [place] [address]    Special Requests   Final    BOTTLES DRAWN AEROBIC AND ANAEROBIC Blood Culture adequate volume Performed at [facility] [address] [place], [place] [address]    Culture   Final    NO GROWTH 3 DAYS Performed at [facility], [place] [address] [place], [place] [address]     Report Status PENDING  Incomplete  Blood Culture (routine x 2)     Status: None (Preliminary result)   Collection Time: 07/20/21 10:38 AM   Specimen: BLOOD  Result Value Ref Range Status   Specimen Description   Final    BLOOD LEFT ANTECUBITAL Performed at [facility] [address], [place], [place] [address]    Special Requests   Final    BOTTLES DRAWN AEROBIC AND ANAEROBIC Blood Culture results may not be optimal due to an excessive volume of blood received in culture bottles Performed at [facility] [address], [place], [place] [address]    Culture   Final    NO GROWTH 3 DAYS Performed at [facility], [place] [address], [place], [place] [address]    Report Status PENDING  Incomplete  Resp Panel by RT-PCR (Flu A&B, Covid) Nasopharyngeal Swab     Status: None   Collection Time: 07/20/21 11:03 AM   Specimen: Nasopharyngeal Swab; Nasopharyngeal(NP) swabs in vial transport medium  Result Value Ref Range Status   SARS Coronavirus 2 by RT PCR NEGATIVE NEGATIVE Final    Comment: (NOTE) SARS-CoV-2 target nucleic acids are NOT DETECTED.  The SARS-CoV-2 RNA is generally detectable in upper respiratory specimens during the acute phase of infection. The lowest concentration of SARS-CoV-2 viral copies this assay can detect is 138 copies/mL.  A negative result does not preclude SARS-Cov-2 infection and should not be used as the sole basis for treatment or other patient management decisions. A negative result may occur with  improper specimen collection/handling, submission of specimen other than nasopharyngeal swab, presence of viral mutation(s) within the areas targeted by this assay, and inadequate number of viral copies(<138 copies/mL). A negative result must be combined with clinical observations, patient history, and epidemiological information. The expected result is Negative.  Fact Sheet for Patients:   EntrepreneurPulse.com.au  Fact Sheet for Healthcare Providers:  IncredibleEmployment.be  This test is no t yet approved or cleared by the [place] FDA and  has been authorized for detection and/or diagnosis of SARS-CoV-2 by FDA under an Emergency Use Authorization (EUA). This EUA will remain  in effect (meaning this test can be used) for the duration of the COVID-19 declaration under Section 564(b)(1) of the Act, 21 U.S.C.section 360bbb-3(b)(1), unless the authorization is terminated  or revoked sooner.       Influenza A by PCR NEGATIVE NEGATIVE Final   Influenza B by PCR NEGATIVE NEGATIVE Final    Comment: (NOTE) The Xpert Xpress SARS-CoV-2/FLU/RSV plus assay is intended as an aid in the diagnosis of influenza from Nasopharyngeal swab specimens and should not be used as a sole basis for treatment. Nasal washings and aspirates are unacceptable for Xpert Xpress SARS-CoV-2/FLU/RSV testing.  Fact Sheet for Patients: EntrepreneurPulse.com.au  Fact Sheet for Healthcare Providers: IncredibleEmployment.be  This test is not yet approved or cleared by the [place] FDA and has been authorized for detection and/or diagnosis of SARS-CoV-2 by FDA under an Emergency Use Authorization (EUA). This EUA will remain in effect (meaning this test can be used) for the duration of the COVID-19 declaration under Section 564(b)(1) of the Act, 21 U.S.C. section 360bbb-3(b)(1), unless the authorization is terminated or revoked.  Performed at [facility], [place] [address] [place], [place] [address]   Urine Culture     Status: None   Collection Time: 07/20/21 11:03 AM   Specimen: In/Out Cath Urine  Result Value Ref Range Status   Specimen Description   Final    IN/OUT CATH URINE Performed at [facility] [address] [place], [place] [address]    Special Requests   Final     NONE Performed at [facility], [place] [address] [place], [place] [address]    Culture   Final    NO GROWTH Performed at [facility], [place] [address] [place], [place] [address]    Report Status 07/21/2021 FINAL  Final  Surgical pcr screen     Status: None   Collection Time: 07/22/21  2:21 AM   Specimen: Nasal Mucosa; Nasal Swab  Result Value Ref Range Status   MRSA, PCR NEGATIVE NEGATIVE Final   Staphylococcus aureus NEGATIVE NEGATIVE Final    Comment: (NOTE) The Xpert SA Assay (FDA approved for NASAL specimens in patients 15 years of age and older), is one component of a comprehensive surveillance program. It is not intended to diagnose infection nor to guide or monitor treatment. Performed at [facility], [place] [address] [place], [place] [address]     Studies/Results: DG Chest Port 1 View  Result Date: 07/23/2021 CLINICAL DATA:  Chest tube present s/p open heart surg,very sore chest EXAM: PORTABLE CHEST - 1 VIEW COMPARISON:  the previous day's study FINDINGS: Interval extubation. Gastric tube removed. Left chest tube stable, no pneumothorax. Right IJ Swan-Ganz stable  to the proximal main PA. Mediastinal drain in place. Persistent left retrocardiac consolidation/atelectasis. Blunting of the left lateral costophrenic angle suggesting small effusion. Heart size upper limits normal. CABG markers and sternotomy wires. Cervical fixation hardware partially visualized. IMPRESSION: 1. Extubation with persistent left retrocardiac consolidation/atelectasis, possible small left effusion. Electronically Signed   By: Lucrezia Europe M.D.   On: 07/23/2021 06:16   DG Chest Port 1 View  Result Date: 07/22/2021 CLINICAL DATA:  Status post CABG. EXAM: PORTABLE CHEST 1 VIEW COMPARISON:  Chest x-ray from yesterday. FINDINGS: Endotracheal tube in position with the tip 4.2 cm above the level of the carina. Enteric tube entering the stomach with the tip below the field of view.  Right internal jugular Swan-Ganz catheter with the tip in the main pulmonary outflow tract. Mediastinal and left chest tubes are in good position. Interval CABG. Unchanged mild cardiomegaly. Low lung volumes. Left basilar atelectasis. Small left pleural effusion no pneumothorax. No acute osseous abnormality. IMPRESSION: 1. Interval CABG. Lines and tubes in satisfactory position. No pneumothorax. 2. Small left pleural effusion with left basilar atelectasis. Electronically Signed   By: Titus Dubin M.D.   On: 07/22/2021 14:30    Assessment/Plan:  POD 7 TURP--presented w/ clot retention/early sepsis. Eval for CP eventually led to CABG. Now POD 2 and doing well.  I'm fine w/ catheter removal @ your leisure--GU will f/u over weekend   LOS: 4 days   Jorja Loa 07/24/2021, 7:50 AM

## 2021-07-24 NOTE — Progress Notes (Signed)
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, ON PUMP, USING LEFT INTERNAL MAMMARY ARTERY AND ENDOSCOPICALLY HARVESTED RIGHT GREATER SAPHENOUS VEIN (N/A) TRANSESOPHAGEAL ECHOCARDIOGRAM (TEE) (N/A) APPLICATION OF CELL SAVER ENDOVEIN HARVEST OF GREATER SAPHENOUS VEIN Subjective: Still c/o incisional pain  Objective: Vital signs in last 24 hours: Temp:  [98.1 F (36.7 C)-99 F (37.2 C)] 98.1 F (36.7 C) (10/20 0730) Pulse Rate:  [60-86] 73 (10/20 0730) Cardiac Rhythm: Normal sinus rhythm (10/20 0400) Resp:  [10-28] 16 (10/20 0730) BP: (97-134)/(29-64) 126/64 (10/19 2100) SpO2:  [58 %-100 %] 98 % (10/20 0730) Arterial Line BP: (111-178)/(44-77) 147/54 (10/20 0730) Weight:  [83.2 kg] 83.2 kg (10/20 0500)  Hemodynamic parameters for last 24 hours: PAP: (33-44)/(12-17) 44/17  Intake/Output from previous day: 10/19 0701 - 10/20 0700 In: 671.4 [I.V.:336.7; IV Piggyback:334.8] Out: 815 [Urine:805; Chest Tube:10] Intake/Output this shift: No intake/output data recorded.  General appearance: alert, cooperative, and no distress Neurologic: intact Heart: regular rate and rhythm Lungs: diminished breath sounds bibasilar and L>R Abdomen: normal findings: soft, non-tender Wound: dressing clean and dry  Lab Results: Recent Labs    07/23/21 1711 07/24/21 0349  WBC 17.4* 16.4*  HGB 9.7* 9.0*  HCT 28.5* 26.9*  PLT 141* 149*   BMET:  Recent Labs    07/23/21 1711 07/24/21 0349  NA 132* 133*  K 4.0 3.7  CL 102 102  CO2 22 23  GLUCOSE 148* 122*  BUN 18 20  CREATININE 1.62* 1.51*  CALCIUM 8.1* 8.2*    PT/INR:  Recent Labs    07/22/21 1309  LABPROT 17.4*  INR 1.4*   ABG    Component Value Date/Time   PHART 7.358 07/22/2021 1840   HCO3 20.8 07/22/2021 1840   TCO2 22 07/22/2021 1840   ACIDBASEDEF 4.0 (H) 07/22/2021 1840   O2SAT 96.0 07/22/2021 1840   CBG (last 3)  Recent Labs    07/23/21 2310 07/24/21 0344 07/24/21 0654   GLUCAP 140* 135* 134*    Assessment/Plan: S/P Procedure(s) (LRB): 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, ON PUMP, USING LEFT INTERNAL MAMMARY ARTERY AND ENDOSCOPICALLY HARVESTED RIGHT GREATER SAPHENOUS VEIN (N/A) TRANSESOPHAGEAL ECHOCARDIOGRAM (TEE) (N/A) APPLICATION OF CELL SAVER ENDOVEIN HARVEST OF GREATER SAPHENOUS VEIN POD # 2 NEURO- intact CV- in SR, still on NTG drip  Increase metoprolol to home dose, continue Rhythmol  Resume Pradax prior to DC RESP_ LLL atelectasis/ effusion- diurese, Flutter, IS RENAL- creatinine elevated but a little lower this AM  C/w acute kidney injury  PO Lasix  Leave Foley in place due to retention ENDO_ CBG mildly elevated, change SSI to Ac and HS Gi- tolerating diet SCD + enoxaparin for DVT prophylaxis Ambulate   LOS: 4 days    Melrose Nakayama 07/24/2021

## 2021-07-25 ENCOUNTER — Inpatient Hospital Stay (HOSPITAL_COMMUNITY): Payer: Medicare Other

## 2021-07-25 DIAGNOSIS — Z951 Presence of aortocoronary bypass graft: Secondary | ICD-10-CM

## 2021-07-25 LAB — TYPE AND SCREEN
ABO/RH(D): O POS
Antibody Screen: NEGATIVE
Unit division: 0
Unit division: 0
Unit division: 0
Unit division: 0

## 2021-07-25 LAB — CULTURE, BLOOD (ROUTINE X 2)
Culture: NO GROWTH
Culture: NO GROWTH
Special Requests: ADEQUATE

## 2021-07-25 LAB — GLUCOSE, CAPILLARY
Glucose-Capillary: 102 mg/dL — ABNORMAL HIGH (ref 70–99)
Glucose-Capillary: 87 mg/dL (ref 70–99)
Glucose-Capillary: 105 mg/dL — ABNORMAL HIGH (ref 70–99)
Glucose-Capillary: 105 mg/dL — ABNORMAL HIGH (ref 70–99)

## 2021-07-25 LAB — MAGNESIUM: Magnesium: 2 mg/dL (ref 1.7–2.4)

## 2021-07-25 LAB — BASIC METABOLIC PANEL
Anion gap: 8 (ref 5–15)
BUN: 30 mg/dL — ABNORMAL HIGH (ref 8–23)
CO2: 26 mmol/L (ref 22–32)
Calcium: 8.5 mg/dL — ABNORMAL LOW (ref 8.9–10.3)
Chloride: 98 mmol/L (ref 98–111)
Creatinine, Ser: 1.6 mg/dL — ABNORMAL HIGH (ref 0.61–1.24)
GFR, Estimated: 45 mL/min — ABNORMAL LOW (ref >60)
Glucose, Bld: 107 mg/dL — ABNORMAL HIGH (ref 70–99)
Potassium: 4.8 mmol/L (ref 3.5–5.1)
Sodium: 132 mmol/L — ABNORMAL LOW (ref 135–145)

## 2021-07-25 LAB — CBC
HCT: 27.9 % — ABNORMAL LOW (ref 39.0–52.0)
Hemoglobin: 8.9 g/dL — ABNORMAL LOW (ref 13.0–17.0)
MCH: 30.2 pg (ref 26.0–34.0)
MCHC: 31.9 g/dL (ref 30.0–36.0)
MCV: 94.6 fL (ref 80.0–100.0)
Platelets: 158 10*3/uL (ref 150–400)
RBC: 2.95 MIL/uL — ABNORMAL LOW (ref 4.22–5.81)
RDW: 13.2 % (ref 11.5–15.5)
WBC: 14.2 10*3/uL — ABNORMAL HIGH (ref 4.0–10.5)
nRBC: 0 % (ref 0.0–0.2)

## 2021-07-25 LAB — BPAM RBC
Blood Product Expiration Date: 202211072359
Blood Product Expiration Date: 202211102359
Blood Product Expiration Date: 202211102359
Blood Product Expiration Date: 202211102359
ISSUE DATE / TIME: 202210180816
ISSUE DATE / TIME: 202210180816
Unit Type and Rh: 5100
Unit Type and Rh: 5100
Unit Type and Rh: 5100
Unit Type and Rh: 5100

## 2021-07-25 MED ORDER — ASPIRIN EC 81 MG PO TBEC
81.0000 mg | DELAYED_RELEASE_TABLET | Freq: Every day | ORAL | Status: DC
  Administered 2021-07-25 – 2021-07-31 (×6): 81 mg via ORAL
  Filled 2021-07-25 (×7): qty 1

## 2021-07-25 MED ORDER — SODIUM CHLORIDE 0.9 % IV SOLN
12.5000 mg | Freq: Four times a day (QID) | INTRAVENOUS | Status: DC | PRN
  Administered 2021-07-25: 12.5 mg via INTRAVENOUS
  Filled 2021-07-25 (×2): qty 0.5

## 2021-07-25 MED ORDER — ENOXAPARIN SODIUM 30 MG/0.3ML IJ SOSY
30.0000 mg | PREFILLED_SYRINGE | Freq: Every day | INTRAMUSCULAR | Status: DC
  Administered 2021-07-25 – 2021-07-26 (×2): 30 mg via SUBCUTANEOUS
  Filled 2021-07-25 (×2): qty 0.3

## 2021-07-25 MED ORDER — METOCLOPRAMIDE HCL 5 MG/ML IJ SOLN
10.0000 mg | Freq: Four times a day (QID) | INTRAMUSCULAR | Status: AC
  Administered 2021-07-25 – 2021-07-26 (×6): 10 mg via INTRAVENOUS
  Filled 2021-07-25 (×6): qty 2

## 2021-07-25 MED ORDER — SODIUM CHLORIDE 0.9 % IV SOLN
12.5000 mg | Freq: Four times a day (QID) | INTRAVENOUS | Status: DC | PRN
  Administered 2021-07-25: 12.5 mg via INTRAVENOUS
  Filled 2021-07-25 (×3): qty 0.5

## 2021-07-25 NOTE — Progress Notes (Signed)
Patient ID: Ryan [name], male   DOB: [birth year], 75 y.o.   MRN: [id number] TCTS Evening Rounds:  Hemodynamically stable in sinus rhythm.  Has had BM x 2 today. Still some nausea.  Will start clear liquids as tolerated.

## 2021-07-25 NOTE — Care Management (Signed)
1457 07-25-21 POD-3 CABG- Case Manager will continue to follow for disposition needs as the patient progresses.

## 2021-07-25 NOTE — Progress Notes (Signed)
3 Days Post-Op Procedure(s) (LRB): 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, ON PUMP, USING LEFT INTERNAL MAMMARY ARTERY AND ENDOSCOPICALLY HARVESTED RIGHT GREATER SAPHENOUS VEIN (N/A) TRANSESOPHAGEAL ECHOCARDIOGRAM (TEE) (N/A) APPLICATION OF CELL SAVER ENDOVEIN HARVEST OF GREATER SAPHENOUS VEIN Subjective: Nausea and vomiting overnight Nausea better this Am but c/o abdominal bloating and hiccups  Objective: Vital signs in last 24 hours: Temp:  [97.6 F (36.4 C)-98.6 F (37 C)] 98.6 F (37 C) (10/21 0700) Pulse Rate:  [59-80] 80 (10/21 0700) Cardiac Rhythm: Sinus bradycardia (10/21 0752) Resp:  [8-23] 16 (10/21 0700) BP: (95-146)/(48-109) 133/62 (10/21 0700) SpO2:  [91 %-100 %] 96 % (10/21 0700) Weight:  [85.1 kg] 85.1 kg (10/21 0500)  Hemodynamic parameters for last 24 hours:    Intake/Output from previous day: 10/20 0701 - 10/21 0700 In: 717.5 [P.O.:360; I.V.:187.7; IV Piggyback:169.9] Out: 700 [Urine:700] Intake/Output this shift: No intake/output data recorded.  General appearance: alert, cooperative, and mild distress Neurologic: intact Heart: regular rate and rhythm Lungs: diminished breath sounds bibasilar Abdomen: distended, tympanitic, mild tenderness but no peritoneal signs  Lab Results: Recent Labs    07/24/21 0349 07/25/21 0223  WBC 16.4* 14.2*  HGB 9.0* 8.9*  HCT 26.9* 27.9*  PLT 149* 158   BMET:  Recent Labs    07/24/21 0349 07/25/21 0223  NA 133* 132*  K 3.7 4.8  CL 102 98  CO2 23 26  GLUCOSE 122* 107*  BUN 20 30*  CREATININE 1.51* 1.60*  CALCIUM 8.2* 8.5*    PT/INR:  Recent Labs    07/22/21 1309  LABPROT 17.4*  INR 1.4*   ABG    Component Value Date/Time   PHART 7.358 07/22/2021 1840   HCO3 20.8 07/22/2021 1840   TCO2 22 07/22/2021 1840   ACIDBASEDEF 4.0 (H) 07/22/2021 1840   O2SAT 96.0 07/22/2021 1840   CBG (last 3)  Recent Labs    07/24/21 1604 07/24/21 1927 07/25/21 0744  GLUCAP 104* 114* 102*     Assessment/Plan: S/P Procedure(s) (LRB): 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, ON PUMP, USING LEFT INTERNAL MAMMARY ARTERY AND ENDOSCOPICALLY HARVESTED RIGHT GREATER SAPHENOUS VEIN (N/A) TRANSESOPHAGEAL ECHOCARDIOGRAM (TEE) (N/A) APPLICATION OF CELL SAVER ENDOVEIN HARVEST OF GREATER SAPHENOUS VEIN POD # 3 NEURO- intact CV- in SR  Will dc pacing wires in anticipation of restarting Pradaxa in next 24-48 hours RESP_ continue IS for atelectasis RENAL- creatinine remains mildly elevated, stable, follow  Hold off on diuresis until ileus improves  Will leave Foley in today GI- N/V last night, abdomen distended. KUB shows air into rectum but dilated bowel loops, await official read. NPO, ice chips and sips  Reglan, thorazine PRN for hiccups ENDO- CBG well controlled Scd + enoxaparin until pradaxa resumed Mobilize as tolerated   LOS: 5 days    Melrose Nakayama 07/25/2021

## 2021-07-25 NOTE — Progress Notes (Signed)
Epicardial pacing wires removed as per protocol.  Tolerated well.

## 2021-07-26 ENCOUNTER — Inpatient Hospital Stay (HOSPITAL_COMMUNITY): Payer: Medicare Other

## 2021-07-26 LAB — GLUCOSE, CAPILLARY
Glucose-Capillary: 101 mg/dL — ABNORMAL HIGH (ref 70–99)
Glucose-Capillary: 111 mg/dL — ABNORMAL HIGH (ref 70–99)
Glucose-Capillary: 113 mg/dL — ABNORMAL HIGH (ref 70–99)
Glucose-Capillary: 124 mg/dL — ABNORMAL HIGH (ref 70–99)
Glucose-Capillary: 94 mg/dL (ref 70–99)
Glucose-Capillary: 96 mg/dL (ref 70–99)
Glucose-Capillary: 99 mg/dL (ref 70–99)

## 2021-07-26 LAB — BASIC METABOLIC PANEL
Anion gap: 8 (ref 5–15)
BUN: 39 mg/dL — ABNORMAL HIGH (ref 8–23)
CO2: 23 mmol/L (ref 22–32)
Calcium: 8 mg/dL — ABNORMAL LOW (ref 8.9–10.3)
Chloride: 98 mmol/L (ref 98–111)
Creatinine, Ser: 1.21 mg/dL (ref 0.61–1.24)
GFR, Estimated: >60 mL/min (ref >60)
Glucose, Bld: 110 mg/dL — ABNORMAL HIGH (ref 70–99)
Potassium: 4.7 mmol/L (ref 3.5–5.1)
Sodium: 129 mmol/L — ABNORMAL LOW (ref 135–145)

## 2021-07-26 LAB — CBC
HCT: 26.6 % — ABNORMAL LOW (ref 39.0–52.0)
Hemoglobin: 8.8 g/dL — ABNORMAL LOW (ref 13.0–17.0)
MCH: 30.7 pg (ref 26.0–34.0)
MCHC: 33.1 g/dL (ref 30.0–36.0)
MCV: 92.7 fL (ref 80.0–100.0)
Platelets: 204 10*3/uL (ref 150–400)
RBC: 2.87 MIL/uL — ABNORMAL LOW (ref 4.22–5.81)
RDW: 13 % (ref 11.5–15.5)
WBC: 14.7 10*3/uL — ABNORMAL HIGH (ref 4.0–10.5)
nRBC: 0 % (ref 0.0–0.2)

## 2021-07-26 MED ORDER — BACLOFEN 5 MG HALF TABLET
5.0000 mg | ORAL_TABLET | Freq: Three times a day (TID) | ORAL | Status: DC
  Administered 2021-07-26 – 2021-07-27 (×2): 5 mg via ORAL
  Filled 2021-07-26 (×2): qty 1

## 2021-07-26 MED ORDER — MAGIC MOUTHWASH
10.0000 mL | Freq: Three times a day (TID) | ORAL | Status: AC
  Administered 2021-07-26 – 2021-07-29 (×9): 10 mL via ORAL
  Filled 2021-07-26 (×9): qty 10

## 2021-07-26 NOTE — Progress Notes (Signed)
Patient ID: DAEMON DOWTY, male   DOB: Nov 30, 1945, 75 y.o.   MRN: [id number] TCTS Evening Rounds:  Hemodynamically stable   Remains in atrial fib with rate 100-115.  On Rhythmol. Use IV Lopressor as needed for rate control.  UO ok  Passing flatus but no further BM's today.  Still having persistent hiccups. Chlorpromazine not working so will switch to Baclofen to see if that works. He is allergic to gabapentin which is another first line drug. He has been receiving Reglan.

## 2021-07-26 NOTE — Progress Notes (Signed)
4 Days Post-Op Procedure(s) (LRB): 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, ON PUMP, USING LEFT INTERNAL MAMMARY ARTERY AND ENDOSCOPICALLY HARVESTED RIGHT GREATER SAPHENOUS VEIN (N/A) TRANSESOPHAGEAL ECHOCARDIOGRAM (TEE) (N/A) APPLICATION OF CELL SAVER ENDOVEIN HARVEST OF GREATER SAPHENOUS VEIN Subjective: Doesn't feel great. Still having hiccups persistently despite thorazine. Had a couple BM's yesterday and a little liquid stool this am. No nausea.  Objective: Vital signs in last 24 hours: Temp:  [97.6 F (36.4 C)-98.8 F (37.1 C)] 98.8 F (37.1 C) (10/22 0746) Pulse Rate:  [63-117] 79 (10/22 1015) Cardiac Rhythm: Atrial fibrillation (10/22 1015) Resp:  [12-24] 24 (10/22 1015) BP: (100-164)/(38-126) 122/77 (10/22 1015) SpO2:  [89 %-100 %] 95 % (10/22 1015) Weight:  [85.1 kg] 85.1 kg (10/22 0500)  Hemodynamic parameters for last 24 hours:    Intake/Output from previous day: 10/21 0701 - 10/22 0700 In: 205 [P.O.:150; I.V.:12.9; IV Piggyback:42.1] Out: 980 [Urine:980] Intake/Output this shift: Total I/O In: 400 [P.O.:400] Out: 80 [Urine:80]  General appearance: alert and cooperative Neurologic: intact Heart: irregularly irregular rhythm Lungs: diminished breath sounds bibasilar Abdomen: mildly distended,  non-tender; bowel sounds hypoactive. Extremities: edema mild Wound: incision ok  Lab Results: Recent Labs    07/25/21 0223 07/26/21 0253  WBC 14.2* 14.7*  HGB 8.9* 8.8*  HCT 27.9* 26.6*  PLT 158 204   BMET:  Recent Labs    07/25/21 0223 07/26/21 0253  NA 132* 129*  K 4.8 4.7  CL 98 98  CO2 26 23  GLUCOSE 107* 110*  BUN 30* 39*  CREATININE 1.60* 1.21  CALCIUM 8.5* 8.0*    PT/INR: No results for input(s): LABPROT, INR in the last 72 hours. ABG    Component Value Date/Time   PHART 7.358 07/22/2021 1840   HCO3 20.8 07/22/2021 1840   TCO2 22 07/22/2021 1840   ACIDBASEDEF 4.0 (H) 07/22/2021 1840   O2SAT 96.0 07/22/2021 1840   CBG (last 3)   Recent Labs    07/25/21 2344 07/26/21 0426 07/26/21 0741  GLUCAP 113* 99 96   KUB: resolving ileus with gas throughout colon. No dilated loops of bowel.  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 Did not receive dose of Rhythmol last pm. Will give some IV Lopressor this am to see if ne will convert.  Postop ileus: resolving. Continue clear liquids.  Volume excess: wt is 12 lbs over preop. Hold on diuresis until ileus resolved.   IS, ambulation  Keep foley in with abdominal distension and recent TURP.   LOS: 6 days    Ryan [name] 07/26/2021

## 2021-07-27 LAB — BASIC METABOLIC PANEL
Anion gap: 6 (ref 5–15)
BUN: 29 mg/dL — ABNORMAL HIGH (ref 8–23)
CO2: 24 mmol/L (ref 22–32)
Calcium: 8.1 mg/dL — ABNORMAL LOW (ref 8.9–10.3)
Chloride: 99 mmol/L (ref 98–111)
Creatinine, Ser: 1.18 mg/dL (ref 0.61–1.24)
GFR, Estimated: >60 mL/min (ref >60)
Glucose, Bld: 109 mg/dL — ABNORMAL HIGH (ref 70–99)
Potassium: 4.4 mmol/L (ref 3.5–5.1)
Sodium: 129 mmol/L — ABNORMAL LOW (ref 135–145)

## 2021-07-27 LAB — CBC
HCT: 24.2 % — ABNORMAL LOW (ref 39.0–52.0)
Hemoglobin: 7.9 g/dL — ABNORMAL LOW (ref 13.0–17.0)
MCH: 30.2 pg (ref 26.0–34.0)
MCHC: 32.6 g/dL (ref 30.0–36.0)
MCV: 92.4 fL (ref 80.0–100.0)
Platelets: 238 10*3/uL (ref 150–400)
RBC: 2.62 MIL/uL — ABNORMAL LOW (ref 4.22–5.81)
RDW: 12.9 % (ref 11.5–15.5)
WBC: 13 10*3/uL — ABNORMAL HIGH (ref 4.0–10.5)
nRBC: 0 % (ref 0.0–0.2)

## 2021-07-27 LAB — GLUCOSE, CAPILLARY
Glucose-Capillary: 106 mg/dL — ABNORMAL HIGH (ref 70–99)
Glucose-Capillary: 108 mg/dL — ABNORMAL HIGH (ref 70–99)
Glucose-Capillary: 112 mg/dL — ABNORMAL HIGH (ref 70–99)
Glucose-Capillary: 120 mg/dL — ABNORMAL HIGH (ref 70–99)
Glucose-Capillary: 129 mg/dL — ABNORMAL HIGH (ref 70–99)
Glucose-Capillary: 93 mg/dL (ref 70–99)

## 2021-07-27 MED ORDER — BACLOFEN 10 MG PO TABS
10.0000 mg | ORAL_TABLET | Freq: Three times a day (TID) | ORAL | Status: DC
  Administered 2021-07-27 – 2021-07-30 (×9): 10 mg via ORAL
  Filled 2021-07-27 (×10): qty 1

## 2021-07-27 MED ORDER — POTASSIUM CHLORIDE CRYS ER 20 MEQ PO TBCR
40.0000 meq | EXTENDED_RELEASE_TABLET | Freq: Once | ORAL | Status: AC
  Administered 2021-07-27: 40 meq via ORAL
  Filled 2021-07-27: qty 2

## 2021-07-27 MED ORDER — DABIGATRAN ETEXILATE MESYLATE 150 MG PO CAPS
150.0000 mg | ORAL_CAPSULE | Freq: Two times a day (BID) | ORAL | Status: DC
  Administered 2021-07-27 – 2021-08-01 (×11): 150 mg via ORAL
  Filled 2021-07-27 (×12): qty 1

## 2021-07-27 MED ORDER — POTASSIUM CHLORIDE CRYS ER 20 MEQ PO TBCR
40.0000 meq | EXTENDED_RELEASE_TABLET | Freq: Once | ORAL | Status: DC
  Administered 2021-07-27: 40 meq via ORAL
  Filled 2021-07-27: qty 2

## 2021-07-27 MED ORDER — TORSEMIDE 20 MG PO TABS
20.0000 mg | ORAL_TABLET | Freq: Every day | ORAL | Status: DC
  Administered 2021-07-27 – 2021-07-30 (×4): 20 mg via ORAL
  Filled 2021-07-27 (×4): qty 1

## 2021-07-27 MED ORDER — FE FUMARATE-B12-VIT C-FA-IFC PO CAPS
1.0000 | ORAL_CAPSULE | Freq: Two times a day (BID) | ORAL | Status: DC
  Administered 2021-07-27 – 2021-08-01 (×10): 1 via ORAL
  Filled 2021-07-27 (×10): qty 1

## 2021-07-27 NOTE — Progress Notes (Addendum)
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  Doesn't feel great but nothing specific.  Passing flatus, no BM yesterday.    Objective: Vital signs in last 24 hours: Temp:  [98.1 F (36.7 C)-98.8 F (37.1 C)] 98.8 F (37.1 C) (10/23 0728) Pulse Rate:  [74-136] 110 (10/23 1000) Cardiac Rhythm: Atrial fibrillation (10/23 0800) Resp:  [13-24] 19 (10/23 1000) BP: (91-141)/(48-89) 127/83 (10/23 1000) SpO2:  [94 %-99 %] 99 % (10/23 1000) Weight:  [85 kg] 85 kg (10/23 0500)  Hemodynamic parameters for last 24 hours:    Intake/Output from previous day: 10/22 0701 - 10/23 0700 In: 1240 [P.O.:1240] Out: 1735 [Urine:1735] Intake/Output this shift: Total I/O In: 480 [P.O.:480] Out: 200 [Urine:200]  General appearance: alert and cooperative Neurologic: intact Heart: irregularly irregular rhythm Lungs: clear to auscultation bilaterally Abdomen: soft, non-tender; bowel sounds normal, minimal to no distension. Extremities: edema mild Wound: incision ok  Lab Results: Recent Labs    07/26/21 0253 07/27/21 0051  WBC 14.7* 13.0*  HGB 8.8* 7.9*  HCT 26.6* 24.2*  PLT 204 238   BMET:  Recent Labs    07/26/21 0253 07/27/21 0051  NA 129* 129*  K 4.7 4.4  CL 98 99  CO2 23 24  GLUCOSE 110* 109*  BUN 39* 29*  CREATININE 1.21 1.18  CALCIUM 8.0* 8.1*    PT/INR: No results for input(s): LABPROT, INR in the last 72 hours. 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 Will resume Pradaxa for anticoagulation since pacing wires are out. Continue ASA 81 by bypasses.  Volume excess: Wt is about 12 lbs over preop. Will start gentle diuresis.  Postop acute blood loss anemia: iron started.  Postop ileus: resolving. Belly is benign.  Persistent hiccups: I think this is wearing him out more than anything else. Will increase Baclofen to 10 tid.  IS, ambulation.   LOS: 7 days    Gaye Pollack 07/27/2021

## 2021-07-27 NOTE — Progress Notes (Signed)
Patient ID: Ryan [name], male   DOB: 1945/10/07, 75 y.o.   MRN: [id number] TCTS Evening Rounds:  Hemodynamically stable in atrial fib 120-130, walking around. Rate comes down at rest.  Passing flatus but no BM. Appetite coming back so will advance diet as tolerated.  Feels better today.

## 2021-07-27 NOTE — Progress Notes (Signed)
Patient ID: Ryan [name], male   DOB: 17-Sep-1946, 75 y.o.   MRN: [id number]   Pt recovering from CABG.  Urine clear.  He has not been ready for Foley removal yet.  Ok to remove Foley once appropriate from a CT surgery standpoint.  Will follow to ensure he is emptying appropriately upon catheter removal consider recent TURP.

## 2021-07-28 LAB — CBC
HCT: 24.6 % — ABNORMAL LOW (ref 39.0–52.0)
Hemoglobin: 8.2 g/dL — ABNORMAL LOW (ref 13.0–17.0)
MCH: 30.7 pg (ref 26.0–34.0)
MCHC: 33.3 g/dL (ref 30.0–36.0)
MCV: 92.1 fL (ref 80.0–100.0)
Platelets: 278 10*3/uL (ref 150–400)
RBC: 2.67 MIL/uL — ABNORMAL LOW (ref 4.22–5.81)
RDW: 13.1 % (ref 11.5–15.5)
WBC: 13.6 10*3/uL — ABNORMAL HIGH (ref 4.0–10.5)
nRBC: 0 % (ref 0.0–0.2)

## 2021-07-28 LAB — BASIC METABOLIC PANEL
Anion gap: 8 (ref 5–15)
BUN: 24 mg/dL — ABNORMAL HIGH (ref 8–23)
CO2: 25 mmol/L (ref 22–32)
Calcium: 8.2 mg/dL — ABNORMAL LOW (ref 8.9–10.3)
Chloride: 98 mmol/L (ref 98–111)
Creatinine, Ser: 1.18 mg/dL (ref 0.61–1.24)
GFR, Estimated: >60 mL/min (ref >60)
Glucose, Bld: 106 mg/dL — ABNORMAL HIGH (ref 70–99)
Potassium: 4.2 mmol/L (ref 3.5–5.1)
Sodium: 131 mmol/L — ABNORMAL LOW (ref 135–145)

## 2021-07-28 LAB — GLUCOSE, CAPILLARY
Glucose-Capillary: 103 mg/dL — ABNORMAL HIGH (ref 70–99)
Glucose-Capillary: 102 mg/dL — ABNORMAL HIGH (ref 70–99)
Glucose-Capillary: 113 mg/dL — ABNORMAL HIGH (ref 70–99)

## 2021-07-28 MED ORDER — POTASSIUM CHLORIDE CRYS ER 10 MEQ PO TBCR
10.0000 meq | EXTENDED_RELEASE_TABLET | Freq: Every day | ORAL | Status: DC
  Administered 2021-07-28 – 2021-07-29 (×2): 10 meq via ORAL
  Filled 2021-07-28 (×2): qty 1

## 2021-07-28 MED ORDER — MAGNESIUM OXIDE -MG SUPPLEMENT 400 (240 MG) MG PO TABS
400.0000 mg | ORAL_TABLET | Freq: Two times a day (BID) | ORAL | Status: DC
  Administered 2021-07-28 – 2021-08-01 (×9): 400 mg via ORAL
  Filled 2021-07-28 (×9): qty 1

## 2021-07-28 MED ORDER — ACETAMINOPHEN 500 MG PO TABS
1000.0000 mg | ORAL_TABLET | Freq: Four times a day (QID) | ORAL | Status: DC | PRN
  Administered 2021-07-29: 1000 mg via ORAL
  Filled 2021-07-28: qty 2

## 2021-07-28 NOTE — Progress Notes (Signed)
CARDIAC REHAB PHASE I   Went to offer to walk with pt. Pt just back to bed after ambulating with RN. RN states some fatigue, back into bed after sitting most of the morning. Will continue to follow.  Rufina Falco, RN BSN 07/28/2021 1:13 PM

## 2021-07-28 NOTE — Progress Notes (Addendum)
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, ON PUMP, USING LEFT INTERNAL MAMMARY ARTERY AND ENDOSCOPICALLY HARVESTED RIGHT GREATER SAPHENOUS VEIN (N/A) TRANSESOPHAGEAL ECHOCARDIOGRAM (TEE) (N/A) APPLICATION OF CELL SAVER ENDOVEIN HARVEST OF GREATER SAPHENOUS VEIN Subjective: C/o incisional pain and hiccups  Objective: Vital signs in last 24 hours: Temp:  [97.9 F (36.6 C)-98.7 F (37.1 C)] 97.9 F (36.6 C) (10/24 0700) Pulse Rate:  [82-122] 97 (10/24 0600) Cardiac Rhythm: Atrial fibrillation (10/24 0600) Resp:  [12-23] 12 (10/24 0600) BP: (101-163)/(56-101) 105/64 (10/24 0600) SpO2:  [95 %-100 %] 96 % (10/24 0600) Weight:  [84.8 kg] 84.8 kg (10/24 0500)  Hemodynamic parameters for last 24 hours:    Intake/Output from previous day: 10/23 0701 - 10/24 0700 In: 1090 [P.O.:1090] Out: 3855 [Urine:3855] Intake/Output this shift: No intake/output data recorded.  General appearance: alert, cooperative, and no distress Neurologic: intact Heart: irregularly irregular rhythm Lungs: diminished breath sounds bibasilar Abdomen: normal findings: soft, non-tender Wound: clean and dry  Lab Results: Recent Labs    07/27/21 0051 07/28/21 0133  WBC 13.0* 13.6*  HGB 7.9* 8.2*  HCT 24.2* 24.6*  PLT 238 278   BMET:  Recent Labs    07/27/21 0051 07/28/21 0133  NA 129* 131*  K 4.4 4.2  CL 99 98  CO2 24 25  GLUCOSE 109* 106*  BUN 29* 24*  CREATININE 1.18 1.18  CALCIUM 8.1* 8.2*    PT/INR: No results for input(s): LABPROT, INR in the last 72 hours. ABG    Component Value Date/Time   PHART 7.358 07/22/2021 1840   HCO3 20.8 07/22/2021 1840   TCO2 22 07/22/2021 1840   ACIDBASEDEF 4.0 (H) 07/22/2021 1840   O2SAT 96.0 07/22/2021 1840   CBG (last 3)  Recent Labs    07/27/21 2337 07/28/21 0423 07/28/21 0649  GLUCAP 112* 103* 102*    Assessment/Plan: S/P Procedure(s) (LRB): 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, ON PUMP,  USING LEFT INTERNAL MAMMARY ARTERY AND ENDOSCOPICALLY HARVESTED RIGHT GREATER SAPHENOUS VEIN (N/A) TRANSESOPHAGEAL ECHOCARDIOGRAM (TEE) (N/A) APPLICATION OF CELL SAVER ENDOVEIN HARVEST OF GREATER SAPHENOUS VEIN Plan for transfer to step-down: see transfer orders POD # 6  CV- remains in atrial fib- continue lopressor, propafenone  On Pradaxa RESP- continue IS RENAL- weight still up  Continue torsemide  Dc Foley ENDO- CBG well controlled- dc CBG GI- ileus improved Transfer to progressive bed   LOS: 8 days    Melrose Nakayama 07/28/2021

## 2021-07-28 NOTE — Progress Notes (Signed)
EVENING ROUNDS NOTE :     [address]       [place] [address]             [phone]                 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, ON PUMP, USING LEFT INTERNAL MAMMARY ARTERY AND ENDOSCOPICALLY HARVESTED RIGHT GREATER SAPHENOUS VEIN (N/A) TRANSESOPHAGEAL ECHOCARDIOGRAM (TEE) (N/A) APPLICATION OF CELL SAVER ENDOVEIN HARVEST OF GREATER SAPHENOUS VEIN   Total Length of Stay:  LOS: 8 days  Events:   No events Remains in afib     BP 98/60 (BP Location: Left Arm)   Pulse (!) 102   Temp 97.9 F (36.6 C)   Resp 16   Ht 5\' 4"  (1.626 m)   Wt 84.8 kg   SpO2 91%   BMI 32.09 kg/m          sodium chloride Stopped (07/25/21 0817)    I/O last 3 completed shifts: In: 1090 [P.O.:1090] Out: 4815 [Urine:4815]   CBC Latest Ref Rng & Units 07/28/2021 07/27/2021 07/26/2021  WBC 4.0 - 10.5 K/uL 13.6(H) 13.0(H) 14.7(H)  Hemoglobin 13.0 - 17.0 g/dL 8.2(L) 7.9(L) 8.8(L)  Hematocrit 39.0 - 52.0 % 24.6(L) 24.2(L) 26.6(L)  Platelets 150 - 400 K/uL 278 238 204    BMP Latest Ref Rng & Units 07/28/2021 07/27/2021 07/26/2021  Glucose 70 - 99 mg/dL 106(H) 109(H) 110(H)  BUN 8 - 23 mg/dL 24(H) 29(H) 39(H)  Creatinine 0.61 - 1.24 mg/dL 1.18 1.18 1.21  BUN/Creat Ratio 6 - 22 (calc) - - -  Sodium 135 - 145 mmol/L 131(L) 129(L) 129(L)  Potassium 3.5 - 5.1 mmol/L 4.2 4.4 4.7  Chloride 98 - 111 mmol/L 98 99 98  CO2 22 - 32 mmol/L 25 24 23   Calcium 8.9 - 10.3 mg/dL 8.2(L) 8.1(L) 8.0(L)    ABG    Component Value Date/Time   PHART 7.358 07/22/2021 1840   PCO2ART 37.0 07/22/2021 1840   PO2ART 86 07/22/2021 1840   HCO3 20.8 07/22/2021 1840   TCO2 22 07/22/2021 1840   ACIDBASEDEF 4.0 (H) 07/22/2021 1840   O2SAT 96.0 07/22/2021 1840       Melodie Bouillon, MD 07/28/2021 4:27 PM

## 2021-07-29 ENCOUNTER — Inpatient Hospital Stay (HOSPITAL_COMMUNITY): Payer: Medicare Other

## 2021-07-29 LAB — BASIC METABOLIC PANEL
Anion gap: 7 (ref 5–15)
Anion gap: 10 (ref 5–15)
BUN: 21 mg/dL (ref 8–23)
BUN: 23 mg/dL (ref 8–23)
CO2: 27 mmol/L (ref 22–32)
CO2: 26 mmol/L (ref 22–32)
Calcium: 8.5 mg/dL — ABNORMAL LOW (ref 8.9–10.3)
Calcium: 8.5 mg/dL — ABNORMAL LOW (ref 8.9–10.3)
Chloride: 97 mmol/L — ABNORMAL LOW (ref 98–111)
Chloride: 95 mmol/L — ABNORMAL LOW (ref 98–111)
Creatinine, Ser: 1.35 mg/dL — ABNORMAL HIGH (ref 0.61–1.24)
Creatinine, Ser: 1.37 mg/dL — ABNORMAL HIGH (ref 0.61–1.24)
GFR, Estimated: 55 mL/min — ABNORMAL LOW (ref >60)
GFR, Estimated: 54 mL/min — ABNORMAL LOW (ref >60)
Glucose, Bld: 100 mg/dL — ABNORMAL HIGH (ref 70–99)
Glucose, Bld: 111 mg/dL — ABNORMAL HIGH (ref 70–99)
Potassium: 4.4 mmol/L (ref 3.5–5.1)
Potassium: 4.5 mmol/L (ref 3.5–5.1)
Sodium: 131 mmol/L — ABNORMAL LOW (ref 135–145)
Sodium: 131 mmol/L — ABNORMAL LOW (ref 135–145)

## 2021-07-29 LAB — CBC
HCT: 26 % — ABNORMAL LOW (ref 39.0–52.0)
HCT: 27.2 % — ABNORMAL LOW (ref 39.0–52.0)
Hemoglobin: 8.5 g/dL — ABNORMAL LOW (ref 13.0–17.0)
Hemoglobin: 8.9 g/dL — ABNORMAL LOW (ref 13.0–17.0)
MCH: 30.1 pg (ref 26.0–34.0)
MCH: 30.3 pg (ref 26.0–34.0)
MCHC: 32.7 g/dL (ref 30.0–36.0)
MCHC: 32.7 g/dL (ref 30.0–36.0)
MCV: 92.2 fL (ref 80.0–100.0)
MCV: 92.5 fL (ref 80.0–100.0)
Platelets: 340 10*3/uL (ref 150–400)
Platelets: 380 10*3/uL (ref 150–400)
RBC: 2.82 MIL/uL — ABNORMAL LOW (ref 4.22–5.81)
RBC: 2.94 MIL/uL — ABNORMAL LOW (ref 4.22–5.81)
RDW: 13.2 % (ref 11.5–15.5)
RDW: 13.4 % (ref 11.5–15.5)
WBC: 16.6 10*3/uL — ABNORMAL HIGH (ref 4.0–10.5)
WBC: 17.5 10*3/uL — ABNORMAL HIGH (ref 4.0–10.5)
nRBC: 0.2 % (ref 0.0–0.2)
nRBC: 0.1 % (ref 0.0–0.2)

## 2021-07-29 MED ORDER — MAGNESIUM HYDROXIDE 400 MG/5ML PO SUSP: 30.0000 mL | Freq: Every day | ORAL | Status: DC | PRN

## 2021-07-29 MED ORDER — SODIUM CHLORIDE 0.9% FLUSH: 3.0000 mL | INTRAVENOUS | Status: DC | PRN

## 2021-07-29 MED ORDER — SODIUM CHLORIDE 0.9% FLUSH
3.0000 mL | Freq: Two times a day (BID) | INTRAVENOUS | Status: DC
  Administered 2021-07-29 – 2021-07-31 (×5): 3 mL via INTRAVENOUS

## 2021-07-29 MED ORDER — CHLORHEXIDINE GLUCONATE CLOTH 2 % EX PADS
6.0000 | MEDICATED_PAD | Freq: Every day | CUTANEOUS | Status: DC
  Administered 2021-07-31: 6 via TOPICAL

## 2021-07-29 MED ORDER — SODIUM CHLORIDE 0.9 % IV SOLN: 250.0000 mL | INTRAVENOUS | Status: DC | PRN

## 2021-07-29 MED ORDER — ~~LOC~~ CARDIAC SURGERY, PATIENT & FAMILY EDUCATION: Freq: Once | Status: AC

## 2021-07-29 MED ORDER — ALUM & MAG HYDROXIDE-SIMETH 200-200-20 MG/5ML PO SUSP: 15.0000 mL | Freq: Four times a day (QID) | ORAL | Status: DC | PRN

## 2021-07-29 NOTE — Progress Notes (Addendum)
      [address]       [place] [address]             [phone]      7 Days Post-Op Procedure(s) (LRB): 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, ON PUMP, USING LEFT INTERNAL MAMMARY ARTERY AND ENDOSCOPICALLY HARVESTED RIGHT GREATER SAPHENOUS VEIN (N/A) TRANSESOPHAGEAL ECHOCARDIOGRAM (TEE) (N/A) APPLICATION OF CELL SAVER ENDOVEIN HARVEST OF GREATER SAPHENOUS VEIN Subjective: Awake and alert, up in the bedside chair. No appetite for the breakfast he was served.  Had a transient episode of pain in his right thigh last night that resolved after getting out of bed.  Says he is voiding without difficulty since foley catheter removal.  Objective: Vital signs in last 24 hours: Temp:  [97.9 F (36.6 C)-98.6 F (37 C)] 97.9 F (36.6 C) (10/25 0741) Pulse Rate:  [82-123] 115 (10/25 0500) Cardiac Rhythm: Atrial fibrillation (10/25 0600) Resp:  [9-33] 15 (10/25 0500) BP: (82-135)/(55-75) 100/66 (10/25 0500) SpO2:  [87 %-98 %] 94 % (10/25 0500) Weight:  [84.7 kg] 84.7 kg (10/25 0500)    Intake/Output from previous day: 10/24 0701 - 10/25 0700 In: 480 [P.O.:480] Out: 1995 [UMPNT:6144] Intake/Output this shift: No intake/output data recorded.  General appearance: alert, cooperative, and no distress Neurologic: intact Heart: irregularly irregular rhythm, HR around 100/min Lungs: clear anterior, diminished breath sounds bibasilar Abdomen:  soft, non-tender Wound: clean and dry, pacer wires are out.   Lab Results: Recent Labs    07/28/21 0133 07/29/21 0205  WBC 13.6* 16.6*  HGB 8.2* 8.5*  HCT 24.6* 26.0*  PLT 278 340    BMET:  Recent Labs    07/28/21 0133 07/29/21 0205  NA 131* 131*  K 4.2 4.4  CL 98 97*  CO2 25 27  GLUCOSE 106* 100*  BUN 24* 21  CREATININE 1.18 1.35*  CALCIUM 8.2* 8.5*     PT/INR: No results for input(s): LABPROT, INR in the last 72 hours. ABG    Component Value Date/Time   PHART 7.358 07/22/2021 1840   HCO3 20.8  07/22/2021 1840   TCO2 22 07/22/2021 1840   ACIDBASEDEF 4.0 (H) 07/22/2021 1840   O2SAT 96.0 07/22/2021 1840   CBG (last 3)  Recent Labs    07/28/21 0423 07/28/21 0649 07/28/21 1110  GLUCAP 103* 102* 113*     Assessment/Plan: S/P Procedure(s) (LRB): 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, ON PUMP, USING LEFT INTERNAL MAMMARY ARTERY AND ENDOSCOPICALLY HARVESTED RIGHT GREATER SAPHENOUS VEIN (N/A) TRANSESOPHAGEAL ECHOCARDIOGRAM (TEE) (N/A) APPLICATION OF CELL SAVER ENDOVEIN HARVEST OF GREATER SAPHENOUS VEIN  POD # 7  CV- remains in atrial fib with reasonable rate control- continue lopressor, propafenone. On Pradaxa  RESP- continue IS  RENAL- weight 8kg positive. Creat up slightly.   Continue torsemide, recheck creat in AM.  HEME- expected acute blood loss anemia- Hct trending up. On Fe++ supplement. Leukocytosis without fever or signs of infection. Monitor.   GI- tolerating PO's, no BM yet.   Disposition- Transfer to progressive care   LOS: 9 days    Antony Odea, PA-C [phone] 07/29/2021  Patient seen and examined, agree with above I'm not sure what to make of left thigh pain, "100s of bees stinging me." No motor deficit and pain resolved. Will monitor for any additional issues. Ileus improved but still no BM yet. Awaiting progressive bed  Remo Lipps C. Roxan Hockey, MD [facility] [phone]

## 2021-07-29 NOTE — Progress Notes (Signed)
      [address]       [place] [address]             [phone]     POD # 7 CABG  BP 99/73   Pulse 95   Temp 98.9 F (37.2 C) (Oral)   Resp 15   Ht 5\' 4"  (1.626 m)   Wt 84.7 kg   SpO2 96%   BMI 32.05 kg/m  BP low this AM   Intake/Output Summary (Last 24 hours) at 07/29/2021 1759 Last data filed at 07/29/2021 1500 Gross per 24 hour  Intake 720 ml  Output 1395 ml  Net -675 ml   Still in A fib- on Pradaxa  For transfer to 4E this evening  Remo Lipps C. Roxan Hockey, MD [facility] [phone]

## 2021-07-29 NOTE — Progress Notes (Signed)
1640- Attempt to call report made.

## 2021-07-29 NOTE — Progress Notes (Signed)
CARDIAC REHAB PHASE I   PRE:  Rate/Rhythm: 100 Afib  BP:  Sitting: 134/66      SaO2: 96 RA  MODE:  Ambulation: 270 ft   POST:  Rate/Rhythm: 123 Afib  BP:  Sitting: 132/69    SaO2: 96 RA   Pt ambulated 228ft in hallway assist of one with EVA and gait belt. Pt able to increase distance. Pt returned to recliner. Encouraged continued IS use and ambulation. Will continue to follow.  [phone] Rufina Falco, RN BSN 07/29/2021 11:05 AM

## 2021-07-30 LAB — BASIC METABOLIC PANEL
Anion gap: 10 (ref 5–15)
BUN: 24 mg/dL — ABNORMAL HIGH (ref 8–23)
CO2: 26 mmol/L (ref 22–32)
Calcium: 8.3 mg/dL — ABNORMAL LOW (ref 8.9–10.3)
Chloride: 97 mmol/L — ABNORMAL LOW (ref 98–111)
Creatinine, Ser: 1.4 mg/dL — ABNORMAL HIGH (ref 0.61–1.24)
GFR, Estimated: 53 mL/min — ABNORMAL LOW (ref >60)
Glucose, Bld: 98 mg/dL (ref 70–99)
Potassium: 4.2 mmol/L (ref 3.5–5.1)
Sodium: 133 mmol/L — ABNORMAL LOW (ref 135–145)

## 2021-07-30 MED ORDER — BACLOFEN 5 MG HALF TABLET
5.0000 mg | ORAL_TABLET | Freq: Three times a day (TID) | ORAL | Status: DC
  Administered 2021-07-30 – 2021-08-01 (×6): 5 mg via ORAL
  Filled 2021-07-30 (×6): qty 1

## 2021-07-30 MED ORDER — OXYCODONE HCL 5 MG PO TABS
5.0000 mg | ORAL_TABLET | ORAL | Status: DC | PRN
  Administered 2021-07-31: 5 mg via ORAL
  Filled 2021-07-30: qty 1

## 2021-07-30 NOTE — Plan of Care (Addendum)
  Problem: Education: Goal: Knowledge of General Education information will improve Description: Including pain rating scale, medication(s)/side effects and non-pharmacologic comfort measures Outcome: Progressing   Problem: Clinical Measurements: Goal: Will remain free from infection Outcome: Progressing   Problem: Clinical Measurements: Goal: Ability to maintain clinical measurements within normal limits will improve Outcome: Progressing   Problem: Clinical Measurements: Goal: Diagnostic test results will improve Outcome: Progressing   Problem: Clinical Measurements: Goal: Respiratory complications will improve Outcome: Progressing   Problem: Activity: Goal: Risk for activity intolerance will decrease Outcome: Progressing   Problem: Clinical Measurements: Goal: Cardiovascular complication will be avoided Outcome: Progressing: Atrial fib under rated controlled. HR 90s, BP stable.   Problem: Nutrition: Goal: Adequate nutrition will be maintained Outcome: Progressing   Problem: Coping: Goal: Level of anxiety will decrease Outcome: Progressing   Problem: Elimination: Goal: Will not experience complications related to bowel motility Outcome: Progressing   Problem: Pain Managment: Goal: General experience of comfort will improve Outcome: Progressing   Problem: Safety: Goal: Ability to remain free from injury will improve Outcome: Progressing   Problem: Skin Integrity: Goal: Risk for impaired skin integrity will decrease Outcome: Progressing   Kennyth Lose, RN

## 2021-07-30 NOTE — Care Management Important Message (Signed)
Important Message  Patient Details  Name: Ryan [name] MRN: [id number] Date of Birth: [birth year]   Medicare Important Message Given:  Yes     Orbie Pyo 07/30/2021, 2:04 PM

## 2021-07-30 NOTE — Progress Notes (Signed)
      [address]       [place] [address]             [phone]      8 Days Post-Op Procedure(s) (LRB): 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, ON PUMP, USING LEFT INTERNAL MAMMARY ARTERY AND ENDOSCOPICALLY HARVESTED RIGHT GREATER SAPHENOUS VEIN (N/A) TRANSESOPHAGEAL ECHOCARDIOGRAM (TEE) (N/A) APPLICATION OF CELL SAVER ENDOVEIN HARVEST OF GREATER SAPHENOUS VEIN Subjective: Awake and alert, up in the bedside chair.  Transferred to 4E last evening. Progressing slowly with ambulation.  Voiding OK. BM before leaving ICU.  No new concerns.   Objective: Vital signs in last 24 hours: Temp:  [97.7 F (36.5 C)-99.1 F (37.3 C)] 98.4 F (36.9 C) (10/26 0731) Pulse Rate:  [84-130] 93 (10/26 0731) Cardiac Rhythm: Atrial fibrillation (10/25 2315) Resp:  [8-29] 19 (10/26 0731) BP: (83-134)/(50-78) 115/76 (10/26 0731) SpO2:  [89 %-100 %] 98 % (10/26 0731) Weight:  [79.1 kg] 79.1 kg (10/26 0543)    Intake/Output from previous day: 10/25 0701 - 10/26 0700 In: 960 [P.O.:960] Out: 1375 [Urine:1375] Intake/Output this shift: Total I/O In: -  Out: 400 [Urine:400]  General appearance: alert, cooperative, and no distress Neurologic: intact Heart: irregularly irregular rhythm, HR low 100's/min Lungs: CTA bilat Abdomen:  soft, non-tender Wound: clean and dry, pacer wires are out.   Lab Results: Recent Labs    07/29/21 0205 07/29/21 1745  WBC 16.6* 17.5*  HGB 8.5* 8.9*  HCT 26.0* 27.2*  PLT 340 380    BMET:  Recent Labs    07/29/21 1745 07/30/21 0205  NA 131* 133*  K 4.5 4.2  CL 95* 97*  CO2 26 26  GLUCOSE 111* 98  BUN 23 24*  CREATININE 1.37* 1.40*  CALCIUM 8.5* 8.3*     PT/INR: No results for input(s): LABPROT, INR in the last 72 hours. ABG    Component Value Date/Time   PHART 7.358 07/22/2021 1840   HCO3 20.8 07/22/2021 1840   TCO2 22 07/22/2021 1840   ACIDBASEDEF 4.0 (H) 07/22/2021 1840   O2SAT 96.0 07/22/2021 1840   CBG  (last 3)  Recent Labs    07/28/21 0423 07/28/21 0649 07/28/21 1110  GLUCAP 103* 102* 113*     Assessment/Plan: S/P Procedure(s) (LRB): 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, ON PUMP, USING LEFT INTERNAL MAMMARY ARTERY AND ENDOSCOPICALLY HARVESTED RIGHT GREATER SAPHENOUS VEIN (N/A) TRANSESOPHAGEAL ECHOCARDIOGRAM (TEE) (N/A) APPLICATION OF CELL SAVER ENDOVEIN HARVEST OF GREATER SAPHENOUS VEIN  POD # 8 CV- remains in atrial fib with reasonable rate control- continue lopressor, propafenone. On Pradaxa  RESP- continue IS  RENAL- weight near pre-op level. Creat continues to trend up slowly.   Will d/c torsemide, recheck creat in AM.  HEME- expected acute blood loss anemia- Hct trending up. On Fe++ supplement. Leukocytosis without fever or signs of infection. No productive cough, no dysuria, incisions OK. Repeat lab and CXR in AM.  GI- tolerating PO's, BM before leaving ICU.  Disposition- Progressing slowly with ambulation.  He says he has family to help him at home and prefers to eventually discharge to home. Will ask PT to evaluate for [facility] needs.     LOS: 10 days   Antony Odea, [place] [phone] 07/30/2021

## 2021-07-30 NOTE — Evaluation (Signed)
Physical Therapy Evaluation Patient Details Name: Ryan [name] MRN: [id number] DOB: 1946-08-05 Today's Date: 07/30/2021  History of Present Illness  Pt is a 75 y.o male admitted 07/20/21 with bladder pain, weakness,  dizziness (pt s/p recent TURP on 10/13 with d/c home 10/14). 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 PMH includes CAD, COPD, CKD, HTN, osteoporosis, BPH.   Clinical Impression  Pt presents with an overall decrease in functional mobility secondary to above. PTA, pt independent, enjoys staying active, lives with wife; has multiple supportive family members available at d/c. Educ on sternal precautions, activity recommendations and importance of mobility. Today, pt able to perform transfer and gait training with RW and supervision for safety; pt with good awareness of maintaining sternal precautions. Pt would benefit from continued acute PT services to maximize functional mobility and independence prior to d/c with HHPT services.    Recommendations for follow up therapy are one component of a multi-disciplinary discharge planning process, led by the attending physician.  Recommendations may be updated based on patient status, additional functional criteria and insurance authorization.  Follow Up Recommendations Home health PT    Assistance Recommended at Discharge PRN  Functional Status Assessment Patient has had a recent decline in their functional status and demonstrates the ability to make significant improvements in function in a reasonable and predictable amount of time.  Equipment Recommendations  None recommended by PT    Recommendations for Other Services       Precautions / Restrictions Precautions Precautions: Fall;Sternal Precaution Comments: Pt with good awareness of maintaining sternal precautions      Mobility  Bed Mobility               General bed mobility comments: received sitting in recliner    Transfers Overall transfer  level: Needs assistance Equipment used: Rolling walker (2 wheels) Transfers: Sit to/from Stand Sit to Stand: Supervision           General transfer comment: Good carryover of correct hand placement (on knees to maintain sternal precautions) from session with mobility specialist; supervision for safety    Ambulation/Gait Ambulation/Gait assistance: Supervision Gait Distance (Feet): 180 Feet Assistive device: Rolling walker (2 wheels) Gait Pattern/deviations: Trunk flexed;Decreased stride length;Step-through pattern Gait velocity: Decreased   General Gait Details: Slow, steady gait with RW, supervision for safety; pt declined further distance secondary to fatigue  Stairs            Wheelchair Mobility    Modified Rankin (Stroke Patients Only)       Balance Overall balance assessment: Needs assistance Sitting-balance support: Feet supported;No upper extremity supported Sitting balance-Leahy Scale: Good     Standing balance support: Bilateral upper extremity supported;No upper extremity supported;During functional activity Standing balance-Leahy Scale: Fair Standing balance comment: able to static stand UE support; static and dynamic stability improved with walker                             Pertinent Vitals/Pain Pain Assessment: No/denies pain Pain Score: 0-No pain Pain Intervention(s): Monitored during session    [place] expects to be discharged to:: Private residence Living Arrangements: Spouse/significant other Available Help at Discharge: Family;Neighbor;Available 24 hours/day Type of Home: House Home Access: Stairs to enter Entrance Stairs-Rails: Right Entrance Stairs-Number of Steps: 3 Alternate Level Stairs-Number of Steps: flight Home Layout: Two level Home Equipment: Conservation officer, nature (2 wheels);Standard Walker;Rollator (4 wheels);Cane - quad;Cane - single point;Shower seat -  built in Additional Comments: lives with wife  who can assist as needed.  Sister in law will also be able to assist as needed     Prior Function Prior Level of Function : Independent/Modified Independent             Mobility Comments: Independent without DME but uses RW at night for trips to bathroom       Hand Dominance   Dominant Hand: Right    Extremity/Trunk Assessment   Upper Extremity Assessment Upper Extremity Assessment: Overall WFL for tasks assessed (within confines of sternal precautions)    Lower Extremity Assessment Lower Extremity Assessment: Generalized weakness       Communication   Communication: No difficulties  Cognition Arousal/Alertness: Awake/alert Behavior During Therapy: WFL for tasks assessed/performed Overall Cognitive Status: Within Functional Limits for tasks assessed                                          General Comments General comments (skin integrity, edema, etc.): sp02 97% on room air; HR 90s-110s    Exercises     Assessment/Plan    PT Assessment Patient needs continued PT services  PT Problem List Decreased activity tolerance;Decreased strength;Decreased knowledge of precautions;Cardiopulmonary status limiting activity;Decreased balance;Decreased mobility       PT Treatment Interventions Therapeutic exercise;Patient/family education;Stair training;DME instruction;Gait training;Functional mobility training;Therapeutic activities;Balance training    PT Goals (Current goals can be found in the Care Plan section)  Acute Rehab PT Goals Patient Stated Goal: Go home and return to exercise PT Goal Formulation: With patient Time For Goal Achievement: 08/13/21 Potential to Achieve Goals: Good    Frequency Min 3X/week   Barriers to discharge        Co-evaluation               AM-PAC PT "6 Clicks" Mobility  Outcome Measure Help needed turning from your back to your side while in a flat bed without using bedrails?: A Little Help needed moving from  lying on your back to sitting on the side of a flat bed without using bedrails?: A Little Help needed moving to and from a bed to a chair (including a wheelchair)?: A Little Help needed standing up from a chair using your arms (e.g., wheelchair or bedside chair)?: A Little Help needed to walk in hospital room?: A Little Help needed climbing 3-5 steps with a railing? : A Little 6 Click Score: 18    End of Session Equipment Utilized During Treatment: Gait belt Activity Tolerance: Patient tolerated treatment well Patient left: in chair;with chair alarm set;with call bell/phone within reach;Other (comment) (with cardiac rehab) Nurse Communication: Mobility status PT Visit Diagnosis: Muscle weakness (generalized) (M62.81);Other abnormalities of gait and mobility (R26.89)    Time: 3664-4034 PT Time Calculation (min) (ACUTE ONLY): 33 min   Charges:   PT Evaluation $PT Eval Moderate Complexity: 1 Mod PT Treatments $Therapeutic Activity: 8-22 mins   Mabeline Caras, PT, DPT Acute Rehabilitation Services  Pager [phone] Office [place] 07/30/2021, 4:48 PM

## 2021-07-30 NOTE — Progress Notes (Signed)
CARDIAC REHAB PHASE I   Went to offer to walk with pt, pt up walking with PT. Pt requesting gait belt for home, one at bedside. Encouraged continued ambulation and IS use. Pt denies DME needs. Will continue to follow.  [id number] Rufina Falco, RN BSN 07/30/2021 3:01 PM

## 2021-07-30 NOTE — Progress Notes (Signed)
Mobility Specialist Progress Note:   07/30/21 1739  Mobility  Activity Ambulated in hall  Level of Assistance Standby assist, set-up cues, supervision of patient - no hands on  Assistive Device Front wheel walker  Distance Ambulated (ft) 200 ft  Mobility Ambulated with assistance in hallway  Mobility Response Tolerated well  Mobility performed by Mobility specialist  $Mobility charge 1 Mobility   No complaints of pain. Pt left in bed with call bell in reach and all needs met.    [facility] Health and safety inspector Phone [phone]

## 2021-07-30 NOTE — Progress Notes (Signed)
Mobility Specialist: Progress Note   07/30/21 1149  Mobility  Activity Ambulated in hall  Level of Assistance Modified independent, requires aide device or extra time  Assistive Device Front wheel walker  Distance Ambulated (ft) 240 ft  Mobility Ambulated with assistance in hallway  Mobility Response Tolerated well  Mobility performed by Mobility specialist  Bed Position Chair  $Mobility charge 1 Mobility   Pre-Mobility: 102 HR During Mobility: 110 HR Post-Mobility: 90 HR, 99% SpO2  Pt required minA to sit EOB from supine and was independent to stand. Pt stopped x2 for brief standing breaks d/t fatigue, otherwise asymptomatic. Pt to recliner after walk with call bell in his lap and family present in the room.   [facility] Lindyn Vossler Mobility Specialist Mobility Specialist Phone: [phone]

## 2021-07-31 ENCOUNTER — Inpatient Hospital Stay (HOSPITAL_COMMUNITY): Payer: Medicare Other

## 2021-07-31 LAB — URINALYSIS, COMPLETE (UACMP) WITH MICROSCOPIC
Bilirubin Urine: NEGATIVE
Glucose, UA: NEGATIVE mg/dL
Ketones, ur: NEGATIVE mg/dL
Leukocytes,Ua: NEGATIVE
Nitrite: NEGATIVE
Protein, ur: 30 mg/dL — AB
RBC / HPF: >50 RBC/hpf — ABNORMAL HIGH (ref 0–5)
Specific Gravity, Urine: 1.017 (ref 1.005–1.030)
pH: 8 (ref 5.0–8.0)

## 2021-07-31 LAB — CBC
HCT: 24.8 % — ABNORMAL LOW (ref 39.0–52.0)
Hemoglobin: 8 g/dL — ABNORMAL LOW (ref 13.0–17.0)
MCH: 29.9 pg (ref 26.0–34.0)
MCHC: 32.3 g/dL (ref 30.0–36.0)
MCV: 92.5 fL (ref 80.0–100.0)
Platelets: 431 10*3/uL — ABNORMAL HIGH (ref 150–400)
RBC: 2.68 MIL/uL — ABNORMAL LOW (ref 4.22–5.81)
RDW: 13.4 % (ref 11.5–15.5)
WBC: 16.6 10*3/uL — ABNORMAL HIGH (ref 4.0–10.5)
nRBC: 0 % (ref 0.0–0.2)

## 2021-07-31 LAB — BASIC METABOLIC PANEL
Anion gap: 7 (ref 5–15)
BUN: 22 mg/dL (ref 8–23)
CO2: 28 mmol/L (ref 22–32)
Calcium: 8.4 mg/dL — ABNORMAL LOW (ref 8.9–10.3)
Chloride: 99 mmol/L (ref 98–111)
Creatinine, Ser: 1.59 mg/dL — ABNORMAL HIGH (ref 0.61–1.24)
GFR, Estimated: 45 mL/min — ABNORMAL LOW (ref >60)
Glucose, Bld: 97 mg/dL (ref 70–99)
Potassium: 4.1 mmol/L (ref 3.5–5.1)
Sodium: 134 mmol/L — ABNORMAL LOW (ref 135–145)

## 2021-07-31 MED ORDER — POLYETHYLENE GLYCOL 3350 17 G PO PACK
17.0000 g | PACK | Freq: Every day | ORAL | Status: DC
  Filled 2021-07-31 (×2): qty 1

## 2021-07-31 NOTE — Progress Notes (Signed)
Physical Therapy Treatment and Discharge  Patient Details Name: Ryan [name] MRN: [id number] DOB: 18-Oct-1945 Today's Date: 07/31/2021   History of Present Illness Pt is a 75 y.o male admitted 07/20/21 with bladder pain, weakness,  dizziness (pt s/p recent TURP on 10/13 with d/c home 10/14). 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 PMH includes CAD, COPD, CKD, HTN, osteoporosis, BPH.    PT Comments    Treatment today focused on transfer, gait, and stair training in preparation for d/c home. Pt moving well with rollator and supervision for safety; good awareness of maintaining sternal precautions. Pt has met short-term acute PT goals. Reviewed education, pt reports no further questions or concerns. Will d/c acute PT.    Recommendations for follow up therapy are one component of a multi-disciplinary discharge planning process, led by the attending physician.  Recommendations may be updated based on patient status, additional functional criteria and insurance authorization.  Follow Up Recommendations  Home health PT     Assistance Recommended at Discharge PRN  Equipment Recommendations  Rollator (4 wheels)    Recommendations for Other Services       Precautions / Restrictions Precautions Precautions: Fall;Sternal (Simultaneous filing. User may not have seen previous data.) Precaution Comments: Pt with good awareness of maintaining sternal precautions     Mobility  Bed Mobility Overal bed mobility: Needs Assistance Bed Mobility: Rolling;Sidelying to Sit Rolling: Supervision Sidelying to sit: Min guard       General bed mobility comments: cues for hand placement    Transfers Overall transfer level: Needs assistance Equipment used: Rollator (4 wheels) Transfers: Sit to/from Stand Sit to Stand: Supervision           General transfer comment: Good carryover of correct hand placement (on knees to maintain sternal precautions), supervision for safety     Ambulation/Gait Ambulation/Gait assistance: Supervision Gait Distance (Feet): 295 Feet Assistive device: Rollator (4 wheels) Gait Pattern/deviations: Trunk flexed;Decreased stride length;Step-through pattern Gait velocity: Decreased   General Gait Details: needed 3 seated rest breaks secondary to fatigue   Stairs Stairs: Yes Stairs assistance: Min guard Stair Management: One rail Left;Step to pattern;Forwards Number of Stairs: 5 General stair comments: min gaurd for safety; HR 113 after stairs   Wheelchair Mobility    Modified Rankin (Stroke Patients Only)       Balance Overall balance assessment: Needs assistance Sitting-balance support: Feet supported;No upper extremity supported Sitting balance-Leahy Scale: Good     Standing balance support: Bilateral upper extremity supported;No upper extremity supported;During functional activity Standing balance-Leahy Scale: Fair Standing balance comment: able to static stand UE support; static and dynamic stability improved with rollator                            Cognition Arousal/Alertness: Awake/alert Behavior During Therapy: WFL for tasks assessed/performed Overall Cognitive Status: Within Functional Limits for tasks assessed                                          Exercises      General Comments General comments (skin integrity, edema, etc.): Pre-therapy: HR 99, BP 128/73, SpO2 95% on room air; Post-therapy: HR 104, BP 128/73, SpO2 on room air 97%      Pertinent Vitals/Pain Pain Assessment: No/denies pain    Home Living  Prior Function            PT Goals (current goals can now be found in the care plan section) Acute Rehab PT Goals Patient Stated Goal: Go home and return to exercise PT Goal Formulation: With patient Time For Goal Achievement: 08/13/21 Potential to Achieve Goals: Good Progress towards PT goals: Goals met/education  completed, patient discharged from PT    Frequency    Min 3X/week      PT Plan      Co-evaluation              AM-PAC PT "6 Clicks" Mobility   Outcome Measure  Help needed turning from your back to your side while in a flat bed without using bedrails?: A Little Help needed moving from lying on your back to sitting on the side of a flat bed without using bedrails?: A Little Help needed moving to and from a bed to a chair (including a wheelchair)?: A Little Help needed standing up from a chair using your arms (e.g., wheelchair or bedside chair)?: A Little Help needed to walk in hospital room?: A Little Help needed climbing 3-5 steps with a railing? : A Little 6 Click Score: 18    End of Session Equipment Utilized During Treatment: Gait belt Activity Tolerance: Patient tolerated treatment well Patient left: in chair;with chair alarm set;with call bell/phone within reach;Other (comment) Nurse Communication: Mobility status PT Visit Diagnosis: Muscle weakness (generalized) (M62.81);Other abnormalities of gait and mobility (R26.89)     Time: 0827-0906 PT Time Calculation (min) (ACUTE ONLY): 39 min  Charges:  $Gait Training: 8-22 mins $Therapeutic Activity: 23-37 mins                     Brandon Melnick, SPT    Brandon Melnick 07/31/2021, 9:53 AM

## 2021-07-31 NOTE — Progress Notes (Signed)
CARDIAC REHAB PHASE I   Offered to walk with pt. Pt recently ambulated with PT and practiced stairs. States he prefers using a rollator to walk, has one available at home. Hopeful to d/c tomorrow. Will f/u later to continue to encourage ambulation.  [id number] Rufina Falco, RN BSN 07/31/2021 9:44 AM

## 2021-07-31 NOTE — Progress Notes (Addendum)
Mobility Specialist Progress Note:   07/31/21 1700  Therapy Vitals  Pulse Rate (!) 101  Mobility  Activity Ambulated in hall  Level of Assistance Standby assist, set-up cues, supervision of patient - no hands on  Assistive Device Four wheel walker  Distance Ambulated (ft) 260 ft  Mobility Ambulated with assistance in hallway  Mobility Response Tolerated well  Mobility performed by Mobility specialist  $Mobility charge 1 Mobility   Pre Mobility: HR 101 bpm During Mobility: HR 127 bpm Post Mobility: HR 100 bpm  Pt required x1 seated break d/t SOB. Pt left in bed with bed alarm on.  Nelta Numbers Mobility Specialist  Phone [phone]

## 2021-07-31 NOTE — Progress Notes (Signed)
Post void bladder scan volume 139 Edwena Blow, RN

## 2021-07-31 NOTE — Progress Notes (Signed)
Mobility Specialist: Progress Note   07/31/21 1302  Mobility  Activity Ambulated in hall  Level of Assistance Modified independent, requires aide device or extra time  Assistive Device Four wheel walker  Distance Ambulated (ft) 280 ft (140'x2)  Mobility Ambulated with assistance in hallway  Mobility Response Tolerated well  Mobility performed by Mobility specialist  $Mobility charge 1 Mobility   Post-Mobility: 109 HR  Pt stopped x1 for a seated break d/t fatigue, otherwise asymptomatic. Pt back to bed after walk with call bell and phone in reach with family present in the room.   [facility] Eon Zunker Mobility Specialist Mobility Specialist Phone: [phone]

## 2021-07-31 NOTE — Progress Notes (Addendum)
[address]       [place] [address]             [phone]      9 Days Post-Op Procedure(s) (LRB): 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, ON PUMP, USING LEFT INTERNAL MAMMARY ARTERY AND ENDOSCOPICALLY HARVESTED RIGHT GREATER SAPHENOUS VEIN (N/A) TRANSESOPHAGEAL ECHOCARDIOGRAM (TEE) (N/A) APPLICATION OF CELL SAVER ENDOVEIN HARVEST OF GREATER SAPHENOUS VEIN Subjective: Resting in bed, awake and alert.  C/O constipation, had a a BM before leaving ICU but none in a few days.   Objective: Vital signs in last 24 hours: Temp:  [97.7 F (36.5 C)-98.7 F (37.1 C)] 98.7 F (37.1 C) (10/27 0352) Pulse Rate:  [78-100] 78 (10/27 0352) Cardiac Rhythm: Atrial fibrillation;Bundle branch block (10/26 2014) Resp:  [17-20] 20 (10/27 0352) BP: (95-136)/(53-76) 121/69 (10/27 0352) SpO2:  [94 %-97 %] 95 % (10/27 0352) Weight:  [80.2 kg] 80.2 kg (10/27 0352)    Intake/Output from previous day: 10/26 0701 - 10/27 0700 In: -  Out: 1950 [Urine:1950] Intake/Output this shift: No intake/output data recorded.  General appearance: alert, cooperative, and no distress Neurologic: intact Heart: irregularly irregular rhythm, HR low 100's/min Lungs: CTA bilat Abdomen:  soft, non-tender Wound: clean and dry, pacer wires are out.   Lab Results: Recent Labs    07/29/21 1745 07/31/21 0050  WBC 17.5* 16.6*  HGB 8.9* 8.0*  HCT 27.2* 24.8*  PLT 380 431*    BMET:  Recent Labs    07/30/21 0205 07/31/21 0050  NA 133* 134*  K 4.2 4.1  CL 97* 99  CO2 26 28  GLUCOSE 98 97  BUN 24* 22  CREATININE 1.40* 1.59*  CALCIUM 8.3* 8.4*     PT/INR: No results for input(s): LABPROT, INR in the last 72 hours. ABG    Component Value Date/Time   PHART 7.358 07/22/2021 1840   HCO3 20.8 07/22/2021 1840   TCO2 22 07/22/2021 1840   ACIDBASEDEF 4.0 (H) 07/22/2021 1840   O2SAT 96.0 07/22/2021 1840   CBG (last 3)  Recent Labs    07/28/21 1110  GLUCAP 113*      Assessment/Plan: S/P Procedure(s) (LRB): 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, ON PUMP, USING LEFT INTERNAL MAMMARY ARTERY AND ENDOSCOPICALLY HARVESTED RIGHT GREATER SAPHENOUS VEIN (N/A) TRANSESOPHAGEAL ECHOCARDIOGRAM (TEE) (N/A) APPLICATION OF CELL SAVER ENDOVEIN HARVEST OF GREATER SAPHENOUS VEIN  POD # 9 CV- remains in atrial fib with acceptable rate control- continue lopressor, propafenone. On Pradaxa  RESP- continue IS  RENAL- weight near pre-op level. Creat continues to trend up slowly.   Stopped torsemide yesterday. Encourage PO fluids, recheck creat in AM.  HEME- expected acute blood loss anemia- Hct trending up. On Fe++ supplement. Leukocytosis without fever or signs of infection, WBC trenging down. No productive cough, no dysuria, incisions hav no signs of infection. CXR OK.  Repeat lab in AM.  GI- tolerating PO's, no BM in a few days. Miralax ordered  Disposition- Progressing slowly with ambulation.  He says he has family to help him at home and prefers to eventually discharge to home. Evaluated by PT and HH PT recommended.  He is progressing toward being ready for discharge but would like to see renal function improving before leaving the hospital.    LOS: 11 days   Antony Odea, PA-C [phone] 07/31/2021  Patient seen and examined, agree with above Looks great, wants to go home soon Creatinine up slightly, minimal PVR. Check UA  Possibly home tomorrow depending on renal function  Remo Lipps C. Roxan Hockey, MD [facility] [phone]

## 2021-08-01 LAB — BASIC METABOLIC PANEL
Anion gap: 10 (ref 5–15)
BUN: 19 mg/dL (ref 8–23)
CO2: 22 mmol/L (ref 22–32)
Calcium: 8 mg/dL — ABNORMAL LOW (ref 8.9–10.3)
Chloride: 101 mmol/L (ref 98–111)
Creatinine, Ser: 1.37 mg/dL — ABNORMAL HIGH (ref 0.61–1.24)
GFR, Estimated: 54 mL/min — ABNORMAL LOW (ref >60)
Glucose, Bld: 81 mg/dL (ref 70–99)
Potassium: 4 mmol/L (ref 3.5–5.1)
Sodium: 133 mmol/L — ABNORMAL LOW (ref 135–145)

## 2021-08-01 MED ORDER — ASPIRIN 81 MG PO TBEC: 81.0000 mg | DELAYED_RELEASE_TABLET | Freq: Every day | ORAL | 11 refills | Status: AC

## 2021-08-01 MED ORDER — BACLOFEN 5 MG PO TABS: 5.0000 mg | ORAL_TABLET | Freq: Three times a day (TID) | ORAL | 1 refills | Status: DC | PRN

## 2021-08-01 MED ORDER — OXYCODONE HCL 5 MG PO TABS: 5.0000 mg | ORAL_TABLET | ORAL | 0 refills | Status: DC | PRN

## 2021-08-01 MED ORDER — OXYBUTYNIN CHLORIDE 5 MG PO TABS: 5.0000 mg | ORAL_TABLET | Freq: Three times a day (TID) | ORAL | 0 refills | Status: AC | PRN

## 2021-08-01 NOTE — Progress Notes (Signed)
CARDIAC REHAB PHASE I   D/c education completed with pt and family. Pt educated on importance of site care and monitoring incisions daily. Encouraged continued IS use, walks, and sternal precautions. Pt given in-the-tube sheet along with heart healthy diet. Reviewed restrictions and exercise guidelines. Will refer to CRP II [place].   [id number] Rufina Falco, RN BSN 08/01/2021 10:29 AM

## 2021-08-01 NOTE — Discharge Instructions (Signed)

## 2021-08-01 NOTE — Progress Notes (Signed)
Mobility Specialist: Progress Note   08/01/21 1016  Mobility  Activity Ambulated in hall  Level of Assistance Modified independent, requires aide device or extra time  Assistive Device Four wheel walker  Distance Ambulated (ft) 280 ft (140x2)  Mobility Ambulated with assistance in hallway  Mobility Response Tolerated well  Mobility performed by Mobility specialist  $Mobility charge 1 Mobility   Post-Mobility: 98 HR  Pt stopped for one seated break d/t c/o feeling SOB as well as minimal CP. Pt back to bed after walk with call bell at his side and family present in the room.   [facility] Brittie Whisnant Mobility Specialist Mobility Specialist Phone: [phone]

## 2021-08-01 NOTE — Care Management Important Message (Signed)
Important Message  Patient Details  Name: DEMAURI ADVINCULA MRN: [id number] Date of Birth: 18-Jul-1946   Medicare Important Message Given:  Yes     Shelda Altes 08/01/2021, 10:38 AM

## 2021-08-01 NOTE — TOC Initial Note (Signed)
Transition of Care [facility]) - Initial/Assessment Note    Patient Details  Name: Ryan [name] MRN: [id number] Date of Birth: 1946/01/21  Transition of Care [facility]) CM/SW Contact:    Zenon Mayo, RN Phone Number: 08/01/2021, 10:23 AM  Clinical Narrative:                 NCM offered choice, he chose [facility] for HHPT, NCM made referral to [facility] with [facility] , she is able to take referral.  Soc will begin 24 to 48 hrs post dc.  He states he has a rollator at home and all the other DME he needs , he does not need any DME .  Wife will transport him home today.   Expected Discharge Plan: [place] Barriers to Discharge: No Barriers Identified   Patient Goals and CMS Choice Patient states their goals for this hospitalization and ongoing recovery are:: return home CMS Medicare.gov Compare Post Acute Care list provided to:: Patient Choice offered to / list presented to : Patient  Expected Discharge Plan and Services Expected Discharge Plan: [place]   Discharge Planning Services: CM Consult Post Acute Care Choice: [place] arrangements for the past 2 months: Single Family Home Expected Discharge Date: 08/01/21                 DME Agency: NA       HH Arranged: PT HH Agency: [place] Date HH Agency Contacted: 08/01/21 Time HH Agency Contacted: 57 Representative spoke with at [place]: Marianela Mandrell Arrangements/Services Living arrangements for the past 2 months: [place] with:: Spouse Patient language and need for interpreter reviewed:: Yes Do you feel safe going back to the place where you live?: Yes      Need for Family Participation in Patient Care: Yes (Comment) Care giver support system in place?: Yes (comment)   Criminal Activity/Legal Involvement Pertinent to Current Situation/Hospitalization: No - Comment as needed  Activities of Daily Living Home Assistive  Devices/Equipment: Walker (specify type), Cane (specify quad or straight), Eyeglasses, Blood pressure cuff ADL Screening (condition at time of admission) Patient's cognitive ability adequate to safely complete daily activities?: Yes Is the patient deaf or have difficulty hearing?: No Does the patient have difficulty seeing, even when wearing glasses/contacts?: No Does the patient have difficulty concentrating, remembering, or making decisions?: No Patient able to express need for assistance with ADLs?: Yes Does the patient have difficulty dressing or bathing?: No Independently performs ADLs?: Yes (appropriate for developmental age) Does the patient have difficulty walking or climbing stairs?: No Weakness of Legs: Left Weakness of Arms/Hands: None  Permission Sought/Granted                  Emotional Assessment Appearance:: Appears stated age Attitude/Demeanor/Rapport: Engaged Affect (typically observed): Appropriate Orientation: : Oriented to Self, Oriented to Place, Oriented to  Time, Oriented to Situation Alcohol / Substance Use: Not Applicable Psych Involvement: No (comment)  Admission diagnosis:  Hematuria [R31.9] Urinary tract infection associated with indwelling urethral catheter, subsequent encounter [X83.382N, N39.0] CAD (coronary artery disease) [I25.10] S/P CABG x 4 [Z95.1] Patient Active Problem List   Diagnosis Date Noted   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   Urinary tract infection associated with indwelling urethral catheter ([place])    CAD (coronary artery disease) 07/21/2021   Hematuria 07/20/2021   BPH with urinary obstruction 07/17/2021   CVA (cerebral vascular accident) ([place]) 03/23/2021  Stroke-like symptoms    Urinary frequency 03/04/2021   Hx of colonic polyps 11/24/2020   Diarrhea 11/24/2020   Dyspnea 04/20/2020   Anxiety 04/20/2020   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   Emphysema lung ([place]) 04/10/2020   Elevated troponin 04/09/2020   Hypomagnesemia  04/09/2020   Hypokalemia 04/09/2020   Acute urinary retention 04/09/2020   Statin intolerance 12/19/2019   Vitamin D deficiency 09/18/2019   CHF (congestive heart failure) ([place]) 03/22/2018   PAF (paroxysmal atrial fibrillation) ([place]) 03/22/2018   Edema 03/22/2018   H/O: rheumatic fever 12/27/2017   Hyperglycemia 06/21/2017   Kidney stone 06/21/2017   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   Osteoporosis 05/31/2016   BPH (benign prostatic hyperplasia) 01/20/2015   Erectile dysfunction 01/20/2015   Rectal bleeding 01/20/2015   Preventative health care 01/20/2015   Eczema 08/03/2014   Sleep apnea 03/01/2014   Anticoagulation adequate with anticoagulant therapy 03/01/2014   Pre-syncope 02/26/2014   Abdominal pain 02/26/2014   PAD (peripheral artery disease) ([place]) 02/26/2014   Constipation 03/06/2013   Easy bruising 03/06/2013   History of alcohol abuse 04/15/2012   CAD (coronary atherosclerotic disease) 05/08/2011   Chest pain 03/10/2011   LUMBAR RADICULOPATHY, 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   UNS ADVRS EFF UNS RX MEDICINAL&BIOLOGICAL SBSTNC 11/11/2009   H/O tobacco use, presenting hazards to health 07/29/2009   COPD GOLD I 07/29/2009   Hyperlipidemia, mixed 06/20/2007   Macular degeneration (senile) of retina 06/20/2007   Essential hypertension 06/20/2007   GERD 06/16/2007   PCP:  [name], MD Pharmacy:   [facility] [phone] - [place], [place] [place] [address] Phone: [phone] Fax: [phone]  [facility]  ([place], [place] [facility] [address] [place] [address] [place] [address] Phone: [phone] Fax: [phone]  [facility] ([facility]) - [place], [place] - [address] [place] [address] Phone: [phone] Fax:  [phone]     Social Determinants of Health (SDOH) Interventions    Readmission Risk Interventions Readmission Risk Prevention Plan 08/01/2021  Transportation Screening Complete  PCP or Specialist Appt within 3-5 Days Complete  HRI or Home Care Consult Complete  Social Work Consult for [place] Planning/Counseling Complete  Palliative Care Screening Not Applicable  Medication Review Press photographer) Complete  Some recent data might be hidden

## 2021-08-01 NOTE — Progress Notes (Signed)
      [address]       [place] [address]             [phone]      10 Days Post-Op Procedure(s) (LRB): 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, ON PUMP, USING LEFT INTERNAL MAMMARY ARTERY AND ENDOSCOPICALLY HARVESTED RIGHT GREATER SAPHENOUS VEIN (N/A) TRANSESOPHAGEAL ECHOCARDIOGRAM (TEE) (N/A) APPLICATION OF CELL SAVER ENDOVEIN HARVEST OF GREATER SAPHENOUS VEIN Subjective:  Awake and alert, no new complaints or concerns. Bowel movement yesterday Remains on room air with acceptable O2 sats. He is nearly independent with ambulation and transfers.  Objective: Vital signs in last 24 hours: Temp:  [98.3 F (36.8 C)-98.6 F (37 C)] 98.5 F (36.9 C) (10/28 0757) Pulse Rate:  [84-101] 85 (10/28 0757) Cardiac Rhythm: Atrial fibrillation (10/27 1900) Resp:  [18-21] 19 (10/28 0757) BP: (97-130)/(60-68) 101/61 (10/28 0757) SpO2:  [93 %-98 %] 98 % (10/28 0757) Weight:  [77.1 kg] 77.1 kg (10/28 0456)    Intake/Output from previous day: 10/27 0701 - 10/28 0700 In: 480 [P.O.:480] Out: 200 [Urine:200] Intake/Output this shift: No intake/output data recorded.  General appearance: alert, cooperative, and no distress Neurologic: intact Heart: irregularly irregular rhythm, HR low 100's/min Lungs: CTA bilat Abdomen:  soft, non-tender Wound: clean and dry, pacer wires are out.   Lab Results: Recent Labs    07/29/21 1745 07/31/21 0050  WBC 17.5* 16.6*  HGB 8.9* 8.0*  HCT 27.2* 24.8*  PLT 380 431*    BMET:  Recent Labs    07/31/21 0050 08/01/21 0111  NA 134* 133*  K 4.1 4.0  CL 99 101  CO2 28 22  GLUCOSE 97 81  BUN 22 19  CREATININE 1.59* 1.37*  CALCIUM 8.4* 8.0*     PT/INR: No results for input(s): LABPROT, INR in the last 72 hours. ABG    Component Value Date/Time   PHART 7.358 07/22/2021 1840   HCO3 20.8 07/22/2021 1840   TCO2 22 07/22/2021 1840   ACIDBASEDEF 4.0 (H) 07/22/2021 1840   O2SAT 96.0 07/22/2021 1840   CBG (last 3)   No results for input(s): GLUCAP in the last 72 hours.   Assessment/Plan: S/P Procedure(s) (LRB): 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, ON PUMP, USING LEFT INTERNAL MAMMARY ARTERY AND ENDOSCOPICALLY HARVESTED RIGHT GREATER SAPHENOUS VEIN (N/A) TRANSESOPHAGEAL ECHOCARDIOGRAM (TEE) (N/A) APPLICATION OF CELL SAVER ENDOVEIN HARVEST OF GREATER SAPHENOUS VEIN  POD # 10 CV- remains in atrial fib with acceptable rate control.  This is chronic- continue lopressor, propafenone. On Pradaxa  RESP- continue IS  RENAL- weight near pre-op level. Creat is trending back down.  Had minimal postvoid residual yesterday.  Urinalysis shows rare bacteria and few white blood cells.  Nitrate negative.  Overall, urine microscopic has improved compared to previous studies this admission.  HEME- expected acute blood loss anemia- Hct trending up. On Fe++ supplement. Leukocytosis without fever or signs of infection, WBC trenging down. No productive cough, no dysuria, incisions hav no signs of infection. CXR OK.    GI- tolerating PO's, bowel movement yesterday.  Disposition- Progressing well with ambulation.  He says he has family to help him at home and he is ready for discharge today.  Home health PT has been arranged for recommendations.    LOS: 12 days   Antony Odea, [place] [phone] 08/01/2021

## 2021-08-03 ENCOUNTER — Other Ambulatory Visit: Payer: Self-pay | Admitting: Medical

## 2021-08-04 NOTE — Discharge Summary (Signed)
Date of admission: 07/17/2021  Date of discharge: 08/04/2021  Admission diagnosis: BPH    Discharge diagnosis: BPH  Secondary diagnoses: None  History and Physical: For full details, please see admission history and physical. Briefly, Ryan [name] is a 75 y.o. male with BPH.   Hospital Course:  The patient underwent transurethral resection of prostate on 10.13.2022. Continuous bladder irrigation was initiated post-operatively. He tolerated the procedure well and was transferred to the floor after receiving routine post-operative care. His diet was gradually advanced, and his pain was controlled with oral analgesics.  By POD1, his urine had cleared on slow drip CBI, so CBI was stopped. His hematuria remained mild with CBI stopped and after ambulation. CBI was disconnected, and the 3rd port of his foley catheter was plugged.   By later on POD1, he was tolerating a regular diet, ambulating, having good urine output with mild hematuria via foley catheter, and having pain well-controlled with oral analgesics. Thus, he was deemed appropriate for discharge home. His foley catheter was removed and he voided adequately Laboratory values:  No results for input(s): HGB, HCT in the last 72 hours. No results for input(s): CREATININE in the last 72 hours. Physical Exam:  General: Alert and oriented CV: Regular rate Lungs: NWOB on RA Extremities: Warm and well-perfused   Disposition: Home  Discharge instruction: The patient was instructed to be ambulatory but told to refrain from heavy lifting, strenuous activity, or driving while on narcotics.   Discharge medications:  Allergies as of 07/18/2021       Reactions   Albumin (human) Anaphylaxis   Polymyxin B-trimethoprim Swelling   Eye drops made eyes swell   Pseudoephedrine Other (See Comments)   Stomach cramps   Codeine Hives, Itching, Rash   Guaiacol Other (See Comments)   Hallucinations   Statins Other (See Comments)   Muscle cramps    Gabapentin Other (See Comments)   unk   Meloxicam Other (See Comments)   Unknown reaction   Pseudoephedrine-guaifenesin Nausea And Vomiting   Stomach cramps   Rosuvastatin Calcium Other (See Comments)   Muscle aches   Tapentadol Other (See Comments)   Unknown reaction   Ciprofloxacin Hives, Itching, Nausea Only, Rash   Moxifloxacin Nausea Only, Other (See Comments)   Headaches, stomach cramps   Rofecoxib Other (See Comments)   Stomach cramping        Medication List     TAKE these medications    brimonidine 0.2 % ophthalmic solution Commonly known as: ALPHAGAN Place 1 drop into the left eye in the morning and at bedtime.   budesonide 3 MG 24 hr capsule Commonly known as: ENTOCORT EC Take 2 capsules (6 mg total) by mouth daily for 42 days, THEN 1 capsule (3 mg total) daily. Start taking on: June 19, 2021 What changed: See the new instructions.   clobetasol 0.05 % external solution Commonly known as: TEMOVATE APPLY 1 APPLICATION TOPICALLY TWICE DAILY   dabigatran 150 MG Caps capsule Commonly known as: Pradaxa Take 1 capsule (150 mg total) by mouth 2 (two) times daily.   latanoprost 0.005 % ophthalmic solution Commonly known as: XALATAN Place 1 drop into the left eye at bedtime.   levalbuterol 45 MCG/ACT inhaler Commonly known as: XOPENEX HFA Inhale 2 puffs into the lungs every 4 (four) hours as needed for wheezing.   LUCENTIS IO Inject 1 Dose into the eye once as needed (Macular degeneration).   magnesium oxide 400 (240 Mg) MG tablet Commonly known as: MAGnesium-Oxide TAKE  1 TABLET(400 MG) BY MOUTH THREE TIMES DAILY AS NEEDED What changed:  how much to take how to take this when to take this additional instructions   metoprolol tartrate 25 MG tablet Commonly known as: LOPRESSOR TAKE 1 TABLET BY MOUTH  TWICE DAILY   nitroGLYCERIN 0.4 MG SL tablet Commonly known as: NITROSTAT Place 1 tablet (0.4 mg total) under the tongue every 5 (five) minutes  as needed for chest pain.   omeprazole 20 MG tablet Commonly known as: PRILOSEC OTC Take 20 mg by mouth daily.   propafenone 225 MG tablet Commonly known as: RYTHMOL TAKE 1 TABLET BY MOUTH  TWICE DAILY   tamsulosin 0.4 MG Caps capsule Commonly known as: FLOMAX Take 0.4 mg by mouth 2 (two) times daily.          Followup:   Follow-up Information     [name], MD Follow up.   Specialty: Urology Why: 11.2.2022 @ 12:15 Contact information: [place] [address] [phone]

## 2021-08-05 ENCOUNTER — Telehealth: Payer: Self-pay

## 2021-08-05 ENCOUNTER — Telehealth: Payer: Self-pay | Admitting: *Deleted

## 2021-08-05 NOTE — Telephone Encounter (Signed)
Marcie Bal RN Case Manager with [facility] contacted the office to state patient had questions she could not answer as to if patient was allowed to sleep in his bed. He is s/p CABG with Dr. Roxan Hockey 07/22/21 and was advised to not lay in the bed unless otherwise instructed to do so, that he should only be sleeping in a recliner.   Contacted patient and advised that he can sleep in his bed in which ever comfortable position he prefers, except stomach. Also advised he needed to keep in mind of sternal precautions and not use arms/ upper body to lift himself out of the bed. He acknowledged receipt.

## 2021-08-05 NOTE — Telephone Encounter (Signed)
Transition Care Management Follow-up Telephone Call Date of discharge and from where: 08/01/21 from [facility] How have you been since you were released from the hospital? "Just really fatigued" Any questions or concerns? Yes, "When can I start sleeping in my bed? They told me to sleep in a recliner when I got home until otherwise instructed." Per patient request, left message with Dr Hendrickson's office RN Charlena Cross requesting she call patient and advise of when he may sleep in his bed.   Items Reviewed: Did the pt receive and understand the discharge instructions provided? Yes  Medications obtained and verified? Yes  Other? No  Any new allergies since your discharge? Yes  Dietary orders reviewed? Yes Do you have support at home? Yes   Home Care and Equipment/Supplies: Were home health services ordered? yes If so, what is the name of the agency? [facility]  Has the agency set up a time to come to the patient's home? No- patient states the physical therapist assigned to him is sick and they have not been able to find a replacement. He says he will call them today and asked for another therapist if they don't contact him first.  Were any new equipment or medical supplies ordered?  No What is the name of the medical supply agency? Not applicable Were you able to get the supplies/equipment? not applicable Do you have any questions related to the use of the equipment or supplies? No  Functional Questionnaire: (I = Independent and D = Dependent) ADLs: Assist by sister in law   Bathing/Dressing- Assist   Meal Prep- Assist by sister in law  Eating- I  Maintaining continence- I  Transferring/Ambulation- I; using rollator  Managing Meds- I  Follow up appointments reviewed:  PCP Hospital f/u appt confirmed? Yes  Scheduled to see Dr Randel Pigg  on 09/23/21 @ 3:00 pm. [place] [facility] f/u appt confirmed? Yes  Scheduled to see cardiology PA on 08/12/21 @ 10:45 am. To see  surgeon on 08/26/21 at 10:00 am Are transportation arrangements needed? No  If their condition worsens, is the pt aware to call PCP or go to the Emergency Dept.? Yes Was the patient provided with contact information for the PCP's office or ED? Yes Was to pt encouraged to call back with questions or concerns? Yes    Kelli Churn RN, CCM, [facility] Care Management Coordinator - Managed [place] [facility] [phone]

## 2021-08-05 NOTE — Progress Notes (Signed)
Cardiology Office Note    Date:  08/12/2021   ID:  Ryan [name], DOB Oct 23, 1945, MRN [id number]   PCP:  [name], MD   [place]  Cardiologist:  Cristopher Peru, MD   Advanced Practice Provider:  No care team member to display Electrophysiologist:  None   [phone]   Chief Complaint  Patient presents with   Hospitalization Follow-up     History of Present Illness:  Ryan [name] is a 75 y.o. male with history of CAD, hypertension, CKD, COPD, PAF on Rythmol and Pradaxa.  While in the hospital for hematuria and bladder spasms he developed chest pain with EKG changes.  Cardiac cath showed progression of disease and he underwent CABG times 4-10/18/22.  Postop course complicated by ileus.  He remained in A. fib with rate control.  Preop echo mild reduced LVEF 45 to 50% but postop EF 50% Wall motion was normal.  Patient comes back for f/u. Has to have a PET scan Friday for prostate CA. Still having shooting chest pain into his back. PT says he can use ice/heat. Afraid of pain meds. Walking a lot with PT-60 ft with walker assistance.    Past Medical History:  Diagnosis Date   Anemia    Anticoagulant long-term use    changed from eliquis to praxada 06/ 2022--- mananged by cardiology   BPH with urinary obstruction    CAD (coronary artery disease)    cardiologist--- dr g. taylor--- cath 04-22-2011  moderate LM stenosis, borderline sig pRCA, sig stenosis mid to distal LAD ;  aggressive medical therapy   Chronic diastolic CHF (congestive heart failure) ([place]) 03/2018   followed by cardiology   CKD (chronic kidney disease), stage II    COPD (chronic obstructive pulmonary disease) ([place])    followed by pcp   DOE (dyspnea on exertion)    per pt when walk >100yds,  per pt rides outside bike 4-8 miles daily without sob,  household chores and yard work without sob   Eczema    GERD (gastroesophageal reflux disease)    Glaucoma, left eye    Heart murmur     Hemorrhoids    History of adenomatous polyp of colon    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 Bryan Lemma--- dx by biopsy 01-28-2021   Macular degeneration of both eyes    Osteoporosis 05/31/2016   PAF (paroxysmal atrial fibrillation) ([place]) 03/2012   cardiologist-- dr g. taylor;  first dx 06/ 2013 AFlutter w/ RVR   Urethral lesion    Vitamin D deficiency     Past Surgical History:  Procedure Laterality Date   ANTERIOR CERVICAL DECOMP/DISCECTOMY FUSION  06/08/2002   @[facility] ;  C5--C7   [place]  04/22/2011   moderate left main and RCA stenosis not significant by FFR and IVUS on medical therapy   CARDIOVERSION N/A 04/25/2018   Procedure: CARDIOVERSION;  Surgeon: Skeet Latch, MD;  Location: [facility];  Service: Cardiovascular;  Laterality: N/A;   CATARACT EXTRACTION W/ INTRAOCULAR LENS IMPLANT Bilateral 2021   COLONOSCOPY WITH ESOPHAGOGASTRODUODENOSCOPY (EGD)  01/28/2021   by [email or web address]   CORONARY ARTERY BYPASS GRAFT N/A 07/22/2021   Procedure: 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, ON PUMP, USING LEFT INTERNAL MAMMARY ARTERY AND ENDOSCOPICALLY HARVESTED RIGHT GREATER SAPHENOUS VEIN;  Surgeon: Melrose Nakayama, MD;  Location: [facility];  Service: Open Heart Surgery;  Laterality: N/A;   ENDOVEIN HARVEST OF GREATER SAPHENOUS VEIN  07/22/2021   Procedure: ENDOVEIN  HARVEST OF GREATER SAPHENOUS VEIN;  Surgeon: Melrose Nakayama, MD;  Location: [facility];  Service: Open Heart Surgery;;   EXTRACORPOREAL SHOCK WAVE LITHOTRIPSY     x2  1990s   FOOT SURGERY Right 1970   calcification removed from top of foot   KNEE ARTHROSCOPY Right [place]   LEFT HEART CATH AND CORONARY ANGIOGRAPHY N/A 07/21/2021   Procedure: LEFT HEART CATH AND CORONARY ANGIOGRAPHY;  Surgeon: Lorretta Harp, MD;  Location: [facility];  Service: Cardiovascular;  Laterality: N/A;   TEE WITHOUT CARDIOVERSION N/A 07/22/2021   Procedure: TRANSESOPHAGEAL ECHOCARDIOGRAM (TEE);  Surgeon: Melrose Nakayama, MD;  Location: [facility];  Service: Open Heart Surgery;  Laterality: N/A;   TOTAL KNEE ARTHROPLASTY Right 08/14/2009   @[facility]    TRANSURETHRAL RESECTION OF PROSTATE N/A 07/17/2021   Procedure: TRANSURETHRAL RESECTION OF THE PROSTATE (TURP);  Surgeon: [name], MD;  Location[facility];  Service: Urology;  Laterality: N/A;  1 HR    Current Medications: Current Meds  Medication Sig   aspirin EC 81 MG EC tablet Take 1 tablet (81 mg total) by mouth daily. Swallow whole.   Baclofen 5 MG TABS Take 5 mg by mouth 3 (three) times daily as needed (for hiccups).   brimonidine (ALPHAGAN) 0.2 % ophthalmic solution Place 1 drop into the left eye in the morning and at bedtime.    budesonide (ENTOCORT EC) 3 MG 24 hr capsule Take 3 mg by mouth daily.   clobetasol (TEMOVATE) 0.05 % external solution APPLY 1 APPLICATION TOPICALLY TWICE DAILY   dabigatran (PRADAXA) 150 MG CAPS capsule Take 1 capsule (150 mg total) by mouth 2 (two) times daily.   latanoprost (XALATAN) 0.005 % ophthalmic solution Place 1 drop into the left eye at bedtime.    levalbuterol (XOPENEX HFA) 45 MCG/ACT inhaler INHALE 2 PUFFS BY MOUTH INTO THE LUNGS EVERY 4 HOURS AS NEEDED FOR WHEEZING   MAGnesium-Oxide 400 (240 Mg) MG tablet TAKE 1 TABLET(400 MG) BY MOUTH THREE TIMES  DAILY AS NEEDED (Patient taking differently: Take 400 mg by mouth 2 (two) times daily.)   metoprolol tartrate (LOPRESSOR) 25 MG tablet TAKE 1 TABLET BY MOUTH  TWICE DAILY (Patient taking differently: Take 25 mg by mouth 2 (two) times daily.)   nitroGLYCERIN (NITROSTAT) 0.4 MG SL tablet Place 1 tablet (0.4 mg total) under the tongue every 5 (five) minutes as needed for chest pain.   omeprazole (PRILOSEC OTC) 20 MG tablet Take 20 mg by mouth daily.   oxyCODONE (OXY IR/ROXICODONE) 5 MG immediate release tablet Take 1 tablet (5 mg total) by mouth every 4 (four) hours as needed (pain).   propafenone (RYTHMOL) 225 MG tablet TAKE 1 TABLET BY  MOUTH  TWICE DAILY (Patient taking differently: Take 225 mg by mouth 2 (two) times daily.)   Ranibizumab (LUCENTIS IO) Inject 1 Dose into the eye once as needed (Macular degeneration).   tamsulosin (FLOMAX) 0.4 MG CAPS capsule Take 0.4 mg by mouth 2 (two) times daily.     Allergies:   Albumin (human), Polymyxin b-trimethoprim, Pseudoephedrine, Codeine, Guaiacol, Statins, Gabapentin, Meloxicam, Peppermint flavor, Pseudoephedrine-guaifenesin, Rosuvastatin calcium, Tapentadol, Ciprofloxacin, Moxifloxacin, and Rofecoxib   Social History   Socioeconomic History   Marital status: Married    Spouse name: Thayer Headings   Number of children: 0   Years of education: Not on file   Highest education level: Not on file  Occupational History   Occupation: retired    Fish farm manager: RETIRED  Tobacco Use   Smoking status: Former    Packs/day: 0.50    Years: 50.00    Pack years: 25.00    Types: Cigarettes    Quit date: 05/01/2009    Years since quitting: 12.2   Smokeless tobacco: Former    Types: Chew    Quit date: 1995  Vaping Use   Vaping Use: Never used  Substance and Sexual Activity   Alcohol use: Yes    Alcohol/week: 2.0 standard drinks    Types: 2 Cans of beer per week    Comment: 1 beer daily   Drug use: Never   Sexual activity: Yes    Comment: lives with wife, no  dietary restrictions  Other Topics Concern   Not on file  Social History Narrative   Lives with wife   Social Determinants of Health   Financial Resource Strain: Not on file  Food Insecurity: Not on file  Transportation Needs: Not on file  Physical Activity: Not on file  Stress: Not on file  Social Connections: Not on file     Family History:  The patient's  family history includes Arthritis in his sister and sister; Cancer in his father; Cirrhosis in his mother and sister; Coronary artery disease in an other family member; Diabetes in his mother; Emphysema in his mother; Heart attack in his father; Heart disease in his brother, father, mother, sister, and sister; Hyperlipidemia in his brother, father, and sister; Hypertension in his father and mother; Liver disease in an other family member; Macular degeneration in his maternal grandfather, mother, and sister; Obesity in his sister, sister, and sister; Peripheral vascular disease in his father; Prostate cancer in an other family member; Varicose Veins in his father.   ROS:   Please see the history of present illness.    ROS All other systems reviewed and are negative.   PHYSICAL EXAM:   VS:  BP 120/80   Pulse 73   Ht 5\' 4"  (1.626 m)   Wt 171 lb (77.6 kg)   SpO2 97%   BMI 29.35 kg/m   Physical Exam  GEN: Well nourished, well developed, in no acute distress  Neck: no JVD, carotid bruits, or masses Cardiac:incisions healing well.irreg irreg; no murmurs, rubs, or gallops  Respiratory:  clear to auscultation bilaterally, normal work of breathing GI: soft, nontender, nondistended, + BS Ext: without cyanosis, clubbing, or edema, Good distal pulses bilaterally Neuro:  Alert and Oriented x 3 Psych: euthymic mood, full affect  Wt Readings from Last 3 Encounters:  08/12/21 171 lb (77.6 kg)  08/01/21 169 lb 14.4 oz (77.1 kg)  07/17/21 173 lb 3.2 oz (78.6 kg)      Studies/Labs Reviewed:   EKG:  EKG is not ordered  today.      Recent Labs: 09/09/2020: Pro B Natriuretic peptide (BNP) 209.0 03/04/2021: TSH 2.38 03/27/2021: B Natriuretic Peptide 96.2 07/21/2021: ALT 12 07/25/2021: Magnesium 2.0 07/31/2021: Hemoglobin 8.0; Platelets 431 08/01/2021: BUN 19; Creatinine, Ser 1.37; Potassium 4.0; Sodium 133   Lipid Panel    Component Value Date/Time   CHOL 289 (H) 03/24/2021 1215   TRIG 80 03/24/2021 1215   TRIG 43 09/06/2006 0951   HDL 51 03/24/2021 1215   CHOLHDL 5.7 03/24/2021 1215   VLDL 16 03/24/2021 1215   LDLCALC 222 (H) 03/24/2021 1215   LDLCALC 192 (H) 06/04/2020 0717   LDLDIRECT 134.2 12/01/2013 1113    Additional studies/ records that were reviewed today include:  Cardiac cath 07/21/2021 Significant Diagnostic Studies: angiography:      Ost LM lesion is 90% stenosed.   Ost Cx to Prox Cx lesion is 60% stenosed.   Prox RCA lesion is 95% stenosed.   Dist RCA lesion is 70% stenosed.   RPDA lesion is 70% stenosed.     Risk Assessment/Calculations:    CHA2DS2-VASc Score = 6   This indicates a 9.7% annual risk of stroke. The patient's score is based upon: CHF History: 1 HTN History: 1 Diabetes History: 0 Stroke History: 2 Vascular Disease History: 1 Age Score: 1 Gender Score: 0        ASSESSMENT:    1. Coronary artery disease involving native coronary artery of native heart without angina pectoris   2. PAF (paroxysmal atrial fibrillation) ([place])   3. Essential hypertension      PLAN:  In order of problems listed above:  CAD status post CABG times 4-10/18/22 LIMA to the LAD, SVG to OM1, SVG to acute marginal and PDA  PAF on Rythmol and Pradaxa followed by Dr. Lovena Le.  Heart rate irregular today rate controlled.  We will have patient follow-up with Dr. Lovena Le  Hypertension well-controlled    Cardiac Rehabilitation Eligibility Assessment  -- (Patient is stable from cardiac standpoint but doing PT and can walk 60 feet with a walker. cardiac rehab wants him able to walk 200  ft)        Shared Decision Making/Informed Consent        Medication Adjustments/Labs and Tests Ordered: Current medicines are reviewed at length with the patient today.  Concerns regarding medicines are outlined above.  Medication changes, Labs and Tests ordered today are listed in the Patient Instructions below. Patient Instructions  Medication Instructions:  Your physician recommends that you continue on your current medications as directed. Please refer to the Current Medication list given to you today.  *If you need a refill on your cardiac medications before your next appointment, please call your pharmacy*   Lab Work: CBC and BMET today If you have labs (blood work) drawn today and your tests are completely normal, you will receive your results only by: [place] (if you have MyChart) OR A paper copy in the mail If you have any lab test that is abnormal or we need to change your treatment, we will call you to review the results.  Follow-Up: At [facility], you and your health needs are our priority.  As part of our continuing mission to provide you with exceptional heart care, we have created designated Provider Care Teams.  These Care Teams include your primary Cardiologist (physician) and Advanced Practice Providers (APPs -  Physician Assistants and Nurse Practitioners) who all work together to provide you with the care you need, when you need  it.   Your next appointment:   2 month(s)  The format for your next appointment:   In Person  Provider:   Cristopher Peru, MD {     Signed, Ermalinda Barrios, PA-C  08/12/2021 1:43 PM    [address], [place], [place]  [address] Phone: [phone]; Fax: [phone]

## 2021-08-06 ENCOUNTER — Telehealth: Payer: Self-pay | Admitting: Family Medicine

## 2021-08-06 DIAGNOSIS — I4892 Unspecified atrial flutter: Secondary | ICD-10-CM | POA: Diagnosis not present

## 2021-08-06 DIAGNOSIS — I2510 Atherosclerotic heart disease of native coronary artery without angina pectoris: Secondary | ICD-10-CM | POA: Diagnosis not present

## 2021-08-06 DIAGNOSIS — H409 Unspecified glaucoma: Secondary | ICD-10-CM | POA: Diagnosis not present

## 2021-08-06 DIAGNOSIS — N390 Urinary tract infection, site not specified: Secondary | ICD-10-CM | POA: Diagnosis not present

## 2021-08-06 DIAGNOSIS — M5416 Radiculopathy, lumbar region: Secondary | ICD-10-CM | POA: Diagnosis not present

## 2021-08-06 DIAGNOSIS — Z951 Presence of aortocoronary bypass graft: Secondary | ICD-10-CM | POA: Diagnosis not present

## 2021-08-06 DIAGNOSIS — F4321 Adjustment disorder with depressed mood: Secondary | ICD-10-CM | POA: Diagnosis not present

## 2021-08-06 DIAGNOSIS — Z48812 Encounter for surgical aftercare following surgery on the circulatory system: Secondary | ICD-10-CM | POA: Diagnosis not present

## 2021-08-06 DIAGNOSIS — N182 Chronic kidney disease, stage 2 (mild): Secondary | ICD-10-CM | POA: Diagnosis not present

## 2021-08-06 DIAGNOSIS — J439 Emphysema, unspecified: Secondary | ICD-10-CM | POA: Diagnosis not present

## 2021-08-06 DIAGNOSIS — G4730 Sleep apnea, unspecified: Secondary | ICD-10-CM | POA: Diagnosis not present

## 2021-08-06 DIAGNOSIS — I739 Peripheral vascular disease, unspecified: Secondary | ICD-10-CM | POA: Diagnosis not present

## 2021-08-06 DIAGNOSIS — N401 Enlarged prostate with lower urinary tract symptoms: Secondary | ICD-10-CM | POA: Diagnosis not present

## 2021-08-06 DIAGNOSIS — I5032 Chronic diastolic (congestive) heart failure: Secondary | ICD-10-CM | POA: Diagnosis not present

## 2021-08-06 DIAGNOSIS — F419 Anxiety disorder, unspecified: Secondary | ICD-10-CM | POA: Diagnosis not present

## 2021-08-06 DIAGNOSIS — Z7982 Long term (current) use of aspirin: Secondary | ICD-10-CM | POA: Diagnosis not present

## 2021-08-06 DIAGNOSIS — T83511D Infection and inflammatory reaction due to indwelling urethral catheter, subsequent encounter: Secondary | ICD-10-CM | POA: Diagnosis not present

## 2021-08-06 DIAGNOSIS — I130 Hypertensive heart and chronic kidney disease with heart failure and stage 1 through stage 4 chronic kidney disease, or unspecified chronic kidney disease: Secondary | ICD-10-CM | POA: Diagnosis not present

## 2021-08-06 DIAGNOSIS — D631 Anemia in chronic kidney disease: Secondary | ICD-10-CM | POA: Diagnosis not present

## 2021-08-06 DIAGNOSIS — I480 Paroxysmal atrial fibrillation: Secondary | ICD-10-CM | POA: Diagnosis not present

## 2021-08-06 DIAGNOSIS — E782 Mixed hyperlipidemia: Secondary | ICD-10-CM | POA: Diagnosis not present

## 2021-08-06 DIAGNOSIS — K219 Gastro-esophageal reflux disease without esophagitis: Secondary | ICD-10-CM | POA: Diagnosis not present

## 2021-08-06 DIAGNOSIS — N138 Other obstructive and reflux uropathy: Secondary | ICD-10-CM | POA: Diagnosis not present

## 2021-08-06 DIAGNOSIS — M810 Age-related osteoporosis without current pathological fracture: Secondary | ICD-10-CM | POA: Diagnosis not present

## 2021-08-06 DIAGNOSIS — K5900 Constipation, unspecified: Secondary | ICD-10-CM | POA: Diagnosis not present

## 2021-08-06 NOTE — Telephone Encounter (Signed)
Caller/Agency: [facility] Number: [phone]: okay to leave message with verbal orders. Requesting OT/PT/Skilled Nursing/Social Work/Speech Therapy: PT Frequency:  2x week for 2week  1x a week every other week

## 2021-08-06 NOTE — Telephone Encounter (Signed)
Verbal orders given to Mickel Baas.

## 2021-08-08 DIAGNOSIS — I130 Hypertensive heart and chronic kidney disease with heart failure and stage 1 through stage 4 chronic kidney disease, or unspecified chronic kidney disease: Secondary | ICD-10-CM | POA: Diagnosis not present

## 2021-08-08 DIAGNOSIS — T83511D Infection and inflammatory reaction due to indwelling urethral catheter, subsequent encounter: Secondary | ICD-10-CM | POA: Diagnosis not present

## 2021-08-08 DIAGNOSIS — N390 Urinary tract infection, site not specified: Secondary | ICD-10-CM | POA: Diagnosis not present

## 2021-08-08 DIAGNOSIS — Z48812 Encounter for surgical aftercare following surgery on the circulatory system: Secondary | ICD-10-CM | POA: Diagnosis not present

## 2021-08-08 DIAGNOSIS — I2510 Atherosclerotic heart disease of native coronary artery without angina pectoris: Secondary | ICD-10-CM | POA: Diagnosis not present

## 2021-08-08 DIAGNOSIS — Z951 Presence of aortocoronary bypass graft: Secondary | ICD-10-CM | POA: Diagnosis not present

## 2021-08-11 ENCOUNTER — Telehealth (HOSPITAL_COMMUNITY): Payer: Self-pay

## 2021-08-11 ENCOUNTER — Other Ambulatory Visit (HOSPITAL_COMMUNITY): Payer: Self-pay | Admitting: Urology

## 2021-08-11 DIAGNOSIS — T83511D Infection and inflammatory reaction due to indwelling urethral catheter, subsequent encounter: Secondary | ICD-10-CM | POA: Diagnosis not present

## 2021-08-11 DIAGNOSIS — N390 Urinary tract infection, site not specified: Secondary | ICD-10-CM | POA: Diagnosis not present

## 2021-08-11 DIAGNOSIS — C61 Malignant neoplasm of prostate: Secondary | ICD-10-CM

## 2021-08-11 DIAGNOSIS — I2510 Atherosclerotic heart disease of native coronary artery without angina pectoris: Secondary | ICD-10-CM | POA: Diagnosis not present

## 2021-08-11 DIAGNOSIS — Z951 Presence of aortocoronary bypass graft: Secondary | ICD-10-CM | POA: Diagnosis not present

## 2021-08-11 DIAGNOSIS — I130 Hypertensive heart and chronic kidney disease with heart failure and stage 1 through stage 4 chronic kidney disease, or unspecified chronic kidney disease: Secondary | ICD-10-CM | POA: Diagnosis not present

## 2021-08-11 DIAGNOSIS — Z48812 Encounter for surgical aftercare following surgery on the circulatory system: Secondary | ICD-10-CM | POA: Diagnosis not present

## 2021-08-11 NOTE — Telephone Encounter (Signed)
Called patient to see if he is interested in the Cardiac Rehab Program. Patient expressed interest. Explained scheduling process and went over insurance process, patient verbalized understanding. Will contact patient for scheduling once f/u has been completed.

## 2021-08-12 ENCOUNTER — Encounter: Payer: Self-pay | Admitting: Physician Assistant

## 2021-08-12 ENCOUNTER — Other Ambulatory Visit: Payer: Self-pay

## 2021-08-12 ENCOUNTER — Ambulatory Visit (INDEPENDENT_AMBULATORY_CARE_PROVIDER_SITE_OTHER): Payer: Medicare Other | Admitting: Physician Assistant

## 2021-08-12 VITALS — BP 120/80 | HR 73 | Ht 64.0 in | Wt 171.0 lb

## 2021-08-12 DIAGNOSIS — I10 Essential (primary) hypertension: Secondary | ICD-10-CM

## 2021-08-12 DIAGNOSIS — I2584 Coronary atherosclerosis due to calcified coronary lesion: Secondary | ICD-10-CM

## 2021-08-12 DIAGNOSIS — I2510 Atherosclerotic heart disease of native coronary artery without angina pectoris: Secondary | ICD-10-CM | POA: Diagnosis not present

## 2021-08-12 DIAGNOSIS — I480 Paroxysmal atrial fibrillation: Secondary | ICD-10-CM

## 2021-08-12 LAB — CBC
Hematocrit: 25.5 % — ABNORMAL LOW (ref 37.5–51.0)
Hemoglobin: 8.2 g/dL — ABNORMAL LOW (ref 13.0–17.7)
MCH: 28.5 pg (ref 26.6–33.0)
MCHC: 32.2 g/dL (ref 31.5–35.7)
MCV: 89 fL (ref 79–97)
Platelets: 369 10*3/uL (ref 150–450)
RBC: 2.88 x10E6/uL — ABNORMAL LOW (ref 4.14–5.80)
RDW: 13.1 % (ref 11.6–15.4)
WBC: 9.8 10*3/uL (ref 3.4–10.8)

## 2021-08-12 LAB — BASIC METABOLIC PANEL
BUN/Creatinine Ratio: 15 (ref 10–24)
BUN: 20 mg/dL (ref 8–27)
CO2: 22 mmol/L (ref 20–29)
Calcium: 9 mg/dL (ref 8.6–10.2)
Chloride: 102 mmol/L (ref 96–106)
Creatinine, Ser: 1.32 mg/dL — ABNORMAL HIGH (ref 0.76–1.27)
Glucose: 97 mg/dL (ref 70–99)
Potassium: 5.1 mmol/L (ref 3.5–5.2)
Sodium: 137 mmol/L (ref 134–144)
eGFR: 57 mL/min/{1.73_m2} — ABNORMAL LOW (ref >59)

## 2021-08-12 NOTE — Patient Instructions (Signed)
Medication Instructions:  Your physician recommends that you continue on your current medications as directed. Please refer to the Current Medication list given to you today.  *If you need a refill on your cardiac medications before your next appointment, please call your pharmacy*   Lab Work: CBC and BMET today If you have labs (blood work) drawn today and your tests are completely normal, you will receive your results only by: [place] (if you have MyChart) OR A paper copy in the mail If you have any lab test that is abnormal or we need to change your treatment, we will call you to review the results.  Follow-Up: At [facility], you and your health needs are our priority.  As part of our continuing mission to provide you with exceptional heart care, we have created designated Provider Care Teams.  These Care Teams include your primary Cardiologist (physician) and Advanced Practice Providers (APPs -  Physician Assistants and Nurse Practitioners) who all work together to provide you with the care you need, when you need it.   Your next appointment:   2 month(s)  The format for your next appointment:   In Person  Provider:   Cristopher Peru, MD {

## 2021-08-13 ENCOUNTER — Telehealth: Payer: Self-pay | Admitting: Physician Assistant

## 2021-08-13 DIAGNOSIS — Z48812 Encounter for surgical aftercare following surgery on the circulatory system: Secondary | ICD-10-CM | POA: Diagnosis not present

## 2021-08-13 DIAGNOSIS — N390 Urinary tract infection, site not specified: Secondary | ICD-10-CM | POA: Diagnosis not present

## 2021-08-13 DIAGNOSIS — I130 Hypertensive heart and chronic kidney disease with heart failure and stage 1 through stage 4 chronic kidney disease, or unspecified chronic kidney disease: Secondary | ICD-10-CM | POA: Diagnosis not present

## 2021-08-13 DIAGNOSIS — Z951 Presence of aortocoronary bypass graft: Secondary | ICD-10-CM | POA: Diagnosis not present

## 2021-08-13 DIAGNOSIS — T83511D Infection and inflammatory reaction due to indwelling urethral catheter, subsequent encounter: Secondary | ICD-10-CM | POA: Diagnosis not present

## 2021-08-13 DIAGNOSIS — I2510 Atherosclerotic heart disease of native coronary artery without angina pectoris: Secondary | ICD-10-CM | POA: Diagnosis not present

## 2021-08-13 NOTE — Telephone Encounter (Signed)
Patient was returning a call from April to discuss lab results

## 2021-08-14 DIAGNOSIS — Z951 Presence of aortocoronary bypass graft: Secondary | ICD-10-CM | POA: Diagnosis not present

## 2021-08-14 DIAGNOSIS — H353231 Exudative age-related macular degeneration, bilateral, with active choroidal neovascularization: Secondary | ICD-10-CM | POA: Diagnosis not present

## 2021-08-14 DIAGNOSIS — H26499 Other secondary cataract, unspecified eye: Secondary | ICD-10-CM | POA: Diagnosis not present

## 2021-08-14 DIAGNOSIS — C61 Malignant neoplasm of prostate: Secondary | ICD-10-CM | POA: Diagnosis not present

## 2021-08-15 ENCOUNTER — Encounter (HOSPITAL_COMMUNITY)
Admission: RE | Admit: 2021-08-15 | Discharge: 2021-08-15 | Disposition: A | Discharge status: Home / Self Care | Payer: Medicare Other | Source: Ambulatory Visit | Attending: Urology | Admitting: Urology

## 2021-08-15 DIAGNOSIS — C61 Malignant neoplasm of prostate: Secondary | ICD-10-CM | POA: Diagnosis not present

## 2021-08-15 MED ORDER — PIFLIFOLASTAT F 18 (PYLARIFY) INJECTION
9.0000 | Freq: Once | INTRAVENOUS | Status: AC
  Administered 2021-08-15: 8.7 via INTRAVENOUS

## 2021-08-18 NOTE — Progress Notes (Signed)
Patient ID: Ryan [name]                 DOB: 03-Jun-1946                    MRN: [id number]     HPI: Ryan [name] is a 75 y.o. male patient of [name] referred to lipid clinic at hospital discharge 07/2021 for CABGx4 07/22/21. PMH is significant for CAD, HTN, CVA, CKD, COPD, PAF, diastolic dysfunction - LVEF 50% on 07/22/21. He had previously been referred to lipid clinic by his PCP but he did not proceed.   Today, patient arrives in good spirits accompanied by his wife. Reports he has been going through a lot lately with his recent CABG and needing radiation for prostate cancer. He reports he has had high cholesterol ever since he was 75 years old and had rheumatic fever. He reports having severe muscle pain all over with multiple statins, even at as low of a dose of rosuvastatin 2.5 mg daily. Denies issues with ezetimibe previously but says it was stopped because it did not work to lower his cholesterol. It looks like he was on this from at least 2012 to March 2019 and it is unclear per chart review why this was discontinued. Reports having diarrhea since July 2021 for which he takes budesonide PO.   Current Medications: none Intolerances: rosuvastatin as low as 2.5 mg, atorvastatin, lovastatin (all caused myalgias), previously tried ezetimibe (reports no issues with this but says it was stopped because it didn't work) Risk Factors: Hx ASCVD, HTN, CKD, Fhx ASCVD LDL goal: <55 mg/dL  Diet: endorses a healthy diet  Exercise: prior to CABG often worked out at [employer]  Family History: The patient's family history includes Arthritis in his sister and sister; Cancer in his father; Cirrhosis in his mother and sister; Coronary artery disease in an other family member; Diabetes in his mother; Emphysema in his mother; Heart attack in his father; Heart disease in his brother, father, mother, sister, and sister; Hyperlipidemia in his brother, father, and sister; Hypertension in his father and mother;  Liver disease in an other family member; Macular degeneration in his maternal grandfather, mother, and sister; Obesity in his sister, sister, and sister; Peripheral vascular disease in his father; Prostate cancer in an other family member; Varicose Veins in his father.   Social History: Former smoker (quit 2010)  Labs: 03/24/21: TC 289, TG 80, HDL 51, LDL 222 (no meds) 12/27/17: TC 232, TG 83, HDL 47, LDL 169 (ezetimibe 10 mg daily) 06/21/17: TC 178, TG 76, HDL 47, LDL 116 (ezetimibe 10 mg daily)  Past Medical History:  Diagnosis Date   Anemia    Anticoagulant long-term use    changed from eliquis to praxada 06/ 2022--- mananged by cardiology   BPH with urinary obstruction    CAD (coronary artery disease)    cardiologist--- dr g. taylor--- cath 04-22-2011  moderate LM stenosis, borderline sig pRCA, sig stenosis mid to distal LAD ;  aggressive medical therapy   Chronic diastolic CHF (congestive heart failure) ([place]) 03/2018   followed by cardiology   CKD (chronic kidney disease), stage II    COPD (chronic obstructive pulmonary disease) ([place])    followed by pcp   DOE (dyspnea on exertion)    per pt when walk >100yds,  per pt rides outside bike 4-8 miles daily without sob,  household chores and yard work without sob   Eczema    GERD (  gastroesophageal reflux disease)    Glaucoma, left eye    Heart murmur    Hemorrhoids    History of adenomatous polyp of colon    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 Bryan Lemma--- dx by biopsy 01-28-2021   Macular degeneration of both eyes    Osteoporosis 05/31/2016   PAF (paroxysmal atrial fibrillation) ([place]) 03/2012   cardiologist-- dr g. taylor;  first dx 06/ 2013 AFlutter w/ RVR   Urethral lesion    Vitamin D deficiency     Current Outpatient Medications on File Prior to Visit  Medication Sig Dispense Refill   aspirin EC 81 MG EC tablet Take 1 tablet (81 mg total) by mouth daily. Swallow whole. 30 tablet 11   Baclofen 5 MG TABS Take 5 mg by mouth 3 (three) times daily as needed (for hiccups). 30 tablet 1   brimonidine (ALPHAGAN) 0.2 % ophthalmic solution Place 1 drop into the left eye in the morning and at bedtime.   7   budesonide (ENTOCORT EC) 3 MG 24 hr capsule Take 2 capsules (6 mg total) by mouth daily for 42 days, THEN 1 capsule (3 mg total) daily. (Patient not taking: Reported on 08/12/2021) 160 capsule 0   budesonide (ENTOCORT EC) 3 MG 24 hr capsule Take 3 mg by mouth daily.     clobetasol (TEMOVATE) 0.05 % external solution APPLY 1 APPLICATION TOPICALLY TWICE DAILY 100 mL 3   dabigatran (PRADAXA) 150 MG CAPS capsule Take 1 capsule (150 mg total) by mouth 2 (two) times daily. 180 capsule 3   latanoprost (XALATAN) 0.005 % ophthalmic solution Place 1 drop into the left eye at bedtime.      levalbuterol (XOPENEX HFA) 45 MCG/ACT inhaler INHALE 2 PUFFS BY MOUTH INTO THE LUNGS EVERY 4 HOURS AS NEEDED FOR WHEEZING 45 g 2   MAGnesium-Oxide 400 (240 Mg) MG tablet TAKE 1 TABLET(400 MG) BY MOUTH THREE TIMES DAILY AS NEEDED (Patient taking differently: Take 400 mg by mouth 2 (two) times daily.) 270 tablet 1   metoprolol tartrate (LOPRESSOR) 25 MG tablet TAKE 1 TABLET BY MOUTH  TWICE DAILY (Patient taking differently: Take 25 mg by mouth 2 (two) times daily.) 180 tablet 3   nitroGLYCERIN (NITROSTAT) 0.4 MG SL tablet Place 1 tablet (0.4 mg total) under the tongue  every 5 (five) minutes as needed for chest pain. 30 tablet 0   omeprazole (PRILOSEC OTC) 20 MG tablet Take 20 mg by mouth daily.     oxyCODONE (OXY IR/ROXICODONE) 5 MG immediate release tablet Take 1 tablet (5 mg total) by mouth every 4 (four) hours as needed (pain). 30 tablet 0   propafenone (RYTHMOL) 225 MG tablet TAKE 1 TABLET BY MOUTH  TWICE DAILY (Patient taking differently: Take 225 mg by  mouth 2 (two) times daily.) 180 tablet 2   Ranibizumab (LUCENTIS IO) Inject 1 Dose into the eye once as needed (Macular degeneration).     tamsulosin (FLOMAX) 0.4 MG CAPS capsule Take 0.4 mg by mouth 2 (two) times daily.     No current facility-administered medications on file prior to visit.    Allergies  Allergen Reactions   Albumin (Human) Anaphylaxis   Polymyxin B-Trimethoprim Swelling    Eye drops made eyes swell   Pseudoephedrine Other (See Comments)    Stomach cramps   Codeine Hives, Itching and Rash   Guaiacol Other (See Comments)    Hallucinations   Statins Other (See Comments)    Muscle cramps    Gabapentin Other (See Comments)    Pt unsure of sensitivity   Meloxicam Other (See Comments)    Pt unsure of sensitivity   Peppermint Flavor Other (See Comments)    Severe cramping   Pseudoephedrine-Guaifenesin Nausea And Vomiting    Stomach cramps   Rosuvastatin Calcium Other (See Comments)    Muscle aches   Tapentadol Other (See Comments)    Pt unsure of sensitivity   Ciprofloxacin Hives, Itching, Nausea Only and Rash   Moxifloxacin Nausea Only and Other (See Comments)    Headaches, stomach cramps    Rofecoxib Other (See Comments)    Stomach cramping    Assessment/Plan:  1. Hyperlipidemia - Most recent LDL of 222 is not at goal <55 mg/dL. He has an extensive history of statin intolerance with multiple statins and experienced myalgias even with rosuvastatin 2.5 mg daily. He previously tolerated ezetimibe well and it is not clear why this was discontinued. Given the significant  elevation in LDL and recent CABG, will start PCSK9i, which should lower LDL by ~60% and should bring LDL below 100 mg/dL. Could consider retrying ezetimibe in the future since he reports tolerating it well in the past however given the patient's ongoing diarrhea and risk for GI upset with ezetimibe, will focus on only adding PCSK9i at this time and reassess LDL before making additional changes. PA for Repatha is approved. He currently pays $400/90 day supply of Pradaxa. He has been approved for the [employer] to help cover cost of Repatha. Provided pharmacy with the [place] information and confirmed cost is $0. Called patient to inform him of this and scheduled him for fasting lab work on January 9th to check a lipid panel and LFTs.    Rebbeca Paul, PharmD PGY2 Ambulatory Care Pharmacy Resident 08/19/2021 11:47 AM

## 2021-08-19 ENCOUNTER — Ambulatory Visit (INDEPENDENT_AMBULATORY_CARE_PROVIDER_SITE_OTHER): Payer: Medicare Other | Admitting: Student-PharmD

## 2021-08-19 ENCOUNTER — Other Ambulatory Visit: Payer: Self-pay

## 2021-08-19 DIAGNOSIS — I2584 Coronary atherosclerosis due to calcified coronary lesion: Secondary | ICD-10-CM

## 2021-08-19 DIAGNOSIS — E782 Mixed hyperlipidemia: Secondary | ICD-10-CM

## 2021-08-19 DIAGNOSIS — I2510 Atherosclerotic heart disease of native coronary artery without angina pectoris: Secondary | ICD-10-CM | POA: Diagnosis not present

## 2021-08-19 MED ORDER — REPATHA SURECLICK 140 MG/ML ~~LOC~~ SOAJ: 140.0000 mg | SUBCUTANEOUS | 3 refills | Status: DC

## 2021-08-19 NOTE — Patient Instructions (Addendum)
Nice to see you today!  Keep up the good work with diet and exercise. Aim for a diet full of vegetables, fruit and lean meats (chicken, [place], fish). Try to limit carbs (bread, pasta, sugar, rice) and red meat consumption.  Your goal LDL is less than 55 mg/dL, you're currently at 222 mg/dL  Medication Changes: I am submitting a prior authorization to your insurance for [place]. I will also try to apply for the grant I mentioned that can bring the cost of this to $0. I will call you when I hear back on these and have sent it in to your pharmacy.   Please give [place] a call at [phone] with any questions or concerns.  For Repatha, inject once every other week (any day of the week that works for you) into the fatty skin of stomach, upper outer thigh or back of the arm. Clean the site with soap and warm water or an alcohol pad. Keep the medication in the fridge until you are ready to give your dose, then take it out and let warm up to room temperature for 30-60 mins.

## 2021-08-21 DIAGNOSIS — Z23 Encounter for immunization: Secondary | ICD-10-CM | POA: Diagnosis not present

## 2021-08-22 ENCOUNTER — Encounter (HOSPITAL_COMMUNITY): Payer: Self-pay | Admitting: Emergency Medicine

## 2021-08-22 ENCOUNTER — Emergency Department (HOSPITAL_COMMUNITY)
Admission: EM | Admit: 2021-08-22 | Discharge: 2021-08-23 | Disposition: A | Discharge status: Home / Self Care | Payer: Medicare Other | Attending: Emergency Medicine | Admitting: Emergency Medicine

## 2021-08-22 ENCOUNTER — Emergency Department (HOSPITAL_COMMUNITY): Payer: Medicare Other

## 2021-08-22 ENCOUNTER — Other Ambulatory Visit: Payer: Self-pay

## 2021-08-22 ENCOUNTER — Other Ambulatory Visit: Payer: Self-pay | Admitting: Family Medicine

## 2021-08-22 DIAGNOSIS — I2510 Atherosclerotic heart disease of native coronary artery without angina pectoris: Secondary | ICD-10-CM | POA: Insufficient documentation

## 2021-08-22 DIAGNOSIS — R42 Dizziness and giddiness: Secondary | ICD-10-CM | POA: Insufficient documentation

## 2021-08-22 DIAGNOSIS — J449 Chronic obstructive pulmonary disease, unspecified: Secondary | ICD-10-CM | POA: Diagnosis not present

## 2021-08-22 DIAGNOSIS — N182 Chronic kidney disease, stage 2 (mild): Secondary | ICD-10-CM | POA: Diagnosis not present

## 2021-08-22 DIAGNOSIS — I4891 Unspecified atrial fibrillation: Secondary | ICD-10-CM | POA: Diagnosis not present

## 2021-08-22 DIAGNOSIS — I130 Hypertensive heart and chronic kidney disease with heart failure and stage 1 through stage 4 chronic kidney disease, or unspecified chronic kidney disease: Secondary | ICD-10-CM | POA: Diagnosis not present

## 2021-08-22 DIAGNOSIS — Z951 Presence of aortocoronary bypass graft: Secondary | ICD-10-CM | POA: Diagnosis not present

## 2021-08-22 DIAGNOSIS — Z87891 Personal history of nicotine dependence: Secondary | ICD-10-CM | POA: Insufficient documentation

## 2021-08-22 DIAGNOSIS — I5032 Chronic diastolic (congestive) heart failure: Secondary | ICD-10-CM | POA: Insufficient documentation

## 2021-08-22 DIAGNOSIS — Z79899 Other long term (current) drug therapy: Secondary | ICD-10-CM | POA: Insufficient documentation

## 2021-08-22 DIAGNOSIS — R790 Abnormal level of blood mineral: Secondary | ICD-10-CM

## 2021-08-22 DIAGNOSIS — I499 Cardiac arrhythmia, unspecified: Secondary | ICD-10-CM | POA: Diagnosis not present

## 2021-08-22 DIAGNOSIS — J90 Pleural effusion, not elsewhere classified: Secondary | ICD-10-CM | POA: Diagnosis not present

## 2021-08-22 DIAGNOSIS — Z7982 Long term (current) use of aspirin: Secondary | ICD-10-CM | POA: Insufficient documentation

## 2021-08-22 DIAGNOSIS — Z96651 Presence of right artificial knee joint: Secondary | ICD-10-CM | POA: Diagnosis not present

## 2021-08-22 DIAGNOSIS — I517 Cardiomegaly: Secondary | ICD-10-CM | POA: Diagnosis not present

## 2021-08-22 DIAGNOSIS — R55 Syncope and collapse: Secondary | ICD-10-CM | POA: Diagnosis not present

## 2021-08-22 LAB — CBC WITH DIFFERENTIAL/PLATELET
Abs Immature Granulocytes: 0.07 10*3/uL (ref 0.00–0.07)
Basophils Absolute: 0 10*3/uL (ref 0.0–0.1)
Basophils Relative: 1 %
Eosinophils Absolute: 0.5 10*3/uL (ref 0.0–0.5)
Eosinophils Relative: 6 %
HCT: 27.1 % — ABNORMAL LOW (ref 39.0–52.0)
Hemoglobin: 8.4 g/dL — ABNORMAL LOW (ref 13.0–17.0)
Immature Granulocytes: 1 %
Lymphocytes Relative: 15 %
Lymphs Abs: 1.3 10*3/uL (ref 0.7–4.0)
MCH: 29 pg (ref 26.0–34.0)
MCHC: 31 g/dL (ref 30.0–36.0)
MCV: 93.4 fL (ref 80.0–100.0)
Monocytes Absolute: 0.7 10*3/uL (ref 0.1–1.0)
Monocytes Relative: 8 %
Neutro Abs: 6 10*3/uL (ref 1.7–7.7)
Neutrophils Relative %: 69 %
Platelets: 300 10*3/uL (ref 150–400)
RBC: 2.9 MIL/uL — ABNORMAL LOW (ref 4.22–5.81)
RDW: 15.3 % (ref 11.5–15.5)
WBC: 8.4 10*3/uL (ref 4.0–10.5)
nRBC: 0 % (ref 0.0–0.2)

## 2021-08-22 LAB — TROPONIN I (HIGH SENSITIVITY): Troponin I (High Sensitivity): 11 ng/L (ref <18)

## 2021-08-22 LAB — COMPREHENSIVE METABOLIC PANEL
ALT: 10 U/L (ref 0–44)
AST: 13 U/L — ABNORMAL LOW (ref 15–41)
Albumin: 3.1 g/dL — ABNORMAL LOW (ref 3.5–5.0)
Alkaline Phosphatase: 66 U/L (ref 38–126)
Anion gap: 10 (ref 5–15)
BUN: 20 mg/dL (ref 8–23)
CO2: 24 mmol/L (ref 22–32)
Calcium: 8.9 mg/dL (ref 8.9–10.3)
Chloride: 102 mmol/L (ref 98–111)
Creatinine, Ser: 1.52 mg/dL — ABNORMAL HIGH (ref 0.61–1.24)
GFR, Estimated: 48 mL/min — ABNORMAL LOW (ref >60)
Glucose, Bld: 108 mg/dL — ABNORMAL HIGH (ref 70–99)
Potassium: 4.2 mmol/L (ref 3.5–5.1)
Sodium: 136 mmol/L (ref 135–145)
Total Bilirubin: 0.6 mg/dL (ref 0.3–1.2)
Total Protein: 6.4 g/dL — ABNORMAL LOW (ref 6.5–8.1)

## 2021-08-22 NOTE — ED Provider Notes (Signed)
Emergency Medicine Provider Triage Evaluation Note  Ryan [name] , a 75 y.o. male  was evaluated in triage.  Pt complains of hypotension and dizziness.  Patient states that 1.5 hours prior after ambulating around his house with a rolling walker patient began to feel dizzy and went he checked his blood pressure and his systolic BP in the 75Q.  Patient denied any chest pain, shortness of breath, nausea, vomiting, or diaphoresis.  Patient did take nitroglycerin x2 with no change in his symptoms.  Reports that dizziness has gradually improved over time.  Per chart review patient had CABG on 10/18.  Review of Systems  Positive: Dizziness, hypotension Negative: Chest pain, shortness of breath, nausea, vomiting, diaphoresis, abdominal pain  Physical Exam  BP 128/76 (BP Location: Left Arm)   Pulse 95   Temp 99 F (37.2 C) (Oral)   Resp 16   SpO2 100%  Gen:   Awake, no distress   Resp:  Normal effort  MSK:   Moves extremities without difficulty  Other:    Medical Decision Making  Medically screening exam initiated at 6:52 PM.  Appropriate orders placed.  [name] was informed that the remainder of the evaluation will be completed by another provider, this initial triage assessment does not replace that evaluation, and the importance of remaining in the ED until their evaluation is complete.     Loni Beckwith, PA-C 08/22/21 1854    Milton Ferguson, MD 08/22/21 2209

## 2021-08-22 NOTE — ED Triage Notes (Signed)
Per EMS pt from home.  About 1600 began feeling dizzy.  Home BP read in the 90s.  Afib on EKG 60-120.  Hx of same. Had CABG 5 weeks ago. No CP or SHOB.  BP 130/70, HR 115, RR 17, 97% on RA  Refused IV.  Requesting to go home as he is feeling better.

## 2021-08-23 NOTE — ED Provider Notes (Signed)
[facility] Provider Note   CSN: [id number] Arrival date & time: 08/22/21  1843     History Chief Complaint  Patient presents with   Dizziness    Ryan [name] is a 75 y.o. male.  The history is provided by the patient, the spouse and medical records.  Dizziness Quality:  Lightheadedness Severity:  Mild Onset quality:  Gradual Duration:  1 day Timing:  Rare Progression:  Resolved Chronicity:  New Relieved by:  Nothing Worsened by:  Nothing Ineffective treatments:  None tried Associated symptoms: no chest pain, no headaches, no nausea, no palpitations, no shortness of breath, no syncope, no vomiting and no weakness   Risk factors: heart disease       Past Medical History:  Diagnosis Date   Anemia    Anticoagulant long-term use    changed from eliquis to praxada 06/ 2022--- mananged by cardiology   BPH with urinary obstruction    CAD (coronary artery disease)    cardiologist--- dr g. taylor--- cath 04-22-2011  moderate LM stenosis, borderline sig pRCA, sig stenosis mid to distal LAD ;  aggressive medical therapy   Chronic diastolic CHF (congestive heart failure) ([place]) 03/2018   followed by cardiology   CKD (chronic kidney disease), stage II    COPD (chronic obstructive pulmonary disease) ([place])    followed by pcp   DOE (dyspnea on exertion)    per pt when walk >100yds,  per pt rides outside bike 4-8 miles daily without sob,  household chores and yard work without sob   Eczema    GERD (gastroesophageal reflux disease)    Glaucoma, left eye    Heart murmur    Hemorrhoids    History of adenomatous polyp of colon    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 Bryan Lemma--- dx by biopsy 01-28-2021   Macular degeneration of both eyes    Osteoporosis 05/31/2016   PAF (paroxysmal atrial fibrillation) ([place]) 03/2012   cardiologist-- dr g. 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   Emphysema lung ([place]) 04/10/2020   Elevated troponin 04/09/2020   Hypomagnesemia 04/09/2020   Hypokalemia 04/09/2020  Acute urinary retention 04/09/2020   Statin intolerance 12/19/2019   Vitamin D deficiency 09/18/2019   CHF (congestive heart failure) ([place]) 03/22/2018   PAF (paroxysmal atrial fibrillation) ([place]) 03/22/2018   Edema 03/22/2018   H/O: rheumatic fever 12/27/2017   Hyperglycemia 06/21/2017   Kidney stone 06/21/2017   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   Osteoporosis 05/31/2016   BPH (benign prostatic hyperplasia) 01/20/2015   Erectile dysfunction 01/20/2015   Rectal bleeding 01/20/2015   Preventative health care 01/20/2015   Eczema 08/03/2014   Sleep apnea 03/01/2014   Anticoagulation adequate with anticoagulant therapy 03/01/2014   Pre-syncope 02/26/2014   Abdominal pain 02/26/2014    PAD (peripheral artery disease) ([place]) 02/26/2014   Constipation 03/06/2013   Easy bruising 03/06/2013   History of alcohol abuse 04/15/2012   CAD (coronary atherosclerotic disease) 05/08/2011   Chest pain 03/10/2011   LUMBAR RADICULOPATHY, 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   UNS ADVRS EFF UNS RX MEDICINAL&BIOLOGICAL SBSTNC 11/11/2009   H/O tobacco use, presenting hazards to health 07/29/2009   COPD GOLD I 07/29/2009   Hyperlipidemia, mixed 06/20/2007   Macular degeneration (senile) of retina 06/20/2007   Essential hypertension 06/20/2007   GERD 06/16/2007    Past Surgical History:  Procedure Laterality Date   ANTERIOR CERVICAL DECOMP/DISCECTOMY FUSION  06/08/2002   @[facility] ;  C5--C7   [place]  04/22/2011   moderate left main and RCA stenosis not significant by FFR and IVUS on medical therapy   CARDIOVERSION N/A 04/25/2018   Procedure: CARDIOVERSION;  Surgeon: Skeet Latch, MD;  Location: [facility];  Service: Cardiovascular;  Laterality: N/A;   CATARACT EXTRACTION W/ INTRAOCULAR LENS IMPLANT Bilateral 2021   COLONOSCOPY WITH ESOPHAGOGASTRODUODENOSCOPY (EGD)  01/28/2021   by [email or web address]   CORONARY ARTERY BYPASS GRAFT N/A 07/22/2021   Procedure: 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, ON PUMP, USING LEFT INTERNAL MAMMARY ARTERY AND ENDOSCOPICALLY HARVESTED RIGHT GREATER SAPHENOUS VEIN;  Surgeon: Melrose Nakayama, MD;  Location: [facility];  Service: Open Heart Surgery;  Laterality: N/A;   ENDOVEIN HARVEST OF GREATER SAPHENOUS VEIN  07/22/2021   Procedure: ENDOVEIN HARVEST OF GREATER SAPHENOUS VEIN;  Surgeon: Melrose Nakayama, MD;  Location: [facility];  Service: Open Heart Surgery;;   EXTRACORPOREAL SHOCK WAVE LITHOTRIPSY     x2  1990s   FOOT SURGERY Right 1970   calcification removed from top of foot   KNEE ARTHROSCOPY Right [place]   LEFT HEART CATH AND CORONARY  ANGIOGRAPHY N/A 07/21/2021   Procedure: LEFT HEART CATH AND CORONARY ANGIOGRAPHY;  Surgeon: Lorretta Harp, MD;  Location: [facility];  Service: Cardiovascular;  Laterality: N/A;   TEE WITHOUT CARDIOVERSION N/A 07/22/2021   Procedure: TRANSESOPHAGEAL ECHOCARDIOGRAM (TEE);  Surgeon: Melrose Nakayama, MD;  Location: [facility];  Service: Open Heart Surgery;  Laterality: N/A;   TOTAL KNEE ARTHROPLASTY Right 08/14/2009   @[facility]    TRANSURETHRAL RESECTION OF PROSTATE N/A 07/17/2021   Procedure: TRANSURETHRAL RESECTION OF THE PROSTATE (TURP);  Surgeon: [name], MD;  Location[facility];  Service: Urology;  Laterality: N/A;  1 HR       Family History  Problem Relation Age of Onset   Heart disease Mother    Diabetes Mother    Cirrhosis Mother    Emphysema Mother        never smoked but 2nd hand through her spouse   Hypertension Mother  Macular degeneration Mother    Heart disease Father    Cancer Father        prostate   Hyperlipidemia Father    Hypertension Father    Varicose Veins Father    Heart attack Father    Peripheral vascular disease Father    Heart disease Sister    Arthritis Sister    Hyperlipidemia Sister    Obesity Sister    Macular degeneration Sister    Heart disease Brother        5 stents   Hyperlipidemia Brother    Macular degeneration Maternal Grandfather    Cirrhosis Sister    Obesity Sister    Arthritis Sister    Heart disease Sister    Obesity Sister    Liver disease Other    Prostate cancer Other    Coronary artery disease Other    Colon cancer Neg Hx    Esophageal cancer Neg Hx    Rectal cancer Neg Hx    Stomach cancer Neg Hx     Social History   Tobacco Use   Smoking status: Former    Packs/day: 0.50    Years: 50.00    Pack years: 25.00    Types: Cigarettes    Quit date: 05/01/2009    Years since quitting: 12.3   Smokeless tobacco: Former    Types: Chew    Quit date: 1995  Vaping Use   Vaping Use:  Never used  Substance Use Topics   Alcohol use: Yes    Alcohol/week: 2.0 standard drinks    Types: 2 Cans of beer per week    Comment: 1 beer daily   Drug use: Never    Home Medications Prior to Admission medications   Medication Sig Start Date End Date Taking? Authorizing Provider  aspirin EC 81 MG EC tablet Take 1 tablet (81 mg total) by mouth daily. Swallow whole. 08/01/21   Antony Odea, PA-C  Baclofen 5 MG TABS Take 5 mg by mouth 3 (three) times daily as needed (for hiccups). 08/01/21   Antony Odea, PA-C  brimonidine (ALPHAGAN) 0.2 % ophthalmic solution Place 1 drop into the left eye in the morning and at bedtime.  09/10/17   [provider]  budesonide (ENTOCORT EC) 3 MG 24 hr capsule Take 2 capsules (6 mg total) by mouth daily for 42 days, THEN 1 capsule (3 mg total) daily. Patient not taking: Reported on 08/12/2021 06/19/21 09/29/21  Cirigliano, Vito V, DO  budesonide (ENTOCORT EC) 3 MG 24 hr capsule Take 3 mg by mouth daily.    [provider]  clobetasol (TEMOVATE) 0.05 % external solution APPLY 1 APPLICATION TOPICALLY TWICE DAILY 11/20/16   [name], MD  dabigatran (PRADAXA) 150 MG CAPS capsule Take 1 capsule (150 mg total) by mouth 2 (two) times daily. 05/21/21   Evans Lance, MD  Evolocumab (REPATHA SURECLICK) 154 MG/ML SOAJ Inject 140 mg into the skin every 14 (fourteen) days. 08/19/21   Evans Lance, MD  latanoprost (XALATAN) 0.005 % ophthalmic solution Place 1 drop into the left eye at bedtime.     [provider]  levalbuterol (XOPENEX HFA) 45 MCG/ACT inhaler INHALE 2 PUFFS BY MOUTH INTO THE LUNGS EVERY 4 HOURS AS NEEDED FOR WHEEZING 08/04/21   [name], MD  MAGNESIUM-OXIDE 400 (240 Mg) MG tablet TAKE 1 TABLET(400 MG) BY MOUTH THREE TIMES DAILY AS NEEDED 08/22/21   [name], MD  metoprolol tartrate (LOPRESSOR) 25  MG tablet TAKE 1 TABLET BY MOUTH  TWICE DAILY Patient taking differently: Take 25 mg by mouth 2  (two) times daily. 06/11/21   Evans Lance, MD  nitroGLYCERIN (NITROSTAT) 0.4 MG SL tablet Place 1 tablet (0.4 mg total) under the tongue every 5 (five) minutes as needed for chest pain. 04/14/20   Allie Bossier, MD  omeprazole (PRILOSEC OTC) 20 MG tablet Take 20 mg by mouth daily.    [provider]  oxyCODONE (OXY IR/ROXICODONE) 5 MG immediate release tablet Take 1 tablet (5 mg total) by mouth every 4 (four) hours as needed (pain). 08/01/21   Antony Odea, PA-C  propafenone (RYTHMOL) 225 MG tablet TAKE 1 TABLET BY MOUTH  TWICE DAILY Patient taking differently: Take 225 mg by mouth 2 (two) times daily. 11/12/20   Evans Lance, MD  Ranibizumab (LUCENTIS IO) Inject 1 Dose into the eye once as needed (Macular degeneration).    [provider]  tamsulosin (FLOMAX) 0.4 MG CAPS capsule Take 0.4 mg by mouth 2 (two) times daily.    [provider]    Allergies    Albumin (human), Polymyxin b-trimethoprim, Pseudoephedrine, Codeine, Guaiacol, Statins, Gabapentin, Meloxicam, Peppermint flavor, Pseudoephedrine-guaifenesin, Rosuvastatin calcium, Tapentadol, Ciprofloxacin, Moxifloxacin, and Rofecoxib  Review of Systems   Review of Systems  Constitutional:  Positive for fatigue (resolved). Negative for chills, diaphoresis and fever.  HENT:  Negative for congestion.   Eyes:  Negative for visual disturbance.  Respiratory:  Negative for chest tightness, shortness of breath and wheezing.   Cardiovascular:  Negative for chest pain, palpitations and syncope.  Gastrointestinal:  Negative for abdominal pain, nausea and vomiting.  Musculoskeletal:  Negative for back pain.  Skin:  Positive for wound (healing chest wound). Negative for rash.  Neurological:  Negative for dizziness, syncope, speech difficulty, weakness, light-headedness and headaches.  Psychiatric/Behavioral:  Negative for agitation.   All other systems reviewed and are negative.  Physical Exam Updated Vital  Signs BP 120/81 (BP Location: Left Arm)   Pulse (!) 121   Temp 97.9 F (36.6 C) (Oral)   Resp 17   SpO2 99%   Physical Exam Vitals and nursing note reviewed.  Constitutional:      General: He is not in acute distress.    Appearance: He is well-developed. He is not ill-appearing, toxic-appearing or diaphoretic.  HENT:     Head: Normocephalic and atraumatic.     Mouth/Throat:     Mouth: Mucous membranes are dry.     Pharynx: No oropharyngeal exudate or posterior oropharyngeal erythema.  Eyes:     Conjunctiva/sclera: Conjunctivae normal.     Pupils: Pupils are equal, round, and reactive to light.  Cardiovascular:     Rate and Rhythm: Normal rate and regular rhythm.     Heart sounds: No murmur heard. Pulmonary:     Effort: Pulmonary effort is normal. No respiratory distress.     Breath sounds: Normal breath sounds. No wheezing, rhonchi or rales.  Chest:     Chest wall: No tenderness.  Abdominal:     General: Abdomen is flat. There is no distension.     Palpations: Abdomen is soft.     Tenderness: There is no abdominal tenderness. There is no guarding or rebound.  Musculoskeletal:        General: No swelling or tenderness.     Cervical back: Neck supple. No tenderness.  Skin:    General: Skin is warm and dry.     Capillary Refill: Capillary  refill takes less than 2 seconds.     Findings: No erythema.  Neurological:     General: No focal deficit present.     Mental Status: He is alert.     Sensory: No sensory deficit.     Motor: No weakness.  Psychiatric:        Mood and Affect: Mood normal.    ED Results / Procedures / Treatments   Labs (all labs ordered are listed, but only abnormal results are displayed) Labs Reviewed  COMPREHENSIVE METABOLIC PANEL - Abnormal; Notable for the following components:      Result Value   Glucose, Bld 108 (*)    Creatinine, Ser 1.52 (*)    Total Protein 6.4 (*)    Albumin 3.1 (*)    AST 13 (*)    GFR, Estimated 48 (*)    All other  components within normal limits  CBC WITH DIFFERENTIAL/PLATELET - Abnormal; Notable for the following components:   RBC 2.90 (*)    Hemoglobin 8.4 (*)    HCT 27.1 (*)    All other components within normal limits  TROPONIN I (HIGH SENSITIVITY)  TROPONIN I (HIGH SENSITIVITY)    EKG EKG Interpretation  Date/Time:  Friday August 22 2021 18:52:54 EST Ventricular Rate:  97 PR Interval:    QRS Duration: 140 QT Interval:  396 QTC Calculation: 502 R Axis:   11 Text Interpretation: Atrial fibrillation Right bundle branch block Abnormal ECG When compared to prior, afib present. NO STEMI Confirmed by Antony Blackbird [phone]) on 08/23/2021 7:30:57 AM  Radiology DG Chest 1 View  Result Date: 08/22/2021 CLINICAL DATA:  Near syncope. EXAM: CHEST  1 VIEW COMPARISON:  Chest x-ray 07/31/2021. FINDINGS: Sternotomy wires are again noted. The heart is mildly enlarged, unchanged. There are minimal atelectatic changes in the lung bases. There is a small left pleural effusion. There is no pneumothorax or acute fracture. Cervical spinal fusion plate is present. IMPRESSION: 1. Stable small left pleural effusion. 2. Stable mild cardiomegaly. Electronically Signed   By: Ronney Asters M.D.   On: 08/22/2021 19:32    Procedures Procedures   Medications Ordered in ED Medications - No data to display  ED Course  I have reviewed the triage vital signs and the nursing notes.  Pertinent labs & imaging results that were available during my care of the patient were reviewed by me and considered in my medical decision making (see chart for details).    MDM Rules/Calculators/A&P                           EASTEN MACEACHERN is a 75 y.o. male with a past medical history significant for hypertension, hyperlipidemia, COPD, CAD status post CABG last month, CHF, prior stroke, and paroxysmal atrial fibrillation on Pradaxa who presents with episode of lightheadedness and soft pressures at home.  He reports this afternoon,  he felt slightly lightheaded and checked a home blood pressure that was around 90 systolic.  Due to his return precautions by his cardiologist, he wanted to come get evaluated.  He has been here for around 13 hours waiting for the results of his work-up in the emergency department without any symptoms of lightheadedness, palpitations, chest pain, or shortness of breath.  He reports feeling completely normal now aside from feeling tired and wanting to go home.  He does not want further work-up here.  He denies any recent fevers, chills, chest pain, shortness of breath, nausea,  vomiting, constipation, diarrhea, or urinary changes.  He is hungry and has not been eat or drink all night.  On exam, lungs clear and chest nontender.  He has a healing scar from his CABG in place that is well-appearing and no evidence of infection.  Abdomen nontender.  Good pulses in extremities.  Legs are nontender and nonedematous.  He reports his right leg is all slightly more edematous than his left due to previous knee surgery but he reports it is no different than baseline.  Mucous membranes do appear slightly dry.  EKG did not show STEMI.  As patient admitted with 13 hours of any new symptoms and with work-up results appear similar to prior, it is reasonable that the patient would like to go home.  EKG showed A. Fib which he reports he is chronically in and that transiently can go faster or slower.  He suspects he is slightly dehydrated due to not eating or drinking overnight and we let him have some food and drink.  We discussed getting a delta troponin but he did not want to get that or wait.  Otherwise given his well appearance and him saying that he is seeing his cardiologist in several days, we felt was reasonable to let him go home.  Suspect mild dehydration is the cause of his transient lightheadedness with a blood pressure of 90 however as it is resolved and he is well-appearing we feel he is safe for discharge  home.  Patient discharged in good condition while understanding return precautions and follow-up instructions.    Final Clinical Impression(s) / ED Diagnoses Final diagnoses:  None    Clinical Impression: 1. Lightheadedness     Disposition: Discharge  Condition: Good  I have discussed the results, Dx and Tx plan with the pt(& family if present). He/she/they expressed understanding and agree(s) with the plan. Discharge instructions discussed at great length. Strict return precautions discussed and pt &/or family have verbalized understanding of the instructions. No further questions at time of discharge.    New Prescriptions   No medications on file    Follow Up: your cardioligist in several days     [facility] [address] [id number] [place], [place], [place] [address] [phone]         Wiliam Cauthorn, Gwenyth Allegra, MD 08/23/21 [phone]

## 2021-08-23 NOTE — Discharge Instructions (Signed)
Your history, exam, work-up today are consistent with some transient lightheadedness and a episode of soft blood pressures while at home however you have demonstrated stability for over 13 hours here and your work-up was overall similar to prior and reassuring.  We had a discussion about doing further work-up here however as you have not had symptoms for so long and are scheduled to see her cardiologist in several days, we feel it is reasonable to let you go home after our shared decision-making conversation.  If any symptoms were to change or worsen, please return to the nearest emergency department.  Please rest and stay hydrated.  Please continue your home medications.

## 2021-08-25 ENCOUNTER — Other Ambulatory Visit: Payer: Self-pay | Admitting: Thoracic Surgery (Cardiothoracic Vascular Surgery)

## 2021-08-25 ENCOUNTER — Telehealth: Payer: Self-pay | Admitting: Family Medicine

## 2021-08-25 DIAGNOSIS — Z951 Presence of aortocoronary bypass graft: Secondary | ICD-10-CM

## 2021-08-25 NOTE — Telephone Encounter (Signed)
Home Health stated they wanted to leave a message about a nurse evaluation for pt because bp is way off. She stated pt went to er last week for this. Please advise.

## 2021-08-26 ENCOUNTER — Ambulatory Visit
Admission: RE | Admit: 2021-08-26 | Discharge: 2021-08-26 | Disposition: A | Discharge status: Home / Self Care | Payer: Medicare Other | Source: Ambulatory Visit | Attending: Thoracic Surgery (Cardiothoracic Vascular Surgery) | Admitting: Thoracic Surgery (Cardiothoracic Vascular Surgery)

## 2021-08-26 ENCOUNTER — Other Ambulatory Visit: Payer: Self-pay

## 2021-08-26 ENCOUNTER — Ambulatory Visit (INDEPENDENT_AMBULATORY_CARE_PROVIDER_SITE_OTHER): Payer: Self-pay | Admitting: Thoracic Surgery (Cardiothoracic Vascular Surgery)

## 2021-08-26 VITALS — BP 126/77 | HR 85 | Resp 20 | Ht 64.0 in | Wt 161.0 lb

## 2021-08-26 DIAGNOSIS — Z951 Presence of aortocoronary bypass graft: Secondary | ICD-10-CM

## 2021-08-26 MED ORDER — OXYCODONE HCL 5 MG PO TABS: 5.0000 mg | ORAL_TABLET | Freq: Four times a day (QID) | ORAL | 0 refills | Status: DC | PRN

## 2021-08-26 NOTE — Telephone Encounter (Signed)
Verbal was given

## 2021-08-26 NOTE — Progress Notes (Signed)
[address]       [place] [address]             [phone]      HPI: [name] returns for scheduled postoperative follow-up visit after coronary bypass grafting.  Ryan [name] is a 75 year old man with a history of CAD, hypertension, CKD, COPD, chronic diastolic congestive heart failure, paroxysmal atrial fibrillation, and BPH.  He presented with unstable chest pain and was found to have left main and three-vessel disease.  He underwent emergency coronary bypass grafting x4 on 07/22/2021.  He had a lot of issues with nausea and hiccups postoperatively.  He did have atrial fibrillation postop.  He finally went home on postoperative day #10.  He was back in the emergency room over the weekend after he noted his blood pressure was low.  At the time he was seen by an ER doctor he was feeling well and he went home.  He complains of pain left posterior chest with coughing.  He has a more constant right subscapular pain.  He has trouble sleeping on his back.  He has been taking 1 oxycodone at night before he goes to bed to help him sleep.   Past Medical History:  Diagnosis Date   Anemia    Anticoagulant long-term use    changed from eliquis to praxada 06/ 2022--- mananged by cardiology   BPH with urinary obstruction    CAD (coronary artery disease)    cardiologist--- dr g. taylor--- cath 04-22-2011  moderate LM stenosis, borderline sig pRCA, sig stenosis mid to distal LAD ;  aggressive medical therapy   Chronic diastolic CHF (congestive heart failure) ([place]) 03/2018   followed by cardiology   CKD (chronic kidney disease), stage II    COPD (chronic obstructive pulmonary disease) ([facility] [facility])    followed by pcp   DOE (dyspnea on exertion)    per pt when walk >100yds,  per pt rides outside bike 4-8 miles daily without sob,  household chores and yard work without sob   Eczema    GERD (gastroesophageal reflux disease)    Glaucoma, left eye    Heart murmur    Hemorrhoids     History of adenomatous polyp of colon    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 Bryan Lemma--- dx by biopsy 01-28-2021   Macular degeneration of both eyes    Osteoporosis 05/31/2016   PAF (paroxysmal atrial fibrillation) ([facility]) 03/2012   cardiologist-- dr g. taylor;  first dx 06/ 2013 AFlutter w/ RVR   Urethral lesion    Vitamin D deficiency      Current Outpatient Medications  Medication Sig Dispense Refill   aspirin EC 81 MG EC tablet Take 1 tablet (81 mg total) by mouth  daily. Swallow whole. 30 tablet 11   Baclofen 5 MG TABS Take 5 mg by mouth 3 (three) times daily as needed (for hiccups). 30 tablet 1   brimonidine (ALPHAGAN) 0.2 % ophthalmic solution Place 1 drop into the left eye in the morning and at bedtime.   7   budesonide (ENTOCORT EC) 3 MG 24 hr capsule Take 2 capsules (6 mg total) by mouth daily for 42 days, THEN 1 capsule (3 mg total) daily. 160 capsule 0   budesonide (ENTOCORT EC) 3 MG 24 hr capsule Take 3 mg by mouth daily.     clobetasol (TEMOVATE) 0.05 % external solution APPLY 1 APPLICATION TOPICALLY TWICE DAILY 100 mL 3   dabigatran (PRADAXA) 150 MG CAPS capsule Take 1 capsule (150 mg total) by mouth 2 (two) times daily. 180 capsule 3   Evolocumab (REPATHA SURECLICK) 620 MG/ML SOAJ Inject 140 mg into the skin every 14 (fourteen) days. 6 mL 3   latanoprost (XALATAN) 0.005 % ophthalmic solution  Place 1 drop into the left eye at bedtime.      levalbuterol (XOPENEX HFA) 45 MCG/ACT inhaler INHALE 2 PUFFS BY MOUTH INTO THE LUNGS EVERY 4 HOURS AS NEEDED FOR WHEEZING 45 g 2   MAGNESIUM-OXIDE 400 (240 Mg) MG tablet TAKE 1 TABLET(400 MG) BY MOUTH THREE TIMES DAILY AS NEEDED 90 tablet 0   metoprolol tartrate (LOPRESSOR) 25 MG tablet TAKE 1 TABLET BY MOUTH  TWICE DAILY (Patient taking differently: Take 25 mg by mouth 2 (two) times daily.) 180 tablet 3   nitroGLYCERIN (NITROSTAT) 0.4 MG SL tablet Place 1 tablet (0.4 mg total) under the tongue every 5 (five) minutes as needed for chest pain. 30 tablet 0   omeprazole (PRILOSEC OTC) 20 MG tablet Take 20 mg by mouth daily.     propafenone (RYTHMOL) 225 MG tablet TAKE 1 TABLET BY MOUTH  TWICE DAILY (Patient taking differently: Take 225 mg by mouth 2 (two) times daily.) 180 tablet 2   Ranibizumab (LUCENTIS IO) Inject 1 Dose into the eye once as needed (Macular degeneration).     tamsulosin (FLOMAX) 0.4 MG CAPS capsule Take 0.4 mg by mouth 2 (two) times daily.     oxyCODONE (OXY IR/ROXICODONE) 5 MG immediate release tablet Take 1 tablet (5 mg total) by mouth every 6 (six) hours as needed (pain). 20 tablet 0   No current facility-administered medications for this visit.    Physical Exam BP 126/77   Pulse 85   Resp 20   Ht 5\' 4"  (1.626 m)   Wt 161 lb (73 kg)   SpO2 97% Comment: RA  BMI 27.56 kg/m  75 year old man in no acute distress Alert and oriented x3 with no focal deficits Lungs clear with equal breath sounds bilaterally Cardiac regular rate and rhythm Sternum stable, incision well-healed Incisions healing well  Diagnostic Tests: I personally reviewed his chest x-ray.  Shows postoperative changes with no active disease.  Impression: Ryan [name] is a 75 year old man with a history of CAD, hypertension, CKD, COPD, chronic diastolic congestive heart failure, paroxysmal atrial fibrillation, and BPH.  He presented with unstable chest pain  and was found to have left main and three-vessel disease.  He had had a urologic procedure about a week prior to that.  He underwent emergency coronary bypass grafting on 07/22/2021.  Postoperative course was slowed by paroxysmal atrial fibrillation, nausea, and hiccups.  No recurrent angina.  Incisions healing well.  Still has postoperative pain.  Will represcribe oxycodone 5 mg p.o.  every 6 hours as needed, 20 tablets, no refills  Patient anything over 10 pounds for another 2 weeks.  He may drive on a limited basis.  Advised him to wait till after December 1 to have any additional urologic procedures.  Hyperlipidemia-recently started on Repatha by Dr. Lovena Le  Plan: Continue baby aspirin daily Represcribed oxycodone 5 mg p.o. every 6 hours as needed 20 tablets, no refills Okay for any urologic procedures after December 1. Return in 6 weeks to check on progress   Melrose Nakayama, MD [facility] [phone]

## 2021-08-26 NOTE — Telephone Encounter (Signed)
Lvm to call back

## 2021-09-01 NOTE — Progress Notes (Signed)
GU Location of Tumor / Histology: Prostate Ca  If Prostate Cancer, PSA is (1.8 as of 02/2021)  Biopsies   Past/Anticipated interventions by urology, if any:   Past/Anticipated interventions by medical oncology, if any:   Weight changes, if any:   Lost 14 lbs. Over a month ago.  IPSS:  14 SHIM:  19  Bowel/Bladder complaints, if any:  No bowel or bladder issue.  Occasional diarrhea is taking medications for it.  Nausea/Vomiting, if any:   No  Pain issues, if any:  0/10  SAFETY ISSUES: Prior radiation?  No Pacemaker/ICD? No Possible current pregnancy?  Male Is the patient on methotrexate?  No  Current Complaints / other details:  Want to learn more about treatment option.

## 2021-09-02 ENCOUNTER — Ambulatory Visit
Admission: RE | Admit: 2021-09-02 | Discharge: 2021-09-02 | Disposition: A | Discharge status: Home / Self Care | Payer: Medicare Other | Source: Ambulatory Visit | Attending: Radiation Oncology | Admitting: Radiation Oncology

## 2021-09-02 ENCOUNTER — Other Ambulatory Visit: Payer: Self-pay

## 2021-09-02 VITALS — BP 114/81 | HR 113 | Temp 97.4°F | Resp 18 | Ht 64.0 in | Wt 162.1 lb

## 2021-09-02 DIAGNOSIS — Z87442 Personal history of urinary calculi: Secondary | ICD-10-CM | POA: Insufficient documentation

## 2021-09-02 DIAGNOSIS — N182 Chronic kidney disease, stage 2 (mild): Secondary | ICD-10-CM | POA: Diagnosis not present

## 2021-09-02 DIAGNOSIS — I4891 Unspecified atrial fibrillation: Secondary | ICD-10-CM | POA: Diagnosis not present

## 2021-09-02 DIAGNOSIS — M810 Age-related osteoporosis without current pathological fracture: Secondary | ICD-10-CM | POA: Insufficient documentation

## 2021-09-02 DIAGNOSIS — I5032 Chronic diastolic (congestive) heart failure: Secondary | ICD-10-CM | POA: Diagnosis not present

## 2021-09-02 DIAGNOSIS — R338 Other retention of urine: Secondary | ICD-10-CM | POA: Insufficient documentation

## 2021-09-02 DIAGNOSIS — Z7952 Long term (current) use of systemic steroids: Secondary | ICD-10-CM | POA: Diagnosis not present

## 2021-09-02 DIAGNOSIS — Z86010 Personal history of colonic polyps: Secondary | ICD-10-CM | POA: Insufficient documentation

## 2021-09-02 DIAGNOSIS — Z923 Personal history of irradiation: Secondary | ICD-10-CM | POA: Insufficient documentation

## 2021-09-02 DIAGNOSIS — R319 Hematuria, unspecified: Secondary | ICD-10-CM | POA: Insufficient documentation

## 2021-09-02 DIAGNOSIS — C61 Malignant neoplasm of prostate: Secondary | ICD-10-CM | POA: Insufficient documentation

## 2021-09-02 DIAGNOSIS — K219 Gastro-esophageal reflux disease without esophagitis: Secondary | ICD-10-CM | POA: Diagnosis not present

## 2021-09-02 DIAGNOSIS — I130 Hypertensive heart and chronic kidney disease with heart failure and stage 1 through stage 4 chronic kidney disease, or unspecified chronic kidney disease: Secondary | ICD-10-CM | POA: Insufficient documentation

## 2021-09-02 DIAGNOSIS — F17210 Nicotine dependence, cigarettes, uncomplicated: Secondary | ICD-10-CM | POA: Diagnosis not present

## 2021-09-02 DIAGNOSIS — J90 Pleural effusion, not elsewhere classified: Secondary | ICD-10-CM | POA: Insufficient documentation

## 2021-09-02 DIAGNOSIS — Z7982 Long term (current) use of aspirin: Secondary | ICD-10-CM | POA: Insufficient documentation

## 2021-09-02 DIAGNOSIS — Z7901 Long term (current) use of anticoagulants: Secondary | ICD-10-CM | POA: Diagnosis not present

## 2021-09-02 DIAGNOSIS — K4090 Unilateral inguinal hernia, without obstruction or gangrene, not specified as recurrent: Secondary | ICD-10-CM | POA: Diagnosis not present

## 2021-09-02 DIAGNOSIS — K5730 Diverticulosis of large intestine without perforation or abscess without bleeding: Secondary | ICD-10-CM | POA: Diagnosis not present

## 2021-09-02 DIAGNOSIS — Z8673 Personal history of transient ischemic attack (TIA), and cerebral infarction without residual deficits: Secondary | ICD-10-CM | POA: Diagnosis not present

## 2021-09-02 DIAGNOSIS — J432 Centrilobular emphysema: Secondary | ICD-10-CM | POA: Insufficient documentation

## 2021-09-02 DIAGNOSIS — I2510 Atherosclerotic heart disease of native coronary artery without angina pectoris: Secondary | ICD-10-CM | POA: Insufficient documentation

## 2021-09-02 DIAGNOSIS — N401 Enlarged prostate with lower urinary tract symptoms: Secondary | ICD-10-CM | POA: Insufficient documentation

## 2021-09-02 NOTE — Progress Notes (Signed)
Radiation Oncology         [phone] ________________________________  Initial Outpatient Consultation  Name: Ryan [name] MRN: [id number]  Date: 09/02/2021  DOB: [birth year]  [id number], Bonnita Levan, MD  [name], MD   REFERRING PHYSICIAN: [name], MD  DIAGNOSIS: 75 y.o. 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 Malignant neoplasm of prostate ([place])  C61       HISTORY OF PRESENT ILLNESS: Ryan [name] is a 75 y.o. male with a diagnosis of prostate cancer. He has been followed by Dr. Diona Fanti for BPH since at least 2019 and worsening LUTS since 07/2020. His PSA has fluctuated but overall remained in the normal range, last was 1.8 in 02/2021. Due to his worsening symptoms despite tamsulosin, he proceeded to TURP on 07/17/21. Pathology from the procedure revealed Gleason 5+5 prostatic adenocarcinoma, involving prostatic ducts and 80-90% of the resected tissue.  He proceeded to PSMA PET scan on 08/15/21 showing: asymmetric increased uptake is noted within the left side of prostate gland; no signs of tracer-avid solid organ or nodal metastasis; no abnormal uptake within osseous structures.  His post-procedure course was complicated by hematuria then urinary retention. He was admitted and subsequently developed chest tightness/fullness. He ultimately required emergent CABG x4 on 07/22/21 under Dr. Roxan Hockey.  The patient reviewed the biopsy results with his urologist and he has kindly been referred today for discussion of potential radiation treatment options.   PREVIOUS RADIATION THERAPY: No  PAST MEDICAL HISTORY:  Past Medical History:  Diagnosis Date   Anemia    Anticoagulant long-term use    changed from eliquis to praxada 06/ 2022--- mananged by cardiology   BPH with urinary obstruction    CAD (coronary artery disease)    cardiologist--- dr g. taylor--- cath 04-22-2011   moderate LM stenosis, borderline sig pRCA, sig stenosis mid to distal LAD ;  aggressive medical therapy   Chronic diastolic CHF (congestive heart failure) ([place]) 03/2018   followed by cardiology   CKD (chronic kidney disease), stage II    COPD (chronic obstructive pulmonary disease) ([place])    followed by pcp   DOE (dyspnea on exertion)    per pt when walk >100yds,  per pt rides outside bike 4-8 miles daily without sob,  household chores and yard work without sob   Eczema    GERD (gastroesophageal reflux disease)    Glaucoma, left eye    Heart murmur    Hemorrhoids    History of adenomatous polyp of colon    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 Bryan Lemma--- dx by biopsy 01-28-2021   Macular degeneration of both eyes    Osteoporosis  05/31/2016   PAF (paroxysmal atrial fibrillation) ([place]) 03/2012   cardiologist-- dr g. taylor;  first dx 06/ 2013 AFlutter w/ RVR   Urethral lesion    Vitamin D deficiency       PAST SURGICAL HISTORY: Past Surgical History:  Procedure Laterality Date   ANTERIOR CERVICAL DECOMP/DISCECTOMY FUSION  06/08/2002   @[facility] ;  C5--C7   [place]  04/22/2011   moderate left main and RCA stenosis not significant by FFR and IVUS on medical therapy   CARDIOVERSION N/A 04/25/2018   Procedure: CARDIOVERSION;  Surgeon: Skeet Latch, MD;  Location: [facility];  Service: Cardiovascular;  Laterality: N/A;   CATARACT EXTRACTION W/ INTRAOCULAR LENS IMPLANT Bilateral 2021   COLONOSCOPY WITH ESOPHAGOGASTRODUODENOSCOPY (EGD)  01/28/2021   by NATFTD   CORONARY ARTERY BYPASS GRAFT N/A 07/22/2021   Procedure: 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, ON PUMP, USING LEFT INTERNAL MAMMARY ARTERY AND ENDOSCOPICALLY HARVESTED RIGHT GREATER SAPHENOUS VEIN;  Surgeon: Melrose Nakayama, MD;  Location: [facility];  Service: Open Heart Surgery;  Laterality: N/A;   ENDOVEIN HARVEST OF GREATER SAPHENOUS VEIN  07/22/2021   Procedure: ENDOVEIN HARVEST OF GREATER SAPHENOUS VEIN;  Surgeon: Melrose Nakayama, MD;  Location: [facility];  Service: Open Heart Surgery;;   EXTRACORPOREAL SHOCK WAVE LITHOTRIPSY     x2  1990s   FOOT SURGERY Right 1970   calcification removed from top of foot   KNEE ARTHROSCOPY Right [place]   LEFT HEART CATH AND CORONARY ANGIOGRAPHY N/A 07/21/2021   Procedure: LEFT HEART CATH AND CORONARY ANGIOGRAPHY;  Surgeon: Lorretta Harp, MD;  Location: [facility];  Service: Cardiovascular;  Laterality: N/A;   TEE WITHOUT CARDIOVERSION N/A 07/22/2021   Procedure: TRANSESOPHAGEAL ECHOCARDIOGRAM (TEE);  Surgeon: Melrose Nakayama, MD;  Location: [facility];  Service: Open Heart Surgery;  Laterality: N/A;   TOTAL KNEE ARTHROPLASTY Right 08/14/2009   @[facility]    TRANSURETHRAL RESECTION OF PROSTATE N/A 07/17/2021   Procedure: TRANSURETHRAL RESECTION OF THE PROSTATE (TURP);  Surgeon: [name], MD;  Location: [facility];  Service: Urology;  Laterality: N/A;  1 HR    FAMILY HISTORY:  Family History  Problem Relation Age of Onset   Heart disease Mother    Diabetes Mother    Cirrhosis Mother    Emphysema Mother        never smoked but 2nd hand through her spouse   Hypertension Mother    Macular degeneration Mother    Heart disease Father    Cancer Father        prostate    Hyperlipidemia Father    Hypertension Father    Varicose Veins Father    Heart attack Father    Peripheral vascular disease Father    Heart disease Sister    Arthritis Sister    Hyperlipidemia Sister    Obesity Sister    Macular degeneration Sister    Heart disease Brother        5 stents   Hyperlipidemia Brother    Macular degeneration Maternal Grandfather    Cirrhosis Sister    Obesity Sister    Arthritis Sister    Heart disease Sister    Obesity Sister    Liver disease Other    Prostate cancer Other    Coronary artery disease Other    Colon cancer Neg Hx    Esophageal cancer Neg Hx    Rectal cancer Neg  Hx    Stomach cancer Neg Hx     SOCIAL HISTORY:  Social History   Socioeconomic History   Marital status: Married    Spouse name: Thayer Headings   Number of children: 0   Years of education: Not on file   Highest education level: Not on file  Occupational History   Occupation: retired    Fish farm manager: RETIRED  Tobacco Use   Smoking status: Former    Packs/day: 0.50    Years: 50.00    Pack years: 25.00    Types: Cigarettes    Quit date: 05/01/2009    Years since quitting: 12.3   Smokeless tobacco: Former    Types: Chew    Quit date: 1995  Vaping Use   Vaping Use: Never used  Substance and Sexual Activity   Alcohol use: Yes    Alcohol/week: 2.0 standard drinks    Types: 2 Cans of beer per week    Comment: 1 beer daily   Drug use: Never   Sexual activity: Yes    Comment: lives with wife, no dietary restrictions  Other Topics Concern   Not on file  Social History Narrative   Lives with wife   Social Determinants of Health   Financial Resource Strain: Not on file  Food Insecurity: Not on file  Transportation Needs: Not on file  Physical Activity: Not on file  Stress: Not on file  Social Connections: Not on file  Intimate Partner Violence: Not on file    ALLERGIES: Albumin (human), Polymyxin b-trimethoprim, Pseudoephedrine, Codeine, Guaiacol, Statins,  Gabapentin, Meloxicam, Peppermint flavor, Pseudoephedrine-guaifenesin, Rosuvastatin calcium, Tapentadol, Ciprofloxacin, Moxifloxacin, and Rofecoxib  MEDICATIONS:  Current Outpatient Medications  Medication Sig Dispense Refill   aspirin EC 81 MG EC tablet Take 1 tablet (81 mg total) by mouth daily. Swallow whole. 30 tablet 11   Baclofen 5 MG TABS Take 5 mg by mouth 3 (three) times daily as needed (for hiccups). 30 tablet 1   brimonidine (ALPHAGAN) 0.2 % ophthalmic solution Place 1 drop into the left eye in the morning and at bedtime.   7   budesonide (ENTOCORT EC) 3 MG 24 hr capsule Take 2 capsules (6 mg total) by mouth daily for 42 days, THEN 1 capsule (3 mg total) daily. 160 capsule 0   budesonide (ENTOCORT EC) 3 MG 24 hr capsule Take 3 mg by mouth daily.     clobetasol (TEMOVATE) 0.05 % external solution APPLY 1 APPLICATION TOPICALLY TWICE DAILY 100 mL 3   dabigatran (PRADAXA) 150 MG CAPS capsule Take 1 capsule (150 mg total) by mouth 2 (two) times daily. 180 capsule 3   Evolocumab (REPATHA SURECLICK) 409 MG/ML SOAJ Inject 140 mg into the skin every 14 (fourteen) days. 6 mL 3   latanoprost (XALATAN) 0.005 % ophthalmic solution Place 1 drop into the left eye at bedtime.      levalbuterol (XOPENEX HFA) 45 MCG/ACT inhaler INHALE 2 PUFFS BY MOUTH INTO THE LUNGS EVERY 4 HOURS AS NEEDED FOR WHEEZING 45 g 2   MAGNESIUM-OXIDE 400 (240 Mg) MG tablet TAKE 1 TABLET(400 MG) BY MOUTH THREE TIMES DAILY AS NEEDED 90 tablet 0   metoprolol tartrate (LOPRESSOR) 25 MG tablet TAKE 1 TABLET BY MOUTH  TWICE DAILY (Patient taking differently: Take 25 mg by mouth 2 (two) times daily.) 180 tablet 3   nitroGLYCERIN (NITROSTAT) 0.4 MG SL tablet Place 1 tablet (0.4 mg total) under the tongue every 5 (five) minutes as needed for chest pain. 30 tablet 0  omeprazole (PRILOSEC OTC) 20 MG tablet Take 20 mg by mouth daily.     oxyCODONE (OXY IR/ROXICODONE) 5 MG immediate release tablet Take 1 tablet (5 mg total) by mouth every  6 (six) hours as needed (pain). 20 tablet 0   propafenone (RYTHMOL) 225 MG tablet TAKE 1 TABLET BY MOUTH  TWICE DAILY (Patient taking differently: Take 225 mg by mouth 2 (two) times daily.) 180 tablet 2   Ranibizumab (LUCENTIS IO) Inject 1 Dose into the eye once as needed (Macular degeneration).     tamsulosin (FLOMAX) 0.4 MG CAPS capsule Take 0.4 mg by mouth 2 (two) times daily.     No current facility-administered medications for this encounter.    REVIEW OF SYSTEMS:  On review of systems, the patient reports that he is doing well overall. He denies any chest pain, shortness of breath, cough, fevers, chills, night sweats, unintended weight changes. He denies any bowel disturbances, and denies abdominal pain, nausea or vomiting. He denies any new musculoskeletal or joint aches or pains. His IPSS was 14, indicating moderate urinary symptoms. His SHIM was 19, indicating he has mild erectile dysfunction. A complete review of systems is obtained and is otherwise negative.    PHYSICAL EXAM:  Wt Readings from Last 3 Encounters:  09/02/21 162 lb 2 oz (73.5 kg)  08/26/21 161 lb (73 kg)  08/12/21 171 lb (77.6 kg)   Temp Readings from Last 3 Encounters:  09/02/21 (!) 97.4 F (36.3 C) (Temporal)  08/23/21 97.9 F (36.6 C) (Oral)  08/01/21 98.5 F (36.9 C) (Oral)   BP Readings from Last 3 Encounters:  09/02/21 114/81  08/26/21 126/77  08/23/21 (!) 125/104   Pulse Readings from Last 3 Encounters:  09/02/21 (!) 113  08/26/21 85  08/23/21 (!) 116    /10  In general this is a well appearing man in no acute distress. He's alert and oriented x4 and appropriate throughout the examination. Cardiopulmonary assessment is negative for acute distress, and he exhibits normal effort.     KPS = 100  100 - Normal; no complaints; no evidence of disease. 90   - Able to carry on normal activity; minor signs or symptoms of disease. 80   - Normal activity with effort; some signs or symptoms of  disease. 54   - Cares for self; unable to carry on normal activity or to do active work. 60   - Requires occasional assistance, but is able to care for most of his personal needs. 50   - Requires considerable assistance and frequent medical care. 75   - Disabled; requires special care and assistance. 68   - Severely disabled; hospital admission is indicated although death not imminent. 61   - Very sick; hospital admission necessary; active supportive treatment necessary. 10   - Moribund; fatal processes progressing rapidly. 0     - Dead  Karnofsky DA, Abelmann [place], Craver LS and Burchenal [facility] [phone]) The use of the nitrogen mustards in the palliative treatment of carcinoma: with particular reference to bronchogenic carcinoma Cancer 1 [id number]  LABORATORY DATA:  Lab Results  Component Value Date   WBC 8.4 08/22/2021   HGB 8.4 (L) 08/22/2021   HCT 27.1 (L) 08/22/2021   MCV 93.4 08/22/2021   PLT 300 08/22/2021   Lab Results  Component Value Date   NA 136 08/22/2021   K 4.2 08/22/2021   CL 102 08/22/2021   CO2 24 08/22/2021   Lab Results  Component Value Date   ALT  10 08/22/2021   AST 13 (L) 08/22/2021   ALKPHOS 66 08/22/2021   BILITOT 0.6 08/22/2021     RADIOGRAPHY: DG Chest 1 View  Result Date: 08/22/2021 CLINICAL DATA:  Near syncope. EXAM: CHEST  1 VIEW COMPARISON:  Chest x-ray 07/31/2021. FINDINGS: Sternotomy wires are again noted. The heart is mildly enlarged, unchanged. There are minimal atelectatic changes in the lung bases. There is a small left pleural effusion. There is no pneumothorax or acute fracture. Cervical spinal fusion plate is present. IMPRESSION: 1. Stable small left pleural effusion. 2. Stable mild cardiomegaly. Electronically Signed   By: Ronney Asters M.D.   On: 08/22/2021 19:32   DG Chest 2 View  Result Date: 08/26/2021 CLINICAL DATA:  Post CABG EXAM: CHEST - 2 VIEW COMPARISON:  08/22/2021 FINDINGS: Changes of CABG. Heart is normal size. Lungs clear. No  effusions or pneumothorax. No acute bony abnormality. IMPRESSION: Prior CABG.  No active disease. Electronically Signed   By: Rolm Baptise M.D.   On: 08/26/2021 10:00   NM PET (PSMA) SKULL TO MID THIGH  Result Date: 08/17/2021 CLINICAL DATA:  Prostate carcinoma with biochemical recurrence. EXAM: NUCLEAR MEDICINE PET SKULL BASE TO THIGH TECHNIQUE: 8.7 mCi F18 Piflufolastat (Pylarify) was injected intravenously. Full-ring PET imaging was performed from the skull base to thigh after the radiotracer. CT data was obtained and used for attenuation correction and anatomic localization. COMPARISON:  04/09/2020 FINDINGS: NECK No radiotracer activity in neck lymph nodes. Incidental CT finding: None CHEST No radiotracer accumulation within mediastinal or hilar lymph nodes. No suspicious pulmonary nodules on the CT scan. Incidental CT finding: Postoperative change from recent median sternotomy and CABG procedure. Advanced changes of paraseptal and centrilobular emphysema. Diffuse bronchial wall thickening. Small left pleural effusion identified. ABDOMEN/PELVIS Prostate: Increased uptake within the left side of gland has an SUV max of 6.51. Lymph nodes: No abnormal radiotracer accumulation within pelvic or abdominal nodes. Liver: No evidence of liver metastasis Incidental CT finding: Aortic atherosclerosis. Status post cholecystectomy. Sigmoid diverticulosis. Fat containing left inguinal hernia. Status post TURP. SKELETON No focal  activity to suggest skeletal metastasis. IMPRESSION: 1. Asymmetric increased uptake is noted within the left side of prostate gland. This may correspond with patient's known prostate cancer. 2. No signs of tracer avid solid organ or nodal metastasis. No abnormal uptake identified within the osseous structures. 3. Aortic Atherosclerosis (ICD10-I70.0) and Emphysema (ICD10-J43.9). Electronically Signed   By: Kerby Moors M.D.   On: 08/17/2021 10:57      IMPRESSION/PLAN: 1. 75 y.o. gentleman  with Stage T1b adenocarcinoma of the prostate with Gleason Score of 5+5, and PSA of 1.8 - Stage IIIC.  We discussed the patient's workup and outlined the nature of prostate cancer in this setting. The patient's T stage, Gleason's score, and PSA put him into the very high risk group. Accordingly, he is eligible for a variety of potential treatment options including LT-ADT in combination with 8 weeks of external radiation, 5 weeks of external radiation with an upfront brachytherapy boost, or prostatectomy. We discussed the available radiation techniques, and focused on the details and logistics of delivery. The patient is not an ideal candidate for brachytherapy boost given his recent TURP and significant LUTS. We discussed and outlined the risks, benefits, short and long-term effects associated with radiotherapy and compared and contrasted these with prostatectomy. We discussed the role of SpaceOAR gel in reducing the rectal toxicity associated with radiotherapy. We also detailed the role of ADT in the treatment of high risk prostate  cancer and outlined the associated side effects that could be expected with this therapy. He was encouraged to ask questions that were answered to his stated satisfaction.  At the conclusion of our conversation, the patient is interested in moving forward with 8 weeks of external beam therapy in combination with LT-ADT. He is scheduled to receive his first Firmagon injection on 09/05/21 and will likely remain on monthly Firmagon injections to help minimize his cardiac risk given his underlying cardiac disease. We will share our discussion with Dr. Diona Fanti and make arrangements for fiducial markers and SpaceOAR gel placement in late Jan. 2023, prior to simulation, to reduce rectal toxicity from radiotherapy. The patient appears to have a good understanding of his disease and our treatment recommendations which are of curative intent and is in agreement with the stated plan.   Therefore, we will move forward with treatment planning accordingly, in anticipation of beginning IMRT in early February 2023, approximately 2 months after starting ADT. We enjoyed meeting him and his wife today and look forward to continuing to participate in his care.  We personally spent 75 minutes in this encounter including chart review, reviewing radiological studies, meeting face-to-face with the patient, entering orders and completing documentation.    Nicholos Johns, PA-C    Tyler Pita, MD  [facility] Direct Dial: [phone]  Fax: [phone] [email or web address]  Skype  LinkedIn   This document serves as a record of services personally performed by Tyler Pita, MD and Freeman Caldron, PA-C. It was created on their behalf by Wilburn Mylar, a trained medical scribe. The creation of this record is based on the scribe's personal observations and the provider's statements to them. This document has been checked and approved by the attending provider.

## 2021-09-02 NOTE — Progress Notes (Signed)
Introduced myself to patient as the prostate nurse navigator and discussed my role.  No barriers to care identified at this time.  He is here to discuss his radiation treatment options.  I gave him my business card and asked him to call me with questions or concerns.  Verbalized understanding.

## 2021-09-03 ENCOUNTER — Encounter: Payer: Self-pay | Admitting: *Deleted

## 2021-09-03 ENCOUNTER — Encounter (HOSPITAL_COMMUNITY): Payer: Self-pay

## 2021-09-03 DIAGNOSIS — Z951 Presence of aortocoronary bypass graft: Secondary | ICD-10-CM | POA: Diagnosis not present

## 2021-09-03 DIAGNOSIS — T83511D Infection and inflammatory reaction due to indwelling urethral catheter, subsequent encounter: Secondary | ICD-10-CM | POA: Diagnosis not present

## 2021-09-03 DIAGNOSIS — N390 Urinary tract infection, site not specified: Secondary | ICD-10-CM | POA: Diagnosis not present

## 2021-09-03 DIAGNOSIS — I130 Hypertensive heart and chronic kidney disease with heart failure and stage 1 through stage 4 chronic kidney disease, or unspecified chronic kidney disease: Secondary | ICD-10-CM | POA: Diagnosis not present

## 2021-09-03 DIAGNOSIS — Z48812 Encounter for surgical aftercare following surgery on the circulatory system: Secondary | ICD-10-CM | POA: Diagnosis not present

## 2021-09-03 DIAGNOSIS — I2510 Atherosclerotic heart disease of native coronary artery without angina pectoris: Secondary | ICD-10-CM | POA: Diagnosis not present

## 2021-09-03 NOTE — Progress Notes (Signed)
[facility] CSW Psychosocial Distress Screening   Social Work was referred by distress screening protocol. The patient scored a 5 on the Psychosocial Distress Thermometer which indicates moderate distress. Social Work Theatre manager contacted patient by phone to assess for distress and other psychosocial needs.   ONCBCN DISTRESS SCREENING 09/02/2021  Screening Type Initial Screening  Distress experienced in past week (1-10) 5  Family Problem type Other (comment)  Physical Problem type Changes in urination    Ryan [name] reports he is feeling quite overwhelmed, given that he is still recovering from open heart surgery and must plan cancer treatment around his recovery. However he is very clear on next steps for treatment. He expressed that his wife and sister-in-law are big sources of support, as well as his neighbors who bring meals and check in regularly.    Patient denies any resource or financial needs at this time. He has [employer] and [employer]. Current services in place: [facility] visits for physical therapy and to check vitals.  Patient and Intern discussed common feelings and emotions when being diagnosed with cancer, and the importance of support during treatment; Informed patient of the support team roles and support services at [facility]; Provided CSW contact information and encouraged patient to call with any questions or concerns.  Follow up plan: Yes. Patient requested a follow up call in a few weeks. SW Intern has made referral to Lorrin [facility] for [place] counseling, since Intern will be out of the office until the week of 10/13/21.  Rosary Lively, Social Work Intern Supervised by Gwinda Maine, LCSW

## 2021-09-03 NOTE — Telephone Encounter (Signed)
Attempted to call patient in regards to Cardiac Rehab - LM on VM Mailed letter 

## 2021-09-05 DIAGNOSIS — I480 Paroxysmal atrial fibrillation: Secondary | ICD-10-CM | POA: Diagnosis not present

## 2021-09-05 DIAGNOSIS — N390 Urinary tract infection, site not specified: Secondary | ICD-10-CM | POA: Diagnosis not present

## 2021-09-05 DIAGNOSIS — E782 Mixed hyperlipidemia: Secondary | ICD-10-CM | POA: Diagnosis not present

## 2021-09-05 DIAGNOSIS — M5416 Radiculopathy, lumbar region: Secondary | ICD-10-CM | POA: Diagnosis not present

## 2021-09-05 DIAGNOSIS — I130 Hypertensive heart and chronic kidney disease with heart failure and stage 1 through stage 4 chronic kidney disease, or unspecified chronic kidney disease: Secondary | ICD-10-CM | POA: Diagnosis not present

## 2021-09-05 DIAGNOSIS — F419 Anxiety disorder, unspecified: Secondary | ICD-10-CM | POA: Diagnosis not present

## 2021-09-05 DIAGNOSIS — N182 Chronic kidney disease, stage 2 (mild): Secondary | ICD-10-CM | POA: Diagnosis not present

## 2021-09-05 DIAGNOSIS — I2510 Atherosclerotic heart disease of native coronary artery without angina pectoris: Secondary | ICD-10-CM | POA: Diagnosis not present

## 2021-09-05 DIAGNOSIS — Z48812 Encounter for surgical aftercare following surgery on the circulatory system: Secondary | ICD-10-CM | POA: Diagnosis not present

## 2021-09-05 DIAGNOSIS — J439 Emphysema, unspecified: Secondary | ICD-10-CM | POA: Diagnosis not present

## 2021-09-05 DIAGNOSIS — I739 Peripheral vascular disease, unspecified: Secondary | ICD-10-CM | POA: Diagnosis not present

## 2021-09-05 DIAGNOSIS — N401 Enlarged prostate with lower urinary tract symptoms: Secondary | ICD-10-CM | POA: Diagnosis not present

## 2021-09-05 DIAGNOSIS — F4321 Adjustment disorder with depressed mood: Secondary | ICD-10-CM | POA: Diagnosis not present

## 2021-09-05 DIAGNOSIS — D631 Anemia in chronic kidney disease: Secondary | ICD-10-CM | POA: Diagnosis not present

## 2021-09-05 DIAGNOSIS — G4730 Sleep apnea, unspecified: Secondary | ICD-10-CM | POA: Diagnosis not present

## 2021-09-05 DIAGNOSIS — Z951 Presence of aortocoronary bypass graft: Secondary | ICD-10-CM | POA: Diagnosis not present

## 2021-09-05 DIAGNOSIS — K5900 Constipation, unspecified: Secondary | ICD-10-CM | POA: Diagnosis not present

## 2021-09-05 DIAGNOSIS — T83511D Infection and inflammatory reaction due to indwelling urethral catheter, subsequent encounter: Secondary | ICD-10-CM | POA: Diagnosis not present

## 2021-09-05 DIAGNOSIS — N138 Other obstructive and reflux uropathy: Secondary | ICD-10-CM | POA: Diagnosis not present

## 2021-09-05 DIAGNOSIS — H409 Unspecified glaucoma: Secondary | ICD-10-CM | POA: Diagnosis not present

## 2021-09-05 DIAGNOSIS — Z7982 Long term (current) use of aspirin: Secondary | ICD-10-CM | POA: Diagnosis not present

## 2021-09-05 DIAGNOSIS — I5032 Chronic diastolic (congestive) heart failure: Secondary | ICD-10-CM | POA: Diagnosis not present

## 2021-09-05 DIAGNOSIS — K219 Gastro-esophageal reflux disease without esophagitis: Secondary | ICD-10-CM | POA: Diagnosis not present

## 2021-09-05 DIAGNOSIS — C61 Malignant neoplasm of prostate: Secondary | ICD-10-CM | POA: Diagnosis not present

## 2021-09-05 DIAGNOSIS — M810 Age-related osteoporosis without current pathological fracture: Secondary | ICD-10-CM | POA: Diagnosis not present

## 2021-09-05 DIAGNOSIS — I4892 Unspecified atrial flutter: Secondary | ICD-10-CM | POA: Diagnosis not present

## 2021-09-07 ENCOUNTER — Encounter: Payer: Self-pay | Admitting: Gastroenterology

## 2021-09-09 ENCOUNTER — Encounter: Payer: Self-pay | Admitting: General Practice

## 2021-09-09 ENCOUNTER — Ambulatory Visit: Payer: Medicare Other | Admitting: Family Medicine

## 2021-09-09 DIAGNOSIS — Z951 Presence of aortocoronary bypass graft: Secondary | ICD-10-CM | POA: Diagnosis not present

## 2021-09-09 DIAGNOSIS — I130 Hypertensive heart and chronic kidney disease with heart failure and stage 1 through stage 4 chronic kidney disease, or unspecified chronic kidney disease: Secondary | ICD-10-CM | POA: Diagnosis not present

## 2021-09-09 DIAGNOSIS — Z48812 Encounter for surgical aftercare following surgery on the circulatory system: Secondary | ICD-10-CM | POA: Diagnosis not present

## 2021-09-09 DIAGNOSIS — I2510 Atherosclerotic heart disease of native coronary artery without angina pectoris: Secondary | ICD-10-CM | POA: Diagnosis not present

## 2021-09-09 DIAGNOSIS — N390 Urinary tract infection, site not specified: Secondary | ICD-10-CM | POA: Diagnosis not present

## 2021-09-09 DIAGNOSIS — T83511D Infection and inflammatory reaction due to indwelling urethral catheter, subsequent encounter: Secondary | ICD-10-CM | POA: Diagnosis not present

## 2021-09-09 NOTE — Telephone Encounter (Signed)
In 06/2021 was having recurrence of Microscopic Colitis symptoms.  Was given a prescription of budesonide 6 mg/daily x6 weeks the 3 mg/day for 2 additional months.  Can you please clarify if he has been having good control of symptoms at 3 mg/day and wants an extended course of the low-dose, or is he having breakthrough at the lower dose?

## 2021-09-09 NOTE — Progress Notes (Signed)
[employer] Spiritual Care Note  Referred by [name] Work Intern for [employer]. Reached Ryan [name] by phone. He was very welcoming of opportunity to share and process remarkable stories of healing, resilience, and transferable coping skills, including when he had rheumatic fever at age 75, DDT poisoning from farming at age 42, and now his cardiac recovery prior to prostate cancer treatment. Provided empathic listening, emotional support, and affirmation of strengths. His faith and his goal-oriented nature are helping him cope. He is a retired [employer].  Ryan [name] has my direct-dial number and plans to reach out as needed/desired.   [place], [place], [facility] Pager [phone] Voicemail [phone]

## 2021-09-12 ENCOUNTER — Telehealth (HOSPITAL_COMMUNITY): Payer: Self-pay

## 2021-09-12 DIAGNOSIS — Z48812 Encounter for surgical aftercare following surgery on the circulatory system: Secondary | ICD-10-CM | POA: Diagnosis not present

## 2021-09-12 DIAGNOSIS — I2510 Atherosclerotic heart disease of native coronary artery without angina pectoris: Secondary | ICD-10-CM | POA: Diagnosis not present

## 2021-09-12 DIAGNOSIS — Z951 Presence of aortocoronary bypass graft: Secondary | ICD-10-CM | POA: Diagnosis not present

## 2021-09-12 DIAGNOSIS — T83511D Infection and inflammatory reaction due to indwelling urethral catheter, subsequent encounter: Secondary | ICD-10-CM | POA: Diagnosis not present

## 2021-09-12 DIAGNOSIS — I130 Hypertensive heart and chronic kidney disease with heart failure and stage 1 through stage 4 chronic kidney disease, or unspecified chronic kidney disease: Secondary | ICD-10-CM | POA: Diagnosis not present

## 2021-09-12 DIAGNOSIS — N390 Urinary tract infection, site not specified: Secondary | ICD-10-CM | POA: Diagnosis not present

## 2021-09-12 NOTE — Telephone Encounter (Signed)
Pt called back and stated  that he is not interested in the cardiac rehab program, pt states that he has gym equipment at his home and he has home health come in his home. Closed referral.

## 2021-09-15 DIAGNOSIS — T83511D Infection and inflammatory reaction due to indwelling urethral catheter, subsequent encounter: Secondary | ICD-10-CM | POA: Diagnosis not present

## 2021-09-15 DIAGNOSIS — Z951 Presence of aortocoronary bypass graft: Secondary | ICD-10-CM | POA: Diagnosis not present

## 2021-09-15 DIAGNOSIS — I130 Hypertensive heart and chronic kidney disease with heart failure and stage 1 through stage 4 chronic kidney disease, or unspecified chronic kidney disease: Secondary | ICD-10-CM | POA: Diagnosis not present

## 2021-09-15 DIAGNOSIS — I2510 Atherosclerotic heart disease of native coronary artery without angina pectoris: Secondary | ICD-10-CM | POA: Diagnosis not present

## 2021-09-15 DIAGNOSIS — N390 Urinary tract infection, site not specified: Secondary | ICD-10-CM | POA: Diagnosis not present

## 2021-09-15 DIAGNOSIS — Z48812 Encounter for surgical aftercare following surgery on the circulatory system: Secondary | ICD-10-CM | POA: Diagnosis not present

## 2021-09-15 MED ORDER — BUDESONIDE 3 MG PO CPEP: ORAL_CAPSULE | ORAL | 0 refills | Status: AC

## 2021-09-15 NOTE — Telephone Encounter (Signed)
Understood.  In that case, okay for refill of budesonide 6 mg/day for 6 weeks then reduce to 3 mg/day for another 8 weeks.  Can follow-up with me in the GI clinic as needed in the new year as he outlined.  Thank you.

## 2021-09-16 ENCOUNTER — Other Ambulatory Visit: Payer: Self-pay | Admitting: Family Medicine

## 2021-09-16 DIAGNOSIS — R197 Diarrhea, unspecified: Secondary | ICD-10-CM

## 2021-09-18 DIAGNOSIS — H353231 Exudative age-related macular degeneration, bilateral, with active choroidal neovascularization: Secondary | ICD-10-CM | POA: Diagnosis not present

## 2021-09-23 ENCOUNTER — Ambulatory Visit (INDEPENDENT_AMBULATORY_CARE_PROVIDER_SITE_OTHER): Payer: Medicare Other | Admitting: Family Medicine

## 2021-09-23 ENCOUNTER — Encounter: Payer: Self-pay | Admitting: Family Medicine

## 2021-09-23 VITALS — BP 114/78 | HR 94 | Temp 97.7°F | Resp 16 | Ht 64.0 in | Wt 163.2 lb

## 2021-09-23 DIAGNOSIS — E782 Mixed hyperlipidemia: Secondary | ICD-10-CM

## 2021-09-23 DIAGNOSIS — R790 Abnormal level of blood mineral: Secondary | ICD-10-CM | POA: Diagnosis not present

## 2021-09-23 DIAGNOSIS — I10 Essential (primary) hypertension: Secondary | ICD-10-CM | POA: Diagnosis not present

## 2021-09-23 DIAGNOSIS — I2584 Coronary atherosclerosis due to calcified coronary lesion: Secondary | ICD-10-CM | POA: Diagnosis not present

## 2021-09-23 DIAGNOSIS — E559 Vitamin D deficiency, unspecified: Secondary | ICD-10-CM | POA: Diagnosis not present

## 2021-09-23 DIAGNOSIS — R197 Diarrhea, unspecified: Secondary | ICD-10-CM | POA: Diagnosis not present

## 2021-09-23 DIAGNOSIS — D649 Anemia, unspecified: Secondary | ICD-10-CM

## 2021-09-23 DIAGNOSIS — I2510 Atherosclerotic heart disease of native coronary artery without angina pectoris: Secondary | ICD-10-CM | POA: Diagnosis not present

## 2021-09-23 DIAGNOSIS — C61 Malignant neoplasm of prostate: Secondary | ICD-10-CM

## 2021-09-23 MED ORDER — MAGNESIUM OXIDE -MG SUPPLEMENT 400 (240 MG) MG PO TABS
400.0000 mg | ORAL_TABLET | Freq: Three times a day (TID) | ORAL | 1 refills | Status: DC
Start: 2021-09-23 — End: 2022-05-20

## 2021-09-23 MED ORDER — REPATHA SURECLICK 140 MG/ML ~~LOC~~ SOAJ: 140.0000 mg | SUBCUTANEOUS | 3 refills | Status: DC

## 2021-09-23 NOTE — Progress Notes (Signed)
Patient ID: Ryan [name], male    DOB: 1946/01/06  Age: 75 y.o. MRN: [id number]    Subjective:   No chief complaint on file.  Subjective   HPI Ryan [name] presents for office visit today for follow up on CAD and prostate cancer. He is waiting on Dr. Roxan Hockey and Dr. Diona Fanti to clear him for Dr. Tammi Klippel to start him on radiation treatment. The tx will be 5x days a week for 8 weeks starting next year. He states that Dr. Kerri Perches is treating him for macular degeneration in his right eye. Denies CP/palp/SOB/HA/fevers or GU c/o. Taking meds as prescribed. Health Power of attorney and well  Review of Systems  Constitutional:  Negative for chills, fatigue and fever.  HENT:  Positive for trouble swallowing. Negative for congestion, rhinorrhea, sinus pressure, sinus pain and sore throat.   Eyes:  Negative for pain.  Respiratory:  Negative for cough and shortness of breath.   Cardiovascular:  Negative for chest pain, palpitations and leg swelling.  Gastrointestinal:  Positive for diarrhea. Negative for abdominal pain, blood in stool, nausea and vomiting.  Genitourinary:  Negative for flank pain, frequency and penile pain.  Musculoskeletal:  Negative for back pain.  Neurological:  Negative for headaches.   History Past Medical History:  Diagnosis Date   Anemia    Anticoagulant long-term use    changed from eliquis to praxada 06/ 2022--- mananged by cardiology   BPH with urinary obstruction    CAD (coronary artery disease)    cardiologist--- dr g. taylor--- cath 04-22-2011  moderate LM stenosis, borderline sig pRCA, sig stenosis mid to distal LAD ;  aggressive medical therapy   Chronic diastolic CHF (congestive heart failure) ([employer]) 03/2018   followed by cardiology   CKD (chronic kidney disease), stage II    COPD (chronic obstructive pulmonary disease) ([place])    followed by pcp   DOE (dyspnea on exertion)    per pt when walk >100yds,  per pt rides outside bike 4-8 miles daily  without sob,  household chores and yard work without sob   Eczema    GERD (gastroesophageal reflux disease)    Glaucoma, left eye    Heart murmur    Hemorrhoids    History of adenomatous polyp of colon    History of coma 1962   per pt age 29 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 Bryan Lemma--- dx by biopsy 01-28-2021   Macular degeneration of both eyes    Osteoporosis 05/31/2016   PAF (paroxysmal atrial fibrillation) ([place]) 03/2012   cardiologist-- dr g. taylor;  first dx 06/ 2013 AFlutter w/ RVR   Urethral lesion    Vitamin D deficiency     He has a past surgical history that includes Anterior cervical decomp/discectomy fusion (06/08/2002); Laparoscopic cholecystectomy (2000); Foot surgery (Right, 1970);  Appendectomy (1953); Cardiac catheterization (04/22/2011); Cardioversion (N/A, 04/25/2018); Colonoscopy with esophagogastroduodenoscopy (egd) (01/28/2021); Knee arthroscopy (Right, 1991); Total knee arthroplasty (Right, 08/14/2009); Cataract extraction w/ intraocular lens implant (Bilateral, 2021); Extracorporeal shock wave lithotripsy; Transurethral resection of prostate (N/A, 07/17/2021); LEFT HEART CATH AND CORONARY ANGIOGRAPHY (N/A, 07/21/2021); Coronary artery bypass graft (N/A, 07/22/2021); TEE without cardioversion (N/A, 07/22/2021); and Endoharvest vein of greater saphenous vein (07/22/2021).   His family history includes Arthritis in his  sister and sister; Cancer in his father; Cirrhosis in his mother and sister; Coronary artery disease in an other family member; Diabetes in his mother; Emphysema in his mother; Heart attack in his father; Heart disease in his brother, father, mother, sister, and sister; Hyperlipidemia in his brother, father, and sister; Hypertension in his father and mother; Liver disease in an other family member; Macular degeneration in his maternal grandfather, mother, and sister; Obesity in his sister, sister, and sister; Peripheral vascular disease in his father; Prostate cancer in an other family member; Varicose Veins in his father.He reports that he quit smoking about 12 years ago. His smoking use included cigarettes. He has a 25.00 pack-year smoking history. He quit smokeless tobacco use about 27 years ago.  His smokeless tobacco use included chew. He reports current alcohol use of about 2.0 standard drinks per week. He reports that he does not use drugs.  Current Outpatient Medications on File Prior to Visit  Medication Sig Dispense Refill   aspirin EC 81 MG EC tablet Take 1 tablet (81 mg total) by mouth daily. Swallow whole. 30 tablet 11   Baclofen 5 MG TABS Take 5 mg by mouth 3 (three) times daily as needed (for hiccups). 30 tablet 1   brimonidine (ALPHAGAN) 0.2 % ophthalmic solution Place 1 drop into the left eye in the morning and at bedtime.   7   budesonide (ENTOCORT EC) 3 MG 24 hr capsule Take 2 capsules (6 mg total) by mouth daily for 42 days, THEN 1 capsule (3 mg total) daily. 140 capsule 0   clobetasol (TEMOVATE) 0.05 % external solution APPLY 1 APPLICATION TOPICALLY TWICE DAILY 100 mL 3   dabigatran (PRADAXA) 150 MG CAPS capsule Take 1 capsule (150 mg total) by mouth 2 (two) times daily. 180 capsule 3   latanoprost (XALATAN) 0.005 % ophthalmic solution Place 1 drop into the left eye at bedtime.      levalbuterol (XOPENEX HFA) 45 MCG/ACT inhaler INHALE 2 PUFFS BY MOUTH INTO THE LUNGS EVERY 4 HOURS AS  NEEDED FOR WHEEZING 45 g 2   loperamide (IMODIUM) 2 MG capsule TAKE 1 CAPSULE(2 MG) BY MOUTH EVERY 6 HOURS AS NEEDED FOR DIARRHEA OR LOOSE STOOLS 30 capsule 0   metoprolol tartrate (LOPRESSOR) 25 MG tablet TAKE 1 TABLET BY MOUTH  TWICE DAILY (Patient taking differently: Take 25 mg by mouth 2 (two) times daily.) 180 tablet 3   nitroGLYCERIN (NITROSTAT) 0.4 MG SL tablet Place 1 tablet (0.4 mg total) under the tongue every 5 (five) minutes as needed for chest pain. 30 tablet 0   omeprazole (PRILOSEC OTC) 20 MG tablet Take 20 mg by mouth daily.     propafenone (RYTHMOL) 225 MG tablet TAKE 1 TABLET BY MOUTH  TWICE DAILY (Patient taking differently: Take 225 mg by mouth 2 (two) times daily.) 180 tablet 2   Ranibizumab (LUCENTIS IO) Inject 1 Dose into the eye once as needed (Macular degeneration).     tamsulosin (FLOMAX) 0.4 MG CAPS capsule Take 0.4 mg by mouth 2 (two) times daily.  No current facility-administered medications on file prior to visit.     Objective:  Objective  Physical Exam Constitutional:      General: He is not in acute distress.    Appearance: Normal appearance. He is not ill-appearing or toxic-appearing.  HENT:     Head: Normocephalic and atraumatic.     Right Ear: Tympanic membrane, ear canal and external ear normal.     Left Ear: Tympanic membrane, ear canal and external ear normal.     Nose: No congestion or rhinorrhea.  Eyes:     Extraocular Movements: Extraocular movements intact.     Pupils: Pupils are equal, round, and reactive to light.  Cardiovascular:     Rate and Rhythm: Normal rate and regular rhythm.     Pulses: Normal pulses.     Heart sounds: Normal heart sounds. No murmur heard. Pulmonary:     Effort: Pulmonary effort is normal. No respiratory distress.     Breath sounds: Normal breath sounds. No wheezing, rhonchi or rales.  Abdominal:     General: Bowel sounds are normal.     Palpations: Abdomen is soft. There is no mass.     Tenderness: There is  no abdominal tenderness. There is no guarding.     Hernia: No hernia is present.  Musculoskeletal:        General: Normal range of motion.     Cervical back: Normal range of motion and neck supple.  Skin:    General: Skin is warm and dry.  Neurological:     Mental Status: He is alert and oriented to person, place, and time.  Psychiatric:        Behavior: Behavior normal.   BP 114/78    Pulse 94    Temp 97.7 F (36.5 C)    Resp 16    Ht 5\' 4"  (1.626 m)    Wt 163 lb 3.2 oz (74 kg)    SpO2 96%    BMI 28.01 kg/m  Wt Readings from Last 3 Encounters:  09/23/21 163 lb 3.2 oz (74 kg)  09/02/21 162 lb 2 oz (73.5 kg)  08/26/21 161 lb (73 kg)     Lab Results  Component Value Date   WBC 13.7 (H) 09/23/2021   HGB 11.3 (L) 09/23/2021   HCT 36.0 (L) 09/23/2021   PLT 229.0 09/23/2021   GLUCOSE 91 09/23/2021   CHOL 289 (H) 03/24/2021   TRIG 80 03/24/2021   HDL 51 03/24/2021   LDLDIRECT 134.2 12/01/2013   LDLCALC 222 (H) 03/24/2021   ALT 7 09/23/2021   AST 16 09/23/2021   NA 138 09/23/2021   K 4.4 09/23/2021   CL 100 09/23/2021   CREATININE 1.49 09/23/2021   BUN 23 09/23/2021   CO2 22 09/23/2021   TSH 2.38 03/04/2021   PSA 1.80 03/04/2021   INR 1.4 (H) 07/22/2021   HGBA1C 5.0 07/21/2021    No results found.   Assessment & Plan:  Plan    Meds ordered this encounter  Medications   Evolocumab (REPATHA SURECLICK) 161 MG/ML SOAJ    Sig: Inject 140 mg into the skin every 14 (fourteen) days.    Dispense:  6 mL    Refill:  3   MAGnesium-Oxide 400 (240 Mg) MG tablet    Sig: Take 1 tablet (400 mg total) by mouth 3 (three) times daily.    Dispense:  270 tablet    Refill:  1    Problem List Items Addressed This Visit  Hyperlipidemia, mixed    Tolerating Repatha      Relevant Medications   Evolocumab (REPATHA SURECLICK) 194 MG/ML SOAJ   Essential hypertension - Primary    Well controlled, no changes to meds. Encouraged heart healthy diet such as the DASH diet and  exercise as tolerated.         Relevant Medications   Evolocumab (REPATHA SURECLICK) 174 MG/ML SOAJ   Other Relevant Orders   CBC w/Diff (Completed)   Comprehensive metabolic panel (Completed)   CAD (coronary atherosclerotic disease)    Continues to recover from his surgery and has follow up with Dr Roxan Hockey on 10/07/2021. Is hoping to be cleared to proceed with his urologic procedures.       Relevant Medications   Evolocumab (REPATHA SURECLICK) 081 MG/ML SOAJ   Vitamin D deficiency    Supplement and monitor      Hypomagnesemia    Supplement and monitor      Diarrhea   Malignant neoplasm of prostate [facility])    He is following with Dr Diona Fanti of [facility] and Dr Tammi Klippel of Oncology. Is getting ready to start Radiation treatments in January and is very nervous about it but ready to go      Other Visit Diagnoses     Anemia, unspecified type       Relevant Orders   Iron, TIBC and Ferritin Panel (Completed)   CBC w/Diff (Completed)   Retic (Completed)   Low magnesium level       Relevant Medications   MAGnesium-Oxide 400 (240 Mg) MG tablet       Follow-up: Return in about 3 weeks (around 10/14/2021).  I, Suezanne Jacquet, acting as a scribe for Penni Homans, MD, have documented all relevent documentation on behalf of Penni Homans, MD, as directed by Penni Homans, MD while in the presence of Penni Homans, MD. DO:09/24/21.  I, [name], MD personally performed the services described in this documentation. All medical record entries made by the scribe were at my direction and in my presence. I have reviewed the chart and agree that the record reflects my personal performance and is accurate and complete

## 2021-09-23 NOTE — Patient Instructions (Signed)
Pharyngitis Pharyngitis is inflammation of the throat (pharynx). It is a very common cause of sore throat. Pharyngitis can be caused by a bacteria, but it is usually caused by a virus. Most cases of pharyngitis get better on their own without treatment. What are the causes? This condition may be caused by: Infection by viruses (viral). Viral pharyngitis spreads easily from person to person (is contagious) through coughing, sneezing, and sharing of personal items or utensils such as cups, forks, spoons, and toothbrushes. Infection by bacteria (bacterial). Bacterial pharyngitis may be spread by touching the nose or face after coming in contact with the bacteria, or through close contact, such as kissing. Allergies. Allergies can cause buildup of mucus in the throat (post-nasal drip), leading to inflammation and irritation. Allergies can also cause blocked nasal passages, forcing breathing through the mouth, which dries and irritates the throat. What increases the risk? You are more likely to develop this condition if: You are 93-72 years old. You are exposed to crowded environments such as daycare, school, or dormitory living. You live in a cold climate. You have a weakened disease-fighting (immune) system. What are the signs or symptoms? Symptoms of this condition vary by the cause. Common symptoms of this condition include: Sore throat. Fatigue. Low-grade fever. Stuffy nose (nasal congestion) and cough. Headache. Other symptoms may include: Glands in the neck (lymph nodes) that are swollen. Skin rashes. Plaque-like film on the throat or tonsils. This is often a symptom of bacterial pharyngitis. Vomiting. Red, itchy eyes (conjunctivitis). Loss of appetite. Joint pain and muscle aches. Enlarged tonsils. How is this diagnosed? This condition may be diagnosed based on your medical history and a physical exam. Your health care provider will ask you questions about your illness and your  symptoms. A swab of your throat may be done to check for bacteria (rapid strep test). Other lab tests may also be done, depending on the suspected cause, but these are rare. How is this treated? Many times, treatment is not needed for this condition. Pharyngitis usually gets better in 3-4 days without treatment. Bacterial pharyngitis may be treated with antibiotic medicines. Follow these instructions at home: Medicines Take over-the-counter and prescription medicines only as told by your health care provider. If you were prescribed an antibiotic medicine, take it as told by your health care provider. Do not stop taking the antibiotic even if you start to feel better. Use throat sprays to soothe your throat as told by your health care provider. Children can get pharyngitis. Do not give your child aspirin because of the association with Reye's syndrome. Managing pain To help with pain, try: Sipping warm liquids, such as broth, herbal tea, or warm water. Eating or drinking cold or frozen liquids, such as frozen ice pops. Gargling with a mixture of salt and water 3-4 times a day or as needed. To make salt water, completely dissolve -1 tsp (3-6 g) of salt in 1 cup (237 mL) of warm water. Sucking on hard candy or throat lozenges. Putting a cool-mist humidifier in your bedroom at night to moisten the air. Sitting in the bathroom with the door closed for 5-10 minutes while you run hot water in the shower.  General instructions  Do not use any products that contain nicotine or tobacco. These products include cigarettes, chewing tobacco, and vaping devices, such as e-cigarettes. If you need help quitting, ask your health care provider. Rest as told by your health care provider. Drink enough fluid to keep your urine pale yellow. How  is this prevented? To help prevent becoming infected or spreading infection: Wash your hands often with soap and water for at least 20 seconds. If soap and water are not  available, use hand sanitizer. Do not touch your eyes, nose, or mouth with unwashed hands, and wash hands after touching these areas. Do not share cups or eating utensils. Avoid close contact with people who are sick. Contact a health care provider if: You have large, tender lumps in your neck. You have a rash. You cough up green, yellow-brown, or bloody mucus. Get help right away if: Your neck becomes stiff. You drool or are unable to swallow liquids. You cannot drink or take medicines without vomiting. You have severe pain that does not go away, even after you take medicine. You have trouble breathing, and it is not caused by a stuffy nose. You have new pain and swelling in your joints such as the knees, ankles, wrists, or elbows. These symptoms may represent a serious problem that is an emergency. Do not wait to see if the symptoms will go away. Get medical help right away. Call your local emergency services (911 in the [place] Do not drive yourself to the hospital. Summary Pharyngitis is redness, pain, and swelling (inflammation) of the throat (pharynx). While pharyngitis can be caused by a bacteria, the most common causes are viral. Most cases of pharyngitis get better on their own without treatment. Bacterial pharyngitis is treated with antibiotic medicines. This information is not intended to replace advice given to you by your health care provider. Make sure you discuss any questions you have with your health care provider. Document Revised: 12/18/2020 Document Reviewed: 12/18/2020 Elsevier Patient Education  [place].

## 2021-09-24 ENCOUNTER — Encounter: Payer: Self-pay | Admitting: Family Medicine

## 2021-09-24 ENCOUNTER — Other Ambulatory Visit: Payer: Self-pay

## 2021-09-24 DIAGNOSIS — Z48812 Encounter for surgical aftercare following surgery on the circulatory system: Secondary | ICD-10-CM | POA: Diagnosis not present

## 2021-09-24 DIAGNOSIS — T83511D Infection and inflammatory reaction due to indwelling urethral catheter, subsequent encounter: Secondary | ICD-10-CM | POA: Diagnosis not present

## 2021-09-24 DIAGNOSIS — Z951 Presence of aortocoronary bypass graft: Secondary | ICD-10-CM | POA: Diagnosis not present

## 2021-09-24 DIAGNOSIS — I130 Hypertensive heart and chronic kidney disease with heart failure and stage 1 through stage 4 chronic kidney disease, or unspecified chronic kidney disease: Secondary | ICD-10-CM | POA: Diagnosis not present

## 2021-09-24 DIAGNOSIS — N390 Urinary tract infection, site not specified: Secondary | ICD-10-CM | POA: Diagnosis not present

## 2021-09-24 DIAGNOSIS — J019 Acute sinusitis, unspecified: Secondary | ICD-10-CM

## 2021-09-24 DIAGNOSIS — I2510 Atherosclerotic heart disease of native coronary artery without angina pectoris: Secondary | ICD-10-CM | POA: Diagnosis not present

## 2021-09-24 LAB — CBC WITH DIFFERENTIAL/PLATELET
Basophils Absolute: 0.1 10*3/uL (ref 0.0–0.1)
Basophils Relative: 0.8 % (ref 0.0–3.0)
Eosinophils Absolute: 0.1 10*3/uL (ref 0.0–0.7)
Eosinophils Relative: 0.9 % (ref 0.0–5.0)
HCT: 36 % — ABNORMAL LOW (ref 39.0–52.0)
Hemoglobin: 11.3 g/dL — ABNORMAL LOW (ref 13.0–17.0)
Lymphocytes Relative: 11.1 % — ABNORMAL LOW (ref 12.0–46.0)
Lymphs Abs: 1.5 10*3/uL (ref 0.7–4.0)
MCHC: 31.3 g/dL (ref 30.0–36.0)
MCV: 86.6 fl (ref 78.0–100.0)
Monocytes Absolute: 1 10*3/uL (ref 0.1–1.0)
Monocytes Relative: 7.6 % (ref 3.0–12.0)
Neutro Abs: 10.9 10*3/uL — ABNORMAL HIGH (ref 1.4–7.7)
Neutrophils Relative %: 79.6 % — ABNORMAL HIGH (ref 43.0–77.0)
Platelets: 229 10*3/uL (ref 150.0–400.0)
RBC: 4.16 Mil/uL — ABNORMAL LOW (ref 4.22–5.81)
RDW: 16.8 % — ABNORMAL HIGH (ref 11.5–15.5)
WBC: 13.7 10*3/uL — ABNORMAL HIGH (ref 4.0–10.5)

## 2021-09-24 LAB — IRON,TIBC AND FERRITIN PANEL
%SAT: 6 % (calc) — ABNORMAL LOW (ref 20–48)
Ferritin: 66 ng/mL (ref 24–380)
Iron: 19 ug/dL — ABNORMAL LOW (ref 50–180)
TIBC: 324 mcg/dL (calc) (ref 250–425)

## 2021-09-24 LAB — COMPREHENSIVE METABOLIC PANEL
ALT: 7 U/L (ref 0–53)
AST: 16 U/L (ref 0–37)
Albumin: 4 g/dL (ref 3.5–5.2)
Alkaline Phosphatase: 77 U/L (ref 39–117)
BUN: 23 mg/dL (ref 6–23)
CO2: 22 mEq/L (ref 19–32)
Calcium: 9.6 mg/dL (ref 8.4–10.5)
Chloride: 100 mEq/L (ref 96–112)
Creatinine, Ser: 1.49 mg/dL (ref 0.40–1.50)
GFR: 45.89 mL/min — ABNORMAL LOW (ref >60.00)
Glucose, Bld: 91 mg/dL (ref 70–99)
Potassium: 4.4 mEq/L (ref 3.5–5.1)
Sodium: 138 mEq/L (ref 135–145)
Total Bilirubin: 0.5 mg/dL (ref 0.2–1.2)
Total Protein: 7.2 g/dL (ref 6.0–8.3)

## 2021-09-24 LAB — RETICULOCYTES
ABS Retic: 37710 cells/uL (ref 25000–90000)
Retic Ct Pct: 0.9 %

## 2021-09-24 MED ORDER — FERROUS FUMARATE 324 (106 FE) MG PO TABS: 1.0000 | ORAL_TABLET | Freq: Every day | ORAL | 3 refills | Status: DC

## 2021-09-24 NOTE — Assessment & Plan Note (Signed)
Supplement and monitor 

## 2021-09-24 NOTE — Assessment & Plan Note (Signed)
He is following with Dr Diona Fanti of [facility] and Dr Tammi Klippel of Oncology. Is getting ready to start Radiation treatments in January and is very nervous about it but ready to go

## 2021-09-24 NOTE — Assessment & Plan Note (Signed)
Continues to recover from his surgery and has follow up with Dr Roxan Hockey on 10/07/2021. Is hoping to be cleared to proceed with his urologic procedures.

## 2021-09-24 NOTE — Assessment & Plan Note (Signed)
Tolerating Repatha

## 2021-09-24 NOTE — Assessment & Plan Note (Signed)
Well controlled, no changes to meds. Encouraged heart healthy diet such as the DASH diet and exercise as tolerated.  °

## 2021-09-25 DIAGNOSIS — Z48812 Encounter for surgical aftercare following surgery on the circulatory system: Secondary | ICD-10-CM | POA: Diagnosis not present

## 2021-09-25 DIAGNOSIS — I2510 Atherosclerotic heart disease of native coronary artery without angina pectoris: Secondary | ICD-10-CM | POA: Diagnosis not present

## 2021-09-25 DIAGNOSIS — N390 Urinary tract infection, site not specified: Secondary | ICD-10-CM | POA: Diagnosis not present

## 2021-09-25 DIAGNOSIS — Z951 Presence of aortocoronary bypass graft: Secondary | ICD-10-CM | POA: Diagnosis not present

## 2021-09-25 DIAGNOSIS — T83511D Infection and inflammatory reaction due to indwelling urethral catheter, subsequent encounter: Secondary | ICD-10-CM | POA: Diagnosis not present

## 2021-09-25 DIAGNOSIS — I130 Hypertensive heart and chronic kidney disease with heart failure and stage 1 through stage 4 chronic kidney disease, or unspecified chronic kidney disease: Secondary | ICD-10-CM | POA: Diagnosis not present

## 2021-09-30 ENCOUNTER — Other Ambulatory Visit: Payer: Self-pay | Admitting: Family Medicine

## 2021-09-30 DIAGNOSIS — D509 Iron deficiency anemia, unspecified: Secondary | ICD-10-CM

## 2021-10-01 DIAGNOSIS — Z951 Presence of aortocoronary bypass graft: Secondary | ICD-10-CM | POA: Diagnosis not present

## 2021-10-01 DIAGNOSIS — Z48812 Encounter for surgical aftercare following surgery on the circulatory system: Secondary | ICD-10-CM | POA: Diagnosis not present

## 2021-10-01 DIAGNOSIS — N390 Urinary tract infection, site not specified: Secondary | ICD-10-CM | POA: Diagnosis not present

## 2021-10-01 DIAGNOSIS — I2510 Atherosclerotic heart disease of native coronary artery without angina pectoris: Secondary | ICD-10-CM | POA: Diagnosis not present

## 2021-10-01 DIAGNOSIS — I130 Hypertensive heart and chronic kidney disease with heart failure and stage 1 through stage 4 chronic kidney disease, or unspecified chronic kidney disease: Secondary | ICD-10-CM | POA: Diagnosis not present

## 2021-10-01 DIAGNOSIS — T83511D Infection and inflammatory reaction due to indwelling urethral catheter, subsequent encounter: Secondary | ICD-10-CM | POA: Diagnosis not present

## 2021-10-01 NOTE — Telephone Encounter (Signed)
He wanted to hold off until he got his labs done

## 2021-10-07 ENCOUNTER — Encounter: Payer: Self-pay | Admitting: Thoracic Surgery (Cardiothoracic Vascular Surgery)

## 2021-10-07 ENCOUNTER — Other Ambulatory Visit: Payer: Self-pay

## 2021-10-07 ENCOUNTER — Ambulatory Visit (INDEPENDENT_AMBULATORY_CARE_PROVIDER_SITE_OTHER): Payer: Medicare Other | Admitting: Thoracic Surgery (Cardiothoracic Vascular Surgery)

## 2021-10-07 VITALS — BP 121/69 | HR 82 | Resp 20 | Ht 64.0 in | Wt 168.0 lb

## 2021-10-07 DIAGNOSIS — Z951 Presence of aortocoronary bypass graft: Secondary | ICD-10-CM

## 2021-10-07 DIAGNOSIS — C61 Malignant neoplasm of prostate: Secondary | ICD-10-CM | POA: Diagnosis not present

## 2021-10-07 NOTE — Progress Notes (Signed)
[address]       ,[place] [address]             [phone]       HPI: Mr. Lurz returns for a scheduled follow-up visit  Katelyn "Milbert Coulter" Wach is a 76 year old man with a history of CAD, hypertension, chronic kidney disease, COPD, chronic diastolic CHF, paroxysmal atrial fibrillation, BPH, and prostate cancer.  He had a TURP on 07/17/2021.  He had been admitted with UTI after that and developed chest pain.  He underwent catheterization was found to have an ostial left main 90% stenosis.  He underwent coronary bypass grafting x4 on 07/22/2021.  His postop course was complicated by nausea, hiccups, and atrial fibrillation.  I saw him in the office on 08/26/2021.  He was making good progress.  In the interim since his last visit he has continued to improve.  He is anxious to increase his activities.  He does still have some palpitations from atrial fibrillation and still has some incisional pain.  He is not requiring any narcotics.  He is anxious to get his prostate cancer treatment started.  Past Medical History:  Diagnosis Date   Anemia    Anticoagulant long-term use    changed from eliquis to praxada 06/ 2022--- mananged by cardiology   BPH with urinary obstruction    CAD (coronary artery disease)    cardiologist--- dr g. taylor--- cath 04-22-2011  moderate LM stenosis, borderline sig pRCA, sig stenosis mid to distal LAD ;  aggressive medical therapy   Chronic diastolic CHF (congestive heart failure) ([place]) 03/2018   followed by cardiology   CKD (chronic kidney disease), stage II    COPD (chronic obstructive pulmonary disease) ([place])    followed by pcp   DOE (dyspnea on exertion)    per pt when walk >100yds,  per pt rides outside bike 4-8 miles daily without sob,  household chores and yard work without sob   Eczema    GERD (gastroesophageal reflux disease)    Glaucoma, left eye    Heart murmur    Hemorrhoids    History of adenomatous polyp of colon     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 Bryan Lemma--- dx by biopsy 01-28-2021   Macular degeneration of both eyes    Osteoporosis 05/31/2016   PAF (paroxysmal atrial fibrillation) ([place]) 03/2012   cardiologist-- dr g. taylor;  first dx 06/ 2013 AFlutter w/ RVR   Urethral lesion    Vitamin D deficiency     Current Outpatient Medications  Medication Sig Dispense Refill   aspirin EC 81 MG EC tablet Take 1 tablet (81 mg total) by mouth daily. Swallow whole. 30 tablet 11   Baclofen 5 MG TABS Take 5 mg  by mouth 3 (three) times daily as needed (for hiccups). 30 tablet 1   brimonidine (ALPHAGAN) 0.2 % ophthalmic solution Place 1 drop into the left eye in the morning and at bedtime.   7   budesonide (ENTOCORT EC) 3 MG 24 hr capsule Take 2 capsules (6 mg total) by mouth daily for 42 days, THEN 1 capsule (3 mg total) daily. 140 capsule 0   clobetasol (TEMOVATE) 0.05 % external solution APPLY 1 APPLICATION TOPICALLY TWICE DAILY 100 mL 3   dabigatran (PRADAXA) 150 MG CAPS capsule Take 1 capsule (150 mg total) by mouth 2 (two) times daily. 180 capsule 3   Evolocumab (REPATHA SURECLICK) 151 MG/ML SOAJ Inject 140 mg into the skin every 14 (fourteen) days. 6 mL 3   ferrous sulfate 325 (65 FE) MG tablet Take 325 mg by mouth daily with breakfast.     latanoprost (XALATAN) 0.005 % ophthalmic solution Place 1 drop into the left eye at  bedtime.      levalbuterol (XOPENEX HFA) 45 MCG/ACT inhaler INHALE 2 PUFFS BY MOUTH INTO THE LUNGS EVERY 4 HOURS AS NEEDED FOR WHEEZING 45 g 2   loperamide (IMODIUM) 2 MG capsule TAKE 1 CAPSULE(2 MG) BY MOUTH EVERY 6 HOURS AS NEEDED FOR DIARRHEA OR LOOSE STOOLS 30 capsule 0   MAGnesium-Oxide 400 (240 Mg) MG tablet Take 1 tablet (400 mg total) by mouth 3 (three) times daily. 270 tablet 1   metoprolol tartrate (LOPRESSOR) 25 MG tablet TAKE 1 TABLET BY MOUTH  TWICE DAILY (Patient taking differently: Take 25 mg by mouth 2 (two) times daily.) 180 tablet 3   nitroGLYCERIN (NITROSTAT) 0.4 MG SL tablet Place 1 tablet (0.4 mg total) under the tongue every 5 (five) minutes as needed for chest pain. 30 tablet 0   omeprazole (PRILOSEC OTC) 20 MG tablet Take 20 mg by mouth daily.     propafenone (RYTHMOL) 225 MG tablet TAKE 1 TABLET BY MOUTH  TWICE DAILY (Patient taking differently: Take 225 mg by mouth 2 (two) times daily.) 180 tablet 2   Ranibizumab (LUCENTIS IO) Inject 1 Dose into the eye once as needed (Macular degeneration).     tamsulosin (FLOMAX) 0.4 MG CAPS capsule Take 0.4 mg by mouth 2 (two) times daily.     Ferrous Fumarate (HEMOCYTE) 324 (106 Fe) MG TABS tablet Take 1 tablet (106 mg of iron total) by mouth daily. (Patient not taking: Reported on 10/07/2021) 30 tablet 3   No current facility-administered medications for this visit.    Physical Exam BP 121/69 (BP Location: Left Arm, Patient Position: Sitting, Cuff Size: Large)    Pulse 82    Resp 20    Ht 5\' 4"  (1.626 m)    Wt 168 lb (76.2 kg)    SpO2 99% Comment: RA   BMI 28.84 kg/m  Well-appearing 76 year old man in no acute distress Alert and oriented x3 with no focal deficits Lungs clear with equal breath sounds bilaterally Sternum stable, incision well-healed Cardiac regular rate and rhythm No peripheral edema  Diagnostic Tests: None  Impression: Owens "Milbert Coulter" Woodmansee is a 76 year old man with a history of CAD, hypertension, chronic kidney  disease, COPD, chronic diastolic CHF, paroxysmal atrial fibrillation, BPH, and prostate cancer.   He developed unstable coronary syndrome around the time of a TURP back in October.  He was found to have a 90% left main stenosis.  He underwent coronary bypass grafting x4 on 07/22/2021.  His postoperative course was complicated by atrial fibrillation.  He is  currently on Pradaxa.  From surgical standpoint he is doing well.  There are no restrictions on his activities at this point.  He was cautioned to build into new activities gradually.  He sees Dr. Lovena Le next week.  His insurance, he wants to switch his anticoagulation.  He will discuss that with Dr. Lovena Le.  From my standpoint he is okay to begin his treatment for prostate cancer.  He will also discuss that with Dr. Lovena Le.  Plan: Follow-up with Dr. Lovena Le as scheduled I will be happy to see him back anytime in the future if I can be of any further assistance with his care  Melrose Nakayama, MD [facility] [phone]

## 2021-10-10 DIAGNOSIS — Z5111 Encounter for antineoplastic chemotherapy: Secondary | ICD-10-CM | POA: Diagnosis not present

## 2021-10-10 DIAGNOSIS — C61 Malignant neoplasm of prostate: Secondary | ICD-10-CM | POA: Diagnosis not present

## 2021-10-12 ENCOUNTER — Other Ambulatory Visit: Payer: Self-pay | Admitting: Family Medicine

## 2021-10-13 ENCOUNTER — Other Ambulatory Visit: Payer: Self-pay

## 2021-10-13 ENCOUNTER — Other Ambulatory Visit: Payer: Self-pay | Admitting: Family Medicine

## 2021-10-13 ENCOUNTER — Telehealth: Payer: Self-pay | Admitting: Family Medicine

## 2021-10-13 ENCOUNTER — Other Ambulatory Visit: Payer: Medicare Other | Admitting: *Deleted

## 2021-10-13 DIAGNOSIS — E782 Mixed hyperlipidemia: Secondary | ICD-10-CM | POA: Diagnosis not present

## 2021-10-13 LAB — LIPID PANEL
Chol/HDL Ratio: 2.2 ratio (ref 0.0–5.0)
Cholesterol, Total: 121 mg/dL (ref 100–199)
HDL: 56 mg/dL (ref >39)
LDL Chol Calc (NIH): 51 mg/dL (ref 0–99)
Triglycerides: 70 mg/dL (ref 0–149)
VLDL Cholesterol Cal: 14 mg/dL (ref 5–40)

## 2021-10-13 LAB — HEPATIC FUNCTION PANEL
ALT: 7 IU/L (ref 0–44)
AST: 15 IU/L (ref 0–40)
Albumin: 3.8 g/dL (ref 3.7–4.7)
Alkaline Phosphatase: 66 IU/L (ref 44–121)
Bilirubin Total: <0.2 mg/dL (ref 0.0–1.2)
Bilirubin, Direct: <0.1 mg/dL (ref 0.00–0.40)
Total Protein: 6.3 g/dL (ref 6.0–8.5)

## 2021-10-13 MED ORDER — FUROSEMIDE 20 MG PO TABS: 20.0000 mg | ORAL_TABLET | Freq: Every day | ORAL | 2 refills | Status: DC | PRN

## 2021-10-13 NOTE — Telephone Encounter (Signed)
Medication: furosemide (lasix)  Has the patient contacted their pharmacy? Yes.     Preferred Pharmacy: [facility]

## 2021-10-14 ENCOUNTER — Encounter: Payer: Self-pay | Admitting: Internal Medicine

## 2021-10-14 ENCOUNTER — Encounter: Payer: Self-pay | Admitting: Family Medicine

## 2021-10-14 ENCOUNTER — Other Ambulatory Visit: Payer: Self-pay | Admitting: Family Medicine

## 2021-10-14 ENCOUNTER — Other Ambulatory Visit: Payer: Self-pay

## 2021-10-14 ENCOUNTER — Ambulatory Visit (INDEPENDENT_AMBULATORY_CARE_PROVIDER_SITE_OTHER): Payer: Medicare Other | Admitting: Internal Medicine

## 2021-10-14 VITALS — BP 112/66 | HR 6 | Ht 64.0 in | Wt 160.2 lb

## 2021-10-14 DIAGNOSIS — I2510 Atherosclerotic heart disease of native coronary artery without angina pectoris: Secondary | ICD-10-CM | POA: Diagnosis not present

## 2021-10-14 DIAGNOSIS — I480 Paroxysmal atrial fibrillation: Secondary | ICD-10-CM | POA: Diagnosis not present

## 2021-10-14 MED ORDER — APIXABAN 5 MG PO TABS: 5.0000 mg | ORAL_TABLET | Freq: Two times a day (BID) | ORAL | 3 refills | Status: DC

## 2021-10-14 NOTE — Patient Instructions (Addendum)
Medication Instructions:  Your physician has recommended you make the following change in your medication:    STOP propafenone  2.    STOP Pradaxa when you run out  3.    START taking Eliquis 5 mg-  Take one tablet by mouth twice a day   Labwork: None ordered.  Testing/Procedures: None ordered.  Follow-Up: Your physician wants you to follow-up in: 6 months with Cristopher Peru, MD   Any Other Special Instructions Will Be Listed Below (If Applicable).  If you need a refill on your cardiac medications before your next appointment, please call your pharmacy.

## 2021-10-14 NOTE — Progress Notes (Signed)
HPI [name] returns today for followup. He is a pleasant 76 yo man with persistent atrial fib and CAD, s/p CABG and now prostate CA. He is undergoing XRT soon. He has a h/o stroke and is on pradaxa and is in the process of switching to eliquis. He denies palpitations and does not feel his atrial fib unless he lies on his left side.  Allergies  Allergen Reactions   Albumin (Human) Anaphylaxis   Polymyxin B-Trimethoprim Swelling    Eye drops made eyes swell   Pseudoephedrine Other (See Comments)    Stomach cramps   Codeine Hives, Itching and Rash   Guaiacol Other (See Comments)    Hallucinations   Statins Other (See Comments)    Muscle cramps    Gabapentin Other (See Comments)    Pt unsure of sensitivity   Meloxicam Other (See Comments)    Pt unsure of sensitivity   Peppermint Flavor Other (See Comments)    Severe cramping   Pseudoephedrine-Guaifenesin Nausea And Vomiting    Stomach cramps   Rosuvastatin Calcium Other (See Comments)    Muscle aches   Tapentadol Other (See Comments)    Pt unsure of sensitivity   Ciprofloxacin Hives, Itching, Nausea Only and Rash   Moxifloxacin Nausea Only and Other (See Comments)    Headaches, stomach cramps    Rofecoxib Other (See Comments)    Stomach cramping     Current Outpatient Medications  Medication Sig Dispense Refill   aspirin EC 81 MG EC tablet Take 1 tablet (81 mg total) by mouth daily. Swallow whole. 30 tablet 11   Baclofen 5 MG TABS Take 5 mg by mouth 3 (three) times daily as needed (for hiccups). 30 tablet 1   brimonidine (ALPHAGAN) 0.2 % ophthalmic solution Place 1 drop into the left eye in the morning and at bedtime.   7   budesonide (ENTOCORT EC) 3 MG 24 hr capsule Take 2 capsules (6 mg total) by mouth daily for 42 days, THEN 1 capsule (3 mg total) daily. 140 capsule 0   clobetasol (TEMOVATE) 0.05 % external solution APPLY 1 APPLICATION TOPICALLY TWICE DAILY 100 mL 3   dabigatran (PRADAXA) 150 MG CAPS capsule  Take 1 capsule (150 mg total) by mouth 2 (two) times daily. 180 capsule 3   Evolocumab (REPATHA SURECLICK) 817 MG/ML SOAJ Inject 140 mg into the skin every 14 (fourteen) days. 6 mL 3   Ferrous Fumarate (HEMOCYTE) 324 (106 Fe) MG TABS tablet Take 1 tablet (106 mg of iron total) by mouth daily. 30 tablet 3   ferrous sulfate 325 (65 FE) MG tablet Take 325 mg by mouth daily with breakfast.     furosemide (LASIX) 20 MG tablet TAKE 1 TABLET BY MOUTH  DAILY 30 tablet 2   latanoprost (XALATAN) 0.005 % ophthalmic solution Place 1 drop into the left eye at bedtime.      levalbuterol (XOPENEX HFA) 45 MCG/ACT inhaler INHALE 2 PUFFS BY MOUTH INTO THE LUNGS EVERY 4 HOURS AS NEEDED FOR WHEEZING 45 g 2   loperamide (IMODIUM) 2 MG capsule TAKE 1 CAPSULE(2 MG) BY MOUTH EVERY 6 HOURS AS NEEDED FOR DIARRHEA OR LOOSE STOOLS 30 capsule 0   MAGnesium-Oxide 400 (240 Mg) MG tablet Take 1 tablet (400 mg total) by mouth 3 (three) times daily. 270 tablet 1   metoprolol tartrate (LOPRESSOR) 25 MG tablet TAKE 1 TABLET BY MOUTH  TWICE DAILY (Patient taking differently: Take 25 mg by mouth 2 (two) times  daily.) 180 tablet 3   nitroGLYCERIN (NITROSTAT) 0.4 MG SL tablet Place 1 tablet (0.4 mg total) under the tongue every 5 (five) minutes as needed for chest pain. 30 tablet 0   omeprazole (PRILOSEC OTC) 20 MG tablet Take 20 mg by mouth daily.     propafenone (RYTHMOL) 225 MG tablet TAKE 1 TABLET BY MOUTH  TWICE DAILY (Patient taking differently: Take 225 mg by mouth 2 (two) times daily.) 180 tablet 2   Ranibizumab (LUCENTIS IO) Inject 1 Dose into the eye once as needed (Macular degeneration).     tamsulosin (FLOMAX) 0.4 MG CAPS capsule Take 0.4 mg by mouth 2 (two) times daily.     No current facility-administered medications for this visit.     Past Medical History:  Diagnosis Date   Anemia    Anticoagulant long-term use    changed from eliquis to praxada 06/ 2022--- mananged by cardiology   BPH with urinary obstruction     CAD (coronary artery disease)    cardiologist--- dr g. Tanganika Barradas--- cath 04-22-2011  moderate LM stenosis, borderline sig pRCA, sig stenosis mid to distal LAD ;  aggressive medical therapy   Chronic diastolic CHF (congestive heart failure) ([place]) 03/2018   followed by cardiology   CKD (chronic kidney disease), stage II    COPD (chronic obstructive pulmonary disease) ([place])    followed by pcp   DOE (dyspnea on exertion)    per pt when walk >100yds,  per pt rides outside bike 4-8 miles daily without sob,  household chores and yard work without sob   Eczema    GERD (gastroesophageal reflux disease)    Glaucoma, left eye    Heart murmur    Hemorrhoids    History of adenomatous polyp of colon    History of coma 1962   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 Bryan Lemma--- dx by biopsy 01-28-2021   Macular degeneration of both eyes    Osteoporosis 05/31/2016   PAF (paroxysmal atrial fibrillation) ([place]) 03/2012   cardiologist-- dr g. Lemon Sternberg;  first dx 06/ 2013 AFlutter w/ RVR   Urethral lesion    Vitamin D deficiency     ROS:   All systems reviewed and negative except as noted in the HPI.   Past Surgical History:  Procedure Laterality Date   ANTERIOR CERVICAL DECOMP/DISCECTOMY FUSION  06/08/2002   @[facility] ;  C5--C7   APPENDECTOMY  1953   CARDIAC CATHETERIZATION  04/22/2011   moderate left main and RCA stenosis not significant by FFR and  IVUS on medical therapy   CARDIOVERSION N/A 04/25/2018   Procedure: CARDIOVERSION;  Surgeon: Skeet Latch, MD;  Location: [facility];  Service: Cardiovascular;  Laterality: N/A;   CATARACT EXTRACTION W/ INTRAOCULAR LENS IMPLANT Bilateral 2021   COLONOSCOPY WITH ESOPHAGOGASTRODUODENOSCOPY (EGD)  01/28/2021  by [facility]   CORONARY ARTERY BYPASS GRAFT N/A 07/22/2021   Procedure: 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, ON PUMP, USING LEFT INTERNAL MAMMARY ARTERY AND ENDOSCOPICALLY HARVESTED RIGHT GREATER SAPHENOUS VEIN;  Surgeon: Melrose Nakayama, MD;  Location: [facility];  Service: Open Heart Surgery;  Laterality: N/A;   ENDOVEIN HARVEST OF GREATER SAPHENOUS VEIN  07/22/2021   Procedure: ENDOVEIN HARVEST OF GREATER SAPHENOUS VEIN;  Surgeon: Melrose Nakayama, MD;  Location: [facility];  Service: Open Heart Surgery;;   EXTRACORPOREAL SHOCK WAVE LITHOTRIPSY     x2  1990s   FOOT SURGERY Right 1970   calcification removed from top of foot   KNEE ARTHROSCOPY Right [place]   LEFT HEART CATH AND CORONARY ANGIOGRAPHY N/A 07/21/2021   Procedure: LEFT HEART CATH AND CORONARY ANGIOGRAPHY;  Surgeon: Lorretta Harp, MD;  Location: [facility];  Service: Cardiovascular;  Laterality: N/A;   TEE WITHOUT CARDIOVERSION N/A 07/22/2021   Procedure: TRANSESOPHAGEAL ECHOCARDIOGRAM (TEE);  Surgeon: Melrose Nakayama, MD;  Location: [facility];  Service: Open Heart Surgery;  Laterality: N/A;   TOTAL KNEE ARTHROPLASTY Right 08/14/2009   @[facility]    TRANSURETHRAL RESECTION OF PROSTATE N/A 07/17/2021   Procedure: TRANSURETHRAL RESECTION OF THE PROSTATE (TURP);  Surgeon: [name], MD;  Location[facility];  Service: Urology;  Laterality: N/A;  1 HR     Family History  Problem Relation Age of Onset   Heart disease Mother    Diabetes Mother    Cirrhosis Mother    Emphysema Mother        never smoked but 2nd hand through her spouse    Hypertension Mother    Macular degeneration Mother    Heart disease Father    Cancer Father        prostate   Hyperlipidemia Father    Hypertension Father    Varicose Veins Father    Heart attack Father    Peripheral vascular disease Father    Heart disease Sister    Arthritis Sister    Hyperlipidemia Sister    Obesity Sister    Macular degeneration Sister    Heart disease Brother        5 stents   Hyperlipidemia Brother    Macular degeneration Maternal Grandfather    Cirrhosis Sister    Obesity Sister    Arthritis Sister    Heart disease Sister    Obesity Sister    Liver disease Other    Prostate cancer Other    Coronary artery disease Other    Colon cancer Neg Hx    Esophageal cancer Neg Hx    Rectal cancer Neg Hx    Stomach cancer Neg Hx      Social History   Socioeconomic History   Marital status: Married    Spouse name: Thayer Headings   Number of children: 0   Years of education: Not on file   Highest education level: Not on file  Occupational History   Occupation: retired    Fish farm manager: RETIRED  Tobacco Use   Smoking status: Former    Packs/day: 0.50    Years: 50.00    Pack years: 25.00    Types: Cigarettes    Quit date: 05/01/2009    Years since quitting: 12.4   Smokeless tobacco: Former    Types: Chew    Quit date: 1995  Vaping Use   Vaping Use: Never used  Substance and Sexual Activity  Alcohol use: Yes    Alcohol/week: 2.0 standard drinks    Types: 2 Cans of beer per week    Comment: 1 beer daily   Drug use: Never   Sexual activity: Yes    Comment: lives with wife, no dietary restrictions  Other Topics Concern   Not on file  Social History Narrative   Lives with wife   Social Determinants of Health   Financial Resource Strain: Not on file  Food Insecurity: Not on file  Transportation Needs: Not on file  Physical Activity: Not on file  Stress: Not on file  Social Connections: Not on file  Intimate Partner Violence: Not on file     BP  112/66    Pulse (!) 6    Ht 5\' 4"  (1.626 m)    Wt 160 lb 3.2 oz (72.7 kg)    SpO2 96%    BMI 27.50 kg/m   Physical Exam:  Well appearing NAD HEENT: Unremarkable Neck:  No JVD, no thyromegally Lymphatics:  No adenopathy Back:  No CVA tenderness Lungs:  Clear with no wheezes HEART:  Regular rate rhythm, no murmurs, no rubs, no clicks Abd:  soft, positive bowel sounds, no organomegally, no rebound, no guarding Ext:  2 plus pulses, no edema, no cyanosis, no clubbing Skin:  No rashes no nodules Neuro:  CN II through XII intact, motor grossly intact  EKG - atrial fib/flutter with a CVR  Assess/Plan:   Atrial fib/flutter - At this point he is minimally symptomatic and he will stop the rhythmol. He will continue his beta blocker for rate control.  Coags - he will switch to eliquis when he runs of of Pradaxa. He is not bleeding CAD - he is s/p CABG.  HTN - his bp is better today. Continue beta blocker.

## 2021-10-16 NOTE — Addendum Note (Signed)
Addended by: Gwendlyn Deutscher on: 10/16/2021 02:18 PM   Modules accepted: Orders

## 2021-10-17 ENCOUNTER — Other Ambulatory Visit: Payer: Self-pay | Admitting: Internal Medicine

## 2021-10-17 ENCOUNTER — Telehealth: Payer: Self-pay | Admitting: Internal Medicine

## 2021-10-17 DIAGNOSIS — I4891 Unspecified atrial fibrillation: Secondary | ICD-10-CM

## 2021-10-17 NOTE — Telephone Encounter (Signed)
Pt c/o medication issue:  1. Name of Medication: apixaban (ELIQUIS) 5 MG TABS tablet  2. How are you currently taking this medication (dosage and times per day)? Patient has not started taking the medication yet   3. Are you having a reaction (difficulty breathing--STAT)?   4. What is your medication issue? Cost   Patient said a 90 day supply of the medication would cost him over $600. HE can not afford

## 2021-10-17 NOTE — Telephone Encounter (Signed)
Returned call to Pt.  Per Pt a 90 day supply of Eliquis is $600 and is too costly for him.  Will discuss with Jeani Hawking with patient assistance.

## 2021-10-17 NOTE — Telephone Encounter (Signed)
**Note De-Identified  Obfuscation** No answer so I left a message on the pts VM (Okay per DPR) advising him to call [facility] at [phone] to ask questions about their Eliquis program and his eligibility to be approved for the program and that if they advise him that he is eligible to request that they mail him an application to his home address or to go online to their web site and print one off.  I did advise in the message that when he gets his application, to complete his part, obtain required documents per [employer], and to bring all to Dr Forde Dandy office at [facility] on [address] in [place] to drop off in the front office and that we will take care of the providers page and will fax all to [employer].  I also advised that if [employer] is not an options for him to please call Jeani Hawking back at [phone].

## 2021-10-20 ENCOUNTER — Ambulatory Visit (INDEPENDENT_AMBULATORY_CARE_PROVIDER_SITE_OTHER): Payer: Medicare Other | Admitting: Adult Health

## 2021-10-20 ENCOUNTER — Encounter: Payer: Self-pay | Admitting: Adult Health

## 2021-10-20 VITALS — BP 134/81 | HR 94 | Ht 64.0 in | Wt 162.0 lb

## 2021-10-20 DIAGNOSIS — I480 Paroxysmal atrial fibrillation: Secondary | ICD-10-CM | POA: Diagnosis not present

## 2021-10-20 DIAGNOSIS — E785 Hyperlipidemia, unspecified: Secondary | ICD-10-CM

## 2021-10-20 DIAGNOSIS — I10 Essential (primary) hypertension: Secondary | ICD-10-CM

## 2021-10-20 DIAGNOSIS — G459 Transient cerebral ischemic attack, unspecified: Secondary | ICD-10-CM

## 2021-10-20 NOTE — Patient Instructions (Addendum)
Continue aspirin 81 mg daily and Eliquis (apixaban) daily  and Repatha for secondary stroke prevention  Continue to follow routinely with cardiology as scheduled  Continue to follow up with PCP regarding cholesterol and blood pressure management  Maintain strict control of hypertension with blood pressure goal below 130/90 and cholesterol with LDL cholesterol (bad cholesterol) goal below 70 mg/dL.   Signs of a Stroke? Follow the BEFAST method:  Balance Watch for a sudden loss of balance, trouble with coordination or vertigo Eyes Is there a sudden loss of vision in one or both eyes? Or double vision?  Face: Ask the person to smile. Does one side of the face droop or is it numb?  Arms: Ask the person to raise both arms. Does one arm drift downward? Is there weakness or numbness of a leg? Speech: Ask the person to repeat a simple phrase. Does the speech sound slurred/strange? Is the person confused ? Time: If you observe any of these signs, call 911.   Recently followed by routinely follows with urology prior prior    Thank you for coming to see [place] at [facility]. I hope we have been able to provide you high quality care today.  You may receive a patient satisfaction survey over the next few weeks. We would appreciate your feedback and comments so that we may continue to improve ourselves and the health of our patients.

## 2021-10-20 NOTE — Progress Notes (Signed)
[facility] [address] [place]. [place] [address] [phone]       STROKE FOLLOW UP NOTE  Ryan [name] Date of Birth:  06-16-1946 Medical Record Number:  [id number]   Reason for Referral: stroke follow up    SUBJECTIVE:   CHIEF COMPLAINT:  Chief Complaint  Patient presents with   Follow-up    RM 3 with spouse  Ryan [name]   Pt is well and stable, no new concerns.     HPI:   Update 10/20/2021 Ryan [name]: Returns for follow-up after prior visit 5 months ago accompanied by his wife.  Stable from stroke standpoint without new or recurring stroke/TIA symptoms.  Currently on Eliquis 5 mg twice daily, aspirin 81 mg daily and Repatha with mild bruising but no other side effects.  Blood pressure today 134/81. No new neurological concerns today.  Other issues:  S/p TURP 07/17/2021 with path revealing prostatic adenocarcinoma. Post op course complicated by hematuria and urinary retention and admitted 10/16 for the same and subsequently developed chest tightness/fullness and ultimately underwent emergent CABG x4 10/18 by Dr. Roxan Hockey - placed on aspirin 81mg  daily. He has since been released by cardiothoracic and routinely follows with Dr. Lovena Le.  Recently switched back to Eliquis from Pradaxa due to insurance reasons. Routinely followed by urology and oncology. Is hopeful to start radiation next month for 8 week course.  Routinely follows with ophthalmology with routine injections for macular degeneration    History provided for reference purposes only Initial visit 05/05/2021 Ryan [name]: Ryan [name] is being seen for hospital follow-up accompanied by his wife.  Stable from stroke standpoint without new or reoccurring stroke/TIA symptoms.  He did present to ED on 6/23 with dizziness which improved after IVF.  He has not had any additional symptoms since that time.  He has remained on Pradaxa without associated side effects.  Blood pressure today 150/68. F/u visit with Dr. Lovena Le  8/12.  He does have history of right leg radiculopathy receiving injections every 4 to 5 months by [place] [facility].  Routinely followed by ophthalmology for macular degeneration receiving Lucentis injections routinely - pt reports discussing increased risk of stroke with Lucentis injections with ophthalmologist at recent visit but per OV note, patient wished to continue receiving injections.  No further concerns at this time.  Stroke admission 03/23/2021 Ryan [name] is a 76 y.o. male with a PMHx of atrial fibrillation (on Eliquis), CAD, CHF, rheumatic fever as a child, heart murmur, HLD and HTN, who presented to the ED on 03/23/2021 after experiencing acute onset of LLE weakness at noon. He had been bicycling and afterwards was in his workshop when he experienced sudden loss of movement of his left leg.  Personally reviewed hospitalization pertinent progress notes, lab work and imaging with summary provided.  Evaluated by Dr. Leonie Man for strokelike episode possible TIA in setting of known PAF.  MR brain negative.  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  Carotid duplex 1 to 39% stenosis.  EF 50 to 55%.  On Eliquis PTA recommend potentially switching to Pradaxa for secondary stroke prevention measures.  LDL 221 with history of statin intolerance and consider Repatha or Praluent outpatient.  A1c 5.5.  PT/OT recommended outpatient PT.     ROS:   14 system review of systems performed and negative with exception of those listed in HPI  PMH:  Past Medical History:  Diagnosis Date   Anemia    Anticoagulant long-term use  changed from eliquis to praxada 06/ 2022--- mananged by cardiology   BPH with urinary obstruction    CAD (coronary artery disease)    cardiologist--- dr g. taylor--- cath 04-22-2011  moderate LM stenosis, borderline sig pRCA, sig stenosis mid to distal LAD ;  aggressive medical therapy   Chronic diastolic CHF (congestive heart failure)  ([place]) 03/2018   followed by cardiology   CKD (chronic kidney disease), stage II    COPD (chronic obstructive pulmonary disease) ([place])    followed by pcp   DOE (dyspnea on exertion)    per pt when walk >100yds,  per pt rides outside bike 4-8 miles daily without sob,  household chores and yard work without sob   Eczema    GERD (gastroesophageal reflux disease)    Glaucoma, left eye    Heart murmur    Hemorrhoids    History of adenomatous polyp of colon    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 Bryan Lemma--- dx by biopsy 01-28-2021   Macular degeneration of both eyes    Osteoporosis 05/31/2016   PAF (paroxysmal atrial fibrillation) ([place]) 03/2012   cardiologist-- dr g. taylor;  first dx 06/ 2013 AFlutter w/ RVR   Urethral lesion    Vitamin D deficiency     PSH:  Past Surgical History:  Procedure Laterality Date   ANTERIOR CERVICAL DECOMP/DISCECTOMY FUSION  06/08/2002   @[facility] ;  C5--C7   [place]  04/22/2011   moderate left main and RCA stenosis not significant by FFR and IVUS on medical therapy   CARDIOVERSION N/A 04/25/2018   Procedure: CARDIOVERSION;  Surgeon: Skeet Latch, MD;  Location: [facility];  Service: Cardiovascular;  Laterality: N/A;   CATARACT EXTRACTION W/ INTRAOCULAR LENS IMPLANT Bilateral 2021   COLONOSCOPY WITH ESOPHAGOGASTRODUODENOSCOPY (EGD)   01/28/2021   by [facility]   CORONARY ARTERY BYPASS GRAFT N/A 07/22/2021   Procedure: 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, ON PUMP, USING LEFT INTERNAL MAMMARY ARTERY AND ENDOSCOPICALLY HARVESTED RIGHT GREATER SAPHENOUS VEIN;  Surgeon: Melrose Nakayama, MD;  Location: [facility];  Service: Open Heart Surgery;  Laterality: N/A;   ENDOVEIN HARVEST OF GREATER SAPHENOUS VEIN  07/22/2021   Procedure: ENDOVEIN HARVEST OF GREATER SAPHENOUS VEIN;  Surgeon: Melrose Nakayama, MD;  Location: [facility];  Service: Open Heart Surgery;;   EXTRACORPOREAL SHOCK WAVE LITHOTRIPSY     x2  1990s   FOOT SURGERY Right 1970   calcification removed from top of foot   KNEE ARTHROSCOPY Right [place]   LEFT HEART CATH AND CORONARY ANGIOGRAPHY N/A 07/21/2021   Procedure: LEFT HEART CATH AND CORONARY ANGIOGRAPHY;  Surgeon: Lorretta Harp, MD;  Location: [facility];  Service: Cardiovascular;  Laterality:  N/A;   TEE WITHOUT CARDIOVERSION N/A 07/22/2021   Procedure: TRANSESOPHAGEAL ECHOCARDIOGRAM (TEE);  Surgeon: Melrose Nakayama, MD;  Location: [facility];  Service: Open Heart Surgery;  Laterality: N/A;   TOTAL KNEE ARTHROPLASTY Right 08/14/2009   @[facility]    TRANSURETHRAL RESECTION OF PROSTATE N/A 07/17/2021   Procedure: TRANSURETHRAL RESECTION OF THE PROSTATE (TURP);  Surgeon: [name], MD;  Location: [facility];  Service: Urology;  Laterality: N/A;  1 HR    Social History:  Social History   Socioeconomic History   Marital status: Married    Spouse name: Ryan [name]   Number of children: 0   Years of education: Not on file   Highest education level: Not on file  Occupational History   Occupation: retired    Fish farm manager: RETIRED  Tobacco Use   Smoking status: Former    Packs/day: 0.50    Years: 50.00    Pack years: 25.00    Types: Cigarettes    Quit date: 05/01/2009    Years since quitting: 12.4   Smokeless tobacco: Former    Types: Chew     Quit date: 1995  Vaping Use   Vaping Use: Never used  Substance and Sexual Activity   Alcohol use: Yes    Alcohol/week: 2.0 standard drinks    Types: 2 Cans of beer per week    Comment: 1 beer daily   Drug use: Never   Sexual activity: Yes    Comment: lives with wife, no dietary restrictions  Other Topics Concern   Not on file  Social History Narrative   Lives with wife   Social Determinants of Health   Financial Resource Strain: Not on file  Food Insecurity: Not on file  Transportation Needs: Not on file  Physical Activity: Not on file  Stress: Not on file  Social Connections: Not on file  Intimate Partner Violence: Not on file    Family History:  Family History  Problem Relation Age of Onset   Heart disease Mother    Diabetes Mother    Cirrhosis Mother    Emphysema Mother        never smoked but 2nd hand through her spouse   Hypertension Mother    Macular degeneration Mother    Heart disease Father    Cancer Father        prostate   Hyperlipidemia Father    Hypertension Father    Varicose Veins Father    Heart attack Father    Peripheral vascular disease Father    Heart disease Sister    Arthritis Sister    Hyperlipidemia Sister    Obesity Sister    Macular degeneration Sister    Heart disease Brother        5 stents   Hyperlipidemia Brother    Macular degeneration Maternal Grandfather    Cirrhosis Sister    Obesity Sister    Arthritis Sister    Heart disease Sister    Obesity Sister    Liver disease Other    Prostate cancer Other    Coronary artery disease Other    Colon cancer Neg Hx    Esophageal cancer Neg Hx    Rectal cancer Neg Hx    Stomach cancer Neg Hx     Medications:   Current Outpatient Medications on File Prior to Visit  Medication Sig Dispense Refill   apixaban (ELIQUIS) 5 MG TABS tablet Take 1 tablet (5 mg total) by mouth 2 (two) times daily. 180 tablet  3   aspirin EC 81 MG EC tablet Take 1 tablet (81 mg total) by mouth daily.  Swallow whole. 30 tablet 11   Baclofen 5 MG TABS Take 5 mg by mouth 3 (three) times daily as needed (for hiccups). 30 tablet 1   brimonidine (ALPHAGAN) 0.2 % ophthalmic solution Place 1 drop into the left eye in the morning and at bedtime.   7   budesonide (ENTOCORT EC) 3 MG 24 hr capsule Take 2 capsules (6 mg total) by mouth daily for 42 days, THEN 1 capsule (3 mg total) daily. 140 capsule 0   clobetasol (TEMOVATE) 0.05 % external solution APPLY 1 APPLICATION TOPICALLY TWICE DAILY 100 mL 3   Evolocumab (REPATHA SURECLICK) 427 MG/ML SOAJ Inject 140 mg into the skin every 14 (fourteen) days. 6 mL 3   Ferrous Fumarate (HEMOCYTE) 324 (106 Fe) MG TABS tablet Take 1 tablet (106 mg of iron total) by mouth daily. 30 tablet 3   ferrous sulfate 325 (65 FE) MG tablet Take 325 mg by mouth daily with breakfast.     furosemide (LASIX) 20 MG tablet TAKE 1 TABLET BY MOUTH  DAILY 30 tablet 2   latanoprost (XALATAN) 0.005 % ophthalmic solution Place 1 drop into the left eye at bedtime.      levalbuterol (XOPENEX HFA) 45 MCG/ACT inhaler INHALE 2 PUFFS BY MOUTH INTO THE LUNGS EVERY 4 HOURS AS NEEDED FOR WHEEZING 45 g 2   loperamide (IMODIUM) 2 MG capsule TAKE 1 CAPSULE(2 MG) BY MOUTH EVERY 6 HOURS AS NEEDED FOR DIARRHEA OR LOOSE STOOLS 30 capsule 0   MAGnesium-Oxide 400 (240 Mg) MG tablet Take 1 tablet (400 mg total) by mouth 3 (three) times daily. 270 tablet 1   metoprolol tartrate (LOPRESSOR) 25 MG tablet TAKE 1 TABLET BY MOUTH  TWICE DAILY (Patient taking differently: Take 25 mg by mouth 2 (two) times daily.) 180 tablet 3   nitroGLYCERIN (NITROSTAT) 0.4 MG SL tablet Place 1 tablet (0.4 mg total) under the tongue every 5 (five) minutes as needed for chest pain. 30 tablet 0   omeprazole (PRILOSEC OTC) 20 MG tablet Take 20 mg by mouth daily.     Ranibizumab (LUCENTIS IO) Inject 1 Dose into the eye once as needed (Macular degeneration).     tamsulosin (FLOMAX) 0.4 MG CAPS capsule Take 0.4 mg by mouth 2 (two) times  daily.     No current facility-administered medications on file prior to visit.    Allergies:   Allergies  Allergen Reactions   Albumin (Human) Anaphylaxis   Polymyxin B-Trimethoprim Swelling    Eye drops made eyes swell   Pseudoephedrine Other (See Comments)    Stomach cramps   Codeine Hives, Itching and Rash   Guaiacol Other (See Comments)    Hallucinations   Statins Other (See Comments)    Muscle cramps    Gabapentin Other (See Comments)    Pt unsure of sensitivity   Meloxicam Other (See Comments)    Pt unsure of sensitivity   Peppermint Flavor Other (See Comments)    Severe cramping   Pseudoephedrine-Guaifenesin Nausea And Vomiting    Stomach cramps   Rosuvastatin Calcium Other (See Comments)    Muscle aches   Tapentadol Other (See Comments)    Pt unsure of sensitivity   Ciprofloxacin Hives, Itching, Nausea Only and Rash   Moxifloxacin Nausea Only and Other (See Comments)    Headaches, stomach cramps    Rofecoxib Other (See Comments)    Stomach cramping  OBJECTIVE:  Physical Exam  Vitals:   10/20/21 0950  BP: 134/81  Pulse: 94  Weight: 162 lb (73.5 kg)  Height: 5\' 4"  (1.626 m)    Body mass index is 27.81 kg/m. No results found.  General: well developed, well nourished, very pleasant elderly Caucasian male, seated, in no evident distress Head: head normocephalic and atraumatic.   Neck: supple with no carotid or supraclavicular bruits Cardiovascular: irregular rate and rhythm, no murmurs Musculoskeletal: no deformity Skin:  no rash/petichiae Vascular:  Normal pulses all extremities   Neurologic Exam Mental Status: Awake and fully alert.  Fluent speech and language.  Oriented to place and time. Recent and remote memory intact. Attention span, concentration and fund of knowledge appropriate. Mood and affect appropriate.  Cranial Nerves: Pupils equal, briskly reactive to light. Extraocular movements full without nystagmus. Visual fields full to  confrontation. Hearing intact. Facial sensation intact. Face, tongue, palate moves normally and symmetrically.  Motor: Normal bulk and tone. Normal strength in all tested extremity muscles Sensory.: intact to touch , pinprick , position and vibratory sensation.  Coordination: Rapid alternating movements normal in all extremities. Finger-to-nose and heel-to-shin performed accurately bilaterally. Gait and Station: Arises from chair without difficulty. Stance is normal. Gait demonstrates normal stride length and balance without use of assistive device.  Reflexes: 1+ and symmetric. Toes downgoing.          ASSESSMENT: Ryan [name] is a 76 y.o. year old male with recent strokelike episode on 03/23/2021 possibly TIA consisting of left leg weakness in setting of known PAF on Eliquis.  Vascular risk factors include PAF CAD s/p CABGx4 07/2021, CHF, rheumatic fever as a child, heart murmur, HLD, HTN and new dx of prostate cancer with plans on starting XRT next month.      PLAN:  TIA: No reoccurring symptoms.  Continue aspirin 81 mg daily and Eliquis 5mg  twice daily and Repatha for secondary stroke prevention, A fib and CAD s/p CABG.  Discussed secondary stroke prevention measures and importance of close PCP follow up for aggressive stroke risk factor management. I have gone over the pathophysiology of stroke, warning signs and symptoms, risk factors and their management in some detail with instructions to go to the closest emergency room for symptoms of concern. PAF: On Eliquis 5 mg twice daily for CHA2DS2-VASc score of at least 6.  Followed by Dr. Lovena Le HTN: BP goal <130/90.  Stable on current regimen per PCP HLD: LDL goal <70. Recent LDL 51 (10/13/2021) on repatha managed by PCP. Hx of statin intolerance     Overall stable from stroke standpoint routinely followed by PCP and cardiology for aggressive stroke risk factor management - may follow up for stroke as needed at this time   CC:  PCP:  [name], MD    I spent 34 minutes of face-to-face and non-face-to-face time with patient and wife.  This included previsit chart review, lab review, study review, electronic health record documentation, patient education regarding prior TIA and likely etiology, secondary stroke prevention measures and aggressive stroke risk factor management, recent hospitalizations as above and answered all other questions to patient and wife's satisfaction  Frann Rider, AGNP-BC  [facility] [address] [place], [place] [address]  Phone [phone] Fax [phone] Note: This document was prepared with digital dictation and possible smart phrase technology. Any transcriptional errors that result from this process are unintentional.

## 2021-10-21 DIAGNOSIS — H903 Sensorineural hearing loss, bilateral: Secondary | ICD-10-CM | POA: Diagnosis not present

## 2021-10-22 ENCOUNTER — Telehealth: Payer: Self-pay | Admitting: Internal Medicine

## 2021-10-22 NOTE — Telephone Encounter (Signed)
° °  Pre-operative Risk Assessment    Patient Name: Ryan [name]  DOB: 10/30/1945 MRN: [id number]      Request for Surgical Clearance    Procedure:   Gold Seed Implant space orgel placement   Date of Surgery:  Clearance 11/28/21                                 Surgeon:  Dr. Rexene Alberts  Surgeon's Group or Practice Name:  [facility]  Phone number:  [phone] Fax number:  [phone]   Type of Clearance Requested:   - Medical  - Pharmacy:  Hold Apixaban (Eliquis) 48 hours prior Aspirin 5 days prior    Type of Anesthesia:  MAC   Additional requests/questions:   No  Ryan [name]   10/22/2021, 3:25 PM

## 2021-10-23 DIAGNOSIS — Z7901 Long term (current) use of anticoagulants: Secondary | ICD-10-CM | POA: Diagnosis not present

## 2021-10-23 DIAGNOSIS — C61 Malignant neoplasm of prostate: Secondary | ICD-10-CM | POA: Diagnosis not present

## 2021-10-23 DIAGNOSIS — H353231 Exudative age-related macular degeneration, bilateral, with active choroidal neovascularization: Secondary | ICD-10-CM | POA: Diagnosis not present

## 2021-10-23 NOTE — Telephone Encounter (Signed)
Dr. Lovena Le to review, patient has history of persistent A. fib and CAD s/p CABG in October 2022.  Since he has bypass surgery, he has been able to ambulate for more than 2 blocks away from his home and back without any exertional chest pain or worsening dyspnea.  He is clearly able to accomplish more than 4 METS of activity.  I think he should be cleared from the cardiac perspective to proceed with the low risk seed implant surgery.  Dr. Lovena Le, would you be okay with him holding the aspirin for 5 days prior to the procedure and restart as soon as possible afterward at the surgeon's discretion?  Note, patient had bypass surgery by Dr. Roxan Hockey on 07/22/2021.  According to the patient, once urology service placed the gold seed, he will need radiation therapy afterward.  He would like to move up the surgical date to as early as possible.  As of today, he is 3 months out from the bypass surgery.  Would you be okay with him moving up the urological surgery date?  Please forward your response to P CV DIV PREOP

## 2021-10-23 NOTE — Telephone Encounter (Signed)
Clinical pharmacist to review Eliquis 

## 2021-10-23 NOTE — Telephone Encounter (Signed)
I agree. Ok to hold [facility].

## 2021-10-23 NOTE — Telephone Encounter (Signed)
Patient with diagnosis of afib on Eliquis for anticoagulation.    Procedure: gold seed implant Date of procedure: 11/28/21  CHA2DS2-VASc Score = 6  This indicates a 9.7% annual risk of stroke. The patient's score is based upon: CHF History: 1 HTN History: 1 Diabetes History: 0 Stroke History: 2 Vascular Disease History: 1 Age Score: 1 Gender Score: 0  CrCl 16mL/min Platelet count 229K  Pt had TIA 03/23/21. Previously cleared by MD last fall to hold [place] for 2 days prior to different procedure. Per office protocol, patient can hold Eliquis for 2 days prior to procedure. He should resume as soon as safely possible after given elevated CV risk off of anticoag.

## 2021-10-28 ENCOUNTER — Other Ambulatory Visit: Payer: Self-pay | Admitting: Urology

## 2021-10-28 NOTE — Telephone Encounter (Signed)
Pt contacted by Jeani Hawking for assistance with Eliquis.  Gave information to contact for assistance.  Pt advised to call back if further needs.  Also sent mychart message with information.  Await further needs.

## 2021-11-20 DIAGNOSIS — C61 Malignant neoplasm of prostate: Secondary | ICD-10-CM | POA: Diagnosis not present

## 2021-11-20 DIAGNOSIS — Z01818 Encounter for other preprocedural examination: Secondary | ICD-10-CM | POA: Diagnosis not present

## 2021-11-25 ENCOUNTER — Other Ambulatory Visit: Payer: Self-pay

## 2021-11-25 ENCOUNTER — Encounter (HOSPITAL_BASED_OUTPATIENT_CLINIC_OR_DEPARTMENT_OTHER): Payer: Self-pay | Admitting: Urology

## 2021-11-25 NOTE — Progress Notes (Addendum)
ADDENDUM:  Chart reviewed by anesthesia, Dr Lissa Hoard MDA,  stated ok to proceed barring any acute status change.   Spoke w/ via phone for pre-op interview--- pt Lab needs dos----  [employer] results------ current ekg in epic/ chart COVID test -----patient states asymptomatic no test needed Arrive at ------- 0830 on 11-28-2021 NPO after MN NO Solid Food.  Clear liquids from MN until--- 0730 Med rec completed Medications to take morning of surgery ----- prilosec, lopressor, flomax, magenesium, entocort, eye drops as usual Diabetic medication ----- n/a Patient instructed no nail polish to be worn day of surgery Patient instructed to bring photo id and insurance card day of surgery Patient aware to have Driver (ride ) / caregiver   for 24 hours after surgery -- wife, janice Patient Special Instructions ----- will do one fleet enema morning of surgery Pre-Op special Istructions ----- tlelephone cardiac clearance by Dr Darnell Level. Taylor/ Lewis PA on 10-23-2021 in epic/ chart Patient verbalized understanding of instructions that were given at this phone interview. Patient denies chest pain, fever, cough at this phone interview.    Anesthesia Review: CAD s/p CABG x4 07-22-2021;  PAFib/ flutter on eliquis & bb;  COPD w/ doe when walking > 100 yds, pt stated rides bicycle outside 4-8 miles daily without sob as well with household/ some yard work without sob;  hx TIA 03-23-2021 while on eliquis, no resiudal;  Lymphocytic colitis Pt denies chest pain/ no rapid heart rate/ no stroke symptoms, and no peripheral swelling. Pt stated uses rescue inhaler nightly for prevention wheezing.  PCP:  Dr Chauncey Cruel. Charlett Blake (per pt 12/ 2022) Cardiologist : Dr Darnell Level. Lovena Le [facility] 10-14-2021 epic) CVTS:  Dr C. Roxan Hockey (lov 10-07-2021, pt released per note) Neurologist:  Dr Leonie Man ([place] 10-20-2021 epic) GI:  Dr Bryan Lemma Chest x-ray : 08-26-2021 EKG : 10-14-2020 Echo : TEE 07-22-2021 Stress test: nuclear 02-18-2011/   ETT 06-16-2017 Cardiac Cath :  07-21-2021 Activity level: see above Sleep Study/ CPAP : no  Blood Thinner/ Instructions Maryjane Hurter Dose: Eliquis ASA / Instructions/ Last Dose :  ASA 81mg  Per pt given instructions from dr eskridge office to stop asa week prior, last dose 11-22-2021 and told 48 hours for eliquis, stated last dose will be evening 11-26-2021

## 2021-11-27 ENCOUNTER — Telehealth: Payer: Self-pay | Admitting: *Deleted

## 2021-11-27 NOTE — H&P (Signed)
Office Visit Report     11/20/2021   --------------------------------------------------------------------------------   Ryan [name]  MRN: [id number]  DOB: 1946-04-05, 76 year old Male  SSN: [id number]   PRIMARY CARE:  Penni Homans, MD  REFERRING:  Lillette Boxer. Dahlstedt, MD  PROVIDER:  [name], M.D.  TREATING:  Jiles Crocker, NP  LOCATION:  [facility] [phone]     --------------------------------------------------------------------------------   CC/HPI: 10.13.2022: TURP for obstructing prostate. Asymmetric gland prior to the procedure, PSA was 1.8. 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 He underwent PSMA PET scan which revealed no evidence of extra prostatic disease.   He has consulted with Dr. Tyler Pita. He was initiated on LD ADT on 09/05/2021, receiving Firmagon 240 mg.   1.6.2023: He is here today for his first leuprolide injection. Well. Minimal rare terminal dysuria. No gross hematuria, no cloudy urine. Still on tamsulosin twice a day. IPSS 9, quality-of-life score 1. PSA 0.2, testosterone undetectable.   11/20/2021: Here today for preoperative appointment prior to undergoing fiducial marker placement and SpaceOAR on 2/24 in anticipation of beginning XRT after that. He received Eligard injection at time of last office visit as well. Denies any changes in past medical history since last office visit, no new prescription medications taken on a daily basis, no interval surgical or procedural intervention. He has tolerated Eligard well but does endorse some mild fatigue and occasional hot flashes. These are not significantly detrimental or limiting. He does remain on tamsulosin twice daily but voiding quite well. He does have some occasional terminal dysuria but that is stable. No interval gross hematuria. He denies any recent chest pain, shortness of breath,  nausea/vomiting, lightheadedness or dizziness.     ALLERGIES: Albumin (Human)  Avelox Codeine Polymyxin B pseudoephedrine Statins Vioxx    MEDICATIONS: Metoprolol Succinate 25 mg tablet, extended release 24 hr  Pradaxa 150 mg capsule  Tamsulosin Hcl 0.4 mg capsule 2 capsule PO Q HS  Brimonidine Tartrate 0.2 % drops  Budesonide Ec 3 mg capsule, delayed, and extended release  Clobetasol Propionate 0.05% liquid  Fluconazole 100 mg tablet  Lasix 20 mg tablet  Latanoprost 0.005 % drops  Levalbuterol Hcl  Lucentis eye injection  Nitroglycerin  Nitroglycerin 0.4 mg tablet, sublingual  Potassium Chloride 10 meq capsule, extended release  Preservision Areds 2  Prilosec Otc 20 mg tablet, delayed release 1 tablet PO Q AM  Rythmol Sr 225 mg capsule, extended release 12 hr capsule PO BID  Vitamin D3     GU PSH: Laser Surgery Prostate - 07/17/2021     NON-GU PSH: Appendectomy (laparoscopic) Bowel Surgery Procedure - 2019 Cholecystectomy (laparoscopic) Knee replacement, Right - 2010 Neck Surgery (Unspecified), fusion - 2004     GU PMH: Prostate Cancer, Very high-volume, high risk prostate cancer, initiated long-term ADT. He did have a TURP and is voiding quite well but is still on tamsulosin. He will start EBRT, 40 fractions, in about 1 month. He is doing quite well with excellent PSA/testosterone results so far. - 10/10/2021, New diagnosis of very high risk prostate cancer. PET CT scan negative for metastatic disease. He will be initiating LT ADT in addition to eventual EBRT., - 09/05/2021 BPH w/LUTS - 07/07/2021, - 03/13/2021, LUTS worse of of flomax. DRE asymmetric but benign., - 07/08/2020 Nocturia - 07/07/2021, - 07/08/2020 Straining on Urination - 07/07/2021, - 03/13/2021 Hematospermia - 03/13/2021 BPH w/o LUTS (Stable), he does well on tamsulosin  without significant side effects. He has a normal DRE today. - 2019 Abdominal Pain Unspec, I can see no urologic etiology of his pain, although  I think it may well have been pelvic floor in nature as he is tender in his levator areas bilaterally. He had a normal PSA when last checked 2 years ago. He has a normal prostate exam and no evidence of infection in his urine. - 2018    NON-GU PMH: Arrhythmia Atrial Fibrillation Cardiac murmur, unspecified GERD Heart disease, unspecified    FAMILY HISTORY: Arthritis - Runs in Family Death In The Family Father - Father Death In The Family Mother - Mother Diabetes - Runs in Family Heart Attack - Runs in Family Prostate Cancer - Father   SOCIAL HISTORY: Marital Status: Married Preferred Language: English; Ethnicity: Not Hispanic Or Latino; Race: White Current Smoking Status: Patient does not smoke anymore. Has not smoked since 08/05/2012.   Tobacco Use Assessment Completed: Used Tobacco in last 30 days? Does not use smokeless tobacco. Drinks 2 drinks per week.  Does not use drugs. Drinks 1 caffeinated drink per day. Has not had a blood transfusion.    REVIEW OF SYSTEMS:    GU Review Male:   Patient reports burning/ pain with urination and get up at night to urinate. Patient denies frequent urination, hard to postpone urination, leakage of urine, stream starts and stops, trouble starting your stream, have to strain to urinate , erection problems, and penile pain.  Gastrointestinal (Upper):   Patient denies nausea, vomiting, and indigestion/ heartburn.  Gastrointestinal (Lower):   Patient denies diarrhea and constipation.  Constitutional:   Patient reports night sweats. Patient denies fever, weight loss, and fatigue.  Skin:   Patient denies skin rash/ lesion and itching.  Eyes:   Patient denies blurred vision and double vision.  Ears/ Nose/ Throat:   Patient denies sore throat and sinus problems.  Hematologic/Lymphatic:   Patient denies swollen glands and easy bruising.  Cardiovascular:   Patient denies leg swelling and chest pains.  Respiratory:   Patient denies cough and shortness  of breath.  Endocrine:   Patient denies excessive thirst.  Musculoskeletal:   Patient reports joint pain. Patient denies back pain.  Neurological:   Patient denies headaches and dizziness.  Psychologic:   Patient denies depression and anxiety.   VITAL SIGNS:      11/20/2021 10:25 AM  Weight 162 lb / 73.48 kg  Height 65 in / 165.1 cm  BP 111/62 mmHg  Pulse 65 /min  Temperature 97.3 F / 36.2 C  BMI 27.0 kg/m   MULTI-SYSTEM PHYSICAL EXAMINATION:    Constitutional: Well-nourished. No physical deformities. Normally developed. Good grooming.  Neck: Neck symmetrical, not swollen. Normal tracheal position.  Respiratory: No labored breathing, no use of accessory muscles.   Cardiovascular: Normal temperature, normal extremity pulses, no swelling, no varicosities.  Skin: No paleness, no jaundice, no cyanosis. No lesion, no ulcer, no rash.  Neurologic / Psychiatric: Oriented to time, oriented to place, oriented to person. No depression, no anxiety, no agitation.  Gastrointestinal: No hernia. No mass, no tenderness, no rigidity, non obese abdomen.   Musculoskeletal: Normal gait and station of head and neck.     Complexity of Data:  Source Of History:  Patient, Medical Record Summary  Lab Test Review:   PSA, Total Testosterone  Records Review:   Pathology Reports, Previous Doctor Records, Previous Hospital Records, Previous Patient Records  Urine Test Review:   Urinalysis, Urine Culture   10/07/21  03/04/21 07/08/20 08/26/18 01/14/15 12/01/13 05/13/12 03/15/09  PSA  Total PSA 0.21 ng/mL 1.8 ng/ml 3.80 ng/mL 2.90 ng/mL 1.64 ng/dl 1.37 ng/dl 0.86 ng/dl 0.73 ng/dl    10/07/21  Hormones  Testosterone, Total <10 ng/dL    PROCEDURES:          Urinalysis - 81003 Dipstick Dipstick Cont'd  Color: Yellow Bilirubin: Neg  Appearance: Clear Ketones: Neg  Specific Gravity: 1.015 Blood: Neg  pH: 5.5 Protein: Neg  Glucose: Neg Urobilinogen: 0.2    Nitrites: Neg    Leukocyte Esterase: Neg     Notes:      ASSESSMENT:      ICD-10 Details  1 GU:   Prostate Cancer - C61 Chronic, Threat to Bodily Function  2 NON-GU:   Encounter for other preprocedural examination - Z01.818 Undiagnosed New Problem   PLAN:           Orders Labs Urine Culture          Schedule Return Visit/Planned Activity: Keep Scheduled Appointment - Schedule Surgery, Follow up MD          Document Letter(s):  Created for Patient: Clinical Summary         Notes:   All questions answered to the best of my ability regarding the upcoming procedure and expected postoperative course with understanding expressed by the patient. Urine culture sent today to serve as baseline. He will proceed with previously scheduled fiducial marker placement and SpaceOAR on 2/24.        Next Appointment:      Next Appointment: 11/28/2021 10:30 AM    Appointment Type: Surgery     Location: [facility] [phone]    Provider: Festus Aloe, M.D.    Reason for Visit: NE/OP PLACE FIDUCIAL MARKERS AND SPACE OAR      * Signed by Jiles Crocker, NP on 11/20/21 at 10:42 AM (EST)*      The information contained in this medical record document is considered private and confidential patient information. This information can only be used for the medical diagnosis and/or medical services that are being provided by the patient's selected caregivers. This information can only be distributed outside of the patient's care if the patient agrees and signs waivers of authorization for this information to be sent to an outside source or route.

## 2021-11-27 NOTE — Telephone Encounter (Signed)
CALLED PATIENT TO INFORM OF SIM APPT. FOR 12-01-21- ARRIVAL TIME- 9:45 AM @ [facility], INFORMED PATIENT TO ARRIVE WITH A FULL BLADDER AND AN EMPTY BOWEL, SPOKE WITH PATIENT AND HE VERIFIED UNDERSTANDING THESE APPTS.

## 2021-11-27 NOTE — Telephone Encounter (Signed)
XXXX 

## 2021-11-28 ENCOUNTER — Encounter (HOSPITAL_BASED_OUTPATIENT_CLINIC_OR_DEPARTMENT_OTHER): Payer: Self-pay | Admitting: Urology

## 2021-11-28 ENCOUNTER — Encounter (HOSPITAL_BASED_OUTPATIENT_CLINIC_OR_DEPARTMENT_OTHER)
Admission: RE | Disposition: A | Discharge status: Home / Self Care | Payer: Self-pay | Source: Home / Self Care | Attending: Urology

## 2021-11-28 ENCOUNTER — Ambulatory Visit (HOSPITAL_BASED_OUTPATIENT_CLINIC_OR_DEPARTMENT_OTHER): Payer: Medicare Other | Admitting: Anesthesiology

## 2021-11-28 ENCOUNTER — Ambulatory Visit (HOSPITAL_BASED_OUTPATIENT_CLINIC_OR_DEPARTMENT_OTHER)
Admission: RE | Admit: 2021-11-28 | Discharge: 2021-11-28 | Disposition: A | Discharge status: Home / Self Care | Payer: Medicare Other | Attending: Urology | Admitting: Urology

## 2021-11-28 DIAGNOSIS — N289 Disorder of kidney and ureter, unspecified: Secondary | ICD-10-CM | POA: Insufficient documentation

## 2021-11-28 DIAGNOSIS — I4891 Unspecified atrial fibrillation: Secondary | ICD-10-CM | POA: Insufficient documentation

## 2021-11-28 DIAGNOSIS — J449 Chronic obstructive pulmonary disease, unspecified: Secondary | ICD-10-CM | POA: Diagnosis not present

## 2021-11-28 DIAGNOSIS — I10 Essential (primary) hypertension: Secondary | ICD-10-CM | POA: Diagnosis not present

## 2021-11-28 DIAGNOSIS — Z87891 Personal history of nicotine dependence: Secondary | ICD-10-CM | POA: Diagnosis not present

## 2021-11-28 DIAGNOSIS — K219 Gastro-esophageal reflux disease without esophagitis: Secondary | ICD-10-CM | POA: Diagnosis not present

## 2021-11-28 DIAGNOSIS — C61 Malignant neoplasm of prostate: Secondary | ICD-10-CM

## 2021-11-28 DIAGNOSIS — I2510 Atherosclerotic heart disease of native coronary artery without angina pectoris: Secondary | ICD-10-CM | POA: Insufficient documentation

## 2021-11-28 DIAGNOSIS — I739 Peripheral vascular disease, unspecified: Secondary | ICD-10-CM | POA: Diagnosis not present

## 2021-11-28 HISTORY — PX: SPACE OAR INSTILLATION: SHX6769

## 2021-11-28 HISTORY — DX: Chronic kidney disease, stage 3 unspecified: N18.30

## 2021-11-28 HISTORY — PX: GOLD SEED IMPLANT: SHX6343

## 2021-11-28 LAB — POCT I-STAT, CHEM 8
BUN: 26 mg/dL — ABNORMAL HIGH (ref 8–23)
Calcium, Ion: 1.3 mmol/L (ref 1.15–1.40)
Chloride: 105 mmol/L (ref 98–111)
Creatinine, Ser: 1 mg/dL (ref 0.61–1.24)
Glucose, Bld: 101 mg/dL — ABNORMAL HIGH (ref 70–99)
HCT: 40 % (ref 39.0–52.0)
Hemoglobin: 13.6 g/dL (ref 13.0–17.0)
Potassium: 3.9 mmol/L (ref 3.5–5.1)
Sodium: 141 mmol/L (ref 135–145)
TCO2: 24 mmol/L (ref 22–32)

## 2021-11-28 SURGERY — INSERTION, GOLD SEEDS
Anesthesia: Monitor Anesthesia Care | Site: Prostate

## 2021-11-28 MED ORDER — SODIUM CHLORIDE 0.9 % IV SOLN: INTRAVENOUS | Status: DC

## 2021-11-28 MED ORDER — PROPOFOL 500 MG/50ML IV EMUL
INTRAVENOUS | Status: DC | PRN
  Administered 2021-11-28: 100 ug/kg/min via INTRAVENOUS

## 2021-11-28 MED ORDER — CEFAZOLIN SODIUM-DEXTROSE 2-4 GM/100ML-% IV SOLN
INTRAVENOUS | Status: AC
  Filled 2021-11-28: qty 100

## 2021-11-28 MED ORDER — FLEET ENEMA 7-19 GM/118ML RE ENEM
1.0000 | ENEMA | Freq: Once | RECTAL | Status: DC
Start: 2021-11-28 — End: 2021-11-28

## 2021-11-28 MED ORDER — CEFAZOLIN SODIUM-DEXTROSE 2-3 GM-%(50ML) IV SOLR
INTRAVENOUS | Status: DC | PRN
  Administered 2021-11-28: 2 g via INTRAVENOUS

## 2021-11-28 MED ORDER — ONDANSETRON HCL 4 MG/2ML IJ SOLN: 4.0000 mg | Freq: Once | INTRAMUSCULAR | Status: DC | PRN

## 2021-11-28 MED ORDER — SODIUM CHLORIDE FLUSH 0.9 % IV SOLN
INTRAVENOUS | Status: DC | PRN
  Administered 2021-11-28: 10 mL via INTRAVENOUS

## 2021-11-28 MED ORDER — APIXABAN 5 MG PO TABS: 5.0000 mg | ORAL_TABLET | Freq: Two times a day (BID) | ORAL | 3 refills | Status: DC

## 2021-11-28 MED ORDER — ACETAMINOPHEN 10 MG/ML IV SOLN: 1000.0000 mg | Freq: Once | INTRAVENOUS | Status: DC | PRN

## 2021-11-28 MED ORDER — FENTANYL CITRATE (PF) 100 MCG/2ML IJ SOLN
INTRAMUSCULAR | Status: DC | PRN
Start: 2021-11-28 — End: 2021-11-28
  Administered 2021-11-28: 50 ug via INTRAVENOUS

## 2021-11-28 MED ORDER — PROPOFOL 10 MG/ML IV BOLUS
INTRAVENOUS | Status: DC | PRN
Start: 2021-11-28 — End: 2021-11-28
  Administered 2021-11-28 (×3): 20 mg via INTRAVENOUS

## 2021-11-28 MED ORDER — ONDANSETRON HCL 4 MG/2ML IJ SOLN
INTRAMUSCULAR | Status: DC | PRN
  Administered 2021-11-28: 4 mg via INTRAVENOUS

## 2021-11-28 MED ORDER — FENTANYL CITRATE (PF) 100 MCG/2ML IJ SOLN
INTRAMUSCULAR | Status: AC
  Filled 2021-11-28: qty 2

## 2021-11-28 MED ORDER — LIDOCAINE HCL (CARDIAC) PF 100 MG/5ML IV SOSY
PREFILLED_SYRINGE | INTRAVENOUS | Status: DC | PRN
  Administered 2021-11-28: 40 mg via INTRAVENOUS

## 2021-11-28 MED ORDER — FENTANYL CITRATE (PF) 100 MCG/2ML IJ SOLN: 25.0000 ug | INTRAMUSCULAR | Status: DC | PRN

## 2021-11-28 SURGICAL SUPPLY — 19 items
CNTNR URN SCR LID CUP LEK RST (MISCELLANEOUS) ×1 IMPLANT
CONT SPEC 4OZ STRL OR WHT (MISCELLANEOUS) ×2
COVER BACK TABLE 60X90IN (DRAPES) ×2 IMPLANT
DRSG IV TEGADERM 3.5X4.5 STRL (GAUZE/BANDAGES/DRESSINGS) ×1 IMPLANT
DRSG TEGADERM 4X4.75 (GAUZE/BANDAGES/DRESSINGS) ×2 IMPLANT
DRSG TEGADERM 8X12 (GAUZE/BANDAGES/DRESSINGS) ×2 IMPLANT
GAUZE SPONGE 4X4 12PLY STRL (GAUZE/BANDAGES/DRESSINGS) ×1 IMPLANT
GAUZE SPONGE 4X4 12PLY STRL LF (GAUZE/BANDAGES/DRESSINGS) ×2 IMPLANT
GLOVE SURG ENC MOIS LTX SZ7.5 (GLOVE) ×2 IMPLANT
GLOVE SURG ENC MOIS LTX SZ8 (GLOVE) IMPLANT
GOWN STRL REUS W/TWL LRG LVL3 (GOWN DISPOSABLE) ×2 IMPLANT
IMPL SPACEOAR VUE SYSTEM (Spacer) ×1 IMPLANT
IMPLANT SPACEOAR VUE SYSTEM (Spacer) ×2 IMPLANT
KIT TURNOVER CYSTO (KITS) ×2 IMPLANT
MARKER GOLD PRELOAD 1.2X3 (Urological Implant) ×1 IMPLANT
SEED GOLD PRELOAD 1.2X3 (Urological Implant) ×2 IMPLANT
SURGILUBE 2OZ TUBE FLIPTOP (MISCELLANEOUS) ×2 IMPLANT
SYR CONTROL 10ML LL (SYRINGE) ×2 IMPLANT
UNDERPAD 30X36 HEAVY ABSORB (UNDERPADS AND DIAPERS) ×2 IMPLANT

## 2021-11-28 NOTE — Anesthesia Preprocedure Evaluation (Signed)
Anesthesia Evaluation  Patient identified by MRN, date of birth, ID band Patient awake    Reviewed: Allergy & Precautions, NPO status , Patient's Chart, lab work & pertinent test results  Airway Mallampati: II  TM Distance: >3 FB Neck ROM: Limited    Dental no notable dental hx.    Pulmonary COPD,  COPD inhaler, former smoker,    Pulmonary exam normal breath sounds clear to auscultation       Cardiovascular hypertension, Pt. on medications + CAD, + CABG and + Peripheral Vascular Disease  Normal cardiovascular exam+ dysrhythmias Atrial Fibrillation  Rhythm:Regular Rate:Normal     Neuro/Psych TIAnegative psych ROS   GI/Hepatic Neg liver ROS, GERD  Medicated,  Endo/Other  negative endocrine ROS  Renal/GU Renal InsufficiencyRenal disease  negative genitourinary   Musculoskeletal negative musculoskeletal ROS (+)   Abdominal   Peds negative pediatric ROS (+)  Hematology negative hematology ROS (+)   Anesthesia Other Findings   Reproductive/Obstetrics negative OB ROS                             Anesthesia Physical Anesthesia Plan  ASA: 3  Anesthesia Plan: MAC   Post-op Pain Management: Minimal or no pain anticipated   Induction: Intravenous  PONV Risk Score and Plan: 1 and Propofol infusion and Treatment may vary due to age or medical condition  Airway Management Planned: Simple Face Mask  Additional Equipment:   Intra-op Plan:   Post-operative Plan:   Informed Consent: I have reviewed the patients History and Physical, chart, labs and discussed the procedure including the risks, benefits and alternatives for the proposed anesthesia with the patient or authorized representative who has indicated his/her understanding and acceptance.     Dental advisory given  Plan Discussed with: CRNA and Surgeon  Anesthesia Plan Comments:         Anesthesia Quick Evaluation

## 2021-11-28 NOTE — Discharge Instructions (Signed)

## 2021-11-28 NOTE — Interval H&P Note (Signed)
History and Physical Interval Note:  11/28/2021 9:17 AM  Ryan [name]  has presented today for surgery, with the diagnosis of PROSTATE CANCER.  The various methods of treatment have been discussed with the patient and family. After consideration of risks, benefits and other options for treatment, the patient has consented to  Procedure(s): GOLD SEED IMPLANT (N/A) SPACE OAR INSTILLATION (N/A) as a surgical intervention.  The patient's history has been reviewed, patient examined, no change in status, stable for surgery.  I have reviewed the patient's chart and labs.  Questions were answered to the patient's satisfaction.  I drew patient a picture of the anatomy and went over the procedure in detail.  Also discussed risk of rectal and urethral injury among others.   Festus Aloe

## 2021-11-28 NOTE — Anesthesia Postprocedure Evaluation (Signed)
Anesthesia Post Note  Patient: Ryan [name]  Procedure(s) Performed: GOLD SEED IMPLANT (Prostate) SPACE OAR INSTILLATION (Prostate)     Patient location during evaluation: PACU Anesthesia Type: MAC Level of consciousness: awake and alert Pain management: pain level controlled Vital Signs Assessment: post-procedure vital signs reviewed and stable Respiratory status: spontaneous breathing, nonlabored ventilation, respiratory function stable and patient connected to nasal cannula oxygen Cardiovascular status: stable and blood pressure returned to baseline Postop Assessment: no apparent nausea or vomiting Anesthetic complications: no   No notable events documented.  Last Vitals:  Vitals:   11/28/21 0852  BP: 134/80  Pulse: 92  Resp: 18  Temp: (!) 36.1 C  SpO2: 100%    Last Pain:  Vitals:   11/28/21 0852  TempSrc: Oral  PainSc: 0-No pain                 Siddhanth Denk S

## 2021-11-28 NOTE — Transfer of Care (Signed)
Immediate Anesthesia Transfer of Care Note  Patient: Ryan [name]  Procedure(s) Performed: GOLD SEED IMPLANT (Prostate) SPACE OAR INSTILLATION (Prostate)  Patient Location: PACU  Anesthesia Type:MAC  Level of Consciousness: awake, alert  and oriented  Airway & Oxygen Therapy: Patient Spontanous Breathing  Post-op Assessment: Report given to RN and Post -op Vital signs reviewed and stable  Post vital signs: Reviewed and stable  Last Vitals:  Vitals Value Taken Time  BP    Temp    Pulse    Resp    SpO2      Last Pain:  Vitals:   11/28/21 0852  TempSrc: Oral  PainSc: 0-No pain      Patients Stated Pain Goal: 6 (55/25/89 4834)  Complications: No notable events documented.

## 2021-11-28 NOTE — Op Note (Signed)
Preoperative diagnosis: Prostate cancer Postoperative diagnosis: Prostate cancer  Procedure: Transrectal ultrasound-guided transperineal placement of gold seed prostate fiducial markers and SpaceOAR biodegradable gel  Surgeon: Junious Silk  Anesthesia: General  Indication for procedure: Ryan [name] is a 76 yo male with prostate cancer who is preparing to start external beam radiation.  He presents today for the above.  Findings: Normal-appearing prostate with intact capsule and no hypoechoic. Seminal vesicles appeared normal.   Description of procedure: After consent was obtained patient brought to the operating room.  After adequate anesthesia he was placed in lithotomy position and the scrotum supported superiorly.  The perineum was prepped.  Ultrasound probe inserted rectally.  Prostate imaged in the axial and sagittal views.  The gold seed markers were passed transperineally under ultrasound guidance placing three total with one in the right base, right apex and left mid.  The 18-gauge needle was then inserted approximately 1 to 2 cm anterior to the anal opening and directed under ultrasonic guidance into the perirectal fat between the anterior rectal wall and the prostate capsule down to the mid-gland. Midline needle position was confirmed in the sagittal and axial views to verify the tip was in the perirectal fat.  Small amounts of saline were injected to hydrodissect the space between the prostate and the anterior rectal wall.  Axial imaging was viewed to confirm the needle was in the correct location in the mid gland and centered.  Aspiration confirmed no intravascular access.  The saline syringe was carefully disconnected maintaining the desired needle position and the hydrogel was attached to the needle.  Under ultrasound guidance in the sagittal view a smooth continuous injection was done over about 12 seconds delivering the hydrogel into the space between the prostate and rectal wall.  The needle  was withdrawn.  A dressing was placed and the patient was awakened and taken to the cover room in stable condition.  Complications: None  Blood loss: Minimal  Specimens: None  Drains: None  Disposition: Patient stable to PACU

## 2021-12-01 ENCOUNTER — Ambulatory Visit
Admission: RE | Admit: 2021-12-01 | Discharge: 2021-12-01 | Disposition: A | Discharge status: Home / Self Care | Payer: Medicare Other | Source: Ambulatory Visit | Attending: Radiation Oncology | Admitting: Radiation Oncology

## 2021-12-01 ENCOUNTER — Other Ambulatory Visit: Payer: Self-pay

## 2021-12-01 ENCOUNTER — Encounter (HOSPITAL_BASED_OUTPATIENT_CLINIC_OR_DEPARTMENT_OTHER): Payer: Self-pay | Admitting: Urology

## 2021-12-01 DIAGNOSIS — C61 Malignant neoplasm of prostate: Secondary | ICD-10-CM | POA: Diagnosis not present

## 2021-12-01 DIAGNOSIS — Z510 Encounter for antineoplastic radiation therapy: Secondary | ICD-10-CM | POA: Diagnosis not present

## 2021-12-01 DIAGNOSIS — Z191 Hormone sensitive malignancy status: Secondary | ICD-10-CM | POA: Diagnosis not present

## 2021-12-01 NOTE — Progress Notes (Signed)
°  Radiation Oncology         [phone] ________________________________  Name: Ryan [name] MRN: [id number]  Date: 12/01/2021  DOB: 18-May-1946  SIMULATION AND TREATMENT PLANNING NOTE    ICD-10-CM   1. Malignant neoplasm of prostate ([place])  C61       DIAGNOSIS:  76 y.o. 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  Identity was confirmed.  All relevant records and images related to the planned course of therapy were reviewed.  The patient freely provided informed written consent to proceed with treatment after reviewing the details related to the planned course of therapy. The consent form was witnessed and verified by the simulation staff.  Then, the patient was set-up in a stable reproducible supine position for radiation therapy.  A vacuum lock pillow device was custom fabricated to position his legs in a reproducible immobilized position.  Then, I performed a urethrogram under sterile conditions to identify the prostatic bed.  CT images were obtained.  Surface markings were placed.  The CT images were loaded into the planning software.  Then the prostate bed target, pelvic lymph node target and avoidance structures including the rectum, bladder, bowel and hips were contoured.  Treatment planning then occurred.  The radiation prescription was entered and confirmed.  A total of one complex treatment devices were fabricated. I have requested : Intensity Modulated Radiotherapy (IMRT) is medically necessary for this case for the following reason:  Rectal sparing.Marland Kitchen  PLAN:  The patient will receive 45 Gy in 25 fractions of 1.8 Gy, followed by a boost to the prostate to a total dose of 75 Gy with 15 additional fractions of 2 Gy.   ________________________________  Sheral Apley Tammi Klippel, M.D.

## 2021-12-02 DIAGNOSIS — Z510 Encounter for antineoplastic radiation therapy: Secondary | ICD-10-CM | POA: Diagnosis not present

## 2021-12-02 DIAGNOSIS — Z191 Hormone sensitive malignancy status: Secondary | ICD-10-CM | POA: Diagnosis not present

## 2021-12-02 DIAGNOSIS — C61 Malignant neoplasm of prostate: Secondary | ICD-10-CM | POA: Diagnosis not present

## 2021-12-03 DIAGNOSIS — C61 Malignant neoplasm of prostate: Secondary | ICD-10-CM | POA: Diagnosis not present

## 2021-12-03 DIAGNOSIS — Z191 Hormone sensitive malignancy status: Secondary | ICD-10-CM | POA: Diagnosis not present

## 2021-12-04 DIAGNOSIS — H353231 Exudative age-related macular degeneration, bilateral, with active choroidal neovascularization: Secondary | ICD-10-CM | POA: Diagnosis not present

## 2021-12-04 DIAGNOSIS — M25511 Pain in right shoulder: Secondary | ICD-10-CM | POA: Diagnosis not present

## 2021-12-04 DIAGNOSIS — H26493 Other secondary cataract, bilateral: Secondary | ICD-10-CM | POA: Diagnosis not present

## 2021-12-05 DIAGNOSIS — C61 Malignant neoplasm of prostate: Secondary | ICD-10-CM | POA: Diagnosis not present

## 2021-12-10 ENCOUNTER — Other Ambulatory Visit: Payer: Self-pay

## 2021-12-10 ENCOUNTER — Ambulatory Visit
Admission: RE | Admit: 2021-12-10 | Discharge: 2021-12-10 | Disposition: A | Discharge status: Home / Self Care | Payer: Medicare Other | Source: Ambulatory Visit | Attending: Radiation Oncology | Admitting: Radiation Oncology

## 2021-12-10 DIAGNOSIS — Z510 Encounter for antineoplastic radiation therapy: Secondary | ICD-10-CM | POA: Diagnosis not present

## 2021-12-10 DIAGNOSIS — C61 Malignant neoplasm of prostate: Secondary | ICD-10-CM | POA: Insufficient documentation

## 2021-12-10 DIAGNOSIS — Z191 Hormone sensitive malignancy status: Secondary | ICD-10-CM | POA: Diagnosis not present

## 2021-12-11 ENCOUNTER — Ambulatory Visit
Admission: RE | Admit: 2021-12-11 | Discharge: 2021-12-11 | Disposition: A | Discharge status: Home / Self Care | Payer: Medicare Other | Source: Ambulatory Visit | Attending: Radiation Oncology | Admitting: Radiation Oncology

## 2021-12-11 DIAGNOSIS — C61 Malignant neoplasm of prostate: Secondary | ICD-10-CM | POA: Diagnosis not present

## 2021-12-11 DIAGNOSIS — Z510 Encounter for antineoplastic radiation therapy: Secondary | ICD-10-CM | POA: Diagnosis not present

## 2021-12-11 DIAGNOSIS — Z191 Hormone sensitive malignancy status: Secondary | ICD-10-CM | POA: Diagnosis not present

## 2021-12-12 ENCOUNTER — Other Ambulatory Visit: Payer: Self-pay

## 2021-12-12 ENCOUNTER — Ambulatory Visit
Admission: RE | Admit: 2021-12-12 | Discharge: 2021-12-12 | Disposition: A | Discharge status: Home / Self Care | Payer: Medicare Other | Source: Ambulatory Visit | Attending: Radiation Oncology | Admitting: Radiation Oncology

## 2021-12-12 DIAGNOSIS — Z510 Encounter for antineoplastic radiation therapy: Secondary | ICD-10-CM | POA: Diagnosis not present

## 2021-12-12 DIAGNOSIS — C61 Malignant neoplasm of prostate: Secondary | ICD-10-CM | POA: Diagnosis not present

## 2021-12-12 DIAGNOSIS — Z191 Hormone sensitive malignancy status: Secondary | ICD-10-CM | POA: Diagnosis not present

## 2021-12-15 ENCOUNTER — Ambulatory Visit
Admission: RE | Admit: 2021-12-15 | Discharge: 2021-12-15 | Disposition: A | Discharge status: Home / Self Care | Payer: Medicare Other | Source: Ambulatory Visit | Attending: Radiation Oncology | Admitting: Radiation Oncology

## 2021-12-15 ENCOUNTER — Other Ambulatory Visit: Payer: Self-pay

## 2021-12-15 DIAGNOSIS — Z510 Encounter for antineoplastic radiation therapy: Secondary | ICD-10-CM | POA: Diagnosis not present

## 2021-12-15 DIAGNOSIS — C61 Malignant neoplasm of prostate: Secondary | ICD-10-CM | POA: Diagnosis not present

## 2021-12-15 DIAGNOSIS — Z191 Hormone sensitive malignancy status: Secondary | ICD-10-CM | POA: Diagnosis not present

## 2021-12-16 ENCOUNTER — Ambulatory Visit
Admission: RE | Admit: 2021-12-16 | Discharge: 2021-12-16 | Disposition: A | Discharge status: Home / Self Care | Payer: Medicare Other | Source: Ambulatory Visit | Attending: Radiation Oncology | Admitting: Radiation Oncology

## 2021-12-16 DIAGNOSIS — Z191 Hormone sensitive malignancy status: Secondary | ICD-10-CM | POA: Diagnosis not present

## 2021-12-16 DIAGNOSIS — C61 Malignant neoplasm of prostate: Secondary | ICD-10-CM | POA: Diagnosis not present

## 2021-12-16 DIAGNOSIS — Z510 Encounter for antineoplastic radiation therapy: Secondary | ICD-10-CM | POA: Diagnosis not present

## 2021-12-17 ENCOUNTER — Ambulatory Visit
Admission: RE | Admit: 2021-12-17 | Discharge: 2021-12-17 | Disposition: A | Discharge status: Home / Self Care | Payer: Medicare Other | Source: Ambulatory Visit | Attending: Radiation Oncology | Admitting: Radiation Oncology

## 2021-12-17 ENCOUNTER — Other Ambulatory Visit: Payer: Self-pay

## 2021-12-17 DIAGNOSIS — Z510 Encounter for antineoplastic radiation therapy: Secondary | ICD-10-CM | POA: Diagnosis not present

## 2021-12-17 DIAGNOSIS — C61 Malignant neoplasm of prostate: Secondary | ICD-10-CM | POA: Diagnosis not present

## 2021-12-17 DIAGNOSIS — Z191 Hormone sensitive malignancy status: Secondary | ICD-10-CM | POA: Diagnosis not present

## 2021-12-18 ENCOUNTER — Ambulatory Visit
Admission: RE | Admit: 2021-12-18 | Discharge: 2021-12-18 | Disposition: A | Discharge status: Home / Self Care | Payer: Medicare Other | Source: Ambulatory Visit | Attending: Radiation Oncology | Admitting: Radiation Oncology

## 2021-12-18 DIAGNOSIS — C61 Malignant neoplasm of prostate: Secondary | ICD-10-CM | POA: Diagnosis not present

## 2021-12-18 DIAGNOSIS — Z510 Encounter for antineoplastic radiation therapy: Secondary | ICD-10-CM | POA: Diagnosis not present

## 2021-12-18 DIAGNOSIS — Z191 Hormone sensitive malignancy status: Secondary | ICD-10-CM | POA: Diagnosis not present

## 2021-12-19 ENCOUNTER — Other Ambulatory Visit: Payer: Self-pay

## 2021-12-19 ENCOUNTER — Ambulatory Visit
Admission: RE | Admit: 2021-12-19 | Discharge: 2021-12-19 | Disposition: A | Discharge status: Home / Self Care | Payer: Medicare Other | Source: Ambulatory Visit | Attending: Radiation Oncology | Admitting: Radiation Oncology

## 2021-12-19 DIAGNOSIS — C61 Malignant neoplasm of prostate: Secondary | ICD-10-CM | POA: Diagnosis not present

## 2021-12-19 DIAGNOSIS — Z191 Hormone sensitive malignancy status: Secondary | ICD-10-CM | POA: Diagnosis not present

## 2021-12-19 DIAGNOSIS — Z510 Encounter for antineoplastic radiation therapy: Secondary | ICD-10-CM | POA: Diagnosis not present

## 2021-12-20 ENCOUNTER — Other Ambulatory Visit: Payer: Self-pay | Admitting: Family Medicine

## 2021-12-22 ENCOUNTER — Ambulatory Visit: Payer: Medicare Other

## 2021-12-23 ENCOUNTER — Other Ambulatory Visit: Payer: Self-pay

## 2021-12-23 ENCOUNTER — Ambulatory Visit
Admission: RE | Admit: 2021-12-23 | Discharge: 2021-12-23 | Disposition: A | Discharge status: Home / Self Care | Payer: Medicare Other | Source: Ambulatory Visit | Attending: Radiation Oncology | Admitting: Radiation Oncology

## 2021-12-23 DIAGNOSIS — Z191 Hormone sensitive malignancy status: Secondary | ICD-10-CM | POA: Diagnosis not present

## 2021-12-23 DIAGNOSIS — C61 Malignant neoplasm of prostate: Secondary | ICD-10-CM | POA: Diagnosis not present

## 2021-12-23 DIAGNOSIS — Z510 Encounter for antineoplastic radiation therapy: Secondary | ICD-10-CM | POA: Diagnosis not present

## 2021-12-24 ENCOUNTER — Ambulatory Visit
Admission: RE | Admit: 2021-12-24 | Discharge: 2021-12-24 | Disposition: A | Discharge status: Home / Self Care | Payer: Medicare Other | Source: Ambulatory Visit | Attending: Radiation Oncology | Admitting: Radiation Oncology

## 2021-12-24 DIAGNOSIS — R3916 Straining to void: Secondary | ICD-10-CM | POA: Diagnosis not present

## 2021-12-24 DIAGNOSIS — Z510 Encounter for antineoplastic radiation therapy: Secondary | ICD-10-CM | POA: Diagnosis not present

## 2021-12-24 DIAGNOSIS — R351 Nocturia: Secondary | ICD-10-CM | POA: Diagnosis not present

## 2021-12-24 DIAGNOSIS — N401 Enlarged prostate with lower urinary tract symptoms: Secondary | ICD-10-CM | POA: Diagnosis not present

## 2021-12-24 DIAGNOSIS — Z191 Hormone sensitive malignancy status: Secondary | ICD-10-CM | POA: Diagnosis not present

## 2021-12-24 DIAGNOSIS — C61 Malignant neoplasm of prostate: Secondary | ICD-10-CM | POA: Diagnosis not present

## 2021-12-25 ENCOUNTER — Ambulatory Visit
Admission: RE | Admit: 2021-12-25 | Discharge: 2021-12-25 | Disposition: A | Discharge status: Home / Self Care | Payer: Medicare Other | Source: Ambulatory Visit | Attending: Radiation Oncology | Admitting: Radiation Oncology

## 2021-12-25 ENCOUNTER — Other Ambulatory Visit: Payer: Self-pay

## 2021-12-25 DIAGNOSIS — C61 Malignant neoplasm of prostate: Secondary | ICD-10-CM | POA: Diagnosis not present

## 2021-12-25 DIAGNOSIS — Z510 Encounter for antineoplastic radiation therapy: Secondary | ICD-10-CM | POA: Diagnosis not present

## 2021-12-25 DIAGNOSIS — Z191 Hormone sensitive malignancy status: Secondary | ICD-10-CM | POA: Diagnosis not present

## 2021-12-26 ENCOUNTER — Other Ambulatory Visit: Payer: Self-pay

## 2021-12-26 ENCOUNTER — Ambulatory Visit
Admission: RE | Admit: 2021-12-26 | Discharge: 2021-12-26 | Disposition: A | Discharge status: Home / Self Care | Payer: Medicare Other | Source: Ambulatory Visit | Attending: Radiation Oncology | Admitting: Radiation Oncology

## 2021-12-26 DIAGNOSIS — C61 Malignant neoplasm of prostate: Secondary | ICD-10-CM | POA: Diagnosis not present

## 2021-12-26 DIAGNOSIS — Z191 Hormone sensitive malignancy status: Secondary | ICD-10-CM | POA: Diagnosis not present

## 2021-12-26 DIAGNOSIS — Z510 Encounter for antineoplastic radiation therapy: Secondary | ICD-10-CM | POA: Diagnosis not present

## 2021-12-28 ENCOUNTER — Other Ambulatory Visit: Payer: Self-pay | Admitting: Gastroenterology

## 2021-12-28 ENCOUNTER — Other Ambulatory Visit: Payer: Self-pay | Admitting: Family Medicine

## 2021-12-28 DIAGNOSIS — R197 Diarrhea, unspecified: Secondary | ICD-10-CM

## 2021-12-29 ENCOUNTER — Ambulatory Visit
Admission: RE | Admit: 2021-12-29 | Discharge: 2021-12-29 | Disposition: A | Discharge status: Home / Self Care | Payer: Medicare Other | Source: Ambulatory Visit | Attending: Radiation Oncology | Admitting: Radiation Oncology

## 2021-12-29 DIAGNOSIS — C61 Malignant neoplasm of prostate: Secondary | ICD-10-CM | POA: Diagnosis not present

## 2021-12-29 DIAGNOSIS — Z510 Encounter for antineoplastic radiation therapy: Secondary | ICD-10-CM | POA: Diagnosis not present

## 2021-12-29 DIAGNOSIS — Z191 Hormone sensitive malignancy status: Secondary | ICD-10-CM | POA: Diagnosis not present

## 2021-12-30 ENCOUNTER — Ambulatory Visit
Admission: RE | Admit: 2021-12-30 | Discharge: 2021-12-30 | Disposition: A | Discharge status: Home / Self Care | Payer: Medicare Other | Source: Ambulatory Visit | Attending: Radiation Oncology | Admitting: Radiation Oncology

## 2021-12-30 ENCOUNTER — Other Ambulatory Visit: Payer: Self-pay

## 2021-12-30 DIAGNOSIS — Z191 Hormone sensitive malignancy status: Secondary | ICD-10-CM | POA: Diagnosis not present

## 2021-12-30 DIAGNOSIS — Z510 Encounter for antineoplastic radiation therapy: Secondary | ICD-10-CM | POA: Diagnosis not present

## 2021-12-30 DIAGNOSIS — C61 Malignant neoplasm of prostate: Secondary | ICD-10-CM | POA: Diagnosis not present

## 2021-12-31 ENCOUNTER — Ambulatory Visit
Admission: RE | Admit: 2021-12-31 | Discharge: 2021-12-31 | Disposition: A | Discharge status: Home / Self Care | Payer: Medicare Other | Source: Ambulatory Visit | Attending: Radiation Oncology | Admitting: Radiation Oncology

## 2021-12-31 DIAGNOSIS — C61 Malignant neoplasm of prostate: Secondary | ICD-10-CM | POA: Diagnosis not present

## 2021-12-31 DIAGNOSIS — Z510 Encounter for antineoplastic radiation therapy: Secondary | ICD-10-CM | POA: Diagnosis not present

## 2021-12-31 DIAGNOSIS — Z191 Hormone sensitive malignancy status: Secondary | ICD-10-CM | POA: Diagnosis not present

## 2022-01-01 ENCOUNTER — Ambulatory Visit
Admission: RE | Admit: 2022-01-01 | Discharge: 2022-01-01 | Disposition: A | Discharge status: Home / Self Care | Payer: Medicare Other | Source: Ambulatory Visit | Attending: Radiation Oncology | Admitting: Radiation Oncology

## 2022-01-01 ENCOUNTER — Other Ambulatory Visit: Payer: Self-pay

## 2022-01-01 DIAGNOSIS — Z510 Encounter for antineoplastic radiation therapy: Secondary | ICD-10-CM | POA: Diagnosis not present

## 2022-01-01 DIAGNOSIS — Z191 Hormone sensitive malignancy status: Secondary | ICD-10-CM | POA: Diagnosis not present

## 2022-01-01 DIAGNOSIS — C61 Malignant neoplasm of prostate: Secondary | ICD-10-CM | POA: Diagnosis not present

## 2022-01-02 ENCOUNTER — Ambulatory Visit
Admission: RE | Admit: 2022-01-02 | Discharge: 2022-01-02 | Disposition: A | Discharge status: Home / Self Care | Payer: Medicare Other | Source: Ambulatory Visit | Attending: Radiation Oncology | Admitting: Radiation Oncology

## 2022-01-02 DIAGNOSIS — C61 Malignant neoplasm of prostate: Secondary | ICD-10-CM | POA: Diagnosis not present

## 2022-01-02 DIAGNOSIS — Z191 Hormone sensitive malignancy status: Secondary | ICD-10-CM | POA: Diagnosis not present

## 2022-01-02 DIAGNOSIS — Z510 Encounter for antineoplastic radiation therapy: Secondary | ICD-10-CM | POA: Diagnosis not present

## 2022-01-04 ENCOUNTER — Other Ambulatory Visit: Payer: Self-pay | Admitting: Gastroenterology

## 2022-01-05 ENCOUNTER — Ambulatory Visit
Admission: RE | Admit: 2022-01-05 | Discharge: 2022-01-05 | Disposition: A | Discharge status: Home / Self Care | Payer: Medicare Other | Source: Ambulatory Visit | Attending: Radiation Oncology | Admitting: Radiation Oncology

## 2022-01-05 ENCOUNTER — Other Ambulatory Visit: Payer: Self-pay

## 2022-01-05 DIAGNOSIS — D6869 Other thrombophilia: Secondary | ICD-10-CM | POA: Diagnosis not present

## 2022-01-05 DIAGNOSIS — I4819 Other persistent atrial fibrillation: Secondary | ICD-10-CM | POA: Insufficient documentation

## 2022-01-05 DIAGNOSIS — Z510 Encounter for antineoplastic radiation therapy: Secondary | ICD-10-CM | POA: Diagnosis not present

## 2022-01-05 DIAGNOSIS — Z191 Hormone sensitive malignancy status: Secondary | ICD-10-CM | POA: Diagnosis not present

## 2022-01-05 DIAGNOSIS — C61 Malignant neoplasm of prostate: Secondary | ICD-10-CM | POA: Diagnosis not present

## 2022-01-06 ENCOUNTER — Ambulatory Visit
Admission: RE | Admit: 2022-01-06 | Discharge: 2022-01-06 | Disposition: A | Discharge status: Home / Self Care | Payer: Medicare Other | Source: Ambulatory Visit | Attending: Radiation Oncology | Admitting: Radiation Oncology

## 2022-01-06 DIAGNOSIS — I4819 Other persistent atrial fibrillation: Secondary | ICD-10-CM | POA: Diagnosis not present

## 2022-01-06 DIAGNOSIS — Z191 Hormone sensitive malignancy status: Secondary | ICD-10-CM | POA: Diagnosis not present

## 2022-01-06 DIAGNOSIS — C61 Malignant neoplasm of prostate: Secondary | ICD-10-CM | POA: Diagnosis not present

## 2022-01-06 DIAGNOSIS — D6869 Other thrombophilia: Secondary | ICD-10-CM | POA: Diagnosis not present

## 2022-01-06 DIAGNOSIS — Z510 Encounter for antineoplastic radiation therapy: Secondary | ICD-10-CM | POA: Diagnosis not present

## 2022-01-07 ENCOUNTER — Other Ambulatory Visit: Payer: Self-pay

## 2022-01-07 ENCOUNTER — Ambulatory Visit
Admission: RE | Admit: 2022-01-07 | Discharge: 2022-01-07 | Disposition: A | Discharge status: Home / Self Care | Payer: Medicare Other | Source: Ambulatory Visit | Attending: Radiation Oncology | Admitting: Radiation Oncology

## 2022-01-07 DIAGNOSIS — Z191 Hormone sensitive malignancy status: Secondary | ICD-10-CM | POA: Diagnosis not present

## 2022-01-07 DIAGNOSIS — I4819 Other persistent atrial fibrillation: Secondary | ICD-10-CM | POA: Diagnosis not present

## 2022-01-07 DIAGNOSIS — C61 Malignant neoplasm of prostate: Secondary | ICD-10-CM | POA: Diagnosis not present

## 2022-01-07 DIAGNOSIS — Z510 Encounter for antineoplastic radiation therapy: Secondary | ICD-10-CM | POA: Diagnosis not present

## 2022-01-07 DIAGNOSIS — D6869 Other thrombophilia: Secondary | ICD-10-CM | POA: Diagnosis not present

## 2022-01-08 ENCOUNTER — Other Ambulatory Visit: Payer: Self-pay | Admitting: Radiation Oncology

## 2022-01-08 ENCOUNTER — Ambulatory Visit
Admission: RE | Admit: 2022-01-08 | Discharge: 2022-01-08 | Disposition: A | Discharge status: Home / Self Care | Payer: Medicare Other | Source: Ambulatory Visit | Attending: Radiation Oncology | Admitting: Radiation Oncology

## 2022-01-08 DIAGNOSIS — Z951 Presence of aortocoronary bypass graft: Secondary | ICD-10-CM | POA: Diagnosis not present

## 2022-01-08 DIAGNOSIS — E860 Dehydration: Secondary | ICD-10-CM | POA: Diagnosis not present

## 2022-01-08 DIAGNOSIS — I480 Paroxysmal atrial fibrillation: Secondary | ICD-10-CM | POA: Diagnosis present

## 2022-01-08 DIAGNOSIS — Z981 Arthrodesis status: Secondary | ICD-10-CM | POA: Diagnosis not present

## 2022-01-08 DIAGNOSIS — I5032 Chronic diastolic (congestive) heart failure: Secondary | ICD-10-CM | POA: Diagnosis present

## 2022-01-08 DIAGNOSIS — J449 Chronic obstructive pulmonary disease, unspecified: Secondary | ICD-10-CM | POA: Diagnosis not present

## 2022-01-08 DIAGNOSIS — H26499 Other secondary cataract, unspecified eye: Secondary | ICD-10-CM | POA: Diagnosis not present

## 2022-01-08 DIAGNOSIS — K219 Gastro-esophageal reflux disease without esophagitis: Secondary | ICD-10-CM | POA: Diagnosis present

## 2022-01-08 DIAGNOSIS — H3530 Unspecified macular degeneration: Secondary | ICD-10-CM | POA: Diagnosis present

## 2022-01-08 DIAGNOSIS — I700 Atherosclerosis of aorta: Secondary | ICD-10-CM | POA: Diagnosis not present

## 2022-01-08 DIAGNOSIS — K52839 Microscopic colitis, unspecified: Secondary | ICD-10-CM | POA: Diagnosis present

## 2022-01-08 DIAGNOSIS — I5022 Chronic systolic (congestive) heart failure: Secondary | ICD-10-CM | POA: Diagnosis not present

## 2022-01-08 DIAGNOSIS — R079 Chest pain, unspecified: Secondary | ICD-10-CM | POA: Diagnosis not present

## 2022-01-08 DIAGNOSIS — I10 Essential (primary) hypertension: Secondary | ICD-10-CM | POA: Diagnosis not present

## 2022-01-08 DIAGNOSIS — N200 Calculus of kidney: Secondary | ICD-10-CM | POA: Diagnosis not present

## 2022-01-08 DIAGNOSIS — Z8673 Personal history of transient ischemic attack (TIA), and cerebral infarction without residual deficits: Secondary | ICD-10-CM | POA: Diagnosis not present

## 2022-01-08 DIAGNOSIS — R1084 Generalized abdominal pain: Secondary | ICD-10-CM | POA: Diagnosis not present

## 2022-01-08 DIAGNOSIS — R112 Nausea with vomiting, unspecified: Secondary | ICD-10-CM | POA: Diagnosis not present

## 2022-01-08 DIAGNOSIS — H409 Unspecified glaucoma: Secondary | ICD-10-CM | POA: Diagnosis present

## 2022-01-08 DIAGNOSIS — N183 Chronic kidney disease, stage 3 unspecified: Secondary | ICD-10-CM | POA: Diagnosis present

## 2022-01-08 DIAGNOSIS — Z20822 Contact with and (suspected) exposure to covid-19: Secondary | ICD-10-CM | POA: Diagnosis present

## 2022-01-08 DIAGNOSIS — N3289 Other specified disorders of bladder: Secondary | ICD-10-CM | POA: Diagnosis not present

## 2022-01-08 DIAGNOSIS — E876 Hypokalemia: Secondary | ICD-10-CM | POA: Diagnosis not present

## 2022-01-08 DIAGNOSIS — K760 Fatty (change of) liver, not elsewhere classified: Secondary | ICD-10-CM | POA: Diagnosis not present

## 2022-01-08 DIAGNOSIS — E559 Vitamin D deficiency, unspecified: Secondary | ICD-10-CM | POA: Diagnosis present

## 2022-01-08 DIAGNOSIS — R197 Diarrhea, unspecified: Secondary | ICD-10-CM | POA: Diagnosis not present

## 2022-01-08 DIAGNOSIS — C61 Malignant neoplasm of prostate: Secondary | ICD-10-CM | POA: Diagnosis not present

## 2022-01-08 DIAGNOSIS — N138 Other obstructive and reflux uropathy: Secondary | ICD-10-CM | POA: Diagnosis present

## 2022-01-08 DIAGNOSIS — R6510 Systemic inflammatory response syndrome (SIRS) of non-infectious origin without acute organ dysfunction: Secondary | ICD-10-CM | POA: Diagnosis not present

## 2022-01-08 DIAGNOSIS — R0789 Other chest pain: Secondary | ICD-10-CM | POA: Diagnosis present

## 2022-01-08 DIAGNOSIS — I4510 Unspecified right bundle-branch block: Secondary | ICD-10-CM | POA: Diagnosis present

## 2022-01-08 DIAGNOSIS — H353231 Exudative age-related macular degeneration, bilateral, with active choroidal neovascularization: Secondary | ICD-10-CM | POA: Diagnosis not present

## 2022-01-08 DIAGNOSIS — Z7901 Long term (current) use of anticoagulants: Secondary | ICD-10-CM | POA: Diagnosis not present

## 2022-01-08 DIAGNOSIS — I130 Hypertensive heart and chronic kidney disease with heart failure and stage 1 through stage 4 chronic kidney disease, or unspecified chronic kidney disease: Secondary | ICD-10-CM | POA: Diagnosis present

## 2022-01-08 DIAGNOSIS — I2510 Atherosclerotic heart disease of native coronary artery without angina pectoris: Secondary | ICD-10-CM | POA: Diagnosis present

## 2022-01-08 DIAGNOSIS — K52832 Lymphocytic colitis: Secondary | ICD-10-CM | POA: Diagnosis not present

## 2022-01-08 DIAGNOSIS — Z510 Encounter for antineoplastic radiation therapy: Secondary | ICD-10-CM | POA: Diagnosis not present

## 2022-01-08 DIAGNOSIS — R630 Anorexia: Secondary | ICD-10-CM | POA: Diagnosis not present

## 2022-01-08 DIAGNOSIS — I4891 Unspecified atrial fibrillation: Secondary | ICD-10-CM | POA: Diagnosis not present

## 2022-01-08 DIAGNOSIS — Z191 Hormone sensitive malignancy status: Secondary | ICD-10-CM | POA: Diagnosis not present

## 2022-01-08 DIAGNOSIS — N401 Enlarged prostate with lower urinary tract symptoms: Secondary | ICD-10-CM | POA: Diagnosis present

## 2022-01-08 MED ORDER — LOPERAMIDE HCL 2 MG PO CAPS: ORAL_CAPSULE | ORAL | 3 refills | Status: DC

## 2022-01-09 ENCOUNTER — Other Ambulatory Visit: Payer: Self-pay

## 2022-01-09 ENCOUNTER — Ambulatory Visit
Admission: RE | Admit: 2022-01-09 | Discharge: 2022-01-09 | Disposition: A | Discharge status: Home / Self Care | Payer: Medicare Other | Source: Ambulatory Visit | Attending: Radiation Oncology | Admitting: Radiation Oncology

## 2022-01-09 DIAGNOSIS — Z510 Encounter for antineoplastic radiation therapy: Secondary | ICD-10-CM | POA: Diagnosis not present

## 2022-01-09 DIAGNOSIS — C61 Malignant neoplasm of prostate: Secondary | ICD-10-CM | POA: Diagnosis not present

## 2022-01-09 DIAGNOSIS — Z191 Hormone sensitive malignancy status: Secondary | ICD-10-CM | POA: Diagnosis not present

## 2022-01-11 ENCOUNTER — Inpatient Hospital Stay (HOSPITAL_COMMUNITY)
Admission: EM | Admit: 2022-01-11 | Discharge: 2022-01-15 | DRG: 392 | Disposition: A | Discharge status: Home / Self Care | Payer: Medicare Other | Attending: Family Medicine | Admitting: Family Medicine

## 2022-01-11 ENCOUNTER — Emergency Department (HOSPITAL_COMMUNITY): Payer: Medicare Other

## 2022-01-11 ENCOUNTER — Encounter (HOSPITAL_COMMUNITY): Payer: Self-pay

## 2022-01-11 DIAGNOSIS — I5032 Chronic diastolic (congestive) heart failure: Secondary | ICD-10-CM | POA: Diagnosis present

## 2022-01-11 DIAGNOSIS — H409 Unspecified glaucoma: Secondary | ICD-10-CM | POA: Diagnosis present

## 2022-01-11 DIAGNOSIS — E876 Hypokalemia: Secondary | ICD-10-CM | POA: Diagnosis present

## 2022-01-11 DIAGNOSIS — E559 Vitamin D deficiency, unspecified: Secondary | ICD-10-CM | POA: Diagnosis present

## 2022-01-11 DIAGNOSIS — Z9102 Food additives allergy status: Secondary | ICD-10-CM

## 2022-01-11 DIAGNOSIS — Z923 Personal history of irradiation: Secondary | ICD-10-CM

## 2022-01-11 DIAGNOSIS — R630 Anorexia: Secondary | ICD-10-CM | POA: Diagnosis not present

## 2022-01-11 DIAGNOSIS — R197 Diarrhea, unspecified: Secondary | ICD-10-CM | POA: Diagnosis present

## 2022-01-11 DIAGNOSIS — I5022 Chronic systolic (congestive) heart failure: Secondary | ICD-10-CM

## 2022-01-11 DIAGNOSIS — E860 Dehydration: Secondary | ICD-10-CM | POA: Diagnosis present

## 2022-01-11 DIAGNOSIS — R112 Nausea with vomiting, unspecified: Secondary | ICD-10-CM

## 2022-01-11 DIAGNOSIS — R0789 Other chest pain: Secondary | ICD-10-CM | POA: Diagnosis present

## 2022-01-11 DIAGNOSIS — I10 Essential (primary) hypertension: Secondary | ICD-10-CM | POA: Diagnosis present

## 2022-01-11 DIAGNOSIS — R1084 Generalized abdominal pain: Secondary | ICD-10-CM | POA: Diagnosis not present

## 2022-01-11 DIAGNOSIS — R6510 Systemic inflammatory response syndrome (SIRS) of non-infectious origin without acute organ dysfunction: Secondary | ICD-10-CM | POA: Diagnosis not present

## 2022-01-11 DIAGNOSIS — K529 Noninfective gastroenteritis and colitis, unspecified: Secondary | ICD-10-CM

## 2022-01-11 DIAGNOSIS — Z9049 Acquired absence of other specified parts of digestive tract: Secondary | ICD-10-CM

## 2022-01-11 DIAGNOSIS — Z951 Presence of aortocoronary bypass graft: Secondary | ICD-10-CM

## 2022-01-11 DIAGNOSIS — N3289 Other specified disorders of bladder: Secondary | ICD-10-CM | POA: Diagnosis not present

## 2022-01-11 DIAGNOSIS — C61 Malignant neoplasm of prostate: Secondary | ICD-10-CM

## 2022-01-11 DIAGNOSIS — K52839 Microscopic colitis, unspecified: Secondary | ICD-10-CM | POA: Diagnosis present

## 2022-01-11 DIAGNOSIS — N183 Chronic kidney disease, stage 3 unspecified: Secondary | ICD-10-CM | POA: Diagnosis present

## 2022-01-11 DIAGNOSIS — N138 Other obstructive and reflux uropathy: Secondary | ICD-10-CM | POA: Diagnosis present

## 2022-01-11 DIAGNOSIS — R079 Chest pain, unspecified: Secondary | ICD-10-CM | POA: Diagnosis not present

## 2022-01-11 DIAGNOSIS — K219 Gastro-esophageal reflux disease without esophagitis: Secondary | ICD-10-CM | POA: Diagnosis present

## 2022-01-11 DIAGNOSIS — I4510 Unspecified right bundle-branch block: Secondary | ICD-10-CM | POA: Diagnosis present

## 2022-01-11 DIAGNOSIS — N200 Calculus of kidney: Secondary | ICD-10-CM | POA: Diagnosis not present

## 2022-01-11 DIAGNOSIS — I2510 Atherosclerotic heart disease of native coronary artery without angina pectoris: Secondary | ICD-10-CM | POA: Diagnosis present

## 2022-01-11 DIAGNOSIS — Z8673 Personal history of transient ischemic attack (TIA), and cerebral infarction without residual deficits: Secondary | ICD-10-CM

## 2022-01-11 DIAGNOSIS — N401 Enlarged prostate with lower urinary tract symptoms: Secondary | ICD-10-CM | POA: Diagnosis present

## 2022-01-11 DIAGNOSIS — K760 Fatty (change of) liver, not elsewhere classified: Secondary | ICD-10-CM | POA: Diagnosis not present

## 2022-01-11 DIAGNOSIS — Z961 Presence of intraocular lens: Secondary | ICD-10-CM | POA: Diagnosis present

## 2022-01-11 DIAGNOSIS — I130 Hypertensive heart and chronic kidney disease with heart failure and stage 1 through stage 4 chronic kidney disease, or unspecified chronic kidney disease: Secondary | ICD-10-CM | POA: Diagnosis present

## 2022-01-11 DIAGNOSIS — Z8249 Family history of ischemic heart disease and other diseases of the circulatory system: Secondary | ICD-10-CM

## 2022-01-11 DIAGNOSIS — Z825 Family history of asthma and other chronic lower respiratory diseases: Secondary | ICD-10-CM

## 2022-01-11 DIAGNOSIS — H3530 Unspecified macular degeneration: Secondary | ICD-10-CM | POA: Diagnosis present

## 2022-01-11 DIAGNOSIS — I4891 Unspecified atrial fibrillation: Secondary | ICD-10-CM

## 2022-01-11 DIAGNOSIS — Z79899 Other long term (current) drug therapy: Secondary | ICD-10-CM

## 2022-01-11 DIAGNOSIS — Z981 Arthrodesis status: Secondary | ICD-10-CM | POA: Diagnosis not present

## 2022-01-11 DIAGNOSIS — Z881 Allergy status to other antibiotic agents status: Secondary | ICD-10-CM

## 2022-01-11 DIAGNOSIS — Z7982 Long term (current) use of aspirin: Secondary | ICD-10-CM

## 2022-01-11 DIAGNOSIS — Z888 Allergy status to other drugs, medicaments and biological substances status: Secondary | ICD-10-CM

## 2022-01-11 DIAGNOSIS — I700 Atherosclerosis of aorta: Secondary | ICD-10-CM | POA: Diagnosis not present

## 2022-01-11 DIAGNOSIS — Z885 Allergy status to narcotic agent status: Secondary | ICD-10-CM

## 2022-01-11 DIAGNOSIS — R1110 Vomiting, unspecified: Secondary | ICD-10-CM

## 2022-01-11 DIAGNOSIS — Z20822 Contact with and (suspected) exposure to covid-19: Secondary | ICD-10-CM | POA: Diagnosis present

## 2022-01-11 DIAGNOSIS — I480 Paroxysmal atrial fibrillation: Secondary | ICD-10-CM | POA: Diagnosis present

## 2022-01-11 DIAGNOSIS — Z7901 Long term (current) use of anticoagulants: Secondary | ICD-10-CM | POA: Diagnosis not present

## 2022-01-11 DIAGNOSIS — J449 Chronic obstructive pulmonary disease, unspecified: Secondary | ICD-10-CM | POA: Diagnosis present

## 2022-01-11 DIAGNOSIS — K52832 Lymphocytic colitis: Secondary | ICD-10-CM | POA: Diagnosis not present

## 2022-01-11 DIAGNOSIS — Z96651 Presence of right artificial knee joint: Secondary | ICD-10-CM | POA: Diagnosis present

## 2022-01-11 DIAGNOSIS — I5020 Unspecified systolic (congestive) heart failure: Secondary | ICD-10-CM

## 2022-01-11 LAB — CBC WITH DIFFERENTIAL/PLATELET
Abs Immature Granulocytes: 0.07 10*3/uL (ref 0.00–0.07)
Basophils Absolute: 0 10*3/uL (ref 0.0–0.1)
Basophils Relative: 0 %
Eosinophils Absolute: 0.1 10*3/uL (ref 0.0–0.5)
Eosinophils Relative: 2 %
HCT: 31.8 % — ABNORMAL LOW (ref 39.0–52.0)
Hemoglobin: 11 g/dL — ABNORMAL LOW (ref 13.0–17.0)
Immature Granulocytes: 1 %
Lymphocytes Relative: 8 %
Lymphs Abs: 0.6 10*3/uL — ABNORMAL LOW (ref 0.7–4.0)
MCH: 29.7 pg (ref 26.0–34.0)
MCHC: 34.6 g/dL (ref 30.0–36.0)
MCV: 85.9 fL (ref 80.0–100.0)
Monocytes Absolute: 0.6 10*3/uL (ref 0.1–1.0)
Monocytes Relative: 9 %
Neutro Abs: 5.7 10*3/uL (ref 1.7–7.7)
Neutrophils Relative %: 80 %
Platelets: 243 10*3/uL (ref 150–400)
RBC: 3.7 MIL/uL — ABNORMAL LOW (ref 4.22–5.81)
RDW: 13.9 % (ref 11.5–15.5)
WBC: 7.1 10*3/uL (ref 4.0–10.5)
nRBC: 0 % (ref 0.0–0.2)

## 2022-01-11 LAB — COMPREHENSIVE METABOLIC PANEL
ALT: 13 U/L (ref 0–44)
AST: 20 U/L (ref 15–41)
Albumin: 3.5 g/dL (ref 3.5–5.0)
Alkaline Phosphatase: 55 U/L (ref 38–126)
Anion gap: 10 (ref 5–15)
BUN: 17 mg/dL (ref 8–23)
CO2: 25 mmol/L (ref 22–32)
Calcium: 7.5 mg/dL — ABNORMAL LOW (ref 8.9–10.3)
Chloride: 101 mmol/L (ref 98–111)
Creatinine, Ser: 0.78 mg/dL (ref 0.61–1.24)
GFR, Estimated: >60 mL/min (ref >60)
Glucose, Bld: 108 mg/dL — ABNORMAL HIGH (ref 70–99)
Potassium: 2.6 mmol/L — CL (ref 3.5–5.1)
Sodium: 136 mmol/L (ref 135–145)
Total Bilirubin: 0.6 mg/dL (ref 0.3–1.2)
Total Protein: 7.1 g/dL (ref 6.5–8.1)

## 2022-01-11 LAB — URINALYSIS, ROUTINE W REFLEX MICROSCOPIC
Bacteria, UA: NONE SEEN
Bilirubin Urine: NEGATIVE
Glucose, UA: NEGATIVE mg/dL
Hgb urine dipstick: NEGATIVE
Ketones, ur: 20 mg/dL — AB
Nitrite: NEGATIVE
Protein, ur: 30 mg/dL — AB
Specific Gravity, Urine: 1.021 (ref 1.005–1.030)
pH: 5 (ref 5.0–8.0)

## 2022-01-11 LAB — MAGNESIUM
Magnesium: 0.6 mg/dL — CL (ref 1.7–2.4)
Magnesium: 1 mg/dL — ABNORMAL LOW (ref 1.7–2.4)

## 2022-01-11 LAB — RESP PANEL BY RT-PCR (FLU A&B, COVID) ARPGX2
Influenza A by PCR: NEGATIVE
Influenza B by PCR: NEGATIVE
SARS Coronavirus 2 by RT PCR: NEGATIVE

## 2022-01-11 LAB — TROPONIN I (HIGH SENSITIVITY)
Troponin I (High Sensitivity): 11 ng/L (ref <18)
Troponin I (High Sensitivity): 11 ng/L (ref <18)

## 2022-01-11 LAB — BRAIN NATRIURETIC PEPTIDE: B Natriuretic Peptide: 540.1 pg/mL — ABNORMAL HIGH (ref 0.0–100.0)

## 2022-01-11 LAB — LIPASE, BLOOD: Lipase: 45 U/L (ref 11–51)

## 2022-01-11 LAB — LACTIC ACID, PLASMA: Lactic Acid, Venous: 1 mmol/L (ref 0.5–1.9)

## 2022-01-11 MED ORDER — ONDANSETRON HCL 4 MG/2ML IJ SOLN: 4.0000 mg | Freq: Four times a day (QID) | INTRAMUSCULAR | Status: DC | PRN

## 2022-01-11 MED ORDER — APIXABAN 5 MG PO TABS
5.0000 mg | ORAL_TABLET | Freq: Two times a day (BID) | ORAL | Status: DC
  Administered 2022-01-11 – 2022-01-15 (×8): 5 mg via ORAL
  Filled 2022-01-11 (×8): qty 1

## 2022-01-11 MED ORDER — OXYCODONE HCL 5 MG PO TABS
5.0000 mg | ORAL_TABLET | ORAL | Status: DC | PRN
  Administered 2022-01-12 – 2022-01-14 (×8): 5 mg via ORAL
  Filled 2022-01-11 (×8): qty 1

## 2022-01-11 MED ORDER — IOHEXOL 9 MG/ML PO SOLN: 1000.0000 mL | ORAL | Status: AC

## 2022-01-11 MED ORDER — DILTIAZEM HCL-DEXTROSE 125-5 MG/125ML-% IV SOLN (PREMIX): 5.0000 mg/h | INTRAVENOUS | Status: DC

## 2022-01-11 MED ORDER — ONDANSETRON HCL 4 MG/2ML IJ SOLN
4.0000 mg | Freq: Once | INTRAMUSCULAR | Status: AC
  Administered 2022-01-11: 4 mg via INTRAVENOUS
  Filled 2022-01-11: qty 2

## 2022-01-11 MED ORDER — TAMSULOSIN HCL 0.4 MG PO CAPS
0.4000 mg | ORAL_CAPSULE | Freq: Two times a day (BID) | ORAL | Status: DC
  Administered 2022-01-11 – 2022-01-15 (×8): 0.4 mg via ORAL
  Filled 2022-01-11 (×8): qty 1

## 2022-01-11 MED ORDER — POTASSIUM CHLORIDE 10 MEQ/100ML IV SOLN
10.0000 meq | INTRAVENOUS | Status: AC
  Administered 2022-01-11: 10 meq via INTRAVENOUS
  Filled 2022-01-11: qty 100

## 2022-01-11 MED ORDER — PANTOPRAZOLE SODIUM 40 MG IV SOLR
40.0000 mg | Freq: Two times a day (BID) | INTRAVENOUS | Status: DC
  Administered 2022-01-11 – 2022-01-13 (×5): 40 mg via INTRAVENOUS
  Filled 2022-01-11 (×5): qty 10

## 2022-01-11 MED ORDER — MAGNESIUM SULFATE 2 GM/50ML IV SOLN: 2.0000 g | Freq: Once | INTRAVENOUS | Status: DC

## 2022-01-11 MED ORDER — POTASSIUM CHLORIDE 10 MEQ/100ML IV SOLN
10.0000 meq | INTRAVENOUS | Status: AC
  Administered 2022-01-11 (×2): 10 meq via INTRAVENOUS
  Filled 2022-01-11 (×2): qty 100

## 2022-01-11 MED ORDER — LEVALBUTEROL TARTRATE 45 MCG/ACT IN AERO: 1.0000 | INHALATION_SPRAY | Freq: Once | RESPIRATORY_TRACT | Status: DC

## 2022-01-11 MED ORDER — MORPHINE SULFATE (PF) 4 MG/ML IV SOLN
4.0000 mg | Freq: Once | INTRAVENOUS | Status: AC
  Administered 2022-01-11: 4 mg via INTRAVENOUS
  Filled 2022-01-11: qty 1

## 2022-01-11 MED ORDER — ACETAMINOPHEN 325 MG PO TABS: 650.0000 mg | ORAL_TABLET | Freq: Four times a day (QID) | ORAL | Status: DC | PRN

## 2022-01-11 MED ORDER — IOHEXOL 300 MG/ML  SOLN
100.0000 mL | Freq: Once | INTRAMUSCULAR | Status: AC | PRN
  Administered 2022-01-11: 100 mL via INTRAVENOUS

## 2022-01-11 MED ORDER — MAGNESIUM SULFATE 2 GM/50ML IV SOLN
2.0000 g | Freq: Once | INTRAVENOUS | Status: AC
  Administered 2022-01-11: 2 g via INTRAVENOUS
  Filled 2022-01-11: qty 50

## 2022-01-11 MED ORDER — POTASSIUM CHLORIDE 2 MEQ/ML IV SOLN
INTRAVENOUS | Status: DC
  Filled 2022-01-11 (×2): qty 1000

## 2022-01-11 MED ORDER — VANCOMYCIN HCL IN DEXTROSE 1-5 GM/200ML-% IV SOLN: 1000.0000 mg | Freq: Once | INTRAVENOUS | Status: DC

## 2022-01-11 MED ORDER — ASPIRIN EC 81 MG PO TBEC
81.0000 mg | DELAYED_RELEASE_TABLET | Freq: Every day | ORAL | Status: DC
  Administered 2022-01-12 – 2022-01-15 (×4): 81 mg via ORAL
  Filled 2022-01-11 (×4): qty 1

## 2022-01-11 MED ORDER — SODIUM CHLORIDE (PF) 0.9 % IJ SOLN
INTRAMUSCULAR | Status: AC
  Filled 2022-01-11: qty 50

## 2022-01-11 MED ORDER — BRIMONIDINE TARTRATE 0.2 % OP SOLN
1.0000 [drp] | Freq: Two times a day (BID) | OPHTHALMIC | Status: DC
  Administered 2022-01-13 – 2022-01-15 (×2): 1 [drp] via OPHTHALMIC
  Filled 2022-01-11: qty 5

## 2022-01-11 MED ORDER — IOHEXOL 9 MG/ML PO SOLN
ORAL | Status: AC
  Administered 2022-01-11: 500 mL
  Filled 2022-01-11: qty 1000

## 2022-01-11 MED ORDER — LACTATED RINGERS IV SOLN: INTRAVENOUS | Status: DC

## 2022-01-11 MED ORDER — POTASSIUM CHLORIDE 10 MEQ/100ML IV SOLN
10.0000 meq | Freq: Once | INTRAVENOUS | Status: AC
  Administered 2022-01-11: 10 meq via INTRAVENOUS
  Filled 2022-01-11: qty 100

## 2022-01-11 MED ORDER — ALBUTEROL SULFATE (2.5 MG/3ML) 0.083% IN NEBU
2.5000 mg | INHALATION_SOLUTION | Freq: Four times a day (QID) | RESPIRATORY_TRACT | Status: DC | PRN
  Administered 2022-01-12: 2.5 mg via RESPIRATORY_TRACT
  Filled 2022-01-11: qty 3

## 2022-01-11 MED ORDER — LEVALBUTEROL TARTRATE 45 MCG/ACT IN AERO: 2.0000 | INHALATION_SPRAY | Freq: Four times a day (QID) | RESPIRATORY_TRACT | Status: DC | PRN

## 2022-01-11 MED ORDER — SODIUM CHLORIDE 0.9 % IV SOLN
2.0000 g | Freq: Three times a day (TID) | INTRAVENOUS | Status: DC
  Administered 2022-01-12 – 2022-01-13 (×4): 2 g via INTRAVENOUS
  Filled 2022-01-11 (×5): qty 12.5

## 2022-01-11 MED ORDER — VANCOMYCIN HCL 1500 MG/300ML IV SOLN
1500.0000 mg | INTRAVENOUS | Status: DC
  Administered 2022-01-11 – 2022-01-12 (×2): 1500 mg via INTRAVENOUS
  Filled 2022-01-11 (×2): qty 300

## 2022-01-11 MED ORDER — LATANOPROST 0.005 % OP SOLN
1.0000 [drp] | Freq: Every day | OPHTHALMIC | Status: DC
  Administered 2022-01-12 – 2022-01-14 (×3): 1 [drp] via OPHTHALMIC
  Filled 2022-01-11: qty 2.5

## 2022-01-11 MED ORDER — CALCIUM GLUCONATE-NACL 1-0.675 GM/50ML-% IV SOLN
1.0000 g | Freq: Once | INTRAVENOUS | Status: AC
  Administered 2022-01-12: 1000 mg via INTRAVENOUS
  Filled 2022-01-11: qty 50

## 2022-01-11 MED ORDER — MAGNESIUM SULFATE 4 GM/100ML IV SOLN
4.0000 g | Freq: Once | INTRAVENOUS | Status: AC
  Administered 2022-01-11: 4 g via INTRAVENOUS
  Filled 2022-01-11: qty 100

## 2022-01-11 MED ORDER — ACETAMINOPHEN 650 MG RE SUPP: 650.0000 mg | Freq: Four times a day (QID) | RECTAL | Status: DC | PRN

## 2022-01-11 MED ORDER — POTASSIUM CHLORIDE 10 MEQ/100ML IV SOLN: 10.0000 meq | INTRAVENOUS | Status: DC

## 2022-01-11 MED ORDER — LOPERAMIDE HCL 2 MG PO CAPS
2.0000 mg | ORAL_CAPSULE | ORAL | Status: DC | PRN
  Administered 2022-01-12 (×3): 2 mg via ORAL
  Filled 2022-01-11 (×3): qty 1

## 2022-01-11 MED ORDER — LEVALBUTEROL HCL 0.63 MG/3ML IN NEBU
0.6300 mg | INHALATION_SOLUTION | Freq: Once | RESPIRATORY_TRACT | Status: AC
  Administered 2022-01-11: 0.63 mg via RESPIRATORY_TRACT
  Filled 2022-01-11: qty 3

## 2022-01-11 MED ORDER — SODIUM CHLORIDE 0.9 % IV SOLN
2.0000 g | Freq: Once | INTRAVENOUS | Status: AC
  Administered 2022-01-11: 2 g via INTRAVENOUS
  Filled 2022-01-11: qty 12.5

## 2022-01-11 MED ORDER — MORPHINE SULFATE (PF) 2 MG/ML IV SOLN
2.0000 mg | INTRAVENOUS | Status: DC | PRN
  Administered 2022-01-11 – 2022-01-12 (×2): 2 mg via INTRAVENOUS
  Filled 2022-01-11 (×2): qty 1

## 2022-01-11 MED ORDER — SODIUM CHLORIDE 0.9 % IV SOLN: 2.0000 g | Freq: Three times a day (TID) | INTRAVENOUS | Status: DC

## 2022-01-11 MED ORDER — METOPROLOL TARTRATE 25 MG PO TABS
25.0000 mg | ORAL_TABLET | Freq: Two times a day (BID) | ORAL | Status: DC
  Administered 2022-01-11 – 2022-01-15 (×8): 25 mg via ORAL
  Filled 2022-01-11 (×8): qty 1

## 2022-01-11 NOTE — Assessment & Plan Note (Addendum)
Possibly related to radiation therapy. Patient managed with supportive care. Antiemetics and IV fluids provided with improvement of symptoms. Currently with resolved nausea and vomiting. Diarrhea is still persistent but has improved. 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 ?

## 2022-01-11 NOTE — Assessment & Plan Note (Addendum)
Likely related to dehydration from diarrhea and emesis. Patient was managed on diltiazem drip which has been discontinued. Heart rate currently well controlled. Patient in atrial fibrillation currently. On metoprolol and Eliquis. Continue on discharge. ?

## 2022-01-11 NOTE — Assessment & Plan Note (Addendum)
Chronic with no acute illness. Lasix held secondary to dehydration on admission from diarrhea and emesis. Resume on discharge. ?

## 2022-01-11 NOTE — Assessment & Plan Note (Addendum)
Followed by Dr. Eulogio Ditch and Dr. Tammi Klippel, currently undergoing radiation therapy. ?

## 2022-01-11 NOTE — Progress Notes (Signed)
Pharmacy Antibiotic Note ? ?Ryan [name] is a 76 y.o. male admitted on 01/11/2022 with sepsis.  Pharmacy has been consulted for Vanco, Cefepime dosing. ? ?Active Problem(s): nausea, vomiting, diarrhea, abdominal pain, dysuria. ? ?PMH: COPD, CAD status post CABG in November 2022, CHF, paroxysmal atrial fibrillation currently on Eliquis, prostate cancer currently undergoing radiation therapy  ? ?ID: Sepsis. Afebrile, WBC 7.1. Scr <1 ? ?Vanco 4/9>> ?Cefepime 4/9>> ? ?Plan: ?Cefepime 2g IV q8hr ?Vancomycin 1500 mg IV Q 24 hrs. Goal AUC 400-550. ?Expected AUC: 467 ?SCr used: 0.8 ? ? ? ? ?Height: 5\' 4"  (162.6 cm) ?Weight: 73.5 kg (162 lb) ?IBW/kg (Calculated) : 59.2 ? ?Temp (24hrs), Avg:98.1 ?F (36.7 ?C), Min:98.1 ?F (36.7 ?C), Max:98.1 ?F (36.7 ?C) ? ?Recent Labs  ?Lab 01/11/22 ?0959 01/11/22 ?1014  ?WBC 7.1  --   ?CREATININE 0.78  --   ?LATICACIDVEN  --  1.0  ?  ?Estimated Creatinine Clearance: 74.4 mL/min (by C-G formula based on SCr of 0.78 mg/dL).   ? ?Allergies  ?Allergen Reactions  ? Albumin (Human) Anaphylaxis  ? Polymyxin B-Trimethoprim Swelling  ?  Eye drops made eyes swell  ? Pseudoephedrine Other (See Comments)  ?  Stomach cramps  ? Codeine Hives, Itching and Rash  ? Guaiacol Other (See Comments)  ?  Hallucinations  ? Statins Other (See Comments)  ?  Muscle cramps ?  ? Gabapentin Other (See Comments)  ?  Pt unsure of sensitivity  ? Meloxicam Other (See Comments)  ?  Pt unsure of sensitivity  ? Peppermint Flavor Other (See Comments)  ?  Severe cramping  ? Pseudoephedrine-Guaifenesin Nausea And Vomiting  ?  Stomach cramps  ? Rosuvastatin Calcium Other (See Comments)  ?  Muscle aches  ? Tapentadol Other (See Comments)  ?  Pt unsure of sensitivity  ? Ciprofloxacin Hives, Itching, Nausea Only and Rash  ? Moxifloxacin Nausea Only and Other (See Comments)  ?  Headaches, stomach cramps ?  ? Rofecoxib Other (See Comments)  ?  Stomach cramping  ? ? ?Ryen Heitmeyer S. Alford Highland, PharmD, BCPS ?Clinical Staff  Pharmacist ?[email or web address] ? ?Alford Highland, [employer] ?01/11/2022 6:46 PM ? ?

## 2022-01-11 NOTE — H&P (Signed)
?History and Physical  ? ? Ryan [name] VXB:[id number] DOB: December 26, 1945 DOA: 01/11/2022 ? ?PCP: Ryan Lukes, MD  ?Patient coming from: home ? ?I have personally briefly reviewed patient's old medical records in [place] ? ?Chief Complaint: nausea, vomiting, diarrhea, pain ? ?HPI: Ryan [name] is Ryan [name] 76 y.o. male with medical history significant of prostate cancer on radiation therapy, CAD s/p CABG, COPD, atrial fibrillation on eliquis, hx lymphocytic colitis, and multiple other medical issues presenting with nausea, vomiting, diarrhea, and c/o pain.   ? ?He notes since beginning radiation, he's had some of these symptoms, but now worse.  Notes looking forward to the weekend, bc he gets relief.  In the beginning of this week, he noted nausea, vomiting, diarrhea, and decreased appetite that have progressively worsened.  He notes chronic L chest pain since bypass surgery as well as pain in L groin.  He notes shaking chills started yesterday.  No fevers, no SOB. notes generalized abdominal pain.  Chronic R>L lower extremity edema.  He's been having diarrhea about 2x Ryan [name] day and vomiting phlegm mostly in the morning.  Denies smoking, quit years ago.  Very occasional etoh (once Ryan [name] week). ? ?ED Course: Labs, imaging, electrolyte supplementation.  Hospitalist to admit. ? ?Review of Systems: As per HPI otherwise all other systems reviewed and are negative. ? ?Past Medical History:  ?Diagnosis Date  ? Anemia   ? Anticoagulant long-term use   ? eliquis--- mananged by cardiology  ? BPH with urinary obstruction   ? CAD (coronary artery disease)   ? cardiologist--- dr g. taylor--- cath 04-22-2011  moderate LM stenosis, borderline sig pRCA, sig stenosis mid to distal LAD ;  aggressive medical therapy;   cath 07-21-2021 for chest pain w/ ST changes,  severe disease;  s/p CABG x4 07-22-2021  ? Chronic diastolic CHF (congestive heart failure) (Ryan [name]) 03/2018  ? followed by cardiology  ? CKD (chronic kidney disease), stage III  (Ryan [name])   ? COPD (chronic obstructive pulmonary disease) (Ryan [name])   ? followed by pcp  ? DOE (dyspnea on exertion)   ? per pt when walk >100yds,  per pt rides outside bike 4-8 miles daily without sob,  household chores and yard work without sob  ? Eczema   ? GERD (gastroesophageal reflux disease)   ? Glaucoma, left eye   ? Heart murmur   ? Hemorrhoids   ? History of adenomatous polyp of colon   ? History of coma 1962  ? 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 History of kidney stones   ? History of rheumatic fever as Ryan [name] child   ? History of syncope 02/2014  ? in setting AFlutter w/ RVR of known PAF admission in epic  ? History of transient ischemic attack (TIA) 03/23/2021  ? 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 History of urinary retention   ? Hypertension   ? Lumbar radiculopathy   ? per pt with left hip/ leg pain  ? Lymphocytic colitis   ? followed by dr v. Bryan Lemma--- dx by biopsy 01-28-2021  ? Macular degeneration of both eyes   ? followed by dr c. Cordelia Pen--- bilateral eye injection every 6 wks  ? Malignant neoplasm prostate Ryan [name]) 07/2021  ? urologist-- dr Ryan [name];  dx 10/ 2022,  Gleason 5+5, PSA 1.8  ? Osteoporosis 05/31/2016  ?  PAF (paroxysmal atrial fibrillation) ([place]) 03/2012  ? cardiologist-- dr g. taylor;  first dx 06/ 2013 AFlutter w/ RVR  ? Vitamin D deficiency   ? ? ?Past Surgical History:  ?Procedure Laterality Date  ? ANTERIOR CERVICAL DECOMP/DISCECTOMY FUSION  06/08/2002  ? @[facility] ;  C5--C7  ? APPENDECTOMY  1953  ? CARDIAC CATHETERIZATION  04/22/2011  ? moderate left main and RCA stenosis not significant by FFR and IVUS on medical therapy  ? CARDIOVERSION N/Ryan [name] 04/25/2018  ? Procedure: CARDIOVERSION;  Surgeon: Ryan Latch, MD;  Location: [facility];  Service: Cardiovascular;  Laterality: N/Ryan [name];  ? CATARACT EXTRACTION W/ INTRAOCULAR LENS IMPLANT Bilateral 2021  ?  COLONOSCOPY WITH ESOPHAGOGASTRODUODENOSCOPY (EGD)  01/28/2021  ? by Ryan [name]  ? CORONARY ARTERY BYPASS GRAFT Ryan [name] 07/22/2021  ? Procedure: 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, ON PUMP, USING LEFT INTERNAL MAMMARY ARTERY AND ENDOSCOPICALLY HARVESTED RIGHT GREATER SAPHENOUS VEIN;  Surgeon: Ryan Nakayama, MD;  Location: [facility];  Service: Open Heart Surgery;  Laterality: N/Ryan [name];  ? ENDOVEIN HARVEST OF GREATER SAPHENOUS VEIN  07/22/2021  ? Procedure: ENDOVEIN HARVEST OF GREATER SAPHENOUS VEIN;  Surgeon: Ryan Nakayama, MD;  Location: [facility];  Service: Open Heart Surgery;;  ? EXTRACORPOREAL SHOCK WAVE LITHOTRIPSY    ? x2  1990s  ? FOOT SURGERY Right 1970  ? calcification removed from top of foot  ? GOLD SEED IMPLANT Ryan [name] 11/28/2021  ? Procedure: GOLD SEED IMPLANT;  Surgeon: Ryan Aloe, MD;  Location: [facility];  Service: Urology;  Laterality: Ryan [name];  ? KNEE ARTHROSCOPY Right 1991  ? LAPAROSCOPIC CHOLECYSTECTOMY  2000  ? LEFT HEART CATH AND CORONARY ANGIOGRAPHY Ryan [name] 07/21/2021  ? Procedure: LEFT HEART CATH AND CORONARY ANGIOGRAPHY;  Surgeon: Ryan Harp, MD;  Location: [facility];  Service: Cardiovascular;  Laterality: N/Ryan [name];  ? SPACE OAR INSTILLATION Ryan [name] 11/28/2021  ? Procedure: SPACE OAR INSTILLATION;  Surgeon: Ryan Aloe, MD;  Location[facility];  Service: Urology;  Laterality: N/Daeja Helderman;  ? TEE WITHOUT CARDIOVERSION N/Ryan [name] 07/22/2021  ? Procedure: TRANSESOPHAGEAL ECHOCARDIOGRAM (TEE);  Surgeon: Ryan Nakayama, MD;  Location: [facility];  Service: Open Heart Surgery;  Laterality: Ryan [name];  ? TOTAL KNEE ARTHROPLASTY Right 08/14/2009  ? @[facility]   ? TRANSURETHRAL RESECTION OF PROSTATE Ryan [name] 07/17/2021  ? Procedure: TRANSURETHRAL RESECTION OF THE PROSTATE (TURP);  Surgeon: Ryan Gallo, MD;  [name];  Service: Urology;  Laterality: N/Sae Handrich;  1 HR  ? ? ?Social History ? reports that he quit smoking about 12 years ago. His smoking use  included cigarettes. He has Ruel Dimmick 25.00 pack-year smoking history. He quit smokeless tobacco use about 28 years ago.  His smokeless tobacco use included chew. He reports current alcohol use of about 2.0 standard drinks per week. He reports that he does not use drugs. ? ?Allergies  ?Allergen Reactions  ? Albumin (Human) Anaphylaxis  ? Polymyxin B-Trimethoprim Swelling  ?  Eye drops made eyes swell  ? Pseudoephedrine Other (See Comments)  ?  Stomach cramps  ? Codeine Hives, Itching and Rash  ? Guaiacol Other (See Comments)  ?  Hallucinations  ? Statins Other (See Comments)  ?  Muscle cramps ?  ? Gabapentin Other (See Comments)  ?  Pt unsure of sensitivity  ? Meloxicam Other (See Comments)  ?  Pt unsure of sensitivity  ? Peppermint Flavor Other (See Comments)  ?  Severe cramping  ? Pseudoephedrine-Guaifenesin Nausea And Vomiting  ?  Stomach cramps  ?  Rosuvastatin Calcium Other (See Comments)  ?  Muscle aches  ? Tapentadol Other (See Comments)  ?  Pt unsure of sensitivity  ? Ciprofloxacin Hives, Itching, Nausea Only and Rash  ? Moxifloxacin Nausea Only and Other (See Comments)  ?  Headaches, stomach cramps ?  ? Rofecoxib Other (See Comments)  ?  Stomach cramping  ? ? ?Family History  ?Problem Relation Age of Onset  ? Heart disease Mother   ? Diabetes Mother   ? Cirrhosis Mother   ? Emphysema Mother   ?     never smoked but 2nd hand through her spouse  ? Hypertension Mother   ? Macular degeneration Mother   ? Heart disease Father   ? Cancer Father   ?     prostate  ? Hyperlipidemia Father   ? Hypertension Father   ? Varicose Veins Father   ? Heart attack Father   ? Peripheral vascular disease Father   ? Heart disease Sister   ? Arthritis Sister   ? Hyperlipidemia Sister   ? Obesity Sister   ? Macular degeneration Sister   ? Heart disease Brother   ?     5 stents  ? Hyperlipidemia Brother   ? Macular degeneration Maternal Grandfather   ? Cirrhosis Sister   ? Obesity Sister   ? Arthritis Sister   ? Heart disease Sister   ?  Obesity Sister   ? Liver disease Other   ? Prostate cancer Other   ? Coronary artery disease Other   ? Colon cancer Neg Hx   ? Esophageal cancer Neg Hx   ? Rectal cancer Neg Hx   ? Stomach cancer Neg Hx   ? ? ? ?Pri

## 2022-01-11 NOTE — Assessment & Plan Note (Addendum)
Resolved with repletion. ?

## 2022-01-11 NOTE — Assessment & Plan Note (Addendum)
Recurrent. Resolved with repletion. ?

## 2022-01-11 NOTE — Assessment & Plan Note (Addendum)
Patient is on aspirin, Repatha and metoprolol. Continue aspirin and metoprolol. ?

## 2022-01-11 NOTE — ED Provider Notes (Signed)
?[facility] DEPT ?Provider Note ? ? ?CSN: [id number] ?Arrival date & time: 01/11/22  0936 ? ?  ? ?History ? ?Chief Complaint  ?Patient presents with  ? Chest Pain  ? ? ?Ryan [name] is a 76 y.o. male. ? ?Patient is a 76 year old male with a history of COPD, CAD status post CABG in November 2022, CHF, paroxysmal atrial fibrillation currently on Eliquis, prostate cancer currently undergoing radiation therapy who is presenting today with complaints of pain, nausea, vomiting and diarrhea.  Patient reports the symptoms have just been gradually worsening.  Now he cannot hold down any solid food as he reports he vomits it back up.  He is having significant pain throughout his abdomen but also a severe sharp pain in his left side.  He feels short of breath with any exertion and reports he is nauseated all the time.  He is trying to take Tylenol at home but it is not helping and he has no medication for nausea.  He has some mild burning when he urinates but has not seen any blood in his urine.  He has not noticed any pain or swelling in his legs.  He has not had cough, sputum production or fever.  He feels generally weak all the time.  He did stop taking his Lasix this week because it was affecting the radiation. ? ?The history is provided by the patient, the spouse and medical records.  ?Chest Pain ? ?  ? ?Home Medications ?Prior to Admission medications   ?Medication Sig Start Date End Date Taking? Authorizing Provider  ?apixaban (ELIQUIS) 5 MG TABS tablet Take 1 tablet (5 mg total) by mouth 2 (two) times daily. 11/30/21   Festus Aloe, MD  ?aspirin EC 81 MG EC tablet Take 1 tablet (81 mg total) by mouth daily. Swallow whole. 08/01/21   Antony Odea, PA-C  ?Baclofen 5 MG TABS Take 5 mg by mouth 3 (three) times daily as needed (for hiccups). ?Patient not taking: Reported on 11/25/2021 08/01/21   Antony Odea, PA-C  ?brimonidine (ALPHAGAN) 0.2 % ophthalmic solution Place 1  drop into the left eye in the morning and at bedtime.  09/10/17   [provider]  ?clobetasol (TEMOVATE) 0.05 % external solution APPLY 1 APPLICATION TOPICALLY TWICE DAILY ?Patient taking differently: 2 (two) times daily as needed. APPLY 1 APPLICATION TOPICALLY TWICE DAILY 11/20/16   [name], MD  ?Evolocumab (REPATHA SURECLICK) 335 MG/ML SOAJ Inject 140 mg into the skin every 14 (fourteen) days. ?Patient taking differently: Inject 140 mg into the skin every 14 (fourteen) days. 09/23/21   [name], MD  ?Ferrous Fumarate (HEMOCYTE) 324 (106 Fe) MG TABS tablet Take 1 tablet (106 mg of iron total) by mouth daily. ?Patient taking differently: Take 1 tablet by mouth at bedtime. 09/24/21   [name], MD  ?furosemide (LASIX) 20 MG tablet TAKE 1 TABLET BY MOUTH DAILY AS  NEEDED 12/22/21   [name], MD  ?latanoprost (XALATAN) 0.005 % ophthalmic solution Place 1 drop into the left eye at bedtime.     [provider]  ?levalbuterol (XOPENEX HFA) 45 MCG/ACT inhaler INHALE 2 PUFFS BY MOUTH INTO THE LUNGS EVERY 4 HOURS AS NEEDED FOR WHEEZING ?Patient taking differently: 2 puffs every 4 (four) hours as needed for shortness of breath or wheezing. Per pt uses nightly one -two puff on regular basis to prevent wheezing 08/04/21   [name], MD  ?loperamide (IMODIUM) 2 MG  capsule TAKE 1 CAPSULE(2 MG) BY MOUTH EVERY 6 HOURS AS NEEDED FOR DIARRHEA OR LOOSE STOOLS 01/08/22   Tyler Pita, MD  ?MAGnesium-Oxide 400 (240 Mg) MG tablet Take 1 tablet (400 mg total) by mouth 3 (three) times daily. ?Patient taking differently: Take 400 mg by mouth 2 (two) times daily. 09/23/21   [name], MD  ?metoprolol tartrate (LOPRESSOR) 25 MG tablet TAKE 1 TABLET BY MOUTH  TWICE DAILY ?Patient taking differently: Take 25 mg by mouth 2 (two) times daily. 06/11/21   Evans Lance, MD  ?Multiple Vitamins-Minerals (PRESERVISION AREDS 2) CAPS Take by mouth.    [provider]  ?nitroGLYCERIN  (NITROSTAT) 0.4 MG SL tablet Place 1 tablet (0.4 mg total) under the tongue every 5 (five) minutes as needed for chest pain. 04/14/20   Allie Bossier, MD  ?omeprazole (PRILOSEC OTC) 20 MG tablet Take 20 mg by mouth daily.    [provider]  ?Ranibizumab (LUCENTIS IO) Inject 1 Dose into the eye every 6 (six) weeks. Bilateral eye by Dr Gerarda Fraction    [provider]  ?tamsulosin (FLOMAX) 0.4 MG CAPS capsule Take 0.4 mg by mouth 2 (two) times daily.    [provider]  ?vitamin E 180 MG (400 UNITS) capsule Take 40,000 Units by mouth daily.    [provider]  ?   ? ?Allergies    ?Albumin (human), Polymyxin b-trimethoprim, Pseudoephedrine, Codeine, Guaiacol, Statins, Gabapentin, Meloxicam, Peppermint flavor, Pseudoephedrine-guaifenesin, Rosuvastatin calcium, Tapentadol, Ciprofloxacin, Moxifloxacin, and Rofecoxib   ? ?Review of Systems   ?Review of Systems  ?Cardiovascular:  Positive for chest pain.  ? ?Physical Exam ?Updated Vital Signs ?BP 101/75   Pulse (!) 116   Temp 98.1 ?F (36.7 ?C) (Oral)   Resp (!) 22   SpO2 95%  ?Physical Exam ?Vitals and nursing note reviewed.  ?Constitutional:   ?   General: He is not in acute distress. ?   Appearance: He is ill-appearing.  ?HENT:  ?   Head: Normocephalic and atraumatic.  ?   Mouth/Throat:  ?   Mouth: Mucous membranes are dry.  ?Eyes:  ?   Conjunctiva/sclera: Conjunctivae normal.  ?   Pupils: Pupils are equal, round, and reactive to light.  ?Cardiovascular:  ?   Rate and Rhythm: Normal rate and regular rhythm.  ?   Heart sounds: No murmur heard. ?Pulmonary:  ?   Effort: Pulmonary effort is normal. No respiratory distress.  ?   Breath sounds: Normal breath sounds. No wheezing or rales.  ?   Comments: Well-healed sternotomy scar.  Pain with palpation along the lateral left ribs.  No rashes identified ?Chest:  ?   Chest wall: Tenderness present.  ?Abdominal:  ?   Palpations: Abdomen is soft.  ?   Tenderness: There is abdominal  tenderness. There is no guarding or rebound.  ?   Comments: Diffuse tenderness throughout with even light palpation  ?Musculoskeletal:     ?   General: No tenderness. Normal range of motion.  ?   Cervical back: Normal range of motion and neck supple.  ?   Right lower leg: No edema.  ?   Left lower leg: No edema.  ?Skin: ?   General: Skin is warm and dry.  ?   Findings: No erythema or rash.  ?Neurological:  ?   Mental Status: He is alert and oriented to person, place, and time. Mental status is at baseline.  ?Psychiatric:     ?  Mood and Affect: Mood normal.     ?   Behavior: Behavior normal.  ? ? ?ED Results / Procedures / Treatments   ?Labs ?(all labs ordered are listed, but only abnormal results are displayed) ?Labs Reviewed  ?URINALYSIS, ROUTINE W REFLEX MICROSCOPIC - Abnormal; Notable for the following components:  ?    Result Value  ? Ketones, ur 20 (*)   ? Protein, ur 30 (*)   ? Leukocytes,Ua TRACE (*)   ? All other components within normal limits  ?CBC WITH DIFFERENTIAL/PLATELET - Abnormal; Notable for the following components:  ? RBC 3.70 (*)   ? Hemoglobin 11.0 (*)   ? HCT 31.8 (*)   ? Lymphs Abs 0.6 (*)   ? All other components within normal limits  ?COMPREHENSIVE METABOLIC PANEL - Abnormal; Notable for the following components:  ? Potassium 2.6 (*)   ? Glucose, Bld 108 (*)   ? Calcium 7.5 (*)   ? All other components within normal limits  ?LIPASE, BLOOD  ?LACTIC ACID, PLASMA  ?TROPONIN I (HIGH SENSITIVITY)  ?TROPONIN I (HIGH SENSITIVITY)  ? ? ?EKG ?EKG Interpretation ? ?Date/Time:  Sunday January 11 2022 10:04:52 EDT ?Ventricular Rate:  107 ?PR Interval:    ?QRS Duration: 121 ?QT Interval:  359 ?QTC Calculation: 456 ?R Axis:   31 ?Text Interpretation: Atrial fibrillation Right bundle branch block No significant change since last tracing Confirmed by Blanchie Dessert [id number]) on 01/11/2022 11:23:04 AM ? ?Radiology ?DG Chest Port 1 View ? ?Result Date: 01/11/2022 ?CLINICAL DATA:  Chest pain EXAM: PORTABLE CHEST  1 VIEW COMPARISON:  08/26/2021 FINDINGS: Diffuse fine interstitial opacity. Generous lung volumes. Mild cardiac enlargement. Prior CABG. Extensive artifact from EKG leads. IMPRESSION: Diffuse fine interstitial likely rela

## 2022-01-11 NOTE — ED Triage Notes (Signed)
Pt arrived via POV, c/o left sided chest pain, abd pain and vomiting. Hx of prostate cancer, undergoing radiation tx currently.  ?

## 2022-01-11 NOTE — ED Provider Notes (Signed)
76 yo on radiation therapy for prostate cancer, presenting with nausea, vomiting, diarrhea, abdominal pain, dysuria. ? ?On eliquis for A Fib ? ?Pending CT abdomen ?Also receiving IV potassium  ? ?Plan for admission pending CT scan ? ?* ? ?- 430 pm -Labs and CT personally reviewed and interpreted, agreed with radiology interpretation for bowel loop dilation consistent with colitis, which in this clinical setting may be related to radiation; less likely infection with no fever or leukocytosis.  Likewise for the bladder wall thickening, no clear evidence of UTI on urinalysis; suspect this is related to radiation treatment for prostate cancer. ? ?Labs show hypomagnesemia and hypokalemia, likely related to diarrhea - will replete IV here. ? ?Patient is stable for medical admission ?  ?Wyvonnia Dusky, MD ?01/11/22 1640 ? ?

## 2022-01-11 NOTE — Assessment & Plan Note (Addendum)
Tachycardia, tachypnea, rigors without clear source. Urine culture and blood cultures with no growth. C. Difficile negative. GI pathogen panel is pending. Patient started empirically on Vancomycin and Cefepime and transitioned to Unasyn with plan for 7 day empiric antibiotic course. Unasyn transitioned to Augmentin on discharge. ?

## 2022-01-11 NOTE — Assessment & Plan Note (Addendum)
Continue home metoprolol ?

## 2022-01-11 NOTE — Assessment & Plan Note (Addendum)
No exacerbation ?

## 2022-01-11 NOTE — Assessment & Plan Note (Addendum)
Chronic. Likely musculoskeletal. Negative troponin. ?

## 2022-01-11 NOTE — ED Notes (Signed)
K 2.6, MD Plunkett notified via phone call.  ?

## 2022-01-11 NOTE — ED Notes (Signed)
Patient transported to CT 

## 2022-01-12 ENCOUNTER — Other Ambulatory Visit: Payer: Self-pay

## 2022-01-12 ENCOUNTER — Ambulatory Visit: Payer: Medicare Other

## 2022-01-12 DIAGNOSIS — R197 Diarrhea, unspecified: Secondary | ICD-10-CM | POA: Diagnosis not present

## 2022-01-12 DIAGNOSIS — R112 Nausea with vomiting, unspecified: Secondary | ICD-10-CM

## 2022-01-12 DIAGNOSIS — E876 Hypokalemia: Secondary | ICD-10-CM

## 2022-01-12 DIAGNOSIS — E860 Dehydration: Secondary | ICD-10-CM

## 2022-01-12 DIAGNOSIS — R630 Anorexia: Secondary | ICD-10-CM

## 2022-01-12 LAB — COMPREHENSIVE METABOLIC PANEL
ALT: 13 U/L (ref 0–44)
AST: 22 U/L (ref 15–41)
Albumin: 2.9 g/dL — ABNORMAL LOW (ref 3.5–5.0)
Alkaline Phosphatase: 49 U/L (ref 38–126)
Anion gap: 11 (ref 5–15)
BUN: 14 mg/dL (ref 8–23)
CO2: 20 mmol/L — ABNORMAL LOW (ref 22–32)
Calcium: 7.3 mg/dL — ABNORMAL LOW (ref 8.9–10.3)
Chloride: 105 mmol/L (ref 98–111)
Creatinine, Ser: 0.88 mg/dL (ref 0.61–1.24)
GFR, Estimated: >60 mL/min (ref >60)
Glucose, Bld: 87 mg/dL (ref 70–99)
Potassium: 2.8 mmol/L — ABNORMAL LOW (ref 3.5–5.1)
Sodium: 136 mmol/L (ref 135–145)
Total Bilirubin: 0.9 mg/dL (ref 0.3–1.2)
Total Protein: 6 g/dL — ABNORMAL LOW (ref 6.5–8.1)

## 2022-01-12 LAB — CBC
HCT: 28.8 % — ABNORMAL LOW (ref 39.0–52.0)
Hemoglobin: 9.6 g/dL — ABNORMAL LOW (ref 13.0–17.0)
MCH: 29.5 pg (ref 26.0–34.0)
MCHC: 33.3 g/dL (ref 30.0–36.0)
MCV: 88.6 fL (ref 80.0–100.0)
Platelets: 215 10*3/uL (ref 150–400)
RBC: 3.25 MIL/uL — ABNORMAL LOW (ref 4.22–5.81)
RDW: 14.1 % (ref 11.5–15.5)
WBC: 7.8 10*3/uL (ref 4.0–10.5)
nRBC: 0 % (ref 0.0–0.2)

## 2022-01-12 LAB — PHOSPHORUS: Phosphorus: 2.8 mg/dL (ref 2.5–4.6)

## 2022-01-12 LAB — C DIFFICILE QUICK SCREEN W PCR REFLEX
C Diff antigen: NEGATIVE
C Diff interpretation: NOT DETECTED
C Diff toxin: NEGATIVE

## 2022-01-12 LAB — BASIC METABOLIC PANEL
Anion gap: 11 (ref 5–15)
BUN: 14 mg/dL (ref 8–23)
CO2: 17 mmol/L — ABNORMAL LOW (ref 22–32)
Calcium: 7.7 mg/dL — ABNORMAL LOW (ref 8.9–10.3)
Chloride: 107 mmol/L (ref 98–111)
Creatinine, Ser: 0.91 mg/dL (ref 0.61–1.24)
GFR, Estimated: >60 mL/min (ref >60)
Glucose, Bld: 79 mg/dL (ref 70–99)
Potassium: 4.2 mmol/L (ref 3.5–5.1)
Sodium: 135 mmol/L (ref 135–145)

## 2022-01-12 LAB — MAGNESIUM
Magnesium: 2.1 mg/dL (ref 1.7–2.4)
Magnesium: 1.8 mg/dL (ref 1.7–2.4)

## 2022-01-12 LAB — C-REACTIVE PROTEIN: CRP: 16.6 mg/dL — ABNORMAL HIGH (ref <1.0)

## 2022-01-12 MED ORDER — POTASSIUM CHLORIDE CRYS ER 20 MEQ PO TBCR
20.0000 meq | EXTENDED_RELEASE_TABLET | Freq: Once | ORAL | Status: AC
Start: 2022-01-12 — End: 2022-01-12
  Administered 2022-01-12: 20 meq via ORAL
  Filled 2022-01-12: qty 1

## 2022-01-12 MED ORDER — POTASSIUM CHLORIDE 10 MEQ/100ML IV SOLN
10.0000 meq | INTRAVENOUS | Status: AC
  Administered 2022-01-12 (×4): 10 meq via INTRAVENOUS
  Filled 2022-01-12 (×4): qty 100

## 2022-01-12 MED ORDER — CALCIUM GLUCONATE-NACL 1-0.675 GM/50ML-% IV SOLN
1.0000 g | Freq: Once | INTRAVENOUS | Status: AC
  Administered 2022-01-12: 1000 mg via INTRAVENOUS
  Filled 2022-01-12: qty 50

## 2022-01-12 MED ORDER — BOOST / RESOURCE BREEZE PO LIQD CUSTOM
1.0000 | Freq: Three times a day (TID) | ORAL | Status: DC
  Administered 2022-01-12 – 2022-01-13 (×2): 1 via ORAL

## 2022-01-12 MED ORDER — LOPERAMIDE HCL 2 MG PO CAPS: 2.0000 mg | ORAL_CAPSULE | Freq: Four times a day (QID) | ORAL | Status: DC | PRN

## 2022-01-12 MED ORDER — DILTIAZEM HCL-DEXTROSE 125-5 MG/125ML-% IV SOLN (PREMIX)
5.0000 mg/h | INTRAVENOUS | Status: DC
  Administered 2022-01-12: 5 mg/h via INTRAVENOUS
  Filled 2022-01-12: qty 125

## 2022-01-12 MED ORDER — POTASSIUM CHLORIDE CRYS ER 20 MEQ PO TBCR
40.0000 meq | EXTENDED_RELEASE_TABLET | Freq: Once | ORAL | Status: AC
  Administered 2022-01-12: 40 meq via ORAL
  Filled 2022-01-12: qty 2

## 2022-01-12 MED ORDER — MAGNESIUM OXIDE -MG SUPPLEMENT 400 (240 MG) MG PO TABS
400.0000 mg | ORAL_TABLET | Freq: Two times a day (BID) | ORAL | Status: DC
  Administered 2022-01-12 – 2022-01-13 (×4): 400 mg via ORAL
  Filled 2022-01-12 (×4): qty 1

## 2022-01-12 MED ORDER — POTASSIUM CHLORIDE 10 MEQ/100ML IV SOLN: 10.0000 meq | INTRAVENOUS | Status: DC

## 2022-01-12 NOTE — Progress Notes (Signed)
?PROGRESS NOTE ? ? ? Ryan [name]  DQQ:[id number] DOB: 02-08-1946 DOA: 01/11/2022 ?PCP: [name], MD  ?Chief Complaint  ?Patient presents with  ? Chest Pain  ? ? ?Brief Narrative:  ?Ryan [name] is Ryan [name] 76 y.o. male with medical history significant of prostate cancer on radiation therapy, CAD s/p CABG, COPD, atrial fibrillation on eliquis, hx lymphocytic colitis, and multiple other medical issues presenting with nausea, vomiting, diarrhea, and c/o pain.    ? ? ?Assessment & Plan: ?  ?Principal Problem: ?  Diarrhea ?Active Problems: ?  SIRS (systemic inflammatory response syndrome) (HCC) ?  Nausea, vomiting, and diarrhea ?  Atrial fibrillation with RVR ([place]) ?  Chest pain ?  Hypomagnesemia ?  Hypokalemia ?  Hypocalcemia ?  Essential hypertension ?  Hx of CABG ?  Prostate cancer ([place]) ?  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  COPD (chronic obstructive pulmonary disease) ([place]) ?  Macular degeneration (senile) of retina ?  Poor appetite ? ? ?Assessment and Plan: ?SIRS (systemic inflammatory response syndrome) (HCC) ?Tachycardia, tachypnea, rigors without clear source ?Developed fever overnight ?UA not c/w UTI ?CXR with possible interstitial edema ?CT with loops of fluid filled bowel (enteritis vs ileus), masslike thickening o stomach folds (lesion vs gastritis), bladder wall thickening  ?COVID testing negative, influenza testing negative ?Urine and blood cx pending ?c diff ?Broad spectrum abx after cultures collected, hopefully can narrow and d/c as able ? ?Nausea, vomiting, and diarrhea ?He notes symptoms typically have worsened with radiation and improved over the weekends, but not this time ?Notably hx microscopic colitis ?? Related to radiation ?CT as above notable for enteritis vs ileus and thickening of stomach folds concerning for underlying lesion vs gastritis ?c diff, fecal lactoferrin ?PPI BID ?Supportive care at this time with IVF, prn imodium ?Recommend discuss case with GI  (pending consult) and rad onc (noted could possibly have nausea associated with radiation - recommended holding radiation today, can resume tmrw if desired, otherwise discuss with Dr. Tammi Klippel Thursday) ? ?Atrial fibrillation with RVR ([place]) ?Likely related to dehydration with diarrhea, poor PO intake, etc ?(EF 50%, from 2022 echo) ?Continue PO metop if tolerated ?Continue eliquis  ? ? ?Hypocalcemia ?Replace and follow ? ?Hypokalemia ?Replace and follow ? ?Hypomagnesemia ?Replace and follow ? ?Chest pain ?Chronic, L sided, reproduced with palpation ?Troponin negative.  Not c/w acs.  EKG with afib, RBBB. ? ? 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 ?Prostate cancer ([place]) ?Followed by Dr. Eulogio Ditch and Dr. Tammi Klippel, currently undergoing radiation therapy ? ?Hx of CABG ?Aspirin, metop ?Hx statin intolerance, on repatha ? ?Essential hypertension ?Metop PO if tolerated ?Currently on diltiazem gtt ? ?COPD (chronic obstructive pulmonary disease) ([place]) ?CXR with fine intersitial opacity, possibly related to COPD, possible interstitial edema - appears euvolemic ?No COPD exacerbation ? ? ?DVT prophylaxis: eliquis ?Code Status: full ?Family Communication: none ?Disposition:  ? ?Status is: Inpatient ?Remains inpatient appropriate because: need for iIV abx ?  ?Consultants:  ?GI ?Rad onc ? ?Procedures:  ?none ? ?Antimicrobials:  ?Anti-infectives (From admission, onward)  ? ? Start     Dose/Rate Route Frequency Ordered Stop  ? 01/12/22 0330  ceFEPIme (MAXIPIME) 2 g in sodium chloride 0.9 % 100 mL IVPB       ? 2 g ?200 mL/hr over 30 Minutes Intravenous Every 8 hours 01/11/22 1849    ? 01/12/22 0000  ceFEPIme (MAXIPIME) 2 g  in sodium chloride 0.9 % 100 mL IVPB  Status:  Discontinued       ? 2 g ?200 mL/hr over 30 Minutes Intravenous Every 8 hours 01/11/22 1846 01/11/22 1849  ? 01/11/22 1900  vancomycin (VANCOREADY) IVPB 1500 mg/300 mL        ? 1,500 mg ?150 mL/hr over 120 Minutes Intravenous Every 24 hours 01/11/22 1846    ? 01/11/22 1830  ceFEPIme (MAXIPIME) 2 g in sodium chloride 0.9 % 100 mL IVPB       ? 2 g ?200 mL/hr over 30 Minutes Intravenous  Once 01/11/22 1825 01/11/22 1923  ? 01/11/22 1830  vancomycin (VANCOCIN) IVPB 1000 mg/200 mL premix  Status:  Discontinued       ? 1,000 mg ?200 mL/hr over 60 Minutes Intravenous  Once 01/11/22 1825 01/11/22 1845  ? ?  ? ? ?Subjective: ?Feels Ryan [name] little better ? ?Objective: ?Vitals:  ? 01/12/22 0103 01/12/22 0450 01/12/22 1305 01/12/22 1400  ?BP: 110/68 126/74  (!) 129/55  ?Pulse: 82 87  100  ?Resp: 20 20    ?Temp: 98.4 ?F (36.9 ?C) 100.2 ?F (37.9 ?C)    ?TempSrc: Oral Oral    ?SpO2: 95% 96% 93% 97%  ?Weight:      ?Height:      ? ? ?Intake/Output Summary (Last 24 hours) at 01/12/2022 1449 ?Last data filed at 01/12/2022 1300 ?Gross per 24 hour  ?Intake 2329.05 ml  ?Output 2 ml  ?Net 2327.05 ml  ? ?Filed Weights  ? 01/11/22 1841 01/11/22 2200  ?Weight: 73.5 kg 76.4 kg  ? ? ?Examination: ? ?General exam: uncomfortable, still shaking, but less ?Respiratory system: unlabored ?Cardiovascular system: RRR ?Gastrointestinal system: diffusely tender ?Central nervous system: Alert and oriented. No focal neurological deficits. ?Extremities: no lee ?Psychiatry: Judgement and insight appear normal. Mood & affect appropriate.  ? ? ? ?Data Reviewed: I have personally reviewed following labs and imaging studies ? ?CBC: ?Recent Labs  ?Lab 01/11/22 ?0959 01/12/22 ?7062  ?WBC 7.1 7.8  ?NEUTROABS 5.7  --   ?HGB 11.0* 9.6*  ?HCT 31.8* 28.8*  ?MCV 85.9 88.6  ?PLT 243 215  ? ? ?Basic Metabolic Panel: ?Recent Labs  ?Lab 01/11/22 ?0959 01/11/22 ?1141 01/11/22 ?2250 01/12/22 ?3762 01/12/22 ?1348  ?NA 136  --   --  136 135  ?K 2.6*  --   --  2.8* 4.2  ?CL 101  --   --  105 107  ?CO2 25  --   --  20* 17*  ?GLUCOSE 108*  --   --  87 79  ?BUN 17  --   --  14 14  ?CREATININE 0.78  --   --  0.88 0.91  ?CALCIUM 7.5*  --   --  7.3* 7.7*   ?MG  --  0.6* 1.0* 2.1 1.8  ?PHOS  --   --   --   --  2.8  ? ? ?GFR: ?Estimated Creatinine Clearance: 66.6 mL/min (by C-G formula based on SCr of 0.91 mg/dL). ? ?Liver Function Tests: ?Recent Labs  ?Lab 01/11/22 ?0959 01/12/22 ?8315  ?AST 20 22  ?ALT 13 13  ?ALKPHOS 55 49  ?BILITOT 0.6 0.9  ?PROT 7.1 6.0*  ?ALBUMIN 3.5 2.9*  ? ? ?CBG: ?No results for input(s): GLUCAP in the last 168 hours. ? ? ?Recent Results (from the past 240 hour(s))  ?Culture, blood (routine x 2)     Status: None (Preliminary result)  ? Collection Time: 01/11/22  6:40 PM  ?  Specimen: BLOOD  ?Result Value Ref Range Status  ? Specimen Description   Final  ?  BLOOD LEFT ANTECUBITAL ?Performed at [facility], [place] [address] [place], [place] [address] ?  ? Special Requests   Final  ?  BOTTLES DRAWN AEROBIC AND ANAEROBIC Blood Culture adequate volume ?Performed at [facility], [place] [address] [place], [place] [address] ?  ? Culture   Final  ?  NO GROWTH < 24 HOURS ?Performed at [facility], [place] [address], [place], [place] [address] ?  ? Report Status PENDING  Incomplete  ?Culture, blood (routine x 2)     Status: None (Preliminary result)  ? Collection Time: 01/11/22  6:40 PM  ? Specimen: BLOOD  ?Result Value Ref Range Status  ? Specimen Description   Final  ?  BLOOD RIGHT ANTECUBITAL ?Performed at [facility], [place] [address], [place], [place] [address] ?  ? Special Requests   Final  ?  BOTTLES DRAWN AEROBIC AND ANAEROBIC Blood Culture adequate volume ?Performed at [facility], [place] [address], [place], [place] [address] ?  ? Culture   Final  ?  NO GROWTH < 24 HOURS ?Performed at [facility], [place] [address] [place], [place] [address] ?  ? Report Status PENDING  Incomplete  ?Resp Panel by RT-PCR (Flu Fairley Copher&B, Covid) Nasopharyngeal Swab     Status: None  ? Collection Time: 01/11/22  6:41 PM  ? Specimen: Nasopharyngeal Swab; Nasopharyngeal(NP) swabs in vial  transport medium  ?Result Value Ref Range Status  ? SARS Coronavirus 2 by RT PCR NEGATIVE NEGATIVE Final  ?  Comment: (NOTE) ?SARS-CoV-2 target nucleic acids are NOT DETECTED. ? ?The SARS-CoV-2 RNA is generally

## 2022-01-12 NOTE — Hospital Course (Addendum)
Ryan [name] is a 76 y.o. male with medical history significant of prostate cancer on radiation therapy, CAD s/p CABG, COPD, atrial fibrillation on eliquis, hx lymphocytic colitis, and multiple other medical issues presenting with nausea, vomiting, diarrhea, and c/o pain.  Symptoms suspected to be related to his radiation therapy vs microscopic colitis.  CT abd/pelvis showed possible enteritis/ileus, masslike thickening of stomach folds, trace effusions and bladder thickening.  Also with fever, possible infection without identified source. Empiric antibiotics initiated for possible GI source. GI consulted and have started patient on budesonide with improvement of symptoms. ?

## 2022-01-12 NOTE — Consult Note (Addendum)
? ? ?Consultation ? ?Referring Provider: TRH/Powell MD ?Primary Care Physician:  [name], MD ?Primary Gastroenterologist:  Dr. Bryan Lemma ? ?Reason for Consultation: Diarrhea, abdominal pain, nausea vomiting, abnormal CT ? ?HPI: Ryan [name] is a 76 y.o. male, known to Dr. Bryan Lemma who was diagnosed with lymphocytic colitis in April 2022 when he had undergone EGD and colonoscopy. ?At EGD he was found to have multiple sessile gastric polyps which were fundic gland polyps and diffuse mildly congested mucosa of the gastric folds-biopsies unremarkable. ?At colonoscopy he had a 2 mm sigmoid polyp which path showed to be lymphoid aggregate and random colon biopsies were positive for lymphocytic colitis. ?It sounds as if he was initially treated with Imodium, then started on budesonide.  Patient says when he was taking 9 mg of budesonide daily he did have some improvement in his symptoms, but eventually he was weaned down on the dose and budesonide was stopped at some point last fall. ?He says he has continued to have a couple of loose bowel movements daily. ?He has multiple other comorbidities and underwent CABG in November 2022, also has history of congestive heart failure, COPD, atrial fibrillation for which he is on Eliquis. ?He was just recently diagnosed with prostate cancer and has been undergoing radiation over the past 4 to 5 weeks with goal to complete 40 sessions.  He has completed 18 thus far. ?Patient says he has been feeling terrible over the past 3 to 4 weeks since starting radiation.  He has no energy, no appetite, has been nauseated and has had very poor p.o. intake.  He started having some intermittent vomiting, no severe abdominal pain but has had some abdominal discomfort and bloating, and has had an increase in diarrhea which has been completely watery, larger volume and occurring 2-3 times per day at least.  He has also been having intermittent chills and low-grade fevers.  Tmax here  100.4.  Patient says he feels like the radiation is killing him. ?He was admitted through the emergency room yesterday. ?CT of the abdomen and pelvis showed a somewhat distended stomach and thickened rugal folds with focal masslike fullness in the gastric body. ?Colon was distended with fluid, scattered diverticulosis, no dilated small bowel.  Read as enteritis versus ileus. ? ?Labs yesterday WBC of 7.1, hemoglobin 11.0/hematocrit 31.8 ?Lactate within normal limits/potassium 2.6/BUN 17/creatinine 0.78 ?LFTs within normal limits ?Today WBC 7.8, hemoglobin 9.6/hematocrit 28.8 ?Cultures pending ?Differential quick screen negative ?Fecal lactoferrin pending. ? ? ? ?Past Medical History:  ?Diagnosis Date  ? Anemia   ? Anticoagulant long-term use   ? eliquis--- mananged by cardiology  ? BPH with urinary obstruction   ? CAD (coronary artery disease)   ? cardiologist--- dr g. taylor--- cath 04-22-2011  moderate LM stenosis, borderline sig pRCA, sig stenosis mid to distal LAD ;  aggressive medical therapy;   cath 07-21-2021 for chest pain w/ ST changes,  severe disease;  s/p CABG x4 07-22-2021  ? Chronic diastolic CHF (congestive heart failure) ([place]) 03/2018  ? followed by cardiology  ? CKD (chronic kidney disease), stage III ([place])   ? COPD (chronic obstructive pulmonary disease) ([place])   ? followed by pcp  ? DOE (dyspnea on exertion)   ? per pt when walk >100yds,  per pt rides outside bike 4-8 miles daily without sob,  household chores and yard work without sob  ? Eczema   ? GERD (gastroesophageal reflux disease)   ? Glaucoma, left eye   ? Heart murmur   ?  Hemorrhoids   ? History of adenomatous polyp of colon   ? History of coma 1962  ? 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 History of kidney stones   ? History of rheumatic fever as a child   ? History of syncope 02/2014  ? in setting AFlutter w/ RVR of known PAF admission in epic  ? History of transient ischemic attack (TIA) 03/23/2021  ?  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 History of urinary retention   ? Hypertension   ? Lumbar radiculopathy   ? per pt with left hip/ leg pain  ? Lymphocytic colitis   ? followed by dr v. Bryan Lemma--- dx by biopsy 01-28-2021  ? Macular degeneration of both eyes   ? followed by dr c. Cordelia Pen--- bilateral eye injection every 6 wks  ? Malignant neoplasm prostate [facility]) 07/2021  ? urologist-- dr Diona Fanti;  dx 10/ 2022,  Gleason 5+5, PSA 1.8  ? Osteoporosis 05/31/2016  ? PAF (paroxysmal atrial fibrillation) ([place]) 03/2012  ? cardiologist-- dr g. taylor;  first dx 06/ 2013 AFlutter w/ RVR  ? Vitamin D deficiency   ? ? ?Past Surgical History:  ?Procedure Laterality Date  ? ANTERIOR CERVICAL DECOMP/DISCECTOMY FUSION  06/08/2002  ? @[facility] ;  C5--C7  ? APPENDECTOMY  1953  ? CARDIAC CATHETERIZATION  04/22/2011  ? moderate left main and RCA stenosis not significant by FFR and IVUS on medical therapy  ? CARDIOVERSION N/A 04/25/2018  ? Procedure: CARDIOVERSION;  Surgeon: Skeet Latch, MD;  Location: [place];  Service: Cardiovascular;  Laterality: N/A;  ? CATARACT EXTRACTION W/ INTRAOCULAR LENS IMPLANT Bilateral 2021  ? COLONOSCOPY WITH ESOPHAGOGASTRODUODENOSCOPY (EGD)  01/28/2021  ? by [id number]  ? CORONARY ARTERY BYPASS GRAFT N/A 07/22/2021  ? Procedure: 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, ON PUMP, USING LEFT INTERNAL MAMMARY ARTERY AND ENDOSCOPICALLY HARVESTED RIGHT GREATER SAPHENOUS VEIN;  Surgeon: Melrose Nakayama, MD;  Location: [place];  Service: Open Heart Surgery;  Laterality: N/A;  ? ENDOVEIN HARVEST OF GREATER SAPHENOUS VEIN  07/22/2021  ? Procedure: ENDOVEIN HARVEST OF GREATER SAPHENOUS VEIN;  Surgeon: Melrose Nakayama, MD;  Location: [place];  Service: Open Heart Surgery;;  ? EXTRACORPOREAL SHOCK WAVE LITHOTRIPSY    ? x2  1990s  ? FOOT SURGERY Right 1970  ? calcification removed from top  of foot  ? GOLD SEED IMPLANT N/A 11/28/2021  ? Procedure: GOLD SEED IMPLANT;  Surgeon: Festus Aloe, MD;  Location: [facility];  Service: Urology;  Laterality: N/A;  ? KNEE ARTHROSCOPY Right 1991  ? LAPAROSCOPIC CHOLECYSTECTOMY  2000  ? LEFT HEART CATH AND CORONARY ANGIOGRAPHY N/A 07/21/2021  ? Procedure: LEFT HEART CATH AND CORONARY ANGIOGRAPHY;  Surgeon: Lorretta Harp, MD;  Location: [place] [facility];  Service: Cardiovascular;  Laterality: N/A;  ? SPACE OAR INSTILLATION N/A 11/28/2021  ? Procedure: SPACE OAR INSTILLATION;  Surgeon: Festus Aloe, MD;  Location: [facility];  Service: Urology;  Laterality: N/A;  ? TEE WITHOUT CARDIOVERSION N/A 07/22/2021  ? Procedure: TRANSESOPHAGEAL ECHOCARDIOGRAM (TEE);  Surgeon: Melrose Nakayama, MD;  Location: [place];  Service: Open Heart Surgery;  Laterality: N/A;  ? TOTAL KNEE ARTHROPLASTY Right 08/14/2009  ? @[facility]   ? TRANSURETHRAL RESECTION OF PROSTATE N/A 07/17/2021  ? Procedure: TRANSURETHRAL RESECTION OF THE PROSTATE (TURP);  Surgeon: [name],  MD;  Location: [place];  Service: Urology;  Laterality: N/A;  1 HR  ? ? ?Prior to Admission medications   ?Medication Sig Start Date End Date Taking? Authorizing Provider  ?apixaban (ELIQUIS) 5 MG TABS tablet Take 1 tablet (5 mg total) by mouth 2 (two) times daily. 11/30/21  Yes Festus Aloe, MD  ?aspirin EC 81 MG EC tablet Take 1 tablet (81 mg total) by mouth daily. Swallow whole. 08/01/21  Yes Roddenberry, Arlis Porta, PA-C  ?brimonidine (ALPHAGAN) 0.2 % ophthalmic solution Place 1 drop into the left eye in the morning and at bedtime.  09/10/17  Yes [provider]  ?clobetasol (TEMOVATE) 0.05 % external solution APPLY 1 APPLICATION TOPICALLY TWICE DAILY ?Patient taking differently: 2 (two) times daily as needed. APPLY 1 APPLICATION TOPICALLY TWICE DAILY 11/20/16  Yes [name], MD  ?Evolocumab (REPATHA SURECLICK) 034 MG/ML SOAJ Inject 140 mg  into the skin every 14 (fourteen) days. ?Patient taking differently: Inject 140 mg into the skin every 14 (fourteen) days. 09/23/21  Yes [name], MD  ?Ferrous Fumarate (HEMOCYTE) 324 (106 Fe) MG TABS tabl

## 2022-01-12 NOTE — Progress Notes (Signed)
?   01/12/22 2219  ?Assess: MEWS Score  ?BP (!) 122/54  ?ECG Heart Rate (!) 112  ?Resp (!) 32  ?Assess: MEWS Score  ?MEWS Temp 0  ?MEWS Systolic 0  ?MEWS Pulse 2  ?MEWS RR 2  ?MEWS LOC 0  ?MEWS Score 4  ?MEWS Score Color Red  ?Assess: if the MEWS score is Yellow or Red  ?Were vital signs taken at a resting state? Yes  ?Focused Assessment No change from prior assessment  ?Does the patient meet 2 or more of the SIRS criteria? Yes  ?Does the patient have a confirmed or suspected source of infection? Yes  ?Provider and Rapid Response Notified? Yes  ?MEWS guidelines implemented *See Row Information* Yes  ?Treat  ?MEWS Interventions Administered scheduled meds/treatments ?(cardizem gtt started)  ?Take Vital Signs  ?Increase Vital Sign Frequency  Red: Q 1hr X 4 then Q 4hr X 4, if remains red, continue Q 4hrs  ?Escalate  ?MEWS: Escalate Red: discuss with charge nurse/RN and provider, consider discussing with RRT ?(MD already previously made aware of HR)  ?Notify: Charge Nurse/RN  ?Name of Charge Nurse/RN Notified Pam  ?Date Charge Nurse/RN Notified 01/12/22  ?Time Charge Nurse/RN Notified 2043  ?Notify: Provider  ?Provider Name/Title Florene Glen  ?Date Provider Notified 01/12/22  ?Provider response  ?(cardizem gtt)  ?Date of Provider Response 01/12/22  ?Document  ?Patient Outcome Other (Comment) ?(Cardizem gtt just started)  ?Assess: SIRS CRITERIA  ?SIRS Temperature  0  ?SIRS Pulse 1  ?SIRS Respirations  1  ?SIRS WBC 0  ?SIRS Score Sum  2  ? ? ?

## 2022-01-12 NOTE — Assessment & Plan Note (Addendum)
Improved

## 2022-01-12 NOTE — TOC Initial Note (Addendum)
Transition of Care (TOC) - Initial/Assessment Note  ? ? ?Patient Details  ?Name: Ryan [name] ?MRN: [id number] ?Date of Birth: Feb 18, 1946 ? ?Transition of Care (TOC) CM/SW Contact:    ?Tawanna Cooler, RN ?Phone Number: ?01/12/2022, 8:36 AM ? ?Clinical Narrative:                 ? ?Patient from home with spouse.  On radiation therapy for prostate cancer.  Has a rollator. ?Previously on service with [facility] for PT. ? ?TOC following for discharge needs.  ? ?Expected Discharge Plan: Home/Self Care ?Barriers to Discharge: Continued Medical Work up ? ?Expected Discharge Plan and Services ?Expected Discharge Plan: Home/Self Care ?  ?  ?Living arrangements for the past 2 months: [place] ?                ?  ?Prior Living Arrangements/Services ?Living arrangements for the past 2 months: [place] ?Lives with:: Spouse ?Patient language and need for interpreter reviewed:: Yes ?       ?Need for Family Participation in Patient Care: Yes (Comment) ?Care giver support system in place?: Yes (comment) ?  ?Criminal Activity/Legal Involvement Pertinent to Current Situation/Hospitalization: No - Comment as needed ? ?Activities of Daily Living ?Home Assistive Devices/Equipment: Cane (specify quad or straight) ?ADL Screening (condition at time of admission) ?Patient's cognitive ability adequate to safely complete daily activities?: Yes ?Is the patient deaf or have difficulty hearing?: No ?Does the patient have difficulty seeing, even when wearing glasses/contacts?: No ?Does the patient have difficulty concentrating, remembering, or making decisions?: No ?Patient able to express need for assistance with ADLs?: Yes ?Does the patient have difficulty dressing or bathing?: No ?Independently performs ADLs?: Yes (appropriate for developmental age) ?Does the patient have difficulty walking or climbing stairs?: No ?Weakness of Legs: None ?Weakness of Arms/Hands: None ? ?Emotional Assessment ?  ?Orientation: : Oriented to  Self, Oriented to Place, Oriented to  Time, Oriented to Situation ?Alcohol / Substance Use: Not Applicable ?Psych Involvement: No (comment) ? ?Admission diagnosis:  Diarrhea [R19.7] ?Hypokalemia [E87.6] ?Colitis [K52.9] ?Vomiting and diarrhea [R11.10, R19.7] ?Diffuse abdominal pain [R10.84] ?Patient Active Problem List  ? Diagnosis Date Noted  ? SIRS (systemic inflammatory response syndrome) ([place]) 01/11/2022  ? Atrial fibrillation with RVR ([place]) 01/11/2022  ? 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 Poor appetite 01/11/2022  ? Prostate cancer ([place]) 09/02/2021  ? Hx of CABG 07/22/2021  ? Urinary tract infection associated with indwelling urethral catheter ([place])   ? CAD (coronary artery disease) 07/21/2021  ? Hematuria 07/20/2021  ? BPH with urinary obstruction 07/17/2021  ? CVA (cerebral vascular accident) ([place]) 03/23/2021  ? Stroke-like symptoms   ? Urinary frequency 03/04/2021  ? Hx of colonic polyps 11/24/2020  ? Diarrhea 11/24/2020  ? Dyspnea 04/20/2020  ? Anxiety 04/20/2020  ? 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 Elevated troponin 04/09/2020  ? Nausea, vomiting, and diarrhea 04/09/2020  ? Hypomagnesemia 04/09/2020  ? Hypokalemia 04/09/2020  ? Acute urinary retention 04/09/2020  ? Statin intolerance 12/19/2019  ? Vitamin D deficiency 09/18/2019  ? CHF (congestive heart failure) ([place]) 03/22/2018  ? PAF (paroxysmal atrial fibrillation) ([place]) 03/22/2018  ? Edema 03/22/2018  ? H/O: rheumatic fever 12/27/2017  ? Hyperglycemia 06/21/2017  ? Kidney stone 06/21/2017  ? 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 Osteoporosis 05/31/2016  ? BPH (benign prostatic hyperplasia) 01/20/2015  ? Erectile dysfunction 01/20/2015  ? Rectal bleeding 01/20/2015  ?  Preventative health care 01/20/2015  ? Eczema 08/03/2014  ? Sleep apnea 03/01/2014  ? Anticoagulation adequate with anticoagulant therapy 03/01/2014  ? Pre-syncope 02/26/2014  ? Abdominal pain 02/26/2014  ? PAD  (peripheral artery disease) ([place]) 02/26/2014  ? Constipation 03/06/2013  ? Easy bruising 03/06/2013  ? History of alcohol abuse 04/15/2012  ? CAD (coronary atherosclerotic disease) 05/08/2011  ? Chest pain 03/10/2011  ? LUMBAR RADICULOPATHY, 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  ? 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  ? UNS ADVRS EFF UNS RX MEDICINAL&BIOLOGICAL SBSTNC 11/11/2009  ? H/O tobacco use, presenting hazards to health 07/29/2009  ? COPD (chronic obstructive pulmonary disease) ([place]) 07/29/2009  ? Hyperlipidemia, mixed 06/20/2007  ? Macular degeneration (senile) of retina 06/20/2007  ? Essential hypertension 06/20/2007  ? GERD 06/16/2007  ? ?PCP:  [name], MD ?Pharmacy:   ?[id number] - [place], [place] [address] [place] ?[place] ?[place] [address] ?Phone: [phone] Fax: [phone] ? ?[facility] ([place] [place] ?[place] ?[address] ?[place] [address] ?Phone: [phone] Fax: [phone] ? ?[facility] ([facility] ) - [place] ?[address] ?[place] [address] ?Phone: [phone] Fax: [phone] ? ? ?Readmission Risk Interventions ? ?  01/12/2022  ?  8:35 AM 08/01/2021  ? 10:18 AM  ?Readmission Risk Prevention Plan  ?Transportation Screening Complete Complete  ?PCP or Specialist Appt within 3-5 Days Complete Complete  ?[place] or Home Care Consult Complete Complete  ?Social Work Consult for [place] Planning/Counseling  Complete  ?Palliative Care Screening Not Applicable Not Applicable  ?Medication Review Press photographer)  Complete  ? ? ? ?

## 2022-01-13 ENCOUNTER — Ambulatory Visit: Payer: Medicare Other

## 2022-01-13 ENCOUNTER — Inpatient Hospital Stay (HOSPITAL_COMMUNITY): Payer: Medicare Other

## 2022-01-13 DIAGNOSIS — R6510 Systemic inflammatory response syndrome (SIRS) of non-infectious origin without acute organ dysfunction: Secondary | ICD-10-CM

## 2022-01-13 DIAGNOSIS — C61 Malignant neoplasm of prostate: Secondary | ICD-10-CM | POA: Diagnosis not present

## 2022-01-13 DIAGNOSIS — R197 Diarrhea, unspecified: Secondary | ICD-10-CM | POA: Diagnosis not present

## 2022-01-13 DIAGNOSIS — R630 Anorexia: Secondary | ICD-10-CM | POA: Diagnosis not present

## 2022-01-13 DIAGNOSIS — I4891 Unspecified atrial fibrillation: Secondary | ICD-10-CM | POA: Diagnosis not present

## 2022-01-13 DIAGNOSIS — R112 Nausea with vomiting, unspecified: Secondary | ICD-10-CM | POA: Diagnosis not present

## 2022-01-13 LAB — URINE CULTURE: Culture: NO GROWTH

## 2022-01-13 LAB — COMPREHENSIVE METABOLIC PANEL
ALT: 14 U/L (ref 0–44)
AST: 24 U/L (ref 15–41)
Albumin: 2.7 g/dL — ABNORMAL LOW (ref 3.5–5.0)
Alkaline Phosphatase: 48 U/L (ref 38–126)
Anion gap: 7 (ref 5–15)
BUN: 15 mg/dL (ref 8–23)
CO2: 18 mmol/L — ABNORMAL LOW (ref 22–32)
Calcium: 8.3 mg/dL — ABNORMAL LOW (ref 8.9–10.3)
Chloride: 109 mmol/L (ref 98–111)
Creatinine, Ser: 0.94 mg/dL (ref 0.61–1.24)
GFR, Estimated: >60 mL/min (ref >60)
Glucose, Bld: 101 mg/dL — ABNORMAL HIGH (ref 70–99)
Potassium: 4.6 mmol/L (ref 3.5–5.1)
Sodium: 134 mmol/L — ABNORMAL LOW (ref 135–145)
Total Bilirubin: 1.2 mg/dL (ref 0.3–1.2)
Total Protein: 5.8 g/dL — ABNORMAL LOW (ref 6.5–8.1)

## 2022-01-13 LAB — ECHOCARDIOGRAM COMPLETE
AR max vel: 1.95 cm2
AV Peak grad: 12.3 mmHg
Ao pk vel: 1.75 m/s
Area-P 1/2: 4.93 cm2
Calc EF: 62.4 %
Height: 64 in
MV M vel: 4.78 m/s
MV Peak grad: 91.2 mmHg
S' Lateral: 3.4 cm
Single Plane A2C EF: 57.1 %
Single Plane A4C EF: 65.1 %
Weight: 2701.96 oz

## 2022-01-13 LAB — CBC WITH DIFFERENTIAL/PLATELET
Abs Immature Granulocytes: 0.09 10*3/uL — ABNORMAL HIGH (ref 0.00–0.07)
Basophils Absolute: 0 10*3/uL (ref 0.0–0.1)
Basophils Relative: 0 %
Eosinophils Absolute: 0.1 10*3/uL (ref 0.0–0.5)
Eosinophils Relative: 2 %
HCT: 27.2 % — ABNORMAL LOW (ref 39.0–52.0)
Hemoglobin: 9 g/dL — ABNORMAL LOW (ref 13.0–17.0)
Immature Granulocytes: 1 %
Lymphocytes Relative: 7 %
Lymphs Abs: 0.5 10*3/uL — ABNORMAL LOW (ref 0.7–4.0)
MCH: 29.6 pg (ref 26.0–34.0)
MCHC: 33.1 g/dL (ref 30.0–36.0)
MCV: 89.5 fL (ref 80.0–100.0)
Monocytes Absolute: 0.9 10*3/uL (ref 0.1–1.0)
Monocytes Relative: 12 %
Neutro Abs: 6.2 10*3/uL (ref 1.7–7.7)
Neutrophils Relative %: 78 %
Platelets: 223 10*3/uL (ref 150–400)
RBC: 3.04 MIL/uL — ABNORMAL LOW (ref 4.22–5.81)
RDW: 14.3 % (ref 11.5–15.5)
WBC: 7.9 10*3/uL (ref 4.0–10.5)
nRBC: 0 % (ref 0.0–0.2)

## 2022-01-13 LAB — LACTOFERRIN, FECAL, QUALITATIVE: Lactoferrin, Fecal, Qual: POSITIVE — AB

## 2022-01-13 LAB — PHOSPHORUS: Phosphorus: 2.3 mg/dL — ABNORMAL LOW (ref 2.5–4.6)

## 2022-01-13 LAB — MAGNESIUM: Magnesium: 1.7 mg/dL (ref 1.7–2.4)

## 2022-01-13 MED ORDER — BUDESONIDE 3 MG PO CPEP
9.0000 mg | ORAL_CAPSULE | Freq: Every day | ORAL | Status: DC
  Administered 2022-01-13 – 2022-01-15 (×3): 9 mg via ORAL
  Filled 2022-01-13 (×4): qty 3

## 2022-01-13 MED ORDER — SODIUM CHLORIDE 0.9 % IV SOLN
3.0000 g | Freq: Four times a day (QID) | INTRAVENOUS | Status: DC
  Administered 2022-01-13 – 2022-01-15 (×9): 3 g via INTRAVENOUS
  Filled 2022-01-13 (×11): qty 8

## 2022-01-13 NOTE — Progress Notes (Signed)
?PROGRESS NOTE ? ? ? Ryan [name]  LGX:[id number] DOB: 1946/08/20 DOA: 01/11/2022 ?PCP: Ryan Lukes, MD  ?Chief Complaint  ?Patient presents with  ? Chest Pain  ? ? ?Brief Narrative:  ?Ryan [name] is Ryan [name] 76 y.o. male with medical history significant of prostate cancer on radiation therapy, CAD s/p CABG, COPD, atrial fibrillation on eliquis, hx lymphocytic colitis, and multiple other medical issues presenting with nausea, vomiting, diarrhea, and c/o pain.  Symptoms suspected to be related to his radiation therapy.  CT abd/pelvis showed possible enteritis/ileus, masslike thickening of stomach folds, trace effusions and bladder thickening.  Also with fever, possible infection without identified source, he's on antibiotics.  His radiation therapy currently on hold, improving with hydration, IV abx, holding radiation.  GI consulted.  Plan to address with Dr. Tammi [name] on Thursday. ? ?See below for additional details  ? ? ?Assessment & Plan: ?  ?Principal Problem: ?  Diarrhea ?Active Problems: ?  SIRS (systemic inflammatory response syndrome) (HCC) ?  Nausea, vomiting, and diarrhea ?  Atrial fibrillation with RVR ([place]) ?  Hypomagnesemia ?  Hypokalemia ?  Hypocalcemia ?  Essential hypertension ?  Hx of CABG ?  Prostate cancer ([place]) ?  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  COPD (chronic obstructive pulmonary disease) ([place]) ?  Chest pain ?  Macular degeneration (senile) of retina ?  Poor appetite ? ? ?Assessment and Plan: ?SIRS (systemic inflammatory response syndrome) (HCC) ?Tachycardia, tachypnea, rigors without clear source ?Developed fever overnight 4/9-4/10 ?UA not c/w UTI ?CXR with possible interstitial edema ?CT with loops of fluid filled bowel (enteritis vs ileus), masslike thickening o stomach folds (lesion vs gastritis), bladder wall thickening  ?COVID testing negative, influenza testing negative ?Urine and blood cx NGTD ?c diff negative.  GI path panel pending ?Will narrow abx to  unasyn today, no positive culture results yet.  Will plan for 7 day course unless further culture data/workup clarifies cause for fever. ? ?Nausea, vomiting, and diarrhea ?He notes symptoms typically have worsened with radiation and improved over the weekends, but not this time ?Notably hx microscopic colitis ?? Related to radiation ?CT as above notable for enteritis vs ileus and thickening of stomach folds concerning for underlying lesion vs gastritis -> GI suspects related to known GI polyposis previously evaluated ?c diff negative, fecal lactoferrin pending, GI path panel pending ?PPI BID ?Supportive care at this time with IVF, prn imodium ?GI c/s, appreciate recs - GI path panel pending, considering resumption of budesonide ?rad onc - will hold radiation at this point based on patient's symptoms, plan for discussion with Dr. Tammi [name] on Thursday (discussed with Dr. Isidore [name] on 4/10, noted his radiation could contribute to his nausea, noted would hold radiation 4/10, but could resume on 4/11 depending on how he feels) ? ?Atrial fibrillation with RVR ([place]) ?Likely related to dehydration with diarrhea, poor PO intake, etc ?(EF 50%, from 2022 echo) ?Continue PO metop if tolerated ?Has intermittently required dilt gtt, will d/c at this time with improvement in rate and soft pressures.  BP Ryan [name] little soft to increase metop dose, follow rates. ?Continue eliquis  ? ? ?Hypocalcemia ?improved ? ?Hypokalemia ?resolved ? ?Hypomagnesemia ?Resolved, continue oral replacement ? 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 ?Prostate cancer ([place]) ?Followed by Dr. Eulogio [name] and Dr. Tammi [name], currently undergoing radiation therapy ? ?Hx of CABG ?Aspirin, metop ?Hx statin intolerance, on repatha ? ?  Essential hypertension ?Metop PO if tolerated ? ? ?COPD (chronic obstructive pulmonary disease) ([place]) ?CXR with fine intersitial  opacity, possibly related to COPD, possible interstitial edema - appears euvolemic ?No COPD exacerbation ?Wean o2 as tolerated ? ?Chest pain ?Chronic, L sided, reproduced with palpation ?Troponin negative.  Not c/w acs.  EKG with afib, RBBB. ? ? ? ?DVT prophylaxis: eliquis ?Code Status: full ?Family Communication: none ?Disposition:  ? ?Status is: Inpatient ?Remains inpatient appropriate because: need for iIV abx ?  ?Consultants:  ?GI ?Rad onc ? ?Procedures:  ?none ? ?Antimicrobials:  ?Anti-infectives (From admission, onward)  ? ? Start     Dose/Rate Route Frequency Ordered Stop  ? 01/13/22 1200  Ampicillin-Sulbactam (UNASYN) 3 g in sodium chloride 0.9 % 100 mL IVPB       ? 3 g ?200 mL/hr over 30 Minutes Intravenous Every 6 hours 01/13/22 1053    ? 01/12/22 0330  ceFEPIme (MAXIPIME) 2 g in sodium chloride 0.9 % 100 mL IVPB  Status:  Discontinued       ? 2 g ?200 mL/hr over 30 Minutes Intravenous Every 8 hours 01/11/22 1849 01/13/22 1052  ? 01/12/22 0000  ceFEPIme (MAXIPIME) 2 g in sodium chloride 0.9 % 100 mL IVPB  Status:  Discontinued       ? 2 g ?200 mL/hr over 30 Minutes Intravenous Every 8 hours 01/11/22 1846 01/11/22 1849  ? 01/11/22 1900  vancomycin (VANCOREADY) IVPB 1500 mg/300 mL  Status:  Discontinued       ? 1,500 mg ?150 mL/hr over 120 Minutes Intravenous Every 24 hours 01/11/22 1846 01/13/22 1052  ? 01/11/22 1830  ceFEPIme (MAXIPIME) 2 g in sodium chloride 0.9 % 100 mL IVPB       ? 2 g ?200 mL/hr over 30 Minutes Intravenous  Once 01/11/22 1825 01/11/22 1923  ? 01/11/22 1830  vancomycin (VANCOCIN) IVPB 1000 mg/200 mL premix  Status:  Discontinued       ? 1,000 mg ?200 mL/hr over 60 Minutes Intravenous  Once 01/11/22 1825 01/11/22 1845  ? ?  ? ? ?Subjective: ?Maybe Ryan [name] little better ?Feels ok to advance diet ? ?Objective: ?Vitals:  ? 01/13/22 1200 01/13/22 1220 01/13/22 1224 01/13/22 1300  ?BP: (!) 102/58   104/84  ?Pulse: 92   87  ?Resp: 19   (!) 21  ?Temp:      ?TempSrc:      ?SpO2: 90% (!) 88% 92% 94%   ?Weight:      ?Height:      ? ? ?Intake/Output Summary (Last 24 hours) at 01/13/2022 1332 ?Last data filed at 01/13/2022 [phone] ?Gross per 24 hour  ?Intake 701.49 ml  ?Output 200 ml  ?Net 501.49 ml  ? ?Filed Weights  ? 01/11/22 1841 01/11/22 2200 01/13/22 0337  ?Weight: 73.5 kg 76.4 kg 76.6 kg  ? ? ?Examination: ? ?General: No acute distress. ?Cardiovascular: irregularly irregular, rate to 100's at times ?Lungs: unlabored on 2 L ?Abdomen: improved, but still diffuse mild tenderness ?Neurological: Alert and oriented ?3. Moves all extremities ?4. Cranial nerves II through XII grossly intact. ?Skin: Warm and dry. No rashes or lesions. ?Extremities: No clubbing or cyanosis. No edema.  ? ?Data Reviewed: I have personally reviewed following labs and imaging studies ? ?CBC: ?Recent Labs  ?Lab 01/11/22 ?0959 01/12/22 ?3545 01/13/22 ?0352  ?WBC 7.1 7.8 7.9  ?NEUTROABS 5.7  --  6.2  ?HGB 11.0* 9.6* 9.0*  ?HCT 31.8* 28.8* 27.2*  ?MCV 85.9 88.6 89.5  ?PLT 243  215 223  ? ? ?Basic Metabolic Panel: ?Recent Labs  ?Lab 01/11/22 ?0959 01/11/22 ?1141 01/11/22 ?2250 01/12/22 ?7782 01/12/22 ?1348 01/13/22 ?0352  ?NA 136  --   --  136 135 134*  ?K 2.6*  --   --  2.8* 4.2 4.6  ?CL 101  --   --  105 107 109  ?CO2 25  --   --  20* 17* 18*  ?GLUCOSE 108*  --   --  87 79 101*  ?BUN 17  --   --  14 14 15   ?CREATININE 0.78  --   --  0.88 0.91 0.94  ?CALCIUM 7.5*  --   --  7.3* 7.7* 8.3*  ?MG  --  0.6* 1.0* 2.1 1.8 1.7  ?PHOS  --   --   --   --  2.8 2.3*  ? ? ?GFR: ?Estimated Creatinine Clearance: 63.6 mL/min (by C-G formula based on SCr of 0.94 mg/dL). ? ?Liver Function Tests: ?Recent Labs  ?Lab 01/11/22 ?0959 01/12/22 ?4235 01/13/22 ?0352  ?AST 20 22 24   ?ALT 13 13 14   ?ALKPHOS 55 49 48  ?BILITOT 0.6 0.9 1.2  ?PROT 7.1 6.0* 5.8*  ?ALBUMIN 3.5 2.9* 2.7*  ? ? ?CBG: ?No results for input(s): GLUCAP in the last 168 hours. ? ? ?Recent Results (from the past 240 hour(s))  ?Urine Culture     Status: None  ? Collection Time: 01/11/22  2:45 PM  ?  Specimen: Urine, Clean Catch  ?Result Value Ref Range Status  ? Specimen Description   Final  ?  URINE, CLEAN CATCH ?Performed at [facility], [place] [address], [place], [place] [address] ?  ? Special

## 2022-01-13 NOTE — Progress Notes (Addendum)
Patient ID: Ryan [name], male   DOB: 09/10/1946, 76 y.o.   MRN: [id number] ? ? ? Progress Note ? ? Subjective  ? Day # 2 ?CC; diarrhea, nausea vomiting, abdominal pain-SIRS ? ?Elevated MEWS score last p.m.-tachycardic 112/respirations 32 ? ?Maxipime IV ? ?CRP 16.6 ?GI path panel pending ?WBC 7.9/hemoglobin 9.0/hematocrit 27.2 ?Sodium 134/potassium 4.6/BUN 15/creatinine 0.94 ?LFTs normal ? ?Blood cultures negative so far ? ?Today is patient's birthday-he says he feels a little bit better than yesterday overall, somewhat stronger.  No current nausea or vomiting and was able to take some full liquids this morning.  No significant abdominal pain today and feels less bloated and distended, 2 small diarrheal stools today. ? ? Objective  ? ?Vital signs in last 24 hours: ?Temp:  [98.1 ?F (36.7 ?C)-100 ?F (37.8 ?C)] 98.1 ?F (36.7 ?C) (04/11 0400) ?Pulse Rate:  [58-119] 94 (04/11 0400) ?Resp:  [17-34] 33 (04/11 0900) ?BP: (103-144)/(51-81) 129/60 (04/11 0900) ?SpO2:  [90 %-100 %] 100 % (04/11 0400) ?Weight:  [76.6 kg] 76.6 kg (04/11 0337) ?Last BM Date : 01/12/22 ?General:    Elderly white male in NAD, on nasal O2 ?Heart:  irRegular rate and rhythm; no murmurs ?Lungs: Respirations even and unlabored, lungs CTA bilaterally ?Abdomen:  Soft, nondistended.,  No focal tenderness, normal bowel sounds. ?Extremities:  Without edema. ?Neurologic:  Alert and oriented,  grossly normal neurologically. ?Psych:  Cooperative. Normal mood and affect. ? ?Intake/Output from previous day: ?04/10 0701 - 04/11 0700 ?In: 1669.5 [P.O.:300; I.V.:489.7; IV Piggyback:879.7] ?Out: 200 [Urine:200] ?Intake/Output this shift: ?No intake/output data recorded. ? ?Lab Results: ?Recent Labs  ?  01/11/22 ?0959 01/12/22 ?4034 01/13/22 ?0352  ?WBC 7.1 7.8 7.9  ?HGB 11.0* 9.6* 9.0*  ?HCT 31.8* 28.8* 27.2*  ?PLT 243 215 223  ? ?BMET ?Recent Labs  ?  01/12/22 ?7425 01/12/22 ?1348 01/13/22 ?0352  ?NA 136 135 134*  ?K 2.8* 4.2 4.6  ?CL 105 107 109  ?CO2 20* 17* 18*   ?GLUCOSE 87 79 101*  ?BUN 14 14 15   ?CREATININE 0.88 0.91 0.94  ?CALCIUM 7.3* 7.7* 8.3*  ? ?LFT ?Recent Labs  ?  01/13/22 ?0352  ?PROT 5.8*  ?ALBUMIN 2.7*  ?AST 24  ?ALT 14  ?ALKPHOS 48  ?BILITOT 1.2  ? ?PT/INR ?No results for input(s): LABPROT, INR in the last 72 hours. ? ?Studies/Results: ?CT ABDOMEN PELVIS W CONTRAST ? ?Result Date: 01/11/2022 ?CLINICAL DATA:  Abdominal pain, acute, nonlocalized EXAM: CT ABDOMEN AND PELVIS WITH CONTRAST TECHNIQUE: Multidetector CT imaging of the abdomen and pelvis was performed using the standard protocol following bolus administration of intravenous contrast. RADIATION DOSE REDUCTION: This exam was performed according to the departmental dose-optimization program which includes automated exposure control, adjustment of the mA and/or kV according to patient size and/or use of iterative reconstruction technique. CONTRAST:  122mL OMNIPAQUE IOHEXOL 300 MG/ML  SOLN COMPARISON:  CT dated April 09, 2020. PET-CT dated August 15, 2021 FINDINGS: Lower chest: Emphysematous changes. Trace pleural effusions. Subpleural reticulation likely reflecting a degree of underlying pulmonary fibrosis, similar in comparison to prior. Cardiomegaly. Hepatobiliary: Hepatic steatosis. Status post cholecystectomy. Unchanged appearance of the common bile duct. Portal vein is patent. Pancreas: Punctate calcifications of the pancreatic head likely reflecting sequela of remote prior pancreatitis. No acute peripancreatic fat stranding. Spleen: Unremarkable. Adrenals/Urinary Tract: Adrenal glands are unremarkable. No hydronephrosis. Kidneys enhance symmetrically. Punctate nonobstructive RIGHT-sided nephrolithiasis. No obstructing nephrolithiasis. Mild circumferential wall thickening of the bladder, likely reactive. Stomach/Bowel: There is distension of the stomach with  contrast material. There is thickening of the rugal folds with more focal masslike fullness of the anterolateral aspect of the gastric body (series  4, image 34; series 2, image 18). Colon is distended with fluid throughout its course scattered diverticulosis without evidence of acute diverticulitis. No frankly dilated loops of bowel. Enteric contrast has progressed to the cecum. Status post appendectomy. Vascular/Lymphatic: Atherosclerotic calcifications of the nonaneurysmal aorta. No new suspicious lymphadenopathy. Reproductive: Brachytherapy seeds. High density tracking posterior to the prostate is consistent with history of space Oar instillation. Other: No free air. Superficial fat stranding in the anterior upper abdomen. Musculoskeletal: No acute or significant osseous findings. IMPRESSION: 1. Loops of bowel are fluid-filled without frank dilation. Findings are nonspecific and could reflect nonspecific enteritis or early ileus. 2. Revisualization of masslike thickening of the stomach folds. This could reflect underlying lesion or gastritis. Recommend correlation with endoscopy if not previously performed. 3. Trace bilateral pleural effusions. 4. Mild circumferential wall thickening of the bladder, likely sequela of chronic outlet obstruction versus nonspecific cystitis. Aortic Atherosclerosis (ICD10-I70.0). Electronically Signed   By: Valentino Saxon M.D.   On: 01/11/2022 16:29  ? ?DG Chest Port 1 View ? ?Result Date: 01/11/2022 ?CLINICAL DATA:  Chest pain EXAM: PORTABLE CHEST 1 VIEW COMPARISON:  08/26/2021 FINDINGS: Diffuse fine interstitial opacity. Generous lung volumes. Mild cardiac enlargement. Prior CABG. Extensive artifact from EKG leads. IMPRESSION: Diffuse fine interstitial likely related to the patient's COPD, although more prominent than on a 2022 comparison. Interstitial edema may be superimposed. Electronically Signed   By: Jorje Guild M.D.   On: 01/11/2022 10:11   ? ? ? ? Assessment / Plan:   ? ?#5   76 year old white male with history of lymphocytic colitis, has not been on any medication over the past few months, currently undergoing  radiation for prostate cancer who was admitted with nausea, intermittent vomiting, chills, abdominal pain poor oral intake and worsening diarrhea. ? ?Patient meets SIRS criteria ? ?Feeling better today ? ?Imaging suggestive of a mild ileus/enteritis ? ?Acute symptoms are unlikely secondary to lymphocytic colitis and suspect other infectious etiology. ? ?Blood cultures negative thus far, GI path panel collected earlier this morning and pending, C. difficile quick screen negative. ? ?On Maxipime ? ?#2 coronary artery disease status post CABG 1122 ?#3 A-fib-on Eliquis-on Cardizem drip for rate control ?4 COPD ?#5 congestive heart failure ?6.  Hypokalemia corrected ?#7 abnormal stomach on CT with fullness in the gastric body-GD 1 year ago with multiple sessile gastric polyps and some congested mucosa in the gastric fundus with negative biopsies ? ?Plan; continue full liquids, advance as tolerated ?Continue broad-spectrum antibiotics for now ?Await GI path panel ?Continue supportive management ?GI will continue to follow with you ? ? ? ? ? ? ?Principal Problem: ?  Diarrhea ?Active Problems: ?  Macular degeneration (senile) of retina ?  Essential hypertension ?  COPD (chronic obstructive pulmonary disease) ([place]) ?  Chest pain ?  Nausea, vomiting, and diarrhea ?  Hypomagnesemia ?  Hypokalemia ?  Hx of CABG ?  Prostate cancer ([place]) ?  SIRS (systemic inflammatory response syndrome) (HCC) ?  Atrial fibrillation with RVR ([place]) ?  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  Poor appetite ?  Hypocalcemia ? ? ? ? LOS: 2 days  ? ?Amy Esterwood PA-C 01/13/2022, 9:14 AM ? ?GI ATTENDING ? ?Interval history data reviewed.  Agree with interval progress note as outlined above.  Patient now hydrated with electrolytes corrected.  Agree with advancing  diet and seeing how he does.  Continue antibiotics.  GI pathogen panel pending.  We will continue to follow. ? ?Docia Chuck. Geri [place]in[place]., M.D. ?[facility] ?Division of  Gastroenterology  ? ? ?  ?

## 2022-01-14 ENCOUNTER — Telehealth: Payer: Self-pay

## 2022-01-14 ENCOUNTER — Ambulatory Visit: Payer: Medicare Other

## 2022-01-14 DIAGNOSIS — J449 Chronic obstructive pulmonary disease, unspecified: Secondary | ICD-10-CM

## 2022-01-14 DIAGNOSIS — E876 Hypokalemia: Secondary | ICD-10-CM | POA: Diagnosis not present

## 2022-01-14 DIAGNOSIS — I10 Essential (primary) hypertension: Secondary | ICD-10-CM

## 2022-01-14 DIAGNOSIS — K52832 Lymphocytic colitis: Secondary | ICD-10-CM | POA: Diagnosis not present

## 2022-01-14 DIAGNOSIS — I5022 Chronic systolic (congestive) heart failure: Secondary | ICD-10-CM

## 2022-01-14 DIAGNOSIS — R197 Diarrhea, unspecified: Secondary | ICD-10-CM | POA: Diagnosis not present

## 2022-01-14 DIAGNOSIS — I4891 Unspecified atrial fibrillation: Secondary | ICD-10-CM | POA: Diagnosis not present

## 2022-01-14 LAB — CBC WITH DIFFERENTIAL/PLATELET
Abs Immature Granulocytes: 0.1 10*3/uL — ABNORMAL HIGH (ref 0.00–0.07)
Basophils Absolute: 0 10*3/uL (ref 0.0–0.1)
Basophils Relative: 1 %
Eosinophils Absolute: 0 10*3/uL (ref 0.0–0.5)
Eosinophils Relative: 0 %
HCT: 28.3 % — ABNORMAL LOW (ref 39.0–52.0)
Hemoglobin: 9.7 g/dL — ABNORMAL LOW (ref 13.0–17.0)
Immature Granulocytes: 2 %
Lymphocytes Relative: 6 %
Lymphs Abs: 0.4 10*3/uL — ABNORMAL LOW (ref 0.7–4.0)
MCH: 29.3 pg (ref 26.0–34.0)
MCHC: 34.3 g/dL (ref 30.0–36.0)
MCV: 85.5 fL (ref 80.0–100.0)
Monocytes Absolute: 0.5 10*3/uL (ref 0.1–1.0)
Monocytes Relative: 7 %
Neutro Abs: 5.5 10*3/uL (ref 1.7–7.7)
Neutrophils Relative %: 84 %
Platelets: 243 10*3/uL (ref 150–400)
RBC: 3.31 MIL/uL — ABNORMAL LOW (ref 4.22–5.81)
RDW: 14 % (ref 11.5–15.5)
WBC: 6.5 10*3/uL (ref 4.0–10.5)
nRBC: 0 % (ref 0.0–0.2)

## 2022-01-14 LAB — COMPREHENSIVE METABOLIC PANEL
ALT: 16 U/L (ref 0–44)
AST: 20 U/L (ref 15–41)
Albumin: 2.5 g/dL — ABNORMAL LOW (ref 3.5–5.0)
Alkaline Phosphatase: 51 U/L (ref 38–126)
Anion gap: 8 (ref 5–15)
BUN: 11 mg/dL (ref 8–23)
CO2: 19 mmol/L — ABNORMAL LOW (ref 22–32)
Calcium: 8.7 mg/dL — ABNORMAL LOW (ref 8.9–10.3)
Chloride: 110 mmol/L (ref 98–111)
Creatinine, Ser: 0.72 mg/dL (ref 0.61–1.24)
GFR, Estimated: >60 mL/min (ref >60)
Glucose, Bld: 119 mg/dL — ABNORMAL HIGH (ref 70–99)
Potassium: 3.7 mmol/L (ref 3.5–5.1)
Sodium: 137 mmol/L (ref 135–145)
Total Bilirubin: 0.6 mg/dL (ref 0.3–1.2)
Total Protein: 6.2 g/dL — ABNORMAL LOW (ref 6.5–8.1)

## 2022-01-14 LAB — PHOSPHORUS: Phosphorus: 2.1 mg/dL — ABNORMAL LOW (ref 2.5–4.6)

## 2022-01-14 LAB — MAGNESIUM
Magnesium: 1.5 mg/dL — ABNORMAL LOW (ref 1.7–2.4)
Magnesium: 1.8 mg/dL (ref 1.7–2.4)

## 2022-01-14 MED ORDER — MAGNESIUM SULFATE 2 GM/50ML IV SOLN
2.0000 g | Freq: Once | INTRAVENOUS | Status: AC
  Administered 2022-01-14: 2 g via INTRAVENOUS
  Filled 2022-01-14: qty 50

## 2022-01-14 MED ORDER — BUDESONIDE 3 MG PO CPEP: ORAL_CAPSULE | ORAL | 4 refills | Status: DC

## 2022-01-14 MED ORDER — PANTOPRAZOLE SODIUM 40 MG PO TBEC
40.0000 mg | DELAYED_RELEASE_TABLET | Freq: Two times a day (BID) | ORAL | Status: DC
  Administered 2022-01-14 – 2022-01-15 (×2): 40 mg via ORAL
  Filled 2022-01-14 (×2): qty 1

## 2022-01-14 NOTE — Progress Notes (Addendum)
Patient ID: Ryan [name], male   DOB: [birth year], 76 y.o.   MRN: [id number]    Progress Note   Subjective   Day #4 CC; diarrhea, nausea vomiting, abdominal pain-SIRS in patient undergoing radiation therapy for prostate cancer and with history of lymphocytic colitis  Unasyn IV Eliquis Cardizem drip Budesonide started yesterday  Blood cultures no growth GI path panel still pending WBC 6.5/hemoglobin 9.7/hematocrit 28.3 Potassium 3.7/BUN 11/creatinine 0.72 Magnesium 1.5/phosphorus 2.1  He says he is definitely feeling better, getting an appetite, had solid food for the first time today, nausea and vomiting has resolved, abdominal pain has resolved, still has a lot of "volcano noises" in his abdomen, diarrhea has slowed and some mild form today     Objective   Vital signs in last 24 hours: Temp:  [98.1 F (36.7 C)-98.5 F (36.9 C)] 98.5 F (36.9 C) (04/11 2000) Pulse Rate:  [74-114] 98 (04/11 2200) Resp:  [20-22] 20 (04/11 2200) BP: (104-143)/(66-84) 140/77 (04/11 2200) SpO2:  [88 %-96 %] 93 % (04/11 2200) Weight:  [76.7 kg] 76.7 kg (04/12 0500) Last BM Date : 01/13/22 General:    Elderly white male in NAD, looking better Heart: tachy irRegular rate and rhythm; no murmurs Lungs: Respirations even and unlabored, lungs CTA bilaterally Abdomen:  Soft, nontender and nondistended.  Bowel sounds active somewhat hyperactive Extremities:  Without edema. Neurologic:  Alert and oriented,  grossly normal neurologically. Psych:  Cooperative. Normal mood and affect.  Intake/Output from previous day: 04/11 0701 - 04/12 0700 In: 487.5 [P.O.:360; I.V.:27.5; IV Piggyback:100] Out: 300 [Urine:300] Intake/Output this shift: No intake/output data recorded.  Lab Results: Recent Labs    01/12/22 0318 01/13/22 0352 01/14/22 0401  WBC 7.8 7.9 6.5  HGB 9.6* 9.0* 9.7*  HCT 28.8* 27.2* 28.3*  PLT 215 223 243   BMET Recent Labs    01/12/22 1348 01/13/22 0352 01/14/22 0401  NA 135  134* 137  K 4.2 4.6 3.7  CL 107 109 110  CO2 17* 18* 19*  GLUCOSE 79 101* 119*  BUN 14 15 11   CREATININE 0.91 0.94 0.72  CALCIUM 7.7* 8.3* 8.7*   LFT Recent Labs    01/14/22 0401  PROT 6.2*  ALBUMIN 2.5*  AST 20  ALT 16  ALKPHOS 51  BILITOT 0.6   PT/INR No results for input(s): LABPROT, INR in the last 72 hours.  Studies/Results: ECHOCARDIOGRAM COMPLETE  Result Date: 01/13/2022    ECHOCARDIOGRAM REPORT   Patient Name:   Ryan [name] Date of Exam: 01/13/2022 Medical Rec #:  [id number]      Height:       64.0 in Accession #:    [id number]     Weight:       168.9 lb Date of Birth:  Sep 15, 1946      BSA:          1.821 m Patient Age:    76 years       BP:           122/59 mmHg Patient Gender: M              HR:           102 bpm. Exam Location:  Inpatient Procedure: 2D Echo, Cardiac Doppler and Color Doppler Indications:    Afib  History:        Patient has prior history of Echocardiogram examinations. Prior                 CABG,  COPD, Arrythmias:Atrial Fibrillation; Risk                 Factors:Hypertension and Former Smoker.  Sonographer:    Cleatis Polka Referring Phys: [phone] A CALDWELL POWELL JR IMPRESSIONS  1. Left ventricular ejection fraction, by estimation, is 55 to 60%. The left ventricle has normal function. The left ventricle has no regional wall motion abnormalities. Left ventricular diastolic function could not be evaluated.  2. Right ventricular systolic function is normal. The right ventricular size is normal.  3. Left atrial size was severely dilated.  4. Right atrial size was severely dilated.  5. The mitral valve is normal in structure. Mild mitral valve regurgitation. No evidence of mitral stenosis.  6. The aortic valve is normal in structure. Aortic valve regurgitation is not visualized. Aortic valve sclerosis is present, with no evidence of aortic valve stenosis.  7. The inferior vena cava is normal in size with greater than 50% respiratory variability, suggesting right  atrial pressure of 3 mmHg. FINDINGS  Left Ventricle: Left ventricular ejection fraction, by estimation, is 55 to 60%. The left ventricle has normal function. The left ventricle has no regional wall motion abnormalities. The left ventricular internal cavity size was normal in size. There is  no left ventricular hypertrophy. Left ventricular diastolic function could not be evaluated due to atrial fibrillation. Left ventricular diastolic function could not be evaluated. Right Ventricle: The right ventricular size is normal. No increase in right ventricular wall thickness. Right ventricular systolic function is normal. Left Atrium: Left atrial size was severely dilated. Right Atrium: Right atrial size was severely dilated. Pericardium: There is no evidence of pericardial effusion. Presence of epicardial fat layer. Mitral Valve: The mitral valve is normal in structure. Mild mitral valve regurgitation. No evidence of mitral valve stenosis. Tricuspid Valve: The tricuspid valve is normal in structure. Tricuspid valve regurgitation is trivial. No evidence of tricuspid stenosis. Aortic Valve: The aortic valve is normal in structure. Aortic valve regurgitation is not visualized. Aortic valve sclerosis is present, with no evidence of aortic valve stenosis. Aortic valve peak gradient measures 12.2 mmHg. Pulmonic Valve: The pulmonic valve was not well visualized. Pulmonic valve regurgitation is not visualized. No evidence of pulmonic stenosis. Aorta: The aortic root is normal in size and structure. Venous: The inferior vena cava is normal in size with greater than 50% respiratory variability, suggesting right atrial pressure of 3 mmHg. IAS/Shunts: No atrial level shunt detected by color flow Doppler.  LEFT VENTRICLE PLAX 2D LVIDd:         4.80 cm      Diastology LVIDs:         3.40 cm      LV e' medial:    5.55 cm/s LV PW:         1.00 cm      LV E/e' medial:  21.3 LV IVS:        1.00 cm      LV e' lateral:   8.81 cm/s LVOT diam:      2.20 cm      LV E/e' lateral: 13.4 LV SV:         59 LV SV Index:   32 LVOT Area:     3.80 cm  LV Volumes (MOD) LV vol d, MOD A2C: 103.0 ml LV vol d, MOD A4C: 99.6 ml LV vol s, MOD A2C: 44.2 ml LV vol s, MOD A4C: 34.8 ml LV SV MOD A2C:     58.8 ml LV  SV MOD A4C:     99.6 ml LV SV MOD BP:      65.2 ml RIGHT VENTRICLE            IVC RV Basal diam:  4.00 cm    IVC diam: 2.10 cm RV Mid diam:    2.90 cm RV S prime:     6.20 cm/s TAPSE (M-mode): 1.2 cm LEFT ATRIUM              Index        RIGHT ATRIUM           Index LA diam:        4.80 cm  2.64 cm/m   RA Area:     23.60 cm LA Vol (A2C):   103.0 ml 56.57 ml/m  RA Volume:   70.60 ml  38.78 ml/m LA Vol (A4C):   76.2 ml  41.85 ml/m LA Biplane Vol: 93.1 ml  51.14 ml/m  AORTIC VALVE AV Area (Vmax): 1.95 cm AV Vmax:        175.00 cm/s AV Peak Grad:   12.2 mmHg LVOT Vmax:      90.00 cm/s LVOT Vmean:     63.900 cm/s LVOT VTI:       0.154 m  AORTA Ao Root diam: 3.30 cm Ao Asc diam:  3.40 cm MITRAL VALVE                TRICUSPID VALVE MV Area (PHT): 4.93 cm     TR Peak grad:   31.6 mmHg MV Decel Time: 154 msec     TR Vmax:        281.00 cm/s MR Peak grad: 91.2 mmHg MR Mean grad: 64.0 mmHg     SHUNTS MR Vmax:      477.50 cm/s   Systemic VTI:  0.15 m MR Vmean:     385.0 cm/s    Systemic Diam: 2.20 cm MV E velocity: 118.00 cm/s Kardie Tobb DO Electronically signed by Thomasene Ripple DO Signature Date/Time: 01/13/2022/11:25:57 AM    Final        Assessment / Plan:    #41 76 year old white male with history of lymphocytic colitis, had not been on any medication over the past few months, undergoing radiation therapy for prostate cancer who was admitted with progressive nausea, intermittent vomiting chills, then fever abdominal pain poor intake and worsening diarrhea  CT scan suggestive of an enteritis versus ileus Parameters consistent with SIRS.  He is improving with empiric antibiotics, initially Maxipime, now Unasyn  Etiology of acute illness is not entirely  clear though very suspicious for acute infectious etiology viral versus bacterial-blood cultures are negative GI path panel still pending C. 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  Hypokalemia corrected #8 CT showing fullness in the gastric body-EGD 1 year ago with multiple sessile gastric polyps/some congested mucosa in the fundus-negative biopsies  Plan; diet as tolerated Await GI path panel Continue budesonide 9 mg daily  GI will follow up on GI path panel, We will arrange for outpatient follow-up  with Dr. Barron Alvine- Appt scheduled for 5/8 /23 at 11 am Dr.Cirigliano - [place] office Please discharge on budesonide 3 mg, 3 tablets p.o. every morning-we will likely need 53-month course with gradual tapering thereafter.   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  Agree with interval progress note as outlined above.  Patient started on  budesonide for possible microscopic colitis related diarrhea.  Overall he is feeling better.  Continue on budesonide.  Outpatient GI follow-up arranged.  We will sign off.  Wilhemina Bonito. Eda Keys., M.D. [facility] Gastroenterology

## 2022-01-14 NOTE — Discharge Instructions (Addendum)
Ryan [name], ? ?You in the hospital with diarrhea, nausea, vomiting.  You are given IV fluids and antibiotics for support.  He also started on budesonide which seems to have helped significantly with your diarrheal illness.  Please follow-up with the GI doctors as an outpatient and continue budesonide as prescribed.  You have also been discharged on a course of antibiotics to continue as an outpatient. ? ? ?Information on my medicine - ELIQUIS? (apixaban) ? ?Why was Eliquis? prescribed for you? ?Eliquis? was prescribed for you to reduce the risk of a blood clot forming that can cause a stroke if you have a medical condition called atrial fibrillation (a type of irregular heartbeat). ? ?What do You need to know about Eliquis? ? ?Take your Eliquis? TWICE DAILY - one tablet in the morning and one tablet in the evening with or without food. If you have difficulty swallowing the tablet whole please discuss with your pharmacist how to take the medication safely. ? ?Take Eliquis? exactly as prescribed by your doctor and DO NOT stop taking Eliquis? without talking to the doctor who prescribed the medication.  Stopping may increase your risk of developing a stroke.  Refill your prescription before you run out. ? ?After discharge, you should have regular check-up appointments with your healthcare provider that is prescribing your Eliquis?.  In the future your dose may need to be changed if your kidney function or weight changes by a significant amount or as you get older. ? ?What do you do if you miss a dose? ?If you miss a dose, take it as soon as you remember on the same day and resume taking twice daily.  Do not take more than one dose of ELIQUIS at the same time to make up a missed dose. ? ?Important Safety Information ?A possible side effect of Eliquis? is bleeding. You should call your healthcare provider right away if you experience any of the following: ?Bleeding from an injury or your nose that does not  stop. ?Unusual colored urine (red or dark brown) or unusual colored stools (red or black). ?Unusual bruising for unknown reasons. ?A serious fall or if you hit your head (even if there is no bleeding). ? ?Some medicines may interact with Eliquis? and might increase your risk of bleeding or clotting while on Eliquis?Marland Kitchen To help avoid this, consult your healthcare provider or pharmacist prior to using any new prescription or non-prescription medications, including herbals, vitamins, non-steroidal anti-inflammatory drugs (NSAIDs) and supplements. ? ?This website has more information on Eliquis? (apixaban): http://www.eliquis.com/eliquis/home ? ?

## 2022-01-14 NOTE — Telephone Encounter (Signed)
Patient has been scheduled for a follow up appt with Dr. Bryan Lemma on Monday, 02/09/22 at 11 am at the [facility] office. Amy is aware of appt. ? ?RX for Budesonide sent to pharmacy on file. ?

## 2022-01-14 NOTE — Telephone Encounter (Signed)
-----   Message from Alfredia Ferguson, PA-C sent at 01/14/2022 12:34 PM EDT ----- ?Regarding: Office follow-up ?Beth, this patient is currently hospitalized, known to Dr. Bryan Lemma with lymphocytic colitis ? ?Please get him a follow-up office visit with Dr. Bryan Lemma I can add that to his chart if you give me the date.  Be sure we know which location he will be seen at ? ?Vito-just FYI on this patient he has been hospitalized with an acute illness with SIRS, etiology not clear, suspect acute enteritis/viral versus bacterial this is in the setting of radiation therapy for his prostate cancer which is now on hold ?He has improved with empiric IV antibiotics, blood cultures negative, GI path panel still pending, C. difficile negative ? ?We just restarted budesonide 9 mg daily.  He has been singing your praises, and happy that I work with such a great team but unhappy that you  would not let him stay on that budesonide;) ? ?He will go home on budesonide 9 mg daily, expect he will need a 59-month course and then gradual tapering as he tolerates ? ?Beth- please send a prescription for budesonide 3 mg tablets/3 p.o. every morning to his pharmacy #[facility] and 4 refills. ? ? ? ?

## 2022-01-14 NOTE — Progress Notes (Signed)
? ?PROGRESS NOTE ? ? ? Ryan [name]  WGN:[id number] DOB: September 28, 1946 DOA: 01/11/2022 ?PCP: [name], MD ? ? ?Brief Narrative: ?Ryan [name] is a 76 y.o. male with medical history significant of prostate cancer on radiation therapy, CAD s/p CABG, COPD, atrial fibrillation on eliquis, hx lymphocytic colitis, and multiple other medical issues presenting with nausea, vomiting, diarrhea, and c/o pain.  Symptoms suspected to be related to his radiation therapy vs microscopic colitis.  CT abd/pelvis showed possible enteritis/ileus, masslike thickening of stomach folds, trace effusions and bladder thickening.  Also with fever, possible infection without identified source. Empiric antibiotics initiated for possible GI source. GI consulted and have started patient on budesonide with improvement of symptoms. ? ? ?Assessment and Plan: ?* Nausea, vomiting, and diarrhea ?Possibly related to radiation therapy. Patient managed with supportive care. Antiemetics and IV fluids provided with improvement of symptoms. Currently with resolved nausea and vomiting. Diarrhea is still persistent but has improved. 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 ? ?SIRS (systemic inflammatory response syndrome) (HCC) ?Tachycardia, tachypnea, rigors without clear source. Urine culture and blood cultures with no growth. C. Difficile negative. GI pathogen panel is pending. Patient started empirically on Vancomycin and Cefepime and transitioned to Unasyn with plan for 7 day empiric antibiotic course. ?-Continue Unasyn ? ?Atrial fibrillation with RVR ([place]) ?Likely related to dehydration from diarrhea and emesis. Patient was managed on diltiazem drip which has been discontinued. Heart rate currently well controlled. Patient in atrial fibrillation currently. On metoprolol and Eliquis. ?-Continue metoprolol and Eliquis ?-Continue telemetry ? ?Hypocalcemia ?Improved. ? ?Hypomagnesemia ?Recurrent. ?-Hold oral  magnesium ?-Magnesium IV with repeat magnesium check this evening ? ?Hypokalemia-resolved as of 01/14/2022 ?Resolved with repletion. ? 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 Lasix held secondary to dehydration on admission from diarrhea and emesis. ? ?Prostate cancer ([place]) ?Followed by Dr. Eulogio Ditch and Dr. Tammi Klippel, currently undergoing radiation therapy. ? ?Hx of CABG ?Patient is on aspirin, Repatha and metoprolol. ?-Continue aspirin and metoprolol ? ?Essential hypertension ?-Continue home metoprolol ? ?COPD (chronic obstructive pulmonary disease) ([place]) ?No exacerbation.  ?-Continue albuterol PRN ? ?Chest pain ?Chronic. Likely musculoskeletal. Negative troponin. ? ? ? ?DVT prophylaxis: Eliquis ?Code Status:   Code Status: Full Code ?Family Communication: Wife at bedside ?Disposition Plan: Discharge home likely in 24 hours pending improvement of diarrhea ? ? ?Consultants:  ?Gastroenterology ? ?Procedures:  ?None ? ?Antimicrobials: ?Vancomycin ?Cefepime ?Unasyn  ? ? ?Subjective: ?Patient reports no abdominal pain, nausea or vomiting. Some continued diarrhea. ? ?Objective: ?BP 110/68 (BP Location: Left Arm)   Pulse (!) 101   Temp 97.9 ?F (36.6 ?C) (Oral)   Resp (!) 22   Ht 5\' 4"  (1.626 m)   Wt 76.7 kg   SpO2 95%   BMI 29.02 kg/m?  ? ?Examination: ? ?General exam: Appears calm and comfortable ?Respiratory system: Clear to auscultation. Respiratory effort normal. ?Cardiovascular system: S1 & S2 heard, irregular rhythm with normal rate. ?Gastrointestinal system: Abdomen is nondistended, soft and nontender. No organomegaly or masses felt. Normal bowel sounds heard. ?Central nervous system: Alert and oriented. No focal neurological deficits. ?Musculoskeletal: No calf tenderness ?Skin: No cyanosis. No rashes ?Psychiatry: Judgement and insight appear normal. Mood & affect appropriate.  ? ? ?Data Reviewed: I have personally reviewed following labs and imaging  studies ? ?CBC ?Lab Results  ?Component Value Date  ? WBC 6.5 01/14/2022  ? RBC 3.31 (L) 01/14/2022  ? HGB 9.7 (L) 01/14/2022  ?  HCT 28.3 (L) 01/14/2022  ? MCV 85.5 01/14/2022  ? MCH 29.3 01/14/2022  ? PLT 243 01/14/2022  ? MCHC 34.3 01/14/2022  ? RDW 14.0 01/14/2022  ? LYMPHSABS 0.4 (L) 01/14/2022  ? MONOABS 0.5 01/14/2022  ? EOSABS 0.0 01/14/2022  ? BASOSABS 0.0 01/14/2022  ? ? ? ?Last metabolic panel ?Lab Results  ?Component Value Date  ? NA 137 01/14/2022  ? K 3.7 01/14/2022  ? CL 110 01/14/2022  ? CO2 19 (L) 01/14/2022  ? BUN 11 01/14/2022  ? CREATININE 0.72 01/14/2022  ? GLUCOSE 119 (H) 01/14/2022  ? GFRNONAA >60 01/14/2022  ? GFRAA 56 (L) 05/02/2020  ? CALCIUM 8.7 (L) 01/14/2022  ? PHOS 2.1 (L) 01/14/2022  ? PROT 6.2 (L) 01/14/2022  ? ALBUMIN 2.5 (L) 01/14/2022  ? BILITOT 0.6 01/14/2022  ? ALKPHOS 51 01/14/2022  ? AST 20 01/14/2022  ? ALT 16 01/14/2022  ? ANIONGAP 8 01/14/2022  ? ? ?GFR: ?Estimated Creatinine Clearance: 74.7 mL/min (by C-G formula based on SCr of 0.72 mg/dL). ? ?Recent Results (from the past 240 hour(s))  ?Urine Culture     Status: None  ? Collection Time: 01/11/22  2:45 PM  ? Specimen: Urine, Clean Catch  ?Result Value Ref Range Status  ? Specimen Description   Final  ?  URINE, CLEAN CATCH ?Performed at [facility], [place] [address], [place], [place] [address] ?  ? Special Requests   Final  ?  NONE ?Performed at [facility], [place] [address] [place], [place] [address] ?  ? Culture   Final  ?  NO GROWTH ?Performed at [facility], [place] [address] [place], [place] [address] ?  ? Report Status 01/13/2022 FINAL  Final  ?Culture, blood (routine x 2)     Status: None (Preliminary result)  ? Collection Time: 01/11/22  6:40 PM  ? Specimen: BLOOD  ?Result Value Ref Range Status  ? Specimen Description   Final  ?  BLOOD LEFT ANTECUBITAL ?Performed at [facility], [place] [address] [place], [place] [address] ?  ? Special Requests   Final  ?   BOTTLES DRAWN AEROBIC AND ANAEROBIC Blood Culture adequate volume ?Performed at [facility], [place] [address] [place], [place] [address] ?  ? Culture   Final  ?  NO GROWTH 3 DAYS ?Performed at [facility], [place] [address], [place], [place] [address] ?  ? Report Status PENDING  Incomplete  ?Culture, blood (routine x 2)     Status: None (Preliminary result)  ? Collection Time: 01/11/22  6:40 PM  ? Specimen: BLOOD  ?Result Value Ref Range Status  ? Specimen Description   Final  ?  BLOOD RIGHT ANTECUBITAL ?Performed at [facility], [place] [address], [place], [place] [address] ?  ? Special Requests   Final  ?  BOTTLES DRAWN AEROBIC AND ANAEROBIC Blood Culture adequate volume ?Performed at [facility], [place] [address], [place], [place] [address] ?  ? Culture   Final  ?  NO GROWTH 3 DAYS ?Performed at [facility], [place] [address], [place],  [address] ?  ? Report Status PENDING  Incomplete  ?Resp Panel by RT-PCR (Flu A&B, Covid) Nasopharyngeal Swab     Status: None  ? Collection Time: 01/11/22  6:41 PM  ? Specimen: Nasopharyngeal Swab; Nasopharyngeal(NP) swabs in vial transport medium  ?Result Value Ref Range Status  ? SARS Coronavirus 2 by  RT PCR NEGATIVE NEGATIVE Final  ?  Comment: (NOTE) ?SARS-CoV-2 target nucleic acids are NOT DETECTED. ? ?The SARS-CoV-2 RNA is generally detectable in upper respiratory ?specimens during the acute phase of infection. The lowest ?concentration of SARS-CoV-2 viral copies this assay can detect is ?138 copies/mL. A negative result does not preclude SARS-Cov-2 ?infection and should not be used as the sole basis for treatment or ?other patient management decisions. A negative result may occur with  ?improper specimen collection/handling, submission of specimen other ?than nasopharyngeal swab, presence of viral mutation(s) within the ?areas targeted by this assay, and inadequate number of viral ?copies(<138 copies/mL). A  negative result must be combined with ?clinical observations, patient history, and epidemiological ?information. The expected result is Negative. ? ?Fact Sheet for Patients:  ?EntrepreneurPulse.com.au ? ?F

## 2022-01-15 ENCOUNTER — Ambulatory Visit: Payer: Medicare Other

## 2022-01-15 DIAGNOSIS — R112 Nausea with vomiting, unspecified: Secondary | ICD-10-CM | POA: Diagnosis not present

## 2022-01-15 DIAGNOSIS — R197 Diarrhea, unspecified: Secondary | ICD-10-CM | POA: Diagnosis not present

## 2022-01-15 MED ORDER — BUDESONIDE 3 MG PO CPEP: 9.0000 mg | ORAL_CAPSULE | Freq: Every day | ORAL | 0 refills | Status: AC

## 2022-01-15 MED ORDER — AMOXICILLIN-POT CLAVULANATE 875-125 MG PO TABS: 1.0000 | ORAL_TABLET | Freq: Two times a day (BID) | ORAL | 0 refills | Status: DC

## 2022-01-15 NOTE — Progress Notes (Signed)
?  Radiation Oncology         [phone] ?________________________________ ? ?Name: Ryan [name] MRN: [id number]  ?Date: 01/11/2022  DOB: September 10, 1946 ? ?INPATIENT Chart Note: ? ?Recent admission for enteritis noted.  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  Accordingly, he has been receiving planned 25 fractions of radiation to prostate and pelvic lymph nodes, with plan to reduce radiation target to prostate only for final 15 treatments.  To date, he has completed 22 treatments.  In light of bowel issues, I will revise his radiation plan to complete pelvic treatment, now at 22 treatments, and change his boost treatment to 18 fractions to the prostate only, in hopes that by eliminating radiation to the pelvic nodes will eliminate any further exacerbation of enteritis. ? ?Plan:  At this point, the patient is set up to proceed with radiation boost, when he is stable. ? ?________________________________ ? ?Sheral Apley Tammi Klippel, M.D. ? ? ? ?

## 2022-01-15 NOTE — Evaluation (Signed)
Physical Therapy Evaluation ?Patient Details ?Name: Ryan [name] ?MRN: [id number] ?DOB: 1946/07/14 ?Today's Date: 01/15/2022 ? ?History of Present Illness ? Ryan [name] is a 76 y.o. male with medical history significant of prostate cancer on radiation therapy, CAD s/p CABG, COPD, atrial fibrillation on eliquis, hx lymphocytic colitis,  presenting 01/11/22 with nausea, vomiting, diarrhea, fever, and c/o pain.  Symptoms suspected to be related to his radiation therapy vs microscopic colitis.  CT abd/pelvis showed possible enteritis/ileus,  ?Clinical Impression ? The patient is  mobilizing in room with not assistance. Patient did ambulate in room x 50' on RA, SPO2 84%, HR noted irregular from 104-120's. SPO2 on 2 L 96% after ambulation and resting in bed.  ?Patient requires no physical assistance. ? Patient will need a saturation walk test prior to DC which can be performed by nursing. No further PT needs at thios time. PT will sign off.   ?   ? ?Recommendations for follow up therapy are one component of a multi-disciplinary discharge planning process, led by the attending physician.  Recommendations may be updated based on patient status, additional functional criteria and insurance authorization. ? ?Follow Up Recommendations No PT follow up ? ?  ?Assistance Recommended at Discharge PRN  ?Patient can return home with the following ? Assist for transportation;Assistance with cooking/housework;Help with stairs or ramp for entrance ? ?  ?Equipment Recommendations None recommended by PT  ?Recommendations for Other Services ?    ?  ?Functional Status Assessment Patient has not had a recent decline in their functional status  ? ?  ?Precautions / Restrictions Precautions ?Precautions: Fall ?Precaution Comments: diarrhea, on precautions  ? ?  ? ?Mobility ? Bed Mobility ?Overal bed mobility: Independent ?  ?  ?  ?  ?  ?  ?  ?  ? ?Transfers ?Overall transfer level: Independent ?  ?  ?  ?  ?  ?  ?  ?  ?  ?   ? ?Ambulation/Gait ?Ambulation/Gait assistance: Independent ?Gait Distance (Feet): 50 Feet ?Assistive device: None ?Gait Pattern/deviations: Step-through pattern ?  ?Gait velocity interpretation: <1.31 ft/sec, indicative of household ambulator ?  ?General Gait Details: patient ambulkated around room x 2, did not want any support, did not  use any device. ? ?Stairs ?  ?  ?  ?  ?  ? ?Wheelchair Mobility ?  ? ?Modified Rankin (Stroke Patients Only) ?  ? ?  ? ?Balance Overall balance assessment: No apparent balance deficits (not formally assessed) ?  ?  ?  ?  ?  ?  ?  ?  ?  ?  ?  ?  ?  ?  ?  ?  ?  ?  ?   ? ? ? ?Pertinent Vitals/Pain Pain Assessment ?Pain Assessment: No/denies pain  ? ? ?Home Living Family/patient expects to be discharged to:: Private residence ?Living Arrangements: Spouse/significant other ?Available Help at Discharge: Family;Neighbor;Available 24 hours/day ?Type of Home: House ?Home Access: Stairs to enter ?Entrance Stairs-Rails: Right ?Entrance Stairs-Number of Steps: 3 ?Alternate Level Stairs-Number of Steps: flight ?Home Layout: Two level ?Home Equipment: Conservation officer, nature (2 wheels);Standard Walker;Cane - quad;Cane - single point;Shower seat - built in;BSC/3in1 ?Additional Comments: lives with wife who can assist as needed. wife has early dementia, per pt but able to drive patient, per pt.  ?  ?Prior Function Prior Level of Function : Independent/Modified Independent ?  ?  ?  ?  ?  ?  ?Mobility Comments: Independent without DME but uses RW at  night for trips to bathroom, does not drive due to Mac. degen. ?  ?  ? ? ?Hand Dominance  ? Dominant Hand: Right ? ?  ?Extremity/Trunk Assessment  ?   ?  ? ?Lower Extremity Assessment ?Lower Extremity Assessment: Overall WFL for tasks assessed ?  ? ?Cervical / Trunk Assessment ?Cervical / Trunk Assessment: Normal  ?Communication  ? Communication: No difficulties  ?Cognition Arousal/Alertness: Awake/alert ?Behavior During Therapy: [facility] for tasks  assessed/performed ?Overall Cognitive Status: Within Functional Limits for tasks assessed ?  ?  ?  ?  ?  ?  ?  ?  ?  ?  ?  ?  ?  ?  ?  ?  ?  ?  ?  ? ?  ?General Comments   ? ?  ?Exercises    ? ?Assessment/Plan  ?  ?PT Assessment Patient does not need any further PT services  ?PT Problem List   ? ?   ?  ?PT Treatment Interventions     ? ?PT Goals (Current goals can be found in the Care Plan section)  ?Acute Rehab PT Goals ?Patient Stated Goal: to go home, not  have O2 ?PT Goal Formulation: All assessment and education complete, DC therapy ? ?  ?Frequency   ?  ? ? ?Co-evaluation   ?  ?  ?  ?  ? ? ?  ?AM-PAC PT "6 Clicks" Mobility  ?Outcome Measure Help needed turning from your back to your side while in a flat bed without using bedrails?: None ?Help needed moving from lying on your back to sitting on the side of a flat bed without using bedrails?: None ?Help needed moving to and from a bed to a chair (including a wheelchair)?: None ?Help needed standing up from a chair using your arms (e.g., wheelchair or bedside chair)?: None ?Help needed to walk in hospital room?: None ?Help needed climbing 3-5 steps with a railing? : A Little ?6 Click Score: 23 ? ?  ?End of Session   ?Activity Tolerance: Patient tolerated treatment well ?Patient left: in bed;with call bell/phone within reach;with family/visitor present;with bed alarm set ?Nurse Communication: Mobility status ?PT Visit Diagnosis: Unsteadiness on feet (R26.81) ?  ? ?Time: 0388-8280 ?PT Time Calculation (min) (ACUTE ONLY): 22 min ? ? ?Charges:   PT Evaluation ?$PT Eval Low Complexity: 1 Low ?  ?  ?   ? ? ?Tresa Endo PT ?Acute Rehabilitation Services ?Pager [phone] ?Office [phone] ? ? ?Catalino Plascencia, Shella Maxim ?01/15/2022, 10:58 AM ? ?

## 2022-01-15 NOTE — Consult Note (Addendum)
? ?  [facility] CM Inpatient Consult ? ? ?01/15/2022 ? ?Ryan [name] ?[birth year] ?[id number] ? ?[facility] [ACO] Patient: Medicare ACO Reach ? ?[name]: Coverage follow up for high risk score unplanned readmission risk 3 hospital admissions in past 6 months noted ? ?Primary Care Provider:  [name], MD, [place] [facility],  is an embedded provider with a Chronic Care Management team and program, and is listed for the transition of care follow up and appointments. ? ?Patient was screened for Embedded practice service needs for chronic care management.  ? ?Plan: Patient to receive Embedded Care Management TOC follow up for post hospital outreach. ? ?Please contact for further questions, ? ?Natividad Brood, RN BSN CCM ?[facility] Liaison ? [phone] business mobile phone ?Toll free office [phone]  ?Fax number: [phone] ?[name] ?[email or web address] ? ? ? ?

## 2022-01-15 NOTE — Discharge Summary (Signed)
?Physician Discharge Summary ?  ?Patient: Ryan [name] MRN: [id number] DOB: March 19, 1946  ?Admit date:     01/11/2022  ?Discharge date: 01/15/22  ?Discharge Physician: Cordelia Poche, MD  ? ?PCP: [name], MD  ? ?Recommendations at discharge:  ? ?PCP and GI follow-up ?Complete antibiotic regimen ? ?Discharge Diagnoses: ?Principal Problem: ?  Nausea, vomiting, and diarrhea ?Active Problems: ?  SIRS (systemic inflammatory response syndrome) (HCC) ?  Atrial fibrillation with RVR ([place]) ?  Hypocalcemia ?  Essential hypertension ?  Hx of CABG ?  Prostate cancer ([place]) ?  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  COPD (chronic obstructive pulmonary disease) ([place]) ?  Macular degeneration (senile) of retina ?  Diarrhea ?  Poor appetite ? ?Resolved Problems: ?  Hypomagnesemia ?  Hypokalemia ?  Chest pain ? ?Hospital Course: ?Ryan [name] is a 76 y.o. male with medical history significant of prostate cancer on radiation therapy, CAD s/p CABG, COPD, atrial fibrillation on eliquis, hx lymphocytic colitis, and multiple other medical issues presenting with nausea, vomiting, diarrhea, and c/o pain.  Symptoms suspected to be related to his radiation therapy vs microscopic colitis.  CT abd/pelvis showed possible enteritis/ileus, masslike thickening of stomach folds, trace effusions and bladder thickening.  Also with fever, possible infection without identified source. Empiric antibiotics initiated for possible GI source. GI consulted and have started patient on budesonide with improvement of symptoms. ? ?Assessment and Plan: ?* Nausea, vomiting, and diarrhea ?Possibly related to radiation therapy. Patient managed with supportive care. Antiemetics and IV fluids provided with improvement of symptoms. Currently with resolved nausea and vomiting. Diarrhea is still persistent but has improved. 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 ? ?SIRS (systemic inflammatory response  syndrome) (HCC) ?Tachycardia, tachypnea, rigors without clear source. Urine culture and blood cultures with no growth. C. Difficile negative. GI pathogen panel is pending. Patient started empirically on Vancomycin and Cefepime and transitioned to Unasyn with plan for 7 day empiric antibiotic course. Unasyn transitioned to Augmentin on discharge. ? ?Atrial fibrillation with RVR ([place]) ?Likely related to dehydration from diarrhea and emesis. Patient was managed on diltiazem drip which has been discontinued. Heart rate currently well controlled. Patient in atrial fibrillation currently. On metoprolol and Eliquis. Continue on discharge. ? ?Hypocalcemia ?Improved. ? ?Hypokalemia-resolved as of 01/14/2022 ?Resolved with repletion. ? ?Hypomagnesemia-resolved as of 01/15/2022 ?Recurrent. Resolved with repletion. ? 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 Lasix held secondary to dehydration on admission from diarrhea and emesis. Resume on discharge. ? ?Prostate cancer ([place]) ?Followed by Dr. Eulogio Ditch and Dr. Tammi Klippel, currently undergoing radiation therapy. ? ?Hx of CABG ?Patient is on aspirin, Repatha and metoprolol. Continue aspirin and metoprolol. ? ?Essential hypertension ?Continue home metoprolol ? ?COPD (chronic obstructive pulmonary disease) ([place]) ?No exacerbation ? ?Chest pain-resolved as of 01/15/2022 ?Chronic. Likely musculoskeletal. Negative troponin. ? ? ? ? ?  ? ? ?Consultants: Gastroenterology ?Procedures performed: None  ?Disposition: Home ?Diet recommendation:  ?Cardiac diet ?DISCHARGE MEDICATION: ?Allergies as of 01/15/2022   ? ?   Reactions  ? Albumin (human) Anaphylaxis  ? Polymyxin B-trimethoprim Swelling  ? Eye drops made eyes swell  ? Pseudoephedrine Other (See Comments)  ? Stomach cramps  ? Codeine Hives, Itching, Rash  ? Guaiacol Other (See Comments)  ? Hallucinations  ? Statins Other (See Comments)  ? Muscle cramps  ? Gabapentin Other (See Comments)  ?  Pt unsure of sensitivity  ? Meloxicam Other (See Comments)  ?  Pt unsure of sensitivity  ? Peppermint Flavor Other (See Comments)  ? Severe cramping  ? Pseudoephedrine-guaifenesin Nausea And Vomiting  ? Stomach cramps  ? Rosuvastatin Calcium Other (See Comments)  ? Muscle aches  ? Tapentadol Other (See Comments)  ? Pt unsure of sensitivity  ? Ciprofloxacin Hives, Itching, Nausea Only, Rash  ? Moxifloxacin Nausea Only, Other (See Comments)  ? Headaches, stomach cramps  ? Rofecoxib Other (See Comments)  ? Stomach cramping  ? ?  ? ?  ?Medication List  ?  ? ?STOP taking these medications   ? ?Baclofen 5 MG Tabs ?  ? ?  ? ?TAKE these medications   ? ?amoxicillin-clavulanate 875-125 MG tablet ?Commonly known as: Augmentin ?Take 1 tablet by mouth 2 (two) times daily for 5 days. ?  ?apixaban 5 MG Tabs tablet ?Commonly known as: ELIQUIS ?Take 1 tablet (5 mg total) by mouth 2 (two) times daily. ?  ?aspirin 81 MG EC tablet ?Take 1 tablet (81 mg total) by mouth daily. Swallow whole. ?  ?brimonidine 0.2 % ophthalmic solution ?Commonly known as: ALPHAGAN ?Place 1 drop into the left eye in the morning and at bedtime. ?  ?budesonide 3 MG 24 hr capsule ?Commonly known as: ENTOCORT EC ?Take 3 capsules (9 mg total) by mouth daily. Take 3 capsules (9 mg total) by mouth once daily ?  ?clobetasol 0.05 % external solution ?Commonly known as: TEMOVATE ?APPLY 1 APPLICATION TOPICALLY TWICE DAILY ?What changed:  ?when to take this ?reasons to take this ?  ?Ferrous Fumarate 324 (106 Fe) MG Tabs tablet ?Commonly known as: Hemocyte ?Take 1 tablet (106 mg of iron total) by mouth daily. ?What changed: when to take this ?  ?furosemide 20 MG tablet ?Commonly known as: LASIX ?TAKE 1 TABLET BY MOUTH DAILY AS  NEEDED ?What changed: when to take this ?  ?latanoprost 0.005 % ophthalmic solution ?Commonly known as: XALATAN ?Place 1 drop into the left eye at bedtime. ?  ?levalbuterol 45 MCG/ACT inhaler ?Commonly known as: XOPENEX HFA ?INHALE 2 PUFFS BY  MOUTH INTO THE LUNGS EVERY 4 HOURS AS NEEDED FOR WHEEZING ?What changed: See the new instructions. ?  ?loperamide 2 MG capsule ?Commonly known as: IMODIUM ?TAKE 1 CAPSULE(2 MG) BY MOUTH EVERY 6 HOURS AS NEEDED FOR DIARRHEA OR LOOSE STOOLS ?What changed:  ?how much to take ?how to take this ?when to take this ?reasons to take this ?additional instructions ?  ?LUCENTIS IO ?Inject 1 Dose into the eye every 6 (six) weeks. Bilateral eye by Dr Gerarda Fraction ?  ?magnesium oxide 400 (240 Mg) MG tablet ?Commonly known as: MAGnesium-Oxide ?Take 1 tablet (400 mg total) by mouth 3 (three) times daily. ?What changed: when to take this ?  ?metoprolol tartrate 25 MG tablet ?Commonly known as: LOPRESSOR ?TAKE 1 TABLET BY MOUTH  TWICE DAILY ?  ?nitroGLYCERIN 0.4 MG SL tablet ?Commonly known as: NITROSTAT ?Place 1 tablet (0.4 mg total) under the tongue every 5 (five) minutes as needed for chest pain. ?  ?omeprazole 20 MG tablet ?Commonly known as: PRILOSEC OTC ?Take 20 mg by mouth daily. ?  ?PreserVision AREDS 2 Caps ?Take 1 tablet by mouth daily. ?  ?Repatha SureClick 557 MG/ML Soaj ?Generic drug: Evolocumab ?Inject 140 mg into the skin every 14 (fourteen) days. ?  ?tamsulosin 0.4 MG Caps capsule ?Commonly known as: FLOMAX ?Take 0.4 mg by mouth 2 (two) times daily. ?  ?vitamin E 180 MG (400 UNITS) capsule ?Take 40,000 Units by mouth daily. ?  ? ?  ? ?  Follow-up Information   ? ? Cirigliano, Vito V, DO. Go on 02/09/2022.   ?Specialty: Gastroenterology ?Why: You have appointment scheduled with Dr. Gerrit Heck on May 8 at 11 AM ?This will be at the [facility] office location ?Contact information: ?[address] ?[address] ?[place] [address] ?[phone] ? ? ?  ?  ? ? [name], MD. Schedule an appointment as soon as possible for a visit in 1 week(s).   ?Specialty: Family Medicine ?Why: For hospital follow-up ?Contact information: ?[address] ?[address] ?[place] [address] ?[phone] ? ? ?  ?  ? ?  ?  ? ?   ? ?Discharge Exam: ?Filed Weights  ? 01/13/22 0337 01/14/22 0500 01/15/22 0500  ?Weight: 76.6 kg 76.7 kg 76.6 kg  ? ?General exam: Appears calm and comfortable ?Respiratory system: Clear to auscultation. Respirat

## 2022-01-15 NOTE — Progress Notes (Signed)
Occupational Therapy Evaluation ? ?Patient lives at home with spouse and is typically independent with self care and mobility. Patient is able to complete self care tasks and ambulation in room without assistance. Note O2 saturations drop to 84-85% on room air, when asked if feeling short of breath patient states "I'm always short of breath." Does have pulse oximeter at home which he uses frequently as well as incentive spirometers. Encourage patient to sit up out of bed and ambulate to bathroom with nursing staff. No further acute OT needs at this time, will sign off.  ? ? ? 01/15/22 1200  ?OT Visit Information  ?Last OT Received On 01/15/22  ?Assistance Needed +1  ?History of Present Illness Ryan [name] is a 76 y.o. male with medical history significant of prostate cancer on radiation therapy, CAD s/p CABG, COPD, atrial fibrillation on eliquis, hx lymphocytic colitis,  presenting 01/11/22 with nausea, vomiting, diarrhea, fever, and c/o pain.  Symptoms suspected to be related to his radiation therapy vs microscopic colitis.  CT abd/pelvis showed possible enteritis/ileus,  ?Precautions  ?Precaution Comments diarrhea, on precautions  ?Restrictions  ?Weight Bearing Restrictions No  ?Home Living  ?Family/patient expects to be discharged to: Private residence  ?Living Arrangements Spouse/significant other  ?Available Help at Discharge Family;Neighbor;Available 24 hours/day  ?Type of Home House  ?Home Access Stairs to enter  ?Entrance Stairs-Number of Steps 3  ?Entrance Stairs-Rails Right  ?Home Layout Two level  ?Alternate Level Stairs-Number of Steps flight  ?Alternate Level Stairs-Rails Left  ?Bathroom Shower/Tub Walk-in shower  ?Bathroom Toilet Standard  ?Bathroom Accessibility Yes  ?Home Equipment [employer] (2 wheels);Standard Walker;Cane - quad;Cane - single point;Shower seat - built in;BSC/3in1  ?Additional Comments lives with wife who can assist as needed. wife has early dementia, per pt but able to drive  patient, per pt.  ?Prior Function  ?Prior Level of Function  Independent/Modified Independent  ?Mobility Comments Independent without DME but uses RW at night for trips to bathroom, does not drive due to Mac. degen.  ?Communication  ?Communication No difficulties  ?Pain Assessment  ?Pain Assessment No/denies pain  ?Cognition  ?Arousal/Alertness Awake/alert  ?Behavior During Therapy [facility] for tasks assessed/performed  ?Overall Cognitive Status Within Functional Limits for tasks assessed  ?Upper Extremity Assessment  ?Upper Extremity Assessment Overall WFL for tasks assessed  ?Lower Extremity Assessment  ?Lower Extremity Assessment Defer to PT evaluation  ?Cervical / Trunk Assessment  ?Cervical / Trunk Assessment Normal  ?ADL  ?Overall ADL's  Independent  ?Bed Mobility  ?Overal bed mobility Independent  ?Transfers  ?Overall transfer level Independent  ?Balance  ?Overall balance assessment No apparent balance deficits (not formally assessed)  ?General Comments  ?General comments (skin integrity, edema, etc.) Patient does desaturate to 84-85% on room air with ambulation in room. With O2 recovers to 95% on 2L seated EOB  ?OT - End of Session  ?Equipment Utilized During Treatment Oxygen  ?Activity Tolerance Patient tolerated treatment well  ?Patient left in bed;with call bell/phone within reach;with family/visitor present  ?Nurse Communication Mobility status  ?OT Assessment  ?OT Recommendation/Assessment Patient does not need any further OT services  ?OT Visit Diagnosis Other abnormalities of gait and mobility (R26.89)  ?OT Problem List Decreased activity tolerance  ?AM-PAC OT "6 Clicks" Daily Activity Outcome Measure (Version 2)  ?Help from another person eating meals? 4  ?Help from another person taking care of personal grooming? 4  ?Help from another person toileting, which includes using toliet, bedpan, or urinal? 4  ?Help from  another person bathing (including washing, rinsing, drying)? 4  ?Help from another person to  put on and taking off regular upper body clothing? 4  ?Help from another person to put on and taking off regular lower body clothing? 4  ?6 Click Score 24  ?Progressive Mobility  ?What is the highest level of mobility based on the progressive mobility assessment? Level 6 (Walks independently in room and hall) - Balance while walking in room without assist - Complete  ?Activity Ambulated independently in room  ?OT Recommendation  ?Follow Up Recommendations No OT follow up  ?Assistance recommended at discharge PRN  ?Functional Status Assessent Patient has not had a recent decline in their functional status  ?OT Equipment None recommended by OT  ?Acute Rehab OT Goals  ?Patient Stated Goal Home!  ?OT Goal Formulation All assessment and education complete, DC therapy  ?OT Time Calculation  ?OT Start Time (ACUTE ONLY) [phone]  ?OT Stop Time (ACUTE ONLY) 9562  ?OT Time Calculation (min) 21 min  ?OT General Charges  ?$OT Visit 1 Visit  ?OT Evaluation  ?$OT Eval Low Complexity 1 Low  ?Written Expression  ?Dominant Hand Right  ? ?Delbert Phenix OT ?OT pager: [phone] ? ?

## 2022-01-16 ENCOUNTER — Encounter (HOSPITAL_COMMUNITY): Payer: Self-pay

## 2022-01-16 ENCOUNTER — Ambulatory Visit: Payer: Medicare Other

## 2022-01-16 ENCOUNTER — Inpatient Hospital Stay (HOSPITAL_COMMUNITY)
Admission: EM | Admit: 2022-01-16 | Discharge: 2022-01-23 | DRG: 291 | Disposition: A | Discharge status: Home / Self Care | Payer: Medicare Other | Attending: Internal Medicine | Admitting: Internal Medicine

## 2022-01-16 ENCOUNTER — Emergency Department (HOSPITAL_COMMUNITY): Payer: Medicare Other

## 2022-01-16 ENCOUNTER — Other Ambulatory Visit: Payer: Self-pay

## 2022-01-16 DIAGNOSIS — Z96651 Presence of right artificial knee joint: Secondary | ICD-10-CM | POA: Diagnosis present

## 2022-01-16 DIAGNOSIS — J9601 Acute respiratory failure with hypoxia: Secondary | ICD-10-CM | POA: Diagnosis not present

## 2022-01-16 DIAGNOSIS — R042 Hemoptysis: Secondary | ICD-10-CM | POA: Diagnosis not present

## 2022-01-16 DIAGNOSIS — R072 Precordial pain: Principal | ICD-10-CM

## 2022-01-16 DIAGNOSIS — Z825 Family history of asthma and other chronic lower respiratory diseases: Secondary | ICD-10-CM

## 2022-01-16 DIAGNOSIS — R112 Nausea with vomiting, unspecified: Secondary | ICD-10-CM | POA: Diagnosis not present

## 2022-01-16 DIAGNOSIS — I517 Cardiomegaly: Secondary | ICD-10-CM | POA: Diagnosis not present

## 2022-01-16 DIAGNOSIS — I2510 Atherosclerotic heart disease of native coronary artery without angina pectoris: Secondary | ICD-10-CM | POA: Diagnosis present

## 2022-01-16 DIAGNOSIS — C61 Malignant neoplasm of prostate: Secondary | ICD-10-CM | POA: Diagnosis present

## 2022-01-16 DIAGNOSIS — Z7901 Long term (current) use of anticoagulants: Secondary | ICD-10-CM

## 2022-01-16 DIAGNOSIS — J9811 Atelectasis: Secondary | ICD-10-CM | POA: Diagnosis not present

## 2022-01-16 DIAGNOSIS — Z951 Presence of aortocoronary bypass graft: Secondary | ICD-10-CM

## 2022-01-16 DIAGNOSIS — I130 Hypertensive heart and chronic kidney disease with heart failure and stage 1 through stage 4 chronic kidney disease, or unspecified chronic kidney disease: Principal | ICD-10-CM | POA: Diagnosis present

## 2022-01-16 DIAGNOSIS — I5033 Acute on chronic diastolic (congestive) heart failure: Secondary | ICD-10-CM | POA: Diagnosis present

## 2022-01-16 DIAGNOSIS — K219 Gastro-esophageal reflux disease without esophagitis: Secondary | ICD-10-CM | POA: Diagnosis present

## 2022-01-16 DIAGNOSIS — I959 Hypotension, unspecified: Secondary | ICD-10-CM | POA: Diagnosis present

## 2022-01-16 DIAGNOSIS — Z86010 Personal history of colonic polyps: Secondary | ICD-10-CM

## 2022-01-16 DIAGNOSIS — K520 Gastroenteritis and colitis due to radiation: Secondary | ICD-10-CM | POA: Diagnosis present

## 2022-01-16 DIAGNOSIS — I480 Paroxysmal atrial fibrillation: Secondary | ICD-10-CM | POA: Diagnosis not present

## 2022-01-16 DIAGNOSIS — Z87891 Personal history of nicotine dependence: Secondary | ICD-10-CM

## 2022-01-16 DIAGNOSIS — Z83438 Family history of other disorder of lipoprotein metabolism and other lipidemia: Secondary | ICD-10-CM

## 2022-01-16 DIAGNOSIS — J439 Emphysema, unspecified: Secondary | ICD-10-CM | POA: Diagnosis not present

## 2022-01-16 DIAGNOSIS — N183 Chronic kidney disease, stage 3 unspecified: Secondary | ICD-10-CM | POA: Diagnosis present

## 2022-01-16 DIAGNOSIS — I482 Chronic atrial fibrillation, unspecified: Secondary | ICD-10-CM | POA: Diagnosis not present

## 2022-01-16 DIAGNOSIS — I110 Hypertensive heart disease with heart failure: Secondary | ICD-10-CM | POA: Diagnosis not present

## 2022-01-16 DIAGNOSIS — I5030 Unspecified diastolic (congestive) heart failure: Secondary | ICD-10-CM | POA: Diagnosis present

## 2022-01-16 DIAGNOSIS — H3530 Unspecified macular degeneration: Secondary | ICD-10-CM | POA: Diagnosis not present

## 2022-01-16 DIAGNOSIS — Z8673 Personal history of transient ischemic attack (TIA), and cerebral infarction without residual deficits: Secondary | ICD-10-CM

## 2022-01-16 DIAGNOSIS — E876 Hypokalemia: Secondary | ICD-10-CM | POA: Diagnosis present

## 2022-01-16 DIAGNOSIS — R197 Diarrhea, unspecified: Secondary | ICD-10-CM | POA: Diagnosis not present

## 2022-01-16 DIAGNOSIS — E611 Iron deficiency: Secondary | ICD-10-CM | POA: Diagnosis present

## 2022-01-16 DIAGNOSIS — Z8042 Family history of malignant neoplasm of prostate: Secondary | ICD-10-CM

## 2022-01-16 DIAGNOSIS — R0602 Shortness of breath: Secondary | ICD-10-CM | POA: Diagnosis not present

## 2022-01-16 DIAGNOSIS — K52832 Lymphocytic colitis: Secondary | ICD-10-CM

## 2022-01-16 DIAGNOSIS — Z9079 Acquired absence of other genital organ(s): Secondary | ICD-10-CM

## 2022-01-16 DIAGNOSIS — I10 Essential (primary) hypertension: Secondary | ICD-10-CM | POA: Diagnosis not present

## 2022-01-16 DIAGNOSIS — Z8249 Family history of ischemic heart disease and other diseases of the circulatory system: Secondary | ICD-10-CM | POA: Diagnosis not present

## 2022-01-16 DIAGNOSIS — Y842 Radiological procedure and radiotherapy as the cause of abnormal reaction of the patient, or of later complication, without mention of misadventure at the time of the procedure: Secondary | ICD-10-CM | POA: Diagnosis present

## 2022-01-16 DIAGNOSIS — Z7982 Long term (current) use of aspirin: Secondary | ICD-10-CM | POA: Diagnosis not present

## 2022-01-16 DIAGNOSIS — I509 Heart failure, unspecified: Secondary | ICD-10-CM | POA: Diagnosis not present

## 2022-01-16 DIAGNOSIS — E782 Mixed hyperlipidemia: Secondary | ICD-10-CM | POA: Diagnosis present

## 2022-01-16 DIAGNOSIS — R079 Chest pain, unspecified: Secondary | ICD-10-CM | POA: Diagnosis not present

## 2022-01-16 DIAGNOSIS — Z79899 Other long term (current) drug therapy: Secondary | ICD-10-CM

## 2022-01-16 DIAGNOSIS — J90 Pleural effusion, not elsewhere classified: Secondary | ICD-10-CM | POA: Diagnosis not present

## 2022-01-16 DIAGNOSIS — Z833 Family history of diabetes mellitus: Secondary | ICD-10-CM

## 2022-01-16 DIAGNOSIS — Z8261 Family history of arthritis: Secondary | ICD-10-CM

## 2022-01-16 DIAGNOSIS — Z981 Arthrodesis status: Secondary | ICD-10-CM

## 2022-01-16 DIAGNOSIS — I4891 Unspecified atrial fibrillation: Secondary | ICD-10-CM | POA: Diagnosis not present

## 2022-01-16 DIAGNOSIS — Z87442 Personal history of urinary calculi: Secondary | ICD-10-CM

## 2022-01-16 LAB — CBC WITH DIFFERENTIAL/PLATELET
Abs Immature Granulocytes: 0.31 10*3/uL — ABNORMAL HIGH (ref 0.00–0.07)
Basophils Absolute: 0 10*3/uL (ref 0.0–0.1)
Basophils Relative: 0 %
Eosinophils Absolute: 0.1 10*3/uL (ref 0.0–0.5)
Eosinophils Relative: 1 %
HCT: 25.5 % — ABNORMAL LOW (ref 39.0–52.0)
Hemoglobin: 8.7 g/dL — ABNORMAL LOW (ref 13.0–17.0)
Immature Granulocytes: 4 %
Lymphocytes Relative: 9 %
Lymphs Abs: 0.6 10*3/uL — ABNORMAL LOW (ref 0.7–4.0)
MCH: 30 pg (ref 26.0–34.0)
MCHC: 34.1 g/dL (ref 30.0–36.0)
MCV: 87.9 fL (ref 80.0–100.0)
Monocytes Absolute: 0.8 10*3/uL (ref 0.1–1.0)
Monocytes Relative: 11 %
Neutro Abs: 5.2 10*3/uL (ref 1.7–7.7)
Neutrophils Relative %: 75 %
Platelets: 276 10*3/uL (ref 150–400)
RBC: 2.9 MIL/uL — ABNORMAL LOW (ref 4.22–5.81)
RDW: 14.6 % (ref 11.5–15.5)
WBC: 7 10*3/uL (ref 4.0–10.5)
nRBC: 0 % (ref 0.0–0.2)

## 2022-01-16 LAB — COMPREHENSIVE METABOLIC PANEL
ALT: 20 U/L (ref 0–44)
AST: 25 U/L (ref 15–41)
Albumin: 2.8 g/dL — ABNORMAL LOW (ref 3.5–5.0)
Alkaline Phosphatase: 50 U/L (ref 38–126)
Anion gap: 8 (ref 5–15)
BUN: 18 mg/dL (ref 8–23)
CO2: 23 mmol/L (ref 22–32)
Calcium: 8.6 mg/dL — ABNORMAL LOW (ref 8.9–10.3)
Chloride: 111 mmol/L (ref 98–111)
Creatinine, Ser: 0.8 mg/dL (ref 0.61–1.24)
GFR, Estimated: >60 mL/min (ref >60)
Glucose, Bld: 111 mg/dL — ABNORMAL HIGH (ref 70–99)
Potassium: 3.4 mmol/L — ABNORMAL LOW (ref 3.5–5.1)
Sodium: 142 mmol/L (ref 135–145)
Total Bilirubin: 0.6 mg/dL (ref 0.3–1.2)
Total Protein: 6.3 g/dL — ABNORMAL LOW (ref 6.5–8.1)

## 2022-01-16 LAB — CULTURE, BLOOD (ROUTINE X 2)
Culture: NO GROWTH
Culture: NO GROWTH
Special Requests: ADEQUATE
Special Requests: ADEQUATE

## 2022-01-16 LAB — BRAIN NATRIURETIC PEPTIDE: B Natriuretic Peptide: 605.1 pg/mL — ABNORMAL HIGH (ref 0.0–100.0)

## 2022-01-16 LAB — TROPONIN I (HIGH SENSITIVITY): Troponin I (High Sensitivity): 11 ng/L (ref <18)

## 2022-01-16 MED ORDER — FUROSEMIDE 10 MG/ML IJ SOLN
20.0000 mg | Freq: Once | INTRAMUSCULAR | Status: AC
Start: 2022-01-16 — End: 2022-01-17
  Administered 2022-01-17: 20 mg via INTRAVENOUS
  Filled 2022-01-16: qty 4

## 2022-01-16 MED ORDER — FUROSEMIDE 40 MG PO TABS
40.0000 mg | ORAL_TABLET | Freq: Once | ORAL | Status: AC
Start: 2022-01-16 — End: 2022-01-17
  Administered 2022-01-17: 40 mg via ORAL
  Filled 2022-01-16: qty 1

## 2022-01-16 NOTE — ED Provider Notes (Signed)
?[facility] DEPT ?Provider Note ? ? ?CSN: [id number] ?Arrival date & time: 01/16/22  2026 ? ?  ? ?History ? ?Chief Complaint  ?Patient presents with  ? Shortness of Breath  ? ? ?Ryan [name] is a 76 y.o. male. ? ?Patient has a history of CABG hypertension COPD atrial fibs and heart failure.  He was in the hospital just recently for nausea vomiting and diarrhea.  He just went home yesterday.  They did not start back his Lasix.  He became short of breath today and had pulse ox in the mid 80s at home.  He does not use oxygen at home ? ?The history is provided by the patient. No language interpreter was used.  ?Shortness of Breath ?Severity:  Moderate ?Onset quality:  Sudden ?Timing:  Constant ?Progression:  Worsening ?Chronicity:  New ?Context: not activity   ?Relieved by:  Nothing ?Worsened by:  Nothing ?Ineffective treatments:  None tried ?Associated symptoms: no abdominal pain, no chest pain, no cough, no headaches and no rash   ? ?  ? ?Home Medications ?Prior to Admission medications   ?Medication Sig Start Date End Date Taking? Authorizing Provider  ?amoxicillin-clavulanate (AUGMENTIN) 875-125 MG tablet Take 1 tablet by mouth 2 (two) times daily for 5 days. 01/15/22 01/20/22  Mariel Aloe, MD  ?apixaban (ELIQUIS) 5 MG TABS tablet Take 1 tablet (5 mg total) by mouth 2 (two) times daily. 11/30/21   Festus Aloe, MD  ?aspirin EC 81 MG EC tablet Take 1 tablet (81 mg total) by mouth daily. Swallow whole. 08/01/21   Antony Odea, PA-C  ?brimonidine (ALPHAGAN) 0.2 % ophthalmic solution Place 1 drop into the left eye in the morning and at bedtime.  09/10/17   [provider]  ?budesonide (ENTOCORT EC) 3 MG 24 hr capsule Take 3 capsules (9 mg total) by mouth daily. Take 3 capsules (9 mg total) by mouth once daily 01/15/22 04/15/22  Mariel Aloe, MD  ?clobetasol (TEMOVATE) 0.05 % external solution APPLY 1 APPLICATION TOPICALLY TWICE DAILY ?Patient taking differently: 2  (two) times daily as needed. APPLY 1 APPLICATION TOPICALLY TWICE DAILY 11/20/16   [name], MD  ?Evolocumab (REPATHA SURECLICK) 505 MG/ML SOAJ Inject 140 mg into the skin every 14 (fourteen) days. ?Patient taking differently: Inject 140 mg into the skin every 14 (fourteen) days. 09/23/21   [name], MD  ?Ferrous Fumarate (HEMOCYTE) 324 (106 Fe) MG TABS tablet Take 1 tablet (106 mg of iron total) by mouth daily. ?Patient taking differently: Take 1 tablet by mouth at bedtime. 09/24/21   [name], MD  ?furosemide (LASIX) 20 MG tablet TAKE 1 TABLET BY MOUTH DAILY AS  NEEDED ?Patient taking differently: Take 20 mg by mouth daily. 12/22/21   [name], MD  ?latanoprost (XALATAN) 0.005 % ophthalmic solution Place 1 drop into the left eye at bedtime.     [provider]  ?levalbuterol (XOPENEX HFA) 45 MCG/ACT inhaler INHALE 2 PUFFS BY MOUTH INTO THE LUNGS EVERY 4 HOURS AS NEEDED FOR WHEEZING ?Patient taking differently: 2 puffs every 4 (four) hours as needed for shortness of breath or wheezing. 08/04/21   [name], MD  ?loperamide (IMODIUM) 2 MG capsule TAKE 1 CAPSULE(2 MG) BY MOUTH EVERY 6 HOURS AS NEEDED FOR DIARRHEA OR LOOSE STOOLS ?Patient taking differently: Take 2 mg by mouth daily as needed for diarrhea or loose stools. 01/08/22   Tyler Pita, MD  ?MAGnesium-Oxide 400 (240 Mg) MG tablet  Take 1 tablet (400 mg total) by mouth 3 (three) times daily. ?Patient taking differently: Take 400 mg by mouth 2 (two) times daily. 09/23/21   [name], MD  ?metoprolol tartrate (LOPRESSOR) 25 MG tablet TAKE 1 TABLET BY MOUTH  TWICE DAILY ?Patient taking differently: Take 25 mg by mouth 2 (two) times daily. 06/11/21   Evans Lance, MD  ?Multiple Vitamins-Minerals (PRESERVISION AREDS 2) CAPS Take 1 tablet by mouth daily.    [provider]  ?nitroGLYCERIN (NITROSTAT) 0.4 MG SL tablet Place 1 tablet (0.4 mg total) under the tongue every 5 (five) minutes as needed for chest  pain. 04/14/20   Allie Bossier, MD  ?omeprazole (PRILOSEC OTC) 20 MG tablet Take 20 mg by mouth daily.    [provider]  ?Ranibizumab (LUCENTIS IO) Inject 1 Dose into the eye every 6 (six) weeks. Bilateral eye by Dr Gerarda Fraction    [provider]  ?tamsulosin (FLOMAX) 0.4 MG CAPS capsule Take 0.4 mg by mouth 2 (two) times daily.    [provider]  ?vitamin E 180 MG (400 UNITS) capsule Take 40,000 Units by mouth daily.    [provider]  ?   ? ?Allergies    ?Albumin (human), Polymyxin b-trimethoprim, Pseudoephedrine, Codeine, Guaiacol, Statins, Gabapentin, Meloxicam, Peppermint flavor, Pseudoephedrine-guaifenesin, Rosuvastatin calcium, Tapentadol, Ciprofloxacin, Moxifloxacin, and Rofecoxib   ? ?Review of Systems   ?Review of Systems  ?Constitutional:  Negative for appetite change and fatigue.  ?HENT:  Negative for congestion, ear discharge and sinus pressure.   ?Eyes:  Negative for discharge.  ?Respiratory:  Positive for shortness of breath. Negative for cough.   ?Cardiovascular:  Negative for chest pain.  ?Gastrointestinal:  Negative for abdominal pain and diarrhea.  ?Genitourinary:  Negative for frequency and hematuria.  ?Musculoskeletal:  Negative for back pain.  ?Skin:  Negative for rash.  ?Neurological:  Negative for seizures and headaches.  ?Psychiatric/Behavioral:  Negative for hallucinations.   ? ?Physical Exam ?Updated Vital Signs ?BP (!) 146/66   Pulse (!) 105   Temp 97.8 ?F (36.6 ?C) (Oral)   Resp (!) 24   SpO2 96%  ?Physical Exam ?Vitals and nursing note reviewed.  ?Constitutional:   ?   Appearance: He is well-developed.  ?HENT:  ?   Head: Normocephalic.  ?   Nose: Nose normal.  ?Eyes:  ?   General: No scleral icterus. ?   Conjunctiva/sclera: Conjunctivae normal.  ?Neck:  ?   Thyroid: No thyromegaly.  ?Cardiovascular:  ?   Rate and Rhythm: Normal rate and regular rhythm.  ?   Heart sounds: No murmur heard. ?  No friction rub. No gallop.  ?Pulmonary:  ?    Breath sounds: No stridor. No wheezing or rales.  ?Chest:  ?   Chest wall: No tenderness.  ?Abdominal:  ?   General: There is no distension.  ?   Tenderness: There is no abdominal tenderness. There is no rebound.  ?Musculoskeletal:     ?   General: Normal range of motion.  ?   Cervical back: Neck supple.  ?Lymphadenopathy:  ?   Cervical: No cervical adenopathy.  ?Skin: ?   Findings: No erythema or rash.  ?Neurological:  ?   Mental Status: He is alert and oriented to person, place, and time.  ?   Motor: No abnormal muscle tone.  ?   Coordination: Coordination normal.  ?Psychiatric:     ?   Behavior: Behavior normal.  ? ? ?ED Results /  Procedures / Treatments   ?Labs ?(all labs ordered are listed, but only abnormal results are displayed) ?Labs Reviewed  ?CBC WITH DIFFERENTIAL/PLATELET - Abnormal; Notable for the following components:  ?    Result Value  ? RBC 2.90 (*)   ? Hemoglobin 8.7 (*)   ? HCT 25.5 (*)   ? Lymphs Abs 0.6 (*)   ? Abs Immature Granulocytes 0.31 (*)   ? All other components within normal limits  ?COMPREHENSIVE METABOLIC PANEL  ?BRAIN NATRIURETIC PEPTIDE  ?TROPONIN I (HIGH SENSITIVITY)  ?TROPONIN I (HIGH SENSITIVITY)  ? ? ?EKG ?EKG Interpretation ? ?Date/Time:  Friday January 16 2022 20:52:22 EDT ?Ventricular Rate:  92 ?PR Interval:    ?QRS Duration: 115 ?QT Interval:  364 ?QTC Calculation: 451 ?R Axis:   22 ?Text Interpretation: Atrial fibrillation Right bundle branch block Low voltage, precordial leads Confirmed by Milton Ferguson [id number]) on 01/16/2022 9:23:11 PM ? ?Radiology ?DG Chest Port 1 View ? ?Result Date: 01/16/2022 ?CLINICAL DATA:  Chest pain, shortness of breath. EXAM: PORTABLE CHEST 1 VIEW COMPARISON:  None. FINDINGS: The heart is enlarged the pulmonary vasculature is distended. Atherosclerotic calcification of the aorta is noted. Interstitial prominence is noted bilaterally with mild airspace disease at the lung bases. Small bilateral pleural effusions are noted. Sternotomy wires are noted  over the midline and cervical spinal fusion hardware is noted. IMPRESSION: 1. Cardiomegaly with pulmonary vascular congestion. 2. Increased interstitial prominence bilaterally with mild airspace disease at the lung

## 2022-01-16 NOTE — ED Triage Notes (Signed)
Pt presents to ED, states he was admitted 04/09 and since release has been feeling SOB. Hx prostate cancer, currently undergoing radiation tx.  ?

## 2022-01-16 NOTE — ED Provider Triage Note (Signed)
Emergency Medicine Provider Triage Evaluation Note ? ?Ryan [name] , a 76 y.o. male  was evaluated in triage.  Pt complains of chest pain and shortness of breath.  Recently discharged from the hospital yesterday.  States that since being discharged his symptoms continue to worsen.  Denies any nausea, vomiting,, diarrhea, or abdominal pain.  States that he was monitoring his oxygen saturations at home and they dropped 80% so he came to the emergency department for further evaluation. ? ?Physical Exam  ?BP (!) 151/82 (BP Location: Left Arm)   Pulse 99   Temp 97.8 ?F (36.6 ?C) (Oral)   Resp 17   SpO2 (!) 89%  ?Gen:   Awake, no distress   ?Resp:  Normal effort  ?MSK:   Moves extremities without difficulty  ?Other:  Tachypneic.  O2 saturations around 90% on room air.  Appears to be in respiratory distress. ? ?Medical Decision Making  ?Medically screening exam initiated at 8:45 PM.  Appropriate orders placed.  Ryan [name] was informed that the remainder of the evaluation will be completed by another provider, this initial triage assessment does not replace that evaluation, and the importance of remaining in the ED until their evaluation is complete. ? ?Acute respiratory distress.  Will obtain basic cardiac work-up as well as a BNP.  Priority patient. ?  ?Rayna Sexton, PA-C ?01/16/22 2046 ? ?

## 2022-01-16 NOTE — ED Provider Notes (Signed)
Pt care assumed 2330.  Pt with hx/o COPD, CHF, afib, metastatic prostate cancer with with sob.  He was discharged two days ago following hospitalization with V/D and dehydration.  At time of hospital discharge his lasix was held.  Since discharge he has developed increased edema, sob and new oxygen requirement of 2L [place] to maintain sats (sats in 80s without supplemental oxygen).  Concern for CHF/volume overload.  He has been treated with lasix.  Care assumed pending.  CMP, BNP and re-assessment.   ? ?BNP is slightly increased when compared to prior.  CMP with mild hypokalemia, otherwise unremarkable.  At bedside assessment patient complains of severe left-sided chest pain that has been constant since 1 PM.  His troponin is within normal limits.  Given his persistent pain and extensive medical history concern for PE versus pneumonia.  He was started on empiric antibiotics and CTA was obtained. ? ?CTA is negative for PE.  There is evidence of infiltrate versus pulmonary edema.  Plan to admit for ongoing treatment given his hypoxia and symptoms.  Medicine consulted for admission. ?  ?Quintella Reichert, MD ?01/17/22 [phone] ? ?

## 2022-01-17 ENCOUNTER — Emergency Department (HOSPITAL_COMMUNITY): Payer: Medicare Other

## 2022-01-17 DIAGNOSIS — I5033 Acute on chronic diastolic (congestive) heart failure: Secondary | ICD-10-CM

## 2022-01-17 DIAGNOSIS — R072 Precordial pain: Secondary | ICD-10-CM | POA: Diagnosis present

## 2022-01-17 DIAGNOSIS — I10 Essential (primary) hypertension: Secondary | ICD-10-CM | POA: Diagnosis not present

## 2022-01-17 DIAGNOSIS — I480 Paroxysmal atrial fibrillation: Secondary | ICD-10-CM

## 2022-01-17 DIAGNOSIS — E611 Iron deficiency: Secondary | ICD-10-CM | POA: Diagnosis present

## 2022-01-17 DIAGNOSIS — R112 Nausea with vomiting, unspecified: Secondary | ICD-10-CM | POA: Diagnosis not present

## 2022-01-17 DIAGNOSIS — I482 Chronic atrial fibrillation, unspecified: Secondary | ICD-10-CM | POA: Diagnosis not present

## 2022-01-17 DIAGNOSIS — Z951 Presence of aortocoronary bypass graft: Secondary | ICD-10-CM | POA: Diagnosis not present

## 2022-01-17 DIAGNOSIS — J9811 Atelectasis: Secondary | ICD-10-CM | POA: Diagnosis not present

## 2022-01-17 DIAGNOSIS — J9601 Acute respiratory failure with hypoxia: Secondary | ICD-10-CM | POA: Diagnosis present

## 2022-01-17 DIAGNOSIS — E876 Hypokalemia: Secondary | ICD-10-CM | POA: Diagnosis present

## 2022-01-17 DIAGNOSIS — C61 Malignant neoplasm of prostate: Secondary | ICD-10-CM

## 2022-01-17 DIAGNOSIS — E782 Mixed hyperlipidemia: Secondary | ICD-10-CM

## 2022-01-17 DIAGNOSIS — K520 Gastroenteritis and colitis due to radiation: Secondary | ICD-10-CM | POA: Diagnosis present

## 2022-01-17 DIAGNOSIS — I130 Hypertensive heart and chronic kidney disease with heart failure and stage 1 through stage 4 chronic kidney disease, or unspecified chronic kidney disease: Secondary | ICD-10-CM | POA: Diagnosis present

## 2022-01-17 DIAGNOSIS — Z79899 Other long term (current) drug therapy: Secondary | ICD-10-CM | POA: Diagnosis not present

## 2022-01-17 DIAGNOSIS — I5030 Unspecified diastolic (congestive) heart failure: Secondary | ICD-10-CM | POA: Diagnosis present

## 2022-01-17 DIAGNOSIS — R042 Hemoptysis: Secondary | ICD-10-CM | POA: Diagnosis not present

## 2022-01-17 DIAGNOSIS — H3530 Unspecified macular degeneration: Secondary | ICD-10-CM | POA: Diagnosis present

## 2022-01-17 DIAGNOSIS — I2510 Atherosclerotic heart disease of native coronary artery without angina pectoris: Secondary | ICD-10-CM

## 2022-01-17 DIAGNOSIS — Z8249 Family history of ischemic heart disease and other diseases of the circulatory system: Secondary | ICD-10-CM | POA: Diagnosis not present

## 2022-01-17 DIAGNOSIS — J90 Pleural effusion, not elsewhere classified: Secondary | ICD-10-CM | POA: Diagnosis not present

## 2022-01-17 DIAGNOSIS — Z8042 Family history of malignant neoplasm of prostate: Secondary | ICD-10-CM | POA: Diagnosis not present

## 2022-01-17 DIAGNOSIS — Z7901 Long term (current) use of anticoagulants: Secondary | ICD-10-CM | POA: Diagnosis not present

## 2022-01-17 DIAGNOSIS — I4891 Unspecified atrial fibrillation: Secondary | ICD-10-CM | POA: Diagnosis not present

## 2022-01-17 DIAGNOSIS — Z86010 Personal history of colonic polyps: Secondary | ICD-10-CM | POA: Diagnosis not present

## 2022-01-17 DIAGNOSIS — Z7982 Long term (current) use of aspirin: Secondary | ICD-10-CM | POA: Diagnosis not present

## 2022-01-17 DIAGNOSIS — Z825 Family history of asthma and other chronic lower respiratory diseases: Secondary | ICD-10-CM | POA: Diagnosis not present

## 2022-01-17 DIAGNOSIS — Y842 Radiological procedure and radiotherapy as the cause of abnormal reaction of the patient, or of later complication, without mention of misadventure at the time of the procedure: Secondary | ICD-10-CM | POA: Diagnosis present

## 2022-01-17 DIAGNOSIS — Z8673 Personal history of transient ischemic attack (TIA), and cerebral infarction without residual deficits: Secondary | ICD-10-CM | POA: Diagnosis not present

## 2022-01-17 DIAGNOSIS — I959 Hypotension, unspecified: Secondary | ICD-10-CM | POA: Diagnosis present

## 2022-01-17 DIAGNOSIS — I509 Heart failure, unspecified: Secondary | ICD-10-CM | POA: Diagnosis not present

## 2022-01-17 DIAGNOSIS — J439 Emphysema, unspecified: Secondary | ICD-10-CM | POA: Diagnosis not present

## 2022-01-17 DIAGNOSIS — K52832 Lymphocytic colitis: Secondary | ICD-10-CM

## 2022-01-17 DIAGNOSIS — R197 Diarrhea, unspecified: Secondary | ICD-10-CM | POA: Diagnosis not present

## 2022-01-17 DIAGNOSIS — K219 Gastro-esophageal reflux disease without esophagitis: Secondary | ICD-10-CM | POA: Diagnosis present

## 2022-01-17 DIAGNOSIS — N183 Chronic kidney disease, stage 3 unspecified: Secondary | ICD-10-CM | POA: Diagnosis present

## 2022-01-17 LAB — BASIC METABOLIC PANEL
Anion gap: 10 (ref 5–15)
BUN: 16 mg/dL (ref 8–23)
CO2: 26 mmol/L (ref 22–32)
Calcium: 8.7 mg/dL — ABNORMAL LOW (ref 8.9–10.3)
Chloride: 105 mmol/L (ref 98–111)
Creatinine, Ser: 0.82 mg/dL (ref 0.61–1.24)
GFR, Estimated: >60 mL/min (ref >60)
Glucose, Bld: 94 mg/dL (ref 70–99)
Potassium: 3.3 mmol/L — ABNORMAL LOW (ref 3.5–5.1)
Sodium: 141 mmol/L (ref 135–145)

## 2022-01-17 LAB — LACTIC ACID, PLASMA
Lactic Acid, Venous: 1.2 mmol/L (ref 0.5–1.9)
Lactic Acid, Venous: 1.3 mmol/L (ref 0.5–1.9)

## 2022-01-17 LAB — CBC
HCT: 26 % — ABNORMAL LOW (ref 39.0–52.0)
Hemoglobin: 8.9 g/dL — ABNORMAL LOW (ref 13.0–17.0)
MCH: 30.2 pg (ref 26.0–34.0)
MCHC: 34.2 g/dL (ref 30.0–36.0)
MCV: 88.1 fL (ref 80.0–100.0)
Platelets: 308 10*3/uL (ref 150–400)
RBC: 2.95 MIL/uL — ABNORMAL LOW (ref 4.22–5.81)
RDW: 14.7 % (ref 11.5–15.5)
WBC: 7.1 10*3/uL (ref 4.0–10.5)
nRBC: 0.4 % — ABNORMAL HIGH (ref 0.0–0.2)

## 2022-01-17 LAB — TROPONIN I (HIGH SENSITIVITY): Troponin I (High Sensitivity): 12 ng/L (ref <18)

## 2022-01-17 LAB — MRSA NEXT GEN BY PCR, NASAL: MRSA by PCR Next Gen: NOT DETECTED

## 2022-01-17 LAB — MAGNESIUM: Magnesium: 1.1 mg/dL — ABNORMAL LOW (ref 1.7–2.4)

## 2022-01-17 MED ORDER — METOPROLOL TARTRATE 25 MG PO TABS
25.0000 mg | ORAL_TABLET | Freq: Two times a day (BID) | ORAL | Status: DC
  Administered 2022-01-17 – 2022-01-20 (×8): 25 mg via ORAL
  Filled 2022-01-17 (×9): qty 1

## 2022-01-17 MED ORDER — FUROSEMIDE 10 MG/ML IJ SOLN: 40.0000 mg | Freq: Two times a day (BID) | INTRAMUSCULAR | Status: DC

## 2022-01-17 MED ORDER — MAGNESIUM SULFATE 4 GM/100ML IV SOLN
4.0000 g | Freq: Once | INTRAVENOUS | Status: AC
  Administered 2022-01-17: 4 g via INTRAVENOUS
  Filled 2022-01-17: qty 100

## 2022-01-17 MED ORDER — OMEPRAZOLE MAGNESIUM 20 MG PO TBEC: 20.0000 mg | DELAYED_RELEASE_TABLET | Freq: Every day | ORAL | Status: DC

## 2022-01-17 MED ORDER — LATANOPROST 0.005 % OP SOLN
1.0000 [drp] | Freq: Every day | OPHTHALMIC | Status: DC
  Administered 2022-01-17 – 2022-01-22 (×6): 1 [drp] via OPHTHALMIC
  Filled 2022-01-17: qty 2.5

## 2022-01-17 MED ORDER — FUROSEMIDE 40 MG PO TABS
40.0000 mg | ORAL_TABLET | Freq: Once | ORAL | Status: AC
  Administered 2022-01-17: 40 mg via ORAL
  Filled 2022-01-17: qty 1

## 2022-01-17 MED ORDER — POTASSIUM CHLORIDE CRYS ER 20 MEQ PO TBCR
40.0000 meq | EXTENDED_RELEASE_TABLET | Freq: Two times a day (BID) | ORAL | Status: AC
  Administered 2022-01-17 – 2022-01-18 (×3): 40 meq via ORAL
  Filled 2022-01-17 (×3): qty 2

## 2022-01-17 MED ORDER — SODIUM CHLORIDE 0.9 % IV SOLN: 250.0000 mL | INTRAVENOUS | Status: DC | PRN

## 2022-01-17 MED ORDER — SODIUM CHLORIDE 0.9 % IV SOLN
2.0000 g | Freq: Once | INTRAVENOUS | Status: AC
  Administered 2022-01-17: 2 g via INTRAVENOUS
  Filled 2022-01-17: qty 12.5

## 2022-01-17 MED ORDER — APIXABAN 5 MG PO TABS
5.0000 mg | ORAL_TABLET | Freq: Two times a day (BID) | ORAL | Status: DC
  Administered 2022-01-17 – 2022-01-23 (×13): 5 mg via ORAL
  Filled 2022-01-17 (×13): qty 1

## 2022-01-17 MED ORDER — SODIUM CHLORIDE 0.9% FLUSH: 3.0000 mL | INTRAVENOUS | Status: DC | PRN

## 2022-01-17 MED ORDER — OXYCODONE-ACETAMINOPHEN 5-325 MG PO TABS
1.0000 | ORAL_TABLET | Freq: Four times a day (QID) | ORAL | Status: DC | PRN
  Administered 2022-01-17 – 2022-01-21 (×3): 1 via ORAL
  Filled 2022-01-17 (×3): qty 1

## 2022-01-17 MED ORDER — CHLORHEXIDINE GLUCONATE CLOTH 2 % EX PADS: 6.0000 | MEDICATED_PAD | Freq: Every day | CUTANEOUS | Status: DC

## 2022-01-17 MED ORDER — LOPERAMIDE HCL 2 MG PO CAPS: 2.0000 mg | ORAL_CAPSULE | Freq: Four times a day (QID) | ORAL | Status: DC | PRN

## 2022-01-17 MED ORDER — POTASSIUM CHLORIDE CRYS ER 20 MEQ PO TBCR: 40.0000 meq | EXTENDED_RELEASE_TABLET | Freq: Two times a day (BID) | ORAL | Status: DC

## 2022-01-17 MED ORDER — IOHEXOL 350 MG/ML SOLN
80.0000 mL | Freq: Once | INTRAVENOUS | Status: AC | PRN
  Administered 2022-01-17: 80 mL via INTRAVENOUS

## 2022-01-17 MED ORDER — MUPIROCIN 2 % EX OINT: 1.0000 "application " | TOPICAL_OINTMENT | Freq: Two times a day (BID) | CUTANEOUS | Status: DC

## 2022-01-17 MED ORDER — MORPHINE SULFATE (PF) 2 MG/ML IV SOLN
2.0000 mg | INTRAVENOUS | Status: DC | PRN
  Administered 2022-01-17 – 2022-01-23 (×8): 2 mg via INTRAVENOUS
  Filled 2022-01-17 (×8): qty 1

## 2022-01-17 MED ORDER — VANCOMYCIN HCL 1750 MG/350ML IV SOLN
1750.0000 mg | Freq: Once | INTRAVENOUS | Status: AC
  Administered 2022-01-17: 1750 mg via INTRAVENOUS
  Filled 2022-01-17: qty 350

## 2022-01-17 MED ORDER — FERROUS FUMARATE 324 (106 FE) MG PO TABS
106.0000 mg | ORAL_TABLET | Freq: Every day | ORAL | Status: DC
  Administered 2022-01-17: 106 mg via ORAL
  Filled 2022-01-17 (×6): qty 1

## 2022-01-17 MED ORDER — ONDANSETRON HCL 4 MG/2ML IJ SOLN: 4.0000 mg | Freq: Four times a day (QID) | INTRAMUSCULAR | Status: DC | PRN

## 2022-01-17 MED ORDER — PANTOPRAZOLE SODIUM 40 MG PO TBEC
40.0000 mg | DELAYED_RELEASE_TABLET | Freq: Every day | ORAL | Status: DC
  Administered 2022-01-17 – 2022-01-23 (×7): 40 mg via ORAL
  Filled 2022-01-17 (×7): qty 1

## 2022-01-17 MED ORDER — TAMSULOSIN HCL 0.4 MG PO CAPS
0.4000 mg | ORAL_CAPSULE | Freq: Two times a day (BID) | ORAL | Status: DC
  Administered 2022-01-17 – 2022-01-23 (×13): 0.4 mg via ORAL
  Filled 2022-01-17 (×13): qty 1

## 2022-01-17 MED ORDER — ACETAMINOPHEN 325 MG PO TABS: 650.0000 mg | ORAL_TABLET | ORAL | Status: DC | PRN

## 2022-01-17 MED ORDER — LEVALBUTEROL HCL 0.63 MG/3ML IN NEBU: 0.6300 mg | INHALATION_SOLUTION | Freq: Four times a day (QID) | RESPIRATORY_TRACT | Status: DC | PRN

## 2022-01-17 MED ORDER — MAGNESIUM OXIDE -MG SUPPLEMENT 400 (240 MG) MG PO TABS
400.0000 mg | ORAL_TABLET | Freq: Two times a day (BID) | ORAL | Status: DC
  Administered 2022-01-17 – 2022-01-23 (×12): 400 mg via ORAL
  Filled 2022-01-17 (×12): qty 1

## 2022-01-17 MED ORDER — BUDESONIDE 3 MG PO CPEP
9.0000 mg | ORAL_CAPSULE | Freq: Every day | ORAL | Status: DC
  Administered 2022-01-17 – 2022-01-23 (×7): 9 mg via ORAL
  Filled 2022-01-17 (×7): qty 3

## 2022-01-17 MED ORDER — BRIMONIDINE TARTRATE 0.2 % OP SOLN
1.0000 [drp] | Freq: Two times a day (BID) | OPHTHALMIC | Status: DC
  Administered 2022-01-17 – 2022-01-23 (×12): 1 [drp] via OPHTHALMIC
  Filled 2022-01-17: qty 5

## 2022-01-17 MED ORDER — SODIUM CHLORIDE 0.9% FLUSH
3.0000 mL | Freq: Two times a day (BID) | INTRAVENOUS | Status: DC
  Administered 2022-01-17 – 2022-01-23 (×13): 3 mL via INTRAVENOUS

## 2022-01-17 MED ORDER — MORPHINE SULFATE (PF) 4 MG/ML IV SOLN
4.0000 mg | Freq: Once | INTRAVENOUS | Status: AC
  Administered 2022-01-17: 4 mg via INTRAVENOUS
  Filled 2022-01-17: qty 1

## 2022-01-17 MED ORDER — CLOBETASOL PROPIONATE 0.05 % EX CREA
TOPICAL_CREAM | Freq: Two times a day (BID) | CUTANEOUS | Status: DC | PRN
  Filled 2022-01-17: qty 15

## 2022-01-17 MED ORDER — ASPIRIN EC 81 MG PO TBEC
81.0000 mg | DELAYED_RELEASE_TABLET | Freq: Every day | ORAL | Status: DC
  Administered 2022-01-17 – 2022-01-23 (×7): 81 mg via ORAL
  Filled 2022-01-17 (×7): qty 1

## 2022-01-17 MED ORDER — FUROSEMIDE 10 MG/ML IJ SOLN
40.0000 mg | Freq: Once | INTRAMUSCULAR | Status: AC
  Administered 2022-01-17: 40 mg via INTRAVENOUS
  Filled 2022-01-17: qty 4

## 2022-01-17 MED ORDER — SODIUM CHLORIDE (PF) 0.9 % IJ SOLN
INTRAMUSCULAR | Status: AC
Start: 2022-01-17 — End: 2022-01-17
  Filled 2022-01-17: qty 50

## 2022-01-17 NOTE — Plan of Care (Signed)

## 2022-01-17 NOTE — ED Notes (Signed)
Admitting MD - Tamala Julian in assessing patient at this time. JRPRN ?

## 2022-01-17 NOTE — Progress Notes (Signed)
A consult was received from an ED physician for Vancomcyin per pharmacy dosing.  The patient's profile has been reviewed for ht/wt/allergies/indication/available labs.   ?A one time order has been placed for vancomycin 1750mg  IV.  Further antibiotics/pharmacy consults should be ordered by admitting physician if indicated.       ?                ?Thank you, ?Netta Cedars PharmD ?01/17/2022  12:35 AM ? ?

## 2022-01-17 NOTE — H&P (Signed)
?History and Physical  ? ? ?Patient: Ryan [name] OXB:[id number] DOB: 07/10/1946 ?DOA: 01/16/2022 ?DOS: the patient was seen and examined on 01/17/2022 ?PCP: [name], MD  ?Patient coming from: Home ? ?Chief Complaint:  ?Chief Complaint  ?Patient presents with  ? Shortness of Breath  ? ?HPI: Ryan [name] is a 76 y.o. male with medical history significant of chronic diastolic congestive heart failure, atrial fibrillation on Eliquis, CAD s/p CABG, COPD, history of lymphocytic/microscopic colitis, and prostate cancer undergoing radiation therapy who presents with complaints of shortness of breath.  Hospitalized at [facility] from 4/9-4/13 after presenting with complaints of nausea, vomiting, diarrhea initially meeting SIRS criteria started on empiric antibiotics.  Work-up did not reveal clear source.  GI was consulted and started patient on budesonide.  C. difficile studies are negative, and no results available from GI panel that was collected on 4/11.  There was a question of possible radiation-induced colitis.  He was seen by radiation oncology and treatments were held plans to resume once patient stable.  Lasix had been held during his hospitalization, but it appears recommended to resume at discharge.  Since being home patient reports taking at least 1 dose of Lasix.  However, yesterday developed acute onset of shortness of breath with tightness across his chest and complains of left-sided chest pain.  He notes that he had been having some swelling in his legs.  Ever since he had been started on antibiotics he had been having redness of his nose and cheeks that they thought was possibly related to the antibiotics.  He notes that the rash does not itch. ? ? ?Upon admission into the emergency department patient was seen to be afebrile with pulse 79-126, respiration 15-40, blood pressures maintained, and O2 saturations noted a in the 87% with improvement on 2 L of nasal cannula oxygen to greater than 92%.   Chest x-ray noted cardiomegaly with pulmonary vascular congestion and increased interstitial prominence bilaterally concerning for possible edema or infiltrate with small bilateral pleural effusions.  Labs from 4/14 significant for WBC 7, hemoglobin 8.7, potassium 3.4, BNP 605.1, high-sensitivity troponins negative x2,  lactic acid 1.2, and magnesium 1.1.  CT angiogram of the chest showed no signs of a pulmonary embolus with hazy groundglass opacities in the lungs bilaterally concerning for edema or infiltrate and small to moderate bilateral pleural effusions.  Blood cultures were obtained.  Patient has been given Lasix 40 mg IV, Lasix 20 mg p.o., vancomycin, cefepime, and morphine. ? ?Review of Systems: As mentioned in the history of present illness. All other systems reviewed and are negative. ?Past Medical History:  ?Diagnosis Date  ? Anemia   ? Anticoagulant long-term use   ? eliquis--- mananged by cardiology  ? BPH with urinary obstruction   ? CAD (coronary artery disease)   ? cardiologist--- dr g. taylor--- cath 04-22-2011  moderate LM stenosis, borderline sig pRCA, sig stenosis mid to distal LAD ;  aggressive medical therapy;   cath 07-21-2021 for chest pain w/ ST changes,  severe disease;  s/p CABG x4 07-22-2021  ? Chronic diastolic CHF (congestive heart failure) ([place]) 03/2018  ? followed by cardiology  ? CKD (chronic kidney disease), stage III ([place])   ? COPD (chronic obstructive pulmonary disease) ([place])   ? followed by pcp  ? DOE (dyspnea on exertion)   ? per pt when walk >100yds,  per pt rides outside bike 4-8 miles daily without sob,  household chores and yard work without sob  ?  Eczema   ? GERD (gastroesophageal reflux disease)   ? Glaucoma, left eye   ? Heart murmur   ? Hemorrhoids   ? History of adenomatous polyp of colon   ? History of coma 1962  ? 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 History of kidney stones   ? History of rheumatic fever as a child   ? History of syncope  02/2014  ? in setting AFlutter w/ RVR of known PAF admission in epic  ? History of transient ischemic attack (TIA) 03/23/2021  ? 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 History of urinary retention   ? Hypertension   ? Lumbar radiculopathy   ? per pt with left hip/ leg pain  ? Lymphocytic colitis   ? followed by dr v. Bryan Lemma--- dx by biopsy 01-28-2021  ? Macular degeneration of both eyes   ? followed by dr c. Cordelia Pen--- bilateral eye injection every 6 wks  ? Malignant neoplasm prostate [facility]) 07/2021  ? urologist-- dr Diona Fanti;  dx 10/ 2022,  Gleason 5+5, PSA 1.8  ? Osteoporosis 05/31/2016  ? PAF (paroxysmal atrial fibrillation) ([place]) 03/2012  ? cardiologist-- dr g. taylor;  first dx 06/ 2013 AFlutter w/ RVR  ? Vitamin D deficiency   ? ?Past Surgical History:  ?Procedure Laterality Date  ? ANTERIOR CERVICAL DECOMP/DISCECTOMY FUSION  06/08/2002  ? @[facility] ;  C5--C7  ? APPENDECTOMY  1953  ? CARDIAC CATHETERIZATION  04/22/2011  ? moderate left main and RCA stenosis not significant by FFR and IVUS on medical therapy  ? CARDIOVERSION N/A 04/25/2018  ? Procedure: CARDIOVERSION;  Surgeon: Skeet Latch, MD;  Location: [place];  Service: Cardiovascular;  Laterality: N/A;  ? CATARACT EXTRACTION W/ INTRAOCULAR LENS IMPLANT Bilateral 2021  ? COLONOSCOPY WITH ESOPHAGOGASTRODUODENOSCOPY (EGD)  01/28/2021  ? by [facility]  ? CORONARY ARTERY BYPASS GRAFT N/A 07/22/2021  ? Procedure: 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, ON PUMP, USING LEFT INTERNAL MAMMARY ARTERY AND ENDOSCOPICALLY HARVESTED RIGHT GREATER SAPHENOUS VEIN;  Surgeon: Melrose Nakayama, MD;  Location: [facility];  Service: Open Heart Surgery;  Laterality: N/A;  ? ENDOVEIN HARVEST OF GREATER SAPHENOUS VEIN  07/22/2021  ? Procedure: ENDOVEIN HARVEST OF GREATER SAPHENOUS VEIN;  Surgeon: Melrose Nakayama, MD;  Location: [facility];  Service: Open Heart  Surgery;;  ? EXTRACORPOREAL SHOCK WAVE LITHOTRIPSY    ? x2  1990s  ? FOOT SURGERY Right 1970  ? calcification removed from top of foot  ? GOLD SEED IMPLANT N/A 11/28/2021  ? Procedure: GOLD SEED IMPLANT;  Surgeon: Festus Aloe, MD;  Location[facility];  Service: Urology;  Laterality: N/A;  ? KNEE ARTHROSCOPY Right 1991  ? LAPAROSCOPIC CHOLECYSTECTOMY  2000  ? LEFT HEART CATH AND CORONARY ANGIOGRAPHY N/A 07/21/2021  ? Procedure: LEFT HEART CATH AND CORONARY ANGIOGRAPHY;  Surgeon: Lorretta Harp, MD;  Location: [facility];  Service: Cardiovascular;  Laterality: N/A;  ? SPACE OAR INSTILLATION N/A 11/28/2021  ? Procedure: SPACE OAR INSTILLATION;  Surgeon: Festus Aloe, MD;  Location: [facility];  Service: Urology;  Laterality: N/A;  ? TEE WITHOUT CARDIOVERSION N/A 07/22/2021  ? Procedure: TRANSESOPHAGEAL ECHOCARDIOGRAM (TEE);  Surgeon: Melrose Nakayama, MD;  Location: [place];  Service: Open Heart Surgery;  Laterality: N/A;  ? TOTAL KNEE ARTHROPLASTY Right 08/14/2009  ? @[facility]   ?  TRANSURETHRAL RESECTION OF PROSTATE N/A 07/17/2021  ? Procedure: TRANSURETHRAL RESECTION OF THE PROSTATE (TURP);  Surgeon: [name], MD;  Location: [facility];  Service: Urology;  Laterality: N/A;  1 HR  ? ?Social History:  reports that he quit smoking about 12 years ago. His smoking use included cigarettes. He has a 25.00 pack-year smoking history. He quit smokeless tobacco use about 28 years ago.  His smokeless tobacco use included chew. He reports current alcohol use of about 2.0 standard drinks per week. He reports that he does not use drugs. ? ?Allergies  ?Allergen Reactions  ? Albumin (Human) Anaphylaxis  ? Polymyxin B-Trimethoprim Swelling  ?  Eye drops made eyes swell  ? Pseudoephedrine Other (See Comments)  ?  Stomach cramps  ? Codeine Hives, Itching and Rash  ? Guaiacol Other (See Comments)  ?  Hallucinations  ? Statins Other (See Comments)  ?  Muscle  cramps ?  ? Gabapentin Other (See Comments)  ?  Pt unsure of sensitivity  ? Meloxicam Other (See Comments)  ?  Pt unsure of sensitivity  ? Peppermint Flavor Other (See Comments)  ?  Severe cramping  ? Pseu

## 2022-01-18 DIAGNOSIS — R042 Hemoptysis: Secondary | ICD-10-CM

## 2022-01-18 LAB — BASIC METABOLIC PANEL
Anion gap: 9 (ref 5–15)
BUN: 18 mg/dL (ref 8–23)
CO2: 29 mmol/L (ref 22–32)
Calcium: 8.9 mg/dL (ref 8.9–10.3)
Chloride: 102 mmol/L (ref 98–111)
Creatinine, Ser: 0.9 mg/dL (ref 0.61–1.24)
GFR, Estimated: >60 mL/min (ref >60)
Glucose, Bld: 98 mg/dL (ref 70–99)
Potassium: 4.2 mmol/L (ref 3.5–5.1)
Sodium: 140 mmol/L (ref 135–145)

## 2022-01-18 LAB — MAGNESIUM: Magnesium: 1.8 mg/dL (ref 1.7–2.4)

## 2022-01-18 MED ORDER — FUROSEMIDE 10 MG/ML IJ SOLN
20.0000 mg | Freq: Every day | INTRAMUSCULAR | Status: DC
  Administered 2022-01-18 – 2022-01-19 (×2): 20 mg via INTRAVENOUS
  Filled 2022-01-18 (×5): qty 2

## 2022-01-18 NOTE — Assessment & Plan Note (Signed)
Improving slowly, ?Exacerbated by pulm edema ?Likely upper airway inflammation, low threshold for Angiography Chest given current blood thinner. If worsens hold eliquis (on for Afib) ?

## 2022-01-18 NOTE — Assessment & Plan Note (Addendum)
Patient presents with complaints of shortness of breath found to be hypoxic as low as 87% with improvement on 2 to 3 L of nasal cannula oxygen to greater than 92%.  Chest x-ray noted concern for increased interstitial opacities concerning for edema with small bilateral pleural effusions.  BNP was elevated at 605.  Recently had echocardiogram on 01/13/2022 which noted EF of 55-60%, but left diastolic function could not be fully evaluated.  Patient had been given Lasix 40 mg IV and 20 mg p.o in the ED. Suspect this is secondary to recent hospitalization with nausea and vomiting where patient was given IV fluids and diuretics held. ? -Heart failure order set utilized ?-Continuous pulse oximetry with nasal cannula oxygen to maintain O2 saturation greater than 92%. ?-Strict I&Os and daily weight ?-Will continue daily gentle diruesis, encourage OOB as tolerated TID and mobilization. Would like to fluid restrict as tolerated for best outcome.  ?-Continue beta-blocker ?Continue gentle diuresis as tolerated ?

## 2022-01-18 NOTE — Progress Notes (Signed)
?Progress Note ? ? ?Patient: Ryan [name] VWP:[id number] DOB: 1945-11-02 DOA: 01/16/2022     1 ?DOS: the patient was seen and examined on 01/18/2022 ?  ?Brief hospital course: ?No notes on file ? ?Assessment and Plan: ?Heart failure with preserved ejection fraction ([place]) ?Patient presents with complaints of shortness of breath found to be hypoxic as low as 87% with improvement on 2 to 3 L of nasal cannula oxygen to greater than 92%.  Chest x-ray noted concern for increased interstitial opacities concerning for edema with small bilateral pleural effusions.  BNP was elevated at 605.  Recently had echocardiogram on 01/13/2022 which noted EF of 55-60%, but left diastolic function could not be fully evaluated.  Patient had been given Lasix 40 mg IV and 20 mg p.o in the ED. Suspect this is secondary to recent hospitalization with nausea and vomiting where patient was given IV fluids and diuretics held. ? -Heart failure order set utilized ?-Continuous pulse oximetry with nasal cannula oxygen to maintain O2 saturation greater than 92%. ?-Strict I&Os and daily weight ?-Will continue daily gentle diruesis, encourage OOB as tolerated TID and mobilization. Would like to fluid restrict as tolerated for best outcome.  ?-Continue beta-blocker ?Continue gentle diuresis as tolerated ? ?Nausea, vomiting, and diarrhea ?Resolved with budesonide treatment. ? ? ?Prostate cancer ([place]) ?Followed by Dr. Eulogio Ditch and Dr. Tammi Klippel.  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 ?-Notify Dr. Tammi Klippel if patient wanting prolonged hospitalization ? ?Blood-streaked sputum ?Improving slowly, ?Exacerbated by pulm edema ?Likely upper airway inflammation, low threshold for Angiography Chest given current blood thinner. If worsens hold eliquis (on for Afib) ? ? ? ? ?  ? ?Subjective: SOB improving, +productive cough brown slightly blood streaked, but no clots noted ?Eager to know if prostate treatment will be delayed given  his hospitalization. ? ? ?Physical Exam: ?Vitals:  ? 01/17/22 1549 01/17/22 1557 01/18/22 0304 01/18/22 1224  ?BP: (!) 153/77  (!) 147/73 125/65  ?Pulse: 90  (!) 109 82  ?Resp: 18   (!) 22  ?Temp: 98 ?F (36.7 ?C)  97.9 ?F (36.6 ?C) 98.3 ?F (36.8 ?C)  ?TempSrc: Oral  Oral Oral  ?SpO2: 94%  95% 97%  ?Weight:  74 kg 69.4 kg   ?Height:  5\' 4"  (1.626 m)    ?Elderly male who appears chronically ill ?Eyes: PERRL, injectable injection of the left eye with mild lid swelling ?ENMT: Mucous membranes are moist.  Posterior pharynx clear of any exudate or lesions.  ?Neck: normal, supple.  Mild JVD appreciated. ?Respiratory: Mildly tachypneic with decreased breath sounds and positive crackles appreciated. ?Cardiovascular: Irregular irregular 1+ pitting lower extremity edema. 2+ pedal pulses. No carotid bruits.  ?Abdomen: no tenderness, no masses palpated. No hepatosplenomegaly. Bowel sounds positive.  ?Musculoskeletal: no clubbing / cyanosis. No joint deformity upper and lower extremities. Good ROM, no contractures. Normal muscle tone.  ?Skin: Erythema and of the bridge of the nose and bilateral cheeks ?Neurologic: CN 2-12 grossly intact.  Able to move all extremities. ?Psychiatric: Normal judgment and insight. Alert and oriented x 3.  Depressed mood.  ?Data Reviewed: ? ?Results for orders placed or performed during the hospital encounter of 01/16/22 (from the past 24 hour(s))  ?Lactic acid, plasma     Status: None  ? Collection Time: 01/17/22  7:02 PM  ?Result Value Ref Range  ? Lactic Acid, Venous 1.3 0.5 - 1.9 mmol/L  ?Basic metabolic panel     Status: None  ? Collection Time:  01/18/22  3:55 AM  ?Result Value Ref Range  ? Sodium 140 135 - 145 mmol/L  ? Potassium 4.2 3.5 - 5.1 mmol/L  ? Chloride 102 98 - 111 mmol/L  ? CO2 29 22 - 32 mmol/L  ? Glucose, Bld 98 70 - 99 mg/dL  ? BUN 18 8 - 23 mg/dL  ? Creatinine, Ser 0.90 0.61 - 1.24 mg/dL  ? Calcium 8.9 8.9 - 10.3 mg/dL  ? GFR, Estimated >60 >60 mL/min  ? Anion gap 9 5 - 15   ? ? ? ?Family Communication: unavailable during rounds ? ?Disposition: ?Status is: Inpatient ?Remains inpatient appropriate because: SOB, dyspnea, CHF exacerbation ? Planned Discharge Destination: Home ? ? ? ?Time spent:  35 minutes ? ?Author: ?Vanna Scotland, MD ?01/18/2022 3:12 PM ? ?For on call review [email or web address].  ?

## 2022-01-18 NOTE — Hospital Course (Signed)
76 y.o. male with medical history significant of chronic diastolic congestive heart failure, atrial fibrillation on Eliquis, CAD s/p CABG, COPD, history of lymphocytic/microscopic colitis, and prostate cancer undergoing radiation therapy who presents with complaints of shortness of breath.  Hospitalized at [facility] from 4/9-4/13 after presenting with complaints of nausea, vomiting, diarrhea initially meeting SIRS criteria started on empiric antibiotics.  Work-up did not reveal clear source.  GI was consulted and started patient on budesonide.  C. difficile studies are negative, and no results available from GI panel that was collected on 4/11.  There was a question of possible radiation-induced colitis.  He was seen by radiation oncology and treatments were held plans to resume once patient stable.  Lasix had been held during his hospitalization, but it appears recommended to resume at discharge.  Since being home patient reports taking at least 1 dose of Lasix.  However, yesterday developed acute onset of shortness of breath with tightness across his chest and complains of left-sided chest pain.  He notes that he had been having some swelling in his legs.  Ever since he had been started on antibiotics he had been having redness of his nose and cheeks that they thought was possibly related to the antibiotics.  He notes that the rash does not itch. ?  ?  ?Upon admission into the emergency department patient was seen to be afebrile with pulse 79-126, respiration 15-40, blood pressures maintained, and O2 saturations noted a in the 87% with improvement on 2 L of nasal cannula oxygen to greater than 92%.  Chest x-ray noted cardiomegaly with pulmonary vascular congestion and increased interstitial prominence bilaterally concerning for possible edema or infiltrate with small bilateral pleural effusions.  Labs from 4/14 significant for WBC 7, hemoglobin 8.7, potassium 3.4, BNP 605.1, high-sensitivity troponins negative  x2,  lactic acid 1.2, and magnesium 1.1.  CT angiogram of the chest showed no signs of a pulmonary embolus with hazy groundglass opacities in the lungs bilaterally concerning for edema or infiltrate and small to moderate bilateral pleural effusions.  Blood cultures were obtained.  Patient has been given Lasix 40 mg IV, Lasix 20 mg p.o., vancomycin, cefepime, and morphine. ?  ?4/16- reports coughing up dark streaky sputum. Denies SOB. Does report productive cough, able to expectorate well. Denies CP. Output -1.5 L with gentle diuresis ?

## 2022-01-18 NOTE — Plan of Care (Signed)

## 2022-01-18 NOTE — Assessment & Plan Note (Signed)
Followed by Dr. Eulogio Ditch and Dr. Tammi Klippel.  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 ?-Notify Dr. Tammi Klippel if patient wanting prolonged hospitalization ?

## 2022-01-18 NOTE — Assessment & Plan Note (Signed)
Resolved with budesonide treatment. ? ?

## 2022-01-19 ENCOUNTER — Ambulatory Visit: Payer: Medicare Other

## 2022-01-19 DIAGNOSIS — J9601 Acute respiratory failure with hypoxia: Secondary | ICD-10-CM | POA: Diagnosis not present

## 2022-01-19 DIAGNOSIS — I10 Essential (primary) hypertension: Secondary | ICD-10-CM | POA: Diagnosis not present

## 2022-01-19 DIAGNOSIS — I2510 Atherosclerotic heart disease of native coronary artery without angina pectoris: Secondary | ICD-10-CM | POA: Diagnosis not present

## 2022-01-19 DIAGNOSIS — I482 Chronic atrial fibrillation, unspecified: Secondary | ICD-10-CM

## 2022-01-19 DIAGNOSIS — I5033 Acute on chronic diastolic (congestive) heart failure: Secondary | ICD-10-CM | POA: Diagnosis not present

## 2022-01-19 LAB — BASIC METABOLIC PANEL
Anion gap: 7 (ref 5–15)
BUN: 24 mg/dL — ABNORMAL HIGH (ref 8–23)
CO2: 30 mmol/L (ref 22–32)
Calcium: 9 mg/dL (ref 8.9–10.3)
Chloride: 102 mmol/L (ref 98–111)
Creatinine, Ser: 0.99 mg/dL (ref 0.61–1.24)
GFR, Estimated: >60 mL/min (ref >60)
Glucose, Bld: 101 mg/dL — ABNORMAL HIGH (ref 70–99)
Potassium: 4.3 mmol/L (ref 3.5–5.1)
Sodium: 139 mmol/L (ref 135–145)

## 2022-01-19 LAB — MAGNESIUM: Magnesium: 1.5 mg/dL — ABNORMAL LOW (ref 1.7–2.4)

## 2022-01-19 LAB — TSH: TSH: 2.04 u[IU]/mL (ref 0.350–4.500)

## 2022-01-19 MED ORDER — FUROSEMIDE 10 MG/ML IJ SOLN: 40.0000 mg | Freq: Every day | INTRAMUSCULAR | Status: DC

## 2022-01-19 MED ORDER — MAGNESIUM SULFATE 2 GM/50ML IV SOLN
2.0000 g | Freq: Once | INTRAVENOUS | Status: AC
  Administered 2022-01-19: 2 g via INTRAVENOUS
  Filled 2022-01-19: qty 50

## 2022-01-19 MED ORDER — FUROSEMIDE 10 MG/ML IJ SOLN: 20.0000 mg | Freq: Once | INTRAMUSCULAR | Status: DC

## 2022-01-19 MED ORDER — METOPROLOL TARTRATE 5 MG/5ML IV SOLN: 2.5000 mg | Freq: Four times a day (QID) | INTRAVENOUS | Status: DC | PRN

## 2022-01-19 MED ORDER — FUROSEMIDE 10 MG/ML IJ SOLN
40.0000 mg | Freq: Every day | INTRAMUSCULAR | Status: DC
  Administered 2022-01-20: 40 mg via INTRAVENOUS

## 2022-01-19 MED ORDER — FUROSEMIDE 10 MG/ML IJ SOLN
20.0000 mg | Freq: Once | INTRAMUSCULAR | Status: AC
  Administered 2022-01-19: 20 mg via INTRAVENOUS

## 2022-01-19 NOTE — Progress Notes (Signed)
?Progress Note ? ? ?Patient: Ryan [name] VQQ:[id number] DOB: [birth year] DOA: 01/16/2022     2 ?DOS: the patient was seen and examined on 01/19/2022 ?  ?Brief hospital course: ?76 y.o. male with medical history significant of chronic diastolic congestive heart failure, atrial fibrillation on Eliquis, CAD s/p CABG, COPD, history of lymphocytic/microscopic colitis, and prostate cancer undergoing radiation therapy who presents with complaints of shortness of breath.  Hospitalized at [facility] from 4/9-4/13 after presenting with complaints of nausea, vomiting, diarrhea initially meeting SIRS criteria started on empiric antibiotics.  Work-up did not reveal clear source.  GI was consulted and started patient on budesonide.  C. difficile studies are negative, and no results available from GI panel that was collected on 4/11.  There was a question of possible radiation-induced colitis.  He was seen by radiation oncology and treatments were held plans to resume once patient stable.  Lasix had been held during his hospitalization, but it appears recommended to resume at discharge.  Since being home patient reports taking at least 1 dose of Lasix.  However, yesterday developed acute onset of shortness of breath with tightness across his chest and complains of left-sided chest pain.  He notes that he had been having some swelling in his legs.  Ever since he had been started on antibiotics he had been having redness of his nose and cheeks that they thought was possibly related to the antibiotics.  He notes that the rash does not itch. ?  ?  ?Upon admission into the emergency department patient was seen to be afebrile with pulse 79-126, respiration 15-40, blood pressures maintained, and O2 saturations noted a in the 87% with improvement on 2 L of nasal cannula oxygen to greater than 92%.  Chest x-ray noted cardiomegaly with pulmonary vascular congestion and increased interstitial prominence bilaterally concerning for possible  edema or infiltrate with small bilateral pleural effusions.  Labs from 4/14 significant for WBC 7, hemoglobin 8.7, potassium 3.4, BNP 605.1, high-sensitivity troponins negative x2,  lactic acid 1.2, and magnesium 1.1.  CT angiogram of the chest showed no signs of a pulmonary embolus with hazy groundglass opacities in the lungs bilaterally concerning for edema or infiltrate and small to moderate bilateral pleural effusions.  Blood cultures were obtained.  Patient has been given Lasix 40 mg IV, Lasix 20 mg p.o., vancomycin, cefepime, and morphine. ?  ?4/16- reports coughing up dark streaky sputum. Denies SOB. Does report productive cough, able to expectorate well. Denies CP. Output -1.5 L with gentle diuresis ? ?Assessment and Plan: ?Heart failure with preserved ejection fraction ([place]) ?Patient presents with complaints of shortness of breath found to be hypoxic as low as 87% with improvement on 2 to 3 L of nasal cannula oxygen to greater than 92%.  Chest x-ray noted concern for increased interstitial opacities concerning for edema with small bilateral pleural effusions.  BNP was elevated at 605.  Recently had echocardiogram on 01/13/2022 which noted EF of 55-60%, but left diastolic function could not be fully evaluated.  Patient had been given Lasix 40 mg IV and 20 mg p.o in the ED. Suspect this is secondary to recent hospitalization with nausea and vomiting where patient was given IV fluids and diuretics held. ? -Heart failure order set utilized ?-Continuous pulse oximetry with nasal cannula oxygen to maintain O2 saturation greater than 92%. ?-Strict I&Os and daily weight ?-Will continue daily gentle diruesis, encourage OOB as tolerated TID and mobilization. Would like to fluid restrict as tolerated for best outcome.  ?-  Continue beta-blocker ?Continue gentle diuresis as tolerated ? ?Nausea, vomiting, and diarrhea ?Resolved with budesonide treatment. ? ? ?Prostate cancer ([place]) ?Followed by Dr. Eulogio Ditch and Dr.  Tammi Klippel.  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 ?-Notify Dr. Tammi Klippel if patient wanting prolonged hospitalization ? ?Blood-streaked sputum ?Improving slowly, ?Exacerbated by pulm edema ?Likely upper airway inflammation, low threshold for Angiography Chest given current blood thinner. If worsens hold eliquis (on for Afib) ? ? ?Worsening dyspnea ?Rule out cardiac etiology ?Cardiology consulted to assist with the management ? ?  ? ?Subjective: States his shortness of breath is worsening.  Endorses intermittent left-sided rib pain. ? ? ?Physical Exam: ?Vitals:  ? 01/19/22 0435 01/19/22 0436 01/19/22 1447 01/19/22 1614  ?BP: (!) 142/88  121/71 131/80  ?Pulse: 79  (!) 117 (!) 113  ?Resp: 20  17 (!) 22  ?Temp: 97.9 ?F (36.6 ?C)  98 ?F (36.7 ?C) 98.5 ?F (36.9 ?C)  ?TempSrc: Oral  Oral Oral  ?SpO2: 97%  97% 98%  ?Weight:  71.7 kg    ?Height:      ?Elderly male who appears chronically ill ?Eyes: PERRL, injectable injection of the left eye with mild lid swelling ?ENMT: Mucous membranes are moist.  Posterior pharynx clear of any exudate or lesions.  ?Neck: normal, supple.  Mild JVD appreciated. ?Respiratory: Mildly tachypneic with decreased breath sounds and positive crackles appreciated. ?Cardiovascular: Irregular irregular 1+ pitting lower extremity edema. 2+ pedal pulses. No carotid bruits.  ?Abdomen: no tenderness, no masses palpated. No hepatosplenomegaly. Bowel sounds positive.  ?Musculoskeletal: no clubbing / cyanosis. No joint deformity upper and lower extremities. Good ROM, no contractures. Normal muscle tone.  ?Skin: Erythema and of the bridge of the nose and bilateral cheeks ?Neurologic: CN 2-12 grossly intact.  Able to move all extremities. ?Psychiatric: Normal judgment and insight. Alert and oriented x 3.  Depressed mood.  ?Data Reviewed: ? ?Results for orders placed or performed during the hospital encounter of 01/16/22 (from the past 24 hour(s))  ?Basic metabolic panel      Status: Abnormal  ? Collection Time: 01/19/22  3:59 AM  ?Result Value Ref Range  ? Sodium 139 135 - 145 mmol/L  ? Potassium 4.3 3.5 - 5.1 mmol/L  ? Chloride 102 98 - 111 mmol/L  ? CO2 30 22 - 32 mmol/L  ? Glucose, Bld 101 (H) 70 - 99 mg/dL  ? BUN 24 (H) 8 - 23 mg/dL  ? Creatinine, Ser 0.99 0.61 - 1.24 mg/dL  ? Calcium 9.0 8.9 - 10.3 mg/dL  ? GFR, Estimated >60 >60 mL/min  ? Anion gap 7 5 - 15  ?Magnesium     Status: Abnormal  ? Collection Time: 01/19/22  3:59 AM  ?Result Value Ref Range  ? Magnesium 1.5 (L) 1.7 - 2.4 mg/dL  ?TSH     Status: None  ? Collection Time: 01/19/22  3:59 AM  ?Result Value Ref Range  ? TSH 2.040 0.350 - 4.500 uIU/mL  ? ? ? ?Family Communication: Updated his wife at bedside. ? ?Disposition: ?Status is: Inpatient ?Remains inpatient appropriate because: SOB, dyspnea, CHF exacerbation ? Planned Discharge Destination: Home ? ? ? ?Time spent:  35 minutes ? ?Author: ?Kayleen Memos, DO ?01/19/2022 5:05 PM ? ?For on call review [email or web address].  ?

## 2022-01-19 NOTE — Progress Notes (Signed)
?   01/19/22 1447  ?Assess: MEWS Score  ?Temp 98 ?F (36.7 ?C)  ?BP 121/71  ?Pulse Rate (!) 117  ?Resp 17  ?Level of Consciousness Alert  ?SpO2 97 %  ?O2 Device Nasal Cannula  ?Assess: MEWS Score  ?MEWS Temp 0  ?MEWS Systolic 0  ?MEWS Pulse 2  ?MEWS RR 0  ?MEWS LOC 0  ?MEWS Score 2  ?MEWS Score Color Yellow  ?Assess: if the MEWS score is Yellow or Red  ?Were vital signs taken at a resting state? Yes  ?Focused Assessment No change from prior assessment  ?Does the patient meet 2 or more of the SIRS criteria? Yes  ?Does the patient have a confirmed or suspected source of infection? No  ?Provider and Rapid Response Notified?  ?(MD aware)  ?MEWS guidelines implemented *See Row Information* Yes  ?Treat  ?MEWS Interventions Escalated (See documentation below)  ?Take Vital Signs  ?Increase Vital Sign Frequency  Yellow: Q 2hr X 2 then Q 4hr X 2, if remains yellow, continue Q 4hrs  ?Escalate  ?MEWS: Escalate Yellow: discuss with charge nurse/RN and consider discussing with provider and RRT  ?Notify: Charge Nurse/RN  ?Name of Charge Nurse/RN Notified Rise Paganini, RN  ?Date Charge Nurse/RN Notified 01/19/22  ?Time Charge Nurse/RN Notified 1624  ?Notify: Provider  ?Provider Name/Title Irene Pap, DO  ?Date Provider Notified 01/19/22  ?Time Provider Notified 1624  ?Assess: SIRS CRITERIA  ?SIRS Temperature  0  ?SIRS Pulse 1  ?SIRS Respirations  0  ?SIRS WBC 0  ?SIRS Score Sum  1  ? ? ?

## 2022-01-19 NOTE — Progress Notes (Signed)
Patient scheduled to receive radiation this morning.  Patient refusing radiation, saying, "I just don't have any energy."  Patient, radiation RN, and MD agreeable to reschedule radiation for tomorrow (4/18). ? ?Angie Fava, RN  ?

## 2022-01-19 NOTE — Consult Note (Signed)
?Cardiology Consultation:  ? ?Patient ID: Ryan [name] ?MRN: [id number]; DOB: 06-02-1946 ? ?Admit date: 01/16/2022 ?Date of Consult: 01/19/2022 ? ?PCP:  [name], MD ?  ?[facility] Providers ?Cardiologist:  Cristopher Peru, MD      ? ? ?Patient Profile:  ? ?Ryan [name] is a 76 y.o. male with a hx of metastatic prostate cancer, COPD, CAD s/p CABG, PAF, CKD, arthritis, chronic diastolic heart failure, and hypertension who is being seen 01/19/2022 for the evaluation of CHF at the request of Dr. [place] Crane. ? ?History of Present Illness:  ? ?Ryan [name] has a history of PAF and now chronic atrial fibrillation and was anticoagulated on Eliquis.  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 Leonie Man.  He has stopped Rythmol and is on beta-blocker for rate control.  He is minimally symptomatic with his A-fib. ? ?While hospitalized for hematuria and bladder spasms, he developed chest pain with EKG changes.  He underwent definitive angiography in 07/21/2021 that revealed a 90% stenosis in the left main, 95% proximal LAD, and 60% left circumflex.  TCTS was consulted and he underwent CABG x4 on 07/22/2021 (LIMA-LAD, SVG-OM1, SVG-acute marginal-PDA).  Postop course complicated by ileus.  He remained in rate controlled A-fib.  Preop echo showed mild cardiomyopathy with an LVEF of 45 to 50% but postop EF 50%.  Wall motion was normal. ? ?He was hospitalized 4/9 - 01/15/2022 for nausea, vomiting, and diarrhea after receiving 22 radiation treatments for prostate cancer.  He has a history of microscopic colitis and it was unclear as to whether his symptoms were related to his radiation therapy or his colitis.  He was started on empiric antibiotics for about possible GI source of infection and was started on budesonide.  Hospitalization was complicated by atrial fibrillation with RVR felt related to dehydration from diarrhea and emesis.  This was managed with Cardizem drip which was discontinued at time  of discharge and he was discharged home on metoprolol and Eliquis.  ? ?He presented back to the ER 01/16/2022 with volume overload and hypoxia in the mid 80s. While home, he took one dose of 20 mg lasix but later developed acute SOB and tightness across his chest.  BNP was 605, at bedtime troponin negative and potassium low at 3.3.  Chest x-ray was consistent with pulmonary vascular congestion and pleural effusions.  He was started on Lasix 40 mg IV x1 and then 20 mg IV x3 and is net -3.3 L since admission.  Cardiology consult heart failure.  He tells me that he has been having some tightness across his chest which is worse with taking a deep breath and coughing.  He says he never had angina prior to his diagnosis of his coronary disease.  At bedtime troponin is negative at 11 and 12.  Twelve-lead EKG reviewed from 01/16/2022 showing atrial fibrillation with occasional PVC and right bundle branch block. ? ? ?Past Medical History:  ?Diagnosis Date  ? Anemia   ? Anticoagulant long-term use   ? eliquis--- mananged by cardiology  ? BPH with urinary obstruction   ? CAD (coronary artery disease)   ? cardiologist--- dr g. taylor--- cath 04-22-2011  moderate LM stenosis, borderline sig pRCA, sig stenosis mid to distal LAD ;  aggressive medical therapy;   cath 07-21-2021 for chest pain w/ ST changes,  severe disease;  s/p CABG x4 07-22-2021  ? Chronic diastolic CHF (congestive heart failure) ([place]) 03/2018  ? followed  by cardiology  ? CKD (chronic kidney disease), stage III ([place])   ? COPD (chronic obstructive pulmonary disease) ([place])   ? followed by pcp  ? DOE (dyspnea on exertion)   ? per pt when walk >100yds,  per pt rides outside bike 4-8 miles daily without sob,  household chores and yard work without sob  ? Eczema   ? GERD (gastroesophageal reflux disease)   ? Glaucoma, left eye   ? Heart murmur   ? Hemorrhoids   ? History of adenomatous polyp of colon   ? History of coma 1962  ? 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 History of kidney stones   ? History of rheumatic fever as a child   ? History of syncope 02/2014  ? in setting AFlutter w/ RVR of known PAF admission in epic  ? History of transient ischemic attack (TIA) 03/23/2021  ? 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 History of urinary retention   ? Hypertension   ? Lumbar radiculopathy   ? per pt with left hip/ leg pain  ? Lymphocytic colitis   ? followed by dr v. Bryan Lemma--- dx by biopsy 01-28-2021  ? Macular degeneration of both eyes   ? followed by dr c. Cordelia Pen--- bilateral eye injection every 6 wks  ? Malignant neoplasm prostate [facility]) 07/2021  ? urologist-- dr Diona Fanti;  dx 10/ 2022,  Gleason 5+5, PSA 1.8  ? Osteoporosis 05/31/2016  ? PAF (paroxysmal atrial fibrillation) ([place]) 03/2012  ? cardiologist-- dr g. taylor;  first dx 06/ 2013 AFlutter w/ RVR  ? Vitamin D deficiency   ? ? ?Past Surgical History:  ?Procedure Laterality Date  ? ANTERIOR CERVICAL DECOMP/DISCECTOMY FUSION  06/08/2002  ? @[facility] ;  C5--C7  ? APPENDECTOMY  1953  ? CARDIAC CATHETERIZATION  04/22/2011  ? moderate left main and RCA stenosis not significant by FFR and IVUS on medical therapy  ? CARDIOVERSION N/A 04/25/2018  ? Procedure: CARDIOVERSION;  Surgeon: Skeet Latch, MD;  Location: [facility];  Service: Cardiovascular;  Laterality: N/A;  ? CATARACT EXTRACTION W/ INTRAOCULAR LENS IMPLANT Bilateral 2021  ? COLONOSCOPY WITH ESOPHAGOGASTRODUODENOSCOPY (EGD)  01/28/2021  ? by [email or web address]  ? CORONARY ARTERY BYPASS GRAFT N/A 07/22/2021  ? Procedure: 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, ON PUMP, USING LEFT INTERNAL MAMMARY ARTERY AND ENDOSCOPICALLY HARVESTED RIGHT GREATER SAPHENOUS VEIN;  Surgeon: Melrose Nakayama, MD;  Location: [facility];  Service: Open Heart Surgery;  Laterality: N/A;  ? ENDOVEIN HARVEST OF GREATER SAPHENOUS VEIN  07/22/2021  ? Procedure:  ENDOVEIN HARVEST OF GREATER SAPHENOUS VEIN;  Surgeon: Melrose Nakayama, MD;  Location: [place] [facility] [place];  Service: Open Heart Surgery;;  ? EXTRACORPOREAL SHOCK WAVE LITHOTRIPSY    ? x2  1990s  ? FOOT SURGERY Right 1970  ? calcification removed from top of foot  ? GOLD SEED IMPLANT N/A 11/28/2021  ? Procedure: GOLD SEED IMPLANT;  Surgeon: Festus Aloe, MD;  Location: [facility];  Service: Urology;  Laterality: N/A;  ? KNEE ARTHROSCOPY Right 1991  ? LAPAROSCOPIC CHOLECYSTECTOMY  2000  ? LEFT HEART CATH AND CORONARY ANGIOGRAPHY N/A 07/21/2021  ? Procedure: LEFT HEART CATH AND CORONARY ANGIOGRAPHY;  Surgeon: Lorretta Harp, MD;  Location: [facility];  Service: Cardiovascular;  Laterality: N/A;  ? SPACE OAR INSTILLATION N/A 11/28/2021  ?  Procedure: SPACE OAR INSTILLATION;  Surgeon: Festus Aloe, MD;  Location: [facility];  Service: Urology;  Laterality: N/A;  ? TEE WITHOUT CARDIOVERSION N/A 07/22/2021  ? Procedure: TRANSESOPHAGEAL ECHOCARDIOGRAM (TEE);  Surgeon: Melrose Nakayama, MD;  Location: [place];  Service: Open Heart Surgery;  Laterality: N/A;  ? TOTAL KNEE ARTHROPLASTY Right 08/14/2009  ? @[facility]   ? TRANSURETHRAL RESECTION OF PROSTATE N/A 07/17/2021  ? Procedure: TRANSURETHRAL RESECTION OF THE PROSTATE (TURP);  Surgeon: [name], MD;  Location: [facility];  Service: Urology;  Laterality: N/A;  1 HR  ?  ? ?Home Medications:  ?Prior to Admission medications   ?Medication Sig Start Date End Date Taking? Authorizing Provider  ?amoxicillin-clavulanate (AUGMENTIN) 875-125 MG tablet Take 1 tablet by mouth 2 (two) times daily for 5 days. 01/15/22 01/20/22 Yes Mariel Aloe, MD  ?apixaban (ELIQUIS) 5 MG TABS tablet Take 1 tablet (5 mg total) by mouth 2 (two) times daily. 11/30/21  Yes Festus Aloe, MD  ?aspirin EC 81 MG EC tablet Take 1 tablet (81 mg total) by mouth daily. Swallow whole. 08/01/21  Yes Roddenberry, Arlis Porta, PA-C  ?brimonidine  (ALPHAGAN) 0.2 % ophthalmic solution Place 1 drop into the left eye in the morning and at bedtime.  09/10/17  Yes [provider]  ?budesonide (ENTOCORT EC) 3 MG 24 hr capsule Take 3 capsules (9 mg total

## 2022-01-19 NOTE — Progress Notes (Signed)
PT Cancellation Note ? ?Patient Details ?Name: [facility] ?MRN: [id number] ?DOB: [birth year] ? ? ?Cancelled Treatment:    Reason Eval/Treat Not Completed: Medical issues which prohibited therapy, RN reports patients HR  high/afib when up earlier. Will check back tomorrow. ?Tresa Endo PT ?Acute Rehabilitation Services ?Pager [phone] ?Office [phone] ? ? ? ?Philopateer Strine, Shella Maxim ?01/19/2022, 2:52 PM ?

## 2022-01-19 NOTE — Progress Notes (Signed)
Patient educated on 1800 mL fluid restriction.  Patient and wife verbalized understanding. ? ?Angie Fava, RN  ?

## 2022-01-20 ENCOUNTER — Ambulatory Visit: Payer: Medicare Other

## 2022-01-20 DIAGNOSIS — I5033 Acute on chronic diastolic (congestive) heart failure: Secondary | ICD-10-CM | POA: Diagnosis not present

## 2022-01-20 DIAGNOSIS — I4891 Unspecified atrial fibrillation: Secondary | ICD-10-CM | POA: Diagnosis not present

## 2022-01-20 DIAGNOSIS — I2510 Atherosclerotic heart disease of native coronary artery without angina pectoris: Secondary | ICD-10-CM | POA: Diagnosis not present

## 2022-01-20 DIAGNOSIS — J9601 Acute respiratory failure with hypoxia: Secondary | ICD-10-CM | POA: Diagnosis not present

## 2022-01-20 DIAGNOSIS — I10 Essential (primary) hypertension: Secondary | ICD-10-CM | POA: Diagnosis not present

## 2022-01-20 LAB — BASIC METABOLIC PANEL
Anion gap: 10 (ref 5–15)
BUN: 24 mg/dL — ABNORMAL HIGH (ref 8–23)
CO2: 28 mmol/L (ref 22–32)
Calcium: 9 mg/dL (ref 8.9–10.3)
Chloride: 97 mmol/L — ABNORMAL LOW (ref 98–111)
Creatinine, Ser: 0.88 mg/dL (ref 0.61–1.24)
GFR, Estimated: >60 mL/min (ref >60)
Glucose, Bld: 102 mg/dL — ABNORMAL HIGH (ref 70–99)
Potassium: 4 mmol/L (ref 3.5–5.1)
Sodium: 135 mmol/L (ref 135–145)

## 2022-01-20 LAB — MAGNESIUM: Magnesium: 1.7 mg/dL (ref 1.7–2.4)

## 2022-01-20 MED ORDER — FUROSEMIDE 40 MG PO TABS: 40.0000 mg | ORAL_TABLET | Freq: Every day | ORAL | Status: DC

## 2022-01-20 MED ORDER — DILTIAZEM LOAD VIA INFUSION
15.0000 mg | Freq: Once | INTRAVENOUS | Status: AC
  Administered 2022-01-20: 15 mg via INTRAVENOUS
  Filled 2022-01-20: qty 15

## 2022-01-20 MED ORDER — DILTIAZEM HCL-DEXTROSE 125-5 MG/125ML-% IV SOLN (PREMIX)
5.0000 mg/h | INTRAVENOUS | Status: DC
  Administered 2022-01-20: 5 mg/h via INTRAVENOUS
  Filled 2022-01-20: qty 125

## 2022-01-20 MED ORDER — ORAL CARE MOUTH RINSE
15.0000 mL | Freq: Two times a day (BID) | OROMUCOSAL | Status: DC
  Administered 2022-01-20 – 2022-01-23 (×5): 15 mL via OROMUCOSAL

## 2022-01-20 NOTE — Progress Notes (Signed)
?Progress Note ? ? ?Patient: Ryan [name] BSJ:[id number] DOB: Oct 22, 1945 DOA: 01/16/2022     3 ?DOS: the patient was seen and examined on 01/20/2022 ?  ?Brief hospital course: ?76 y.o. male with medical history significant of chronic diastolic congestive heart failure, atrial fibrillation on Eliquis, CAD s/p CABG, COPD, history of lymphocytic/microscopic colitis, and prostate cancer undergoing radiation therapy who presents with complaints of shortness of breath.  Hospitalized at [facility] from 4/9-4/13 after presenting with complaints of nausea, vomiting, diarrhea initially meeting SIRS criteria started on empiric antibiotics.  Work-up did not reveal clear source.  GI was consulted and started patient on budesonide.  C. difficile studies are negative, and no results available from GI panel that was collected on 4/11.  There was a question of possible radiation-induced colitis.  He was seen by radiation oncology and treatments were held plans to resume once patient stable.  Lasix had been held during his hospitalization, but it appears recommended to resume at discharge.  Since being home patient reports taking at least 1 dose of Lasix.  However, yesterday developed acute onset of shortness of breath with tightness across his chest and complains of left-sided chest pain.  He notes that he had been having some swelling in his legs.  Ever since he had been started on antibiotics he had been having redness of his nose and cheeks that they thought was possibly related to the antibiotics.  He notes that the rash does not itch. ?  ?  ?Upon admission into the emergency department patient was seen to be afebrile with pulse 79-126, respiration 15-40, blood pressures maintained, and O2 saturations noted a in the 87% with improvement on 2 L of nasal cannula oxygen to greater than 92%.  Chest x-ray noted cardiomegaly with pulmonary vascular congestion and increased interstitial prominence bilaterally concerning for possible  edema or infiltrate with small bilateral pleural effusions.  Labs from 4/14 significant for WBC 7, hemoglobin 8.7, potassium 3.4, BNP 605.1, high-sensitivity troponins negative x2,  lactic acid 1.2, and magnesium 1.1.  CT angiogram of the chest showed no signs of a pulmonary embolus with hazy groundglass opacities in the lungs bilaterally concerning for edema or infiltrate and small to moderate bilateral pleural effusions.  Blood cultures were obtained.  Patient has been given Lasix 40 mg IV, Lasix 20 mg p.o., vancomycin, cefepime, and morphine. ?  ?4/16- reports coughing up dark streaky sputum. Denies SOB. Does report productive cough, able to expectorate well. Denies CP. Output -1.5 L with gentle diuresis ? ?Assessment and Plan: ?Heart failure with preserved ejection fraction ([place]) ?Patient presents with complaints of shortness of breath found to be hypoxic as low as 87% with improvement on 2 to 3 L of nasal cannula oxygen to greater than 92%.  Chest x-ray noted concern for increased interstitial opacities concerning for edema with small bilateral pleural effusions.  BNP was elevated at 605.  Recently had echocardiogram on 01/13/2022 which noted EF of 55-60%, but left diastolic function could not be fully evaluated.  Patient had been given Lasix 40 mg IV and 20 mg p.o in the ED. Suspect this is secondary to recent hospitalization with nausea and vomiting where patient was given IV fluids and diuretics held. ? -Heart failure order set utilized ?-Continuous pulse oximetry with nasal cannula oxygen to maintain O2 saturation greater than 92%. ?-Strict I&Os and daily weight ?-Will continue daily gentle diruesis, encourage OOB as tolerated TID and mobilization. Would like to fluid restrict as tolerated for best outcome.  ?-  Continue beta-blocker ?Continue gentle diuresis as tolerated ? ?Nausea, vomiting, and diarrhea ?Resolved with budesonide treatment. ? ? ?Prostate cancer ([place]) ?Followed by Dr. Eulogio Ditch and Dr.  Tammi Klippel.  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 ?-Notify Dr. Tammi Klippel if patient wanting prolonged hospitalization ? ?Blood-streaked sputum ?Improving slowly, ?Exacerbated by pulm edema ?Likely upper airway inflammation, low threshold for Angiography Chest given current blood thinner. If worsens hold eliquis (on for Afib) ? ? ?Dyspnea, improving ?Ongoing diuresing ?Cardiology consulted and following. ? ?Intermittent A-fib with RVR ?On Eliquis for CVA prevention, continue ?Cardizem drip started by cardiology on 01/20/2022 ?Currently on Lopressor 25 mg twice daily ?Management per cardiology ? ?Coronary artery disease status post CABG in 2022 ?Denies any anginal symptoms at the time of the visit. ?Continue aspirin, metoprolol and Repatha, patient is intolerant of statin ? ?  ? ?Subjective: Feels his breathing is better today with ongoing diuresing.. ? ? ?Physical Exam: ?Vitals:  ? 01/20/22 1315 01/20/22 1400 01/20/22 1410 01/20/22 1703  ?BP:  102/63 106/61 109/67  ?Pulse:   (!) 102 (!) 55  ?Resp:  (!) 24  17  ?Temp:    98.2 ?F (36.8 ?C)  ?TempSrc:    Oral  ?SpO2: 95%   95%  ?Weight:      ?Height:      ?Chronically ill-appearing in no acute distress.  He is alert and oriented x3. ?Eyes: PERRL, injectable injection of the left eye with mild lid swelling ?ENMT: Mucous membranes are moist.  Posterior pharynx clear of any exudate or lesions.  ?Neck: normal, supple.  Mild JVD appreciated. ?Respiratory: Clear to auscultation no wheezes or rales.   ?Cardiovascular: Irregular rate and rhythm no rubs gallops.   ?Abdomen: Soft nontender normal bowel sounds present. ?Musculoskeletal: No lower extremity edema.   ?Skin: Erythema and of the bridge of the nose and bilateral cheeks ?Neurologic: CN 2-12 grossly intact.  Able to move all extremities. ?Psychiatric: Mood is appropriate for condition and setting. ?Data Reviewed: ? ?Results for orders placed or performed during the hospital encounter of  01/16/22 (from the past 24 hour(s))  ?Basic metabolic panel     Status: Abnormal  ? Collection Time: 01/20/22  3:47 AM  ?Result Value Ref Range  ? Sodium 135 135 - 145 mmol/L  ? Potassium 4.0 3.5 - 5.1 mmol/L  ? Chloride 97 (L) 98 - 111 mmol/L  ? CO2 28 22 - 32 mmol/L  ? Glucose, Bld 102 (H) 70 - 99 mg/dL  ? BUN 24 (H) 8 - 23 mg/dL  ? Creatinine, Ser 0.88 0.61 - 1.24 mg/dL  ? Calcium 9.0 8.9 - 10.3 mg/dL  ? GFR, Estimated >60 >60 mL/min  ? Anion gap 10 5 - 15  ?Magnesium     Status: None  ? Collection Time: 01/20/22  3:47 AM  ?Result Value Ref Range  ? Magnesium 1.7 1.7 - 2.4 mg/dL  ? ? ? ?Family Communication: Updated his wife at bedside. ? ?Disposition: ?Status is: Inpatient ?Remains inpatient appropriate because: SOB, dyspnea, CHF exacerbation ? Planned Discharge Destination: Home ? ? ? ?Time spent:  35 minutes ? ?Author: ?Kayleen Memos, DO ?01/20/2022 5:15 PM ? ?For on call review [email or web address].  ?

## 2022-01-20 NOTE — Progress Notes (Signed)
Diltiazem bolus administered and gtt begun per MD order.  Central monitoring notified.  VS prior to administration: HR 102, BP 106/61, SpO2 95% on 1 L [place].  5 min into transfusion, BP 90/54, HR 74, SpO2 94% 1 L [place].  Patient reported lightheadedness and feeling tired.  Infusion stopped per order and MD notified.  MD ordered to keep infusion turned off.  ? ?VS rechecked 5 min followed discontinuation of infusion: BP 105/53, HR 75, SpO2 93 on 1 L [place].  Patient reports lightheadedness has gone away.  ? ?Will continue to monitor. ? ?Angie Fava, RN  ?

## 2022-01-20 NOTE — Progress Notes (Signed)
Occupational Therapy Evaluation ? ?Patient lives at home with spouse and is typically independent with self care and mobility. Patient recently discharged home however returned with difficulty breathing due to fluid retention. Patient did have minor loss of balance transferring to recliner chair needing min G assist for safety. Note HR increase 130s with minimal activity. Acute OT to follow, do not anticipate any needs at D/C.  ? ? ? 01/20/22 1246  ?OT Visit Information  ?Last OT Received On 01/20/22  ?Assistance Needed +1  ?History of Present Illness 76 yo male admitted with acute resp failure, acute on chronic HF, Afib. Hx of CAD, CABG, COPD, Afib, prostate ca-radiation therapy  ?Precautions  ?Precautions Fall  ?Precaution Comments monitor O2, HR  ?Restrictions  ?Weight Bearing Restrictions No  ?Home Living  ?Family/patient expects to be discharged to: Private residence  ?Living Arrangements Spouse/significant other  ?Available Help at Discharge Family;Neighbor;Available 24 hours/day  ?Type of Home House  ?Home Access Stairs to enter  ?Entrance Stairs-Number of Steps 3  ?Entrance Stairs-Rails Right  ?Home Layout Able to live on main level with bedroom/bathroom;Laundry or work area in basement  ?Alternate Level Stairs-Number of Steps flight  ?Alternate Level Stairs-Rails Left  ?Bathroom Shower/Tub Walk-in shower  ?Bathroom Toilet Standard  ?Bathroom Accessibility Yes  ?Home Equipment [employer] (2 wheels);Standard Walker;Cane - quad;Cane - single point;Shower seat - built in;BSC/3in1  ?Additional Comments lives with wife who can assist as needed. wife has early dementia, per pt but able to drive patient, per pt.  ?Prior Function  ?Prior Level of Function  Independent/Modified Independent  ?Mobility Comments Independent without DME but uses cane PRN. does not drive due to [place]. degen.  ?Communication  ?Communication No difficulties  ?Pain Assessment  ?Pain Assessment No/denies pain  ?Cognition  ?Arousal/Alertness  Awake/alert  ?Behavior During Therapy [facility] for tasks assessed/performed  ?Overall Cognitive Status Within Functional Limits for tasks assessed  ?Upper Extremity Assessment  ?Upper Extremity Assessment Overall WFL for tasks assessed  ?Lower Extremity Assessment  ?Lower Extremity Assessment Defer to PT evaluation  ?Cervical / Trunk Assessment  ?Cervical / Trunk Assessment Normal  ?ADL  ?Overall ADL's  Needs assistance/impaired  ?Eating/Feeding Independent  ?Grooming Set up;Sitting  ?Upper Body Bathing Set up;Sitting  ?Lower Body Bathing Min guard;Sit to/from stand;Sitting/lateral leans  ?Upper Body Dressing  Set up;Sitting  ?Lower Body Dressing Min guard;Sit to/from stand;Sitting/lateral leans  ?Armed forces technical officer Min guard;Stand-pivot  ?Toilet Transfer Details (indicate cue type and reason) Patient had posterior loss of balance when turning to recliner chair needing min G for safety.  ?Glass blower/designer guard;Sit to/from stand  ?Functional mobility during ADLs Min guard  ?General ADL Comments Patient is close to his baseline needing min G for safety due to minor loss of balance  ?Bed Mobility  ?Overal bed mobility Modified Independent  ?Balance  ?Overall balance assessment Mild deficits observed, not formally tested  ?OT - End of Session  ?Equipment Utilized During Treatment Oxygen  ?Activity Tolerance Patient tolerated treatment well  ?Patient left in chair;with call bell/phone within reach;with family/visitor present  ?Nurse Communication Mobility status  ?OT Assessment  ?OT Recommendation/Assessment Patient needs continued OT Services  ?OT Visit Diagnosis Unsteadiness on feet (R26.81)  ?OT Problem List Decreased activity tolerance;Decreased safety awareness  ?OT Plan  ?OT Frequency (ACUTE ONLY) Min 2X/week  ?OT Treatment/Interventions (ACUTE ONLY) Self-care/ADL training;Balance training;Patient/family education;Therapeutic activities  ?AM-PAC OT "6 Clicks" Daily Activity Outcome Measure  (Version 2)  ?Help from another person eating meals? 4  ?  Help from another person taking care of personal grooming? 3  ?Help from another person toileting, which includes using toliet, bedpan, or urinal? 3  ?Help from another person bathing (including washing, rinsing, drying)? 3  ?Help from another person to put on and taking off regular upper body clothing? 4  ?Help from another person to put on and taking off regular lower body clothing? 3  ?6 Click Score 20  ?Progressive Mobility  ?What is the highest level of mobility based on the progressive mobility assessment? Level 4 (Walks with assist in room) - Balance while marching in place and cannot step forward and back - Complete  ?Activity Ambulated with assistance in room  ?OT Recommendation  ?Follow Up Recommendations No OT follow up  ?Assistance recommended at discharge PRN  ?Patient can return home with the following A little help with bathing/dressing/bathroom  ?Functional Status Assessent Patient has had a recent decline in their functional status and demonstrates the ability to make significant improvements in function in a reasonable and predictable amount of time.  ?OT Equipment None recommended by OT  ?Individuals Consulted  ?Consulted and Agree with Results and Recommendations Patient  ?Acute Rehab OT Goals  ?Patient Stated Goal Home  ?OT Goal Formulation With patient  ?Time For Goal Achievement 02/03/22  ?Potential to Achieve Goals Good  ?OT Time Calculation  ?OT Start Time (ACUTE ONLY) 0749  ?OT Stop Time (ACUTE ONLY) 0800  ?OT Time Calculation (min) 11 min  ?OT General Charges  ?$OT Visit 1 Visit  ?OT Evaluation  ?$OT Eval Low Complexity 1 Low  ?Written Expression  ?Dominant Hand Right  ? ?Delbert Phenix OT ?OT pager: [phone] ? ?

## 2022-01-20 NOTE — Progress Notes (Signed)
? ?Progress Note ? ?Patient Name: Ryan [name] ?Date of Encounter: 01/20/2022 ? ?[facility] Cardiologist: Cristopher Peru, MD  ? ?Subjective  ? ?Denies any further chest discomfort and breathing improved ? ?Inpatient Medications  ?  ?Scheduled Meds: ? apixaban  5 mg Oral BID  ? aspirin EC  81 mg Oral Daily  ? brimonidine  1 drop Left Eye BID  ? budesonide  9 mg Oral Daily  ? Ferrous Fumarate  106 mg of iron Oral QHS  ? furosemide  40 mg Intravenous Daily  ? latanoprost  1 drop Left Eye QHS  ? magnesium oxide  400 mg Oral BID  ? metoprolol tartrate  25 mg Oral BID  ? pantoprazole  40 mg Oral Daily  ? sodium chloride flush  3 mL Intravenous Q12H  ? tamsulosin  0.4 mg Oral BID  ? ?Continuous Infusions: ? sodium chloride    ? ?PRN Meds: ?sodium chloride, acetaminophen, clobetasol cream, levalbuterol, loperamide, metoprolol tartrate, morphine injection, ondansetron (ZOFRAN) IV, oxyCODONE-acetaminophen, sodium chloride flush  ? ?Vital Signs  ?  ?Vitals:  ? 01/19/22 2306 01/20/22 0500 01/20/22 0831 01/20/22 1012  ?BP: (!) 148/75  113/65   ?Pulse: 100  (!) 115 (!) 119  ?Resp: 16  (!) 22   ?Temp: 98.4 ?F (36.9 ?C)  98 ?F (36.7 ?C)   ?TempSrc: Oral  Oral   ?SpO2: 95%  100% 100%  ?Weight:  71.8 kg    ?Height:      ? ? ?Intake/Output Summary (Last 24 hours) at 01/20/2022 1041 ?Last data filed at 01/20/2022 1009 ?Gross per 24 hour  ?Intake 840 ml  ?Output 2500 ml  ?Net -1660 ml  ? ? ?  01/20/2022  ?  5:00 AM 01/19/2022  ?  4:36 AM 01/18/2022  ?  3:04 AM  ?Last 3 Weights  ?Weight (lbs) 158 lb 4.6 oz 158 lb 1.1 oz 152 lb 14.4 oz  ?Weight (kg) 71.8 kg 71.7 kg 69.355 kg  ?   ? ?Telemetry  ?  ?Atrial fibrillation with RVR - Personally Reviewed ? ?ECG  ?  ?No new EKG to review - Personally Reviewed ? ?Physical Exam  ? ?GEN: No acute distress.   ?Neck: No JVD ?Cardiac: irregularly irregular and tachy, no murmurs, rubs, or gallops.  ?Respiratory: Clear to auscultation bilaterally. ?GI: Soft, nontender, non-distended  ?MS: No edema; No  deformity. ?Neuro:  Nonfocal  ?Psych: Normal affect  ? ?Labs  ?  ?High Sensitivity Troponin:   ?Recent Labs  ?Lab 01/11/22 ?0959 01/11/22 ?1141 01/16/22 ?2240 01/17/22 ?0251  ?TROPONINIHS 11 11 11 12   ?   ? ?Chemistry ?Recent Labs  ?Lab 01/14/22 ?0401 01/16/22 ?2240 01/17/22 ?0251 01/18/22 ?0355 01/19/22 ?0359 01/20/22 ?9417  ?NA 137 142   < > 140 139 135  ?K 3.7 3.4*   < > 4.2 4.3 4.0  ?CL 110 111   < > 102 102 97*  ?CO2 19* 23   < > 29 30 28   ?GLUCOSE 119* 111*   < > 98 101* 102*  ?BUN 11 18   < > 18 24* 24*  ?CREATININE 0.72 0.80   < > 0.90 0.99 0.88  ?CALCIUM 8.7* 8.6*   < > 8.9 9.0 9.0  ?PROT 6.2* 6.3*  --   --   --   --   ?ALBUMIN 2.5* 2.8*  --   --   --   --   ?AST 20 25  --   --   --   --   ?  ALT 16 20  --   --   --   --   ?ALKPHOS 51 50  --   --   --   --   ?BILITOT 0.6 0.6  --   --   --   --   ?GFRNONAA >60 >60   < > >60 >60 >60  ?ANIONGAP 8 8   < > 9 7 10   ? < > = values in this interval not displayed.  ?  ? ?Hematology ?Recent Labs  ?Lab 01/14/22 ?0401 01/16/22 ?2240 01/17/22 ?0845  ?WBC 6.5 7.0 7.1  ?RBC 3.31* 2.90* 2.95*  ?HGB 9.7* 8.7* 8.9*  ?HCT 28.3* 25.5* 26.0*  ?MCV 85.5 87.9 88.1  ?MCH 29.3 30.0 30.2  ?MCHC 34.3 34.1 34.2  ?RDW 14.0 14.6 14.7  ?PLT 243 276 308  ? ? ?BNP ?Recent Labs  ?Lab 01/16/22 ?2240  ?BNP 605.1*  ?  ? ?DDimer No results for input(s): DDIMER in the last 168 hours. ? ? ?CHA2DS2-VASc Score = 6  ?This indicates a 9.7% annual risk of stroke. ?The patient's score is based upon: ?CHF History: 1 ?HTN History: 1 ?Diabetes History: 0 ?Stroke History: 2 ?Vascular Disease History: 1 ?Age Score: 1 ?Gender Score: 0 ?  ? ?Radiology  ?  ?No results found. ? ?Cardiac Studies  ? ?Echo 01/13/22: ?1. Left ventricular ejection fraction, by estimation, is 55 to 60%. The  ?left ventricle has normal function. The left ventricle has no regional  ?wall motion abnormalities. Left ventricular diastolic function could not  ?be evaluated.  ? 2. Right ventricular systolic function is normal. The right  ventricular  ?size is normal.  ? 3. Left atrial size was severely dilated.  ? 4. Right atrial size was severely dilated.  ? 5. The mitral valve is normal in structure. Mild mitral valve  ?regurgitation. No evidence of mitral stenosis.  ? 6. The aortic valve is normal in structure. Aortic valve regurgitation is  ?not visualized. Aortic valve sclerosis is present, with no evidence of  ?aortic valve stenosis.  ? 7. The inferior vena cava is normal in size with greater than 50%  ?respiratory variability, suggesting right atrial pressure of 3 mmHg.  ?  ? ?Patient Profile  ?   ?76 y.o. male with a hx of metastatic prostate cancer, COPD, CAD s/p CABG, chronic AF, CKD, arthritis, chronic diastolic heart failure, and hypertension who is being seen 01/19/2022 for the evaluation of CHF at the request of Dr. Nevada Crane. ? ?Assessment & Plan  ?  ?Acute on chronic diastolic heart failure ?CKD ?- recent echo with preserved EF, mild MR ?- Recent hospital admission with nausea and vomiting and diarrhea presumed secondary to recent XRT treatments for prostate cancer.  He received IV fluid resuscitation at that time ?- Now admitted with volume overload ?- He has gotten several doses of IV diuretics and put out 2.7L yesterday and is net -4.92 L. ?- Weight is down 5 pounds from admission but no change from yesterday ?- Serum creatinine stable at 0.88 and BUN 28 potassium 4.0 ?- crackles in lungs have resolved ?- weight 160lbs at last OV with Dr. Lovena Le in January and now 158lbs so I think he is euvolemic today ?- he got his lasix IV this am so will change to Lasix 40mg  PO daily starting tomorrow (was only taking 20mg  PRN at home) ?  ?Chronic atrial fibrillation ?Hx of stroke on eliquis ?Chronic anticoagulation ?- Heart rate poorly controlled on exam today up into the 120-130's ?-  Continue Eliquis 5 mg twice daily  ?- start IV Cardizem gtt to try to get HR controlled ?- continue Lopressor 25mg  BID ?  ?CAD s/p CABG x 4 07/2021 ?Hyperlipidemia  with LDL goal < 70 ?-He did have a chest tightness but it is more with taking a deep breath and coughing and suspect is related to his volume overload as he just had CABG less than a year ago.  EKG is nonischemic and at bedtime troponin negative x2 ?-no further ischemic workup at this time unless he has recurrent CP when euvolemic and controlled HR ?-continue aspirin 81 mg daily, metoprolol tartrate 25 mg twice daily ?-He is statin intolerant and is on Repatha ?   ? ?For questions or updates, please contact [facility] ?Please consult [email or web address] for contact info under  ? ?  ?   ?Signed, ?Fransico Him, MD  ?01/20/2022, 10:41 AM    ?

## 2022-01-20 NOTE — Evaluation (Signed)
Physical Therapy Evaluation ?Patient Details ?Name: Ryan [name] ?MRN: [id number] ?DOB: 10/13/1945 ?Today's Date: 01/20/2022 ? ?History of Present Illness ? 76 yo male admitted with acute resp failure, acute on chronic HF, Afib. Hx of CAD, CABG, COPD, Afib, prostate ca-radiation therapy  ?Clinical Impression ? On eval, pt was Min guard assist for mobility. He walked ~50 feet around the room with his cane. O2 92% on RA, HR 120 bpm, dyspnea 2/4. He did report some dizziness towards end of ambulation distance. Will plan to follow and progress activity as tolerated. At this time, do not anticipate any HH f/u but will continue to assess.    ?   ? ?Recommendations for follow up therapy are one component of a multi-disciplinary discharge planning process, led by the attending physician.  Recommendations may be updated based on patient status, additional functional criteria and insurance authorization. ? ?Follow Up Recommendations No PT follow up ? ?  ?Assistance Recommended at Discharge Intermittent Supervision/Assistance  ?Patient can return home with the following ? Assist for transportation;Assistance with cooking/housework;Help with stairs or ramp for entrance ? ?  ?Equipment Recommendations None recommended by PT  ?Recommendations for Other Services ?    ?  ?Functional Status Assessment Patient has had a recent decline in their functional status and demonstrates the ability to make significant improvements in function in a reasonable and predictable amount of time.  ? ?  ?Precautions / Restrictions Precautions ?Precautions: Fall ?Precaution Comments: monitor O2, HR ?Restrictions ?Weight Bearing Restrictions: No  ? ?  ? ?Mobility ? Bed Mobility ?Overal bed mobility: Modified Independent ?  ?  ?  ?  ?  ?  ?  ?  ? ?Transfers ?Overall transfer level: Needs assistance ?  ?Transfers: Sit to/from Stand ?Sit to Stand: Supervision ?  ?  ?  ?  ?  ?General transfer comment: Supv for safety. ?  ? ?Ambulation/Gait ?Ambulation/Gait  assistance: Min guard ?Gait Distance (Feet): 50 Feet ?Assistive device: Straight cane ?Gait Pattern/deviations: Step-through pattern ?  ?  ?  ?General Gait Details: MIn guard for safety. Slow gait speed. O2 92% on RA, HR 120 bpm, dyspnea 2/4. Pt did report some dizziness towards end of distance so deferred any further ambulation ? ?Stairs ?  ?  ?  ?  ?  ? ?Wheelchair Mobility ?  ? ?Modified Rankin (Stroke Patients Only) ?  ? ?  ? ?Balance Overall balance assessment: Mild deficits observed, not formally tested ?  ?  ?  ?  ?  ?  ?  ?  ?  ?  ?  ?  ?  ?  ?  ?  ?  ?  ?   ? ? ? ?Pertinent Vitals/Pain Pain Assessment ?Pain Assessment: No/denies pain  ? ? ?Home Living Family/patient expects to be discharged to:: Private residence ?Living Arrangements: Spouse/significant other ?Available Help at Discharge: Family;Neighbor;Available 24 hours/day ?Type of Home: House ?Home Access: Stairs to enter ?Entrance Stairs-Rails: Right ?Entrance Stairs-Number of Steps: 3 ?Alternate Level Stairs-Number of Steps: flight ?Home Layout: Able to live on main level with bedroom/bathroom;Laundry or work area in basement ?Home Equipment: Conservation officer, nature (2 wheels);Standard Walker;Cane - quad;Cane - single point;Shower seat - built in;BSC/3in1 ?Additional Comments: lives with wife who can assist as needed. wife has early dementia, per pt but able to drive patient, per pt.  ?  ?Prior Function Prior Level of Function : Independent/Modified Independent ?  ?  ?  ?  ?  ?  ?Mobility Comments: Independent without  DME but uses cane PRN. does not drive due to [place]. degen. ?  ?  ? ? ?Hand Dominance  ? Dominant Hand: Right ? ?  ?Extremity/Trunk Assessment  ? Upper Extremity Assessment ?Upper Extremity Assessment: Defer to OT evaluation ?  ? ?Lower Extremity Assessment ?Lower Extremity Assessment: Generalized weakness ?  ? ?Cervical / Trunk Assessment ?Cervical / Trunk Assessment: Normal  ?Communication  ? Communication: No difficulties  ?Cognition  Arousal/Alertness: Awake/alert ?Behavior During Therapy: [facility] for tasks assessed/performed ?Overall Cognitive Status: Within Functional Limits for tasks assessed ?  ?  ?  ?  ?  ?  ?  ?  ?  ?  ?  ?  ?  ?  ?  ?  ?  ?  ?  ? ?  ?General Comments   ? ?  ?Exercises    ? ?Assessment/Plan  ?  ?PT Assessment Patient needs continued PT services  ?PT Problem List Decreased mobility;Decreased activity tolerance;Decreased balance;Decreased knowledge of use of DME ? ?   ?  ?PT Treatment Interventions DME instruction;Gait training;Therapeutic activities;Therapeutic exercise;Patient/family education;Balance training   ? ?PT Goals (Current goals can be found in the Care Plan section)  ?Acute Rehab PT Goals ?Patient Stated Goal: to go home ?PT Goal Formulation: With patient ?Time For Goal Achievement: 02/03/22 ?Potential to Achieve Goals: Good ? ?  ?Frequency Min 3X/week ?  ? ? ?Co-evaluation   ?  ?  ?  ?  ? ? ?  ?AM-PAC PT "6 Clicks" Mobility  ?Outcome Measure Help needed turning from your back to your side while in a flat bed without using bedrails?: None ?Help needed moving from lying on your back to sitting on the side of a flat bed without using bedrails?: None ?Help needed moving to and from a bed to a chair (including a wheelchair)?: A Little ?Help needed standing up from a chair using your arms (e.g., wheelchair or bedside chair)?: A Little ?Help needed to walk in hospital room?: A Little ?Help needed climbing 3-5 steps with a railing? : A Little ?6 Click Score: 20 ? ?  ?End of Session   ?Activity Tolerance: Patient tolerated treatment well ?Patient left: in bed;with call bell/phone within reach ?  ?PT Visit Diagnosis: Unsteadiness on feet (R26.81);Difficulty in walking, not elsewhere classified (R26.2) ?  ? ?Time: 1583-0940 ?PT Time Calculation (min) (ACUTE ONLY): 23 min ? ? ?Charges:   PT Evaluation ?$PT Eval Moderate Complexity: 1 Mod ?  ?  ?   ? ? ? ? ? ?Ryan [name] P, PT ?Acute Rehabilitation  ?Office: [phone] ?Pager:  [phone] ? ?  ? ?

## 2022-01-21 ENCOUNTER — Ambulatory Visit: Payer: Medicare Other

## 2022-01-21 DIAGNOSIS — I5033 Acute on chronic diastolic (congestive) heart failure: Secondary | ICD-10-CM | POA: Diagnosis not present

## 2022-01-21 DIAGNOSIS — R042 Hemoptysis: Secondary | ICD-10-CM

## 2022-01-21 DIAGNOSIS — I4891 Unspecified atrial fibrillation: Secondary | ICD-10-CM | POA: Diagnosis not present

## 2022-01-21 DIAGNOSIS — I2510 Atherosclerotic heart disease of native coronary artery without angina pectoris: Secondary | ICD-10-CM | POA: Diagnosis not present

## 2022-01-21 DIAGNOSIS — R112 Nausea with vomiting, unspecified: Secondary | ICD-10-CM

## 2022-01-21 DIAGNOSIS — R197 Diarrhea, unspecified: Secondary | ICD-10-CM

## 2022-01-21 DIAGNOSIS — J9601 Acute respiratory failure with hypoxia: Secondary | ICD-10-CM | POA: Diagnosis not present

## 2022-01-21 DIAGNOSIS — I10 Essential (primary) hypertension: Secondary | ICD-10-CM | POA: Diagnosis not present

## 2022-01-21 DIAGNOSIS — I509 Heart failure, unspecified: Secondary | ICD-10-CM | POA: Diagnosis not present

## 2022-01-21 LAB — CBC
HCT: 30.4 % — ABNORMAL LOW (ref 39.0–52.0)
Hemoglobin: 10 g/dL — ABNORMAL LOW (ref 13.0–17.0)
MCH: 29.2 pg (ref 26.0–34.0)
MCHC: 32.9 g/dL (ref 30.0–36.0)
MCV: 88.9 fL (ref 80.0–100.0)
Platelets: 304 10*3/uL (ref 150–400)
RBC: 3.42 MIL/uL — ABNORMAL LOW (ref 4.22–5.81)
RDW: 14.4 % (ref 11.5–15.5)
WBC: 8 10*3/uL (ref 4.0–10.5)
nRBC: 0 % (ref 0.0–0.2)

## 2022-01-21 LAB — BASIC METABOLIC PANEL
Anion gap: 9 (ref 5–15)
BUN: 30 mg/dL — ABNORMAL HIGH (ref 8–23)
CO2: 27 mmol/L (ref 22–32)
Calcium: 9.4 mg/dL (ref 8.9–10.3)
Chloride: 100 mmol/L (ref 98–111)
Creatinine, Ser: 1.01 mg/dL (ref 0.61–1.24)
GFR, Estimated: >60 mL/min (ref >60)
Glucose, Bld: 105 mg/dL — ABNORMAL HIGH (ref 70–99)
Potassium: 4.6 mmol/L (ref 3.5–5.1)
Sodium: 136 mmol/L (ref 135–145)

## 2022-01-21 LAB — MAGNESIUM: Magnesium: 1.8 mg/dL (ref 1.7–2.4)

## 2022-01-21 LAB — PHOSPHORUS: Phosphorus: 3.8 mg/dL (ref 2.5–4.6)

## 2022-01-21 MED ORDER — FUROSEMIDE 20 MG PO TABS
20.0000 mg | ORAL_TABLET | Freq: Every day | ORAL | Status: DC
  Filled 2022-01-21: qty 1

## 2022-01-21 MED ORDER — METOPROLOL TARTRATE 25 MG PO TABS
12.5000 mg | ORAL_TABLET | Freq: Once | ORAL | Status: AC
Start: 2022-01-21 — End: 2022-01-21
  Administered 2022-01-21: 12.5 mg via ORAL

## 2022-01-21 MED ORDER — METOPROLOL TARTRATE 25 MG PO TABS
37.5000 mg | ORAL_TABLET | Freq: Two times a day (BID) | ORAL | Status: DC
  Administered 2022-01-21: 37.5 mg via ORAL
  Filled 2022-01-21 (×2): qty 2

## 2022-01-21 NOTE — Progress Notes (Signed)
? ?Progress Note ? ?Patient Name: Ryan [name] ?Date of Encounter: 01/21/2022 ? ?[facility] Cardiologist: Cristopher Peru, MD  ? ?Subjective  ? ?Denies any chest pain and breathing improved ? ?Inpatient Medications  ?  ?Scheduled Meds: ? apixaban  5 mg Oral BID  ? aspirin EC  81 mg Oral Daily  ? brimonidine  1 drop Left Eye BID  ? budesonide  9 mg Oral Daily  ? Ferrous Fumarate  106 mg of iron Oral QHS  ? furosemide  40 mg Oral Daily  ? latanoprost  1 drop Left Eye QHS  ? magnesium oxide  400 mg Oral BID  ? mouth rinse  15 mL Mouth Rinse BID  ? metoprolol tartrate  25 mg Oral BID  ? pantoprazole  40 mg Oral Daily  ? sodium chloride flush  3 mL Intravenous Q12H  ? tamsulosin  0.4 mg Oral BID  ? ?Continuous Infusions: ? sodium chloride    ? diltiazem (CARDIZEM) infusion Stopped (01/20/22 1421)  ? ?PRN Meds: ?sodium chloride, acetaminophen, clobetasol cream, levalbuterol, loperamide, metoprolol tartrate, morphine injection, ondansetron (ZOFRAN) IV, oxyCODONE-acetaminophen, sodium chloride flush  ? ?Vital Signs  ?  ?Vitals:  ? 01/20/22 1703 01/20/22 2105 01/20/22 2119 01/21/22 0610  ?BP: 109/67 123/81  119/69  ?Pulse: (!) 55 (!) 49 97 87  ?Resp: 17 16  18   ?Temp: 98.2 ?F (36.8 ?C) 98.2 ?F (36.8 ?C)  98 ?F (36.7 ?C)  ?TempSrc: Oral Oral  Oral  ?SpO2: 95% 93%  95%  ?Weight:      ?Height:      ? ? ?Intake/Output Summary (Last 24 hours) at 01/21/2022 1131 ?Last data filed at 01/21/2022 0900 ?Gross per 24 hour  ?Intake 1035.29 ml  ?Output 1300 ml  ?Net -264.71 ml  ? ? ? ?  01/20/2022  ?  5:00 AM 01/19/2022  ?  4:36 AM 01/18/2022  ?  3:04 AM  ?Last 3 Weights  ?Weight (lbs) 158 lb 4.6 oz 158 lb 1.1 oz 152 lb 14.4 oz  ?Weight (kg) 71.8 kg 71.7 kg 69.355 kg  ?   ? ?Telemetry  ?  ?Atrial fibrillation with heart rate in the low 100s- Personally Reviewed ? ?ECG  ?  ?No new EKG to review - Personally Reviewed ? ?Physical Exam  ? ?GEN: Well nourished, well developed in no acute distress ?HEENT: Normal ?NECK: No JVD; No carotid  bruits ?LYMPHATICS: No lymphadenopathy ?CARDIAC: Irregularly irregular, no murmurs, rubs, gallops ?RESPIRATORY:  Clear to auscultation without rales, wheezing or rhonchi  ?ABDOMEN: Soft, non-tender, non-distended ?MUSCULOSKELETAL:  No edema; No deformity  ?SKIN: Warm and dry ?NEUROLOGIC:  Alert and oriented x 3 ?PSYCHIATRIC:  Normal affect   ?Labs  ?  ?High Sensitivity Troponin:   ?Recent Labs  ?Lab 01/11/22 ?0959 01/11/22 ?1141 01/16/22 ?2240 01/17/22 ?0251  ?TROPONINIHS 11 11 11 12   ? ?   ? ?Chemistry ?Recent Labs  ?Lab 01/16/22 ?2240 01/17/22 ?0251 01/19/22 ?0359 01/20/22 ?2025 01/21/22 ?0424  ?NA 142   < > 139 135 136  ?K 3.4*   < > 4.3 4.0 4.6  ?CL 111   < > 102 97* 100  ?CO2 23   < > 30 28 27   ?GLUCOSE 111*   < > 101* 102* 105*  ?BUN 18   < > 24* 24* 30*  ?CREATININE 0.80   < > 0.99 0.88 1.01  ?CALCIUM 8.6*   < > 9.0 9.0 9.4  ?PROT 6.3*  --   --   --   --   ?  ALBUMIN 2.8*  --   --   --   --   ?AST 25  --   --   --   --   ?ALT 20  --   --   --   --   ?ALKPHOS 50  --   --   --   --   ?BILITOT 0.6  --   --   --   --   ?GFRNONAA >60   < > >60 >60 >60  ?ANIONGAP 8   < > 7 10 9   ? < > = values in this interval not displayed.  ? ?  ? ?Hematology ?Recent Labs  ?Lab 01/16/22 ?2240 01/17/22 ?4401 01/21/22 ?0424  ?WBC 7.0 7.1 8.0  ?RBC 2.90* 2.95* 3.42*  ?HGB 8.7* 8.9* 10.0*  ?HCT 25.5* 26.0* 30.4*  ?MCV 87.9 88.1 88.9  ?MCH 30.0 30.2 29.2  ?MCHC 34.1 34.2 32.9  ?RDW 14.6 14.7 14.4  ?PLT 276 308 304  ? ? ? ?BNP ?Recent Labs  ?Lab 01/16/22 ?2240  ?BNP 605.1*  ? ?  ? ?DDimer No results for input(s): DDIMER in the last 168 hours. ? ? ?CHA2DS2-VASc Score = 6  ?This indicates a 9.7% annual risk of stroke. ?The patient's score is based upon: ?CHF History: 1 ?HTN History: 1 ?Diabetes History: 0 ?Stroke History: 2 ?Vascular Disease History: 1 ?Age Score: 1 ?Gender Score: 0 ?  ? ?Radiology  ?  ?No results found. ? ?Cardiac Studies  ? ?Echo 01/13/22: ?1. Left ventricular ejection fraction, by estimation, is 55 to 60%. The  ?left  ventricle has normal function. The left ventricle has no regional  ?wall motion abnormalities. Left ventricular diastolic function could not  ?be evaluated.  ? 2. Right ventricular systolic function is normal. The right ventricular  ?size is normal.  ? 3. Left atrial size was severely dilated.  ? 4. Right atrial size was severely dilated.  ? 5. The mitral valve is normal in structure. Mild mitral valve  ?regurgitation. No evidence of mitral stenosis.  ? 6. The aortic valve is normal in structure. Aortic valve regurgitation is  ?not visualized. Aortic valve sclerosis is present, with no evidence of  ?aortic valve stenosis.  ? 7. The inferior vena cava is normal in size with greater than 50%  ?respiratory variability, suggesting right atrial pressure of 3 mmHg.  ?  ? ?Patient Profile  ?   ?76 y.o. male with a hx of metastatic prostate cancer, COPD, CAD s/p CABG, chronic AF, CKD, arthritis, chronic diastolic heart failure, and hypertension who is being seen 01/19/2022 for the evaluation of CHF at the request of Dr. Nevada Crane. ? ?Assessment & Plan  ?  ?Acute on chronic diastolic heart failure ?CKD ?- recent echo with preserved EF, mild MR ?- Recent hospital admission with nausea and vomiting and diarrhea presumed secondary to recent XRT treatments for prostate cancer.  He received IV fluid resuscitation at that time ?- Now admitted with volume overload ?- He has gotten several doses of IV diuretics and put out 1.3 L yesterday and is net -5.82 L . ?- Weight is down 5 pounds from admission Yesterday but not done today ?- Serum creatinine Remained stable at 1.01 potassium 4.6 today ?- crackles in lungs have resolved ?- weight 160lbs at last OV with Dr. Lovena Le in January and now 158lbs so I think he is euvolemic  ?- Decrease Lasix to 20 mg daily to allow for more BP room to titrate rate controlling drugs (he was  taking 20 mg as needed for edema at home) ? ?Chronic atrial fibrillation ?Hx of stroke on eliquis ?Chronic  anticoagulation ?- Heart rate improved but still in the low 100s ?-Started on IV Cardizem drip yesterday but systolic blood pressure dropped into the 90s and it was discontinued. ?- Continue Eliquis 5 mg twice daily  ?- Increase Lopressor to 37.5 mg twice daily ?  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  EKG is nonischemic and at bedtime troponin negative x2 ?-no further ischemic workup at this time unless he has recurrent CP when euvolemic and controlled HR ?-continue aspirin 81 mg daily, metoprolol tartrate 25 mg twice daily ?-He is statin intolerant and is on Repatha ?   ? ?For questions or updates, please contact [place] ?Please consult [email or web address] for contact info under  ? ?  ?   ?Signed, ?Fransico Him, MD  ?01/21/2022, 11:31 AM    ?

## 2022-01-21 NOTE — TOC Progression Note (Signed)
Transition of Care (TOC) - Progression Note  ? ? ?Patient Details  ?Name: Ryan [name] ?MRN: [id number] ?Date of Birth: 1946-06-24 ? ?Transition of Care (TOC) CM/SW Contact  ?Purcell Mouton, RN ?Phone Number: ?01/21/2022, 4:06 PM ? ?Clinical Narrative:    ? ? ?Transition of Care (TOC) Screening Note ? ? ?Patient Details  ?Name: Ryan [name] ?Date of Birth: 03/10/1946 ? ? ?Transition of Care (TOC) CM/SW Contact:    ?Purcell Mouton, RN ?Phone Number: ?01/21/2022, 4:06 PM ? ? ? ?Transition of Care Department [facility]) has reviewed patient and no TOC needs have been identified at this time. We will continue to monitor patient advancement through interdisciplinary progression rounds. If new patient transition needs arise, please place a TOC consult. ?  ? ?  ?  ? ?Expected Discharge Plan and Services ?  ?  ?  ?  ?  ?                ?  ?  ?  ?  ?  ?  ?  ?  ?  ?  ? ? ?Social Determinants of Health (SDOH) Interventions ?  ? ?Readmission Risk Interventions ? ?  01/12/2022  ?  8:35 AM 08/01/2021  ? 10:18 AM  ?Readmission Risk Prevention Plan  ?Transportation Screening Complete Complete  ?PCP or Specialist Appt within 3-5 Days Complete Complete  ?[place] or Home Care Consult Complete Complete  ?Social Work Consult for [place] Planning/Counseling  Complete  ?Palliative Care Screening Not Applicable Not Applicable  ?Medication Review Press photographer)  Complete  ? ? ?

## 2022-01-21 NOTE — Consult Note (Signed)
[facility] CM Inpatient Consult ? ? ?01/21/2022 ? ?[name] ?[birth year] ?[id number] ? ?[place] [facility] CM) ? ?Patient chart reviewed due to high risk score unplanned readmission and less than 30 days unplanned readmission. Assessed for post hospital chronic care management needs.  ? ?Per review, patient's primary provider office offers embedded chronic case management team and is listed for the transition of care follow up and appointments. ?  ?Plan: Continue to follow for progression and disposition plans. ? ?Of note, [facility] Care Management services does not replace or interfere with any services that are arranged by inpatient case management or social work.  ? ?Netta Cedars, MSN, RN ?[facility] Liaison ?Phone [phone] ?Toll free office [phone]  ?

## 2022-01-21 NOTE — Progress Notes (Signed)
[facility] ?PROGRESS NOTE ? ? ? ?Progress Note  ?Ryan [name]  PPJ:[id number] DOB: 19-Dec-1945 DOA: 01/16/2022 ?PCP: [name], MD  ? ? ? ?Brief Narrative:  ? ?Ryan [name] is an 76 y.o. male past medical history significant for chronic diastolic heart failure, atrial fibrillation on Eliquis CAD status post CABG, history of lymphocytic microscopic colitis, prostate cancer undergoing radiation therapy, recently discharged from the hospital on 01/15/2022 for nausea vomiting diarrhea met SIRS criteria started on antibiotics work-up was unrevealing GI started him on budesonide, C. difficile PCR was negative GI panel collected showed no evidence of pathogens.  There was a question of possible radiation induced colitis, radiation oncology was consulted during that admission and radiation treatments were held and plan to resume once patient is stable as an outpatient.  His Lasix was held and he was told to resume his Lasix as an outpatient.  Presents with shortness of breath that started the day prior to admission with tightness and swelling of his lower extremities.  On admission he was hypoxic at 87% was started on nasal cannula BNP of 600 CT angio of the chest showed no PE but showed bilateral pulmonary infiltrates.  We will start her on IV Lasix. ? ? ? ?Assessment/Plan:  ? ?Acute respiratory failure with hypoxia possibly due to preserved ejection fraction heart failure: ?Chest x-ray showed bilateral opacities with a BNP of 600 recent 2D echo is on 01/13/2022 showed EF of 58% but no diastolic dysfunction identified ?He was started on 2 L nasal cannula to keep saturations greater than 88%. ?Was started on IV diuresis, and he is negative about 5 L. ?Continue strict I's and O's and daily weights. ?Monitor electrolytes and replete as needed. ?Continue beta-blockers. ?Physical therapy has been consulted. ?Wean to room air out of bed to chair. ? ?Nausea vomiting and diarrhea: ?Now resolved on budesonide treatment  with follow-up with GI as an outpatient. ? ?History of prostate cancer: ?Followed by Dr. Diona Fanti and Dr. 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 ? ?Blood-streaked sputum: ?Slowly improving probably due to pulmonary edema in the setting of Eliquis. ? ?Intermittent atrial fibrillation: ?On Eliquis for secondary prevention of CVA. ?Started on a Cardizem drip by cardiology on 01/20/2022 and Lopressor twice a day. ?Further management per cardiology. ?Patient relates he was taken off Rythmol in January, since then he has been having these episodes of palpitations was making me anxious. ? ?Coronary artery disease status post CABG in 2022. ?Continue aspirin, metoprolol and Repatha along with statins. ? ? ?DVT prophylaxis: Eliquis ?Family Communication:none ?Status is: Inpatient ?Remains inpatient appropriate because: A-fib with RVR and heart failure. ? ? ? ?Code Status:  ? ?  ?Code Status Orders  ?(From admission, onward)  ?  ? ? ?  ? ?  Start     Ordered  ? 01/17/22 1013  Full code  Continuous       ? 01/17/22 1016  ? ?  ?  ? ?  ? ?Code Status History   ? ? Date Active Date Inactive Code Status Order ID Comments User Context  ? 01/11/2022 2125 01/15/2022 2035 Full Code [id number]  [name]., MD Inpatient  ? 07/20/2021 1808 08/01/2021 1626 Full Code [id number]  [name], DO Inpatient  ? 07/17/2021 0934 07/18/2021 1348 Full Code [id number]  [name], MD Inpatient  ? 03/23/2021 1619 03/25/2021 2210 Full Code [id number]  [name], MD ED  ? 12/14/2020  1221 12/15/2020 1724 Full Code [id number]  [name], MD ED  ? 08/26/2020 2338 08/29/2020 1410 Full Code [id number]  [name], MD ED  ? 04/09/2020 1337 04/14/2020 1928 Full Code [id number]  [name], MD ED  ? 03/22/2018 1358 03/25/2018 1507 Full Code [id number]  [name], MD ED  ? 02/26/2014 0323 02/26/2014 1607 Full Code [id number]  [name], MD Inpatient  ? ?   ? ?Advance Directive Documentation   ? ?Flowsheet Row Most Recent Value  ?Type of Advance Directive Healthcare Power of Corder, Living will  ?Pre-existing out of facility DNR order (yellow form or pink MOST form) --  ?"MOST" Form in Place? --  ? ?  ? ? ? ? ?IV Access:  ? ?Peripheral IV ? ? ?Procedures and diagnostic studies:  ? ?No results found. ? ? ?Medical Consultants:  ? ?None. ? ? ?Subjective:  ? ? ?Ryan [name] he continues to have episodes of palpitations he relates he has no orthopnea breathing is improved. ? ?Objective:  ? ? ?Vitals:  ? 01/20/22 1703 01/20/22 2105 01/20/22 2119 01/21/22 0610  ?BP: 109/67 123/81  119/69  ?Pulse: (!) 55 (!) 49 97 87  ?Resp: _0 ?Temp: 98.2 ?F (36.8 ?C) 98.2 ?F (36.8 ?C)  98 ?F (36.7 ?C)  ?TempSrc: Oral Oral  Oral  ?SpO2: 95% 93%  95%  ?Weight:      ?Height:      ? ?SpO2: 95 % ?O2 Flow Rate (L/min): 1 L/min ? ? ?Intake/Output Summary (Last 24 hours) at 01/21/2022 1021 ?Last data filed at 01/21/2022 0600 ?Gross per 24 hour  ?Intake 675.29 ml  ?Output 1300 ml  ?Net -624.71 ml  ? ?Filed Weights  ? 01/18/22 0304 01/19/22 0436 01/20/22 0500  ?Weight: 69.4 kg 71.7 kg 71.8 kg  ? ? ?Exam: ?General exam: In no acute distress. ?Respiratory system: Good air movement and clear to auscultation. ?Cardiovascular system: S1 & S2 heard, RRR. No JVD. ?Gastrointestinal system: Abdomen is nondistended, soft and nontender.  ?Extremities: No pedal edema. ?Skin: No rashes, lesions or ulcers ?Psychiatry: Judgement and insight appear normal. Mood & affect appropriate.  ? ? ?Data Reviewed:  ? ? ?Labs: ?Basic Metabolic Panel: ?Recent Labs  ?Lab 01/17/22 ?0251 01/17/22 ?5638 01/18/22 ?0355 01/19/22 ?0359 01/20/22 ?9373 01/21/22 ?0424  ?NA 141  --  140 139 135 136  ?K 3.3*  --  4.2 4.3 4.0 4.6  ?CL 105  --  102 102 97* 100  ?CO2 26  --  _1 ?GLUCOSE 94  --  98 101* 102* 105*  ?BUN 16  --  18 24* 24* 30*  ?CREATININE 0.82  --  0.90 0.99 0.88 1.01  ?CALCIUM 8.7*  --  8.9 9.0 9.0 9.4  ?MG   --  1.1* 1.8 1.5* 1.7 1.8  ?PHOS  --   --   --   --   --  3.8  ? ?GFR ?Estimated Creatinine Clearance: 57.4 mL/min (by C-G formula based on SCr of 1.01 mg/dL). ?Liver Function Tests: ?Recent Labs  ?Lab 01/16/22 ?2240  ?AST 25  ?ALT 20  ?ALKPHOS 50  ?BILITOT 0.6  ?PROT 6.3*  ?ALBUMIN 2.8*  ? ?No results for input(s): LIPASE, AMYLASE in the last 168 hours. ?No results for input(s): AMMONIA in the last 168 hours. ?Coagulation profile ?No results for input(s): INR, PROTIME in the last 168 hours. ?COVID-19 Labs ? ?No results for input(s): DDIMER, FERRITIN, LDH, CRP in  the last 72 hours. ? ?Lab Results  ?Component Value Date  ? [place] NEGATIVE 01/11/2022  ? [place] NEGATIVE 07/20/2021  ? SARSCOV2NAA RESULT: NEGATIVE 07/14/2021  ? [place] NEGATIVE 03/27/2021  ? ? ?CBC: ?Recent Labs  ?Lab 01/16/22 ?2240 01/17/22 ?9518 01/21/22 ?0424  ?WBC 7.0 7.1 8.0  ?NEUTROABS 5.2  --   --   ?HGB 8.7* 8.9* 10.0*  ?HCT 25.5* 26.0* 30.4*  ?MCV 87.9 88.1 88.9  ?PLT 276 308 304  ? ?Cardiac Enzymes: ?No results for input(s): CKTOTAL, CKMB, CKMBINDEX, TROPONINI in the last 168 hours. ?BNP (last 3 results) ?No results for input(s): PROBNP in the last 8760 hours. ?CBG: ?No results for input(s): GLUCAP in the last 168 hours. ?D-Dimer: ?No results for input(s): DDIMER in the last 72 hours. ?Hgb A1c: ?No results for input(s): HGBA1C in the last 72 hours. ?Lipid Profile: ?No results for input(s): CHOL, HDL, LDLCALC, TRIG, CHOLHDL, LDLDIRECT in the last 72 hours. ?Thyroid function studies: ?Recent Labs  ?  01/19/22 ?0359  ?TSH 2.040  ? ?Anemia work up: ?No results for input(s): VITAMINB12, FOLATE, FERRITIN, TIBC, IRON, RETICCTPCT in the last 72 hours. ?Sepsis Labs: ?Recent Labs  ?Lab 01/16/22 ?2240 01/17/22 ?0251 01/17/22 ?0845 01/17/22 ?1902 01/21/22 ?0424  ?WBC 7.0  --  7.1  --  8.0  ?LATICACIDVEN  --  1.2  --  1.3  --   ? ?Microbiology ?Recent Results (from the past 240 hour(s))  ?Urine Culture     Status: None  ? Collection Time:  01/11/22  2:45 PM  ? Specimen: Urine, Clean Catch  ?Result Value Ref Range Status  ? Specimen Description   Final  ?  URINE, CLEAN CATCH ?Performed at [facility], [place] Barbara Cower., Valene Bors

## 2022-01-21 NOTE — Care Management Important Message (Signed)
Important Message ? ?Patient Details IM Letter placed in Patients room. ?Name: Ryan [name] ?MRN: [id number] ?Date of Birth: [birth year] ? ? ?Medicare Important Message Given:  Yes ? ? ? ? ?Kerin Salen ?01/21/2022, 1:18 PM ?

## 2022-01-22 ENCOUNTER — Ambulatory Visit: Payer: Medicare Other

## 2022-01-22 DIAGNOSIS — K520 Gastroenteritis and colitis due to radiation: Secondary | ICD-10-CM | POA: Diagnosis not present

## 2022-01-22 DIAGNOSIS — I509 Heart failure, unspecified: Secondary | ICD-10-CM | POA: Diagnosis not present

## 2022-01-22 DIAGNOSIS — J9601 Acute respiratory failure with hypoxia: Secondary | ICD-10-CM | POA: Diagnosis not present

## 2022-01-22 DIAGNOSIS — R042 Hemoptysis: Secondary | ICD-10-CM | POA: Diagnosis not present

## 2022-01-22 LAB — CULTURE, BLOOD (ROUTINE X 2)
Culture: NO GROWTH
Culture: NO GROWTH
Special Requests: ADEQUATE

## 2022-01-22 LAB — MAGNESIUM: Magnesium: 1.6 mg/dL — ABNORMAL LOW (ref 1.7–2.4)

## 2022-01-22 MED ORDER — METOPROLOL TARTRATE 50 MG PO TABS
50.0000 mg | ORAL_TABLET | Freq: Two times a day (BID) | ORAL | Status: DC
  Administered 2022-01-22 – 2022-01-23 (×2): 50 mg via ORAL
  Filled 2022-01-22 (×2): qty 1

## 2022-01-22 MED ORDER — FUROSEMIDE 20 MG PO TABS
20.0000 mg | ORAL_TABLET | Freq: Every day | ORAL | Status: DC | PRN
  Filled 2022-01-22: qty 1

## 2022-01-22 MED ORDER — METOPROLOL TARTRATE 25 MG PO TABS
12.5000 mg | ORAL_TABLET | Freq: Once | ORAL | Status: AC
  Administered 2022-01-22: 12.5 mg via ORAL

## 2022-01-22 NOTE — Progress Notes (Signed)
[facility] ?PROGRESS NOTE ? ? ? ?Progress Note  ?Ryan [name]  NWG:[id number] DOB: [birth year] DOA: 01/16/2022 ?PCP: Ryan Lukes, MD  ? ? ? ?Brief Narrative:  ? ?Ryan [name] is an 76 y.o. male past medical history significant for chronic diastolic heart failure, atrial fibrillation on Eliquis CAD status post CABG, history of lymphocytic microscopic colitis, prostate cancer undergoing radiation therapy, recently discharged from the hospital on 01/15/2022 for nausea vomiting diarrhea met SIRS criteria started on antibiotics work-up was unrevealing GI started him on budesonide, C. difficile PCR was negative GI panel collected showed no evidence of pathogens.  There was a question of possible radiation induced colitis, radiation oncology was consulted during that admission and radiation treatments were held and plan to resume once patient is stable as an outpatient.  His Lasix was held and he was told to resume his Lasix as an outpatient.  Presents with shortness of breath that started the day prior to admission with tightness and swelling of his lower extremities.  On admission he was hypoxic at 87% was started on nasal cannula BNP of 600 CT angio of the chest showed no PE but showed bilateral pulmonary infiltrates.   ? ? ? ?Assessment/Plan:  ? ?Acute respiratory failure with hypoxia possibly due to preserved ejection fraction heart failure: ?Chest x-ray showed bilateral opacities with a BNP of 600 recent 2D echo is on 01/13/2022 showed EF of 57% but no diastolic dysfunction identified ?Has been weaned to room air. ?He was started on IV Lasix he is negative about 5-1/2 L, now has been changed to 20 mg daily. ?Continue strict I's and O's and daily weights. ?Monitor electrolytes and replete as needed. ?Continue beta-blockers. ?Physical therapy has been consulted they recommended no follow-up. ?Further management per cardiology. ? ?Intermittent atrial fibrillation: ?On Eliquis for secondary prevention of  CVA. ?Started on a Cardizem drip by cardiology on 01/20/2022 and Lopressor twice a day. ?Became hypotensive now on Lopressor 35.5 mg twice a day. ?Heart rate this morning has been ranging 90-1 10 ?Continue Eliquis. ?Further management per cardiology. ? ?Nausea vomiting and diarrhea: ?Now resolved on budesonide treatment with follow-up with GI as an outpatient. ? ?History of prostate cancer: ?Followed by Dr. Diona Fanti and Dr. 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 ? ?Blood-streaked sputum: ?Slowly improving probably due to pulmonary edema in the setting of Eliquis. ? ?Coronary artery disease status post CABG in 2022. ?Continue aspirin, metoprolol and Repatha.  He is statin intolerant. ? ? ?DVT prophylaxis: Eliquis ?Family Communication:none ?Status is: Inpatient ?Remains inpatient appropriate because: A-fib with RVR and heart failure. ? ? ? ?Code Status:  ? ?  ?Code Status Orders  ?(From admission, onward)  ?  ? ? ?  ? ?  Start     Ordered  ? 01/17/22 1013  Full code  Continuous       ? 01/17/22 1016  ? ?  ?  ? ?  ? ?Code Status History   ? ? Date Active Date Inactive Code Status Order ID Comments User Context  ? 01/11/2022 2125 01/15/2022 2035 Full Code [id number]  Ryan [name]., MD Inpatient  ? 07/20/2021 1808 08/01/2021 1626 Full Code [id number]  Ryan Finner, DO Inpatient  ? 07/17/2021 0934 07/18/2021 1348 Full Code [id number]  Ryan Gallo, MD Inpatient  ? 03/23/2021 1619 03/25/2021 2210 Full Code [id number]  Ryan Halt, MD ED  ? 12/14/2020 1221 12/15/2020 1724 Full Code [id number]  Ryan Bongo, MD ED  ? 08/26/2020 2338 08/29/2020 1410 Full Code [id number]  Ryan Bruins, MD ED  ? 04/09/2020 1337 04/14/2020 1928 Full Code [id number]  Ryan Morton, MD ED  ? 03/22/2018 1358 03/25/2018 1507 Full Code [id number]  Ryan Shell, MD ED  ? 02/26/2014 0323 02/26/2014 1607 Full Code [id number]  Ryan Patience, MD Inpatient  ? ?  ? ?Advance  Directive Documentation   ? ?Flowsheet Row Most Recent Value  ?Type of Advance Directive Healthcare Power of [place], Living will  ?Pre-existing out of facility DNR order (yellow form or pink MOST form) --  ?"MOST" Form in Place? --  ? ?  ? ? ? ? ?IV Access:  ? ?Peripheral IV ? ? ?Procedures and diagnostic studies:  ? ?No results found. ? ? ?Medical Consultants:  ? ?None. ? ? ?Subjective:  ? ? ?[name] he relates his palpitations is resolved denies any symptomatology ? ?Objective:  ? ? ?Vitals:  ? 01/21/22 1320 01/21/22 1856 01/21/22 2102 01/22/22 0401  ?BP: 113/67  120/70 123/75  ?Pulse: 97  (!) 104 (!) 101  ?Resp: _0 ?Temp: 98.8 ?F (37.1 ?C)  97.8 ?F (36.6 ?C) 98.6 ?F (37 ?C)  ?TempSrc: Oral  Oral Oral  ?SpO2: 96% 94% 95% 95%  ?Weight:    70.1 kg  ?Height:      ? ?SpO2: 95 % ?O2 Flow Rate (L/min): 1 L/min ? ? ?Intake/Output Summary (Last 24 hours) at 01/22/2022 0855 ?Last data filed at 01/22/2022 0437 ?Gross per 24 hour  ?Intake 1080 ml  ?Output 825 ml  ?Net 255 ml  ? ? ?Filed Weights  ? 01/19/22 0436 01/20/22 0500 01/22/22 0401  ?Weight: 71.7 kg 71.8 kg 70.1 kg  ? ? ?Exam: ?General exam: In no acute distress. ?Respiratory system: Good air movement and clear to auscultation. ?Cardiovascular system: S1 & S2 heard, RRR. No JVD. ?Gastrointestinal system: Abdomen is nondistended, soft and nontender.  ?Extremities: No pedal edema. ?Skin: No rashes, lesions or ulcers ?Psychiatry: Judgement and insight appear normal. Mood & affect appropriate. ? ? ?Data Reviewed:  ? ? ?Labs: ?Basic Metabolic Panel: ?Recent Labs  ?Lab 01/17/22 ?0251 01/17/22 ?2993 01/18/22 ?0355 01/19/22 ?0359 01/20/22 ?7169 01/21/22 ?0424 01/22/22 ?0358  ?NA 141  --  140 139 135 136  --   ?K 3.3*  --  4.2 4.3 4.0 4.6  --   ?CL 105  --  102 102 97* 100  --   ?CO2 26  --  _1 --   ?GLUCOSE 94  --  98 101* 102* 105*  --   ?BUN 16  --  18 24* 24* 30*  --   ?CREATININE 0.82  --  0.90 0.99 0.88 1.01  --   ?CALCIUM 8.7*  --  8.9 9.0 9.0  9.4  --   ?MG  --    < > 1.8 1.5* 1.7 1.8 1.6*  ?PHOS  --   --   --   --   --  3.8  --   ? < > = values in this interval not displayed.  ? ? ?GFR ?Estimated Creatinine Clearance: 52.9 mL/min (by C-G formula based on SCr of 1.01 mg/dL). ?Liver Function Tests: ?Recent Labs  ?Lab 01/16/22 ?2240  ?AST 25  ?ALT 20  ?ALKPHOS 50  ?BILITOT 0.6  ?PROT 6.3*  ?ALBUMIN 2.8*  ? ? ?No results for input(s): LIPASE, AMYLASE in the last 168 hours. ?No results  for input(s): AMMONIA in the last 168 hours. ?Coagulation profile ?No results for input(s): INR, PROTIME in the last 168 hours. ?COVID-19 Labs ? ?No results for input(s): DDIMER, FERRITIN, LDH, CRP in the last 72 hours. ? ?Lab Results  ?Component Value Date  ? [place] NEGATIVE 01/11/2022  ? [place] NEGATIVE 07/20/2021  ? SARSCOV2NAA RESULT: NEGATIVE 07/14/2021  ? [place] NEGATIVE 03/27/2021  ? ? ?CBC: ?Recent Labs  ?Lab 01/16/22 ?2240 01/17/22 ?7619 01/21/22 ?0424  ?WBC 7.0 7.1 8.0  ?NEUTROABS 5.2  --   --   ?HGB 8.7* 8.9* 10.0*  ?HCT 25.5* 26.0* 30.4*  ?MCV 87.9 88.1 88.9  ?PLT 276 308 304  ? ? ?Cardiac Enzymes: ?No results for input(s): CKTOTAL, CKMB, CKMBINDEX, TROPONINI in the last 168 hours. ?BNP (last 3 results) ?No results for input(s): PROBNP in the last 8760 hours. ?CBG: ?No results for input(s): GLUCAP in the last 168 hours. ?D-Dimer: ?No results for input(s): DDIMER in the last 72 hours. ?Hgb A1c: ?No results for input(s): HGBA1C in the last 72 hours. ?Lipid Profile: ?No results for input(s): CHOL, HDL, LDLCALC, TRIG, CHOLHDL, LDLDIRECT in the last 72 hours. ?Thyroid function studies: ?No results for input(s): TSH, T4TOTAL, T3FREE, THYROIDAB in the last 72 hours. ? ?Invalid input(s): FREET3 ? ?Anemia work up: ?No results for input(s): VITAMINB12, FOLATE, FERRITIN, TIBC, IRON, RETICCTPCT in the last 72 hours. ?Sepsis Labs: ?Recent Labs  ?Lab 01/16/22 ?2240 01/17/22 ?0251 01/17/22 ?0845 01/17/22 ?1902 01/21/22 ?0424  ?WBC 7.0  --  7.1  --  8.0   ?LATICACIDVEN  --  1.2  --  1.3  --   ? ? ?Microbiology ?Recent Results (from the past 240 hour(s))  ?C Difficile Quick Screen w PCR reflex     Status: None  ? Collection Time: 01/12/22 11:55 AM  ? Specimen: STOOL  ?Result Valu

## 2022-01-22 NOTE — Progress Notes (Addendum)
Follow up with cardiology arranged on 02/05/22, unable get appt with [name] in 1 week and arranged A fib clinic appointment on 01/27/22 per Dr Radford Pax request, see AVS ?

## 2022-01-22 NOTE — Progress Notes (Signed)
? ?Progress Note ? ?Patient Name: Ryan [name] ?Date of Encounter: 01/22/2022 ? ?[facility] Cardiologist: Cristopher Peru, MD  ? ?Subjective  ? ?Doing well denies any chest pain or shortness of breath.  No palpitations. ? ?Inpatient Medications  ?  ?Scheduled Meds: ? apixaban  5 mg Oral BID  ? aspirin EC  81 mg Oral Daily  ? brimonidine  1 drop Left Eye BID  ? budesonide  9 mg Oral Daily  ? Ferrous Fumarate  106 mg of iron Oral QHS  ? furosemide  20 mg Oral Daily  ? latanoprost  1 drop Left Eye QHS  ? magnesium oxide  400 mg Oral BID  ? mouth rinse  15 mL Mouth Rinse BID  ? metoprolol tartrate  37.5 mg Oral BID  ? pantoprazole  40 mg Oral Daily  ? sodium chloride flush  3 mL Intravenous Q12H  ? tamsulosin  0.4 mg Oral BID  ? ?Continuous Infusions: ? sodium chloride    ? ?PRN Meds: ?sodium chloride, acetaminophen, clobetasol cream, levalbuterol, loperamide, metoprolol tartrate, morphine injection, ondansetron (ZOFRAN) IV, oxyCODONE-acetaminophen, sodium chloride flush  ? ?Vital Signs  ?  ?Vitals:  ? 01/21/22 1320 01/21/22 1856 01/21/22 2102 01/22/22 0401  ?BP: 113/67  120/70 123/75  ?Pulse: 97  (!) 104 (!) 101  ?Resp: 18  18 18   ?Temp: 98.8 ?F (37.1 ?C)  97.8 ?F (36.6 ?C) 98.6 ?F (37 ?C)  ?TempSrc: Oral  Oral Oral  ?SpO2: 96% 94% 95% 95%  ?Weight:    70.1 kg  ?Height:      ? ? ?Intake/Output Summary (Last 24 hours) at 01/22/2022 1053 ?Last data filed at 01/22/2022 0437 ?Gross per 24 hour  ?Intake 720 ml  ?Output 825 ml  ?Net -105 ml  ? ? ? ?  01/22/2022  ?  4:01 AM 01/20/2022  ?  5:00 AM 01/19/2022  ?  4:36 AM  ?Last 3 Weights  ?Weight (lbs) 154 lb 8.7 oz 158 lb 4.6 oz 158 lb 1.1 oz  ?Weight (kg) 70.1 kg 71.8 kg 71.7 kg  ?   ? ?Telemetry  ?  ?Atrial fibrillation with controlled ventricular response with only 1 or 2 episodes up into the 130s- Personally Reviewed ? ?ECG  ?  ?No new EKG to review - Personally Reviewed ? ?Physical Exam  ? ?GEN: Well nourished, well developed in no acute distress ?HEENT: Normal ?NECK: No  JVD; No carotid bruits ?LYMPHATICS: No lymphadenopathy ?CARDIAC: Irregular irregular, no murmurs, rubs, gallops ?RESPIRATORY:  Clear to auscultation without rales, wheezing or rhonchi  ?ABDOMEN: Soft, non-tender, non-distended ?MUSCULOSKELETAL:  No edema; No deformity  ?SKIN: Warm and dry ?NEUROLOGIC:  Alert and oriented x 3 ?PSYCHIATRIC:  Normal affect   ?Labs  ?  ?High Sensitivity Troponin:   ?Recent Labs  ?Lab 01/11/22 ?0959 01/11/22 ?1141 01/16/22 ?2240 01/17/22 ?0251  ?TROPONINIHS 11 11 11 12   ? ?   ? ?Chemistry ?Recent Labs  ?Lab 01/16/22 ?2240 01/17/22 ?0251 01/19/22 ?0359 01/20/22 ?2376 01/21/22 ?0424  ?NA 142   < > 139 135 136  ?K 3.4*   < > 4.3 4.0 4.6  ?CL 111   < > 102 97* 100  ?CO2 23   < > 30 28 27   ?GLUCOSE 111*   < > 101* 102* 105*  ?BUN 18   < > 24* 24* 30*  ?CREATININE 0.80   < > 0.99 0.88 1.01  ?CALCIUM 8.6*   < > 9.0 9.0 9.4  ?PROT 6.3*  --   --   --   --   ?  ALBUMIN 2.8*  --   --   --   --   ?AST 25  --   --   --   --   ?ALT 20  --   --   --   --   ?ALKPHOS 50  --   --   --   --   ?BILITOT 0.6  --   --   --   --   ?GFRNONAA >60   < > >60 >60 >60  ?ANIONGAP 8   < > 7 10 9   ? < > = values in this interval not displayed.  ? ?  ? ?Hematology ?Recent Labs  ?Lab 01/16/22 ?2240 01/17/22 ?0076 01/21/22 ?0424  ?WBC 7.0 7.1 8.0  ?RBC 2.90* 2.95* 3.42*  ?HGB 8.7* 8.9* 10.0*  ?HCT 25.5* 26.0* 30.4*  ?MCV 87.9 88.1 88.9  ?MCH 30.0 30.2 29.2  ?MCHC 34.1 34.2 32.9  ?RDW 14.6 14.7 14.4  ?PLT 276 308 304  ? ? ? ?BNP ?Recent Labs  ?Lab 01/16/22 ?2240  ?BNP 605.1*  ? ?  ? ?DDimer No results for input(s): DDIMER in the last 168 hours. ? ? ?CHA2DS2-VASc Score = 6  ?This indicates a 9.7% annual risk of stroke. ?The patient's score is based upon: ?CHF History: 1 ?HTN History: 1 ?Diabetes History: 0 ?Stroke History: 2 ?Vascular Disease History: 1 ?Age Score: 1 ?Gender Score: 0 ?  ? ?Radiology  ?  ?No results found. ? ?Cardiac Studies  ? ?Echo 01/13/22: ?1. Left ventricular ejection fraction, by estimation, is 55 to 60%.  The  ?left ventricle has normal function. The left ventricle has no regional  ?wall motion abnormalities. Left ventricular diastolic function could not  ?be evaluated.  ? 2. Right ventricular systolic function is normal. The right ventricular  ?size is normal.  ? 3. Left atrial size was severely dilated.  ? 4. Right atrial size was severely dilated.  ? 5. The mitral valve is normal in structure. Mild mitral valve  ?regurgitation. No evidence of mitral stenosis.  ? 6. The aortic valve is normal in structure. Aortic valve regurgitation is  ?not visualized. Aortic valve sclerosis is present, with no evidence of  ?aortic valve stenosis.  ? 7. The inferior vena cava is normal in size with greater than 50%  ?respiratory variability, suggesting right atrial pressure of 3 mmHg.  ?  ? ?Patient Profile  ?   ?76 y.o. male with a hx of metastatic prostate cancer, COPD, CAD s/p CABG, chronic AF, CKD, arthritis, chronic diastolic heart failure, and hypertension who is being seen 01/19/2022 for the evaluation of CHF at the request of Dr. Nevada Crane. ? ?Assessment & Plan  ?  ?Acute on chronic diastolic heart failure ?CKD ?- recent echo with preserved EF, mild MR ?- Recent hospital admission with nausea and vomiting and diarrhea presumed secondary to recent XRT treatments for prostate cancer.  He received IV fluid resuscitation at that time ?- Now admitted with volume overload ?- He has gotten several doses of IV diuretics and put out a 825 cc yesterday and is net -5.3 L . ?- Weight is down 9 pounds from admission  ?- Serum creatinine Remained stable at 1.01  ?- crackles in lungs have resolved ?- weight 160lbs at last OV with Dr. Lovena Le in January and now 154lbs so I think he is euvolemic  ?-We will change back to Lasix 20 mg as needed for lower extremity edema which was his home dose ? ?Chronic atrial fibrillation ?Hx of  stroke on eliquis ?Chronic anticoagulation ?- Heart rate improved but now maintaining in the 90s with only 1 or 2  episodes where it went up to the 130s ?- Continue Eliquis 5 mg twice daily  ?-Would increase Lopressor to 50 mg twice daily as I think his blood pressure will handle and that should get him adequately rate controlled ?  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  EKG is nonischemic and at bedtime troponin negative x2 ?-no further ischemic workup at this time unless he has recurrent CP when euvolemic and controlled HR ?-continue aspirin 81 mg daily, metoprolol tartrate 25 mg twice daily ?-He is statin intolerant and is on Repatha ? ?[facility] will sign off.   ?Medication Recommendations: Lasix 20 mg as needed for lower extremity edema, Eliquis 5 mg twice daily, aspirin 81 mg daily, Lopressor 50 mg twice daily, Repatha ?Other recommendations (labs, testing, etc): None ?Follow up as an outpatient: Follow-up with Dr. Lovena Le in 1 to 2 weeks ?   ? ?For questions or updates, please contact [place] ?Please consult [email or web address] for contact info under  ? ?  ?   ?Signed, ?Fransico Him, MD  ?01/22/2022, 10:53 AM    ?

## 2022-01-23 ENCOUNTER — Ambulatory Visit: Payer: Medicare Other

## 2022-01-23 DIAGNOSIS — J9601 Acute respiratory failure with hypoxia: Secondary | ICD-10-CM | POA: Diagnosis not present

## 2022-01-23 DIAGNOSIS — I10 Essential (primary) hypertension: Secondary | ICD-10-CM | POA: Diagnosis not present

## 2022-01-23 DIAGNOSIS — R042 Hemoptysis: Secondary | ICD-10-CM | POA: Diagnosis not present

## 2022-01-23 DIAGNOSIS — I509 Heart failure, unspecified: Secondary | ICD-10-CM | POA: Diagnosis not present

## 2022-01-23 LAB — MAGNESIUM: Magnesium: 1.4 mg/dL — ABNORMAL LOW (ref 1.7–2.4)

## 2022-01-23 MED ORDER — METOPROLOL TARTRATE 25 MG PO TABS
75.0000 mg | ORAL_TABLET | Freq: Two times a day (BID) | ORAL | Status: DC
Start: 2022-01-23 — End: 2022-01-23

## 2022-01-23 MED ORDER — METOPROLOL TARTRATE 75 MG PO TABS: 75.0000 mg | ORAL_TABLET | Freq: Two times a day (BID) | ORAL | 3 refills | Status: DC

## 2022-01-23 NOTE — Discharge Summary (Signed)
Physician Discharge Summary  ?Ryan [name] JJO:[id number] DOB: 05/09/1946 DOA: 01/16/2022 ? ?PCP: [name], MD ? ?Admit date: 01/16/2022 ?Discharge date: 01/23/2022 ? ?Admitted From: Home ?Disposition:  Home ? ?Recommendations for Outpatient Follow-up:  ?Follow up with PCP in 1-2 weeks ?Please obtain BMP/CBC in one week ?Follow-up with cardiology in 1 week at the A-fib clinic. ? ?Home Health:No ?Equipment/Devices:None ? ?Discharge Condition:Stable ?CODE STATUS:Full ?Diet recommendation: Heart Healthy ? ?Brief/Interim Summary: ?76 y.o. male past medical history significant for chronic diastolic heart failure, atrial fibrillation on Eliquis CAD status post CABG, history of lymphocytic microscopic colitis, prostate cancer undergoing radiation therapy, recently discharged from the hospital on 01/15/2022 for nausea vomiting diarrhea met SIRS criteria started on antibiotics work-up was unrevealing GI started him on budesonide, C. difficile PCR was negative GI panel collected showed no evidence of pathogens.  There was a question of possible radiation induced colitis, radiation oncology was consulted during that admission and radiation treatments were held and plan to resume once patient is stable as an outpatient.  His Lasix was held and he was told to resume his Lasix as an outpatient.  Presents with shortness of breath that started the day prior to admission with tightness and swelling of his lower extremities.  On admission he was hypoxic at 87% was started on nasal cannula BNP of 600 CT angio of the chest showed no PE but showed bilateral pulmonary infiltrates. ? ?Discharge Diagnoses:  ?Principal Problem: ?  Acute respiratory failure with hypoxia ([place]) ?Active Problems: ?  Heart failure with preserved ejection fraction ([place]) ?  Nausea, vomiting, and diarrhea ?  Essential hypertension ?  Hx of CABG ?  Prostate cancer ([place]) ?  Hyperlipidemia, mixed ?  Macular degeneration (senile) of retina ?  PAF (paroxysmal atrial  fibrillation) ([place]) ?  CAD (coronary artery disease) ?  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  Blood-streaked sputum ? ?Acute respiratory failure with hypoxia possibly due to preserved ejection fraction heart failure: ?He was started on IV Lasix diuresis about 6 L he was weaned to room air. ?Cardiology was consulted recommended to continue Lasix and beta-blockers. ?Physical therapy evaluated the patient recommended home health PT. ?He will continue Lasix at home for lower extremity edema as needed. ? ?Intermittent atrial fibrillation with RVR: ?On Eliquis for secondary stroke prevention. ?Restarted on Cardizem drip he did not tolerate cardiology was consulted recommended to start on metoprolol hemoglobin Toprol 75 and follow-up with the A-fib clinic as an outpatient. ? ?Nausea vomiting and diarrhea: ?Now resolved and began ?Treatment will need to follow-up with GI as an outpatient. ? ?History of prostate cancer: ?Follow-up with Dr. Diona Fanti in [place] as an outpatient for radiation therapy as he is was held due to suspected radiation colitis. ? ?Blood-streaked sputum: ?Slowly improving likely due to pulmonary edema in the setting of Eliquis now resolved. ? ? ?Discharge Instructions ? ?Discharge Instructions   ? ? Diet - low sodium heart healthy   Complete by: As directed ?  ? Increase activity slowly   Complete by: As directed ?  ? ?  ? ?Allergies as of 01/23/2022   ? ?   Reactions  ? Albumin (human) Anaphylaxis  ? Polymyxin B-trimethoprim Swelling  ? Eye drops made eyes swell  ? Pseudoephedrine Other (See Comments)  ? Stomach cramps  ? Codeine Hives, Itching, Rash  ? Guaiacol Other (See Comments)  ? Hallucinations  ? Statins Other (See Comments)  ? Muscle cramps  ? Gabapentin Other (See Comments)  ? Pt  unsure of sensitivity  ? Meloxicam Other (See Comments)  ? Pt unsure of sensitivity  ? Peppermint Flavor Other (See Comments)  ? Severe cramping  ? Pseudoephedrine-guaifenesin Nausea And Vomiting  ? Stomach cramps  ? Rosuvastatin  Calcium Other (See Comments)  ? Muscle aches  ? Tapentadol Other (See Comments)  ? Pt unsure of sensitivity  ? Ciprofloxacin Hives, Itching, Nausea Only, Rash  ? Moxifloxacin Nausea Only, Other (See Comments)  ? Headaches, stomach cramps  ? Rofecoxib Other (See Comments)  ? Stomach cramping  ? ?  ? ?  ?Medication List  ?  ? ?STOP taking these medications   ? ?amoxicillin-clavulanate 875-125 MG tablet ?Commonly known as: Augmentin ?  ? ?  ? ?TAKE these medications   ? ?apixaban 5 MG Tabs tablet ?Commonly known as: ELIQUIS ?Take 1 tablet (5 mg total) by mouth 2 (two) times daily. ?  ?aspirin 81 MG EC tablet ?Take 1 tablet (81 mg total) by mouth daily. Swallow whole. ?  ?brimonidine 0.2 % ophthalmic solution ?Commonly known as: ALPHAGAN ?Place 1 drop into the left eye in the morning and at bedtime. ?  ?budesonide 3 MG 24 hr capsule ?Commonly known as: ENTOCORT EC ?Take 3 capsules (9 mg total) by mouth daily. Take 3 capsules (9 mg total) by mouth once daily ?  ?clobetasol 0.05 % external solution ?Commonly known as: TEMOVATE ?APPLY 1 APPLICATION TOPICALLY TWICE DAILY ?What changed:  ?when to take this ?reasons to take this ?  ?Ferrous Fumarate 324 (106 Fe) MG Tabs tablet ?Commonly known as: Hemocyte ?Take 1 tablet (106 mg of iron total) by mouth daily. ?What changed: when to take this ?  ?furosemide 20 MG tablet ?Commonly known as: LASIX ?TAKE 1 TABLET BY MOUTH DAILY AS  NEEDED ?What changed: when to take this ?  ?latanoprost 0.005 % ophthalmic solution ?Commonly known as: XALATAN ?Place 1 drop into the left eye at bedtime. ?  ?levalbuterol 45 MCG/ACT inhaler ?Commonly known as: XOPENEX HFA ?INHALE 2 PUFFS BY MOUTH INTO THE LUNGS EVERY 4 HOURS AS NEEDED FOR WHEEZING ?What changed: See the new instructions. ?  ?loperamide 2 MG capsule ?Commonly known as: IMODIUM ?TAKE 1 CAPSULE(2 MG) BY MOUTH EVERY 6 HOURS AS NEEDED FOR DIARRHEA OR LOOSE STOOLS ?What changed:  ?how much to take ?how to take this ?when to take  this ?reasons to take this ?additional instructions ?  ?LUCENTIS IO ?Inject 1 Dose into the eye every 6 (six) weeks. Bilateral eye by Dr Gerarda Fraction ?  ?magnesium oxide 400 (240 Mg) MG tablet ?Commonly known as: MAGnesium-Oxide ?Take 1 tablet (400 mg total) by mouth 3 (three) times daily. ?What changed: when to take this ?  ?Metoprolol Tartrate 75 MG Tabs ?Take 75 mg by mouth 2 (two) times daily. ?What changed:  ?medication strength ?how much to take ?  ?nitroGLYCERIN 0.4 MG SL tablet ?Commonly known as: NITROSTAT ?Place 1 tablet (0.4 mg total) under the tongue every 5 (five) minutes as needed for chest pain. ?  ?omeprazole 20 MG tablet ?Commonly known as: PRILOSEC OTC ?Take 20 mg by mouth daily. ?  ?PreserVision AREDS 2 Caps ?Take 1 tablet by mouth daily. ?  ?Repatha SureClick 350 MG/ML [place] ?Generic drug: Evolocumab ?Inject 140 mg into the skin every 14 (fourteen) days. ?What changed: additional instructions ?  ?tamsulosin 0.4 MG Caps capsule ?Commonly known as: FLOMAX ?Take 0.4 mg by mouth 2 (two) times daily. ?  ?vitamin E 180 MG (400 UNITS) capsule ?Take 40,000 Units  by mouth daily. ?  ? ?  ? ? Follow-up Information   ? ? Swinyer, Lanice Schwab, NP Follow up on 02/05/2022.   ?Specialty: Nurse Practitioner ?Why: at 2:20pm for your post hospital follow up with cardiology ?Contact information: ?[address] ?[place] [id number] ?[phone] ? ? ?  ?  ? ? Fenton, Clint R, PA Follow up on 01/27/2022.   ?Specialty: Cardiology ?Why: at 10:30am for A fib follow up ?Contact information: ?[address] ?[place] [address] ?[phone] ? ? ?  ?  ? ?  ?  ? ?  ? ?Allergies  ?Allergen Reactions  ? Albumin (Human) Anaphylaxis  ? Polymyxin B-Trimethoprim Swelling  ?  Eye drops made eyes swell  ? Pseudoephedrine Other (See Comments)  ?  Stomach cramps  ? Codeine Hives, Itching and Rash  ? Guaiacol Other (See Comments)  ?  Hallucinations  ? Statins Other (See Comments)  ?  Muscle cramps ?  ? Gabapentin Other (See  Comments)  ?  Pt unsure of sensitivity  ? Meloxicam Other (See Comments)  ?  Pt unsure of sensitivity  ? Peppermint Flavor Other (See Comments)  ?  Severe cramping  ? Pseudoephedrine-Guaifenesin Nausea And Vomiting

## 2022-01-23 NOTE — Progress Notes (Signed)
Physical Therapy Treatment ?Patient Details ?Name: Ryan [name] ?MRN: [id number] ?DOB: 13-Oct-1945 ?Today's Date: 01/23/2022 ? ? ?History of Present Illness 76 yo male admitted with acute resp failure, acute on chronic HF, Afib. Hx of CAD, CABG, COPD, Afib, prostate ca-radiation therapy ? ?  ?PT Comments  ? ? Pt ambulatory for good distance in hallway with use of rollator, pt overall functioning at supervision to mod I level. Pt maintained SpO2 89% and greater on RA during mobility, does not qualify for home O2 at this point. PT educated pt on energy conservation, monitoring SPO2 at home with pulse ox, and use of rollator for seated rest breaks as needed once home. Pt accepted education, eager to d/c home with support of wife.  ? ?   ?Recommendations for follow up therapy are one component of a multi-disciplinary discharge planning process, led by the attending physician.  Recommendations may be updated based on patient status, additional functional criteria and insurance authorization. ? ?Follow Up Recommendations ? No PT follow up ?  ?  ?Assistance Recommended at Discharge Intermittent Supervision/Assistance  ?Patient can return home with the following Assist for transportation;Assistance with cooking/housework;Help with stairs or ramp for entrance ?  ?Equipment Recommendations ? None recommended by PT  ?  ?Recommendations for Other Services   ? ? ?  ?Precautions / Restrictions Precautions ?Precautions: None ?Precaution Comments: monitor  HR ?Restrictions ?Weight Bearing Restrictions: No  ?  ? ?Mobility ? Bed Mobility ?Overal bed mobility: Modified Independent ?  ?  ?  ?  ?  ?  ?General bed mobility comments: use of HOB elevation ?  ? ?Transfers ?Overall transfer level: Needs assistance ?Equipment used: Rollator (4 wheels) ?Transfers: Sit to/from Stand ?Sit to Stand: Supervision ?  ?  ?  ?  ?  ?General transfer comment: for safety only ?  ? ?Ambulation/Gait ?Ambulation/Gait assistance: Supervision ?Gait Distance  (Feet): 150 Feet ?Assistive device: Rollator (4 wheels) ?Gait Pattern/deviations: Step-through pattern, Decreased stride length, Trunk flexed ?Gait velocity: decr ?  ?  ?General Gait Details: for safety, verbal cuing for upright posture, HR 100-152 bpm during mobility which pt states is normal. No dizziness throughout, SPO2 89% post-gait on RA with DOE 1/4. ? ? ?Stairs ?  ?  ?  ?  ?  ? ? ?Wheelchair Mobility ?  ? ?Modified Rankin (Stroke Patients Only) ?  ? ? ?  ?Balance Overall balance assessment: Mild deficits observed, not formally tested ?  ?  ?  ?  ?  ?  ?  ?  ?  ?  ?  ?  ?  ?  ?  ?  ?  ?  ?  ? ?  ?Cognition Arousal/Alertness: Awake/alert ?Behavior During Therapy: [facility] for tasks assessed/performed ?Overall Cognitive Status: Within Functional Limits for tasks assessed ?  ?  ?  ?  ?  ?  ?  ?  ?  ?  ?  ?  ?  ?  ?  ?  ?  ?  ?  ? ?  ?Exercises General Exercises - Lower Extremity ?Mini-Sqauts: AROM, Both, 10 reps, Seated, Standing ? ?  ?General Comments   ?  ?  ? ?Pertinent Vitals/Pain Pain Assessment ?Pain Assessment: No/denies pain  ? ? ?Home Living   ?  ?  ?  ?  ?  ?  ?  ?  ?  ?   ?  ?Prior Function    ?  ?  ?   ? ?PT Goals (current goals  can now be found in the care plan section) Acute Rehab PT Goals ?Patient Stated Goal: to go home ?PT Goal Formulation: With patient ?Time For Goal Achievement: 02/03/22 ?Potential to Achieve Goals: Good ?Progress towards PT goals: Progressing toward goals ? ?  ?Frequency ? ? ? Min 3X/week ? ? ? ?  ?PT Plan Current plan remains appropriate  ? ? ?Co-evaluation   ?  ?  ?  ?  ? ?  ?AM-PAC PT "6 Clicks" Mobility   ?Outcome Measure ? Help needed turning from your back to your side while in a flat bed without using bedrails?: None ?Help needed moving from lying on your back to sitting on the side of a flat bed without using bedrails?: None ?Help needed moving to and from a bed to a chair (including a wheelchair)?: A Little ?Help needed standing up from a chair using your arms (e.g.,  wheelchair or bedside chair)?: A Little ?Help needed to walk in hospital room?: A Little ?Help needed climbing 3-5 steps with a railing? : A Little ?6 Click Score: 20 ? ?  ?End of Session   ?Activity Tolerance: Patient tolerated treatment well ?Patient left: in bed;with call bell/phone within reach ?Nurse Communication: Mobility status ?PT Visit Diagnosis: Unsteadiness on feet (R26.81);Difficulty in walking, not elsewhere classified (R26.2) ?  ? ? ?Time: 1022-1040 ?PT Time Calculation (min) (ACUTE ONLY): 18 min ? ?Charges:  $Gait Training: 8-22 mins          ?          ? ?Stacie Glaze, PT DPT ?Acute Rehabilitation Services ?Pager [phone]  ?Office [phone] ? ? ?Zuleika Gallus E Stroup ?01/23/2022, 11:59 AM ? ?

## 2022-01-23 NOTE — Progress Notes (Signed)
Occupational Therapy Treatment ?Patient Details ?Name: Ryan [name] ?MRN: [id number] ?DOB: August 30, 1946 ?Today's Date: 01/23/2022 ? ? ?History of present illness 76 yo male admitted with acute resp failure, acute on chronic HF, Afib. Hx of CAD, CABG, COPD, Afib, prostate ca-radiation therapy ?  ?OT comments ? Today patient demonstrates independence with bed mobility and reports independence with ADLs - patient has been allowing him to ambulate without assistance. He used a walker to ambulate in hall with therapist - though it appears he doesn't really need it. His HR up to 150 with ambulation but no complaints of dizziness. He reports being mildly light headed but from lack of mobility. Patient has returned to independence and has no further OT needs.   ? ?Recommendations for follow up therapy are one component of a multi-disciplinary discharge planning process, led by the attending physician.  Recommendations may be updated based on patient status, additional functional criteria and insurance authorization. ?   ?Follow Up Recommendations ? No OT follow up  ?  ?Assistance Recommended at Discharge None  ?Patient can return home with the following ?   ?  ?Equipment Recommendations ? None recommended by OT  ?  ?Recommendations for Other Services   ? ?  ?Precautions / Restrictions Precautions ?Precautions: None ?Precaution Comments: monitor  HR  ? ? ?  ? ?Mobility Bed Mobility ?Overal bed mobility: Independent ?  ?  ?  ?  ?  ?  ?  ?  ? ?Transfers ?Overall transfer level: Modified independent ?Equipment used: Rolling walker (2 wheels) ?  ?  ?  ?  ?  ?  ?  ?General transfer comment: Ambulated in hall with walker. HR up to 150 ?  ?  ?Balance Overall balance assessment: No apparent balance deficits (not formally assessed) ?  ?  ?  ?  ?  ?  ?  ?  ?  ?  ?  ?  ?  ?  ?  ?  ?  ?  ?   ? ?ADL either performed or assessed with clinical judgement  ? ?ADL Overall ADL's : Independent ?  ?  ?  ?  ?  ?  ?  ?  ?  ?  ?  ?  ?  ?  ?  ?  ?  ?   ?  ?General ADL Comments: Patient reports independence with ADLs. Has been getting up without nursing assistance. ?  ? ?Extremity/Trunk Assessment Upper Extremity Assessment ?Upper Extremity Assessment: Overall WFL for tasks assessed ?  ?Lower Extremity Assessment ?Lower Extremity Assessment: Overall WFL for tasks assessed ?  ?Cervical / Trunk Assessment ?Cervical / Trunk Assessment: Normal ?  ? ?Vision Patient Visual Report: No change from baseline ?  ?  ?Perception   ?  ?Praxis   ?  ? ?Cognition Arousal/Alertness: Awake/alert ?Behavior During Therapy: [facility] for tasks assessed/performed ?Overall Cognitive Status: Within Functional Limits for tasks assessed ?  ?  ?  ?  ?  ?  ?  ?  ?  ?  ?  ?  ?  ?  ?  ?  ?  ?  ?  ?   ?Exercises   ? ?  ?Shoulder Instructions   ? ? ?  ?General Comments    ? ? ?Pertinent Vitals/ Pain       Pain Assessment ?Pain Assessment: No/denies pain ? ?Home Living   ?  ?  ?  ?  ?  ?  ?  ?  ?  ?  ?  ?  ?  ?  ?  ?  ?  ?  ? ?  ?  Prior Functioning/Environment    ?  ?  ?  ?   ? ?Frequency ?    ? ? ? ? ?  ?Progress Toward Goals ? ?OT Goals(current goals can now be found in the care plan section) ? Progress towards OT goals: Goals met/education completed, patient discharged from OT ? ?   ?Plan All goals met and education completed, patient discharged from OT services   ? ?Co-evaluation ? ? ?   ?  ?  ?  ?  ? ?  ?AM-PAC OT "6 Clicks" Daily Activity     ?Outcome Measure ? ? Help from another person eating meals?: None ?Help from another person taking care of personal grooming?: None ?Help from another person toileting, which includes using toliet, bedpan, or urinal?: None ?Help from another person bathing (including washing, rinsing, drying)?: None ?Help from another person to put on and taking off regular upper body clothing?: None ?Help from another person to put on and taking off regular lower body clothing?: None ?6 Click Score: 24 ? ?  ?End of Session   ? ?OT Visit Diagnosis: Unsteadiness on feet  (R26.81) ?  ?Activity Tolerance Patient tolerated treatment well ?  ?Patient Left in bed;with call bell/phone within reach;with family/visitor present ?  ?Nurse Communication Mobility status ?  ? ?   ? ?Time: 0037-9444 ?OT Time Calculation (min): 8 min ? ?Charges: OT General Charges ?$OT Visit: 1 Visit ?OT Treatments ?$Therapeutic Activity: 8-22 mins ? ?Myracle Febres, OTR/L ?Acute Care Rehab Services  ?Office [phone] ?Pager: [phone]  ? ?Kassidy Dockendorf L Filip Luten ?01/23/2022, 9:36 AM ?

## 2022-01-23 NOTE — Progress Notes (Signed)
?   01/22/22 2100  ?Level of Consciousness  ?Level of Consciousness Alert  ?MEWS COLOR  ?MEWS Score Color Green  ?PCA/Epidural/Spinal Assessment  ?Respiratory Pattern Regular;Unlabored  ?ECG Monitoring  ?CV Strip Heart Rate 168 ?(RVR HR 168/PT walking - RN Lorriane Shire Aware/Notified)  ?Cardiac Rhythm Atrial fibrillation  ?Glasgow Coma Scale  ?Eye Opening 4  ?Best Verbal Response (NON-intubated) 5  ?Best Motor Response 6  ?Glasgow Coma Scale Score 15  ?MEWS Score  ?MEWS Temp 0  ?MEWS Systolic 0  ?MEWS Pulse 1  ?MEWS RR 0  ?MEWS LOC 0  ?MEWS Score 1  ? ?Pt's HR went up to 168 nonsustained while ambulating in the hall way for short distance. No S/s of respiratory distress and no complaints of chest pain. Given scheduled metoprolol and HR went back to 90-100. ?

## 2022-01-26 ENCOUNTER — Ambulatory Visit
Admission: RE | Admit: 2022-01-26 | Discharge: 2022-01-26 | Disposition: A | Discharge status: Home / Self Care | Payer: Medicare Other | Source: Ambulatory Visit | Attending: Radiation Oncology | Admitting: Radiation Oncology

## 2022-01-26 ENCOUNTER — Other Ambulatory Visit: Payer: Self-pay

## 2022-01-26 DIAGNOSIS — C61 Malignant neoplasm of prostate: Secondary | ICD-10-CM | POA: Diagnosis not present

## 2022-01-26 DIAGNOSIS — I4819 Other persistent atrial fibrillation: Secondary | ICD-10-CM | POA: Diagnosis not present

## 2022-01-26 DIAGNOSIS — D6869 Other thrombophilia: Secondary | ICD-10-CM | POA: Diagnosis not present

## 2022-01-26 DIAGNOSIS — Z191 Hormone sensitive malignancy status: Secondary | ICD-10-CM | POA: Diagnosis not present

## 2022-01-26 DIAGNOSIS — Z510 Encounter for antineoplastic radiation therapy: Secondary | ICD-10-CM | POA: Diagnosis not present

## 2022-01-26 LAB — RAD ONC ARIA SESSION SUMMARY
Course Elapsed Days: 47
Plan Fractions Treated to Date: 1
Plan Prescribed Dose Per Fraction: 2 Gy
Plan Total Fractions Prescribed: 18
Plan Total Prescribed Dose: 36 Gy
Reference Point Dosage Given to Date: 41.6 Gy
Reference Point Session Dosage Given: 2 Gy
Session Number: 23

## 2022-01-27 ENCOUNTER — Ambulatory Visit
Admission: RE | Admit: 2022-01-27 | Discharge: 2022-01-27 | Disposition: A | Discharge status: Home / Self Care | Payer: Medicare Other | Source: Ambulatory Visit | Attending: Radiation Oncology | Admitting: Radiation Oncology

## 2022-01-27 ENCOUNTER — Other Ambulatory Visit: Payer: Self-pay

## 2022-01-27 ENCOUNTER — Ambulatory Visit (HOSPITAL_COMMUNITY): Payer: Medicare Other | Admitting: Physician Assistant

## 2022-01-27 ENCOUNTER — Encounter (HOSPITAL_COMMUNITY): Payer: Self-pay | Admitting: Physician Assistant

## 2022-01-27 ENCOUNTER — Ambulatory Visit (HOSPITAL_BASED_OUTPATIENT_CLINIC_OR_DEPARTMENT_OTHER)
Admission: RE | Admit: 2022-01-27 | Discharge: 2022-01-27 | Disposition: A | Discharge status: Home / Self Care | Payer: Medicare Other | Source: Ambulatory Visit | Attending: Physician Assistant | Admitting: Physician Assistant

## 2022-01-27 VITALS — BP 134/86 | HR 88 | Ht 64.0 in | Wt 155.8 lb

## 2022-01-27 DIAGNOSIS — I4819 Other persistent atrial fibrillation: Secondary | ICD-10-CM

## 2022-01-27 DIAGNOSIS — I130 Hypertensive heart and chronic kidney disease with heart failure and stage 1 through stage 4 chronic kidney disease, or unspecified chronic kidney disease: Secondary | ICD-10-CM | POA: Insufficient documentation

## 2022-01-27 DIAGNOSIS — I5032 Chronic diastolic (congestive) heart failure: Secondary | ICD-10-CM | POA: Insufficient documentation

## 2022-01-27 DIAGNOSIS — C61 Malignant neoplasm of prostate: Secondary | ICD-10-CM | POA: Diagnosis not present

## 2022-01-27 DIAGNOSIS — D6869 Other thrombophilia: Secondary | ICD-10-CM

## 2022-01-27 DIAGNOSIS — I2510 Atherosclerotic heart disease of native coronary artery without angina pectoris: Secondary | ICD-10-CM | POA: Insufficient documentation

## 2022-01-27 DIAGNOSIS — J449 Chronic obstructive pulmonary disease, unspecified: Secondary | ICD-10-CM | POA: Insufficient documentation

## 2022-01-27 DIAGNOSIS — Z7901 Long term (current) use of anticoagulants: Secondary | ICD-10-CM | POA: Insufficient documentation

## 2022-01-27 DIAGNOSIS — N183 Chronic kidney disease, stage 3 unspecified: Secondary | ICD-10-CM | POA: Insufficient documentation

## 2022-01-27 DIAGNOSIS — Z510 Encounter for antineoplastic radiation therapy: Secondary | ICD-10-CM | POA: Diagnosis not present

## 2022-01-27 DIAGNOSIS — Z79899 Other long term (current) drug therapy: Secondary | ICD-10-CM | POA: Insufficient documentation

## 2022-01-27 DIAGNOSIS — Z951 Presence of aortocoronary bypass graft: Secondary | ICD-10-CM | POA: Insufficient documentation

## 2022-01-27 DIAGNOSIS — Z191 Hormone sensitive malignancy status: Secondary | ICD-10-CM | POA: Diagnosis not present

## 2022-01-27 LAB — RAD ONC ARIA SESSION SUMMARY
Course Elapsed Days: 48
Plan Fractions Treated to Date: 2
Plan Prescribed Dose Per Fraction: 2 Gy
Plan Total Fractions Prescribed: 18
Plan Total Prescribed Dose: 36 Gy
Reference Point Dosage Given to Date: 43.6 Gy
Reference Point Session Dosage Given: 2 Gy
Session Number: 24

## 2022-01-27 NOTE — Progress Notes (Signed)
? ? ?Primary Care Physician: [name], MD ?Primary Electrophysiologist: [name] ?Referring Physician: Dr Radford Pax ? ? ?Ryan [name] is a 76 y.o. male with a history of metastatic prostate cancer, COPD, CAD s/p CABG, CKD, arthritis, chronic diastolic heart failure, hypertension, atrial fibrillation who presents for follow up in the [facility]. He was hospitalized 4/9 - 01/15/2022 for nausea, vomiting, and diarrhea after receiving 22 radiation treatments for prostate cancer.  He has a history of microscopic colitis and it was unclear as to whether his symptoms were related to his radiation therapy or his colitis.  He was started on empiric antibiotics for about possible GI source of infection and was started on budesonide.  Hospitalization was complicated by atrial fibrillation with RVR felt related to dehydration from diarrhea and emesis.  This was managed with Cardizem drip which was discontinued at time of discharge. He presented back to the ER 01/16/2022 with volume overload and hypoxia in the mid 80s. He also had RVR during the admission and his home BB was increased. Patient is on Eliquis for a CHADS2VASC score of 6. ? ?On follow up today, patient reports that he feels much better since discharge. He checks his heart rate frequently on pulse oximeter and has not seen any rate over 100 bpm. No bleeding issues on anticoagulation. No symptoms of fluid overload.  ? ?Today, he denies symptoms of palpitations, chest pain, shortness of breath, orthopnea, PND, lower extremity edema, dizziness, presyncope, syncope, snoring, daytime somnolence, bleeding, or neurologic sequela. The patient is tolerating medications without difficulties and is otherwise without complaint today.  ? ? ?Atrial Fibrillation Risk Factors: ? ?he does not have symptoms or diagnosis of sleep apnea. ?he does have a history of rheumatic fever. ? ? ?he has a BMI of Body mass index is 26.74 kg/m?Marland KitchenMarland Kitchen ?Filed Weights  ?  01/27/22 1341  ?Weight: 70.7 kg  ? ? ?Family History  ?Problem Relation Age of Onset  ? Heart disease Mother   ? Diabetes Mother   ? Cirrhosis Mother   ? Emphysema Mother   ?     never smoked but 2nd hand through her spouse  ? Hypertension Mother   ? Macular degeneration Mother   ? Heart disease Father   ? Cancer Father   ?     prostate  ? Hyperlipidemia Father   ? Hypertension Father   ? Varicose Veins Father   ? Heart attack Father   ? Peripheral vascular disease Father   ? Heart disease Sister   ? Arthritis Sister   ? Hyperlipidemia Sister   ? Obesity Sister   ? Macular degeneration Sister   ? Heart disease Brother   ?     5 stents  ? Hyperlipidemia Brother   ? Macular degeneration Maternal Grandfather   ? Cirrhosis Sister   ? Obesity Sister   ? Arthritis Sister   ? Heart disease Sister   ? Obesity Sister   ? Liver disease Other   ? Prostate cancer Other   ? Coronary artery disease Other   ? Colon cancer Neg Hx   ? Esophageal cancer Neg Hx   ? Rectal cancer Neg Hx   ? Stomach cancer Neg Hx   ? ? ? ?Atrial Fibrillation Management history: ? ?Previous antiarrhythmic drugs: propafenone  ?Previous cardioversions: 2019 ?Previous ablations: none ?CHADS2VASC score: 6 ?Anticoagulation history: Eliquis, Pradaxa  ? ? ?Past Medical History:  ?Diagnosis Date  ? Anemia   ? Anticoagulant long-term  use   ? eliquis--- mananged by cardiology  ? BPH with urinary obstruction   ? CAD (coronary artery disease)   ? cardiologist--- dr g. taylor--- cath 04-22-2011  moderate LM stenosis, borderline sig pRCA, sig stenosis mid to distal LAD ;  aggressive medical therapy;   cath 07-21-2021 for chest pain w/ ST changes,  severe disease;  s/p CABG x4 07-22-2021  ? Chronic diastolic CHF (congestive heart failure) ([place]) 03/2018  ? followed by cardiology  ? CKD (chronic kidney disease), stage III ([place])   ? COPD (chronic obstructive pulmonary disease) ([place])   ? followed by pcp  ? DOE (dyspnea on exertion)   ? per pt when walk >100yds,  per pt  rides outside bike 4-8 miles daily without sob,  household chores and yard work without sob  ? Eczema   ? GERD (gastroesophageal reflux disease)   ? Glaucoma, left eye   ? Heart murmur   ? Hemorrhoids   ? History of adenomatous polyp of colon   ? History of coma 1962  ? 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 History of kidney stones   ? History of rheumatic fever as a child   ? History of syncope 02/2014  ? in setting AFlutter w/ RVR of known PAF admission in epic  ? History of transient ischemic attack (TIA) 03/23/2021  ? 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 History of urinary retention   ? Hypertension   ? Lumbar radiculopathy   ? per pt with left hip/ leg pain  ? Lymphocytic colitis   ? followed by dr v. Bryan Lemma--- dx by biopsy 01-28-2021  ? Macular degeneration of both eyes   ? followed by dr c. Cordelia Pen--- bilateral eye injection every 6 wks  ? Malignant neoplasm prostate [facility]) 07/2021  ? urologist-- dr Diona Fanti;  dx 10/ 2022,  Gleason 5+5, PSA 1.8  ? Osteoporosis 05/31/2016  ? PAF (paroxysmal atrial fibrillation) ([place]) 03/2012  ? cardiologist-- dr g. taylor;  first dx 06/ 2013 AFlutter w/ RVR  ? Vitamin D deficiency   ? ?Past Surgical History:  ?Procedure Laterality Date  ? ANTERIOR CERVICAL DECOMP/DISCECTOMY FUSION  06/08/2002  ? @[facility] ;  C5--C7  ? APPENDECTOMY  1953  ? CARDIAC CATHETERIZATION  04/22/2011  ? moderate left main and RCA stenosis not significant by FFR and IVUS on medical therapy  ? CARDIOVERSION N/A 04/25/2018  ? Procedure: CARDIOVERSION;  Surgeon: Skeet Latch, MD;  Location: [facility];  Service: Cardiovascular;  Laterality: N/A;  ? CATARACT EXTRACTION W/ INTRAOCULAR LENS IMPLANT Bilateral 2021  ? COLONOSCOPY WITH ESOPHAGOGASTRODUODENOSCOPY (EGD)  01/28/2021  ? by ZOXWRU  ? CORONARY ARTERY BYPASS GRAFT N/A 07/22/2021  ? Procedure: CORONARY ARTERY  BYPASS GRAFTING (CABG) TIMES 4, ON PUMP, USING LEFT INTERNAL MAMMARY ARTERY AND ENDOSCOPICALLY HARVESTED RIGHT GREATER SAPHENOUS VEIN;  Surgeon: Melrose Nakayama, MD;  Location: [place];  Service: Open Heart Surgery;  Laterality: N/A;  ? ENDOVEIN HARVEST OF GREATER SAPHENOUS VEIN  07/22/2021  ? Procedure: ENDOVEIN HARVEST OF GREATER SAPHENOUS VEIN;  Surgeon: Melrose Nakayama, MD;  Location: [place];  Service: Open Heart Surgery;;  ? EXTRACORPOREAL SHOCK WAVE LITHOTRIPSY    ? x2  1990s  ? FOOT SURGERY Right 1970  ? calcification removed from top of foot  ? GOLD SEED IMPLANT N/A 11/28/2021  ? Procedure:  GOLD SEED IMPLANT;  Surgeon: Festus Aloe, MD;  Location: [facility];  Service: Urology;  Laterality: N/A;  ? KNEE ARTHROSCOPY Right 1991  ? LAPAROSCOPIC CHOLECYSTECTOMY  2000  ? LEFT HEART CATH AND CORONARY ANGIOGRAPHY N/A 07/21/2021  ? Procedure: LEFT HEART CATH AND CORONARY ANGIOGRAPHY;  Surgeon: Lorretta Harp, MD;  Location: [place] [facility];  Service: Cardiovascular;  Laterality: N/A;  ? SPACE OAR INSTILLATION N/A 11/28/2021  ? Procedure: SPACE OAR INSTILLATION;  Surgeon: Festus Aloe, MD;  Location[facility];  Service: Urology;  Laterality: N/A;  ? TEE WITHOUT CARDIOVERSION N/A 07/22/2021  ? Procedure: TRANSESOPHAGEAL ECHOCARDIOGRAM (TEE);  Surgeon: Melrose Nakayama, MD;  Location: [place];  Service: Open Heart Surgery;  Laterality: N/A;  ? TOTAL KNEE ARTHROPLASTY Right 08/14/2009  ? @[facility]   ? TRANSURETHRAL RESECTION OF PROSTATE N/A 07/17/2021  ? Procedure: TRANSURETHRAL RESECTION OF THE PROSTATE (TURP);  Surgeon: [name], MD;  Location: [facility];  Service: Urology;  Laterality: N/A;  1 HR  ? ? ?Current Outpatient Medications  ?Medication Sig Dispense Refill  ? apixaban (ELIQUIS) 5 MG TABS tablet Take 1 tablet (5 mg total) by mouth 2 (two) times daily. 180 tablet 3  ? aspirin EC 81 MG EC tablet Take 1 tablet (81 mg total) by mouth  daily. Swallow whole. 30 tablet 11  ? brimonidine (ALPHAGAN) 0.2 % ophthalmic solution Place 1 drop into the left eye in the morning and at bedtime.   7  ? budesonide (ENTOCORT EC) 3 MG 24 hr capsule Take 3

## 2022-01-28 ENCOUNTER — Ambulatory Visit
Admission: RE | Admit: 2022-01-28 | Discharge: 2022-01-28 | Disposition: A | Discharge status: Home / Self Care | Payer: Medicare Other | Source: Ambulatory Visit | Attending: Radiation Oncology | Admitting: Radiation Oncology

## 2022-01-28 ENCOUNTER — Other Ambulatory Visit: Payer: Self-pay

## 2022-01-28 DIAGNOSIS — D6869 Other thrombophilia: Secondary | ICD-10-CM | POA: Diagnosis not present

## 2022-01-28 DIAGNOSIS — Z510 Encounter for antineoplastic radiation therapy: Secondary | ICD-10-CM | POA: Diagnosis not present

## 2022-01-28 DIAGNOSIS — Z191 Hormone sensitive malignancy status: Secondary | ICD-10-CM | POA: Diagnosis not present

## 2022-01-28 DIAGNOSIS — C61 Malignant neoplasm of prostate: Secondary | ICD-10-CM | POA: Diagnosis not present

## 2022-01-28 DIAGNOSIS — I4819 Other persistent atrial fibrillation: Secondary | ICD-10-CM | POA: Diagnosis not present

## 2022-01-28 LAB — RAD ONC ARIA SESSION SUMMARY
Course Elapsed Days: 49
Plan Fractions Treated to Date: 3
Plan Prescribed Dose Per Fraction: 2 Gy
Plan Total Fractions Prescribed: 18
Plan Total Prescribed Dose: 36 Gy
Reference Point Dosage Given to Date: 45.6 Gy
Reference Point Session Dosage Given: 2 Gy
Session Number: 25

## 2022-01-28 NOTE — Progress Notes (Deleted)
Subjective:    Patient ID: Paul Half, male    DOB: 1946/02/12, 76 y.o.   MRN: [id number]  No chief complaint on file.   HPI Patient is in today for a follow up.  Past Medical History:  Diagnosis Date   Anemia    Anticoagulant long-term use    eliquis--- mananged by cardiology   BPH with urinary obstruction    CAD (coronary artery disease)    cardiologist--- dr g. taylor--- cath 04-22-2011  moderate LM stenosis, borderline sig pRCA, sig stenosis mid to distal LAD ;  aggressive medical therapy;   cath 07-21-2021 for chest pain w/ ST changes,  severe disease;  s/p CABG x4 07-22-2021   Chronic diastolic CHF (congestive heart failure) (HCC) 03/2018   followed by cardiology   CKD (chronic kidney disease), stage III (HCC)    COPD (chronic obstructive pulmonary disease) (HCC)    followed by pcp   DOE (dyspnea on exertion)    per pt when walk >100yds,  per pt rides outside bike 4-8 miles daily without sob,  household chores and yard work without sob   Eczema    GERD (gastroesophageal reflux disease)    Glaucoma, left eye    Heart murmur    Hemorrhoids    History of adenomatous polyp of colon    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 Barron Alvine--- dx by biopsy 01-28-2021   Macular degeneration of both eyes    followed by dr c. 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 taylor;  first dx 06/ 2013 AFlutter w/ RVR   Vitamin D deficiency     Past Surgical History:  Procedure Laterality Date   ANTERIOR CERVICAL DECOMP/DISCECTOMY FUSION  06/08/2002   @[facility] ;  C5--C7   APPENDECTOMY  1953   CARDIAC CATHETERIZATION  04/22/2011   moderate left main and RCA stenosis not significant by FFR and IVUS on medical therapy   CARDIOVERSION N/A 04/25/2018   Procedure: CARDIOVERSION;  Surgeon: Chilton Si, MD;  Location: [facility] ENDOSCOPY;  Service: Cardiovascular;  Laterality: N/A;   CATARACT EXTRACTION W/ INTRAOCULAR LENS IMPLANT Bilateral 2021   COLONOSCOPY WITH ESOPHAGOGASTRODUODENOSCOPY (EGD)  01/28/2021   by OZHYQM   CORONARY ARTERY BYPASS GRAFT N/A 07/22/2021   Procedure: 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, ON PUMP, USING LEFT INTERNAL MAMMARY ARTERY AND ENDOSCOPICALLY HARVESTED RIGHT GREATER SAPHENOUS VEIN;  Surgeon: Loreli Slot, MD;  Location: [facility] OR;  Service: Open Heart Surgery;  Laterality: N/A;   ENDOVEIN HARVEST OF GREATER SAPHENOUS VEIN  07/22/2021   Procedure: ENDOVEIN HARVEST OF GREATER  SAPHENOUS VEIN;  Surgeon: Loreli Slot, MD;  Location: [facility] OR;  Service: Open Heart Surgery;;   EXTRACORPOREAL SHOCK WAVE LITHOTRIPSY     x2  1990s   FOOT SURGERY Right 1970   calcification removed from top of foot   GOLD SEED IMPLANT N/A 11/28/2021   Procedure: GOLD SEED IMPLANT;  Surgeon: Jerilee Field, MD;  Location: [facility];  Service: Urology;  Laterality: N/A;   KNEE ARTHROSCOPY Right 1991   LAPAROSCOPIC CHOLECYSTECTOMY  2000   LEFT HEART CATH AND CORONARY ANGIOGRAPHY N/A 07/21/2021   Procedure: LEFT HEART CATH AND CORONARY ANGIOGRAPHY;  Surgeon: Runell Gess, MD;  Location: [facility] INVASIVE CV LAB;  Service: Cardiovascular;  Laterality: N/A;   SPACE OAR  INSTILLATION N/A 11/28/2021   Procedure: SPACE OAR INSTILLATION;  Surgeon: Jerilee Field, MD;  Location: [facility];  Service: Urology;  Laterality: N/A;   TEE WITHOUT CARDIOVERSION N/A 07/22/2021   Procedure: TRANSESOPHAGEAL ECHOCARDIOGRAM (TEE);  Surgeon: Loreli Slot, MD;  Location: [facility] OR;  Service: Open Heart Surgery;  Laterality: N/A;   TOTAL KNEE ARTHROPLASTY Right 08/14/2009   @[facility]    TRANSURETHRAL RESECTION OF PROSTATE N/A 07/17/2021   Procedure: TRANSURETHRAL RESECTION OF THE PROSTATE (TURP);  Surgeon: Marcine Matar, MD;  Location: [facility];  Service: Urology;  Laterality: N/A;  1 HR    Family History  Problem Relation Age of Onset   Heart disease Mother    Diabetes Mother    Cirrhosis Mother    Emphysema Mother        never smoked but 2nd hand through her spouse   Hypertension Mother    Macular degeneration Mother    Heart disease Father    Cancer Father        prostate   Hyperlipidemia Father    Hypertension Father    Varicose Veins Father    Heart attack Father    Peripheral vascular disease Father    Heart disease Sister    Arthritis Sister    Hyperlipidemia Sister    Obesity Sister    Macular degeneration Sister    Heart disease Brother        5 stents   Hyperlipidemia Brother    Macular degeneration Maternal Grandfather    Cirrhosis Sister    Obesity Sister    Arthritis Sister    Heart disease Sister    Obesity Sister    Liver disease Other    Prostate cancer Other    Coronary artery disease Other    Colon cancer Neg Hx    Esophageal cancer Neg Hx    Rectal cancer Neg Hx    Stomach cancer Neg Hx     Social History   Socioeconomic History   Marital status: Married    Spouse name: [name]   Number of children: 0   Years of education: Not on file   Highest education level: Not on file  Occupational History   Occupation: retired    Associate Professor: RETIRED  Tobacco Use   Smoking status: Former     Packs/day: 0.50    Years: 50.00    Pack years: 25.00    Types: Cigarettes    Quit date: 05/01/2009    Years since quitting: 12.7   Smokeless tobacco: Former    Types: Chew    Quit date: 1995   Tobacco comments:    Former smoker 01/27/22  Vaping Use   Vaping Use: Never used  Substance and Sexual Activity  Alcohol use: Yes    Alcohol/week: 2.0 standard drinks    Types: 2 Cans of beer per week    Comment: 1 beer daily 2 days a week 01/27/22   Drug use: Never   Sexual activity: Yes    Comment: lives with wife, no dietary restrictions  Other Topics Concern   Not on file  Social History Narrative   Lives with wife   Social Determinants of Health   Financial Resource Strain: Not on file  Food Insecurity: Not on file  Transportation Needs: Not on file  Physical Activity: Not on file  Stress: Not on file  Social Connections: Not on file  Intimate Partner Violence: Not on file    Outpatient Medications Prior to Visit  Medication Sig Dispense Refill   apixaban (ELIQUIS) 5 MG TABS tablet Take 1 tablet (5 mg total) by mouth 2 (two) times daily. 180 tablet 3   aspirin EC 81 MG EC tablet Take 1 tablet (81 mg total) by mouth daily. Swallow whole. 30 tablet 11   brimonidine (ALPHAGAN) 0.2 % ophthalmic solution Place 1 drop into the left eye in the morning and at bedtime.   7   budesonide (ENTOCORT EC) 3 MG 24 hr capsule Take 3 capsules (9 mg total) by mouth daily. Take 3 capsules (9 mg total) by mouth once daily 270 capsule 0   cholecalciferol (VITAMIN D3) 25 MCG (1000 UNIT) tablet Take 10,000 mcg by mouth 4 (four) times a week.     clobetasol (TEMOVATE) 0.05 % external solution APPLY 1 APPLICATION TOPICALLY TWICE DAILY 100 mL 3   Evolocumab (REPATHA SURECLICK) 140 MG/ML SOAJ Inject 140 mg into the skin every 14 (fourteen) days. 6 mL 3   Ferrous Fumarate (HEMOCYTE) 324 (106 Fe) MG TABS tablet Take 1 tablet (106 mg of iron total) by mouth daily. 30 tablet 3   furosemide (LASIX) 20 MG  tablet TAKE 1 TABLET BY MOUTH DAILY AS  NEEDED 90 tablet 3   latanoprost (XALATAN) 0.005 % ophthalmic solution Place 1 drop into the left eye at bedtime.      levalbuterol (XOPENEX HFA) 45 MCG/ACT inhaler INHALE 2 PUFFS BY MOUTH INTO THE LUNGS EVERY 4 HOURS AS NEEDED FOR WHEEZING 45 g 2   loperamide (IMODIUM) 2 MG capsule TAKE 1 CAPSULE(2 MG) BY MOUTH EVERY 6 HOURS AS NEEDED FOR DIARRHEA OR LOOSE STOOLS 60 capsule 3   MAGnesium-Oxide 400 (240 Mg) MG tablet Take 1 tablet (400 mg total) by mouth 3 (three) times daily. (Patient taking differently: Take 400 mg by mouth 2 (two) times daily.) 270 tablet 1   metoprolol tartrate 75 MG TABS Take 75 mg by mouth 2 (two) times daily. 60 tablet 3   Multiple Vitamins-Minerals (PRESERVISION AREDS 2) CAPS Take 1 tablet by mouth daily.     nitroGLYCERIN (NITROSTAT) 0.4 MG SL tablet Place 1 tablet (0.4 mg total) under the tongue every 5 (five) minutes as needed for chest pain. 30 tablet 0   omeprazole (PRILOSEC OTC) 20 MG tablet Take 20 mg by mouth daily.     Ranibizumab (LUCENTIS IO) Inject 1 Dose into the eye every 6 (six) weeks. Bilateral eye by Dr Marvis Repress     tamsulosin (FLOMAX) 0.4 MG CAPS capsule Take 0.4 mg by mouth 2 (two) times daily.     No facility-administered medications prior to visit.    Allergies  Allergen Reactions   Albumin (Human) Anaphylaxis   Polymyxin B-Trimethoprim Swelling    Eye drops made eyes  swell   Pseudoephedrine Other (See Comments)    Stomach cramps   Codeine Hives, Itching and Rash   Guaiacol Other (See Comments)    Hallucinations   Statins Other (See Comments)    Muscle cramps    Gabapentin Other (See Comments)    Pt unsure of sensitivity   Meloxicam Other (See Comments)    Pt unsure of sensitivity   Peppermint Flavor Other (See Comments)    Severe cramping   Pseudoephedrine-Guaifenesin Nausea And Vomiting    Stomach cramps   Rosuvastatin Calcium Other (See Comments)    Muscle aches   Tapentadol Other (See  Comments)    Pt unsure of sensitivity   Ciprofloxacin Hives, Itching, Nausea Only and Rash   Moxifloxacin Nausea Only and Other (See Comments)    Headaches, stomach cramps    Rofecoxib Other (See Comments)    Stomach cramping    ROS     Objective:    Physical Exam  There were no vitals taken for this visit. Wt Readings from Last 3 Encounters:  01/27/22 155 lb 12.8 oz (70.7 kg)  01/23/22 150 lb 9.6 oz (68.3 kg)  01/15/22 168 lb 14 oz (76.6 kg)    Diabetic Foot Exam - Simple   No data filed    Lab Results  Component Value Date   WBC 8.0 01/21/2022   HGB 10.0 (L) 01/21/2022   HCT 30.4 (L) 01/21/2022   PLT 304 01/21/2022   GLUCOSE 105 (H) 01/21/2022   CHOL 121 10/13/2021   TRIG 70 10/13/2021   HDL 56 10/13/2021   LDLDIRECT 134.2 12/01/2013   LDLCALC 51 10/13/2021   ALT 20 01/16/2022   AST 25 01/16/2022   NA 136 01/21/2022   K 4.6 01/21/2022   CL 100 01/21/2022   CREATININE 1.01 01/21/2022   BUN 30 (H) 01/21/2022   CO2 27 01/21/2022   TSH 2.040 01/19/2022   PSA 1.80 03/04/2021   INR 1.4 (H) 07/22/2021   HGBA1C 5.0 07/21/2021    Lab Results  Component Value Date   TSH 2.040 01/19/2022   Lab Results  Component Value Date   WBC 8.0 01/21/2022   HGB 10.0 (L) 01/21/2022   HCT 30.4 (L) 01/21/2022   MCV 88.9 01/21/2022   PLT 304 01/21/2022   Lab Results  Component Value Date   NA 136 01/21/2022   K 4.6 01/21/2022   CO2 27 01/21/2022   GLUCOSE 105 (H) 01/21/2022   BUN 30 (H) 01/21/2022   CREATININE 1.01 01/21/2022   BILITOT 0.6 01/16/2022   ALKPHOS 50 01/16/2022   AST 25 01/16/2022   ALT 20 01/16/2022   PROT 6.3 (L) 01/16/2022   ALBUMIN 2.8 (L) 01/16/2022   CALCIUM 9.4 01/21/2022   ANIONGAP 9 01/21/2022   EGFR 57 (L) 08/12/2021   GFR 45.89 (L) 09/23/2021   Lab Results  Component Value Date   CHOL 121 10/13/2021   Lab Results  Component Value Date   HDL 56 10/13/2021   Lab Results  Component Value Date   LDLCALC 51 10/13/2021   Lab  Results  Component Value Date   TRIG 70 10/13/2021   Lab Results  Component Value Date   CHOLHDL 2.2 10/13/2021   Lab Results  Component Value Date   HGBA1C 5.0 07/21/2021       Assessment & Plan:   Problem List Items Addressed This Visit   None   I am having Mousa L. Neva Seat "Shon Hale" maintain his omeprazole, Ranibizumab (LUCENTIS IO), latanoprost,  clobetasol, brimonidine, nitroGLYCERIN, tamsulosin, aspirin, levalbuterol, Repatha SureClick, magnesium oxide, Ferrous Fumarate, PreserVision AREDS 2, apixaban, furosemide, loperamide, budesonide, Metoprolol Tartrate, and cholecalciferol.  No orders of the defined types were placed in this encounter.

## 2022-01-29 ENCOUNTER — Encounter: Payer: Self-pay | Admitting: Family Medicine

## 2022-01-29 ENCOUNTER — Ambulatory Visit (INDEPENDENT_AMBULATORY_CARE_PROVIDER_SITE_OTHER): Payer: Medicare Other | Admitting: Family Medicine

## 2022-01-29 ENCOUNTER — Other Ambulatory Visit (HOSPITAL_BASED_OUTPATIENT_CLINIC_OR_DEPARTMENT_OTHER): Payer: Self-pay

## 2022-01-29 ENCOUNTER — Other Ambulatory Visit: Payer: Self-pay

## 2022-01-29 ENCOUNTER — Ambulatory Visit
Admission: RE | Admit: 2022-01-29 | Discharge: 2022-01-29 | Disposition: A | Discharge status: Home / Self Care | Payer: Medicare Other | Source: Ambulatory Visit | Attending: Radiation Oncology | Admitting: Radiation Oncology

## 2022-01-29 ENCOUNTER — Ambulatory Visit: Payer: Medicare Other

## 2022-01-29 VITALS — BP 122/78 | HR 20 | Resp 20 | Ht 64.0 in | Wt 158.6 lb

## 2022-01-29 DIAGNOSIS — C61 Malignant neoplasm of prostate: Secondary | ICD-10-CM

## 2022-01-29 DIAGNOSIS — I5033 Acute on chronic diastolic (congestive) heart failure: Secondary | ICD-10-CM | POA: Diagnosis not present

## 2022-01-29 DIAGNOSIS — I2510 Atherosclerotic heart disease of native coronary artery without angina pectoris: Secondary | ICD-10-CM

## 2022-01-29 DIAGNOSIS — I10 Essential (primary) hypertension: Secondary | ICD-10-CM | POA: Diagnosis not present

## 2022-01-29 DIAGNOSIS — E559 Vitamin D deficiency, unspecified: Secondary | ICD-10-CM | POA: Diagnosis not present

## 2022-01-29 DIAGNOSIS — Z510 Encounter for antineoplastic radiation therapy: Secondary | ICD-10-CM | POA: Diagnosis not present

## 2022-01-29 DIAGNOSIS — Z191 Hormone sensitive malignancy status: Secondary | ICD-10-CM | POA: Diagnosis not present

## 2022-01-29 DIAGNOSIS — D649 Anemia, unspecified: Secondary | ICD-10-CM

## 2022-01-29 DIAGNOSIS — D6869 Other thrombophilia: Secondary | ICD-10-CM | POA: Diagnosis not present

## 2022-01-29 DIAGNOSIS — I4819 Other persistent atrial fibrillation: Secondary | ICD-10-CM | POA: Diagnosis not present

## 2022-01-29 LAB — RAD ONC ARIA SESSION SUMMARY
Course Elapsed Days: 50
Plan Fractions Treated to Date: 4
Plan Prescribed Dose Per Fraction: 2 Gy
Plan Total Fractions Prescribed: 18
Plan Total Prescribed Dose: 36 Gy
Reference Point Dosage Given to Date: 47.6 Gy
Reference Point Session Dosage Given: 2 Gy
Session Number: 26

## 2022-01-29 MED ORDER — DIAZEPAM 5 MG PO TABS: 2.5000 mg | ORAL_TABLET | Freq: Two times a day (BID) | ORAL | 1 refills | Status: DC | PRN

## 2022-01-29 NOTE — Progress Notes (Signed)
? ?Subjective:  ? ?By signing my name below, I, Ryan [name], attest that this documentation has been prepared under the direction and in the presence of [name], MD. 01/29/2022 ? ? Patient ID: Ryan [name], male    DOB: March 17, 1946, 76 y.o.   MRN: [id number] ? ?Chief Complaint  ?Patient presents with  ? Follow-up  ? ? ?HPI ?Patient is in today for an office visit and f/u ? ?He was told at the hospital to obtain a home oxygen generator. He has been having a hard time breathing due to the treatments for his cancer. He was in the hospital from 04/15/-4/21 after complaining of shortness of breath. He has 14 more radiation treatments.  ? ?He is also requesting for medication to help him sleep. He increased melatonin to 20 mg and it is not helping him. He is very stressed from all the treatments and needs to rest at night. He thinks he is a little depressed.  ? ?He stopped the iron infusions because he was not tolerating them.  ? ?Past Medical History:  ?Diagnosis Date  ? Anemia   ? Anticoagulant long-term use   ? eliquis--- mananged by cardiology  ? BPH with urinary obstruction   ? CAD (coronary artery disease)   ? cardiologist--- dr g. taylor--- cath 04-22-2011  moderate LM stenosis, borderline sig pRCA, sig stenosis mid to distal LAD ;  aggressive medical therapy;   cath 07-21-2021 for chest pain w/ ST changes,  severe disease;  s/p CABG x4 07-22-2021  ? Chronic diastolic CHF (congestive heart failure) ([place]) 03/2018  ? followed by cardiology  ? CKD (chronic kidney disease), stage III ([place])   ? COPD (chronic obstructive pulmonary disease) ([place])   ? followed by pcp  ? DOE (dyspnea on exertion)   ? per pt when walk >100yds,  per pt rides outside bike 4-8 miles daily without sob,  household chores and yard work without sob  ? Eczema   ? GERD (gastroesophageal reflux disease)   ? Glaucoma, left eye   ? Heart murmur   ? Hemorrhoids   ? History of adenomatous polyp of colon   ? History of coma 1962  ? 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 History of kidney stones   ? History of rheumatic fever as a child   ? History of syncope 02/2014  ? in setting AFlutter w/ RVR of known PAF admission in epic  ? History of transient ischemic attack (TIA) 03/23/2021  ? 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 History of urinary retention   ? Hypertension   ? Lumbar radiculopathy   ? per pt with left hip/ leg pain  ? Lymphocytic colitis   ? followed by dr v. Bryan Lemma--- dx by biopsy 01-28-2021  ? Macular degeneration of both eyes   ? followed by dr c. Cordelia Pen--- bilateral eye injection every 6 wks  ? 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 urologist-- dr Diona Fanti;  dx 10/ 2022,  Gleason 5+5, PSA 1.8  ? Osteoporosis 05/31/2016  ? PAF (paroxysmal atrial fibrillation) ([place]) 03/2012  ? cardiologist-- dr g. taylor;  first dx 06/ 2013 AFlutter w/ RVR  ? Vitamin D deficiency   ? ? ?Past Surgical History:  ?Procedure Laterality Date  ? ANTERIOR CERVICAL DECOMP/DISCECTOMY FUSION  06/08/2002  ? @[facility] ;  C5--C7  ? APPENDECTOMY  1953  ? CARDIAC CATHETERIZATION  04/22/2011  ? moderate left main and RCA stenosis not significant by FFR and IVUS on medical therapy  ? CARDIOVERSION N/A 04/25/2018  ? Procedure: CARDIOVERSION;  Surgeon: Skeet Latch, MD;  Location: [place];  Service: Cardiovascular;  Laterality: N/A;  ? CATARACT EXTRACTION W/ INTRAOCULAR LENS IMPLANT Bilateral 2021  ? COLONOSCOPY WITH ESOPHAGOGASTRODUODENOSCOPY (EGD)  01/28/2021  ? by JJOACZ  ? CORONARY ARTERY BYPASS GRAFT N/A 07/22/2021  ? Procedure: 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, ON PUMP, USING LEFT INTERNAL MAMMARY ARTERY AND ENDOSCOPICALLY HARVESTED RIGHT GREATER SAPHENOUS VEIN;  Surgeon: Melrose Nakayama, MD;  Location: [place];  Service: Open Heart Surgery;  Laterality: N/A;  ? ENDOVEIN HARVEST OF GREATER SAPHENOUS VEIN   07/22/2021  ? Procedure: ENDOVEIN HARVEST OF GREATER SAPHENOUS VEIN;  Surgeon: Melrose Nakayama, MD;  Location: [place];  Service: Open Heart Surgery;;  ? EXTRACORPOREAL SHOCK WAVE LITHOTRIPSY    ? x2  1990s  ? FOOT SURGERY Right 1970  ? calcification removed from top of foot  ? GOLD SEED IMPLANT N/A 11/28/2021  ? Procedure: GOLD SEED IMPLANT;  Surgeon: Festus Aloe, MD;  Location: [facility];  Service: Urology;  Laterality: N/A;  ? KNEE ARTHROSCOPY Right 1991  ? LAPAROSCOPIC CHOLECYSTECTOMY  2000  ? LEFT HEART CATH AND CORONARY ANGIOGRAPHY N/A 07/21/2021  ? Procedure: LEFT HEART CATH AND CORONARY ANGIOGRAPHY;  Surgeon: Lorretta Harp, MD;  Location: [place] CV LAB;  Service: Cardiovascular;  Laterality: N/A;  ? SPACE OAR INSTILLATION N/A 11/28/2021  ? Procedure: SPACE OAR INSTILLATION;  Surgeon: Festus Aloe, MD;  Location: [facility];  Service: Urology;  Laterality: N/A;  ? TEE WITHOUT CARDIOVERSION N/A 07/22/2021  ? Procedure: TRANSESOPHAGEAL ECHOCARDIOGRAM (TEE);  Surgeon: Melrose Nakayama, MD;  Location: [place];  Service: Open Heart Surgery;  Laterality: N/A;  ? TOTAL KNEE ARTHROPLASTY Right 08/14/2009  ? @[facility]   ? TRANSURETHRAL RESECTION OF PROSTATE N/A 07/17/2021  ? Procedure: TRANSURETHRAL RESECTION OF THE PROSTATE (TURP);  Surgeon: [name], MD;  Location: [facility];  Service: Urology;  Laterality: N/A;  1 HR  ? ? ?Family History  ?Problem Relation Age of Onset  ? Heart disease Mother   ? Diabetes Mother   ? Cirrhosis Mother   ? Emphysema Mother   ?     never smoked but 2nd hand through her spouse  ? Hypertension Mother   ? Macular degeneration Mother   ? Heart disease Father   ? Cancer Father   ?     prostate  ? Hyperlipidemia Father   ? Hypertension Father   ? Varicose Veins Father   ? Heart attack Father   ? Peripheral vascular disease Father   ? Heart disease Sister   ? Arthritis Sister   ? Hyperlipidemia Sister   ? Obesity  Sister   ? Macular degeneration Sister   ? Heart disease Brother   ?     5 stents  ? Hyperlipidemia Brother   ? Macular degeneration Maternal Grandfather   ? Cirrhosis Sister   ? Obesity Sister   ? Arthritis Sister   ? Heart disease Sister   ? Obesity Sister   ? Liver disease Other   ? Prostate cancer Other   ? Coronary artery disease Other   ? Colon cancer Neg Hx   ? Esophageal cancer Neg Hx   ? Rectal cancer Neg Hx   ?  Stomach cancer Neg Hx   ? ? ?Social History  ? ?Socioeconomic History  ? Marital status: Married  ?  Spouse name: Thayer Headings  ? Number of children: 0  ? Years of education: Not on file  ? Highest education level: Not on file  ?Occupational History  ? Occupation: retired  ?  Employer: RETIRED  ?Tobacco Use  ? Smoking status: Former  ?  Packs/day: 0.50  ?  Years: 50.00  ?  Pack years: 25.00  ?  Types: Cigarettes  ?  Quit date: 05/01/2009  ?  Years since quitting: 12.7  ? Smokeless tobacco: Former  ?  Types: Chew  ?  Quit date: 1995  ? Tobacco comments:  ?  Former smoker 01/27/22  ?Vaping Use  ? Vaping Use: Never used  ?Substance and Sexual Activity  ? Alcohol use: Yes  ?  Alcohol/week: 2.0 standard drinks  ?  Types: 2 Cans of beer per week  ?  Comment: 1 beer daily 2 days a week 01/27/22  ? Drug use: Never  ? Sexual activity: Yes  ?  Comment: lives with wife, no dietary restrictions  ?Other Topics Concern  ? Not on file  ?Social History Narrative  ? Lives with wife  ? ?Social Determinants of Health  ? ?Financial Resource Strain: Not on file  ?Food Insecurity: Not on file  ?Transportation Needs: Not on file  ?Physical Activity: Not on file  ?Stress: Not on file  ?Social Connections: Not on file  ?Intimate Partner Violence: Not on file  ? ? ?Outpatient Medications Prior to Visit  ?Medication Sig Dispense Refill  ? apixaban (ELIQUIS) 5 MG TABS tablet Take 1 tablet (5 mg total) by mouth 2 (two) times daily. 180 tablet 3  ? aspirin EC 81 MG EC tablet Take 1 tablet (81 mg total) by mouth daily. Swallow whole. 30  tablet 11  ? brimonidine (ALPHAGAN) 0.2 % ophthalmic solution Place 1 drop into the left eye in the morning and at bedtime.   7  ? budesonide (ENTOCORT EC) 3 MG 24 hr capsule Take 3 capsules (9 mg total) b

## 2022-01-29 NOTE — Patient Instructions (Signed)
Anemia ? ?Anemia is a condition in which there is not enough red blood cells or hemoglobin in the blood. Hemoglobin is a substance in red blood cells that carries oxygen. ?When you do not have enough red blood cells or hemoglobin (are anemic), your body cannot get enough oxygen and your organs may not work properly. As a result, you may feel very tired or have other problems. ?What are the causes? ?Common causes of anemia include: ?Excessive bleeding. Anemia can be caused by excessive bleeding inside or outside the body, including bleeding from the intestines or from heavy menstrual periods in females. ?Poor nutrition. ?Long-lasting (chronic) kidney, thyroid, and liver disease. ?Bone marrow disorders, spleen problems, and blood disorders. ?Cancer and treatments for cancer. ?HIV (human immunodeficiency virus) and AIDS (acquired immunodeficiency syndrome). ?Infections, medicines, and autoimmune disorders that destroy red blood cells. ?What are the signs or symptoms? ?Symptoms of this condition include: ?Minor weakness. ?Dizziness. ?Headache, or difficulties concentrating and sleeping. ?Heartbeats that feel irregular or faster than normal (palpitations). ?Shortness of breath, especially with exercise. ?Pale skin, lips, and nails, or cold hands and feet. ?Indigestion and nausea. ?Symptoms may occur suddenly or develop slowly. If your anemia is mild, you may not have symptoms. ?How is this diagnosed? ?This condition is diagnosed based on blood tests, your medical history, and a physical exam. In some cases, a test may be needed in which cells are removed from the soft tissue inside of a bone and looked at under a microscope (bone marrow biopsy). Your health care provider may also check your stool (feces) for blood and may do additional testing to look for the cause of your bleeding. ?Other tests may include: ?Imaging tests, such as a CT scan or MRI. ?A procedure to see inside your esophagus and stomach (endoscopy). ?A  procedure to see inside your colon and rectum (colonoscopy). ?How is this treated? ?Treatment for this condition depends on the cause. If you continue to lose a lot of blood, you may need to be treated at a hospital. Treatment may include: ?Taking supplements of iron, vitamin Z30, or folic acid. ?Taking a hormone medicine (erythropoietin) that can help to stimulate red blood cell growth. ?Having a blood transfusion. This may be needed if you lose a lot of blood. ?Making changes to your diet. ?Having surgery to remove your spleen. ?Follow these instructions at home: ?Take over-the-counter and prescription medicines only as told by your health care provider. ?Take supplements only as told by your health care provider. ?Follow any diet instructions that you were given by your health care provider. ?Keep all follow-up visits as told by your health care provider. This is important. ?Contact a health care provider if: ?You develop new bleeding anywhere in the body. ?Get help right away if: ?You are very weak. ?You are short of breath. ?You have pain in your abdomen or chest. ?You are dizzy or feel faint. ?You have trouble concentrating. ?You have bloody stools, black stools, or tarry stools. ?You vomit repeatedly or you vomit up blood. ?These symptoms may represent a serious problem that is an emergency. Do not wait to see if the symptoms will go away. Get medical help right away. Call your local emergency services (911 in the [place] Do not drive yourself to the hospital. ?Summary ?Anemia is a condition in which you do not have enough red blood cells or enough of a substance in your red blood cells that carries oxygen (hemoglobin). ?Symptoms may occur suddenly or develop slowly. ?If your anemia  is mild, you may not have symptoms. ?This condition is diagnosed with blood tests, a medical history, and a physical exam. Other tests may be needed. ?Treatment for this condition depends on the cause of the anemia. ?This  information is not intended to replace advice given to you by your health care provider. Make sure you discuss any questions you have with your health care provider. ?Document Revised: 08/05/2021 Document Reviewed: 08/29/2019 ?Elsevier Patient Education ? [place]. ? ?

## 2022-01-30 ENCOUNTER — Other Ambulatory Visit: Payer: Self-pay

## 2022-01-30 ENCOUNTER — Ambulatory Visit
Admission: RE | Admit: 2022-01-30 | Discharge: 2022-01-30 | Disposition: A | Discharge status: Home / Self Care | Payer: Medicare Other | Source: Ambulatory Visit | Attending: Radiation Oncology | Admitting: Radiation Oncology

## 2022-01-30 DIAGNOSIS — Z510 Encounter for antineoplastic radiation therapy: Secondary | ICD-10-CM | POA: Diagnosis not present

## 2022-01-30 DIAGNOSIS — D6869 Other thrombophilia: Secondary | ICD-10-CM | POA: Diagnosis not present

## 2022-01-30 DIAGNOSIS — I4819 Other persistent atrial fibrillation: Secondary | ICD-10-CM | POA: Diagnosis not present

## 2022-01-30 DIAGNOSIS — Z191 Hormone sensitive malignancy status: Secondary | ICD-10-CM | POA: Diagnosis not present

## 2022-01-30 DIAGNOSIS — C61 Malignant neoplasm of prostate: Secondary | ICD-10-CM | POA: Diagnosis not present

## 2022-01-30 LAB — RAD ONC ARIA SESSION SUMMARY
Course Elapsed Days: 51
Plan Fractions Treated to Date: 5
Plan Prescribed Dose Per Fraction: 2 Gy
Plan Total Fractions Prescribed: 18
Plan Total Prescribed Dose: 36 Gy
Reference Point Dosage Given to Date: 49.6 Gy
Reference Point Session Dosage Given: 2 Gy
Session Number: 27

## 2022-02-01 NOTE — Assessment & Plan Note (Signed)
Well controlled, no changes to meds. Encouraged heart healthy diet such as the DASH diet and exercise as tolerated.  °

## 2022-02-01 NOTE — Assessment & Plan Note (Signed)
He is tearful and stressed about his declining health. He will continue his care with oncology and urology ?

## 2022-02-01 NOTE — Assessment & Plan Note (Signed)
Supplement and monitor 

## 2022-02-01 NOTE — Assessment & Plan Note (Signed)
Is now following with cardiology and is doing well today ?

## 2022-02-02 ENCOUNTER — Ambulatory Visit
Admission: RE | Admit: 2022-02-02 | Discharge: 2022-02-02 | Disposition: A | Discharge status: Home / Self Care | Payer: Medicare Other | Source: Ambulatory Visit | Attending: Radiation Oncology | Admitting: Radiation Oncology

## 2022-02-02 ENCOUNTER — Other Ambulatory Visit: Payer: Self-pay

## 2022-02-02 DIAGNOSIS — Z191 Hormone sensitive malignancy status: Secondary | ICD-10-CM | POA: Diagnosis not present

## 2022-02-02 DIAGNOSIS — D6869 Other thrombophilia: Secondary | ICD-10-CM | POA: Diagnosis not present

## 2022-02-02 DIAGNOSIS — Z510 Encounter for antineoplastic radiation therapy: Secondary | ICD-10-CM | POA: Diagnosis not present

## 2022-02-02 DIAGNOSIS — I4819 Other persistent atrial fibrillation: Secondary | ICD-10-CM | POA: Diagnosis not present

## 2022-02-02 DIAGNOSIS — C61 Malignant neoplasm of prostate: Secondary | ICD-10-CM | POA: Diagnosis not present

## 2022-02-02 LAB — RAD ONC ARIA SESSION SUMMARY
Course Elapsed Days: 54
Plan Fractions Treated to Date: 6
Plan Prescribed Dose Per Fraction: 2 Gy
Plan Total Fractions Prescribed: 18
Plan Total Prescribed Dose: 36 Gy
Reference Point Dosage Given to Date: 51.6 Gy
Reference Point Session Dosage Given: 2 Gy
Session Number: 28

## 2022-02-03 ENCOUNTER — Other Ambulatory Visit: Payer: Self-pay

## 2022-02-03 ENCOUNTER — Ambulatory Visit: Payer: Medicare Other

## 2022-02-03 ENCOUNTER — Ambulatory Visit
Admission: RE | Admit: 2022-02-03 | Discharge: 2022-02-03 | Disposition: A | Discharge status: Home / Self Care | Payer: Medicare Other | Source: Ambulatory Visit | Attending: Radiation Oncology | Admitting: Radiation Oncology

## 2022-02-03 DIAGNOSIS — Z510 Encounter for antineoplastic radiation therapy: Secondary | ICD-10-CM | POA: Diagnosis not present

## 2022-02-03 DIAGNOSIS — Z191 Hormone sensitive malignancy status: Secondary | ICD-10-CM | POA: Diagnosis not present

## 2022-02-03 DIAGNOSIS — D6869 Other thrombophilia: Secondary | ICD-10-CM | POA: Diagnosis not present

## 2022-02-03 DIAGNOSIS — C61 Malignant neoplasm of prostate: Secondary | ICD-10-CM | POA: Diagnosis not present

## 2022-02-03 DIAGNOSIS — I4819 Other persistent atrial fibrillation: Secondary | ICD-10-CM | POA: Diagnosis not present

## 2022-02-03 LAB — RAD ONC ARIA SESSION SUMMARY
Course Elapsed Days: 55
Plan Fractions Treated to Date: 7
Plan Prescribed Dose Per Fraction: 2 Gy
Plan Total Fractions Prescribed: 18
Plan Total Prescribed Dose: 36 Gy
Reference Point Dosage Given to Date: 53.6 Gy
Reference Point Session Dosage Given: 2 Gy
Session Number: 29

## 2022-02-04 ENCOUNTER — Ambulatory Visit: Payer: Medicare Other

## 2022-02-04 ENCOUNTER — Ambulatory Visit
Admission: RE | Admit: 2022-02-04 | Discharge: 2022-02-04 | Disposition: A | Discharge status: Home / Self Care | Payer: Medicare Other | Source: Ambulatory Visit | Attending: Radiation Oncology | Admitting: Radiation Oncology

## 2022-02-04 ENCOUNTER — Other Ambulatory Visit: Payer: Self-pay

## 2022-02-04 DIAGNOSIS — D6869 Other thrombophilia: Secondary | ICD-10-CM | POA: Diagnosis not present

## 2022-02-04 DIAGNOSIS — C61 Malignant neoplasm of prostate: Secondary | ICD-10-CM | POA: Diagnosis not present

## 2022-02-04 DIAGNOSIS — Z191 Hormone sensitive malignancy status: Secondary | ICD-10-CM | POA: Diagnosis not present

## 2022-02-04 DIAGNOSIS — Z510 Encounter for antineoplastic radiation therapy: Secondary | ICD-10-CM | POA: Diagnosis not present

## 2022-02-04 DIAGNOSIS — I4819 Other persistent atrial fibrillation: Secondary | ICD-10-CM | POA: Diagnosis not present

## 2022-02-04 LAB — RAD ONC ARIA SESSION SUMMARY
Course Elapsed Days: 56
Plan Fractions Treated to Date: 8
Plan Prescribed Dose Per Fraction: 2 Gy
Plan Total Fractions Prescribed: 18
Plan Total Prescribed Dose: 36 Gy
Reference Point Dosage Given to Date: 55.6 Gy
Reference Point Session Dosage Given: 2 Gy
Session Number: 30

## 2022-02-05 ENCOUNTER — Ambulatory Visit: Payer: Medicare Other | Admitting: Nurse Practitioner

## 2022-02-05 ENCOUNTER — Ambulatory Visit: Payer: Medicare Other

## 2022-02-05 ENCOUNTER — Ambulatory Visit
Admission: RE | Admit: 2022-02-05 | Discharge: 2022-02-05 | Disposition: A | Discharge status: Home / Self Care | Payer: Medicare Other | Source: Ambulatory Visit | Attending: Radiation Oncology | Admitting: Radiation Oncology

## 2022-02-05 ENCOUNTER — Other Ambulatory Visit: Payer: Self-pay

## 2022-02-05 DIAGNOSIS — C61 Malignant neoplasm of prostate: Secondary | ICD-10-CM | POA: Diagnosis not present

## 2022-02-05 DIAGNOSIS — D6869 Other thrombophilia: Secondary | ICD-10-CM | POA: Diagnosis not present

## 2022-02-05 DIAGNOSIS — I4819 Other persistent atrial fibrillation: Secondary | ICD-10-CM | POA: Diagnosis not present

## 2022-02-05 DIAGNOSIS — Z191 Hormone sensitive malignancy status: Secondary | ICD-10-CM | POA: Diagnosis not present

## 2022-02-05 DIAGNOSIS — Z510 Encounter for antineoplastic radiation therapy: Secondary | ICD-10-CM | POA: Diagnosis not present

## 2022-02-05 LAB — RAD ONC ARIA SESSION SUMMARY
Course Elapsed Days: 57
Plan Fractions Treated to Date: 9
Plan Prescribed Dose Per Fraction: 2 Gy
Plan Total Fractions Prescribed: 18
Plan Total Prescribed Dose: 36 Gy
Reference Point Dosage Given to Date: 57.6 Gy
Reference Point Session Dosage Given: 2 Gy
Session Number: 31

## 2022-02-06 ENCOUNTER — Ambulatory Visit: Payer: Medicare Other

## 2022-02-06 ENCOUNTER — Other Ambulatory Visit: Payer: Self-pay

## 2022-02-06 ENCOUNTER — Ambulatory Visit
Admission: RE | Admit: 2022-02-06 | Discharge: 2022-02-06 | Disposition: A | Discharge status: Home / Self Care | Payer: Medicare Other | Source: Ambulatory Visit | Attending: Radiation Oncology | Admitting: Radiation Oncology

## 2022-02-06 DIAGNOSIS — D6869 Other thrombophilia: Secondary | ICD-10-CM | POA: Diagnosis not present

## 2022-02-06 DIAGNOSIS — Z191 Hormone sensitive malignancy status: Secondary | ICD-10-CM | POA: Diagnosis not present

## 2022-02-06 DIAGNOSIS — I4819 Other persistent atrial fibrillation: Secondary | ICD-10-CM | POA: Diagnosis not present

## 2022-02-06 DIAGNOSIS — Z510 Encounter for antineoplastic radiation therapy: Secondary | ICD-10-CM | POA: Diagnosis not present

## 2022-02-06 DIAGNOSIS — C61 Malignant neoplasm of prostate: Secondary | ICD-10-CM | POA: Diagnosis not present

## 2022-02-06 LAB — RAD ONC ARIA SESSION SUMMARY
Course Elapsed Days: 58
Plan Fractions Treated to Date: 10
Plan Prescribed Dose Per Fraction: 2 Gy
Plan Total Fractions Prescribed: 18
Plan Total Prescribed Dose: 36 Gy
Reference Point Dosage Given to Date: 59.6 Gy
Reference Point Session Dosage Given: 2 Gy
Session Number: 32

## 2022-02-09 ENCOUNTER — Ambulatory Visit (INDEPENDENT_AMBULATORY_CARE_PROVIDER_SITE_OTHER): Payer: Medicare Other | Admitting: Gastroenterology

## 2022-02-09 ENCOUNTER — Encounter: Payer: Self-pay | Admitting: Gastroenterology

## 2022-02-09 ENCOUNTER — Ambulatory Visit
Admission: RE | Admit: 2022-02-09 | Discharge: 2022-02-09 | Disposition: A | Discharge status: Home / Self Care | Payer: Medicare Other | Source: Ambulatory Visit | Attending: Radiation Oncology | Admitting: Radiation Oncology

## 2022-02-09 ENCOUNTER — Other Ambulatory Visit: Payer: Self-pay

## 2022-02-09 VITALS — BP 122/78 | HR 95 | Wt 161.0 lb

## 2022-02-09 DIAGNOSIS — C61 Malignant neoplasm of prostate: Secondary | ICD-10-CM | POA: Diagnosis not present

## 2022-02-09 DIAGNOSIS — Z510 Encounter for antineoplastic radiation therapy: Secondary | ICD-10-CM | POA: Diagnosis not present

## 2022-02-09 DIAGNOSIS — K52832 Lymphocytic colitis: Secondary | ICD-10-CM

## 2022-02-09 DIAGNOSIS — K219 Gastro-esophageal reflux disease without esophagitis: Secondary | ICD-10-CM | POA: Diagnosis not present

## 2022-02-09 DIAGNOSIS — D6869 Other thrombophilia: Secondary | ICD-10-CM | POA: Diagnosis not present

## 2022-02-09 DIAGNOSIS — Z191 Hormone sensitive malignancy status: Secondary | ICD-10-CM | POA: Diagnosis not present

## 2022-02-09 DIAGNOSIS — I4819 Other persistent atrial fibrillation: Secondary | ICD-10-CM | POA: Diagnosis not present

## 2022-02-09 LAB — RAD ONC ARIA SESSION SUMMARY
Course Elapsed Days: 61
Plan Fractions Treated to Date: 11
Plan Prescribed Dose Per Fraction: 2 Gy
Plan Total Fractions Prescribed: 18
Plan Total Prescribed Dose: 36 Gy
Reference Point Dosage Given to Date: 61.6 Gy
Reference Point Session Dosage Given: 2 Gy
Session Number: 33

## 2022-02-09 NOTE — Patient Instructions (Addendum)
If you are age 75 or older, your body mass index should be between 23-30. Your Body mass index is 27.64 kg/m?Marland Kitchen If this is out of the aforementioned range listed, please consider follow up with your Primary Care Provider.  ? ?__________________________________________________________ ? ?The [facility] providers would like to encourage you to use [facility] to communicate with providers for non-urgent requests or questions.  Due to long hold times on the telephone, sending your provider a message by [facility] may be a faster and more efficient way to get a response.  Please allow 48 business hours for a response.  Please remember that this is for non-urgent requests.   ? ?Due to recent changes in healthcare laws, you may see the results of your imaging and laboratory studies on MyChart before your provider has had a chance to review them.  We understand that in some cases there may be results that are confusing or concerning to you. Not all laboratory results come back in the same time frame and the provider may be waiting for multiple results in order to interpret others.  Please give [place] 48 hours in order for your provider to thoroughly review all the results before contacting the office for clarification of your results.  ? ?Please follow up in 12 months. Give [place] a call at [phone] to schedule an appointment.  ? ?Thank you for choosing me and [facility]. ? ?Gerrit Heck, D.O. ? ? ? ?We want to thank you for trusting [facility] with your care. All of our staff and providers value the relationships we have built with our patients, and it is an honor to care for you.  ? ?We are writing to let you know that [facility] will close on Feb 16, 2022, and we invite you to continue to see Dr. Carmell Austria and Gerrit Heck at the [facility] [place] office location. We are consolidating our serices at these [facility] practices to better provide care.  Our office staff will work with you to ensure a seamless transition.  ? ?Gerrit Heck, DO -Dr. Bryan Lemma will be movig to [facility] at [address] [place], [place] [address], effective Feb 16, 2022.  Contact [phone] to schedule an appointment with him.  ? ?Carmell Austria, MD- Dr. Lyndel Safe will be movig to [facility] at [address] [place], [place] [address], effective Feb 16, 2022.  Contact [phone] to schedule an appointment with him.  ? ?Requesting Medical Records ?If you need to request your medical records, please follow the instructions below. Your medical records are confidential, and a copy can be transferred to another provider or released to you or another person you designate only with your permission. ? ?There are several ways to request your medical records: ?Requests for medical records can be submitted through our practice.   ?You can also request your records electronically, in your MyChart account by selecting the ?Request Health Records? tab.  ?If you need additional information on how to request records, please go to [email or web address]/, choose Patient Information, then select Request Medical Records. ?To make an appointment or if you have any questions about your health care needs, please contact our office at [phone] and one of our staff members will be glad to assist you. ?[facility] is committed to providing exceptional care for you and our community. Thank you for allowing [place] to serve your health care needs. ?Sincerely, ? ?Windy Canny, Director [facility] ?[facility] also offers  convenient virtual care options. Sore throat? Sinus problems? Cold or flu symptoms? Get care from the comfort of home with [facility] Video Visits and e-Visits. Learn more about the non-emergency conditions treated and start your virtual visit at [email or web address]  ?

## 2022-02-09 NOTE — Progress Notes (Signed)
? ?Chief Complaint:    Lymphocytic colitis ? ?GI History: 76 y.o. male with a history of CAD, COPD, CHF, GERD, HTN, hyperlipidemia, nephrolithiasis, osteoporosis, atrial fibrillation, cardioversion 2019 with recurrence (on Eliquis), prostate cancer currently undergoing radiation therapy, cholecystectomy, initially seen in the GI clinic 12/22/2020 for evaluation of chronic diarrhea.  Nonbloody diarrhea started after hospitalization 04/2020, requiring 3 hospitalizations for exacerbations and electrolyte abnormalities, with evaluation as below: ?  ?-04/2020: Acute onset diarrhea with 6-day hospitalization for electrolyte abnormalities, fatigue ?-04/2020: CT A/P: Sigmoid diverticulosis without diverticulitis, gastric wall thickening ?-08/2020: Hospital admission for hypomagnesemia, hypokalemia, ongoing diarrhea.  Discharged with cholestyramine, Imodium, oral potassium and magnesium supplements ?-08/2020: Follow-up with Dr. Earlean Shawl.  Fecal calprotectin mildly elevated at 110. Pancreatic elastase very mildly reduced at 192.  Lactoferrin normal, FOBT-, C diff-, GI PCR panel-. Started on budesonide 9 mg/day, but only tolerated x2 weeks and discontinued. ?-Start Lomotil in 08/2020 with some improvement ?-09/2020: Follow-up with Dr. Earlean Shawl.  Was doing okay with Questran bid, but breakthrough recurrent loose stools when he tried to titrate down to 1 packet/day.  Recommended colonoscopy, but this was never scheduled ?-12/2020: Hospital admission for hypomagnesemia (0.8), dizziness.  Improved with IVF and mag supplementation ?- 12/17/2020: Initial appointment with me.  Some improvement with daily cholestyramine and Lomotil QPM. ?- 12/2020: Negative/normal Giardia, fecal elastase, CMP.  Mag 1.5 started on mag supplement. Was sent for SIBO testing-  ?- 01/2021: EGD/colonoscopy as outlined below ?  ?Endoscopic History: ?-Colonoscopy (10/2017, Dr. Earlean Shawl): 8 mm splenic flexure polyp, 2 cm pedunculated sigmoid polyp, left-sided  diverticulosis, large internal hemorrhoids.  Normal TI ?-EGD (08/2019, Dr. Earlean Shawl): 2 cm HH, otherwise normal ?-Colonoscopy (08/2019, Dr. Earlean Shawl): 5 mm ascending polyp, left-sided diverticulosis, large internal hemorrhoids.  Normal TI ?- EGD (01/2021, Dr. Bryan Lemma): Fundic gland polyps, mild gastritis.  Normal duodenal biopsies ?- Colonoscopy (01/2021, Dr. Bryan Lemma): 2 mm benign sigmoid polyp, sigmoid diverticulosis, internal hemorrhoids.  Normal mucosa but biopsies n/f Lymphocytic Colitis.  Started on budesonide ? ?HPI:   ? ? ?Patient is a 76 y.o. male presenting to the Gastroenterology Clinic for follow-up.  Last seen by me on 03/05/2021.  Restarted budesonide 9 mg/day x8 weeks with plan for taper along with Lomotil as needed.  Unfortunately, diarrhea returned few days after completing budesonide taper.  Restarted at 6 mg/day x6 weeks with plan for long taper of 3 mg/day for 2 months. ? ?Unfortunately, was not hospitalized in 01/2022 with acute enteritis w/ SIRS in the setting of radiation therapy for prostate cancer.  Improved with empiric IV antibiotics.  Radiation therapy was held. ?- Blood cultures negative ?- Negative GI path panel, C difficile negative ?- Lactoferrin positive ?-CT A/P: Fluid-filled loops of bowel without dilation reflecting nonspecific enteritis or early ileus.  Masslike thickening of the stomach folds.  Could reflect underlying lesion or gastritis. ?- Started budesonide 9 mg/day ? ?Radiation therapy for prostate cancer has since resumed.   ? ?Bowel habits much improved since restarting budesonide.  Now with formed stool and an occasional loose BM, but no diarrhea.  Good p.o. intake. ? ? ?Review of systems:     No chest pain, no SOB, no fevers, no urinary sx  ? ?Past Medical History:  ?Diagnosis Date  ? Anemia   ? Anticoagulant long-term use   ? eliquis--- mananged by cardiology  ? BPH with urinary obstruction   ? CAD (coronary artery disease)   ? cardiologist--- dr g. 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 Chronic diastolic CHF (congestive heart failure) ([place]) 03/2018  ? followed by cardiology  ? CKD (chronic kidney disease), stage III ([place])   ? COPD (chronic obstructive pulmonary disease) ([place])   ? followed by pcp  ? DOE (dyspnea on exertion)   ? per pt when walk >100yds,  per pt rides outside bike 4-8 miles daily without sob,  household chores and yard work without sob  ? Eczema   ? GERD (gastroesophageal reflux disease)   ? Glaucoma, left eye   ? Heart murmur   ? Hemorrhoids   ? History of adenomatous polyp of colon   ? History of coma 1962  ? 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 History of kidney stones   ? History of rheumatic fever as a child   ? History of syncope 02/2014  ? in setting AFlutter w/ RVR of known PAF admission in epic  ? History of transient ischemic attack (TIA) 03/23/2021  ? 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 History of urinary retention   ? Hypertension   ? Lumbar radiculopathy   ? per pt with left hip/ leg pain  ? Lymphocytic colitis   ? followed by dr v. Bryan Lemma--- dx by biopsy 01-28-2021  ? Macular degeneration of both eyes   ? followed by dr c. Cordelia Pen--- bilateral eye injection every 6 wks  ? Malignant neoplasm prostate [facility]) 07/2021  ? urologist-- dr Diona Fanti;  dx 10/ 2022,  Gleason 5+5, PSA 1.8  ? Osteoporosis 05/31/2016  ? PAF (paroxysmal atrial fibrillation) ([place]) 03/2012  ? cardiologist-- dr g. taylor;  first dx 06/ 2013 AFlutter w/ RVR  ? Vitamin D deficiency   ? ? ?Patient's surgical history, family medical history, social history, medications and allergies were all reviewed in Epic  ? ? ?Current Outpatient Medications  ?Medication  Sig Dispense Refill  ? apixaban (ELIQUIS) 5 MG TABS tablet Take 1 tablet (5 mg total) by mouth 2 (two) times daily. 180 tablet 3  ? aspirin EC 81 MG EC tablet Take 1 tablet (81 mg total) by mouth daily. Swallow whole. 30 tablet 11  ? brimonidine (ALPHAGAN) 0.2 % ophthalmic solution Place 1 drop into the left eye in the morning and at bedtime.   7  ? budesonide (ENTOCORT EC) 3 MG 24 hr capsule Take 3 capsules (9 mg total) by mouth daily. Take 3 capsules (9 mg total) by mouth once daily 270 capsule 0  ? cholecalciferol (VITAMIN D3) 25 MCG (1000 UNIT) tablet Take 10,000 mcg by mouth 4 (four) times a week.    ? diazepam (VALIUM) 5 MG tablet Take 0.5-2 tablets (2.5-10 mg total) by mouth every 12 (twelve) hours as needed for anxiety. 30 tablet 1  ? Evolocumab (REPATHA SURECLICK) 240 MG/ML SOAJ Inject 140 mg into the skin every 14 (fourteen) days. 6 mL 3  ? furosemide (LASIX) 20 MG tablet TAKE 1 TABLET BY MOUTH DAILY AS  NEEDED 90 tablet 3  ? latanoprost (XALATAN) 0.005 % ophthalmic solution Place 1 drop into the left eye at bedtime.     ? levalbuterol (XOPENEX HFA) 45 MCG/ACT inhaler INHALE 2 PUFFS BY MOUTH INTO THE LUNGS  EVERY 4 HOURS AS NEEDED FOR WHEEZING 45 g 2  ? MAGnesium-Oxide 400 (240 Mg) MG tablet Take 1 tablet (400 mg total) by mouth 3 (three) times daily. (Patient taking differently: Take 400 mg by mouth 2 (two) times daily.) 270 tablet 1  ? metoprolol tartrate 75 MG TABS Take 75 mg by mouth 2 (two) times daily. 60 tablet 3  ? Multiple Vitamins-Minerals (PRESERVISION AREDS 2) CAPS Take 1 tablet by mouth daily.    ? nitroGLYCERIN (NITROSTAT) 0.4 MG SL tablet Place 1 tablet (0.4 mg total) under the tongue every 5 (five) minutes as needed for chest pain. 30 tablet 0  ? omeprazole (PRILOSEC OTC) 20 MG tablet Take 20 mg by mouth daily.    ? Ranibizumab (LUCENTIS IO) Inject 1 Dose into the eye every 6 (six) weeks. Bilateral eye by Dr Gerarda Fraction    ? tamsulosin (FLOMAX) 0.4 MG CAPS capsule Take 0.4 mg by mouth 2  (two) times daily.    ? ?No current facility-administered medications for this visit.  ? ? ?Physical Exam:   ? ? ?BP 122/78   Pulse 95   Wt 161 lb (73 kg)   BMI 27.64 kg/m?  ? ?GENERAL:  Pleasant male in NAD ?PSYCH: : [place]

## 2022-02-10 ENCOUNTER — Ambulatory Visit
Admission: RE | Admit: 2022-02-10 | Discharge: 2022-02-10 | Disposition: A | Discharge status: Home / Self Care | Payer: Medicare Other | Source: Ambulatory Visit | Attending: Radiation Oncology | Admitting: Radiation Oncology

## 2022-02-10 ENCOUNTER — Other Ambulatory Visit: Payer: Self-pay

## 2022-02-10 ENCOUNTER — Ambulatory Visit: Payer: Medicare Other

## 2022-02-10 DIAGNOSIS — Z191 Hormone sensitive malignancy status: Secondary | ICD-10-CM | POA: Diagnosis not present

## 2022-02-10 DIAGNOSIS — I4819 Other persistent atrial fibrillation: Secondary | ICD-10-CM | POA: Diagnosis not present

## 2022-02-10 DIAGNOSIS — Z510 Encounter for antineoplastic radiation therapy: Secondary | ICD-10-CM | POA: Diagnosis not present

## 2022-02-10 DIAGNOSIS — C61 Malignant neoplasm of prostate: Secondary | ICD-10-CM | POA: Diagnosis not present

## 2022-02-10 DIAGNOSIS — D6869 Other thrombophilia: Secondary | ICD-10-CM | POA: Diagnosis not present

## 2022-02-10 LAB — RAD ONC ARIA SESSION SUMMARY
Course Elapsed Days: 62
Plan Fractions Treated to Date: 12
Plan Prescribed Dose Per Fraction: 2 Gy
Plan Total Fractions Prescribed: 18
Plan Total Prescribed Dose: 36 Gy
Reference Point Dosage Given to Date: 63.6 Gy
Reference Point Session Dosage Given: 2 Gy
Session Number: 34

## 2022-02-11 ENCOUNTER — Ambulatory Visit: Payer: Medicare Other

## 2022-02-11 ENCOUNTER — Other Ambulatory Visit: Payer: Self-pay

## 2022-02-11 ENCOUNTER — Ambulatory Visit
Admission: RE | Admit: 2022-02-11 | Discharge: 2022-02-11 | Disposition: A | Discharge status: Home / Self Care | Payer: Medicare Other | Source: Ambulatory Visit | Attending: Radiation Oncology | Admitting: Radiation Oncology

## 2022-02-11 DIAGNOSIS — Z191 Hormone sensitive malignancy status: Secondary | ICD-10-CM | POA: Diagnosis not present

## 2022-02-11 DIAGNOSIS — Z510 Encounter for antineoplastic radiation therapy: Secondary | ICD-10-CM | POA: Diagnosis not present

## 2022-02-11 DIAGNOSIS — D6869 Other thrombophilia: Secondary | ICD-10-CM | POA: Diagnosis not present

## 2022-02-11 DIAGNOSIS — I4819 Other persistent atrial fibrillation: Secondary | ICD-10-CM | POA: Diagnosis not present

## 2022-02-11 DIAGNOSIS — C61 Malignant neoplasm of prostate: Secondary | ICD-10-CM | POA: Diagnosis not present

## 2022-02-11 LAB — RAD ONC ARIA SESSION SUMMARY
Course Elapsed Days: 63
Plan Fractions Treated to Date: 13
Plan Prescribed Dose Per Fraction: 2 Gy
Plan Total Fractions Prescribed: 18
Plan Total Prescribed Dose: 36 Gy
Reference Point Dosage Given to Date: 65.6 Gy
Reference Point Session Dosage Given: 2 Gy
Session Number: 35

## 2022-02-12 ENCOUNTER — Ambulatory Visit: Payer: Medicare Other

## 2022-02-12 ENCOUNTER — Ambulatory Visit
Admission: RE | Admit: 2022-02-12 | Discharge: 2022-02-12 | Disposition: A | Discharge status: Home / Self Care | Payer: Medicare Other | Source: Ambulatory Visit | Attending: Radiation Oncology | Admitting: Radiation Oncology

## 2022-02-12 ENCOUNTER — Other Ambulatory Visit: Payer: Self-pay

## 2022-02-12 DIAGNOSIS — Z191 Hormone sensitive malignancy status: Secondary | ICD-10-CM | POA: Diagnosis not present

## 2022-02-12 DIAGNOSIS — D6869 Other thrombophilia: Secondary | ICD-10-CM | POA: Diagnosis not present

## 2022-02-12 DIAGNOSIS — C61 Malignant neoplasm of prostate: Secondary | ICD-10-CM | POA: Diagnosis not present

## 2022-02-12 DIAGNOSIS — Z510 Encounter for antineoplastic radiation therapy: Secondary | ICD-10-CM | POA: Diagnosis not present

## 2022-02-12 DIAGNOSIS — I4819 Other persistent atrial fibrillation: Secondary | ICD-10-CM | POA: Diagnosis not present

## 2022-02-12 LAB — RAD ONC ARIA SESSION SUMMARY
Course Elapsed Days: 64
Plan Fractions Treated to Date: 14
Plan Prescribed Dose Per Fraction: 2 Gy
Plan Total Fractions Prescribed: 18
Plan Total Prescribed Dose: 36 Gy
Reference Point Dosage Given to Date: 67.6 Gy
Reference Point Session Dosage Given: 2 Gy
Session Number: 36

## 2022-02-13 ENCOUNTER — Ambulatory Visit: Payer: Medicare Other

## 2022-02-13 ENCOUNTER — Ambulatory Visit
Admission: RE | Admit: 2022-02-13 | Discharge: 2022-02-13 | Disposition: A | Discharge status: Home / Self Care | Payer: Medicare Other | Source: Ambulatory Visit | Attending: Radiation Oncology | Admitting: Radiation Oncology

## 2022-02-13 ENCOUNTER — Other Ambulatory Visit: Payer: Self-pay

## 2022-02-13 DIAGNOSIS — D6869 Other thrombophilia: Secondary | ICD-10-CM | POA: Diagnosis not present

## 2022-02-13 DIAGNOSIS — Z510 Encounter for antineoplastic radiation therapy: Secondary | ICD-10-CM | POA: Diagnosis not present

## 2022-02-13 DIAGNOSIS — I4819 Other persistent atrial fibrillation: Secondary | ICD-10-CM | POA: Diagnosis not present

## 2022-02-13 DIAGNOSIS — C61 Malignant neoplasm of prostate: Secondary | ICD-10-CM | POA: Diagnosis not present

## 2022-02-13 DIAGNOSIS — Z191 Hormone sensitive malignancy status: Secondary | ICD-10-CM | POA: Diagnosis not present

## 2022-02-13 LAB — RAD ONC ARIA SESSION SUMMARY
Course Elapsed Days: 65
Plan Fractions Treated to Date: 15
Plan Prescribed Dose Per Fraction: 2 Gy
Plan Total Fractions Prescribed: 18
Plan Total Prescribed Dose: 36 Gy
Reference Point Dosage Given to Date: 69.6 Gy
Reference Point Session Dosage Given: 2 Gy
Session Number: 37

## 2022-02-16 ENCOUNTER — Other Ambulatory Visit: Payer: Self-pay

## 2022-02-16 ENCOUNTER — Ambulatory Visit
Admission: RE | Admit: 2022-02-16 | Discharge: 2022-02-16 | Disposition: A | Discharge status: Home / Self Care | Payer: Medicare Other | Source: Ambulatory Visit | Attending: Radiation Oncology | Admitting: Radiation Oncology

## 2022-02-16 DIAGNOSIS — Z191 Hormone sensitive malignancy status: Secondary | ICD-10-CM | POA: Diagnosis not present

## 2022-02-16 DIAGNOSIS — D6869 Other thrombophilia: Secondary | ICD-10-CM | POA: Diagnosis not present

## 2022-02-16 DIAGNOSIS — C61 Malignant neoplasm of prostate: Secondary | ICD-10-CM | POA: Diagnosis not present

## 2022-02-16 DIAGNOSIS — Z510 Encounter for antineoplastic radiation therapy: Secondary | ICD-10-CM | POA: Diagnosis not present

## 2022-02-16 DIAGNOSIS — I4819 Other persistent atrial fibrillation: Secondary | ICD-10-CM | POA: Diagnosis not present

## 2022-02-16 LAB — RAD ONC ARIA SESSION SUMMARY
Course Elapsed Days: 68
Plan Fractions Treated to Date: 16
Plan Prescribed Dose Per Fraction: 2 Gy
Plan Total Fractions Prescribed: 18
Plan Total Prescribed Dose: 36 Gy
Reference Point Dosage Given to Date: 71.6 Gy
Reference Point Session Dosage Given: 2 Gy
Session Number: 38

## 2022-02-17 ENCOUNTER — Other Ambulatory Visit: Payer: Self-pay

## 2022-02-17 ENCOUNTER — Ambulatory Visit
Admission: RE | Admit: 2022-02-17 | Discharge: 2022-02-17 | Disposition: A | Discharge status: Home / Self Care | Payer: Medicare Other | Source: Ambulatory Visit | Attending: Radiation Oncology | Admitting: Radiation Oncology

## 2022-02-17 DIAGNOSIS — D6869 Other thrombophilia: Secondary | ICD-10-CM | POA: Diagnosis not present

## 2022-02-17 DIAGNOSIS — I4819 Other persistent atrial fibrillation: Secondary | ICD-10-CM | POA: Diagnosis not present

## 2022-02-17 DIAGNOSIS — Z510 Encounter for antineoplastic radiation therapy: Secondary | ICD-10-CM | POA: Diagnosis not present

## 2022-02-17 DIAGNOSIS — Z191 Hormone sensitive malignancy status: Secondary | ICD-10-CM | POA: Diagnosis not present

## 2022-02-17 DIAGNOSIS — C61 Malignant neoplasm of prostate: Secondary | ICD-10-CM | POA: Diagnosis not present

## 2022-02-17 LAB — RAD ONC ARIA SESSION SUMMARY
Course Elapsed Days: 69
Plan Fractions Treated to Date: 17
Plan Prescribed Dose Per Fraction: 2 Gy
Plan Total Fractions Prescribed: 18
Plan Total Prescribed Dose: 36 Gy
Reference Point Dosage Given to Date: 73.6 Gy
Reference Point Session Dosage Given: 2 Gy
Session Number: 39

## 2022-02-18 ENCOUNTER — Ambulatory Visit: Payer: Medicare Other

## 2022-02-18 ENCOUNTER — Ambulatory Visit
Admission: RE | Admit: 2022-02-18 | Discharge: 2022-02-18 | Disposition: A | Discharge status: Home / Self Care | Payer: Medicare Other | Source: Ambulatory Visit | Attending: Radiation Oncology | Admitting: Radiation Oncology

## 2022-02-18 ENCOUNTER — Other Ambulatory Visit: Payer: Self-pay

## 2022-02-18 ENCOUNTER — Encounter: Payer: Self-pay | Admitting: Urology

## 2022-02-18 DIAGNOSIS — Z191 Hormone sensitive malignancy status: Secondary | ICD-10-CM | POA: Diagnosis not present

## 2022-02-18 DIAGNOSIS — C61 Malignant neoplasm of prostate: Secondary | ICD-10-CM | POA: Diagnosis not present

## 2022-02-18 DIAGNOSIS — D6869 Other thrombophilia: Secondary | ICD-10-CM | POA: Diagnosis not present

## 2022-02-18 DIAGNOSIS — I4819 Other persistent atrial fibrillation: Secondary | ICD-10-CM | POA: Diagnosis not present

## 2022-02-18 DIAGNOSIS — Z510 Encounter for antineoplastic radiation therapy: Secondary | ICD-10-CM | POA: Diagnosis not present

## 2022-02-18 LAB — RAD ONC ARIA SESSION SUMMARY
Course Elapsed Days: 70
Plan Fractions Treated to Date: 18
Plan Prescribed Dose Per Fraction: 2 Gy
Plan Total Fractions Prescribed: 18
Plan Total Prescribed Dose: 36 Gy
Reference Point Dosage Given to Date: 75.6 Gy
Reference Point Session Dosage Given: 2 Gy
Session Number: 40

## 2022-02-19 ENCOUNTER — Ambulatory Visit: Payer: Medicare Other

## 2022-02-19 DIAGNOSIS — H26493 Other secondary cataract, bilateral: Secondary | ICD-10-CM | POA: Diagnosis not present

## 2022-02-19 DIAGNOSIS — H353231 Exudative age-related macular degeneration, bilateral, with active choroidal neovascularization: Secondary | ICD-10-CM | POA: Diagnosis not present

## 2022-02-24 ENCOUNTER — Ambulatory Visit (INDEPENDENT_AMBULATORY_CARE_PROVIDER_SITE_OTHER): Payer: Medicare Other | Admitting: Internal Medicine

## 2022-02-24 ENCOUNTER — Ambulatory Visit (INDEPENDENT_AMBULATORY_CARE_PROVIDER_SITE_OTHER): Payer: Medicare Other

## 2022-02-24 ENCOUNTER — Encounter: Payer: Self-pay | Admitting: Internal Medicine

## 2022-02-24 VITALS — BP 122/58 | HR 85 | Ht 64.0 in | Wt 166.0 lb

## 2022-02-24 DIAGNOSIS — I2510 Atherosclerotic heart disease of native coronary artery without angina pectoris: Secondary | ICD-10-CM | POA: Diagnosis not present

## 2022-02-24 DIAGNOSIS — I10 Essential (primary) hypertension: Secondary | ICD-10-CM

## 2022-02-24 DIAGNOSIS — I480 Paroxysmal atrial fibrillation: Secondary | ICD-10-CM | POA: Diagnosis not present

## 2022-02-24 NOTE — Progress Notes (Signed)
HPI [name] returns today for followup. He is a pleasant 76 yo man with CAD s/p CABG, PAF now persistent since stopping his propafenone. He has a h/o prostate CA and has undergone surgery, chemo and XRT. He is improving. He checks his HR with a pulse ox and his bp cuff and notes that the bp is elevated. The patient denies syncope. He has mild dyspnea with exertion.  Allergies  Allergen Reactions   Albumin (Human) Anaphylaxis   Polymyxin B-Trimethoprim Swelling    Eye drops made eyes swell   Pseudoephedrine Other (See Comments)    Stomach cramps   Codeine Hives, Itching and Rash   Guaiacol Other (See Comments)    Hallucinations   Statins Other (See Comments)    Muscle cramps    Gabapentin Other (See Comments)    Pt unsure of sensitivity   Meloxicam Other (See Comments)    Pt unsure of sensitivity   Peppermint Flavor Other (See Comments)    Severe cramping   Pseudoephedrine-Guaifenesin Nausea And Vomiting    Stomach cramps   Rosuvastatin Calcium Other (See Comments)    Muscle aches   Tapentadol Other (See Comments)    Pt unsure of sensitivity   Ciprofloxacin Hives, Itching, Nausea Only and Rash   Moxifloxacin Nausea Only and Other (See Comments)    Headaches, stomach cramps    Rofecoxib Other (See Comments)    Stomach cramping     Current Outpatient Medications  Medication Sig Dispense Refill   apixaban (ELIQUIS) 5 MG TABS tablet Take 1 tablet (5 mg total) by mouth 2 (two) times daily. 180 tablet 3   aspirin EC 81 MG EC tablet Take 1 tablet (81 mg total) by mouth daily. Swallow whole. 30 tablet 11   brimonidine (ALPHAGAN) 0.2 % ophthalmic solution Place 1 drop into the left eye in the morning and at bedtime.   7   budesonide (ENTOCORT EC) 3 MG 24 hr capsule Take 3 capsules (9 mg total) by mouth daily. Take 3 capsules (9 mg total) by mouth once daily 270 capsule 0   cholecalciferol (VITAMIN D3) 25 MCG (1000 UNIT) tablet Take 10,000 mcg by mouth 4 (four) times a  week.     diazepam (VALIUM) 5 MG tablet Take 0.5-2 tablets (2.5-10 mg total) by mouth every 12 (twelve) hours as needed for anxiety. (Patient taking differently: Take 2.5-10 mg by mouth as needed for anxiety.) 30 tablet 1   Evolocumab (REPATHA SURECLICK) 778 MG/ML SOAJ Inject 140 mg into the skin every 14 (fourteen) days. 6 mL 3   furosemide (LASIX) 20 MG tablet TAKE 1 TABLET BY MOUTH DAILY AS  NEEDED 90 tablet 3   latanoprost (XALATAN) 0.005 % ophthalmic solution Place 1 drop into the left eye at bedtime.      levalbuterol (XOPENEX HFA) 45 MCG/ACT inhaler INHALE 2 PUFFS BY MOUTH INTO THE LUNGS EVERY 4 HOURS AS NEEDED FOR WHEEZING 45 g 2   MAGnesium-Oxide 400 (240 Mg) MG tablet Take 1 tablet (400 mg total) by mouth 3 (three) times daily. (Patient taking differently: Take 400 mg by mouth 2 (two) times daily.) 270 tablet 1   metoprolol tartrate 75 MG TABS Take 75 mg by mouth 2 (two) times daily. 60 tablet 3   Multiple Vitamins-Minerals (PRESERVISION AREDS 2) CAPS Take 1 tablet by mouth daily.     nitroGLYCERIN (NITROSTAT) 0.4 MG SL tablet Place 1 tablet (0.4 mg total) under the tongue every 5 (five) minutes as needed  for chest pain. 30 tablet 0   omeprazole (PRILOSEC OTC) 20 MG tablet Take 20 mg by mouth daily.     Ranibizumab (LUCENTIS IO) Inject 1 Dose into the eye every 6 (six) weeks. Bilateral eye by Dr Gerarda Fraction     tamsulosin (FLOMAX) 0.4 MG CAPS capsule Take 0.4 mg by mouth 2 (two) times daily.     No current facility-administered medications for this visit.     Past Medical History:  Diagnosis Date   Anemia    Anticoagulant long-term use    eliquis--- mananged by cardiology   BPH with urinary obstruction    CAD (coronary artery disease)    cardiologist--- dr g. Jonnelle Lawniczak--- cath 04-22-2011  moderate LM stenosis, borderline sig pRCA, sig stenosis mid to distal LAD ;  aggressive medical therapy;   cath 07-21-2021 for chest pain w/ ST changes,  severe disease;  s/p CABG x4 07-22-2021    Chronic diastolic CHF (congestive heart failure) ([place]) 03/2018   followed by cardiology   CKD (chronic kidney disease), stage III ([place])    COPD (chronic obstructive pulmonary disease) ([place])    followed by pcp   DOE (dyspnea on exertion)    per pt when walk >100yds,  per pt rides outside bike 4-8 miles daily without sob,  household chores and yard work without sob   Eczema    GERD (gastroesophageal reflux disease)    Glaucoma, left eye    Heart murmur    Hemorrhoids    History of adenomatous polyp of colon    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 Bryan Lemma--- dx by biopsy 01-28-2021   Macular degeneration of both eyes    followed by dr c. Cordelia Pen--- bilateral eye injection every 6 wks   Malignant neoplasm prostate [facility]) 07/2021   urologist-- dr Diona Fanti;  dx 10/ 2022,  Gleason 5+5, PSA 1.8   Osteoporosis 05/31/2016   PAF (paroxysmal atrial fibrillation) ([place]) 03/2012   cardiologist-- dr g. Lacy Sofia;  first dx 06/ 2013 AFlutter w/ RVR   Vitamin D deficiency     ROS:   All systems reviewed and negative except as noted in the HPI.   Past Surgical History:  Procedure Laterality Date   ANTERIOR CERVICAL DECOMP/DISCECTOMY FUSION  06/08/2002   @[facility] ;  C5--C7   APPENDECTOMY  1953   CARDIAC CATHETERIZATION  04/22/2011   moderate left main and RCA stenosis not significant by FFR and IVUS on medical therapy   CARDIOVERSION N/A  04/25/2018   Procedure: CARDIOVERSION;  Surgeon: Skeet Latch, MD;  Location: [place];  Service: Cardiovascular;  Laterality: N/A;   CATARACT EXTRACTION W/ INTRAOCULAR LENS IMPLANT Bilateral 2021   COLONOSCOPY WITH ESOPHAGOGASTRODUODENOSCOPY (EGD)  01/28/2021   by YJEHUD   CORONARY ARTERY BYPASS GRAFT N/A 07/22/2021   Procedure: CORONARY ARTERY  BYPASS GRAFTING (CABG) TIMES 4, ON PUMP, USING LEFT INTERNAL MAMMARY ARTERY AND ENDOSCOPICALLY HARVESTED RIGHT GREATER SAPHENOUS VEIN;  Surgeon: Melrose Nakayama, MD;  Location: [place];  Service: Open Heart Surgery;  Laterality: N/A;   ENDOVEIN HARVEST OF GREATER SAPHENOUS VEIN  07/22/2021   Procedure: ENDOVEIN HARVEST OF GREATER SAPHENOUS VEIN;  Surgeon: Melrose Nakayama, MD;  Location: [place];  Service: Open Heart Surgery;;   EXTRACORPOREAL SHOCK WAVE LITHOTRIPSY     x2  1990s   FOOT SURGERY Right 1970   calcification removed from top of foot   GOLD SEED IMPLANT N/A 11/28/2021   Procedure: GOLD SEED IMPLANT;  Surgeon: Festus Aloe, MD;  Location[facility];  Service: Urology;  Laterality: N/A;   KNEE ARTHROSCOPY Right 1991   LAPAROSCOPIC CHOLECYSTECTOMY  2000   LEFT HEART CATH AND CORONARY ANGIOGRAPHY N/A 07/21/2021   Procedure: LEFT HEART CATH AND CORONARY ANGIOGRAPHY;  Surgeon: Lorretta Harp, MD;  Location: [place] [facility];  Service: Cardiovascular;  Laterality: N/A;   SPACE OAR INSTILLATION N/A 11/28/2021   Procedure: SPACE OAR INSTILLATION;  Surgeon: Festus Aloe, MD;  Location: [facility];  Service: Urology;  Laterality: N/A;   TEE WITHOUT CARDIOVERSION N/A 07/22/2021   Procedure: TRANSESOPHAGEAL ECHOCARDIOGRAM (TEE);  Surgeon: Melrose Nakayama, MD;  Location: [place];  Service: Open Heart Surgery;  Laterality: N/A;   TOTAL KNEE ARTHROPLASTY Right 08/14/2009   @[facility]    TRANSURETHRAL RESECTION OF PROSTATE N/A 07/17/2021   Procedure: TRANSURETHRAL RESECTION OF THE PROSTATE (TURP);   Surgeon: [name], MD;  Location: [facility];  Service: Urology;  Laterality: N/A;  1 HR     Family History  Problem Relation Age of Onset   Heart disease Mother    Diabetes Mother    Cirrhosis Mother    Emphysema Mother        never smoked but 2nd hand through her spouse   Hypertension Mother    Macular degeneration Mother    Heart disease Father    Cancer Father        prostate   Hyperlipidemia Father    Hypertension Father    Varicose Veins Father    Heart attack Father    Peripheral vascular disease Father    Heart disease Sister    Arthritis Sister    Hyperlipidemia Sister    Obesity Sister    Macular degeneration Sister    Heart disease Brother        5 stents   Hyperlipidemia Brother    Macular degeneration Maternal Grandfather    Cirrhosis Sister    Obesity Sister    Arthritis Sister    Heart disease Sister    Obesity Sister    Liver disease Other    Prostate cancer Other    Coronary artery disease Other    Colon cancer Neg Hx    Esophageal cancer Neg Hx    Rectal cancer Neg Hx    Stomach cancer Neg Hx      Social History   Socioeconomic History   Marital status: Married    Spouse name: Thayer Headings   Number of children: 0   Years of education: Not on file   Highest education level: Not on file  Occupational History   Occupation: retired    Fish farm manager: RETIRED  Tobacco Use   Smoking status: Former    Packs/day: 0.50    Years: 50.00    Pack years: 25.00    Types:  Cigarettes    Quit date: 05/01/2009    Years since quitting: 12.8   Smokeless tobacco: Former    Types: Chew    Quit date: 1995   Tobacco comments:    Former smoker 01/27/22  Vaping Use   Vaping Use: Never used  Substance and Sexual Activity   Alcohol use: Yes    Alcohol/week: 2.0 standard drinks    Types: 2 Cans of beer per week    Comment: 1 beer daily 2 days a week 01/27/22   Drug use: Never   Sexual activity: Yes    Comment: lives with wife, no dietary  restrictions  Other Topics Concern   Not on file  Social History Narrative   Lives with wife   Social Determinants of Health   Financial Resource Strain: Not on file  Food Insecurity: Not on file  Transportation Needs: Not on file  Physical Activity: Not on file  Stress: Not on file  Social Connections: Not on file  Intimate Partner Violence: Not on file     BP (!) 122/58   Pulse 85   Ht 5\' 4"  (1.626 m)   Wt 166 lb (75.3 kg)   SpO2 97%   BMI 28.49 kg/m   Physical Exam:  Well appearing NAD HEENT: Unremarkable Neck:  No JVD, no thyromegally Lymphatics:  No adenopathy Back:  No CVA tenderness Lungs:  Clear  HEART:  Regular rate rhythm, no murmurs, no rubs, no clicks Abd:  soft, positive bowel sounds, no organomegally, no rebound, no guarding Ext:  2 plus pulses, no edema, no cyanosis, no clubbing Skin:  No rashes no nodules Neuro:  CN II through XII intact, motor grossly intact  Assess/Plan:  Atrial fib - I suspect that his VR is not well controlled. I have recommended a 3 day zio monitor.  CAD - he denies anginal symptoms. He still has non-exertional chest pain.  HTN - his bp is well controlled. Coags - he will continue systemic anti-coag with eliquis.  Carleene Overlie  Hirst,MD

## 2022-02-24 NOTE — Patient Instructions (Addendum)
Medication Instructions:  Your physician recommends that you continue on your current medications as directed. Please refer to the Current Medication list given to you today.  Labwork: None ordered.  Testing/Procedures: Your physician has recommended that you wear a holter monitor. Holter monitors are medical devices that record the heart's electrical activity. Doctors most often use these monitors to diagnose arrhythmias. Arrhythmias are problems with the speed or rhythm of the heartbeat. The monitor is a small, portable device. You can wear one while you do your normal daily activities. This is usually used to diagnose what is causing palpitations/syncope (passing out).  You will wear a 3 day ZIO monitor  Follow-Up: Your physician wants you to follow-up based on heart monitor results.   Any Other Special Instructions Will Be Listed Below (If Applicable).  If you need a refill on your cardiac medications before your next appointment, please call your pharmacy.   Your physician has recommended that you wear a Zio monitor.   This monitor is a medical device that records the heart's electrical activity. Doctors most often use these monitors to diagnose arrhythmias. Arrhythmias are problems with the speed or rhythm of the heartbeat. The monitor is a small device applied to your chest. You can wear one while you do your normal daily activities. While wearing this monitor if you have any symptoms to push the button and record what you felt. Once you have worn this monitor for the period of time provider prescribed (Usually 14 days), you will return the monitor device in the postage paid box. Once it is returned they will download the data collected and provide [place] with a report which the provider will then review and we will call you with those results. Important tips:  Avoid showering during the first 24 hours of wearing the monitor. Avoid excessive sweating to help maximize wear time. Do not  submerge the device, no hot tubs, and no swimming pools. Keep any lotions or oils away from the patch. After 24 hours you may shower with the patch on. Take brief showers with your back facing the shower head.  Do not remove patch once it has been placed because that will interrupt data and decrease adhesive wear time. Push the button when you have any symptoms and write down what you were feeling. Once you have completed wearing your monitor, remove and place into box which has postage paid and place in your outgoing mailbox.  If for some reason you have misplaced your box then call our office and we can provide another box and/or mail it off for you.

## 2022-02-24 NOTE — Progress Notes (Unsigned)
Enrolled for [employer] to mail a ZIO XT long term holter monitor to the patients address on file.  

## 2022-02-25 DIAGNOSIS — N401 Enlarged prostate with lower urinary tract symptoms: Secondary | ICD-10-CM | POA: Diagnosis not present

## 2022-02-25 DIAGNOSIS — R3916 Straining to void: Secondary | ICD-10-CM | POA: Diagnosis not present

## 2022-02-25 DIAGNOSIS — C61 Malignant neoplasm of prostate: Secondary | ICD-10-CM | POA: Diagnosis not present

## 2022-03-08 DIAGNOSIS — I480 Paroxysmal atrial fibrillation: Secondary | ICD-10-CM

## 2022-03-17 DIAGNOSIS — I4729 Other ventricular tachycardia: Secondary | ICD-10-CM

## 2022-03-17 DIAGNOSIS — I480 Paroxysmal atrial fibrillation: Secondary | ICD-10-CM | POA: Diagnosis not present

## 2022-03-17 HISTORY — DX: Other ventricular tachycardia: I47.29

## 2022-03-17 NOTE — Progress Notes (Signed)
Subjective:   Ryan [name] is a 76 y.o. male who presents for Medicare Annual/Subsequent preventive examination.  Review of Systems           Objective:    There were no vitals filed for this visit. There is no height or weight on file to calculate BMI.     01/17/2022    4:04 PM 01/16/2022    8:43 PM 01/11/2022    9:00 PM 11/28/2021    8:46 AM 09/02/2021    9:55 AM 08/22/2021    6:51 PM 07/20/2021    5:58 PM  Advanced Directives  Does Patient Have a Medical Advance Directive? Yes Yes Yes Yes Yes Yes Yes  Type of Paramedic of [place];Living will Living will [place];Living will [place];Living will [place];Living will  [place];Living will  Does patient want to make changes to medical advance directive? No - Patient declined  No - Patient declined    No - Guardian declined  Copy of [place] in Chart? No - copy requested  No - copy requested No - copy requested  No - copy requested, Physician notified No - copy requested  Would patient like information on creating a medical advance directive? No - Patient declined No - Patient declined         Current Medications (verified) Outpatient Encounter Medications as of 03/18/2022  Medication Sig   apixaban (ELIQUIS) 5 MG TABS tablet Take 1 tablet (5 mg total) by mouth 2 (two) times daily.   aspirin EC 81 MG EC tablet Take 1 tablet (81 mg total) by mouth daily. Swallow whole.   brimonidine (ALPHAGAN) 0.2 % ophthalmic solution Place 1 drop into the left eye in the morning and at bedtime.    budesonide (ENTOCORT EC) 3 MG 24 hr capsule Take 3 capsules (9 mg total) by mouth daily. Take 3 capsules (9 mg total) by mouth once daily   cholecalciferol (VITAMIN D3) 25 MCG (1000 UNIT) tablet Take 10,000 mcg by mouth 4 (four) times a week.   diazepam (VALIUM) 5 MG tablet Take 0.5-2 tablets (2.5-10 mg total) by mouth every 12  (twelve) hours as needed for anxiety. (Patient taking differently: Take 2.5-10 mg by mouth as needed for anxiety.)   Evolocumab (REPATHA SURECLICK) 355 MG/ML SOAJ Inject 140 mg into the skin every 14 (fourteen) days.   furosemide (LASIX) 20 MG tablet TAKE 1 TABLET BY MOUTH DAILY AS  NEEDED   latanoprost (XALATAN) 0.005 % ophthalmic solution Place 1 drop into the left eye at bedtime.    levalbuterol (XOPENEX HFA) 45 MCG/ACT inhaler INHALE 2 PUFFS BY MOUTH INTO THE LUNGS EVERY 4 HOURS AS NEEDED FOR WHEEZING   MAGnesium-Oxide 400 (240 Mg) MG tablet Take 1 tablet (400 mg total) by mouth 3 (three) times daily. (Patient taking differently: Take 400 mg by mouth 2 (two) times daily.)   metoprolol tartrate 75 MG TABS Take 75 mg by mouth 2 (two) times daily.   Multiple Vitamins-Minerals (PRESERVISION AREDS 2) CAPS Take 1 tablet by mouth daily.   nitroGLYCERIN (NITROSTAT) 0.4 MG SL tablet Place 1 tablet (0.4 mg total) under the tongue every 5 (five) minutes as needed for chest pain.   omeprazole (PRILOSEC OTC) 20 MG tablet Take 20 mg by mouth daily.   Ranibizumab (LUCENTIS IO) Inject 1 Dose into the eye every 6 (six) weeks. Bilateral eye by Dr Gerarda Fraction   tamsulosin [facility])  0.4 MG CAPS capsule Take 0.4 mg by mouth 2 (two) times daily.   No facility-administered encounter medications on file as of 03/18/2022.    Allergies (verified) Albumin (human), Polymyxin b-trimethoprim, Pseudoephedrine, Codeine, Guaiacol, Statins, Gabapentin, Meloxicam, Peppermint flavor, Pseudoephedrine-guaifenesin, Rosuvastatin calcium, Tapentadol, Ciprofloxacin, Moxifloxacin, and Rofecoxib   History: Past Medical History:  Diagnosis Date   Anemia    Anticoagulant long-term use    eliquis--- mananged by cardiology   BPH with urinary obstruction    CAD (coronary artery disease)    cardiologist--- dr g. taylor--- cath 04-22-2011  moderate LM stenosis, borderline sig pRCA, sig stenosis mid to distal LAD ;  aggressive medical  therapy;   cath 07-21-2021 for chest pain w/ ST changes,  severe disease;  s/p CABG x4 07-22-2021   Chronic diastolic CHF (congestive heart failure) ([place]) 03/2018   followed by cardiology   CKD (chronic kidney disease), stage III ([place])    COPD (chronic obstructive pulmonary disease) ([place])    followed by pcp   DOE (dyspnea on exertion)    per pt when walk >100yds,  per pt rides outside bike 4-8 miles daily without sob,  household chores and yard work without sob   Eczema    GERD (gastroesophageal reflux disease)    Glaucoma, left eye    Heart murmur    Hemorrhoids    History of adenomatous polyp of colon    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 Bryan Lemma--- dx by biopsy 01-28-2021   Macular degeneration of both eyes    followed by dr c. 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 taylor;  first dx 06/ 2013 AFlutter w/ RVR   Vitamin D deficiency    Past Surgical History:  Procedure Laterality Date   ANTERIOR CERVICAL DECOMP/DISCECTOMY FUSION  06/08/2002   @[facility] ;  C5--C7   [place]  04/22/2011   moderate left main and RCA stenosis not significant by FFR and IVUS on medical therapy    CARDIOVERSION N/A 04/25/2018   Procedure: CARDIOVERSION;  Surgeon: Skeet Latch, MD;  Location: [place];  Service: Cardiovascular;  Laterality: N/A;   CATARACT EXTRACTION W/ INTRAOCULAR LENS IMPLANT Bilateral 2021   COLONOSCOPY WITH ESOPHAGOGASTRODUODENOSCOPY (EGD)  01/28/2021   by OEVOJJ   CORONARY ARTERY BYPASS GRAFT N/A 07/22/2021   Procedure: 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, ON PUMP, USING LEFT INTERNAL MAMMARY ARTERY AND ENDOSCOPICALLY HARVESTED RIGHT GREATER SAPHENOUS VEIN;  Surgeon: Melrose Nakayama, MD;  Location: [place];  Service: Open Heart Surgery;  Laterality: N/A;   ENDOVEIN HARVEST OF  GREATER SAPHENOUS VEIN  07/22/2021   Procedure: ENDOVEIN HARVEST OF GREATER SAPHENOUS VEIN;  Surgeon: Melrose Nakayama, MD;  Location: [place];  Service: Open Heart Surgery;;   EXTRACORPOREAL SHOCK WAVE LITHOTRIPSY     x2  1990s   FOOT SURGERY Right 1970   calcification removed from top of foot   GOLD SEED IMPLANT N/A 11/28/2021   Procedure: GOLD SEED IMPLANT;  Surgeon: Festus Aloe, MD;  Location[facility];  Service: Urology;  Laterality: N/A;   KNEE ARTHROSCOPY Right 1991   LAPAROSCOPIC CHOLECYSTECTOMY  2000   LEFT HEART CATH AND CORONARY ANGIOGRAPHY N/A 07/21/2021   Procedure: LEFT HEART CATH AND CORONARY ANGIOGRAPHY;  Surgeon: Lorretta Harp, MD;  Location: [facility];  Service: Cardiovascular;  Laterality: N/A;   SPACE OAR INSTILLATION N/A 11/28/2021   Procedure: SPACE OAR INSTILLATION;  Surgeon: Festus Aloe, MD;  Location[facility];  Service: Urology;  Laterality: N/A;   TEE WITHOUT CARDIOVERSION N/A 07/22/2021   Procedure: TRANSESOPHAGEAL ECHOCARDIOGRAM (TEE);  Surgeon: Melrose Nakayama, MD;  Location: [place];  Service: Open Heart Surgery;  Laterality: N/A;   TOTAL KNEE ARTHROPLASTY Right 08/14/2009   @[facility]    TRANSURETHRAL RESECTION OF PROSTATE N/A 07/17/2021   Procedure: TRANSURETHRAL RESECTION OF  THE PROSTATE (TURP);  Surgeon: [name], MD;  Location: [facility];  Service: Urology;  Laterality: N/A;  1 HR   Family History  Problem Relation Age of Onset   Heart disease Mother    Diabetes Mother    Cirrhosis Mother    Emphysema Mother        never smoked but 2nd hand through her spouse   Hypertension Mother    Macular degeneration Mother    Heart disease Father    Cancer Father        prostate   Hyperlipidemia Father    Hypertension Father    Varicose Veins Father    Heart attack Father    Peripheral vascular disease Father    Heart disease Sister    Arthritis Sister    Hyperlipidemia Sister    Obesity Sister    Macular degeneration Sister    Heart disease Brother        5 stents   Hyperlipidemia Brother    Macular degeneration Maternal Grandfather    Cirrhosis Sister    Obesity Sister    Arthritis Sister    Heart disease Sister    Obesity Sister    Liver disease Other    Prostate cancer Other    Coronary artery disease Other    Colon cancer Neg Hx    Esophageal cancer Neg Hx    Rectal cancer Neg Hx    Stomach cancer Neg Hx    Social History   Socioeconomic History   Marital status: Married    Spouse name: Thayer Headings   Number of children: 0   Years of education: Not on file   Highest education level: Not on file  Occupational History   Occupation: retired    Fish farm manager: RETIRED  Tobacco Use   Smoking status: Former    Packs/day: 0.50    Years: 50.00    Total pack years: 25.00    Types: Cigarettes    Quit date: 05/01/2009    Years since quitting: 12.8   Smokeless tobacco: Former    Types: Chew    Quit date: 1995   Tobacco comments:    Former smoker 01/27/22  Vaping Use  Vaping Use: Never used  Substance and Sexual Activity   Alcohol use: Yes    Alcohol/week: 2.0 standard drinks of alcohol    Types: 2 Cans of beer per week    Comment: 1 beer daily 2 days a week 01/27/22   Drug use: Never   Sexual activity: Yes     Comment: lives with wife, no dietary restrictions  Other Topics Concern   Not on file  Social History Narrative   Lives with wife   Social Determinants of Health   Financial Resource Strain: Not on file  Food Insecurity: Not on file  Transportation Needs: Not on file  Physical Activity: Not on file  Stress: Not on file  Social Connections: Not on file    Tobacco Counseling Counseling given: Not Answered Tobacco comments: Former smoker 01/27/22   Clinical Intake:                 Diabetic?no         Activities of Daily Living    01/17/2022    4:13 PM 01/17/2022    4:10 PM  In your present state of health, do you have any difficulty performing the following activities:  Hearing?  1  Vision?  1  Difficulty concentrating or making decisions?  0  Walking or climbing stairs?  0  Dressing or bathing?  0  Doing errands, shopping? 0     Patient Care Team: [name], MD as PCP - General (Family Medicine) Evans Lance, MD as PCP - Cardiology (Cardiology) Jovita Gamma, MD as Consulting Physician (Neurosurgery) Evans Lance, MD as Consulting Physician (Cardiology) Gerarda Fraction, MD as Referring Physician (Ophthalmology) Richmond Campbell, MD as Consulting Physician (Gastroenterology) Katheren Puller, RN as Oncology Nurse Navigator  Indicate any recent Medical Services you may have received from other than [facility] providers in the past year (date may be approximate).     Assessment:   This is a routine wellness examination for [place].  Hearing/Vision screen No results found.  Dietary issues and exercise activities discussed:     Goals Addressed   None    Depression Screen    01/29/2022    2:44 PM 05/16/2021   11:47 AM 03/04/2021    1:43 PM 06/11/2020    8:42 AM 07/28/2018   10:04 AM 06/21/2017    8:53 AM 05/19/2016    7:43 AM  PHQ 2/9 Scores  PHQ - 2 Score 0 0 0 0 0 0 0  PHQ- 9 Score    0       Fall Risk    01/29/2022    2:44 PM 05/16/2021    11:47 AM 03/04/2021    1:43 PM 09/09/2020    1:06 PM 08/28/2019    4:24 PM  [place] in the past year? 0 0 0 0 0  Comment     [place] Telephone Survey: data to providers prior to load  Number falls in past yr: 0 0  0   Injury with Fall? 0 0  0   Risk for fall due to : Impaired balance/gait No Fall Risks     Follow up Falls evaluation completed Falls evaluation completed       [place]:  Any stairs in or around the home? {YES/NO:21197} If so, are there any without handrails? {YES/NO:21197} Home free of loose throw rugs in walkways, pet beds, electrical cords, etc? {YES/NO:21197} Adequate lighting in your home to reduce risk  of falls? {YES/NO:21197}  ASSISTIVE DEVICES UTILIZED TO PREVENT FALLS:  Life alert? {YES/NO:21197} Use of a cane, walker or w/c? {YES/NO:21197} Grab bars in the bathroom? {YES/NO:21197} Shower chair or bench in shower? {YES/NO:21197} Elevated toilet seat or a handicapped toilet? {YES/NO:21197}  TIMED UP AND GO:  Was the test performed? {YES/NO:21197}.  Length of time to ambulate 10 feet: *** sec.   {Appearance of PIRJ:1884166}  Cognitive Function:        Immunizations Immunization History  Administered Date(s) Administered   Fluad Quad(high Dose 65+) 06/24/2020   Influenza Split 10/05/2000, 07/18/2009, 06/20/2010, 07/06/2011, 07/05/2012, 06/22/2013, 06/25/2014, 05/28/2018, 05/31/2019   Influenza Whole 10/05/2000, 07/18/2009, 06/20/2010, 07/05/2012   Influenza, High Dose Seasonal PF 06/23/2016, 06/21/2017, 05/28/2018, 05/31/2019, 06/24/2020   Influenza,inj,Quad PF,6+ Mos 06/25/2014   Influenza-Unspecified 05/28/2018, 06/19/2021, 08/05/2021   PFIZER(Purple Top)SARS-COV-2 Vaccination 10/28/2019, 11/18/2019, 07/24/2020, 02/21/2021   Pfizer Covid-19 Vaccine Bivalent Booster 39yrs & up 08/21/2021   Pneumococcal Conjugate-13 12/01/2013, 06/10/2016   Pneumococcal Polysaccharide-23 07/06/2007, 06/23/2016    Pneumococcal-Unspecified 08/05/2021   Td 10/05/2002   Tdap 05/06/2012   Zoster Recombinat (Shingrix) 06/28/2018, 10/12/2018   Zoster, Live 03/27/2009   Zoster, Unspecified 06/28/2018, 10/12/2018    TDAP status: Up to date  Flu Vaccine status: Up to date  Pneumococcal vaccine status: Up to date  Covid-19 vaccine status: Completed vaccines  Qualifies for Shingles Vaccine? Yes   Zostavax completed No   Shingrix Completed?: Yes  Screening Tests Health Maintenance  Topic Date Due   INFLUENZA VACCINE  05/05/2022   TETANUS/TDAP  05/06/2022   COLONOSCOPY (Pts 45-94yrs Insurance coverage will need to be confirmed)  01/29/2031   Pneumonia Vaccine 13+ Years old  Completed   COVID-19 Vaccine  Completed   Hepatitis C Screening  Completed   Zoster Vaccines- Shingrix  Completed   HPV VACCINES  Aged Out    Health Maintenance  There are no preventive care reminders to display for this patient.  Colorectal cancer screening: Type of screening: Colonoscopy. Completed 01/28/21. Repeat every 10 years  Lung Cancer Screening: (Low Dose CT Chest recommended if Age 83-80 years, 30 pack-year currently smoking OR have quit w/in 15years.) {DOES NOT does:27190::"does not"} qualify.   Lung Cancer Screening Referral: ***  Additional Screening:  Hepatitis C Screening: does qualify; Completed 05/19/13  Vision Screening: Recommended annual ophthalmology exams for early detection of glaucoma and other disorders of the eye. Is the patient up to date with their annual eye exam?  {YES/NO:21197} Who is the provider or what is the name of the office in which the patient attends annual eye exams? *** If pt is not established with a provider, would they like to be referred to a provider to establish care? {YES/NO:21197}.   Dental Screening: Recommended annual dental exams for proper oral hygiene  Community Resource Referral / Chronic Care Management: CRR required this visit?  {YES/NO:21197}  CCM required  this visit?  {YES/NO:21197}     Plan:     I have personally reviewed and noted the following in the patient's chart:   Medical and social history Use of alcohol, tobacco or illicit drugs  Current medications and supplements including opioid prescriptions. {Opioid Prescriptions:[phone]} Functional ability and status Nutritional status Physical activity Advanced directives List of other physicians Hospitalizations, surgeries, and ER visits in previous 12 months Vitals Screenings to include cognitive, depression, and falls Referrals and appointments  In addition, I have reviewed and discussed with patient certain preventive protocols, quality metrics, and best practice recommendations. A written personalized care plan  for preventive services as well as general preventive health recommendations were provided to patient.     Duard Brady Tanya Crothers, [place]   03/17/2022   Nurse Notes: ***

## 2022-03-18 ENCOUNTER — Ambulatory Visit (INDEPENDENT_AMBULATORY_CARE_PROVIDER_SITE_OTHER): Payer: Medicare Other

## 2022-03-18 ENCOUNTER — Encounter: Payer: Self-pay | Admitting: Urology

## 2022-03-18 VITALS — BP 143/85 | HR 87 | Temp 98.0°F | Resp 16 | Ht 64.0 in | Wt 173.6 lb

## 2022-03-18 DIAGNOSIS — Z0000 Encounter for general adult medical examination without abnormal findings: Secondary | ICD-10-CM | POA: Diagnosis not present

## 2022-03-18 NOTE — Patient Instructions (Signed)
[name] , Thank you for taking time to come for your Medicare Wellness Visit. I appreciate your ongoing commitment to your health goals. Please review the following plan we discussed and let me know if I can assist you in the future.   Screening recommendations/referrals: Colonoscopy: 01/28/21 due 01/29/31 Recommended yearly ophthalmology/optometry visit for glaucoma screening and checkup Recommended yearly dental visit for hygiene and checkup  Vaccinations: Influenza vaccine: up to date Pneumococcal vaccine: up to date Tdap vaccine: up to date Shingles vaccine: up to date   Covid-19: completed  Advanced directives: yes, on file  Conditions/risks identified: see problem list  Next appointment: Follow up in one year for your annual wellness visit.   Preventive Care 49 Years and Older, Male Preventive care refers to lifestyle choices and visits with your health care provider that can promote health and wellness. What does preventive care include? A yearly physical exam. This is also called an annual well check. Dental exams once or twice a year. Routine eye exams. Ask your health care provider how often you should have your eyes checked. Personal lifestyle choices, including: Daily care of your teeth and gums. Regular physical activity. Eating a healthy diet. Avoiding tobacco and drug use. Limiting alcohol use. Practicing safe sex. Taking low doses of aspirin every day. Taking vitamin and mineral supplements as recommended by your health care provider. What happens during an annual well check? The services and screenings done by your health care provider during your annual well check will depend on your age, overall health, lifestyle risk factors, and family history of disease. Counseling  Your health care provider may ask you questions about your: Alcohol use. Tobacco use. Drug use. Emotional well-being. Home and relationship well-being. Sexual activity. Eating  habits. History of falls. Memory and ability to understand (cognition). Work and work Statistician. Screening  You may have the following tests or measurements: Height, weight, and BMI. Blood pressure. Lipid and cholesterol levels. These may be checked every 5 years, or more frequently if you are over 77 years old. Skin check. Lung cancer screening. You may have this screening every year starting at age 34 if you have a 30-pack-year history of smoking and currently smoke or have quit within the past 15 years. Fecal occult blood test (FOBT) of the stool. You may have this test every year starting at age 39. Flexible sigmoidoscopy or colonoscopy. You may have a sigmoidoscopy every 5 years or a colonoscopy every 10 years starting at age 74. Prostate cancer screening. Recommendations will vary depending on your family history and other risks. Hepatitis C blood test. Hepatitis B blood test. Sexually transmitted disease (STD) testing. Diabetes screening. This is done by checking your blood sugar (glucose) after you have not eaten for a while (fasting). You may have this done every 1-3 years. Abdominal aortic aneurysm (AAA) screening. You may need this if you are a current or former smoker. Osteoporosis. You may be screened starting at age 13 if you are at high risk. Talk with your health care provider about your test results, treatment options, and if necessary, the need for more tests. Vaccines  Your health care provider may recommend certain vaccines, such as: Influenza vaccine. This is recommended every year. Tetanus, diphtheria, and acellular pertussis (Tdap, Td) vaccine. You may need a Td booster every 10 years. Zoster vaccine. You may need this after age 67. Pneumococcal 13-valent conjugate (PCV13) vaccine. One dose is recommended after age 85. Pneumococcal polysaccharide (PPSV23) vaccine. One dose is recommended after  age 64. Talk to your health care provider about which screenings and  vaccines you need and how often you need them. This information is not intended to replace advice given to you by your health care provider. Make sure you discuss any questions you have with your health care provider. Document Released: 10/18/2015 Document Revised: 06/10/2016 Document Reviewed: 07/23/2015 Elsevier Interactive Patient Education  2017 [employer] Prevention in the Home Falls can cause injuries. They can happen to people of all ages. There are many things you can do to make your home safe and to help prevent falls. What can I do on the outside of my home? Regularly fix the edges of walkways and driveways and fix any cracks. Remove anything that might make you trip as you walk through a door, such as a raised step or threshold. Trim any bushes or trees on the path to your home. Use bright outdoor lighting. Clear any walking paths of anything that might make someone trip, such as rocks or tools. Regularly check to see if handrails are loose or broken. Make sure that both sides of any steps have handrails. Any raised decks and porches should have guardrails on the edges. Have any leaves, snow, or ice cleared regularly. Use sand or salt on walking paths during winter. Clean up any spills in your garage right away. This includes oil or grease spills. What can I do in the bathroom? Use night lights. Install grab bars by the toilet and in the tub and shower. Do not use towel bars as grab bars. Use non-skid mats or decals in the tub or shower. If you need to sit down in the shower, use a plastic, non-slip stool. Keep the floor dry. Clean up any water that spills on the floor as soon as it happens. Remove soap buildup in the tub or shower regularly. Attach bath mats securely with double-sided non-slip rug tape. Do not have throw rugs and other things on the floor that can make you trip. What can I do in the bedroom? Use night lights. Make sure that you have a light by your  bed that is easy to reach. Do not use any sheets or blankets that are too big for your bed. They should not hang down onto the floor. Have a firm chair that has side arms. You can use this for support while you get dressed. Do not have throw rugs and other things on the floor that can make you trip. What can I do in the kitchen? Clean up any spills right away. Avoid walking on wet floors. Keep items that you use a lot in easy-to-reach places. If you need to reach something above you, use a strong step stool that has a grab bar. Keep electrical cords out of the way. Do not use floor polish or wax that makes floors slippery. If you must use wax, use non-skid floor wax. Do not have throw rugs and other things on the floor that can make you trip. What can I do with my stairs? Do not leave any items on the stairs. Make sure that there are handrails on both sides of the stairs and use them. Fix handrails that are broken or loose. Make sure that handrails are as long as the stairways. Check any carpeting to make sure that it is firmly attached to the stairs. Fix any carpet that is loose or worn. Avoid having throw rugs at the top or bottom of the stairs. If you do  have throw rugs, attach them to the floor with carpet tape. Make sure that you have a light switch at the top of the stairs and the bottom of the stairs. If you do not have them, ask someone to add them for you. What else can I do to help prevent falls? Wear shoes that: Do not have high heels. Have rubber bottoms. Are comfortable and fit you well. Are closed at the toe. Do not wear sandals. If you use a stepladder: Make sure that it is fully opened. Do not climb a closed stepladder. Make sure that both sides of the stepladder are locked into place. Ask someone to hold it for you, if possible. Clearly mark and make sure that you can see: Any grab bars or handrails. First and last steps. Where the edge of each step is. Use tools that  help you move around (mobility aids) if they are needed. These include: Canes. Walkers. Scooters. Crutches. Turn on the lights when you go into a dark area. Replace any light bulbs as soon as they burn out. Set up your furniture so you have a clear path. Avoid moving your furniture around. If any of your floors are uneven, fix them. If there are any pets around you, be aware of where they are. Review your medicines with your doctor. Some medicines can make you feel dizzy. This can increase your chance of falling. Ask your doctor what other things that you can do to help prevent falls. This information is not intended to replace advice given to you by your health care provider. Make sure you discuss any questions you have with your health care provider. Document Released: 07/18/2009 Document Revised: 02/27/2016 Document Reviewed: 10/26/2014 Elsevier Interactive Patient Education  2017 [employer].

## 2022-03-18 NOTE — Progress Notes (Signed)
Telephone appointment. I verified patient's identity and began nursing interview. Patient reports dysuria and LT hip pain 8/10. No other issues reported at this time.  Meaningful use complete. I-PSS score of 0. Flomax as directed. Urology appointment- November, 2023- per patient.  Reminded patient of his 9:00am-03/19/22 telephone appointment w/ Ashlyn Bruning PA-C. I left my extension [phone] in case patient needs anything. Patient verbalized understanding.  Patient contact [phone]

## 2022-03-19 ENCOUNTER — Ambulatory Visit
Admission: RE | Admit: 2022-03-19 | Discharge: 2022-03-19 | Disposition: A | Discharge status: Home / Self Care | Payer: Medicare Other | Source: Ambulatory Visit | Attending: Urology | Admitting: Urology

## 2022-03-19 DIAGNOSIS — C61 Malignant neoplasm of prostate: Secondary | ICD-10-CM

## 2022-03-19 NOTE — Progress Notes (Signed)
  Radiation Oncology         [phone] ________________________________  Name: Ryan [name] MRN: [id number]  Date: 02/18/2022  DOB: 08-Feb-1946  End of Treatment Note  Diagnosis:   76 y.o. 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 The prostate only was boosted to 75.6 Gy with 18 additional fractions of 2.0 Gy   Beams/energy:  1. The prostate, seminal vesicles, and pelvic lymph nodes were initially treated using VMAT intensity modulated radiotherapy delivering 6 megavolt photons. Image guidance was performed with CB-CT studies prior to each fraction. He was immobilized with a body fix lower extremity mold.  2. the prostate only was boosted using VMAT intensity modulated radiotherapy delivering 6 megavolt photons. Image guidance was performed with CB-CT studies prior to each fraction. He was immobilized with a body fix lower extremity mold.  Narrative: The patient tolerated radiation treatment relatively well with some minor urinary irritation and modest fatigue.  He did develop severe colitis after his 22nd fraction, requiring hospitalization, so the decision was made to discontinue treating the pelvic nodes and just continue with the prostate only boost which he tolerated much better.  Plan: The patient has completed radiation treatment. He will return to radiation oncology clinic for routine followup in one month. I advised him to call or return sooner if he has any questions or concerns related to his recovery or treatment. ________________________________  Sheral Apley. Tammi Klippel, M.D.

## 2022-03-20 ENCOUNTER — Other Ambulatory Visit: Payer: Self-pay | Admitting: Urology

## 2022-03-20 DIAGNOSIS — C61 Malignant neoplasm of prostate: Secondary | ICD-10-CM

## 2022-03-20 DIAGNOSIS — M5459 Other low back pain: Secondary | ICD-10-CM | POA: Diagnosis not present

## 2022-03-20 DIAGNOSIS — M25552 Pain in left hip: Secondary | ICD-10-CM | POA: Diagnosis not present

## 2022-03-20 DIAGNOSIS — Z96651 Presence of right artificial knee joint: Secondary | ICD-10-CM | POA: Diagnosis not present

## 2022-03-20 NOTE — Progress Notes (Signed)
Radiation Oncology         [phone] ________________________________  Name: Ryan [name] MRN: [id number]  Date: 03/19/2022  DOB: 30-Jan-1946  Post Treatment Note  CC: [name], MD  [name], MD  Diagnosis:   76 y.o. 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 The prostate only was boosted to 75.6 Gy with 18 additional fractions of 2.0 Gy   Narrative:  I spoke with the patient to conduct his routine scheduled 1 month follow up visit via telephone to spare the patient unnecessary potential exposure in the healthcare setting during the current COVID-19 pandemic.  The patient was notified in advance and gave permission to proceed with this visit format.  He tolerated radiation treatment relatively well with some minor urinary irritation and modest fatigue.  He did develop severe colitis after his 22nd fraction, requiring hospitalization, so the decision was made to discontinue treating the pelvic nodes and just continue with the prostate only boost which he tolerated much better.                              On review of systems, the patient states that he is doing very well in general.  He had a follow-up visit with Dr. Diona Fanti on 02/25/2022 for his 75-month Lupron injection and his PSA was undetectable which he is thrilled with.  He is continue taking Flomax twice daily and feels like the mild dysuria at the end of his stream is gradually improving.  He specifically denies gross hematuria, straining to void, incomplete bladder emptying or incontinence.  He reports a healthy appetite and is maintaining his weight.  He denies abdominal pain, nausea, vomiting, diarrhea or  constipation.  Fortunately, he has fully recovered from the radiation colitis.  He continues to tolerate the ADT fairly well despite hot flashes and overall, is pleased with his progress to date.  ALLERGIES:  is allergic to albumin (human), polymyxin b-trimethoprim, pseudoephedrine, codeine, guaiacol, statins, gabapentin, meloxicam, peppermint flavor, pseudoephedrine-guaifenesin, rosuvastatin calcium, tapentadol, ciprofloxacin, moxifloxacin, and rofecoxib.  Meds: Current Outpatient Medications  Medication Sig Dispense Refill   apixaban (ELIQUIS) 5 MG TABS tablet Take 1 tablet (5 mg total) by mouth 2 (two) times daily. 180 tablet 3   aspirin EC 81 MG EC tablet Take 1 tablet (81 mg total) by mouth daily. Swallow whole. 30 tablet 11   brimonidine (ALPHAGAN) 0.2 % ophthalmic solution Place 1 drop into the left eye in the morning and at bedtime.   7   budesonide (ENTOCORT EC) 3 MG 24 hr capsule Take 3 capsules (9 mg total) by mouth daily. Take 3 capsules (9 mg total) by mouth once daily 270 capsule 0   cholecalciferol (VITAMIN D3) 25 MCG (1000 UNIT) tablet Take 10,000 mcg by mouth 4 (four) times a week.     diazepam (VALIUM) 5 MG tablet Take 0.5-2 tablets (2.5-10 mg total) by mouth every 12 (twelve) hours as needed for anxiety. (Patient taking differently: Take 2.5-10 mg by mouth as needed for anxiety.) 30 tablet 1   Evolocumab (REPATHA SURECLICK) 258 MG/ML SOAJ Inject 140 mg into the skin  every 14 (fourteen) days. 6 mL 3   furosemide (LASIX) 20 MG tablet TAKE 1 TABLET BY MOUTH DAILY AS  NEEDED 90 tablet 3   latanoprost (XALATAN) 0.005 % ophthalmic solution Place 1 drop into the left eye at bedtime.      levalbuterol (XOPENEX HFA) 45 MCG/ACT inhaler INHALE 2 PUFFS BY MOUTH INTO THE LUNGS EVERY 4 HOURS AS NEEDED FOR WHEEZING 45 g 2   MAGnesium-Oxide 400 (240 Mg) MG tablet Take 1 tablet (400 mg total) by mouth 3 (three) times daily. (Patient taking differently: Take 400 mg by mouth 2 (two) times daily.)  270 tablet 1   metoprolol tartrate 75 MG TABS Take 75 mg by mouth 2 (two) times daily. 60 tablet 3   Multiple Vitamins-Minerals (PRESERVISION AREDS 2) CAPS Take 1 tablet by mouth daily.     nitroGLYCERIN (NITROSTAT) 0.4 MG SL tablet Place 1 tablet (0.4 mg total) under the tongue every 5 (five) minutes as needed for chest pain. 30 tablet 0   omeprazole (PRILOSEC OTC) 20 MG tablet Take 20 mg by mouth daily.     Ranibizumab (LUCENTIS IO) Inject 1 Dose into the eye every 6 (six) weeks. Bilateral eye by Dr Gerarda Fraction     tamsulosin (FLOMAX) 0.4 MG CAPS capsule Take 0.4 mg by mouth 2 (two) times daily.     No current facility-administered medications for this encounter.    Physical Findings:  vitals were not taken for this visit.  Pain Assessment Pain Score: 3  (Dysuria)/10 Unable to assess due to telephone follow-up visit format.  Lab Findings: Lab Results  Component Value Date   WBC 8.0 01/21/2022   HGB 10.0 (L) 01/21/2022   HCT 30.4 (L) 01/21/2022   MCV 88.9 01/21/2022   PLT 304 01/21/2022     Radiographic Findings: No results found.  Impression/Plan: 1. 76 y.o. 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 He will continue to follow up with urology for ongoing PSA determinations and has an appointment scheduled with Dr. Diona Fanti in November 2023. He understands what to expect with regards to PSA monitoring going forward. I will look forward to following his response to treatment via correspondence with urology, and would be happy to continue to participate in his care if clinically indicated. I talked to the patient about what to expect in the future, including his risk for erectile dysfunction and rectal bleeding. I encouraged him to call or return to the office if he has any questions regarding his previous radiation or possible radiation side effects. He was comfortable with this plan and will follow up as needed.        Nicholos Johns, PA-C

## 2022-03-23 ENCOUNTER — Ambulatory Visit: Payer: Medicare Other | Admitting: Internal Medicine

## 2022-03-26 ENCOUNTER — Ambulatory Visit: Payer: Self-pay | Admitting: Urology

## 2022-03-30 ENCOUNTER — Telehealth: Payer: Self-pay

## 2022-03-31 DIAGNOSIS — M25552 Pain in left hip: Secondary | ICD-10-CM | POA: Diagnosis not present

## 2022-04-02 DIAGNOSIS — H353231 Exudative age-related macular degeneration, bilateral, with active choroidal neovascularization: Secondary | ICD-10-CM | POA: Diagnosis not present

## 2022-04-02 DIAGNOSIS — H26493 Other secondary cataract, bilateral: Secondary | ICD-10-CM | POA: Diagnosis not present

## 2022-04-03 ENCOUNTER — Ambulatory Visit: Payer: Medicare Other | Admitting: Internal Medicine

## 2022-04-03 MED ORDER — METOPROLOL TARTRATE 100 MG PO TABS: 100.0000 mg | ORAL_TABLET | Freq: Two times a day (BID) | ORAL | 3 refills | Status: DC

## 2022-04-03 NOTE — Telephone Encounter (Signed)
Outreach made to Pt.  Advised of HM results.  Advised per Dr. Michaele Offer metoprolol tartrate to 100 mg PO BID.  Pt indicates understanding.  6 mo recall placed.

## 2022-04-14 ENCOUNTER — Encounter: Payer: Self-pay | Admitting: *Deleted

## 2022-04-20 ENCOUNTER — Encounter: Payer: Self-pay | Admitting: *Deleted

## 2022-04-21 ENCOUNTER — Encounter: Payer: Self-pay | Admitting: *Deleted

## 2022-04-21 ENCOUNTER — Inpatient Hospital Stay: Payer: Medicare Other | Attending: Adult Health | Admitting: *Deleted

## 2022-04-21 DIAGNOSIS — C61 Malignant neoplasm of prostate: Secondary | ICD-10-CM

## 2022-04-21 NOTE — Progress Notes (Signed)
2 Identifiers were used for verification purposes only. No vitals taken as this was a telephone visit. 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 He rates pain a 5/10. He is under the care of Dr. Wynelle Link for this condition.Pt says he does have some fatigue and decreased stamina/ libido from hormone therapy. He is tolerating the hot flashes by keeping his house cooler. He says, he has noticed some weight gain and gynecomastia to breast tissue since taking ADT. Pt is not able to exercise because of lumbar issues and have not been cleared by Dr. Wynelle Link as of yet. Pt will see PCP again in 3 mos.. Last colonoscopy was 01/2021. Skin check due this fall.. Pt says he still has urinary frequency and a strong urine stream. Bowels have returned to normal. Vaccine reviewed and updated. SCP reviewed and completed.

## 2022-04-23 ENCOUNTER — Other Ambulatory Visit (HOSPITAL_COMMUNITY): Payer: Self-pay

## 2022-05-05 DIAGNOSIS — M5416 Radiculopathy, lumbar region: Secondary | ICD-10-CM | POA: Diagnosis not present

## 2022-05-14 DIAGNOSIS — H353231 Exudative age-related macular degeneration, bilateral, with active choroidal neovascularization: Secondary | ICD-10-CM | POA: Diagnosis not present

## 2022-05-14 DIAGNOSIS — H26493 Other secondary cataract, bilateral: Secondary | ICD-10-CM | POA: Diagnosis not present

## 2022-05-20 ENCOUNTER — Other Ambulatory Visit: Payer: Self-pay | Admitting: *Deleted

## 2022-05-20 DIAGNOSIS — R790 Abnormal level of blood mineral: Secondary | ICD-10-CM

## 2022-05-20 MED ORDER — MAGNESIUM OXIDE -MG SUPPLEMENT 400 (240 MG) MG PO TABS: 400.0000 mg | ORAL_TABLET | Freq: Three times a day (TID) | ORAL | 1 refills | Status: DC

## 2022-05-21 ENCOUNTER — Ambulatory Visit (INDEPENDENT_AMBULATORY_CARE_PROVIDER_SITE_OTHER): Payer: Medicare Other | Admitting: Family Medicine

## 2022-05-21 VITALS — BP 130/82 | HR 74 | Temp 98.4°F | Resp 16 | Ht 64.0 in | Wt 176.0 lb

## 2022-05-21 DIAGNOSIS — E559 Vitamin D deficiency, unspecified: Secondary | ICD-10-CM | POA: Diagnosis not present

## 2022-05-21 DIAGNOSIS — R739 Hyperglycemia, unspecified: Secondary | ICD-10-CM | POA: Diagnosis not present

## 2022-05-21 DIAGNOSIS — E782 Mixed hyperlipidemia: Secondary | ICD-10-CM

## 2022-05-21 DIAGNOSIS — I4819 Other persistent atrial fibrillation: Secondary | ICD-10-CM | POA: Diagnosis not present

## 2022-05-21 DIAGNOSIS — C61 Malignant neoplasm of prostate: Secondary | ICD-10-CM | POA: Diagnosis not present

## 2022-05-21 DIAGNOSIS — Z789 Other specified health status: Secondary | ICD-10-CM

## 2022-05-21 DIAGNOSIS — I10 Essential (primary) hypertension: Secondary | ICD-10-CM | POA: Diagnosis not present

## 2022-05-21 DIAGNOSIS — I480 Paroxysmal atrial fibrillation: Secondary | ICD-10-CM | POA: Diagnosis not present

## 2022-05-21 DIAGNOSIS — I2510 Atherosclerotic heart disease of native coronary artery without angina pectoris: Secondary | ICD-10-CM

## 2022-05-21 NOTE — Progress Notes (Signed)
Subjective:   By signing my name below, I, Kellie Simmering, attest that this documentation has been prepared under the direction and in the presence of [name], MD 05/21/2022.     Patient ID: Ryan [name], male    DOB: 1945-12-15, 76 y.o.   MRN: [id number]  Chief Complaint  Patient presents with   Follow-up    Here for follow up    HPI Patient is in today for an office visit. His wife is also speaking on his behalf.   Exercise: He reports that he has been remaining active and walking using a cane.  Prostate cancer: He currently has stage 4 prostate cancer.   Blood pressure: He states that he is currently taking Aspirin 81 mc, Eliquis 5 mg and Metoprolol Tartrate 100 mg to manage his blood pressure. His blood pressure is within normal range today.  BP Readings from Last 3 Encounters:  05/21/22 130/82  03/18/22 (!) 143/85  02/24/22 (!) 122/58   Pulse Readings from Last 3 Encounters:  05/21/22 74  03/18/22 87  02/24/22 85   Immunizations: He is due for his Tetanus immunization. He states that he intends on receiving COVID-19 and Flu vaccinations in the fall. He has been informed about receiving the RSV immunization.   Levalbuterol: He reports that he found Levalbuterol more efficient than Albuterol but does not longer use it.   Chest discomfort: He reports having pain in his chest that radiates to his right leg. He states that the pain diminishes quickly. His wife states that he has been having hot flashes.   Hormone: He reports that he has been taking hormones which have causes him to have hot flashes, his penis to shrink, and  breast tissue to thicken.   Budesonide: He reports that he is currently taking Budesonide 3 mg to manage his bowels.  Past Medical History:  Diagnosis Date   Anemia    Anticoagulant long-term use    eliquis--- mananged by cardiology   BPH with urinary obstruction    CAD (coronary artery disease)    cardiologist--- dr g. taylor--- cath  04-22-2011  moderate LM stenosis, borderline sig pRCA, sig stenosis mid to distal LAD ;  aggressive medical therapy;   cath 07-21-2021 for chest pain w/ ST changes,  severe disease;  s/p CABG x4 07-22-2021   Chronic diastolic CHF (congestive heart failure) ([place]) 03/2018   followed by cardiology   CKD (chronic kidney disease), stage III ([place])    COPD (chronic obstructive pulmonary disease) ([place])    followed by pcp   DOE (dyspnea on exertion)    per pt when walk >100yds,  per pt rides outside bike 4-8 miles daily without sob,  household chores and yard work without sob   Eczema    GERD (gastroesophageal reflux disease)    Glaucoma, left eye    Heart murmur    Hemorrhoids    History of adenomatous polyp of colon    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 Bryan Lemma--- dx by biopsy 01-28-2021   Macular degeneration of both eyes    followed by dr c. Cordelia Pen--- bilateral eye injection every 6 wks   Malignant neoplasm prostate [facility] 07/2021   urologist-- dr Diona Fanti;  dx 10/ 2022,  Gleason 5+5, PSA 1.8   Osteoporosis 05/31/2016   PAF (paroxysmal atrial fibrillation) ([place]) 03/2012   cardiologist-- dr g. taylor;  first dx 06/ 2013 AFlutter w/ RVR   Vitamin D deficiency    Past Surgical History:  Procedure Laterality Date   ANTERIOR CERVICAL DECOMP/DISCECTOMY FUSION  06/08/2002   '@[facility]' ;  C5--C7   [place]   04/22/2011   moderate left main and RCA stenosis not significant by FFR and IVUS on medical therapy   CARDIOVERSION N/A 04/25/2018   Procedure: CARDIOVERSION;  Surgeon: Skeet Latch, MD;  Location: [facility];  Service: Cardiovascular;  Laterality: N/A;   CATARACT EXTRACTION W/ INTRAOCULAR LENS IMPLANT Bilateral 2021   COLONOSCOPY WITH ESOPHAGOGASTRODUODENOSCOPY (EGD)  01/28/2021   by [email or web address]   CORONARY ARTERY BYPASS GRAFT N/A 07/22/2021   Procedure: 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, ON PUMP, USING LEFT INTERNAL MAMMARY ARTERY AND ENDOSCOPICALLY HARVESTED RIGHT GREATER SAPHENOUS VEIN;  Surgeon: Melrose Nakayama, MD;  Location: [facility];  Service: Open Heart Surgery;  Laterality: N/A;   ENDOVEIN HARVEST OF GREATER SAPHENOUS VEIN  07/22/2021   Procedure: ENDOVEIN HARVEST OF GREATER SAPHENOUS VEIN;  Surgeon: Melrose Nakayama, MD;  Location: [facility];  Service: Open Heart Surgery;;   EXTRACORPOREAL SHOCK WAVE LITHOTRIPSY     x2  1990s   FOOT SURGERY Right 1970   calcification removed from top of foot   GOLD SEED IMPLANT N/A 11/28/2021   Procedure: GOLD SEED IMPLANT;  Surgeon: Festus Aloe, MD;  Location: [facility];  Service: Urology;  Laterality: N/A;   KNEE ARTHROSCOPY Right 1991   LAPAROSCOPIC CHOLECYSTECTOMY  2000   LEFT HEART CATH AND CORONARY ANGIOGRAPHY N/A 07/21/2021   Procedure: LEFT HEART CATH AND CORONARY ANGIOGRAPHY;  Surgeon: Lorretta Harp, MD;  Location: [place];  Service: Cardiovascular;  Laterality: N/A;   SPACE OAR INSTILLATION N/A 11/28/2021   Procedure: SPACE OAR INSTILLATION;  Surgeon: Festus Aloe, MD;  Location[facility];  Service: Urology;  Laterality: N/A;   TEE WITHOUT CARDIOVERSION N/A 07/22/2021   Procedure: TRANSESOPHAGEAL ECHOCARDIOGRAM (TEE);  Surgeon: Melrose Nakayama, MD;  Location: [facility];  Service: Open Heart Surgery;  Laterality: N/A;   TOTAL KNEE ARTHROPLASTY Right 08/14/2009    '@[facility]'    TRANSURETHRAL RESECTION OF PROSTATE N/A 07/17/2021   Procedure: TRANSURETHRAL RESECTION OF THE PROSTATE (TURP);  Surgeon: [name], MD;  Location: [facility];  Service: Urology;  Laterality: N/A;  1 HR   Family History  Problem Relation Age of Onset   Heart disease Mother    Diabetes Mother    Cirrhosis Mother    Emphysema Mother        never smoked but 2nd hand through her spouse   Hypertension Mother    Macular degeneration Mother    Heart disease Father    Cancer Father        prostate   Hyperlipidemia Father    Hypertension Father    Varicose Veins Father  Heart attack Father    Peripheral vascular disease Father    Heart disease Sister    Arthritis Sister    Hyperlipidemia Sister    Obesity Sister    Macular degeneration Sister    Heart disease Brother        5 stents   Hyperlipidemia Brother    Macular degeneration Maternal Grandfather    Cirrhosis Sister    Obesity Sister    Arthritis Sister    Heart disease Sister    Obesity Sister    Liver disease Other    Prostate cancer Other    Coronary artery disease Other    Colon cancer Neg Hx    Esophageal cancer Neg Hx    Rectal cancer Neg Hx    Stomach cancer Neg Hx    Social History   Socioeconomic History   Marital status: Married    Spouse name: Thayer Headings   Number of children: 0   Years of education: Not on file   Highest education level: Not on file  Occupational History   Occupation: retired    Fish farm manager: RETIRED  Tobacco Use   Smoking status: Former    Packs/day: 0.50    Years: 50.00    Total pack years: 25.00    Types: Cigarettes    Quit date: 05/01/2009    Years since quitting: 13.0   Smokeless tobacco: Former    Types: Chew    Quit date: 1995   Tobacco comments:    Former smoker 01/27/22  Vaping Use   Vaping Use: Never used  Substance and Sexual Activity   Alcohol use: Yes    Alcohol/week: 2.0 standard drinks of alcohol    Types: 2 Cans of beer per week     Comment: 1 beer daily 2 days a week 01/27/22   Drug use: Never   Sexual activity: Yes    Comment: lives with wife, no dietary restrictions  Other Topics Concern   Not on file  Social History Narrative   Lives with wife   Social Determinants of Health   Financial Resource Strain: Low Risk  (03/18/2022)   Overall Financial Resource Strain (CARDIA)    Difficulty of Paying Living Expenses: Not hard at all  Food Insecurity: No Food Insecurity (03/18/2022)   Hunger Vital Sign    Worried About Running Out of Food in the Last Year: Never true    [place] in the Last Year: Never true  Transportation Needs: No Transportation Needs (03/18/2022)   PRAPARE - Hydrologist (Medical): No    Lack of Transportation (Non-Medical): No  Physical Activity: Sufficiently Active (03/18/2022)   Exercise Vital Sign    Days of Exercise per Week: 7 days    Minutes of Exercise per Session: 30 min  Stress: No Stress Concern Present (03/18/2022)   [place]    Feeling of Stress : Not at all  Social Connections: Moderately Integrated (03/18/2022)   Social Connection and Isolation Panel [NHANES]    Frequency of Communication with Friends and Family: More than three times a week    Frequency of Social Gatherings with Friends and Family: More than three times a week    Attends Religious Services: Never    Marine scientist or Organizations: Yes    Attends Music therapist: More than 4 times per year    Marital Status: Married  Human resources officer Violence: Not At Risk (  03/18/2022)   Humiliation, Afraid, Rape, and Kick questionnaire    Fear of Current or Ex-Partner: No    Emotionally Abused: No    Physically Abused: No    Sexually Abused: No   Outpatient Medications Prior to Visit  Medication Sig Dispense Refill   apixaban (ELIQUIS) 5 MG TABS tablet Take 1 tablet (5 mg total) by mouth 2 (two)  times daily. 180 tablet 3   aspirin EC 81 MG EC tablet Take 1 tablet (81 mg total) by mouth daily. Swallow whole. 30 tablet 11   brimonidine (ALPHAGAN) 0.2 % ophthalmic solution Place 1 drop into the left eye in the morning and at bedtime.   7   budesonide (ENTOCORT EC) 3 MG 24 hr capsule Take 9 mg by mouth daily.     cholecalciferol (VITAMIN D3) 25 MCG (1000 UNIT) tablet Take 10,000 mcg by mouth 4 (four) times a week.     diazepam (VALIUM) 5 MG tablet Take 0.5-2 tablets (2.5-10 mg total) by mouth every 12 (twelve) hours as needed for anxiety. 30 tablet 1   Evolocumab (REPATHA SURECLICK) 935 MG/ML SOAJ Inject 140 mg into the skin every 14 (fourteen) days. 6 mL 3   furosemide (LASIX) 20 MG tablet TAKE 1 TABLET BY MOUTH DAILY AS  NEEDED 90 tablet 3   latanoprost (XALATAN) 0.005 % ophthalmic solution Place 1 drop into the left eye at bedtime.      levalbuterol (XOPENEX HFA) 45 MCG/ACT inhaler INHALE 2 PUFFS BY MOUTH INTO THE LUNGS EVERY 4 HOURS AS NEEDED FOR WHEEZING 45 g 2   MAGnesium-Oxide 400 (240 Mg) MG tablet Take 1 tablet (400 mg total) by mouth 3 (three) times daily. 270 tablet 1   metoprolol tartrate (LOPRESSOR) 100 MG tablet Take 1 tablet (100 mg total) by mouth 2 (two) times daily. 180 tablet 3   Multiple Vitamins-Minerals (PRESERVISION AREDS 2) CAPS Take 1 tablet by mouth daily.     nitroGLYCERIN (NITROSTAT) 0.4 MG SL tablet Place 1 tablet (0.4 mg total) under the tongue every 5 (five) minutes as needed for chest pain. 30 tablet 0   omeprazole (PRILOSEC OTC) 20 MG tablet Take 20 mg by mouth daily.     Ranibizumab (LUCENTIS IO) Inject 1 Dose into the eye every 6 (six) weeks. Bilateral eye by Dr Gerarda Fraction     tamsulosin (FLOMAX) 0.4 MG CAPS capsule Take 0.4 mg by mouth 2 (two) times daily.     No facility-administered medications prior to visit.   Allergies  Allergen Reactions   Albumin (Human) Anaphylaxis   Polymyxin B-Trimethoprim Swelling    Eye drops made eyes swell    Pseudoephedrine Other (See Comments)    Stomach cramps   Codeine Hives, Itching and Rash   Guaiacol Other (See Comments)    Hallucinations   Statins Other (See Comments)    Muscle cramps    Gabapentin Other (See Comments)    Pt unsure of sensitivity   Meloxicam Other (See Comments)    Pt unsure of sensitivity   Peppermint Flavor Other (See Comments)    Severe cramping   Pseudoephedrine-Guaifenesin Nausea And Vomiting    Stomach cramps   Rosuvastatin Calcium Other (See Comments)    Muscle aches   Tapentadol Other (See Comments)    Pt unsure of sensitivity   Ciprofloxacin Hives, Itching, Nausea Only and Rash   Moxifloxacin Nausea Only and Other (See Comments)    Headaches, stomach cramps    Rofecoxib Other (See Comments)  Stomach cramping   ROS     Objective:    Physical Exam Constitutional:      General: He is not in acute distress.    Appearance: Normal appearance. He is not ill-appearing.  HENT:     Head: Normocephalic and atraumatic.     Right Ear: External ear normal.     Left Ear: External ear normal.     Mouth/Throat:     Mouth: Mucous membranes are moist.     Pharynx: Oropharynx is clear.  Eyes:     Extraocular Movements: Extraocular movements intact.     Pupils: Pupils are equal, round, and reactive to light.  Neck:     Vascular: No carotid bruit.  Cardiovascular:     Rate and Rhythm: Normal rate and regular rhythm.     Pulses: Normal pulses.     Heart sounds: Normal heart sounds. No murmur heard.    No gallop.  Pulmonary:     Effort: Pulmonary effort is normal. No respiratory distress.     Breath sounds: Normal breath sounds. No wheezing or rales.  Abdominal:     General: Bowel sounds are normal.  Lymphadenopathy:     Cervical: No cervical adenopathy.  Skin:    General: Skin is warm and dry.  Neurological:     Mental Status: He is alert and oriented to person, place, and time.  Psychiatric:        Mood and Affect: Mood normal.         Behavior: Behavior normal.        Judgment: Judgment normal.    BP 130/82 (BP Location: Right Arm, Patient Position: Sitting, Cuff Size: Normal)   Pulse 74   Temp 98.4 F (36.9 C) (Oral)   Resp 16   Ht '5\' 4"'  (1.626 m)   Wt 176 lb (79.8 kg)   SpO2 98%   BMI 30.21 kg/m  Wt Readings from Last 3 Encounters:  05/21/22 176 lb (79.8 kg)  03/18/22 173 lb 9.6 oz (78.7 kg)  02/24/22 166 lb (75.3 kg)   Diabetic Foot Exam - Simple   No data filed    Lab Results  Component Value Date   WBC 8.0 01/21/2022   HGB 10.0 (L) 01/21/2022   HCT 30.4 (L) 01/21/2022   PLT 304 01/21/2022   GLUCOSE 105 (H) 01/21/2022   CHOL 121 10/13/2021   TRIG 70 10/13/2021   HDL 56 10/13/2021   LDLDIRECT 134.2 12/01/2013   LDLCALC 51 10/13/2021   ALT 20 01/16/2022   AST 25 01/16/2022   NA 136 01/21/2022   K 4.6 01/21/2022   CL 100 01/21/2022   CREATININE 1.01 01/21/2022   BUN 30 (H) 01/21/2022   CO2 27 01/21/2022   TSH 2.040 01/19/2022   PSA 1.80 03/04/2021   INR 1.4 (H) 07/22/2021   HGBA1C 5.0 07/21/2021   Lab Results  Component Value Date   TSH 2.040 01/19/2022   Lab Results  Component Value Date   WBC 8.0 01/21/2022   HGB 10.0 (L) 01/21/2022   HCT 30.4 (L) 01/21/2022   MCV 88.9 01/21/2022   PLT 304 01/21/2022   Lab Results  Component Value Date   NA 136 01/21/2022   K 4.6 01/21/2022   CO2 27 01/21/2022   GLUCOSE 105 (H) 01/21/2022   BUN 30 (H) 01/21/2022   CREATININE 1.01 01/21/2022   BILITOT 0.6 01/16/2022   ALKPHOS 50 01/16/2022   AST 25 01/16/2022   ALT 20 01/16/2022   PROT  6.3 (L) 01/16/2022   ALBUMIN 2.8 (L) 01/16/2022   CALCIUM 9.4 01/21/2022   ANIONGAP 9 01/21/2022   EGFR 57 (L) 08/12/2021   GFR 45.89 (L) 09/23/2021   Lab Results  Component Value Date   CHOL 121 10/13/2021   Lab Results  Component Value Date   HDL 56 10/13/2021   Lab Results  Component Value Date   LDLCALC 51 10/13/2021   Lab Results  Component Value Date   TRIG 70 10/13/2021   Lab  Results  Component Value Date   CHOLHDL 2.2 10/13/2021   Lab Results  Component Value Date   HGBA1C 5.0 07/21/2021      Assessment & Plan:   Problem List Items Addressed This Visit     Hyperlipidemia, mixed    Encourage heart healthy diet such as MIND or DASH diet, increase exercise, avoid trans fats, simple carbohydrates and processed foods, consider a krill or fish or flaxseed oil cap daily. Does not tolerate statins      Essential hypertension    Well controlled, no changes to meds. Encouraged heart healthy diet such as the DASH diet and exercise as tolerated.       Relevant Orders   CBC   Comprehensive metabolic panel   Lipid panel   TSH   Hyperglycemia - Primary    hgba1c acceptable, minimize simple carbs. Increase exercise as tolerated.       Relevant Orders   Lipid panel   Hemoglobin A1c   PAF (paroxysmal atrial fibrillation) ([place])    Following with cardiology, rate controlled. And tolerating Eliquis      Vitamin D deficiency    Supplement and monitor      Statin intolerance    He is tolerating Repatha      Prostate cancer [facility])    He is following with urology and oncology and he is doing much better      Persistent atrial fibrillation ([place])    Rate controlled and tolerating Eliquis       Relevant Orders   Lipid panel   No orders of the defined types were placed in this encounter.  I, Penni Homans, MD, personally preformed the services described in this documentation.  All medical record entries made by the scribe were at my direction and in my presence.  I have reviewed the chart and discharge instructions (if applicable) and agree that the record reflects my personal performance and is accurate and complete. 05/21/2022  I,Mohammed Iqbal,acting as a scribe for Penni Homans, MD.,have documented all relevant documentation on the behalf of Penni Homans, MD,as directed by  Penni Homans, MD while in the presence of Penni Homans, MD.  Penni Homans,  MD

## 2022-05-21 NOTE — Assessment & Plan Note (Signed)
Well controlled, no changes to meds. Encouraged heart healthy diet such as the DASH diet and exercise as tolerated.  °

## 2022-05-21 NOTE — Assessment & Plan Note (Signed)
Supplement and monitor 

## 2022-05-21 NOTE — Assessment & Plan Note (Signed)
Rate controlled and tolerating Eliquis

## 2022-05-21 NOTE — Patient Instructions (Signed)
Tetanus shot at pharmacy  RSV vaccination at the pharmacy  Flu shot in September  New Covid shot late September or early October

## 2022-05-22 ENCOUNTER — Other Ambulatory Visit: Payer: Self-pay

## 2022-05-22 DIAGNOSIS — I10 Essential (primary) hypertension: Secondary | ICD-10-CM

## 2022-05-22 DIAGNOSIS — R739 Hyperglycemia, unspecified: Secondary | ICD-10-CM

## 2022-05-22 NOTE — Assessment & Plan Note (Signed)
hgba1c acceptable, minimize simple carbs. Increase exercise as tolerated.  

## 2022-05-22 NOTE — Assessment & Plan Note (Signed)
He is tolerating Repatha

## 2022-05-22 NOTE — Assessment & Plan Note (Signed)
Following with cardiology, rate controlled. And tolerating Eliquis

## 2022-05-22 NOTE — Assessment & Plan Note (Signed)
He is following with urology and oncology and he is doing much better

## 2022-05-22 NOTE — Assessment & Plan Note (Signed)
Encourage heart healthy diet such as MIND or DASH diet, increase exercise, avoid trans fats, simple carbohydrates and processed foods, consider a krill or fish or flaxseed oil cap daily. Does not tolerate statins

## 2022-05-27 ENCOUNTER — Other Ambulatory Visit (INDEPENDENT_AMBULATORY_CARE_PROVIDER_SITE_OTHER): Payer: Medicare Other

## 2022-05-27 DIAGNOSIS — C61 Malignant neoplasm of prostate: Secondary | ICD-10-CM | POA: Diagnosis not present

## 2022-05-27 DIAGNOSIS — R739 Hyperglycemia, unspecified: Secondary | ICD-10-CM

## 2022-05-27 LAB — HEMOGLOBIN A1C: Hgb A1c MFr Bld: 5.9 % (ref 4.6–6.5)

## 2022-05-29 DIAGNOSIS — Z23 Encounter for immunization: Secondary | ICD-10-CM | POA: Diagnosis not present

## 2022-06-03 DIAGNOSIS — Z8546 Personal history of malignant neoplasm of prostate: Secondary | ICD-10-CM | POA: Diagnosis not present

## 2022-06-03 DIAGNOSIS — Z79899 Other long term (current) drug therapy: Secondary | ICD-10-CM | POA: Diagnosis not present

## 2022-06-05 ENCOUNTER — Ambulatory Visit: Payer: Self-pay

## 2022-06-05 NOTE — Patient Outreach (Signed)
  Care Coordination   Initial Visit Note   06/05/2022 Name: Ryan [name] MRN: [id number] DOB: 10/25/1945  Ryan [name] is a 76 y.o. year old male who sees [name], MD for primary care. I spoke with  [name] by phone today.  What matters to the patients health and wellness today?  Patient denies any care coordination or resource needs. Declines care coordination services at this time.    Goals Addressed             This Visit's Progress    COMPLETED: Care Coordination Activities-no follow up required       Care Coordination Interventions: Discussed Care Coordination program SDOH screening completed         SDOH assessments and interventions completed:  Yes  SDOH Interventions Today    Flowsheet Row Most Recent Value  SDOH Interventions   Food Insecurity Interventions Intervention Not Indicated  Transportation Interventions Intervention Not Indicated        Care Coordination Interventions Activated:  Yes  Care Coordination Interventions:  Yes, provided   Follow up plan: No further intervention required.   Encounter Outcome:  Pt. Visit Completed   Thea Silversmith, RN, MSN, BSN, CCM Care Coordinator [phone]

## 2022-06-17 DIAGNOSIS — M5416 Radiculopathy, lumbar region: Secondary | ICD-10-CM | POA: Diagnosis not present

## 2022-06-17 DIAGNOSIS — S76311D Strain of muscle, fascia and tendon of the posterior muscle group at thigh level, right thigh, subsequent encounter: Secondary | ICD-10-CM | POA: Diagnosis not present

## 2022-06-24 DIAGNOSIS — M5417 Radiculopathy, lumbosacral region: Secondary | ICD-10-CM | POA: Diagnosis not present

## 2022-06-25 DIAGNOSIS — H26493 Other secondary cataract, bilateral: Secondary | ICD-10-CM | POA: Diagnosis not present

## 2022-06-25 DIAGNOSIS — H353231 Exudative age-related macular degeneration, bilateral, with active choroidal neovascularization: Secondary | ICD-10-CM | POA: Diagnosis not present

## 2022-06-26 DIAGNOSIS — M5417 Radiculopathy, lumbosacral region: Secondary | ICD-10-CM | POA: Diagnosis not present

## 2022-07-06 ENCOUNTER — Other Ambulatory Visit: Payer: Self-pay

## 2022-07-06 DIAGNOSIS — M5417 Radiculopathy, lumbosacral region: Secondary | ICD-10-CM | POA: Diagnosis not present

## 2022-07-06 DIAGNOSIS — E782 Mixed hyperlipidemia: Secondary | ICD-10-CM

## 2022-07-06 MED ORDER — REPATHA SURECLICK 140 MG/ML ~~LOC~~ SOAJ: 140.0000 mg | SUBCUTANEOUS | 3 refills | Status: DC

## 2022-07-08 DIAGNOSIS — M5417 Radiculopathy, lumbosacral region: Secondary | ICD-10-CM | POA: Diagnosis not present

## 2022-07-13 DIAGNOSIS — M5417 Radiculopathy, lumbosacral region: Secondary | ICD-10-CM | POA: Diagnosis not present

## 2022-07-15 DIAGNOSIS — M5417 Radiculopathy, lumbosacral region: Secondary | ICD-10-CM | POA: Diagnosis not present

## 2022-07-17 ENCOUNTER — Other Ambulatory Visit: Payer: Self-pay | Admitting: Family Medicine

## 2022-07-17 DIAGNOSIS — R221 Localized swelling, mass and lump, neck: Secondary | ICD-10-CM

## 2022-07-17 DIAGNOSIS — R591 Generalized enlarged lymph nodes: Secondary | ICD-10-CM

## 2022-07-19 DIAGNOSIS — Z23 Encounter for immunization: Secondary | ICD-10-CM | POA: Diagnosis not present

## 2022-07-21 DIAGNOSIS — M5417 Radiculopathy, lumbosacral region: Secondary | ICD-10-CM | POA: Diagnosis not present

## 2022-07-23 DIAGNOSIS — M5417 Radiculopathy, lumbosacral region: Secondary | ICD-10-CM | POA: Diagnosis not present

## 2022-07-31 DIAGNOSIS — M25511 Pain in right shoulder: Secondary | ICD-10-CM | POA: Diagnosis not present

## 2022-07-31 DIAGNOSIS — M7541 Impingement syndrome of right shoulder: Secondary | ICD-10-CM | POA: Diagnosis not present

## 2022-08-03 DIAGNOSIS — M5417 Radiculopathy, lumbosacral region: Secondary | ICD-10-CM | POA: Diagnosis not present

## 2022-08-05 DIAGNOSIS — M5417 Radiculopathy, lumbosacral region: Secondary | ICD-10-CM | POA: Diagnosis not present

## 2022-08-06 DIAGNOSIS — H353231 Exudative age-related macular degeneration, bilateral, with active choroidal neovascularization: Secondary | ICD-10-CM | POA: Diagnosis not present

## 2022-08-06 DIAGNOSIS — H26493 Other secondary cataract, bilateral: Secondary | ICD-10-CM | POA: Diagnosis not present

## 2022-08-07 DIAGNOSIS — Z9842 Cataract extraction status, left eye: Secondary | ICD-10-CM | POA: Diagnosis not present

## 2022-08-07 DIAGNOSIS — Z9841 Cataract extraction status, right eye: Secondary | ICD-10-CM | POA: Diagnosis not present

## 2022-08-11 ENCOUNTER — Other Ambulatory Visit: Payer: Self-pay | Admitting: Family Medicine

## 2022-08-12 DIAGNOSIS — M5417 Radiculopathy, lumbosacral region: Secondary | ICD-10-CM | POA: Diagnosis not present

## 2022-08-13 ENCOUNTER — Telehealth: Payer: Self-pay | Admitting: *Deleted

## 2022-08-13 NOTE — Telephone Encounter (Signed)
PA approved.   Request Reference Number: [id number]. LEVALBUTEROL AER 45/ACT is approved through 10/05/2023. Your patient may now fill this prescription and it will be covered.

## 2022-08-13 NOTE — Telephone Encounter (Signed)
Prior auth started via cover my meds.  Awaiting determination.  Key: [id number]

## 2022-08-14 NOTE — Telephone Encounter (Signed)
Outcome Approved on November 9 Request Reference Number: [id number]. LEVALBUTEROL AER 45/ACT is approved through 10/05/2023. Your patient may now fill this prescription and it will be covered.

## 2022-08-17 DIAGNOSIS — C61 Malignant neoplasm of prostate: Secondary | ICD-10-CM | POA: Diagnosis not present

## 2022-08-18 DIAGNOSIS — M5417 Radiculopathy, lumbosacral region: Secondary | ICD-10-CM | POA: Diagnosis not present

## 2022-08-21 ENCOUNTER — Other Ambulatory Visit: Payer: Self-pay | Admitting: Family Medicine

## 2022-08-21 NOTE — Telephone Encounter (Signed)
Requesting: diazepam 5mg   Contract:08/03/2014 UDS: 03/23/21 Last Visit: 05/21/22 Next Visit: 10/22/22 Last Refill: 01/29/22 #30 and 1RF   Please Advise

## 2022-08-26 DIAGNOSIS — C61 Malignant neoplasm of prostate: Secondary | ICD-10-CM | POA: Diagnosis not present

## 2022-09-09 DIAGNOSIS — M5417 Radiculopathy, lumbosacral region: Secondary | ICD-10-CM | POA: Diagnosis not present

## 2022-09-15 ENCOUNTER — Telehealth: Payer: Self-pay | Admitting: Family Medicine

## 2022-09-15 NOTE — Telephone Encounter (Signed)
Called pt and appt was made

## 2022-09-15 NOTE — Telephone Encounter (Signed)
Accidentally clicked out... here is a continuance of the message.  Patient & wife had appt on 1/18 & I called to cancel. Charlett Blake first appt is in march Patient refused to wait that long & said he needed the following by the beginning of year to do things for PT and could not wait til March.   Anywhere we can get them worked in???  [place]

## 2022-09-15 NOTE — Telephone Encounter (Signed)
Pt needing PT release note for [facility] pt Ryan [name],

## 2022-09-16 DIAGNOSIS — M5417 Radiculopathy, lumbosacral region: Secondary | ICD-10-CM | POA: Diagnosis not present

## 2022-09-17 DIAGNOSIS — H26493 Other secondary cataract, bilateral: Secondary | ICD-10-CM | POA: Diagnosis not present

## 2022-09-17 DIAGNOSIS — H353231 Exudative age-related macular degeneration, bilateral, with active choroidal neovascularization: Secondary | ICD-10-CM | POA: Diagnosis not present

## 2022-09-17 DIAGNOSIS — H401122 Primary open-angle glaucoma, left eye, moderate stage: Secondary | ICD-10-CM | POA: Diagnosis not present

## 2022-09-17 DIAGNOSIS — Z961 Presence of intraocular lens: Secondary | ICD-10-CM | POA: Diagnosis not present

## 2022-09-30 ENCOUNTER — Other Ambulatory Visit: Payer: Self-pay | Admitting: Family Medicine

## 2022-09-30 ENCOUNTER — Telehealth: Payer: Self-pay | Admitting: Family Medicine

## 2022-09-30 MED ORDER — BENZONATATE 200 MG PO CAPS: 200.0000 mg | ORAL_CAPSULE | Freq: Three times a day (TID) | ORAL | 0 refills | Status: DC | PRN

## 2022-09-30 NOTE — Telephone Encounter (Signed)
Pt called requesting a medication be called in for the cold he has with a decongestant. Pt states that Dr. Charlett Blake has done this for him in the past and would like it called into the following pharmacy:  [facility], [place] [facility] [place] a note would be sent back to look into this for him. Pt acknowledged understanding.

## 2022-10-01 MED ORDER — BENZONATATE 200 MG PO CAPS: 200.0000 mg | ORAL_CAPSULE | Freq: Three times a day (TID) | ORAL | 0 refills | Status: DC | PRN

## 2022-10-01 NOTE — Telephone Encounter (Signed)
Tessalon sent by PCP.

## 2022-10-02 ENCOUNTER — Encounter (HOSPITAL_BASED_OUTPATIENT_CLINIC_OR_DEPARTMENT_OTHER): Payer: Self-pay

## 2022-10-02 ENCOUNTER — Emergency Department (HOSPITAL_BASED_OUTPATIENT_CLINIC_OR_DEPARTMENT_OTHER)
Admission: EM | Admit: 2022-10-02 | Discharge: 2022-10-02 | Disposition: A | Discharge status: Home / Self Care | Payer: Medicare Other | Attending: Emergency Medicine | Admitting: Emergency Medicine

## 2022-10-02 ENCOUNTER — Emergency Department (HOSPITAL_BASED_OUTPATIENT_CLINIC_OR_DEPARTMENT_OTHER): Payer: Medicare Other

## 2022-10-02 ENCOUNTER — Other Ambulatory Visit: Payer: Self-pay

## 2022-10-02 DIAGNOSIS — Z7982 Long term (current) use of aspirin: Secondary | ICD-10-CM | POA: Insufficient documentation

## 2022-10-02 DIAGNOSIS — Z7901 Long term (current) use of anticoagulants: Secondary | ICD-10-CM | POA: Insufficient documentation

## 2022-10-02 DIAGNOSIS — R059 Cough, unspecified: Secondary | ICD-10-CM | POA: Diagnosis not present

## 2022-10-02 DIAGNOSIS — I4891 Unspecified atrial fibrillation: Secondary | ICD-10-CM | POA: Diagnosis not present

## 2022-10-02 DIAGNOSIS — I10 Essential (primary) hypertension: Secondary | ICD-10-CM | POA: Diagnosis not present

## 2022-10-02 DIAGNOSIS — Z20822 Contact with and (suspected) exposure to covid-19: Secondary | ICD-10-CM | POA: Diagnosis not present

## 2022-10-02 DIAGNOSIS — R918 Other nonspecific abnormal finding of lung field: Secondary | ICD-10-CM | POA: Diagnosis not present

## 2022-10-02 DIAGNOSIS — Z79899 Other long term (current) drug therapy: Secondary | ICD-10-CM | POA: Diagnosis not present

## 2022-10-02 DIAGNOSIS — J101 Influenza due to other identified influenza virus with other respiratory manifestations: Secondary | ICD-10-CM | POA: Diagnosis not present

## 2022-10-02 DIAGNOSIS — J111 Influenza due to unidentified influenza virus with other respiratory manifestations: Secondary | ICD-10-CM

## 2022-10-02 DIAGNOSIS — I700 Atherosclerosis of aorta: Secondary | ICD-10-CM | POA: Diagnosis not present

## 2022-10-02 DIAGNOSIS — Z8546 Personal history of malignant neoplasm of prostate: Secondary | ICD-10-CM | POA: Diagnosis not present

## 2022-10-02 DIAGNOSIS — R0602 Shortness of breath: Secondary | ICD-10-CM | POA: Diagnosis not present

## 2022-10-02 DIAGNOSIS — J441 Chronic obstructive pulmonary disease with (acute) exacerbation: Secondary | ICD-10-CM | POA: Insufficient documentation

## 2022-10-02 DIAGNOSIS — Z7951 Long term (current) use of inhaled steroids: Secondary | ICD-10-CM | POA: Diagnosis not present

## 2022-10-02 LAB — RESP PANEL BY RT-PCR (RSV, FLU A&B, COVID)  RVPGX2
Influenza A by PCR: POSITIVE — AB
Influenza B by PCR: NEGATIVE
Resp Syncytial Virus by PCR: NEGATIVE
SARS Coronavirus 2 by RT PCR: NEGATIVE

## 2022-10-02 LAB — CBC WITH DIFFERENTIAL/PLATELET
Abs Immature Granulocytes: 0.17 10*3/uL — ABNORMAL HIGH (ref 0.00–0.07)
Basophils Absolute: 0 10*3/uL (ref 0.0–0.1)
Basophils Relative: 0 %
Eosinophils Absolute: 0.1 10*3/uL (ref 0.0–0.5)
Eosinophils Relative: 1 %
HCT: 32.7 % — ABNORMAL LOW (ref 39.0–52.0)
Hemoglobin: 11.3 g/dL — ABNORMAL LOW (ref 13.0–17.0)
Immature Granulocytes: 2 %
Lymphocytes Relative: 15 %
Lymphs Abs: 1.2 10*3/uL (ref 0.7–4.0)
MCH: 30.2 pg (ref 26.0–34.0)
MCHC: 34.6 g/dL (ref 30.0–36.0)
MCV: 87.4 fL (ref 80.0–100.0)
Monocytes Absolute: 0.6 10*3/uL (ref 0.1–1.0)
Monocytes Relative: 7 %
Neutro Abs: 5.9 10*3/uL (ref 1.7–7.7)
Neutrophils Relative %: 75 %
Platelets: 214 10*3/uL (ref 150–400)
RBC: 3.74 MIL/uL — ABNORMAL LOW (ref 4.22–5.81)
RDW: 13.7 % (ref 11.5–15.5)
WBC: 8 10*3/uL (ref 4.0–10.5)
nRBC: 0 % (ref 0.0–0.2)

## 2022-10-02 LAB — BASIC METABOLIC PANEL
Anion gap: 10 (ref 5–15)
BUN: 19 mg/dL (ref 8–23)
CO2: 23 mmol/L (ref 22–32)
Calcium: 8.2 mg/dL — ABNORMAL LOW (ref 8.9–10.3)
Chloride: 102 mmol/L (ref 98–111)
Creatinine, Ser: 1.07 mg/dL (ref 0.61–1.24)
GFR, Estimated: >60 mL/min (ref >60)
Glucose, Bld: 108 mg/dL — ABNORMAL HIGH (ref 70–99)
Potassium: 3.7 mmol/L (ref 3.5–5.1)
Sodium: 135 mmol/L (ref 135–145)

## 2022-10-02 MED ORDER — BENZONATATE 100 MG PO CAPS
100.0000 mg | ORAL_CAPSULE | Freq: Once | ORAL | Status: AC
  Administered 2022-10-02: 100 mg via ORAL
  Filled 2022-10-02: qty 1

## 2022-10-02 MED ORDER — PREDNISONE 20 MG PO TABS: 40.0000 mg | ORAL_TABLET | Freq: Every day | ORAL | 0 refills | Status: DC

## 2022-10-02 MED ORDER — IPRATROPIUM-ALBUTEROL 0.5-2.5 (3) MG/3ML IN SOLN
3.0000 mL | Freq: Once | RESPIRATORY_TRACT | Status: AC
  Administered 2022-10-02: 3 mL via RESPIRATORY_TRACT
  Filled 2022-10-02: qty 3

## 2022-10-02 NOTE — Discharge Instructions (Signed)
You have the flu this is a viral infection that will likely start to improve after 7-10 days, antibiotics are not helpful in treating viral infections. Since your symptoms have been present for more than 2 days Tamiflu will give no additional benefit.  Please make sure you are drinking plenty of fluids. You can treat your symptoms supportively with tylenol 650 mg/1000mg  and ibuprofen 600 mg every 6 hours for fevers and pains. For nasal congestion you can use Zyrtec and Flonase to help with nasal congestion. To treat cough you can use over the counter cough medications such as Mucinex DM or Robitussin and throat lozenges. If your symptoms are not improving please follow up with you Primary doctor.   If you develop persistent fevers, shortness of breath or difficulty breathing, chest pain, severe headache and neck pain, persistent nausea and vomiting or other new or concerning symptoms return to the Emergency department.

## 2022-10-02 NOTE — ED Provider Notes (Signed)
[facility] Provider Note   CSN: [id number] Arrival date & time: 10/02/22  4403     History  Chief Complaint  Patient presents with   Cough    RAYVON DAKIN is a 76 y.o. 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  He reports some chest pain only with cough, denies chest pain at rest or with exertion.  PCP called in [facility] yesterday but patient has not taken any, reporting that it looks slightly different from what was in the bottle.  Patient reports that he and his wife have the same symptoms, but he has it worse than her.  He is coughing up clear to whitish sputum.   Cough Associated symptoms: shortness of breath        Home Medications Prior to Admission medications   Medication Sig Start Date End Date Taking? Authorizing Provider  predniSONE (DELTASONE) 20 MG tablet Take 2 tablets (40 mg total) by mouth daily. 10/02/22  Yes Jaimie Pippins H, PA-C  apixaban (ELIQUIS) 5 MG TABS tablet Take 1 tablet (5 mg total) by mouth 2 (two) times daily. 11/30/21   Festus Aloe, MD  aspirin EC 81 MG EC tablet Take 1 tablet (81 mg total) by mouth daily. Swallow whole. 08/01/21   Antony Odea, PA-C  benzonatate (TESSALON) 200 MG capsule Take 1 capsule (200 mg total) by mouth 3 (three) times daily as needed for cough. Take as needed for cough 10/01/22   [name], MD  brimonidine (ALPHAGAN) 0.2 % ophthalmic solution Place 1 drop into the left eye in the morning and at bedtime.  09/10/17   [provider]  budesonide (ENTOCORT EC) 3 MG 24 hr capsule Take 9 mg by mouth daily. 04/20/22   [provider]  cholecalciferol  (VITAMIN D3) 25 MCG (1000 UNIT) tablet Take 10,000 mcg by mouth 4 (four) times a week.    [provider]  diazepam (VALIUM) 5 MG tablet TAKE 1/2 TO 2 TABLETS(2.5 TO 10 MG) BY MOUTH EVERY 12 HOURS AS NEEDED FOR ANXIETY 08/21/22   Dutch Quint B, FNP  Evolocumab (REPATHA SURECLICK) 474 MG/ML SOAJ Inject 140 mg into the skin every 14 (fourteen) days. 07/06/22   Evans Lance, MD  furosemide (LASIX) 20 MG tablet TAKE 1 TABLET BY MOUTH DAILY AS  NEEDED 12/22/21   [name], MD  latanoprost (XALATAN) 0.005 % ophthalmic solution Place 1 drop into the left eye at bedtime.     [provider]  levalbuterol (XOPENEX HFA) 45 MCG/ACT inhaler INHALE 2 PUFFS BY MOUTH EVERY 4 HOURS AS NEEDED FOR WHEEZING 08/12/22   [name], MD  MAGnesium-Oxide 400 (240 Mg) MG tablet Take 1 tablet (400 mg total) by mouth 3 (three) times daily. 05/20/22   [name], MD  metoprolol tartrate (LOPRESSOR) 100 MG tablet Take 1 tablet (100 mg total) by mouth 2 (two) times daily. 04/03/22 03/29/23  Evans Lance, MD  Multiple Vitamins-Minerals (PRESERVISION AREDS 2) CAPS Take 1 tablet by mouth daily.    [provider]  nitroGLYCERIN (NITROSTAT) 0.4 MG SL tablet Place 1 tablet (0.4 mg total) under the tongue every 5 (five) minutes as needed for chest pain. 04/14/20   Dia Crawford  J, MD  omeprazole (PRILOSEC OTC) 20 MG tablet Take 20 mg by mouth daily.    [provider]  Ranibizumab (LUCENTIS IO) Inject 1 Dose into the eye every 6 (six) weeks. Bilateral eye by Dr Gerarda Fraction    [provider]  tamsulosin (FLOMAX) 0.4 MG CAPS capsule Take 0.4 mg by mouth 2 (two) times daily.    [provider]      Allergies    Albumin (human), Polymyxin b-trimethoprim, Pseudoephedrine, Codeine, Guaiacol, Statins, Gabapentin, Meloxicam, Peppermint flavor, Pseudoephedrine-guaifenesin, Rosuvastatin calcium, Tapentadol, Ciprofloxacin, Moxifloxacin, and Rofecoxib    Review of Systems    Review of Systems  Respiratory:  Positive for cough, chest tightness and shortness of breath.   All other systems reviewed and are negative.   Physical Exam Updated Vital Signs BP 120/79 (BP Location: Left Arm)   Pulse 85   Temp 98.3 F (36.8 C) (Oral)   Resp (!) 22   Ht 5\' 4"  (1.626 m)   Wt 78 kg   SpO2 98%   BMI 29.52 kg/m  Physical Exam Vitals and nursing note reviewed.  Constitutional:      General: He is not in acute distress.    Appearance: Normal appearance. He is ill-appearing.     Comments: Somewhat chronically ill-appearing, but no acute distress  HENT:     Head: Normocephalic and atraumatic.  Eyes:     General:        Right eye: No discharge.        Left eye: No discharge.  Cardiovascular:     Rate and Rhythm: Normal rate. Rhythm irregular.     Heart sounds: No murmur heard.    No friction rub. No gallop.  Pulmonary:     Effort: Pulmonary effort is normal.     Breath sounds: Normal breath sounds.     Comments: Patient with coarse lung sounds throughout, rhonchi, mild wheezing, no stridor, rales, intermittent mild tachypnea but no acute respiratory distress Abdominal:     General: Bowel sounds are normal.     Palpations: Abdomen is soft.  Skin:    General: Skin is warm and dry.     Capillary Refill: Capillary refill takes less than 2 seconds.  Neurological:     Mental Status: He is alert and oriented to person, place, and time.  Psychiatric:        Mood and Affect: Mood normal.        Behavior: Behavior normal.     ED Results / Procedures / Treatments   Labs (all labs ordered are listed, but only abnormal results are displayed) Labs Reviewed  RESP PANEL BY RT-PCR (RSV, FLU A&B, COVID)  RVPGX2 - Abnormal; Notable for the following components:      Result Value   Influenza A by PCR POSITIVE (*)    All other components within normal limits  CBC WITH DIFFERENTIAL/PLATELET - Abnormal; Notable for the following components:   RBC 3.74 (*)     Hemoglobin 11.3 (*)    HCT 32.7 (*)    Abs Immature Granulocytes 0.17 (*)    All other components within normal limits  BASIC METABOLIC PANEL - Abnormal; Notable for the following components:   Glucose, Bld 108 (*)    Calcium 8.2 (*)    All other components within normal limits    EKG None  Radiology DG Chest 2 View  Result Date: 10/02/2022 CLINICAL DATA:  76 year old male with history of shortness of breath and cough. EXAM: CHEST -  2 VIEW COMPARISON:  Chest x-ray 01/16/2022. FINDINGS: Lung volumes are normal. No consolidative airspace disease. Diffuse interstitial prominence and widespread peribronchial cuffing scattered throughout the lungs bilaterally, similar to prior study. No pleural effusions. No pneumothorax. No pulmonary nodule or mass noted. Pulmonary vasculature and the cardiomediastinal silhouette are within normal limits. Atherosclerosis in the thoracic aorta. Status post median sternotomy for CABG. Orthopedic fixation hardware in the lower cervical spine incidentally noted. IMPRESSION: 1. Diffuse interstitial prominence and peribronchial cuffing which appears to be chronic compared to prior examinations, suggesting chronic bronchitis. No definite radiographic evidence of acute cardiopulmonary disease. 2. Aortic atherosclerosis. Electronically Signed   By: Vinnie Langton M.D.   On: 10/02/2022 10:27    Procedures Procedures    Medications Ordered in ED Medications  ipratropium-albuterol (DUONEB) 0.5-2.5 (3) MG/3ML nebulizer solution 3 mL (3 mLs Nebulization Given 10/02/22 1026)  benzonatate (TESSALON) capsule 100 mg (100 mg Oral Given 10/02/22 1046)    ED Course/ Medical Decision Making/ A&P                           Medical Decision Making Amount and/or Complexity of Data Reviewed Labs: ordered. Radiology: ordered.  Risk Prescription drug management.   This patient is a 76 y.o. male  who presents to the ED for concern of 6-7 days of cough, sore throat,  congestion, shortness of breath, without chest pain.   Differential diagnoses prior to evaluation: The emergent differential diagnosis includes, but is not limited to,  asthma exacerbation, COPD exacerbation, acute upper respiratory infection, acute bronchitis, chronic bronchitis, interstitial lung disease, ARDS, PE, pneumonia, atypical ACS, carbon monoxide poisoning, spontaneous pneumothorax versus other, most suspicious for viral upper respiratory infection with COVID, flu, RSV given patient's symptoms, however given his COPD history, also with some suspicion for developing pneumonia, or COPD exacerbation.   This is not an exhaustive differential.   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 The pertinent findings include: Patient with mild expiratory wheeze, vital signs are overall stable although he has had significant coughing during his evaluation, and some brief episodes of tachypnea, respirations 22 on arrival.  He is afebrile.  No focal consolidation noted lung fields.  Lab Tests/Imaging studies: I personally interpreted labs/imaging and the pertinent results include: RVP positive for flu, CBC overall unremarkable, very mild anemia, hemoglobin 11.3, BMP unremarkable, mild hypocalcemia, calcium 8.2.  Notably no leukocytosis or significant electrolyte abnormalities..  I independently interpreted plain film x-ray of the chest which shows chronic emphysematous, COPD changes, peribronchial cuffing, no evidence of acute intrathoracic abnormality, acute pnuemonia. I agree with the radiologist interpretation.  Cardiac monitoring: EKG obtained and interpreted by my attending physician which shows: afib, no rvr, no significant change from last tracing   Medications: I ordered  medication including tessalon for cough, duo neb for wheezing. Improvement noted on repeat examination.  I have reviewed the patients home medicines and have made adjustments as needed.   Disposition: After consideration of the diagnostic results and the patients response to treatment, I feel that patient's symptoms are consistent with the flu, but with signs and symptoms of COPD exacerbation secondary to flu.  Will discharge on steroids, encourage him to use his home levalbuterol, and encourage close PCP follow-up.  Symptomatic care otherwise, patient can use Tessalon Perles prescribed by  his PCP, Tylenol, plenty of fluids.  emergency department workup does not suggest an emergent condition requiring admission or immediate intervention beyond what has been performed at this time. The plan is: as above. The patient is safe for discharge and has been instructed to return immediately for worsening symptoms, change in symptoms or any other concerns.  Final Clinical Impression(s) / ED Diagnoses Final diagnoses:  Flu  COPD with acute exacerbation [facility])    Rx / DC Orders ED Discharge Orders          Ordered    predniSONE (DELTASONE) 20 MG tablet  Daily        10/02/22 1154              Adain Geurin, [place] H, PA-C 10/02/22 [place], [place], DO 10/02/22 1527

## 2022-10-02 NOTE — ED Triage Notes (Addendum)
Pt arrives POV. Cough and chest congestion since weekend. Reports CP Reports sick after going to [employer]. PCP called in [place] yesterday, but pt did not take one

## 2022-10-07 DIAGNOSIS — Z961 Presence of intraocular lens: Secondary | ICD-10-CM | POA: Diagnosis not present

## 2022-10-07 DIAGNOSIS — H353231 Exudative age-related macular degeneration, bilateral, with active choroidal neovascularization: Secondary | ICD-10-CM | POA: Diagnosis not present

## 2022-10-07 DIAGNOSIS — H26493 Other secondary cataract, bilateral: Secondary | ICD-10-CM | POA: Diagnosis not present

## 2022-10-07 DIAGNOSIS — H401122 Primary open-angle glaucoma, left eye, moderate stage: Secondary | ICD-10-CM | POA: Diagnosis not present

## 2022-10-09 ENCOUNTER — Ambulatory Visit: Payer: Medicare Other | Attending: Internal Medicine | Admitting: Internal Medicine

## 2022-10-09 ENCOUNTER — Encounter: Payer: Self-pay | Admitting: Internal Medicine

## 2022-10-09 VITALS — BP 152/76 | HR 86 | Ht 64.0 in | Wt 184.2 lb

## 2022-10-09 DIAGNOSIS — I4891 Unspecified atrial fibrillation: Secondary | ICD-10-CM | POA: Diagnosis not present

## 2022-10-09 DIAGNOSIS — I10 Essential (primary) hypertension: Secondary | ICD-10-CM | POA: Diagnosis not present

## 2022-10-09 DIAGNOSIS — I2510 Atherosclerotic heart disease of native coronary artery without angina pectoris: Secondary | ICD-10-CM | POA: Diagnosis not present

## 2022-10-09 NOTE — Patient Instructions (Addendum)
Medication Instructions:  Your physician recommends that you continue on your current medications as directed. Please refer to the Current Medication list given to you today.  *If you need a refill on your cardiac medications before your next appointment, please call your pharmacy*  Lab Work: None ordered.  If you have labs (blood work) drawn today and your tests are completely normal, you will receive your results only by: [place] (if you have MyChart) OR A paper copy in the mail If you have any lab test that is abnormal or we need to change your treatment, we will call you to review the results.  Testing/Procedures: None ordered.  Follow-Up: At [facility], you and your health needs are our priority.  As part of our continuing mission to provide you with exceptional heart care, we have created designated Provider Care Teams.  These Care Teams include your primary Cardiologist (physician) and Advanced Practice Providers (APPs -  Physician Assistants and Nurse Practitioners) who all work together to provide you with the care you need, when you need it.  We recommend signing up for the patient portal called "MyChart".  Sign up information is provided on this After Visit Summary.  MyChart is used to connect with patients for Virtual Visits (Telemedicine).  Patients are able to view lab/test results, encounter notes, upcoming appointments, etc.  Non-urgent messages can be sent to your provider as well.   To learn more about what you can do with MyChart, go to [email or web address].    Your next appointment:   Please schedule a 6 month follow up appointment with Dr. Lovena Le  The format for your next appointment:   In Person  Provider:   Cristopher Peru, MD{or one of the following Advanced Practice Providers on your designated Care Team:   Tommye Standard, [place] Legrand Como "Jonni Sanger" Chalmers Cater, [place]   Important Information About [place]

## 2022-10-09 NOTE — Progress Notes (Signed)
HPI Mr. Sturdevant returns today for followup. He is a pleasant 77 yo man with CAD s/p CABG, PAF now persistent since stopping his propafenone. He has a h/o prostate CA and has undergone surgery, chemo and XRT. He is improving. He checks his HR with a pulse ox and his bp cuff. He has mild dyspnea with exertion. He has been bothered by fleeting pains at his CABG incision and chest tube sites. Allergies  Allergen Reactions   Albumin (Human) Anaphylaxis   Polymyxin B-Trimethoprim Swelling    Eye drops made eyes swell   Pseudoephedrine Other (See Comments)    Stomach cramps   Codeine Hives, Itching and Rash   Guaiacol Other (See Comments)    Hallucinations   Statins Other (See Comments)    Muscle cramps    Gabapentin Other (See Comments)    Pt unsure of sensitivity   Meloxicam Other (See Comments)    Pt unsure of sensitivity   Peppermint Flavor Other (See Comments)    Severe cramping   Pseudoephedrine-Guaifenesin Nausea And Vomiting    Stomach cramps   Rosuvastatin Calcium Other (See Comments)    Muscle aches   Tapentadol Other (See Comments)    Pt unsure of sensitivity   Ciprofloxacin Hives, Itching, Nausea Only and Rash   Moxifloxacin Nausea Only and Other (See Comments)    Headaches, stomach cramps    Rofecoxib Other (See Comments)    Stomach cramping     Current Outpatient Medications  Medication Sig Dispense Refill   apixaban (ELIQUIS) 5 MG TABS tablet Take 1 tablet (5 mg total) by mouth 2 (two) times daily. 180 tablet 3   aspirin EC 81 MG EC tablet Take 1 tablet (81 mg total) by mouth daily. Swallow whole. 30 tablet 11   benzonatate (TESSALON) 200 MG capsule Take 1 capsule (200 mg total) by mouth 3 (three) times daily as needed for cough. Take as needed for cough 30 capsule 0   brimonidine (ALPHAGAN) 0.2 % ophthalmic solution Place 1 drop into the left eye in the morning and at bedtime.   7   budesonide (ENTOCORT EC) 3 MG 24 hr capsule Take 9 mg by mouth daily.      cholecalciferol (VITAMIN D3) 25 MCG (1000 UNIT) tablet Take 10,000 mcg by mouth 4 (four) times a week.     diazepam (VALIUM) 5 MG tablet TAKE 1/2 TO 2 TABLETS(2.5 TO 10 MG) BY MOUTH EVERY 12 HOURS AS NEEDED FOR ANXIETY 30 tablet 1   Evolocumab (REPATHA SURECLICK) 009 MG/ML SOAJ Inject 140 mg into the skin every 14 (fourteen) days. 6 mL 3   furosemide (LASIX) 20 MG tablet TAKE 1 TABLET BY MOUTH DAILY AS  NEEDED 90 tablet 3   latanoprost (XALATAN) 0.005 % ophthalmic solution Place 1 drop into the left eye at bedtime.      levalbuterol (XOPENEX HFA) 45 MCG/ACT inhaler INHALE 2 PUFFS BY MOUTH EVERY 4 HOURS AS NEEDED FOR WHEEZING 45 g 2   MAGnesium-Oxide 400 (240 Mg) MG tablet Take 1 tablet (400 mg total) by mouth 3 (three) times daily. 270 tablet 1   metoprolol tartrate (LOPRESSOR) 100 MG tablet Take 1 tablet (100 mg total) by mouth 2 (two) times daily. 180 tablet 3   Multiple Vitamins-Minerals (PRESERVISION AREDS 2) CAPS Take 1 tablet by mouth daily.     nitroGLYCERIN (NITROSTAT) 0.4 MG SL tablet Place 1 tablet (0.4 mg total) under the tongue every 5 (five) minutes as needed for chest  pain. 30 tablet 0   omeprazole (PRILOSEC OTC) 20 MG tablet Take 20 mg by mouth daily.     predniSONE (DELTASONE) 20 MG tablet Take 2 tablets (40 mg total) by mouth daily. 10 tablet 0   Ranibizumab (LUCENTIS IO) Inject 1 Dose into the eye every 6 (six) weeks. Bilateral eye by Dr Gerarda Fraction     tamsulosin (FLOMAX) 0.4 MG CAPS capsule Take 0.4 mg by mouth 2 (two) times daily.     No current facility-administered medications for this visit.     Past Medical History:  Diagnosis Date   Anemia    Anticoagulant long-term use    eliquis--- mananged by cardiology   BPH with urinary obstruction    CAD (coronary artery disease)    cardiologist--- dr g. Avrie Kedzierski--- cath 04-22-2011  moderate LM stenosis, borderline sig pRCA, sig stenosis mid to distal LAD ;  aggressive medical therapy;   cath 07-21-2021 for chest pain w/ ST  changes,  severe disease;  s/p CABG x4 07-22-2021   Chronic diastolic CHF (congestive heart failure) ([place]) 03/2018   followed by cardiology   CKD (chronic kidney disease), stage III ([place])    COPD (chronic obstructive pulmonary disease) ([place])    followed by pcp   DOE (dyspnea on exertion)    per pt when walk >100yds,  per pt rides outside bike 4-8 miles daily without sob,  household chores and yard work without sob   Eczema    GERD (gastroesophageal reflux disease)    Glaucoma, left eye    Heart murmur    Hemorrhoids    History of adenomatous polyp of colon    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 Bryan Lemma--- dx by biopsy 01-28-2021   Macular degeneration of both eyes    followed by dr c. Cordelia Pen--- bilateral eye injection every 6 wks   Malignant neoplasm prostate [facility]) 07/2021   urologist-- dr Diona Fanti;  dx 10/ 2022,  Gleason 5+5, PSA 1.8   Osteoporosis 05/31/2016   PAF (paroxysmal atrial fibrillation) ([place]) 03/2012   cardiologist-- dr g. Catalyna Reilly;  first dx 06/ 2013 AFlutter w/ RVR   Vitamin D deficiency     ROS:   All systems reviewed and negative except as noted in the HPI.   Past Surgical History:  Procedure Laterality Date   ANTERIOR CERVICAL DECOMP/DISCECTOMY FUSION  06/08/2002   @[facility] ;  C5--C7   APPENDECTOMY  1953   CARDIAC CATHETERIZATION  04/22/2011   moderate left main and RCA stenosis not significant by FFR and  IVUS on medical therapy   CARDIOVERSION N/A 04/25/2018   Procedure: CARDIOVERSION;  Surgeon: Skeet Latch, MD;  Location: [place];  Service: Cardiovascular;  Laterality: N/A;   CATARACT EXTRACTION W/ INTRAOCULAR LENS IMPLANT Bilateral 2021   COLONOSCOPY WITH ESOPHAGOGASTRODUODENOSCOPY (EGD)  01/28/2021  by [email or web address]   CORONARY ARTERY BYPASS GRAFT N/A 07/22/2021   Procedure: 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, ON PUMP, USING LEFT INTERNAL MAMMARY ARTERY AND ENDOSCOPICALLY HARVESTED RIGHT GREATER SAPHENOUS VEIN;  Surgeon: Melrose Nakayama, MD;  Location: [place];  Service: Open Heart Surgery;  Laterality: N/A;   ENDOVEIN HARVEST OF GREATER SAPHENOUS VEIN  07/22/2021   Procedure: ENDOVEIN HARVEST OF GREATER SAPHENOUS VEIN;  Surgeon: Melrose Nakayama, MD;  Location: [place];  Service: Open Heart Surgery;;   EXTRACORPOREAL SHOCK WAVE LITHOTRIPSY     x2  1990s   FOOT SURGERY Right 1970   calcification removed from top of foot   GOLD SEED IMPLANT N/A 11/28/2021   Procedure: GOLD SEED IMPLANT;  Surgeon: Festus Aloe, MD;  Location: [facility];  Service: Urology;  Laterality: N/A;   KNEE ARTHROSCOPY Right 1991   LAPAROSCOPIC CHOLECYSTECTOMY  2000   LEFT HEART CATH AND CORONARY ANGIOGRAPHY N/A 07/21/2021   Procedure: LEFT HEART CATH AND CORONARY ANGIOGRAPHY;  Surgeon: Lorretta Harp, MD;  Location: [place] [facility];  Service: Cardiovascular;  Laterality: N/A;   SPACE OAR INSTILLATION N/A 11/28/2021   Procedure: SPACE OAR INSTILLATION;  Surgeon: Festus Aloe, MD;  Location[facility];  Service: Urology;  Laterality: N/A;   TEE WITHOUT CARDIOVERSION N/A 07/22/2021   Procedure: TRANSESOPHAGEAL ECHOCARDIOGRAM (TEE);  Surgeon: Melrose Nakayama, MD;  Location: [place];  Service: Open Heart Surgery;  Laterality: N/A;   TOTAL KNEE ARTHROPLASTY Right 08/14/2009   @[facility]    TRANSURETHRAL RESECTION OF PROSTATE N/A 07/17/2021   Procedure:  TRANSURETHRAL RESECTION OF THE PROSTATE (TURP);  Surgeon: [name], MD;  Location: [facility];  Service: Urology;  Laterality: N/A;  1 HR     Family History  Problem Relation Age of Onset   Heart disease Mother    Diabetes Mother    Cirrhosis Mother    Emphysema Mother        never smoked but 2nd hand through her spouse   Hypertension Mother    Macular degeneration Mother    Heart disease Father    Cancer Father        prostate   Hyperlipidemia Father    Hypertension Father    Varicose Veins Father    Heart attack Father    Peripheral vascular disease Father    Heart disease Sister    Arthritis Sister    Hyperlipidemia Sister    Obesity Sister    Macular degeneration Sister    Heart disease Brother        5 stents   Hyperlipidemia Brother    Macular degeneration Maternal Grandfather    Cirrhosis Sister    Obesity Sister    Arthritis Sister    Heart disease Sister    Obesity Sister    Liver disease Other    Prostate cancer Other    Coronary artery disease Other    Colon cancer Neg Hx    Esophageal cancer Neg Hx    Rectal cancer Neg Hx    Stomach cancer Neg Hx      Social History   Socioeconomic History   Marital status: Married    Spouse name: Thayer Headings   Number of children: 0   Years of education: Not on file   Highest education level: Not on file  Occupational History   Occupation: retired    Fish farm manager: RETIRED  Tobacco Use   Smoking status: Former    Packs/day: 0.50  Years: 50.00    Total pack years: 25.00    Types: Cigarettes    Quit date: 05/01/2009    Years since quitting: 13.4   Smokeless tobacco: Former    Types: Chew    Quit date: 1995   Tobacco comments:    Former smoker 01/27/22  Vaping Use   Vaping Use: Never used  Substance and Sexual Activity   Alcohol use: Yes    Alcohol/week: 2.0 standard drinks of alcohol    Types: 2 Cans of beer per week    Comment: 1 beer daily 2 days a week 01/27/22   Drug use: Never    Sexual activity: Yes    Comment: lives with wife, no dietary restrictions  Other Topics Concern   Not on file  Social History Narrative   Lives with wife   Social Determinants of Health   Financial Resource Strain: Low Risk  (03/18/2022)   Overall Financial Resource Strain (CARDIA)    Difficulty of Paying Living Expenses: Not hard at all  Food Insecurity: No Food Insecurity (06/05/2022)   Hunger Vital Sign    Worried About Running Out of Food in the Last Year: Never true    Ran Out of Food in the Last Year: Never true  Transportation Needs: No Transportation Needs (06/05/2022)   PRAPARE - Hydrologist (Medical): No    Lack of Transportation (Non-Medical): No  Physical Activity: Sufficiently Active (03/18/2022)   Exercise Vital Sign    Days of Exercise per Week: 7 days    Minutes of Exercise per Session: 30 min  Stress: No Stress Concern Present (03/18/2022)   [place]    Feeling of Stress : Not at all  Social Connections: Moderately Integrated (03/18/2022)   Social Connection and Isolation Panel [NHANES]    Frequency of Communication with Friends and Family: More than three times a week    Frequency of Social Gatherings with Friends and Family: More than three times a week    Attends Religious Services: Never    Marine scientist or Organizations: Yes    Attends Music therapist: More than 4 times per year    Marital Status: Married  Human resources officer Violence: Not At Risk (03/18/2022)   Humiliation, Afraid, Rape, and Kick questionnaire    Fear of Current or Ex-Partner: No    Emotionally Abused: No    Physically Abused: No    Sexually Abused: No     BP (!) 152/76   Pulse 86   Ht 5\' 4"  (1.626 m)   Wt 184 lb 3.2 oz (83.6 kg)   SpO2 95%   BMI 31.62 kg/m   Physical Exam:  Well appearing NAD HEENT: Unremarkable Neck:  No JVD, no thyromegally Lymphatics:  No  adenopathy Back:  No CVA tenderness Lungs:  Clear HEART:  IRegular rate rhythm, no murmurs, no rubs, no clicks Abd:  soft, positive bowel sounds, no organomegally, no rebound, no guarding Ext:  2 plus pulses, no edema, no cyanosis, no clubbing Skin:  No rashes no nodules Neuro:  CN II through XII intact, motor grossly intact  DEVICE  Normal device function.  See PaceArt for details.   Assess/Plan:  Atrial fib - his VR is reasonably well controlled. Continue current meds. CAD - he denies anginal symptoms. He still has non-exertional chest pain.  HTN - his bp is well controlled. Coags - he will continue systemic anti-coag with  eliquis.   Carleene Overlie Arlayne Liggins,MD

## 2022-10-14 ENCOUNTER — Other Ambulatory Visit (HOSPITAL_COMMUNITY): Payer: Self-pay

## 2022-10-17 ENCOUNTER — Other Ambulatory Visit: Payer: Self-pay | Admitting: Internal Medicine

## 2022-10-17 DIAGNOSIS — I4891 Unspecified atrial fibrillation: Secondary | ICD-10-CM

## 2022-10-19 ENCOUNTER — Other Ambulatory Visit: Payer: Self-pay | Admitting: Family Medicine

## 2022-10-19 ENCOUNTER — Telehealth: Payer: Self-pay | Admitting: Family Medicine

## 2022-10-19 MED ORDER — DOXYCYCLINE HYCLATE 100 MG PO TABS: 100.0000 mg | ORAL_TABLET | Freq: Two times a day (BID) | ORAL | 0 refills | Status: DC

## 2022-10-19 NOTE — Telephone Encounter (Signed)
Pt called stating that he had a visit to the ED recently and went to go see his Programmer, applications. Pt stating that he had looked over the imaging and told him that he had bronchitis and he needed to see if Dr. Abner Greenspan could send in a antibiotic. Pt was advised that he would need to be seen to follow up with Dr. Abner Greenspan is this issue hadn't been addressed before. Pt stated that he has an appt with Dr. Abner Greenspan next week and that she will normally call something in when asked. Advised a note would be sent back to look into this for him. Pt acknowledged understanding.

## 2022-10-19 NOTE — Telephone Encounter (Signed)
Prescription refill request for Eliquis received. Indication: Afib  Last office visit: 10/09/22 Ladona Ridgel)  Scr: 1.07 (10/02/22)  Age: 77 Weight: 83.6kg  Appropriate dose and refill sent to requested pharmacy.

## 2022-10-20 NOTE — Telephone Encounter (Signed)
Called pt was advised  

## 2022-10-22 ENCOUNTER — Ambulatory Visit: Payer: Medicare Other | Admitting: Family Medicine

## 2022-10-22 DIAGNOSIS — Z961 Presence of intraocular lens: Secondary | ICD-10-CM | POA: Diagnosis not present

## 2022-10-22 DIAGNOSIS — H353231 Exudative age-related macular degeneration, bilateral, with active choroidal neovascularization: Secondary | ICD-10-CM | POA: Diagnosis not present

## 2022-10-22 DIAGNOSIS — Z7901 Long term (current) use of anticoagulants: Secondary | ICD-10-CM | POA: Diagnosis not present

## 2022-10-22 DIAGNOSIS — Z8673 Personal history of transient ischemic attack (TIA), and cerebral infarction without residual deficits: Secondary | ICD-10-CM | POA: Diagnosis not present

## 2022-10-24 DIAGNOSIS — H401122 Primary open-angle glaucoma, left eye, moderate stage: Secondary | ICD-10-CM | POA: Diagnosis not present

## 2022-10-26 NOTE — Assessment & Plan Note (Signed)
No recent exacerbation 

## 2022-10-26 NOTE — Assessment & Plan Note (Signed)
Well controlled, no changes to meds. Encouraged heart healthy diet such as the DASH diet and exercise as tolerated.

## 2022-10-26 NOTE — Assessment & Plan Note (Addendum)
Encourage heart healthy diet such as MIND or DASH diet, increase exercise, avoid trans fats, simple carbohydrates and processed foods, consider a krill or fish or flaxseed oil cap daily. Does not tolerate statins, is tolerating Repatha

## 2022-10-26 NOTE — Assessment & Plan Note (Signed)
hgba1c acceptable, minimize simple carbs. Increase exercise as tolerated.  

## 2022-10-27 ENCOUNTER — Ambulatory Visit (INDEPENDENT_AMBULATORY_CARE_PROVIDER_SITE_OTHER): Payer: Medicare Other | Admitting: Family Medicine

## 2022-10-27 VITALS — BP 130/72 | HR 114 | Temp 98.0°F | Resp 16 | Ht 64.0 in | Wt 177.2 lb

## 2022-10-27 DIAGNOSIS — I4819 Other persistent atrial fibrillation: Secondary | ICD-10-CM

## 2022-10-27 DIAGNOSIS — E559 Vitamin D deficiency, unspecified: Secondary | ICD-10-CM

## 2022-10-27 DIAGNOSIS — D509 Iron deficiency anemia, unspecified: Secondary | ICD-10-CM | POA: Diagnosis not present

## 2022-10-27 DIAGNOSIS — E782 Mixed hyperlipidemia: Secondary | ICD-10-CM

## 2022-10-27 DIAGNOSIS — R7982 Elevated C-reactive protein (CRP): Secondary | ICD-10-CM | POA: Diagnosis not present

## 2022-10-27 DIAGNOSIS — R739 Hyperglycemia, unspecified: Secondary | ICD-10-CM

## 2022-10-27 DIAGNOSIS — I10 Essential (primary) hypertension: Secondary | ICD-10-CM | POA: Diagnosis not present

## 2022-10-27 DIAGNOSIS — R197 Diarrhea, unspecified: Secondary | ICD-10-CM | POA: Diagnosis not present

## 2022-10-27 DIAGNOSIS — C61 Malignant neoplasm of prostate: Secondary | ICD-10-CM | POA: Diagnosis not present

## 2022-10-27 DIAGNOSIS — Z79899 Other long term (current) drug therapy: Secondary | ICD-10-CM

## 2022-10-27 DIAGNOSIS — I2510 Atherosclerotic heart disease of native coronary artery without angina pectoris: Secondary | ICD-10-CM

## 2022-10-27 DIAGNOSIS — I5033 Acute on chronic diastolic (congestive) heart failure: Secondary | ICD-10-CM

## 2022-10-27 NOTE — Patient Instructions (Signed)

## 2022-10-27 NOTE — Progress Notes (Signed)
Subjective:   By signing my name below, I, [name], attest that this documentation has been prepared under the direction and in the presence of [name], MD., 10/27/2022.    Patient ID: Ryan [name], male    DOB: [birth year], 77 y.o.   MRN: [id number]  Chief Complaint  Patient presents with   Follow-up    Follow up   HPI Patient is in today for an office visit and is accompanied by his wife. He denies CP/ palpitations/SOB/HA/congestion/ fever/ chills/GI or GU symptoms.  Bowel Habits Patient continues taking 3 Budesonide 3 mg daily but has stopped taking his iron supplements due to this causing black and loose stools. He is also taking probiotics to control his bowel habits and return his stools to normal form.  CABG He complains of frequent pains at his CABG incision and chest tube sites. He find relief when stretching and rubbing his chest. He was seen by his cardiologist Dr. Ladona Ridgel on 10/09/2022 and addressed this with him.  Hyperlipidemia He continues taking Repatha 140 mg/mL to manage his hyperlipidemia. Lab Results  Component Value Date   CHOL 109 10/27/2022   HDL 43.00 10/27/2022   LDLCALC 47 10/27/2022   LDLDIRECT 134.2 12/01/2013   TRIG 98.0 10/27/2022   CHOLHDL 3 10/27/2022   Past Medical History:  Diagnosis Date   Anemia    Anticoagulant long-term use    eliquis--- mananged by cardiology   BPH with urinary obstruction    CAD (coronary artery disease)    cardiologist--- dr g. taylor--- cath 04-22-2011  moderate LM stenosis, borderline sig pRCA, sig stenosis mid to distal LAD ;  aggressive medical therapy;   cath 07-21-2021 for chest pain w/ ST changes,  severe disease;  s/p CABG x4 07-22-2021   Chronic diastolic CHF (congestive heart failure) (HCC) 03/2018   followed by cardiology   CKD (chronic kidney disease), stage III (HCC)    COPD (chronic obstructive pulmonary disease) (HCC)    followed by pcp   DOE (dyspnea on exertion)    per pt when walk  >100yds,  per pt rides outside bike 4-8 miles daily without sob,  household chores and yard work without sob   Eczema    GERD (gastroesophageal reflux disease)    Glaucoma, left eye    Heart murmur    Hemorrhoids    History of adenomatous polyp of colon    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 Barron Alvine--- dx by biopsy 01-28-2021   Macular degeneration of both eyes    followed by dr c. 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 taylor;  first dx 06/ 2013 AFlutter w/ RVR   Vitamin D deficiency     Past Surgical History:  Procedure Laterality Date   ANTERIOR CERVICAL DECOMP/DISCECTOMY FUSION  06/08/2002   @[facility] ;  C5--C7   [place]  04/22/2011   moderate left main and RCA stenosis not significant by FFR and IVUS on medical therapy   CARDIOVERSION N/A 04/25/2018   Procedure: CARDIOVERSION;  Surgeon: Skeet Latch, MD;  Location: [place];  Service: Cardiovascular;  Laterality: N/A;   CATARACT EXTRACTION W/ INTRAOCULAR LENS IMPLANT Bilateral 2021   COLONOSCOPY WITH ESOPHAGOGASTRODUODENOSCOPY (EGD)  01/28/2021   by ZOXWRU   CORONARY ARTERY BYPASS GRAFT N/A 07/22/2021   Procedure:  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, ON PUMP, USING LEFT INTERNAL MAMMARY ARTERY AND ENDOSCOPICALLY HARVESTED RIGHT GREATER SAPHENOUS VEIN;  Surgeon: Melrose Nakayama, MD;  Location: [place];  Service: Open Heart Surgery;  Laterality: N/A;   ENDOVEIN HARVEST OF GREATER SAPHENOUS VEIN  07/22/2021   Procedure: ENDOVEIN HARVEST OF GREATER SAPHENOUS VEIN;  Surgeon: Melrose Nakayama, MD;  Location: [place];  Service: Open Heart Surgery;;   EXTRACORPOREAL SHOCK WAVE LITHOTRIPSY     x2  1990s   FOOT SURGERY Right 1970   calcification removed from top of foot   GOLD SEED IMPLANT N/A 11/28/2021   Procedure: GOLD SEED IMPLANT;  Surgeon: Festus Aloe, MD;  Location: [facility];  Service: Urology;  Laterality: N/A;   KNEE ARTHROSCOPY Right 1991   LAPAROSCOPIC CHOLECYSTECTOMY  2000   LEFT HEART CATH AND CORONARY ANGIOGRAPHY N/A 07/21/2021   Procedure: LEFT HEART CATH AND CORONARY ANGIOGRAPHY;  Surgeon: Lorretta Harp, MD;  Location: [place] [facility];  Service: Cardiovascular;  Laterality: N/A;   SPACE OAR INSTILLATION N/A 11/28/2021   Procedure: SPACE OAR INSTILLATION;  Surgeon: Festus Aloe, MD;  Location[facility];  Service: Urology;  Laterality: N/A;   TEE WITHOUT CARDIOVERSION N/A 07/22/2021   Procedure: TRANSESOPHAGEAL ECHOCARDIOGRAM (TEE);  Surgeon: Melrose Nakayama, MD;  Location: [place];  Service: Open Heart Surgery;  Laterality: N/A;   TOTAL KNEE ARTHROPLASTY Right 08/14/2009   @[facility]    TRANSURETHRAL RESECTION OF PROSTATE N/A 07/17/2021   Procedure: TRANSURETHRAL RESECTION OF THE PROSTATE (TURP);  Surgeon: [name], MD;  Location: [facility];  Service: Urology;  Laterality: N/A;  1 HR    Family History  Problem Relation Age of Onset   Heart disease Mother    Diabetes Mother    Cirrhosis Mother    Emphysema Mother        never smoked but 2nd hand through her spouse   Hypertension Mother    Macular  degeneration Mother    Heart disease Father    Cancer Father        prostate   Hyperlipidemia Father    Hypertension Father    Varicose Veins Father    Heart attack Father    Peripheral vascular disease Father    Heart disease Sister    Arthritis Sister    Hyperlipidemia Sister    Obesity Sister    Macular degeneration Sister    Heart disease Brother        5 stents   Hyperlipidemia Brother    Macular degeneration Maternal Grandfather    Cirrhosis Sister    Obesity Sister    Arthritis Sister    Heart disease Sister    Obesity Sister  Liver disease Other    Prostate cancer Other    Coronary artery disease Other    Colon cancer Neg Hx    Esophageal cancer Neg Hx    Rectal cancer Neg Hx    Stomach cancer Neg Hx     Social History   Socioeconomic History   Marital status: Married    Spouse name: [name]   Number of children: 0   Years of education: Not on file   Highest education level: Not on file  Occupational History   Occupation: retired    Associate Professor: RETIRED  Tobacco Use   Smoking status: Former    Packs/day: 0.50    Years: 50.00    Total pack years: 25.00    Types: Cigarettes    Quit date: 05/01/2009    Years since quitting: 13.5   Smokeless tobacco: Former    Types: Chew    Quit date: 1995   Tobacco comments:    Former smoker 01/27/22  Vaping Use   Vaping Use: Never used  Substance and Sexual Activity   Alcohol use: Yes    Alcohol/week: 2.0 standard drinks of alcohol    Types: 2 Cans of beer per week    Comment: 1 beer daily 2 days a week 01/27/22   Drug use: Never   Sexual activity: Yes    Comment: lives with wife, no dietary restrictions  Other Topics Concern   Not on file  Social History Narrative   Lives with wife   Social Determinants of Health   Financial Resource Strain: Low Risk  (03/18/2022)   Overall Financial Resource Strain (CARDIA)    Difficulty of Paying Living Expenses: Not hard at all  Food Insecurity: No Food Insecurity  (06/05/2022)   Hunger Vital Sign    Worried About Running Out of Food in the Last Year: Never true    Ran Out of Food in the Last Year: Never true  Transportation Needs: No Transportation Needs (06/05/2022)   PRAPARE - Administrator, Civil Service (Medical): No    Lack of Transportation (Non-Medical): No  Physical Activity: Sufficiently Active (03/18/2022)   Exercise Vital Sign    Days of Exercise per Week: 7 days    Minutes of Exercise per Session: 30 min  Stress: No Stress Concern Present (03/18/2022)   [employer] of Occupational Health - Occupational Stress Questionnaire    Feeling of Stress : Not at all  Social Connections: Moderately Integrated (03/18/2022)   Social Connection and Isolation Panel [NHANES]    Frequency of Communication with Friends and Family: More than three times a week    Frequency of Social Gatherings with Friends and Family: More than three times a week    Attends Religious Services: Never    Database administrator or Organizations: Yes    Attends Engineer, structural: More than 4 times per year    Marital Status: Married  Catering manager Violence: Not At Risk (03/18/2022)   Humiliation, Afraid, Rape, and Kick questionnaire    Fear of Current or Ex-Partner: No    Emotionally Abused: No    Physically Abused: No    Sexually Abused: No    Outpatient Medications Prior to Visit  Medication Sig Dispense Refill   aspirin EC 81 MG EC tablet Take 1 tablet (81 mg total) by mouth daily. Swallow whole. 30 tablet 11   benzonatate (TESSALON) 200 MG capsule Take 1 capsule (200 mg total) by mouth 3 (three) times daily as  needed for cough. Take as needed for cough 30 capsule 0   budesonide (ENTOCORT EC) 3 MG 24 hr capsule Take 9 mg by mouth daily.     cholecalciferol (VITAMIN D3) 25 MCG (1000 UNIT) tablet Take 10,000 mcg by mouth 4 (four) times a week.     diazepam (VALIUM) 5 MG tablet TAKE 1/2 TO 2 TABLETS(2.5 TO 10 MG) BY MOUTH EVERY 12 HOURS AS  NEEDED FOR ANXIETY 30 tablet 1   ELIQUIS 5 MG TABS tablet TAKE 1 TABLET BY MOUTH TWICE  DAILY 180 tablet 3   Evolocumab (REPATHA SURECLICK) 140 MG/ML SOAJ Inject 140 mg into the skin every 14 (fourteen) days. 6 mL 3   furosemide (LASIX) 20 MG tablet TAKE 1 TABLET BY MOUTH DAILY AS  NEEDED 90 tablet 3   latanoprost (XALATAN) 0.005 % ophthalmic solution Place 1 drop into the left eye at bedtime.      levalbuterol (XOPENEX HFA) 45 MCG/ACT inhaler INHALE 2 PUFFS BY MOUTH EVERY 4 HOURS AS NEEDED FOR WHEEZING 45 g 2   MAGnesium-Oxide 400 (240 Mg) MG tablet Take 1 tablet (400 mg total) by mouth 3 (three) times daily. 270 tablet 1   metoprolol tartrate (LOPRESSOR) 100 MG tablet Take 1 tablet (100 mg total) by mouth 2 (two) times daily. 180 tablet 3   Multiple Vitamins-Minerals (PRESERVISION AREDS 2) CAPS Take 1 tablet by mouth daily.     nitroGLYCERIN (NITROSTAT) 0.4 MG SL tablet Place 1 tablet (0.4 mg total) under the tongue every 5 (five) minutes as needed for chest pain. 30 tablet 0   omeprazole (PRILOSEC OTC) 20 MG tablet Take 20 mg by mouth daily.     Ranibizumab (LUCENTIS IO) Inject 1 Dose into the eye every 6 (six) weeks. Bilateral eye by Dr Marvis Repress     tamsulosin (FLOMAX) 0.4 MG CAPS capsule Take 0.4 mg by mouth 2 (two) times daily.     brimonidine (ALPHAGAN) 0.2 % ophthalmic solution Place 1 drop into the left eye in the morning and at bedtime.   7   doxycycline (VIBRA-TABS) 100 MG tablet Take 1 tablet (100 mg total) by mouth 2 (two) times daily. 14 tablet 0   predniSONE (DELTASONE) 20 MG tablet Take 2 tablets (40 mg total) by mouth daily. 10 tablet 0   No facility-administered medications prior to visit.    Allergies  Allergen Reactions   Albumin (Human) Anaphylaxis   Polymyxin B-Trimethoprim Swelling    Eye drops made eyes swell   Pseudoephedrine Other (See Comments)    Stomach cramps   Codeine Hives, Itching and Rash   Guaiacol Other (See Comments)    Hallucinations   Statins  Other (See Comments)    Muscle cramps    Brimonidine Tartrate Itching and Swelling   Gabapentin Other (See Comments)    Pt unsure of sensitivity   Meloxicam Other (See Comments)    Pt unsure of sensitivity   Peppermint Flavor Other (See Comments)    Severe cramping   Pseudoephedrine-Guaifenesin Nausea And Vomiting    Stomach cramps   Rosuvastatin Calcium Other (See Comments)    Muscle aches   Tapentadol Other (See Comments)    Pt unsure of sensitivity   Ciprofloxacin Hives, Itching, Nausea Only and Rash   Moxifloxacin Nausea Only and Other (See Comments)    Headaches, stomach cramps    Rofecoxib Other (See Comments)    Stomach cramping    Review of Systems  Constitutional:  Negative for chills and fever.  HENT:  Negative for congestion.   Respiratory:  Negative for shortness of breath.   Cardiovascular:  Negative for chest pain and palpitations.  Gastrointestinal:  Negative for abdominal pain, blood in stool, constipation, diarrhea, nausea and vomiting.  Genitourinary:  Negative for dysuria, frequency, hematuria and urgency.  Skin:           Neurological:  Negative for headaches.       Objective:    Physical Exam Constitutional:      General: He is not in acute distress.    Appearance: Normal appearance. He is normal weight. He is not ill-appearing.  HENT:     Head: Normocephalic and atraumatic.     Right Ear: External ear normal.     Left Ear: External ear normal.     Nose: Nose normal.     Mouth/Throat:     Mouth: Mucous membranes are moist.     Pharynx: Oropharynx is clear.  Eyes:     General:        Right eye: No discharge.        Left eye: No discharge.     Extraocular Movements: Extraocular movements intact.     Conjunctiva/sclera: Conjunctivae normal.     Pupils: Pupils are equal, round, and reactive to light.  Cardiovascular:     Rate and Rhythm: Normal rate and regular rhythm.     Pulses: Normal pulses.     Heart sounds: Normal heart sounds. No  murmur heard.    No gallop.  Pulmonary:     Effort: Pulmonary effort is normal. No respiratory distress.     Breath sounds: Normal breath sounds. No wheezing or rales.  Abdominal:     General: Bowel sounds are normal.     Palpations: Abdomen is soft.     Tenderness: There is no abdominal tenderness. There is no guarding.  Musculoskeletal:        General: Normal range of motion.     Cervical back: Normal range of motion.     Right lower leg: No edema.     Left lower leg: No edema.  Skin:    General: Skin is warm and dry.  Neurological:     Mental Status: He is alert and oriented to person, place, and time.  Psychiatric:        Mood and Affect: Mood normal.        Behavior: Behavior normal.        Judgment: Judgment normal.     BP 130/72 (BP Location: Right Arm, Patient Position: Sitting, Cuff Size: Normal)   Pulse (!) 114   Temp 98 F (36.7 C) (Oral)   Resp 16   Ht 5\' 4"  (1.626 m)   Wt 177 lb 3.2 oz (80.4 kg)   SpO2 97%   BMI 30.42 kg/m  Wt Readings from Last 3 Encounters:  10/27/22 177 lb 3.2 oz (80.4 kg)  10/09/22 184 lb 3.2 oz (83.6 kg)  10/02/22 172 lb (78 kg)    Diabetic Foot Exam - Simple   No data filed    Lab Results  Component Value Date   WBC 8.2 10/27/2022   HGB 11.4 (L) 10/27/2022   HCT 34.0 (L) 10/27/2022   PLT 374.0 10/27/2022   GLUCOSE 94 10/27/2022   CHOL 109 10/27/2022   TRIG 98.0 10/27/2022   HDL 43.00 10/27/2022   LDLDIRECT 134.2 12/01/2013   LDLCALC 47 10/27/2022   ALT 10 10/27/2022   AST 16 10/27/2022   NA 138 10/27/2022  K 4.9 10/27/2022   CL 102 10/27/2022   CREATININE 0.90 10/27/2022   BUN 25 (H) 10/27/2022   CO2 25 10/27/2022   TSH 1.33 10/27/2022   PSA 1.80 03/04/2021   INR 1.4 (H) 07/22/2021   HGBA1C 5.5 10/27/2022    Lab Results  Component Value Date   TSH 1.33 10/27/2022   Lab Results  Component Value Date   WBC 8.2 10/27/2022   HGB 11.4 (L) 10/27/2022   HCT 34.0 (L) 10/27/2022   MCV 88.5 10/27/2022   PLT  374.0 10/27/2022   Lab Results  Component Value Date   NA 138 10/27/2022   K 4.9 10/27/2022   CO2 25 10/27/2022   GLUCOSE 94 10/27/2022   BUN 25 (H) 10/27/2022   CREATININE 0.90 10/27/2022   BILITOT 0.8 10/27/2022   ALKPHOS 55 10/27/2022   AST 16 10/27/2022   ALT 10 10/27/2022   PROT 6.9 10/27/2022   ALBUMIN 3.8 10/27/2022   CALCIUM 8.6 10/27/2022   ANIONGAP 10 10/02/2022   EGFR 57 (L) 08/12/2021   GFR 83.38 10/27/2022   Lab Results  Component Value Date   CHOL 109 10/27/2022   Lab Results  Component Value Date   HDL 43.00 10/27/2022   Lab Results  Component Value Date   LDLCALC 47 10/27/2022   Lab Results  Component Value Date   TRIG 98.0 10/27/2022   Lab Results  Component Value Date   CHOLHDL 3 10/27/2022   Lab Results  Component Value Date   HGBA1C 5.5 10/27/2022      Assessment & Plan:  Immunizations: Reviewed patient's immunization history.  Labs: Routine blood work today will also check iron levels. Problem List Items Addressed This Visit     CAD (coronary artery disease)    Asymptomatic. No changes      CRP elevated   Relevant Orders   CRP High sensitivity (Completed)   Diarrhea    He recently had some antibiotics and he is experiencing loose stool again. He is back up to Budesonide 9 mg again and he has stopped his iron. He is done with the ABX now and is advised to take the probiotic for one more week then stop      Essential hypertension - Primary    Well controlled, no changes to meds. Encouraged heart healthy diet such as the DASH diet and exercise as tolerated.       Relevant Orders   CRP High sensitivity (Completed)   CBC with Differential/Platelet (Completed)   Comprehensive metabolic panel (Completed)   TSH (Completed)   Heart failure with preserved ejection fraction (HCC)    No recent exacerbation      Relevant Orders   CRP High sensitivity (Completed)   Hyperglycemia    hgba1c acceptable, minimize simple carbs. Increase  exercise as tolerated.       Relevant Orders   Hemoglobin A1c (Completed)   Hyperlipidemia, mixed    Encourage heart healthy diet such as MIND or DASH diet, increase exercise, avoid trans fats, simple carbohydrates and processed foods, consider a krill or fish or flaxseed oil cap daily. Does not tolerate statins, is tolerating Repatha      Relevant Orders   Lipid panel (Completed)   Iron deficiency anemia   Relevant Orders   Iron, TIBC and Ferritin Panel (Completed)   Persistent atrial fibrillation (HCC)    Rate controlled tolerating meds      Prostate cancer (HCC)    His PSA is suppressed and he feels  well, he is very stressed he reports he is responding to treatment      Vitamin D deficiency    Supplement and monitor       Other Visit Diagnoses     High risk medication use       Relevant Orders   Drug Monitoring Panel [phone] , Urine (Completed)      No orders of the defined types were placed in this encounter.  I, Danise Edge, MD, personally preformed the services described in this documentation.  All medical record entries made by the scribe were at my direction and in my presence.  I have reviewed the chart and discharge instructions (if applicable) and agree that the record reflects my personal performance and is accurate and complete. 10/27/2022  I,Mohammed Iqbal,acting as a scribe for Danise Edge, MD.,have documented all relevant documentation on the behalf of Danise Edge, MD,as directed by  Danise Edge, MD while in the presence of Danise Edge, MD.  Danise Edge, MD

## 2022-10-27 NOTE — Assessment & Plan Note (Signed)
He recently had some antibiotics and he is experiencing loose stool again. He is back up to Budesonide 9 mg again and he has stopped his iron. He is done with the ABX now and is advised to take the probiotic for one more week then stop

## 2022-10-27 NOTE — Assessment & Plan Note (Addendum)
His PSA is suppressed and he feels well, he is very stressed he reports he is responding to treatment

## 2022-10-28 ENCOUNTER — Encounter: Payer: Self-pay | Admitting: Family Medicine

## 2022-10-28 LAB — COMPREHENSIVE METABOLIC PANEL
ALT: 10 U/L (ref 0–53)
AST: 16 U/L (ref 0–37)
Albumin: 3.8 g/dL (ref 3.5–5.2)
Alkaline Phosphatase: 55 U/L (ref 39–117)
BUN: 25 mg/dL — ABNORMAL HIGH (ref 6–23)
CO2: 25 mEq/L (ref 19–32)
Calcium: 8.6 mg/dL (ref 8.4–10.5)
Chloride: 102 mEq/L (ref 96–112)
Creatinine, Ser: 0.9 mg/dL (ref 0.40–1.50)
GFR: 83.38 mL/min (ref >60.00)
Glucose, Bld: 94 mg/dL (ref 70–99)
Potassium: 4.9 mEq/L (ref 3.5–5.1)
Sodium: 138 mEq/L (ref 135–145)
Total Bilirubin: 0.8 mg/dL (ref 0.2–1.2)
Total Protein: 6.9 g/dL (ref 6.0–8.3)

## 2022-10-28 LAB — CBC WITH DIFFERENTIAL/PLATELET
Basophils Absolute: 0.1 10*3/uL (ref 0.0–0.1)
Basophils Relative: 0.9 % (ref 0.0–3.0)
Eosinophils Absolute: 0.2 10*3/uL (ref 0.0–0.7)
Eosinophils Relative: 1.9 % (ref 0.0–5.0)
HCT: 34 % — ABNORMAL LOW (ref 39.0–52.0)
Hemoglobin: 11.4 g/dL — ABNORMAL LOW (ref 13.0–17.0)
Lymphocytes Relative: 13.7 % (ref 12.0–46.0)
Lymphs Abs: 1.1 10*3/uL (ref 0.7–4.0)
MCHC: 33.6 g/dL (ref 30.0–36.0)
MCV: 88.5 fl (ref 78.0–100.0)
Monocytes Absolute: 0.8 10*3/uL (ref 0.1–1.0)
Monocytes Relative: 9.6 % (ref 3.0–12.0)
Neutro Abs: 6.1 10*3/uL (ref 1.4–7.7)
Neutrophils Relative %: 73.9 % (ref 43.0–77.0)
Platelets: 374 10*3/uL (ref 150.0–400.0)
RBC: 3.84 Mil/uL — ABNORMAL LOW (ref 4.22–5.81)
RDW: 15.7 % — ABNORMAL HIGH (ref 11.5–15.5)
WBC: 8.2 10*3/uL (ref 4.0–10.5)

## 2022-10-28 LAB — LIPID PANEL
Cholesterol: 109 mg/dL (ref 0–200)
HDL: 43 mg/dL (ref >39.00)
LDL Cholesterol: 47 mg/dL (ref 0–99)
NonHDL: 66.36
Total CHOL/HDL Ratio: 3
Triglycerides: 98 mg/dL (ref 0.0–149.0)
VLDL: 19.6 mg/dL (ref 0.0–40.0)

## 2022-10-28 LAB — HEMOGLOBIN A1C: Hgb A1c MFr Bld: 5.5 % (ref 4.6–6.5)

## 2022-10-28 LAB — IRON,TIBC AND FERRITIN PANEL
%SAT: 17 % (calc) — ABNORMAL LOW (ref 20–48)
Ferritin: 101 ng/mL (ref 24–380)
Iron: 49 ug/dL — ABNORMAL LOW (ref 50–180)
TIBC: 295 mcg/dL (calc) (ref 250–425)

## 2022-10-28 LAB — HIGH SENSITIVITY CRP: CRP, High Sensitivity: 9.4 mg/L — ABNORMAL HIGH (ref 0.000–5.000)

## 2022-10-28 LAB — TSH: TSH: 1.33 u[IU]/mL (ref 0.35–5.50)

## 2022-10-29 LAB — DRUG MONITORING PANEL 376104, URINE
Alphahydroxyalprazolam: NEGATIVE ng/mL (ref <25)
Alphahydroxymidazolam: NEGATIVE ng/mL (ref <50)
Alphahydroxytriazolam: NEGATIVE ng/mL (ref <50)
Aminoclonazepam: NEGATIVE ng/mL (ref <25)
Amphetamines: NEGATIVE ng/mL (ref <500)
Barbiturates: NEGATIVE ng/mL (ref <300)
Benzodiazepines: POSITIVE ng/mL — AB (ref <100)
Cocaine Metabolite: NEGATIVE ng/mL (ref <150)
Desmethyltramadol: NEGATIVE ng/mL (ref <100)
Hydroxyethylflurazepam: NEGATIVE ng/mL (ref <50)
Lorazepam: NEGATIVE ng/mL (ref <50)
Nordiazepam: 110 ng/mL — ABNORMAL HIGH (ref <50)
Opiates: NEGATIVE ng/mL (ref <100)
Oxazepam: 665 ng/mL — ABNORMAL HIGH (ref <50)
Oxycodone: NEGATIVE ng/mL (ref <100)
Temazepam: 310 ng/mL — ABNORMAL HIGH (ref <50)
Tramadol: NEGATIVE ng/mL (ref <100)

## 2022-10-29 LAB — DM TEMPLATE

## 2022-10-30 DIAGNOSIS — R7982 Elevated C-reactive protein (CRP): Secondary | ICD-10-CM | POA: Insufficient documentation

## 2022-10-30 DIAGNOSIS — D509 Iron deficiency anemia, unspecified: Secondary | ICD-10-CM | POA: Insufficient documentation

## 2022-10-30 NOTE — Assessment & Plan Note (Signed)
Asymptomatic. No changes

## 2022-10-30 NOTE — Assessment & Plan Note (Signed)
Supplement and monitor 

## 2022-10-30 NOTE — Assessment & Plan Note (Signed)
Rate controlled tolerating meds

## 2022-11-10 ENCOUNTER — Other Ambulatory Visit: Payer: Self-pay | Admitting: Family Medicine

## 2022-11-10 DIAGNOSIS — R790 Abnormal level of blood mineral: Secondary | ICD-10-CM

## 2022-11-18 DIAGNOSIS — C61 Malignant neoplasm of prostate: Secondary | ICD-10-CM | POA: Diagnosis not present

## 2022-11-21 ENCOUNTER — Other Ambulatory Visit: Payer: Self-pay | Admitting: Gastroenterology

## 2022-11-23 ENCOUNTER — Telehealth: Payer: Self-pay

## 2022-11-23 NOTE — Telephone Encounter (Signed)
Called patient to schedule f/u appointment with Dr. Bryan Lemma. Patient was last seen by him on 02/09/22. Pharmacy is requesting refill for Budesonide. Need to confirm how patient is taking his medication before we sent to the pharmacy.  Dr. Bryan Lemma stated,  "The last we talked, he was at 6 mg and holding there for a while. If he is still at 6 mg, stay there until follow-up appt. If he has decreased to 3 mg and feeling well, then refill at 3 mg and hold there until follow-up appt. Thanks! "  Will continue effort to call the patient.

## 2022-11-24 NOTE — Telephone Encounter (Signed)
Called patient and Left VM on his Cell and Home phone both in regards to refill his medication and to make follow up appointment with Dr. Bryan Lemma in May.

## 2022-11-24 NOTE — Telephone Encounter (Signed)
Patient returned call, he stated Ryan [name] was a bad month and wanted to schedule in April. Patient was scheduled for 4/18 at 1:20, and is requesting his Budesonide be filled. Please advise.

## 2022-11-24 NOTE — Telephone Encounter (Signed)
Confirmed with patient that he is doing fine with  Budesonide 6 mg daily. Sent 90 days prescription to his pharmacy.

## 2022-11-25 DIAGNOSIS — C61 Malignant neoplasm of prostate: Secondary | ICD-10-CM | POA: Diagnosis not present

## 2022-12-10 DIAGNOSIS — Z8673 Personal history of transient ischemic attack (TIA), and cerebral infarction without residual deficits: Secondary | ICD-10-CM | POA: Diagnosis not present

## 2022-12-10 DIAGNOSIS — H26493 Other secondary cataract, bilateral: Secondary | ICD-10-CM | POA: Diagnosis not present

## 2022-12-10 DIAGNOSIS — H401122 Primary open-angle glaucoma, left eye, moderate stage: Secondary | ICD-10-CM | POA: Diagnosis not present

## 2022-12-10 DIAGNOSIS — Z961 Presence of intraocular lens: Secondary | ICD-10-CM | POA: Diagnosis not present

## 2022-12-10 DIAGNOSIS — H353231 Exudative age-related macular degeneration, bilateral, with active choroidal neovascularization: Secondary | ICD-10-CM | POA: Diagnosis not present

## 2022-12-23 DIAGNOSIS — M25511 Pain in right shoulder: Secondary | ICD-10-CM | POA: Diagnosis not present

## 2022-12-28 DIAGNOSIS — H26492 Other secondary cataract, left eye: Secondary | ICD-10-CM | POA: Diagnosis not present

## 2023-01-01 DIAGNOSIS — M25511 Pain in right shoulder: Secondary | ICD-10-CM | POA: Diagnosis not present

## 2023-01-20 DIAGNOSIS — M25511 Pain in right shoulder: Secondary | ICD-10-CM | POA: Diagnosis not present

## 2023-01-21 ENCOUNTER — Encounter: Payer: Self-pay | Admitting: Gastroenterology

## 2023-01-21 ENCOUNTER — Ambulatory Visit (INDEPENDENT_AMBULATORY_CARE_PROVIDER_SITE_OTHER): Payer: Medicare Other | Admitting: Gastroenterology

## 2023-01-21 VITALS — BP 126/78 | HR 72 | Ht 64.0 in | Wt 182.0 lb

## 2023-01-21 DIAGNOSIS — Z8673 Personal history of transient ischemic attack (TIA), and cerebral infarction without residual deficits: Secondary | ICD-10-CM | POA: Diagnosis not present

## 2023-01-21 DIAGNOSIS — K529 Noninfective gastroenteritis and colitis, unspecified: Secondary | ICD-10-CM

## 2023-01-21 DIAGNOSIS — K52832 Lymphocytic colitis: Secondary | ICD-10-CM | POA: Diagnosis not present

## 2023-01-21 DIAGNOSIS — H353231 Exudative age-related macular degeneration, bilateral, with active choroidal neovascularization: Secondary | ICD-10-CM | POA: Diagnosis not present

## 2023-01-21 DIAGNOSIS — K219 Gastro-esophageal reflux disease without esophagitis: Secondary | ICD-10-CM | POA: Diagnosis not present

## 2023-01-21 DIAGNOSIS — Z961 Presence of intraocular lens: Secondary | ICD-10-CM | POA: Diagnosis not present

## 2023-01-21 DIAGNOSIS — Z9889 Other specified postprocedural states: Secondary | ICD-10-CM | POA: Diagnosis not present

## 2023-01-21 DIAGNOSIS — Z7901 Long term (current) use of anticoagulants: Secondary | ICD-10-CM | POA: Diagnosis not present

## 2023-01-21 DIAGNOSIS — H26492 Other secondary cataract, left eye: Secondary | ICD-10-CM | POA: Diagnosis not present

## 2023-01-21 DIAGNOSIS — H401122 Primary open-angle glaucoma, left eye, moderate stage: Secondary | ICD-10-CM | POA: Diagnosis not present

## 2023-01-21 NOTE — Progress Notes (Signed)
Chief Complaint:    Lymphocytic Colitis, medication refill  GI History: 77 y.o. male with a history of CAD s/p CABG, COPD, CHF, GERD, HTN, hyperlipidemia, nephrolithiasis, osteoporosis, atrial fibrillation, cardioversion 2019 with recurrence (on Eliquis), prostate cancer s/p radiation/surgery/chemotherapy, cholecystectomy, initially seen in the GI clinic 12/22/2020 for evaluation of chronic diarrhea c/b hospital admissions for electrolyte abnormalities, eventually diagnosed with Lymphocytic Colitis as below.   -04/2020: Acute onset diarrhea with 6-day hospitalization for electrolyte abnormalities, fatigue -04/2020: CT A/P: Sigmoid diverticulosis without diverticulitis, gastric wall thickening -08/2020: Hospital admission for hypomagnesemia, hypokalemia, ongoing diarrhea.  Discharged with cholestyramine, Imodium, oral potassium and magnesium supplements -08/2020: Follow-up with Dr. Kinnie Scales.  Fecal calprotectin mildly elevated at 110. Pancreatic elastase very mildly reduced at 192.  Lactoferrin normal, FOBT-, C diff-, GI PCR panel-. Started on budesonide 9 mg/day, but only tolerated x2 weeks and discontinued. -Start Lomotil in 08/2020 with some improvement -09/2020: Follow-up with Dr. Kinnie Scales.  Was doing okay with Questran bid, but breakthrough recurrent loose stools when he tried to titrate down to 1 packet/day.  Recommended colonoscopy, but this was never scheduled -12/2020: Hospital admission for hypomagnesemia (0.8), dizziness.  Improved with IVF and mag supplementation - 12/17/2020: Initial appointment with me.  Some improvement with daily cholestyramine and Lomotil QPM. - 12/2020: Negative/normal Giardia, fecal elastase, CMP.  Mag 1.5 started on mag supplement. Was sent for SIBO testing-  - 01/2021: EGD/colonoscopy as outlined below - 02/09/2022: GI follow-up.  Diarrhea essentially resolved with budesonide.  Had breakthrough symptoms shortly after tapering off, so treated with prolonged course with  consideration for adding cholestyramine back if breakthrough at low-dose   Endoscopic History: -Colonoscopy (10/2017, Dr. Kinnie Scales): 8 mm splenic flexure polyp, 2 cm pedunculated sigmoid polyp, left-sided diverticulosis, large internal hemorrhoids.  Normal TI -EGD (08/2019, Dr. Kinnie Scales): 2 cm HH, otherwise normal -Colonoscopy (08/2019, Dr. Kinnie Scales): 5 mm ascending polyp, left-sided diverticulosis, large internal hemorrhoids.  Normal TI - EGD (01/2021, Dr. Barron Alvine): Fundic gland polyps, mild gastritis.  Normal duodenal biopsies - Colonoscopy (01/2021, Dr. Barron Alvine): 2 mm benign sigmoid polyp, sigmoid diverticulosis, internal hemorrhoids.  Normal mucosa but biopsies n/f Lymphocytic Colitis.  Started on budesonide  HPI:     Patient is a 77 y.o. male presenting to the Gastroenterology Clinic for follow-up.   Feeling well, but has been unable to titrate off the budesonide.  Currently taking 6 mg daily, which controls his symptoms very well.  Tends to have some breakthrough symptoms at 3 mg (intermittent loose stools), and breakthrough within a few days after stopping completely.  Good capture of control after resuming therapy.      Latest Ref Rng & Units 10/27/2022    3:46 PM 10/02/2022   10:52 AM 01/21/2022    4:24 AM  CMP  Glucose 70 - 99 mg/dL 94  161  096   BUN 6 - 23 mg/dL 25  19  30    Creatinine 0.40 - 1.50 mg/dL 0.45  4.09  8.11   Sodium 135 - 145 mEq/L 138  135  136   Potassium 3.5 - 5.1 mEq/L 4.9  3.7  4.6   Chloride 96 - 112 mEq/L 102  102  100   CO2 19 - 32 mEq/L 25  23  27    Calcium 8.4 - 10.5 mg/dL 8.6  8.2  9.4   Total Protein 6.0 - 8.3 g/dL 6.9     Total Bilirubin 0.2 - 1.2 mg/dL 0.8     Alkaline Phos 39 - 117 U/L 55  AST 0 - 37 U/L 16     ALT 0 - 53 U/L 10         Latest Ref Rng & Units 10/27/2022    3:46 PM 10/02/2022   10:52 AM 01/21/2022    4:24 AM  CBC  WBC 4.0 - 10.5 K/uL 8.2  8.0  8.0   Hemoglobin 13.0 - 17.0 g/dL 16.1  09.6  04.5   Hematocrit 39.0 - 52.0 %  34.0  32.7  30.4   Platelets 150.0 - 400.0 K/uL 374.0  214  304      Review of systems:     No chest pain, no SOB, no fevers, no urinary sx   Past Medical History:  Diagnosis Date   Anemia    Anticoagulant long-term use    eliquis--- mananged by cardiology   BPH with urinary obstruction    CAD (coronary artery disease)    cardiologist--- dr g. taylor--- cath 04-22-2011  moderate LM stenosis, borderline sig pRCA, sig stenosis mid to distal LAD ;  aggressive medical therapy;   cath 07-21-2021 for chest pain w/ ST changes,  severe disease;  s/p CABG x4 07-22-2021   Chronic diastolic CHF (congestive heart failure) 03/2018   followed by cardiology   CKD (chronic kidney disease), stage III    COPD (chronic obstructive pulmonary disease)    followed by pcp   DOE (dyspnea on exertion)    per pt when walk >100yds,  per pt rides outside bike 4-8 miles daily without sob,  household chores and yard work without sob   Eczema    GERD (gastroesophageal reflux disease)    Glaucoma, left eye    Heart murmur    Hemorrhoids    History of adenomatous polyp of colon    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 Barron Alvine--- dx by biopsy 01-28-2021   Macular degeneration of both eyes    followed by dr c. Gwendalyn Ege--- bilateral eye injection every 6 wks   Malignant neoplasm prostate 07/2021   urologist-- dr Retta Diones;  dx 10/ 2022,  Gleason 5+5, PSA 1.8    Osteoporosis 05/31/2016   PAF (paroxysmal atrial fibrillation) 03/2012   cardiologist-- dr g. taylor;  first dx 06/ 2013 AFlutter w/ RVR   Vitamin D deficiency     Patient's surgical history, family medical history, social history, medications and allergies were all reviewed in Epic    Current Outpatient Medications  Medication Sig Dispense Refill   aspirin EC 81 MG EC tablet Take 1 tablet (81 mg total) by mouth daily. Swallow whole. 30 tablet 11   budesonide (ENTOCORT EC) 3 MG 24 hr capsule Take 2 capsules (6 mg total) by mouth daily. 180 capsule 0   cholecalciferol (VITAMIN D3) 25 MCG (1000 UNIT) tablet Take 10,000 mcg by mouth  4 (four) times a week.     diazepam (VALIUM) 5 MG tablet TAKE 1/2 TO 2 TABLETS(2.5 TO 10 MG) BY MOUTH EVERY 12 HOURS AS NEEDED FOR ANXIETY 30 tablet 1   ELIQUIS 5 MG TABS tablet TAKE 1 TABLET BY MOUTH TWICE  DAILY 180 tablet 3   Evolocumab (REPATHA SURECLICK) 140 MG/ML SOAJ Inject 140 mg into the skin every 14 (fourteen) days. 6 mL 3   furosemide (LASIX) 20 MG tablet TAKE 1 TABLET BY MOUTH DAILY AS  NEEDED 90 tablet 3   latanoprost (XALATAN) 0.005 % ophthalmic solution Place 1 drop into the left eye at bedtime.      levalbuterol (XOPENEX HFA) 45 MCG/ACT inhaler INHALE 2 PUFFS BY MOUTH EVERY 4 HOURS AS NEEDED FOR WHEEZING 45 g 2   magnesium oxide (MAG-OX) 400 (240 Mg) MG tablet TAKE 1 TABLET(400 MG) BY MOUTH THREE TIMES DAILY 270 tablet 1   metoprolol tartrate (LOPRESSOR) 100 MG tablet Take 1 tablet (100 mg total) by mouth 2 (two) times daily. 180 tablet 3   Multiple Vitamins-Minerals (PRESERVISION AREDS 2) CAPS Take 1 tablet by mouth daily.     nitroGLYCERIN (NITROSTAT) 0.4 MG SL tablet Place 1 tablet (0.4 mg total) under the tongue every 5 (five) minutes as needed for chest pain. 30 tablet 0   omeprazole (PRILOSEC OTC) 20 MG tablet Take 20 mg by mouth daily.     Ranibizumab (LUCENTIS IO) Inject 1 Dose into the eye every 6 (six) weeks. Bilateral eye by Dr Marvis Repress     tamsulosin (FLOMAX) 0.4 MG CAPS capsule Take 0.4 mg by mouth 2 (two) times daily.     benzonatate (TESSALON) 200 MG capsule Take 1 capsule (200 mg total) by mouth 3 (three) times daily as needed for cough. Take as needed for cough (Patient not taking: Reported on 01/21/2023) 30 capsule 0   No current facility-administered medications for this visit.    Physical Exam:     BP 126/78   Pulse 72   Ht 5\' 4"  (1.626 m)   Wt 182 lb (82.6 kg)   BMI 31.24 kg/m   GENERAL:  Pleasant male in NAD PSYCH: : Cooperative, normal affect NEURO: Alert and oriented x 3, no focal neurologic deficits   IMPRESSION and PLAN:    1) Lymphocytic Colitis Unable to titrate off budesonide as he has mild intermittent symptoms at 3 mg/day, and breakthrough within a couple days of complete cessation.  Symptoms well-controlled at 6 mg daily.  We had a long conversation today regarding the risk/benefit profile of long-term budesonide use.  He is not a candidate for immunomodulators therapy given active cancer treatment.  No response to cholestyramine in the past.  Discussed potentially low-dose budesonide and adding cholestyramine, but he is not in favor of this approach given lack of response to cholestyramine the past.  After a thorough discussion, he is strongly in favor of continued budesonide 6 mg daily for prolonged period of time and feels the benefits far outweigh the risks.  - Refilled budesonide 6 mg daily  2) GERD - Well-controlled with Prilosec 20 mg daily - Had previously discussed potential relationship of PPI and Lymphocytic Colitis, but no improvement with previous trial of H2 blocker, so patient would like to continue with PPI per prior discussions - Continue antireflux lifestyle/dietary modifications - I suppose it is conceivable that we could get him on Voquenza which was recently approved and not associated with [facility].  Can discuss the possibility of follow-up  RTC in 1 year or sooner  prn            Shellia Cleverly ,DO, FACG 01/21/2023, 1:25 PM

## 2023-01-21 NOTE — Patient Instructions (Addendum)
Per your request we didn't send refill for Budesonide 6 MG. Let us know when you need refills.  Please follow up in 12 months. Give [place] a call at [phone] to schedule an appointment.   _______________________________________________________  If your blood pressure at your visit was 140/90 or greater, please contact your primary care physician to follow up on this.  _______________________________________________________  If you are age 76 or older, your body mass index should be between 23-30. Your Body mass index is 31.24 kg/m. If this is out of the aforementioned range listed, please consider follow up with your Primary Care Provider.  __________________________________________________________  The [facility] providers would like to encourage you to use [facility] to communicate with providers for non-urgent requests or questions.  Due to long hold times on the telephone, sending your provider a message by [facility] may be a faster and more efficient way to get a response.  Please allow 48 business hours for a response.  Please remember that this is for non-urgent requests.   Due to recent changes in healthcare laws, you may see the results of your imaging and laboratory studies on MyChart before your provider has had a chance to review them.  We understand that in some cases there may be results that are confusing or concerning to you. Not all laboratory results come back in the same time frame and the provider may be waiting for multiple results in order to interpret others.  Please give [place] 48 hours in order for your provider to thoroughly review all the results before contacting the office for clarification of your results.    Thank you for choosing me and [facility].  Doristine Locks, D.O. Call us

## 2023-02-06 NOTE — Assessment & Plan Note (Signed)
Supplement and monitor 

## 2023-02-06 NOTE — Assessment & Plan Note (Signed)
Avoid offending foods, start probiotics. Do not eat large meals in late evening and consider raising head of bed.  

## 2023-02-06 NOTE — Assessment & Plan Note (Signed)
On Repatha 

## 2023-02-06 NOTE — Assessment & Plan Note (Signed)
No recent exacerbation, will monitor

## 2023-02-06 NOTE — Assessment & Plan Note (Signed)
Well controlled, no changes to meds. Encouraged heart healthy diet such as the DASH diet and exercise as tolerated.  °

## 2023-02-06 NOTE — Assessment & Plan Note (Signed)
hgba1c acceptable, minimize simple carbs. Increase exercise as tolerated.  

## 2023-02-06 NOTE — Assessment & Plan Note (Signed)
Encourage heart healthy diet such as MIND or DASH diet, increase exercise, avoid trans fats, simple carbohydrates and processed foods, consider a krill or fish or flaxseed oil cap daily. Does not tolerate statins, is tolerating Repatha 

## 2023-02-08 ENCOUNTER — Ambulatory Visit (HOSPITAL_BASED_OUTPATIENT_CLINIC_OR_DEPARTMENT_OTHER)
Admission: RE | Admit: 2023-02-08 | Discharge: 2023-02-08 | Disposition: A | Discharge status: Home / Self Care | Payer: Medicare Other | Source: Ambulatory Visit | Attending: Family Medicine | Admitting: Family Medicine

## 2023-02-08 ENCOUNTER — Ambulatory Visit (INDEPENDENT_AMBULATORY_CARE_PROVIDER_SITE_OTHER): Payer: Medicare Other | Admitting: Family Medicine

## 2023-02-08 ENCOUNTER — Encounter: Payer: Self-pay | Admitting: Family Medicine

## 2023-02-08 VITALS — BP 128/74 | HR 72 | Temp 97.5°F | Resp 16 | Ht 64.0 in | Wt 182.0 lb

## 2023-02-08 DIAGNOSIS — I5032 Chronic diastolic (congestive) heart failure: Secondary | ICD-10-CM | POA: Diagnosis not present

## 2023-02-08 DIAGNOSIS — M5442 Lumbago with sciatica, left side: Secondary | ICD-10-CM | POA: Diagnosis not present

## 2023-02-08 DIAGNOSIS — M8588 Other specified disorders of bone density and structure, other site: Secondary | ICD-10-CM | POA: Diagnosis not present

## 2023-02-08 DIAGNOSIS — Z789 Other specified health status: Secondary | ICD-10-CM

## 2023-02-08 DIAGNOSIS — E559 Vitamin D deficiency, unspecified: Secondary | ICD-10-CM | POA: Diagnosis not present

## 2023-02-08 DIAGNOSIS — E782 Mixed hyperlipidemia: Secondary | ICD-10-CM | POA: Diagnosis not present

## 2023-02-08 DIAGNOSIS — M5441 Lumbago with sciatica, right side: Secondary | ICD-10-CM | POA: Insufficient documentation

## 2023-02-08 DIAGNOSIS — I10 Essential (primary) hypertension: Secondary | ICD-10-CM

## 2023-02-08 DIAGNOSIS — C61 Malignant neoplasm of prostate: Secondary | ICD-10-CM | POA: Diagnosis not present

## 2023-02-08 DIAGNOSIS — M810 Age-related osteoporosis without current pathological fracture: Secondary | ICD-10-CM

## 2023-02-08 DIAGNOSIS — R739 Hyperglycemia, unspecified: Secondary | ICD-10-CM | POA: Diagnosis not present

## 2023-02-08 DIAGNOSIS — K219 Gastro-esophageal reflux disease without esophagitis: Secondary | ICD-10-CM

## 2023-02-08 DIAGNOSIS — M545 Low back pain, unspecified: Secondary | ICD-10-CM | POA: Diagnosis not present

## 2023-02-08 MED ORDER — DIAZEPAM 5 MG PO TABS: ORAL_TABLET | ORAL | 1 refills | Status: DC

## 2023-02-08 NOTE — Assessment & Plan Note (Signed)
Encouraged to get adequate exercise, calcium and vitamin d intake 

## 2023-02-08 NOTE — Assessment & Plan Note (Signed)
Following with urology doing well

## 2023-02-08 NOTE — Assessment & Plan Note (Addendum)
Encouraged moist heat and gentle stretching as tolerated. May try NSAIDs and prescription meds as directed and report if symptoms worsen or seek immediate care long history of back pain with radicular symptoms. Previously worked with Dr Newell Coral but he has retired tried seeking care at [facility] but tried IT trainer and does not want to go back there for care. Will refer to [facility] neurosurg, xray today

## 2023-02-08 NOTE — Patient Instructions (Signed)

## 2023-02-08 NOTE — Progress Notes (Signed)
Subjective:   By signing my name below, I, Barrett Shell, attest that this documentation has been prepared under the direction and in the presence of [name], MD. 02/08/2023   Patient ID: Ryan [name], male    DOB: Mar 13, 1946, 77 y.o.   MRN: [id number]  Chief Complaint  Patient presents with   Follow-up    Follow up    HPI Patient is in today for a follow-up appointment.   Back pain He complained of hip pain and got a MRI which showed no abnormalities. He complains of pain and weakness in the lower back that radiates down his hips and legs. He is looking to find a new neurosurgeon. No recent fall or trauma  Right shoulder pain He complains of right shoulder pain. He gets a steroid injection every four months to relieve the pain.  Skin He is getting a biopsy on a spot on his left cheek.   Prostate  He is taking hormones for his prostate from when he previously had prostate cancer.   MRI He has metal in both his chest and right knee.   Past Medical History:  Diagnosis Date   Anemia    Anticoagulant long-term use    eliquis--- mananged by cardiology   BPH with urinary obstruction    CAD (coronary artery disease)    cardiologist--- dr g. taylor--- cath 04-22-2011  moderate LM stenosis, borderline sig pRCA, sig stenosis mid to distal LAD ;  aggressive medical therapy;   cath 07-21-2021 for chest pain w/ ST changes,  severe disease;  s/p CABG x4 07-22-2021   Chronic diastolic CHF (congestive heart failure) (HCC) 03/2018   followed by cardiology   CKD (chronic kidney disease), stage III (HCC)    COPD (chronic obstructive pulmonary disease) (HCC)    followed by pcp   DOE (dyspnea on exertion)    per pt when walk >100yds,  per pt rides outside bike 4-8 miles daily without sob,  household chores and yard work without sob   Eczema    GERD (gastroesophageal reflux disease)    Glaucoma, left eye    Heart murmur    Hemorrhoids    History of adenomatous polyp of colon     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 Barron Alvine--- dx by biopsy 01-28-2021   Macular degeneration of both eyes    followed by dr c. 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 taylor;  first dx 06/ 2013 AFlutter w/ RVR   Vitamin D deficiency     Past Surgical History:  Procedure Laterality  Date   ANTERIOR CERVICAL DECOMP/DISCECTOMY FUSION  06/08/2002   @[facility] ;  C5--C7   APPENDECTOMY  1953   CARDIAC CATHETERIZATION  04/22/2011   moderate left main and RCA stenosis not significant by FFR and IVUS on medical therapy   CARDIOVERSION N/A 04/25/2018   Procedure: CARDIOVERSION;  Surgeon: Chilton Si, MD;  Location: [facility] ENDOSCOPY;  Service: Cardiovascular;  Laterality: N/A;   CATARACT EXTRACTION W/ INTRAOCULAR LENS IMPLANT Bilateral 2021   COLONOSCOPY WITH ESOPHAGOGASTRODUODENOSCOPY (EGD)  01/28/2021   by [email or web address]   CORONARY ARTERY BYPASS GRAFT N/A 07/22/2021   Procedure: 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, ON PUMP, USING LEFT INTERNAL MAMMARY ARTERY AND ENDOSCOPICALLY HARVESTED RIGHT GREATER SAPHENOUS VEIN;  Surgeon: Loreli Slot, MD;  Location: [facility] OR;  Service: Open Heart Surgery;  Laterality: N/A;    ENDOVEIN HARVEST OF GREATER SAPHENOUS VEIN  07/22/2021   Procedure: ENDOVEIN HARVEST OF GREATER SAPHENOUS VEIN;  Surgeon: Loreli Slot, MD;  Location: [facility] OR;  Service: Open Heart Surgery;;   EXTRACORPOREAL SHOCK WAVE LITHOTRIPSY     x2  1990s   FOOT SURGERY Right 1970   calcification removed from top of foot   GOLD SEED IMPLANT N/A 11/28/2021   Procedure: GOLD SEED IMPLANT;  Surgeon: Jerilee Field, MD;  Location[facility];  Service: Urology;  Laterality: N/A;   KNEE ARTHROSCOPY Right 1991   LAPAROSCOPIC CHOLECYSTECTOMY  2000   LEFT HEART CATH AND CORONARY ANGIOGRAPHY N/A 07/21/2021   Procedure: LEFT HEART CATH AND CORONARY ANGIOGRAPHY;  Surgeon: Runell Gess, MD;  Location: [facility] INVASIVE CV LAB;  Service: Cardiovascular;  Laterality: N/A;   SPACE OAR INSTILLATION N/A 11/28/2021   Procedure: SPACE OAR INSTILLATION;  Surgeon: Jerilee Field, MD;  Location: [facility];  Service: Urology;  Laterality: N/A;   TEE WITHOUT CARDIOVERSION N/A 07/22/2021   Procedure: TRANSESOPHAGEAL ECHOCARDIOGRAM (TEE);  Surgeon: Loreli Slot, MD;  Location: [facility] OR;  Service: Open Heart Surgery;  Laterality: N/A;   TOTAL KNEE ARTHROPLASTY Right 08/14/2009   @[facility]    TRANSURETHRAL RESECTION OF PROSTATE N/A 07/17/2021   Procedure: TRANSURETHRAL RESECTION OF THE PROSTATE (TURP);  Surgeon: Marcine Matar, MD;  Location: [facility];  Service: Urology;  Laterality: N/A;  1 HR    Family History  Problem Relation Age of Onset   Heart disease Mother    Diabetes Mother    Cirrhosis Mother    Emphysema Mother        never smoked but 2nd hand through her spouse   Hypertension Mother    Macular degeneration Mother    Heart disease Father    Cancer Father        prostate   Hyperlipidemia Father    Hypertension Father    Varicose Veins Father    Heart attack Father    Peripheral vascular disease Father    Heart disease Sister    Arthritis  Sister    Hyperlipidemia Sister    Obesity Sister    Macular degeneration Sister    Heart disease Brother        5 stents   Hyperlipidemia Brother    Macular degeneration Maternal Grandfather    Cirrhosis Sister    Obesity Sister    Arthritis Sister    Heart disease Sister    Obesity Sister    Liver disease Other    Prostate cancer Other    Coronary artery disease Other    Colon cancer Neg Hx    Esophageal  cancer Neg Hx    Rectal cancer Neg Hx    Stomach cancer Neg Hx     Social History   Socioeconomic History   Marital status: Married    Spouse name: [name]   Number of children: 0   Years of education: Not on file   Highest education level: Associate degree: academic program  Occupational History   Occupation: retired    Associate Professor: RETIRED  Tobacco Use   Smoking status: Former    Packs/day: 0.50    Years: 50.00    Additional pack years: 0.00    Total pack years: 25.00    Types: Cigarettes    Quit date: 05/01/2009    Years since quitting: 13.7   Smokeless tobacco: Former    Types: Chew    Quit date: 1995   Tobacco comments:    Former smoker 01/27/22  Vaping Use   Vaping Use: Never used  Substance and Sexual Activity   Alcohol use: Yes    Alcohol/week: 2.0 standard drinks of alcohol    Types: 2 Cans of beer per week    Comment: 1 beer daily 2 days a week 01/27/22   Drug use: Never   Sexual activity: Yes    Comment: lives with wife, no dietary restrictions  Other Topics Concern   Not on file  Social History Narrative   Lives with wife   Social Determinants of Health   Financial Resource Strain: Low Risk  (02/02/2023)   Overall Financial Resource Strain (CARDIA)    Difficulty of Paying Living Expenses: Not hard at all  Food Insecurity: No Food Insecurity (02/02/2023)   Hunger Vital Sign    Worried About Running Out of Food in the Last Year: Never true    Ran Out of Food in the Last Year: Never true  Transportation Needs: No Transportation Needs  (02/02/2023)   PRAPARE - Administrator, Civil Service (Medical): No    Lack of Transportation (Non-Medical): No  Physical Activity: Insufficiently Active (02/02/2023)   Exercise Vital Sign    Days of Exercise per Week: 2 days    Minutes of Exercise per Session: 30 min  Stress: No Stress Concern Present (02/02/2023)   [employer] of Occupational Health - Occupational Stress Questionnaire    Feeling of Stress : Only a little  Social Connections: Moderately Integrated (02/02/2023)   Social Connection and Isolation Panel [NHANES]    Frequency of Communication with Friends and Family: Twice a week    Frequency of Social Gatherings with Friends and Family: Twice a week    Attends Religious Services: Never    Database administrator or Organizations: Yes    Attends Banker Meetings: Never    Marital Status: Married  Catering manager Violence: Not At Risk (03/18/2022)   Humiliation, Afraid, Rape, and Kick questionnaire    Fear of Current or Ex-Partner: No    Emotionally Abused: No    Physically Abused: No    Sexually Abused: No    Outpatient Medications Prior to Visit  Medication Sig Dispense Refill   aspirin EC 81 MG EC tablet Take 1 tablet (81 mg total) by mouth daily. Swallow whole. 30 tablet 11   budesonide (ENTOCORT EC) 3 MG 24 hr capsule Take 2 capsules (6 mg total) by mouth daily. 180 capsule 0   cholecalciferol (VITAMIN D3) 25 MCG (1000 UNIT) tablet Take 10,000 mcg by mouth 4 (four) times a week.     ELIQUIS 5 MG TABS  tablet TAKE 1 TABLET BY MOUTH TWICE  DAILY 180 tablet 3   Evolocumab (REPATHA SURECLICK) 140 MG/ML SOAJ Inject 140 mg into the skin every 14 (fourteen) days. 6 mL 3   furosemide (LASIX) 20 MG tablet TAKE 1 TABLET BY MOUTH DAILY AS  NEEDED 90 tablet 3   latanoprost (XALATAN) 0.005 % ophthalmic solution Place 1 drop into the left eye at bedtime.      levalbuterol (XOPENEX HFA) 45 MCG/ACT inhaler INHALE 2 PUFFS BY MOUTH EVERY 4 HOURS AS NEEDED  FOR WHEEZING 45 g 2   magnesium oxide (MAG-OX) 400 (240 Mg) MG tablet TAKE 1 TABLET(400 MG) BY MOUTH THREE TIMES DAILY 270 tablet 1   metoprolol tartrate (LOPRESSOR) 100 MG tablet Take 1 tablet (100 mg total) by mouth 2 (two) times daily. 180 tablet 3   Multiple Vitamins-Minerals (PRESERVISION AREDS 2) CAPS Take 1 tablet by mouth daily.     nitroGLYCERIN (NITROSTAT) 0.4 MG SL tablet Place 1 tablet (0.4 mg total) under the tongue every 5 (five) minutes as needed for chest pain. 30 tablet 0   omeprazole (PRILOSEC OTC) 20 MG tablet Take 20 mg by mouth daily.     Ranibizumab (LUCENTIS IO) Inject 1 Dose into the eye every 6 (six) weeks. Bilateral eye by Dr Marvis Repress     tamsulosin (FLOMAX) 0.4 MG CAPS capsule Take 0.4 mg by mouth 2 (two) times daily.     diazepam (VALIUM) 5 MG tablet TAKE 1/2 TO 2 TABLETS(2.5 TO 10 MG) BY MOUTH EVERY 12 HOURS AS NEEDED FOR ANXIETY 30 tablet 1   benzonatate (TESSALON) 200 MG capsule Take 1 capsule (200 mg total) by mouth 3 (three) times daily as needed for cough. Take as needed for cough (Patient not taking: Reported on 01/21/2023) 30 capsule 0   No facility-administered medications prior to visit.    Allergies  Allergen Reactions   Albumin (Human) Anaphylaxis   Polymyxin B-Trimethoprim Swelling    Eye drops made eyes swell   Pseudoephedrine Other (See Comments)    Stomach cramps   Codeine Hives, Itching and Rash   Guaiacol Other (See Comments)    Hallucinations   Statins Other (See Comments)    Muscle cramps    Brimonidine Tartrate Itching and Swelling   Gabapentin Other (See Comments)    Pt unsure of sensitivity   Meloxicam Other (See Comments)    Pt unsure of sensitivity   Peppermint Flavor Other (See Comments)    Severe cramping   Pseudoephedrine-Guaifenesin Nausea And Vomiting    Stomach cramps   Rosuvastatin Calcium Other (See Comments)    Muscle aches   Tapentadol Other (See Comments)    Pt unsure of sensitivity   Ciprofloxacin Hives,  Itching, Nausea Only and Rash   Moxifloxacin Nausea Only and Other (See Comments)    Headaches, stomach cramps    Rofecoxib Other (See Comments)    Stomach cramping    Review of Systems  Musculoskeletal:        (+) lower back pain (+) right shoulder pain (+) hip pain in both hips       Objective:    Physical Exam Constitutional:      General: He is not in acute distress.    Appearance: Normal appearance.  HENT:     Head: Normocephalic and atraumatic.     Right Ear: External ear normal.     Left Ear: External ear normal.  Eyes:     Extraocular Movements: Extraocular movements intact.  Pupils: Pupils are equal, round, and reactive to light.  Cardiovascular:     Rate and Rhythm: Normal rate and regular rhythm.     Heart sounds: No murmur heard.    No gallop.  Pulmonary:     Effort: Pulmonary effort is normal. No respiratory distress.     Breath sounds: Normal breath sounds. No wheezing or rales.  Musculoskeletal:     Comments: (+) L2-L4 back pain  Skin:    General: Skin is warm.  Neurological:     Mental Status: He is alert and oriented to person, place, and time.  Psychiatric:        Judgment: Judgment normal.     BP 128/74 (BP Location: Right Arm, Patient Position: Sitting, Cuff Size: Normal)   Pulse 72   Temp (!) 97.5 F (36.4 C) (Oral)   Resp 16   Ht 5\' 4"  (1.626 m)   Wt 182 lb (82.6 kg)   SpO2 96%   BMI 31.24 kg/m  Wt Readings from Last 3 Encounters:  02/08/23 182 lb (82.6 kg)  01/21/23 182 lb (82.6 kg)  10/27/22 177 lb 3.2 oz (80.4 kg)       Assessment & Plan:  Chronic diastolic congestive heart failure (HCC) Assessment & Plan: No recent exacerbation, will monitor   Essential hypertension Assessment & Plan: Well controlled, no changes to meds. Encouraged heart healthy diet such as the DASH diet and exercise as tolerated.   Orders: -     CBC with Differential/Platelet -     Comprehensive metabolic panel -     TSH  Gastroesophageal  reflux disease, unspecified whether esophagitis present Assessment & Plan: Avoid offending foods, start probiotics. Do not eat large meals in late evening and consider raising head of bed.     Hypocalcemia Assessment & Plan: Supplement and monitor   Orders: -     VITAMIN D 25 Hydroxy (Vit-D Deficiency, Fractures)  Hyperlipidemia, mixed Assessment & Plan: Encourage heart healthy diet such as MIND or DASH diet, increase exercise, avoid trans fats, simple carbohydrates and processed foods, consider a krill or fish or flaxseed oil cap daily. Does not tolerate statins, is tolerating Repatha  Orders: -     Lipid panel  Hyperglycemia Assessment & Plan: [id number] acceptable, minimize simple carbs. Increase exercise as tolerated.    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 May try NSAIDs and prescription meds as directed and report if symptoms worsen or seek immediate care long history of back pain with radicular symptoms. Previously worked with Dr Newell Coral but he has retired tried seeking care at [facility] but tried IT trainer and does not want to go back there for care. Will refer to [facility] neurosurg, xray today  Orders: -     DG Lumbar Spine 2-3 Views; Future -     Ambulatory referral to Neurosurgery  Osteoporosis, unspecified osteoporosis type, unspecified pathological fracture presence Assessment & Plan: Encouraged to get adequate exercise, calcium and vitamin d intake    Prostate cancer (HCC) Assessment & Plan: Following with urology doing well   Other orders -     diazePAM; TAKE 1/2 TO 2 TABLETS(2.5 TO 10 MG) BY MOUTH EVERY 12 HOURS AS NEEDED FOR ANXIETY  Dispense: 30 tablet; Refill: 1    I, Danise Edge, MD, personally preformed the services described  in this documentation.   All medical record entries made by the scribe were at my direction and in my presence.  I have reviewed the chart and discharge instructions (if applicable) and agree that the record reflects my personal performance and is accurate and complete. 02/08/2023  Danise Edge, MD  [facility] as a scribe for Danise Edge, MD.,have documented all relevant documentation on the behalf of Danise Edge, MD,as directed by  Danise Edge, MD while in the presence of Danise Edge, MD.

## 2023-02-09 LAB — COMPREHENSIVE METABOLIC PANEL
ALT: 14 U/L (ref 0–53)
AST: 17 U/L (ref 0–37)
Albumin: 4 g/dL (ref 3.5–5.2)
Alkaline Phosphatase: 59 U/L (ref 39–117)
BUN: 22 mg/dL (ref 6–23)
CO2: 27 mEq/L (ref 19–32)
Calcium: 9.5 mg/dL (ref 8.4–10.5)
Chloride: 102 mEq/L (ref 96–112)
Creatinine, Ser: 0.89 mg/dL (ref 0.40–1.50)
GFR: 83.5 mL/min (ref >60.00)
Glucose, Bld: 96 mg/dL (ref 70–99)
Potassium: 5.1 mEq/L (ref 3.5–5.1)
Sodium: 138 mEq/L (ref 135–145)
Total Bilirubin: 0.6 mg/dL (ref 0.2–1.2)
Total Protein: 6.7 g/dL (ref 6.0–8.3)

## 2023-02-09 LAB — CBC WITH DIFFERENTIAL/PLATELET
Basophils Absolute: 0 10*3/uL (ref 0.0–0.1)
Basophils Relative: 0.7 % (ref 0.0–3.0)
Eosinophils Absolute: 0.1 10*3/uL (ref 0.0–0.7)
Eosinophils Relative: 1.3 % (ref 0.0–5.0)
HCT: 36.9 % — ABNORMAL LOW (ref 39.0–52.0)
Hemoglobin: 12.7 g/dL — ABNORMAL LOW (ref 13.0–17.0)
Lymphocytes Relative: 13.3 % (ref 12.0–46.0)
Lymphs Abs: 0.9 10*3/uL (ref 0.7–4.0)
MCHC: 34.3 g/dL (ref 30.0–36.0)
MCV: 87.8 fl (ref 78.0–100.0)
Monocytes Absolute: 0.8 10*3/uL (ref 0.1–1.0)
Monocytes Relative: 11.4 % (ref 3.0–12.0)
Neutro Abs: 5.1 10*3/uL (ref 1.4–7.7)
Neutrophils Relative %: 73.3 % (ref 43.0–77.0)
Platelets: 177 10*3/uL (ref 150.0–400.0)
RBC: 4.2 Mil/uL — ABNORMAL LOW (ref 4.22–5.81)
RDW: 16.3 % — ABNORMAL HIGH (ref 11.5–15.5)
WBC: 7 10*3/uL (ref 4.0–10.5)

## 2023-02-09 LAB — LIPID PANEL
Cholesterol: 133 mg/dL (ref 0–200)
HDL: 58.5 mg/dL (ref >39.00)
LDL Cholesterol: 49 mg/dL (ref 0–99)
NonHDL: 74
Total CHOL/HDL Ratio: 2
Triglycerides: 125 mg/dL (ref 0.0–149.0)
VLDL: 25 mg/dL (ref 0.0–40.0)

## 2023-02-09 LAB — TSH: TSH: 1.55 u[IU]/mL (ref 0.35–5.50)

## 2023-02-09 LAB — VITAMIN D 25 HYDROXY (VIT D DEFICIENCY, FRACTURES): VITD: 86.14 ng/mL (ref 30.00–100.00)

## 2023-02-10 DIAGNOSIS — C44329 Squamous cell carcinoma of skin of other parts of face: Secondary | ICD-10-CM | POA: Diagnosis not present

## 2023-02-10 DIAGNOSIS — D485 Neoplasm of uncertain behavior of skin: Secondary | ICD-10-CM | POA: Diagnosis not present

## 2023-02-15 ENCOUNTER — Other Ambulatory Visit: Payer: Self-pay | Admitting: Internal Medicine

## 2023-02-19 DIAGNOSIS — C61 Malignant neoplasm of prostate: Secondary | ICD-10-CM | POA: Diagnosis not present

## 2023-02-19 NOTE — Progress Notes (Unsigned)
Referring Physician:  [name], MD [name] [place],  [place] [address]  Primary Physician:  [name], MD  History of Present Illness: 02/25/2023 Ryan [name] has a history of BPH, CAD, heart failure, CKD 3, COPD, GERD, TIA, HTN, prostate CA, osteoporosis, PAD, CVA, and afib.   He has seen Dr. Jule Ser in the past.   He has chronic constant LBP with posterior bilateral leg pain to his knees, occasionally to his feet. Buttock pain is his worst pain. Pain is worse with walking. Some relief with grocery cart and tylenol. No numbness or tingling in his legs. His legs feel heavy and fatigued. They feel tired. He has weakness in the legs.   He is on ELIQUIS.   Bowel/Bladder Dysfunction: none  Conservative measures:  Physical therapy: [facility] initial eval for back on 06/24/22- he thinks he did 5-6 weeks of PT and does HEP.  Multimodal medical therapy including regular antiinflammatories: Tylenol Injections:  Had ESIs in the past, none in last 3 years.   Past Surgery:  ACDF C5-C7 2003  Ryan [name] has   no symptoms of cervical myelopathy.  The symptoms are causing a significant impact on the patient's life.   Review of Systems:  A 10 point review of systems is negative, except for the pertinent positives and negatives detailed in the HPI.  Past Medical History: Past Medical History:  Diagnosis Date   Anemia    Anticoagulant long-term use    eliquis--- mananged by cardiology   BPH with urinary obstruction    CAD (coronary artery disease)    cardiologist--- dr g. taylor--- cath 04-22-2011  moderate LM stenosis, borderline sig pRCA, sig stenosis mid to distal LAD ;  aggressive medical therapy;   cath 07-21-2021 for chest pain w/ ST changes,  severe disease;  s/p CABG x4 07-22-2021   Chronic diastolic CHF (congestive heart failure) (HCC) 03/2018   followed by cardiology   CKD (chronic kidney disease), stage III (HCC)    COPD (chronic  obstructive pulmonary disease) (HCC)    followed by pcp   DOE (dyspnea on exertion)    per pt when walk >100yds,  per pt rides outside bike 4-8 miles daily without sob,  household chores and yard work without sob   Eczema    GERD (gastroesophageal reflux disease)    Glaucoma, left eye    Heart murmur    Hemorrhoids    History of adenomatous polyp of colon    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 Barron Alvine--- dx by biopsy 01-28-2021   Macular degeneration of both eyes    followed by dr c. Gwendalyn Ege--- bilateral eye injection every 6 wks   Malignant neoplasm prostate [facility]) 07/2021   urologist-- dr Retta Diones;  dx  10/ 2022,  Gleason 5+5, PSA 1.8   Osteoporosis 05/31/2016   PAF (paroxysmal atrial fibrillation) (HCC) 03/2012   cardiologist-- dr g. taylor;  first dx 06/ 2013 AFlutter w/ RVR   Vitamin D deficiency     Past Surgical History: Past Surgical History:  Procedure Laterality Date   ANTERIOR CERVICAL DECOMP/DISCECTOMY FUSION  06/08/2002   @[facility] ;  C5--C7   APPENDECTOMY  1953   CARDIAC CATHETERIZATION  04/22/2011   moderate left main and RCA stenosis not significant by FFR and IVUS on medical therapy   CARDIOVERSION N/A 04/25/2018   Procedure: CARDIOVERSION;  Surgeon: Chilton Si, MD;  Location: [facility] ENDOSCOPY;  Service: Cardiovascular;  Laterality: N/A;   CATARACT EXTRACTION W/ INTRAOCULAR LENS IMPLANT Bilateral 2021    COLONOSCOPY WITH ESOPHAGOGASTRODUODENOSCOPY (EGD)  01/28/2021   by [email or web address]   CORONARY ARTERY BYPASS GRAFT N/A 07/22/2021   Procedure: 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, ON PUMP, USING LEFT INTERNAL MAMMARY ARTERY AND ENDOSCOPICALLY HARVESTED RIGHT GREATER SAPHENOUS VEIN;  Surgeon: Loreli Slot, MD;  Location: [facility] OR;  Service: Open Heart Surgery;  Laterality: N/A;   ENDOVEIN HARVEST OF GREATER SAPHENOUS VEIN  07/22/2021   Procedure: ENDOVEIN HARVEST OF GREATER SAPHENOUS VEIN;  Surgeon: Loreli Slot, MD;  Location: [facility] OR;  Service: Open Heart Surgery;;   EXTRACORPOREAL SHOCK WAVE LITHOTRIPSY     x2  1990s   FOOT SURGERY Right 1970   calcification removed from top of foot   GOLD SEED IMPLANT N/A 11/28/2021   Procedure: GOLD SEED IMPLANT;  Surgeon: Jerilee Field, MD;  Location: [facility];  Service: Urology;  Laterality: N/A;   KNEE ARTHROSCOPY Right 1991   LAPAROSCOPIC CHOLECYSTECTOMY  2000   LEFT HEART CATH AND CORONARY ANGIOGRAPHY N/A 07/21/2021   Procedure: LEFT HEART CATH AND CORONARY ANGIOGRAPHY;  Surgeon: Runell Gess, MD;  Location: [facility] INVASIVE CV LAB;  Service: Cardiovascular;  Laterality: N/A;   SPACE OAR INSTILLATION N/A 11/28/2021   Procedure: SPACE OAR INSTILLATION;  Surgeon: Jerilee Field, MD;  Location: [facility];  Service: Urology;  Laterality: N/A;   TEE WITHOUT CARDIOVERSION N/A 07/22/2021   Procedure: TRANSESOPHAGEAL ECHOCARDIOGRAM (TEE);  Surgeon: Loreli Slot, MD;  Location: [facility] OR;  Service: Open Heart Surgery;  Laterality: N/A;   TOTAL KNEE ARTHROPLASTY Right 08/14/2009   @[facility]    TRANSURETHRAL RESECTION OF PROSTATE N/A 07/17/2021   Procedure: TRANSURETHRAL RESECTION OF THE PROSTATE (TURP);  Surgeon: Marcine Matar, MD;  Location: [facility];  Service: Urology;  Laterality: N/A;  1 HR    Allergies: Allergies as of 02/25/2023 - Review Complete 02/08/2023  Allergen Reaction  Noted   Albumin (human) Anaphylaxis 04/12/2020   Polymyxin b-trimethoprim Swelling 01/28/2012   Pseudoephedrine Other (See Comments) 03/21/2014   Codeine Hives, Itching, and Rash 03/21/2007   Guaiacol Other (See Comments) 06/08/2013   Statins Other (See Comments) 03/21/2007   Brimonidine tartrate Itching and Swelling 10/27/2022   Gabapentin Other (See Comments) 01/28/2021   Meloxicam Other (See Comments) 11/07/2010   Peppermint flavor Other (See Comments) 07/25/2021   Pseudoephedrine-guaifenesin Nausea And Vomiting 02/25/2014   Rosuvastatin calcium Other (See Comments) 04/26/2014   Tapentadol Other (See Comments) 11/07/2010   Ciprofloxacin Hives, Itching, Nausea Only, and Rash 03/21/2007   Moxifloxacin Nausea Only and Other (See Comments) 11/07/2010   Rofecoxib Other (See Comments) 06/08/2013    Medications: Outpatient Encounter Medications as of 02/25/2023  Medication Sig   aspirin EC 81 MG EC tablet Take 1 tablet (81  mg total) by mouth daily. Swallow whole.   budesonide (ENTOCORT EC) 3 MG 24 hr capsule Take 2 capsules (6 mg total) by mouth daily.   cholecalciferol (VITAMIN D3) 25 MCG (1000 UNIT) tablet Take 10,000 mcg by mouth 4 (four) times a week.   diazepam (VALIUM) 5 MG tablet TAKE 1/2 TO 2 TABLETS(2.5 TO 10 MG) BY MOUTH EVERY 12 HOURS AS NEEDED FOR ANXIETY   ELIQUIS 5 MG TABS tablet TAKE 1 TABLET BY MOUTH TWICE  DAILY   Evolocumab (REPATHA SURECLICK) 140 MG/ML SOAJ Inject 140 mg into the skin every 14 (fourteen) days.   furosemide (LASIX) 20 MG tablet TAKE 1 TABLET BY MOUTH DAILY AS  NEEDED   latanoprost (XALATAN) 0.005 % ophthalmic solution Place 1 drop into the left eye at bedtime.    levalbuterol (XOPENEX HFA) 45 MCG/ACT inhaler INHALE 2 PUFFS BY MOUTH EVERY 4 HOURS AS NEEDED FOR WHEEZING   magnesium oxide (MAG-OX) 400 (240 Mg) MG tablet TAKE 1 TABLET(400 MG) BY MOUTH THREE TIMES DAILY   metoprolol tartrate (LOPRESSOR) 100 MG tablet TAKE 1 TABLET BY MOUTH TWICE  DAILY    Multiple Vitamins-Minerals (PRESERVISION AREDS 2) CAPS Take 1 tablet by mouth daily.   nitroGLYCERIN (NITROSTAT) 0.4 MG SL tablet Place 1 tablet (0.4 mg total) under the tongue every 5 (five) minutes as needed for chest pain.   omeprazole (PRILOSEC OTC) 20 MG tablet Take 20 mg by mouth daily.   Ranibizumab (LUCENTIS IO) Inject 1 Dose into the eye every 6 (six) weeks. Bilateral eye by Dr Marvis Repress   tamsulosin (FLOMAX) 0.4 MG CAPS capsule Take 0.4 mg by mouth 2 (two) times daily.   No facility-administered encounter medications on file as of 02/25/2023.    Social History: Social History   Tobacco Use   Smoking status: Former    Packs/day: 0.50    Years: 50.00    Additional pack years: 0.00    Total pack years: 25.00    Types: Cigarettes    Quit date: 05/01/2009    Years since quitting: 13.8   Smokeless tobacco: Former    Types: Chew    Quit date: 1995   Tobacco comments:    Former smoker 01/27/22  Vaping Use   Vaping Use: Never used  Substance Use Topics   Alcohol use: Yes    Alcohol/week: 2.0 standard drinks of alcohol    Types: 2 Cans of beer per week    Comment: 1 beer daily 2 days a week 01/27/22   Drug use: Never    Family Medical History: Family History  Problem Relation Age of Onset   Heart disease Mother    Diabetes Mother    Cirrhosis Mother    Emphysema Mother        never smoked but 2nd hand through her spouse   Hypertension Mother    Macular degeneration Mother    Heart disease Father    Cancer Father        prostate   Hyperlipidemia Father    Hypertension Father    Varicose Veins Father    Heart attack Father    Peripheral vascular disease Father    Heart disease Sister    Arthritis Sister    Hyperlipidemia Sister    Obesity Sister    Macular degeneration Sister    Heart disease Brother        5 stents   Hyperlipidemia Brother    Macular degeneration Maternal Grandfather    Cirrhosis Sister  Obesity Sister    Arthritis Sister    Heart  disease Sister    Obesity Sister    Liver disease Other    Prostate cancer Other    Coronary artery disease Other    Colon cancer Neg Hx    Esophageal cancer Neg Hx    Rectal cancer Neg Hx    Stomach cancer Neg Hx     Physical Examination: Vitals:   02/25/23 1410  BP: (!) 149/69    General: Patient is well developed, well nourished, calm, collected, and in no apparent distress. Attention to examination is appropriate.  Respiratory: Patient is breathing without any difficulty.   NEUROLOGICAL:     Awake, alert, oriented to person, place, and time.  Speech is clear and fluent. Fund of knowledge is appropriate.   Cranial Nerves: Pupils equal round and reactive to light.  Facial tone is symmetric.    Mild lower posterior lumbar tenderness.   No abnormal lesions on exposed skin.   Strength: Side Biceps Triceps Deltoid Interossei Grip Wrist Ext. Wrist Flex.  R 5 5 5 5 5 5 5   L 5 5 5 5 5 5 5    Side Iliopsoas Quads Hamstring PF DF EHL  R 5 5 5 5 5 5   L 5 5 5 5 5 5    Reflexes are 2+ and symmetric at the biceps, triceps, brachioradialis, patella and achilles.   Hoffman's is positive bilateral upper extremities.  Clonus is not present.   Bilateral upper and lower extremity sensation is intact to light touch.     Slow gait. He ambulates with a cane.   Medical Decision Making  Imaging: Lumbar xrays dated 02/08/23:  FINDINGS: Osteopenia. Alignment is anatomic. Vertebral body and disc space heights are maintained. Mild facet hypertrophy in the lower lumbar spine.   IMPRESSION: Osteopenia.  Mild facet hypertrophy in the lower lumbar spine.     Electronically Signed   By: Leanna Battles M.D.   On: 02/12/2023 14:46  I have personally reviewed the images and agree with the above interpretation.  Assessment and Plan: Mr. Ebersold is a pleasant 77 y.o. male has chronic constant LBP with posterior bilateral leg pain to his knees, occasionally to his feet. Buttock pain is his  worst pain. Pain is worse with walking. His legs feel heavy and fatigued.   He has known lumbar spondylosis. Symptoms are suspicious for spinal stenosis.   Treatment options discussed with patient and following plan made:   - MRI of lumbar spine to further evaluate lumbar radiculopathy. No improvement time or PT.  - Depending on results of MRI, may revisit injections.  - He did PT at [facility] in the fall of 2023. May need to get notes.  - He is interested in surgery if it will "fix the problem." - Will likely review MRI with Dr. Myer Haff and then set up phone visit to review them with him.  - Positive hoffman's on exam. Will follow. No other signs/symptoms of myelopathy.   BP was elevated. No symptoms of chest pain, shortness of breath, blurry vision, or headaches. He checks BP at home and it generally runs 140s/80s. Will recheck at home and call PCP if not improved. If he develops CP, SOB, blurry vision, or headaches, then he will go to ED.     I spent a total of 40 minutes in face-to-face and non-face-to-face activities related to this patient's care today including review of outside records, review of imaging, review of symptoms, physical exam,  discussion of differential diagnosis, discussion of treatment options, and documentation.   Thank you for involving me in the care of this patient.   Drake Leach PA-C Dept. of Neurosurgery

## 2023-02-25 ENCOUNTER — Ambulatory Visit (INDEPENDENT_AMBULATORY_CARE_PROVIDER_SITE_OTHER): Payer: Medicare Other | Admitting: Orthopedic Surgery

## 2023-02-25 ENCOUNTER — Encounter: Payer: Self-pay | Admitting: Orthopedic Surgery

## 2023-02-25 VITALS — BP 175/96 | Ht 64.0 in | Wt 183.4 lb

## 2023-02-25 DIAGNOSIS — M4726 Other spondylosis with radiculopathy, lumbar region: Secondary | ICD-10-CM | POA: Diagnosis not present

## 2023-02-25 DIAGNOSIS — M47816 Spondylosis without myelopathy or radiculopathy, lumbar region: Secondary | ICD-10-CM

## 2023-02-25 DIAGNOSIS — M5416 Radiculopathy, lumbar region: Secondary | ICD-10-CM

## 2023-02-25 NOTE — Patient Instructions (Addendum)
It was so nice to see you today. Thank you so much for coming in.    Your lower back xrays showed some wear and tear (arthritis) and I think this may be causing your pain.   I want to get an MRI of your lower back to look into things further. We will get this approved through your insurance and [employer] will call you to schedule the appointment.   Once I get the MRI results back, we will call you to set up a phone visit with me to review them.   Your blood pressure was elevated today. I want you to recheck it at home and follow up with your PCP if it remains high. If you have any chest pain, shortness of breath, blurry vision, or headaches then you need to go to ED.    Please do not hesitate to call if you have any questions or concerns. You can also message me in MyChart.   If you have not heard back about the MRI  in the next week, please call the office so we can help you get it scheduled.   Drake Leach PA-C [phone]

## 2023-02-28 ENCOUNTER — Ambulatory Visit (HOSPITAL_BASED_OUTPATIENT_CLINIC_OR_DEPARTMENT_OTHER)
Admission: RE | Admit: 2023-02-28 | Discharge: 2023-02-28 | Disposition: A | Discharge status: Home / Self Care | Payer: Medicare Other | Source: Ambulatory Visit | Attending: Orthopedic Surgery | Admitting: Orthopedic Surgery

## 2023-02-28 DIAGNOSIS — M47816 Spondylosis without myelopathy or radiculopathy, lumbar region: Secondary | ICD-10-CM | POA: Diagnosis not present

## 2023-02-28 DIAGNOSIS — M5416 Radiculopathy, lumbar region: Secondary | ICD-10-CM | POA: Diagnosis not present

## 2023-02-28 DIAGNOSIS — M5116 Intervertebral disc disorders with radiculopathy, lumbar region: Secondary | ICD-10-CM | POA: Diagnosis not present

## 2023-03-03 ENCOUNTER — Telehealth: Payer: Self-pay | Admitting: Internal Medicine

## 2023-03-03 NOTE — Telephone Encounter (Signed)
Spoke to patient, with his Ryan [name] F plan Ryan [name] would be perfect cost effective lipid lowering option for secondary prevention. Patient is reluctant to try new medication. Want [place] to re-enroll him in Ryan [name].  Patient will talk to Dr Ladona Ridgel about Ryan [name] injection at next OV in July,2024  Re enrolled in the the [name]  Provided Co-pay card info over the phone and MyChart

## 2023-03-03 NOTE — Telephone Encounter (Signed)
  Pt c/o medication issue:  1. Name of Medication:   Evolocumab (REPATHA SURECLICK) 140 MG/ML SOAJ    2. How are you currently taking this medication (dosage and times per day)? Inject 140 mg into the skin every 14 (fourteen) days.   3. Are you having a reaction (difficulty breathing--STAT)? No   4. What is your medication issue? Pt said, he tried to get a refill for this medication. He said, it will cost him $1700 for 3 months and can't afford it

## 2023-03-10 ENCOUNTER — Other Ambulatory Visit (HOSPITAL_BASED_OUTPATIENT_CLINIC_OR_DEPARTMENT_OTHER): Payer: Self-pay

## 2023-03-10 DIAGNOSIS — C61 Malignant neoplasm of prostate: Secondary | ICD-10-CM | POA: Diagnosis not present

## 2023-03-10 MED ORDER — REPATHA SURECLICK 140 MG/ML ~~LOC~~ SOAJ
140.0000 mg | SUBCUTANEOUS | 3 refills | Status: DC
  Filled 2023-03-10 – 2023-05-27 (×3): qty 6, 84d supply, fill #0
  Filled 2023-08-22: qty 6, 84d supply, fill #1
  Filled 2024-02-02 – 2024-02-10 (×2): qty 6, 84d supply, fill #3

## 2023-03-10 NOTE — Addendum Note (Signed)
Addended by: Tylene Fantasia on: 03/10/2023 02:24 PM   Modules accepted: Orders

## 2023-03-10 NOTE — Telephone Encounter (Signed)
Call [facility], they are unable to use [facility] co-pay card. LVM for patient - [facility], they will be able to use the co-pay card and will mail prescription to patient

## 2023-03-10 NOTE — Progress Notes (Unsigned)
Telephone Visit- Progress Note: Referring Physician:  [name], MD [name] [place],  [place] [address]  Primary Physician:  [name], MD  This visit was performed via telephone.  Patient location: home Provider location: office  I spent a total of 10 minutes non-face-to-face activities for this visit on the date of this encounter including review of current clinical condition and response to treatment.    Patient has given verbal consent to this telephone visits and we reviewed the limitations of a telephone visit. Patient wishes to proceed.    Chief Complaint:  review lumbar MRI results  History of Present Illness: Ryan [name] is a 77 y.o. male has a history of BPH, CAD, heart failure, CKD 3, COPD, GERD, TIA, HTN, prostate CA, osteoporosis, PAD, CVA, and afib.    Last seen by me on 02/25/23 for chronic constant LBP with posterior bilateral leg pain to his knees, occasionally to his feet. Buttock pain is his worst pain. Pain is worse with walking. He has known lumbar spondylosis. Symptoms are suspicious for spinal stenosis.   MRI ordered and phone visit is scheduled to review it.   No change in his symptoms.   He has constant LBP with posterior bilateral leg pain to his knees, occasionally to his feet. Buttock pain is his worst pain. Pain is worse with walking. No numbness or tingling in his legs. His legs feel heavy and fatigued.    He is on ELIQUIS.    Conservative measures:  Physical therapy: [facility] did 9 visits from 06/24/22-08/03/22 Multimodal medical therapy including regular antiinflammatories: Tylenol Injections:  Had ESIs in the past, none in last 3 years.    Past Surgery:  ACDF C5-C7 2003   Ryan [name] has   no symptoms of cervical myelopathy.   The symptoms are causing a significant impact on the patient's life.    Exam: No exam done as this was a telephone encounter.     Imaging: Lumbar MRI dated 02/28/23:   FINDINGS: Segmentation:  5 lumbar type vertebral bodies.   Alignment:  Normal   Vertebrae: No fracture. Fatty changes of the marrow in the lower lumbar spine and sacrum related to previous radiation. No evidence of destructive bone process.   Conus medullaris and cauda equina: Conus extends to the L1-2 level. Conus and cauda equina appear normal.   Paraspinal and other soft tissues: Negative. No visible lymphadenopathy.   Disc levels:   No disc level abnormality at L2-3 or above.   L3-4: Minimal disc bulge. Minimal facet and ligamentous hypertrophy. No compressive stenosis.   L4-5: Moderate disc bulge. Mild facet and ligamentous hypertrophy. Mild multifactorial stenosis with potential for neural compression in either or both lateral recesses. This has worsened slightly since 2020.   L5-S1: Mild bulging of the disc. No compressive canal or foraminal narrowing.   IMPRESSION: 1. L4-5: Moderate disc bulge. Mild facet and ligamentous hypertrophy. Mild multifactorial stenosis with potential for neural compression in either or both lateral recesses. This has worsened slightly since 2020. 2. L3-4: Minimal disc bulge. Mild facet and ligamentous hypertrophy. No compressive stenosis. 3. L5-S1: Mild disc bulge. No compressive stenosis. 4. Post radiation fatty change of the marrow. No sign of regional osseous metastatic disease.     Electronically Signed   By: Paulina Fusi M.D.   On: 03/08/2023 15:43  I have personally reviewed the images and agree with the above interpretation.  Assessment and Plan: Ryan [name]  is a pleasant 77 y.o. male has chronic constant LBP with posterior bilateral leg pain to his knees, occasionally to his feet. Buttock pain is his worst pain. Pain is worse with walking. His legs feel heavy and fatigued.    He has known lumbar spondylosis mild/moderate spinal stenosis at L4-L5 along with lateral recess stenosis. Pain likely is from this level.   No  relief with previous PT, injections, or time. He is interested in any surgery options.    Treatment options discussed with patient and following plan made:   - As above, he does not want to revisit lumbar injections.  - He did 9 visits of PT at [facility] from 06/24/22-08/03/22 with no improvement.  - Follow up scheduled with Dr. Myer Haff to see if he is surgical candidate.   Drake Leach PA-C Neurosurgery

## 2023-03-10 NOTE — Telephone Encounter (Signed)
patient wants  to get next refill for Repatha from [facility] - prefers [facility] location. Prescription sent and grant card info provided to the pharmacy. Co-pay card information shared with Sharyl Nimrod at the pharmacy.  ID: [id number]   CARD STATUS Active   BIN [id number]   PCN [id number]   PC GROUP [id number]

## 2023-03-11 ENCOUNTER — Ambulatory Visit (INDEPENDENT_AMBULATORY_CARE_PROVIDER_SITE_OTHER): Payer: Medicare Other | Admitting: Orthopedic Surgery

## 2023-03-11 ENCOUNTER — Encounter: Payer: Self-pay | Admitting: Orthopedic Surgery

## 2023-03-11 DIAGNOSIS — H353231 Exudative age-related macular degeneration, bilateral, with active choroidal neovascularization: Secondary | ICD-10-CM | POA: Diagnosis not present

## 2023-03-11 DIAGNOSIS — H401122 Primary open-angle glaucoma, left eye, moderate stage: Secondary | ICD-10-CM | POA: Diagnosis not present

## 2023-03-11 DIAGNOSIS — M48061 Spinal stenosis, lumbar region without neurogenic claudication: Secondary | ICD-10-CM | POA: Diagnosis not present

## 2023-03-11 DIAGNOSIS — M5416 Radiculopathy, lumbar region: Secondary | ICD-10-CM

## 2023-03-11 DIAGNOSIS — Z8673 Personal history of transient ischemic attack (TIA), and cerebral infarction without residual deficits: Secondary | ICD-10-CM | POA: Diagnosis not present

## 2023-03-11 DIAGNOSIS — Z961 Presence of intraocular lens: Secondary | ICD-10-CM | POA: Diagnosis not present

## 2023-03-11 DIAGNOSIS — M4726 Other spondylosis with radiculopathy, lumbar region: Secondary | ICD-10-CM | POA: Diagnosis not present

## 2023-03-11 DIAGNOSIS — M47816 Spondylosis without myelopathy or radiculopathy, lumbar region: Secondary | ICD-10-CM

## 2023-03-11 DIAGNOSIS — I4891 Unspecified atrial fibrillation: Secondary | ICD-10-CM | POA: Diagnosis not present

## 2023-03-11 DIAGNOSIS — Z7901 Long term (current) use of anticoagulants: Secondary | ICD-10-CM | POA: Diagnosis not present

## 2023-03-22 DIAGNOSIS — H401122 Primary open-angle glaucoma, left eye, moderate stage: Secondary | ICD-10-CM | POA: Diagnosis not present

## 2023-03-23 ENCOUNTER — Ambulatory Visit (INDEPENDENT_AMBULATORY_CARE_PROVIDER_SITE_OTHER): Payer: Medicare Other | Admitting: *Deleted

## 2023-03-23 VITALS — BP 147/82 | HR 62 | Ht 64.0 in | Wt 183.2 lb

## 2023-03-23 DIAGNOSIS — Z0000 Encounter for general adult medical examination without abnormal findings: Secondary | ICD-10-CM | POA: Diagnosis not present

## 2023-03-23 NOTE — Patient Instructions (Signed)
Ryan [name] , Thank you for taking time to come for your Medicare Wellness Visit. I appreciate your ongoing commitment to your health goals. Please review the following plan we discussed and let me know if I can assist you in the future.     This is a list of the screening recommended for you and due dates:  Health Maintenance  Topic Date Due   Screening for Lung Cancer  01/18/2023   COVID-19 Vaccine (7 - 2023-24 season) 06/04/2023*   Flu Shot  05/06/2023   Medicare Annual Wellness Visit  03/22/2024   DTaP/Tdap/Td vaccine (4 - Td or Tdap) 05/29/2032   Pneumonia Vaccine  Completed   Hepatitis C Screening  Completed   Zoster (Shingles) Vaccine  Completed   HPV Vaccine  Aged Out   Colon Cancer Screening  Discontinued  *Topic was postponed. The date shown is not the original due date.    Next appointment: Follow up in one year for your annual wellness visit.   Preventive Care 76 Years and Older, Male Preventive care refers to lifestyle choices and visits with your health care provider that can promote health and wellness. What does preventive care include? A yearly physical exam. This is also called an annual well check. Dental exams once or twice a year. Routine eye exams. Ask your health care provider how often you should have your eyes checked. Personal lifestyle choices, including: Daily care of your teeth and gums. Regular physical activity. Eating a healthy diet. Avoiding tobacco and drug use. Limiting alcohol use. Practicing safe sex. Taking low doses of aspirin every day. Taking vitamin and mineral supplements as recommended by your health care provider. What happens during an annual well check? The services and screenings done by your health care provider during your annual well check will depend on your age, overall health, lifestyle risk factors, and family history of disease. Counseling  Your health care provider may ask you questions about your: Alcohol  use. Tobacco use. Drug use. Emotional well-being. Home and relationship well-being. Sexual activity. Eating habits. History of falls. Memory and ability to understand (cognition). Work and work Astronomer. Screening  You may have the following tests or measurements: Height, weight, and BMI. Blood pressure. Lipid and cholesterol levels. These may be checked every 5 years, or more frequently if you are over 76 years old. Skin check. Lung cancer screening. You may have this screening every year starting at age 76 if you have a 30-pack-year history of smoking and currently smoke or have quit within the past 15 years. Fecal occult blood test (FOBT) of the stool. You may have this test every year starting at age 76. Flexible sigmoidoscopy or colonoscopy. You may have a sigmoidoscopy every 5 years or a colonoscopy every 10 years starting at age 55. Prostate cancer screening. Recommendations will vary depending on your family history and other risks. Hepatitis C blood test. Hepatitis B blood test. Sexually transmitted disease (STD) testing. Diabetes screening. This is done by checking your blood sugar (glucose) after you have not eaten for a while (fasting). You may have this done every 1-3 years. Abdominal aortic aneurysm (AAA) screening. You may need this if you are a current or former smoker. Osteoporosis. You may be screened starting at age 76 if you are at high risk. Talk with your health care provider about your test results, treatment options, and if necessary, the need for more tests. Vaccines  Your health care provider may recommend certain vaccines, such as: Influenza vaccine. This  is recommended every year. Tetanus, diphtheria, and acellular pertussis (Tdap, Td) vaccine. You may need a Td booster every 10 years. Zoster vaccine. You may need this after age 76. Pneumococcal 13-valent conjugate (PCV13) vaccine. One dose is recommended after age 76. Pneumococcal polysaccharide  (PPSV23) vaccine. One dose is recommended after age 76. Talk to your health care provider about which screenings and vaccines you need and how often you need them. This information is not intended to replace advice given to you by your health care provider. Make sure you discuss any questions you have with your health care provider. Document Released: 10/18/2015 Document Revised: 06/10/2016 Document Reviewed: 07/23/2015 Elsevier Interactive Patient Education  2017 [employer] Prevention in the Home Falls can cause injuries. They can happen to people of all ages. There are many things you can do to make your home safe and to help prevent falls. What can I do on the outside of my home? Regularly fix the edges of walkways and driveways and fix any cracks. Remove anything that might make you trip as you walk through a door, such as a raised step or threshold. Trim any bushes or trees on the path to your home. Use bright outdoor lighting. Clear any walking paths of anything that might make someone trip, such as rocks or tools. Regularly check to see if handrails are loose or broken. Make sure that both sides of any steps have handrails. Any raised decks and porches should have guardrails on the edges. Have any leaves, snow, or ice cleared regularly. Use sand or salt on walking paths during winter. Clean up any spills in your garage right away. This includes oil or grease spills. What can I do in the bathroom? Use night lights. Install grab bars by the toilet and in the tub and shower. Do not use towel bars as grab bars. Use non-skid mats or decals in the tub or shower. If you need to sit down in the shower, use a plastic, non-slip stool. Keep the floor dry. Clean up any water that spills on the floor as soon as it happens. Remove soap buildup in the tub or shower regularly. Attach bath mats securely with double-sided non-slip rug tape. Do not have throw rugs and other things on the  floor that can make you trip. What can I do in the bedroom? Use night lights. Make sure that you have a light by your bed that is easy to reach. Do not use any sheets or blankets that are too big for your bed. They should not hang down onto the floor. Have a firm chair that has side arms. You can use this for support while you get dressed. Do not have throw rugs and other things on the floor that can make you trip. What can I do in the kitchen? Clean up any spills right away. Avoid walking on wet floors. Keep items that you use a lot in easy-to-reach places. If you need to reach something above you, use a strong step stool that has a grab bar. Keep electrical cords out of the way. Do not use floor polish or wax that makes floors slippery. If you must use wax, use non-skid floor wax. Do not have throw rugs and other things on the floor that can make you trip. What can I do with my stairs? Do not leave any items on the stairs. Make sure that there are handrails on both sides of the stairs and use them. Fix handrails that  are broken or loose. Make sure that handrails are as long as the stairways. Check any carpeting to make sure that it is firmly attached to the stairs. Fix any carpet that is loose or worn. Avoid having throw rugs at the top or bottom of the stairs. If you do have throw rugs, attach them to the floor with carpet tape. Make sure that you have a light switch at the top of the stairs and the bottom of the stairs. If you do not have them, ask someone to add them for you. What else can I do to help prevent falls? Wear shoes that: Do not have high heels. Have rubber bottoms. Are comfortable and fit you well. Are closed at the toe. Do not wear sandals. If you use a stepladder: Make sure that it is fully opened. Do not climb a closed stepladder. Make sure that both sides of the stepladder are locked into place. Ask someone to hold it for you, if possible. Clearly mark and make  sure that you can see: Any grab bars or handrails. First and last steps. Where the edge of each step is. Use tools that help you move around (mobility aids) if they are needed. These include: Canes. Walkers. Scooters. Crutches. Turn on the lights when you go into a dark area. Replace any light bulbs as soon as they burn out. Set up your furniture so you have a clear path. Avoid moving your furniture around. If any of your floors are uneven, fix them. If there are any pets around you, be aware of where they are. Review your medicines with your doctor. Some medicines can make you feel dizzy. This can increase your chance of falling. Ask your doctor what other things that you can do to help prevent falls. This information is not intended to replace advice given to you by your health care provider. Make sure you discuss any questions you have with your health care provider. Document Released: 07/18/2009 Document Revised: 02/27/2016 Document Reviewed: 10/26/2014 Elsevier Interactive Patient Education  2017 [employer].

## 2023-03-23 NOTE — Progress Notes (Signed)
Subjective:   Ryan [name] is a 77 y.o. male who presents for Medicare Annual/Subsequent preventive examination.   Review of Systems     Cardiac Risk Factors include: advanced age (>53men, >3 women);obesity (BMI >30kg/m2);hypertension;dyslipidemia;male gender     Objective:    Today's Vitals   03/23/23 0859 03/23/23 0923  BP: (!) 151/81 (!) 147/82  Pulse: 85 62  Weight: 183 lb 3.2 oz (83.1 kg)   Height: 5\' 4"  (1.626 m)    Body mass index is 31.45 kg/m.     03/23/2023    8:50 AM 10/02/2022    9:39 AM 03/18/2022    3:05 PM 03/18/2022    9:12 AM 01/17/2022    4:04 PM 01/16/2022    8:43 PM 01/11/2022    9:00 PM  Advanced Directives  Does Patient Have a Medical Advance Directive? Yes Yes Yes Yes Yes Yes Yes  Type of Estate agent of [place];Living will  Living will;Healthcare Power of [employer] Power of [place];Out of facility DNR (pink MOST or yellow form);Living will Healthcare Power of [place];Living will Living will Healthcare Power of [place];Living will  Does patient want to make changes to medical advance directive? No - Patient declined   No - Patient declined No - Patient declined  No - Patient declined  Copy of Healthcare Power of Attorney in Chart? Yes - validated most recent copy scanned in chart (See row information)   Yes - validated most recent copy scanned in chart (See row information) No - copy requested  No - copy requested  Would patient like information on creating a medical advance directive?     No - Patient declined No - Patient declined     Current Medications (verified) Outpatient Encounter Medications as of 03/23/2023  Medication Sig   aspirin EC 81 MG EC tablet Take 1 tablet (81 mg total) by mouth daily. Swallow whole.   budesonide (ENTOCORT EC) 3 MG 24 hr capsule Take 2 capsules (6 mg total) by mouth daily.   cholecalciferol (VITAMIN D3) 25 MCG (1000 UNIT) tablet Take 10,000 mcg by mouth 4 (four) times a week.    diazepam (VALIUM) 5 MG tablet TAKE 1/2 TO 2 TABLETS(2.5 TO 10 MG) BY MOUTH EVERY 12 HOURS AS NEEDED FOR ANXIETY   ELIQUIS 5 MG TABS tablet TAKE 1 TABLET BY MOUTH TWICE  DAILY   Evolocumab (REPATHA SURECLICK) 140 MG/ML SOAJ Inject 140 mg into the skin every 14 (fourteen) days.   furosemide (LASIX) 20 MG tablet TAKE 1 TABLET BY MOUTH DAILY AS  NEEDED   latanoprost (XALATAN) 0.005 % ophthalmic solution Place 1 drop into the left eye at bedtime.    levalbuterol (XOPENEX HFA) 45 MCG/ACT inhaler INHALE 2 PUFFS BY MOUTH EVERY 4 HOURS AS NEEDED FOR WHEEZING   magnesium oxide (MAG-OX) 400 (240 Mg) MG tablet TAKE 1 TABLET(400 MG) BY MOUTH THREE TIMES DAILY   metoprolol tartrate (LOPRESSOR) 100 MG tablet TAKE 1 TABLET BY MOUTH TWICE  DAILY   Multiple Vitamins-Minerals (PRESERVISION AREDS 2) CAPS Take 1 tablet by mouth daily.   nitroGLYCERIN (NITROSTAT) 0.4 MG SL tablet Place 1 tablet (0.4 mg total) under the tongue every 5 (five) minutes as needed for chest pain.   omeprazole (PRILOSEC OTC) 20 MG tablet Take 20 mg by mouth daily.   Ranibizumab (LUCENTIS IO) Inject 1 Dose into the eye every 6 (six) weeks. Bilateral eye by Dr Marvis Repress   tamsulosin (FLOMAX) 0.4 MG CAPS capsule Take 0.4 mg by  mouth 2 (two) times daily.   No facility-administered encounter medications on file as of 03/23/2023.    Allergies (verified) Albumin (human), Polymyxin b-trimethoprim, Pseudoephedrine, Codeine, Guaiacol, Statins, Brimonidine tartrate, Gabapentin, Meloxicam, Peppermint flavor, Pseudoephedrine-guaifenesin, Rosuvastatin calcium, Tapentadol, Ciprofloxacin, Moxifloxacin, and Rofecoxib   History: Past Medical History:  Diagnosis Date   Allergy    Anemia    Anticoagulant long-term use    eliquis--- mananged by cardiology   BPH with urinary obstruction    CAD (coronary artery disease)    cardiologist--- dr g. taylor--- cath 04-22-2011  moderate LM stenosis, borderline sig pRCA, sig stenosis mid to distal LAD ;   aggressive medical therapy;   cath 07-21-2021 for chest pain w/ ST changes,  severe disease;  s/p CABG x4 07-22-2021   Cataract    Chronic diastolic CHF (congestive heart failure) (HCC) 03/2018   followed by cardiology   CKD (chronic kidney disease), stage III (HCC)    COPD (chronic obstructive pulmonary disease) (HCC)    followed by pcp   DOE (dyspnea on exertion)    per pt when walk >100yds,  per pt rides outside bike 4-8 miles daily without sob,  household chores and yard work without sob   Eczema    Emphysema of lung (HCC)    GERD (gastroesophageal reflux disease)    Glaucoma, left eye    Heart murmur    Hemorrhoids    History of adenomatous polyp of colon    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 Barron Alvine--- dx by biopsy 01-28-2021   Macular degeneration of both eyes    followed by dr c. 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 taylor;  first dx 06/ 2013 AFlutter w/ RVR   Vitamin D deficiency    Past Surgical History:  Procedure Laterality Date   ANTERIOR CERVICAL DECOMP/DISCECTOMY FUSION  06/08/2002   @[facility] ;  C5--C7   APPENDECTOMY  1953   CARDIAC CATHETERIZATION  04/22/2011   moderate left main and RCA  stenosis not significant by FFR and IVUS on medical therapy   CARDIOVERSION N/A 04/25/2018   Procedure: CARDIOVERSION;  Surgeon: Chilton Si, MD;  Location: [facility] ENDOSCOPY;  Service: Cardiovascular;  Laterality: N/A;   CATARACT EXTRACTION W/ INTRAOCULAR LENS IMPLANT Bilateral 2021   COLONOSCOPY WITH ESOPHAGOGASTRODUODENOSCOPY (EGD)  01/28/2021   by [email or web address]   CORONARY ARTERY BYPASS GRAFT N/A 07/22/2021   Procedure: 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, ON PUMP, USING LEFT INTERNAL MAMMARY ARTERY AND ENDOSCOPICALLY HARVESTED RIGHT GREATER SAPHENOUS VEIN;  Surgeon: Loreli Slot, MD;  Location: [facility] OR;  Service: Open Heart  Surgery;  Laterality: N/A;   ENDOVEIN HARVEST OF GREATER SAPHENOUS VEIN  07/22/2021   Procedure: ENDOVEIN HARVEST OF GREATER SAPHENOUS VEIN;  Surgeon: Loreli Slot, MD;  Location: [facility] OR;  Service: Open Heart Surgery;;   EXTRACORPOREAL SHOCK WAVE LITHOTRIPSY     x2  1990s   EYE SURGERY     FOOT SURGERY Right 1970   calcification removed from top of foot   GOLD SEED IMPLANT N/A 11/28/2021   Procedure: GOLD SEED IMPLANT;  Surgeon: Jerilee Field, MD;  Location: [facility];  Service: Urology;  Laterality: N/A;   JOINT REPLACEMENT     KNEE ARTHROSCOPY Right 1991   LAPAROSCOPIC CHOLECYSTECTOMY  2000   LEFT HEART CATH AND CORONARY ANGIOGRAPHY N/A 07/21/2021   Procedure: LEFT HEART CATH AND CORONARY ANGIOGRAPHY;  Surgeon: Runell Gess, MD;  Location: [facility] INVASIVE CV LAB;  Service: Cardiovascular;  Laterality: N/A;   SPACE OAR INSTILLATION N/A 11/28/2021   Procedure: SPACE OAR INSTILLATION;  Surgeon: Jerilee Field, MD;  Location: [facility];  Service: Urology;  Laterality: N/A;   TEE WITHOUT CARDIOVERSION N/A 07/22/2021   Procedure: TRANSESOPHAGEAL ECHOCARDIOGRAM (TEE);  Surgeon: Loreli Slot, MD;  Location: [facility] OR;  Service: Open Heart Surgery;  Laterality: N/A;   TOTAL KNEE ARTHROPLASTY Right 08/14/2009    @[facility]    TRANSURETHRAL RESECTION OF PROSTATE N/A 07/17/2021   Procedure: TRANSURETHRAL RESECTION OF THE PROSTATE (TURP);  Surgeon: Marcine Matar, MD;  Location: [facility];  Service: Urology;  Laterality: N/A;  1 HR   Family History  Problem Relation Age of Onset   Heart disease Mother    Diabetes Mother    Cirrhosis Mother    Emphysema Mother        never smoked but 2nd hand through her spouse   Hypertension Mother    Macular degeneration Mother    Heart disease Father    Cancer Father        prostate   Hyperlipidemia Father    Hypertension Father    Varicose Veins Father    Heart attack Father    Peripheral vascular disease Father    Heart disease Sister    Arthritis Sister    Hyperlipidemia Sister    Obesity Sister    Macular degeneration Sister    Heart disease Brother        5 stents   Hyperlipidemia Brother    Macular degeneration Maternal Grandfather    Cirrhosis Sister    Obesity Sister    Arthritis Sister    Heart disease Sister    Obesity Sister    Liver disease Other    Prostate cancer Other    Coronary artery disease Other    Colon cancer Neg Hx    Esophageal cancer Neg Hx    Rectal cancer Neg Hx    Stomach cancer Neg Hx    Social History   Socioeconomic History   Marital status: Married    Spouse name: [name]   Number of children: 0   Years of education: Not on file   Highest education level: Associate degree: academic program  Occupational History   Occupation: retired    Associate Professor: RETIRED  Tobacco Use   Smoking status: Former    Packs/day: 0.50    Years: 50.00    Additional pack years: 0.00    Total pack years: 25.00    Types: Cigarettes    Quit date: 05/01/2009    Years since quitting: 13.9  Smokeless tobacco: Former    Types: Chew    Quit date: 1995   Tobacco comments:    Former smoker 01/27/22  Vaping Use   Vaping Use: Never used  Substance and Sexual Activity   Alcohol use: Yes    Alcohol/week: 2.0  standard drinks of alcohol    Types: 2 Cans of beer per week    Comment: 1 beer daily 2 days a week 01/27/22   Drug use: Never   Sexual activity: Yes    Comment: lives with wife, no dietary restrictions  Other Topics Concern   Not on file  Social History Narrative   Lives with wife   Social Determinants of Health   Financial Resource Strain: Low Risk  (02/02/2023)   Overall Financial Resource Strain (CARDIA)    Difficulty of Paying Living Expenses: Not hard at all  Food Insecurity: No Food Insecurity (02/02/2023)   Hunger Vital Sign    Worried About Running Out of Food in the Last Year: Never true    Ran Out of Food in the Last Year: Never true  Transportation Needs: No Transportation Needs (02/02/2023)   PRAPARE - Administrator, Civil Service (Medical): No    Lack of Transportation (Non-Medical): No  Physical Activity: Insufficiently Active (02/02/2023)   Exercise Vital Sign    Days of Exercise per Week: 2 days    Minutes of Exercise per Session: 30 min  Stress: No Stress Concern Present (02/02/2023)   [employer] of Occupational Health - Occupational Stress Questionnaire    Feeling of Stress : Only a little  Social Connections: Moderately Integrated (02/02/2023)   Social Connection and Isolation Panel [NHANES]    Frequency of Communication with Friends and Family: Twice a week    Frequency of Social Gatherings with Friends and Family: Twice a week    Attends Religious Services: Never    Database administrator or Organizations: Yes    Attends Banker Meetings: Never    Marital Status: Married    Tobacco Counseling Counseling given: Not Answered Tobacco comments: Former smoker 01/27/22   Clinical Intake:  Pre-visit preparation completed: Yes  Pain : No/denies pain  BMI - recorded: 31.45 Nutritional Status: BMI > 30  Obese Nutritional Risks: None Diabetes: No  How often do you need to have someone help you when you read instructions,  pamphlets, or other written materials from your doctor or pharmacy?: 1 - Never  Interpreter Needed?: No  Information entered by :: [employer]   Activities of Daily Living    03/23/2023    9:02 AM  In your present state of health, do you have any difficulty performing the following activities:  Hearing? 0  Vision? 1  Comment macular degeneration  Difficulty concentrating or making decisions? 0  Walking or climbing stairs? 1  Dressing or bathing? 0  Doing errands, shopping? 1  Comment wife Insurance claims handler and eating ? N  Using the Toilet? N  In the past six months, have you accidently leaked urine? Y  Do you have problems with loss of bowel control? N  Managing your Medications? N  Managing your Finances? N  Housekeeping or managing your Housekeeping? Y  Comment wife assists    Patient Care Team: [name], MD as PCP - General (Family Medicine) Marinus Maw, MD as PCP - Cardiology (Cardiology) Shirlean Kelly, MD as Consulting Physician (Neurosurgery) Marinus Maw, MD as Consulting Physician (Cardiology) Marvis Repress, MD as  Referring Physician (Ophthalmology) Cherlyn Cushing, RN as Oncology Nurse Navigator Marcine Matar, MD as Consulting Physician (Urology) Margaretmary Dys, MD as Consulting Physician (Radiation Oncology) Axel Filler Larna Daughters, NP as Nurse Practitioner (Hematology and Oncology) Maryclare Labrador, RN as Registered Nurse  Indicate any recent Medical Services you may have received from other than [facility] providers in the past year (date may be approximate).     Assessment:   This is a routine wellness examination for [place].  Hearing/Vision screen No results found.  Dietary issues and exercise activities discussed:     Goals Addressed   None    Depression Screen    03/23/2023    9:12 AM 02/08/2023    2:57 PM 10/27/2022    3:13 PM 05/21/2022    3:31 PM 05/21/2022    3:30 PM 03/18/2022    9:15 AM 01/29/2022    2:44 PM  PHQ 2/9  Scores  PHQ - 2 Score 0 0 0 0 0 0 0  PHQ- 9 Score  0  0       Fall Risk    03/23/2023    9:11 AM 02/08/2023    2:57 PM 10/27/2022    3:13 PM 05/21/2022    3:32 PM 05/21/2022    3:30 PM  Fall Risk   Falls in the past year? 0 0 0 0 0  Number falls in past yr: 0 0 0 0 0  Injury with Fall? 0 0 0 0 0  Risk for fall due to : Impaired balance/gait      Follow up Falls evaluation completed Falls evaluation completed Falls evaluation completed      MEDICARE RISK AT HOME:  Medicare Risk at Home - 03/23/23 0851     Any stairs in or around the home? Yes    If so, are there any without handrails? No    Home free of loose throw rugs in walkways, pet beds, electrical cords, etc? Yes    Adequate lighting in your home to reduce risk of falls? Yes    Life alert? No    Use of a cane, walker or w/c? Yes    Grab bars in the bathroom? No    Shower chair or bench in shower? No    Elevated toilet seat or a handicapped toilet? No             TIMED UP AND GO:  Was the test performed?  Yes  Length of time to ambulate 10 feet: 9 sec Gait slow and steady with assistive device    Cognitive Function:        03/23/2023    9:15 AM 03/18/2022    9:37 AM  6CIT Screen  What Year? 0 points 0 points  What month? 0 points 0 points  What time? 0 points 0 points  Count back from 20 0 points 0 points  Months in reverse 0 points 0 points  Repeat phrase 0 points 0 points  Total Score 0 points 0 points    Immunizations Immunization History  Administered Date(s) Administered   Fluad Quad(high Dose 65+) 06/24/2020   Influenza Split 10/05/2000, 07/18/2009, 06/20/2010, 07/06/2011, 07/05/2012, 06/22/2013, 06/25/2014, 05/28/2018, 05/31/2019   Influenza Whole 10/05/2000, 07/18/2009, 06/20/2010, 07/05/2012   Influenza, High Dose Seasonal PF 06/23/2016, 06/21/2017, 05/28/2018, 05/31/2019, 06/24/2020, 05/29/2022   Influenza,inj,Quad PF,6+ Mos 06/25/2014   Influenza-Unspecified 05/28/2018, 06/19/2021,  08/05/2021   PFIZER(Purple Top)SARS-COV-2 Vaccination 10/28/2019, 11/18/2019, 07/24/2020, 02/21/2021   Pfizer Covid-19 Vaccine Bivalent Booster 82yrs & up 08/21/2021, 07/19/2022  Pneumococcal Conjugate-13 12/01/2013, 06/10/2016   Pneumococcal Polysaccharide-23 07/06/2007, 06/23/2016   Pneumococcal-Unspecified 08/05/2021   RSV,unspecified 07/19/2022   Td 10/05/2002   Tdap 05/06/2012, 05/29/2022   Zoster Recombinat (Shingrix) 06/28/2018, 10/12/2018   Zoster, Live 03/27/2009   Zoster, Unspecified 06/28/2018, 10/12/2018    TDAP status: Up to date  Flu Vaccine status: Up to date  Pneumococcal vaccine status: Up to date  Covid-19 vaccine status: Information provided on how to obtain vaccines.   Qualifies for Shingles Vaccine? Yes   Zostavax completed Yes   Shingrix Completed?: Yes  Screening Tests Health Maintenance  Topic Date Due   Lung Cancer Screening  01/18/2023   Medicare Annual Wellness (AWV)  03/19/2023   COVID-19 Vaccine (7 - 2023-24 season) 06/04/2023 (Originally 09/13/2022)   INFLUENZA VACCINE  05/06/2023   DTaP/Tdap/Td (4 - Td or Tdap) 05/29/2032   Pneumonia Vaccine 6+ Years old  Completed   Hepatitis C Screening  Completed   Zoster Vaccines- Shingrix  Completed   HPV VACCINES  Aged Out   Colonoscopy  Discontinued    Health Maintenance  Health Maintenance Due  Topic Date Due   Lung Cancer Screening  01/18/2023   Medicare Annual Wellness (AWV)  03/19/2023    Colorectal cancer screening: No longer required.   Lung Cancer Screening: (Low Dose CT Chest recommended if Age 16-80 years, 20 pack-year currently smoking OR have quit w/in 15years.) does qualify.   Lung Cancer Screening Referral: already in system  Additional Screening:  Hepatitis C Screening: does qualify; Completed 05/19/16  Vision Screening: Recommended annual ophthalmology exams for early detection of glaucoma and other disorders of the eye. Is the patient up to date with their annual eye  exam?  Yes  Who is the provider or what is the name of the office in which the patient attends annual eye exams? Dr Leonia Corona If pt is not established with a provider, would they like to be referred to a provider to establish care? No .   Dental Screening: Recommended annual dental exams for proper oral hygiene   Community Resource Referral / Chronic Care Management: CRR required this visit?  No   CCM required this visit?  No     Plan:     I have personally reviewed and noted the following in the patient's chart:   Medical and social history Use of alcohol, tobacco or illicit drugs  Current medications and supplements including opioid prescriptions. Patient is not currently taking opioid prescriptions. Functional ability and status Nutritional status Physical activity Advanced directives List of other physicians Hospitalizations, surgeries, and ER visits in previous 12 months Vitals Screenings to include cognitive, depression, and falls Referrals and appointments  In addition, I have reviewed and discussed with patient certain preventive protocols, quality metrics, and best practice recommendations. A written personalized care plan for preventive services as well as general preventive health recommendations were provided to patient.     Donne Anon, CMA   03/23/2023   After Visit Summary: printed for pt  Nurse Notes: None

## 2023-03-25 DIAGNOSIS — C44329 Squamous cell carcinoma of skin of other parts of face: Secondary | ICD-10-CM | POA: Diagnosis not present

## 2023-03-29 ENCOUNTER — Other Ambulatory Visit: Payer: Self-pay | Admitting: Gastroenterology

## 2023-03-31 NOTE — Progress Notes (Unsigned)
Referring Physician:  [name], MD [name] [place],  [place] [address]  Primary Physician:  [name], MD  History of Present Illness: 03/31/2023 [name] is here today with a chief complaint of bilateral buttock and posterior thigh pain when he walks.  He has been having this for some time, but is been worsening over time and making it harder for him to get about his day-to-day life.  He is bending forward when he walks.  He has to get a cart when he walks in the store.  His legs feel tired and he has to sit to improve his pain.  He has tried physical therapy without improvement.    Ryan [name]'s note: 02/25/2023 [name] has a history of BPH, CAD, heart failure, CKD 3, COPD, GERD, TIA, HTN, prostate CA, osteoporosis, PAD, CVA, and afib.    He has seen Dr. Jule Ser in the past.    He has chronic constant LBP with posterior bilateral leg pain to his knees, occasionally to his feet. Buttock pain is his worst pain. Pain is worse with walking. Some relief with grocery cart and tylenol. No numbness or tingling in his legs. His legs feel heavy and fatigued. They feel tired. He has weakness in the legs.    He is on ELIQUIS.    Bowel/Bladder Dysfunction: none   Conservative measures:  Physical therapy: [facility] initial eval for back on 06/24/22- he thinks he did 5-6 weeks of PT and does HEP.  Multimodal medical therapy including regular antiinflammatories: Tylenol Injections:  Had ESIs in the past, none in last 3 years.    Past Surgery:  ACDF C5-C7 2003      The symptoms are causing a significant impact on the patient's life.   I have utilized the care everywhere function in epic to review the outside records available from external health systems.  Review of Systems:  A 10 point review of systems is negative, except for the pertinent positives and negatives detailed in the HPI.  Past Medical History: Past Medical History:   Diagnosis Date   Allergy    Anemia    Anticoagulant long-term use    eliquis--- mananged by cardiology   BPH with urinary obstruction    CAD (coronary artery disease)    cardiologist--- dr g. taylor--- cath 04-22-2011  moderate LM stenosis, borderline sig pRCA, sig stenosis mid to distal LAD ;  aggressive medical therapy;   cath 07-21-2021 for chest pain w/ ST changes,  severe disease;  s/p CABG x4 07-22-2021   Cataract    Chronic diastolic CHF (congestive heart failure) (HCC) 03/2018   followed by cardiology   CKD (chronic kidney disease), stage III (HCC)    COPD (chronic obstructive pulmonary disease) (HCC)    followed by pcp   DOE (dyspnea on exertion)    per pt when walk >100yds,  per pt rides outside bike 4-8 miles daily without sob,  household chores and yard work without sob   Eczema    Emphysema of lung (HCC)    GERD (gastroesophageal reflux disease)    Glaucoma, left eye    Heart murmur    Hemorrhoids    History of adenomatous polyp of colon    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 Barron Alvine--- dx by biopsy 01-28-2021   Macular degeneration of both eyes    followed by dr c. Gwendalyn Ege--- bilateral eye injection every 6 wks   Malignant neoplasm prostate [facility]) 07/2021   urologist-- dr Retta Diones;  dx 10/ 2022,  Gleason 5+5, PSA 1.8   Osteoporosis 05/31/2016   PAF (paroxysmal atrial fibrillation) (HCC) 03/2012    cardiologist-- dr g. taylor;  first dx 06/ 2013 AFlutter w/ RVR   Vitamin D deficiency     Past Surgical History: Past Surgical History:  Procedure Laterality Date   ANTERIOR CERVICAL DECOMP/DISCECTOMY FUSION  06/08/2002   @[facility] ;  C5--C7   APPENDECTOMY  1953   CARDIAC CATHETERIZATION  04/22/2011   moderate left main and RCA stenosis not significant by FFR and IVUS on medical therapy   CARDIOVERSION N/A 04/25/2018   Procedure: CARDIOVERSION;  Surgeon: Chilton Si, MD;  Location: [facility] ENDOSCOPY;  Service: Cardiovascular;  Laterality: N/A;   CATARACT EXTRACTION W/ INTRAOCULAR LENS IMPLANT Bilateral 2021   COLONOSCOPY WITH ESOPHAGOGASTRODUODENOSCOPY (EGD)  01/28/2021   by ZOXWRU   CORONARY ARTERY BYPASS GRAFT N/A 07/22/2021   Procedure: 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, ON PUMP, USING LEFT INTERNAL MAMMARY ARTERY AND ENDOSCOPICALLY HARVESTED RIGHT GREATER SAPHENOUS VEIN;  Surgeon: Loreli Slot, MD;  Location: [facility] OR;  Service: Open Heart Surgery;  Laterality: N/A;   ENDOVEIN HARVEST OF GREATER SAPHENOUS VEIN  07/22/2021   Procedure: ENDOVEIN HARVEST OF GREATER SAPHENOUS VEIN;  Surgeon: Loreli Slot, MD;  Location: [facility] OR;  Service: Open Heart Surgery;;   EXTRACORPOREAL SHOCK WAVE LITHOTRIPSY     x2  1990s   EYE SURGERY     FOOT SURGERY Right 1970   calcification removed from top of foot   GOLD SEED IMPLANT N/A 11/28/2021   Procedure: GOLD SEED IMPLANT;  Surgeon: Jerilee Field, MD;  Location: [facility];  Service: Urology;  Laterality: N/A;   JOINT REPLACEMENT     KNEE ARTHROSCOPY Right 1991   LAPAROSCOPIC CHOLECYSTECTOMY  2000   LEFT HEART CATH AND CORONARY ANGIOGRAPHY N/A 07/21/2021   Procedure: LEFT HEART CATH AND CORONARY ANGIOGRAPHY;  Surgeon: Runell Gess, MD;  Location: [facility] INVASIVE CV LAB;  Service: Cardiovascular;  Laterality: N/A;   SPACE OAR INSTILLATION N/A 11/28/2021   Procedure: SPACE OAR INSTILLATION;  Surgeon:  Jerilee Field, MD;  Location: [facility];  Service: Urology;  Laterality: N/A;   TEE WITHOUT CARDIOVERSION N/A 07/22/2021   Procedure: TRANSESOPHAGEAL ECHOCARDIOGRAM (TEE);  Surgeon: Loreli Slot, MD;  Location: [facility] OR;  Service: Open Heart Surgery;  Laterality: N/A;   TOTAL KNEE ARTHROPLASTY Right 08/14/2009   @[facility]    TRANSURETHRAL RESECTION OF PROSTATE N/A 07/17/2021   Procedure: TRANSURETHRAL RESECTION OF THE PROSTATE (TURP);  Surgeon: Marcine Matar, MD;  Location: [facility];  Service: Urology;  Laterality: N/A;  1 HR    Allergies: Allergies as of 04/01/2023 - Review Complete 03/23/2023  Allergen Reaction Noted   Albumin (human) Anaphylaxis  04/12/2020   Polymyxin b-trimethoprim Swelling 01/28/2012   Pseudoephedrine Other (See Comments) 03/21/2014   Codeine Hives, Itching, and Rash 03/21/2007   Guaiacol Other (See Comments) 06/08/2013   Statins Other (See Comments) 03/21/2007   Brimonidine tartrate Itching and Swelling 10/27/2022   Gabapentin Other (See Comments) 01/28/2021   Meloxicam Other (See Comments) 11/07/2010   Peppermint flavor Other (See Comments) 07/25/2021   Pseudoephedrine-guaifenesin Nausea And Vomiting 02/25/2014   Rosuvastatin calcium Other (See Comments) 04/26/2014   Tapentadol Other (See Comments) 11/07/2010   Ciprofloxacin Hives, Itching, Nausea Only, and Rash 03/21/2007   Moxifloxacin Nausea Only and Other (See Comments) 11/07/2010   Rofecoxib Other (See Comments) 06/08/2013    Medications:  Current Outpatient Medications:    aspirin EC 81 MG EC tablet, Take 1 tablet (81 mg total) by mouth daily. Swallow whole., Disp: 30 tablet, Rfl: 11   budesonide (ENTOCORT EC) 3 MG 24 hr capsule, TAKE 2 CAPSULES(6 MG) BY MOUTH DAILY, Disp: 180 capsule, Rfl: 1   cholecalciferol (VITAMIN D3) 25 MCG (1000 UNIT) tablet, Take 10,000 mcg by mouth 4 (four) times a week., Disp: , Rfl:    diazepam (VALIUM) 5 MG tablet, TAKE 1/2 TO 2  TABLETS(2.5 TO 10 MG) BY MOUTH EVERY 12 HOURS AS NEEDED FOR ANXIETY, Disp: 30 tablet, Rfl: 1   ELIQUIS 5 MG TABS tablet, TAKE 1 TABLET BY MOUTH TWICE  DAILY, Disp: 180 tablet, Rfl: 3   Evolocumab (REPATHA SURECLICK) 140 MG/ML SOAJ, Inject 140 mg into the skin every 14 (fourteen) days., Disp: 6 mL, Rfl: 3   furosemide (LASIX) 20 MG tablet, TAKE 1 TABLET BY MOUTH DAILY AS  NEEDED, Disp: 90 tablet, Rfl: 3   latanoprost (XALATAN) 0.005 % ophthalmic solution, Place 1 drop into the left eye at bedtime. , Disp: , Rfl:    levalbuterol (XOPENEX HFA) 45 MCG/ACT inhaler, INHALE 2 PUFFS BY MOUTH EVERY 4 HOURS AS NEEDED FOR WHEEZING, Disp: 45 g, Rfl: 2   magnesium oxide (MAG-OX) 400 (240 Mg) MG tablet, TAKE 1 TABLET(400 MG) BY MOUTH THREE TIMES DAILY, Disp: 270 tablet, Rfl: 1   metoprolol tartrate (LOPRESSOR) 100 MG tablet, TAKE 1 TABLET BY MOUTH TWICE  DAILY, Disp: 180 tablet, Rfl: 3   Multiple Vitamins-Minerals (PRESERVISION AREDS 2) CAPS, Take 1 tablet by mouth daily., Disp: , Rfl:    nitroGLYCERIN (NITROSTAT) 0.4 MG SL tablet, Place 1 tablet (0.4 mg total) under the tongue every 5 (five) minutes as needed for chest pain., Disp: 30 tablet, Rfl: 0   omeprazole (PRILOSEC OTC) 20 MG tablet, Take 20 mg by mouth daily., Disp: , Rfl:    Ranibizumab (LUCENTIS IO), Inject 1 Dose into the eye every 6 (six) weeks. Bilateral eye by Dr Marvis Repress, Disp: , Rfl:    tamsulosin (FLOMAX) 0.4 MG CAPS capsule, Take 0.4 mg by mouth 2 (two) times daily., Disp: , Rfl:   Social History: Social History   Tobacco Use   Smoking status: Former    Packs/day: 0.50    Years: 50.00    Additional pack years: 0.00    Total pack years: 25.00    Types: Cigarettes    Quit date: 05/01/2009    Years since quitting: 13.9   Smokeless tobacco: Former    Types: Chew    Quit date: 1995   Tobacco comments:    Former smoker 01/27/22  Vaping Use   Vaping Use: Never used  Substance Use Topics   Alcohol use: Yes    Alcohol/week: 2.0  standard  drinks of alcohol    Types: 2 Cans of beer per week    Comment: 1 beer daily 2 days a week 01/27/22   Drug use: Never    Family Medical History: Family History  Problem Relation Age of Onset   Heart disease Mother    Diabetes Mother    Cirrhosis Mother    Emphysema Mother        never smoked but 2nd hand through her spouse   Hypertension Mother    Macular degeneration Mother    Heart disease Father    Cancer Father        prostate   Hyperlipidemia Father    Hypertension Father    Varicose Veins Father    Heart attack Father    Peripheral vascular disease Father    Heart disease Sister    Arthritis Sister    Hyperlipidemia Sister    Obesity Sister    Macular degeneration Sister    Heart disease Brother        5 stents   Hyperlipidemia Brother    Macular degeneration Maternal Grandfather    Cirrhosis Sister    Obesity Sister    Arthritis Sister    Heart disease Sister    Obesity Sister    Liver disease Other    Prostate cancer Other    Coronary artery disease Other    Colon cancer Neg Hx    Esophageal cancer Neg Hx    Rectal cancer Neg Hx    Stomach cancer Neg Hx     Physical Examination: There were no vitals filed for this visit.  General: Patient is in no apparent distress. Attention to examination is appropriate.  Neck:   Supple.  Full range of motion.  Respiratory: Patient is breathing without any difficulty.   NEUROLOGICAL:     Awake, alert, oriented to person, place, and time.  Speech is clear and fluent.   Cranial Nerves: Pupils equal round and reactive to light.  Facial tone is symmetric.  Facial sensation is symmetric. Shoulder shrug is symmetric. Tongue protrusion is midline.  There is no pronator drift.  Strength: Side Biceps Triceps Deltoid Interossei Grip Wrist Ext. Wrist Flex.  R 5 5 5 5 5 5 5   L 5 5 5 5 5 5 5    Side Iliopsoas Quads Hamstring PF DF EHL  R 5 5 5 5 5 5   L 5 5 5 5 5 5    Reflexes are 1+ and symmetric at the  biceps, triceps, brachioradialis, 3+ patella and 1+ achilles.   Hoffman's is absent.   Bilateral upper and lower extremity sensation is intact to light touch.    No evidence of dysmetria noted.  Gait is abnormal and requires a cane.     Medical Decision Making  Imaging: MRI L spine 02/28/23 Disc levels:   No disc level abnormality at L2-3 or above.   L3-4: Minimal disc bulge. Minimal facet and ligamentous hypertrophy. No compressive stenosis.   L4-5: Moderate disc bulge. Mild facet and ligamentous hypertrophy. Mild multifactorial stenosis with potential for neural compression in either or both lateral recesses. This has worsened slightly since 2020.   L5-S1: Mild bulging of the disc. No compressive canal or foraminal narrowing.   IMPRESSION: 1. L4-5: Moderate disc bulge. Mild facet and ligamentous hypertrophy. Mild multifactorial stenosis with potential for neural compression in either or both lateral recesses. This has worsened slightly since 2020. 2. L3-4: Minimal disc bulge. Mild facet and ligamentous hypertrophy. No compressive stenosis.  3. L5-S1: Mild disc bulge. No compressive stenosis. 4. Post radiation fatty change of the marrow. No sign of regional osseous metastatic disease.     Electronically Signed   By: Paulina Fusi M.D.   On: 03/08/2023 15:43  I have personally reviewed the images and agree with the above interpretation.  Assessment and Plan: Mr. Campanelli is a pleasant 77 y.o. male with symptoms of neurogenic claudication.  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  However, that is longstanding.  His more recent issue is the bilateral buttock pain.    He has tried physical therapy for this without improvement.  At this point, I think an L4-5 lumbar decompression will be worthwhile to try to improve his pain.  We reviewed that injections were also an option, but he would like to proceed  with surgery.  I discussed the planned procedure at length with the patient, including the risks, benefits, alternatives, and indications. The risks discussed include but are not limited to bleeding, infection, need for reoperation, spinal fluid leak, stroke, vision loss, anesthetic complication, coma, paralysis, and even death. I also described in detail that improvement was not guaranteed.  The patient expressed understanding of these risks, and asked that we proceed with surgery. I described the surgery in layman's terms, and gave ample opportunity for questions, which were answered to the best of my ability.   Thank you for involving me in the care of this patient.      Carlene Bickley K. Myer Haff MD, [facility] Neurosurgery

## 2023-04-01 ENCOUNTER — Ambulatory Visit: Payer: Medicare Other | Admitting: Internal Medicine

## 2023-04-01 ENCOUNTER — Ambulatory Visit (INDEPENDENT_AMBULATORY_CARE_PROVIDER_SITE_OTHER): Payer: Medicare Other | Admitting: Neurosurgery

## 2023-04-01 ENCOUNTER — Telehealth: Payer: Self-pay | Admitting: *Deleted

## 2023-04-01 ENCOUNTER — Encounter: Payer: Self-pay | Admitting: Neurosurgery

## 2023-04-01 VITALS — BP 140/80 | Ht 64.0 in | Wt 183.0 lb

## 2023-04-01 DIAGNOSIS — M48062 Spinal stenosis, lumbar region with neurogenic claudication: Secondary | ICD-10-CM | POA: Diagnosis not present

## 2023-04-01 DIAGNOSIS — M48061 Spinal stenosis, lumbar region without neurogenic claudication: Secondary | ICD-10-CM

## 2023-04-01 NOTE — Telephone Encounter (Signed)
   Name: ZHYON ANTENUCCI  DOB: Nov 27, 1945  MRN: [id number]  Primary Cardiologist: Lewayne Bunting, MD  Chart reviewed as part of pre-operative protocol coverage. The patient has an upcoming visit scheduled with Dr. Ladona Ridgel on 04/19/23 at which time clearance can be addressed in case there are any issues that would impact surgical recommendations.   I added preop FYI to appointment note so that provider is aware to address at time of outpatient visit.  Per office protocol the cardiology provider should forward their finalized clearance decision and recommendations regarding antiplatelet therapy to the requesting party below.    This message will also be routed to pharmacy pool  for input on holding Eliquis as requested below so that this information is available to the clearing provider at time of patient's appointment.   I will route this message as FYI to requesting party and remove this message from the preop box as separate preop APP input not needed at this time.   Please call with any questions.  Napoleon Form, Leodis Rains, NP  04/01/2023, 3:14 PM

## 2023-04-01 NOTE — Telephone Encounter (Signed)
Patient with diagnosis of afib on Eliquis for anticoagulation.    Procedure: L4-5 DECOMPRESSION  Date of procedure: 05/17/2023   CHA2DS2-VASc Score = 7   This indicates a 11.2% annual risk of stroke. The patient's score is based upon: CHF History: 1 HTN History: 1 Diabetes History: 0 Stroke History: 2 Vascular Disease History: 1 Age Score: 2 Gender Score: 0     CrCl 69 mL/min (SrCr 0.89 02/08/2023 ) Platelet count 177 K ( 02/08/2023)   Patient had TIA 03/23/21.  Previously  DOAC were held for 2 days prior to the procedure, typically hold Eliquis for 3 days prior to spinal procedure, however patient is at elevated risk off of anticoagulation based on history of afib and TIA. Will forward to MD to confirm 3 day hold is acceptable and whether patient need bridging or not.     **This guidance is not considered finalized until pre-operative APP has relayed final recommendations.**

## 2023-04-01 NOTE — Telephone Encounter (Signed)
   Pre-operative Risk Assessment    Patient Name: Ryan [name]  DOB: July 05, 1946 MRN: [id number]      Request for Surgical Clearance    Procedure:   L4-5 DECOMPRESSION  Date of Surgery:  Clearance 05/17/23                                 Surgeon:  DR. Venetia Night Surgeon's Group or Practice Name:  [facility] NE[facility] [place] Phone number:  [phone]  Fax number:  [phone]   Type of Clearance Requested:   - Medical  - Pharmacy:  Hold Apixaban (Eliquis) x 72 HOURS PRIOR AND RESUME x 14 DAYS POST SURGERY; PT CAN STAY ON ASA   Type of Anesthesia:  General    Additional requests/questions:    [name]   04/01/2023, 3:05 PM

## 2023-04-01 NOTE — Patient Instructions (Signed)
Please see below for information in regards to your upcoming surgery:   Planned surgery: L4-5 decompression   Surgery date: 05/17/23 at [facility] - you will find out your arrival time the business day before your surgery.   Pre-op appointment at [facility] Pre-admit Testing: we will call you with a date/time for this. Pre-admit testing is located on the first floor of the [facility] building, [address] [facility] [address]. Please bring all prescriptions in the original prescription bottles to your appointment, even if you have reviewed medications by phone with a pharmacy representative. During this appointment, they will advise you which medications you can take the morning of surgery, and which medications you will need to hold for surgery. Pre-op labs may be done at your pre-op appointment. You are not required to fast for these labs. Should you need to change your pre-op appointment, please call Pre-admit testing at [phone].    Surgical clearance: we will send a clearance form to Dr Danise Edge and Dr Lewayne Bunting     Blood thinners:   Aspirin:  ok to stay on aspirin 81mg    Eliquis:   stop Eliquis 72 hours prior, resume Eliquis 14 days after    Common restrictions after surgery: No bending, lifting, or twisting ("BLT"). Avoid lifting objects heavier than 10 pounds for the first 6 weeks after surgery. Where possible, avoid household activities that involve lifting, bending, reaching, pushing, or pulling such as laundry, vacuuming, grocery shopping, and childcare. Try to arrange for help from friends and family for these activities while your back heals. Do not drive while taking prescription pain medication. Weeks 6 through 12 after surgery: avoid lifting more than 25 pounds.     How to contact us:  If you have any questions/concerns before or after surgery, you can reach [place] at [phone], or you can send a mychart message. We can be reached  by phone or mychart 8am-4pm, Monday-Friday.  *Please note: Calls after 4pm are forwarded to a third party answering service. Mychart messages are not routinely monitored during evenings, weekends, and holidays. Please call our office to contact the answering service for urgent concerns during non-business hours.    Appointments/FMLA & disability paperwork: Patty [employer] Cristin  Nurse: Royston Cowper  Medical assistants: Laurann Montana Physician Assistant's: Manning Charity & Drake Leach Surgeon: Venetia Night, MD

## 2023-04-15 ENCOUNTER — Other Ambulatory Visit: Payer: Self-pay

## 2023-04-15 DIAGNOSIS — M48061 Spinal stenosis, lumbar region without neurogenic claudication: Secondary | ICD-10-CM

## 2023-04-15 DIAGNOSIS — Z01818 Encounter for other preprocedural examination: Secondary | ICD-10-CM

## 2023-04-19 ENCOUNTER — Ambulatory Visit: Payer: Medicare Other | Attending: Internal Medicine | Admitting: Internal Medicine

## 2023-04-19 ENCOUNTER — Encounter: Payer: Self-pay | Admitting: Internal Medicine

## 2023-04-19 ENCOUNTER — Other Ambulatory Visit (HOSPITAL_BASED_OUTPATIENT_CLINIC_OR_DEPARTMENT_OTHER): Payer: Self-pay

## 2023-04-19 ENCOUNTER — Encounter: Payer: Self-pay | Admitting: *Deleted

## 2023-04-19 VITALS — BP 140/86 | HR 75 | Ht 64.0 in | Wt 185.0 lb

## 2023-04-19 DIAGNOSIS — I2510 Atherosclerotic heart disease of native coronary artery without angina pectoris: Secondary | ICD-10-CM

## 2023-04-19 DIAGNOSIS — Z01810 Encounter for preprocedural cardiovascular examination: Secondary | ICD-10-CM | POA: Diagnosis not present

## 2023-04-19 DIAGNOSIS — I10 Essential (primary) hypertension: Secondary | ICD-10-CM

## 2023-04-19 DIAGNOSIS — R0602 Shortness of breath: Secondary | ICD-10-CM

## 2023-04-19 NOTE — Progress Notes (Signed)
Called patient to let him know that per Dr. Ladona Ridgel he also needs a [facility] and that he will be contacted to schedule the test.  Orders placed.  Instructions reviewed and will also be sent to the patient portal for his review.  Pt back surgery is scheduled for 05/17/23.

## 2023-04-19 NOTE — Progress Notes (Signed)
HPI Ryan [name] returns today for followup. He is a pleasant 77 yo man with CAD s/p CABG, PAF now persistent since stopping his propafenone. He has a h/o prostate CA and has undergone surgery, chemo and XRT. He is improving. He checks his HR with a pulse ox and his bp cuff. He has been sedentary. He notes pain in his legs and is felt to need spine surgery in the coming weeks. The patient has had worsening sob. He did not have angina prior to his CABG. He does have a h/o stroke several years ago.  Allergies  Allergen Reactions   Albumin (Human) Anaphylaxis   Polymyxin B-Trimethoprim Swelling    Eye drops made eyes swell   Pseudoephedrine Other (See Comments)    Stomach cramps   Codeine Hives, Itching and Rash   Guaiacol Other (See Comments)    Hallucinations   Statins Other (See Comments)    Muscle cramps    Brimonidine Tartrate Itching and Swelling   Gabapentin Other (See Comments)    Pt unsure of sensitivity   Meloxicam Other (See Comments)    Pt unsure of sensitivity   Peppermint Flavor Other (See Comments)    Severe cramping   Pseudoephedrine-Guaifenesin Nausea And Vomiting    Stomach cramps   Rosuvastatin Calcium Other (See Comments)    Muscle aches   Tapentadol Other (See Comments)    Pt unsure of sensitivity   Ciprofloxacin Hives, Itching, Nausea Only and Rash   Moxifloxacin Nausea Only and Other (See Comments)    Headaches, stomach cramps    Rofecoxib Other (See Comments)    Stomach cramping     Current Outpatient Medications  Medication Sig Dispense Refill   aspirin EC 81 MG EC tablet Take 1 tablet (81 mg total) by mouth daily. Swallow whole. 30 tablet 11   budesonide (ENTOCORT EC) 3 MG 24 hr capsule TAKE 2 CAPSULES(6 MG) BY MOUTH DAILY 180 capsule 1   cholecalciferol (VITAMIN D3) 25 MCG (1000 UNIT) tablet Take 10,000 mcg by mouth 4 (four) times a week.     diazepam (VALIUM) 5 MG tablet TAKE 1/2 TO 2 TABLETS(2.5 TO 10 MG) BY MOUTH EVERY 12 HOURS AS NEEDED  FOR ANXIETY 30 tablet 1   ELIQUIS 5 MG TABS tablet TAKE 1 TABLET BY MOUTH TWICE  DAILY 180 tablet 3   Evolocumab (REPATHA SURECLICK) 140 MG/ML SOAJ Inject 140 mg into the skin every 14 (fourteen) days. 6 mL 3   furosemide (LASIX) 20 MG tablet TAKE 1 TABLET BY MOUTH DAILY AS  NEEDED 90 tablet 3   latanoprost (XALATAN) 0.005 % ophthalmic solution Place 1 drop into the left eye at bedtime.      levalbuterol (XOPENEX HFA) 45 MCG/ACT inhaler INHALE 2 PUFFS BY MOUTH EVERY 4 HOURS AS NEEDED FOR WHEEZING 45 g 2   magnesium oxide (MAG-OX) 400 (240 Mg) MG tablet TAKE 1 TABLET(400 MG) BY MOUTH THREE TIMES DAILY 270 tablet 1   metoprolol tartrate (LOPRESSOR) 100 MG tablet TAKE 1 TABLET BY MOUTH TWICE  DAILY 180 tablet 3   Multiple Vitamins-Minerals (PRESERVISION AREDS 2) CAPS Take 1 tablet by mouth daily.     nitroGLYCERIN (NITROSTAT) 0.4 MG SL tablet Place 1 tablet (0.4 mg total) under the tongue every 5 (five) minutes as needed for chest pain. 30 tablet 0   omeprazole (PRILOSEC OTC) 20 MG tablet Take 20 mg by mouth daily.     Ranibizumab (LUCENTIS IO) Inject 1 Dose into the eye  every 6 (six) weeks. Bilateral eye by Dr Marvis Repress     tamsulosin (FLOMAX) 0.4 MG CAPS capsule Take 0.4 mg by mouth 2 (two) times daily.     No current facility-administered medications for this visit.     Past Medical History:  Diagnosis Date   Allergy    Anemia    Anticoagulant long-term use    eliquis--- mananged by cardiology   BPH with urinary obstruction    CAD (coronary artery disease)    cardiologist--- dr g. Medardo Hassing--- cath 04-22-2011  moderate LM stenosis, borderline sig pRCA, sig stenosis mid to distal LAD ;  aggressive medical therapy;   cath 07-21-2021 for chest pain w/ ST changes,  severe disease;  s/p CABG x4 07-22-2021   Cataract    Chronic diastolic CHF (congestive heart failure) (HCC) 03/2018   followed by cardiology   CKD (chronic kidney disease), stage III (HCC)    COPD (chronic obstructive pulmonary  disease) (HCC)    followed by pcp   DOE (dyspnea on exertion)    per pt when walk >100yds,  per pt rides outside bike 4-8 miles daily without sob,  household chores and yard work without sob   Eczema    Emphysema of lung (HCC)    GERD (gastroesophageal reflux disease)    Glaucoma, left eye    Heart murmur    Hemorrhoids    History of adenomatous polyp of colon    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 Barron Alvine--- dx by biopsy 01-28-2021   Macular degeneration of both eyes    followed by dr c. Gwendalyn Ege--- bilateral eye injection every 6 wks   Malignant neoplasm prostate [facility]) 07/2021   urologist-- dr Retta Diones;  dx 10/ 2022,  Gleason 5+5, PSA 1.8   Osteoporosis 05/31/2016   PAF (paroxysmal atrial fibrillation) (HCC) 03/2012   cardiologist-- dr g. Christianna Belmonte;  first dx 06/ 2013 AFlutter w/ RVR   Vitamin D deficiency     ROS:   All systems reviewed and negative except as noted in the HPI.   Past Surgical History:  Procedure Laterality Date   ANTERIOR CERVICAL DECOMP/DISCECTOMY FUSION  06/08/2002   @[facility] ;  C5--C7   APPENDECTOMY  1953   CARDIAC CATHETERIZATION  04/22/2011   moderate left main and RCA stenosis not significant by FFR and IVUS on medical therapy   CARDIOVERSION N/A 04/25/2018   Procedure: CARDIOVERSION;  Surgeon: Chilton Si, MD;  Location: [facility] ENDOSCOPY;  Service: Cardiovascular;  Laterality: N/A;   CATARACT  EXTRACTION W/ INTRAOCULAR LENS IMPLANT Bilateral 2021   COLONOSCOPY WITH ESOPHAGOGASTRODUODENOSCOPY (EGD)  01/28/2021   by [email or web address]   CORONARY ARTERY BYPASS GRAFT N/A 07/22/2021   Procedure: 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, ON PUMP, USING LEFT INTERNAL MAMMARY ARTERY AND ENDOSCOPICALLY HARVESTED RIGHT GREATER SAPHENOUS  VEIN;  Surgeon: Loreli Slot, MD;  Location: [facility] OR;  Service: Open Heart Surgery;  Laterality: N/A;   ENDOVEIN HARVEST OF GREATER SAPHENOUS VEIN  07/22/2021   Procedure: ENDOVEIN HARVEST OF GREATER SAPHENOUS VEIN;  Surgeon: Loreli Slot, MD;  Location: [facility] OR;  Service: Open Heart Surgery;;   EXTRACORPOREAL SHOCK WAVE LITHOTRIPSY     x2  1990s   EYE SURGERY     FOOT SURGERY Right 1970   calcification removed from top of foot   GOLD SEED IMPLANT N/A 11/28/2021   Procedure: GOLD SEED IMPLANT;  Surgeon: Jerilee Field, MD;  Location: [facility];  Service: Urology;  Laterality: N/A;   JOINT REPLACEMENT     KNEE ARTHROSCOPY Right 1991   LAPAROSCOPIC CHOLECYSTECTOMY  2000   LEFT HEART CATH AND CORONARY ANGIOGRAPHY N/A 07/21/2021   Procedure: LEFT HEART CATH AND CORONARY ANGIOGRAPHY;  Surgeon: Runell Gess, MD;  Location: [facility] INVASIVE CV LAB;  Service: Cardiovascular;  Laterality: N/A;   SPACE OAR INSTILLATION N/A 11/28/2021   Procedure: SPACE OAR INSTILLATION;  Surgeon: Jerilee Field, MD;  Location: [facility];  Service: Urology;  Laterality: N/A;   TEE WITHOUT CARDIOVERSION N/A 07/22/2021   Procedure: TRANSESOPHAGEAL ECHOCARDIOGRAM (TEE);  Surgeon: Loreli Slot, MD;  Location: [facility] OR;  Service: Open Heart Surgery;  Laterality: N/A;   TOTAL KNEE ARTHROPLASTY Right 08/14/2009   @[facility]    TRANSURETHRAL RESECTION OF PROSTATE N/A 07/17/2021   Procedure: TRANSURETHRAL RESECTION OF THE PROSTATE (TURP);  Surgeon: Marcine Matar, MD;  Location: [facility];  Service: Urology;  Laterality: N/A;   1 HR     Family History  Problem Relation Age of Onset   Heart disease Mother    Diabetes Mother    Cirrhosis Mother    Emphysema Mother        never smoked but 2nd hand through her spouse   Hypertension Mother    Macular degeneration Mother    Heart disease Father    Cancer Father        prostate   Hyperlipidemia Father    Hypertension Father    Varicose Veins Father    Heart attack Father    Peripheral vascular disease Father    Heart disease Sister    Arthritis Sister    Hyperlipidemia Sister    Obesity Sister    Macular degeneration Sister    Heart disease Brother        5 stents   Hyperlipidemia Brother    Macular degeneration Maternal Grandfather    Cirrhosis Sister    Obesity Sister    Arthritis Sister    Heart disease Sister    Obesity Sister    Liver disease Other    Prostate cancer Other    Coronary artery disease Other    Colon cancer Neg Hx    Esophageal cancer Neg Hx    Rectal cancer Neg Hx    Stomach cancer Neg Hx      Social History   Socioeconomic History   Marital status: Married    Spouse name: [name]   Number of children: 0   Years of education: Not on file   Highest education level: Associate degree: academic program  Occupational History   Occupation: retired    Associate Professor: RETIRED  Tobacco Use   Smoking status: Former    Current packs/day: 0.00    Average packs/day: 0.5 packs/day for 50.0 years (25.0 ttl pk-yrs)    Types: Cigarettes  Start date: 05/02/1959    Quit date: 05/01/2009    Years since quitting: 13.9   Smokeless tobacco: Former    Types: Chew    Quit date: 1995   Tobacco comments:    Former smoker 01/27/22  Vaping Use   Vaping status: Never Used  Substance and Sexual Activity   Alcohol use: Yes    Alcohol/week: 2.0 standard drinks of alcohol    Types: 2 Cans of beer per week    Comment: 1 beer daily 2 days a week 01/27/22   Drug use: Never   Sexual activity: Yes    Comment: lives with wife, no dietary  restrictions  Other Topics Concern   Not on file  Social History Narrative   Lives with wife   Social Determinants of Health   Financial Resource Strain: Low Risk  (02/02/2023)   Overall Financial Resource Strain (CARDIA)    Difficulty of Paying Living Expenses: Not hard at all  Food Insecurity: No Food Insecurity (02/02/2023)   Hunger Vital Sign    Worried About Running Out of Food in the Last Year: Never true    Ran Out of Food in the Last Year: Never true  Transportation Needs: No Transportation Needs (02/02/2023)   PRAPARE - Administrator, Civil Service (Medical): No    Lack of Transportation (Non-Medical): No  Physical Activity: Insufficiently Active (02/02/2023)   Exercise Vital Sign    Days of Exercise per Week: 2 days    Minutes of Exercise per Session: 30 min  Stress: No Stress Concern Present (02/02/2023)   [employer] of Occupational Health - Occupational Stress Questionnaire    Feeling of Stress : Only a little  Social Connections: Moderately Integrated (02/02/2023)   Social Connection and Isolation Panel [NHANES]    Frequency of Communication with Friends and Family: Twice a week    Frequency of Social Gatherings with Friends and Family: Twice a week    Attends Religious Services: Never    Database administrator or Organizations: Yes    Attends Banker Meetings: Never    Marital Status: Married  Catering manager Violence: Not At Risk (03/18/2022)   Humiliation, Afraid, Rape, and Kick questionnaire    Fear of Current or Ex-Partner: No    Emotionally Abused: No    Physically Abused: No    Sexually Abused: No     BP (!) 140/86   Pulse 75   Ht 5\' 4"  (1.626 m)   Wt 185 lb (83.9 kg)   SpO2 98%   BMI 31.76 kg/m   Physical Exam:  Well appearing NAD HEENT: Unremarkable Neck:  No JVD, no thyromegally Lymphatics:  No adenopathy Back:  No CVA tenderness Lungs:  Clear HEART:  Regular rate rhythm, no murmurs, no rubs, no clicks Abd:   soft, positive bowel sounds, no organomegally, no rebound, no guarding Ext:  2 plus pulses, no edema, no cyanosis, no clubbing Skin:  No rashes no nodules Neuro:  CN II through XII intact, motor grossly intact  EKG - atrial fib with a controlled VR and RBBB  Assess/Plan:  Atrial fib - his VR is reasonably well controlled. Continue current meds. CAD - he denies anginal symptoms. He still has non-exertional chest pain. He does have sob and with upcoming surgery, I have recommended a lexiscan myoview. He cannot exercise.  HTN - his bp is fairly well controlled. Coags - he will continue systemic anti-coag with eliquis. Preop eval - He is an  acceptable surgical candidate, provided the lexiscan is not high risk. With regard to his systemic anti-coagulation, the patient has normal kidney function. Stopping eliquis will result in normal coag after 2 days. However, he may stop the eliquis for up to 4 days before his surgery. Half life is 8 hours. He can start the eliquis back when he is felt to be a low risk for post op bleeding.    Sharlot Gowda Stewart Pimenta,MD

## 2023-04-19 NOTE — Patient Instructions (Addendum)
Medication Instructions:  No changes *If you need a refill on your cardiac medications before your next appointment, please call your pharmacy*   Lab Work: none If you have labs (blood work) drawn today and your tests are completely normal, you will receive your results only by: MyChart Message (if you have MyChart) OR A paper copy in the mail If you have any lab test that is abnormal or we need to change your treatment, we will call you to review the results.   Testing/Procedures: Your physician has requested that you have a lexiscan myoview. For further information please visit [email or web address] Please follow instruction sheet, as given.   Follow-Up: At [facility], you and your health needs are our priority.  As part of our continuing mission to provide you with exceptional heart care, we have created designated Provider Care Teams.  These Care Teams include your primary Cardiologist (physician) and Advanced Practice Providers (APPs -  Physician Assistants and Nurse Practitioners) who all work together to provide you with the care you need, when you need it.   Your next appointment:   12 month(s)  Provider:   Lewayne Bunting, MD

## 2023-04-19 NOTE — Addendum Note (Signed)
Addended by: Lendon Ka on: 04/19/2023 05:11 PM   Modules accepted: Orders

## 2023-04-22 DIAGNOSIS — H401122 Primary open-angle glaucoma, left eye, moderate stage: Secondary | ICD-10-CM | POA: Diagnosis not present

## 2023-04-22 DIAGNOSIS — Z961 Presence of intraocular lens: Secondary | ICD-10-CM | POA: Diagnosis not present

## 2023-04-22 DIAGNOSIS — H26493 Other secondary cataract, bilateral: Secondary | ICD-10-CM | POA: Diagnosis not present

## 2023-04-22 DIAGNOSIS — H353231 Exudative age-related macular degeneration, bilateral, with active choroidal neovascularization: Secondary | ICD-10-CM | POA: Diagnosis not present

## 2023-04-25 ENCOUNTER — Other Ambulatory Visit: Payer: Self-pay | Admitting: Family Medicine

## 2023-04-27 ENCOUNTER — Telehealth (HOSPITAL_COMMUNITY): Payer: Self-pay | Admitting: *Deleted

## 2023-04-27 NOTE — Telephone Encounter (Signed)
Per DPR left detailed instructions on home answering machine for MPI study.

## 2023-04-29 ENCOUNTER — Ambulatory Visit (HOSPITAL_COMMUNITY): Payer: Medicare Other | Attending: Internal Medicine

## 2023-04-29 DIAGNOSIS — Z01810 Encounter for preprocedural cardiovascular examination: Secondary | ICD-10-CM | POA: Insufficient documentation

## 2023-04-29 DIAGNOSIS — R0602 Shortness of breath: Secondary | ICD-10-CM | POA: Insufficient documentation

## 2023-04-29 DIAGNOSIS — I2510 Atherosclerotic heart disease of native coronary artery without angina pectoris: Secondary | ICD-10-CM | POA: Diagnosis not present

## 2023-04-29 LAB — MYOCARDIAL PERFUSION IMAGING
LV dias vol: 86 mL (ref 62–150)
LV sys vol: 29 mL
Nuc Stress EF: 64 %
Peak HR: 77 {beats}/min
Rest HR: 68 {beats}/min
Rest Nuclear Isotope Dose: 10.7 mCi
SDS: 0
SRS: 0
SSS: 0
ST Depression (mm): 0 mm
Stress Nuclear Isotope Dose: 30.5 mCi
TID: 0.92

## 2023-04-29 MED ORDER — REGADENOSON 0.4 MG/5ML IV SOLN
0.4000 mg | Freq: Once | INTRAVENOUS | Status: AC
  Administered 2023-04-29: 0.4 mg via INTRAVENOUS

## 2023-04-29 MED ORDER — TECHNETIUM TC 99M TETROFOSMIN IV KIT
10.7000 | PACK | Freq: Once | INTRAVENOUS | Status: AC | PRN
  Administered 2023-04-29: 10.7 via INTRAVENOUS

## 2023-04-29 MED ORDER — TECHNETIUM TC 99M TETROFOSMIN IV KIT
30.5000 | PACK | Freq: Once | INTRAVENOUS | Status: AC | PRN
  Administered 2023-04-29: 30.5 via INTRAVENOUS

## 2023-05-05 ENCOUNTER — Encounter (INDEPENDENT_AMBULATORY_CARE_PROVIDER_SITE_OTHER): Payer: Self-pay

## 2023-05-05 NOTE — Patient Instructions (Addendum)
Your procedure is scheduled on:05-17-23 Monday Report to the Registration Desk on the 1st floor of the [facility].Then proceed to the 2nd floor Surgery Desk To find out your arrival time, please call [phone] between 1PM - 3PM on:05-14-23 Friday If your arrival time is 6:00 am, do not arrive before that time as the Medical Mall entrance doors do not open until 6:00 am.  REMEMBER: Instructions that are not followed completely may result in serious medical risk, up to and including death; or upon the discretion of your surgeon and anesthesiologist your surgery may need to be rescheduled.  Do not eat food after midnight the night before surgery.  No gum chewing or hard candies.  You may however, drink CLEAR liquids up to 2 hours before you are scheduled to arrive for your surgery. Do not drink anything within 2 hours of your scheduled arrival time.  Clear liquids include: - water  - apple juice without pulp - gatorade (not RED colors) - black coffee or tea (Do NOT add milk or creamers to the coffee or tea) Do NOT drink anything that is not on this list.  One week prior to surgery:Last dose on 05-09-23  Stop Anti-inflammatories (NSAIDS) such as Advil, Aleve, Ibuprofen, Motrin, Naproxen, Naprosyn and Aspirin based products such as Excedrin, Goody's Powder, BC Powder.You may however, take Tylenol if needed for pain up until the day of surgery. Stop ANY OVER THE COUNTER supplements/vitamins 7 days prior to surgery (Vitamin D3, Magnesium and Preservision AREDS)   Continue taking all prescribed medications with the exception of the following: -Stop your Eliquis 3 days prior to surgery-Last dose will be on 05-13-23 Thursday  TAKE ONLY THESE MEDICATIONS THE MORNING OF SURGERY WITH A SIP OF WATER: -budesonide (ENTOCORT EC)  -metoprolol tartrate (LOPRESSOR)  -tamsulosin (FLOMAX)  -omeprazole (PRILOSEC OTC)-take one the night before and one on the morning of surgery - helps to prevent nausea after  surgery. -You may take diazepam (VALIUM) if needed for anxiety  Use your levalbuterol (XOPENEX HFA) Inhaler the day of surgery and bring your Inhaler to the hospital  Continue your 81 mg Aspirin up until the day prior to surgery-Do NOT take the morning of surgery  No Alcohol for 24 hours before or after surgery.  No Smoking including e-cigarettes for 24 hours before surgery.  No chewable tobacco products for at least 6 hours before surgery.  No nicotine patches on the day of surgery.  Do not use any "recreational" drugs for at least a week (preferably 2 weeks) before your surgery.  Please be advised that the combination of cocaine and anesthesia may have negative outcomes, up to and including death. If you test positive for cocaine, your surgery will be cancelled.  On the morning of surgery brush your teeth with toothpaste and water, you may rinse your mouth with mouthwash if you wish. Do not swallow any toothpaste or mouthwash.  Use CHG Soap as directed on instruction sheet.  Do not wear jewelry, make-up, hairpins, clips or nail polish.  Do not wear lotions, powders, or perfumes.   Do not shave body hair from the neck down 48 hours before surgery.  Contact lenses, hearing aids and dentures may not be worn into surgery.  Do not bring valuables to the hospital. [facility] is not responsible for any missing/lost belongings or valuables.   Notify your doctor if there is any change in your medical condition (cold, fever, infection).  Wear comfortable clothing (specific to your surgery type) to  the hospital.  After surgery, you can help prevent lung complications by doing breathing exercises.  Take deep breaths and cough every 1-2 hours. Your doctor may order a device called an Incentive Spirometer to help you take deep breaths. When coughing or sneezing, hold a pillow firmly against your incision with both hands. This is called "splinting." Doing this helps protect your incision.  It also decreases belly discomfort.  If you are being admitted to the hospital overnight, leave your suitcase in the car. After surgery it may be brought to your room.  In case of increased patient census, it may be necessary for you, the patient, to continue your postoperative care in the Same Day Surgery department.  If you are being discharged the day of surgery, you will not be allowed to drive home. You will need a responsible individual to drive you home and stay with you for 24 hours after surgery.   If you are taking public transportation, you will need to have a responsible individual with you.  Please call the Pre-admissions Testing Dept. at [phone] if you have any questions about these instructions.  Surgery Visitation Policy:  Patients having surgery or a procedure may have two visitors.  Children under the age of 6 must have an adult with them who is not the patient.  Inpatient Visitation:    Visiting hours are 7 a.m. to 8 p.m. Up to four visitors are allowed at one time in a patient room. The visitors may rotate out with other people during the day.  One visitor age 26 or older may stay with the patient overnight and must be in the room by 8 p.m.     Pre-operative 5 CHG Bath Instructions   You can play a key role in reducing the risk of infection after surgery. Your skin needs to be as free of germs as possible. You can reduce the number of germs on your skin by washing with CHG (chlorhexidine gluconate) soap before surgery. CHG is an antiseptic soap that kills germs and continues to kill germs even after washing.   DO NOT use if you have an allergy to chlorhexidine/CHG or antibacterial soaps. If your skin becomes reddened or irritated, stop using the CHG and notify one of our RNs at [phone].   Please shower with the CHG soap starting 4 days before surgery using the following schedule:     Please keep in mind the following:  DO NOT shave, including  legs and underarms, starting the day of your first shower.   You may shave your face at any point before/day of surgery.  Place clean sheets on your bed the day you start using CHG soap. Use a clean washcloth (not used since being washed) for each shower. DO NOT sleep with pets once you start using the CHG.   CHG Shower Instructions:  If you choose to wash your hair and private area, wash first with your normal shampoo/soap.  After you use shampoo/soap, rinse your hair and body thoroughly to remove shampoo/soap residue.  Turn the water OFF and apply about 3 tablespoons (45 ml) of CHG soap to a CLEAN washcloth.  Apply CHG soap ONLY FROM YOUR NECK DOWN TO YOUR TOES (washing for 3-5 minutes)  DO NOT use CHG soap on face, private areas, open wounds, or sores.  Pay special attention to the area where your surgery is being performed.  If you are having back surgery, having someone wash your back for you may be  helpful. Wait 2 minutes after CHG soap is applied, then you may rinse off the CHG soap.  Pat dry with a clean towel  Put on clean clothes/pajamas   If you choose to wear lotion, please use ONLY the CHG-compatible lotions on the back of this paper.     Additional instructions for the day of surgery: DO NOT APPLY any lotions, deodorants, cologne, or perfumes.   Put on clean/comfortable clothes.  Brush your teeth.  Ask your nurse before applying any prescription medications to the skin.      CHG Compatible Lotions   Aveeno Moisturizing lotion  Cetaphil Moisturizing Cream  Cetaphil Moisturizing Lotion  Clairol Herbal Essence Moisturizing Lotion, Dry Skin  Clairol Herbal Essence Moisturizing Lotion, Extra Dry Skin  Clairol Herbal Essence Moisturizing Lotion, Normal Skin  Curel Age Defying Therapeutic Moisturizing Lotion with Alpha Hydroxy  Curel Extreme Care Body Lotion  Curel Soothing Hands Moisturizing Hand Lotion  Curel Therapeutic Moisturizing Cream, Fragrance-Free  Curel  Therapeutic Moisturizing Lotion, Fragrance-Free  Curel Therapeutic Moisturizing Lotion, Original Formula  Eucerin Daily Replenishing Lotion  Eucerin Dry Skin Therapy Plus Alpha Hydroxy Crme  Eucerin Dry Skin Therapy Plus Alpha Hydroxy Lotion  Eucerin Original Crme  Eucerin Original Lotion  Eucerin Plus Crme Eucerin Plus Lotion  Eucerin TriLipid Replenishing Lotion  Keri Anti-Bacterial Hand Lotion  Keri Deep Conditioning Original Lotion Dry Skin Formula Softly Scented  Keri Deep Conditioning Original Lotion, Fragrance Free Sensitive Skin Formula  Keri Lotion Fast Absorbing Fragrance Free Sensitive Skin Formula  Keri Lotion Fast Absorbing Softly Scented Dry Skin Formula  Keri Original Lotion  Keri Skin Renewal Lotion Keri Silky Smooth Lotion  Keri Silky Smooth Sensitive Skin Lotion  Nivea Body Creamy Conditioning Oil  Nivea Body Extra Enriched Teacher, adult education Moisturizing Lotion Nivea Crme  Nivea Skin Firming Lotion  NutraDerm 30 Skin Lotion  NutraDerm Skin Lotion  NutraDerm Therapeutic Skin Cream  NutraDerm Therapeutic Skin Lotion  ProShield Protective Hand Cream  Provon moisturizing lotion

## 2023-05-06 ENCOUNTER — Encounter
Admission: RE | Admit: 2023-05-06 | Discharge: 2023-05-06 | Disposition: A | Discharge status: Home / Self Care | Payer: Medicare Other | Source: Ambulatory Visit | Attending: Neurosurgery | Admitting: Neurosurgery

## 2023-05-06 DIAGNOSIS — I5030 Unspecified diastolic (congestive) heart failure: Secondary | ICD-10-CM | POA: Diagnosis not present

## 2023-05-06 DIAGNOSIS — Z01812 Encounter for preprocedural laboratory examination: Secondary | ICD-10-CM | POA: Insufficient documentation

## 2023-05-06 DIAGNOSIS — I2510 Atherosclerotic heart disease of native coronary artery without angina pectoris: Secondary | ICD-10-CM | POA: Diagnosis not present

## 2023-05-06 DIAGNOSIS — Z79899 Other long term (current) drug therapy: Secondary | ICD-10-CM | POA: Insufficient documentation

## 2023-05-06 DIAGNOSIS — E875 Hyperkalemia: Secondary | ICD-10-CM

## 2023-05-06 DIAGNOSIS — E785 Hyperlipidemia, unspecified: Secondary | ICD-10-CM | POA: Insufficient documentation

## 2023-05-06 HISTORY — DX: Personal history of other diseases of the digestive system: Z87.19

## 2023-05-06 HISTORY — DX: Family history of other specified conditions: Z84.89

## 2023-05-06 HISTORY — DX: Unqualified visual loss, left eye, normal vision right eye: H54.62

## 2023-05-06 LAB — BASIC METABOLIC PANEL
Anion gap: 10 (ref 5–15)
BUN: 27 mg/dL — ABNORMAL HIGH (ref 8–23)
CO2: 27 mmol/L (ref 22–32)
Calcium: 9.9 mg/dL (ref 8.9–10.3)
Chloride: 101 mmol/L (ref 98–111)
Creatinine, Ser: 0.89 mg/dL (ref 0.61–1.24)
GFR, Estimated: >60 mL/min (ref >60)
Glucose, Bld: 93 mg/dL (ref 70–99)
Potassium: 5.2 mmol/L — ABNORMAL HIGH (ref 3.5–5.1)
Sodium: 138 mmol/L (ref 135–145)

## 2023-05-06 LAB — TYPE AND SCREEN
ABO/RH(D): O POS
Antibody Screen: POSITIVE

## 2023-05-06 LAB — URINALYSIS, ROUTINE W REFLEX MICROSCOPIC
Bilirubin Urine: NEGATIVE
Glucose, UA: NEGATIVE mg/dL
Hgb urine dipstick: NEGATIVE
Ketones, ur: NEGATIVE mg/dL
Leukocytes,Ua: NEGATIVE
Nitrite: NEGATIVE
Protein, ur: NEGATIVE mg/dL
Specific Gravity, Urine: 1.012 (ref 1.005–1.030)
pH: 6 (ref 5.0–8.0)

## 2023-05-06 LAB — SURGICAL PCR SCREEN
MRSA, PCR: NEGATIVE
Staphylococcus aureus: NEGATIVE

## 2023-05-06 LAB — MAGNESIUM: Magnesium: 1.8 mg/dL (ref 1.7–2.4)

## 2023-05-06 NOTE — Progress Notes (Signed)
  [facility] Perioperative Services: Pre-Admission/Anesthesia Testing  Abnormal Lab Notification   Date: 05/06/23  Name: Ryan [name] MRN:   [id number]  Re: Abnormal labs noted during PAT appointment   Notified:    Provider Name Provider Role Notification Mode  Venetia Night, MD Neurosurgery Routed and/or faxed via [facility]   ABNORMAL LAB VALUE(S):   Lab Results  Component Value Date   K 5.2 (H) 05/06/2023   Clinical Information and Notes:  Ryan [name] is scheduled for a L4-5 DECOMPRESSION on 05/17/2023. Preoperative labs revealed a mild HYPERkalemia (K+ 5.2 mmol/L). Renal function is normal; creatinine 0.89 mg/dL. Medication list reviewed. There is nothing on the list that would overtly implicated in this being drug mediated derangement. Added a Mg level, as patient is on a rather large dose of supplemental magnesium. Results of Mg testing WNL at 1.8 mg/dL.   Sending result to patient's primary attending surgeon for review. Order entered to have K+ level rechecked on the day of surgery to ensure that he is safe to proceed with the planned surgical intervention.   Quentin Mulling, MSN, APRN, FNP-C, CEN [facility]  Peri-operative Services Nurse Practitioner Phone: [phone] Fax: [phone] 05/06/23 12:51 PM

## 2023-05-07 ENCOUNTER — Other Ambulatory Visit (HOSPITAL_BASED_OUTPATIENT_CLINIC_OR_DEPARTMENT_OTHER): Payer: Self-pay

## 2023-05-11 ENCOUNTER — Telehealth: Payer: Self-pay | Admitting: Orthopedic Surgery

## 2023-05-11 ENCOUNTER — Encounter: Payer: Self-pay | Admitting: Neurosurgery

## 2023-05-11 NOTE — Progress Notes (Signed)
Perioperative / Anesthesia Services  Pre-Admission Testing Clinical Review / Preoperative Anesthesia Consult  Date: 05/11/23  Patient Demographics:  Name: Ryan [name] DOB:   1946-04-12 MRN:   [id number]  Planned Surgical Procedure(s):    Case: [id number] Date/Time: 05/17/23 0925   Procedure: L4-5 DECOMPRESSION   Anesthesia type: General   Pre-op diagnosis: M48.062 lumbar stenosis with neurogenic claudication   Location: [facility] OR ROOM 03 / [facility]   Surgeons: Venetia Night, MD     NOTE: Available PAT nursing documentation and vital signs have been reviewed. Clinical nursing staff has updated patient's PMH/PSHx, current medication list, and drug allergies/intolerances to ensure comprehensive history available to assist in medical decision making as it pertains to the aforementioned surgical procedure and anticipated anesthetic course. Extensive review of available clinical information personally performed. Ryan [name] PMH and PSHx updated with any diagnoses/procedures that  may have been inadvertently omitted during his intake with the pre-admission testing department's nursing staff.  Clinical Discussion:  Ryan [name] is a 77 y.o. male who is submitted for pre-surgical anesthesia review and clearance prior to him undergoing the above procedure. Patient is a Former Smoker (25 pack years; quit 04/2009). Pertinent PMH includes: CAD (s/p CABG), atrial fibrillation, CHF, TIA, moderate small vessel cerebrovascular disease, NSVT, BILATERAL carotid artery disease, aortic atherosclerosis, cardiac murmur, RBBB, HTN, HLD, CKD-III, COPD, DOE, GERD (on daily PPI), hiatal hernia, nephrolithiasis, glaucoma, prostate cancer (s/p TURP + XRT + LT-ADT), anemia, cervical DDD (s/p ACDF C5-C7), lumbar stenosis with associated neurogenic claudication, SNHL, anxiety (on BZO PRN).   Patient is followed by cardiology Ladona Ridgel,  MD). He was last seen in the cardiology clinic on 04/19/2023;  notes reviewed. At the time of his clinic visit, patient complaining of worsening shortness of breath and pain in his lower extremities.  He denied any chest pain PND, orthopnea, palpitations, significant peripheral edema, weakness, fatigue, vertiginous symptoms, or presyncope/syncope. Patient with a past medical history significant for cardiovascular diagnoses. Documented physical exam was grossly benign, providing no evidence of acute exacerbation and/or decompensation of the patient's known cardiovascular conditions.  Coronary CTA was performed on 03/26/2011 revealing elevated coronary calcium score 505.  Coronary calcium was concentrated in the proximal RCA and LM territories.  Diagnostic LEFT heart catheterization on 04/01/2011 revealing multivessel CAD; 40-50% ostial LM, 60-70% mid to distal LAD, 30% mid D2, and 60% proximal RCA.  Interventional cardiology made the decision to defer intervention opting for aggressive medical management.  Patient presented with neurological symptoms concerning for acute stroke on 03/23/2021.  Patient experiencing new onset weakness in his LEFT lower extremity.    CT imaging of the head revealed no acute intracranial abnormalities.  Follow-up MRI imaging of the brain without contrast demonstrated moderate/severe stenosis within the V4 RIGHT vertebral artery proximal to the RIGHT PICA origin.  There was moderate cerebral small vessel ischemic disease noted.  Patient was seen in consult by neurology and CVA was ruled out.  Symptoms and imaging consistent with TIA.  Patient has no residual deficits following neurological event.  BILATERAL carotid artery Doppler study performed on 03/24/2021 revealing 1-39% stenosis of the BILATERAL internal carotid arteries.  Vertebral arteries demonstrate antegrade flow.  There were normal flow hemodynamics in the BILATERAL subclavians.  Diagnostic LEFT heart catheterization on 07/21/2021 revealing multivessel CAD; 90% ostial LM, 60%  ostial-proximal LCx, 95% proximal RCA, 70% distal RCA, and 70% RPDA.  Given the degree and complexity of patient's coronary artery disease, patient was referred to CVTS for  consideration of coronary revascularization.  Patient underwent four-vessel revascularization on 07/22/2021.  LIMA-LAD, SVG-OM1, and sequential SVG-acute marginal-PDA bypass grafts were placed.  Procedure was complicated by the development of postop ileus.  Most recent TTE was performed on 01/13/2022 revealed normal left ventricular systolic function with an EF of 55 to 60%.  There were no regional wall motion abnormalities.  Diastolic Doppler parameters indeterminant.  Right ventricular size and function normal.  There was severe biatrial enlargement.  Mild mitral valve regurgitation noted.  Mild aortic valve sclerosis present.  All transvalvular gradients were noted to be normal providing no evidence suggestive of valvular stenosis.  Aorta normal in size with no evidence of aneurysmal dilatation.  Long-term cardiac event monitor study performed on 03/17/2022 revealing a predominant underlying atrial fibrillation/flutter with occasional PVCs and a bigeminy pattern with heart rates ranging from 52-168 bpm (average 89 bpm). There were no sustained pauses.  3 runs of PSVT noted with the fastest interval lasting 10 beats at a maximum rate of 190 bpm and the longest lasting 16 beats at an average rate of 121 bpm.  There was 100% atrial fibrillation/flutter burden.  Patient with an atrial fibrillation diagnosis; CHA2DS2-VASc Score = 7 (age x 2, CHF, HTN, TIA x 2, vascular disease history).patient underwent DCCV procedure on 04/25/2018, at which time he received a single 150 J synchronized cardioversion converting his atrial fibrillation to sinus bradycardia.  Cardiac rate and rhythm currently being maintained on metoprolol tartrate.  Patient is chronically anticoagulated using a DOAC medication (apixaban).  Patient is reportedly compliant with  therapy with no evidence or reports of GI bleeding. Blood pressure reasonably controlled at 140/86 mmHg on currently prescribed diuretic (furosemide) and beta-blocker (metoprolol tartrate) therapies.  Patient is on PCSK9i (evolocumab) therapy for his HLD diagnosis and ASCVD prevention.  He is intolerant of statins.  Patient has a supply of short acting nitrates (NTG) to use on a as needed basis for recurrent anginal symptoms; denied recent use.  Patient is not diabetic.  He does not have an OSAH diagnosis.  Functional capacity is somewhat limited by patient's age and multiple medical comorbidities, including worsening shortness of breath. Patient also is limited by lower back pain and associated neurogenic claudication.  With that said, patient is able to complete all of his ADL/IADLs without cardiovascular limitation.  Per the DASI, patient questionably able to achieve 4 METS of physical activity without experiencing, at least to some degree, significant angina/anginal equivalent symptoms.  No changes were made to his medication regimen.  In light of patient's upcoming surgery, given his worsening shortness of breath, the decision was made to further evaluate patient with noninvasive cardiac imaging.  Patient to follow-up with outpatient cardiology in 1 year or sooner if needed.  Since patient was last seen by his cardiologist.  He has undergone the recommended noninvasive cardiovascular testing.  Myocardial perfusion imaging study was performed on 04/29/2023 revealing a normal left ventricular systolic function with an EF of 64%.  There was no evidence of stress-induced myocardial ischemia or arrhythmia; no sonographic evidence of scar.  Study determined be normal and low risk.  Ryan [name] is scheduled for L4-5 DECOMPRESSION on 05/17/2023 with Dr. Venetia Night, MD.  Given patient's past medical history significant for cardiovascular diagnoses, presurgical cardiac clearance was sought by the PAT  team. Per cardiology, "based ACC/AHA guidelines and the patient's past medical history this patient would be at an overall ACCEPTABLE risk for the planned procedure without further cardiovascular testing or intervention at  this time".    Again, this patient is on daily oral anticoagulation therapy using a DOAC medication.  He has been instructed on recommendations for holding his apixaban for 3 days prior to his procedure with plans to restart as soon as postoperative bleeding risk felt to be minimized by his attending surgeon. The patient has been instructed that his last dose of his apixaban should be on 05/13/2023.  Given patient's extensive cardiovascular history, patient will continue his daily low-dose ASA throughout his perioperative course.  Patient denies previous perioperative complications with anesthesia in the past.  Patient does have a family history of (+) PONV and  (+) postoperative delirium manifested as agitation in first-degree relatives (sisters) in review of the available records, it is noted that patient underwent a MAC anesthetic course at [facility] ([facility]) in 11/2021 without documented complications.      05/06/2023   10:10 AM 05/06/2023   10:00 AM 04/29/2023   12:39 PM  Vitals with BMI  Height  5\' 4"  5\' 4"   Weight  186 lbs 15 oz 185 lbs  BMI  32.07 31.74  Systolic 156    Diastolic 81    Pulse 75      Providers/Specialists:   NOTE: Primary physician provider listed below. Patient may have been seen by APP or partner within same practice.   PROVIDER ROLE / SPECIALTY LAST Ryan Furlong, MD Neurosurgery (Surgeon) 04/01/2023  Ryan Canary, MD Primary Care Provider 02/08/2023  Ryan Bunting, MD Cardiology 04/19/2023   Allergies:  Albumin (human), Polymyxin b-trimethoprim, Pseudoephedrine, Codeine, Guaiacol, Statins, Brimonidine tartrate, Gabapentin, Meloxicam, Peppermint flavor, Pseudoephedrine-guaifenesin, Rosuvastatin calcium, Tapentadol,  Ciprofloxacin, Moxifloxacin, and Rofecoxib  Current Home Medications:   No current facility-administered medications for this encounter.    acetaminophen (TYLENOL) 500 MG tablet   aspirin EC 81 MG EC tablet   budesonide (ENTOCORT EC) 3 MG 24 hr capsule   cholecalciferol (VITAMIN D3) 25 MCG (1000 UNIT) tablet   diazepam (VALIUM) 5 MG tablet   ELIQUIS 5 MG TABS tablet   Evolocumab (REPATHA SURECLICK) 140 MG/ML SOAJ   ferrous sulfate 325 (65 FE) MG tablet   furosemide (LASIX) 20 MG tablet   latanoprost (XALATAN) 0.005 % ophthalmic solution   levalbuterol (XOPENEX HFA) 45 MCG/ACT inhaler   loperamide (IMODIUM) 2 MG capsule   magnesium oxide (MAG-OX) 400 (240 Mg) MG tablet   metoprolol tartrate (LOPRESSOR) 100 MG tablet   Multiple Vitamins-Minerals (PRESERVISION AREDS 2) CAPS   nitroGLYCERIN (NITROSTAT) 0.4 MG SL tablet   omeprazole (PRILOSEC OTC) 20 MG tablet   Ranibizumab (LUCENTIS IO)   tamsulosin (FLOMAX) 0.4 MG CAPS capsule   History:   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; Gleason 5+5; PSA 1.8 --> Tx'd with XRT (12/10/21 - 02/18/22) + LT-ADT; XRT course truncated due to severe radiation colitis   Allergy    Anemia    Anxiety    a.) on BZO PRN (diazepam)   Aortic atherosclerosis (HCC)    Bilateral carotid artery disease (HCC) 03/24/2021   a.) carotid doppler 03/24/2021: 1-39% BICA   BPH with urinary obstruction    CAD (coronary artery disease) 03/26/2011   a.) cCTA 03/26/2011: Ca2+ 505 (pRCA + LM); b.) LHC 04/01/2011: 40-50% oLM, 60-70% m-dLAD, 30% mD2, 60% pRCA - med mgmt; c.) LHC 10/17/2022L 90% oLM, 60% o-pLCx, 95%  pRCA, 70% dRCA, 70% RPDA --> CVTS consult; d.) s/p 4v CABG 07/22/2021   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   Chronic diastolic CHF (congestive heart failure) (HCC) 01/14/2017   a.) TTE  01/14/2017: TTE 01/14/2017: EF 55-60%, sev LA dil, triv MR, G1DD; b.) TTE 03/23/2018: EF 55-60%, mod LVH, sev LA dil, mod-sev RA dil; c.) TTE 04/17/2020: EF 60-65%, mod asym ant seg LVH, mild LA dil, mild MR, G1DD; d.) TTE 03/25/2021: EF 50-55%, AoV sclerosis, triv MR   CKD (chronic kidney disease), stage III (HCC)    COPD (chronic obstructive pulmonary disease) (HCC)    DDD (degenerative disc disease), cervical    a.) s/p ACDF C5-C7 06/08/2002   DOE (dyspnea on exertion)    Eczema    Emphysema of lung (HCC)    Family history of adverse reaction to anesthesia    a.) PONV and postoperative delirium/agitation in 1st degree relatives (sisters)   GERD (gastroesophageal reflux disease)    Glaucoma, left eye    Heart murmur    Hemorrhoids    History of adenomatous polyp of colon    History of bilateral cataract extraction 2021   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 Barron Alvine--- dx by biopsy 01-28-2021   Macular degeneration of both eyes    followed by dr c. Gwendalyn Ege--- bilateral eye injection every 6 wks   NSVT (nonsustained ventricular tachycardia) (HCC) 03/17/2022   a.) Zio patch 03/17/2022: 3 runs with fastest lasting 10 beats at rate of 190 bpm and longest lasting 16 beats at rate of 121 bpm   On apixaban therapy    Osteoporosis 05/31/2016   PAF (paroxysmal atrial fibrillation) (HCC) 03/2012   a.)  CHA2DS2VASc = 7 (age x2, CHF, HTN, TIA x2, vascular disease history);  b.) s/p DCCV 04/25/2018 (150J x 1 --> SB); c.) rate/rhythm maintained on oral metoprolol tartrate; chronically anticoagulated with apixaban   Postoperative ileus (HCC) 07/2021   a.) following CABG procedure   RBBB (right bundle branch block)    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   a.) LIMA-LAD, SVG-OM1, sequential SVG-acute marginal-PDA   SNHL (sensorineural hearing loss)    Statin intolerance    Vision loss of left eye    macular degeneration   Vitamin D deficiency    Past Surgical History:  Procedure Laterality Date   ANTERIOR CERVICAL DECOMP/DISCECTOMY FUSION  06/08/2002   APPENDECTOMY  1953   CARDIAC CATHETERIZATION Left 04/01/2011   CARDIOVERSION N/A 04/25/2018   Procedure: CARDIOVERSION;  Surgeon: Ryan Si, MD;  Location: [facility] ENDOSCOPY;  Service: Cardiovascular;  Laterality: N/A;   CATARACT EXTRACTION W/ INTRAOCULAR LENS IMPLANT Bilateral 2021   COLONOSCOPY WITH ESOPHAGOGASTRODUODENOSCOPY (EGD)  01/28/2021   by RUEAVW   CORONARY ARTERY BYPASS GRAFT N/A 07/22/2021   Procedure: CORONARY ARTERY BYPASS  GRAFTING (CABG) TIMES 4, ON PUMP, USING LEFT INTERNAL MAMMARY ARTERY AND ENDOSCOPICALLY HARVESTED RIGHT GREATER SAPHENOUS VEIN;  Surgeon: Loreli Slot, MD;  Location: [facility] OR;  Service: Open Heart Surgery;  Laterality: N/A;   ENDOVEIN HARVEST OF GREATER SAPHENOUS VEIN  07/22/2021   Procedure: ENDOVEIN HARVEST OF GREATER SAPHENOUS VEIN;  Surgeon: Loreli Slot, MD;  Location: [facility] OR;  Service: Open Heart Surgery;;   EXTRACORPOREAL SHOCK WAVE LITHOTRIPSY     x2  1990s   EYE SURGERY     FOOT SURGERY Right 1970   calcification removed from top of foot   GOLD SEED IMPLANT N/A 11/28/2021   Procedure: GOLD SEED IMPLANT;  Surgeon: Jerilee Field, MD;  Location: [facility];  Service: Urology;  Laterality: N/A;   KNEE ARTHROSCOPY Right 1991   LAPAROSCOPIC CHOLECYSTECTOMY  2000   LEFT HEART  CATH AND CORONARY ANGIOGRAPHY N/A 07/21/2021   Procedure: LEFT HEART CATH AND CORONARY ANGIOGRAPHY;  Surgeon: Runell Gess, MD;  Location: [facility] INVASIVE CV LAB;  Service: Cardiovascular;  Laterality: N/A;   SPACE OAR INSTILLATION N/A 11/28/2021   Procedure: SPACE OAR INSTILLATION;  Surgeon: Jerilee Field, MD;  Location: [facility];  Service: Urology;  Laterality: N/A;   TEE WITHOUT CARDIOVERSION N/A 07/22/2021   Procedure: TRANSESOPHAGEAL ECHOCARDIOGRAM (TEE);  Surgeon: Loreli Slot, MD;  Location: [facility] OR;  Service: Open Heart Surgery;  Laterality: N/A;   TOTAL KNEE ARTHROPLASTY Right 08/14/2009   @[facility]    TRANSURETHRAL RESECTION OF PROSTATE N/A 07/17/2021   Procedure: TRANSURETHRAL RESECTION OF THE PROSTATE (TURP);  Surgeon: Marcine Matar, MD;  Location: [facility];  Service: Urology;  Laterality: N/A;  1 HR   Family History  Problem Relation Age of Onset   Heart disease Mother    Diabetes Mother    Cirrhosis Mother    Emphysema Mother        never smoked but 2nd hand through her spouse   Hypertension Mother    Macular degeneration Mother    Heart disease Father    Cancer Father        prostate   Hyperlipidemia Father    Hypertension Father    Varicose Veins Father    Heart attack Father    Peripheral vascular disease Father    Heart disease Sister    Arthritis Sister    Hyperlipidemia Sister    Obesity Sister    Macular degeneration Sister    Heart disease Brother        5 stents   Hyperlipidemia Brother    Macular degeneration Maternal Grandfather    Cirrhosis Sister    Obesity Sister    Arthritis Sister    Heart disease Sister    Obesity Sister    Liver disease Other    Prostate cancer Other    Coronary artery disease Other    Colon cancer Neg Hx    Esophageal cancer Neg Hx    Rectal cancer Neg Hx    Stomach cancer Neg Hx    Social History   Tobacco Use   Smoking status: Former    Current packs/day: 0.00     Average packs/day: 0.5 packs/day for 50.0 years (25.0 ttl pk-yrs)    Types: Cigarettes    Start date: 05/02/1959    Quit date: 05/01/2009    Years since quitting: 14.0   Smokeless tobacco: Former    Types: Chew    Quit date: 1995   Tobacco comments:  Former smoker 01/27/22  Vaping Use   Vaping status: Never Used  Substance Use Topics   Alcohol use: Yes    Alcohol/week: 2.0 standard drinks of alcohol    Types: 2 Cans of beer per week    Comment: 1-2 glasses wine/beer daily   Drug use: Never    Pertinent Clinical Results:  LABS:  Lab Results  Component Value Date   WBC 7.0 02/08/2023   HGB 12.7 (L) 02/08/2023   HCT 36.9 (L) 02/08/2023   MCV 87.8 02/08/2023   PLT 177.0 02/08/2023   Lab Results  Component Value Date   NA 138 05/06/2023   K 5.2 (H) 05/06/2023   CO2 27 05/06/2023   GLUCOSE 93 05/06/2023   BUN 27 (H) 05/06/2023   CREATININE 0.89 05/06/2023   CALCIUM 9.9 05/06/2023   GFR 83.50 02/08/2023   EGFR 57 (L) 08/12/2021   GFRNONAA >60 05/06/2023    ABO/RH(D) 05/06/2023 O POS   Final   Antibody Screen 05/06/2023 POS   Final   Sample Expiration 05/06/2023 05/20/2023,2359   Final   Extend sample reason 05/06/2023 NO TRANSFUSIONS OR PREGNANCY IN THE PAST 3 MONTHS   Final   Antibody Identification 05/06/2023    Final                   Value:NON SPECIFIC ANTIBODY REACTIVITY Performed at [facility], [address] [place], [place] [address]    Color, Urine 05/06/2023 YELLOW (A)  YELLOW Final   APPearance 05/06/2023 CLEAR (A)  CLEAR Final   Specific Gravity, Urine 05/06/2023 1.012  1.005 - 1.030 Final   pH 05/06/2023 6.0  5.0 - 8.0 Final   Glucose, UA 05/06/2023 NEGATIVE  NEGATIVE mg/dL Final   Hgb urine dipstick 05/06/2023 NEGATIVE  NEGATIVE Final   Bilirubin Urine 05/06/2023 NEGATIVE  NEGATIVE Final   Ketones, ur 05/06/2023 NEGATIVE  NEGATIVE mg/dL Final   Protein, ur 60/45/4098 NEGATIVE  NEGATIVE mg/dL Final   Nitrite 11/91/4782 NEGATIVE  NEGATIVE  Final   Leukocytes,Ua 05/06/2023 NEGATIVE  NEGATIVE Final   Performed at [facility], [address] [place], [place] [address]   MRSA, PCR 05/06/2023 NEGATIVE  NEGATIVE Final   Staphylococcus aureus 05/06/2023 NEGATIVE  NEGATIVE Final   Comment: (NOTE) The Xpert SA Assay (FDA approved for NASAL specimens in patients 31 years of age and older), is one component of a comprehensive surveillance program. It is not intended to diagnose infection nor to guide or monitor treatment. Performed at [facility], [address] [place], [place] [address]     ECG: Date: 04/19/2023 Time ECG obtained: 1019 AM Rate: 65 bpm Rhythm: atrial fibrillation; RBBB Axis (leads I and aVF): Left axis deviation Intervals: QRS 116 ms. QTc 434 ms. ST segment and T wave changes: No evidence of acute ST segment elevation or depression. Evidence of a possible age undetermined inferior and anterior infarcts present. Comparison: Similar to previous tracing obtained on 10/02/2022   IMAGING / PROCEDURES: MYOCARDIAL PERFUSION IMAGING STUDY (LEXISCAN) performed on 04/29/2023 Normal left ventricular systolic function with a normal LVEF of 64% Normal myocardial thickening and wall motion Left ventricular cavity size normal SPECT images demonstrate homogenous tracer distribution throughout the myocardium No evidence of stress-induced myocardial ischemia or arrhythmia Normal low risk study  MR LUMBAR SPINE WO CONTRAST performed on 02/28/2023 L4-5: Moderate disc bulge. Mild facet and ligamentous hypertrophy. Mild multifactorial stenosis with potential for neural compression in either or both lateral recesses. This has worsened slightly since  2020. L3-4: Minimal disc bulge. Mild facet and ligamentous hypertrophy. No compressive stenosis. L5-S1: Mild disc bulge. No compressive stenosis. Post radiation fatty change of the marrow. No sign of regional osseous metastatic disease.  LONG TERM CARDIAC  EVENT MONITOR STUDY performed on  03/17/2022 Patch Wear Time:  2 days and 23 hours (2023-06-04T08:15:07-399 to 2023-06-07T07:48:22-0400) 3 Ventricular Tachycardia runs occurred, the run with the fastest interval lasting 10 beats with a max rate of 190 bpm, the longest lasting 16 beats with an avg rate of 121 bpm.  Atrial Fibrillation/Flutter occurred continuously (100% burden), ranging from 52-168 bpm (avg of 89 bpm).  Bundle Branch Block/IVCD was present.  Isolated VEs were occasional (1.4%, 5389), VE Couplets were rare (<1.0%, 116), and VE Triplets were rare (<1.0%, 1). Ventricular Bigeminy and Trigeminy were present.  CT ANGIO CHEST PE W/CM &/OR WO CM performed on 01/17/2022 No evidence of pulmonary embolism. Examination of the segmental and subsegmental arteries is limited due to respiratory motion artifact. Hazy ground-glass opacities in the lungs bilaterally, possible edema or infiltrate. Small to moderate bilateral pleural effusions. Emphysema. Aortic atherosclerosis  Coronary artery calcifications.   TRANSTHORACIC ECHOCARDIOGRAM performed on 01/13/2022 Left ventricular ejection fraction, by estimation, is 55 to 60%. The left ventricle has normal function. The left ventricle has no regional wall motion abnormalities. Left ventricular diastolic function could not be evaluated.  Right ventricular systolic function is normal. The right ventricular size is normal.  Left atrial size was severely dilated.  Right atrial size was severely dilated.  The mitral valve is normal in structure. Mild mitral valve regurgitation. No evidence of mitral stenosis.  The aortic valve is normal in structure. Aortic valve regurgitation is not visualized. Aortic valve sclerosis is present, with no evidence of aortic valve stenosis.  The inferior vena cava is normal in size with greater than 50% respiratory variability, suggesting right atrial pressure of 3 mmHg.   CORONARY ARTERY BYPASS GRAFTING performed on   07/22/2021 4 vessel CABG LIMA-LAD SVG-OM1 Sequential SVG-acute marginal-PDA  LEFT HEART CATHETERIZATION AND CORONARY ANGIOGRAPHY performed on 07/21/2021 Normal left ventricular systolic function LVEDP = 18 mmHg Multivessel CAD 90% ostial LM 60% ostial to proximal LCx 95% proximal RCA 70% distal RCA 70% RPDA Recommendations Refer to CVTS for consultation regarding coronary revascularization   Impression and Plan:  Ryan [name] has been referred for pre-anesthesia review and clearance prior to him undergoing the planned anesthetic and procedural courses. Available labs, pertinent testing, and imaging results were personally reviewed by me in preparation for upcoming operative/procedural course. [facility] medical record has been updated following extensive record review and patient interview with PAT staff.   This patient has been appropriately cleared by cardiology with an overall ACCEPTABLE risk of experiencing significant perioperative cardiovascular complications. Based on clinical review performed today (05/11/23), barring any significant acute changes in the patient's overall condition, it is anticipated that he will be able to proceed with the planned surgical intervention. Any acute changes in clinical condition may necessitate his procedure being postponed and/or cancelled. Patient will meet with anesthesia team (MD and/or CRNA) on the day of his procedure for preoperative evaluation/assessment. Questions regarding anesthetic course will be fielded at that time.   Pre-surgical instructions were reviewed with the patient during his PAT appointment, and questions were fielded to satisfaction by PAT clinical staff. He has been instructed on which medications that he will need to hold prior to surgery, as well as the ones that have been deemed safe/appropriate to take on the  day of his procedure. As part of the general education provided by PAT, patient made aware both verbally and in  writing, that he would need to abstain from the use of any illegal substances during his perioperative course.  He was advised that failure to follow the provided instructions could necessitate case cancellation or result in serious perioperative complications up to and including death. Patient encouraged to contact PAT and/or his surgeon's office to discuss any questions or concerns that may arise prior to surgery; verbalized understanding.   Quentin Mulling, MSN, APRN, FNP-C, CEN [facility]  Peri-operative Services Nurse Practitioner Phone: [phone] Fax: [phone] 05/11/23 12:32 PM  NOTE: This note has been prepared using Dragon dictation software. Despite my best ability to proofread, there is always the potential that unintentional transcriptional errors may still occur from this process.

## 2023-05-11 NOTE — Telephone Encounter (Signed)
Pt faxed this to [place] stating that this was something you had asked about. Sched for sx 8/12 Media Information  Document Information  AMB Correspondence  PT NOTE ABOUT INJECTIONS  05/11/2023 14:51  Attached To:  Ryan Half "Shon Hale"  Source Information  Default, Provider, MD  Document History

## 2023-05-13 ENCOUNTER — Ambulatory Visit: Payer: Medicare Other | Admitting: Family Medicine

## 2023-05-17 ENCOUNTER — Encounter: Payer: Self-pay | Admitting: Orthopedic Surgery

## 2023-05-17 ENCOUNTER — Ambulatory Visit: Payer: Medicare Other

## 2023-05-17 ENCOUNTER — Ambulatory Visit: Payer: Medicare Other | Admitting: Urgent Care

## 2023-05-17 ENCOUNTER — Other Ambulatory Visit: Payer: Self-pay

## 2023-05-17 ENCOUNTER — Observation Stay
Admission: RE | Admit: 2023-05-17 | Discharge: 2023-05-18 | Disposition: A | Discharge status: Home Health | Payer: Medicare Other | Attending: Neurosurgery | Admitting: Neurosurgery

## 2023-05-17 ENCOUNTER — Encounter: Payer: Self-pay | Admitting: Neurosurgery

## 2023-05-17 ENCOUNTER — Encounter
Admission: RE | Disposition: A | Discharge status: Home Health | Payer: Self-pay | Source: Home / Self Care | Attending: Neurosurgery

## 2023-05-17 DIAGNOSIS — Z7901 Long term (current) use of anticoagulants: Secondary | ICD-10-CM | POA: Insufficient documentation

## 2023-05-17 DIAGNOSIS — Z951 Presence of aortocoronary bypass graft: Secondary | ICD-10-CM | POA: Diagnosis not present

## 2023-05-17 DIAGNOSIS — I5032 Chronic diastolic (congestive) heart failure: Secondary | ICD-10-CM | POA: Diagnosis not present

## 2023-05-17 DIAGNOSIS — Z87891 Personal history of nicotine dependence: Secondary | ICD-10-CM | POA: Insufficient documentation

## 2023-05-17 DIAGNOSIS — Z79899 Other long term (current) drug therapy: Secondary | ICD-10-CM | POA: Diagnosis not present

## 2023-05-17 DIAGNOSIS — N183 Chronic kidney disease, stage 3 unspecified: Secondary | ICD-10-CM | POA: Insufficient documentation

## 2023-05-17 DIAGNOSIS — Z01812 Encounter for preprocedural laboratory examination: Secondary | ICD-10-CM

## 2023-05-17 DIAGNOSIS — J449 Chronic obstructive pulmonary disease, unspecified: Secondary | ICD-10-CM | POA: Insufficient documentation

## 2023-05-17 DIAGNOSIS — Z96651 Presence of right artificial knee joint: Secondary | ICD-10-CM | POA: Diagnosis not present

## 2023-05-17 DIAGNOSIS — I130 Hypertensive heart and chronic kidney disease with heart failure and stage 1 through stage 4 chronic kidney disease, or unspecified chronic kidney disease: Secondary | ICD-10-CM | POA: Diagnosis not present

## 2023-05-17 DIAGNOSIS — Z8673 Personal history of transient ischemic attack (TIA), and cerebral infarction without residual deficits: Secondary | ICD-10-CM | POA: Insufficient documentation

## 2023-05-17 DIAGNOSIS — Z01818 Encounter for other preprocedural examination: Secondary | ICD-10-CM

## 2023-05-17 DIAGNOSIS — M48062 Spinal stenosis, lumbar region with neurogenic claudication: Secondary | ICD-10-CM | POA: Diagnosis not present

## 2023-05-17 DIAGNOSIS — I2510 Atherosclerotic heart disease of native coronary artery without angina pectoris: Secondary | ICD-10-CM | POA: Insufficient documentation

## 2023-05-17 DIAGNOSIS — I480 Paroxysmal atrial fibrillation: Secondary | ICD-10-CM | POA: Insufficient documentation

## 2023-05-17 DIAGNOSIS — Z9889 Other specified postprocedural states: Secondary | ICD-10-CM

## 2023-05-17 DIAGNOSIS — E875 Hyperkalemia: Principal | ICD-10-CM

## 2023-05-17 HISTORY — DX: Anxiety disorder, unspecified: F41.9

## 2023-05-17 HISTORY — DX: Other cervical disc degeneration, unspecified cervical region: M50.30

## 2023-05-17 HISTORY — DX: Other specified health status: Z78.9

## 2023-05-17 HISTORY — DX: Long term (current) use of aspirin: Z79.82

## 2023-05-17 HISTORY — DX: Unspecified right bundle-branch block: I45.10

## 2023-05-17 HISTORY — DX: Unspecified sensorineural hearing loss: H90.5

## 2023-05-17 HISTORY — DX: Spinal stenosis, lumbar region with neurogenic claudication: M48.062

## 2023-05-17 HISTORY — DX: Long term (current) use of anticoagulants: Z79.01

## 2023-05-17 HISTORY — PX: LUMBAR LAMINECTOMY/DECOMPRESSION MICRODISCECTOMY: SHX5026

## 2023-05-17 HISTORY — DX: Atherosclerosis of aorta: I70.0

## 2023-05-17 LAB — POCT I-STAT, CHEM 8
BUN: 28 mg/dL — ABNORMAL HIGH (ref 8–23)
Calcium, Ion: 1.16 mmol/L (ref 1.15–1.40)
Chloride: 104 mmol/L (ref 98–111)
Creatinine, Ser: 1 mg/dL (ref 0.61–1.24)
Glucose, Bld: 93 mg/dL (ref 70–99)
HCT: 38 % — ABNORMAL LOW (ref 39.0–52.0)
Hemoglobin: 12.9 g/dL — ABNORMAL LOW (ref 13.0–17.0)
Potassium: 4.1 mmol/L (ref 3.5–5.1)
Sodium: 138 mmol/L (ref 135–145)
TCO2: 24 mmol/L (ref 22–32)

## 2023-05-17 SURGERY — LUMBAR LAMINECTOMY/DECOMPRESSION MICRODISCECTOMY 1 LEVEL
Anesthesia: General | Site: Spine Lumbar

## 2023-05-17 MED ORDER — OXYCODONE HCL 5 MG/5ML PO SOLN: 5.0000 mg | Freq: Once | ORAL | Status: DC | PRN

## 2023-05-17 MED ORDER — FUROSEMIDE 20 MG PO TABS: 20.0000 mg | ORAL_TABLET | ORAL | Status: DC | PRN

## 2023-05-17 MED ORDER — DIAZEPAM 5 MG PO TABS: 2.5000 mg | ORAL_TABLET | Freq: Two times a day (BID) | ORAL | Status: DC | PRN

## 2023-05-17 MED ORDER — PHENOL 1.4 % MT LIQD: 1.0000 | OROMUCOSAL | Status: DC | PRN

## 2023-05-17 MED ORDER — FENTANYL CITRATE (PF) 100 MCG/2ML IJ SOLN
INTRAMUSCULAR | Status: AC
  Filled 2023-05-17: qty 2

## 2023-05-17 MED ORDER — BISACODYL 10 MG RE SUPP: 10.0000 mg | Freq: Every day | RECTAL | Status: DC | PRN

## 2023-05-17 MED ORDER — EPHEDRINE SULFATE (PRESSORS) 50 MG/ML IJ SOLN
INTRAMUSCULAR | Status: DC | PRN
  Administered 2023-05-17 (×6): 5 mg via INTRAVENOUS

## 2023-05-17 MED ORDER — LIDOCAINE HCL (CARDIAC) PF 100 MG/5ML IV SOSY
PREFILLED_SYRINGE | INTRAVENOUS | Status: DC | PRN
  Administered 2023-05-17: 80 mg via INTRAVENOUS

## 2023-05-17 MED ORDER — PROPOFOL 10 MG/ML IV BOLUS
INTRAVENOUS | Status: AC
  Filled 2023-05-17: qty 20

## 2023-05-17 MED ORDER — ACETAMINOPHEN 10 MG/ML IV SOLN
INTRAVENOUS | Status: DC | PRN
  Administered 2023-05-17: 1000 mg via INTRAVENOUS

## 2023-05-17 MED ORDER — OXYCODONE HCL 5 MG PO TABS
10.0000 mg | ORAL_TABLET | ORAL | Status: DC | PRN
  Administered 2023-05-17 (×2): 10 mg via ORAL
  Filled 2023-05-17: qty 2

## 2023-05-17 MED ORDER — TAMSULOSIN HCL 0.4 MG PO CAPS
0.8000 mg | ORAL_CAPSULE | Freq: Every day | ORAL | Status: DC
  Administered 2023-05-17: 0.8 mg via ORAL
  Filled 2023-05-17 (×2): qty 2

## 2023-05-17 MED ORDER — FENTANYL CITRATE (PF) 100 MCG/2ML IJ SOLN
INTRAMUSCULAR | Status: DC | PRN
  Administered 2023-05-17: 25 ug via INTRAVENOUS
  Administered 2023-05-17: 50 ug via INTRAVENOUS
  Administered 2023-05-17: 25 ug via INTRAVENOUS
  Administered 2023-05-17: 50 ug via INTRAVENOUS

## 2023-05-17 MED ORDER — LEVALBUTEROL TARTRATE 45 MCG/ACT IN AERO: 2.0000 | INHALATION_SPRAY | RESPIRATORY_TRACT | Status: DC | PRN

## 2023-05-17 MED ORDER — ORAL CARE MOUTH RINSE: 15.0000 mL | Freq: Once | OROMUCOSAL | Status: AC

## 2023-05-17 MED ORDER — ENOXAPARIN SODIUM 40 MG/0.4ML IJ SOSY
40.0000 mg | PREFILLED_SYRINGE | INTRAMUSCULAR | Status: DC
  Administered 2023-05-18: 40 mg via SUBCUTANEOUS
  Filled 2023-05-17: qty 0.4

## 2023-05-17 MED ORDER — SODIUM CHLORIDE 0.9 % IV SOLN: 250.0000 mL | INTRAVENOUS | Status: DC

## 2023-05-17 MED ORDER — OMEPRAZOLE MAGNESIUM 20 MG PO TBEC: 20.0000 mg | DELAYED_RELEASE_TABLET | ORAL | Status: DC

## 2023-05-17 MED ORDER — DOCUSATE SODIUM 100 MG PO CAPS
100.0000 mg | ORAL_CAPSULE | Freq: Two times a day (BID) | ORAL | Status: DC
  Administered 2023-05-17 – 2023-05-18 (×3): 100 mg via ORAL
  Filled 2023-05-17 (×3): qty 1

## 2023-05-17 MED ORDER — METHYLPREDNISOLONE ACETATE 40 MG/ML IJ SUSP
INTRAMUSCULAR | Status: AC
  Filled 2023-05-17: qty 1

## 2023-05-17 MED ORDER — MAGNESIUM CITRATE PO SOLN: 1.0000 | Freq: Once | ORAL | Status: DC | PRN

## 2023-05-17 MED ORDER — SODIUM CHLORIDE 0.9% FLUSH: 3.0000 mL | INTRAVENOUS | Status: DC | PRN

## 2023-05-17 MED ORDER — ONDANSETRON HCL 4 MG/2ML IJ SOLN: 4.0000 mg | Freq: Four times a day (QID) | INTRAMUSCULAR | Status: DC | PRN

## 2023-05-17 MED ORDER — MIDAZOLAM HCL 5 MG/5ML IJ SOLN
INTRAMUSCULAR | Status: DC | PRN
  Administered 2023-05-17: 1 mg via INTRAVENOUS

## 2023-05-17 MED ORDER — SODIUM CHLORIDE 0.9 % IV SOLN: INTRAVENOUS | Status: DC

## 2023-05-17 MED ORDER — LIDOCAINE HCL (PF) 2 % IJ SOLN
INTRAMUSCULAR | Status: AC
  Filled 2023-05-17: qty 5

## 2023-05-17 MED ORDER — METOPROLOL TARTRATE 50 MG PO TABS
100.0000 mg | ORAL_TABLET | Freq: Two times a day (BID) | ORAL | Status: DC
  Administered 2023-05-17 – 2023-05-18 (×2): 100 mg via ORAL
  Filled 2023-05-17 (×2): qty 2

## 2023-05-17 MED ORDER — MIDAZOLAM HCL 2 MG/2ML IJ SOLN
INTRAMUSCULAR | Status: AC
  Filled 2023-05-17: qty 2

## 2023-05-17 MED ORDER — FENTANYL CITRATE (PF) 100 MCG/2ML IJ SOLN: 25.0000 ug | INTRAMUSCULAR | Status: DC | PRN

## 2023-05-17 MED ORDER — ACETAMINOPHEN 10 MG/ML IV SOLN
INTRAVENOUS | Status: AC
  Filled 2023-05-17: qty 100

## 2023-05-17 MED ORDER — CEFAZOLIN SODIUM-DEXTROSE 2-4 GM/100ML-% IV SOLN
2.0000 g | Freq: Once | INTRAVENOUS | Status: AC
  Administered 2023-05-17: 2 g via INTRAVENOUS

## 2023-05-17 MED ORDER — ALBUTEROL SULFATE (2.5 MG/3ML) 0.083% IN NEBU: 2.5000 mg | INHALATION_SOLUTION | RESPIRATORY_TRACT | Status: DC | PRN

## 2023-05-17 MED ORDER — METHOCARBAMOL 1000 MG/10ML IJ SOLN
500.0000 mg | Freq: Four times a day (QID) | INTRAVENOUS | Status: DC | PRN
  Administered 2023-05-17: 500 mg via INTRAVENOUS
  Filled 2023-05-17: qty 500

## 2023-05-17 MED ORDER — MENTHOL 3 MG MT LOZG: 1.0000 | LOZENGE | OROMUCOSAL | Status: DC | PRN

## 2023-05-17 MED ORDER — POLYETHYLENE GLYCOL 3350 17 G PO PACK: 17.0000 g | PACK | Freq: Every day | ORAL | Status: DC | PRN

## 2023-05-17 MED ORDER — METHYLPREDNISOLONE ACETATE 40 MG/ML IJ SUSP
INTRAMUSCULAR | Status: DC | PRN
  Administered 2023-05-17: 40 mg

## 2023-05-17 MED ORDER — SODIUM CHLORIDE FLUSH 0.9 % IV SOLN
INTRAVENOUS | Status: AC
  Filled 2023-05-17: qty 20

## 2023-05-17 MED ORDER — KETOROLAC TROMETHAMINE 15 MG/ML IJ SOLN
INTRAMUSCULAR | Status: AC
  Filled 2023-05-17: qty 1

## 2023-05-17 MED ORDER — OXYCODONE HCL 5 MG PO TABS
5.0000 mg | ORAL_TABLET | ORAL | Status: DC | PRN
  Filled 2023-05-17: qty 1

## 2023-05-17 MED ORDER — SURGIFLO WITH THROMBIN (HEMOSTATIC MATRIX KIT) OPTIME
TOPICAL | Status: DC | PRN
  Administered 2023-05-17: 1 via TOPICAL

## 2023-05-17 MED ORDER — CEFAZOLIN SODIUM-DEXTROSE 2-4 GM/100ML-% IV SOLN
INTRAVENOUS | Status: AC
  Filled 2023-05-17: qty 100

## 2023-05-17 MED ORDER — EPHEDRINE 5 MG/ML INJ
INTRAVENOUS | Status: AC
  Filled 2023-05-17: qty 5

## 2023-05-17 MED ORDER — DEXAMETHASONE SODIUM PHOSPHATE 10 MG/ML IJ SOLN
INTRAMUSCULAR | Status: DC | PRN
  Administered 2023-05-17: 10 mg via INTRAVENOUS

## 2023-05-17 MED ORDER — ACETAMINOPHEN 500 MG PO TABS
1000.0000 mg | ORAL_TABLET | Freq: Four times a day (QID) | ORAL | Status: DC
  Administered 2023-05-17 – 2023-05-18 (×4): 1000 mg via ORAL
  Filled 2023-05-17 (×5): qty 2

## 2023-05-17 MED ORDER — METHOCARBAMOL 500 MG PO TABS
500.0000 mg | ORAL_TABLET | Freq: Four times a day (QID) | ORAL | Status: DC | PRN
  Administered 2023-05-17 – 2023-05-18 (×2): 500 mg via ORAL
  Filled 2023-05-17 (×3): qty 1

## 2023-05-17 MED ORDER — FERROUS SULFATE 325 (65 FE) MG PO TABS
325.0000 mg | ORAL_TABLET | ORAL | Status: DC
  Administered 2023-05-17: 325 mg via ORAL
  Filled 2023-05-17: qty 1

## 2023-05-17 MED ORDER — ONDANSETRON HCL 4 MG PO TABS: 4.0000 mg | ORAL_TABLET | Freq: Four times a day (QID) | ORAL | Status: DC | PRN

## 2023-05-17 MED ORDER — SUCCINYLCHOLINE CHLORIDE 200 MG/10ML IV SOSY
PREFILLED_SYRINGE | INTRAVENOUS | Status: DC | PRN
  Administered 2023-05-17: 100 mg via INTRAVENOUS

## 2023-05-17 MED ORDER — CHLORHEXIDINE GLUCONATE 0.12 % MT SOLN
OROMUCOSAL | Status: AC
  Filled 2023-05-17: qty 15

## 2023-05-17 MED ORDER — OXYCODONE HCL 5 MG PO TABS
ORAL_TABLET | ORAL | Status: AC
  Filled 2023-05-17: qty 2

## 2023-05-17 MED ORDER — BUDESONIDE 3 MG PO CPEP
6.0000 mg | ORAL_CAPSULE | ORAL | Status: DC
  Administered 2023-05-18: 6 mg via ORAL
  Filled 2023-05-17: qty 2

## 2023-05-17 MED ORDER — LOPERAMIDE HCL 2 MG PO CAPS: 2.0000 mg | ORAL_CAPSULE | Freq: Four times a day (QID) | ORAL | Status: DC | PRN

## 2023-05-17 MED ORDER — CHLORHEXIDINE GLUCONATE 0.12 % MT SOLN
15.0000 mL | Freq: Once | OROMUCOSAL | Status: AC
  Administered 2023-05-17: 15 mL via OROMUCOSAL

## 2023-05-17 MED ORDER — ONDANSETRON HCL 4 MG/2ML IJ SOLN
INTRAMUSCULAR | Status: AC
  Filled 2023-05-17: qty 2

## 2023-05-17 MED ORDER — VITAMIN D 25 MCG (1000 UNIT) PO TABS
10000.0000 ug | ORAL_TABLET | ORAL | Status: DC
  Filled 2023-05-17: qty 1

## 2023-05-17 MED ORDER — DEXAMETHASONE SODIUM PHOSPHATE 10 MG/ML IJ SOLN
INTRAMUSCULAR | Status: AC
  Filled 2023-05-17: qty 1

## 2023-05-17 MED ORDER — PROPOFOL 10 MG/ML IV BOLUS
INTRAVENOUS | Status: DC | PRN
Start: 2023-05-17 — End: 2023-05-17
  Administered 2023-05-17: 120 mg via INTRAVENOUS

## 2023-05-17 MED ORDER — OXYCODONE HCL 5 MG PO TABS: 5.0000 mg | ORAL_TABLET | Freq: Once | ORAL | Status: DC | PRN

## 2023-05-17 MED ORDER — MAGNESIUM OXIDE -MG SUPPLEMENT 400 (240 MG) MG PO TABS
400.0000 mg | ORAL_TABLET | Freq: Three times a day (TID) | ORAL | Status: DC
  Administered 2023-05-17 – 2023-05-18 (×3): 400 mg via ORAL
  Filled 2023-05-17 (×3): qty 1

## 2023-05-17 MED ORDER — PANTOPRAZOLE SODIUM 40 MG PO TBEC
40.0000 mg | DELAYED_RELEASE_TABLET | Freq: Every day | ORAL | Status: DC
  Administered 2023-05-18: 40 mg via ORAL
  Filled 2023-05-17: qty 1

## 2023-05-17 MED ORDER — 0.9 % SODIUM CHLORIDE (POUR BTL) OPTIME
TOPICAL | Status: DC | PRN
  Administered 2023-05-17: 500 mL

## 2023-05-17 MED ORDER — SUCCINYLCHOLINE CHLORIDE 200 MG/10ML IV SOSY
PREFILLED_SYRINGE | INTRAVENOUS | Status: AC
  Filled 2023-05-17: qty 10

## 2023-05-17 MED ORDER — LATANOPROST 0.005 % OP SOLN
1.0000 [drp] | Freq: Every day | OPHTHALMIC | Status: DC
  Administered 2023-05-17: 1 [drp] via OPHTHALMIC
  Filled 2023-05-17: qty 2.5

## 2023-05-17 MED ORDER — EPINEPHRINE PF 1 MG/ML IJ SOLN
INTRAMUSCULAR | Status: AC
  Filled 2023-05-17: qty 1

## 2023-05-17 MED ORDER — KETOROLAC TROMETHAMINE 15 MG/ML IJ SOLN
7.5000 mg | Freq: Four times a day (QID) | INTRAMUSCULAR | Status: AC
  Administered 2023-05-17 – 2023-05-18 (×4): 7.5 mg via INTRAVENOUS
  Filled 2023-05-17 (×3): qty 1

## 2023-05-17 MED ORDER — BUPIVACAINE LIPOSOME 1.3 % IJ SUSP
INTRAMUSCULAR | Status: AC
  Filled 2023-05-17: qty 20

## 2023-05-17 MED ORDER — LACTATED RINGERS IV SOLN: INTRAVENOUS | Status: DC

## 2023-05-17 MED ORDER — SENNA 8.6 MG PO TABS
1.0000 | ORAL_TABLET | Freq: Two times a day (BID) | ORAL | Status: DC
  Administered 2023-05-17 – 2023-05-18 (×3): 8.6 mg via ORAL
  Filled 2023-05-17 (×3): qty 1

## 2023-05-17 MED ORDER — SODIUM CHLORIDE 0.9% FLUSH
3.0000 mL | Freq: Two times a day (BID) | INTRAVENOUS | Status: DC
  Administered 2023-05-17 (×2): 3 mL via INTRAVENOUS

## 2023-05-17 MED ORDER — ONDANSETRON HCL 4 MG/2ML IJ SOLN
INTRAMUSCULAR | Status: DC | PRN
  Administered 2023-05-17: 4 mg via INTRAVENOUS

## 2023-05-17 MED ORDER — BUPIVACAINE HCL (PF) 0.5 % IJ SOLN
INTRAMUSCULAR | Status: AC
  Filled 2023-05-17: qty 30

## 2023-05-17 MED ORDER — PHENYLEPHRINE HCL (PRESSORS) 10 MG/ML IV SOLN
INTRAVENOUS | Status: DC | PRN
  Administered 2023-05-17: 80 ug via INTRAVENOUS
  Administered 2023-05-17: 40 ug via INTRAVENOUS
  Administered 2023-05-17: 80 ug via INTRAVENOUS
  Administered 2023-05-17 (×2): 40 ug via INTRAVENOUS

## 2023-05-17 SURGICAL SUPPLY — 38 items
ADH SKN CLS APL DERMABOND .7 (GAUZE/BANDAGES/DRESSINGS) ×1
AGENT HMST KT MTR STRL THRMB (HEMOSTASIS) ×1
BASIN KIT SINGLE STR (MISCELLANEOUS) ×1 IMPLANT
BUR NEURO DRILL SOFT 3.0X3.8M (BURR) ×1 IMPLANT
CNTNR URN SCR LID CUP LEK RST (MISCELLANEOUS) ×1 IMPLANT
CONT SPEC 4OZ STRL OR WHT (MISCELLANEOUS) ×1
DERMABOND ADVANCED .7 DNX12 (GAUZE/BANDAGES/DRESSINGS) ×1 IMPLANT
DRAPE C ARM PK CFD 31 SPINE (DRAPES) ×1 IMPLANT
DRAPE LAPAROTOMY 100X77 ABD (DRAPES) ×1 IMPLANT
DRAPE MICROSCOPE SPINE 48X150 (DRAPES) IMPLANT
ELECT EZSTD 165MM 6.5IN (MISCELLANEOUS) ×1
ELECT REM PT RETURN 9FT ADLT (ELECTROSURGICAL) ×1
ELECTRODE EZSTD 165MM 6.5IN (MISCELLANEOUS) ×1 IMPLANT
ELECTRODE REM PT RTRN 9FT ADLT (ELECTROSURGICAL) ×1 IMPLANT
GLOVE BIOGEL PI IND STRL 6.5 (GLOVE) ×1 IMPLANT
GLOVE SURG SYN 6.5 ES PF (GLOVE) ×1 IMPLANT
GLOVE SURG SYN 6.5 PF PI (GLOVE) ×1 IMPLANT
GLOVE SURG SYN 8.5 E (GLOVE) ×3 IMPLANT
GLOVE SURG SYN 8.5 PF PI (GLOVE) ×3 IMPLANT
GOWN SRG LRG LVL 4 IMPRV REINF (GOWNS) ×1 IMPLANT
GOWN SRG XL LVL 3 NONREINFORCE (GOWNS) ×1 IMPLANT
GOWN STRL NON-REIN TWL XL LVL3 (GOWNS) ×1
GOWN STRL REIN LRG LVL4 (GOWNS) ×1
KIT SPINAL PRONEVIEW (KITS) ×1 IMPLANT
MANIFOLD NEPTUNE II (INSTRUMENTS) ×1 IMPLANT
MARKER SKIN DUAL TIP RULER LAB (MISCELLANEOUS) ×1 IMPLANT
NDL SAFETY ECLIP 18X1.5 (MISCELLANEOUS) ×1 IMPLANT
NS IRRIG 1000ML POUR BTL (IV SOLUTION) ×1 IMPLANT
PACK LAMINECTOMY ARMC (PACKS) ×1 IMPLANT
SURGIFLO W/THROMBIN 8M KIT (HEMOSTASIS) ×1 IMPLANT
SUT DVC VLOC 3-0 CL 6 P-12 (SUTURE) ×1 IMPLANT
SUT VIC AB 0 CT1 27 (SUTURE) ×1
SUT VIC AB 0 CT1 27XCR 8 STRN (SUTURE) ×1 IMPLANT
SUT VIC AB 2-0 CT1 18 (SUTURE) ×1 IMPLANT
SYR 30ML LL (SYRINGE) ×2 IMPLANT
SYR 3ML LL SCALE MARK (SYRINGE) ×1 IMPLANT
TRAP FLUID SMOKE EVACUATOR (MISCELLANEOUS) ×1 IMPLANT
WATER STERILE IRR 1000ML POUR (IV SOLUTION) ×2 IMPLANT

## 2023-05-17 NOTE — Discharge Instructions (Signed)
Your surgeon has performed an operation on your lumbar spine (low back) to relieve pressure on one or more nerves. Many times, patients feel better immediately after surgery and can "overdo it." Even if you feel well, it is important that you follow these activity guidelines. If you do not let your back heal properly from the surgery, you can increase the chance of a disc herniation and/or return of your symptoms. The following are instructions to help in your recovery once you have been discharged from the hospital.  * It is ok to take NSAIDs after surgery. *resume Eliquis per preop instructions  Activity    No bending, lifting, or twisting ("BLT"). Avoid lifting objects heavier than 10 pounds (gallon milk jug).  Where possible, avoid household activities that involve lifting, bending, pushing, or pulling such as laundry, vacuuming, grocery shopping, and childcare. Try to arrange for help from friends and family for these activities while your back heals.  Increase physical activity slowly as tolerated.  Taking short walks is encouraged, but avoid strenuous exercise. Do not jog, run, bicycle, lift weights, or participate in any other exercises unless specifically allowed by your doctor. Avoid prolonged sitting, including car rides.  Talk to your doctor before resuming sexual activity.  You should not drive until cleared by your doctor.  Until released by your doctor, you should not return to work or school.  You should rest at home and let your body heal.   You may shower three days after your surgery.  After showering, lightly dab your incision dry. Do not take a tub bath or go swimming for 3 weeks, or until approved by your doctor at your follow-up appointment.  If you smoke, we strongly recommend that you quit.  Smoking has been proven to interfere with normal healing in your back and will dramatically reduce the success rate of your surgery. Please contact [facility] (800-QUIT-NOW) and use  the resources at www.[facility].com for assistance in stopping smoking.  Surgical Incision   If you have a dressing on your incision, you may remove it three days after your surgery. Keep your incision area clean and dry.  If you have staples or stitches on your incision, you should have a follow up scheduled for removal. If you do not have staples or stitches, you will have steri-strips (small pieces of surgical tape) or Dermabond glue. The steri-strips/glue should begin to peel away within about a week (it is fine if the steri-strips fall off before then). If the strips are still in place one week after your surgery, you may gently remove them.  Diet            You may return to your usual diet. Be sure to stay hydrated.  When to Contact Ryan [name]  Although your surgery and recovery will likely be uneventful, you may have some residual numbness, aches, and pains in your back and/or legs. This is normal and should improve in the next few weeks.  However, should you experience any of the following, contact Ryan [name] immediately: New numbness or weakness Pain that is progressively getting worse, and is not relieved by your pain medications or rest Bleeding, redness, swelling, pain, or drainage from surgical incision Chills or flu-like symptoms Fever greater than 101.0 F (38.3 C) Problems with bowel or bladder functions Difficulty breathing or shortness of breath Warmth, tenderness, or swelling in your calf  Contact Information How to contact Ryan [name]:  If you have any questions/concerns before or after surgery, you can reach Ryan [name]  at [phone], or you can send a mychart message. We can be reached by phone or mychart 8am-4pm, Monday-Friday.  *Please note: Calls after 4pm are forwarded to a third party answering service. Mychart messages are not routinely monitored during evenings, weekends, and holidays. Please call our office to contact the answering service for urgent concerns during non-business hours.

## 2023-05-17 NOTE — Telephone Encounter (Signed)
Put in chart and made note regarding this.

## 2023-05-17 NOTE — Transfer of Care (Signed)
Immediate Anesthesia Transfer of Care Note  Patient: Ryan [name]  Procedure(s) Performed: L4-5 DECOMPRESSION (Spine Lumbar)  Patient Location: PACU  Anesthesia Type:General  Level of Consciousness: awake, alert , and oriented  Airway & Oxygen Therapy: Patient Spontanous Breathing and Patient connected to face mask oxygen  Post-op Assessment: Report given to RN and Post -op Vital signs reviewed and stable  Post vital signs: Reviewed and stable  Last Vitals:  Vitals Value Taken Time  BP 158/73 05/17/23 1115  Temp    Pulse 73 05/17/23 1117  Resp 10 05/17/23 1117  SpO2 100 % 05/17/23 1117  Vitals shown include unfiled device data.  Last Pain: There were no vitals filed for this visit.       Complications: No notable events documented.

## 2023-05-17 NOTE — Progress Notes (Signed)
Patient awake/alert x4. Able to bend bil lowr ext, weaker on left side, patient states pre-op state. Does c/o's chronic lower back discomfort, medicated as ordered.  Neuro intact.   Family updated.

## 2023-05-17 NOTE — Anesthesia Procedure Notes (Signed)
Procedure Name: Intubation Date/Time: 05/17/2023 9:43 AM  Performed by: Malva Cogan, CRNAPre-anesthesia Checklist: Patient identified, Patient being monitored, Timeout performed, Emergency Drugs available and Suction available Patient Re-evaluated:Patient Re-evaluated prior to induction Oxygen Delivery Method: Circle system utilized Preoxygenation: Pre-oxygenation with 100% oxygen Induction Type: IV induction Ventilation: Mask ventilation without difficulty Laryngoscope Size: McGraph and 4 Grade View: Grade I Tube type: Oral Tube size: 7.0 mm Number of attempts: 1 Airway Equipment and Method: Stylet and Video-laryngoscopy Placement Confirmation: ETT inserted through vocal cords under direct vision, positive ETCO2 and breath sounds checked- equal and bilateral Secured at: 23 cm Tube secured with: Tape Dental Injury: Teeth and Oropharynx as per pre-operative assessment

## 2023-05-17 NOTE — H&P (Signed)
Referring Physician:  Venetia Night, MD [address] [place],  [place] [address]  Primary Physician:  [name], MD  History of Present Illness: 05/17/2023 Mr. Rudden returns with continued symptoms.  04/01/2023 Mr. Nickalaus Sherfield is here today with a chief complaint of bilateral buttock and posterior thigh pain when he walks.  He has been having this for some time, but is been worsening over time and making it harder for him to get about his day-to-day life.  He is bending forward when he walks.  He has to get a cart when he walks in the store.  His legs feel tired and he has to sit to improve his pain.  He has tried physical therapy without improvement.    [name]'s note: 02/25/2023 Mr. Flamur Shehee has a history of BPH, CAD, heart failure, CKD 3, COPD, GERD, TIA, HTN, prostate CA, osteoporosis, PAD, CVA, and afib.    He has seen Dr. Jule Ser in the past.    He has chronic constant LBP with posterior bilateral leg pain to his knees, occasionally to his feet. Buttock pain is his worst pain. Pain is worse with walking. Some relief with grocery cart and tylenol. No numbness or tingling in his legs. His legs feel heavy and fatigued. They feel tired. He has weakness in the legs.    He is on ELIQUIS.    Bowel/Bladder Dysfunction: none   Conservative measures:  Physical therapy: [facility] initial eval for back on 06/24/22- he thinks he did 5-6 weeks of PT and does HEP.  Multimodal medical therapy including regular antiinflammatories: Tylenol Injections:  Had ESIs in the past, none in last 3 years.    Past Surgery:  ACDF C5-C7 2003      The symptoms are causing a significant impact on the patient's life.   I have utilized the care everywhere function in epic to review the outside records available from external health systems.  Review of Systems:  A 10 point review of systems is negative, except for the pertinent positives and negatives  detailed in the HPI.  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; Gleason 5+5; PSA 1.8 --> Tx'd with XRT (12/10/21 - 02/18/22) + LT-ADT; XRT course truncated due to severe radiation colitis   Allergy    Anemia    Anxiety    a.) on BZO PRN (diazepam)   Aortic atherosclerosis (HCC)    Bilateral carotid artery disease (HCC) 03/24/2021   a.) carotid doppler 03/24/2021: 1-39% BICA   BPH with urinary obstruction    CAD (coronary artery disease) 03/26/2011   a.) cCTA 03/26/2011: Ca2+ 505 (pRCA + LM); b.) LHC 04/01/2011: 40-50% oLM, 60-70% m-dLAD, 30% mD2, 60% pRCA - med mgmt; c.) LHC 10/17/2022L 90% oLM, 60% o-pLCx, 95% pRCA, 70% dRCA, 70% RPDA --> CVTS consult; d.) s/p 4v CABG 07/22/2021   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   Chronic diastolic CHF (congestive heart failure) (HCC) 01/14/2017   a.) TTE 01/14/2017: TTE 01/14/2017: EF 55-60%, sev LA dil, triv MR, G1DD; b.) TTE 03/23/2018: EF 55-60%, mod LVH, sev LA dil, mod-sev RA dil; c.) TTE 04/17/2020: EF 60-65%, mod asym ant seg LVH, mild LA dil, mild MR, G1DD; d.) TTE 03/25/2021: EF 50-55%, AoV sclerosis, triv MR   CKD (chronic kidney disease), stage III (HCC)  COPD (chronic obstructive pulmonary disease) (HCC)    DDD (degenerative disc disease), cervical    a.) s/p ACDF C5-C7 06/08/2002   DOE (dyspnea on exertion)    Eczema    Emphysema of lung (HCC)    Family history of adverse reaction to anesthesia    a.) PONV and postoperative delirium/agitation in 1st degree relatives (sisters)   GERD (gastroesophageal reflux disease)    Glaucoma, left eye    Heart murmur    Hemorrhoids    History of adenomatous polyp of colon    History of bilateral cataract extraction 2021   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 Barron Alvine--- dx by biopsy 01-28-2021   Macular degeneration of both eyes    followed by dr c. Gwendalyn Ege--- bilateral eye injection every 6 wks   NSVT (nonsustained ventricular tachycardia) (HCC) 03/17/2022   a.) Zio patch 03/17/2022: 3 runs with fastest lasting 10 beats at rate of 190 bpm and longest lasting 16 beats at rate of 121 bpm   On apixaban therapy    Osteoporosis 05/31/2016   PAF (paroxysmal atrial fibrillation) (HCC) 03/2012   a.) CHA2DS2VASc = 7 (age x2, CHF, HTN, TIA x2, vascular disease history);  b.) s/p DCCV 04/25/2018 (150J x 1 --> SB); c.) rate/rhythm maintained on oral metoprolol tartrate; chronically anticoagulated with apixaban   Postoperative ileus (HCC) 07/2021   a.) following CABG procedure   RBBB (right bundle branch block)    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   a.) LIMA-LAD, SVG-OM1, sequential SVG-acute marginal-PDA   SNHL (sensorineural hearing loss)    Statin intolerance    Vision loss of left eye    macular degeneration   Vitamin D deficiency     Past Surgical History: Past Surgical History:  Procedure Laterality Date   ANTERIOR CERVICAL DECOMP/DISCECTOMY FUSION  06/08/2002   APPENDECTOMY  1953   CARDIAC CATHETERIZATION Left 04/01/2011   CARDIOVERSION N/A 04/25/2018   Procedure: CARDIOVERSION;  Surgeon: Chilton Si, MD;  Location: [facility] ENDOSCOPY;  Service: Cardiovascular;  Laterality: N/A;   CATARACT EXTRACTION W/  INTRAOCULAR LENS IMPLANT Bilateral 2021   COLONOSCOPY WITH ESOPHAGOGASTRODUODENOSCOPY (EGD)  01/28/2021   by [facility]   CORONARY ARTERY BYPASS GRAFT N/A 07/22/2021   Procedure: 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, ON PUMP, USING LEFT INTERNAL MAMMARY ARTERY AND ENDOSCOPICALLY HARVESTED RIGHT GREATER SAPHENOUS VEIN;  Surgeon: Loreli Slot, MD;  Location: [facility] OR;  Service: Open Heart Surgery;  Laterality: N/A;   ENDOVEIN HARVEST OF GREATER SAPHENOUS VEIN  07/22/2021   Procedure: ENDOVEIN HARVEST OF GREATER SAPHENOUS VEIN;  Surgeon: Loreli Slot, MD;  Location: [facility] OR;  Service: Open Heart Surgery;;   EXTRACORPOREAL SHOCK WAVE LITHOTRIPSY     x2  1990s   EYE SURGERY  FOOT SURGERY Right 1970   calcification removed from top of foot   GOLD SEED IMPLANT N/A 11/28/2021   Procedure: GOLD SEED IMPLANT;  Surgeon: Jerilee Field, MD;  Location: [facility];  Service: Urology;  Laterality: N/A;   KNEE ARTHROSCOPY Right 1991   LAPAROSCOPIC CHOLECYSTECTOMY  2000   LEFT HEART CATH AND CORONARY ANGIOGRAPHY N/A 07/21/2021   Procedure: LEFT HEART CATH AND CORONARY ANGIOGRAPHY;  Surgeon: Runell Gess, MD;  Location: [facility] INVASIVE CV LAB;  Service: Cardiovascular;  Laterality: N/A;   SPACE OAR INSTILLATION N/A 11/28/2021   Procedure: SPACE OAR INSTILLATION;  Surgeon: Jerilee Field, MD;  Location: [facility];  Service: Urology;  Laterality: N/A;   TEE WITHOUT CARDIOVERSION N/A 07/22/2021   Procedure: TRANSESOPHAGEAL ECHOCARDIOGRAM (TEE);  Surgeon: Loreli Slot, MD;  Location[facility];  Service: Open Heart Surgery;  Laterality: N/A;   TOTAL KNEE ARTHROPLASTY Right 08/14/2009   @[facility]    TRANSURETHRAL RESECTION OF PROSTATE N/A 07/17/2021   Procedure: TRANSURETHRAL RESECTION OF THE PROSTATE (TURP);  Surgeon: Marcine Matar, MD;  Location[facility];  Service: Urology;  Laterality: N/A;  1 HR    Allergies: Allergies as  of 04/15/2023 - Review Complete 04/01/2023  Allergen Reaction Noted   Albumin (human) Anaphylaxis 04/12/2020   Polymyxin b-trimethoprim Swelling 01/28/2012   Pseudoephedrine Other (See Comments) 03/21/2014   Codeine Hives, Itching, and Rash 03/21/2007   Guaiacol Other (See Comments) 06/08/2013   Statins Other (See Comments) 03/21/2007   Brimonidine tartrate Itching and Swelling 10/27/2022   Gabapentin Other (See Comments) 01/28/2021   Meloxicam Other (See Comments) 11/07/2010   Peppermint flavor Other (See Comments) 07/25/2021   Pseudoephedrine-guaifenesin Nausea And Vomiting 02/25/2014   Rosuvastatin calcium Other (See Comments) 04/26/2014   Tapentadol Other (See Comments) 11/07/2010   Ciprofloxacin Hives, Itching, Nausea Only, and Rash 03/21/2007   Moxifloxacin Nausea Only and Other (See Comments) 11/07/2010   Rofecoxib Other (See Comments) 06/08/2013    Medications:  Current Facility-Administered Medications:    ceFAZolin (ANCEF) IVPB 2g/100 mL premix, 2 g, Intravenous, Once, Venetia Night, MD   lactated ringers infusion, , Intravenous, Continuous, Lenard Simmer, MD, Last Rate: 10 mL/hr at 05/17/23 0832, New Bag at 05/17/23 7253  Social History: Social History   Tobacco Use   Smoking status: Former    Current packs/day: 0.00    Average packs/day: 0.5 packs/day for 50.0 years (25.0 ttl pk-yrs)    Types: Cigarettes    Start date: 05/02/1959    Quit date: 05/01/2009    Years since quitting: 14.0   Smokeless tobacco: Former    Types: Chew    Quit date: 1995   Tobacco comments:    Former smoker 01/27/22  Vaping Use   Vaping status: Never Used  Substance Use Topics   Alcohol use: Yes    Alcohol/week: 2.0 standard drinks of alcohol    Types: 2 Cans of beer per week    Comment: 1-2 glasses wine/beer daily   Drug use: Never    Family Medical History: Family History  Problem Relation Age of Onset   Heart disease Mother    Diabetes Mother    Cirrhosis Mother     Emphysema Mother        never smoked but 2nd hand through her spouse   Hypertension Mother    Macular degeneration Mother    Heart disease Father    Cancer Father        prostate   Hyperlipidemia Father  Hypertension Father    Varicose Veins Father    Heart attack Father    Peripheral vascular disease Father    Heart disease Sister    Arthritis Sister    Hyperlipidemia Sister    Obesity Sister    Macular degeneration Sister    Heart disease Brother        5 stents   Hyperlipidemia Brother    Macular degeneration Maternal Grandfather    Cirrhosis Sister    Obesity Sister    Arthritis Sister    Heart disease Sister    Obesity Sister    Liver disease Other    Prostate cancer Other    Coronary artery disease Other    Colon cancer Neg Hx    Esophageal cancer Neg Hx    Rectal cancer Neg Hx    Stomach cancer Neg Hx     Physical Examination: Vitals:   05/17/23 0751  BP: (!) 154/87  Pulse: (!) 59  Resp: 16  Temp: (!) 97.1 F (36.2 C)  SpO2: 99%   Heart sounds irregular no MRG. Chest Clear to Auscultation Bilaterally.   General: Patient is in no apparent distress. Attention to examination is appropriate.  Neck:   Supple.  Full range of motion.  Respiratory: Patient is breathing without any difficulty.   NEUROLOGICAL:     Awake, alert, oriented to person, place, and time.  Speech is clear and fluent.   Cranial Nerves: Pupils equal round and reactive to light.  Facial tone is symmetric.  Facial sensation is symmetric. Shoulder shrug is symmetric. Tongue protrusion is midline.  There is no pronator drift.  Strength: Side Biceps Triceps Deltoid Interossei Grip Wrist Ext. Wrist Flex.  R 5 5 5 5 5 5 5   L 5 5 5 5 5 5 5    Side Iliopsoas Quads Hamstring PF DF EHL  R 5 5 5 5 5 5   L 5 5 5 5 5 5     Bilateral upper and lower extremity sensation is intact to light touch.    No evidence of dysmetria noted.  Gait is untested  Medical Decision  Making  Imaging: MRI L spine 02/28/23 Disc levels:   No disc level abnormality at L2-3 or above.   L3-4: Minimal disc bulge. Minimal facet and ligamentous hypertrophy. No compressive stenosis.   L4-5: Moderate disc bulge. Mild facet and ligamentous hypertrophy. Mild multifactorial stenosis with potential for neural compression in either or both lateral recesses. This has worsened slightly since 2020.   L5-S1: Mild bulging of the disc. No compressive canal or foraminal narrowing.   IMPRESSION: 1. L4-5: Moderate disc bulge. Mild facet and ligamentous hypertrophy. Mild multifactorial stenosis with potential for neural compression in either or both lateral recesses. This has worsened slightly since 2020. 2. L3-4: Minimal disc bulge. Mild facet and ligamentous hypertrophy. No compressive stenosis. 3. L5-S1: Mild disc bulge. No compressive stenosis. 4. Post radiation fatty change of the marrow. No sign of regional osseous metastatic disease.     Electronically Signed   By: Paulina Fusi M.D.   On: 03/08/2023 15:43  I have personally reviewed the images and agree with the above interpretation.  Assessment and Plan: Mr. Malina is a pleasant 77 y.o. male with symptoms of neurogenic claudication.  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  However, that is longstanding.  His more recent issue is the bilateral buttock pain.    He has  tried physical therapy for this without improvement.  At this point, we will proceed with L4-5 lumbar decompression.       K. Myer Haff MD, [facility] Neurosurgery

## 2023-05-17 NOTE — Anesthesia Preprocedure Evaluation (Addendum)
Anesthesia Evaluation  Patient identified by MRN, date of birth, ID band Patient awake    Reviewed: Allergy & Precautions, NPO status , Patient's Chart, lab work & pertinent test results  History of Anesthesia Complications Negative for: history of anesthetic complications  Airway Mallampati: II  TM Distance: >3 FB Neck ROM: full    Dental  (+) Partial Lower   Pulmonary shortness of breath and with exertion, COPD,  COPD inhaler, former smoker   Pulmonary exam normal        Cardiovascular hypertension, + CAD, + CABG, + Peripheral Vascular Disease, +CHF and + DOE  + dysrhythmias Atrial Fibrillation + Valvular Problems/Murmurs   Most recent TTE was performed on 01/13/2022 revealed normal left ventricular systolic function with an EF of 55 to 60%.  There were no regional wall motion abnormalities.  Diastolic Doppler parameters indeterminant.  Right ventricular size and function normal.  There was severe biatrial enlargement.  Mild mitral valve regurgitation noted.  Mild aortic valve sclerosis present.  All transvalvular gradients were noted to be normal providing no evidence suggestive of valvular stenosis.  Aorta normal in size with no evidence of aneurysmal dilatation.   Long-term cardiac event monitor study performed on 03/17/2022 revealing a predominant underlying atrial fibrillation/flutter with occasional PVCs and a bigeminy pattern with heart rates ranging from 52-168 bpm (average 89 bpm). There were no sustained pauses.  3 runs of PSVT noted with the fastest interval lasting 10 beats at a maximum rate of 190 bpm and the longest lasting 16 beats at an average rate of 121 bpm.  There was 100% atrial fibrillation/flutter burden.   Myocardial perfusion imaging study was performed on 04/29/2023 revealing a normal left ventricular systolic function with an EF of 64%.  There was no evidence of stress-induced myocardial ischemia or arrhythmia;  no sonographic evidence of scar.  Study determined be normal and low risk.   Atrial fibrillation Left axis deviation Low voltage QRS Right bundle branch block Cannot rule out Inferior infarct , age undetermined Cannot rule out Anterior infarct , age undetermined When compared with ECG of 02-Oct-2022 09:48, atrial fib with a controlled VR persists    Neuro/Psych  PSYCHIATRIC DISORDERS Anxiety      Neuromuscular disease CVA    GI/Hepatic Neg liver ROS, hiatal hernia,GERD  Controlled,,  Endo/Other  negative endocrine ROS    Renal/GU Renal disease     Musculoskeletal   Abdominal   Peds  Hematology  (+) Blood dyscrasia, anemia   Anesthesia Other Findings Past Medical History: 07/2021: Adenocarcinoma of prostate (HCC)     Comment:  a.) BPH with increasing LUTS --> s/p TURP 07/17/2021 -->              pathology (+) 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; Gleason 5+5; PSA 1.8 --> Tx'd with XRT (12/10/21               - 02/18/22) + LT-ADT; XRT course truncated due to severe               radiation colitis No date: Allergy No date: Anemia No date: Anxiety     Comment:  a.) on BZO PRN (diazepam) No date: Aortic atherosclerosis (HCC) 03/24/2021: Bilateral carotid artery disease (HCC)     Comment:  a.) carotid doppler 03/24/2021: 1-39% BICA 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 03/26/2011: CAD (coronary artery disease)     Comment:  a.)  cCTA 03/26/2011: Ca2+ 505 (pRCA + LM); b.) LHC               04/01/2011: 40-50% oLM, 60-70% m-dLAD, 30% mD2, 60% pRCA               - med mgmt; c.) LHC 10/17/2022L 90% oLM, 60% o-pLCx, 95%               pRCA, 70% dRCA, 70% RPDA --> CVTS consult; d.) s/p 4v               CABG 07/22/2021 03/23/2021: Cerebrovascular small vessel disease 01/14/2017: Chronic diastolic CHF (congestive heart failure) (HCC)     Comment:  a.) TTE 01/14/2017: TTE 01/14/2017: EF 55-60%, sev LA               dil, triv MR, G1DD; b.) TTE 03/23/2018: EF  55-60%, mod               LVH, sev LA dil, mod-sev RA dil; c.) TTE 04/17/2020: EF               60-65%, mod asym ant seg LVH, mild LA dil, mild MR, G1DD;              d.) TTE 03/25/2021: EF 50-55%, AoV sclerosis, triv MR No date: CKD (chronic kidney disease), stage III (HCC) No date: COPD (chronic obstructive pulmonary disease) (HCC) No date: DDD (degenerative disc disease), cervical     Comment:  a.) s/p ACDF C5-C7 06/08/2002 No date: DOE (dyspnea on exertion) No date: Eczema No date: Emphysema of lung (HCC) No date: Family history of adverse reaction to anesthesia     Comment:  a.) PONV and postoperative delirium/agitation in 1st               degree relatives (sisters) No date: GERD (gastroesophageal reflux disease) No date: Glaucoma, left eye No date: Heart murmur No date: Hemorrhoids No date: History of adenomatous polyp of colon 2021: History of bilateral cataract extraction 1962: History of coma     Comment:  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, no               residual No date: History of hiatal hernia No date: History of kidney stones No date: History of rheumatic fever as a child 02/2014: History of syncope     Comment:  in setting AFlutter w/ RVR 03/23/2021: History of transient ischemic attack (TIA)     Comment:  neurologist-- dr Pearlean Brownie;  while on eliquis ,  right v4               vertebral artery stenosis and proximal right PICA               stenosis, mild carotid disease, ef 50-55% No date: History of urinary retention No date: Hypertension No date: Long term (current) use of aspirin No date: Lumbar stenosis with neurogenic claudication No date: Lymphocytic colitis     Comment:  followed by dr v. Barron Alvine--- dx by biopsy 01-28-2021 No date: Macular degeneration of both eyes     Comment:  followed by dr c. Gwendalyn Ege--- bilateral eye injection               every 6 wks 03/17/2022: NSVT (nonsustained ventricular tachycardia) (HCC)     Comment:  a.)  Zio patch 03/17/2022: 3 runs with fastest lasting 10  beats at rate of 190 bpm and longest lasting 16 beats at               rate of 121 bpm No date: On apixaban therapy 05/31/2016: Osteoporosis 03/2012: PAF (paroxysmal atrial fibrillation) (HCC)     Comment:  a.) CHA2DS2VASc = 7 (age x2, CHF, HTN, TIA x2, vascular               disease history);  b.) s/p DCCV 04/25/2018 (150J x 1 -->               SB); c.) rate/rhythm maintained on oral metoprolol               tartrate; chronically anticoagulated with apixaban 07/2021: Postoperative ileus (HCC)     Comment:  a.) following CABG procedure No date: RBBB (right bundle branch block) 07/22/2021: S/P CABG x 4     Comment:  a.) LIMA-LAD, SVG-OM1, sequential SVG-acute marginal-PDA No date: SNHL (sensorineural hearing loss) No date: Statin intolerance No date: Vision loss of left eye     Comment:  macular degeneration No date: Vitamin D deficiency  Past Surgical History: 06/08/2002: ANTERIOR CERVICAL DECOMP/DISCECTOMY FUSION 1953: APPENDECTOMY 04/01/2011: CARDIAC CATHETERIZATION; Left 04/25/2018: CARDIOVERSION; N/A     Comment:  Procedure: CARDIOVERSION;  Surgeon: Chilton Si,               MD;  Location: [facility] ENDOSCOPY;  Service: Cardiovascular;                Laterality: N/A; 2021: CATARACT EXTRACTION W/ INTRAOCULAR LENS IMPLANT; Bilateral 01/28/2021: COLONOSCOPY WITH ESOPHAGOGASTRODUODENOSCOPY (EGD)     Comment:  by [email or web address] 07/22/2021: CORONARY ARTERY BYPASS GRAFT; N/A     Comment:  Procedure: CORONARY ARTERY BYPASS GRAFTING (CABG) TIMES               4, ON PUMP, USING LEFT INTERNAL MAMMARY ARTERY AND               ENDOSCOPICALLY HARVESTED RIGHT GREATER SAPHENOUS VEIN;                Surgeon: Loreli Slot, MD;  Location: [facility] OR;                Service: Open Heart Surgery;  Laterality: N/A; 07/22/2021: ENDOVEIN HARVEST OF GREATER SAPHENOUS VEIN     Comment:  Procedure: ENDOVEIN HARVEST OF GREATER SAPHENOUS  VEIN;                Surgeon: Loreli Slot, MD;  Location: [facility] OR;                Service: Open Heart Surgery;; No date: EXTRACORPOREAL SHOCK WAVE LITHOTRIPSY     Comment:  x2  1990s No date: EYE SURGERY 1970: FOOT SURGERY; Right     Comment:  calcification removed from top of foot 11/28/2021: GOLD SEED IMPLANT; N/A     Comment:  Procedure: GOLD SEED IMPLANT;  Surgeon: Jerilee Field, MD;  Location: [facility];                Service: Urology;  Laterality: N/A; 1991: KNEE ARTHROSCOPY; Right 2000: LAPAROSCOPIC CHOLECYSTECTOMY 07/21/2021: LEFT HEART CATH AND CORONARY ANGIOGRAPHY; N/A     Comment:  Procedure: LEFT HEART CATH AND CORONARY ANGIOGRAPHY;                Surgeon: Runell Gess, MD;  Location:  [facility] INVASIVE CV              LAB;  Service: Cardiovascular;  Laterality: N/A; 11/28/2021: SPACE OAR INSTILLATION; N/A     Comment:  Procedure: SPACE OAR INSTILLATION;  Surgeon: Jerilee Field, MD;  Location: [facility] [place];                Service: Urology;  Laterality: N/A; 07/22/2021: TEE WITHOUT CARDIOVERSION; N/A     Comment:  Procedure: TRANSESOPHAGEAL ECHOCARDIOGRAM (TEE);                Surgeon: Loreli Slot, MD;  Location: [facility] OR;                Service: Open Heart Surgery;  Laterality: N/A; 08/14/2009: TOTAL KNEE ARTHROPLASTY; Right     Comment:  @[facility]  07/17/2021: TRANSURETHRAL RESECTION OF PROSTATE; N/A     Comment:  Procedure: TRANSURETHRAL RESECTION OF THE PROSTATE               (TURP);  Surgeon: Marcine Matar, MD;  Location:               [facility];  Service: Urology;                Laterality: N/A;  1 HR     Reproductive/Obstetrics negative OB ROS                             Anesthesia Physical Anesthesia Plan  ASA: 3  Anesthesia Plan: General ETT   Post-op Pain Management: Toradol IV (intra-op)* and Ofirmev IV (intra-op)*   Induction:  Intravenous  PONV Risk Score and Plan: 2 and Ondansetron, Dexamethasone and Treatment may vary due to age or medical condition  Airway Management Planned: Oral ETT  Additional Equipment:   Intra-op Plan:   Post-operative Plan: Extubation in OR  Informed Consent: I have reviewed the patients History and Physical, chart, labs and discussed the procedure including the risks, benefits and alternatives for the proposed anesthesia with the patient or authorized representative who has indicated his/her understanding and acceptance.     Dental Advisory Given  Plan Discussed with: Anesthesiologist, CRNA and Surgeon  Anesthesia Plan Comments: (Patient consented for risks of anesthesia including but not limited to:  - adverse reactions to medications - damage to eyes, teeth, lips or other oral mucosa - nerve damage due to positioning  - sore throat or hoarseness - Damage to heart, brain, nerves, lungs, other parts of body or loss of life  Patient voiced understanding.)        Anesthesia Quick Evaluation

## 2023-05-17 NOTE — Progress Notes (Addendum)
error 

## 2023-05-17 NOTE — Progress Notes (Signed)
Ryan [name] wanted to let us know that he had lumbar injection L4-S1 on 05/18/18 with Dr. Newell Coral. See telephone call from 05/11/23.

## 2023-05-17 NOTE — Op Note (Signed)
Indications: [name] is suffering from lumbar stenosis causing neurogenic claudication (ICD10 M48.062). The patient tried and failed conservative management, prompting surgical intervention.  Findings: lumbar stenosis  Preoperative Diagnosis: Lumbar Stenosis with neurogenic claudication Postoperative Diagnosis: same   EBL: 10 ml IVF:see anesthesia record Drains: none Disposition: Extubated and Stable to PACU Complications: none  No foley catheter was placed.   Preoperative Note:  Risks of surgery discussed include: infection, bleeding, stroke, coma, death, paralysis, CSF leak, nerve/spinal cord injury, numbness, tingling, weakness, complex regional pain syndrome, recurrent stenosis and/or disc herniation, vascular injury, development of instability, neck/back pain, need for further surgery, persistent symptoms, development of deformity, and the risks of anesthesia. The patient understood these risks and agreed to proceed.  Operative Note:   1. L4-5 lumbar decompression including central laminectomy and bilateral medial facetectomies including foraminotomies  The patient was then brought from the preoperative center with intravenous access established.  The patient underwent general anesthesia and endotracheal tube intubation, and was then rotated on the Hopkins rail top where all pressure points were appropriately padded.  The skin was then thoroughly cleansed.  Perioperative antibiotic prophylaxis was administered.  Sterile prep and drapes were then applied and a timeout was then observed.  C-arm was brought into the field under sterile conditions and under lateral visualization the L4-5 interspace was identified and marked.  The incision was marked on the left and injected with local anesthetic. Once this was complete a 3 cm incision was opened with the use of a #10 blade knife.    The metrx tubes were sequentially advanced and confirmed in position at L4-5. An 18mm by 50mm  tube was locked in place to the bed side attachment.  The microscope was then sterilely brought into the field and muscle creep was hemostased with a bipolar and resected with a pituitary rongeur.  A Bovie extender was then used to expose the spinous process and lamina.  Careful attention was placed to not violate the facet capsule. A 3 mm matchstick drill bit was then used to make a hemi-laminotomy trough until the ligamentum flavum was exposed.  This was extended to the base of the spinous process and to the contralateral side to remove all the central bone from each side.  Once this was complete and the underlying ligamentum flavum was visualized, it was dissected with a curette and resected with Kerrison rongeurs.  Extensive ligamentum hypertrophy was noted, requiring a substantial amount of time and care for removal.  The dura was identified and palpated. The kerrison rongeur was then used to remove the medial facet bilaterally until no compression was noted.  A balltip probe was used to confirm decompression of the ipsilateral L5 nerve root.  Additional attention was paid to completion of the contralateral L4-5 foraminotomy until the contralateral traversing nerve root was completely free.  Once this was complete, L4-5 central decompression including medial facetectomy and foraminotomy was confirmed and decompression on both sides was confirmed. No CSF leak was noted.  Depo-Medrol was placed on the nerve root.  The wound was copiously irrigated. The tube system was then removed under microscopic visualization and hemostasis was obtained with a bipolar.    The fascial layer was reapproximated with the use of a 0 Vicryl suture.  Subcutaneous tissue layer was reapproximated using 2-0 Vicryl suture.  3-0 monocryl was placed in subcuticular fashion. The skin was then cleansed and Dermabond was used to close the skin opening.  Patient was then rotated back to the preoperative  bed awakened from anesthesia and  taken to recovery all counts are correct in this case.  I performed the entire procedure with the assistance of Manning Charity PA as an Designer, television/film set. An assistant was required for this procedure due to the complexity.  The assistant provided assistance in tissue manipulation and suction, and was required for the successful and safe performance of the procedure. I performed the critical portions of the procedure.    K. Myer Haff MD

## 2023-05-18 ENCOUNTER — Encounter: Payer: Self-pay | Admitting: Neurosurgery

## 2023-05-18 DIAGNOSIS — M48062 Spinal stenosis, lumbar region with neurogenic claudication: Secondary | ICD-10-CM | POA: Diagnosis not present

## 2023-05-18 DIAGNOSIS — I5032 Chronic diastolic (congestive) heart failure: Secondary | ICD-10-CM | POA: Diagnosis not present

## 2023-05-18 DIAGNOSIS — I130 Hypertensive heart and chronic kidney disease with heart failure and stage 1 through stage 4 chronic kidney disease, or unspecified chronic kidney disease: Secondary | ICD-10-CM | POA: Diagnosis not present

## 2023-05-18 DIAGNOSIS — I2510 Atherosclerotic heart disease of native coronary artery without angina pectoris: Secondary | ICD-10-CM | POA: Diagnosis not present

## 2023-05-18 DIAGNOSIS — J449 Chronic obstructive pulmonary disease, unspecified: Secondary | ICD-10-CM | POA: Diagnosis not present

## 2023-05-18 DIAGNOSIS — N183 Chronic kidney disease, stage 3 unspecified: Secondary | ICD-10-CM | POA: Diagnosis not present

## 2023-05-18 MED ORDER — SENNA 8.6 MG PO TABS: 1.0000 | ORAL_TABLET | Freq: Every day | ORAL | 0 refills | Status: DC | PRN

## 2023-05-18 MED ORDER — TIZANIDINE HCL 2 MG PO TABS: 2.0000 mg | ORAL_TABLET | Freq: Four times a day (QID) | ORAL | 0 refills | Status: DC | PRN

## 2023-05-18 MED ORDER — OXYCODONE HCL 5 MG PO TABS: 5.0000 mg | ORAL_TABLET | ORAL | 0 refills | Status: DC | PRN

## 2023-05-18 MED ORDER — VITAMIN D 25 MCG (1000 UNIT) PO TABS: 10000.0000 [IU] | ORAL_TABLET | ORAL | Status: DC

## 2023-05-18 NOTE — Discharge Summary (Signed)
Physician Discharge Summary  Patient ID: Ryan [name] MRN: [id number] DOB/AGE: 1946/06/12 77 y.o.  Admit date: 05/17/2023 Discharge date: 05/18/2023  Admission Diagnoses: lumbar stenosis causing neurogenic claudication (ICD10 M48.062).   Discharge Diagnoses:  Principal Problem:   S/P laminectomy Active Problems:   Lumbar stenosis with neurogenic claudication   Discharged Condition: good  Hospital Course:  Bijon Siemon is a 77 year old presenting with lumbar stenosis and symptoms concerning for neurogenic claudication.  He underwent an L4-5 lumbar decompression without any intraoperative complications.  He was admitted overnight for pain control and monitoring.  On postop day 1 he reported significant improvement of his preoperative leg pain.  He was seen and evaluated by therapy and appropriate for discharge home on postop day 1 with home health services. He was discharged home on POD 1 with prescriptions for oxycodone, senna, and Zanaflex to take as needed.  Consults: None  Significant Diagnostic Studies: none  Treatments: surgery: As above.  Please see separately dictated operative report for further details.  Discharge Exam: Blood pressure (!) 161/79, pulse (!) 58, temperature (!) 97.5 F (36.4 C), resp. rate 14, height 5\' 4"  (1.626 m), weight 84.4 kg, SpO2 98%. CN II-XII grossly intact 5/5 throughout BLE Incision covered with post-op dressing  Disposition: Discharge disposition: 06-Home-Health Care Svc       Discharge Instructions     Incentive spirometry RT   Complete by: As directed       Allergies as of 05/18/2023       Reactions   Albumin (human) Anaphylaxis   Polymyxin B-trimethoprim Swelling   Eye drops made eyes swell   Pseudoephedrine Other (See Comments)   Stomach cramps   Codeine Hives, Itching, Rash   Guaiacol Other (See Comments)   Hallucinations   Statins Other (See Comments)   Muscle cramps   Brimonidine Tartrate Itching, Swelling    Gabapentin Other (See Comments)   Pt unsure of sensitivity   Meloxicam Other (See Comments)   Pt unsure of sensitivity   Peppermint Flavor Other (See Comments)   Severe cramping   Pseudoephedrine-guaifenesin Nausea And Vomiting   Stomach cramps   Rosuvastatin Calcium Other (See Comments)   Muscle aches   Tapentadol Other (See Comments)   Pt unsure of sensitivity   Ciprofloxacin Hives, Itching, Nausea Only, Rash   Moxifloxacin Nausea Only, Other (See Comments)   Headaches, stomach cramps   Rofecoxib Other (See Comments)   Stomach cramping        Medication List     STOP taking these medications    Eliquis 5 MG Tabs tablet Generic drug: apixaban       TAKE these medications    acetaminophen 500 MG tablet Commonly known as: TYLENOL Take 1,000 mg by mouth at bedtime.   aspirin EC 81 MG tablet Take 1 tablet (81 mg total) by mouth daily. Swallow whole. What changed: when to take this   budesonide 3 MG 24 hr capsule Commonly known as: ENTOCORT EC TAKE 2 CAPSULES(6 MG) BY MOUTH DAILY What changed: See the new instructions.   cholecalciferol 25 MCG (1000 UNIT) tablet Commonly known as: VITAMIN D3 Take 10,000 mcg by mouth 4 (four) times a week. S, T, Th, Sat   diazepam 5 MG tablet Commonly known as: VALIUM TAKE 1/2 TO 2 TABLETS(2.5 TO 10 MG) BY MOUTH EVERY 12 HOURS AS NEEDED FOR ANXIETY What changed:  how much to take how to take this when to take this reasons to take this   ferrous sulfate 325 (  65 FE) MG tablet Take 325 mg by mouth 3 (three) times a week. M, W, F   furosemide 20 MG tablet Commonly known as: LASIX TAKE 1 TABLET BY MOUTH DAILY AS  NEEDED What changed: when to take this   latanoprost 0.005 % ophthalmic solution Commonly known as: XALATAN Place 1 drop into the left eye at bedtime.   levalbuterol 45 MCG/ACT inhaler Commonly known as: XOPENEX HFA INHALE 2 PUFFS BY MOUTH EVERY 4 HOURS AS NEEDED FOR WHEEZING What changed: See the new  instructions.   loperamide 2 MG capsule Commonly known as: IMODIUM Take 2 mg by mouth every 6 (six) hours as needed for diarrhea or loose stools.   LUCENTIS IO Inject 1 Dose into the eye every 6 (six) weeks. Bilateral eye by Dr Marvis Repress   magnesium oxide 400 (240 Mg) MG tablet Commonly known as: MAG-OX TAKE 1 TABLET(400 MG) BY MOUTH THREE TIMES DAILY What changed: See the new instructions.   metoprolol tartrate 100 MG tablet Commonly known as: LOPRESSOR TAKE 1 TABLET BY MOUTH TWICE  DAILY   nitroGLYCERIN 0.4 MG SL tablet Commonly known as: NITROSTAT Place 1 tablet (0.4 mg total) under the tongue every 5 (five) minutes as needed for chest pain.   omeprazole 20 MG tablet Commonly known as: PRILOSEC OTC Take 20 mg by mouth every morning.   oxyCODONE 5 MG immediate release tablet Commonly known as: Oxy IR/ROXICODONE Take 1 tablet (5 mg total) by mouth every 4 (four) hours as needed for moderate pain ((score 4 to 6)).   PreserVision AREDS 2 Caps Take 1 tablet by mouth daily.   Repatha SureClick 140 MG/ML Soaj Generic drug: Evolocumab Inject 140 mg into the skin every 14 (fourteen) days.   senna 8.6 MG Tabs tablet Commonly known as: SENOKOT Take 1 tablet (8.6 mg total) by mouth daily as needed for mild constipation.   tamsulosin 0.4 MG Caps capsule Commonly known as: FLOMAX Take 0.4 mg by mouth 2 (two) times daily.   tiZANidine 2 MG tablet Commonly known as: ZANAFLEX Take 1 tablet (2 mg total) by mouth every 6 (six) hours as needed for muscle spasms.         Signed: Susanne Borders 05/18/2023, 10:06 AM

## 2023-05-18 NOTE — Plan of Care (Signed)
  Problem: Education: Goal: Knowledge of General Education information will improve Description: Including pain rating scale, medication(s)/side effects and non-pharmacologic comfort measures Outcome: Progressing   Problem: Health Behavior/Discharge Planning: Goal: Ability to manage health-related needs will improve Outcome: Progressing   Problem: Clinical Measurements: Goal: Will remain free from infection Outcome: Progressing   Problem: Activity: Goal: Risk for activity intolerance will decrease Outcome: Progressing   Problem: Nutrition: Goal: Adequate nutrition will be maintained Outcome: Progressing   Problem: Elimination: Goal: Will not experience complications related to bowel motility Outcome: Progressing Goal: Will not experience complications related to urinary retention Outcome: Progressing   Problem: Pain Managment: Goal: General experience of comfort will improve Outcome: Progressing

## 2023-05-18 NOTE — Evaluation (Addendum)
Physical Therapy Evaluation Patient Details Name: ATIF ZAPPONE MRN: [id number] DOB: [birth year] Today's Date: 05/18/2023  History of Present Illness  Pt is a 77 y/o presenting s/p L4/5 decompression. PMH includes COPD, former smoker, HTN, CAD, PVD, CHF, a-fib, CVA, renal disease, prostate cancer, osteoporosis, chronic LBP, and lumbar stenosis with neurogenic claudication.  Clinical Impression   Pt presents laying in bed with family in room, pain currently 4/10 in low back. He currently lives with his wife in a 2 story home (bedroom/bathroom access on 1st floor) with 2 small steps to enter and a rail on the R. PTA he was modi for ADLs and used a cane for mobility but wife would occasionally provide supervision assist if pain was worse that day.   PT LE sensation screening WFL and LE strength screen revealed generalized weakness in B/L LE. Pt able to perform supine>sit with [facility] elevated with supervision for safety. Pt tolerated sit<>stand transfers and ambulating ~34ft with CGA and RW/cane. PT noted no change in gait mechanics with transition from RW> cane. Pt overall exhibited decreased speed, step through pattern, and able to maintain upright posturing/balance. Pt ascended/descended 4+4 stairs with L rail/ cane with CGA and step to pattern. Pt would benefit from continued skilled therapy to maximize functional abilities.       If plan is discharge home, recommend the following: A little help with walking and/or transfers;A little help with bathing/dressing/bathroom;Assistance with cooking/housework;Assist for transportation;Help with stairs or ramp for entrance;Direct supervision/assist for medications management   Can travel by private vehicle        Equipment Recommendations None recommended by PT  Recommendations for Other Services       Functional Status Assessment Patient has had a recent decline in their functional status and demonstrates the ability to make significant improvements  in function in a reasonable and predictable amount of time.     Precautions / Restrictions Restrictions Weight Bearing Restrictions: No      Mobility  Bed Mobility Overal bed mobility: Needs Assistance Bed Mobility: Supine to Sit     Supine to sit: Supervision, HOB elevated          Transfers Overall transfer level: Needs assistance Equipment used: Rolling walker (2 wheels) Transfers: Sit to/from Stand Sit to Stand: Contact guard assist           General transfer comment: CGA for safety    Ambulation/Gait Ambulation/Gait assistance: Contact guard assist Gait Distance (Feet): 360 Feet Assistive device: Straight cane, Rolling walker (2 wheels) Gait Pattern/deviations: WFL(Within Functional Limits) Gait velocity: decreased        Stairs Stairs: Yes Stairs assistance: Contact guard assist Stair Management: Two rails, One rail Left, With cane, Step to pattern Number of Stairs: 8 (4+4) General stair comments: ascend/descend 4 with L rail then 4 with L rail and cane  Wheelchair Mobility     Tilt Bed    Modified Rankin (Stroke Patients Only)       Balance Overall balance assessment: Needs assistance Sitting-balance support: No upper extremity supported, Feet supported Sitting balance-Leahy Scale: Good       Standing balance-Leahy Scale: Good                               Pertinent Vitals/Pain Pain Assessment Pain Assessment: 0-10 Pain Score: 4  Pain Location: low back Pain Descriptors / Indicators: Discomfort, Constant Pain Intervention(s): Monitored during session    Home Living Family/patient expects  to be discharged to:: Private residence Living Arrangements: Spouse/significant other Available Help at Discharge: Family Type of Home: House Home Access: Stairs to enter Entrance Stairs-Rails: Right Entrance Stairs-Number of Steps: 2 Alternate Level Stairs-Number of Steps: 12 Home Layout: Two level Home Equipment: Cane -  single Librarian, academic (2 wheels);BSC/3in1;Grab bars - tub/shower      Prior Function Prior Level of Function : Independent/Modified Independent             Mobility Comments: Uses cane for ambulation, wife occasionally will be close by for safety if pain was worse that day       Extremity/Trunk Assessment   Upper Extremity Assessment Upper Extremity Assessment: Overall WFL for tasks assessed    Lower Extremity Assessment Lower Extremity Assessment: Generalized weakness;RLE deficits/detail;LLE deficits/detail RLE Deficits / Details: MMT R knee flex 3/5 (limited by pain) RLE Sensation: WNL LLE Deficits / Details: knee flex 3+/5, limited by pain LLE Sensation: WNL       Communication   Communication Communication: No apparent difficulties  Cognition Arousal: Alert Behavior During Therapy: WFL for tasks assessed/performed Overall Cognitive Status: Within Functional Limits for tasks assessed                                          General Comments      Exercises     Assessment/Plan    PT Assessment Patient needs continued PT services  PT Problem List Decreased strength;Decreased range of motion;Decreased activity tolerance;Decreased mobility;Decreased balance;Decreased knowledge of use of DME;Decreased safety awareness;Decreased knowledge of precautions       PT Treatment Interventions DME instruction;Gait training;Stair training;Functional mobility training;Therapeutic activities;Therapeutic exercise;Balance training;Neuromuscular re-education;Patient/family education    PT Goals (Current goals can be found in the Care Plan section)  Acute Rehab PT Goals Patient Stated Goal: return home PT Goal Formulation: With patient Time For Goal Achievement: 06/01/23 Potential to Achieve Goals: Good    Frequency 7X/week     Co-evaluation               AM-PAC PT "6 Clicks" Mobility  Outcome Measure Help needed turning from your back to  your side while in a flat bed without using bedrails?: None Help needed moving from lying on your back to sitting on the side of a flat bed without using bedrails?: A Little Help needed moving to and from a bed to a chair (including a wheelchair)?: A Little Help needed standing up from a chair using your arms (e.g., wheelchair or bedside chair)?: A Little Help needed to walk in hospital room?: A Little Help needed climbing 3-5 steps with a railing? : A Little 6 Click Score: 19    End of Session Equipment Utilized During Treatment: Gait belt Activity Tolerance: Patient tolerated treatment well Patient left: in bed;with call bell/phone within reach;with nursing/sitter in room;with family/visitor present   PT Visit Diagnosis: Other abnormalities of gait and mobility (R26.89);Muscle weakness (generalized) (M62.81)    Time: 7106-2694 PT Time Calculation (min) (ACUTE ONLY): 31 min   Charges:   PT Evaluation $PT Eval Low Complexity: 1 Low PT Treatments $Gait Training: 23-37 mins PT General Charges $$ ACUTE PT VISIT: 1 Visit          , PT, SPT 11:34 AM,05/18/23

## 2023-05-18 NOTE — Progress Notes (Signed)
   Neurosurgery Progress Note  History: Ryan [name] is a 77 y.o s/p L4-5 decompression   POD1: has expected back pain this morning but reports resolution of his preoperative leg pain.  Physical Exam: Vitals:   05/18/23 0009 05/18/23 0415  BP: (!) 145/57 (!) 151/66  Pulse: (!) 53 64  Resp: 20 18  Temp: (!) 97.5 F (36.4 C) 97.8 F (36.6 C)  SpO2: 93% 93%    AA Ox3 CNI  Strength:5/5 throughout bilateral lower extremities Incision covered with mildly blood-tinged dressing  Data:  Other tests/results: None  Assessment/Plan:  Ryan [name] is a 77 y.o presenting with lumbar stenosis and neurogenic claudication s/p L4-5 lumbar decompression.  He reports improvement in his preoperative leg pain.  - mobilize - pain control - DVT prophylaxis - PTOT; dispo planning underway.  Manning Charity PA-C Department of Neurosurgery

## 2023-05-18 NOTE — TOC Progression Note (Signed)
Transition of Care [facility]) - Progression Note    Patient Details  Name: Ryan [name] MRN: [id number] Date of Birth: Oct 07, 1945  Transition of Care [facility] CM/SW Contact  Marlowe Sax, RN Phone Number: 05/18/2023, 10:17 AM  Clinical Narrative:     Spoke with the patient He has had HH for a previous surgery and would like to use the same company, [facility] is set up for the patient He currently already has the DME in place at home and does not need additional He is aware that he should no use his home gym until okay by the doctor  Expected Discharge Plan: Home w Home Health Services Barriers to Discharge: No Barriers Identified  Expected Discharge Plan and Services   Discharge Planning Services: CM Consult     Expected Discharge Date: 05/18/23               DME Arranged: N/A DME Agency: NA       HH Arranged: PT, OT HH Agency: [facility] Date HH Agency Contacted: 05/18/23 Time HH Agency Contacted: 1017 Representative spoke with at [facility] Agency: Morrie Sheldon   Social Determinants of Health (SDOH) Interventions SDOH Screenings   Food Insecurity: No Food Insecurity (05/17/2023)  Housing: Low Risk  (05/17/2023)  Transportation Needs: No Transportation Needs (05/17/2023)  Utilities: Not At Risk (05/17/2023)  Alcohol Screen: Low Risk  (02/02/2023)  Depression (PHQ2-9): Low Risk  (03/23/2023)  Financial Resource Strain: Low Risk  (02/02/2023)  Physical Activity: Insufficiently Active (02/02/2023)  Social Connections: Moderately Integrated (02/02/2023)  Stress: No Stress Concern Present (02/02/2023)  Tobacco Use: Medium Risk (05/17/2023)    Readmission Risk Interventions    01/12/2022    8:35 AM 08/01/2021   10:18 AM  Readmission Risk Prevention Plan  Transportation Screening Complete Complete  PCP or Specialist Appt within 3-5 Days Complete Complete  HRI or Home Care Consult Complete Complete  Social Work Consult for Recovery Care Planning/Counseling  Complete   Palliative Care Screening Not Applicable Not Applicable  Medication Review Oceanographer)  Complete

## 2023-05-18 NOTE — Progress Notes (Signed)
Discharge instructions reviewed with patient. He verbalized understanding of instructions. Dressing changed prior to discharge.

## 2023-05-19 ENCOUNTER — Telehealth: Payer: Self-pay | Admitting: Orthopedic Surgery

## 2023-05-19 NOTE — Anesthesia Postprocedure Evaluation (Signed)
Anesthesia Post Note  Patient: Ryan [name]  Procedure(s) Performed: L4-5 DECOMPRESSION (Spine Lumbar)  Patient location during evaluation: PACU Anesthesia Type: General Level of consciousness: awake and alert Pain management: pain level controlled Vital Signs Assessment: post-procedure vital signs reviewed and stable Respiratory status: spontaneous breathing, nonlabored ventilation, respiratory function stable and patient connected to nasal cannula oxygen Cardiovascular status: blood pressure returned to baseline and stable Postop Assessment: no apparent nausea or vomiting Anesthetic complications: no   No notable events documented.   Last Vitals:  Vitals:   05/18/23 0415 05/18/23 0814  BP: (!) 151/66 (!) 161/79  Pulse: 64 (!) 58  Resp: 18 14  Temp: 36.6 C (!) 36.4 C  SpO2: 93% 98%    Last Pain:  Vitals:   05/18/23 0807  TempSrc:   PainSc: 0-No pain                 Yevette Edwards

## 2023-05-19 NOTE — Telephone Encounter (Signed)
Questioning about stopping Eliquis, he wants to know when he is supposed to start back.  Told him that according to the surgical clearance signed by Dr. Lubertha Basque office, should start back Eliquis 14 days post op. That would have him starting back 8/27.

## 2023-05-20 DIAGNOSIS — I5032 Chronic diastolic (congestive) heart failure: Secondary | ICD-10-CM | POA: Diagnosis not present

## 2023-05-20 DIAGNOSIS — I480 Paroxysmal atrial fibrillation: Secondary | ICD-10-CM | POA: Diagnosis not present

## 2023-05-20 DIAGNOSIS — H903 Sensorineural hearing loss, bilateral: Secondary | ICD-10-CM | POA: Diagnosis not present

## 2023-05-20 DIAGNOSIS — I700 Atherosclerosis of aorta: Secondary | ICD-10-CM | POA: Diagnosis not present

## 2023-05-20 DIAGNOSIS — M5030 Other cervical disc degeneration, unspecified cervical region: Secondary | ICD-10-CM | POA: Diagnosis not present

## 2023-05-20 DIAGNOSIS — H3530 Unspecified macular degeneration: Secondary | ICD-10-CM | POA: Diagnosis not present

## 2023-05-20 DIAGNOSIS — N138 Other obstructive and reflux uropathy: Secondary | ICD-10-CM | POA: Diagnosis not present

## 2023-05-20 DIAGNOSIS — R338 Other retention of urine: Secondary | ICD-10-CM | POA: Diagnosis not present

## 2023-05-20 DIAGNOSIS — H547 Unspecified visual loss: Secondary | ICD-10-CM | POA: Diagnosis not present

## 2023-05-20 DIAGNOSIS — H409 Unspecified glaucoma: Secondary | ICD-10-CM | POA: Diagnosis not present

## 2023-05-20 DIAGNOSIS — Z87891 Personal history of nicotine dependence: Secondary | ICD-10-CM | POA: Diagnosis not present

## 2023-05-20 DIAGNOSIS — I6523 Occlusion and stenosis of bilateral carotid arteries: Secondary | ICD-10-CM | POA: Diagnosis not present

## 2023-05-20 DIAGNOSIS — J439 Emphysema, unspecified: Secondary | ICD-10-CM | POA: Diagnosis not present

## 2023-05-20 DIAGNOSIS — D631 Anemia in chronic kidney disease: Secondary | ICD-10-CM | POA: Diagnosis not present

## 2023-05-20 DIAGNOSIS — M48062 Spinal stenosis, lumbar region with neurogenic claudication: Secondary | ICD-10-CM | POA: Diagnosis not present

## 2023-05-20 DIAGNOSIS — R32 Unspecified urinary incontinence: Secondary | ICD-10-CM | POA: Diagnosis not present

## 2023-05-20 DIAGNOSIS — F419 Anxiety disorder, unspecified: Secondary | ICD-10-CM | POA: Diagnosis not present

## 2023-05-20 DIAGNOSIS — M810 Age-related osteoporosis without current pathological fracture: Secondary | ICD-10-CM | POA: Diagnosis not present

## 2023-05-20 DIAGNOSIS — Z4789 Encounter for other orthopedic aftercare: Secondary | ICD-10-CM | POA: Diagnosis not present

## 2023-05-20 DIAGNOSIS — I2510 Atherosclerotic heart disease of native coronary artery without angina pectoris: Secondary | ICD-10-CM | POA: Diagnosis not present

## 2023-05-20 DIAGNOSIS — N183 Chronic kidney disease, stage 3 unspecified: Secondary | ICD-10-CM | POA: Diagnosis not present

## 2023-05-20 DIAGNOSIS — N401 Enlarged prostate with lower urinary tract symptoms: Secondary | ICD-10-CM | POA: Diagnosis not present

## 2023-05-20 DIAGNOSIS — K219 Gastro-esophageal reflux disease without esophagitis: Secondary | ICD-10-CM | POA: Diagnosis not present

## 2023-05-20 DIAGNOSIS — I739 Peripheral vascular disease, unspecified: Secondary | ICD-10-CM | POA: Diagnosis not present

## 2023-05-20 DIAGNOSIS — I130 Hypertensive heart and chronic kidney disease with heart failure and stage 1 through stage 4 chronic kidney disease, or unspecified chronic kidney disease: Secondary | ICD-10-CM | POA: Diagnosis not present

## 2023-05-23 ENCOUNTER — Other Ambulatory Visit: Payer: Self-pay | Admitting: Family Medicine

## 2023-05-23 DIAGNOSIS — R790 Abnormal level of blood mineral: Secondary | ICD-10-CM

## 2023-05-24 DIAGNOSIS — D631 Anemia in chronic kidney disease: Secondary | ICD-10-CM | POA: Diagnosis not present

## 2023-05-24 DIAGNOSIS — Z4789 Encounter for other orthopedic aftercare: Secondary | ICD-10-CM | POA: Diagnosis not present

## 2023-05-24 DIAGNOSIS — N183 Chronic kidney disease, stage 3 unspecified: Secondary | ICD-10-CM | POA: Diagnosis not present

## 2023-05-24 DIAGNOSIS — I5032 Chronic diastolic (congestive) heart failure: Secondary | ICD-10-CM | POA: Diagnosis not present

## 2023-05-24 DIAGNOSIS — I130 Hypertensive heart and chronic kidney disease with heart failure and stage 1 through stage 4 chronic kidney disease, or unspecified chronic kidney disease: Secondary | ICD-10-CM | POA: Diagnosis not present

## 2023-05-24 DIAGNOSIS — M48062 Spinal stenosis, lumbar region with neurogenic claudication: Secondary | ICD-10-CM | POA: Diagnosis not present

## 2023-05-26 NOTE — Assessment & Plan Note (Signed)
hgba1c acceptable, minimize simple carbs. Increase exercise as tolerated.  

## 2023-05-26 NOTE — Assessment & Plan Note (Signed)
No recent exacerbation. Is following with cardiology for management

## 2023-05-26 NOTE — Assessment & Plan Note (Signed)
Has undergone decompression with neurosurgery,

## 2023-05-26 NOTE — Assessment & Plan Note (Signed)
Supplement and monitor 

## 2023-05-26 NOTE — Assessment & Plan Note (Signed)
Well controlled, no changes to meds. Encouraged heart healthy diet such as the DASH diet and exercise as tolerated.  °

## 2023-05-26 NOTE — Assessment & Plan Note (Signed)
Rate controlled and following with cardiology

## 2023-05-26 NOTE — Assessment & Plan Note (Signed)
Tolerating Repatha °

## 2023-05-26 NOTE — Assessment & Plan Note (Signed)
Encourage heart healthy diet such as MIND or DASH diet, increase exercise, avoid trans fats, simple carbohydrates and processed foods, consider a krill or fish or flaxseed oil cap daily. Does not tolerate statins, is tolerating Repatha 

## 2023-05-27 ENCOUNTER — Other Ambulatory Visit (HOSPITAL_BASED_OUTPATIENT_CLINIC_OR_DEPARTMENT_OTHER): Payer: Self-pay

## 2023-05-27 ENCOUNTER — Ambulatory Visit (INDEPENDENT_AMBULATORY_CARE_PROVIDER_SITE_OTHER): Payer: Medicare Other | Admitting: Family Medicine

## 2023-05-27 VITALS — BP 130/68 | HR 76 | Temp 97.5°F | Resp 16 | Ht 64.0 in | Wt 187.8 lb

## 2023-05-27 DIAGNOSIS — I10 Essential (primary) hypertension: Secondary | ICD-10-CM | POA: Diagnosis not present

## 2023-05-27 DIAGNOSIS — D649 Anemia, unspecified: Secondary | ICD-10-CM | POA: Diagnosis not present

## 2023-05-27 DIAGNOSIS — Z789 Other specified health status: Secondary | ICD-10-CM | POA: Diagnosis not present

## 2023-05-27 DIAGNOSIS — E782 Mixed hyperlipidemia: Secondary | ICD-10-CM | POA: Diagnosis not present

## 2023-05-27 DIAGNOSIS — I4819 Other persistent atrial fibrillation: Secondary | ICD-10-CM | POA: Diagnosis not present

## 2023-05-27 DIAGNOSIS — E559 Vitamin D deficiency, unspecified: Secondary | ICD-10-CM

## 2023-05-27 DIAGNOSIS — I5032 Chronic diastolic (congestive) heart failure: Secondary | ICD-10-CM

## 2023-05-27 DIAGNOSIS — D509 Iron deficiency anemia, unspecified: Secondary | ICD-10-CM | POA: Diagnosis not present

## 2023-05-27 DIAGNOSIS — K52832 Lymphocytic colitis: Secondary | ICD-10-CM | POA: Diagnosis not present

## 2023-05-27 DIAGNOSIS — M48062 Spinal stenosis, lumbar region with neurogenic claudication: Secondary | ICD-10-CM

## 2023-05-27 DIAGNOSIS — I5033 Acute on chronic diastolic (congestive) heart failure: Secondary | ICD-10-CM

## 2023-05-27 DIAGNOSIS — R739 Hyperglycemia, unspecified: Secondary | ICD-10-CM | POA: Diagnosis not present

## 2023-05-27 NOTE — Assessment & Plan Note (Signed)
On budesonide?

## 2023-05-27 NOTE — Assessment & Plan Note (Signed)
No recent exacerbation 

## 2023-05-27 NOTE — Patient Instructions (Signed)

## 2023-05-27 NOTE — Assessment & Plan Note (Signed)
Increase leafy greens, consider increased lean red meat and using cast iron cookware. Continue to monitor, report any concerns taking iron 3 x a week but wants to decrease to twice a week due to skin tags flaring after starting

## 2023-05-28 ENCOUNTER — Encounter: Payer: Self-pay | Admitting: Family Medicine

## 2023-05-28 LAB — CBC WITH DIFFERENTIAL/PLATELET
Basophils Absolute: 0.1 10*3/uL (ref 0.0–0.1)
Basophils Relative: 1.3 % (ref 0.0–3.0)
Eosinophils Absolute: 0.2 10*3/uL (ref 0.0–0.7)
Eosinophils Relative: 1.7 % (ref 0.0–5.0)
HCT: 38.3 % — ABNORMAL LOW (ref 39.0–52.0)
Hemoglobin: 12.8 g/dL — ABNORMAL LOW (ref 13.0–17.0)
Lymphocytes Relative: 14.8 % (ref 12.0–46.0)
Lymphs Abs: 1.6 10*3/uL (ref 0.7–4.0)
MCHC: 33.5 g/dL (ref 30.0–36.0)
MCV: 91.7 fl (ref 78.0–100.0)
Monocytes Absolute: 0.8 10*3/uL (ref 0.1–1.0)
Monocytes Relative: 7.9 % (ref 3.0–12.0)
Neutro Abs: 7.9 10*3/uL — ABNORMAL HIGH (ref 1.4–7.7)
Neutrophils Relative %: 74.3 % (ref 43.0–77.0)
Platelets: 250 10*3/uL (ref 150.0–400.0)
RBC: 4.17 Mil/uL — ABNORMAL LOW (ref 4.22–5.81)
RDW: 14.6 % (ref 11.5–15.5)
WBC: 10.6 10*3/uL — ABNORMAL HIGH (ref 4.0–10.5)

## 2023-05-28 NOTE — Progress Notes (Signed)
Subjective:    Patient ID: Ryan [name], male    DOB: 06/19/1946, 77 y.o.   MRN: [id number]  Chief Complaint  Patient presents with   Follow-up    Follow up    HPI Discussed the use of AI scribe software for clinical note transcription with the patient, who gave verbal consent to proceed.  History of Present Illness  Patient is a 77 yo male in today for follow up on chronic medical concerns. No recent febrile illness or hospitalizations. Denies CP/palp/SOB/HA/congestion/fevers/GI or GU c/o. Taking meds as prescribed  The patient is a male with a history of mole cancer, back issues, and urinary problems. He recently underwent Mohs surgery for a mole cancer, which was successfully removed. The patient reported that the mole had grown rapidly, but he sought treatment quickly. He also had back surgery, which has significantly improved his quality of life. He is now able to sleep for seven hours a night without needing to urinate, which was not the case prior to the surgery. However, he has been experiencing some urinary issues, including nocturia and urinary urgency. The patient also has a history of anemia, which is being managed with iron supplements. He has noticed some skin tags developing since increasing his iron intake, but it is unclear if this is related. The patient is also managing his lymphocytic colitis with loperamide and budesonide.        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; Gleason 5+5; PSA 1.8 --> Tx'd with XRT (12/10/21 - 02/18/22) + LT-ADT; XRT course truncated due to severe radiation colitis   Allergy    Anemia    Anxiety    a.) on BZO PRN (diazepam)   Aortic atherosclerosis (HCC)    Bilateral carotid artery disease (HCC) 03/24/2021   a.) carotid doppler 03/24/2021: 1-39% BICA   BPH with urinary  obstruction    CAD (coronary artery disease) 03/26/2011   a.) cCTA 03/26/2011: Ca2+ 505 (pRCA + LM); b.) LHC 04/01/2011: 40-50% oLM, 60-70% m-dLAD, 30% mD2, 60% pRCA - med mgmt; c.) LHC 10/17/2022L 90% oLM, 60% o-pLCx, 95% pRCA, 70% dRCA, 70% RPDA --> CVTS consult; d.) s/p 4v CABG 07/22/2021   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   Chronic diastolic CHF (congestive heart failure) (HCC) 01/14/2017   a.) TTE 01/14/2017: TTE 01/14/2017: EF 55-60%, sev LA dil, triv MR, G1DD; b.) TTE 03/23/2018: EF 55-60%, mod LVH, sev LA dil, mod-sev RA dil; c.) TTE 04/17/2020: EF 60-65%, mod asym ant seg LVH, mild LA dil, mild MR, G1DD; d.) TTE 03/25/2021: EF 50-55%, AoV sclerosis, triv MR   CKD (chronic kidney disease), stage III (HCC)    COPD (chronic obstructive pulmonary disease) (HCC)    DDD (degenerative disc disease), cervical    a.) s/p ACDF C5-C7 06/08/2002   DOE (dyspnea on exertion)    Eczema    Emphysema of lung (HCC)    Family history of adverse reaction to anesthesia    a.) PONV and postoperative delirium/agitation in 1st degree relatives (sisters)   GERD (gastroesophageal reflux disease)    Glaucoma, left eye    Heart murmur    Hemorrhoids    History of adenomatous polyp of colon    History of bilateral cataract extraction 2021   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 Barron Alvine--- dx by biopsy  01-28-2021   Macular degeneration of both eyes    followed by dr c. Gwendalyn Ege--- bilateral eye injection every 6 wks   NSVT (nonsustained ventricular tachycardia) (HCC) 03/17/2022   a.) Zio patch 03/17/2022: 3 runs with fastest lasting 10 beats at rate of 190 bpm and longest lasting 16 beats at rate of 121 bpm   On apixaban therapy    Osteoporosis 05/31/2016   PAF (paroxysmal atrial fibrillation) (HCC) 03/2012   a.) CHA2DS2VASc = 7 (age x2, CHF, HTN, TIA x2, vascular disease history);  b.) s/p DCCV 04/25/2018 (150J x 1 --> SB); c.) rate/rhythm maintained on oral metoprolol tartrate; chronically anticoagulated with apixaban   Postoperative ileus (HCC) 07/2021   a.) following CABG procedure   RBBB (right bundle branch block)    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   a.) LIMA-LAD, SVG-OM1, sequential SVG-acute marginal-PDA   SNHL (sensorineural hearing loss)    Statin intolerance    Vision loss of left eye    macular degeneration   Vitamin D deficiency     Past Surgical History:  Procedure Laterality Date   ANTERIOR CERVICAL DECOMP/DISCECTOMY FUSION  06/08/2002   APPENDECTOMY  1953   CARDIAC CATHETERIZATION Left 04/01/2011   CARDIOVERSION N/A 04/25/2018   Procedure: CARDIOVERSION;  Surgeon: Chilton Si, MD;  Location: [facility] ENDOSCOPY;  Service: Cardiovascular;  Laterality: N/A;   CATARACT EXTRACTION W/ INTRAOCULAR LENS IMPLANT Bilateral 2021   COLONOSCOPY WITH ESOPHAGOGASTRODUODENOSCOPY (EGD)  01/28/2021   by WNUUVO   CORONARY ARTERY BYPASS GRAFT N/A 07/22/2021   Procedure: 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, ON PUMP, USING LEFT INTERNAL MAMMARY ARTERY AND ENDOSCOPICALLY HARVESTED RIGHT GREATER SAPHENOUS VEIN;  Surgeon: Loreli Slot, MD;  Location: [facility] OR;  Service: Open Heart Surgery;  Laterality: N/A;   ENDOVEIN HARVEST OF GREATER SAPHENOUS VEIN  07/22/2021   Procedure: ENDOVEIN HARVEST OF GREATER SAPHENOUS VEIN;  Surgeon: Loreli Slot, MD;  Location: [facility] OR;  Service:  Open Heart Surgery;;   EXTRACORPOREAL SHOCK WAVE LITHOTRIPSY     x2  1990s   EYE SURGERY     FOOT SURGERY Right 1970   calcification removed from top of foot   GOLD SEED IMPLANT N/A 11/28/2021   Procedure: GOLD SEED IMPLANT;  Surgeon: Jerilee Field, MD;  Location: [facility];  Service: Urology;  Laterality: N/A;   KNEE ARTHROSCOPY Right 1991   LAPAROSCOPIC CHOLECYSTECTOMY  2000   LEFT HEART CATH AND CORONARY ANGIOGRAPHY N/A 07/21/2021   Procedure: LEFT HEART CATH AND CORONARY ANGIOGRAPHY;  Surgeon: Runell Gess, MD;  Location: [facility] INVASIVE CV LAB;  Service: Cardiovascular;  Laterality: N/A;   LUMBAR LAMINECTOMY/DECOMPRESSION MICRODISCECTOMY N/A 05/17/2023   Procedure: L4-5 DECOMPRESSION;  Surgeon: Venetia Night, MD;  Location: [facility] ORS;  Service: Neurosurgery;  Laterality: N/A;  SPACE OAR INSTILLATION N/A 11/28/2021   Procedure: SPACE OAR INSTILLATION;  Surgeon: Jerilee Field, MD;  Location: [facility];  Service: Urology;  Laterality: N/A;   TEE WITHOUT CARDIOVERSION N/A 07/22/2021   Procedure: TRANSESOPHAGEAL ECHOCARDIOGRAM (TEE);  Surgeon: Loreli Slot, MD;  Location: [facility];  Service: Open Heart Surgery;  Laterality: N/A;   TOTAL KNEE ARTHROPLASTY Right 08/14/2009   @[facility]    TRANSURETHRAL RESECTION OF PROSTATE N/A 07/17/2021   Procedure: TRANSURETHRAL RESECTION OF THE PROSTATE (TURP);  Surgeon: Marcine Matar, MD;  Location: [facility];  Service: Urology;  Laterality: N/A;  1 HR    Family History  Problem Relation Age of Onset   Heart disease Mother    Diabetes Mother    Cirrhosis Mother    Emphysema Mother        never smoked but 2nd hand through her spouse   Hypertension Mother    Macular degeneration Mother    Heart disease Father    Cancer Father        prostate   Hyperlipidemia Father    Hypertension Father    Varicose Veins Father    Heart attack Father    Peripheral vascular disease Father     Heart disease Sister    Arthritis Sister    Hyperlipidemia Sister    Obesity Sister    Macular degeneration Sister    Heart disease Brother        5 stents   Hyperlipidemia Brother    Macular degeneration Maternal Grandfather    Cirrhosis Sister    Obesity Sister    Arthritis Sister    Heart disease Sister    Obesity Sister    Liver disease Other    Prostate cancer Other    Coronary artery disease Other    Colon cancer Neg Hx    Esophageal cancer Neg Hx    Rectal cancer Neg Hx    Stomach cancer Neg Hx     Social History   Socioeconomic History   Marital status: Married    Spouse name: [name]   Number of children: 0   Years of education: Not on file   Highest education level: Associate degree: academic program  Occupational History   Occupation: retired    Associate Professor: RETIRED  Tobacco Use   Smoking status: Former    Current packs/day: 0.00    Average packs/day: 0.5 packs/day for 50.0 years (25.0 ttl pk-yrs)    Types: Cigarettes    Start date: 05/02/1959    Quit date: 05/01/2009    Years since quitting: 14.0   Smokeless tobacco: Former    Types: Chew    Quit date: 1995   Tobacco comments:    Former smoker 01/27/22  Vaping Use   Vaping status: Never Used  Substance and Sexual Activity   Alcohol use: Yes    Alcohol/week: 2.0 standard drinks of alcohol    Types: 2 Cans of beer per week    Comment: 1-2 glasses wine/beer daily   Drug use: Never   Sexual activity: Yes    Comment: lives with wife, no dietary restrictions  Other Topics Concern   Not on file  Social History Narrative   Lives with wife   Social Determinants of Health   Financial Resource Strain: Low Risk  (02/02/2023)   Overall Financial Resource Strain (CARDIA)    Difficulty of Paying Living Expenses: Not hard at all  Food Insecurity: No Food Insecurity (05/17/2023)   Hunger Vital Sign  Worried About Programme researcher, broadcasting/film/video in the Last Year: Never true    Ran Out of Food in the Last Year: Never true   Transportation Needs: No Transportation Needs (05/17/2023)   PRAPARE - Administrator, Civil Service (Medical): No    Lack of Transportation (Non-Medical): No  Physical Activity: Insufficiently Active (02/02/2023)   Exercise Vital Sign    Days of Exercise per Week: 2 days    Minutes of Exercise per Session: 30 min  Stress: No Stress Concern Present (02/02/2023)   [employer] of Occupational Health - Occupational Stress Questionnaire    Feeling of Stress : Only a little  Social Connections: Moderately Integrated (02/02/2023)   Social Connection and Isolation Panel [NHANES]    Frequency of Communication with Friends and Family: Twice a week    Frequency of Social Gatherings with Friends and Family: Twice a week    Attends Religious Services: Never    Database administrator or Organizations: Yes    Attends Banker Meetings: Never    Marital Status: Married  Catering manager Violence: Not At Risk (05/17/2023)   Humiliation, Afraid, Rape, and Kick questionnaire    Fear of Current or Ex-Partner: No    Emotionally Abused: No    Physically Abused: No    Sexually Abused: No    Outpatient Medications Prior to Visit  Medication Sig Dispense Refill   acetaminophen (TYLENOL) 500 MG tablet Take 1,000 mg by mouth at bedtime.     aspirin EC 81 MG EC tablet Take 1 tablet (81 mg total) by mouth daily. Swallow whole. (Patient taking differently: Take 81 mg by mouth every morning. Swallow whole.) 30 tablet 11   budesonide (ENTOCORT EC) 3 MG 24 hr capsule TAKE 2 CAPSULES(6 MG) BY MOUTH DAILY (Patient taking differently: 6 mg every morning.) 180 capsule 1   cholecalciferol (VITAMIN D3) 25 MCG (1000 UNIT) tablet Take 10,000 Units by mouth 4 (four) times a week. S, T, Th, Sat     diazepam (VALIUM) 5 MG tablet TAKE 1/2 TO 2 TABLETS(2.5 TO 10 MG) BY MOUTH EVERY 12 HOURS AS NEEDED FOR ANXIETY (Patient taking differently: Take 0.5-2 tablets by mouth every 12 (twelve) hours as needed.  TAKE 1/2 TO 2 TABLETS(2.5 TO 10 MG) BY MOUTH EVERY 12 HOURS AS NEEDED FOR ANXIETY) 30 tablet 1   Evolocumab (REPATHA SURECLICK) 140 MG/ML SOAJ Inject 140 mg into the skin every 14 (fourteen) days. 6 mL 3   ferrous sulfate 325 (65 FE) MG tablet Take 325 mg by mouth 3 (three) times a week. M, W, F     furosemide (LASIX) 20 MG tablet TAKE 1 TABLET BY MOUTH DAILY AS  NEEDED (Patient taking differently: Take 20 mg by mouth as needed.) 90 tablet 3   latanoprost (XALATAN) 0.005 % ophthalmic solution Place 1 drop into the left eye at bedtime.      levalbuterol (XOPENEX HFA) 45 MCG/ACT inhaler INHALE 2 PUFFS BY MOUTH EVERY 4 HOURS AS NEEDED FOR WHEEZING (Patient taking differently: 2 puffs every 4 (four) hours as needed.) 15 g 0   loperamide (IMODIUM) 2 MG capsule Take 2 mg by mouth every 6 (six) hours as needed for diarrhea or loose stools.     magnesium oxide (MAG-OX) 400 (240 Mg) MG tablet TAKE 1 TABLET(400 MG) BY MOUTH THREE TIMES DAILY 270 tablet 1   metoprolol tartrate (LOPRESSOR) 100 MG tablet TAKE 1 TABLET BY MOUTH TWICE  DAILY 180 tablet 3  Multiple Vitamins-Minerals (PRESERVISION AREDS 2) CAPS Take 1 tablet by mouth daily.     nitroGLYCERIN (NITROSTAT) 0.4 MG SL tablet Place 1 tablet (0.4 mg total) under the tongue every 5 (five) minutes as needed for chest pain. 30 tablet 0   omeprazole (PRILOSEC OTC) 20 MG tablet Take 20 mg by mouth every morning.     oxyCODONE (OXY IR/ROXICODONE) 5 MG immediate release tablet Take 1 tablet (5 mg total) by mouth every 4 (four) hours as needed for moderate pain ((score 4 to 6)). 30 tablet 0   Ranibizumab (LUCENTIS IO) Inject 1 Dose into the eye every 6 (six) weeks. Bilateral eye by Dr Marvis Repress     senna (SENOKOT) 8.6 MG TABS tablet Take 1 tablet (8.6 mg total) by mouth daily as needed for mild constipation. 30 tablet 0   tamsulosin (FLOMAX) 0.4 MG CAPS capsule Take 0.4 mg by mouth 2 (two) times daily.     tiZANidine (ZANAFLEX) 2 MG tablet Take 1 tablet (2 mg  total) by mouth every 6 (six) hours as needed for muscle spasms. 120 tablet 0   No facility-administered medications prior to visit.    Allergies  Allergen Reactions   Albumin (Human) Anaphylaxis   Polymyxin B-Trimethoprim Swelling    Eye drops made eyes swell   Pseudoephedrine Other (See Comments)    Stomach cramps   Codeine Hives, Itching and Rash   Guaiacol Other (See Comments)    Hallucinations   Statins Other (See Comments)    Muscle cramps    Brimonidine Tartrate Itching and Swelling   Gabapentin Other (See Comments)    Pt unsure of sensitivity   Meloxicam Other (See Comments)    Pt unsure of sensitivity   Peppermint Flavor Other (See Comments)    Severe cramping   Pseudoephedrine-Guaifenesin Nausea And Vomiting    Stomach cramps   Rosuvastatin Calcium Other (See Comments)    Muscle aches   Tapentadol Other (See Comments)    Pt unsure of sensitivity   Ciprofloxacin Hives, Itching, Nausea Only and Rash   Moxifloxacin Nausea Only and Other (See Comments)    Headaches, stomach cramps    Rofecoxib Other (See Comments)    Stomach cramping    Review of Systems  Constitutional:  Negative for fever and malaise/fatigue.  HENT:  Negative for congestion.   Eyes:  Negative for blurred vision.  Respiratory:  Negative for shortness of breath.   Cardiovascular:  Negative for chest pain, palpitations and leg swelling.  Gastrointestinal:  Negative for abdominal pain, blood in stool and nausea.  Genitourinary:  Negative for dysuria and frequency.  Musculoskeletal:  Positive for back pain. Negative for falls.  Skin:  Negative for rash.  Neurological:  Negative for dizziness, loss of consciousness and headaches.  Endo/Heme/Allergies:  Negative for environmental allergies.  Psychiatric/Behavioral:  Negative for depression. The patient is not nervous/anxious.        Objective:    Physical Exam Vitals reviewed.  Constitutional:      Appearance: Normal appearance. He is not  ill-appearing.  HENT:     Head: Normocephalic and atraumatic.     Nose: Nose normal.  Eyes:     Conjunctiva/sclera: Conjunctivae normal.  Cardiovascular:     Rate and Rhythm: Normal rate.     Pulses: Normal pulses.     Heart sounds: Normal heart sounds. No murmur heard. Pulmonary:     Effort: Pulmonary effort is normal.     Breath sounds: Normal breath sounds. No wheezing.  Abdominal:     Palpations: Abdomen is soft. There is no mass.     Tenderness: There is no abdominal tenderness.  Musculoskeletal:     Cervical back: Normal range of motion.     Right lower leg: No edema.     Left lower leg: No edema.     Comments: Healing incision over lumbar spine, no surrounding fluctuance or discharge  Skin:    General: Skin is warm and dry.  Neurological:     General: No focal deficit present.     Mental Status: He is alert and oriented to person, place, and time.  Psychiatric:        Mood and Affect: Mood normal.     BP 130/68 (BP Location: Left Arm, Patient Position: Sitting, Cuff Size: Normal)   Pulse 76   Temp (!) 97.5 F (36.4 C) (Oral)   Resp 16   Ht 5\' 4"  (1.626 m)   Wt 187 lb 12.8 oz (85.2 kg)   SpO2 95%   BMI 32.24 kg/m  Wt Readings from Last 3 Encounters:  05/27/23 187 lb 12.8 oz (85.2 kg)  05/17/23 186 lb (84.4 kg)  05/06/23 186 lb 15.2 oz (84.8 kg)    Diabetic Foot Exam - Simple   No data filed    Lab Results  Component Value Date   WBC 10.6 (H) 05/27/2023   HGB 12.8 (L) 05/27/2023   HCT 38.3 (L) 05/27/2023   PLT 250.0 05/27/2023   GLUCOSE 93 05/17/2023   CHOL 133 02/08/2023   TRIG 125.0 02/08/2023   HDL 58.50 02/08/2023   LDLDIRECT 134.2 12/01/2013   LDLCALC 49 02/08/2023   ALT 14 02/08/2023   AST 17 02/08/2023   NA 138 05/17/2023   K 4.1 05/17/2023   CL 104 05/17/2023   CREATININE 1.00 05/17/2023   BUN 28 (H) 05/17/2023   CO2 27 05/06/2023   TSH 1.55 02/08/2023   PSA 1.80 03/04/2021   INR 1.4 (H) 07/22/2021   HGBA1C 5.5 10/27/2022     Lab Results  Component Value Date   TSH 1.55 02/08/2023   Lab Results  Component Value Date   WBC 10.6 (H) 05/27/2023   HGB 12.8 (L) 05/27/2023   HCT 38.3 (L) 05/27/2023   MCV 91.7 05/27/2023   PLT 250.0 05/27/2023   Lab Results  Component Value Date   NA 138 05/17/2023   K 4.1 05/17/2023   CO2 27 05/06/2023   GLUCOSE 93 05/17/2023   BUN 28 (H) 05/17/2023   CREATININE 1.00 05/17/2023   BILITOT 0.6 02/08/2023   ALKPHOS 59 02/08/2023   AST 17 02/08/2023   ALT 14 02/08/2023   PROT 6.7 02/08/2023   ALBUMIN 4.0 02/08/2023   CALCIUM 9.9 05/06/2023   ANIONGAP 10 05/06/2023   EGFR 57 (L) 08/12/2021   GFR 83.50 02/08/2023   Lab Results  Component Value Date   CHOL 133 02/08/2023   Lab Results  Component Value Date   HDL 58.50 02/08/2023   Lab Results  Component Value Date   LDLCALC 49 02/08/2023   Lab Results  Component Value Date   TRIG 125.0 02/08/2023   Lab Results  Component Value Date   CHOLHDL 2 02/08/2023   Lab Results  Component Value Date   HGBA1C 5.5 10/27/2022       Assessment & Plan:  Essential hypertension Assessment & Plan: Well controlled, no changes to meds. Encouraged heart healthy diet such as the DASH diet and exercise as tolerated.  Acute on chronic heart failure with preserved ejection fraction (HCC) Assessment & Plan: No recent exacerbation. Is following with cardiology for management   Hyperglycemia Assessment & Plan: hgba1c acceptable, minimize simple carbs. Increase exercise as tolerated.    Hyperlipidemia, mixed Assessment & Plan: Encourage heart healthy diet such as MIND or DASH diet, increase exercise, avoid trans fats, simple carbohydrates and processed foods, consider a krill or fish or flaxseed oil cap daily. Does not tolerate statins, is tolerating Repatha   Lumbar stenosis with neurogenic claudication Assessment & Plan: Has undergone decompression with neurosurgery,    Persistent atrial fibrillation  (HCC) Assessment & Plan: Rate controlled and following with cardiology   Statin intolerance Assessment & Plan: Tolerating Repatha   Vitamin D deficiency Assessment & Plan: Supplement and monitor    Chronic diastolic congestive heart failure (HCC) Assessment & Plan: No recent exacerbation.   Lymphocytic colitis Assessment & Plan: On budesonide   Iron deficiency anemia, unspecified iron deficiency anemia type -     CBC with Differential/Platelet  Anemia, unspecified type Assessment & Plan: Increase leafy greens, consider increased lean red meat and using cast iron cookware. Continue to monitor, report any concerns taking iron 3 x a week but wants to decrease to twice a week due to skin tags flaring after starting     Assessment and Plan    Post-Operative Pain Pain during physical therapy exercises after recent back surgery. No signs of infection or systemic illness. Pain localized to the sides of the surgical site, not the scar itself. -Continue with physical therapy but avoid exercises that cause significant pain. -Discuss pain with physical therapist and surgeon's PA at upcoming appointment. -Consider imaging if pain persists or worsens.  Urinary Incontinence Dribbling upon standing. Normal urinary stream. No other urinary symptoms. -Discuss with urologist at upcoming appointment. -Consider pelvic floor physical therapy.  Anemia History of borderline anemia. Recent increase in iron supplementation to three times weekly. -Check iron levels today to assess anemia status. -Consider reducing iron supplementation if levels are improved.  Lymphocytic Colitis Stable with current management. -Continue current management with budesonide and loperamide as needed.  General Health Maintenance -Plan to receive COVID and flu vaccines at local pharmacy in mid-September. -Consider receiving Pneumovax 23 vaccine. -Schedule follow-up appointment for end of January.          Danise Edge, MD

## 2023-05-31 ENCOUNTER — Other Ambulatory Visit: Payer: Self-pay

## 2023-05-31 DIAGNOSIS — I5032 Chronic diastolic (congestive) heart failure: Secondary | ICD-10-CM | POA: Diagnosis not present

## 2023-05-31 DIAGNOSIS — I130 Hypertensive heart and chronic kidney disease with heart failure and stage 1 through stage 4 chronic kidney disease, or unspecified chronic kidney disease: Secondary | ICD-10-CM | POA: Diagnosis not present

## 2023-05-31 DIAGNOSIS — K52832 Lymphocytic colitis: Secondary | ICD-10-CM

## 2023-05-31 DIAGNOSIS — Z4789 Encounter for other orthopedic aftercare: Secondary | ICD-10-CM | POA: Diagnosis not present

## 2023-05-31 DIAGNOSIS — D631 Anemia in chronic kidney disease: Secondary | ICD-10-CM | POA: Diagnosis not present

## 2023-05-31 DIAGNOSIS — D649 Anemia, unspecified: Secondary | ICD-10-CM

## 2023-05-31 DIAGNOSIS — M48062 Spinal stenosis, lumbar region with neurogenic claudication: Secondary | ICD-10-CM | POA: Diagnosis not present

## 2023-05-31 DIAGNOSIS — N183 Chronic kidney disease, stage 3 unspecified: Secondary | ICD-10-CM | POA: Diagnosis not present

## 2023-05-31 NOTE — Progress Notes (Unsigned)
   REFERRING PHYSICIAN:  [name], Md [address] [place],  [place] [address]  DOS: 05/17/23  L4-L5 lumbar decompression   HISTORY OF PRESENT ILLNESS: Ryan [name] is approximately 2 weeks status post L4-L5 lumbar decompression. Was given oxycodone and zanaflex on discharge from the hospital.   His preop LBP and bilateral leg pain is much better! He has only minimal pain that is intermittent. He is in HHPT and doing well.   He restarted his ELIQUIS today. He is not taking the oxycodone and robaxin.   PHYSICAL EXAMINATION:  General: Patient is well developed, well nourished, calm, collected, and in no apparent distress.   NEUROLOGICAL:  General: In no acute distress.   Awake, alert, oriented to person, place, and time.  Pupils equal round and reactive to light.  Facial tone is symmetric.     Strength:         Side Iliopsoas Quads Hamstring PF DF EHL  R 5 5 5 5 5 5   L 5 5 5 5 5 5    Incision c/d/i   ROS (Neurologic):  Negative except as noted above  IMAGING: Nothing new to review.   ASSESSMENT/PLAN:  ALCIDES DORCELY is doing well s/p above surgery. Treatment options reviewed with patient and following plan made:   - I have advised the patient to lift up to 10 pounds until 6 weeks after surgery (follow up with Dr. Myer Haff).  - Reviewed wound care.  - No bending, twisting, or lifting.  - Okay to restart ELIQUIS- he started back on it today.  - Continue on current medications including prn OTC tylenol.  - Follow up as scheduled in 4 weeks and prn.   Advised to contact the office if any questions or concerns arise.  Drake Leach PA-C Department of neurosurgery

## 2023-06-01 ENCOUNTER — Ambulatory Visit: Payer: Medicare Other | Admitting: Orthopedic Surgery

## 2023-06-01 ENCOUNTER — Encounter: Payer: Self-pay | Admitting: Orthopedic Surgery

## 2023-06-01 VITALS — BP 138/88 | Temp 98.1°F | Ht 64.0 in | Wt 187.0 lb

## 2023-06-01 DIAGNOSIS — M48062 Spinal stenosis, lumbar region with neurogenic claudication: Secondary | ICD-10-CM

## 2023-06-01 DIAGNOSIS — Z9889 Other specified postprocedural states: Secondary | ICD-10-CM

## 2023-06-01 DIAGNOSIS — M48061 Spinal stenosis, lumbar region without neurogenic claudication: Secondary | ICD-10-CM

## 2023-06-01 DIAGNOSIS — Z09 Encounter for follow-up examination after completed treatment for conditions other than malignant neoplasm: Secondary | ICD-10-CM

## 2023-06-03 DIAGNOSIS — M48062 Spinal stenosis, lumbar region with neurogenic claudication: Secondary | ICD-10-CM | POA: Diagnosis not present

## 2023-06-03 DIAGNOSIS — I5032 Chronic diastolic (congestive) heart failure: Secondary | ICD-10-CM | POA: Diagnosis not present

## 2023-06-03 DIAGNOSIS — N183 Chronic kidney disease, stage 3 unspecified: Secondary | ICD-10-CM | POA: Diagnosis not present

## 2023-06-03 DIAGNOSIS — Z4789 Encounter for other orthopedic aftercare: Secondary | ICD-10-CM | POA: Diagnosis not present

## 2023-06-03 DIAGNOSIS — I130 Hypertensive heart and chronic kidney disease with heart failure and stage 1 through stage 4 chronic kidney disease, or unspecified chronic kidney disease: Secondary | ICD-10-CM | POA: Diagnosis not present

## 2023-06-03 DIAGNOSIS — D631 Anemia in chronic kidney disease: Secondary | ICD-10-CM | POA: Diagnosis not present

## 2023-06-04 ENCOUNTER — Other Ambulatory Visit (INDEPENDENT_AMBULATORY_CARE_PROVIDER_SITE_OTHER): Payer: Medicare Other

## 2023-06-04 DIAGNOSIS — K52832 Lymphocytic colitis: Secondary | ICD-10-CM | POA: Diagnosis not present

## 2023-06-04 DIAGNOSIS — D649 Anemia, unspecified: Secondary | ICD-10-CM | POA: Diagnosis not present

## 2023-06-04 LAB — CBC WITH DIFFERENTIAL/PLATELET
Basophils Absolute: 0.1 10*3/uL (ref 0.0–0.1)
Basophils Relative: 0.8 % (ref 0.0–3.0)
Eosinophils Absolute: 0.2 10*3/uL (ref 0.0–0.7)
Eosinophils Relative: 2.8 % (ref 0.0–5.0)
HCT: 37.1 % — ABNORMAL LOW (ref 39.0–52.0)
Hemoglobin: 12.3 g/dL — ABNORMAL LOW (ref 13.0–17.0)
Lymphocytes Relative: 12.7 % (ref 12.0–46.0)
Lymphs Abs: 1 10*3/uL (ref 0.7–4.0)
MCHC: 33.2 g/dL (ref 30.0–36.0)
MCV: 91.3 fl (ref 78.0–100.0)
Monocytes Absolute: 0.7 10*3/uL (ref 0.1–1.0)
Monocytes Relative: 9 % (ref 3.0–12.0)
Neutro Abs: 5.8 10*3/uL (ref 1.4–7.7)
Neutrophils Relative %: 74.7 % (ref 43.0–77.0)
Platelets: 229 10*3/uL (ref 150.0–400.0)
RBC: 4.07 Mil/uL — ABNORMAL LOW (ref 4.22–5.81)
RDW: 14.6 % (ref 11.5–15.5)
WBC: 7.7 10*3/uL (ref 4.0–10.5)

## 2023-06-04 LAB — SEDIMENTATION RATE: Sed Rate: 25 mm/hr — ABNORMAL HIGH (ref 0–20)

## 2023-06-05 ENCOUNTER — Other Ambulatory Visit: Payer: Self-pay | Admitting: Radiation Oncology

## 2023-06-08 DIAGNOSIS — N183 Chronic kidney disease, stage 3 unspecified: Secondary | ICD-10-CM | POA: Diagnosis not present

## 2023-06-08 DIAGNOSIS — I5032 Chronic diastolic (congestive) heart failure: Secondary | ICD-10-CM | POA: Diagnosis not present

## 2023-06-08 DIAGNOSIS — Z4789 Encounter for other orthopedic aftercare: Secondary | ICD-10-CM | POA: Diagnosis not present

## 2023-06-08 DIAGNOSIS — M48062 Spinal stenosis, lumbar region with neurogenic claudication: Secondary | ICD-10-CM | POA: Diagnosis not present

## 2023-06-08 DIAGNOSIS — D631 Anemia in chronic kidney disease: Secondary | ICD-10-CM | POA: Diagnosis not present

## 2023-06-08 DIAGNOSIS — I130 Hypertensive heart and chronic kidney disease with heart failure and stage 1 through stage 4 chronic kidney disease, or unspecified chronic kidney disease: Secondary | ICD-10-CM | POA: Diagnosis not present

## 2023-06-09 DIAGNOSIS — M48062 Spinal stenosis, lumbar region with neurogenic claudication: Secondary | ICD-10-CM | POA: Diagnosis not present

## 2023-06-09 DIAGNOSIS — Z4789 Encounter for other orthopedic aftercare: Secondary | ICD-10-CM | POA: Diagnosis not present

## 2023-06-09 DIAGNOSIS — N183 Chronic kidney disease, stage 3 unspecified: Secondary | ICD-10-CM | POA: Diagnosis not present

## 2023-06-09 DIAGNOSIS — I5032 Chronic diastolic (congestive) heart failure: Secondary | ICD-10-CM | POA: Diagnosis not present

## 2023-06-09 DIAGNOSIS — D631 Anemia in chronic kidney disease: Secondary | ICD-10-CM | POA: Diagnosis not present

## 2023-06-09 DIAGNOSIS — I130 Hypertensive heart and chronic kidney disease with heart failure and stage 1 through stage 4 chronic kidney disease, or unspecified chronic kidney disease: Secondary | ICD-10-CM | POA: Diagnosis not present

## 2023-06-10 DIAGNOSIS — H353231 Exudative age-related macular degeneration, bilateral, with active choroidal neovascularization: Secondary | ICD-10-CM | POA: Diagnosis not present

## 2023-06-14 DIAGNOSIS — N183 Chronic kidney disease, stage 3 unspecified: Secondary | ICD-10-CM | POA: Diagnosis not present

## 2023-06-14 DIAGNOSIS — I130 Hypertensive heart and chronic kidney disease with heart failure and stage 1 through stage 4 chronic kidney disease, or unspecified chronic kidney disease: Secondary | ICD-10-CM | POA: Diagnosis not present

## 2023-06-14 DIAGNOSIS — M48062 Spinal stenosis, lumbar region with neurogenic claudication: Secondary | ICD-10-CM | POA: Diagnosis not present

## 2023-06-14 DIAGNOSIS — Z4789 Encounter for other orthopedic aftercare: Secondary | ICD-10-CM | POA: Diagnosis not present

## 2023-06-14 DIAGNOSIS — I5032 Chronic diastolic (congestive) heart failure: Secondary | ICD-10-CM | POA: Diagnosis not present

## 2023-06-14 DIAGNOSIS — D631 Anemia in chronic kidney disease: Secondary | ICD-10-CM | POA: Diagnosis not present

## 2023-06-16 DIAGNOSIS — M48062 Spinal stenosis, lumbar region with neurogenic claudication: Secondary | ICD-10-CM | POA: Diagnosis not present

## 2023-06-16 DIAGNOSIS — I5032 Chronic diastolic (congestive) heart failure: Secondary | ICD-10-CM | POA: Diagnosis not present

## 2023-06-16 DIAGNOSIS — D631 Anemia in chronic kidney disease: Secondary | ICD-10-CM | POA: Diagnosis not present

## 2023-06-16 DIAGNOSIS — N183 Chronic kidney disease, stage 3 unspecified: Secondary | ICD-10-CM | POA: Diagnosis not present

## 2023-06-16 DIAGNOSIS — I130 Hypertensive heart and chronic kidney disease with heart failure and stage 1 through stage 4 chronic kidney disease, or unspecified chronic kidney disease: Secondary | ICD-10-CM | POA: Diagnosis not present

## 2023-06-16 DIAGNOSIS — Z4789 Encounter for other orthopedic aftercare: Secondary | ICD-10-CM | POA: Diagnosis not present

## 2023-06-19 DIAGNOSIS — M5030 Other cervical disc degeneration, unspecified cervical region: Secondary | ICD-10-CM | POA: Diagnosis not present

## 2023-06-19 DIAGNOSIS — Z4789 Encounter for other orthopedic aftercare: Secondary | ICD-10-CM | POA: Diagnosis not present

## 2023-06-19 DIAGNOSIS — I739 Peripheral vascular disease, unspecified: Secondary | ICD-10-CM | POA: Diagnosis not present

## 2023-06-19 DIAGNOSIS — J439 Emphysema, unspecified: Secondary | ICD-10-CM | POA: Diagnosis not present

## 2023-06-19 DIAGNOSIS — H3530 Unspecified macular degeneration: Secondary | ICD-10-CM | POA: Diagnosis not present

## 2023-06-19 DIAGNOSIS — F419 Anxiety disorder, unspecified: Secondary | ICD-10-CM | POA: Diagnosis not present

## 2023-06-19 DIAGNOSIS — H547 Unspecified visual loss: Secondary | ICD-10-CM | POA: Diagnosis not present

## 2023-06-19 DIAGNOSIS — M48062 Spinal stenosis, lumbar region with neurogenic claudication: Secondary | ICD-10-CM | POA: Diagnosis not present

## 2023-06-19 DIAGNOSIS — I480 Paroxysmal atrial fibrillation: Secondary | ICD-10-CM | POA: Diagnosis not present

## 2023-06-19 DIAGNOSIS — R338 Other retention of urine: Secondary | ICD-10-CM | POA: Diagnosis not present

## 2023-06-19 DIAGNOSIS — D631 Anemia in chronic kidney disease: Secondary | ICD-10-CM | POA: Diagnosis not present

## 2023-06-19 DIAGNOSIS — K219 Gastro-esophageal reflux disease without esophagitis: Secondary | ICD-10-CM | POA: Diagnosis not present

## 2023-06-19 DIAGNOSIS — N138 Other obstructive and reflux uropathy: Secondary | ICD-10-CM | POA: Diagnosis not present

## 2023-06-19 DIAGNOSIS — M810 Age-related osteoporosis without current pathological fracture: Secondary | ICD-10-CM | POA: Diagnosis not present

## 2023-06-19 DIAGNOSIS — Z87891 Personal history of nicotine dependence: Secondary | ICD-10-CM | POA: Diagnosis not present

## 2023-06-19 DIAGNOSIS — I700 Atherosclerosis of aorta: Secondary | ICD-10-CM | POA: Diagnosis not present

## 2023-06-19 DIAGNOSIS — R32 Unspecified urinary incontinence: Secondary | ICD-10-CM | POA: Diagnosis not present

## 2023-06-19 DIAGNOSIS — I130 Hypertensive heart and chronic kidney disease with heart failure and stage 1 through stage 4 chronic kidney disease, or unspecified chronic kidney disease: Secondary | ICD-10-CM | POA: Diagnosis not present

## 2023-06-19 DIAGNOSIS — I6523 Occlusion and stenosis of bilateral carotid arteries: Secondary | ICD-10-CM | POA: Diagnosis not present

## 2023-06-19 DIAGNOSIS — H903 Sensorineural hearing loss, bilateral: Secondary | ICD-10-CM | POA: Diagnosis not present

## 2023-06-19 DIAGNOSIS — H409 Unspecified glaucoma: Secondary | ICD-10-CM | POA: Diagnosis not present

## 2023-06-19 DIAGNOSIS — I5032 Chronic diastolic (congestive) heart failure: Secondary | ICD-10-CM | POA: Diagnosis not present

## 2023-06-19 DIAGNOSIS — I2510 Atherosclerotic heart disease of native coronary artery without angina pectoris: Secondary | ICD-10-CM | POA: Diagnosis not present

## 2023-06-19 DIAGNOSIS — N401 Enlarged prostate with lower urinary tract symptoms: Secondary | ICD-10-CM | POA: Diagnosis not present

## 2023-06-19 DIAGNOSIS — N183 Chronic kidney disease, stage 3 unspecified: Secondary | ICD-10-CM | POA: Diagnosis not present

## 2023-06-23 DIAGNOSIS — I130 Hypertensive heart and chronic kidney disease with heart failure and stage 1 through stage 4 chronic kidney disease, or unspecified chronic kidney disease: Secondary | ICD-10-CM | POA: Diagnosis not present

## 2023-06-23 DIAGNOSIS — N183 Chronic kidney disease, stage 3 unspecified: Secondary | ICD-10-CM | POA: Diagnosis not present

## 2023-06-23 DIAGNOSIS — D631 Anemia in chronic kidney disease: Secondary | ICD-10-CM | POA: Diagnosis not present

## 2023-06-23 DIAGNOSIS — Z4789 Encounter for other orthopedic aftercare: Secondary | ICD-10-CM | POA: Diagnosis not present

## 2023-06-23 DIAGNOSIS — M48062 Spinal stenosis, lumbar region with neurogenic claudication: Secondary | ICD-10-CM | POA: Diagnosis not present

## 2023-06-23 DIAGNOSIS — I5032 Chronic diastolic (congestive) heart failure: Secondary | ICD-10-CM | POA: Diagnosis not present

## 2023-06-27 ENCOUNTER — Other Ambulatory Visit: Payer: Self-pay | Admitting: Family Medicine

## 2023-06-28 NOTE — Telephone Encounter (Signed)
Requesting: diazepam 5mg   Contract: 10/27/22 UDS: 10/27/22 Last Visit: 05/27/23 Next Visit: 10/14/23 Last Refill: 02/08/23 #30 and 1RF  Please Advise

## 2023-06-29 ENCOUNTER — Encounter: Payer: Self-pay | Admitting: Neurosurgery

## 2023-06-29 ENCOUNTER — Ambulatory Visit (INDEPENDENT_AMBULATORY_CARE_PROVIDER_SITE_OTHER): Payer: Medicare Other | Admitting: Neurosurgery

## 2023-06-29 VITALS — BP 128/72 | Ht 64.0 in | Wt 187.0 lb

## 2023-06-29 DIAGNOSIS — Z9889 Other specified postprocedural states: Secondary | ICD-10-CM

## 2023-06-29 DIAGNOSIS — Z09 Encounter for follow-up examination after completed treatment for conditions other than malignant neoplasm: Secondary | ICD-10-CM

## 2023-06-29 DIAGNOSIS — M48062 Spinal stenosis, lumbar region with neurogenic claudication: Secondary | ICD-10-CM

## 2023-06-29 NOTE — Progress Notes (Signed)
   REFERRING PHYSICIAN:  [name], Md [address] [place],  [place] [address]  DOS: 05/17/23  L4-L5 lumbar decompression   HISTORY OF PRESENT ILLNESS: Ryan [name] is status post L4-L5 lumbar decompression.  He is doing extremely well.  He has very minimal discomfort in his back at times.  He has very occasional discomfort in his left anterolateral calf.  Overall, his symptoms are much improved compared to before surgery.  He currently rates his pain as 1 out of 10 at worst.   PHYSICAL EXAMINATION:  General: Patient is well developed, well nourished, calm, collected, and in no apparent distress.   NEUROLOGICAL:  General: In no acute distress.   Awake, alert, oriented to person, place, and time.  Pupils equal round and reactive to light.  Facial tone is symmetric.     Strength:         Side Iliopsoas Quads Hamstring PF DF EHL  R 5 5 5 5 5 5   L 5 5 5 5 5 5    Incision c/d/i   ROS (Neurologic):  Negative except as noted above  IMAGING: Nothing new to review.   ASSESSMENT/PLAN:  Ryan [name] is doing well s/p above surgery.  I am very pleased with his improvements.  We reviewed his activity limitations.  He is cleared to begin chipping and putting it 2 months.  He is cleared to begin full swings at 3 months after surgery.    We will see him back in clinic in approximately 6 weeks.    Venetia Night MD  Department of neurosurgery

## 2023-06-30 DIAGNOSIS — Z23 Encounter for immunization: Secondary | ICD-10-CM | POA: Diagnosis not present

## 2023-07-23 DIAGNOSIS — Z8546 Personal history of malignant neoplasm of prostate: Secondary | ICD-10-CM | POA: Diagnosis not present

## 2023-07-23 DIAGNOSIS — C61 Malignant neoplasm of prostate: Secondary | ICD-10-CM | POA: Diagnosis not present

## 2023-07-27 LAB — LAB REPORT - SCANNED: PSA, Total: <0.015

## 2023-07-30 DIAGNOSIS — N400 Enlarged prostate without lower urinary tract symptoms: Secondary | ICD-10-CM | POA: Diagnosis not present

## 2023-07-30 DIAGNOSIS — Z8546 Personal history of malignant neoplasm of prostate: Secondary | ICD-10-CM | POA: Diagnosis not present

## 2023-07-31 ENCOUNTER — Other Ambulatory Visit: Payer: Self-pay | Admitting: Family Medicine

## 2023-08-01 NOTE — Progress Notes (Unsigned)
   REFERRING PHYSICIAN:  [name], Md [address] [place],  [place] [address]  DOS: 05/17/23  L4-L5 lumbar decompression   HISTORY OF PRESENT ILLNESS: Ryan [name] was doing well at his last visit with occasional discomfort in his back and left anterolateral calf.   He has no LBP. No leg pain. He notes some occasional feelings of "unsteadiness" in left knee. No numbness, tingling, or weakness.   No numbness, tingling, or weakness in his legs.   He did HHPT for 2 months and they just finished up. He continues to do his HEP.    PHYSICAL EXAMINATION:  General: Patient is well developed, well nourished, calm, collected, and in no apparent distress.   NEUROLOGICAL:  General: In no acute distress.   Awake, alert, oriented to person, place, and time.  Pupils equal round and reactive to light.  Facial tone is symmetric.     Strength:         Side Iliopsoas Quads Hamstring PF DF EHL  R 5 5 5 5 5 5   L 5 5 5 5 5 5    Incision well healed   ROS (Neurologic):  Negative except as noted above  IMAGING: Nothing new to review.   ASSESSMENT/PLAN:  Ryan [name] is doing well s/p above surgery. Treatment options reviewed with patient and following plan made:   - He can slowly return to activity as tolerated.  - Continue HEP from PT.  - Feelings of unsteadiness in the left leg may be knee mediated. He has 5/5 strength on exam. He will let me know if this does not improve.  - Will call him in December to check on his progess.  - He will f/u prn.  Advised to contact the office if any questions or concerns arise.  Ryan Leach PA-C Department of neurosurgery

## 2023-08-03 ENCOUNTER — Ambulatory Visit (INDEPENDENT_AMBULATORY_CARE_PROVIDER_SITE_OTHER): Payer: Medicare Other | Admitting: Orthopedic Surgery

## 2023-08-03 ENCOUNTER — Encounter: Payer: Self-pay | Admitting: Orthopedic Surgery

## 2023-08-03 VITALS — BP 130/88 | Temp 97.8°F | Ht 64.0 in | Wt 189.0 lb

## 2023-08-03 DIAGNOSIS — M48061 Spinal stenosis, lumbar region without neurogenic claudication: Secondary | ICD-10-CM

## 2023-08-03 DIAGNOSIS — Z9889 Other specified postprocedural states: Secondary | ICD-10-CM

## 2023-08-04 ENCOUNTER — Encounter: Payer: Medicare Other | Admitting: Orthopedic Surgery

## 2023-08-05 ENCOUNTER — Encounter: Payer: Medicare Other | Admitting: Orthopedic Surgery

## 2023-08-12 DIAGNOSIS — H353231 Exudative age-related macular degeneration, bilateral, with active choroidal neovascularization: Secondary | ICD-10-CM | POA: Diagnosis not present

## 2023-08-12 DIAGNOSIS — H35361 Drusen (degenerative) of macula, right eye: Secondary | ICD-10-CM | POA: Diagnosis not present

## 2023-09-08 ENCOUNTER — Telehealth: Payer: Self-pay | Admitting: Orthopedic Surgery

## 2023-09-08 NOTE — Telephone Encounter (Signed)
At his last visit, he was having feelings of unsteadiness in the left leg at his last visit that I thought may be coming from his knee.   Please call him and see how he is doing. Is that better?

## 2023-09-08 NOTE — Telephone Encounter (Signed)
Per patient he has unsteadiness in both legs. When he walks without a can he leans to the sides, he walks like a drunk. Dr.Yarbrough fixed his leg pain with surgery. Patient is not sure if the unsteadiness is from all his other health issues combined. His ortho doctor told him that his knees and hips are not the reason of his unsteadiness.

## 2023-09-08 NOTE — Telephone Encounter (Signed)
If he's still having unsteadiness, then I recommend he come back in for evaluation. Can see me or Danielle.   Thanks.

## 2023-09-16 NOTE — Progress Notes (Signed)
   REFERRING PHYSICIAN:  [name], Md [address] [place],  [place] [address]  DOS: 05/17/23  L4-L5 lumbar decompression   HISTORY OF PRESENT ILLNESS: Ryan [name] was doing well at his last visit with no LBP and no leg pain. He had some occasional feelings of "unsteadiness" in left knee. No numbness, tingling, or weakness.   He called in complaining of unsteadiness in both legs- he leans to the sides when he walks and walks "like a drunk" without his cane. He saw ortho and was told this was not from his knees or hips.   He is here for follow up.   He states his balance has been off since his surgery. He has no dexterity issues. No neck or arm pain. Has some numbness in his hands. No mid or lower back pain. No leg pain. No numbness, tingling, or weakness in his legs. No weakness in his arms. History of cervical fusion years ago.   PHYSICAL EXAMINATION:  Awake, alert, oriented to person, place, and time.  Speech is clear and fluent. Fund of knowledge is appropriate.   Cranial Nerves: Pupils equal round and reactive to light.  Facial tone is symmetric.    Well healed lumbar incision. No tenderness.   No abnormal lesions on exposed skin.   Strength: Side Biceps Triceps Deltoid Interossei Grip Wrist Ext. Wrist Flex.  R 5 5 5 5 5 5 5   L 5 5 5 5 5 5 5    Side Iliopsoas Quads Hamstring PF DF EHL  R 5 5 5 5 5 5   L 5 5 5 5 5 5    Reflexes are 2+ and symmetric at the biceps, brachioradialis, patella and achilles.   Hoffman's is positive on both hands.  Clonus is not present.   Bilateral upper and lower extremity sensation is intact to light touch.     Gait is very unsteady. He uses cane.   ROS (Neurologic):  Negative except as noted above  IMAGING: Nothing new to review.   ASSESSMENT/PLAN:  Ryan [name] is having balance issues and his gait is unsteady. No dexterity issues. No weakness in arms or legs. No pain. History of cervical fusion years ago.   I  think his gait is worse than it was last time I saw him in clinic.   Treatment options reviewed with patient and following plan made:   - MRI of cervical and thoracic spine to evaluate for myelopathy as cause of balance issues. He has positive hoffmans bilateral upper extremities.  - Depending on results of MRI, may also consider referral to neurology.  - Will set up phone visit to review MRI results once I have them back.   Advised to contact the office if any questions or concerns arise.  I spent a total of 15 minutes in face-to-face and non-face-to-face activities related to this patient's care today including review of outside records, review of imaging, review of symptoms, physical exam, discussion of differential diagnosis, discussion of treatment options, and documentation.   Drake Leach PA-C Department of neurosurgery

## 2023-09-17 ENCOUNTER — Ambulatory Visit (INDEPENDENT_AMBULATORY_CARE_PROVIDER_SITE_OTHER): Payer: Medicare Other | Admitting: Orthopedic Surgery

## 2023-09-17 ENCOUNTER — Encounter: Payer: Self-pay | Admitting: Orthopedic Surgery

## 2023-09-17 VITALS — BP 136/84 | Ht 64.0 in | Wt 189.0 lb

## 2023-09-17 DIAGNOSIS — Z981 Arthrodesis status: Secondary | ICD-10-CM | POA: Diagnosis not present

## 2023-09-17 DIAGNOSIS — Z9889 Other specified postprocedural states: Secondary | ICD-10-CM

## 2023-09-17 DIAGNOSIS — R2689 Other abnormalities of gait and mobility: Secondary | ICD-10-CM

## 2023-09-17 NOTE — Patient Instructions (Signed)
It was so nice to see you today. Thank you so much for coming in.    I want to get an MRI of your neck and mid back to look into things further. We will get this approved through your insurance and [facility] will call you to schedule the appointment.   After you have the MRIs, it takes 14-21 days for me to get the results back. Once I have them, we will call you to schedule a follow up phone visit with me to review them.   I want to make sure your balance issues are not from your spine. If your MRIs look good, I will likely refer you to neurology.   Please do not hesitate to call if you have any questions or concerns. You can also message me in MyChart.   Drake Leach PA-C [phone]     The physicians and staff at [facility] at [facility] are committed to providing excellent care. You may receive a survey asking for feedback about your experience at our office. We value you your feedback and appreciate you taking the time to to fill it out. The [facility] leadership team is also available to discuss your experience in person, feel free to contact us [phone].

## 2023-09-20 ENCOUNTER — Ambulatory Visit (HOSPITAL_COMMUNITY): Payer: Medicare Other

## 2023-09-23 DIAGNOSIS — H353231 Exudative age-related macular degeneration, bilateral, with active choroidal neovascularization: Secondary | ICD-10-CM | POA: Diagnosis not present

## 2023-09-23 DIAGNOSIS — H35361 Drusen (degenerative) of macula, right eye: Secondary | ICD-10-CM | POA: Diagnosis not present

## 2023-09-23 DIAGNOSIS — H3554 Dystrophies primarily involving the retinal pigment epithelium: Secondary | ICD-10-CM | POA: Diagnosis not present

## 2023-10-11 ENCOUNTER — Ambulatory Visit (HOSPITAL_COMMUNITY)
Admission: RE | Admit: 2023-10-11 | Discharge: 2023-10-11 | Disposition: A | Discharge status: Home / Self Care | Payer: Medicare Other | Source: Ambulatory Visit | Attending: Orthopedic Surgery | Admitting: Orthopedic Surgery

## 2023-10-11 DIAGNOSIS — Z9889 Other specified postprocedural states: Secondary | ICD-10-CM

## 2023-10-11 DIAGNOSIS — Z4789 Encounter for other orthopedic aftercare: Secondary | ICD-10-CM | POA: Diagnosis not present

## 2023-10-11 DIAGNOSIS — R2689 Other abnormalities of gait and mobility: Secondary | ICD-10-CM

## 2023-10-11 DIAGNOSIS — M47811 Spondylosis without myelopathy or radiculopathy, occipito-atlanto-axial region: Secondary | ICD-10-CM | POA: Diagnosis not present

## 2023-10-11 DIAGNOSIS — M5021 Other cervical disc displacement,  high cervical region: Secondary | ICD-10-CM | POA: Diagnosis not present

## 2023-10-11 DIAGNOSIS — M4802 Spinal stenosis, cervical region: Secondary | ICD-10-CM | POA: Diagnosis not present

## 2023-10-13 NOTE — Assessment & Plan Note (Signed)
Encourage heart healthy diet such as MIND or DASH diet, increase exercise, avoid trans fats, simple carbohydrates and processed foods, consider a krill or fish or flaxseed oil cap daily.  Tolerating Repatha 

## 2023-10-13 NOTE — Assessment & Plan Note (Signed)
Tolerating Repatha °

## 2023-10-13 NOTE — Assessment & Plan Note (Signed)
Labs stable no new concerns, no changes

## 2023-10-13 NOTE — Assessment & Plan Note (Signed)
 Well controlled, no changes to meds. Encouraged heart healthy diet such as the DASH diet and exercise as tolerated.

## 2023-10-13 NOTE — Assessment & Plan Note (Signed)
 [id number] acceptable, minimize simple carbs. Increase exercise as tolerated.

## 2023-10-13 NOTE — Assessment & Plan Note (Signed)
 No recent exacerbation. Following with cardiology

## 2023-10-14 ENCOUNTER — Other Ambulatory Visit: Payer: Self-pay | Admitting: Gastroenterology

## 2023-10-14 ENCOUNTER — Ambulatory Visit (INDEPENDENT_AMBULATORY_CARE_PROVIDER_SITE_OTHER): Payer: Medicare Other | Admitting: Family Medicine

## 2023-10-14 VITALS — BP 134/76 | HR 72 | Temp 97.8°F | Resp 18 | Ht 64.0 in | Wt 179.4 lb

## 2023-10-14 DIAGNOSIS — R739 Hyperglycemia, unspecified: Secondary | ICD-10-CM

## 2023-10-14 DIAGNOSIS — I10 Essential (primary) hypertension: Secondary | ICD-10-CM

## 2023-10-14 DIAGNOSIS — Z789 Other specified health status: Secondary | ICD-10-CM | POA: Diagnosis not present

## 2023-10-14 DIAGNOSIS — E782 Mixed hyperlipidemia: Secondary | ICD-10-CM | POA: Diagnosis not present

## 2023-10-14 DIAGNOSIS — E559 Vitamin D deficiency, unspecified: Secondary | ICD-10-CM

## 2023-10-14 DIAGNOSIS — I5032 Chronic diastolic (congestive) heart failure: Secondary | ICD-10-CM | POA: Diagnosis not present

## 2023-10-15 ENCOUNTER — Telehealth: Payer: Self-pay

## 2023-10-15 DIAGNOSIS — H9313 Tinnitus, bilateral: Secondary | ICD-10-CM | POA: Diagnosis not present

## 2023-10-15 DIAGNOSIS — H6123 Impacted cerumen, bilateral: Secondary | ICD-10-CM | POA: Diagnosis not present

## 2023-10-15 DIAGNOSIS — H903 Sensorineural hearing loss, bilateral: Secondary | ICD-10-CM | POA: Diagnosis not present

## 2023-10-15 LAB — CBC WITH DIFFERENTIAL/PLATELET
Basophils Absolute: 0.1 10*3/uL (ref 0.0–0.1)
Basophils Relative: 0.8 % (ref 0.0–3.0)
Eosinophils Absolute: 0.1 10*3/uL (ref 0.0–0.7)
Eosinophils Relative: 0.9 % (ref 0.0–5.0)
HCT: 37.8 % — ABNORMAL LOW (ref 39.0–52.0)
Hemoglobin: 12.7 g/dL — ABNORMAL LOW (ref 13.0–17.0)
Lymphocytes Relative: 13.8 % (ref 12.0–46.0)
Lymphs Abs: 1.4 10*3/uL (ref 0.7–4.0)
MCHC: 33.7 g/dL (ref 30.0–36.0)
MCV: 90.8 fL (ref 78.0–100.0)
Monocytes Absolute: 0.7 10*3/uL (ref 0.1–1.0)
Monocytes Relative: 6.9 % (ref 3.0–12.0)
Neutro Abs: 7.9 10*3/uL — ABNORMAL HIGH (ref 1.4–7.7)
Neutrophils Relative %: 77.6 % — ABNORMAL HIGH (ref 43.0–77.0)
Platelets: 211 10*3/uL (ref 150.0–400.0)
RBC: 4.16 Mil/uL — ABNORMAL LOW (ref 4.22–5.81)
RDW: 15.1 % (ref 11.5–15.5)
WBC: 10.2 10*3/uL (ref 4.0–10.5)

## 2023-10-15 LAB — COMPREHENSIVE METABOLIC PANEL
ALT: 10 U/L (ref 0–53)
AST: 17 U/L (ref 0–37)
Albumin: 4.4 g/dL (ref 3.5–5.2)
Alkaline Phosphatase: 62 U/L (ref 39–117)
BUN: 26 mg/dL — ABNORMAL HIGH (ref 6–23)
CO2: 27 meq/L (ref 19–32)
Calcium: 9.6 mg/dL (ref 8.4–10.5)
Chloride: 101 meq/L (ref 96–112)
Creatinine, Ser: 0.99 mg/dL (ref 0.40–1.50)
GFR: 73.87 mL/min (ref >60.00)
Glucose, Bld: 96 mg/dL (ref 70–99)
Potassium: 5.5 meq/L — ABNORMAL HIGH (ref 3.5–5.1)
Sodium: 138 meq/L (ref 135–145)
Total Bilirubin: 0.6 mg/dL (ref 0.2–1.2)
Total Protein: 7.3 g/dL (ref 6.0–8.3)

## 2023-10-15 LAB — TSH: TSH: 1.52 u[IU]/mL (ref 0.35–5.50)

## 2023-10-15 LAB — LIPID PANEL
Cholesterol: 127 mg/dL (ref 0–200)
HDL: 51.3 mg/dL (ref >39.00)
LDL Cholesterol: 48 mg/dL (ref 0–99)
NonHDL: 75.64
Total CHOL/HDL Ratio: 2
Triglycerides: 138 mg/dL (ref 0.0–149.0)
VLDL: 27.6 mg/dL (ref 0.0–40.0)

## 2023-10-15 LAB — HEMOGLOBIN A1C: Hgb A1c MFr Bld: 5.7 % (ref 4.6–6.5)

## 2023-10-15 LAB — VITAMIN D 25 HYDROXY (VIT D DEFICIENCY, FRACTURES): VITD: 113.89 ng/mL (ref 30.00–100.00)

## 2023-10-15 NOTE — Telephone Encounter (Signed)
 CRITICAL VALUE STICKER  CRITICAL VALUE: VitD: 114  RECEIVER (on-site recipient of call): Candelaria, RMA  DATE & TIME NOTIFIED:  10/15/23, 11:30 AM  MESSENGER (representative from lab): Hope  MD NOTIFIED: PCP: Dr Domenica and DOD: Dr Amon  TIME OF NOTIFICATION: 10/15/23, 11:30 AM  RESPONSE:  Pending

## 2023-10-15 NOTE — Telephone Encounter (Signed)
 Slightly elevated, advise patient to hold vitamin D until he talks with PCP

## 2023-10-15 NOTE — Telephone Encounter (Signed)
 Called patient back. Given instructions per Dr. Drue Novel orders. Pt didn't feel comfortable stopping Vitamin D until he spoke with you. He said he would cut back to 1 pill a week instead of 4 a week

## 2023-10-16 ENCOUNTER — Encounter: Payer: Self-pay | Admitting: Family Medicine

## 2023-10-16 NOTE — Progress Notes (Signed)
 Subjective:    Patient ID: Ryan [name], male    DOB: [birth year], 78 y.o.   MRN: [id number]  Chief Complaint  Patient presents with   Follow-up    HPI Discussed the use of AI scribe software for clinical note transcription with the patient, who gave verbal consent to proceed.  History of Present Illness   The patient, with a history of leg pain resolved through surgery, open heart surgery, and cancer treatment, presents with a primary complaint of instability while walking. He describes his gait as similar to that of a drunk man, requiring the use of a cane for support. Despite the use of the cane, he reports frequent bruising from falls. The patient recently underwent an MRI of the spine to investigate potential causes of his instability.  In addition to his walking instability, the patient reports ongoing sweating, despite cessation of hormone therapy for cancer treatment. He is currently on Elengard, which he describes as a wild medicine. He also mentions a history of bowel issues, for which he takes budesonide . The patient expresses concern about potential withdrawal symptoms if he stops taking budesonide .  The patient also mentions a recent consultation with a urologist, with a follow-up scheduled in February. The urologist is monitoring the patient's PSA and testosterone  levels, with the goal of keeping these as low as possible to manage his cancer. The patient describes his cancer as asleep, rather than dead.  Finally, the patient mentions a shoulder injury, which he attributes to weight lifting. He reports that he has been advised to limit his weight lifting to avoid exacerbating the injury.        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; Gleason 5+5; PSA 1.8 --> Tx'd with XRT (12/10/21 - 02/18/22) + LT-ADT; XRT  course truncated due to severe radiation colitis   Allergy    Anemia    Anxiety    a.) on BZO PRN (diazepam )   Aortic atherosclerosis (HCC)    Bilateral carotid artery disease (HCC) 03/24/2021   a.) carotid doppler 03/24/2021: 1-39% BICA   BPH with urinary obstruction    CAD (coronary artery disease) 03/26/2011   a.) cCTA 03/26/2011: Ca2+ 505 (pRCA + LM); b.) LHC 04/01/2011: 40-50% oLM, 60-70% m-dLAD, 30% mD2, 60% pRCA - med mgmt; c.) LHC 10/17/2022L 90% oLM, 60% o-pLCx, 95% pRCA, 70% dRCA, 70% RPDA --> CVTS consult; d.) s/p 4v CABG 07/22/2021   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   Chronic diastolic CHF (congestive heart failure) (HCC) 01/14/2017   a.) TTE 01/14/2017: TTE 01/14/2017: EF 55-60%, sev LA dil, triv MR, G1DD; b.) TTE 03/23/2018: EF 55-60%, mod LVH, sev LA dil, mod-sev RA dil; c.) TTE 04/17/2020: EF 60-65%, mod asym ant seg LVH, mild LA dil, mild MR, G1DD; d.) TTE 03/25/2021: EF 50-55%, AoV sclerosis, triv MR   CKD (chronic kidney disease), stage III (HCC)    COPD (chronic obstructive pulmonary disease) (HCC)    DDD (degenerative disc disease), cervical    a.) s/p ACDF C5-C7 06/08/2002   DOE (dyspnea on exertion)    Eczema    Emphysema of lung (HCC)    Family history of adverse reaction to anesthesia    a.) PONV and postoperative delirium/agitation in 1st degree relatives (sisters)   GERD (gastroesophageal reflux disease)    Glaucoma, left eye    Heart  murmur    Hemorrhoids    History of adenomatous polyp of colon    History of bilateral cataract extraction 2021   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 [place]--- dx by biopsy 01-28-2021   Macular degeneration of both eyes    followed by dr c. cheree--- bilateral eye injection every 6 wks   NSVT (nonsustained ventricular tachycardia) (HCC) 03/17/2022   a.) Zio patch 03/17/2022: 3 runs with fastest lasting 10 beats at rate of 190 bpm and longest lasting 16 beats at rate of 121 bpm   On apixaban  therapy    Osteoporosis 05/31/2016   PAF (paroxysmal atrial fibrillation) (HCC) 03/2012   a.) CHA2DS2VASc = 7 (age x2, CHF, HTN, TIA x2, vascular disease history);  b.) s/p DCCV 04/25/2018 (150J x 1 --> SB); c.) rate/rhythm maintained on oral metoprolol  tartrate; chronically anticoagulated with apixaban    Postoperative ileus (HCC) 07/2021   a.) following CABG procedure   RBBB (right bundle branch block)    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   a.) LIMA-LAD, SVG-OM1, sequential SVG-acute marginal-PDA   SNHL (sensorineural hearing loss)    Statin intolerance    Vision loss of left eye    macular degeneration   Vitamin D  deficiency     Past Surgical History:  Procedure Laterality Date   ANTERIOR CERVICAL DECOMP/DISCECTOMY FUSION  06/08/2002   APPENDECTOMY  1953   CARDIAC CATHETERIZATION Left 04/01/2011   CARDIOVERSION N/A 04/25/2018   Procedure: CARDIOVERSION;  Surgeon: Raford Riggs, MD;  Location: [facility] ENDOSCOPY;  Service: Cardiovascular;  Laterality: N/A;   CATARACT EXTRACTION W/ INTRAOCULAR LENS IMPLANT Bilateral 2021   COLONOSCOPY WITH ESOPHAGOGASTRODUODENOSCOPY (EGD)  01/28/2021   by fzinqq   CORONARY ARTERY BYPASS GRAFT N/A 07/22/2021   Procedure: 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, ON PUMP, USING LEFT INTERNAL MAMMARY ARTERY AND ENDOSCOPICALLY HARVESTED RIGHT GREATER SAPHENOUS VEIN;  Surgeon: Kerrin Elspeth BROCKS, MD;  Location: [facility] OR;  Service: Open Heart Surgery;  Laterality: N/A;   ENDOVEIN HARVEST OF GREATER SAPHENOUS VEIN  07/22/2021   Procedure: ENDOVEIN HARVEST OF GREATER SAPHENOUS VEIN;  Surgeon: Kerrin Elspeth BROCKS, MD;  Location: [facility] OR;  Service: Open Heart Surgery;;   EXTRACORPOREAL SHOCK WAVE LITHOTRIPSY     x2  1990s   EYE SURGERY     FOOT SURGERY Right 1970   calcification removed from top of foot   GOLD SEED IMPLANT N/A 11/28/2021   Procedure: GOLD SEED IMPLANT;  Surgeon: Nieves Cough, MD;  Location[facility];  Service: Urology;  Laterality: N/A;   KNEE ARTHROSCOPY Right 1991   LAPAROSCOPIC CHOLECYSTECTOMY  2000   LEFT HEART CATH AND CORONARY ANGIOGRAPHY N/A 07/21/2021   Procedure: LEFT HEART CATH AND CORONARY ANGIOGRAPHY;  Surgeon: Court Dorn PARAS, MD;  Location:  [facility] INVASIVE CV LAB;  Service: Cardiovascular;  Laterality: N/A;   LUMBAR LAMINECTOMY/DECOMPRESSION MICRODISCECTOMY N/A 05/17/2023   Procedure: L4-5 DECOMPRESSION;  Surgeon: Clois Fret, MD;  Location: [facility] ORS;  Service: Neurosurgery;  Laterality: N/A;   SPACE OAR INSTILLATION N/A 11/28/2021   Procedure: SPACE OAR INSTILLATION;  Surgeon: Nieves Cough, MD;  Location: [facility];  Service: Urology;  Laterality: N/A;   TEE WITHOUT CARDIOVERSION N/A 07/22/2021   Procedure: TRANSESOPHAGEAL ECHOCARDIOGRAM (TEE);  Surgeon: Kerrin Elspeth BROCKS, MD;  Location: [facility] OR;  Service: Open Heart Surgery;  Laterality: N/A;   TOTAL KNEE ARTHROPLASTY Right 08/14/2009   @[facility]    TRANSURETHRAL RESECTION OF PROSTATE N/A 07/17/2021   Procedure: TRANSURETHRAL RESECTION OF THE PROSTATE (TURP);  Surgeon: Matilda Senior, MD;  Location: [facility];  Service: Urology;  Laterality: N/A;  1 HR    Family History  Problem Relation Age of Onset   Heart disease Mother    Diabetes Mother    Cirrhosis Mother    Emphysema Mother        never smoked but 2nd hand through her  spouse   Hypertension Mother    Macular degeneration Mother    Heart disease Father    Cancer Father        prostate   Hyperlipidemia Father    Hypertension Father    Varicose Veins Father    Heart attack Father    Peripheral vascular disease Father    Heart disease Sister    Arthritis Sister    Hyperlipidemia Sister    Obesity Sister    Macular degeneration Sister    Heart disease Brother        5 stents   Hyperlipidemia Brother    Macular degeneration Maternal Grandfather    Cirrhosis Sister    Obesity Sister    Arthritis Sister    Heart disease Sister    Obesity Sister    Liver disease Other    Prostate cancer Other    Coronary artery disease Other    Colon cancer Neg Hx    Esophageal cancer Neg Hx    Rectal cancer Neg Hx    Stomach cancer Neg Hx     Social History   Socioeconomic History   Marital status: Married    Spouse name: Romero   Number of children: 0   Years of education: Not on file   Highest education level: Associate degree: academic program  Occupational History   Occupation: retired    Associate Professor: RETIRED  Tobacco Use   Smoking status: Former    Current packs/day: 0.00    Average packs/day: 0.5 packs/day for 50.0 years (25.0 ttl pk-yrs)    Types: Cigarettes    Start date: 05/02/1959    Quit date: 05/01/2009    Years since quitting: 14.4   Smokeless tobacco: Former    Types: Chew    Quit date: 1995   Tobacco comments:    Former smoker 01/27/22  Vaping Use   Vaping status: Never Used  Substance and Sexual Activity   Alcohol use: Yes    Alcohol/week: 2.0 standard drinks of alcohol    Types: 2 Cans of beer per week    Comment: 1-2 glasses wine/beer daily   Drug use: Never   Sexual activity: Yes    Comment: lives with wife, no dietary restrictions  Other Topics Concern   Not on file  Social History Narrative   Lives with wife   Social Drivers of Health  Financial Resource Strain: Low Risk  (10/09/2023)   Overall Financial Resource  Strain (CARDIA)    Difficulty of Paying Living Expenses: Not hard at all  Food Insecurity: No Food Insecurity (10/09/2023)   Hunger Vital Sign    Worried About Running Out of Food in the Last Year: Never true    Ran Out of Food in the Last Year: Never true  Transportation Needs: No Transportation Needs (10/09/2023)   PRAPARE - Administrator, Civil Service (Medical): No    Lack of Transportation (Non-Medical): No  Physical Activity: Insufficiently Active (10/09/2023)   Exercise Vital Sign    Days of Exercise per Week: 2 days    Minutes of Exercise per Session: 60 min  Stress: No Stress Concern Present (10/09/2023)   [employer] of Occupational Health - Occupational Stress Questionnaire    Feeling of Stress : Not at all  Social Connections: Moderately Isolated (10/09/2023)   Social Connection and Isolation Panel [NHANES]    Frequency of Communication with Friends and Family: More than three times a week    Frequency of Social Gatherings with Friends and Family: Once a week    Attends Religious Services: Never    Database Administrator or Organizations: No    Attends Banker Meetings: Never    Marital Status: Married  Catering Manager Violence: Not At Risk (05/17/2023)   Humiliation, Afraid, Rape, and Kick questionnaire    Fear of Current or Ex-Partner: No    Emotionally Abused: No    Physically Abused: No    Sexually Abused: No    Outpatient Medications Prior to Visit  Medication Sig Dispense Refill   acetaminophen  (TYLENOL ) 500 MG tablet Take 1,000 mg by mouth at bedtime.     apixaban  (ELIQUIS ) 5 MG TABS tablet Take 5 mg by mouth 2 (two) times daily.     aspirin  EC 81 MG EC tablet Take 1 tablet (81 mg total) by mouth daily. Swallow whole. (Patient taking differently: Take 81 mg by mouth every morning. Swallow whole.) 30 tablet 11   budesonide  (ENTOCORT EC ) 3 MG 24 hr capsule TAKE 2 CAPSULES(6 MG) BY MOUTH DAILY 180 capsule 0   cholecalciferol  (VITAMIN D3) 25  MCG (1000 UNIT) tablet Take 10,000 Units by mouth 4 (four) times a week. S, T, Th, Sat     diazepam  (VALIUM ) 5 MG tablet TAKE 1/2 TO 2 TABLETS(2.5 TO 10 MG) BY MOUTH EVERY 12 HOURS AS NEEDED FOR ANXIETY 30 tablet 2   Evolocumab  (REPATHA  SURECLICK) 140 MG/ML SOAJ Inject 140 mg into the skin every 14 (fourteen) days. 6 mL 3   ferrous sulfate  325 (65 FE) MG tablet Take 325 mg by mouth 3 (three) times a week. M, W, F     furosemide  (LASIX ) 20 MG tablet TAKE 1 TABLET BY MOUTH DAILY AS  NEEDED (Patient taking differently: Take 20 mg by mouth as needed.) 90 tablet 3   latanoprost  (XALATAN ) 0.005 % ophthalmic solution Place 1 drop into the left eye at bedtime.      levalbuterol  (XOPENEX  HFA) 45 MCG/ACT inhaler Inhale 2 puffs into the lungs every 4 (four) hours as needed for wheezing or shortness of breath. 15 g 5   magnesium  oxide (MAG-OX) 400 (240 Mg) MG tablet TAKE 1 TABLET(400 MG) BY MOUTH THREE TIMES DAILY 270 tablet 1   metoprolol  tartrate (LOPRESSOR ) 100 MG tablet TAKE 1 TABLET BY MOUTH TWICE  DAILY 180 tablet 3   Multiple Vitamins-Minerals (PRESERVISION AREDS 2)  CAPS Take 1 tablet by mouth daily.     nitroGLYCERIN  (NITROSTAT ) 0.4 MG SL tablet Place 1 tablet (0.4 mg total) under the tongue every 5 (five) minutes as needed for chest pain. 30 tablet 0   omeprazole  (PRILOSEC  OTC) 20 MG tablet Take 20 mg by mouth every morning.     Ranibizumab  (LUCENTIS  IO) Inject 1 Dose into the eye every 6 (six) weeks. Bilateral eye by Dr Lorrene Pugh     tamsulosin  (FLOMAX ) 0.4 MG CAPS capsule Take 0.4 mg by mouth 2 (two) times daily.     No facility-administered medications prior to visit.    Allergies  Allergen Reactions   Albumin  (Human) Anaphylaxis   Guaifenesin Anaphylaxis and Other (See Comments)    Stomach cramps   Polymyxin B-Trimethoprim Swelling    Eye drops made eyes swell   Pseudoephedrine Other (See Comments) and Hives    Stomach cramps   Codeine Hives, Itching, Rash and Other (See Comments)    Guaiacol Other (See Comments)    Hallucinations   Polymyxin B Other (See Comments), Rash and Swelling    Eye swelling   Statins Other (See Comments)    Muscle cramps  Muscle aches, Muscle cramps   Atorvastatin Other (See Comments)    Muscle aches and cramps  Other reaction(s): Other (See Comments)  Muscle Aches   Brimonidine  Tartrate Itching and Swelling   Gabapentin Other (See Comments)    Pt unsure of sensitivity  unk   Meloxicam Other (See Comments)    Pt unsure of sensitivity  Unknown reaction   Peppermint Flavoring Agent (Non-Screening) Other (See Comments)    Severe cramping   Pseudoephedrine-Guaifenesin Nausea And Vomiting    Stomach cramps   Pseudoephedrine-Guaifenesin Other (See Comments)    Stomach cramps   Rosuvastatin Calcium  Other (See Comments)    Muscle aches   Rosuvastatin Calcium  Other (See Comments)    Muscle aches   Simvastatin Other (See Comments)    Muscle aches and cramps  Other reaction(s): Other (See Comments)   Tapentadol Other (See Comments)    Pt unsure of sensitivity  Unknown reaction   Ciprofloxacin  Hives, Itching, Nausea Only, Rash and Other (See Comments)   Moxifloxacin Nausea Only and Other (See Comments)    Headaches, stomach cramps  Headaches, Stomach cramps   Rofecoxib Other (See Comments)    Stomach cramping    Review of Systems  Constitutional:  Negative for fever and malaise/fatigue.  HENT:  Negative for congestion.   Eyes:  Negative for blurred vision.  Respiratory:  Negative for shortness of breath.   Cardiovascular:  Negative for chest pain, palpitations and leg swelling.  Gastrointestinal:  Negative for abdominal pain, blood in stool and nausea.  Genitourinary:  Negative for dysuria and frequency.  Musculoskeletal:  Positive for back pain, falls and joint pain.  Skin:  Negative for rash.  Neurological:  Positive for dizziness. Negative for loss of consciousness and headaches.  Endo/Heme/Allergies:  Negative for  environmental allergies.  Psychiatric/Behavioral:  Negative for depression. The patient is not nervous/anxious.        Objective:    Physical Exam Vitals reviewed.  Constitutional:      Appearance: Normal appearance. He is not ill-appearing.  HENT:     Head: Normocephalic and atraumatic.     Nose: Nose normal.  Eyes:     Conjunctiva/sclera: Conjunctivae normal.  Cardiovascular:     Rate and Rhythm: Normal rate.     Pulses: Normal pulses.     Heart  sounds: Normal heart sounds. No murmur heard. Pulmonary:     Effort: Pulmonary effort is normal.     Breath sounds: Normal breath sounds. No wheezing.  Abdominal:     Palpations: Abdomen is soft. There is no mass.     Tenderness: There is no abdominal tenderness.  Musculoskeletal:     Cervical back: Normal range of motion.     Right lower leg: No edema.     Left lower leg: No edema.  Skin:    General: Skin is warm and dry.  Neurological:     General: No focal deficit present.     Mental Status: He is alert and oriented to person, place, and time.  Psychiatric:        Mood and Affect: Mood normal.     BP 134/76 (BP Location: Left Arm, Patient Position: Sitting, Cuff Size: Large)   Pulse 72   Temp 97.8 F (36.6 C) (Oral)   Resp 18   Ht 5' 4 (1.626 m)   Wt 179 lb 6.4 oz (81.4 kg)   SpO2 95%   BMI 30.79 kg/m  Wt Readings from Last 3 Encounters:  10/14/23 179 lb 6.4 oz (81.4 kg)  09/17/23 189 lb (85.7 kg)  08/03/23 189 lb (85.7 kg)    Diabetic Foot Exam - Simple   No data filed    Lab Results  Component Value Date   WBC 10.2 10/14/2023   HGB 12.7 (L) 10/14/2023   HCT 37.8 (L) 10/14/2023   PLT 211.0 10/14/2023   GLUCOSE 96 10/14/2023   CHOL 127 10/14/2023   TRIG 138.0 10/14/2023   HDL 51.30 10/14/2023   LDLDIRECT 134.2 12/01/2013   LDLCALC 48 10/14/2023   ALT 10 10/14/2023   AST 17 10/14/2023   NA 138 10/14/2023   K 5.5 (H) 10/14/2023   CL 101 10/14/2023   CREATININE 0.99 10/14/2023   BUN 26 (H)  10/14/2023   CO2 27 10/14/2023   TSH 1.52 10/14/2023   PSA 1.80 03/04/2021   INR 1.4 (H) 07/22/2021   HGBA1C 5.7 10/14/2023    Lab Results  Component Value Date   TSH 1.52 10/14/2023   Lab Results  Component Value Date   WBC 10.2 10/14/2023   HGB 12.7 (L) 10/14/2023   HCT 37.8 (L) 10/14/2023   MCV 90.8 10/14/2023   PLT 211.0 10/14/2023   Lab Results  Component Value Date   NA 138 10/14/2023   K 5.5 (H) 10/14/2023   CO2 27 10/14/2023   GLUCOSE 96 10/14/2023   BUN 26 (H) 10/14/2023   CREATININE 0.99 10/14/2023   BILITOT 0.6 10/14/2023   ALKPHOS 62 10/14/2023   AST 17 10/14/2023   ALT 10 10/14/2023   PROT 7.3 10/14/2023   ALBUMIN  4.4 10/14/2023   CALCIUM  9.6 10/14/2023   ANIONGAP 10 05/06/2023   EGFR 57 (L) 08/12/2021   GFR 73.87 10/14/2023   Lab Results  Component Value Date   CHOL 127 10/14/2023   Lab Results  Component Value Date   HDL 51.30 10/14/2023   Lab Results  Component Value Date   LDLCALC 48 10/14/2023   Lab Results  Component Value Date   TRIG 138.0 10/14/2023   Lab Results  Component Value Date   CHOLHDL 2 10/14/2023   Lab Results  Component Value Date   HGBA1C 5.7 10/14/2023       Assessment & Plan:  Chronic diastolic congestive heart failure (HCC) Assessment & Plan: No recent exacerbation. Following with cardiology  Essential hypertension Assessment & Plan: Well controlled, no changes to meds. Encouraged heart healthy diet such as the DASH diet and exercise as tolerated.    Orders: -     CBC with Differential/Platelet -     Comprehensive metabolic panel -     TSH  Hyperglycemia Assessment & Plan: hgba1c acceptable, minimize simple carbs. Increase exercise as tolerated.   Orders: -     Hemoglobin A1c  Hyperlipidemia, mixed Assessment & Plan: Encourage heart healthy diet such as MIND or DASH diet, increase exercise, avoid trans fats, simple carbohydrates and processed foods, consider a krill or fish or flaxseed oil  cap daily.  Tolerating Repatha   Orders: -     Lipid panel  Vitamin D  deficiency Assessment & Plan: Labs stable no new concerns, no changes   Orders: -     VITAMIN D  25 Hydroxy (Vit-D Deficiency, Fractures)  Statin intolerance Assessment & Plan: Tolerating Repatha      Assessment and Plan    Gait Instability Post-surgical gait instability with no associated pain. Recent MRI of the spine to investigate potential causes. Possible referral to a neurologist if no spinal issues are identified. -Follow up with results of the MRI. -Consider referral to a neurologist if MRI does not reveal a cause for the gait instability.  Prostate Cancer Undergoing treatment with Elengard. PSA and testosterone  levels are being monitored. -Continue current treatment and monitoring plan. -Follow up with Dr. Shane in February to review PSA, urine count, and testosterone  levels.  Anemia Patient is resistant to taking iron supplements. Discussed the possibility of IV iron if anemia worsens significantly. -Check CBC to monitor anemia. -Consider IV iron if anemia worsens significantly.  Diarrhea Managed with Budesonide  and Lomotil  as needed. -Continue current management plan.  General Health Maintenance -Follow up in 3 months for a checkup and in 6 months for a full follow-up.         Harlene Horton, MD

## 2023-10-18 ENCOUNTER — Other Ambulatory Visit: Payer: Medicare Other

## 2023-10-19 ENCOUNTER — Encounter: Payer: Self-pay | Admitting: *Deleted

## 2023-10-20 ENCOUNTER — Ambulatory Visit: Payer: Medicare Other | Admitting: Orthopedic Surgery

## 2023-10-20 ENCOUNTER — Encounter: Payer: Self-pay | Admitting: Orthopedic Surgery

## 2023-10-20 DIAGNOSIS — Z981 Arthrodesis status: Secondary | ICD-10-CM | POA: Diagnosis not present

## 2023-10-20 DIAGNOSIS — R2689 Other abnormalities of gait and mobility: Secondary | ICD-10-CM | POA: Diagnosis not present

## 2023-10-20 DIAGNOSIS — M4802 Spinal stenosis, cervical region: Secondary | ICD-10-CM

## 2023-10-20 DIAGNOSIS — G9589 Other specified diseases of spinal cord: Secondary | ICD-10-CM | POA: Diagnosis not present

## 2023-10-20 DIAGNOSIS — Z9889 Other specified postprocedural states: Secondary | ICD-10-CM

## 2023-10-20 NOTE — Progress Notes (Signed)
 Telephone Visit- Progress Note: Referring Physician:  No referring provider defined for this encounter.  Primary Physician:  Neda Balk, MD  This visit was performed via telephone.  Patient location: home Provider location: office  I spent a total of 10 minutes non-face-to-face activities for this visit on the date of this encounter including review of current clinical condition and response to treatment.    Patient has given verbal consent to this telephone visits and we reviewed the limitations of a telephone visit. Patient wishes to proceed.    Chief Complaint:  review MRI results  DOS: 05/17/23  L4-L5 lumbar decompression   HISTORY OF PRESENT ILLNESS:  Last seen by me on 09/17/23 with complaints of balance issues. He feels like he walks "like a drunk."   MRI of cervical and thoracic spine ordered and phone visit scheduled to discuss them.   No change in his balance. He still feels like it is off. Needs to use a cane and still feels like he does not walk straight. He has unsteadiness in his legs. No neck, mid back, or lower back pain. No arm or leg pain. No numbness, tingling, or weakness.   History of cervical fusion years ago.  He is on ELIQUIS .  Exam: No exam done as this was a telephone encounter.     Imaging: Cervical and thoracic MRI dated 10/11/23:  FINDINGS: MRI CERVICAL SPINE FINDINGS   Alignment: Straightening of the normal cervical lordosis   Vertebrae: No fracture, evidence of discitis, or bone lesion. Status post C5-C7 ACDF with solid osseous fusion at C6-C7 no evidence of hardware complications.   Cord: There is T2 hyperintense cord signal abnormality of the C4-C5 level, suggestive of chronic myelomalacia.   Posterior Fossa, vertebral arteries, paraspinal tissues: Negative.   Disc levels:   C1-C2: Mild degenerative change.   C2-C3: Moderate left and mild right facet degenerative change. Uncovertebral hypertrophy. Moderate left neural  foraminal narrowing. Minimal disc bulge. No significant spinal canal narrowing.   C3-C4: Minimal disc bulge. Mild bilateral facet degenerative change. Uncovertebral hypertrophy. Ligamentum flavum hypertrophy. Mild spinal canal narrowing. Moderate bilateral neural foraminal narrowing.   C4-C5: Junctional level. Severe spinal canal stenosis. Circumferential disc bulge. Ligamentum flavum hypertrophy. Mild bilateral facet degenerative change. Uncovertebral hypertrophy. Severe bilateral neural foraminal narrowing.   C5-C6: Fused level. No significant spinal canal narrowing. Mild bilateral neural foraminal narrowing.   C6-C7: Fused level. No spinal canal narrowing. Mild bilateral neural foraminal narrowing.   C7-T1: Lower junctional level. Uncovertebral hypertrophy. Mild bilateral facet degenerative change. Minimal disc bulge. Mild spinal canal narrowing. Moderate right neural foraminal narrowing.   MRI THORACIC SPINE FINDINGS   Alignment:  Physiologic.   Vertebrae: No fracture, evidence of discitis, or bone lesion. Osseous hemangioma at T3.   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 There is a ventral displacement of the thoracic spinal cord centered at the T6 vertebral body level (series 3, image 6). This appears unchanged compared to prior thoracic spine MRI dated 11/30/2007. Given the relatively focal nature, differential considerations include an arachnoid web and dorsal arachnoid cyst.   Paraspinal and other soft tissues: Negative.   Disc levels:   No evidence of high-grade spinal canal or neural foraminal stenosis.   IMPRESSION: 1. Status post C5-C7 ACDF with solid osseous fusion at C6-C7. No evidence of hardware complications. 2. Severe spinal canal stenosis at C4-C5 with associated cord  signal abnormality, which is favored to represent chronic myelomalacia. 3.  Severe bilateral neural foraminal narrowing at C4-C5. 4. 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 This is unchanged from prior. 5. 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 Differential considerations include an arachnoid web or dorsal arachnoid cyst.     Electronically Signed   By: Clora Dane M.D.   On: 10/20/2023 06:44    I have personally reviewed the images and agree with the above interpretation.  Assessment and Plan: Mr. Haydu continues to have balance issues and his gait is unsteady. No dexterity issues. No weakness in arms or legs. No pain. History of cervical fusion years ago.   He has known ACDF C5-C7 with severe spinal stenosis and myelomalacia at C4-C5 that is favored to be chronic per radiology. Also with syrinx T3-T6, unchanged from prior thoracic MRI 11/30/2007 per radiology.   Balance issues likely from cervical stenosis.   Treatment options discussed with patient and following plan made:   - Discussed that balance issues are likely from cervical stenosis.  - Follow up made with Dr. Mont Antis to discuss further treatment options including surgery.   Lucetta Russel PA-C Neurosurgery

## 2023-10-25 ENCOUNTER — Other Ambulatory Visit (INDEPENDENT_AMBULATORY_CARE_PROVIDER_SITE_OTHER): Payer: Medicare Other

## 2023-10-25 DIAGNOSIS — E875 Hyperkalemia: Secondary | ICD-10-CM

## 2023-10-25 NOTE — Progress Notes (Unsigned)
Referring Physician:  [name], MD [name] [place],  [place] [address]  Primary Physician:  Ryan Canary, MD  Telephone Visit- Progress Note: 10/20/2023 Notes from Ryan [name], [place] Referring Physician:  No referring provider defined for this encounter.   Primary Physician:  Ryan Canary, MD   This visit was performed via telephone.   Patient location: home Provider location: office   I spent a total of 10 minutes non-face-to-face activities for this visit on the date of this encounter including review of current clinical condition and response to treatment.      Patient has given verbal consent to this telephone visits and we reviewed the limitations of a telephone visit. Patient wishes to proceed.      Chief Complaint:  review MRI results   DOS: 05/17/23  L4-L5 lumbar decompression   HISTORY OF PRESENT ILLNESS:  Last seen by me on 09/17/23 with complaints of balance issues. He feels like he walks "like a drunk."    MRI of cervical and thoracic spine ordered and phone visit scheduled to discuss them.    No change in his balance. He still feels like it is off. Needs to use a cane and still feels like he does not walk straight. He has unsteadiness in his legs. No neck, mid back, or lower back pain. No arm or leg pain. No numbness, tingling, or weakness.    History of cervical fusion years ago.   He is on ELIQUIS.    History of Present Illness: 10/25/2023 [name] is here today with a chief complaint of ***    Ryan [name] has ***no symptoms of cervical myelopathy.  The symptoms are causing a significant impact on the patient's life.   I have utilized the care everywhere function in epic to review the outside records available from external health systems.  Review of Systems:  A 10 point review of systems is negative, except for the pertinent positives and negatives detailed in the HPI.  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; Gleason 5+5; PSA 1.8 --> Tx'd with XRT (12/10/21 - 02/18/22) + LT-ADT; XRT course truncated due to severe radiation colitis   Allergy    Anemia    Anxiety    a.) on BZO PRN (diazepam)   Aortic atherosclerosis (HCC)    Bilateral carotid artery disease (HCC) 03/24/2021   a.) carotid doppler 03/24/2021: 1-39% BICA   BPH with urinary obstruction    CAD (coronary artery disease) 03/26/2011   a.) cCTA 03/26/2011: Ca2+ 505 (pRCA + LM); b.) LHC 04/01/2011: 40-50% oLM, 60-70% m-dLAD, 30% mD2, 60% pRCA - med mgmt; c.) LHC 10/17/2022L 90% oLM, 60% o-pLCx, 95% pRCA, 70% dRCA, 70% RPDA --> CVTS consult; d.) s/p 4v CABG 07/22/2021   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   Chronic diastolic CHF (congestive heart failure) (HCC) 01/14/2017   a.) TTE 01/14/2017: TTE 01/14/2017: EF 55-60%, sev LA dil, triv MR, G1DD; b.) TTE 03/23/2018: EF 55-60%, mod LVH, sev LA dil, mod-sev RA dil; c.) TTE 04/17/2020: EF 60-65%, mod asym ant seg LVH, mild LA dil, mild MR, G1DD; d.) TTE 03/25/2021: EF 50-55%, AoV sclerosis, triv MR   CKD (chronic kidney disease), stage III (HCC)    COPD (chronic obstructive pulmonary disease) (HCC)  DDD (degenerative disc disease), cervical    a.) s/p ACDF C5-C7 06/08/2002   DOE (dyspnea on exertion)    Eczema    Emphysema of lung (HCC)    Family history of adverse reaction to anesthesia    a.) PONV and postoperative delirium/agitation in 1st degree relatives (sisters)   GERD (gastroesophageal reflux disease)    Glaucoma, left eye    Heart murmur    Hemorrhoids    History of adenomatous polyp of colon    History of bilateral cataract extraction 2021   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 Barron Alvine--- dx by biopsy 01-28-2021   Macular degeneration of both eyes    followed by dr c. Gwendalyn Ege--- bilateral eye injection every 6 wks   NSVT (nonsustained ventricular tachycardia) (HCC) 03/17/2022   a.) Zio patch 03/17/2022: 3 runs with fastest lasting 10 beats at rate of 190 bpm and longest lasting 16 beats at rate of 121 bpm   On apixaban therapy    Osteoporosis 05/31/2016   PAF (paroxysmal atrial fibrillation) (HCC) 03/2012   a.) CHA2DS2VASc = 7 (age x2, CHF, HTN, TIA x2, vascular disease history);  b.) s/p DCCV 04/25/2018 (150J x 1 --> SB); c.) rate/rhythm maintained on oral metoprolol tartrate; chronically anticoagulated with apixaban   Postoperative ileus (HCC) 07/2021   a.) following CABG procedure   RBBB (right bundle branch block)    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   a.) LIMA-LAD, SVG-OM1, sequential SVG-acute marginal-PDA   SNHL (sensorineural hearing loss)    Statin intolerance    Vision loss of left eye    macular degeneration   Vitamin D deficiency     Past Surgical History: Past Surgical History:  Procedure Laterality Date   ANTERIOR CERVICAL DECOMP/DISCECTOMY FUSION  06/08/2002   APPENDECTOMY  1953   CARDIAC CATHETERIZATION Left 04/01/2011   CARDIOVERSION N/A 04/25/2018   Procedure: CARDIOVERSION;  Surgeon: Chilton Si, MD;  Location[facility];  Service: Cardiovascular;  Laterality: N/A;   CATARACT EXTRACTION W/ INTRAOCULAR LENS IMPLANT Bilateral 2021   COLONOSCOPY  WITH ESOPHAGOGASTRODUODENOSCOPY (EGD)  01/28/2021   by UUVOZD   CORONARY ARTERY BYPASS GRAFT N/A 07/22/2021   Procedure: 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, ON PUMP, USING LEFT INTERNAL MAMMARY ARTERY AND ENDOSCOPICALLY HARVESTED RIGHT GREATER SAPHENOUS VEIN;  Surgeon: Loreli Slot, MD;  Location: [facility] OR;  Service: Open Heart Surgery;  Laterality: N/A;   ENDOVEIN HARVEST OF GREATER SAPHENOUS VEIN  07/22/2021   Procedure: ENDOVEIN HARVEST OF GREATER SAPHENOUS VEIN;  Surgeon: Loreli Slot, MD;  Location: [facility] OR;  Service: Open Heart Surgery;;   EXTRACORPOREAL SHOCK WAVE LITHOTRIPSY     x2  1990s   EYE SURGERY     FOOT SURGERY Right 1970   calcification removed from  top of foot   GOLD SEED IMPLANT N/A 11/28/2021   Procedure: GOLD SEED IMPLANT;  Surgeon: Jerilee Field, MD;  Location: [facility];  Service: Urology;  Laterality: N/A;   KNEE ARTHROSCOPY Right 1991   LAPAROSCOPIC CHOLECYSTECTOMY  2000   LEFT HEART CATH AND CORONARY ANGIOGRAPHY N/A 07/21/2021   Procedure: LEFT HEART CATH AND CORONARY ANGIOGRAPHY;  Surgeon: Runell Gess, MD;  Location: [facility] INVASIVE CV LAB;  Service: Cardiovascular;  Laterality: N/A;   LUMBAR LAMINECTOMY/DECOMPRESSION MICRODISCECTOMY N/A 05/17/2023   Procedure: L4-5 DECOMPRESSION;  Surgeon: Venetia Night, MD;  Location: [facility] ORS;  Service: Neurosurgery;  Laterality: N/A;   SPACE OAR INSTILLATION N/A 11/28/2021   Procedure: SPACE OAR INSTILLATION;  Surgeon: Jerilee Field, MD;  Location: [facility];  Service: Urology;  Laterality: N/A;   TEE WITHOUT CARDIOVERSION N/A 07/22/2021   Procedure: TRANSESOPHAGEAL ECHOCARDIOGRAM (TEE);  Surgeon: Loreli Slot, MD;  Location: [facility] OR;  Service: Open Heart Surgery;  Laterality: N/A;   TOTAL KNEE ARTHROPLASTY Right 08/14/2009   @[facility]    TRANSURETHRAL RESECTION OF PROSTATE N/A 07/17/2021   Procedure: TRANSURETHRAL RESECTION OF THE PROSTATE (TURP);   Surgeon: Marcine Matar, MD;  Location: [facility];  Service: Urology;  Laterality: N/A;  1 HR    Allergies: Allergies as of 10/26/2023 - Review Complete 10/20/2023  Allergen Reaction Noted   Albumin (human) Anaphylaxis 04/12/2020   Guaifenesin Anaphylaxis and Other (See Comments) 03/21/2014   Polymyxin b-trimethoprim Swelling 01/28/2012   Pseudoephedrine Other (See Comments) and Hives 03/21/2014   Codeine Hives, Itching, Rash, and Other (See Comments) 03/21/2007   Guaiacol Other (See Comments) 06/08/2013   Polymyxin b Other (See Comments), Rash, and Swelling 06/08/2013   Statins Other (See Comments) 03/21/2007   Atorvastatin Other (See Comments) 03/21/2007   Brimonidine tartrate Itching and Swelling 10/27/2022   Gabapentin Other (See Comments) 01/28/2021   Meloxicam Other (See Comments) 11/07/2010   Peppermint flavoring agent (non-screening) Other (See Comments) 07/25/2021   Pseudoephedrine-guaifenesin Nausea And Vomiting 02/25/2014   Pseudoephedrine-guaifenesin Other (See Comments) 02/25/2014   Rosuvastatin calcium Other (See Comments) 04/26/2014   Rosuvastatin calcium Other (See Comments) 04/26/2014   Simvastatin Other (See Comments) 03/21/2007   Tapentadol Other (See Comments) 11/07/2010   Ciprofloxacin Hives, Itching, Nausea Only, Rash, and Other (See Comments) 03/21/2007   Moxifloxacin Nausea Only and Other (See Comments) 11/07/2010   Rofecoxib Other (See Comments) 06/08/2013    Medications:  Current Outpatient Medications:    acetaminophen (TYLENOL) 500 MG tablet, Take 1,000 mg by mouth at bedtime., Disp: , Rfl:    apixaban (ELIQUIS) 5 MG TABS tablet, Take 5 mg by mouth 2 (two) times daily., Disp: , Rfl:    aspirin EC 81 MG EC tablet, Take 1 tablet (81 mg total) by mouth daily. Swallow whole. (Patient taking differently: Take 81 mg by mouth every morning. Swallow whole.), Disp: 30 tablet, Rfl: 11   budesonide (ENTOCORT EC) 3 MG 24 hr capsule, TAKE 2  CAPSULES(6 MG) BY MOUTH DAILY, Disp: 180 capsule, Rfl: 0   cholecalciferol (VITAMIN D3) 25 MCG (1000 UNIT) tablet, Take 10,000 Units by mouth 4 (four) times a week. S, T, Th, Sat, Disp: , Rfl:    diazepam (VALIUM) 5 MG tablet, TAKE 1/2 TO 2 TABLETS(2.5 TO 10 MG) BY MOUTH EVERY 12 HOURS AS NEEDED FOR ANXIETY, Disp: 30 tablet, Rfl: 2   Evolocumab (REPATHA SURECLICK) 140 MG/ML SOAJ, Inject 140 mg into the skin every 14 (fourteen) days., Disp: 6 mL, Rfl: 3  ferrous sulfate 325 (65 FE) MG tablet, Take 325 mg by mouth 3 (three) times a week. M, W, F, Disp: , Rfl:    furosemide (LASIX) 20 MG tablet, TAKE 1 TABLET BY MOUTH DAILY AS  NEEDED (Patient taking differently: Take 20 mg by mouth as needed.), Disp: 90 tablet, Rfl: 3   latanoprost (XALATAN) 0.005 % ophthalmic solution, Place 1 drop into the left eye at bedtime. , Disp: , Rfl:    levalbuterol (XOPENEX HFA) 45 MCG/ACT inhaler, Inhale 2 puffs into the lungs every 4 (four) hours as needed for wheezing or shortness of breath., Disp: 15 g, Rfl: 5   magnesium oxide (MAG-OX) 400 (240 Mg) MG tablet, TAKE 1 TABLET(400 MG) BY MOUTH THREE TIMES DAILY, Disp: 270 tablet, Rfl: 1   metoprolol tartrate (LOPRESSOR) 100 MG tablet, TAKE 1 TABLET BY MOUTH TWICE  DAILY, Disp: 180 tablet, Rfl: 3   Multiple Vitamins-Minerals (PRESERVISION AREDS 2) CAPS, Take 1 tablet by mouth daily., Disp: , Rfl:    nitroGLYCERIN (NITROSTAT) 0.4 MG SL tablet, Place 1 tablet (0.4 mg total) under the tongue every 5 (five) minutes as needed for chest pain., Disp: 30 tablet, Rfl: 0   omeprazole (PRILOSEC OTC) 20 MG tablet, Take 20 mg by mouth every morning., Disp: , Rfl:    Ranibizumab (LUCENTIS IO), Inject 1 Dose into the eye every 6 (six) weeks. Bilateral eye by Dr Marvis Repress, Disp: , Rfl:    tamsulosin (FLOMAX) 0.4 MG CAPS capsule, Take 0.4 mg by mouth 2 (two) times daily., Disp: , Rfl:   Social History: Social History   Tobacco Use   Smoking status: Former    Current packs/day: 0.00     Average packs/day: 0.5 packs/day for 50.0 years (25.0 ttl pk-yrs)    Types: Cigarettes    Start date: 05/02/1959    Quit date: 05/01/2009    Years since quitting: 14.4   Smokeless tobacco: Former    Types: Chew    Quit date: 1995   Tobacco comments:    Former smoker 01/27/22  Vaping Use   Vaping status: Never Used  Substance Use Topics   Alcohol use: Yes    Alcohol/week: 2.0 standard drinks of alcohol    Types: 2 Cans of beer per week    Comment: 1-2 glasses wine/beer daily   Drug use: Never    Family Medical History: Family History  Problem Relation Age of Onset   Heart disease Mother    Diabetes Mother    Cirrhosis Mother    Emphysema Mother        never smoked but 2nd hand through her spouse   Hypertension Mother    Macular degeneration Mother    Heart disease Father    Cancer Father        prostate   Hyperlipidemia Father    Hypertension Father    Varicose Veins Father    Heart attack Father    Peripheral vascular disease Father    Heart disease Sister    Arthritis Sister    Hyperlipidemia Sister    Obesity Sister    Macular degeneration Sister    Heart disease Brother        5 stents   Hyperlipidemia Brother    Macular degeneration Maternal Grandfather    Cirrhosis Sister    Obesity Sister    Arthritis Sister    Heart disease Sister    Obesity Sister    Liver disease Other    Prostate cancer Other  Coronary artery disease Other    Colon cancer Neg Hx    Esophageal cancer Neg Hx    Rectal cancer Neg Hx    Stomach cancer Neg Hx     Physical Examination: There were no vitals filed for this visit.  General: Patient is in no apparent distress. Attention to examination is appropriate.  Neck:   Supple.  Full range of motion.  Respiratory: Patient is breathing without any difficulty.   NEUROLOGICAL:     Awake, alert, oriented to person, place, and time.  Speech is clear and fluent.   Cranial Nerves: Pupils equal round and reactive to light.   Facial tone is symmetric.  Facial sensation is symmetric. Shoulder shrug is symmetric. Tongue protrusion is midline.  There is no pronator drift.  Strength: Side Biceps Triceps Deltoid Interossei Grip Wrist Ext. Wrist Flex.  R 5 5 5 5 5 5 5   L 5 5 5 5 5 5 5    Side Iliopsoas Quads Hamstring PF DF EHL  R 5 5 5 5 5 5   L 5 5 5 5 5 5    Reflexes are ***2+ and symmetric at the biceps, triceps, brachioradialis, patella and achilles.   Hoffman's is absent.   Bilateral upper and lower extremity sensation is intact to light touch.    No evidence of dysmetria noted.  Gait is normal.     Medical Decision Making  Imaging: ***  I have personally reviewed the images and agree with the above interpretation.  Assessment and Plan: Mr. Rogalski is a pleasant 78 y.o. male with ***    Thank you for involving me in the care of this patient.      Samual Beals K. Myer Haff MD, [facility] Neurosurgery

## 2023-10-25 NOTE — Progress Notes (Signed)
Pt here for lab visit. Thought he was here to recheck vit D.  Read recent result note to pt and advised that we are rechecking a metabolic panel to follow up on elevated potassium. Pt states he was not aware of this. Reviewed foods high in potassium with pt and he states he has cut back on raisins since he saw PCP last due to problems with his dentures and has reduced Premier protein drink to every other day since his last visit. Advised pt both of these foods are on high potassium list. He denies frequent use of other high potassium foods in his diet and wants to proceed with lab today stating we should see some difference since he had already made changes with these 2 foods prior to today.

## 2023-10-26 ENCOUNTER — Encounter: Payer: Self-pay | Admitting: Neurosurgery

## 2023-10-26 ENCOUNTER — Encounter: Payer: Self-pay | Admitting: Family Medicine

## 2023-10-26 ENCOUNTER — Ambulatory Visit (INDEPENDENT_AMBULATORY_CARE_PROVIDER_SITE_OTHER): Payer: Medicare Other | Admitting: Neurosurgery

## 2023-10-26 VITALS — BP 150/80 | Wt 181.2 lb

## 2023-10-26 DIAGNOSIS — G9589 Other specified diseases of spinal cord: Secondary | ICD-10-CM

## 2023-10-26 DIAGNOSIS — M4802 Spinal stenosis, cervical region: Secondary | ICD-10-CM | POA: Diagnosis not present

## 2023-10-26 DIAGNOSIS — G959 Disease of spinal cord, unspecified: Secondary | ICD-10-CM

## 2023-10-26 LAB — COMPREHENSIVE METABOLIC PANEL
ALT: 10 U/L (ref 0–53)
AST: 16 U/L (ref 0–37)
Albumin: 4.3 g/dL (ref 3.5–5.2)
Alkaline Phosphatase: 61 U/L (ref 39–117)
BUN: 25 mg/dL — ABNORMAL HIGH (ref 6–23)
CO2: 26 meq/L (ref 19–32)
Calcium: 9.5 mg/dL (ref 8.4–10.5)
Chloride: 101 meq/L (ref 96–112)
Creatinine, Ser: 0.93 mg/dL (ref 0.40–1.50)
GFR: 79.61 mL/min (ref >60.00)
Glucose, Bld: 99 mg/dL (ref 70–99)
Potassium: 5.3 meq/L — ABNORMAL HIGH (ref 3.5–5.1)
Sodium: 138 meq/L (ref 135–145)
Total Bilirubin: 0.6 mg/dL (ref 0.2–1.2)
Total Protein: 6.9 g/dL (ref 6.0–8.3)

## 2023-10-26 NOTE — Patient Instructions (Addendum)
 Please see below for information in regards to your upcoming surgery:   Planned surgery: C4-5 laminectomy, C4-7 posterior spinal instrumentation   Surgery date: 01/10/24 at [facility] (Medical Mall: [address], [place], [place] [address]) - you will find out your arrival time the business day before your surgery.   Pre-op appointment at [facility] Pre-admit Testing: we will call you with a date/time for this. If you are scheduled for an in person appointment, Pre-admit Testing is located on the first floor of the [facility] building, [address] [facility] [address], [address]. Please bring all prescriptions in the original prescription bottles to your appointment. During this appointment, they will advise you which medications you can take the morning of surgery, and which medications you will need to hold for surgery. Labs (such as blood work, EKG) may be done at your pre-op appointment. You are not required to fast for these labs. Should you need to change your pre-op appointment, please call Pre-admit testing at [phone].     Blood thinners:   Aspirin 81mg :  OK to stay on aspirin 81mg   Eliquis:   stop Eliquis 3 days prior, resume Eliquis  addendum 12/24/23: resume Eliquis 10 days after per discussion with Dr Ladona Ridgel and Dr Myer Haff    Surgical clearance: we will send a clearance form to Dr Abner Greenspan and Dr Ladona Ridgel. They may wish to see you in their office prior to signing the clearance form. If so, they may call you to schedule an appointment.    NSAIDS (Non-steroidal anti-inflammatory drugs): because you are having a fusion, please avoid taking any NSAIDS (examples: ibuprofen, motrin, aleve, naproxen, meloxicam, diclofenac) for 3 months after surgery. Celebrex is an exception and is OK to take, if prescribed. Tylenol is not an NSAID.    Common restrictions after surgery: No bending, lifting, or twisting ("BLT"). Avoid lifting objects heavier than 10 pounds  for the first 6 weeks after surgery. Where possible, avoid household activities that involve lifting, bending, reaching, pushing, or pulling such as laundry, vacuuming, grocery shopping, and childcare. Try to arrange for help from friends and family for these activities while you heal. Do not drive while taking prescription pain medication. Weeks 6 through 12 after surgery: avoid lifting more than 25 pounds.    X-rays after surgery: Because you are having a fusion or arthroplasty: for appointments after your 2 week follow-up: please arrive at the [facility] outpatient imaging center ([address], [place]) or [facility] one hour prior to your appointment for x-rays. This applies to every appointment after your 2 week follow-up. Failure to do so may result in your appointment being rescheduled.   How to contact us:  If you have any questions/concerns before or after surgery, you can reach [place] at [phone], or you can send a mychart message. We can be reached by phone or mychart 8am-4pm, Monday-Friday.  *Please note: Calls after 4pm are forwarded to a third party answering service. Mychart messages are not routinely monitored during evenings, weekends, and holidays. Please call our office to contact the answering service for urgent concerns during non-business hours.    If you have FMLA/disability paperwork, please drop it off or fax it to [phone], attention Patty.   Appointments/FMLA & disability paperwork: Joycelyn Rua, & Flonnie Hailstone Registered Nurse/Surgery scheduler: Royston Cowper Medical Assistants: Nash Mantis Physician Assistants: Joan Flores, PA-C, Manning Charity, PA-C & Drake Leach, PA-C Surgeons: Venetia Night, MD & Ernestine Mcmurray, MD

## 2023-10-29 ENCOUNTER — Encounter: Payer: Self-pay | Admitting: *Deleted

## 2023-11-02 ENCOUNTER — Other Ambulatory Visit: Payer: Self-pay

## 2023-11-02 DIAGNOSIS — Z01818 Encounter for other preprocedural examination: Secondary | ICD-10-CM

## 2023-11-04 DIAGNOSIS — H26493 Other secondary cataract, bilateral: Secondary | ICD-10-CM | POA: Diagnosis not present

## 2023-11-04 DIAGNOSIS — H401122 Primary open-angle glaucoma, left eye, moderate stage: Secondary | ICD-10-CM | POA: Diagnosis not present

## 2023-11-04 DIAGNOSIS — Z8546 Personal history of malignant neoplasm of prostate: Secondary | ICD-10-CM | POA: Diagnosis not present

## 2023-11-04 DIAGNOSIS — Z961 Presence of intraocular lens: Secondary | ICD-10-CM | POA: Diagnosis not present

## 2023-11-04 DIAGNOSIS — H353231 Exudative age-related macular degeneration, bilateral, with active choroidal neovascularization: Secondary | ICD-10-CM | POA: Diagnosis not present

## 2023-11-04 DIAGNOSIS — Z7901 Long term (current) use of anticoagulants: Secondary | ICD-10-CM | POA: Diagnosis not present

## 2023-11-04 DIAGNOSIS — Z8673 Personal history of transient ischemic attack (TIA), and cerebral infarction without residual deficits: Secondary | ICD-10-CM | POA: Diagnosis not present

## 2023-11-05 ENCOUNTER — Encounter: Payer: Self-pay | Admitting: Gastroenterology

## 2023-11-10 NOTE — Telephone Encounter (Signed)
 I received a notification that Mr Mcchristian had not read this mychart message. I spoke with Mr Brensinger about my message. I answered any questions to the best of my ability. He inquired about range of motion. I explained that it may be limited some, but that most of the range of motion comes from C1-2, which we are not operating on. He asked about his previous hardware, which he informed me is in the front of his neck. I explained that if it is in the front of his neck, this surgery will be to put hardware in the back of his neck. I offered another appointment with Dr Clois (in person or telephone), but he declined at this time. He is aware that he can call us  back if he changes his mind.

## 2023-11-20 ENCOUNTER — Other Ambulatory Visit: Payer: Self-pay | Admitting: Internal Medicine

## 2023-11-22 NOTE — Telephone Encounter (Signed)
Prescription refill request for Eliquis received. Indication:afib Last office visit:7/24 Scr:0.93  1/25 Age: 78 Weight:82.2  kg  Prescription refilled

## 2023-11-23 ENCOUNTER — Telehealth: Payer: Self-pay | Admitting: Internal Medicine

## 2023-11-23 NOTE — Telephone Encounter (Signed)
   Pre-operative Risk Assessment    Patient Name: Ryan [name]  DOB: Apr 30, 1946 MRN: [id number]   Date of last office visit: 04/19/2023 Date of next office visit: none   Request for Surgical Clearance    Procedure:   C4-5 lamenectomy, C4-7  posterior spinal instrumentation  Date of Surgery:  Clearance 01/10/24                                Surgeon:  Dr. Venetia Night Surgeon's Group or Practice Name:  [facility] Phone number:  [phone] Fax number:  [phone]   Type of Clearance Requested:   - Medical  - Pharmacy:  Hold Aspirin and Apixaban (Eliquis) Hold 3 day prior and 14 days after   Type of Anesthesia:  General    Additional requests/questions:    Sharen Hones   11/23/2023, 12:33 PM

## 2023-11-24 DIAGNOSIS — Z8546 Personal history of malignant neoplasm of prostate: Secondary | ICD-10-CM | POA: Diagnosis not present

## 2023-11-24 DIAGNOSIS — C61 Malignant neoplasm of prostate: Secondary | ICD-10-CM | POA: Diagnosis not present

## 2023-11-24 NOTE — Telephone Encounter (Signed)
[name] and Dr. Ladona Ridgel,   Request is for Eliquis hold 3 days before and 14 days after C4-5 laminectomy and C4-7 posterior spinal instrumentation. Would just like to clarify that is okay?   Please route your response to P CV DIV Preop. I will communicate with requesting office once you have given recommendations.   Thank you!  Ryan Levering, NP

## 2023-11-24 NOTE — Telephone Encounter (Signed)
Patient with diagnosis of Afib on Eliquis for anticoagulation.    Procedure: C4-5 lamenectomy, C4-7  posterior spinal instrumentation  Date of procedure: 01/10/24   CHA2DS2-VASc Score = 7   This indicates a 11.2% annual risk of stroke. The patient's score is based upon: CHF History: 1 HTN History: 1 Diabetes History: 0 Stroke History: 2 Vascular Disease History: 1 Age Score: 2 Gender Score: 0     CrCl 78 mL/min Platelet count 211 K  Per office protocol, patient can hold Eliquis for 3 days prior to procedure.    **This guidance is not considered finalized until pre-operative APP has relayed final recommendations.**

## 2023-11-25 NOTE — Telephone Encounter (Signed)
3 days before is ok. Holding eliquis for more than 7 days will increased your risk of stroke.

## 2023-11-26 NOTE — Telephone Encounter (Signed)
   Name: Ryan [name]  DOB: 1946/07/11  MRN: [id number]  Primary Cardiologist: Lewayne Bunting, MD   Preoperative team, please contact this patient and set up a phone call appointment for further preoperative risk assessment. Please obtain consent and complete medication review. Thank you for your help. Last seen by Dr. Ladona Ridgel on 04/19/2023  I confirm that guidance regarding antiplatelet and oral anticoagulation therapy has been completed and, if necessary, noted below per Dr. Lewayne Bunting  3 days before is ok. Holding eliquis for more than 7 days will increased your risk of stroke.     I also confirmed the patient resides in the state of [place]. As per [facility] Medical Board telemedicine laws, the patient must reside in the state in which the provider is licensed.   Joni Reining, NP 11/26/2023, 8:00 AM [facility]

## 2023-11-28 ENCOUNTER — Other Ambulatory Visit: Payer: Self-pay | Admitting: Family Medicine

## 2023-11-28 DIAGNOSIS — R790 Abnormal level of blood mineral: Secondary | ICD-10-CM

## 2023-12-01 DIAGNOSIS — N401 Enlarged prostate with lower urinary tract symptoms: Secondary | ICD-10-CM | POA: Diagnosis not present

## 2023-12-01 DIAGNOSIS — E349 Endocrine disorder, unspecified: Secondary | ICD-10-CM | POA: Diagnosis not present

## 2023-12-01 DIAGNOSIS — N393 Stress incontinence (female) (male): Secondary | ICD-10-CM | POA: Diagnosis not present

## 2023-12-01 DIAGNOSIS — C61 Malignant neoplasm of prostate: Secondary | ICD-10-CM | POA: Diagnosis not present

## 2023-12-09 ENCOUNTER — Telehealth: Payer: Self-pay | Admitting: *Deleted

## 2023-12-09 NOTE — Telephone Encounter (Signed)
 Pt has been scheduled for a tele visit, 12/13/23 10:40.  Consent on file / medications reconciled.     Patient Consent for Virtual Visit        Ryan [name] has provided verbal consent on 12/09/2023 for a virtual visit (video or telephone).   CONSENT FOR VIRTUAL VISIT FOR:  Ryan [name]  By participating in this virtual visit I agree to the following:  I hereby voluntarily request, consent and authorize [facility] and its employed or contracted physicians, physician assistants, nurse practitioners or other licensed health care professionals (the Practitioner), to provide me with telemedicine health care services (the "Services") as deemed necessary by the treating Practitioner. I acknowledge and consent to receive the Services by the Practitioner via telemedicine. I understand that the telemedicine visit will involve communicating with the Practitioner through live audiovisual communication technology and the disclosure of certain medical information by electronic transmission. I acknowledge that I have been given the opportunity to request an in-person assessment or other available alternative prior to the telemedicine visit and am voluntarily participating in the telemedicine visit.  I understand that I have the right to withhold or withdraw my consent to the use of telemedicine in the course of my care at any time, without affecting my right to future care or treatment, and that the Practitioner or I may terminate the telemedicine visit at any time. I understand that I have the right to inspect all information obtained and/or recorded in the course of the telemedicine visit and may receive copies of available information for a reasonable fee.  I understand that some of the potential risks of receiving the Services via telemedicine include:  Delay or interruption in medical evaluation due to technological equipment failure or disruption; Information transmitted may not be sufficient  (e.g. poor resolution of images) to allow for appropriate medical decision making by the Practitioner; and/or  In rare instances, security protocols could fail, causing a breach of personal health information.  Furthermore, I acknowledge that it is my responsibility to provide information about my medical history, conditions and care that is complete and accurate to the best of my ability. I acknowledge that Practitioner's advice, recommendations, and/or decision may be based on factors not within their control, such as incomplete or inaccurate data provided by me or distortions of diagnostic images or specimens that may result from electronic transmissions. I understand that the practice of medicine is not an exact science and that Practitioner makes no warranties or guarantees regarding treatment outcomes. I acknowledge that a copy of this consent can be made available to me via my patient portal [facility] MyChart), or I can request a printed copy by calling the office of [facility].    I understand that my insurance will be billed for this visit.   I have read or had this consent read to me. I understand the contents of this consent, which adequately explains the benefits and risks of the Services being provided via telemedicine.  I have been provided ample opportunity to ask questions regarding this consent and the Services and have had my questions answered to my satisfaction. I give my informed consent for the services to be provided through the use of telemedicine in my medical care

## 2023-12-09 NOTE — Telephone Encounter (Signed)
 Pt has been scheduled for a tele visit, 12/13/23 10:40.  Consent on file / medications reconciled.

## 2023-12-13 ENCOUNTER — Ambulatory Visit: Attending: Physician Assistant | Admitting: Physician Assistant

## 2023-12-13 DIAGNOSIS — Z01810 Encounter for preprocedural cardiovascular examination: Secondary | ICD-10-CM | POA: Diagnosis not present

## 2023-12-13 NOTE — Progress Notes (Signed)
 Virtual Visit via Telephone Note   Because of Ryan [name] co-morbid illnesses, he is at least at moderate risk for complications without adequate follow up.  This format is felt to be most appropriate for this patient at this time.  Due to technical limitations with video connection (technology), today's appointment will be conducted as an audio only telehealth visit, and Ryan [name] verbally agreed to proceed in this manner.   All issues noted in this document were discussed and addressed.  No physical exam could be performed with this format.  Evaluation Performed:  Preoperative cardiovascular risk assessment _____________   Date:  12/13/2023   Patient ID:  Ryan [name], DOB 11/10/1945, MRN [id number] Patient Location:  Home Provider location:   Office  Primary Care Provider:  [name], MD Primary Cardiologist:  Ryan Bunting, MD  Chief Complaint / Patient Profile   78 y.o. 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 Myer [name] under general anesthesia and presents today for telephonic preoperative cardiovascular risk assessment.  Request is to hold aspirin and apixaban 3 days prior and 14 days after surgery.  Although, notes from 10/26/23 indicate that the patient can remain on ASA. Our Pharm.D. has reviewed the patient's history with recommendations per office protocol to hold apixaban for 3 days prior to procedure.  Ryan [name] has reviewed the patient's history  as well and recommended holding apixaban for no more than 7 days after surgery.  History of Present Illness    Ryan [name] is a 77 y.o. male who presents via audio/video conferencing for a telehealth visit today.  Pt was last seen in cardiology clinic on 04/19/2023 by Ryan [name].  At that time Ryan [name] noted symptoms of chest discomfort and shortness of breath.  Nuclear stress test was arranged in 04/2023.  This was low risk and negative for ischemia..  The patient is now pending procedure as outlined above. Since his last visit, he has done well without exertional chest discomfort.  He has residual sternal wound pain since his surgery.  It sounds as though he is describing a keloid.  He has chronic shortness of breath related to COPD.  This is unchanged.  He has dizziness related to his neck issue but no history of syncope.  CHA2DS2-VASc Score = 7 [CHF History: 1, HTN History: 1, Diabetes History: 0, Stroke History: 2, Vascular Disease History: 1, Age Score: 2, Gender Score: 0].  Therefore, the patient's annual risk of stroke is 11.2%.      Physical Exam    Vital Signs:  Ryan [name] does not have vital signs available for review today.  Given telephonic nature of communication, physical exam is limited. AAOx3. NAD. Normal affect.  Speech and respirations are unlabored.  Accessory Clinical Findings    None  Assessment & Plan    1.  Preoperative Cardiovascular Risk Assessment: [name] perioperative risk of a major cardiac event is 11% according to the Revised Cardiac Risk Index (RCRI).  Therefore, he is at high risk  for perioperative complications.   His functional capacity is good at 5.05 METs according to the Duke Activity Status Index (DASI). Recommendations: According to ACC/AHA guidelines, no further cardiovascular testing needed.  The patient may proceed to surgery at acceptable risk.   Antiplatelet and/or Anticoagulation Recommendations: -Eliquis (Apixaban) can be held  for 3 days prior to surgery.   -Recommendations for restarting Eliquis reviewed with Ryan [name]. Ryan [name] has recommended that the patient resume his Eliquis no later than 7 days after his surgery to reduce the risk of stroke. If resuming Eliquis 7 days post op is not acceptable, please contact our office.   A copy of this note will be routed to requesting surgeon.  Time:   Today, I have spent 11 minutes with the patient with telehealth technology discussing medical history, symptoms, and management plan.     Ryan Newcomer, PA-C 12/13/2023, 8:08 AM

## 2023-12-14 ENCOUNTER — Telehealth: Payer: Self-pay

## 2023-12-14 NOTE — Telephone Encounter (Signed)
 Dr. Ladona Ridgel Please see notes from Dr. Myer Haff. See your comments from phone note dated 11/23/23. You had recommended not holding Eliquis longer than 7 days. Dr. Myer Haff needs to hold at least 10 days. Please route your recommendations back to P CV DIV PREOP as well as Sharlot Gowda, RN. Tereso Newcomer, PA-C    12/14/2023 5:27 PM

## 2023-12-14 NOTE — Telephone Encounter (Signed)
 Message Received: Today Venetia Night, MD  Sharlot Gowda, RN Hold eliquis 10 days is the best we can do       Previous Messages    ----- Message ----- From: Sharlot Gowda, RN Sent: 12/14/2023  11:24 AM EDT To: Venetia Night, MD; Sharlot Gowda, RN  See note at bottom about resuming Eliquis 7 days post op - we requested 14 days post op. Thoughts? I have not contacted them since receiving this. ----- Message ----- From: Rockey Situ Sent: 12/14/2023   8:12 AM EDT To: Sharlot Gowda, RN  clearance

## 2023-12-16 DIAGNOSIS — H353231 Exudative age-related macular degeneration, bilateral, with active choroidal neovascularization: Secondary | ICD-10-CM | POA: Diagnosis not present

## 2023-12-22 NOTE — Telephone Encounter (Signed)
 Hello Dr. Ladona Ridgel,  Please review note from Tereso Newcomer, PA-C pertaining to patient's upcoming surgery.  Please provide recommendations.  Thank you for your help.  Please direct your response to CV DIV preop pool.  Thomasene Ripple. Alieah Brinton NP-C     12/22/2023, 11:14 AM [facility] [address] Office [phone] Fax [phone]

## 2023-12-22 NOTE — Telephone Encounter (Signed)
 I have no other rec's to make. Dr. Marcell Barlow is the surgeon and has say. My rec is as is. The risk of stroke is low regardless, just not zero.

## 2023-12-24 NOTE — Telephone Encounter (Signed)
 I updated his instructions and I spoke with Ryan [name] to update him. I also sent a mychart message.

## 2023-12-28 ENCOUNTER — Encounter
Admission: RE | Admit: 2023-12-28 | Discharge: 2023-12-28 | Disposition: A | Discharge status: Home / Self Care | Payer: Medicare Other | Source: Ambulatory Visit | Attending: Neurosurgery | Admitting: Neurosurgery

## 2023-12-28 ENCOUNTER — Other Ambulatory Visit: Payer: Self-pay

## 2023-12-28 VITALS — BP 148/88 | HR 74 | Temp 97.8°F | Resp 20 | Ht 64.0 in | Wt 182.5 lb

## 2023-12-28 DIAGNOSIS — I4891 Unspecified atrial fibrillation: Secondary | ICD-10-CM | POA: Insufficient documentation

## 2023-12-28 DIAGNOSIS — Z01818 Encounter for other preprocedural examination: Secondary | ICD-10-CM | POA: Insufficient documentation

## 2023-12-28 DIAGNOSIS — I2510 Atherosclerotic heart disease of native coronary artery without angina pectoris: Secondary | ICD-10-CM | POA: Diagnosis not present

## 2023-12-28 DIAGNOSIS — I5033 Acute on chronic diastolic (congestive) heart failure: Secondary | ICD-10-CM

## 2023-12-28 DIAGNOSIS — J439 Emphysema, unspecified: Secondary | ICD-10-CM | POA: Insufficient documentation

## 2023-12-28 DIAGNOSIS — Z951 Presence of aortocoronary bypass graft: Secondary | ICD-10-CM | POA: Diagnosis not present

## 2023-12-28 DIAGNOSIS — I5032 Chronic diastolic (congestive) heart failure: Secondary | ICD-10-CM | POA: Insufficient documentation

## 2023-12-28 DIAGNOSIS — Z01812 Encounter for preprocedural laboratory examination: Secondary | ICD-10-CM

## 2023-12-28 LAB — CBC
HCT: 35.5 % — ABNORMAL LOW (ref 39.0–52.0)
Hemoglobin: 12.1 g/dL — ABNORMAL LOW (ref 13.0–17.0)
MCH: 31.3 pg (ref 26.0–34.0)
MCHC: 34.1 g/dL (ref 30.0–36.0)
MCV: 91.7 fL (ref 80.0–100.0)
Platelets: 192 10*3/uL (ref 150–400)
RBC: 3.87 MIL/uL — ABNORMAL LOW (ref 4.22–5.81)
RDW: 14 % (ref 11.5–15.5)
WBC: 9.7 10*3/uL (ref 4.0–10.5)
nRBC: 0 % (ref 0.0–0.2)

## 2023-12-28 LAB — BASIC METABOLIC PANEL
Anion gap: 8 (ref 5–15)
BUN: 28 mg/dL — ABNORMAL HIGH (ref 8–23)
CO2: 26 mmol/L (ref 22–32)
Calcium: 9.4 mg/dL (ref 8.9–10.3)
Chloride: 105 mmol/L (ref 98–111)
Creatinine, Ser: 0.89 mg/dL (ref 0.61–1.24)
GFR, Estimated: >60 mL/min (ref >60)
Glucose, Bld: 98 mg/dL (ref 70–99)
Potassium: 4.6 mmol/L (ref 3.5–5.1)
Sodium: 139 mmol/L (ref 135–145)

## 2023-12-28 LAB — TYPE AND SCREEN
ABO/RH(D): O POS
Antibody Screen: NEGATIVE

## 2023-12-28 LAB — SURGICAL PCR SCREEN
MRSA, PCR: NEGATIVE
Staphylococcus aureus: NEGATIVE

## 2023-12-28 NOTE — Patient Instructions (Addendum)
 Your procedure is scheduled on: Monday 01/10/24  Report to the Registration Desk on the 1st floor of the [facility]. To find out your arrival time, please call [phone] between 1PM - 3PM on: Friday 01/07/24 If your arrival time is 6:00 am, do not arrive before that time as the [facility] entrance doors do not open until 6:00 am.  REMEMBER: Instructions that are not followed completely may result in serious medical risk, up to and including death; or upon the discretion of your surgeon and anesthesiologist your surgery may need to be rescheduled.  Do not eat food after midnight the night before surgery.  No gum chewing or hard candies.  You may however, drink CLEAR liquids up to 2 hours before you are scheduled to arrive for your surgery. Do not drink anything within 2 hours of your scheduled arrival time.  Clear liquids include: - water  - apple juice without pulp - gatorade (not RED colors) - black coffee or tea (Do NOT add milk or creamers to the coffee or tea) Do NOT drink anything that is not on this list.   One week prior to surgery: Stop Anti-inflammatories (NSAIDS) such as Advil, Aleve, Ibuprofen, Motrin, Naproxen, Naprosyn and Aspirin based products such as Excedrin, Goody's Powder, BC Powder. Stop ANY OVER THE COUNTER supplements until after surgery. cholecalciferol (VITAMIN D3) 25 MCG  magnesium oxide (MAG-OX) 400 (240 Mg) MG   You may however, continue to take Tylenol if needed for pain up until the day of surgery.   Stop ELIQUIS 5 MG TABS 3 days before your surgery (take last dose Thursday 01/06/24)  Continue taking all of your other prescription medications up until the day of surgery.  ON THE DAY OF SURGERY ONLY TAKE THESE MEDICATIONS WITH SIPS OF WATER:  metoprolol tartrate (LOPRESSOR) 100 MG  omeprazole (PRILOSEC OTC) 20 MG  tamsulosin (FLOMAX) 0.4 MG   Use inhalers on the day of surgery and bring to the hospital. levalbuterol (XOPENEX HFA) 45 MCG/ACT  inhaler   No Alcohol for 24 hours before or after surgery.  No Smoking including e-cigarettes for 24 hours before surgery.  No chewable tobacco products for at least 6 hours before surgery.  No nicotine patches on the day of surgery.  Do not use any "recreational" drugs for at least a week (preferably 2 weeks) before your surgery.  Please be advised that the combination of cocaine and anesthesia may have negative outcomes, up to and including death. If you test positive for cocaine, your surgery will be cancelled.  On the morning of surgery brush your teeth with toothpaste and water, you may rinse your mouth with mouthwash if you wish. Do not swallow any toothpaste or mouthwash.  Use CHG Soap as directed on instruction sheet.  Do not wear jewelry, make-up, hairpins, clips or nail polish.  For welded (permanent) jewelry: bracelets, anklets, waist bands, etc.  Please have this removed prior to surgery.  If it is not removed, there is a chance that hospital personnel will need to cut it off on the day of surgery.  Do not wear lotions, powders, or perfumes.   Do not shave body hair from the neck down 48 hours before surgery.  Contact lenses, hearing aids and dentures may not be worn into surgery.  Do not bring valuables to the hospital. [facility] is not responsible for any missing/lost belongings or valuables.   Total Shoulder Arthroplasty:  use Benzoyl Peroxide 5% Gel as directed on instruction sheet.  Bring your C-PAP to the hospital in case you may have to spend the night.   Notify your doctor if there is any change in your medical condition (cold, fever, infection).  Wear comfortable clothing (specific to your surgery type) to the hospital.  After surgery, you can help prevent lung complications by doing breathing exercises.  Take deep breaths and cough every 1-2 hours. Your doctor may order a device called an Incentive Spirometer to help you take deep breaths. When coughing  or sneezing, hold a pillow firmly against your incision with both hands. This is called "splinting." Doing this helps protect your incision. It also decreases belly discomfort.  If you are being admitted to the hospital overnight, leave your suitcase in the car. After surgery it may be brought to your room.  In case of increased patient census, it may be necessary for you, the patient, to continue your postoperative care in the Same Day Surgery department.  If you are being discharged the day of surgery, you will not be allowed to drive home. You will need a responsible individual to drive you home and stay with you for 24 hours after surgery.   If you are taking public transportation, you will need to have a responsible individual with you.  Please call the Pre-admissions Testing Dept. at [phone] if you have any questions about these instructions.  Surgery Visitation Policy:  Patients having surgery or a procedure may have two visitors.  Children under the age of 7 must have an adult with them who is not the patient.  Temporary Visitor Restrictions Due to increasing cases of flu, RSV and COVID-19: Children ages 27 and under will not be able to visit patients in [facility] hospitals under most circumstances.  Inpatient Visitation:    Visiting hours are 7 a.m. to 8 p.m. Up to four visitors are allowed at one time in a patient room. The visitors may rotate out with other people during the day.  One visitor age 25 or older may stay with the patient overnight and must be in the room by 8 p.m.  Pre-operative 5 CHG Bath Instructions   You can play a key role in reducing the risk of infection after surgery. Your skin needs to be as free of germs as possible. You can reduce the number of germs on your skin by washing with CHG (chlorhexidine gluconate) soap before surgery. CHG is an antiseptic soap that kills germs and continues to kill germs even after washing.   DO NOT use if you  have an allergy to chlorhexidine/CHG or antibacterial soaps. If your skin becomes reddened or irritated, stop using the CHG and notify one of our RNs at [phone].   Please shower with the CHG soap starting 4 days before surgery using the following schedule:     Please keep in mind the following:  DO NOT shave, including legs and underarms, starting the day of your first shower.   You may shave your face at any point before/day of surgery.  Place clean sheets on your bed the day you start using CHG soap. Use a clean washcloth (not used since being washed) for each shower. DO NOT sleep with pets once you start using the CHG.   CHG Shower Instructions:  If you choose to wash your hair and private area, wash first with your normal shampoo/soap.  After you use shampoo/soap, rinse your hair and body thoroughly to remove shampoo/soap residue.  Turn the water OFF and apply  about 3 tablespoons (45 ml) of CHG soap to a CLEAN washcloth.  Apply CHG soap ONLY FROM YOUR NECK DOWN TO YOUR TOES (washing for 3-5 minutes)  DO NOT use CHG soap on face, private areas, open wounds, or sores.  Pay special attention to the area where your surgery is being performed.  If you are having back surgery, having someone wash your back for you may be helpful. Wait 2 minutes after CHG soap is applied, then you may rinse off the CHG soap.  Pat dry with a clean towel  Put on clean clothes/pajamas   If you choose to wear lotion, please use ONLY the CHG-compatible lotions on the back of this paper.     Additional instructions for the day of surgery: DO NOT APPLY any lotions, deodorants, cologne, or perfumes.   Put on clean/comfortable clothes.  Brush your teeth.  Ask your nurse before applying any prescription medications to the skin.      CHG Compatible Lotions   Aveeno Moisturizing lotion  Cetaphil Moisturizing Cream  Cetaphil Moisturizing Lotion  Clairol Herbal Essence Moisturizing Lotion, Dry Skin   Clairol Herbal Essence Moisturizing Lotion, Extra Dry Skin  Clairol Herbal Essence Moisturizing Lotion, Normal Skin  Curel Age Defying Therapeutic Moisturizing Lotion with Alpha Hydroxy  Curel Extreme Care Body Lotion  Curel Soothing Hands Moisturizing Hand Lotion  Curel Therapeutic Moisturizing Cream, Fragrance-Free  Curel Therapeutic Moisturizing Lotion, Fragrance-Free  Curel Therapeutic Moisturizing Lotion, Original Formula  Eucerin Daily Replenishing Lotion  Eucerin Dry Skin Therapy Plus Alpha Hydroxy Crme  Eucerin Dry Skin Therapy Plus Alpha Hydroxy Lotion  Eucerin Original Crme  Eucerin Original Lotion  Eucerin Plus Crme Eucerin Plus Lotion  Eucerin TriLipid Replenishing Lotion  Keri Anti-Bacterial Hand Lotion  Keri Deep Conditioning Original Lotion Dry Skin Formula Softly Scented  Keri Deep Conditioning Original Lotion, Fragrance Free Sensitive Skin Formula  Keri Lotion Fast Absorbing Fragrance Free Sensitive Skin Formula  Keri Lotion Fast Absorbing Softly Scented Dry Skin Formula  Keri Original Lotion  Keri Skin Renewal Lotion Keri Silky Smooth Lotion  Keri Silky Smooth Sensitive Skin Lotion  Nivea Body Creamy Conditioning Oil  Nivea Body Extra Enriched Teacher, adult education Moisturizing Lotion Nivea Crme  Nivea Skin Firming Lotion  NutraDerm 30 Skin Lotion  NutraDerm Skin Lotion  NutraDerm Therapeutic Skin Cream  NutraDerm Therapeutic Skin Lotion  ProShield Protective Hand Cream  Provon moisturizing lotion

## 2024-01-01 ENCOUNTER — Emergency Department (HOSPITAL_BASED_OUTPATIENT_CLINIC_OR_DEPARTMENT_OTHER): Admitting: Radiology

## 2024-01-01 ENCOUNTER — Emergency Department (HOSPITAL_BASED_OUTPATIENT_CLINIC_OR_DEPARTMENT_OTHER)

## 2024-01-01 ENCOUNTER — Emergency Department (HOSPITAL_BASED_OUTPATIENT_CLINIC_OR_DEPARTMENT_OTHER)
Admission: EM | Admit: 2024-01-01 | Discharge: 2024-01-01 | Disposition: A | Discharge status: Home / Self Care | Attending: Emergency Medicine | Admitting: Emergency Medicine

## 2024-01-01 ENCOUNTER — Other Ambulatory Visit: Payer: Self-pay

## 2024-01-01 DIAGNOSIS — Z951 Presence of aortocoronary bypass graft: Secondary | ICD-10-CM | POA: Insufficient documentation

## 2024-01-01 DIAGNOSIS — I2510 Atherosclerotic heart disease of native coronary artery without angina pectoris: Secondary | ICD-10-CM | POA: Insufficient documentation

## 2024-01-01 DIAGNOSIS — I6789 Other cerebrovascular disease: Secondary | ICD-10-CM | POA: Diagnosis not present

## 2024-01-01 DIAGNOSIS — M47812 Spondylosis without myelopathy or radiculopathy, cervical region: Secondary | ICD-10-CM | POA: Diagnosis not present

## 2024-01-01 DIAGNOSIS — Z981 Arthrodesis status: Secondary | ICD-10-CM | POA: Diagnosis not present

## 2024-01-01 DIAGNOSIS — Z7982 Long term (current) use of aspirin: Secondary | ICD-10-CM | POA: Diagnosis not present

## 2024-01-01 DIAGNOSIS — R0789 Other chest pain: Secondary | ICD-10-CM | POA: Insufficient documentation

## 2024-01-01 DIAGNOSIS — M4802 Spinal stenosis, cervical region: Secondary | ICD-10-CM | POA: Diagnosis not present

## 2024-01-01 DIAGNOSIS — S59901A Unspecified injury of right elbow, initial encounter: Secondary | ICD-10-CM | POA: Diagnosis present

## 2024-01-01 DIAGNOSIS — W228XXA Striking against or struck by other objects, initial encounter: Secondary | ICD-10-CM | POA: Insufficient documentation

## 2024-01-01 DIAGNOSIS — S50311A Abrasion of right elbow, initial encounter: Secondary | ICD-10-CM | POA: Diagnosis not present

## 2024-01-01 DIAGNOSIS — Z7901 Long term (current) use of anticoagulants: Secondary | ICD-10-CM | POA: Diagnosis not present

## 2024-01-01 DIAGNOSIS — S0990XA Unspecified injury of head, initial encounter: Secondary | ICD-10-CM | POA: Diagnosis not present

## 2024-01-01 DIAGNOSIS — M25521 Pain in right elbow: Secondary | ICD-10-CM | POA: Diagnosis not present

## 2024-01-01 DIAGNOSIS — G319 Degenerative disease of nervous system, unspecified: Secondary | ICD-10-CM | POA: Diagnosis not present

## 2024-01-01 DIAGNOSIS — R079 Chest pain, unspecified: Secondary | ICD-10-CM | POA: Diagnosis not present

## 2024-01-01 DIAGNOSIS — W19XXXA Unspecified fall, initial encounter: Secondary | ICD-10-CM

## 2024-01-01 DIAGNOSIS — Z043 Encounter for examination and observation following other accident: Secondary | ICD-10-CM | POA: Diagnosis not present

## 2024-01-01 DIAGNOSIS — S12400A Unspecified displaced fracture of fifth cervical vertebra, initial encounter for closed fracture: Secondary | ICD-10-CM | POA: Diagnosis not present

## 2024-01-01 DIAGNOSIS — T148XXA Other injury of unspecified body region, initial encounter: Secondary | ICD-10-CM

## 2024-01-01 MED ORDER — BACITRACIN ZINC 500 UNIT/GM EX OINT
TOPICAL_OINTMENT | Freq: Two times a day (BID) | CUTANEOUS | Status: DC
  Administered 2024-01-01: 31.5 via TOPICAL
  Filled 2024-01-01: qty 28.35

## 2024-01-01 MED ORDER — LIDOCAINE 5 % EX PTCH
1.0000 | MEDICATED_PATCH | CUTANEOUS | Status: DC
  Administered 2024-01-01: 1 via TRANSDERMAL
  Filled 2024-01-01: qty 1

## 2024-01-01 MED ORDER — LIDOCAINE 5 % EX PTCH: 1.0000 | MEDICATED_PATCH | CUTANEOUS | 0 refills | Status: DC

## 2024-01-01 NOTE — ED Provider Notes (Signed)
 [place] EMERGENCY DEPARTMENT AT [facility] Provider Note   CSN: [id number] Arrival date & time: 01/01/24  4098     History  No chief complaint on file.   Ryan [name] is a 78 y.o. male history of nonsustained V. tach, CABG x 4, aortic atherosclerosis, CAD, A-fib on Eliquis, status post lumbar laminectomy 2024 scheduled for cervical laminectomy next month presented after a fall that occurred 2 days ago.  Patient states he has been struggling for over a year now and when he was walking the hall he started stumbling again.  Patient states he is post to have his laminectomy done at the C4-C5 level with his neurosurgeon to help with the stumbling.  Patient's been taking his Eliquis and states that when he stumbled he hit his head and right arm on the wall and then slid down and hit his right side ribs.  Patient states his rib cage is tender to palpation but denies any abdominal pain nausea vomiting chest pain or shortness of breath.  Patient tried Tylenol for 6 hours which seems to help.  Patient states he has an abrasion on his right elbow which they cleaned it out and have been keeping it wrapped.  Patient has any fevers or redness or pus from this area.  Patient notes his last tetanus was 3 years ago.  Patient has been walking since then at his baseline.  Patient denies new onset weakness, paresthesias, dysuria or hematuria, back pain, inability to walk  Home Medications Prior to Admission medications   Medication Sig Start Date End Date Taking? Authorizing Provider  lidocaine (LIDODERM) 5 % Place 1 patch onto the skin daily. Remove & Discard patch within 12 hours or as directed by MD 01/01/24  Yes Silva Aamodt, Beverly Gust, PA-C  acetaminophen (TYLENOL) 500 MG tablet Take 1,000 mg by mouth at bedtime.    [provider]  aspirin EC 81 MG EC tablet Take 1 tablet (81 mg total) by mouth daily. Swallow whole. 08/01/21   Roddenberry, Cecille Amsterdam, PA-C  budesonide (ENTOCORT EC) 3 MG 24 hr  capsule TAKE 2 CAPSULES(6 MG) BY MOUTH DAILY 10/14/23   Cirigliano, Vito V, DO  cholecalciferol (VITAMIN D3) 25 MCG (1000 UNIT) tablet Take 10,000 Units by mouth 2 (two) times a week. Monday and Friday    [provider]  diazepam (VALIUM) 5 MG tablet TAKE 1/2 TO 2 TABLETS(2.5 TO 10 MG) BY MOUTH EVERY 12 HOURS AS NEEDED FOR ANXIETY 06/28/23   [name], MD  diphenoxylate-atropine (LOMOTIL) 2.5-0.025 MG tablet Take 1 tablet by mouth 4 (four) times daily as needed for diarrhea or loose stools.    [provider]  ELIQUIS 5 MG TABS tablet TAKE 1 TABLET BY MOUTH TWICE  DAILY 11/22/23   Marinus Maw, MD  Evolocumab (REPATHA SURECLICK) 140 MG/ML SOAJ Inject 140 mg into the skin every 14 (fourteen) days. 03/10/23   Marinus Maw, MD  furosemide (LASIX) 20 MG tablet TAKE 1 TABLET BY MOUTH DAILY AS  NEEDED 12/22/21   [name], MD  latanoprost (XALATAN) 0.005 % ophthalmic solution Place 1 drop into the left eye at bedtime.     [provider]  levalbuterol Pauline Aus HFA) 45 MCG/ACT inhaler Inhale 2 puffs into the lungs every 4 (four) hours as needed for wheezing or shortness of breath. 08/02/23   [name], MD  magnesium oxide (MAG-OX) 400 (240 Mg) MG tablet TAKE 1 TABLET(400 MG) BY MOUTH THREE TIMES DAILY 11/29/23  [name], MD  metoprolol tartrate (LOPRESSOR) 100 MG tablet TAKE 1 TABLET BY MOUTH TWICE  DAILY 02/15/23   Marinus Maw, MD  Multiple Vitamins-Minerals (PRESERVISION AREDS 2) CAPS Take 1 capsule by mouth daily.    [provider]  nitroGLYCERIN (NITROSTAT) 0.4 MG SL tablet Place 1 tablet (0.4 mg total) under the tongue every 5 (five) minutes as needed for chest pain. 04/14/20   Drema Dallas, MD  omeprazole (PRILOSEC OTC) 20 MG tablet Take 20 mg by mouth every morning.    [provider]  Ranibizumab (LUCENTIS IO) Inject 1 Dose into the eye every 6 (six) weeks. Bilateral eye by Dr Marvis Repress    [provider]   tamsulosin (FLOMAX) 0.4 MG CAPS capsule Take 0.4 mg by mouth 2 (two) times daily.    [provider]      Allergies    Albumin (human), Guaifenesin, Polymyxin b-trimethoprim, Pseudoephedrine, Codeine, Guaiacol, Polymyxin b, Statins, Atorvastatin, Brimonidine tartrate, Gabapentin, Meloxicam, Peppermint flavoring agent (non-screening), Pseudoephedrine-guaifenesin, Pseudoephedrine-guaifenesin, Rosuvastatin calcium, Rosuvastatin calcium, Simvastatin, Tapentadol, Ciprofloxacin, Moxifloxacin, and Rofecoxib    Review of Systems   Review of Systems  Physical Exam Updated Vital Signs BP (!) 169/87 (BP Location: Right Arm)   Pulse 76   Temp (!) 97 F (36.1 C)   Resp 16   SpO2 99%  Physical Exam Vitals reviewed.  Constitutional:      General: He is not in acute distress. HENT:     Head: Normocephalic and atraumatic.  Eyes:     Extraocular Movements: Extraocular movements intact.     Conjunctiva/sclera: Conjunctivae normal.     Pupils: Pupils are equal, round, and reactive to light.  Cardiovascular:     Rate and Rhythm: Normal rate and regular rhythm.     Pulses: Normal pulses.     Heart sounds: Normal heart sounds.     Comments: 2+ bilateral radial/dorsalis pedis pulses with regular rate Pulmonary:     Effort: Pulmonary effort is normal. No respiratory distress.     Breath sounds: Normal breath sounds.  Abdominal:     Palpations: Abdomen is soft.     Tenderness: There is no abdominal tenderness. There is no guarding or rebound.  Musculoskeletal:        General: Normal range of motion.     Cervical back: Normal range of motion and neck supple.     Comments: 5 out of 5 bilateral grip/leg extension strength Tenderness to right chest wall without bony abnormalities or skin color changes noted No crepitus noted  Skin:    General: Skin is warm and dry.     Capillary Refill: Capillary refill takes less than 2 seconds.     Comments: Abrasion noted to right extensor side elbow  without drainage, surrounding erythema, warmth  Neurological:     General: No focal deficit present.     Mental Status: He is alert and oriented to person, place, and time.     Comments: Sensation intact in all 4 limbs  Psychiatric:        Mood and Affect: Mood normal.     ED Results / Procedures / Treatments   Labs (all labs ordered are listed, but only abnormal results are displayed) Labs Reviewed - No data to display  EKG None  Radiology DG ELBOW COMPLETE RIGHT (3+VIEW) Result Date: 01/01/2024 CLINICAL DATA:  Fall onto right elbow.  Elbow pain with abrasions. EXAM: RIGHT ELBOW - COMPLETE 3+ VIEW COMPARISON:  None Available. FINDINGS: The  mineralization and alignment are normal. There is no evidence of acute fracture or dislocation. The joint spaces are preserved. No evidence of elbow joint effusion. There is mild spurring of the lateral humeral epicondyle. No evidence of foreign body or soft tissue emphysema. IMPRESSION: No evidence of acute fracture or dislocation. Mild spurring of the lateral humeral epicondyle. Electronically Signed   By: Carey Bullocks M.D.   On: 01/01/2024 10:31   DG Ribs Unilateral W/Chest Right Result Date: 01/01/2024 CLINICAL DATA:  Status post fall.  Right-sided chest pain. EXAM: RIGHT RIBS AND CHEST - 3+ VIEW COMPARISON:  10/02/2022 FINDINGS: Previous median sternotomy and CABG procedure. Stable cardiomediastinal contours. No pleural fluid, interstitial edema or airspace consolidation. No pneumothorax. No rib fractures identified. Postoperative changes within the lower cervical spine. Chronic coarsened interstitial markings appear similar to previous exam. IMPRESSION: 1. No acute findings.  No rib fracture identified. 2. Chronic coarsened interstitial markings. Electronically Signed   By: Signa Kell M.D.   On: 01/01/2024 10:29   CT Cervical Spine Wo Contrast Result Date: 01/01/2024 CLINICAL DATA:  Status post fall.  On blood thinners. EXAM: CT CERVICAL  SPINE WITHOUT CONTRAST TECHNIQUE: Multidetector CT imaging of the cervical spine was performed without intravenous contrast. Multiplanar CT image reconstructions were also generated. RADIATION DOSE REDUCTION: This exam was performed according to the departmental dose-optimization program which includes automated exposure control, adjustment of the mA and/or kV according to patient size and/or use of iterative reconstruction technique. COMPARISON:  Cervical MRI 10/11/2023.  Chest CT 01/17/2022. FINDINGS: Alignment: Stable minimal retrolisthesis at C4-5. Skull base and vertebrae: No evidence of acute fracture or traumatic subluxation. Status post C5-7 ACDF with an anterior plate and screws. The left C5 screw is fractured. No hardware loosening or displacement identified. No solid interbody fusion identified at C5-6. Fusion appears solid at C6-7. Soft tissues and spinal canal: No prevertebral fluid or swelling. No visible canal hematoma. 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 Moderate asymmetric right foraminal narrowing also noted at C7-T1, similar to previous MRI. 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 This is new compared with the previous chest CT although likely represents a granuloma based on its calcified nature. Bilateral carotid atherosclerosis. Other: None. IMPRESSION: 1. No evidence of acute cervical spine fracture, traumatic subluxation or static signs of instability. 2. Status post C5-7 ACDF with fracture of the left C5 screw. No solid interbody fusion identified at C5-6. 3. 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 4. Partially calcified left apical pulmonary  nodule, new compared with previous chest CT although likely a granuloma based on its calcified nature. Electronically Signed   By: Carey Bullocks M.D.   On: 01/01/2024 10:12   CT Head Wo Contrast Result Date: 01/01/2024 CLINICAL DATA:  Status post fall. EXAM: CT HEAD WITHOUT CONTRAST TECHNIQUE: Contiguous axial images were obtained from the base of the skull through the vertex without intravenous contrast. RADIATION DOSE REDUCTION: This exam was performed according to the departmental dose-optimization program which includes automated exposure control, adjustment of the mA and/or kV according to patient size and/or use of iterative reconstruction technique. COMPARISON:  03/27/2021 FINDINGS: Brain: No evidence of acute infarction, hemorrhage, hydrocephalus, extra-axial collection or mass lesion/mass effect. There is mild diffuse low-attenuation within  the subcortical and periventricular white matter compatible with chronic microvascular disease. Prominence of the sulci and ventricles noted. Vascular: No hyperdense vessel or unexpected calcification. Skull: Normal. Negative for fracture or focal lesion. Sinuses/Orbits: No acute finding. Other: None. IMPRESSION: 1. No acute intracranial abnormalities. 2. Chronic microvascular disease and cerebral atrophy. Electronically Signed   By: Signa Kell M.D.   On: 01/01/2024 10:12    Procedures Procedures    Medications Ordered in ED Medications  bacitracin ointment (31.5 Applications Topical Given 01/01/24 1009)  lidocaine (LIDODERM) 5 % 1 patch (1 patch Transdermal Patch Applied 01/01/24 1009)    ED Course/ Medical Decision Making/ A&P                                 Medical Decision Making Amount and/or Complexity of Data Reviewed Radiology: ordered.  Risk OTC drugs. Prescription drug management.   Ryan [name] 78 y.o. presented today for fall. Working DDx that I considered at this time includes, but not limited to, vasovagal episode,  mechanical fall, ICH, epidural/subdural hematoma, basilar skull fracture, anemia, electrolyte abnormalities, drug-induced, arrhythmia, UTI, fracture, contusion, soft tissue injury.  R/o DDx: vasovagal episode, ICH, epidural/subdural hematoma, basilar skull fracture, anemia, electrolyte abnormalities, drug-induced, arrhythmia, UTI, fracture: These are considered less likely due to history of present illness, physical exam, lab/imaging findings  Review of prior external notes: 12/28/23 pre-admission  Unique Tests and My Independent Interpretation:  CT Head without contrast: No acute pathology CT Cervical spine without contrast: Likely granuloma noted in right apical region, granulomas likely secondary to having and she mentation in his cervical region Right rib x-ray: Unremarkable Right elbow x-ray: Unremarkable  Social Determinants of Health: none  Discussion with Independent Historian:  Wife  Discussion of Management of Tests: None  Risk: Medium: prescription drug management  Risk Stratification Score: None  Staffed with Freida Busman, MD  Plan: On exam patient was no acute distress with stable vitals.  On exam patient does have abrasion noted to right elbow on the extensor side that does not appear infected and so we will clean this and place bacitracin ointment.  Patient is no midline tenderness or neurologic deficits and so at this time do feel he may have bumped his head but since he is on Eliquis we will get CT imaging.  Patient also did have right chest wall tenderness without shortness of breath, adventitious lung sounds, murmurs, abdominal tenderness or peritoneal signs and so we will get x-ray however do think this is MSK in nature and will give him a lidocaine patch as well.  Imaging negative.  Will prescribe lidocaine patches to him follow-up with his neurosurgeon along with primary care provider.  Patient was given return precautions. Patient stable for discharge at this time.   Patient verbalized understanding of plan.  This chart was dictated using voice recognition software.  Despite best efforts to proofread,  errors can occur which can change the documentation meaning.         Final Clinical Impression(s) / ED Diagnoses Final diagnoses:  Fall, initial encounter  Abrasion    Rx / DC Orders ED Discharge Orders          Ordered    lidocaine (LIDODERM) 5 %  Every 24 hours        01/01/24 1044              Remi Deter 01/01/24 1047    Freida Busman,  Ethelene Browns, MD 01/02/24 [phone]

## 2024-01-01 NOTE — Discharge Instructions (Addendum)
 We saw you in the ER after you had a fall. All the imaging results are normal, no fractures seen. No evidence of brain bleed. Please be very careful with walking, and do everything possible to prevent falls. Please follow-up with your neurosurgeon to have your scheduled surgery.  Please continue take your medications as prescribed. Please follow up with your primary care provider in the next week to be re-evaluated and ensure you are improving. If symptoms change or worsen, please return to ER.

## 2024-01-01 NOTE — ED Triage Notes (Signed)
 Pt is to receive surgery on c4 next week, he has been stumbling with ambulation. On Thursday he stumbled in his hallway, hit his right armon the wall, and then slid down the wall and fell and hit his right ribs. Pain in right ribs. Pt is on eliquis

## 2024-01-01 NOTE — ED Notes (Signed)
 Patient discharged to home alert, oriented and self-ambulatory after review of written discharge instructions; medication reviewed, patient to follow up with PCP, neurology; return to ED for new or worsening symptoms.

## 2024-01-01 NOTE — ED Provider Notes (Signed)
 I provided a substantive portion of the care of this patient.  I personally made/approved the management plan for this patient and take responsibility for the patient management.       78 year old male who presents after having a fall several days ago.  Head CT and x-rays were negative.  Will discharge   Lorre Nick, MD 01/01/24 1046

## 2024-01-01 NOTE — ED Notes (Signed)
 Skin tear to right elbow irrigated with sterile saline, dressed with bacitracin and kerlex, secured with coban.  Wound care instructions reviewed.

## 2024-01-01 NOTE — ED Notes (Signed)
 Patient returned from radiology

## 2024-01-03 ENCOUNTER — Telehealth: Payer: Self-pay | Admitting: Neurosurgery

## 2024-01-03 NOTE — Telephone Encounter (Signed)
 C4-5 laminectomy, C4-7 posterior spinal instrumentation on 01/10/24  Patient fell and went to the ED on 01/01/2024. She scraped his arm and it pulled his skin off. Will this affect his surgery?

## 2024-01-03 NOTE — Telephone Encounter (Signed)
 I called [name] to check on him and let him know that we plan to proceed with his surgery on 01/10/2024.   He states that he is okay but a little sore after his fall. He is glad we are able to proceed with surgery.

## 2024-01-04 ENCOUNTER — Telehealth: Payer: Self-pay

## 2024-01-04 NOTE — Progress Notes (Signed)
 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 Clinical nursing staff has updated patient's PMH/PSHx, current medication list, and drug allergies/intolerances to ensure comprehensive history available to assist in medical decision making as it pertains to the aforementioned surgical procedure and anticipated anesthetic course. Extensive review of available clinical information personally performed. [place] PM[facility] and PSHx updated with any diagnoses/procedures that  may have been inadvertently omitted during his intake with the pre-admission testing department's nursing staff.  Clinical Discussion:  Ryan [name] is a 78 y.o. male who is submitted for pre-surgical anesthesia review and clearance prior to him undergoing the above procedure. Patient is a Former Smoker (25 pack years; quit 04/2009). Pertinent PMH includes: CAD (s/p CABG), atrial fibrillation, CHF, TIA, moderate small vessel cerebrovascular disease, NSVT, BILATERAL carotid artery disease, aortic atherosclerosis, cardiac murmur, RBBB, HTN, HLD, CKD-III, COPD, DOE, GERD (on daily PPI), hiatal hernia, nephrolithiasis, glaucoma, prostate cancer (s/p TURP + XRT + LT-ADT), anemia, cervical DDD (s/p ACDF C5-C7), lumbar stenosis with associated neurogenic claudication, SNHL, anxiety (on BZO PRN).   Patient is  followed by cardiology Ladona Ridgel,  MD). He was last seen in the cardiology clinic on 04/19/2023; notes reviewed. At the time of his clinic visit, patient complaining of worsening shortness of breath and pain in his lower extremities.  He denied any chest pain PND, orthopnea, palpitations, significant peripheral edema, weakness, fatigue, vertiginous symptoms, or presyncope/syncope. Patient with a past medical history significant for cardiovascular diagnoses. Documented physical exam was grossly benign, providing no evidence of acute exacerbation and/or decompensation of the patient's known cardiovascular conditions.  Coronary CTA was performed on 03/26/2011 revealing elevated coronary calcium score 505.  Coronary calcium was concentrated in the proximal RCA and LM territories.  Diagnostic LEFT heart catheterization on 04/01/2011 revealing multivessel CAD; 40-50% ostial LM, 60-70% mid to distal LAD, 30% mid D2, and 60% proximal RCA.  Interventional cardiology made the decision to defer intervention opting for aggressive medical management.  Patient presented with neurological symptoms concerning for acute stroke on 03/23/2021.  Patient experiencing new onset weakness in his LEFT lower extremity.    CT imaging of the head revealed no acute intracranial abnormalities.  Follow-up MRI imaging of the brain without contrast demonstrated moderate/severe stenosis within the V4 RIGHT vertebral artery proximal to the RIGHT PICA origin.  There was moderate cerebral small vessel ischemic disease noted.  Patient was seen in consult by neurology and CVA was ruled out.  Symptoms and imaging consistent with TIA.  Patient has no residual deficits following neurological event.  BILATERAL carotid artery Doppler study performed on 03/24/2021 revealing 1-39% stenosis of the BILATERAL internal carotid arteries.  Vertebral arteries demonstrate antegrade flow.  There were normal flow hemodynamics in the BILATERAL  subclavians.  Diagnostic LEFT heart catheterization on 07/21/2021 revealing multivessel CAD; 90% ostial LM,  60% ostial-proximal LCx, 95% proximal RCA, 70% distal RCA, and 70% RPDA.  Given the degree and complexity of patient's coronary artery disease, patient was referred to CVTS for consideration of coronary revascularization.  Patient underwent four-vessel revascularization on 07/22/2021.  LIMA-LAD, SVG-OM1, and sequential SVG-acute marginal-PDA bypass grafts were placed.  Procedure was complicated by the development of postop ileus.  Most recent TTE was performed on 01/13/2022 revealed normal left ventricular systolic function with an EF of 55 to 60%.  There were no regional wall motion abnormalities.  Diastolic Doppler parameters indeterminant.  Right ventricular size and function normal.  There was severe biatrial enlargement.  Mild mitral valve regurgitation noted.  Mild aortic valve sclerosis present.  All transvalvular gradients were noted to be normal providing no evidence suggestive of valvular stenosis.  Aorta normal in size with no evidence of aneurysmal dilatation.  Long-term cardiac event monitor study performed on 03/17/2022 revealing a predominant underlying atrial fibrillation/flutter with occasional PVCs and a bigeminy pattern with heart rates ranging from 52-168 bpm (average 89 bpm). There were no sustained pauses.  3 runs of PSVT noted with the fastest interval lasting 10 beats at a maximum rate of 190 bpm and the longest lasting 16 beats at an average rate of 121 bpm.  There was 100% atrial fibrillation/flutter burden.  Patient with an atrial fibrillation diagnosis; CHA2DS2-VASc Score = 7 (age x 2, CHF, HTN, TIA x 2, vascular disease history).patient underwent DCCV procedure on 04/25/2018, at which time he received a single 150 J synchronized cardioversion converting his atrial fibrillation to sinus bradycardia.  Cardiac rate and rhythm currently being maintained on metoprolol  tartrate.  Patient is chronically anticoagulated using a DOAC medication (apixaban).  Patient is reportedly compliant with therapy with no evidence or reports of GI bleeding. Blood pressure reasonably controlled at 140/86 mmHg on currently prescribed diuretic (furosemide) and beta-blocker (metoprolol tartrate) therapies.  Patient is on PCSK9i (evolocumab) therapy for his HLD diagnosis and ASCVD prevention.  He is intolerant of statins.  Patient has a supply of short acting nitrates (NTG) to use on a as needed basis for recurrent anginal symptoms; denied recent use.  Patient is not diabetic.  He does not have an OSAH diagnosis.  Functional capacity is somewhat limited by patient's age and multiple medical comorbidities, including worsening shortness of breath. Patient also is limited by lower back pain and associated neurogenic claudication.  With that said, patient is able to complete all of his ADL/IADLs without cardiovascular limitation.  Per the DASI, patient questionably able to achieve 4 METS of physical activity without experiencing, at least to some degree, significant angina/anginal equivalent symptoms.  No changes were made to his medication regimen.  In light of patient's upcoming surgery, given his worsening shortness of breath, the decision was made to further evaluate patient with noninvasive cardiac imaging.  Patient to follow-up with outpatient cardiology in 1 year or sooner if needed.  Since patient was last seen by his cardiologist.  He has undergone the recommended noninvasive cardiovascular testing.  Myocardial perfusion imaging study was performed on 04/29/2023 revealing a normal left ventricular systolic function with an EF of 64%.  There was no evidence of stress-induced myocardial ischemia or arrhythmia; no sonographic evidence of scar.  Study determined be normal and low risk.  Ryan [name] is scheduled for C4-5 LAMINECTOMY; C4-7 POSTERIOR SPINAL INSTRUMENTATION on 01/10/2024 with Dr.  Venetia Night, MD.  Given patient's past medical history significant for cardiovascular diagnoses, presurgical cardiac clearance was sought by the  PAT team. Per cardiology, "Mr. Doro's perioperative risk of a major cardiac event is 11% according to the Revised Cardiac Risk Index (RCRI).  Therefore, he is at high risk for perioperative complications.  His functional capacity is good at 5.05 METs according to the [place] Activity Status Index (DASI). According to ACC/AHA guidelines, no further cardiovascular testing needed.  The patient may proceed to surgery at HIGH/ACCEPTABLE risk".  Again, this patient is on daily oral anticoagulation therapy using a DOAC medication.  He has been instructed on recommendations for holding his apixaban for 3 days prior to his procedure with plans to restart as soon as postoperative bleeding risk felt to be minimized by his attending surgeon. Plans are for a 10-14 day postoperative hold. Cardiology notes that this places patient at increased risk for stroke, while risk is low, it is just not zero. The patient has been instructed that his last dose of his apixaban should be on 01/06/2024.  Given patient's extensive cardiovascular history, patient will continue his daily low-dose ASA throughout his perioperative course.  Patient denies previous perioperative complications with anesthesia in the past.  Patient does have a family history of (+) PONV and  (+) postoperative delirium manifested as agitation in first-degree relatives (sisters) in review of the available records, it is noted that patient underwent a general anesthetic course at here at [facility] ([facility]) in 05/2023 without documented complications.      01/01/2024   10:56 AM 01/01/2024    9:34 AM 12/28/2023   10:10 AM  Vitals with BMI  Height   5\' 4"   Weight   182 lbs 8 oz  BMI   31.31  Systolic 144 169 161  Diastolic 72 87 88  Pulse 67 76 74    Providers/Specialists:    NOTE: Primary physician provider listed below. Patient may have been seen by APP or partner within same practice.   PROVIDER ROLE / SPECIALTY LAST Donalynn Furlong, MD Neurosurgery (Surgeon) 10/26/2023  [name], MD Primary Care Provider 10/14/2023  Lewayne Bunting, MD Cardiology 04/19/2023; update APP call 12/13/2023   Allergies:  Albumin (human), Guaifenesin, Polymyxin b-trimethoprim, Pseudoephedrine, Codeine, Guaiacol, Polymyxin b, Statins, Atorvastatin, Brimonidine tartrate, Gabapentin, Meloxicam, Peppermint flavoring agent (non-screening), Pseudoephedrine-guaifenesin, Pseudoephedrine-guaifenesin, Rosuvastatin calcium, Rosuvastatin calcium, Simvastatin, Tapentadol, Ciprofloxacin, Moxifloxacin, and Rofecoxib  Current Home Medications:   No current facility-administered medications for this encounter.    acetaminophen (TYLENOL) 500 MG tablet   aspirin EC 81 MG EC tablet   budesonide (ENTOCORT EC) 3 MG 24 hr capsule   cholecalciferol (VITAMIN D3) 25 MCG (1000 UNIT) tablet   diazepam (VALIUM) 5 MG tablet   ELIQUIS 5 MG TABS tablet   Evolocumab (REPATHA SURECLICK) 140 MG/ML SOAJ   furosemide (LASIX) 20 MG tablet   latanoprost (XALATAN) 0.005 % ophthalmic solution   levalbuterol (XOPENEX HFA) 45 MCG/ACT inhaler   magnesium oxide (MAG-OX) 400 (240 Mg) MG tablet   metoprolol tartrate (LOPRESSOR) 100 MG tablet   Multiple Vitamins-Minerals (PRESERVISION AREDS 2) CAPS   nitroGLYCERIN (NITROSTAT) 0.4 MG SL tablet   omeprazole (PRILOSEC OTC) 20 MG tablet   Ranibizumab (LUCENTIS IO)   tamsulosin (FLOMAX) 0.4 MG CAPS capsule   diphenoxylate-atropine (LOMOTIL) 2.5-0.025 MG tablet   lidocaine (LIDODERM) 5 %   History:   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;  Gleason 5+5; PSA 1.8 --> Tx'd with XRT (12/10/21 - 02/18/22)  + LT-ADT; XRT course truncated due to severe radiation colitis   Allergy    Anemia    Anxiety    a.) on BZO PRN (diazepam)   Aortic atherosclerosis (HCC)    Bilateral carotid artery disease (HCC) 03/24/2021   a.) carotid doppler 03/24/2021: 1-39% BICA   BPH with urinary obstruction    CAD (coronary artery disease) 03/26/2011   a.) cCTA 03/26/2011: Ca2+ 505 (pRCA + LM); b.) LHC 04/01/2011: 40-50% oLM, 60-70% m-dLAD, 30% mD2, 60% pRCA - med mgmt; c.) LHC 10/17/2022L 90% oLM, 60% o-pLCx, 95% pRCA, 70% dRCA, 70% RPDA --> CVTS consult; d.) s/p 4v CABG 07/22/2021   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   Chronic diastolic CHF (congestive heart failure) (HCC) 01/14/2017   a.) TTE 01/14/2017: TTE 01/14/2017: EF 55-60%, sev LA dil, triv MR, G1DD; b.) TTE 03/23/2018: EF 55-60%, mod LVH, sev LA dil, mod-sev RA dil; c.) TTE 04/17/2020: EF 60-65%, mod asym ant seg LVH, mild LA dil, mild MR, G1DD; d.) TTE 03/25/2021: EF 50-55%, AoV sclerosis, triv MR   CKD (chronic kidney disease), stage III (HCC)    COPD (chronic obstructive pulmonary disease) (HCC)    DDD (degenerative disc disease), cervical    a.) s/p ACDF C5-C7 06/08/2002   DOE (dyspnea on exertion)    Eczema    Emphysema of lung (HCC)    Family history of adverse reaction to anesthesia    a.) PONV and postoperative delirium/agitation in 1st degree relatives (sisters)   GERD (gastroesophageal reflux disease)    Glaucoma, left eye    Heart murmur    Hemorrhoids    History of adenomatous polyp of colon    History of bilateral cataract extraction 2021   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 Barron Alvine--- dx by biopsy 01-28-2021   Macular degeneration of both eyes    followed by dr c. Gwendalyn Ege--- bilateral eye injection every 6 wks   NSVT (nonsustained ventricular tachycardia) (HCC) 03/17/2022   a.) Zio patch 03/17/2022: 3 runs with fastest lasting 10 beats at rate of 190 bpm and longest lasting 16 beats at rate of 121 bpm   On apixaban therapy    Osteoporosis 05/31/2016   PAF (paroxysmal atrial fibrillation) (HCC) 03/2012   a.) CHA2DS2VASc = 7 (age x2, CHF, HTN, TIA x2, vascular disease history);  b.) s/p DCCV 04/25/2018 (150J x 1 --> SB); c.) rate/rhythm maintained on oral metoprolol tartrate; chronically anticoagulated with apixaban   Postoperative ileus (HCC) 07/2021   a.) following CABG procedure   RBBB (right bundle branch block)    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   a.) LIMA-LAD, SVG-OM1, sequential SVG-acute marginal-PDA   SNHL (sensorineural hearing loss)    Statin  intolerance    Vision loss of left eye    macular degeneration   Vitamin D deficiency    Past Surgical History:  Procedure Laterality Date   ANTERIOR CERVICAL DECOMP/DISCECTOMY FUSION  06/08/2002   APPENDECTOMY  1953   CARDIAC CATHETERIZATION Left 04/01/2011   CARDIOVERSION N/A 04/25/2018   Procedure: CARDIOVERSION;  Surgeon: Chilton Si, MD;  Location[facility] ENDOSCOPY;  Service: Cardiovascular;  Laterality: N/A;   CATARACT EXTRACTION W/ INTRAOCULAR LENS IMPLANT Bilateral 2021   COLONOSCOPY WITH ESOPHAGOGASTRODUODENOSCOPY (EGD)  01/28/2021   by [email or web address]   CORONARY ARTERY BYPASS GRAFT N/A 07/22/2021   Procedure: 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, ON PUMP, USING LEFT INTERNAL MAMMARY ARTERY AND ENDOSCOPICALLY HARVESTED RIGHT GREATER SAPHENOUS VEIN;  Surgeon:  Loreli Slot, MD;  Location: [facility] OR;  Service: Open Heart Surgery;  Laterality: N/A;   ENDOVEIN HARVEST OF GREATER SAPHENOUS VEIN  07/22/2021   Procedure: ENDOVEIN HARVEST OF GREATER SAPHENOUS VEIN;  Surgeon: Loreli Slot, MD;  Location: [facility] OR;  Service: Open Heart Surgery;;   EXTRACORPOREAL SHOCK WAVE LITHOTRIPSY     x2  1990s   EYE SURGERY     FOOT SURGERY Right 1970   calcification removed from top of foot   GOLD SEED IMPLANT N/A 11/28/2021   Procedure: GOLD SEED IMPLANT;  Surgeon: Jerilee Field, MD;  Location: [facility];  Service: Urology;  Laterality: N/A;   KNEE ARTHROSCOPY Right 1991   LAPAROSCOPIC CHOLECYSTECTOMY  2000   LEFT HEART CATH AND CORONARY ANGIOGRAPHY N/A 07/21/2021   Procedure: LEFT HEART CATH AND CORONARY ANGIOGRAPHY;  Surgeon: Runell Gess, MD;  Location: [facility] INVASIVE CV LAB;  Service: Cardiovascular;  Laterality: N/A;   LUMBAR LAMINECTOMY/DECOMPRESSION MICRODISCECTOMY N/A 05/17/2023   Procedure: L4-5 DECOMPRESSION;  Surgeon: Venetia Night, MD;  Location: [facility] ORS;  Service: Neurosurgery;  Laterality: N/A;   SPACE OAR INSTILLATION N/A 11/28/2021   Procedure: SPACE OAR INSTILLATION;  Surgeon: Jerilee Field, MD;  Location: [facility];  Service: Urology;  Laterality: N/A;   TEE WITHOUT CARDIOVERSION N/A 07/22/2021   Procedure: TRANSESOPHAGEAL ECHOCARDIOGRAM (TEE);  Surgeon: Loreli Slot, MD;  Location: [facility] OR;  Service: Open Heart Surgery;  Laterality: N/A;   TOTAL KNEE ARTHROPLASTY Right 08/14/2009   @[facility]    TRANSURETHRAL RESECTION OF PROSTATE N/A 07/17/2021   Procedure: TRANSURETHRAL RESECTION OF THE PROSTATE (TURP);  Surgeon: Marcine Matar, MD;  Location: [facility];  Service: Urology;  Laterality: N/A;  1 HR   Family History  Problem Relation Age of Onset   Heart disease Mother    Diabetes Mother    Cirrhosis Mother    Emphysema Mother        never smoked but 2nd hand  through her spouse   Hypertension Mother    Macular degeneration Mother    Heart disease Father    Cancer Father        prostate   Hyperlipidemia Father    Hypertension Father    Varicose Veins Father    Heart attack Father    Peripheral vascular disease Father    Heart disease Sister    Arthritis Sister    Hyperlipidemia Sister    Obesity Sister    Macular degeneration Sister    Heart disease Brother        5 stents   Hyperlipidemia Brother    Macular degeneration Maternal Grandfather    Cirrhosis Sister    Obesity Sister    Arthritis Sister  Heart disease Sister    Obesity Sister    Liver disease Other    Prostate cancer Other    Coronary artery disease Other    Colon cancer Neg Hx    Esophageal cancer Neg Hx    Rectal cancer Neg Hx    Stomach cancer Neg Hx    Social History   Tobacco Use   Smoking status: Former    Current packs/day: 0.00    Average packs/day: 0.5 packs/day for 50.0 years (25.0 ttl pk-yrs)    Types: Cigarettes    Start date: 05/02/1959    Quit date: 05/01/2009    Years since quitting: 14.6   Smokeless tobacco: Former    Types: Chew    Quit date: 1995   Tobacco comments:    Former smoker 01/27/22  Vaping Use   Vaping status: Never Used  Substance Use Topics   Alcohol use: Yes    Alcohol/week: 2.0 - 3.0 standard drinks of alcohol    Types: 1 - 2 Glasses of wine, 1 Cans of beer per week    Comment: 1-2 glasses wine/beer daily   Drug use: Never    Pertinent Clinical Results:  LABS:  Lab Results  Component Value Date   WBC 9.7 12/28/2023   HGB 12.1 (L) 12/28/2023   HCT 35.5 (L) 12/28/2023   MCV 91.7 12/28/2023   PLT 192 12/28/2023   Lab Results  Component Value Date   NA 139 12/28/2023   K 4.6 12/28/2023   CO2 26 12/28/2023   GLUCOSE 98 12/28/2023   BUN 28 (H) 12/28/2023   CREATININE 0.89 12/28/2023   CALCIUM 9.4 12/28/2023   GFR 79.61 10/25/2023   EGFR 57 (L) 08/12/2021   GFRNONAA >60 12/28/2023    ECG: Date:  12/28/2023 Time ECG obtained: 0950 AM Rate: 69 bpm Rhythm: atrial fibrillation; RBBB Axis (leads I and aVF): Left axis deviation Intervals: QRS 116 ms. QTc 450 ms. ST segment and T wave changes: nonspecific ST/T wave abnormalities  comparison: Similar to previous tracing obtained on 04/19/2023   IMAGING / PROCEDURES: CT CERVICAL SPINE WO CONTRAST performed on 01/01/2024 No evidence of acute cervical spine fracture, traumatic subluxation or static signs of instability. Status post C5-7 ACDF with fracture of the left C5 screw. No solid interbody fusion identified at C5-6. Grossly stable chronic spondylosis at C4-5 with chronic spinal stenosis and biforaminal narrowing Partially calcified left apical pulmonary nodule, new compared with previous chest CT although likely a granuloma based on its calcified nature.  MR THORACIC SPINE WO CONTRAST performed on 10/11/2023 Status post C5-C7 ACDF with solid osseous fusion at C6-C7. No evidence of hardware complications. Severe spinal canal stenosis at C4-C5 with associated cord signal abnormality, which is favored to represent chronic myelomalacia. Severe bilateral neural foraminal narrowing at C4-C5. 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 This is unchanged from prior. 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 Differential considerations include an arachnoid web or dorsal arachnoid cyst.  MYOCARDIAL PERFUSION IMAGING STUDY (LEXISCAN) performed on 04/29/2023 Normal left ventricular systolic function with a normal LVEF of 64% Normal myocardial thickening and wall motion Left ventricular cavity size normal SPECT images demonstrate homogenous tracer distribution throughout the myocardium No evidence of stress-induced myocardial ischemia or  arrhythmia Normal low risk study  MR LUMBAR SPINE WO CONTRAST  performed on 02/28/2023 L4-5: Moderate disc bulge. Mild facet and ligamentous hypertrophy. Mild multifactorial stenosis with potential for neural compression in either or both lateral recesses. This has worsened slightly since 2020. L3-4: Minimal disc bulge. Mild facet and ligamentous hypertrophy. No compressive stenosis. L5-S1: Mild disc bulge. No compressive stenosis. Post radiation fatty change of the marrow. No sign of regional osseous metastatic disease.  LONG TERM CARDIAC EVENT MONITOR STUDY performed on  03/17/2022 Patch Wear Time:  2 days and 23 hours (2023-06-04T08:15:07-399 to 2023-06-07T07:48:22-0400) 3 Ventricular Tachycardia runs occurred, the run with the fastest interval lasting 10 beats with a max rate of 190 bpm, the longest lasting 16 beats with an avg rate of 121 bpm.  Atrial Fibrillation/Flutter occurred continuously (100% burden), ranging from 52-168 bpm (avg of 89 bpm).  Bundle Branch Block/IVCD was present.  Isolated VEs were occasional (1.4%, 5389), VE Couplets were rare (<1.0%, 116), and VE Triplets were rare (<1.0%, 1). Ventricular Bigeminy and Trigeminy were present.  CT ANGIO CHEST PE W/CM &/OR WO CM performed on 01/17/2022 No evidence of pulmonary embolism. Examination of the segmental and subsegmental arteries is limited due to respiratory motion artifact. Hazy ground-glass opacities in the lungs bilaterally, possible edema or infiltrate. Small to moderate bilateral pleural effusions. Emphysema. Aortic atherosclerosis  Coronary artery calcifications.   TRANSTHORACIC ECHOCARDIOGRAM performed on 01/13/2022 Left ventricular ejection fraction, by estimation, is 55 to 60%. The left ventricle has normal function. The left ventricle has no regional wall motion abnormalities. Left ventricular diastolic function could not be evaluated.  Right ventricular systolic function is normal. The right ventricular  size is normal.  Left atrial size was severely dilated.  Right atrial size was severely dilated.  The mitral valve is normal in structure. Mild mitral valve regurgitation. No evidence of mitral stenosis.  The aortic valve is normal in structure. Aortic valve regurgitation is not visualized. Aortic valve sclerosis is present, with no evidence of aortic valve stenosis.  The inferior vena cava is normal in size with greater than 50% respiratory variability, suggesting right atrial pressure of 3 mmHg.   CORONARY ARTERY BYPASS GRAFTING performed on  07/22/2021 4 vessel CABG LIMA-LAD SVG-OM1 Sequential SVG-acute marginal-PDA  LEFT HEART CATHETERIZATION AND CORONARY ANGIOGRAPHY performed on 07/21/2021 Normal left ventricular systolic function LVEDP = 18 mmHg Multivessel CAD 90% ostial LM 60% ostial to proximal LCx 95% proximal RCA 70% distal RCA 70% RPDA Recommendations Refer to CVTS for consultation regarding coronary revascularization   Impression and Plan:  Ryan [name] has been referred for pre-anesthesia review and clearance prior to him undergoing the planned anesthetic and procedural courses. Available labs, pertinent testing, and imaging results were personally reviewed by me in preparation for upcoming operative/procedural course. [facility] medical record has been updated following extensive record review and patient interview with PAT staff.   This patient has been appropriately cleared by cardiology with an overall HIGH/ACCEPTABLE risk of experiencing significant perioperative cardiovascular complications. Based on clinical review performed today (01/04/24), barring any significant acute changes in the patient's overall condition, it is anticipated that he will be able to proceed with the planned surgical intervention. Any acute changes in clinical condition may necessitate his procedure being postponed and/or cancelled. Patient will meet with anesthesia team (MD and/or CRNA) on  the day of his procedure for preoperative evaluation/assessment. Questions regarding anesthetic course will be fielded at that time.   Pre-surgical instructions were reviewed with the patient during his PAT appointment, and questions were fielded to satisfaction by PAT clinical  staff. He has been instructed on which medications that he will need to hold prior to surgery, as well as the ones that have been deemed safe/appropriate to take on the day of his procedure. As part of the general education provided by PAT, patient made aware both verbally and in writing, that he would need to abstain from the use of any illegal substances during his perioperative course.  He was advised that failure to follow the provided instructions could necessitate case cancellation or result in serious perioperative complications up to and including death. Patient encouraged to contact PAT and/or his surgeon's office to discuss any questions or concerns that may arise prior to surgery; verbalized understanding.   Quentin Mulling, MSN, APRN, FNP-C, CEN [facility]  Peri-operative Services Nurse Practitioner Phone: [phone] Fax: [phone] 01/04/24 10:09 AM  NOTE: This note has been prepared using Dragon dictation software. Despite my best ability to proofread, there is always the potential that unintentional transcriptional errors may still occur from this process.

## 2024-01-04 NOTE — Transitions of Care (Post Inpatient/ED Visit) (Signed)
 01/04/2024  Name: Ryan [name] MRN: [id number] DOB: 1946/01/01  Today's TOC FU Call Status: Today's TOC FU Call Status:: Successful TOC FU Call Completed TOC FU Call Complete Date: 01/04/24 Patient's Name and Date of Birth confirmed.  Transition Care Management Follow-up Telephone Call Date of Discharge: 01/01/24 Discharge Facility: [facility] ([facility]) Type of Discharge: Emergency Department Reason for ED Visit: Other: (Fall) How have you been since you were released from the hospital?: Better Any questions or concerns?: No  Items Reviewed: Did you receive and understand the discharge instructions provided?: Yes Medications obtained,verified, and reconciled?: Yes (Medications Reviewed) Any new allergies since your discharge?: No Dietary orders reviewed?: NA Do you have support at home?: Yes People in Home: spouse  Medications Reviewed Today: Medications Reviewed Today     Reviewed by Anthoney Harada, LPN (Licensed Practical Nurse) on 01/04/24 at 1432  Med List Status: <None>   Medication Order Taking? Sig Documenting Provider Last Dose Status Informant  acetaminophen (TYLENOL) 500 MG tablet [id number] Yes Take 1,000 mg by mouth at bedtime. [provider] Taking Active Self  aspirin EC 81 MG EC tablet [id number] Yes Take 1 tablet (81 mg total) by mouth daily. Swallow whole. Leary Roca, PA-C Taking Active Self           Med Note Graylin Shiver   Tue Jan 27, 2022  1:36 PM)    budesonide (ENTOCORT EC) 3 MG 24 hr capsule [id number] Yes TAKE 2 CAPSULES(6 MG) BY MOUTH DAILY Cirigliano, Vito V, DO Taking Active Self  cholecalciferol (VITAMIN D3) 25 MCG (1000 UNIT) tablet [id number] Yes Take 10,000 Units by mouth 2 (two) times a week. Monday and Friday [provider] Taking Active Self  diazepam (VALIUM) 5 MG tablet [id number] Yes TAKE 1/2 TO 2 TABLETS(2.5 TO 10 MG) BY MOUTH EVERY 12 HOURS AS NEEDED FOR ANXIETY [name], MD Taking Active  Self  diphenoxylate-atropine (LOMOTIL) 2.5-0.025 MG tablet [id number] Yes Take 1 tablet by mouth 4 (four) times daily as needed for diarrhea or loose stools. [provider] Taking Active   ELIQUIS 5 MG TABS tablet [id number] Yes TAKE 1 TABLET BY MOUTH TWICE  DAILY Marinus Maw, MD Taking Active Self  Evolocumab [facility] SURECLICK) 140 MG/ML Ivory Broad [id number] Yes Inject 140 mg into the skin every 14 (fourteen) days. Marinus Maw, MD Taking Active Self  furosemide (LASIX) 20 MG tablet [id number] Yes TAKE 1 TABLET BY MOUTH DAILY AS  NEEDED [name], MD Taking Active Self  latanoprost (XALATAN) 0.005 % ophthalmic solution [id number] Yes Place 1 drop into the left eye at bedtime.  [provider] Taking Active Self  levalbuterol [facility] HFA) 45 MCG/ACT inhaler [id number] Yes Inhale 2 puffs into the lungs every 4 (four) hours as needed for wheezing or shortness of breath. [name], MD Taking Active Self  lidocaine (LIDODERM) 5 % [id number] Yes Place 1 patch onto the skin daily. Remove & Discard patch within 12 hours or as directed by MD Netta Corrigan, PA-C Taking Active   magnesium oxide (MAG-OX) 400 (240 Mg) MG tablet [id number] Yes TAKE 1 TABLET(400 MG) BY MOUTH THREE TIMES DAILY [name], MD Taking Active Self  metoprolol tartrate (LOPRESSOR) 100 MG tablet [id number] Yes TAKE 1 TABLET BY MOUTH TWICE  DAILY Marinus Maw, MD Taking Active Self  Multiple Vitamins-Minerals (PRESERVISION AREDS 2) CAPS [id number] Yes Take 1 capsule by mouth daily. [provider] Taking Active Self  nitroGLYCERIN (NITROSTAT) 0.4 MG  SL tablet [id number] Yes Place 1 tablet (0.4 mg total) under the tongue every 5 (five) minutes as needed for chest pain. Drema Dallas, MD Taking Active Self  omeprazole (PRILOSEC OTC) 20 MG tablet [id number] Yes Take 20 mg by mouth every morning. [provider] Taking Active Self  Ranibizumab (LUCENTIS IO) [id number] Yes Inject 1 Dose into the  eye every 6 (six) weeks. Bilateral eye by Dr Marvis Repress [provider] Taking Active Self           Med Note Roxan Hockey, [place] R   Tue Aug 12, 2021 10:15 AM)    tamsulosin (FLOMAX) 0.4 MG CAPS capsule [id number] Yes Take 0.4 mg by mouth 2 (two) times daily. [provider] Taking Active Self            Home Care and Equipment/Supplies: Were Home Health Services Ordered?: NA Any new equipment or medical supplies ordered?: NA  Functional Questionnaire: Do you need assistance with bathing/showering or dressing?: No Do you need assistance with meal preparation?: No Do you need assistance with eating?: No Do you have difficulty maintaining continence: No Do you need assistance with getting out of bed/getting out of a chair/moving?: No Do you have difficulty managing or taking your medications?: No  Follow up appointments reviewed: PCP Follow-up appointment confirmed?: NA Specialist Hospital Follow-up appointment confirmed?: NA Do you need transportation to your follow-up appointment?: No Do you understand care options if your condition(s) worsen?: Yes-patient verbalized understanding   SIGNATURE Kandis Fantasia, LPN [facility] Advisor [facility] You Are. We Are. [facility] Direct Dial [phone]

## 2024-01-10 ENCOUNTER — Encounter: Payer: Self-pay | Admitting: Neurosurgery

## 2024-01-10 ENCOUNTER — Inpatient Hospital Stay: Payer: Self-pay | Admitting: Urgent Care

## 2024-01-10 ENCOUNTER — Other Ambulatory Visit: Payer: Self-pay

## 2024-01-10 ENCOUNTER — Inpatient Hospital Stay
Admission: RE | Admit: 2024-01-10 | Discharge: 2024-01-13 | DRG: 430 | Disposition: A | Discharge status: Home Health | Payer: Medicare Other | Attending: Neurosurgery | Admitting: Neurosurgery

## 2024-01-10 ENCOUNTER — Encounter
Admission: RE | Disposition: A | Discharge status: Home Health | Payer: Self-pay | Source: Home / Self Care | Attending: Neurosurgery

## 2024-01-10 ENCOUNTER — Inpatient Hospital Stay

## 2024-01-10 DIAGNOSIS — Z981 Arthrodesis status: Principal | ICD-10-CM

## 2024-01-10 DIAGNOSIS — H5462 Unqualified visual loss, left eye, normal vision right eye: Secondary | ICD-10-CM | POA: Diagnosis not present

## 2024-01-10 DIAGNOSIS — M4802 Spinal stenosis, cervical region: Secondary | ICD-10-CM | POA: Diagnosis not present

## 2024-01-10 DIAGNOSIS — J439 Emphysema, unspecified: Secondary | ICD-10-CM | POA: Diagnosis not present

## 2024-01-10 DIAGNOSIS — I480 Paroxysmal atrial fibrillation: Secondary | ICD-10-CM | POA: Diagnosis not present

## 2024-01-10 DIAGNOSIS — H3530 Unspecified macular degeneration: Secondary | ICD-10-CM | POA: Diagnosis not present

## 2024-01-10 DIAGNOSIS — N183 Chronic kidney disease, stage 3 unspecified: Secondary | ICD-10-CM | POA: Diagnosis present

## 2024-01-10 DIAGNOSIS — Z951 Presence of aortocoronary bypass graft: Secondary | ICD-10-CM | POA: Diagnosis not present

## 2024-01-10 DIAGNOSIS — M5412 Radiculopathy, cervical region: Secondary | ICD-10-CM | POA: Diagnosis not present

## 2024-01-10 DIAGNOSIS — Z8673 Personal history of transient ischemic attack (TIA), and cerebral infarction without residual deficits: Secondary | ICD-10-CM

## 2024-01-10 DIAGNOSIS — F419 Anxiety disorder, unspecified: Secondary | ICD-10-CM | POA: Diagnosis present

## 2024-01-10 DIAGNOSIS — Z886 Allergy status to analgesic agent status: Secondary | ICD-10-CM

## 2024-01-10 DIAGNOSIS — Z8249 Family history of ischemic heart disease and other diseases of the circulatory system: Secondary | ICD-10-CM

## 2024-01-10 DIAGNOSIS — K219 Gastro-esophageal reflux disease without esophagitis: Secondary | ICD-10-CM | POA: Diagnosis present

## 2024-01-10 DIAGNOSIS — Z87891 Personal history of nicotine dependence: Secondary | ICD-10-CM | POA: Diagnosis not present

## 2024-01-10 DIAGNOSIS — I5032 Chronic diastolic (congestive) heart failure: Secondary | ICD-10-CM | POA: Diagnosis not present

## 2024-01-10 DIAGNOSIS — I130 Hypertensive heart and chronic kidney disease with heart failure and stage 1 through stage 4 chronic kidney disease, or unspecified chronic kidney disease: Secondary | ICD-10-CM | POA: Diagnosis present

## 2024-01-10 DIAGNOSIS — Z01818 Encounter for other preprocedural examination: Secondary | ICD-10-CM

## 2024-01-10 DIAGNOSIS — I2510 Atherosclerotic heart disease of native coronary artery without angina pectoris: Secondary | ICD-10-CM | POA: Diagnosis not present

## 2024-01-10 DIAGNOSIS — Z8042 Family history of malignant neoplasm of prostate: Secondary | ICD-10-CM

## 2024-01-10 DIAGNOSIS — Z8619 Personal history of other infectious and parasitic diseases: Secondary | ICD-10-CM

## 2024-01-10 DIAGNOSIS — R011 Cardiac murmur, unspecified: Secondary | ICD-10-CM | POA: Diagnosis present

## 2024-01-10 DIAGNOSIS — I700 Atherosclerosis of aorta: Secondary | ICD-10-CM | POA: Diagnosis not present

## 2024-01-10 DIAGNOSIS — Z9842 Cataract extraction status, left eye: Secondary | ICD-10-CM

## 2024-01-10 DIAGNOSIS — Z8546 Personal history of malignant neoplasm of prostate: Secondary | ICD-10-CM

## 2024-01-10 DIAGNOSIS — Z96651 Presence of right artificial knee joint: Secondary | ICD-10-CM | POA: Diagnosis present

## 2024-01-10 DIAGNOSIS — H409 Unspecified glaucoma: Secondary | ICD-10-CM | POA: Diagnosis present

## 2024-01-10 DIAGNOSIS — S51812A Laceration without foreign body of left forearm, initial encounter: Secondary | ICD-10-CM | POA: Diagnosis not present

## 2024-01-10 DIAGNOSIS — Z881 Allergy status to other antibiotic agents status: Secondary | ICD-10-CM

## 2024-01-10 DIAGNOSIS — Z7901 Long term (current) use of anticoagulants: Secondary | ICD-10-CM

## 2024-01-10 DIAGNOSIS — X58XXXA Exposure to other specified factors, initial encounter: Secondary | ICD-10-CM | POA: Diagnosis not present

## 2024-01-10 DIAGNOSIS — R270 Ataxia, unspecified: Secondary | ICD-10-CM | POA: Diagnosis not present

## 2024-01-10 DIAGNOSIS — M50021 Cervical disc disorder at C4-C5 level with myelopathy: Secondary | ICD-10-CM | POA: Diagnosis not present

## 2024-01-10 DIAGNOSIS — Z825 Family history of asthma and other chronic lower respiratory diseases: Secondary | ICD-10-CM

## 2024-01-10 DIAGNOSIS — Z961 Presence of intraocular lens: Secondary | ICD-10-CM | POA: Diagnosis present

## 2024-01-10 DIAGNOSIS — Z91018 Allergy to other foods: Secondary | ICD-10-CM

## 2024-01-10 DIAGNOSIS — M810 Age-related osteoporosis without current pathological fracture: Secondary | ICD-10-CM | POA: Diagnosis present

## 2024-01-10 DIAGNOSIS — Z9079 Acquired absence of other genital organ(s): Secondary | ICD-10-CM

## 2024-01-10 DIAGNOSIS — Z885 Allergy status to narcotic agent status: Secondary | ICD-10-CM

## 2024-01-10 DIAGNOSIS — Z860101 Personal history of adenomatous and serrated colon polyps: Secondary | ICD-10-CM | POA: Diagnosis not present

## 2024-01-10 DIAGNOSIS — M50023 Cervical disc disorder at C6-C7 level with myelopathy: Secondary | ICD-10-CM | POA: Diagnosis not present

## 2024-01-10 DIAGNOSIS — G959 Disease of spinal cord, unspecified: Secondary | ICD-10-CM

## 2024-01-10 DIAGNOSIS — G9589 Other specified diseases of spinal cord: Secondary | ICD-10-CM

## 2024-01-10 DIAGNOSIS — Z9841 Cataract extraction status, right eye: Secondary | ICD-10-CM

## 2024-01-10 DIAGNOSIS — Z87442 Personal history of urinary calculi: Secondary | ICD-10-CM

## 2024-01-10 DIAGNOSIS — H905 Unspecified sensorineural hearing loss: Secondary | ICD-10-CM | POA: Diagnosis present

## 2024-01-10 DIAGNOSIS — I4510 Unspecified right bundle-branch block: Secondary | ICD-10-CM | POA: Diagnosis present

## 2024-01-10 DIAGNOSIS — Z888 Allergy status to other drugs, medicaments and biological substances status: Secondary | ICD-10-CM

## 2024-01-10 DIAGNOSIS — Z9049 Acquired absence of other specified parts of digestive tract: Secondary | ICD-10-CM

## 2024-01-10 HISTORY — PX: POSTERIOR CERVICAL FUSION/FORAMINOTOMY: SHX5038

## 2024-01-10 HISTORY — PX: POSTERIOR CERVICAL LAMINECTOMY: SHX2248

## 2024-01-10 LAB — GLUCOSE, CAPILLARY: Glucose-Capillary: 94 mg/dL (ref 70–99)

## 2024-01-10 SURGERY — POSTERIOR CERVICAL LAMINECTOMY
Anesthesia: General | Site: Spine Cervical

## 2024-01-10 MED ORDER — SUCCINYLCHOLINE CHLORIDE 200 MG/10ML IV SOSY
PREFILLED_SYRINGE | INTRAVENOUS | Status: DC | PRN
  Administered 2024-01-10: 100 mg via INTRAVENOUS

## 2024-01-10 MED ORDER — DOCUSATE SODIUM 100 MG PO CAPS
100.0000 mg | ORAL_CAPSULE | Freq: Two times a day (BID) | ORAL | Status: DC
  Administered 2024-01-11 – 2024-01-13 (×4): 100 mg via ORAL
  Filled 2024-01-10 (×5): qty 1

## 2024-01-10 MED ORDER — FENTANYL CITRATE (PF) 100 MCG/2ML IJ SOLN
INTRAMUSCULAR | Status: AC
  Filled 2024-01-10: qty 2

## 2024-01-10 MED ORDER — REMIFENTANIL HCL 1 MG IV SOLR
INTRAVENOUS | Status: DC | PRN
  Administered 2024-01-10: .1 ug/kg/min via INTRAVENOUS

## 2024-01-10 MED ORDER — VASOPRESSIN 20 UNIT/ML IV SOLN
INTRAVENOUS | Status: DC | PRN
Start: 2024-01-10 — End: 2024-01-10
  Administered 2024-01-10 (×2): 1 [IU] via INTRAVENOUS
  Administered 2024-01-10: 2 [IU] via INTRAVENOUS

## 2024-01-10 MED ORDER — BUPIVACAINE-EPINEPHRINE (PF) 0.5% -1:200000 IJ SOLN
INTRAMUSCULAR | Status: DC | PRN
  Administered 2024-01-10: 8 mL

## 2024-01-10 MED ORDER — OXYCODONE HCL 5 MG PO TABS
10.0000 mg | ORAL_TABLET | ORAL | Status: DC | PRN
  Administered 2024-01-11 – 2024-01-13 (×5): 10 mg via ORAL
  Filled 2024-01-10 (×7): qty 2

## 2024-01-10 MED ORDER — METHOCARBAMOL 500 MG PO TABS
500.0000 mg | ORAL_TABLET | Freq: Four times a day (QID) | ORAL | Status: DC | PRN
  Administered 2024-01-11 – 2024-01-12 (×2): 500 mg via ORAL
  Filled 2024-01-10 (×2): qty 1

## 2024-01-10 MED ORDER — PHENOL 1.4 % MT LIQD: 1.0000 | OROMUCOSAL | Status: DC | PRN

## 2024-01-10 MED ORDER — DIPHENOXYLATE-ATROPINE 2.5-0.025 MG PO TABS: 1.0000 | ORAL_TABLET | Freq: Four times a day (QID) | ORAL | Status: DC | PRN

## 2024-01-10 MED ORDER — SENNA 8.6 MG PO TABS
1.0000 | ORAL_TABLET | Freq: Two times a day (BID) | ORAL | Status: DC
  Administered 2024-01-11 – 2024-01-13 (×4): 8.6 mg via ORAL
  Filled 2024-01-10 (×5): qty 1

## 2024-01-10 MED ORDER — METHOCARBAMOL 1000 MG/10ML IJ SOLN
500.0000 mg | Freq: Four times a day (QID) | INTRAMUSCULAR | Status: DC | PRN
  Administered 2024-01-10: 500 mg via INTRAVENOUS

## 2024-01-10 MED ORDER — ACETAMINOPHEN 500 MG PO TABS
1000.0000 mg | ORAL_TABLET | Freq: Four times a day (QID) | ORAL | Status: DC
  Administered 2024-01-10: 1000 mg via ORAL
  Filled 2024-01-10 (×2): qty 2

## 2024-01-10 MED ORDER — SODIUM CHLORIDE 0.9 % IV SOLN
250.0000 mL | INTRAVENOUS | Status: AC
  Administered 2024-01-10: 250 mL via INTRAVENOUS

## 2024-01-10 MED ORDER — OXYCODONE HCL 5 MG/5ML PO SOLN: 5.0000 mg | Freq: Once | ORAL | Status: AC | PRN

## 2024-01-10 MED ORDER — OMEPRAZOLE MAGNESIUM 20 MG PO TBEC
20.0000 mg | DELAYED_RELEASE_TABLET | ORAL | Status: DC
Start: 2024-01-11 — End: 2024-01-10

## 2024-01-10 MED ORDER — OXYCODONE HCL 5 MG PO TABS
ORAL_TABLET | ORAL | Status: AC
  Filled 2024-01-10: qty 1

## 2024-01-10 MED ORDER — SORBITOL 70 % SOLN: 30.0000 mL | Freq: Every day | Status: DC | PRN

## 2024-01-10 MED ORDER — ENOXAPARIN SODIUM 40 MG/0.4ML IJ SOSY
40.0000 mg | PREFILLED_SYRINGE | INTRAMUSCULAR | Status: DC
  Administered 2024-01-11 – 2024-01-13 (×3): 40 mg via SUBCUTANEOUS
  Filled 2024-01-10 (×3): qty 0.4

## 2024-01-10 MED ORDER — ONDANSETRON HCL 4 MG/2ML IJ SOLN
INTRAMUSCULAR | Status: DC | PRN
  Administered 2024-01-10 (×2): 4 mg via INTRAVENOUS

## 2024-01-10 MED ORDER — POLYETHYLENE GLYCOL 3350 17 G PO PACK: 17.0000 g | PACK | Freq: Every day | ORAL | Status: DC | PRN

## 2024-01-10 MED ORDER — ACETAMINOPHEN 10 MG/ML IV SOLN
INTRAVENOUS | Status: AC
  Filled 2024-01-10: qty 100

## 2024-01-10 MED ORDER — IRRISEPT - 450ML BOTTLE WITH 0.05% CHG IN STERILE WATER, USP 99.95% OPTIME
TOPICAL | Status: DC | PRN
  Administered 2024-01-10: 450 mL

## 2024-01-10 MED ORDER — BUPIVACAINE HCL (PF) 0.5 % IJ SOLN
INTRAMUSCULAR | Status: AC
  Filled 2024-01-10: qty 30

## 2024-01-10 MED ORDER — LEVALBUTEROL TARTRATE 45 MCG/ACT IN AERO: 2.0000 | INHALATION_SPRAY | RESPIRATORY_TRACT | Status: DC | PRN

## 2024-01-10 MED ORDER — ACETAMINOPHEN 10 MG/ML IV SOLN: 1000.0000 mg | Freq: Once | INTRAVENOUS | Status: DC | PRN

## 2024-01-10 MED ORDER — MAGNESIUM OXIDE -MG SUPPLEMENT 400 (240 MG) MG PO TABS
400.0000 mg | ORAL_TABLET | Freq: Three times a day (TID) | ORAL | Status: DC
  Administered 2024-01-10 – 2024-01-13 (×7): 400 mg via ORAL
  Filled 2024-01-10 (×7): qty 1

## 2024-01-10 MED ORDER — MENTHOL 3 MG MT LOZG: 1.0000 | LOZENGE | OROMUCOSAL | Status: DC | PRN

## 2024-01-10 MED ORDER — PANTOPRAZOLE SODIUM 40 MG PO TBEC
40.0000 mg | DELAYED_RELEASE_TABLET | Freq: Every day | ORAL | Status: DC
  Administered 2024-01-11 – 2024-01-13 (×3): 40 mg via ORAL
  Filled 2024-01-10 (×3): qty 1

## 2024-01-10 MED ORDER — CEFAZOLIN SODIUM-DEXTROSE 2-4 GM/100ML-% IV SOLN
INTRAVENOUS | Status: AC
  Filled 2024-01-10: qty 100

## 2024-01-10 MED ORDER — DIAZEPAM 5 MG PO TABS: 5.0000 mg | ORAL_TABLET | Freq: Two times a day (BID) | ORAL | Status: DC | PRN

## 2024-01-10 MED ORDER — ACETAMINOPHEN 650 MG RE SUPP: 650.0000 mg | RECTAL | Status: DC | PRN

## 2024-01-10 MED ORDER — DEXAMETHASONE SODIUM PHOSPHATE 10 MG/ML IJ SOLN
INTRAMUSCULAR | Status: DC | PRN
  Administered 2024-01-10: 10 mg via INTRAVENOUS

## 2024-01-10 MED ORDER — SODIUM CHLORIDE (PF) 0.9 % IJ SOLN
INTRAMUSCULAR | Status: DC | PRN
  Administered 2024-01-10: 60 mL via INTRAMUSCULAR

## 2024-01-10 MED ORDER — SODIUM CHLORIDE 0.9 % IV SOLN: INTRAVENOUS | Status: DC

## 2024-01-10 MED ORDER — LIDOCAINE HCL (CARDIAC) PF 100 MG/5ML IV SOSY
PREFILLED_SYRINGE | INTRAVENOUS | Status: DC | PRN
  Administered 2024-01-10: 100 mg via INTRAVENOUS

## 2024-01-10 MED ORDER — TAMSULOSIN HCL 0.4 MG PO CAPS
0.4000 mg | ORAL_CAPSULE | Freq: Two times a day (BID) | ORAL | Status: DC
  Administered 2024-01-10 – 2024-01-13 (×6): 0.4 mg via ORAL
  Filled 2024-01-10 (×6): qty 1

## 2024-01-10 MED ORDER — PROPOFOL 1000 MG/100ML IV EMUL
INTRAVENOUS | Status: AC
  Filled 2024-01-10: qty 100

## 2024-01-10 MED ORDER — SURGIFLO WITH THROMBIN (HEMOSTATIC MATRIX KIT) OPTIME
TOPICAL | Status: DC | PRN
  Administered 2024-01-10: 1 via TOPICAL

## 2024-01-10 MED ORDER — CEFAZOLIN IN SODIUM CHLORIDE 2-0.9 GM/100ML-% IV SOLN
2.0000 g | Freq: Once | INTRAVENOUS | Status: AC
  Administered 2024-01-10: 2 g via INTRAVENOUS
  Filled 2024-01-10: qty 100

## 2024-01-10 MED ORDER — HYDROMORPHONE HCL 1 MG/ML IJ SOLN
0.5000 mg | INTRAMUSCULAR | Status: DC | PRN
  Administered 2024-01-10 (×3): 0.5 mg via INTRAVENOUS

## 2024-01-10 MED ORDER — METHOCARBAMOL 1000 MG/10ML IJ SOLN
INTRAMUSCULAR | Status: AC
  Filled 2024-01-10: qty 10

## 2024-01-10 MED ORDER — METOPROLOL TARTRATE 50 MG PO TABS
100.0000 mg | ORAL_TABLET | Freq: Two times a day (BID) | ORAL | Status: DC
  Administered 2024-01-10 – 2024-01-13 (×6): 100 mg via ORAL
  Filled 2024-01-10 (×6): qty 2

## 2024-01-10 MED ORDER — SODIUM CHLORIDE 0.9% FLUSH
3.0000 mL | Freq: Two times a day (BID) | INTRAVENOUS | Status: DC
  Administered 2024-01-10 – 2024-01-13 (×6): 3 mL via INTRAVENOUS

## 2024-01-10 MED ORDER — FENTANYL CITRATE (PF) 100 MCG/2ML IJ SOLN
INTRAMUSCULAR | Status: DC | PRN
  Administered 2024-01-10: 50 ug via INTRAVENOUS

## 2024-01-10 MED ORDER — PHENYLEPHRINE 80 MCG/ML (10ML) SYRINGE FOR IV PUSH (FOR BLOOD PRESSURE SUPPORT)
PREFILLED_SYRINGE | INTRAVENOUS | Status: DC | PRN
  Administered 2024-01-10 (×3): 80 ug via INTRAVENOUS

## 2024-01-10 MED ORDER — PROPOFOL 500 MG/50ML IV EMUL
INTRAVENOUS | Status: DC | PRN
  Administered 2024-01-10: 165 ug/kg/min via INTRAVENOUS

## 2024-01-10 MED ORDER — PHENYLEPHRINE HCL-NACL 20-0.9 MG/250ML-% IV SOLN
INTRAVENOUS | Status: DC | PRN
  Administered 2024-01-10: 25 ug/min via INTRAVENOUS

## 2024-01-10 MED ORDER — HYDROMORPHONE HCL 1 MG/ML IJ SOLN
INTRAMUSCULAR | Status: AC
  Filled 2024-01-10: qty 1

## 2024-01-10 MED ORDER — BUDESONIDE 3 MG PO CPEP
6.0000 mg | ORAL_CAPSULE | Freq: Every day | ORAL | Status: DC
Start: 2024-01-11 — End: 2024-01-13
  Administered 2024-01-11 – 2024-01-13 (×3): 6 mg via ORAL
  Filled 2024-01-10 (×3): qty 2

## 2024-01-10 MED ORDER — FENTANYL CITRATE (PF) 100 MCG/2ML IJ SOLN
25.0000 ug | INTRAMUSCULAR | Status: AC | PRN
  Administered 2024-01-10 (×6): 25 ug via INTRAVENOUS

## 2024-01-10 MED ORDER — SODIUM CHLORIDE (PF) 0.9 % IJ SOLN
INTRAMUSCULAR | Status: AC
  Filled 2024-01-10: qty 20

## 2024-01-10 MED ORDER — OXYCODONE HCL 5 MG PO TABS
5.0000 mg | ORAL_TABLET | ORAL | Status: DC | PRN
  Administered 2024-01-10 – 2024-01-12 (×5): 5 mg via ORAL
  Filled 2024-01-10 (×5): qty 1

## 2024-01-10 MED ORDER — GLYCOPYRROLATE 0.2 MG/ML IJ SOLN
INTRAMUSCULAR | Status: DC | PRN
  Administered 2024-01-10: .2 mg via INTRAVENOUS

## 2024-01-10 MED ORDER — PROPOFOL 10 MG/ML IV BOLUS
INTRAVENOUS | Status: DC | PRN
  Administered 2024-01-10: 100 mg via INTRAVENOUS
  Administered 2024-01-10: 50 mg via INTRAVENOUS

## 2024-01-10 MED ORDER — ACETAMINOPHEN 500 MG PO TABS
1000.0000 mg | ORAL_TABLET | Freq: Four times a day (QID) | ORAL | Status: DC
  Administered 2024-01-10 – 2024-01-13 (×9): 1000 mg via ORAL
  Filled 2024-01-10 (×11): qty 2

## 2024-01-10 MED ORDER — OXYCODONE HCL 5 MG PO TABS
5.0000 mg | ORAL_TABLET | Freq: Once | ORAL | Status: AC | PRN
  Administered 2024-01-10: 5 mg via ORAL

## 2024-01-10 MED ORDER — 0.9 % SODIUM CHLORIDE (POUR BTL) OPTIME
TOPICAL | Status: DC | PRN
  Administered 2024-01-10: 500 mL

## 2024-01-10 MED ORDER — HYDROMORPHONE HCL 1 MG/ML IJ SOLN
INTRAMUSCULAR | Status: AC
Start: 2024-01-10 — End: ?
  Filled 2024-01-10: qty 1

## 2024-01-10 MED ORDER — ONDANSETRON HCL 4 MG/2ML IJ SOLN: 4.0000 mg | Freq: Four times a day (QID) | INTRAMUSCULAR | Status: DC | PRN

## 2024-01-10 MED ORDER — ACETAMINOPHEN 10 MG/ML IV SOLN
INTRAVENOUS | Status: DC | PRN
Start: 2024-01-10 — End: 2024-01-10
  Administered 2024-01-10: 1000 mg via INTRAVENOUS

## 2024-01-10 MED ORDER — VITAMIN D 25 MCG (1000 UNIT) PO TABS
10000.0000 [IU] | ORAL_TABLET | ORAL | Status: DC
  Filled 2024-01-10: qty 10

## 2024-01-10 MED ORDER — ACETAMINOPHEN 325 MG PO TABS: 650.0000 mg | ORAL_TABLET | ORAL | Status: DC | PRN

## 2024-01-10 MED ORDER — ONDANSETRON HCL 4 MG PO TABS: 4.0000 mg | ORAL_TABLET | Freq: Four times a day (QID) | ORAL | Status: DC | PRN

## 2024-01-10 MED ORDER — MAGNESIUM CITRATE PO SOLN: 1.0000 | Freq: Once | ORAL | Status: DC | PRN

## 2024-01-10 MED ORDER — LACTATED RINGERS IV SOLN
INTRAVENOUS | Status: DC
Start: 2024-01-10 — End: 2024-01-10

## 2024-01-10 MED ORDER — BUPIVACAINE LIPOSOME 1.3 % IJ SUSP
INTRAMUSCULAR | Status: AC
  Filled 2024-01-10: qty 20

## 2024-01-10 MED ORDER — REMIFENTANIL HCL 1 MG IV SOLR
INTRAVENOUS | Status: AC
  Filled 2024-01-10: qty 1000

## 2024-01-10 MED ORDER — EPHEDRINE SULFATE-NACL 50-0.9 MG/10ML-% IV SOSY
PREFILLED_SYRINGE | INTRAVENOUS | Status: DC | PRN
Start: 2024-01-10 — End: 2024-01-10
  Administered 2024-01-10: 5 mg via INTRAVENOUS

## 2024-01-10 MED ORDER — SODIUM CHLORIDE 0.9% FLUSH: 3.0000 mL | INTRAVENOUS | Status: DC | PRN

## 2024-01-10 MED ORDER — HYDROMORPHONE HCL 1 MG/ML IJ SOLN
0.5000 mg | INTRAMUSCULAR | Status: AC | PRN
  Administered 2024-01-10: 0.5 mg via INTRAVENOUS
  Filled 2024-01-10: qty 0.5

## 2024-01-10 MED ORDER — ONDANSETRON HCL 4 MG/2ML IJ SOLN: 4.0000 mg | Freq: Once | INTRAMUSCULAR | Status: DC | PRN

## 2024-01-10 MED ORDER — LATANOPROST 0.005 % OP SOLN
1.0000 [drp] | Freq: Every day | OPHTHALMIC | Status: DC
  Administered 2024-01-10 – 2024-01-12 (×3): 1 [drp] via OPHTHALMIC
  Filled 2024-01-10 (×2): qty 2.5

## 2024-01-10 SURGICAL SUPPLY — 49 items
ALLOGRAFT BONE FIBER KORE 5 (Bone Implant) IMPLANT
BASIN KIT SINGLE STR (MISCELLANEOUS) ×1 IMPLANT
BIT DRILL NS LF DISP RELINE C (BIT) IMPLANT
BUR NEURO DRILL SOFT 3.0X3.8M (BURR) ×1 IMPLANT
CLAMP LG MULTI PIN (Clamp) IMPLANT
DERMABOND ADVANCED .7 DNX12 (GAUZE/BANDAGES/DRESSINGS) ×1 IMPLANT
DRAPE C ARM PK CFD 31 SPINE (DRAPES) ×1 IMPLANT
DRAPE LAPAROTOMY 100X77 ABD (DRAPES) ×1 IMPLANT
DRAPE MICROSCOPE SPINE 48X150 (DRAPES) IMPLANT
DRSG OPSITE POSTOP 4X6 (GAUZE/BANDAGES/DRESSINGS) IMPLANT
DRSG TEGADERM 4X4.75 (GAUZE/BANDAGES/DRESSINGS) IMPLANT
EVACUATOR 1/8 PVC DRAIN (DRAIN) ×1 IMPLANT
FEE INTRAOP CADWELL SUPPLY NCS (MISCELLANEOUS) IMPLANT
FEE INTRAOP MONITOR IMPULS NCS (MISCELLANEOUS) IMPLANT
GAUZE SPONGE 2X2 STRL 8-PLY (GAUZE/BANDAGES/DRESSINGS) IMPLANT
GLOVE BIOGEL PI IND STRL 6.5 (GLOVE) ×1 IMPLANT
GLOVE SURG SYN 6.5 ES PF (GLOVE) ×1 IMPLANT
GLOVE SURG SYN 6.5 PF PI (GLOVE) ×1 IMPLANT
GLOVE SURG SYN 8.5 E (GLOVE) ×3 IMPLANT
GLOVE SURG SYN 8.5 PF PI (GLOVE) ×3 IMPLANT
GOWN SRG LRG LVL 4 IMPRV REINF (GOWNS) ×1 IMPLANT
GOWN SRG XL LVL 3 NONREINFORCE (GOWNS) ×1 IMPLANT
HOLDER FOLEY CATH W/STRAP (MISCELLANEOUS) ×1 IMPLANT
INTRAOP CADWELL SUPPLY FEE NCS (MISCELLANEOUS) ×1 IMPLANT
INTRAOP MONITOR FEE IMPULS NCS (MISCELLANEOUS) ×1 IMPLANT
JET LAVAGE IRRISEPT WOUND (IRRIGATION / IRRIGATOR) ×1 IMPLANT
KIT PREVENA INCISION MGT 13 (CANNISTER) ×1 IMPLANT
KIT SPINAL PRONEVIEW (KITS) ×1 IMPLANT
LAVAGE JET IRRISEPT WOUND (IRRIGATION / IRRIGATOR) ×1 IMPLANT
MANIFOLD NEPTUNE II (INSTRUMENTS) ×1 IMPLANT
MARKER SKIN DUAL TIP RULER LAB (MISCELLANEOUS) ×1 IMPLANT
NS IRRIG 1000ML POUR BTL (IV SOLUTION) ×1 IMPLANT
PACK LAMINECTOMY ARMC (PACKS) ×1 IMPLANT
PAD ARMBOARD POSITIONER FOAM (MISCELLANEOUS) ×1 IMPLANT
ROD RELINE C 3.5X40 (Rod) IMPLANT
SCREW LOCK RELINE C OPEN (Screw) IMPLANT
SCREW MA RELINE C 3.5X14 (Screw) ×6 IMPLANT
SCREW SP MA RELINE-C 3.5X14 (Screw) IMPLANT
STAPLER SKIN PROX 35W (STAPLE) ×2 IMPLANT
SURGIFLO W/THROMBIN 8M KIT (HEMOSTASIS) ×1 IMPLANT
SUT ETHILON 3-0 FS-10 30 BLK (SUTURE) ×1 IMPLANT
SUT STRATA 3-0 15 PS-2 (SUTURE) IMPLANT
SUT VIC AB 0 CT1 27XCR 8 STRN (SUTURE) ×3 IMPLANT
SUT VIC AB 2-0 CT1 18 (SUTURE) ×2 IMPLANT
SUTURE EHLN 3-0 FS-10 30 BLK (SUTURE) IMPLANT
SYR 30ML LL (SYRINGE) ×2 IMPLANT
TAPE CLOTH 3X10 WHT NS LF (GAUZE/BANDAGES/DRESSINGS) ×2 IMPLANT
TRAP FLUID SMOKE EVACUATOR (MISCELLANEOUS) ×1 IMPLANT
TRAY FOLEY SLVR 16FR LF STAT (SET/KITS/TRAYS/PACK) ×1 IMPLANT

## 2024-01-10 NOTE — Anesthesia Procedure Notes (Signed)
 Procedure Name: Intubation Date/Time: 01/10/2024 10:49 AM  Performed by: Mohammed Kindle, CRNAPre-anesthesia Checklist: Patient identified, Emergency Drugs available, Suction available and Patient being monitored Patient Re-evaluated:Patient Re-evaluated prior to induction Oxygen Delivery Method: Circle system utilized Preoxygenation: Pre-oxygenation with 100% oxygen Induction Type: IV induction Laryngoscope Size: McGrath and 3 Grade View: Grade I Tube type: Oral Tube size: 7.0 mm Number of attempts: 1 Airway Equipment and Method: Stylet Placement Confirmation: ETT inserted through vocal cords under direct vision, positive ETCO2 and breath sounds checked- equal and bilateral Secured at: 21 cm Tube secured with: Tape Dental Injury: Teeth and Oropharynx as per pre-operative assessment

## 2024-01-10 NOTE — Transfer of Care (Signed)
 Immediate Anesthesia Transfer of Care Note  Patient: Ryan [name]  Procedure(s) Performed: C4-5 LAMINECTOMY (Spine Cervical) C4-7 POSTERIOR SPINAL INSTRUMENTATION (Spine Cervical)  Patient Location: PACU  Anesthesia Type:General  Level of Consciousness: awake, drowsy, and patient cooperative  Airway & Oxygen Therapy: Patient Spontanous Breathing and Patient connected to face mask oxygen  Post-op Assessment: Report given to RN and Post -op Vital signs reviewed and stable  Post vital signs: Reviewed and stable  Last Vitals:  Vitals Value Taken Time  BP 140/95 01/10/24 1304  Temp 36.2 C 01/10/24 1304  Pulse 70 01/10/24 1313  Resp 20 01/10/24 1313  SpO2 96 % 01/10/24 1313  Vitals shown include unfiled device data.  Last Pain:  Vitals:   01/10/24 1304  TempSrc:   PainSc: 0-No pain         Complications: No notable events documented.

## 2024-01-10 NOTE — H&P (Signed)
 Referring Physician:  Venetia Night, MD [address] [place],  [place] [address]  Primary Physician:  [name], MD  Referring Physician:  No referring provider defined for this encounter.   DOS: 05/17/23  L4-L5 lumbar decompression   HISTORY OF PRESENT ILLNESS:   01/10/2024 Ryan [name] presents with continued symptoms.   10/26/2023 Ryan [name] reports that he has had ongoing balance issues for the last several months.  He reports that he walks like a drunk.  He began having the symptoms shortly after his surgery in August.  He is walking with a cane.  He has hit his arm many times.  He is concerned about his risk for falls.  10/20/2023 from Drake Leach: Last seen by me on 09/17/23 with complaints of balance issues. He feels like he walks "like a drunk."    MRI of cervical and thoracic spine ordered and phone visit scheduled to discuss them.    No change in his balance. He still feels like it is off. Needs to use a cane and still feels like he does not walk straight. He has unsteadiness in his legs. No neck, mid back, or lower back pain. No arm or leg pain. No numbness, tingling, or weakness.    History of cervical fusion years ago.   He is on ELIQUIS.  Review of Systems:  A 10 point review of systems is negative, except for the pertinent positives and negatives detailed in the HPI.  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; Gleason 5+5; PSA 1.8 --> Tx'd with XRT (12/10/21 - 02/18/22) + LT-ADT; XRT course truncated due to severe radiation colitis   Allergy    Anemia    Anxiety    a.) on BZO PRN (diazepam)   Aortic atherosclerosis (HCC)    Bilateral carotid artery disease (HCC) 03/24/2021   a.) carotid doppler 03/24/2021: 1-39% BICA   BPH with urinary obstruction     CAD (coronary artery disease) 03/26/2011   a.) cCTA 03/26/2011: Ca2+ 505 (pRCA + LM); b.) LHC 04/01/2011: 40-50% oLM, 60-70% m-dLAD, 30% mD2, 60% pRCA - med mgmt; c.) LHC 10/17/2022L 90% oLM, 60% o-pLCx, 95% pRCA, 70% dRCA, 70% RPDA --> CVTS consult; d.) s/p 4v CABG 07/22/2021   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   Chronic diastolic CHF (congestive heart failure) (HCC) 01/14/2017   a.) TTE 01/14/2017: TTE 01/14/2017: EF 55-60%, sev LA dil, triv MR, G1DD; b.) TTE 03/23/2018: EF 55-60%, mod LVH, sev LA dil, mod-sev RA dil; c.) TTE 04/17/2020: EF 60-65%, mod asym ant seg LVH, mild LA dil, mild MR, G1DD; d.) TTE 03/25/2021: EF 50-55%, AoV sclerosis, triv MR   CKD (chronic kidney disease), stage III (HCC)    COPD (chronic obstructive pulmonary disease) (HCC)    DDD (degenerative disc disease), cervical    a.) s/p ACDF C5-C7 06/08/2002   DOE (dyspnea on exertion)    Eczema    Emphysema of lung (HCC)    Family history of adverse reaction to anesthesia    a.) PONV and postoperative delirium/agitation in 1st degree relatives (sisters)   GERD (gastroesophageal reflux disease)    Glaucoma, left eye    Heart murmur    Hemorrhoids    History of adenomatous polyp of colon    History of bilateral cataract extraction 2021  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 Barron Alvine--- dx by biopsy 01-28-2021   Macular  degeneration of both eyes    followed by dr c. Gwendalyn Ege--- bilateral eye injection every 6 wks   NSVT (nonsustained ventricular tachycardia) (HCC) 03/17/2022   a.) Zio patch 03/17/2022: 3 runs with fastest lasting 10 beats at rate of 190 bpm and longest lasting 16 beats at rate of 121 bpm   On apixaban therapy    Osteoporosis 05/31/2016   PAF (paroxysmal atrial fibrillation) (HCC) 03/2012   a.) CHA2DS2VASc = 7 (age x2, CHF, HTN, TIA x2, vascular disease history);  b.) s/p DCCV 04/25/2018 (150J x 1 --> SB); c.) rate/rhythm maintained on oral metoprolol tartrate; chronically anticoagulated with apixaban   Postoperative ileus (HCC) 07/2021   a.) following CABG procedure   RBBB (right bundle branch block)    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   a.) LIMA-LAD, SVG-OM1, sequential SVG-acute marginal-PDA   SNHL (sensorineural hearing loss)    Statin intolerance    Vision loss of left eye    macular degeneration   Vitamin D deficiency     Past Surgical History: Past Surgical History:  Procedure Laterality Date   ANTERIOR CERVICAL DECOMP/DISCECTOMY FUSION  06/08/2002   APPENDECTOMY  1953   CARDIAC CATHETERIZATION Left 04/01/2011   CARDIOVERSION N/A 04/25/2018   Procedure: CARDIOVERSION;  Surgeon: Chilton Si, MD;  Location: [facility] ENDOSCOPY;  Service: Cardiovascular;  Laterality: N/A;   CATARACT EXTRACTION W/ INTRAOCULAR LENS IMPLANT Bilateral 2021   COLONOSCOPY WITH ESOPHAGOGASTRODUODENOSCOPY (EGD)  01/28/2021   by ZOXWRU   CORONARY ARTERY BYPASS GRAFT N/A 07/22/2021   Procedure: 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, ON PUMP, USING LEFT INTERNAL MAMMARY ARTERY AND ENDOSCOPICALLY HARVESTED RIGHT GREATER SAPHENOUS VEIN;  Surgeon: Loreli Slot, MD;  Location: [facility] OR;  Service: Open Heart Surgery;  Laterality: N/A;   ENDOVEIN HARVEST OF GREATER SAPHENOUS VEIN  07/22/2021   Procedure: ENDOVEIN HARVEST OF GREATER SAPHENOUS VEIN;  Surgeon: Loreli Slot, MD;  Location: [facility] OR;   Service: Open Heart Surgery;;   EXTRACORPOREAL SHOCK WAVE LITHOTRIPSY     x2  1990s   EYE SURGERY     FOOT SURGERY Right 1970   calcification removed from top of foot   GOLD SEED IMPLANT N/A 11/28/2021   Procedure: GOLD SEED IMPLANT;  Surgeon: Jerilee Field, MD;  Location: [facility];  Service: Urology;  Laterality: N/A;   KNEE ARTHROSCOPY Right 1991   LAPAROSCOPIC CHOLECYSTECTOMY  2000   LEFT HEART CATH AND CORONARY ANGIOGRAPHY N/A 07/21/2021   Procedure: LEFT HEART CATH AND CORONARY ANGIOGRAPHY;  Surgeon: Runell Gess, MD;  Location: [facility] INVASIVE CV LAB;  Service: Cardiovascular;  Laterality: N/A;   LUMBAR LAMINECTOMY/DECOMPRESSION MICRODISCECTOMY N/A 05/17/2023   Procedure: L4-5 DECOMPRESSION;  Surgeon: Venetia Night, MD;  Location: [facility] ORS;  Service: Neurosurgery;  Laterality: N/A;   SPACE OAR INSTILLATION N/A 11/28/2021   Procedure: SPACE OAR INSTILLATION;  Surgeon: Jerilee Field, MD;  Location: [facility];  Service: Urology;  Laterality: N/A;   TEE WITHOUT CARDIOVERSION N/A 07/22/2021   Procedure: TRANSESOPHAGEAL ECHOCARDIOGRAM (TEE);  Surgeon: Loreli Slot, MD;  Location[facility] OR;  Service: Open Heart Surgery;  Laterality: N/A;   TOTAL KNEE ARTHROPLASTY Right 08/14/2009   @[facility]    TRANSURETHRAL RESECTION OF PROSTATE N/A 07/17/2021   Procedure: TRANSURETHRAL RESECTION OF THE PROSTATE (TURP);  Surgeon: Marcine Matar, MD;  Location: [facility];  Service: Urology;  Laterality: N/A;  1 HR    Allergies: Allergies as of 11/02/2023 - Review Complete 10/26/2023  Allergen Reaction Noted   Albumin (human) Anaphylaxis 04/12/2020   Guaifenesin Anaphylaxis and Other (See Comments) 03/21/2014   Polymyxin b-trimethoprim Swelling 01/28/2012   Pseudoephedrine Other (See Comments) and Hives 03/21/2014   Codeine Hives, Itching, Rash, and Other (See Comments) 03/21/2007   Guaiacol Other (See Comments) 06/08/2013   Polymyxin  b Other (See Comments), Rash, and Swelling 06/08/2013   Statins Other (See Comments) 03/21/2007   Atorvastatin Other (See Comments) 03/21/2007   Brimonidine tartrate Itching and Swelling 10/27/2022   Gabapentin Other (See Comments) 01/28/2021   Meloxicam Other (See Comments) 11/07/2010   Peppermint flavoring agent (non-screening) Other (See Comments) 07/25/2021   Pseudoephedrine-guaifenesin Nausea And Vomiting 02/25/2014   Pseudoephedrine-guaifenesin Other (See Comments) 02/25/2014   Rosuvastatin calcium Other (See Comments) 04/26/2014   Rosuvastatin calcium Other (See Comments) 04/26/2014   Simvastatin Other (See Comments) 03/21/2007   Tapentadol Other (See Comments) 11/07/2010   Ciprofloxacin Hives, Itching, Nausea Only, Rash, and Other (See Comments) 03/21/2007   Moxifloxacin Nausea Only and Other (See Comments) 11/07/2010   Rofecoxib Other (See Comments) 06/08/2013    Medications:  Current Facility-Administered Medications:    0.9 %  sodium chloride infusion, , Intravenous, Continuous, Stephanie Coup, MD   ceFAZolin (ANCEF) IVPB 2g/100 mL premix, 2 g, Intravenous, Once, Venetia Night, MD  Social History: Social History   Tobacco Use   Smoking status: Former    Current packs/day: 0.00    Average packs/day: 0.5 packs/day for 50.0 years (25.0 ttl pk-yrs)    Types: Cigarettes    Start date: 05/02/1959    Quit date: 05/01/2009    Years since quitting: 14.7   Smokeless tobacco: Former    Types: Chew    Quit date: 1995   Tobacco comments:    Former smoker 01/27/22  Vaping Use   Vaping status: Never Used  Substance Use Topics   Alcohol use: Yes    Alcohol/week: 2.0 - 3.0 standard drinks of alcohol    Types: 1 - 2 Glasses of wine, 1 Cans of beer per week    Comment: 1-2 glasses wine/beer daily   Drug use: Never    Family Medical History: Family History  Problem Relation Age of Onset   Heart disease Mother    Diabetes Mother    Cirrhosis Mother    Emphysema  Mother        never smoked but 2nd hand through her spouse   Hypertension Mother    Macular degeneration Mother    Heart disease Father    Cancer Father        prostate   Hyperlipidemia Father    Hypertension Father    Varicose Veins Father    Heart attack Father  Peripheral vascular disease Father    Heart disease Sister    Arthritis Sister    Hyperlipidemia Sister    Obesity Sister    Macular degeneration Sister    Heart disease Brother        5 stents   Hyperlipidemia Brother    Macular degeneration Maternal Grandfather    Cirrhosis Sister    Obesity Sister    Arthritis Sister    Heart disease Sister    Obesity Sister    Liver disease Other    Prostate cancer Other    Coronary artery disease Other    Colon cancer Neg Hx    Esophageal cancer Neg Hx    Rectal cancer Neg Hx    Stomach cancer Neg Hx     Physical Examination: Vitals:   01/10/24 0959  BP: (!) 146/75  Pulse: 87  Resp: 16  Temp: (!) 97.3 F (36.3 C)  SpO2: 97%   Heart sounds normal no MRG. Chest Clear to Auscultation Bilaterally.  General: Patient is in no apparent distress. Attention to examination is appropriate.  Neck:   Supple.  Full range of motion.  Respiratory: Patient is breathing without any difficulty.   NEUROLOGICAL:     Awake, alert, oriented to person, place, and time.  Speech is clear and fluent.   Cranial Nerves: Pupils equal round and reactive to light.  Facial tone is symmetric.  Facial sensation is symmetric. Shoulder shrug is symmetric. Tongue protrusion is midline.   Strength: Side Biceps Triceps Deltoid Interossei Grip Wrist Ext. Wrist Flex.  R 5 5 5 5  4+ 5 5  L 5 5 5 5  4+ 5 5   Side Iliopsoas Quads Hamstring PF DF EHL  R 5 5 5 5 5 5   L 5 5 5 5 5 5    Reflexes are 3+ and symmetric at the biceps, triceps, brachioradialis, patella and achilles.   Hoffman's is present bilaterally.   Bilateral upper and lower extremity sensation is intact to light touch.    No  evidence of dysmetria noted.     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 There is a ventral displacement of the thoracic spinal cord centered at the T6 vertebral body level (series 3, image 6). This appears unchanged compared to prior thoracic spine MRI dated 11/30/2007. Given the relatively focal nature, differential considerations include an arachnoid web and dorsal arachnoid cyst.   Paraspinal and other soft tissues: Negative.   Disc levels:   No evidence of high-grade spinal canal or neural foraminal stenosis.   IMPRESSION: 1. Status post C5-C7 ACDF with solid osseous fusion at C6-C7. No evidence of hardware complications. 2. Severe spinal canal stenosis at C4-C5 with associated cord signal abnormality, which is favored to represent chronic myelomalacia. 3. Severe bilateral neural foraminal narrowing at C4-C5. 4. 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 This is unchanged from prior. 5. 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 Differential considerations include an arachnoid web or dorsal arachnoid cyst.     Electronically Signed   By: Lorenza Cambridge M.D.   On: 10/20/2023 06:44    I have personally reviewed the images and  agree with the above interpretation.  Assessment and Plan: Ryan [name] is a pleasant 78 y.o. 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  He has myelomalacia.  He has severe cervical stenosis.  We reviewed that there is no role for conservative management.  I have recommended surgical intervention with C4-5 posterior decompression with C4-7  instrumentation.    Rosellen Lichtenberger K. Myer Haff MD, [facility] Neurosurgery

## 2024-01-10 NOTE — Consult Note (Signed)
 WOC consulted for intraoperative skin tear, will advise staff to use skin care order set for treatment.  Jami Ohlin [facility], CNS, [employer] [phone]

## 2024-01-10 NOTE — Anesthesia Postprocedure Evaluation (Signed)
 Anesthesia Post Note  Patient: Ryan [name]  Procedure(s) Performed: C4-5 LAMINECTOMY (Spine Cervical) C4-7 POSTERIOR SPINAL INSTRUMENTATION (Spine Cervical)  Patient location during evaluation: PACU Anesthesia Type: General Level of consciousness: awake and alert, oriented and patient cooperative Pain management: pain level controlled Vital Signs Assessment: post-procedure vital signs reviewed and stable Respiratory status: spontaneous breathing, nonlabored ventilation and respiratory function stable Cardiovascular status: blood pressure returned to baseline and stable Postop Assessment: adequate PO intake Anesthetic complications: no   No notable events documented.   Last Vitals:  Vitals:   01/10/24 1405 01/10/24 1415  BP:  116/67  Pulse: 71 81  Resp: 18 (!) 30  Temp:    SpO2: 100% 97%    Last Pain:  Vitals:   01/10/24 1415  TempSrc:   PainSc: 7                  Reed Breech

## 2024-01-10 NOTE — Plan of Care (Signed)
  Problem: Nutrition: Goal: Adequate nutrition will be maintained Outcome: Progressing   Problem: Coping: Goal: Level of anxiety will decrease Outcome: Progressing   Problem: Elimination: Goal: Will not experience complications related to bowel motility Outcome: Progressing   Problem: Pain Managment: Goal: General experience of comfort will improve and/or be controlled Outcome: Progressing

## 2024-01-10 NOTE — Discharge Instructions (Signed)
  Your surgeon has performed an operation on your cervical spine to relieve pressure on one or more nerves. Many times, patients feel better immediately after surgery and can "overdo it." Even if you feel well, it is important that you follow these activity guidelines. If you do not let your back heal properly from the surgery, you can increase the chance of hardware complications and/or return of your symptoms. The following are instructions to help in your recovery once you have been discharged from the hospital.  Do not use NSAIDs for 3 months after surgery.   Activity    No bending, lifting, or twisting ("BLT"). Avoid lifting objects heavier than 10 pounds (gallon milk jug).  Where possible, avoid household activities that involve lifting, bending, pushing, or pulling such as laundry, vacuuming, grocery shopping, and childcare. Try to arrange for help from friends and family for these activities while your back heals.  Increase physical activity slowly as tolerated.  Taking short walks is encouraged, but avoid strenuous exercise. Do not jog, run, bicycle, lift weights, or participate in any other exercises unless specifically allowed by your doctor. Avoid prolonged sitting, including car rides.  Talk to your doctor before resuming sexual activity.  You should not drive until cleared by your doctor.  Until released by your doctor, you should not return to work or school.  You should rest at home and let your body heal.   You may shower three days after your surgery.  After showering, lightly dab your incision dry. Do not take a tub bath or go swimming for 3 weeks, or until approved by your doctor at your follow-up appointment.  If you smoke, we strongly recommend that you quit.  Smoking has been proven to interfere with normal healing in your back and will dramatically reduce the success rate of your surgery. Please contact [facility] (800-QUIT-NOW) and use the resources at www.[facility].com  for assistance in stopping smoking.  Surgical Incision   If you have a dressing on your incision, you may remove it three days after your surgery. Keep your incision area clean and dry.   Diet            You may return to your usual diet. Be sure to stay hydrated.  When to Contact us  Although your surgery and recovery will likely be uneventful, you may have some residual numbness, aches, and pains in your back and/or legs. This is normal and should improve in the next few weeks.  However, should you experience any of the following, contact us immediately: New numbness or weakness Pain that is progressively getting worse, and is not relieved by your pain medications or rest Bleeding, redness, swelling, pain, or drainage from surgical incision Chills or flu-like symptoms Fever greater than 101.0 F (38.3 C) Problems with bowel or bladder functions Difficulty breathing or shortness of breath Warmth, tenderness, or swelling in your calf  Contact Information How to contact us:  If you have any questions/concerns before or after surgery, you can reach [place] at [phone], or you can send a mychart message. We can be reached by phone or mychart 8am-4pm, Monday-Friday.  *Please note: Calls after 4pm are forwarded to a third party answering service. Mychart messages are not routinely monitored during evenings, weekends, and holidays. Please call our office to contact the answering service for urgent concerns during non-business hours.

## 2024-01-10 NOTE — Anesthesia Preprocedure Evaluation (Addendum)
 Anesthesia Evaluation  Patient identified by MRN, date of birth, ID band Patient awake    Reviewed: Allergy & Precautions, NPO status , Patient's Chart, lab work & pertinent test results  History of Anesthesia Complications Negative for: history of anesthetic complications  Airway Mallampati: III   Neck ROM: Full    Dental  (+) Missing   Pulmonary sleep apnea , COPD, former smoker (quit 2010)   Pulmonary exam normal breath sounds clear to auscultation       Cardiovascular hypertension, + CAD (s/p CABG 2022) and +CHF (preserved EF)  Normal cardiovascular exam+ dysrhythmias (a fib on Eliquis)  Rhythm:Regular Rate:Normal  ECG 12/28/23:  Atrial fibrillation Incomplete right bundle branch block Nonspecific ST and T wave abnormality  Myocardial perfusion 04/29/23:  1. Normal left ventricular systolic function with a normal LVEF of 64% 2. Normal myocardial thickening and wall motion 3. Left ventricular cavity size normal 4. SPECT images demonstrate homogenous tracer distribution throughout the myocardium 5. No evidence of stress-induced myocardial ischemia or arrhythmia 6. Normal low risk study  Echo 01/13/22: 1. Left ventricular ejection fraction, by estimation, is 55 to 60%. The left ventricle has normal function. The left ventricle has no regional wall motion abnormalities. Left ventricular diastolic function could not be evaluated.  2. Right ventricular systolic function is normal. The right ventricular size is normal.  3. Left atrial size was severely dilated.  4. Right atrial size was severely dilated.  5. The mitral valve is normal in structure. Mild mitral valve regurgitation. No evidence of mitral stenosis.  6. The aortic valve is normal in structure. Aortic valve regurgitation is not visualized. Aortic valve sclerosis is present, with no evidence of aortic valve stenosis.  7. The inferior vena cava is normal in size with  greater than 50% respiratory variability, suggesting right atrial pressure of 3 mmHg.    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  Preoperative Cardiovascular Risk Reviewed and agree with Ryan [name] pre-anesthesia clinical review note.    Assessment: Ryan [name] perioperative risk of a major cardiac event is 11% according to the Revised Cardiac Risk Index (RCRI).  Therefore, he is at high risk for perioperative complications.   His functional capacity is good at 5.05 METs according to the Duke Activity Status Index (DASI). Recommendations: According to ACC/AHA guidelines, no further cardiovascular testing needed.  The patient may proceed to surgery at acceptable risk.   Antiplatelet and/or Anticoagulation Recommendations: -Eliquis (Apixaban) can be held for 3 days prior to surgery.   -Recommendations for restarting Eliquis reviewed with Dr. Ladona Ridgel. Dr. Ladona Ridgel has recommended that the patient resume his Eliquis no later than 7 days after his surgery to reduce the risk of stroke. If resuming Eliquis 7 days post op is not acceptable, please contact our office.     Cardiology note 04/19/23:  1. Atrial fib - his VR is reasonably well controlled. Continue current meds. 2. CAD - he denies anginal symptoms. He still has non-exertional chest pain. He does have sob and with upcoming surgery, I have recommended a lexiscan myoview. He cannot exercise.  3. HTN -  his bp is fairly well controlled. 4. Coags - he will continue systemic anti-coag with eliquis. 5. Preop eval - He is an acceptable surgical candidate, provided the lexiscan is not high  risk. With regard to his systemic anti-coagulation, the patient has normal kidney function. Stopping eliquis will result in normal coag after 2 days. However, he may stop the eliquis for up to 4 days before his surgery. Half life is 8 hours. He can start the eliquis back when he is felt to be a low risk for post op bleeding.    Reproductive/Obstetrics                             Anesthesia Physical Anesthesia Plan  ASA: 4  Anesthesia Plan: General   Post-op Pain Management:    Induction: Intravenous  PONV Risk Score and Plan: 2 and Ondansetron, Dexamethasone and Treatment may vary due to age or medical condition  Airway Management Planned: Oral ETT  Additional Equipment:   Intra-op Plan:   Post-operative Plan: Extubation in OR  Informed Consent: I have reviewed the patients History and Physical, chart, labs and discussed the procedure including the risks, benefits and alternatives for the proposed anesthesia with the patient or authorized representative who has indicated his/her understanding and acceptance.     Dental advisory given  Plan Discussed with: CRNA  Anesthesia Plan Comments: (Patient consented for risks of anesthesia including but not limited to:  - adverse reactions to medications - damage to eyes, teeth, lips or other oral mucosa - nerve damage due to positioning  - sore throat or hoarseness - damage to heart, brain, nerves, lungs, other parts of body or loss of life  Informed patient about role of CRNA in peri- and intra-operative care.  Patient voiced understanding.)        Anesthesia Quick Evaluation

## 2024-01-10 NOTE — Op Note (Signed)
 Indications: Mr. Joandy Burget is a 78 y.o. 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   Preoperative Note:   Risks of surgery discussed include: infection, bleeding, stroke, coma, death, paralysis, CSF leak, nerve/spinal cord injury, numbness, tingling, weakness, complex regional pain syndrome, recurrent stenosis and/or disc herniation, vascular injury, development of instability, neck/back pain, need for further surgery, persistent symptoms, development of deformity, and the risks of anesthesia. The patient understood these risks and agreed to proceed.  Operative Note:   OPERATIVE PROCEDURE:  1. Posterior Segmental Instrumentation C4-6 using Nuvasive Reline C 2. Posterolateral arthrodesis from C4-6 3. Cervical Laminectomy from C4-C5 for decompression of the spinal cord 4. Harvesting of autograft via the same incision 5. Use of flouroscopy   OPERATIVE PROCEDURE:  After induction of general anesthesia, the patient was placed in the prone position on the operative table.  A midline incision was then planned using fluoroscopy.  A timeout was performed, and antibiotics given.  Next, the posterior cervical region was prepped and draped in the usual sterile fashion. The incision was injected with local anesthetic, then opened sharply. A subperiosteal dissection was then carried out to expose the posterior elements from C4 and C6, with careful attention paid to maintaining the C3/4 facet capsule.  The C6-7 facets were inspected and found to be well fused.  Thus, intraoperative decision was made to restrict the  construct to C4-C6.  After satisfactory exposure had been obtained, our attention was turned to placement of lateral mass screws.  On each side, the high speed drill was used to remove the soft tissue of the facet from C4/5 to C5/6. Lateral mass screws were then placed at each level using a modified Magerl technique. Briefly, a pilot hole was drilled in the lateral mass using the high-speed drill on each side.  Next, a drill was used to drill a tract in each lateral mass to 14mm. A balltip probe was used to confirm lack of breach. We then placed 3.5x 14 mm screws at C4, 3.5x 14 mm screws at C5, and 3.5x 14 mm screws at C6.  Rods were measured and shaped, then secured to the screws according to manufacturer's specifications.  After placement of the lateral mass screws, the high speed drill was used to drill trough laminectomies at C4 and C5.  The 2 and 3 mm punches were used to remove the remainder of the ligamentum flavum.  The spinous processes were then disconnected and the posterior elements removed.  The 2 mm punch was then used to complete the decompression at C4-5.  Monitoring was checked and noted to be stable.    After decompression was complete, final AP and lateral radiographs were taken.  The wound was copiously irrigated and hemostasis was achieved.  Using high-speed drill, the lateral margin of the lateral masses was gently decorticated.  The bone harvested from the laminectomy was processed and placed posterolaterally and in the facet joints to aid in arthrodesis from C4 to C6.  A Hemovac drain was then placed in the wound deep to the fascia.   The wound was closed in a multilayer fashion using interrupted 0 and 2-0 Vicryl sutures.  The final  skin edges were reapproximated using a 3-0 monocryl.  A wound vac was placed.  After closure, the patient was flipped supine. Patient was then handed back over to anesthesia.  All counts were correct at the conclusion of the procedure.  Neurological  monitoring was used throughout, and there were no changes.  Manning Charity PA acted as an Designer, television/film set throughout the case. An assistant was required for this procedure due to the complexity.  The assistant provided assistance in tissue manipulation and suction, and was required for the successful and safe performance of the procedure. I performed the critical portions of the procedure.   Venetia Night MD

## 2024-01-11 ENCOUNTER — Encounter: Payer: Self-pay | Admitting: Neurosurgery

## 2024-01-11 LAB — GLUCOSE, CAPILLARY: Glucose-Capillary: 112 mg/dL — ABNORMAL HIGH (ref 70–99)

## 2024-01-11 NOTE — Progress Notes (Addendum)
 Physical Therapy Treatment Patient Details Name: KAIUS DAINO MRN: [id number] DOB: 1945-10-07 Today's Date: 01/11/2024   History of Present Illness Pt is a 78 y.o. male s/p 01/10/24 C4-5 decompression and C4-6 PSF d/t cervical myelopathy.  PMH includes sleep apnea, COPD, htn, CAD, CHF, a-fib, CVA, anemia, CABG x4, vision loss L eye, ACDF, lumbar sx, R TKA, TURP.    PT Comments  Pt resting in bed upon PT arrival; pt agreeable to therapy.  7/10 neck pain reported beginning/end of session (nurse notified; pt reporting recent pain medication).  During session pt SBA with bed mobility via logrolling; CGA with transfers using RW; CGA with ambulation 120 feet with RW use; and CGA to navigate 8 steps with railing use.  Nursing reports plan to change current hard collar to [place]-J (pt reports d/t not fitting well).  Will continue to progress pt with progressive functional mobility during hospitalization.    If plan is discharge home, recommend the following: A little help with walking and/or transfers;A little help with bathing/dressing/bathroom;Assistance with cooking/housework;Assist for transportation;Help with stairs or ramp for entrance   Can travel by private vehicle        Equipment Recommendations  Rolling walker (2 wheels) (pt reports having RW at home already)    Recommendations for Other Services       Precautions / Restrictions Precautions Precautions: Fall;Cervical Recall of Precautions/Restrictions: Intact Precaution/Restrictions Comments: Hard c-collar when OOB and ambulating; Ok to remove when in bed; Fragile skin B UE's Required Braces or Orthoses: Cervical Brace Cervical Brace: Hard collar Restrictions Weight Bearing Restrictions Per Provider Order: No     Mobility  Bed Mobility Overal bed mobility: Needs Assistance Bed Mobility: Rolling, Sidelying to Sit, Sit to Sidelying Rolling: Supervision Sidelying to sit: Supervision, HOB elevated     Sit to sidelying:  Supervision, Used rails General bed mobility comments: vc's for technique    Transfers Overall transfer level: Needs assistance Equipment used: Rolling walker (2 wheels) Transfers: Sit to/from Stand Sit to Stand: Contact guard assist           General transfer comment: vc's for UE placement; x1 trial standing from bed and x1 trial standing from recliner    Ambulation/Gait Ambulation/Gait assistance: Contact guard assist Gait Distance (Feet): 120 Feet Assistive device: Rolling walker (2 wheels) Gait Pattern/deviations: Step-through pattern, Decreased step length - right, Decreased step length - left Gait velocity: decreased     General Gait Details: steady ambulation with RW use   Stairs Stairs: Yes (pt brought to stairs via recliner) Stairs assistance: Contact guard assist Stair Management: One rail Right, Step to pattern, Forwards Number of Stairs: 8 General stair comments: initial vc's for LE sequencing; steady stairs navigation; increased SOB noted (SpO2 sats 93% or greater on room air)   Wheelchair Mobility     Tilt Bed    Modified Rankin (Stroke Patients Only)       Balance Overall balance assessment: Needs assistance Sitting-balance support: No upper extremity supported, Feet supported Sitting balance-Leahy Scale: Good Sitting balance - Comments: steady reaching within BOS   Standing balance support: Bilateral upper extremity supported, During functional activity, Reliant on assistive device for balance Standing balance-Leahy Scale: Good Standing balance comment: steady ambulating with RW use                            Communication Communication Communication: No apparent difficulties  Cognition Arousal: Alert Behavior During Therapy: [facility] for tasks assessed/performed  PT - Cognitive impairments: No apparent impairments                         Following commands: Intact      Cueing Cueing Techniques: Verbal cues   Exercises      General Comments Nursing cleared pt for participation in physical therapy.  Pt agreeable to PT session.      Pertinent Vitals/Pain Pain Assessment Pain Assessment: 0-10 Pain Score: 7  Pain Location: neck Pain Descriptors / Indicators: Discomfort, Aching Pain Intervention(s): Limited activity within patient's tolerance, Monitored during session, Premedicated before session, Repositioned (pt reports recent pain medication) HR 83-87 bpm and SpO2 sats 93% or greater on room air during sessions activities.    Home Living      Prior Function            PT Goals (current goals can now be found in the care plan section) Acute Rehab PT Goals Patient Stated Goal: to improve pain and walking PT Goal Formulation: With patient Time For Goal Achievement: 01/25/24 Potential to Achieve Goals: Good Progress towards PT goals: Progressing toward goals    Frequency    BID      PT Plan      Co-evaluation              AM-PAC PT "6 Clicks" Mobility   Outcome Measure  Help needed turning from your back to your side while in a flat bed without using bedrails?: A Little Help needed moving from lying on your back to sitting on the side of a flat bed without using bedrails?: A Little Help needed moving to and from a bed to a chair (including a wheelchair)?: A Little Help needed standing up from a chair using your arms (e.g., wheelchair or bedside chair)?: A Little Help needed to walk in hospital room?: A Little Help needed climbing 3-5 steps with a railing? : A Little 6 Click Score: 18    End of Session Equipment Utilized During Treatment: Gait belt;Cervical collar Activity Tolerance: Patient tolerated treatment well Patient left: in bed;with call bell/phone within reach;with bed alarm set;with SCD's reapplied Nurse Communication: Mobility status;Other (comment);Precautions (Pt's pain status) PT Visit Diagnosis: Other abnormalities of gait and mobility  (R26.89);Muscle weakness (generalized) (M62.81);History of falling (Z91.81);Pain Pain - Right/Left:  (posterior neck) Pain - part of body:  (neck)     Time: 1610-9604 PT Time Calculation (min) (ACUTE ONLY): 33 min  Charges:    $Gait Training: 8-22 mins $Therapeutic Activity: 8-22 mins PT General Charges $$ ACUTE PT VISIT: 1 Visit                     Hendricks Limes, PT 01/11/24, 3:31 PM

## 2024-01-11 NOTE — Progress Notes (Signed)
   Neurosurgery Progress Note  History: Ryan [name] is s/p C4-5 decompression and C4-6 PSF  POD1: Doing ok this morning. Tired from working with therapy. Overall feels his pain is relatively controlled  Physical Exam: Vitals:   01/11/24 0323 01/11/24 0800  BP: (!) 143/65 130/88  Pulse: 73 78  Resp: 18 16  Temp: 97.8 F (36.6 C) 98.3 F (36.8 C)  SpO2: 95% 96%    AA Ox3 CNI   Strength: Side Biceps Triceps Deltoid Interossei Grip Wrist Ext. Wrist Flex.  R 5 5 5 5  4+ 5 5  L 5 5 5 5  4+ 5 5    Side Iliopsoas Quads Hamstring PF DF EHL  R 5 5 5 5 5 5   L 5 5 5 5 5 5    HV 110 since surgery  Data:  Other tests/results: NA  Assessment/Plan:  Ryan [name] is a 78 y.o presenting with cervical pseudoarthrosis and stenosis with myelopathy s/p cervical decompression and fusion.  - mobilize - pain control - DVT prophylaxis - will continue HV for now given overnight output - PTOT; cervical collar ordered. To be worn when OOB and ambulating. OK to work with therapy prior to delivery. Will ask to switch the [place] collar out for a [place] J. - wound care consulted for skin tears. We appreciate your assistance  Manning Charity PA-C Department of Neurosurgery

## 2024-01-11 NOTE — Plan of Care (Signed)
  Problem: Education: Goal: Knowledge of General Education information will improve Description: Including pain rating scale, medication(s)/side effects and non-pharmacologic comfort measures Outcome: Progressing   Problem: Clinical Measurements: Goal: Will remain free from infection Outcome: Progressing   Problem: Clinical Measurements: Goal: Respiratory complications will improve Outcome: Progressing   Problem: Clinical Measurements: Goal: Cardiovascular complication will be avoided Outcome: Progressing   Problem: Activity: Goal: Risk for activity intolerance will decrease Outcome: Progressing   Problem: Pain Managment: Goal: General experience of comfort will improve and/or be controlled Outcome: Progressing   Problem: Safety: Goal: Ability to remain free from injury will improve Outcome: Progressing

## 2024-01-11 NOTE — Evaluation (Signed)
 Physical Therapy Evaluation Patient Details Name: Ryan [name] MRN: [id number] DOB: June 13, 1946 Today's Date: 01/11/2024  History of Present Illness  Pt is a 78 y.o. male s/p 01/10/24 C4-5 decompression and C4-6 PSF d/t cervical myelopathy.  PMH includes sleep apnea, COPD, htn, CAD, CHF, a-fib, CVA, anemia, CABG x4, vision loss L eye, ACDF, lumbar sx, R TKA, TURP.  Clinical Impression  Prior to surgery, pt reports being modified independent with ambulation using SPC; one recent fall reported; lives with his wife on main level of home with 2 STE R railing.  Posterior neck pain 6/10 at rest beginning of session and 7.5/10 end of session at rest (nurse present end of session with pain medication).  Currently pt is SBA with bed mobility via logrolling; CGA with transfer from bed using RW; and CGA to ambulate 120 feet with RW use.  Pt reports h/o getting dizzy during ambulation (comes on suddenly) but pt reported no dizziness during sessions activities.  Pt reports he used [facility] with staff earlier to go to the bathroom but felt better (more steady/safe) using RW for ambulation during session.  Pt would currently benefit from skilled PT to address noted impairments and functional limitations (see below for any additional details).  Upon hospital discharge, pt would benefit from ongoing therapy.     If plan is discharge home, recommend the following: A little help with walking and/or transfers;A little help with bathing/dressing/bathroom;Assistance with cooking/housework;Assist for transportation;Help with stairs or ramp for entrance   Can travel by private vehicle        Equipment Recommendations Rolling walker (2 wheels) (pt reports having RW at home already)  Recommendations for Other Services       Functional Status Assessment Patient has had a recent decline in their functional status and demonstrates the ability to make significant improvements in function in a reasonable and predictable amount of  time.     Precautions / Restrictions Precautions Precautions: Fall;Cervical Recall of Precautions/Restrictions: Intact Precaution/Restrictions Comments: Hard c-collar when OOB and ambulating; Ok to remove when in bed; Fragile skin B UE's Required Braces or Orthoses: Cervical Brace Cervical Brace: Hard collar Restrictions Weight Bearing Restrictions Per Provider Order: No      Mobility  Bed Mobility Overal bed mobility: Needs Assistance Bed Mobility: Rolling, Sidelying to Sit Rolling: Supervision Sidelying to sit: Supervision, HOB elevated       General bed mobility comments: vc's for technique    Transfers Overall transfer level: Needs assistance Equipment used: Rolling walker (2 wheels) Transfers: Sit to/from Stand Sit to Stand: Contact guard assist           General transfer comment: vc's for UE placement; x1 trial standing from bed    Ambulation/Gait Ambulation/Gait assistance: Contact guard assist Gait Distance (Feet): 120 Feet Assistive device: Rolling walker (2 wheels) Gait Pattern/deviations: Step-through pattern, Decreased step length - right, Decreased step length - left Gait velocity: decreased     General Gait Details: steady ambulation with RW use  Stairs            Wheelchair Mobility     Tilt Bed    Modified Rankin (Stroke Patients Only)       Balance Overall balance assessment: Needs assistance Sitting-balance support: No upper extremity supported, Feet supported Sitting balance-Leahy Scale: Good Sitting balance - Comments: steady reaching within BOS   Standing balance support: Bilateral upper extremity supported, During functional activity, Reliant on assistive device for balance Standing balance-Leahy Scale: Good Standing balance comment: steady ambulating  with RW use                             Pertinent Vitals/Pain Pain Assessment Pain Assessment: 0-10 Pain Score:  (7.5/10) Pain Location: neck Pain  Descriptors / Indicators: Discomfort, Aching Pain Intervention(s): Limited activity within patient's tolerance, Monitored during session, Premedicated before session, Repositioned (RN present giving pain medication end of session) HR fluctuating between 74-89 bpm and SpO2 sats 94% or greater on room air during sessions activities.    Home Living Family/patient expects to be discharged to:: Private residence Living Arrangements: Spouse/significant other Available Help at Discharge: Family Type of Home: House Home Access: Stairs to enter Entrance Stairs-Rails: Right Entrance Stairs-Number of Steps: 2   Home Layout: Two level;Able to live on main level with bedroom/bathroom Home Equipment: Cane - single point;Rolling Walker (2 wheels);BSC/3in1;Shower seat;Hand held shower head;Grab bars - tub/shower;Shower seat - built in      Prior Function Prior Level of Function : Independent/Modified Independent             Mobility Comments: Modified independent ambulating with SPC; 1 recent fall (went to ER)--fell forward       Extremity/Trunk Assessment   Upper Extremity Assessment Upper Extremity Assessment: Defer to OT evaluation    Lower Extremity Assessment Lower Extremity Assessment:  (B LE hip flexion, knee flexion/extension, and DF 4+/5; intact B LE heel to shin coordination (slower movement noted though))    Cervical / Trunk Assessment Cervical / Trunk Assessment: Neck Surgery  Communication   Communication Communication: No apparent difficulties    Cognition Arousal: Alert Behavior During Therapy: WFL for tasks assessed/performed   PT - Cognitive impairments: No apparent impairments                         Following commands: Intact       Cueing Cueing Techniques: Verbal cues     General Comments General comments (skin integrity, edema, etc.): drainage noted posterior neck dressings; hemovac in place.  Nursing cleared pt for participation in physical  therapy.  Pt agreeable to PT session.    Exercises  Transfer and gait training.   Assessment/Plan    PT Assessment Patient needs continued PT services  PT Problem List Decreased strength;Decreased activity tolerance;Decreased balance;Decreased mobility;Decreased knowledge of use of DME;Decreased knowledge of precautions;Pain       PT Treatment Interventions DME instruction;Gait training;Stair training;Functional mobility training;Therapeutic activities;Therapeutic exercise;Balance training;Patient/family education    PT Goals (Current goals can be found in the Care Plan section)  Acute Rehab PT Goals Patient Stated Goal: to improve pain and walking PT Goal Formulation: With patient Time For Goal Achievement: 01/25/24 Potential to Achieve Goals: Good    Frequency BID     Co-evaluation               AM-PAC PT "6 Clicks" Mobility  Outcome Measure Help needed turning from your back to your side while in a flat bed without using bedrails?: A Little Help needed moving from lying on your back to sitting on the side of a flat bed without using bedrails?: A Little Help needed moving to and from a bed to a chair (including a wheelchair)?: A Little Help needed standing up from a chair using your arms (e.g., wheelchair or bedside chair)?: A Little Help needed to walk in hospital room?: A Little Help needed climbing 3-5 steps with a railing? : A Little 6  Click Score: 18    End of Session Equipment Utilized During Treatment: Gait belt;Cervical collar Activity Tolerance: Patient tolerated treatment well Patient left: in chair;with call bell/phone within reach;with chair alarm set;with nursing/sitter in room;with SCD's reapplied Nurse Communication: Mobility status;Other (comment);Precautions (Pt's pain status) PT Visit Diagnosis: Other abnormalities of gait and mobility (R26.89);Muscle weakness (generalized) (M62.81);History of falling (Z91.81);Pain Pain - Right/Left:  (posterior  neck) Pain - part of body:  (neck)    Time: 1610-9604 PT Time Calculation (min) (ACUTE ONLY): 28 min   Charges:   PT Evaluation $PT Eval Low Complexity: 1 Low PT Treatments $Therapeutic Activity: 8-22 mins PT General Charges $$ ACUTE PT VISIT: 1 Visit        Hendricks Limes, PT 01/11/24, 10:18 AM

## 2024-01-11 NOTE — TOC Progression Note (Signed)
 Transition of Care [facility]) - Progression Note    Patient Details  Name: Ryan [name] MRN: [id number] Date of Birth: 19-Aug-1946  [facility] CM/SW Contact  Marlowe Sax, RN Phone Number: 01/11/2024, 4:30 PM  Clinical Narrative:    Spoke with the patient to discuss DC plan and needs He lives at home with his wife He has had HH before and used [facility], he would like to use them again, sent the referral to [facility] he has DME at home including single point;Rolling Walker (2 wheels);BSC/3in1;Shower seat;Hand held shower head;Grab bars - tub/shower;Shower seat - built in  His sister in law will provide transportation home   Expected Discharge Plan: Home w Home Health Services Barriers to Discharge: No Barriers Identified  Expected Discharge Plan and Services   Discharge Planning Services: CM Consult   Living arrangements for the past 2 months: Single Family Home                 DME Arranged: N/A DME Agency: NA       HH Arranged: PT, OT HH Agency: [facility] Date HH Agency Contacted: 01/11/24 Time HH Agency Contacted: 1629 Representative spoke with at [facility] Agency: [place]   Social Determinants of Health (SDOH) Interventions SDOH Screenings   Food Insecurity: No Food Insecurity (10/09/2023)  Housing: Low Risk  (01/10/2024)  Transportation Needs: No Transportation Needs (01/10/2024)  Utilities: Not At Risk (01/10/2024)  Alcohol Screen: Low Risk  (10/09/2023)  Depression (PHQ2-9): Low Risk  (03/23/2023)  Financial Resource Strain: Low Risk  (10/09/2023)  Physical Activity: Insufficiently Active (10/09/2023)  Social Connections: Moderately Isolated (01/10/2024)  Stress: No Stress Concern Present (10/09/2023)  Tobacco Use: Medium Risk (01/10/2024)    Readmission Risk Interventions    01/12/2022    8:35 AM 08/01/2021   10:18 AM  Readmission Risk Prevention Plan  Transportation Screening Complete Complete  PCP or Specialist  Appt within 3-5 Days Complete Complete  HRI or Home Care Consult Complete Complete  Social Work Consult for Recovery Care Planning/Counseling  Complete  Palliative Care Screening Not Applicable Not Applicable  Medication Review Oceanographer)  Complete

## 2024-01-11 NOTE — Evaluation (Signed)
 Occupational Therapy Evaluation Patient Details Name: Ryan [name] MRN: [id number] DOB: 10-22-1945 Today's Date: 01/11/2024   History of Present Illness   Pt is a 78 y.o. male s/p 01/10/24 C4-5 decompression and C4-6 PSF d/t cervical myelopathy.  PMH includes sleep apnea, COPD, htn, CAD, CHF, a-fib, CVA, anemia, CABG x4, vision loss L eye, ACDF, lumbar sx, R TKA, TURP.     Clinical Impressions PTA, pt lives with spouse and requires mod- max assist for LB dressing but is otherwise MOD I with ADLs/IADLs including driving. 1 recent fall. Pt received seated in recliner, cervical collar donned prior to mobility/ADL assessment. Per conversation with PA, [place] J hard cervical collar has been ordered to replace [place] collar in room. Notified MD/PA/RN of pt's report of new onset bilateral hand shakiness and decreased coordination after surgery - impairments in FMC/GMC noted after completing finger-to-nose testing and digit opposition assessment.    Pt educated in cervical precautions with handout provided, self care skills, bed mobility, functional transfer training, AE/DME for ADL needs, home/routines modifications and falls prevention strategies to maximize safety and functional independence while maintaining precautions. Currently pt is at [facility] level using RW t/f bathroom for toileting tasks (reg toilet + GB)  and max assist for LB ADL tasks. Pt verbalized understanding of all education/training provided. Pt will currently benefit from skilled OT services to address functional impairments to decrease caregiver burden and reduce risk of falls at hospital discharge. OT will continue to follow acutely.      If plan is discharge home, recommend the following:   A little help with walking and/or transfers;A lot of help with bathing/dressing/bathroom;Assistance with cooking/housework;Assist for transportation     Functional Status Assessment   Patient has had a recent decline in their functional  status and demonstrates the ability to make significant improvements in function in a reasonable and predictable amount of time.     Equipment Recommendations   None recommended by OT      Precautions/Restrictions   Precautions Precautions: Fall;Cervical Recall of Precautions/Restrictions: Intact Precaution/Restrictions Comments: Hard c-collar when OOB and ambulating; Ok to remove when in bed; Fragile skin B UE's Required Braces or Orthoses: Cervical Brace Cervical Brace: Hard collar (on when OOB and walking, ok to don seated. [place] J ordered to replace one in room) Restrictions Weight Bearing Restrictions Per Provider Order: No     Mobility Bed Mobility Overal bed mobility: Needs Assistance Bed Mobility: Rolling, Sit to Sidelying Rolling: Supervision       Sit to sidelying: Supervision, HOB elevated, Used rails      Transfers Overall transfer level: Needs assistance Equipment used: Rolling walker (2 wheels) Transfers: Sit to/from Stand Sit to Stand: Contact guard assist                  Balance Overall balance assessment: Needs assistance Sitting-balance support: No upper extremity supported, Feet supported Sitting balance-Leahy Scale: Good     Standing balance support: Bilateral upper extremity supported, During functional activity, Reliant on assistive device for balance Standing balance-Leahy Scale: Good Standing balance comment: steady ambulating with RW use                           ADL either performed or assessed with clinical judgement   ADL Overall ADL's : Needs assistance/impaired                     Lower Body Dressing: Maximal assistance;Sit to/from stand  Lower Body Dressing Details (indicate cue type and reason): max A to don underwear Toilet Transfer: Contact guard assist;Rolling walker (2 wheels);Ambulation;Cueing for sequencing;Regular Teacher, adult education Details (indicate cue type and reason): t/f bathroom  using RW, no LOB, cues for safety/sequencing for doffing pants Toileting- Clothing Manipulation and Hygiene: Contact guard assist;Sitting/lateral lean       Functional mobility during ADLs: Contact guard assist;Rolling walker (2 wheels)       Vision Baseline Vision/History: 6 Macular Degeneration;1 Wears glasses (Pt wears glasses for reading, reports mac degeneration with frequent eye injections but 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.) Ability to See in Adequate Light: 0 Adequate Patient Visual Report: No change from baseline Vision Assessment?: Wears glasses for reading            Pertinent Vitals/Pain Pain Assessment Pain Assessment: 0-10 Pain Score: 6  Pain Location: neck Pain Descriptors / Indicators: Discomfort, Aching Pain Intervention(s): Limited activity within patient's tolerance, Monitored during session, Premedicated before session     Extremity/Trunk Assessment Upper Extremity Assessment Upper Extremity Assessment: Right hand dominant;RUE deficits/detail;LUE deficits/detail RUE Deficits / Details: Pt reporting mild "shakiness" of both hands, decreased finger to nose testing. MMT not formally assessed due to neck pain. Able to raise both UE 0-80 deg during functional task performance. RUE Coordination: decreased gross motor;decreased fine motor LUE Coordination: decreased fine motor;decreased gross motor   Lower Extremity Assessment Lower Extremity Assessment: Defer to PT evaluation;Overall [facility] for tasks assessed   Cervical / Trunk Assessment Cervical / Trunk Assessment: Neck Surgery   Communication Communication Communication: No apparent difficulties   Cognition Arousal: Alert Behavior During Therapy: WFL for tasks assessed/performed Cognition: No apparent impairments                               Following commands: Intact       Cueing  General Comments   Cueing Techniques: Verbal cues  Secure chat to PA/MD/RN regarding pt c/o BUE hand  shakiness           Home Living Family/patient expects to be discharged to:: Private residence Living Arrangements: Spouse/significant other Available Help at Discharge: Family Type of Home: House Home Access: Stairs to enter [employer] of Steps: 2 Entrance Stairs-Rails: Right Home Layout: Two level;Able to live on main level with bedroom/bathroom Alternate Level Stairs-Number of Steps: 12   Bathroom Shower/Tub: Producer, television/film/video: Handicapped height Bathroom Accessibility: Yes   Home Equipment: Cane - single Librarian, academic (2 wheels);BSC/3in1;Shower seat;Hand held shower head;Grab bars - tub/shower;Shower seat - built in          Prior Functioning/Environment Prior Level of Function : Independent/Modified Independent             Mobility Comments: Modified independent ambulating with SPC; 1 recent fall (went to ER)--fell forward ADLs Comments: spouse provides assist for LB dressing, pt drives    OT Problem List: Decreased coordination;Pain;Impaired UE functional use;Impaired vision/perception;Impaired balance (sitting and/or standing);Decreased range of motion;Decreased strength;Decreased knowledge of precautions   OT Treatment/Interventions: Self-care/ADL training;Neuromuscular education;DME and/or AE instruction;Therapeutic activities;Patient/family education;Balance training      OT Goals(Current goals can be found in the care plan section)   Acute Rehab OT Goals OT Goal Formulation: With patient Time For Goal Achievement: 01/25/24 Potential to Achieve Goals: Good   OT Frequency:  Min 2X/week       AM-PAC OT "6 Clicks" Daily Activity     Outcome Measure Help from  another person eating meals?: A Little Help from another person taking care of personal grooming?: A Little Help from another person toileting, which includes using toliet, bedpan, or urinal?: A Little Help from another person bathing (including washing, rinsing,  drying)?: A Little Help from another person to put on and taking off regular upper body clothing?: A Little Help from another person to put on and taking off regular lower body clothing?: A Little 6 Click Score: 18   End of Session Equipment Utilized During Treatment: Gait belt;Rolling walker (2 wheels);Cervical collar Nurse Communication: Mobility status  Activity Tolerance: Patient tolerated treatment well Patient left: in bed;with call bell/phone within reach;with bed alarm set;with SCD's reapplied  OT Visit Diagnosis: Other abnormalities of gait and mobility (R26.89);Muscle weakness (generalized) (M62.81)                Time: 1610-9604 OT Time Calculation (min): 37 min Charges:  OT General Charges $OT Visit: 1 Visit OT Evaluation $OT Eval Low Complexity: 1 Low OT Treatments $Self Care/Home Management : 23-37 mins  Tuwanna Krausz L. Florence Yeung, OTR/L  01/11/24, 1:31 PM

## 2024-01-12 ENCOUNTER — Other Ambulatory Visit: Payer: Self-pay

## 2024-01-12 ENCOUNTER — Encounter: Payer: Self-pay | Admitting: Neurosurgery

## 2024-01-12 MED ORDER — TIZANIDINE HCL 4 MG PO TABS
4.0000 mg | ORAL_TABLET | Freq: Four times a day (QID) | ORAL | 0 refills | Status: DC | PRN
  Filled 2024-01-12: qty 60, 15d supply, fill #0

## 2024-01-12 MED ORDER — OXYCODONE HCL 5 MG PO TABS
5.0000 mg | ORAL_TABLET | ORAL | 0 refills | Status: DC | PRN
  Filled 2024-01-12: qty 30, 5d supply, fill #0

## 2024-01-12 NOTE — Plan of Care (Signed)

## 2024-01-12 NOTE — Progress Notes (Signed)
 Physical Therapy Treatment Patient Details Name: Ryan [name] MRN: [id number] DOB: 20-Jan-1946 Today's Date: 01/12/2024   History of Present Illness Pt is a 78 y.o. male s/p 01/10/24 C4-5 decompression and C4-6 PSF d/t cervical myelopathy.  PMH includes sleep apnea, COPD, htn, CAD, CHF, a-fib, CVA, anemia, CABG x4, vision loss L eye, ACDF, lumbar sx, R TKA, TURP.    PT Comments  Pt was sitting in recliner upon arrival. He was premedicated for pain prior to session. Pt agrees to session. Did endorse wanting to stay one more night however said he could DC home this afternoon if the MDs cleared him too. Pt demonstrates safe abilities to apply cervical collar, stand, and ambulate with RW > 200 ft.  No LOB but pt endorses more severe pain today versus previous date. Overall, pt continues to progress well towards all PT goals.    If plan is discharge home, recommend the following: A little help with walking and/or transfers;A little help with bathing/dressing/bathroom;Assistance with cooking/housework;Assist for transportation;Help with stairs or ramp for entrance     Equipment Recommendations  Rolling walker (2 wheels)       Precautions / Restrictions Precautions Precautions: Fall;Cervical Recall of Precautions/Restrictions: Intact Precaution/Restrictions Comments: Hard c-collar when OOB and ambulating; Ok to remove when in bed; Fragile skin B UE's Required Braces or Orthoses: Cervical Brace Cervical Brace: Hard collar Restrictions Weight Bearing Restrictions Per Provider Order: No     Mobility  Bed Mobility Overal bed mobility: Needs Assistance Bed Mobility: Rolling, Sidelying to Sit, Sit to Sidelying Rolling: Supervision Sidelying to sit: Supervision, HOB elevated   Transfers Overall transfer level: Needs assistance Equipment used: Rolling walker (2 wheels) Transfers: Sit to/from Stand Sit to Stand: Supervision  General transfer comment: no physical assistance to stand from  recliner    Ambulation/Gait Ambulation/Gait assistance: Supervision Gait Distance (Feet): 200 Feet Assistive device: Rolling walker (2 wheels) Gait Pattern/deviations: Step-through pattern Gait velocity: decreased  General Gait Details: pt demonstrated safe abilities to ambulate 200 ft with RW without LOB or safety concern   Balance Overall balance assessment: Needs assistance Sitting-balance support: No upper extremity supported, Feet supported Sitting balance-Leahy Scale: Good     Standing balance support: Bilateral upper extremity supported, During functional activity, Reliant on assistive device for balance Standing balance-Leahy Scale: Good Standing balance comment: steady ambulating with RW use       Communication Communication Communication: No apparent difficulties  Cognition Arousal: Alert Behavior During Therapy: WFL for tasks assessed/performed   PT - Cognitive impairments: No apparent impairments    PT - Cognition Comments: Pt is A and O x 4 Following commands: Intact      Cueing Cueing Techniques: Visual cues         Pertinent Vitals/Pain Pain Assessment Pain Assessment: 0-10 Pain Score: 5  Pain Location: neck Pain Descriptors / Indicators: Discomfort, Aching Pain Intervention(s): Limited activity within patient's tolerance, Monitored during session, Premedicated before session, Repositioned     PT Goals (current goals can now be found in the care plan section) Acute Rehab PT Goals Patient Stated Goal: go home tomorrow Progress towards PT goals: Progressing toward goals    Frequency    BID       AM-PAC PT "6 Clicks" Mobility   Outcome Measure  Help needed turning from your back to your side while in a flat bed without using bedrails?: A Little Help needed moving from lying on your back to sitting on the side of a flat bed without using  bedrails?: A Little Help needed moving to and from a bed to a chair (including a wheelchair)?: A  Little Help needed standing up from a chair using your arms (e.g., wheelchair or bedside chair)?: A Little Help needed to walk in hospital room?: A Little Help needed climbing 3-5 steps with a railing? : A Little 6 Click Score: 18    End of Session Equipment Utilized During Treatment: Gait belt;Cervical collar Activity Tolerance: Patient tolerated treatment well Patient left: in bed;with call bell/phone within reach;with bed alarm set;with SCD's reapplied Nurse Communication: Mobility status;Other (comment);Precautions PT Visit Diagnosis: Other abnormalities of gait and mobility (R26.89);Muscle weakness (generalized) (M62.81);History of falling (Z91.81);Pain     Time: 4098-1191 PT Time Calculation (min) (ACUTE ONLY): 20 min  Charges:    $Gait Training: 8-22 mins PT General Charges $$ ACUTE PT VISIT: 1 Visit                     Jetta Lout PTA 01/12/24, 11:53 AM

## 2024-01-12 NOTE — Progress Notes (Signed)
 Occupational Therapy Treatment Patient Details Name: Ryan [name] MRN: [id number] DOB: 1945/10/16 Today's Date: 01/12/2024   History of present illness Pt is a 78 y.o. male s/p 01/10/24 C4-5 decompression and C4-6 PSF d/t cervical myelopathy.  PMH includes sleep apnea, COPD, htn, CAD, CHF, a-fib, CVA, anemia, CABG x4, vision loss L eye, ACDF, lumbar sx, R TKA, TURP.   OT comments  Pt received supine in bed, 5/10 pain reported. Endorses slight improvement in hand unsteadiness, but still reports feeling slightly numb/shaky in both hands. Cervical collar donned while sitting EOB. Was able to manipulate utensils and feed himself breakfast this AM. Pt performs bed mobility with log roll technique with supervision, t/f sink using RW for oral care/grooming tasks. CGA for standing balance while adhering to cervical precautions. Pt making progress towards goals, OT will continue to progress as able.       If plan is discharge home, recommend the following:  A little help with walking and/or transfers;A lot of help with bathing/dressing/bathroom;Assistance with cooking/housework;Assist for transportation   Equipment Recommendations  None recommended by OT       Precautions / Restrictions Precautions Precautions: Fall;Cervical Recall of Precautions/Restrictions: Intact Precaution/Restrictions Comments: Hard c-collar when OOB and ambulating; Ok to remove when in bed; Fragile skin B UE's Required Braces or Orthoses: Cervical Brace Cervical Brace: Hard collar Restrictions Weight Bearing Restrictions Per Provider Order: No       Mobility Bed Mobility Overal bed mobility: Needs Assistance Bed Mobility: Rolling, Sidelying to Sit, Sit to Sidelying Rolling: Supervision Sidelying to sit: Supervision, HOB elevated     Sit to sidelying: Supervision, Used rails General bed mobility comments: log roll technique with good adherence    Transfers Overall transfer level: Needs assistance Equipment  used: Rolling walker (2 wheels) Transfers: Sit to/from Stand Sit to Stand: Contact guard assist                 Balance Overall balance assessment: Needs assistance Sitting-balance support: No upper extremity supported, Feet supported Sitting balance-Leahy Scale: Good     Standing balance support: Bilateral upper extremity supported, During functional activity, Reliant on assistive device for balance Standing balance-Leahy Scale: Good Standing balance comment: steady ambulating with RW use                           ADL either performed or assessed with clinical judgement   ADL Overall ADL's : Needs assistance/impaired     Grooming: Wash/dry hands;Wash/dry face;Brushing hair;Oral care;Standing Grooming Details (indicate cue type and reason): CGA, OT educates on oral care performance while maintaining cervical precautions. Pt able to open toothpaste cap and take toothbrush out of plastic casing with mild shakiness.                             Functional mobility during ADLs: Contact guard assist;Rolling walker (2 wheels) General ADL Comments: Pt places RW behind him at sink, OT educates on changing placement to face front    Extremity/Trunk Assessment Upper Extremity Assessment RUE Deficits / Details: able to manipulate toothpaste and cap with mild shakiness, pt reports numbness/tingling improving                     Communication Communication Communication: No apparent difficulties   Cognition Arousal: Alert Behavior During Therapy: WFL for tasks assessed/performed Cognition: No apparent impairments  Following commands: Intact        Cueing   Cueing Techniques: Verbal cues             Pertinent Vitals/ Pain       Pain Assessment Pain Assessment: 0-10 Pain Score: 5  Pain Location: neck Pain Descriptors / Indicators: Discomfort, Aching Pain Intervention(s): Limited activity within  patient's tolerance, Monitored during session, Premedicated before session, Repositioned   Frequency  Min 2X/week        Progress Toward Goals  OT Goals(current goals can now be found in the care plan section)  Progress towards OT goals: Progressing toward goals  Acute Rehab OT Goals OT Goal Formulation: With patient Time For Goal Achievement: 01/25/24 Potential to Achieve Goals: Good ADL Goals Pt Will Perform Grooming: sitting;standing;with modified independence Pt Will Perform Upper Body Dressing: with modified independence;sitting Pt Will Perform Lower Body Dressing: with adaptive equipment;with mod assist;sit to/from stand;sitting/lateral leans Pt Will Transfer to Toilet: with modified independence;ambulating;regular height toilet;grab bars Pt Will Perform Toileting - Clothing Manipulation and hygiene: sitting/lateral leans;sit to/from stand  Plan         AM-PAC OT "6 Clicks" Daily Activity     Outcome Measure   Help from another person eating meals?: A Little Help from another person taking care of personal grooming?: A Little Help from another person toileting, which includes using toliet, bedpan, or urinal?: A Little Help from another person bathing (including washing, rinsing, drying)?: A Little Help from another person to put on and taking off regular upper body clothing?: A Little Help from another person to put on and taking off regular lower body clothing?: A Little 6 Click Score: 18    End of Session Equipment Utilized During Treatment: Gait belt;Rolling walker (2 wheels);Cervical collar  OT Visit Diagnosis: Other abnormalities of gait and mobility (R26.89);Muscle weakness (generalized) (M62.81)   Activity Tolerance Patient tolerated treatment well   Patient Left in bed;with call bell/phone within reach;with bed alarm set   Nurse Communication Mobility status        Time: 4098-1191 OT Time Calculation (min): 15 min  Charges: OT General Charges $OT  Visit: 1 Visit OT Treatments $Self Care/Home Management : 8-22 mins  Briley Bumgarner L. Breena Bevacqua, OTR/L  01/12/24, 10:35 AM

## 2024-01-12 NOTE — Progress Notes (Signed)
   Neurosurgery Progress Note  History: Ryan [name] is s/p C4-5 decompression and C4-6 PSF  POD2: Pt doing well this morning with expected neck pain  POD1: Doing ok this morning. Tired from working with therapy. Overall feels his pain is relatively controlled  Physical Exam: Vitals:   01/11/24 1948 01/12/24 0512  BP: 139/80 (!) 142/82  Pulse: 63 66  Resp: 16   Temp: 98.4 F (36.9 C)   SpO2: 98% 99%    AA Ox3 CNI   Strength: Side Biceps Triceps Deltoid Interossei Grip Wrist Ext. Wrist Flex.  R 5 5 5 5  4+ 5 5  L 5 5 5 5  4+ 5 5    Side Iliopsoas Quads Hamstring PF DF EHL  R 5 5 5 5 5 5   L 5 5 5 5 5 5    HV 50 yesterday. No overnight output recorded Incision c/d/I with staples in place  Data:  Other tests/results: NA  Assessment/Plan:  Ryan [name] is a 78 y.o presenting with cervical pseudoarthrosis and stenosis with myelopathy s/p cervical decompression and fusion.  - mobilize - pain control - DVT prophylaxis - Hv removed this morning - PTOT; cervical collar ordered. To be worn when OOB and ambulating. OK to work with therapy prior to delivery. Will ask to switch the [place] collar out for a [place] J. - wound care consulted for skin tears. We appreciate your assistance  Manning Charity PA-C Department of Neurosurgery

## 2024-01-12 NOTE — Progress Notes (Signed)
 Physical Therapy Treatment Patient Details Name: Ryan [name] MRN: [id number] DOB: 03/03/1946 Today's Date: 01/12/2024   History of Present Illness Pt is a 78 y.o. male s/p 01/10/24 C4-5 decompression and C4-6 PSF d/t cervical myelopathy.  PMH includes sleep apnea, COPD, htn, CAD, CHF, a-fib, CVA, anemia, CABG x4, vision loss L eye, ACDF, lumbar sx, R TKA, TURP.    PT Comments  Pt received upright in bed agreeable to PT. Log rolling at supervision, indep dons C-collar and stands to RW at supervision completing > 300' of gait. Declines need to trial steps again. Reports feeling comfortable with d/c plan still. Sits EOB, doffs c-collar and returns to supine with all needs in reach.     If plan is discharge home, recommend the following: A little help with walking and/or transfers;A little help with bathing/dressing/bathroom;Assistance with cooking/housework;Assist for transportation;Help with stairs or ramp for entrance   Can travel by private vehicle        Equipment Recommendations  Rolling walker (2 wheels)    Recommendations for Other Services       Precautions / Restrictions Precautions Precautions: Fall;Cervical Recall of Precautions/Restrictions: Intact Precaution/Restrictions Comments: Hard c-collar when OOB and ambulating; Ok to remove when in bed; Fragile skin B UE's Cervical Brace: Hard collar Restrictions Weight Bearing Restrictions Per Provider Order: No     Mobility  Bed Mobility Overal bed mobility: Needs Assistance Bed Mobility: Supine to Sit, Sit to Supine     Supine to sit: Independent Sit to supine: Independent   General bed mobility comments: log roll technique with good adherence Patient Response: Cooperative  Transfers Overall transfer level: Needs assistance Equipment used: Rolling walker (2 wheels) Transfers: Sit to/from Stand Sit to Stand: Supervision                Ambulation/Gait Ambulation/Gait assistance: Supervision Gait Distance  (Feet): 360 Feet Assistive device: Rolling walker (2 wheels) Gait Pattern/deviations: Step-through pattern       General Gait Details: completes 2 laps around NSG unit safely   Stairs             Wheelchair Mobility     Tilt Bed Tilt Bed Patient Response: Cooperative  Modified Rankin (Stroke Patients Only)       Balance Overall balance assessment: Needs assistance Sitting-balance support: No upper extremity supported, Feet supported Sitting balance-Leahy Scale: Good     Standing balance support: Bilateral upper extremity supported, During functional activity, Reliant on assistive device for balance Standing balance-Leahy Scale: Good Standing balance comment: steady ambulating with RW use                            Communication Communication Communication: No apparent difficulties  Cognition Arousal: Alert Behavior During Therapy: WFL for tasks assessed/performed   PT - Cognitive impairments: No apparent impairments                         Following commands: Intact      Cueing Cueing Techniques: Verbal cues  Exercises      General Comments        Pertinent Vitals/Pain Pain Assessment Pain Assessment: 0-10 Pain Score: 5  Pain Descriptors / Indicators: Discomfort, Aching Pain Intervention(s): Limited activity within patient's tolerance, Monitored during session    Home Living  Prior Function            PT Goals (current goals can now be found in the care plan section) Acute Rehab PT Goals Patient Stated Goal: go home tomorrow PT Goal Formulation: With patient Time For Goal Achievement: 01/25/24 Potential to Achieve Goals: Good Progress towards PT goals: Progressing toward goals    Frequency    BID      PT Plan      Co-evaluation              AM-PAC PT "6 Clicks" Mobility   Outcome Measure  Help needed turning from your back to your side while in a flat bed  without using bedrails?: A Little Help needed moving from lying on your back to sitting on the side of a flat bed without using bedrails?: A Little Help needed moving to and from a bed to a chair (including a wheelchair)?: A Little Help needed standing up from a chair using your arms (e.g., wheelchair or bedside chair)?: A Little Help needed to walk in hospital room?: A Little Help needed climbing 3-5 steps with a railing? : A Little 6 Click Score: 18    End of Session Equipment Utilized During Treatment: Gait belt;Cervical collar Activity Tolerance: Patient tolerated treatment well Patient left: in bed;with call bell/phone within reach;with bed alarm set Nurse Communication: Mobility status PT Visit Diagnosis: Other abnormalities of gait and mobility (R26.89);Muscle weakness (generalized) (M62.81);History of falling (Z91.81);Pain     Time: 0981-1914 PT Time Calculation (min) (ACUTE ONLY): 12 min  Charges:    $Therapeutic Activity: 8-22 mins PT General Charges $$ ACUTE PT VISIT: 1 Visit                     Delphia Grates. Fairly IV, PT, DPT Physical Therapist- [facility]  01/12/2024, 4:02 PM

## 2024-01-12 NOTE — Discharge Summary (Incomplete)
 Discharge Summary  Patient ID: Ryan [name] MRN: [id number] DOB/AGE: 1946/03/15 78 y.o.  Admit date: 01/10/2024 Discharge date: 01/12/2024  Admission Diagnoses:  G95.9 Cervical myelopathy, M48.02 Cervical spinal stenosis, G95.89 Myelomalacia   Discharge Diagnoses:  Principal Problem:   S/P cervical spinal fusion Active Problems:   Myelopathy (HCC)   Spinal stenosis in cervical region   Myelomalacia [facility])   Discharged Condition: good  Hospital Course: ***  Consults: None  Significant Diagnostic Studies: see results reviewed  Treatments: surgery: as above. Please see separately dictated operative report for further details  Discharge Exam: Blood pressure (!) 145/67, pulse (!) 48, temperature 98 F (36.7 C), resp. rate 16, SpO2 97%. {physical [id number]}  Disposition:   Discharge Instructions     Face-to-face encounter (required for Medicare/Medicaid patients)   Complete by: As directed    I Susanne Borders certify that this patient is under my care and that I, or a nurse practitioner or physician's assistant working with me, had a face-to-face encounter that meets the physician face-to-face encounter requirements with this patient on 01/12/2024. The encounter with the patient was in whole, or in part for the following medical condition(s) which is the primary reason for home health care (List medical condition): cervical myelopathy   The encounter with the patient was in whole, or in part, for the following medical condition, which is the primary reason for home health care: cervical meylopathy   I certify that, based on my findings, the following services are medically necessary home health services:  Physical therapy Nursing     Reason for Medically Necessary Home Health Services:  Skilled Nursing- Assessment and Observation of Wound Status Therapy- Gait Training, Transfer Training and Stair Training     My clinical findings support the need for the above services:  Unable to leave home safely without assistance and/or assistive device   Further, I certify that my clinical findings support that this patient is homebound due to: Pain interferes with ambulation/mobility   Home Health   Complete by: As directed    To provide the following care/treatments:  PT OT RN     Incentive spirometry RT   Complete by: As directed       Allergies as of 01/12/2024       Reactions   Albumin (human) Anaphylaxis   Guaifenesin Anaphylaxis, Other (See Comments)   Stomach cramps   Polymyxin B-trimethoprim Swelling   Eye drops made eyes swell   Pseudoephedrine Other (See Comments), Hives   Stomach cramps   Codeine Hives, Itching, Rash, Other (See Comments)   Guaiacol Other (See Comments)   Hallucinations   Polymyxin B Other (See Comments), Rash, Swelling   Eye swelling   Statins Other (See Comments)   Muscle cramps Muscle aches, Muscle cramps   Atorvastatin Other (See Comments)   Muscle aches and cramps Other reaction(s): Other (See Comments) Muscle Aches   Brimonidine Tartrate Itching, Swelling   Gabapentin Other (See Comments)   Pt unsure of sensitivity unk   Meloxicam Other (See Comments)   Pt unsure of sensitivity Unknown reaction   Peppermint Flavoring Agent (non-screening) Other (See Comments)   Severe cramping   Pseudoephedrine-guaifenesin Nausea And Vomiting   Stomach cramps   Pseudoephedrine-guaifenesin Other (See Comments)   Stomach cramps   Rosuvastatin Calcium Other (See Comments)   Muscle aches   Rosuvastatin Calcium Other (See Comments)   Muscle aches   Simvastatin Other (See Comments)   Muscle aches and cramps Other reaction(s): Other (See Comments)  Tapentadol Other (See Comments)   Pt unsure of sensitivity Unknown reaction   Ciprofloxacin Hives, Itching, Nausea Only, Rash, Other (See Comments)   Moxifloxacin Nausea Only, Other (See Comments)   Headaches, stomach cramps Headaches, Stomach cramps   Rofecoxib Other (See  Comments)   Stomach cramping        Medication List     PAUSE taking these medications    Eliquis 5 MG Tabs tablet Wait to take this until your doctor or other care provider tells you to start again. Generic drug: apixaban TAKE 1 TABLET BY MOUTH TWICE  DAILY       TAKE these medications    acetaminophen 500 MG tablet Commonly known as: TYLENOL Take 1,000 mg by mouth at bedtime.   aspirin EC 81 MG tablet Take 1 tablet (81 mg total) by mouth daily. Swallow whole.   budesonide 3 MG 24 hr capsule Commonly known as: ENTOCORT EC TAKE 2 CAPSULES(6 MG) BY MOUTH DAILY   cholecalciferol 25 MCG (1000 UNIT) tablet Commonly known as: VITAMIN D3 Take 10,000 Units by mouth 2 (two) times a week. Monday and Friday   diazepam 5 MG tablet Commonly known as: VALIUM TAKE 1/2 TO 2 TABLETS(2.5 TO 10 MG) BY MOUTH EVERY 12 HOURS AS NEEDED FOR ANXIETY   furosemide 20 MG tablet Commonly known as: LASIX TAKE 1 TABLET BY MOUTH DAILY AS  NEEDED   latanoprost 0.005 % ophthalmic solution Commonly known as: XALATAN Place 1 drop into the left eye at bedtime.   levalbuterol 45 MCG/ACT inhaler Commonly known as: XOPENEX HFA Inhale 2 puffs into the lungs every 4 (four) hours as needed for wheezing or shortness of breath.   lidocaine 5 % Commonly known as: Lidoderm Place 1 patch onto the skin daily. Remove & Discard patch within 12 hours or as directed by MD   Lomotil 2.5-0.025 MG tablet Generic drug: diphenoxylate-atropine Take 1 tablet by mouth 4 (four) times daily as needed for diarrhea or loose stools.   LUCENTIS IO Inject 1 Dose into the eye every 6 (six) weeks. Bilateral eye by Dr Marvis Repress   magnesium oxide 400 (240 Mg) MG tablet Commonly known as: MAG-OX TAKE 1 TABLET(400 MG) BY MOUTH THREE TIMES DAILY   metoprolol tartrate 100 MG tablet Commonly known as: LOPRESSOR TAKE 1 TABLET BY MOUTH TWICE  DAILY   nitroGLYCERIN 0.4 MG SL tablet Commonly known as: NITROSTAT Place 1  tablet (0.4 mg total) under the tongue every 5 (five) minutes as needed for chest pain.   omeprazole 20 MG tablet Commonly known as: PRILOSEC OTC Take 20 mg by mouth every morning.   oxyCODONE 5 MG immediate release tablet Commonly known as: Oxy IR/ROXICODONE Take 1 tablet (5 mg total) by mouth every 4 (four) hours as needed for moderate pain (pain score 4-6).   PreserVision AREDS 2 Caps Take 1 capsule by mouth daily.   Repatha SureClick 140 MG/ML Soaj Generic drug: Evolocumab Inject 140 mg into the skin every 14 (fourteen) days.   tamsulosin 0.4 MG Caps capsule Commonly known as: FLOMAX Take 0.4 mg by mouth 2 (two) times daily.   tiZANidine 4 MG tablet Commonly known as: ZANAFLEX Take 1 tablet (4 mg total) by mouth every 6 (six) hours as needed for muscle spasms.        Follow-up Information     Susanne Borders, PA Follow up on 01/25/2024.   Specialty: Neurosurgery Contact information: [address] [place] [address] [phone]  Signed: Susanne Borders 01/12/2024, 10:14 AM

## 2024-01-13 ENCOUNTER — Other Ambulatory Visit: Payer: Self-pay

## 2024-01-13 NOTE — Progress Notes (Signed)
 Physical Therapy Treatment Patient Details Name: Ryan [name] MRN: [id number] DOB: [birth year] Today's Date: 01/13/2024   History of Present Illness Pt is a 78 y.o. male s/p 01/10/24 C4-5 decompression and C4-6 PSF d/t cervical myelopathy.  PMH includes sleep apnea, COPD, htn, CAD, CHF, a-fib, CVA, anemia, CABG x4, vision loss L eye, ACDF, lumbar sx, R TKA, TURP.    PT Comments  Pt was sitting in recliner upon arrival. He endorses 7/10 pain but remains cooperative and agreeable for OOB activity. He demonstrated safe abilities to stand, ambulate, and perform stairs to simulate home entry. Acute PT will continue to follow and progress per current POC however pt is cleared from a PT standpoint for safe DC home.    If plan is discharge home, recommend the following: A little help with walking and/or transfers;A little help with bathing/dressing/bathroom;Assistance with cooking/housework;Assist for transportation;Help with stairs or ramp for entrance     Equipment Recommendations  None recommended by PT (pt endorses having all his equipment at home already)       Precautions / Restrictions Precautions Precautions: Fall;Cervical Precaution Booklet Issued: Yes (comment) Recall of Precautions/Restrictions: Intact Precaution/Restrictions Comments: Hard c-collar when OOB and ambulating; Ok to remove when in bed; Fragile skin B UE's Required Braces or Orthoses: Cervical Brace Cervical Brace: Hard collar Restrictions Weight Bearing Restrictions Per Provider Order: No     Mobility  Bed Mobility  General bed mobility comments: in recliner pre/post session    Transfers Overall transfer level: Modified independent Equipment used: Rolling walker (2 wheels)  General transfer comment: pt was able to apply cervical collar I'ly    Ambulation/Gait Ambulation/Gait assistance: Supervision Gait Distance (Feet): 200 Feet Assistive device: Rolling walker (2 wheels) Gait Pattern/deviations: Step-through  pattern, Narrow base of support Gait velocity: decreased  General Gait Details: no LOB or safety concerns with ambulation with use of RW   Stairs Stairs: Yes Stairs assistance: Supervision Stair Management: Sideways, One rail Right, Step to pattern Number of Stairs: 4 General stair comments: Pt was able to ascend/descend 4 stair 2 x without difficulty or safety concern    Balance Overall balance assessment: Needs assistance Sitting-balance support: No upper extremity supported, Feet supported Sitting balance-Leahy Scale: Good     Standing balance support: Bilateral upper extremity supported, During functional activity, Reliant on assistive device for balance Standing balance-Leahy Scale: Good       Communication Communication Communication: No apparent difficulties  Cognition Arousal: Alert Behavior During Therapy: WFL for tasks assessed/performed   PT - Cognitive impairments: No apparent impairments    PT - Cognition Comments: Pt is A and O x 4 Following commands: Intact      Cueing Cueing Techniques: Verbal cues         Pertinent Vitals/Pain Pain Assessment Pain Assessment: 0-10 Pain Score: 7      PT Goals (current goals can now be found in the care plan section) Acute Rehab PT Goals Patient Stated Goal: go home tomorrow Progress towards PT goals: Progressing toward goals    Frequency    BID       AM-PAC PT "6 Clicks" Mobility   Outcome Measure  Help needed turning from your back to your side while in a flat bed without using bedrails?: A Little Help needed moving from lying on your back to sitting on the side of a flat bed without using bedrails?: A Little Help needed moving to and from a bed to a chair (including a wheelchair)?: A Little Help needed  standing up from a chair using your arms (e.g., wheelchair or bedside chair)?: A Little Help needed to walk in hospital room?: A Little Help needed climbing 3-5 steps with a railing? : A Little 6  Click Score: 18    End of Session Equipment Utilized During Treatment: Cervical collar Activity Tolerance: Patient tolerated treatment well Patient left: in chair;with call bell/phone within reach;with nursing/sitter in room Nurse Communication: Mobility status PT Visit Diagnosis: Other abnormalities of gait and mobility (R26.89);Muscle weakness (generalized) (M62.81);History of falling (Z91.81);Pain     Time: 1914-7829 PT Time Calculation (min) (ACUTE ONLY): 18 min  Charges:    $Gait Training: 8-22 mins PT General Charges $$ ACUTE PT VISIT: 1 Visit                    Jetta Lout PTA 01/13/24, 10:01 AM

## 2024-01-13 NOTE — Plan of Care (Signed)

## 2024-01-13 NOTE — Care Management Important Message (Signed)
 Important Message  Patient Details  Name: Ryan [name] MRN: [id number] Date of Birth: [birth year]   Important Message Given:  Yes - Medicare IM     Cristela Blue, CMA 01/13/2024, 10:03 AM

## 2024-01-13 NOTE — TOC Transition Note (Signed)
 Transition of Care [facility]) - Discharge Note   Patient Details  Name: Ryan [name] MRN: [id number] Date of Birth: 01-Jun-1946  Transition of Care [facility]) CM/SW Contact:  Marlowe Sax, RN Phone Number: 01/13/2024, 9:49 AM   Clinical Narrative:     Patient going home with Home health with [facility], Has DME at home and does not need additional  Final next level of care: Home w Home Health Services Barriers to Discharge: No Barriers Identified   Patient Goals and CMS Choice            Discharge Placement                       Discharge Plan and Services Additional resources added to the After Visit Summary for     Discharge Planning Services: CM Consult            DME Arranged: N/A DME Agency: NA       HH Arranged: PT, OT HH Agency: [facility] Date HH Agency Contacted: 01/11/24 Time HH Agency Contacted: 1629 Representative spoke with at [facility] Agency: Renee Harder  Social Drivers of Health (SDOH) Interventions SDOH Screenings   Food Insecurity: No Food Insecurity (10/09/2023)  Housing: Low Risk  (01/10/2024)  Transportation Needs: No Transportation Needs (01/10/2024)  Utilities: Not At Risk (01/10/2024)  Alcohol Screen: Low Risk  (10/09/2023)  Depression (PHQ2-9): Low Risk  (03/23/2023)  Financial Resource Strain: Low Risk  (10/09/2023)  Physical Activity: Insufficiently Active (10/09/2023)  Social Connections: Moderately Isolated (01/10/2024)  Stress: No Stress Concern Present (10/09/2023)  Tobacco Use: Medium Risk (01/10/2024)     Readmission Risk Interventions    01/12/2022    8:35 AM 08/01/2021   10:18 AM  Readmission Risk Prevention Plan  Transportation Screening Complete Complete  PCP or Specialist Appt within 3-5 Days Complete Complete  HRI or Home Care Consult Complete Complete  Social Work Consult for Recovery Care Planning/Counseling  Complete  Palliative Care Screening Not Applicable Not Applicable  Medication Review Oceanographer)   Complete

## 2024-01-13 NOTE — Progress Notes (Signed)
   Neurosurgery Progress Note  History: OSHAE SIMMERING is s/p C4-5 decompression and C4-6 PSF  POD3: Doing well this morning and eager to go home. Medications have been delivered to his bedside  POD2: Pt doing well this morning with expected neck pain  POD1: Doing ok this morning. Tired from working with therapy. Overall feels his pain is relatively controlled  Physical Exam: Vitals:   01/13/24 0435 01/13/24 0835  BP: 138/64 118/65  Pulse: 66 79  Resp:  18  Temp:  100 F (37.8 C)  SpO2: 97% 94%    AA Ox3 CNI   Strength: Side Biceps Triceps Deltoid Interossei Grip Wrist Ext. Wrist Flex.  R 5 5 5 5  4+ 5 5  L 5 5 5 5  4+ 5 5    Side Iliopsoas Quads Hamstring PF DF EHL  R 5 5 5 5 5 5   L 5 5 5 5 5 5    HV 50 yesterday. No overnight output recorded Incision c/d/I with staples in place  Data:  Other tests/results: NA  Assessment/Plan:  GAETAN SPIEKER is a 78 y.o presenting with cervical pseudoarthrosis and stenosis with myelopathy s/p cervical decompression and fusion.  - mobilize - pain control - DVT prophylaxis - Hv removed 4/9 - PTOT; Pt going home with [facility] - wound care consulted for skin tears. We appreciate your assistance  Manning Charity PA-C Department of Neurosurgery

## 2024-01-14 ENCOUNTER — Telehealth: Payer: Self-pay

## 2024-01-14 NOTE — Transitions of Care (Post Inpatient/ED Visit) (Signed)
 01/14/2024  Name: Ryan [name] MRN: [id number] DOB: 1946-01-11  Today's TOC FU Call Status: Today's TOC FU Call Status:: Successful TOC FU Call Completed TOC FU Call Complete Date: 01/14/24 Patient's Name and Date of Birth confirmed.  Transition Care Management Follow-up Telephone Call Date of Discharge: 01/13/24 Discharge Facility: [facility]) Type of Discharge: Inpatient Admission How have you been since you were released from the hospital?: Better (continues to have pain 6/10. Taking pain medication. Slept well) Any questions or concerns?: No  Items Reviewed: Did you receive and understand the discharge instructions provided?: Yes Medications obtained,verified, and reconciled?: Yes (Medications Reviewed) Any new allergies since your discharge?: No Dietary orders reviewed?: Yes Type of Diet Ordered:: low salt diet Do you have support at home?: Yes People in Home [RPT]: spouse  Medications Reviewed Today: Medications Reviewed Today     Reviewed by Earlie Server, RN (Registered Nurse) on 01/14/24 at 1153  Med List Status: <None>   Medication Order Taking? Sig Documenting Provider Last Dose Status Informant  acetaminophen (TYLENOL) 500 MG tablet [id number] Yes Take 1,000 mg by mouth at bedtime. [provider] Taking Active Self  aspirin EC 81 MG EC tablet [id number] Yes Take 1 tablet (81 mg total) by mouth daily. Swallow whole. Leary Roca, PA-C Taking Active Self           Med Note Graylin Shiver   Tue Jan 27, 2022  1:36 PM)    budesonide (ENTOCORT EC) 3 MG 24 hr capsule [id number] Yes TAKE 2 CAPSULES(6 MG) BY MOUTH DAILY Cirigliano, Vito V, DO Taking Active Self  cholecalciferol (VITAMIN D3) 25 MCG (1000 UNIT) tablet [id number] Yes Take 10,000 Units by mouth 2 (two) times a week. Monday and Friday [provider] Taking Active Self  diazepam (VALIUM) 5 MG tablet [id number] Yes TAKE 1/2 TO 2 TABLETS(2.5 TO 10 MG) BY  MOUTH EVERY 12 HOURS AS NEEDED FOR ANXIETY [name], MD Taking Active Self  diphenoxylate-atropine (LOMOTIL) 2.5-0.025 MG tablet [id number] Yes Take 1 tablet by mouth 4 (four) times daily as needed for diarrhea or loose stools. [provider] Taking Active   ELIQUIS 5 MG TABS tablet [id number] No TAKE 1 TABLET BY MOUTH TWICE  DAILY  Patient not taking: Reported on 01/14/2024   Marinus Maw, MD Not Taking Active Self  Evolocumab [facility] SURECLICK) 140 MG/ML Ivory Broad [id number] Yes Inject 140 mg into the skin every 14 (fourteen) days. Marinus Maw, MD Taking Active Self  furosemide (LASIX) 20 MG tablet [id number] Yes TAKE 1 TABLET BY MOUTH DAILY AS  NEEDED [name], MD Taking Active Self  latanoprost (XALATAN) 0.005 % ophthalmic solution [id number]  Place 1 drop into the left eye at bedtime.  [provider]  Active Self  levalbuterol [facility] HFA) 45 MCG/ACT inhaler [id number] Yes Inhale 2 puffs into the lungs every 4 (four) hours as needed for wheezing or shortness of breath. [name], MD Taking Active Self  lidocaine (LIDODERM) 5 % [id number] Yes Place 1 patch onto the skin daily. Remove & Discard patch within 12 hours or as directed by MD Netta Corrigan, PA-C Taking Active   magnesium oxide (MAG-OX) 400 (240 Mg) MG tablet [id number] Yes TAKE 1 TABLET(400 MG) BY MOUTH THREE TIMES DAILY [name], MD Taking Active Self  metoprolol tartrate (LOPRESSOR) 100 MG tablet [id number] Yes TAKE 1 TABLET BY MOUTH TWICE  DAILY Marinus Maw, MD Taking Active Self  Multiple  Vitamins-Minerals (PRESERVISION AREDS 2) CAPS [id number] Yes Take 1 capsule by mouth daily. [provider] Taking Active Self  nitroGLYCERIN (NITROSTAT) 0.4 MG SL tablet [id number] Yes Place 1 tablet (0.4 mg total) under the tongue every 5 (five) minutes as needed for chest pain. Drema Dallas, MD Taking Active Self  omeprazole (PRILOSEC OTC) 20 MG tablet [id number] Yes Take 20 mg by mouth every  morning. [provider] Taking Active Self  oxyCODONE (OXY IR/ROXICODONE) 5 MG immediate release tablet [id number] Yes Take 1 tablet (5 mg total) by mouth every 4 (four) hours as needed for moderate pain (pain score 4-6). Susanne Borders, PA Taking Active   Ranibizumab (LUCENTIS IO) [id number] Yes Inject 1 Dose into the eye every 6 (six) weeks. Bilateral eye by Dr Marvis Repress [provider] Taking Active Self           Med Note Roxan Hockey, [place] R   Tue Aug 12, 2021 10:15 AM)    tamsulosin (FLOMAX) 0.4 MG CAPS capsule [id number] Yes Take 0.4 mg by mouth 2 (two) times daily. [provider] Taking Active Self  tiZANidine (ZANAFLEX) 4 MG tablet [id number] No Take 1 tablet (4 mg total) by mouth every 6 (six) hours as needed for muscle spasms.  Patient not taking: Reported on 01/14/2024   Susanne Borders, PA Not Taking Active             Home Care and Equipment/Supplies: Were Home Health Services Ordered?: Yes Name of Home Health Agency:: [facility] Has Agency set up a time to come to your home?: No Any new equipment or medical supplies ordered?: NA  Functional Questionnaire: Do you need assistance with bathing/showering or dressing?: No Do you need assistance with meal preparation?: Yes (wife) Do you need assistance with eating?: No Do you have difficulty maintaining continence: No Do you need assistance with getting out of bed/getting out of a chair/moving?: Yes (family assist  and uses a walker) Do you have difficulty managing or taking your medications?: No  Follow up appointments reviewed: PCP Follow-up appointment confirmed?: NA Specialist Hospital Follow-up appointment confirmed?: Yes Date of Specialist follow-up appointment?: 01/25/24 Follow-Up Specialty Provider:: Manning Charity Do you need transportation to your follow-up appointment?: No Do you understand care options if your condition(s) worsen?: Yes-patient verbalized understanding  SDOH  Interventions Today    Flowsheet Row Most Recent Value  SDOH Interventions   Food Insecurity Interventions Intervention Not Indicated  Housing Interventions Intervention Not Indicated  Transportation Interventions Intervention Not Indicated  Utilities Interventions Intervention Not Indicated      Placed call to patient to review Transition of care discharge orders. Patient reports that he does not know why I am calling. I explained to patient.  Patient not interested in going through all his medications. States that he is knowledgeable about his medications.  Patient did agree for me to call Home Health with [facility] for follow up. Spoke with Herbert Seta at [employer] who states they have orders and have tried to call patient already.  Patient denies any other concerns. Reviewed and offered 30 day TOC program and patient declined. Reviewed importance of follow up with PCP and patient declined.  Provided my contact number for patient to call me if needed.  Lonia Chimera, RN, BSN, CEN [employer]- Transition of Care Team.  [facility] [phone]

## 2024-01-17 DIAGNOSIS — M180 Bilateral primary osteoarthritis of first carpometacarpal joints: Secondary | ICD-10-CM | POA: Diagnosis not present

## 2024-01-17 DIAGNOSIS — I480 Paroxysmal atrial fibrillation: Secondary | ICD-10-CM | POA: Diagnosis not present

## 2024-01-17 DIAGNOSIS — H3530 Unspecified macular degeneration: Secondary | ICD-10-CM | POA: Diagnosis not present

## 2024-01-17 DIAGNOSIS — I5032 Chronic diastolic (congestive) heart failure: Secondary | ICD-10-CM | POA: Diagnosis not present

## 2024-01-17 DIAGNOSIS — G9589 Other specified diseases of spinal cord: Secondary | ICD-10-CM | POA: Diagnosis not present

## 2024-01-17 DIAGNOSIS — Z981 Arthrodesis status: Secondary | ICD-10-CM | POA: Diagnosis not present

## 2024-01-17 DIAGNOSIS — Z7951 Long term (current) use of inhaled steroids: Secondary | ICD-10-CM | POA: Diagnosis not present

## 2024-01-17 DIAGNOSIS — F419 Anxiety disorder, unspecified: Secondary | ICD-10-CM | POA: Diagnosis not present

## 2024-01-17 DIAGNOSIS — Z4789 Encounter for other orthopedic aftercare: Secondary | ICD-10-CM | POA: Diagnosis not present

## 2024-01-17 DIAGNOSIS — Z8546 Personal history of malignant neoplasm of prostate: Secondary | ICD-10-CM | POA: Diagnosis not present

## 2024-01-17 DIAGNOSIS — I700 Atherosclerosis of aorta: Secondary | ICD-10-CM | POA: Diagnosis not present

## 2024-01-17 DIAGNOSIS — N401 Enlarged prostate with lower urinary tract symptoms: Secondary | ICD-10-CM | POA: Diagnosis not present

## 2024-01-17 DIAGNOSIS — N183 Chronic kidney disease, stage 3 unspecified: Secondary | ICD-10-CM | POA: Diagnosis not present

## 2024-01-17 DIAGNOSIS — I2510 Atherosclerotic heart disease of native coronary artery without angina pectoris: Secondary | ICD-10-CM | POA: Diagnosis not present

## 2024-01-17 DIAGNOSIS — M4802 Spinal stenosis, cervical region: Secondary | ICD-10-CM | POA: Diagnosis not present

## 2024-01-17 DIAGNOSIS — Z85038 Personal history of other malignant neoplasm of large intestine: Secondary | ICD-10-CM | POA: Diagnosis not present

## 2024-01-17 DIAGNOSIS — G959 Disease of spinal cord, unspecified: Secondary | ICD-10-CM | POA: Diagnosis not present

## 2024-01-17 DIAGNOSIS — Z7901 Long term (current) use of anticoagulants: Secondary | ICD-10-CM | POA: Diagnosis not present

## 2024-01-17 DIAGNOSIS — J439 Emphysema, unspecified: Secondary | ICD-10-CM | POA: Diagnosis not present

## 2024-01-17 DIAGNOSIS — D649 Anemia, unspecified: Secondary | ICD-10-CM | POA: Diagnosis not present

## 2024-01-17 DIAGNOSIS — M48062 Spinal stenosis, lumbar region with neurogenic claudication: Secondary | ICD-10-CM | POA: Diagnosis not present

## 2024-01-17 DIAGNOSIS — I130 Hypertensive heart and chronic kidney disease with heart failure and stage 1 through stage 4 chronic kidney disease, or unspecified chronic kidney disease: Secondary | ICD-10-CM | POA: Diagnosis not present

## 2024-01-17 DIAGNOSIS — I4510 Unspecified right bundle-branch block: Secondary | ICD-10-CM | POA: Diagnosis not present

## 2024-01-18 DIAGNOSIS — G9589 Other specified diseases of spinal cord: Secondary | ICD-10-CM | POA: Diagnosis not present

## 2024-01-18 DIAGNOSIS — G959 Disease of spinal cord, unspecified: Secondary | ICD-10-CM | POA: Diagnosis not present

## 2024-01-18 DIAGNOSIS — M4802 Spinal stenosis, cervical region: Secondary | ICD-10-CM | POA: Diagnosis not present

## 2024-01-18 DIAGNOSIS — Z4789 Encounter for other orthopedic aftercare: Secondary | ICD-10-CM | POA: Diagnosis not present

## 2024-01-18 DIAGNOSIS — I130 Hypertensive heart and chronic kidney disease with heart failure and stage 1 through stage 4 chronic kidney disease, or unspecified chronic kidney disease: Secondary | ICD-10-CM | POA: Diagnosis not present

## 2024-01-18 DIAGNOSIS — I5032 Chronic diastolic (congestive) heart failure: Secondary | ICD-10-CM | POA: Diagnosis not present

## 2024-01-24 DIAGNOSIS — I5032 Chronic diastolic (congestive) heart failure: Secondary | ICD-10-CM | POA: Diagnosis not present

## 2024-01-24 DIAGNOSIS — M4802 Spinal stenosis, cervical region: Secondary | ICD-10-CM | POA: Diagnosis not present

## 2024-01-24 DIAGNOSIS — I130 Hypertensive heart and chronic kidney disease with heart failure and stage 1 through stage 4 chronic kidney disease, or unspecified chronic kidney disease: Secondary | ICD-10-CM | POA: Diagnosis not present

## 2024-01-24 DIAGNOSIS — G9589 Other specified diseases of spinal cord: Secondary | ICD-10-CM | POA: Diagnosis not present

## 2024-01-24 DIAGNOSIS — Z4789 Encounter for other orthopedic aftercare: Secondary | ICD-10-CM | POA: Diagnosis not present

## 2024-01-24 DIAGNOSIS — G959 Disease of spinal cord, unspecified: Secondary | ICD-10-CM | POA: Diagnosis not present

## 2024-01-25 ENCOUNTER — Encounter: Payer: Medicare Other | Admitting: Orthopedic Surgery

## 2024-01-25 ENCOUNTER — Encounter: Admitting: Neurosurgery

## 2024-01-26 ENCOUNTER — Ambulatory Visit (INDEPENDENT_AMBULATORY_CARE_PROVIDER_SITE_OTHER): Admitting: Physician Assistant

## 2024-01-26 VITALS — BP 124/74 | Temp 97.8°F | Ht 64.0 in | Wt 174.0 lb

## 2024-01-26 DIAGNOSIS — G959 Disease of spinal cord, unspecified: Secondary | ICD-10-CM

## 2024-01-26 DIAGNOSIS — Z981 Arthrodesis status: Secondary | ICD-10-CM

## 2024-01-26 NOTE — Progress Notes (Unsigned)
   REFERRING PHYSICIAN:  Neda Balk, Md [address] [place],  [place] [address]  DOS: 01/10/2024, C4-5 posterior decompression with C4-7 instrumentation.   HISTORY OF PRESENT ILLNESS: Ryan [name] is 2 weeks status post C4-5 posterior decompression with C4-7 instrumentation. Overall, he is doing okay.  He is currently only taking Tylenol  for his pain.  He did not like the way that oxycodone  or tizanidine  makes him feel he has discontinued these medications.  Physical therapy has been coming to his home twice a week, but he has also been doing home exercises.  He has some pain that extends from his neck into his right shoulder.  A lot of pain is in the back of his neck prior to surgery is now gone and he also states that his leg pain has improved.  No new weakness, numbness or tingling.    PHYSICAL EXAMINATION:  NEUROLOGICAL:  General: In no acute distress.   Awake, alert, oriented to person, place, and time.  Pupils equal round and reactive to light.     Strength: Side Biceps Triceps Deltoid Interossei Grip Wrist Ext. Wrist Flex.  R 5 5 5 5 5 5 5   L 5 5 5 5 5 5 5    Incision c/d/I.  Staples have been removed.  Minimal erythema surrounding the incision site without any drainage.  Patient still has skin tears in left upper extremity.  These are currently covered with a bandage.  Imaging:  No new imaging prior to this appointment.  Assessment / Plan: MAEJOR ERVEN is 2 weeks status post C4-5 posterior decompression with C4-7 instrumentation. Overall, he is doing okay.  He is currently only taking Tylenol  for his pain.  He did not like the way that oxycodone  or tizanidine  makes him feel he has discontinued these medications.  Physical therapy has been coming to his home twice a week, but he has also been doing home exercises.  He has some pain that extends from his neck into his right shoulder.  A lot of pain is in the back of his neck prior to surgery is now gone and he  also states that his leg pain has improved.  No new weakness, numbness or tingling.We discussed activity escalation and I have advised the patient to lift up to 10 pounds until 6 weeks after surgery, then increase up to 25 pounds until 12 weeks after surgery.  After 12 weeks post-op, the patient advised to increase activity as tolerated. he will return to clinic in approximately 1 month for his 6-week postop visit.  I did offer patient alternative medications for his pain however he declines at this time and would like to just continue with Tylenol .  Advised to contact the office if any questions or concerns arise.   Ludwig Safer PA-C Dept of Neurosurgery

## 2024-01-27 DIAGNOSIS — G9589 Other specified diseases of spinal cord: Secondary | ICD-10-CM | POA: Diagnosis not present

## 2024-01-27 DIAGNOSIS — I130 Hypertensive heart and chronic kidney disease with heart failure and stage 1 through stage 4 chronic kidney disease, or unspecified chronic kidney disease: Secondary | ICD-10-CM | POA: Diagnosis not present

## 2024-01-27 DIAGNOSIS — I5032 Chronic diastolic (congestive) heart failure: Secondary | ICD-10-CM | POA: Diagnosis not present

## 2024-01-27 DIAGNOSIS — Z4789 Encounter for other orthopedic aftercare: Secondary | ICD-10-CM | POA: Diagnosis not present

## 2024-01-27 DIAGNOSIS — G959 Disease of spinal cord, unspecified: Secondary | ICD-10-CM | POA: Diagnosis not present

## 2024-01-27 DIAGNOSIS — M4802 Spinal stenosis, cervical region: Secondary | ICD-10-CM | POA: Diagnosis not present

## 2024-01-28 ENCOUNTER — Telehealth: Payer: Self-pay | Admitting: Neurosurgery

## 2024-01-28 ENCOUNTER — Other Ambulatory Visit: Payer: Self-pay | Admitting: Neurosurgery

## 2024-01-28 MED ORDER — TRAMADOL HCL 50 MG PO TABS: 50.0000 mg | ORAL_TABLET | Freq: Four times a day (QID) | ORAL | 0 refills | Status: DC | PRN

## 2024-01-28 NOTE — Telephone Encounter (Signed)
 C4-5 laminectomy, C4-7 posterior spinal instrumentation on 01/10/24  Patient needs a refill on pain medication. He would like to have something else not oxycodone . He does not like the way it makes him feel.  [facility] [place] on [address]

## 2024-01-28 NOTE — Telephone Encounter (Signed)
 Patient aware of tramadol  rx sent to the pharmacy.

## 2024-01-28 NOTE — Telephone Encounter (Signed)
 Patient is calling back to follow up on his medication request. He states that he cannot wait until Monday for something to be sent in.

## 2024-01-28 NOTE — Telephone Encounter (Signed)
 Dr Mont Antis sent in Tramadol  instead.

## 2024-01-31 ENCOUNTER — Ambulatory Visit (INDEPENDENT_AMBULATORY_CARE_PROVIDER_SITE_OTHER): Payer: Medicare Other | Admitting: Gastroenterology

## 2024-01-31 ENCOUNTER — Encounter: Payer: Self-pay | Admitting: Gastroenterology

## 2024-01-31 VITALS — BP 132/86 | HR 76 | Ht 64.0 in | Wt 183.1 lb

## 2024-01-31 DIAGNOSIS — N183 Chronic kidney disease, stage 3 unspecified: Secondary | ICD-10-CM

## 2024-01-31 DIAGNOSIS — M4802 Spinal stenosis, cervical region: Secondary | ICD-10-CM

## 2024-01-31 DIAGNOSIS — I480 Paroxysmal atrial fibrillation: Secondary | ICD-10-CM

## 2024-01-31 DIAGNOSIS — J439 Emphysema, unspecified: Secondary | ICD-10-CM

## 2024-01-31 DIAGNOSIS — Z79899 Other long term (current) drug therapy: Secondary | ICD-10-CM

## 2024-01-31 DIAGNOSIS — Z4789 Encounter for other orthopedic aftercare: Secondary | ICD-10-CM

## 2024-01-31 DIAGNOSIS — G959 Disease of spinal cord, unspecified: Secondary | ICD-10-CM

## 2024-01-31 DIAGNOSIS — N401 Enlarged prostate with lower urinary tract symptoms: Secondary | ICD-10-CM

## 2024-01-31 DIAGNOSIS — I130 Hypertensive heart and chronic kidney disease with heart failure and stage 1 through stage 4 chronic kidney disease, or unspecified chronic kidney disease: Secondary | ICD-10-CM

## 2024-01-31 DIAGNOSIS — K219 Gastro-esophageal reflux disease without esophagitis: Secondary | ICD-10-CM

## 2024-01-31 DIAGNOSIS — D649 Anemia, unspecified: Secondary | ICD-10-CM

## 2024-01-31 DIAGNOSIS — K52832 Lymphocytic colitis: Secondary | ICD-10-CM | POA: Diagnosis not present

## 2024-01-31 DIAGNOSIS — I700 Atherosclerosis of aorta: Secondary | ICD-10-CM

## 2024-01-31 DIAGNOSIS — I5032 Chronic diastolic (congestive) heart failure: Secondary | ICD-10-CM

## 2024-01-31 MED ORDER — BUDESONIDE 3 MG PO CPEP
6.0000 mg | ORAL_CAPSULE | Freq: Every day | ORAL | 3 refills | Status: AC
Start: 2024-01-31 — End: ?

## 2024-01-31 NOTE — Progress Notes (Signed)
 Chief Complaint:   Medication refill, lymphocytic colitis  GI History: 78 y.o. male with a history of CAD s/p CABG, COPD, CHF, GERD, HTN, hyperlipidemia, nephrolithiasis, osteoporosis, atrial fibrillation, cardioversion 2019 with recurrence (on Eliquis ), C-spine surgery, prostate cancer s/p radiation/surgery/chemotherapy, cholecystectomy, initially seen in the GI clinic 12/22/2020 for evaluation of chronic diarrhea c/b hospital admissions for electrolyte abnormalities, eventually diagnosed with Lymphocytic Colitis as below.   -04/2020: Acute onset diarrhea with 6-day hospitalization for electrolyte abnormalities, fatigue -04/2020: CT A/P: Sigmoid diverticulosis without diverticulitis, gastric wall thickening -08/2020: Hospital admission for hypomagnesemia, hypokalemia, ongoing diarrhea.  Discharged with cholestyramine , Imodium , oral potassium and magnesium  supplements -08/2020: Follow-up with Dr. Andriette Keeling.  Fecal calprotectin mildly elevated at 110. Pancreatic elastase very mildly reduced at 192.  Lactoferrin normal, FOBT-, C diff-, GI PCR panel-. Started on budesonide  9 mg/day, but only tolerated x2 weeks and discontinued. -Start Lomotil  in 08/2020 with some improvement -09/2020: Follow-up with Dr. Andriette Keeling.  Was doing okay with Questran  bid, but breakthrough recurrent loose stools when he tried to titrate down to 1 packet/day.  Recommended colonoscopy, but this was never scheduled -12/2020: Hospital admission for hypomagnesemia (0.8), dizziness.  Improved with IVF and mag supplementation - 12/17/2020: Initial appointment with me.  Some improvement with daily cholestyramine  and Lomotil  QPM. - 12/2020: Negative/normal Giardia, fecal elastase, CMP.  Mag 1.5 started on mag supplement. Was sent for SIBO testing-  - 01/2021: EGD/colonoscopy as outlined below - 02/09/2022: GI follow-up.  Diarrhea essentially resolved with budesonide .  Had breakthrough symptoms shortly after tapering off, so treated with prolonged  course with consideration for adding cholestyramine  back if breakthrough at low-dose   Endoscopic History: -Colonoscopy (10/2017, Dr. Andriette Keeling): 8 mm splenic flexure polyp, 2 cm pedunculated sigmoid polyp, left-sided diverticulosis, large internal hemorrhoids.  Normal TI -EGD (08/2019, Dr. Andriette Keeling): 2 cm HH, otherwise normal -Colonoscopy (08/2019, Dr. Andriette Keeling): 5 mm ascending polyp, left-sided diverticulosis, large internal hemorrhoids.  Normal TI - EGD (01/2021, Dr. Karene Oto): Fundic gland polyps, mild gastritis.  Normal duodenal biopsies - Colonoscopy (01/2021, Dr. Karene Oto): 2 mm benign sigmoid polyp, sigmoid diverticulosis, internal hemorrhoids.  Normal mucosa but biopsies n/f Lymphocytic Colitis.  Started on budesonide   HPI:     Patient is a 78 y.o. male presenting to the Gastroenterology Clinic for routine follow-up.  Requesting refill of budesonide .  Was last seen by me in the office on 01/21/2023.  Was doing well at that time, but unable to fully titrate off to budesonide .  Was at 6 mg/day at that time and had intermittent breakthrough symptoms at 3 mg/day (intermittent loose stools).  Plan at that time was to continue at 6 mg daily, which she has done with excellent response.  Reflux otherwise well-controlled with Prilosec .  Recent C4-5 posterior decompression with C4-7 instrumentation in 01/2024.  Overall doing well postoperatively.  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; Gleason 5+5; PSA 1.8 --> Tx'd with XRT (12/10/21 - 02/18/22) + LT-ADT; XRT course truncated due to severe radiation colitis   Allergy    Anemia    Anxiety    a.) on BZO PRN (diazepam )   Aortic atherosclerosis (HCC)    Bilateral carotid  artery disease (HCC) 03/24/2021  a.) carotid doppler 03/24/2021: 1-39% BICA   BPH with urinary obstruction    CAD (coronary artery disease) 03/26/2011   a.) cCTA 03/26/2011: Ca2+ 505 (pRCA + LM); b.) LHC 04/01/2011: 40-50% oLM, 60-70% m-dLAD, 30% mD2, 60% pRCA - med mgmt; c.) LHC 10/17/2022L 90% oLM, 60% o-pLCx, 95% pRCA, 70% dRCA, 70% RPDA --> CVTS consult; d.) s/p 4v CABG 07/22/2021   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   Chronic diastolic CHF (congestive heart failure) (HCC) 01/14/2017   a.) TTE 01/14/2017: TTE 01/14/2017: EF 55-60%, sev LA dil, triv MR, G1DD; b.) TTE 03/23/2018: EF 55-60%, mod LVH, sev LA dil, mod-sev RA dil; c.) TTE 04/17/2020: EF 60-65%, mod asym ant seg LVH, mild LA dil, mild MR, G1DD; d.) TTE 03/25/2021: EF 50-55%, AoV sclerosis, triv MR   CKD (chronic kidney disease), stage III (HCC)    COPD (chronic obstructive pulmonary disease) (HCC)    DDD (degenerative disc disease), cervical    a.) s/p ACDF C5-C7 06/08/2002   DOE (dyspnea on exertion)    Eczema    Emphysema of lung (HCC)    Family history of adverse reaction to anesthesia    a.) PONV and postoperative delirium/agitation in 1st degree relatives (sisters)   GERD (gastroesophageal reflux disease)    Glaucoma, left eye    Heart murmur    Hemorrhoids    History of adenomatous polyp of colon    History of bilateral cataract extraction 2021   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 Winn Havens--- bilateral eye injection every 6 wks   NSVT (nonsustained ventricular tachycardia) (HCC) 03/17/2022   a.) Zio patch 03/17/2022: 3 runs with fastest lasting 10 beats at rate of 190 bpm and longest lasting 16 beats at rate of 121 bpm   On apixaban  therapy    Osteoporosis 05/31/2016   PAF (paroxysmal atrial fibrillation) (HCC) 03/2012   a.) CHA2DS2VASc = 7 (age x2, CHF, HTN, TIA x2, vascular disease history);  b.) s/p DCCV 04/25/2018 (150J x 1 --> SB); c.) rate/rhythm maintained on oral metoprolol  tartrate; chronically anticoagulated with apixaban    Postoperative ileus (HCC) 07/2021   a.) following CABG procedure   RBBB (right bundle branch block)    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   a.) LIMA-LAD, SVG-OM1, sequential SVG-acute marginal-PDA   SNHL (sensorineural hearing loss)    Statin intolerance    Vision loss of left eye    macular degeneration   Vitamin D  deficiency     Patient's surgical history, family medical history, social history, medications and allergies were all reviewed in Epic    Current Outpatient Medications  Medication Sig Dispense Refill   acetaminophen  (TYLENOL ) 500 MG tablet  Take 1,000 mg by mouth at bedtime.     aspirin  EC 81 MG EC tablet Take 1 tablet (81 mg total) by mouth daily. Swallow whole. 30 tablet 11   budesonide  (ENTOCORT EC ) 3 MG 24 hr capsule TAKE 2 CAPSULES(6 MG) BY MOUTH DAILY 180 capsule 0   cholecalciferol  (VITAMIN D3) 25 MCG (1000 UNIT) tablet Take 10,000 Units by mouth 2 (two) times a week. Monday and Friday     diazepam  (VALIUM ) 5 MG tablet TAKE 1/2 TO 2 TABLETS(2.5 TO 10 MG) BY MOUTH EVERY 12 HOURS AS NEEDED FOR ANXIETY 30 tablet 2   diphenoxylate -atropine  (LOMOTIL ) 2.5-0.025 MG tablet Take 1 tablet by mouth 4 (four) times daily as needed for diarrhea or loose stools.     [Paused]  ELIQUIS  5 MG TABS tablet TAKE 1 TABLET BY MOUTH TWICE  DAILY 180 tablet 3   Evolocumab  (REPATHA  SURECLICK) 140 MG/ML SOAJ Inject 140 mg into the skin every 14 (fourteen) days. 6 mL 3   furosemide  (LASIX ) 20 MG tablet TAKE 1 TABLET BY MOUTH DAILY AS  NEEDED 90 tablet 3   latanoprost  (XALATAN ) 0.005 % ophthalmic solution Place 1 drop into the left eye at bedtime.      levalbuterol  (XOPENEX  HFA) 45 MCG/ACT inhaler Inhale 2 puffs into the lungs every 4 (four) hours as needed for wheezing or shortness of breath. 15 g 5   magnesium  oxide (MAG-OX) 400 (240 Mg) MG tablet TAKE 1 TABLET(400 MG) BY MOUTH THREE TIMES DAILY 270 tablet 1   metoprolol  tartrate (LOPRESSOR ) 100 MG tablet TAKE 1 TABLET BY MOUTH TWICE  DAILY 180 tablet 3   Multiple Vitamins-Minerals (PRESERVISION AREDS 2) CAPS Take 1 capsule by mouth daily.     nitroGLYCERIN  (NITROSTAT ) 0.4 MG SL tablet Place 1 tablet (0.4 mg total) under the tongue every 5 (five) minutes as needed for chest pain. 30 tablet 0   omeprazole  (PRILOSEC  OTC) 20 MG tablet Take 20 mg by mouth every morning.     Ranibizumab  (LUCENTIS  IO) Inject 1 Dose into the eye every 6 (six) weeks. Bilateral eye by Dr Edyth Grana     tamsulosin  (FLOMAX ) 0.4 MG CAPS capsule Take 0.4 mg by mouth 2 (two) times daily.     traMADol  (ULTRAM ) 50 MG tablet Take 1 tablet (50 mg total) by mouth every 6 (six) hours as needed. 20 tablet 0   No current facility-administered medications for this visit.    Physical Exam:     Ht 5\' 4"  (1.626 m)   Wt 183 lb 2 oz (83.1 kg)   BMI 31.43 kg/m   GENERAL:  Pleasant male in NAD PSYCH: : Cooperative, normal affect NEURO: Alert and oriented x 3, no focal neurologic deficits   IMPRESSION and PLAN:    1) Lymphocytic Colitis Unable to titrate off budesonide  as he has mild intermittent symptoms at 3 mg/day, and breakthrough within a couple days of complete cessation.  Symptoms well-controlled at 6 mg daily.   We again discussed the risk/benefit  profile of long-term budesonide  use.  He is not a candidate for immunomodulators therapy given active cancer treatment.  No response to cholestyramine  in the past.   After a thorough discussion, he is strongly in favor of continued budesonide  6 mg daily as he feels the benefits far outweigh the risks.   - Refilled budesonide  6 mg daily, 90-day supply with RF5 - Ok to use Lomotil  on demand if breakthrough   2) GERD - Well-controlled with Prilosec  20 mg daily (brand  name only; generic omeprazole  is not efficacious) - Had previously discussed potential relationship of PPI and Lymphocytic Colitis, but no improvement with previous trial of H2 blocker, so patient would like to continue with PPI per prior discussions - Continue antireflux lifestyle/dietary modifications - Another consideration would be changing for Voquenza, which to my knowledge is not associated with [facility].  He would like to continue with current therapy for the time being though.     RTC in 1 year or sooner prn          Annis Kinder ,DO, FACG 01/31/2024, 1:14 PM

## 2024-01-31 NOTE — Patient Instructions (Signed)
 _______________________________________________________  If your blood pressure at your visit was 140/90 or greater, please contact your primary care physician to follow up on this.  _______________________________________________________  If you are age 77 or older, your body mass index should be between 23-30. Your Body mass index is 31.43 kg/m. If this is out of the aforementioned range listed, please consider follow up with your Primary Care Provider.  If you are age 48 or younger, your body mass index should be between 19-25. Your Body mass index is 31.43 kg/m. If this is out of the aformentioned range listed, please consider follow up with your Primary Care Provider.   ________________________________________________________  The [facility] providers would like to encourage you to use MYCHART to communicate with providers for non-urgent requests or questions.  Due to long hold times on the telephone, sending your provider a message by [facility] may be a faster and more efficient way to get a response.  Please allow 48 business hours for a response.  Please remember that this is for non-urgent requests.  _______________________________________________________  We have sent the following medications to your pharmacy for you to pick up at your convenience:  CONTINUE: Budesonide  6mg  daily  You will need a follow up in 1 year.  We will contact you to schedule this appointment.  It was a pleasure to see you today!  Vito Cirigliano, D.O.

## 2024-02-01 ENCOUNTER — Encounter: Payer: Medicare Other | Admitting: Orthopedic Surgery

## 2024-02-02 ENCOUNTER — Other Ambulatory Visit (HOSPITAL_BASED_OUTPATIENT_CLINIC_OR_DEPARTMENT_OTHER): Payer: Self-pay

## 2024-02-02 ENCOUNTER — Encounter: Payer: Self-pay | Admitting: Neurosurgery

## 2024-02-02 ENCOUNTER — Other Ambulatory Visit (HOSPITAL_COMMUNITY): Payer: Self-pay

## 2024-02-02 DIAGNOSIS — I130 Hypertensive heart and chronic kidney disease with heart failure and stage 1 through stage 4 chronic kidney disease, or unspecified chronic kidney disease: Secondary | ICD-10-CM | POA: Diagnosis not present

## 2024-02-02 DIAGNOSIS — G9589 Other specified diseases of spinal cord: Secondary | ICD-10-CM | POA: Diagnosis not present

## 2024-02-02 DIAGNOSIS — M4802 Spinal stenosis, cervical region: Secondary | ICD-10-CM | POA: Diagnosis not present

## 2024-02-02 DIAGNOSIS — Z4789 Encounter for other orthopedic aftercare: Secondary | ICD-10-CM | POA: Diagnosis not present

## 2024-02-02 DIAGNOSIS — G959 Disease of spinal cord, unspecified: Secondary | ICD-10-CM | POA: Diagnosis not present

## 2024-02-02 DIAGNOSIS — I5032 Chronic diastolic (congestive) heart failure: Secondary | ICD-10-CM | POA: Diagnosis not present

## 2024-02-03 DIAGNOSIS — H353231 Exudative age-related macular degeneration, bilateral, with active choroidal neovascularization: Secondary | ICD-10-CM | POA: Diagnosis not present

## 2024-02-05 ENCOUNTER — Emergency Department (HOSPITAL_BASED_OUTPATIENT_CLINIC_OR_DEPARTMENT_OTHER)
Admission: EM | Admit: 2024-02-05 | Discharge: 2024-02-05 | Disposition: A | Discharge status: Home / Self Care | Attending: Emergency Medicine | Admitting: Emergency Medicine

## 2024-02-05 ENCOUNTER — Other Ambulatory Visit (HOSPITAL_BASED_OUTPATIENT_CLINIC_OR_DEPARTMENT_OTHER): Payer: Self-pay

## 2024-02-05 ENCOUNTER — Other Ambulatory Visit: Payer: Self-pay

## 2024-02-05 ENCOUNTER — Encounter (HOSPITAL_BASED_OUTPATIENT_CLINIC_OR_DEPARTMENT_OTHER): Payer: Self-pay | Admitting: Emergency Medicine

## 2024-02-05 ENCOUNTER — Emergency Department (HOSPITAL_BASED_OUTPATIENT_CLINIC_OR_DEPARTMENT_OTHER)

## 2024-02-05 DIAGNOSIS — J439 Emphysema, unspecified: Secondary | ICD-10-CM | POA: Diagnosis not present

## 2024-02-05 DIAGNOSIS — Z981 Arthrodesis status: Secondary | ICD-10-CM | POA: Diagnosis not present

## 2024-02-05 DIAGNOSIS — Z7901 Long term (current) use of anticoagulants: Secondary | ICD-10-CM | POA: Insufficient documentation

## 2024-02-05 DIAGNOSIS — Z7982 Long term (current) use of aspirin: Secondary | ICD-10-CM | POA: Insufficient documentation

## 2024-02-05 DIAGNOSIS — M542 Cervicalgia: Secondary | ICD-10-CM | POA: Insufficient documentation

## 2024-02-05 DIAGNOSIS — M8580 Other specified disorders of bone density and structure, unspecified site: Secondary | ICD-10-CM | POA: Diagnosis not present

## 2024-02-05 MED ORDER — OXYCODONE HCL 5 MG PO TABS
5.0000 mg | ORAL_TABLET | Freq: Once | ORAL | Status: AC
  Administered 2024-02-05: 5 mg via ORAL
  Filled 2024-02-05: qty 1

## 2024-02-05 MED ORDER — CYCLOBENZAPRINE HCL 5 MG PO TABS
5.0000 mg | ORAL_TABLET | Freq: Two times a day (BID) | ORAL | 0 refills | Status: DC | PRN
  Filled 2024-02-05: qty 10, 5d supply, fill #0

## 2024-02-05 MED ORDER — CYCLOBENZAPRINE HCL 5 MG PO TABS
5.0000 mg | ORAL_TABLET | Freq: Once | ORAL | Status: AC
  Administered 2024-02-05: 5 mg via ORAL
  Filled 2024-02-05: qty 1

## 2024-02-05 MED ORDER — ACETAMINOPHEN 500 MG PO TABS
1000.0000 mg | ORAL_TABLET | Freq: Once | ORAL | Status: AC
  Administered 2024-02-05: 1000 mg via ORAL
  Filled 2024-02-05: qty 2

## 2024-02-05 NOTE — ED Triage Notes (Signed)
 28 days ago had neck surgery, placed rods.yesterday he bent over and felt /heard something pop in his neck and has had lots of pain, he took 5mg  oxycodone  at 0730.

## 2024-02-05 NOTE — ED Notes (Signed)
 AVS given.Health teaching done.Questions answered satisfactorily.Discharge ambulatory with cane accompanied by wife.

## 2024-02-05 NOTE — ED Provider Notes (Signed)
  EMERGENCY DEPARTMENT AT [facility] Provider Note   CSN: [id number] Arrival date & time: 02/05/24  1107     History  Chief Complaint  Patient presents with   Neck Pain    Ryan [name] is a 78 y.o. male.  Patient here with increased pain to the back of his neck.  Had surgery about a month ago to put rods in his cervical spine.  He bent over yesterday felt something kind of pull and has had pain since.  Has been taking his oxycodone  with some relief.  He denies any weakness numbness tingling.  Denies any fever chills.  He has been doing well otherwise since his surgery.  He has been wearing his neck brace.  The history is provided by the patient.       Home Medications Prior to Admission medications   Medication Sig Start Date End Date Taking? Authorizing Provider  acetaminophen  (TYLENOL ) 500 MG tablet Take 1,000 mg by mouth at bedtime.    [provider]  aspirin  EC 81 MG EC tablet Take 1 tablet (81 mg total) by mouth daily. Swallow whole. 08/01/21   Roddenberry, Myron G, PA-C  budesonide  (ENTOCORT EC ) 3 MG 24 hr capsule Take 2 capsules (6 mg total) by mouth daily. 01/31/24   Cirigliano, Vito V, DO  cholecalciferol  (VITAMIN D3) 25 MCG (1000 UNIT) tablet Take 10,000 Units by mouth 2 (two) times a week. Monday and Friday    [provider]  diazepam  (VALIUM ) 5 MG tablet TAKE 1/2 TO 2 TABLETS(2.5 TO 10 MG) BY MOUTH EVERY 12 HOURS AS NEEDED FOR ANXIETY 06/28/23   Neda Balk, MD  diphenoxylate -atropine  (LOMOTIL ) 2.5-0.025 MG tablet Take 1 tablet by mouth 4 (four) times daily as needed for diarrhea or loose stools.    [provider]  ELIQUIS  5 MG TABS tablet TAKE 1 TABLET BY MOUTH TWICE  DAILY 11/22/23   Tammie Fall, MD  Evolocumab  (REPATHA  SURECLICK) 140 MG/ML SOAJ Inject 140 mg into the skin every 14 (fourteen) days. 03/10/23   Tammie Fall, MD  furosemide  (LASIX ) 20 MG tablet TAKE 1 TABLET BY MOUTH DAILY AS  NEEDED 12/22/21    Neda Balk, MD  latanoprost  (XALATAN ) 0.005 % ophthalmic solution Place 1 drop into the left eye at bedtime.     [provider]  levalbuterol  (XOPENEX  HFA) 45 MCG/ACT inhaler Inhale 2 puffs into the lungs every 4 (four) hours as needed for wheezing or shortness of breath. 08/02/23   Neda Balk, MD  magnesium  oxide (MAG-OX) 400 (240 Mg) MG tablet TAKE 1 TABLET(400 MG) BY MOUTH THREE TIMES DAILY 11/29/23   Neda Balk, MD  metoprolol  tartrate (LOPRESSOR ) 100 MG tablet TAKE 1 TABLET BY MOUTH TWICE  DAILY 02/15/23   Tammie Fall, MD  Multiple Vitamins-Minerals (PRESERVISION AREDS 2) CAPS Take 1 capsule by mouth daily.    [provider]  nitroGLYCERIN  (NITROSTAT ) 0.4 MG SL tablet Place 1 tablet (0.4 mg total) under the tongue every 5 (five) minutes as needed for chest pain. 04/14/20   Claretta Croft, MD  omeprazole  (PRILOSEC  OTC) 20 MG tablet Take 20 mg by mouth every morning.    [provider]  Ranibizumab  (LUCENTIS  IO) Inject 1 Dose into the eye every 6 (six) weeks. Bilateral eye by Dr Edyth Grana    [provider]  tamsulosin  (FLOMAX ) 0.4 MG CAPS capsule Take 0.4 mg by mouth 2 (two) times daily.    [provider]  traMADol  (ULTRAM ) 50 MG tablet Take 1 tablet (50 mg total) by mouth every 6 (six) hours as needed. 01/28/24 01/27/25  Jodeen Munch, MD      Allergies    Albumin  (human), Guaifenesin, Polymyxin b-trimethoprim, Pseudoephedrine, Codeine, Guaiacol, Polymyxin b, Statins, Atorvastatin, Brimonidine  tartrate, Gabapentin, Meloxicam, Peppermint flavoring agent (non-screening), Pseudoephedrine-guaifenesin, Pseudoephedrine-guaifenesin, Rosuvastatin calcium , Rosuvastatin calcium , Simvastatin, Tapentadol, Ciprofloxacin , Moxifloxacin, and Rofecoxib    Review of Systems   Review of Systems  Physical Exam Updated Vital Signs BP (!) 140/70   Pulse 97   Temp (!) 97.3 F (36.3 C)   Resp 18   SpO2 96%  Physical Exam Vitals and  nursing note reviewed.  Constitutional:      General: He is not in acute distress.    Appearance: He is well-developed. He is not ill-appearing.  HENT:     Head: Normocephalic and atraumatic.     Nose: Nose normal.     Mouth/Throat:     Mouth: Mucous membranes are moist.  Eyes:     Extraocular Movements: Extraocular movements intact.     Conjunctiva/sclera: Conjunctivae normal.     Pupils: Pupils are equal, round, and reactive to light.  Neck:     Comments: Tenderness to the paraspinal cervical muscles but no obvious step-offs or midline spinal tenderness Cardiovascular:     Rate and Rhythm: Normal rate and regular rhythm.     Heart sounds: No murmur heard. Pulmonary:     Effort: Pulmonary effort is normal. No respiratory distress.     Breath sounds: Normal breath sounds.  Abdominal:     Palpations: Abdomen is soft.     Tenderness: There is no abdominal tenderness.  Musculoskeletal:        General: No swelling.     Cervical back: Neck supple.  Skin:    General: Skin is warm and dry.     Capillary Refill: Capillary refill takes less than 2 seconds.  Neurological:     General: No focal deficit present.     Mental Status: He is alert and oriented to person, place, and time.     Cranial Nerves: No cranial nerve deficit.     Sensory: No sensory deficit.     Motor: No weakness.     Comments: 5+ out of 5 strength all, normal sensation  Psychiatric:        Mood and Affect: Mood normal.     ED Results / Procedures / Treatments   Labs (all labs ordered are listed, but only abnormal results are displayed) Labs Reviewed - No data to display  EKG None  Radiology CT Cervical Spine Wo Contrast Result Date: 02/05/2024 CLINICAL DATA:  78 year old male with neck pain after bending. Previous spine surgery. EXAM: CT CERVICAL SPINE WITHOUT CONTRAST TECHNIQUE: Multidetector CT imaging of the cervical spine was performed without intravenous contrast. Multiplanar CT image reconstructions  were also generated. RADIATION DOSE REDUCTION: This exam was performed according to the departmental dose-optimization program which includes automated exposure control, adjustment of the mA and/or kV according to patient size and/or use of iterative reconstruction technique. COMPARISON:  CT cervical spine 01/01/2024 and earlier. FINDINGS: Alignment: Stable straightening of cervical lordosis. Cervicothoracic junction alignment is within normal limits. Bilateral posterior element alignment is within normal limits. Skull base and vertebrae: Postoperative details are below. Stable background bone mineralization, some osteopenia. Visualized skull base is intact. No atlanto-occipital dissociation. C1 and C2 appear chronically degenerated, intact and aligned. No acute osseous abnormality identified. Soft tissues and  spinal canal: No prevertebral fluid or swelling. No visible canal hematoma. Postoperative changes to the posterior paraspinal soft tissues, new since March. No adverse features identified. Stable and negative visible ventral neck soft tissues. Disc levels: Chronic ACDF C5-C6 and C6-C7 with solid arthrodesis at the latter. New since 01/01/2024 is C4-C5 and C5-C6 posterior decompression plus placement of bilateral C4 through C6 posterior laminar fusion hardware. No adverse features identified. Stable degeneration elsewhere. Upper chest: Visible upper thoracic levels appear intact. Chronic emphysema and scarring in the lung apices. IMPRESSION: 1.  No acute osseous abnormality in the cervical spine. 2. Recent C4-C5 and C5-C6 posterior decompression and posterior laminar fusion hardware placement with no adverse features. 3. Pre-existing C5-C6 and C6-C7 ACDF. Solid arthrodesis at the latter. 4.  Emphysema (ICD10-J43.9). Electronically Signed   By: Marlise Simpers M.D.   On: 02/05/2024 12:11    Procedures Procedures    Medications Ordered in ED Medications  cyclobenzaprine (FLEXERIL) tablet 5 mg (5 mg Oral Given  02/05/24 1159)  oxyCODONE  (Oxy IR/ROXICODONE ) immediate release tablet 5 mg (5 mg Oral Given 02/05/24 1159)  acetaminophen  (TYLENOL ) tablet 1,000 mg (1,000 mg Oral Given 02/05/24 1159)    ED Course/ Medical Decision Making/ A&P                                 Medical Decision Making Amount and/or Complexity of Data Reviewed Radiology: ordered.  Risk OTC drugs. Prescription drug management.   Felicie Horning is here with neck pain.  Concern for may be hardware issue.  He had surgery to his neck about a month ago.  He bent over to pick up something yesterday and felt a pull in the back of his neck and has been having some discomfort since.  Has been taking narcotic pain medicine with some relief.  Denies any weakness numbness tingling.  He has not had any fever or chills.  He had been doing very well since his surgery until he bent over yesterday.  Overall he has normal vitals.  He has normal neurological exam.  Normal strength and sensation.  He is tender to the paraspinal cervical muscles.  Overall we will get a CT scan of the neck to see if there is any hardware issues.  Could be muscle pull.  Will give oxycodone  Tylenol  Flexeril get CT scan and reevaluate.  CT scan shows no acute findings.  There does not appear to be any issues with the hardware on CT as well.  I do think maybe he has a muscle spasm.  Will add Flexeril to his pain regimen at home and have him follow-up with the surgeon.  Discharged in good condition.  He has got normal strength and sensation.  Have no concern for spinal cord issue.  He understands return precautions.  Discharge.  This chart was dictated using voice recognition software.  Despite best efforts to proofread,  errors can occur which can change the documentation meaning.         Final Clinical Impression(s) / ED Diagnoses Final diagnoses:  Neck pain    Rx / DC Orders ED Discharge Orders     None         Lowery Rue, DO 02/05/24 1227

## 2024-02-05 NOTE — Discharge Instructions (Signed)
 Follow-up with your neurosurgeon.  Continue your pain medication at home.  Have added a muscle relaxant called Flexeril.  These medications are sedating so please be careful with its use.  Follow-up with surgeon or return if symptoms worsen.

## 2024-02-06 NOTE — Assessment & Plan Note (Signed)
 No recent exacerbation. Following with cardiology

## 2024-02-06 NOTE — Assessment & Plan Note (Signed)
Labs stable no new concerns, no changes

## 2024-02-06 NOTE — Assessment & Plan Note (Signed)
 [id number] acceptable, minimize simple carbs. Increase exercise as tolerated.

## 2024-02-06 NOTE — Assessment & Plan Note (Signed)
 Seen in ER with increased neck pain this past weekend after surgery last month. CT scan of neck not remarkable for any acute concerns. Given a trial of Flexeril to try.

## 2024-02-06 NOTE — Assessment & Plan Note (Signed)
 Well controlled, no changes to meds. Encouraged heart healthy diet such as the DASH diet and exercise as tolerated.

## 2024-02-06 NOTE — Assessment & Plan Note (Signed)
Rate controlled and following with cardiology

## 2024-02-06 NOTE — Assessment & Plan Note (Signed)
Encourage heart healthy diet such as MIND or DASH diet, increase exercise, avoid trans fats, simple carbohydrates and processed foods, consider a krill or fish or flaxseed oil cap daily.  Tolerating Repatha 

## 2024-02-06 NOTE — Assessment & Plan Note (Signed)
Is using albuterol in the morning 1 or 2 puffs depending on activity level. And he uses it prior to exercise. That seems to be working

## 2024-02-07 ENCOUNTER — Ambulatory Visit (INDEPENDENT_AMBULATORY_CARE_PROVIDER_SITE_OTHER): Payer: Medicare Other | Admitting: Family Medicine

## 2024-02-07 ENCOUNTER — Encounter: Payer: Self-pay | Admitting: Family Medicine

## 2024-02-07 VITALS — BP 128/78 | HR 52 | Resp 16 | Ht 64.0 in | Wt 177.0 lb

## 2024-02-07 DIAGNOSIS — D649 Anemia, unspecified: Secondary | ICD-10-CM

## 2024-02-07 DIAGNOSIS — R739 Hyperglycemia, unspecified: Secondary | ICD-10-CM | POA: Diagnosis not present

## 2024-02-07 DIAGNOSIS — I5032 Chronic diastolic (congestive) heart failure: Secondary | ICD-10-CM

## 2024-02-07 DIAGNOSIS — J449 Chronic obstructive pulmonary disease, unspecified: Secondary | ICD-10-CM | POA: Diagnosis not present

## 2024-02-07 DIAGNOSIS — M4802 Spinal stenosis, cervical region: Secondary | ICD-10-CM

## 2024-02-07 DIAGNOSIS — I4819 Other persistent atrial fibrillation: Secondary | ICD-10-CM | POA: Diagnosis not present

## 2024-02-07 DIAGNOSIS — G959 Disease of spinal cord, unspecified: Secondary | ICD-10-CM | POA: Diagnosis not present

## 2024-02-07 DIAGNOSIS — E782 Mixed hyperlipidemia: Secondary | ICD-10-CM | POA: Diagnosis not present

## 2024-02-07 DIAGNOSIS — E559 Vitamin D deficiency, unspecified: Secondary | ICD-10-CM

## 2024-02-07 DIAGNOSIS — I10 Essential (primary) hypertension: Secondary | ICD-10-CM | POA: Diagnosis not present

## 2024-02-07 NOTE — Progress Notes (Signed)
 Subjective:    Patient ID: Ryan [name], male    DOB: 1946-08-14, 78 y.o.   MRN: [id number]  Chief Complaint  Patient presents with   Medical Management of Chronic Issues    Patient presents today for a 3 month follow-up.   Quality Metric Gaps    AWV, lung cancer screening    HPI Discussed the use of AI scribe software for clinical note transcription with the patient, who gave verbal consent to proceed.  History of Present Illness Ryan [name] "Ryan [name]" is a 78 year old male who presents with persistent neck pain following recent surgery.  He is experiencing significant neck pain following a recent surgical procedure where muscles and nerves were cut to access the area of concern. The pain is primarily in the neck, not the left arm, which was initially thought to be the source of discomfort. The surgery involved the placement of two rods with three screws each, and he describes the incision as being quite long. He has been using ice packs to manage the warmth and discomfort in the neck, as heat exacerbates the pain.  He was initially prescribed oxycodone  for pain management but requested hydrocodone  instead. Recently, he experienced gastrointestinal upset, including nausea, after taking tramadol , which led him to discontinue its use and return to oxycodone . He is currently taking hydrocodone , with six pills remaining, and supplements this with Tylenol , taking two tablets in the morning, afternoon, and night.  He has a history of Crohn's disease and is currently on budesonide  and Lomotil  as needed. Sometimes the budesonide  does not fully manage his symptoms, necessitating the use of Lomotil . He also mentions a history of low red blood cell counts and has recently started taking over-the-counter B12 supplements after being informed of low energy levels.  He experienced a recent incident where he twisted and heard a pop, fearing a broken screw, but imaging showed no mechanical damage, only  muscle spasms. He has been advised to reduce the intake of leafy greens due to their impact on his blood work, although he occasionally indulges in them.  He also mentions ongoing issues with psoriasis and wants relief from this condition.  No systemic symptoms such as body aches, fevers, or chills. Reports good bowel movements and appetite.    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; Gleason 5+5; PSA 1.8 --> Tx'd with XRT (12/10/21 - 02/18/22) + LT-ADT; XRT course truncated due to severe radiation colitis   Allergy    Anemia    Anxiety    a.) on BZO PRN (diazepam )   Aortic atherosclerosis (HCC)    Bilateral carotid artery disease (HCC) 03/24/2021   a.) carotid doppler 03/24/2021: 1-39% BICA   BPH with urinary obstruction    CAD (coronary artery disease) 03/26/2011   a.) cCTA 03/26/2011: Ca2+ 505 (pRCA + LM); b.) LHC 04/01/2011: 40-50% oLM, 60-70% m-dLAD, 30% mD2, 60% pRCA - med mgmt; c.) LHC 10/17/2022L 90% oLM, 60% o-pLCx, 95% pRCA, 70% dRCA, 70% RPDA --> CVTS consult; d.) s/p 4v CABG 07/22/2021   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   Chronic diastolic CHF (congestive heart failure) (HCC) 01/14/2017   a.) TTE 01/14/2017: TTE 01/14/2017: EF 55-60%, sev LA dil, triv MR, G1DD; b.) TTE 03/23/2018: EF 55-60%, mod LVH, sev LA dil, mod-sev RA dil; c.) TTE 04/17/2020: EF 60-65%, mod asym  ant seg LVH, mild LA dil, mild MR, G1DD; d.) TTE 03/25/2021: EF 50-55%, AoV sclerosis, triv MR   CKD (chronic kidney disease), stage III (HCC)    COPD (chronic obstructive pulmonary disease) (HCC)    DDD (degenerative disc disease), cervical    a.) s/p ACDF C5-C7 06/08/2002   DOE (dyspnea on exertion)    Eczema    Emphysema of lung (HCC)    Family history of adverse reaction to anesthesia    a.) PONV and postoperative delirium/agitation  in 1st degree relatives (sisters)   GERD (gastroesophageal reflux disease)    Glaucoma, left eye    Heart murmur    Hemorrhoids    History of adenomatous polyp of colon    History of bilateral cataract extraction 2021   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 Winn Havens--- bilateral eye injection every 6 wks   NSVT (nonsustained ventricular tachycardia) (HCC) 03/17/2022   a.) Zio patch 03/17/2022: 3 runs with fastest lasting 10 beats at rate of 190 bpm and longest lasting 16 beats at rate of 121 bpm   On apixaban  therapy    Osteoporosis 05/31/2016   PAF (paroxysmal atrial fibrillation) (HCC) 03/2012   a.) CHA2DS2VASc = 7 (age x2, CHF, HTN, TIA x2, vascular disease history);  b.) s/p DCCV 04/25/2018 (150J x 1 --> SB); c.) rate/rhythm maintained on oral metoprolol  tartrate; chronically anticoagulated with apixaban    Postoperative ileus (HCC) 07/2021   a.) following CABG procedure   RBBB (right bundle branch block)    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   a.) LIMA-LAD, SVG-OM1, sequential SVG-acute marginal-PDA   SNHL (sensorineural hearing loss)    Statin intolerance    Vision loss of left eye    macular degeneration   Vitamin D  deficiency     Past Surgical History:  Procedure Laterality Date   ANTERIOR CERVICAL  DECOMP/DISCECTOMY FUSION  06/08/2002   APPENDECTOMY  1953   CARDIAC CATHETERIZATION Left 04/01/2011   CARDIOVERSION N/A 04/25/2018   Procedure: CARDIOVERSION;  Surgeon: Maudine Sos, MD;  Location[facility] ENDOSCOPY;  Service: Cardiovascular;  Laterality: N/A;   CATARACT EXTRACTION W/ INTRAOCULAR LENS IMPLANT Bilateral 2021   COLONOSCOPY WITH ESOPHAGOGASTRODUODENOSCOPY (EGD)  01/28/2021   by AOZHYQ   CORONARY ARTERY BYPASS GRAFT N/A 07/22/2021   Procedure: 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, ON PUMP, USING LEFT INTERNAL MAMMARY ARTERY AND ENDOSCOPICALLY HARVESTED RIGHT GREATER SAPHENOUS VEIN;  Surgeon: Zelphia Higashi, MD;  Location: [facility] OR;  Service: Open Heart Surgery;  Laterality: N/A;   ENDOVEIN HARVEST OF GREATER SAPHENOUS VEIN  07/22/2021   Procedure: ENDOVEIN HARVEST OF GREATER SAPHENOUS VEIN;  Surgeon: Zelphia Higashi, MD;  Location[facility]  OR;  Service: Open Heart Surgery;;   EXTRACORPOREAL SHOCK WAVE LITHOTRIPSY     x2  1990s   EYE SURGERY     FOOT SURGERY Right 1970   calcification removed from top of foot   GOLD SEED IMPLANT N/A 11/28/2021   Procedure: GOLD SEED IMPLANT;  Surgeon: Christina Coyer, MD;  Location: [facility];  Service: Urology;  Laterality: N/A;   KNEE ARTHROSCOPY Right 1991   LAPAROSCOPIC CHOLECYSTECTOMY  2000   LEFT HEART CATH AND CORONARY ANGIOGRAPHY N/A 07/21/2021   Procedure: LEFT HEART CATH AND CORONARY ANGIOGRAPHY;  Surgeon: Avanell Leigh, MD;  Location: [facility] INVASIVE CV LAB;  Service: Cardiovascular;  Laterality: N/A;   LUMBAR LAMINECTOMY/DECOMPRESSION MICRODISCECTOMY N/A 05/17/2023   Procedure: L4-5 DECOMPRESSION;  Surgeon: Jodeen Munch, MD;  Location: [facility] ORS;  Service: Neurosurgery;  Laterality: N/A;   POSTERIOR CERVICAL FUSION/FORAMINOTOMY N/A 01/10/2024   Procedure: C4-7 POSTERIOR SPINAL INSTRUMENTATION;  Surgeon: Jodeen Munch, MD;  Location: [facility] ORS;  Service: Neurosurgery;  Laterality: N/A;    POSTERIOR CERVICAL LAMINECTOMY N/A 01/10/2024   Procedure: C4-5 LAMINECTOMY;  Surgeon: Jodeen Munch, MD;  Location: [facility] ORS;  Service: Neurosurgery;  Laterality: N/A;   SPACE OAR INSTILLATION N/A 11/28/2021   Procedure: SPACE OAR INSTILLATION;  Surgeon: Christina Coyer, MD;  Location: [facility];  Service: Urology;  Laterality: N/A;   TEE WITHOUT CARDIOVERSION N/A 07/22/2021   Procedure: TRANSESOPHAGEAL ECHOCARDIOGRAM (TEE);  Surgeon: Zelphia Higashi, MD;  Location: [facility] OR;  Service: Open Heart Surgery;  Laterality: N/A;   TOTAL KNEE ARTHROPLASTY Right 08/14/2009   @[facility]    TRANSURETHRAL RESECTION OF PROSTATE N/A 07/17/2021   Procedure: TRANSURETHRAL RESECTION OF THE PROSTATE (TURP);  Surgeon: Trent Frizzle, MD;  Location: [facility];  Service: Urology;  Laterality: N/A;  1 HR    Family History  Problem Relation Age of Onset   Heart disease Mother    Diabetes Mother    Cirrhosis Mother    Emphysema Mother        never smoked but 2nd hand through her spouse   Hypertension Mother    Macular degeneration Mother    Heart disease Father    Cancer Father        prostate   Hyperlipidemia Father    Hypertension Father    Varicose Veins Father    Heart attack Father    Peripheral vascular disease Father    Heart disease Sister    Arthritis Sister    Hyperlipidemia Sister    Obesity Sister    Macular degeneration Sister    Heart disease Brother        5 stents   Hyperlipidemia Brother    Macular degeneration Maternal Grandfather    Cirrhosis Sister    Obesity Sister    Arthritis Sister    Heart disease Sister    Obesity Sister    Liver disease Other    Prostate cancer Other    Coronary artery disease Other    Colon cancer Neg Hx    Esophageal cancer Neg Hx    Rectal cancer Neg Hx    Stomach cancer Neg Hx     Social History   Socioeconomic History   Marital status: Married    Spouse name: Leola Raisin   Number of children: 0    Years of education: Not on file   Highest education level: Associate degree: academic program  Occupational History   Occupation: retired    Associate Professor: RETIRED  Tobacco Use  Smoking status: Former    Current packs/day: 0.00    Average packs/day: 0.5 packs/day for 50.0 years (25.0 ttl pk-yrs)    Types: Cigarettes    Start date: 05/02/1959    Quit date: 05/01/2009    Years since quitting: 14.7   Smokeless tobacco: Former    Types: Chew    Quit date: 1995   Tobacco comments:    Former smoker 01/27/22  Vaping Use   Vaping status: Never Used  Substance and Sexual Activity   Alcohol use: Yes    Alcohol/week: 2.0 - 3.0 standard drinks of alcohol    Types: 1 - 2 Glasses of wine, 1 Cans of beer per week    Comment: 1-2 glasses wine/beer daily   Drug use: Never   Sexual activity: Yes    Comment: lives with wife, no dietary restrictions  Other Topics Concern   Not on file  Social History Narrative   Lives with wife   Social Drivers of Health   Financial Resource Strain: Low Risk  (10/09/2023)   Overall Financial Resource Strain (CARDIA)    Difficulty of Paying Living Expenses: Not hard at all  Food Insecurity: No Food Insecurity (01/14/2024)   Hunger Vital Sign    Worried About Running Out of Food in the Last Year: Never true    Ran Out of Food in the Last Year: Never true  Transportation Needs: No Transportation Needs (01/14/2024)   PRAPARE - Administrator, Civil Service (Medical): No    Lack of Transportation (Non-Medical): No  Physical Activity: Insufficiently Active (10/09/2023)   Exercise Vital Sign    Days of Exercise per Week: 2 days    Minutes of Exercise per Session: 60 min  Stress: No Stress Concern Present (10/09/2023)   [employer] of Occupational Health - Occupational Stress Questionnaire    Feeling of Stress : Not at all  Social Connections: Moderately Isolated (01/10/2024)   Social Connection and Isolation Panel [NHANES]    Frequency of Communication  with Friends and Family: More than three times a week    Frequency of Social Gatherings with Friends and Family: Once a week    Attends Religious Services: Never    Database administrator or Organizations: No    Attends Engineer, structural: Not on file    Marital Status: Married  Catering manager Violence: Not At Risk (01/14/2024)   Humiliation, Afraid, Rape, and Kick questionnaire    Fear of Current or Ex-Partner: No    Emotionally Abused: No    Physically Abused: No    Sexually Abused: No    Outpatient Medications Prior to Visit  Medication Sig Dispense Refill   acetaminophen  (TYLENOL ) 500 MG tablet Take 1,000 mg by mouth at bedtime.     aspirin  EC 81 MG EC tablet Take 1 tablet (81 mg total) by mouth daily. Swallow whole. 30 tablet 11   budesonide  (ENTOCORT EC ) 3 MG 24 hr capsule Take 2 capsules (6 mg total) by mouth daily. 180 capsule 3   cholecalciferol  (VITAMIN D3) 25 MCG (1000 UNIT) tablet Take 10,000 Units by mouth 2 (two) times a week. Monday and Friday     cyclobenzaprine (FLEXERIL) 5 MG tablet Take 1 tablet (5 mg total) by mouth 2 (two) times daily as needed for up to 10 doses for muscle spasms. 10 tablet 0   diazepam  (VALIUM ) 5 MG tablet TAKE 1/2 TO 2 TABLETS(2.5 TO 10 MG) BY MOUTH EVERY 12 HOURS AS NEEDED FOR  ANXIETY 30 tablet 2   diphenoxylate -atropine  (LOMOTIL ) 2.5-0.025 MG tablet Take 1 tablet by mouth 4 (four) times daily as needed for diarrhea or loose stools.     ELIQUIS  5 MG TABS tablet TAKE 1 TABLET BY MOUTH TWICE  DAILY 180 tablet 3   Evolocumab  (REPATHA  SURECLICK) 140 MG/ML SOAJ Inject 140 mg into the skin every 14 (fourteen) days. 6 mL 3   furosemide  (LASIX ) 20 MG tablet TAKE 1 TABLET BY MOUTH DAILY AS  NEEDED 90 tablet 3   latanoprost  (XALATAN ) 0.005 % ophthalmic solution Place 1 drop into the left eye at bedtime.      levalbuterol  (XOPENEX  HFA) 45 MCG/ACT inhaler Inhale 2 puffs into the lungs every 4 (four) hours as needed for wheezing or shortness of  breath. 15 g 5   magnesium  oxide (MAG-OX) 400 (240 Mg) MG tablet TAKE 1 TABLET(400 MG) BY MOUTH THREE TIMES DAILY 270 tablet 1   metoprolol  tartrate (LOPRESSOR ) 100 MG tablet TAKE 1 TABLET BY MOUTH TWICE  DAILY 180 tablet 3   Multiple Vitamins-Minerals (PRESERVISION AREDS 2) CAPS Take 1 capsule by mouth daily.     nitroGLYCERIN  (NITROSTAT ) 0.4 MG SL tablet Place 1 tablet (0.4 mg total) under the tongue every 5 (five) minutes as needed for chest pain. 30 tablet 0   omeprazole  (PRILOSEC  OTC) 20 MG tablet Take 20 mg by mouth every morning.     Ranibizumab  (LUCENTIS  IO) Inject 1 Dose into the eye every 6 (six) weeks. Bilateral eye by Dr Edyth Grana     tamsulosin  (FLOMAX ) 0.4 MG CAPS capsule Take 0.4 mg by mouth 2 (two) times daily.     traMADol  (ULTRAM ) 50 MG tablet Take 1 tablet (50 mg total) by mouth every 6 (six) hours as needed. 20 tablet 0   No facility-administered medications prior to visit.    Allergies  Allergen Reactions   Albumin  (Human) Anaphylaxis   Guaifenesin Anaphylaxis and Other (See Comments)    Stomach cramps   Polymyxin B-Trimethoprim Swelling    Eye drops made eyes swell   Pseudoephedrine Other (See Comments) and Hives    Stomach cramps   Codeine Hives, Itching, Rash and Other (See Comments)   Guaiacol Other (See Comments)    Hallucinations   Polymyxin B Other (See Comments), Rash and Swelling    Eye swelling   Statins Other (See Comments)    Muscle cramps  Muscle aches, Muscle cramps   Atorvastatin Other (See Comments)    Muscle aches and cramps  Other reaction(s): Other (See Comments)  Muscle Aches   Brimonidine  Tartrate Itching and Swelling   Gabapentin Other (See Comments)    Pt unsure of sensitivity  unk   Meloxicam Other (See Comments)    Pt unsure of sensitivity  Unknown reaction   Peppermint Flavoring Agent (Non-Screening) Other (See Comments)    Severe cramping   Pseudoephedrine-Guaifenesin Nausea And Vomiting    Stomach cramps    Pseudoephedrine-Guaifenesin Other (See Comments)    Stomach cramps   Rosuvastatin Calcium  Other (See Comments)    Muscle aches   Rosuvastatin Calcium  Other (See Comments)    Muscle aches   Simvastatin Other (See Comments)    Muscle aches and cramps  Other reaction(s): Other (See Comments)   Tapentadol Other (See Comments)    Pt unsure of sensitivity  Unknown reaction   Tramadol  Nausea Only   Ciprofloxacin  Hives, Itching, Nausea Only, Rash and Other (See Comments)   Moxifloxacin Nausea Only and Other (See Comments)  Headaches, stomach cramps  Headaches, Stomach cramps   Rofecoxib Other (See Comments)    Stomach cramping    Review of Systems  Constitutional:  Positive for malaise/fatigue. Negative for fever.  HENT:  Negative for congestion.   Eyes:  Negative for blurred vision.  Respiratory:  Negative for shortness of breath.   Cardiovascular:  Negative for chest pain, palpitations and leg swelling.  Gastrointestinal:  Positive for abdominal pain. Negative for blood in stool and nausea.  Genitourinary:  Negative for dysuria and frequency.  Musculoskeletal:  Positive for neck pain. Negative for falls.  Skin:  Negative for rash.  Neurological:  Negative for dizziness, loss of consciousness and headaches.  Endo/Heme/Allergies:  Negative for environmental allergies.  Psychiatric/Behavioral:  Negative for depression. The patient is not nervous/anxious.        Objective:    Physical Exam Vitals reviewed.  Constitutional:      Appearance: Normal appearance. He is not ill-appearing.  HENT:     Head: Normocephalic and atraumatic.     Nose: Nose normal.  Eyes:     Conjunctiva/sclera: Conjunctivae normal.  Neck:   Cardiovascular:     Rate and Rhythm: Normal rate.     Pulses: Normal pulses.     Heart sounds: Normal heart sounds. No murmur heard. Pulmonary:     Effort: Pulmonary effort is normal.     Breath sounds: Normal breath sounds. No wheezing.  Abdominal:      Palpations: Abdomen is soft. There is no mass.     Tenderness: There is no abdominal tenderness.  Musculoskeletal:     Cervical back: Normal range of motion.     Right lower leg: No edema.     Left lower leg: No edema.  Skin:    General: Skin is warm and dry.  Neurological:     General: No focal deficit present.     Mental Status: He is alert and oriented to person, place, and time.  Psychiatric:        Mood and Affect: Mood normal.     BP 128/78   Pulse (!) 52   Resp 16   Ht 5\' 4"  (1.626 m)   Wt 177 lb (80.3 kg)   SpO2 96%   BMI 30.38 kg/m  Wt Readings from Last 3 Encounters:  02/07/24 177 lb (80.3 kg)  01/31/24 183 lb 2 oz (83.1 kg)  01/26/24 174 lb (78.9 kg)    Diabetic Foot Exam - Simple   No data filed    Lab Results  Component Value Date   WBC 9.7 12/28/2023   HGB 12.1 (L) 12/28/2023   HCT 35.5 (L) 12/28/2023   PLT 192 12/28/2023   GLUCOSE 98 12/28/2023   CHOL 127 10/14/2023   TRIG 138.0 10/14/2023   HDL 51.30 10/14/2023   LDLDIRECT 134.2 12/01/2013   LDLCALC 48 10/14/2023   ALT 10 10/25/2023   AST 16 10/25/2023   NA 139 12/28/2023   K 4.6 12/28/2023   CL 105 12/28/2023   CREATININE 0.89 12/28/2023   BUN 28 (H) 12/28/2023   CO2 26 12/28/2023   TSH 1.52 10/14/2023   PSA 1.80 03/04/2021   INR 1.4 (H) 07/22/2021   HGBA1C 5.7 10/14/2023    Lab Results  Component Value Date   TSH 1.52 10/14/2023   Lab Results  Component Value Date   WBC 9.7 12/28/2023   HGB 12.1 (L) 12/28/2023   HCT 35.5 (L) 12/28/2023   MCV 91.7 12/28/2023   PLT 192 12/28/2023  Lab Results  Component Value Date   NA 139 12/28/2023   K 4.6 12/28/2023   CO2 26 12/28/2023   GLUCOSE 98 12/28/2023   BUN 28 (H) 12/28/2023   CREATININE 0.89 12/28/2023   BILITOT 0.6 10/25/2023   ALKPHOS 61 10/25/2023   AST 16 10/25/2023   ALT 10 10/25/2023   PROT 6.9 10/25/2023   ALBUMIN  4.3 10/25/2023   CALCIUM  9.4 12/28/2023   ANIONGAP 8 12/28/2023   EGFR 57 (L) 08/12/2021   GFR  79.61 10/25/2023   Lab Results  Component Value Date   CHOL 127 10/14/2023   Lab Results  Component Value Date   HDL 51.30 10/14/2023   Lab Results  Component Value Date   LDLCALC 48 10/14/2023   Lab Results  Component Value Date   TRIG 138.0 10/14/2023   Lab Results  Component Value Date   CHOLHDL 2 10/14/2023   Lab Results  Component Value Date   HGBA1C 5.7 10/14/2023       Assessment & Plan:  Chronic diastolic congestive heart failure (HCC) Assessment & Plan: No recent exacerbation. Following with cardiology  Orders: -     CBC with Differential/Platelet; Future -     Comprehensive metabolic panel with GFR; Future -     TSH; Future  Chronic obstructive pulmonary disease, unspecified COPD type (HCC) Assessment & Plan: Is using albuterol  in the morning 1 or 2 puffs depending on activity level. And he uses it prior to exercise. That seems to be working   Essential hypertension Assessment & Plan: Well controlled, no changes to meds. Encouraged heart healthy diet such as the DASH diet and exercise as tolerated.     Hyperglycemia Assessment & Plan: hgba1c acceptable, minimize simple carbs. Increase exercise as tolerated.   Orders: -     Hemoglobin A1c; Future  Hyperlipidemia, mixed Assessment & Plan: Encourage heart healthy diet such as MIND or DASH diet, increase exercise, avoid trans fats, simple carbohydrates and processed foods, consider a krill or fish or flaxseed oil cap daily.  Tolerating Repatha   Orders: -     Lipid panel; Future  Persistent atrial fibrillation (HCC) Assessment & Plan: Rate controlled and following with cardiology   Spinal stenosis in cervical region Assessment & Plan: Seen in ER with increased neck pain this past weekend after surgery last month. CT scan of neck not remarkable for any acute concerns. Given a trial of Flexeril to try.   Vitamin D  deficiency Assessment & Plan: Labs stable no new concerns, no changes    Orders: -     VITAMIN D  25 Hydroxy (Vit-D Deficiency, Fractures); Future  Myelopathy (HCC) -     Comprehensive metabolic panel with GFR; Future  Anemia, unspecified type -     Vitamin B12; Future    Assessment and Plan Assessment & Plan Postoperative pain Post-surgical neck pain managed with oxycodone  due to tramadol  intolerance. Ice packs provide relief. Hydrocodone  considered for severe pain. - Continue acetaminophen  regimen as tolerated. - Use ice packs for symptomatic relief. - Consider hydrocodone  if pain becomes unmanageable, in coordination with surgeon. - Avoid tramadol  due to gastrointestinal side effects.  Diarrhea Chronic diarrhea possibly related to Crohn's disease, managed with budesonide  and Lomotil  as needed. - Continue budesonide  as prescribed. - Use Lomotil  as needed for diarrhea control.  Anemia Chronic anemia with dietary adjustments for iron intake due to supplement intolerance. - Monitor dietary intake, including iron-rich foods like steak.       Randie Bustle, MD

## 2024-02-09 DIAGNOSIS — I5032 Chronic diastolic (congestive) heart failure: Secondary | ICD-10-CM | POA: Diagnosis not present

## 2024-02-09 DIAGNOSIS — G9589 Other specified diseases of spinal cord: Secondary | ICD-10-CM | POA: Diagnosis not present

## 2024-02-09 DIAGNOSIS — G959 Disease of spinal cord, unspecified: Secondary | ICD-10-CM | POA: Diagnosis not present

## 2024-02-09 DIAGNOSIS — M4802 Spinal stenosis, cervical region: Secondary | ICD-10-CM | POA: Diagnosis not present

## 2024-02-09 DIAGNOSIS — Z4789 Encounter for other orthopedic aftercare: Secondary | ICD-10-CM | POA: Diagnosis not present

## 2024-02-09 DIAGNOSIS — I130 Hypertensive heart and chronic kidney disease with heart failure and stage 1 through stage 4 chronic kidney disease, or unspecified chronic kidney disease: Secondary | ICD-10-CM | POA: Diagnosis not present

## 2024-02-10 ENCOUNTER — Telehealth: Payer: Self-pay | Admitting: Neurosurgery

## 2024-02-10 ENCOUNTER — Other Ambulatory Visit (INDEPENDENT_AMBULATORY_CARE_PROVIDER_SITE_OTHER)

## 2024-02-10 ENCOUNTER — Other Ambulatory Visit (HOSPITAL_BASED_OUTPATIENT_CLINIC_OR_DEPARTMENT_OTHER): Payer: Self-pay

## 2024-02-10 ENCOUNTER — Encounter: Payer: Self-pay | Admitting: Family Medicine

## 2024-02-10 DIAGNOSIS — E782 Mixed hyperlipidemia: Secondary | ICD-10-CM | POA: Diagnosis not present

## 2024-02-10 DIAGNOSIS — Z981 Arthrodesis status: Secondary | ICD-10-CM

## 2024-02-10 DIAGNOSIS — R739 Hyperglycemia, unspecified: Secondary | ICD-10-CM | POA: Diagnosis not present

## 2024-02-10 DIAGNOSIS — E559 Vitamin D deficiency, unspecified: Secondary | ICD-10-CM | POA: Diagnosis not present

## 2024-02-10 DIAGNOSIS — G959 Disease of spinal cord, unspecified: Secondary | ICD-10-CM

## 2024-02-10 DIAGNOSIS — D649 Anemia, unspecified: Secondary | ICD-10-CM | POA: Diagnosis not present

## 2024-02-10 DIAGNOSIS — I5032 Chronic diastolic (congestive) heart failure: Secondary | ICD-10-CM

## 2024-02-10 LAB — CBC WITH DIFFERENTIAL/PLATELET
Basophils Absolute: 0 10*3/uL (ref 0.0–0.1)
Basophils Relative: 0.5 % (ref 0.0–3.0)
Eosinophils Absolute: 0.4 10*3/uL (ref 0.0–0.7)
Eosinophils Relative: 5.2 % — ABNORMAL HIGH (ref 0.0–5.0)
HCT: 38.3 % — ABNORMAL LOW (ref 39.0–52.0)
Hemoglobin: 12.7 g/dL — ABNORMAL LOW (ref 13.0–17.0)
Lymphocytes Relative: 17.9 % (ref 12.0–46.0)
Lymphs Abs: 1.2 10*3/uL (ref 0.7–4.0)
MCHC: 33.3 g/dL (ref 30.0–36.0)
MCV: 92.7 fl (ref 78.0–100.0)
Monocytes Absolute: 0.6 10*3/uL (ref 0.1–1.0)
Monocytes Relative: 9.3 % (ref 3.0–12.0)
Neutro Abs: 4.5 10*3/uL (ref 1.4–7.7)
Neutrophils Relative %: 67.1 % (ref 43.0–77.0)
Platelets: 226 10*3/uL (ref 150.0–400.0)
RBC: 4.13 Mil/uL — ABNORMAL LOW (ref 4.22–5.81)
RDW: 14.9 % (ref 11.5–15.5)
WBC: 6.7 10*3/uL (ref 4.0–10.5)

## 2024-02-10 LAB — TSH: TSH: 1.74 u[IU]/mL (ref 0.35–5.50)

## 2024-02-10 LAB — COMPREHENSIVE METABOLIC PANEL WITH GFR
ALT: 9 U/L (ref 0–53)
AST: 15 U/L (ref 0–37)
Albumin: 4.4 g/dL (ref 3.5–5.2)
Alkaline Phosphatase: 74 U/L (ref 39–117)
BUN: 26 mg/dL — ABNORMAL HIGH (ref 6–23)
CO2: 26 meq/L (ref 19–32)
Calcium: 10.1 mg/dL (ref 8.4–10.5)
Chloride: 101 meq/L (ref 96–112)
Creatinine, Ser: 1.14 mg/dL (ref 0.40–1.50)
GFR: 62.22 mL/min (ref >60.00)
Glucose, Bld: 126 mg/dL — ABNORMAL HIGH (ref 70–99)
Potassium: 5.5 meq/L — ABNORMAL HIGH (ref 3.5–5.1)
Sodium: 139 meq/L (ref 135–145)
Total Bilirubin: 0.6 mg/dL (ref 0.2–1.2)
Total Protein: 7.1 g/dL (ref 6.0–8.3)

## 2024-02-10 LAB — LIPID PANEL
Cholesterol: 111 mg/dL (ref 0–200)
HDL: 40.7 mg/dL (ref >39.00)
LDL Cholesterol: 34 mg/dL (ref 0–99)
NonHDL: 70.04
Total CHOL/HDL Ratio: 3
Triglycerides: 179 mg/dL — ABNORMAL HIGH (ref 0.0–149.0)
VLDL: 35.8 mg/dL (ref 0.0–40.0)

## 2024-02-10 LAB — VITAMIN B12: Vitamin B-12: 483 pg/mL (ref 211–911)

## 2024-02-10 LAB — HEMOGLOBIN A1C: Hgb A1c MFr Bld: 5.1 % (ref 4.6–6.5)

## 2024-02-10 LAB — VITAMIN D 25 HYDROXY (VIT D DEFICIENCY, FRACTURES): VITD: 79.19 ng/mL (ref 30.00–100.00)

## 2024-02-10 MED ORDER — HYDROCODONE-ACETAMINOPHEN 5-325 MG PO TABS: 1.0000 | ORAL_TABLET | Freq: Four times a day (QID) | ORAL | 0 refills | Status: DC | PRN

## 2024-02-10 NOTE — Telephone Encounter (Signed)
 Norco (hydrocodone ) sent to pharmacy. Please let him know. PMP reviewed and is appropriate.

## 2024-02-10 NOTE — Telephone Encounter (Signed)
 C4-5 laminectomy, C4-7 posterior spinal instrumentation on 01/10/24/  He would like a refill on Oxycodone . If you can not refill it, is there something else that will help manage his pain. He can not take Tramadol  because it makes his BP go really high. [facility] [place]

## 2024-02-10 NOTE — Addendum Note (Signed)
 Addended by: Lucetta Russel on: 02/10/2024 04:37 PM   Modules accepted: Orders

## 2024-02-10 NOTE — Telephone Encounter (Signed)
 Previously he said he did not like the way oxycodone  made him feel.   Does he want to try hydrocodone  instead?

## 2024-02-11 ENCOUNTER — Other Ambulatory Visit: Payer: Self-pay

## 2024-02-11 ENCOUNTER — Encounter: Payer: Self-pay | Admitting: Neurosurgery

## 2024-02-13 ENCOUNTER — Other Ambulatory Visit: Payer: Self-pay | Admitting: Family

## 2024-02-14 ENCOUNTER — Other Ambulatory Visit: Payer: Self-pay | Admitting: Family Medicine

## 2024-02-14 ENCOUNTER — Telehealth: Payer: Self-pay

## 2024-02-14 ENCOUNTER — Other Ambulatory Visit (HOSPITAL_COMMUNITY): Payer: Self-pay

## 2024-02-14 ENCOUNTER — Other Ambulatory Visit (HOSPITAL_BASED_OUTPATIENT_CLINIC_OR_DEPARTMENT_OTHER): Payer: Self-pay

## 2024-02-14 DIAGNOSIS — G9589 Other specified diseases of spinal cord: Secondary | ICD-10-CM | POA: Diagnosis not present

## 2024-02-14 DIAGNOSIS — I130 Hypertensive heart and chronic kidney disease with heart failure and stage 1 through stage 4 chronic kidney disease, or unspecified chronic kidney disease: Secondary | ICD-10-CM | POA: Diagnosis not present

## 2024-02-14 DIAGNOSIS — Z4789 Encounter for other orthopedic aftercare: Secondary | ICD-10-CM | POA: Diagnosis not present

## 2024-02-14 DIAGNOSIS — I5032 Chronic diastolic (congestive) heart failure: Secondary | ICD-10-CM | POA: Diagnosis not present

## 2024-02-14 DIAGNOSIS — G959 Disease of spinal cord, unspecified: Secondary | ICD-10-CM | POA: Diagnosis not present

## 2024-02-14 DIAGNOSIS — M4802 Spinal stenosis, cervical region: Secondary | ICD-10-CM | POA: Diagnosis not present

## 2024-02-14 MED ORDER — SODIUM POLYSTYRENE SULFONATE PO POWD: Freq: Every day | ORAL | 1 refills | Status: DC | PRN

## 2024-02-14 NOTE — Telephone Encounter (Signed)
 Oral Oncology Patient Advocate Encounter  Was successful in securing patient a $2,500.00 grant from [facility] to provide copayment coverage for Repatha .  This will keep the out of pocket expense at $0.     [employer] ID: [id number]   The billing information is as follows and has been shared with [facility].    RxBin: [id number] PCN: [id number] Member ID: [id number] Group ID: [id number] Dates of Eligibility: 02/01/24 through 01/30/25  Fund:  Hypercholesterolemia - Medicare Access   Hansel Ley, CPhT Pharmacy Technician Coordinator [facility] [phone] (Ph) 02/14/2024 10:57 AM

## 2024-02-15 ENCOUNTER — Other Ambulatory Visit: Payer: Self-pay

## 2024-02-15 ENCOUNTER — Telehealth: Payer: Self-pay | Admitting: Family Medicine

## 2024-02-15 DIAGNOSIS — E875 Hyperkalemia: Secondary | ICD-10-CM

## 2024-02-15 NOTE — Telephone Encounter (Signed)
 Orders placed.

## 2024-02-15 NOTE — Telephone Encounter (Signed)
 Good morning I need lab orders for this pt on 5/15

## 2024-02-16 DIAGNOSIS — I5032 Chronic diastolic (congestive) heart failure: Secondary | ICD-10-CM | POA: Diagnosis not present

## 2024-02-16 DIAGNOSIS — Z8546 Personal history of malignant neoplasm of prostate: Secondary | ICD-10-CM | POA: Diagnosis not present

## 2024-02-16 DIAGNOSIS — I4510 Unspecified right bundle-branch block: Secondary | ICD-10-CM | POA: Diagnosis not present

## 2024-02-16 DIAGNOSIS — D649 Anemia, unspecified: Secondary | ICD-10-CM | POA: Diagnosis not present

## 2024-02-16 DIAGNOSIS — F419 Anxiety disorder, unspecified: Secondary | ICD-10-CM | POA: Diagnosis not present

## 2024-02-16 DIAGNOSIS — J439 Emphysema, unspecified: Secondary | ICD-10-CM | POA: Diagnosis not present

## 2024-02-16 DIAGNOSIS — M48062 Spinal stenosis, lumbar region with neurogenic claudication: Secondary | ICD-10-CM | POA: Diagnosis not present

## 2024-02-16 DIAGNOSIS — G9589 Other specified diseases of spinal cord: Secondary | ICD-10-CM | POA: Diagnosis not present

## 2024-02-16 DIAGNOSIS — H3530 Unspecified macular degeneration: Secondary | ICD-10-CM | POA: Diagnosis not present

## 2024-02-16 DIAGNOSIS — Z7951 Long term (current) use of inhaled steroids: Secondary | ICD-10-CM | POA: Diagnosis not present

## 2024-02-16 DIAGNOSIS — M180 Bilateral primary osteoarthritis of first carpometacarpal joints: Secondary | ICD-10-CM | POA: Diagnosis not present

## 2024-02-16 DIAGNOSIS — I2510 Atherosclerotic heart disease of native coronary artery without angina pectoris: Secondary | ICD-10-CM | POA: Diagnosis not present

## 2024-02-16 DIAGNOSIS — I700 Atherosclerosis of aorta: Secondary | ICD-10-CM | POA: Diagnosis not present

## 2024-02-16 DIAGNOSIS — Z7901 Long term (current) use of anticoagulants: Secondary | ICD-10-CM | POA: Diagnosis not present

## 2024-02-16 DIAGNOSIS — I130 Hypertensive heart and chronic kidney disease with heart failure and stage 1 through stage 4 chronic kidney disease, or unspecified chronic kidney disease: Secondary | ICD-10-CM | POA: Diagnosis not present

## 2024-02-16 DIAGNOSIS — G959 Disease of spinal cord, unspecified: Secondary | ICD-10-CM | POA: Diagnosis not present

## 2024-02-16 DIAGNOSIS — N401 Enlarged prostate with lower urinary tract symptoms: Secondary | ICD-10-CM | POA: Diagnosis not present

## 2024-02-16 DIAGNOSIS — I480 Paroxysmal atrial fibrillation: Secondary | ICD-10-CM | POA: Diagnosis not present

## 2024-02-16 DIAGNOSIS — N183 Chronic kidney disease, stage 3 unspecified: Secondary | ICD-10-CM | POA: Diagnosis not present

## 2024-02-16 DIAGNOSIS — Z4789 Encounter for other orthopedic aftercare: Secondary | ICD-10-CM | POA: Diagnosis not present

## 2024-02-16 DIAGNOSIS — M4802 Spinal stenosis, cervical region: Secondary | ICD-10-CM | POA: Diagnosis not present

## 2024-02-16 DIAGNOSIS — Z981 Arthrodesis status: Secondary | ICD-10-CM | POA: Diagnosis not present

## 2024-02-16 DIAGNOSIS — Z85038 Personal history of other malignant neoplasm of large intestine: Secondary | ICD-10-CM | POA: Diagnosis not present

## 2024-02-17 ENCOUNTER — Other Ambulatory Visit (INDEPENDENT_AMBULATORY_CARE_PROVIDER_SITE_OTHER)

## 2024-02-17 DIAGNOSIS — E875 Hyperkalemia: Secondary | ICD-10-CM | POA: Diagnosis not present

## 2024-02-17 LAB — COMPREHENSIVE METABOLIC PANEL WITH GFR
ALT: 9 U/L (ref 0–53)
AST: 16 U/L (ref 0–37)
Albumin: 4.4 g/dL (ref 3.5–5.2)
Alkaline Phosphatase: 72 U/L (ref 39–117)
BUN: 26 mg/dL — ABNORMAL HIGH (ref 6–23)
CO2: 26 meq/L (ref 19–32)
Calcium: 10.2 mg/dL (ref 8.4–10.5)
Chloride: 100 meq/L (ref 96–112)
Creatinine, Ser: 1.03 mg/dL (ref 0.40–1.50)
GFR: 70.27 mL/min (ref >60.00)
Glucose, Bld: 95 mg/dL (ref 70–99)
Potassium: 5.3 meq/L — ABNORMAL HIGH (ref 3.5–5.1)
Sodium: 136 meq/L (ref 135–145)
Total Bilirubin: 0.6 mg/dL (ref 0.2–1.2)
Total Protein: 7.2 g/dL (ref 6.0–8.3)

## 2024-02-20 ENCOUNTER — Ambulatory Visit: Payer: Self-pay | Admitting: Family Medicine

## 2024-02-21 ENCOUNTER — Other Ambulatory Visit: Payer: Self-pay

## 2024-02-21 DIAGNOSIS — G959 Disease of spinal cord, unspecified: Secondary | ICD-10-CM | POA: Diagnosis not present

## 2024-02-21 DIAGNOSIS — M4802 Spinal stenosis, cervical region: Secondary | ICD-10-CM | POA: Diagnosis not present

## 2024-02-21 DIAGNOSIS — Z4789 Encounter for other orthopedic aftercare: Secondary | ICD-10-CM | POA: Diagnosis not present

## 2024-02-21 DIAGNOSIS — I130 Hypertensive heart and chronic kidney disease with heart failure and stage 1 through stage 4 chronic kidney disease, or unspecified chronic kidney disease: Secondary | ICD-10-CM | POA: Diagnosis not present

## 2024-02-21 DIAGNOSIS — G9589 Other specified diseases of spinal cord: Secondary | ICD-10-CM | POA: Diagnosis not present

## 2024-02-21 DIAGNOSIS — I5032 Chronic diastolic (congestive) heart failure: Secondary | ICD-10-CM | POA: Diagnosis not present

## 2024-02-22 ENCOUNTER — Ambulatory Visit (INDEPENDENT_AMBULATORY_CARE_PROVIDER_SITE_OTHER): Payer: Medicare Other | Admitting: Neurosurgery

## 2024-02-22 ENCOUNTER — Ambulatory Visit
Admission: RE | Admit: 2024-02-22 | Discharge: 2024-02-22 | Disposition: A | Discharge status: Home / Self Care | Source: Ambulatory Visit | Attending: Neurosurgery | Admitting: Neurosurgery

## 2024-02-22 ENCOUNTER — Ambulatory Visit
Admission: RE | Admit: 2024-02-22 | Discharge: 2024-02-22 | Disposition: A | Discharge status: Home / Self Care | Attending: Neurosurgery | Admitting: Neurosurgery

## 2024-02-22 DIAGNOSIS — Z981 Arthrodesis status: Secondary | ICD-10-CM

## 2024-02-22 DIAGNOSIS — G959 Disease of spinal cord, unspecified: Secondary | ICD-10-CM | POA: Diagnosis not present

## 2024-02-22 DIAGNOSIS — M47812 Spondylosis without myelopathy or radiculopathy, cervical region: Secondary | ICD-10-CM | POA: Diagnosis not present

## 2024-02-22 DIAGNOSIS — S12400A Unspecified displaced fracture of fifth cervical vertebra, initial encounter for closed fracture: Secondary | ICD-10-CM | POA: Diagnosis not present

## 2024-02-22 MED ORDER — HYDROCODONE-ACETAMINOPHEN 5-325 MG PO TABS: 1.0000 | ORAL_TABLET | Freq: Four times a day (QID) | ORAL | 0 refills | Status: AC | PRN

## 2024-02-22 NOTE — Progress Notes (Signed)
   REFERRING PHYSICIAN:  Neda Balk, Md [address] [place],  [place] [address]  DOS: 01/10/2024, C4-5 posterior decompression with C4-7 instrumentation.   HISTORY OF PRESENT ILLNESS: Ryan [name] is status post C4-5 posterior decompression with C4-7 instrumentation.   He is having some pain that he controls with occasional hydrocodone  as well as Tylenol .  Overall, he is doing well.    PHYSICAL EXAMINATION:  NEUROLOGICAL:  General: In no acute distress.   Awake, alert, oriented to person, place, and time.  Pupils equal round and reactive to light.     Strength: Side Biceps Triceps Deltoid Interossei Grip Wrist Ext. Wrist Flex.  R 5 5 5 5 5 5 5   L 5 5 5 5 5 5 5    Incision c/d/I.  Staples have been removed.  Minimal erythema surrounding the incision site without any drainage.  Patient still has skin tears in left upper extremity.  These are currently covered with a bandage.  Imaging:  No complications noted  Assessment / Plan: Ryan [name] is status post C4-5 posterior decompression with C4-7 instrumentation.   He is doing well.  He has made significant progress.  He will continue working with physical therapy.  We will see him back in 6 weeks.  Activity restrictions reviewed.  Jodeen Munch MD Dept of Neurosurgery

## 2024-02-24 DIAGNOSIS — G959 Disease of spinal cord, unspecified: Secondary | ICD-10-CM | POA: Diagnosis not present

## 2024-02-24 DIAGNOSIS — Z4789 Encounter for other orthopedic aftercare: Secondary | ICD-10-CM | POA: Diagnosis not present

## 2024-02-24 DIAGNOSIS — I5032 Chronic diastolic (congestive) heart failure: Secondary | ICD-10-CM | POA: Diagnosis not present

## 2024-02-24 DIAGNOSIS — G9589 Other specified diseases of spinal cord: Secondary | ICD-10-CM | POA: Diagnosis not present

## 2024-02-24 DIAGNOSIS — I130 Hypertensive heart and chronic kidney disease with heart failure and stage 1 through stage 4 chronic kidney disease, or unspecified chronic kidney disease: Secondary | ICD-10-CM | POA: Diagnosis not present

## 2024-02-24 DIAGNOSIS — M4802 Spinal stenosis, cervical region: Secondary | ICD-10-CM | POA: Diagnosis not present

## 2024-02-26 ENCOUNTER — Other Ambulatory Visit: Payer: Self-pay | Admitting: Internal Medicine

## 2024-03-01 ENCOUNTER — Telehealth: Payer: Self-pay | Admitting: Family Medicine

## 2024-03-01 DIAGNOSIS — Z4789 Encounter for other orthopedic aftercare: Secondary | ICD-10-CM | POA: Diagnosis not present

## 2024-03-01 DIAGNOSIS — M4802 Spinal stenosis, cervical region: Secondary | ICD-10-CM | POA: Diagnosis not present

## 2024-03-01 DIAGNOSIS — I130 Hypertensive heart and chronic kidney disease with heart failure and stage 1 through stage 4 chronic kidney disease, or unspecified chronic kidney disease: Secondary | ICD-10-CM | POA: Diagnosis not present

## 2024-03-01 DIAGNOSIS — G959 Disease of spinal cord, unspecified: Secondary | ICD-10-CM | POA: Diagnosis not present

## 2024-03-01 DIAGNOSIS — I5032 Chronic diastolic (congestive) heart failure: Secondary | ICD-10-CM | POA: Diagnosis not present

## 2024-03-01 DIAGNOSIS — G9589 Other specified diseases of spinal cord: Secondary | ICD-10-CM | POA: Diagnosis not present

## 2024-03-01 NOTE — Telephone Encounter (Signed)
 Copied from CRM [id number]. Topic: General - Other >> Mar 01, 2024  1:48 PM [name] wrote: Reason for CRM: Patient returned call to speak with [name]. Reached CAL. Advised patient that Ryan [name] is at lunch and I will leave message for her to return call to him. Patient may be reached at [phone]. Patient requested an appointment for labs as well.

## 2024-03-01 NOTE — Telephone Encounter (Signed)
 Returned patient's call and he wanted Dr. Rodrick Clapper to know that he started taking the Kayexalate  medication on 02/17/24 and scheduled a lab visit to have his potassium rechecked. Just an FYI

## 2024-03-02 DIAGNOSIS — G9589 Other specified diseases of spinal cord: Secondary | ICD-10-CM | POA: Diagnosis not present

## 2024-03-02 DIAGNOSIS — I130 Hypertensive heart and chronic kidney disease with heart failure and stage 1 through stage 4 chronic kidney disease, or unspecified chronic kidney disease: Secondary | ICD-10-CM | POA: Diagnosis not present

## 2024-03-02 DIAGNOSIS — I5032 Chronic diastolic (congestive) heart failure: Secondary | ICD-10-CM | POA: Diagnosis not present

## 2024-03-02 DIAGNOSIS — M4802 Spinal stenosis, cervical region: Secondary | ICD-10-CM | POA: Diagnosis not present

## 2024-03-02 DIAGNOSIS — G959 Disease of spinal cord, unspecified: Secondary | ICD-10-CM | POA: Diagnosis not present

## 2024-03-02 DIAGNOSIS — Z4789 Encounter for other orthopedic aftercare: Secondary | ICD-10-CM | POA: Diagnosis not present

## 2024-03-07 ENCOUNTER — Other Ambulatory Visit: Payer: Self-pay

## 2024-03-07 ENCOUNTER — Other Ambulatory Visit (INDEPENDENT_AMBULATORY_CARE_PROVIDER_SITE_OTHER)

## 2024-03-07 ENCOUNTER — Ambulatory Visit: Payer: Self-pay | Admitting: Family Medicine

## 2024-03-07 ENCOUNTER — Telehealth: Payer: Self-pay | Admitting: Family Medicine

## 2024-03-07 DIAGNOSIS — E875 Hyperkalemia: Secondary | ICD-10-CM | POA: Diagnosis not present

## 2024-03-07 LAB — COMPREHENSIVE METABOLIC PANEL WITH GFR
ALT: 8 U/L (ref 0–53)
AST: 16 U/L (ref 0–37)
Albumin: 4.1 g/dL (ref 3.5–5.2)
Alkaline Phosphatase: 59 U/L (ref 39–117)
BUN: 22 mg/dL (ref 6–23)
CO2: 29 meq/L (ref 19–32)
Calcium: 9.7 mg/dL (ref 8.4–10.5)
Chloride: 99 meq/L (ref 96–112)
Creatinine, Ser: 1.06 mg/dL (ref 0.40–1.50)
GFR: 67.87 mL/min (ref >60.00)
Glucose, Bld: 90 mg/dL (ref 70–99)
Potassium: 5.2 meq/L — ABNORMAL HIGH (ref 3.5–5.1)
Sodium: 137 meq/L (ref 135–145)
Total Bilirubin: 0.7 mg/dL (ref 0.2–1.2)
Total Protein: 6.8 g/dL (ref 6.0–8.3)

## 2024-03-07 NOTE — Telephone Encounter (Signed)
 Good morning just need lab orders for this pt

## 2024-03-07 NOTE — Telephone Encounter (Signed)
CMP order was placed.

## 2024-03-08 DIAGNOSIS — I5032 Chronic diastolic (congestive) heart failure: Secondary | ICD-10-CM | POA: Diagnosis not present

## 2024-03-08 DIAGNOSIS — G959 Disease of spinal cord, unspecified: Secondary | ICD-10-CM | POA: Diagnosis not present

## 2024-03-08 DIAGNOSIS — I130 Hypertensive heart and chronic kidney disease with heart failure and stage 1 through stage 4 chronic kidney disease, or unspecified chronic kidney disease: Secondary | ICD-10-CM | POA: Diagnosis not present

## 2024-03-08 DIAGNOSIS — Z4789 Encounter for other orthopedic aftercare: Secondary | ICD-10-CM | POA: Diagnosis not present

## 2024-03-08 DIAGNOSIS — M4802 Spinal stenosis, cervical region: Secondary | ICD-10-CM | POA: Diagnosis not present

## 2024-03-08 DIAGNOSIS — G9589 Other specified diseases of spinal cord: Secondary | ICD-10-CM | POA: Diagnosis not present

## 2024-03-09 DIAGNOSIS — Z4789 Encounter for other orthopedic aftercare: Secondary | ICD-10-CM | POA: Diagnosis not present

## 2024-03-09 DIAGNOSIS — M4802 Spinal stenosis, cervical region: Secondary | ICD-10-CM | POA: Diagnosis not present

## 2024-03-09 DIAGNOSIS — I130 Hypertensive heart and chronic kidney disease with heart failure and stage 1 through stage 4 chronic kidney disease, or unspecified chronic kidney disease: Secondary | ICD-10-CM | POA: Diagnosis not present

## 2024-03-09 DIAGNOSIS — I5032 Chronic diastolic (congestive) heart failure: Secondary | ICD-10-CM | POA: Diagnosis not present

## 2024-03-09 DIAGNOSIS — G9589 Other specified diseases of spinal cord: Secondary | ICD-10-CM | POA: Diagnosis not present

## 2024-03-09 DIAGNOSIS — G959 Disease of spinal cord, unspecified: Secondary | ICD-10-CM | POA: Diagnosis not present

## 2024-03-13 ENCOUNTER — Emergency Department (HOSPITAL_BASED_OUTPATIENT_CLINIC_OR_DEPARTMENT_OTHER)
Admission: EM | Admit: 2024-03-13 | Discharge: 2024-03-13 | Disposition: A | Discharge status: Home / Self Care | Attending: Emergency Medicine | Admitting: Emergency Medicine

## 2024-03-13 ENCOUNTER — Other Ambulatory Visit: Payer: Self-pay

## 2024-03-13 ENCOUNTER — Encounter (HOSPITAL_BASED_OUTPATIENT_CLINIC_OR_DEPARTMENT_OTHER): Payer: Self-pay | Admitting: Emergency Medicine

## 2024-03-13 DIAGNOSIS — J449 Chronic obstructive pulmonary disease, unspecified: Secondary | ICD-10-CM | POA: Insufficient documentation

## 2024-03-13 DIAGNOSIS — W57XXXA Bitten or stung by nonvenomous insect and other nonvenomous arthropods, initial encounter: Secondary | ICD-10-CM | POA: Insufficient documentation

## 2024-03-13 DIAGNOSIS — I509 Heart failure, unspecified: Secondary | ICD-10-CM | POA: Insufficient documentation

## 2024-03-13 DIAGNOSIS — S50362A Insect bite (nonvenomous) of left elbow, initial encounter: Secondary | ICD-10-CM | POA: Diagnosis not present

## 2024-03-13 DIAGNOSIS — Z7982 Long term (current) use of aspirin: Secondary | ICD-10-CM | POA: Diagnosis not present

## 2024-03-13 DIAGNOSIS — Z7901 Long term (current) use of anticoagulants: Secondary | ICD-10-CM | POA: Insufficient documentation

## 2024-03-13 MED ORDER — DOXYCYCLINE HYCLATE 100 MG PO CAPS: 100.0000 mg | ORAL_CAPSULE | Freq: Two times a day (BID) | ORAL | 0 refills | Status: DC

## 2024-03-13 MED ORDER — DOXYCYCLINE HYCLATE 100 MG PO TABS
100.0000 mg | ORAL_TABLET | Freq: Once | ORAL | Status: AC
  Administered 2024-03-13: 100 mg via ORAL
  Filled 2024-03-13: qty 1

## 2024-03-13 NOTE — Discharge Instructions (Signed)
 Take doxycycline  twice a day for the next 10 days.  Take this medication with food as it can cause nausea and vomiting if taken on an empty stomach.  You were given the first dose in the ED tonight.  You can apply warm compresses to the bite site to help with the discomfort and swelling.  Follow-up with your primary care fighter the next week for reevaluation of symptoms.  Get help right away if: You cannot remove a tick. You cannot move (have paralysis) or feel weak. You are feeling worse or have new symptoms. You find a tick that is biting you and filled with blood, especially if you are in an area where diseases from ticks are common.

## 2024-03-13 NOTE — ED Triage Notes (Signed)
 Tick bite to left upper arm , obvious swelling and redness . Pt reports he brought the tick with him .

## 2024-03-13 NOTE — ED Provider Notes (Signed)
 [facility] Provider Note   CSN: [id number] Arrival date & time: 03/13/24  1807     History  Chief Complaint  Patient presents with   Tick Removal    Ryan [name] is a 78 y.o. male with a history of atrial fibrillation, CHF, and COPD who presents to the ED today for tick bite.  Patient reports last night he found a tick on his left arm.  He used tweezers and remove the tick and brought it in today.  States that he has been putting hydrocortisone  cream and alcohol on the area where he was bitten.  States that today the site became red and feels as if there is a bump where he was bit.  Patient is unsure whether or not he removed the head of the tick completely.  Denies any fevers, warmth to touch at the site, or drainage.  He maintains full range of motion of his arm.    Home Medications Prior to Admission medications   Medication Sig Start Date End Date Taking? Authorizing Provider  doxycycline  (VIBRAMYCIN ) 100 MG capsule Take 1 capsule (100 mg total) by mouth 2 (two) times daily for 10 days. 03/13/24 03/23/24 Yes Sonnie Dusky, PA-C  acetaminophen  (TYLENOL ) 500 MG tablet Take 1,000 mg by mouth at bedtime.    [provider]  aspirin  EC 81 MG EC tablet Take 1 tablet (81 mg total) by mouth daily. Swallow whole. 08/01/21   Roddenberry, Myron G, PA-C  budesonide  (ENTOCORT EC ) 3 MG 24 hr capsule Take 2 capsules (6 mg total) by mouth daily. 01/31/24   Cirigliano, Vito V, DO  cholecalciferol  (VITAMIN D3) 25 MCG (1000 UNIT) tablet Take 10,000 Units by mouth 2 (two) times a week. Monday and Friday    [provider]  cyclobenzaprine  (FLEXERIL ) 5 MG tablet Take 1 tablet (5 mg total) by mouth 2 (two) times daily as needed for up to 10 doses for muscle spasms. 02/05/24   Curatolo, Adam, DO  diazepam  (VALIUM ) 5 MG tablet TAKE 1/2 TO 2 TABLETS(2.5 TO 10 MG) BY MOUTH EVERY 12 HOURS AS NEEDED FOR ANXIETY 06/28/23   Neda Balk, MD   diphenoxylate -atropine  (LOMOTIL ) 2.5-0.025 MG tablet Take 1 tablet by mouth 4 (four) times daily as needed for diarrhea or loose stools.    [provider]  ELIQUIS  5 MG TABS tablet TAKE 1 TABLET BY MOUTH TWICE  DAILY 11/22/23   Tammie Fall, MD  Evolocumab  (REPATHA  SURECLICK) 140 MG/ML SOAJ Inject 140 mg into the skin every 14 (fourteen) days. 03/10/23   Tammie Fall, MD  furosemide  (LASIX ) 20 MG tablet TAKE 1 TABLET BY MOUTH DAILY AS  NEEDED 12/22/21   Neda Balk, MD  latanoprost  (XALATAN ) 0.005 % ophthalmic solution Place 1 drop into the left eye at bedtime.     [provider]  levalbuterol  (XOPENEX  HFA) 45 MCG/ACT inhaler Inhale 2 puffs into the lungs every 4 (four) hours as needed for wheezing or shortness of breath. 08/02/23   Neda Balk, MD  magnesium  oxide (MAG-OX) 400 (240 Mg) MG tablet TAKE 1 TABLET(400 MG) BY MOUTH THREE TIMES DAILY 11/29/23   Neda Balk, MD  metoprolol  tartrate (LOPRESSOR ) 100 MG tablet TAKE 1 TABLET BY MOUTH TWICE  DAILY 02/29/24   Tammie Fall, MD  Multiple Vitamins-Minerals (PRESERVISION AREDS 2) CAPS Take 1 capsule by mouth daily.    [provider]  nitroGLYCERIN  (NITROSTAT ) 0.4 MG SL tablet Place  1 tablet (0.4 mg total) under the tongue every 5 (five) minutes as needed for chest pain. 04/14/20   Claretta Croft, MD  omeprazole  (PRILOSEC  OTC) 20 MG tablet Take 20 mg by mouth every morning.    [provider]  Ranibizumab  (LUCENTIS  IO) Inject 1 Dose into the eye every 6 (six) weeks. Bilateral eye by Dr Edyth Grana    [provider]  sodium polystyrene (KAYEXALATE ) powder Take by mouth daily as needed (hyperkalemia). Patient not taking: Reported on 02/22/2024 02/14/24   Neda Balk, MD  tamsulosin  (FLOMAX ) 0.4 MG CAPS capsule Take 0.4 mg by mouth 2 (two) times daily.    [provider]      Allergies    Albumin  (human), Guaifenesin, Polymyxin b-trimethoprim, Pseudoephedrine, Codeine,  Guaiacol, Polymyxin b, Statins, Atorvastatin, Brimonidine  tartrate, Gabapentin, Meloxicam, Peppermint flavoring agent (non-screening), Pseudoephedrine-guaifenesin, Pseudoephedrine-guaifenesin, Rosuvastatin calcium , Rosuvastatin calcium , Simvastatin, Tapentadol, Tramadol , Ciprofloxacin , Moxifloxacin, and Rofecoxib    Review of Systems   Review of Systems  Skin:  Positive for wound.  All other systems reviewed and are negative.   Physical Exam Updated Vital Signs BP (!) 154/65   Pulse 86   Temp 98.4 F (36.9 C) (Oral)   Resp 16   Wt 80 kg   SpO2 94%   BMI 30.27 kg/m  Physical Exam Vitals and nursing note reviewed.  Constitutional:      General: He is not in acute distress.    Appearance: Normal appearance.  HENT:     Head: Normocephalic and atraumatic.     Mouth/Throat:     Mouth: Mucous membranes are moist.  Eyes:     Conjunctiva/sclera: Conjunctivae normal.     Pupils: Pupils are equal, round, and reactive to light.  Cardiovascular:     Rate and Rhythm: Normal rate and regular rhythm.     Pulses: Normal pulses.  Pulmonary:     Effort: Pulmonary effort is normal.     Breath sounds: Normal breath sounds.  Abdominal:     Palpations: Abdomen is soft.     Tenderness: There is no abdominal tenderness.  Musculoskeletal:        General: No tenderness. Normal range of motion.     Cervical back: Normal range of motion.     Comments: No tenderness to touch at the site of tick bite. He maintains full ROM of the arm and is neurovascularly intact.  Skin:    General: Skin is warm and dry.     Findings: Erythema present.     Comments: Erythema and lesion from tick bite at the volar left medial antecubital area. No retained tick at the wound site.  Neurological:     General: No focal deficit present.     Mental Status: He is alert.  Psychiatric:        Mood and Affect: Mood normal.        Behavior: Behavior normal.     ED Results / Procedures / Treatments   Labs (all labs  ordered are listed, but only abnormal results are displayed) Labs Reviewed - No data to display  EKG None  Radiology No results found.  Procedures Procedures    Medications Ordered in ED Medications  doxycycline  (VIBRA -TABS) tablet 100 mg (100 mg Oral Given 03/13/24 1858)    ED Course/ Medical Decision Making/ A&P  Medical Decision Making Risk Prescription drug management.   This patient presents to the ED for concern of tick bite, this involves an extensive number of treatment options, and is a complaint that carries with it a high risk of complications and morbidity.    Comorbidities  See HPI above   Additional History  Additional history obtained from prior records   Problem List / ED Course / Critical Interventions / Medication Management  Patient found a tick on his inner left arm just below the elbow last night after coming home from a friend's house. He removed the tick and brought it with him today. He has been putting alcohol and hydrocortisone  cream on the bite site but he states that it feels like there is some swelling now as well as redness that was not there initially after he found the tick. No fevers, drainage from the wound today, warmth to touch. I ordered medications including: Doxycycline  for prophylaxis  First dose antibiotics given in ED tonight.  Prescription sent to his pharmacy to continue.    Social Determinants of Health  Housing    Test / Admission - Considered  Patient is stable and safe for discharge home. Return precautions given.       Final Clinical Impression(s) / ED Diagnoses Final diagnoses:  Tick bite of left elbow, initial encounter    Rx / DC Orders ED Discharge Orders          Ordered    doxycycline  (VIBRAMYCIN ) 100 MG capsule  2 times daily        03/13/24 1854              Sonnie Dusky, PA-C 03/13/24 1936    Sallyanne Creamer, DO 03/16/24 2232

## 2024-03-15 DIAGNOSIS — I130 Hypertensive heart and chronic kidney disease with heart failure and stage 1 through stage 4 chronic kidney disease, or unspecified chronic kidney disease: Secondary | ICD-10-CM | POA: Diagnosis not present

## 2024-03-15 DIAGNOSIS — I5032 Chronic diastolic (congestive) heart failure: Secondary | ICD-10-CM | POA: Diagnosis not present

## 2024-03-15 DIAGNOSIS — Z4789 Encounter for other orthopedic aftercare: Secondary | ICD-10-CM | POA: Diagnosis not present

## 2024-03-15 DIAGNOSIS — M4802 Spinal stenosis, cervical region: Secondary | ICD-10-CM | POA: Diagnosis not present

## 2024-03-15 DIAGNOSIS — G9589 Other specified diseases of spinal cord: Secondary | ICD-10-CM | POA: Diagnosis not present

## 2024-03-15 DIAGNOSIS — G959 Disease of spinal cord, unspecified: Secondary | ICD-10-CM | POA: Diagnosis not present

## 2024-03-21 ENCOUNTER — Ambulatory Visit: Payer: Self-pay

## 2024-03-21 NOTE — Telephone Encounter (Signed)
 FYI Only or Action Required?: Action required by provider  Patient was last seen in primary care on 02/07/2024 by Neda Balk, MD. Called Nurse Triage reporting Medication Reaction. Symptoms began after starting Doxycycline  - diarrhea, chest discomfort.I can't take this medicine anymore.several days ago. Interventions attempted: Nothing. Symptoms are: unchanged.  Triage Disposition: Call PCP Now  Patient/caregiver understands and will follow disposition?: Yes Pt. Requesting different antibiotic for tick bite.     Copied from CRM [id number]. Topic: Clinical - Medication Question >> Mar 21, 2024  1:49 PM Aisha D wrote: Reason for CRM:  Patient called and said that he is having side affect from doxycycline  (VIBRAMYCIN ) 100 MG capsule and would like a different medication for tick bite  UPDATE: Pt has a missed call from nurse Burdette Carolin advising him to callback regarding this concern. ----------------------------------------------------------------------- From previous Reason for Contact - Red Word Triage: Red Word that prompted transfer to Nurse Triage: Reason for Disposition  [1] Caller has URGENT medicine question about med that PCP or specialist prescribed AND [2] triager unable to answer question  Answer Assessment - Initial Assessment Questions 1. NAME of MEDICINE: What medicine(s) are you calling about?     Doxycycline  2. QUESTION: What is your question? (e.g., double dose of medicine, side effect)     Causing diarrhea, chest pain after taking it 3. PRESCRIBER: Who prescribed the medicine? Reason: if prescribed by specialist, call should be referred to that group.      4. SYMPTOMS: Do you have any symptoms? If Yes, ask: What symptoms are you having?  How bad are the symptoms (e.g., mild, moderate, severe)     yes 5. PREGNANCY:  Is there any chance that you are pregnant? When was your last menstrual period?     N/a  Protocols used: Medication Question Call-A-AH

## 2024-03-21 NOTE — Telephone Encounter (Signed)
 Copied from CRM [id number]. Topic: Clinical - Medication Question >> Mar 21, 2024  1:06 PM [place] A wrote: Reason for CRM: Patient called and said that he is having side affect from doxycycline  (VIBRAMYCIN ) 100 MG capsule and would like a different medication for tick bite

## 2024-03-21 NOTE — Telephone Encounter (Signed)
 Pt states that he just talked to an RN with [facility]. Pt was triaged by another RN. Pt states that he is worried about his potassium levels and he is not sure with the sulfur I take every morning, it might not go any lower. Pt also would like PCP to write for the smallest pill of amoxicillin , he does not want the gigantic horse pill the dentist always gives me. See other triage notes.

## 2024-03-22 ENCOUNTER — Encounter: Payer: Self-pay | Admitting: Family Medicine

## 2024-03-22 ENCOUNTER — Ambulatory Visit: Payer: Self-pay | Admitting: Family Medicine

## 2024-03-22 ENCOUNTER — Other Ambulatory Visit (INDEPENDENT_AMBULATORY_CARE_PROVIDER_SITE_OTHER)

## 2024-03-22 ENCOUNTER — Ambulatory Visit (INDEPENDENT_AMBULATORY_CARE_PROVIDER_SITE_OTHER): Admitting: Family Medicine

## 2024-03-22 VITALS — BP 128/89 | HR 75 | Temp 98.0°F | Resp 16 | Ht 64.0 in | Wt 176.0 lb

## 2024-03-22 DIAGNOSIS — W57XXXA Bitten or stung by nonvenomous insect and other nonvenomous arthropods, initial encounter: Secondary | ICD-10-CM | POA: Diagnosis not present

## 2024-03-22 DIAGNOSIS — S50862A Insect bite (nonvenomous) of left forearm, initial encounter: Secondary | ICD-10-CM

## 2024-03-22 DIAGNOSIS — E875 Hyperkalemia: Secondary | ICD-10-CM | POA: Diagnosis not present

## 2024-03-22 DIAGNOSIS — T50905A Adverse effect of unspecified drugs, medicaments and biological substances, initial encounter: Secondary | ICD-10-CM | POA: Diagnosis not present

## 2024-03-22 LAB — COMPREHENSIVE METABOLIC PANEL WITH GFR
ALT: 15 U/L (ref 0–53)
AST: 24 U/L (ref 0–37)
Albumin: 3.9 g/dL (ref 3.5–5.2)
Alkaline Phosphatase: 68 U/L (ref 39–117)
BUN: 20 mg/dL (ref 6–23)
CO2: 28 meq/L (ref 19–32)
Calcium: 7.8 mg/dL — ABNORMAL LOW (ref 8.4–10.5)
Chloride: 102 meq/L (ref 96–112)
Creatinine, Ser: 0.82 mg/dL (ref 0.40–1.50)
GFR: 84.92 mL/min (ref >60.00)
Glucose, Bld: 98 mg/dL (ref 70–99)
Potassium: 4 meq/L (ref 3.5–5.1)
Sodium: 139 meq/L (ref 135–145)
Total Bilirubin: 0.7 mg/dL (ref 0.2–1.2)
Total Protein: 6.4 g/dL (ref 6.0–8.3)

## 2024-03-22 NOTE — Telephone Encounter (Signed)
 Do you want pt to come into office to be reassessed again for bite and medication , ED visit was on 03/13/24

## 2024-03-22 NOTE — Patient Instructions (Signed)
Give [place] 2-3 business days to get the results of your labs back.   Ice/cold pack over area for 10-15 min twice daily.  Let us know if you need anything.

## 2024-03-22 NOTE — Progress Notes (Signed)
 Chief Complaint  Patient presents with   Follow-up    Follow up Tick Bite    Ryan [name] is a 79 y.o. male here for a skin complaint.  Duration: 1 week Location: elbow Pruritic? No Painful? No Drainage? No New soaps/lotions/topicals/detergents? 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; Gleason 5+5; PSA 1.8 --> Tx'd with XRT (12/10/21 - 02/18/22) + LT-ADT; XRT course truncated due to severe radiation colitis   Allergy    Anemia    Anxiety    a.) on BZO PRN (diazepam )   Aortic atherosclerosis (HCC)    Bilateral carotid artery disease (HCC) 03/24/2021   a.) carotid doppler 03/24/2021: 1-39% BICA   BPH with urinary obstruction    CAD (coronary artery disease) 03/26/2011   a.) cCTA 03/26/2011: Ca2+ 505 (pRCA + LM); b.) LHC 04/01/2011: 40-50% oLM, 60-70% m-dLAD, 30% mD2, 60% pRCA - med mgmt; c.) LHC 10/17/2022L 90% oLM, 60% o-pLCx, 95% pRCA, 70% dRCA, 70% RPDA --> CVTS consult; d.) s/p 4v CABG 07/22/2021   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   Chronic diastolic CHF (congestive heart failure) (HCC) 01/14/2017   a.) TTE 01/14/2017: TTE 01/14/2017: EF 55-60%, sev LA dil, triv MR, G1DD; b.) TTE 03/23/2018: EF 55-60%, mod LVH, sev LA dil, mod-sev RA dil; c.) TTE 04/17/2020: EF 60-65%, mod asym ant seg LVH, mild LA dil, mild MR, G1DD; d.) TTE 03/25/2021: EF 50-55%, AoV sclerosis, triv MR   CKD (chronic kidney disease), stage III (HCC)    COPD (chronic obstructive pulmonary disease) (HCC)    DDD (degenerative disc disease), cervical    a.) s/p ACDF C5-C7 06/08/2002   DOE (dyspnea on exertion)    Eczema    Emphysema of  lung (HCC)    Family history of adverse reaction to anesthesia    a.) PONV and postoperative delirium/agitation in 1st degree relatives (sisters)   GERD (gastroesophageal reflux disease)    Glaucoma, left eye    Heart murmur    Hemorrhoids    History of adenomatous polyp of colon    History of bilateral cataract extraction 2021   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 Winn Havens--- bilateral eye injection every 6 wks   NSVT (nonsustained ventricular tachycardia) (HCC) 03/17/2022   a.) Zio patch 03/17/2022: 3 runs with fastest lasting 10 beats at rate of 190  bpm and longest lasting 16 beats at rate of 121 bpm   On apixaban  therapy    Osteoporosis 05/31/2016   PAF (paroxysmal atrial fibrillation) (HCC) 03/2012   a.) CHA2DS2VASc = 7 (age x2, CHF, HTN, TIA x2, vascular disease history);  b.) s/p DCCV 04/25/2018 (150J x 1 --> SB); c.) rate/rhythm maintained on oral metoprolol  tartrate; chronically anticoagulated with apixaban    Postoperative ileus (HCC) 07/2021   a.) following CABG procedure   RBBB (right bundle branch block)    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 No accessory muscle use Skin: indurated circular region w central excoriation over medial anterior prox forearm. No drainage, erythema, TTP, fluctuance Psych: Age appropriate judgment and insight  Tick bite of left forearm, initial encounter - Plan: B. burgdorfi antibodies  Adverse effect of drug/medicinal, initial encounter  Ck above at pt's request. Hold on any further abx as the skin lesion looks improved. Did not tolerate doxy well. If he does have +IgM, will tx w amox 500 mg TID for 14 d.  F/u prn. The patient voiced understanding and agreement to the plan.  Shellie Dials Stanton, DO 03/22/24 10:52 AM

## 2024-03-22 NOTE — Telephone Encounter (Signed)
 Disregard , pt came into office today for labs and was added to DOD schedule.

## 2024-03-22 NOTE — Telephone Encounter (Signed)
 Refer to other note.

## 2024-03-23 ENCOUNTER — Ambulatory Visit: Payer: Medicare Other | Admitting: *Deleted

## 2024-03-23 ENCOUNTER — Telehealth: Payer: Self-pay | Admitting: Acute Care

## 2024-03-23 VITALS — BP 135/66 | Temp 97.8°F | Resp 16 | Ht 64.0 in | Wt 179.4 lb

## 2024-03-23 DIAGNOSIS — Z122 Encounter for screening for malignant neoplasm of respiratory organs: Secondary | ICD-10-CM | POA: Diagnosis not present

## 2024-03-23 DIAGNOSIS — H35361 Drusen (degenerative) of macula, right eye: Secondary | ICD-10-CM | POA: Diagnosis not present

## 2024-03-23 DIAGNOSIS — H353231 Exudative age-related macular degeneration, bilateral, with active choroidal neovascularization: Secondary | ICD-10-CM | POA: Diagnosis not present

## 2024-03-23 DIAGNOSIS — H3554 Dystrophies primarily involving the retinal pigment epithelium: Secondary | ICD-10-CM | POA: Diagnosis not present

## 2024-03-23 DIAGNOSIS — Z87891 Personal history of nicotine dependence: Secondary | ICD-10-CM

## 2024-03-23 DIAGNOSIS — H35372 Puckering of macula, left eye: Secondary | ICD-10-CM | POA: Diagnosis not present

## 2024-03-23 DIAGNOSIS — Z0000 Encounter for general adult medical examination without abnormal findings: Secondary | ICD-10-CM

## 2024-03-23 NOTE — Patient Instructions (Addendum)
 Ryan [name] , Thank you for taking time out of your busy schedule to complete your Annual Wellness Visit with me. I enjoyed our conversation and look forward to speaking with you again next year. I, as well as your care team,  appreciate your ongoing commitment to your health goals. Please review the following plan we discussed and let me know if I can assist you in the future. Your Game plan/ To Do List    Referrals: If you haven't heard from the office you've been referred to, please reach out to them at the phone provided.  Lung Cancer Screening [facility]:  [phone] Follow up Visits: Next Medicare AWV with our clinical staff:  03/27/25 9am Next Office Visit with your provider: 05/10/24 2:40pm  Clinician Recommendations:  Aim for 30 minutes of exercise or brisk walking, 6-8 glasses of water , and 5 servings of fruits and vegetables each day.       This is a list of the screening recommended for you and due dates:  Health Maintenance  Topic Date Due   Screening for Lung Cancer  01/18/2023   Medicare Annual Wellness Visit  03/22/2024   COVID-19 Vaccine (8 - Pfizer risk 2024-25 season) 10/04/2024*   Flu Shot  05/05/2024   DTaP/Tdap/Td vaccine (4 - Td or Tdap) 05/29/2032   Pneumococcal Vaccine for age over 80  Completed   Hepatitis C Screening  Completed   Zoster (Shingles) Vaccine  Completed   HPV Vaccine  Aged Out   Meningitis B Vaccine  Aged Out   Colon Cancer Screening  Discontinued  *Topic was postponed. The date shown is not the original due date.    See attachments for Preventive Care Tips.

## 2024-03-23 NOTE — Progress Notes (Addendum)
 Subjective:   Ryan [name] is a 78 y.o. who presents for a Medicare Wellness preventive visit.  As a reminder, Annual Wellness Visits don't include a physical exam, and some assessments may be limited, especially if this visit is performed virtually. We may recommend an in-person follow-up visit with your provider if needed.  Visit Complete: In person  Persons Participating in Visit: Patient.  AWV Questionnaire: Yes: Patient Medicare AWV questionnaire was completed by the patient on 03/17/24; I have confirmed that all information answered by patient is correct and no changes since this date.  Cardiac Risk Factors include: advanced age (>48men, >16 women);male gender;hypertension;dyslipidemia;Other (see comment), Risk factor comments: CAD, Afib, CHF     Objective:    Today's Vitals   03/17/24 0819 03/23/24 0908  BP:  135/66  Resp:  16  Temp:  97.8 F (36.6 C)  TempSrc:  Oral  SpO2:  97%  Weight:  179 lb 6.4 oz (81.4 kg)  Height:  5' 4 (1.626 m)  PainSc: 5                                   BP                  135/66    BP Location -- Left Arm  Patient Position -- Sitting  Cuff Size -- Normal  Temp -- 97.8 F (36.6 C)  Temp Source -- Oral  Weight -- 179 lb 6.4 oz (81.4 kg)  Height -- 5' 4 (1.626 m)  Resp -- 16  SpO2 -- 97 %    Body mass index is 30.79 kg/m.     03/13/2024    6:26 PM 02/05/2024   11:34 AM 01/01/2024    9:39 AM 12/28/2023   10:34 AM 05/17/2023    5:00 PM 05/17/2023    7:56 AM 03/23/2023    8:50 AM  Advanced Directives  Does Patient Have a Medical Advance Directive? Yes No Yes Yes Yes Yes Yes  Type of Advance Directive Living will;Healthcare Power of Attorney  Living will;Healthcare Power of [employer] Power of [place];Living will Healthcare Power of [place];Living will  Healthcare Power of [place];Living will  Does patient want to make changes to medical advance directive?     No - Patient declined  No - Patient declined  Copy of  Healthcare Power of Attorney in Chart?   No - copy requested    Yes - validated most recent copy scanned in chart (See row information)  Would patient like information on creating a medical advance directive?  No - Patient declined         Current Medications (verified) Outpatient Encounter Medications as of 03/23/2024  Medication Sig   acetaminophen  (TYLENOL ) 500 MG tablet Take 1,000 mg by mouth at bedtime.   aspirin  EC 81 MG EC tablet Take 1 tablet (81 mg total) by mouth daily. Swallow whole.   budesonide  (ENTOCORT EC ) 3 MG 24 hr capsule Take 2 capsules (6 mg total) by mouth daily.   cholecalciferol  (VITAMIN D3) 25 MCG (1000 UNIT) tablet Take 10,000 Units by mouth 2 (two) times a week. Monday and Friday   cyclobenzaprine  (FLEXERIL ) 5 MG tablet Take 1 tablet (5 mg total) by mouth 2 (two) times daily as needed for up to 10 doses for muscle spasms.   diazepam  (VALIUM ) 5 MG tablet TAKE 1/2 TO 2 TABLETS(2.5 TO 10 MG) BY MOUTH EVERY  12 HOURS AS NEEDED FOR ANXIETY   diphenoxylate -atropine  (LOMOTIL ) 2.5-0.025 MG tablet Take 1 tablet by mouth 4 (four) times daily as needed for diarrhea or loose stools.   [Paused] ELIQUIS  5 MG TABS tablet TAKE 1 TABLET BY MOUTH TWICE  DAILY   Evolocumab  (REPATHA  SURECLICK) 140 MG/ML SOAJ Inject 140 mg into the skin every 14 (fourteen) days.   furosemide  (LASIX ) 20 MG tablet TAKE 1 TABLET BY MOUTH DAILY AS  NEEDED   latanoprost  (XALATAN ) 0.005 % ophthalmic solution Place 1 drop into the left eye at bedtime.    levalbuterol  (XOPENEX  HFA) 45 MCG/ACT inhaler Inhale 2 puffs into the lungs every 4 (four) hours as needed for wheezing or shortness of breath.   magnesium  oxide (MAG-OX) 400 (240 Mg) MG tablet TAKE 1 TABLET(400 MG) BY MOUTH THREE TIMES DAILY   metoprolol  tartrate (LOPRESSOR ) 100 MG tablet TAKE 1 TABLET BY MOUTH TWICE  DAILY   Multiple Vitamins-Minerals (PRESERVISION AREDS 2) CAPS Take 1 capsule by mouth daily.   nitroGLYCERIN  (NITROSTAT ) 0.4 MG SL tablet Place 1  tablet (0.4 mg total) under the tongue every 5 (five) minutes as needed for chest pain.   omeprazole  (PRILOSEC  OTC) 20 MG tablet Take 20 mg by mouth every morning.   Ranibizumab  (LUCENTIS  IO) Inject 1 Dose into the eye every 6 (six) weeks. Bilateral eye by Dr Craig Greven   sodium polystyrene (KAYEXALATE ) powder Take by mouth daily as needed (hyperkalemia).   tamsulosin  (FLOMAX ) 0.4 MG CAPS capsule Take 0.4 mg by mouth 2 (two) times daily.   No facility-administered encounter medications on file as of 03/23/2024.    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; Gleason 5+5; PSA 1.8 --> Tx'd with XRT (12/10/21 - 02/18/22) + LT-ADT; XRT course truncated due to severe radiation colitis   Allergy    Anemia    Anxiety    a.) on BZO PRN (diazepam )   Aortic atherosclerosis (HCC)    Bilateral carotid artery disease (HCC) 03/24/2021   a.) carotid doppler 03/24/2021: 1-39% BICA   BPH with urinary obstruction    CAD (coronary artery disease) 03/26/2011   a.) cCTA 03/26/2011: Ca2+ 505 (pRCA + LM); b.) LHC 04/01/2011: 40-50% oLM, 60-70% m-dLAD, 30% mD2, 60% pRCA - med mgmt; c.) LHC 10/17/2022L 90% oLM, 60% o-pLCx, 95% pRCA, 70% dRCA, 70% RPDA --> CVTS consult; d.) s/p 4v CABG 07/22/2021   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   Chronic diastolic CHF (congestive heart failure) (HCC) 01/14/2017   a.) TTE  01/14/2017: TTE 01/14/2017: EF 55-60%, sev LA dil, triv MR, G1DD; b.) TTE 03/23/2018: EF 55-60%, mod LVH, sev LA dil, mod-sev RA dil; c.) TTE 04/17/2020: EF 60-65%, mod asym ant seg LVH, mild LA dil, mild MR, G1DD; d.) TTE 03/25/2021: EF 50-55%, AoV sclerosis, triv MR   CKD (chronic kidney disease), stage III (HCC)    COPD (chronic obstructive pulmonary disease) (HCC)    DDD (degenerative disc disease), cervical    a.) s/p ACDF C5-C7 06/08/2002   DOE (dyspnea on exertion)    Eczema    Emphysema of lung (HCC)    Family history  of adverse reaction to anesthesia    a.) PONV and postoperative delirium/agitation in 1st degree relatives (sisters)   GERD (gastroesophageal reflux disease)    Glaucoma, left eye    Heart murmur    Hemorrhoids    History of adenomatous polyp of colon    History of bilateral cataract extraction 2021   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 Winn Havens--- bilateral eye injection every 6 wks   NSVT (nonsustained ventricular tachycardia) (HCC) 03/17/2022   a.) Zio patch 03/17/2022: 3 runs with fastest lasting 10 beats at rate of 190 bpm and longest lasting 16 beats at rate of 121 bpm   On apixaban  therapy    Osteoporosis 05/31/2016   PAF (paroxysmal atrial fibrillation) (HCC) 03/2012   a.)  CHA2DS2VASc = 7 (age x2, CHF, HTN, TIA x2, vascular disease history);  b.) s/p DCCV 04/25/2018 (150J x 1 --> SB); c.) rate/rhythm maintained on oral metoprolol  tartrate; chronically anticoagulated with apixaban    Postoperative ileus (HCC) 07/2021   a.) following CABG procedure   RBBB (right bundle branch block)    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   a.) LIMA-LAD, SVG-OM1, sequential SVG-acute marginal-PDA   SNHL (sensorineural hearing loss)    Statin intolerance    Vision loss of left eye    macular degeneration   Vitamin D  deficiency    Past Surgical History:  Procedure Laterality Date   ANTERIOR CERVICAL DECOMP/DISCECTOMY FUSION  06/08/2002   APPENDECTOMY  1953   CARDIAC CATHETERIZATION Left 04/01/2011   CARDIOVERSION N/A 04/25/2018   Procedure: CARDIOVERSION;  Surgeon: Maudine Sos, MD;  Location[facility] ENDOSCOPY;  Service: Cardiovascular;  Laterality: N/A;   CATARACT EXTRACTION W/ INTRAOCULAR LENS IMPLANT Bilateral 2021   COLONOSCOPY WITH ESOPHAGOGASTRODUODENOSCOPY (EGD)  01/28/2021   by [email or web address]   CORONARY ARTERY BYPASS GRAFT N/A 07/22/2021   Procedure: 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, ON PUMP, USING LEFT INTERNAL MAMMARY ARTERY AND ENDOSCOPICALLY HARVESTED RIGHT GREATER SAPHENOUS VEIN;  Surgeon: Zelphia Higashi, MD;  Location: [facility] OR;  Service: Open Heart Surgery;  Laterality: N/A;   ENDOVEIN HARVEST OF GREATER SAPHENOUS VEIN  07/22/2021   Procedure: ENDOVEIN HARVEST OF GREATER SAPHENOUS VEIN;  Surgeon: Zelphia Higashi, MD;  Location: [facility] OR;  Service: Open Heart Surgery;;   EXTRACORPOREAL SHOCK WAVE LITHOTRIPSY     x2  1990s   EYE SURGERY     FOOT SURGERY Right 1970   calcification removed from top of foot   GOLD SEED IMPLANT N/A 11/28/2021   Procedure: GOLD SEED IMPLANT;  Surgeon: Christina Coyer, MD;  Location: [facility];  Service: Urology;  Laterality: N/A;   KNEE ARTHROSCOPY Right 1991  LAPAROSCOPIC CHOLECYSTECTOMY  2000   LEFT HEART  CATH AND CORONARY ANGIOGRAPHY N/A 07/21/2021   Procedure: LEFT HEART CATH AND CORONARY ANGIOGRAPHY;  Surgeon: Avanell Leigh, MD;  Location: [facility] INVASIVE CV LAB;  Service: Cardiovascular;  Laterality: N/A;   LUMBAR LAMINECTOMY/DECOMPRESSION MICRODISCECTOMY N/A 05/17/2023   Procedure: L4-5 DECOMPRESSION;  Surgeon: Jodeen Munch, MD;  Location: [facility] ORS;  Service: Neurosurgery;  Laterality: N/A;   POSTERIOR CERVICAL FUSION/FORAMINOTOMY N/A 01/10/2024   Procedure: C4-7 POSTERIOR SPINAL INSTRUMENTATION;  Surgeon: Jodeen Munch, MD;  Location: [facility] ORS;  Service: Neurosurgery;  Laterality: N/A;   POSTERIOR CERVICAL LAMINECTOMY N/A 01/10/2024   Procedure: C4-5 LAMINECTOMY;  Surgeon: Jodeen Munch, MD;  Location: [facility] ORS;  Service: Neurosurgery;  Laterality: N/A;   SPACE OAR INSTILLATION N/A 11/28/2021   Procedure: SPACE OAR INSTILLATION;  Surgeon: Christina Coyer, MD;  Location[facility];  Service: Urology;  Laterality: N/A;   TEE WITHOUT CARDIOVERSION N/A 07/22/2021   Procedure: TRANSESOPHAGEAL ECHOCARDIOGRAM (TEE);  Surgeon: Zelphia Higashi, MD;  Location: [facility] OR;  Service: Open Heart Surgery;  Laterality: N/A;   TOTAL KNEE ARTHROPLASTY Right 08/14/2009   @[facility]    TRANSURETHRAL RESECTION OF PROSTATE N/A 07/17/2021   Procedure: TRANSURETHRAL RESECTION OF THE PROSTATE (TURP);  Surgeon: Trent Frizzle, MD;  Location: [facility];  Service: Urology;  Laterality: N/A;  1 HR   Family History  Problem Relation Age of Onset   Heart disease Mother    Diabetes Mother    Cirrhosis Mother    Emphysema Mother        never smoked but 2nd hand through her spouse   Hypertension Mother    Macular degeneration Mother    Heart disease Father    Cancer Father        prostate   Hyperlipidemia Father    Hypertension Father    Varicose Veins Father    Heart attack Father    Peripheral vascular disease Father    Heart disease Sister    Arthritis Sister     Hyperlipidemia Sister    Obesity Sister    Macular degeneration Sister    Heart disease Brother        5 stents   Hyperlipidemia Brother    Macular degeneration Maternal Grandfather    Cirrhosis Sister    Obesity Sister    Arthritis Sister    Heart disease Sister    Obesity Sister    Liver disease Other    Prostate cancer Other    Coronary artery disease Other    Colon cancer Neg Hx    Esophageal cancer Neg Hx    Rectal cancer Neg Hx    Stomach cancer Neg Hx    Social History   Socioeconomic History   Marital status: Married    Spouse name: Leola Raisin   Number of children: 0   Years of education: Not on file   Highest education level: Associate degree: academic program  Occupational History   Occupation: retired    Associate Professor: RETIRED  Tobacco Use   Smoking status: Former    Current packs/day: 0.00    Average packs/day: 0.5 packs/day for 50.0 years (25.0 ttl pk-yrs)    Types: Cigarettes    Start date: 05/02/1959    Quit date: 05/01/2009    Years since quitting: 14.9   Smokeless tobacco: Former    Types: Chew    Quit date: 1995   Tobacco comments:    Former smoker 01/27/22  Vaping Use   Vaping  status: Never Used  Substance and Sexual Activity   Alcohol use: Yes    Alcohol/week: 2.0 - 3.0 standard drinks of alcohol    Types: 1 - 2 Glasses of wine, 1 Cans of beer per week    Comment: 1-2 glasses wine/beer daily   Drug use: Never   Sexual activity: Yes    Comment: lives with wife, no dietary restrictions  Other Topics Concern   Not on file  Social History Narrative   Lives with wife   Social Drivers of Health   Financial Resource Strain: Low Risk  (03/23/2024)   Overall Financial Resource Strain (CARDIA)    Difficulty of Paying Living Expenses: Not very hard  Food Insecurity: No Food Insecurity (03/23/2024)   Hunger Vital Sign    Worried About Running Out of Food in the Last Year: Never true    Ran Out of Food in the Last Year: Never true  Transportation Needs:  No Transportation Needs (03/23/2024)   PRAPARE - Administrator, Civil Service (Medical): No    Lack of Transportation (Non-Medical): No  Physical Activity: Sufficiently Active (03/23/2024)   Exercise Vital Sign    Days of Exercise per Week: 3 days    Minutes of Exercise per Session: 60 min  Stress: No Stress Concern Present (03/23/2024)   [employer] of Occupational Health - Occupational Stress Questionnaire    Feeling of Stress: Not at all  Social Connections: Moderately Isolated (03/23/2024)   Social Connection and Isolation Panel    Frequency of Communication with Friends and Family: Three times a week    Frequency of Social Gatherings with Friends and Family: Once a week    Attends Religious Services: Never    Database administrator or Organizations: No    Attends Banker Meetings: Never    Marital Status: Married    Tobacco Counseling Counseling given: Not Answered Tobacco comments: Former smoker 01/27/22    Clinical Intake:  Pre-visit preparation completed: Yes  Pain : 0-10 Pain Score: 5  (has body pain that he says is coming from a reaction to doxycycline .) Pain Type: Acute pain     BMI - recorded: 30.79 Nutritional Status: BMI > 30  Obese Nutritional Risks: None Diabetes: No  Lab Results  Component Value Date   HGBA1C 5.1 02/10/2024   HGBA1C 5.7 10/14/2023   HGBA1C 5.5 10/27/2022     How often do you need to have someone help you when you read instructions, pamphlets, or other written materials from your doctor or pharmacy?: 1 - Never What is the last grade level you completed in school?: associate's  Interpreter Needed?: No  Information entered by :: Susa Engman, CMA   Activities of Daily Living     03/17/2024    8:19 AM 12/28/2023   10:40 AM  In your present state of health, do you have any difficulty performing the following activities:  Hearing? 0   Vision? 0   Difficulty concentrating or making decisions? 0    Walking or climbing stairs? 1   Dressing or bathing? 0   Doing errands, shopping? 0 0  Preparing Food and eating ? N   Using the Toilet? N   In the past six months, have you accidently leaked urine? Y   Comment had prostate cancer and has leakage since the treatment. Occasionally upon standing. Dr Joie Narrow is treating.   Do you have problems with loss of bowel control? N   Managing your Medications? N  Managing your Finances? N   Housekeeping or managing your Housekeeping? N     Patient Care Team: Neda Balk, MD as PCP - General (Family Medicine) Tammie Fall, MD as PCP - Cardiology (Cardiology) Yvonna Herder, MD as Consulting Physician (Neurosurgery) Tammie Fall, MD as Consulting Physician (Cardiology) Edyth Grana, MD as Referring Physician (Ophthalmology) Katheleen Palmer, RN as Oncology Nurse Navigator Trent Frizzle, MD as Consulting Physician (Urology) Kenith Payer, MD as Consulting Physician (Radiation Oncology) Debbie Fails Laura Polio, NP as Nurse Practitioner (Hematology and Oncology) Neda Balk, RN as Registered Nurse  I have updated your Care Teams any recent Medical Services you may have received from other providers in the past year.     Assessment:   This is a routine wellness examination for [place].  Hearing/Vision screen Hearing Screening - Comments:: Denies hearing difficulties.  Vision Screening - Comments:: Last eye exam  Dr Grevin at [facility] 6 weeks ago, has macular degeneration     Goals Addressed               This Visit's Progress     Patient Stated   On track     Maintain current healthy lifestyle.      Patient Stated (pt-stated)        He wants to maintain his current level of fitness       Depression Screen     03/23/2024    9:24 AM 03/22/2024   10:12 AM 03/23/2023    9:12 AM 02/08/2023    2:57 PM 10/27/2022    3:13 PM 05/21/2022    3:31 PM 05/21/2022    3:30 PM  PHQ 2/9 Scores  PHQ - 2 Score 0 0 0  0 0 0 0  PHQ- 9 Score    0  0     Fall Risk     03/17/2024    8:19 AM 05/27/2023    2:09 PM 03/23/2023    9:11 AM 02/08/2023    2:57 PM 10/27/2022    3:13 PM  Fall Risk   Falls in the past year? 1 0 0 0 0  Number falls in past yr: 0 0 0 0 0  Injury with Fall? 1 0 0 0 0  Risk for fall due to : Other (Comment)  Impaired balance/gait    Risk for fall due to: Comment fell taking care of spouse      Follow up Education provided Falls evaluation completed Falls evaluation completed Falls evaluation completed Falls evaluation completed      Data saved with a previous flowsheet row definition    MEDICARE RISK AT HOME:  Medicare Risk at Home Any stairs in or around the home?: (Patient-Rptd) Yes If so, are there any without handrails?: (Patient-Rptd) No Home free of loose throw rugs in walkways, pet beds, electrical cords, etc?: (Patient-Rptd) Yes Adequate lighting in your home to reduce risk of falls?: (Patient-Rptd) Yes Life alert?: (Patient-Rptd) No Use of a cane, walker or w/c?: (Patient-Rptd) Yes Grab bars in the bathroom?: (Patient-Rptd) Yes Shower chair or bench in shower?: (Patient-Rptd) Yes Elevated toilet seat or a handicapped toilet?: (Patient-Rptd) Yes  TIMED UP AND GO:  Was the test performed?  Yes  Length of time to ambulate 10 feet: 5 sec Gait steady and fast without use of assistive device  Cognitive Function: 6CIT completed        03/23/2024    9:37 AM 03/23/2023    9:15 AM 03/18/2022  9:37 AM  6CIT Screen  What Year? 0 points 0 points 0 points  What month? 0 points 0 points 0 points  What time? 0 points 0 points 0 points  Count back from 20 0 points 0 points 0 points  Months in reverse 0 points 0 points 0 points  Repeat phrase 0 points 0 points 0 points  Total Score 0 points 0 points 0 points    Immunizations Immunization History  Administered Date(s) Administered   Fluad Quad(high Dose 65+) 06/24/2020   Influenza Split 10/05/2000, 07/18/2009,  06/20/2010, 07/06/2011, 07/05/2012, 06/22/2013, 06/25/2014, 05/28/2018, 05/31/2019   Influenza Whole 10/05/2000, 07/18/2009, 06/20/2010, 07/05/2012   Influenza, High Dose Seasonal PF 06/23/2016, 06/21/2017, 05/28/2018, 05/31/2019, 06/24/2020, 05/29/2022, 06/30/2023   Influenza,inj,Quad PF,6+ Mos 06/25/2014   Influenza-Unspecified 05/28/2018, 06/19/2021, 08/05/2021   Moderna Covid-19 Fall Seasonal Vaccine 68yrs & older 06/30/2023   PFIZER(Purple Top)SARS-COV-2 Vaccination 10/28/2019, 11/18/2019, 07/24/2020, 02/21/2021   Pfizer Covid-19 Vaccine Bivalent Booster 66yrs & up 08/21/2021, 07/19/2022   Pneumococcal Conjugate-13 12/01/2013, 06/10/2016   Pneumococcal Polysaccharide-23 07/06/2007, 06/23/2016   Pneumococcal-Unspecified 08/05/2021   RSV,unspecified 07/19/2022   Td 10/05/2002   Tdap 05/06/2012, 05/29/2022   Zoster Recombinant(Shingrix) 06/28/2018, 10/12/2018   Zoster, Live 03/27/2009   Zoster, Unspecified 06/28/2018, 10/12/2018    Screening Tests Health Maintenance  Topic Date Due   Lung Cancer Screening  01/18/2023   COVID-19 Vaccine (8 - Pfizer risk 2024-25 season) 10/04/2024 (Originally 12/28/2023)   INFLUENZA VACCINE  05/05/2024   Medicare Annual Wellness (AWV)  03/23/2025   DTaP/Tdap/Td (4 - Td or Tdap) 05/29/2032   Pneumococcal Vaccine: 50+ Years  Completed   Hepatitis C Screening  Completed   Zoster Vaccines- Shingrix  Completed   HPV VACCINES  Aged Out   Meningococcal B Vaccine  Aged Out   Colonoscopy  Discontinued    Health Maintenance  Health Maintenance Due  Topic Date Due   Lung Cancer Screening  01/18/2023   Health Maintenance Items Addressed: Referral sent to [facility] (smoker/hx smoking)  Additional Screening:  Vision Screening: Recommended annual ophthalmology exams for early detection of glaucoma and other disorders of the eye. Would you like a referral to an eye doctor? No    Dental Screening: Recommended annual dental exams for proper  oral hygiene  Community Resource Referral / Chronic Care Management: CRR required this visit?  No   CCM required this visit?  No   Plan:    I have personally reviewed and noted the following in the patient's chart:   Medical and social history Use of alcohol, tobacco or illicit drugs  Current medications and supplements including opioid prescriptions. Patient is not currently taking opioid prescriptions. Functional ability and status Nutritional status Physical activity Advanced directives List of other physicians Hospitalizations, surgeries, and ER visits in previous 12 months Vitals Screenings to include cognitive, depression, and falls Referrals and appointments  In addition, I have reviewed and discussed with patient certain preventive protocols, quality metrics, and best practice recommendations. A written personalized care plan for preventive services as well as general preventive health recommendations were provided to patient.   Susa Engman, CMA   03/23/2024   After Visit Summary: (In Person-Printed) AVS printed and given to the patient  Notes: Nothing significant to report at this time.

## 2024-03-23 NOTE — Progress Notes (Signed)
Patient reviewed via MyChart.

## 2024-03-23 NOTE — Telephone Encounter (Signed)
 Lung Cancer Screening Narrative/Criteria Questionnaire (Cigarette Smokers Only- No Cigars/Pipes/vapes)   Ryan [name]   SDMV:04/05/2024 2:00pm       04/15/1946   LDCT: 04/06/2024 1:00pm    78 y.o.   Phone: [phone]  Lung Screening Narrative (confirm age 63-77 yrs Medicare / 50-80 yrs Private pay insurance)   Insurance information:Mcr and AAPR   Referring Provider:Dr. Rodrick Clapper   This screening involves an initial phone call with a team member from our program. It is called a shared decision making visit. The initial meeting is required by  insurance and Medicare to make sure you understand the program. This appointment takes about 15-20 minutes to complete. You will complete the screening scan at your scheduled date/time.  This scan takes about 5-10 minutes to complete. You can eat and drink normally before and after the scan.  Criteria questions for Lung Cancer Screening:   Are you a current or former smoker? Former Age began smoking: 78yo   If you are a former smoker, what year did you quit smoking? Quit 15 years ago, in addition to another time he quit for 2 years then started again (within 15 yrs)   To calculate your smoking history, I need an accurate estimate of how many packs of cigarettes you smoked per day and for how many years. (Not just the number of PPD you are now smoking)   Years smoking 46 x Packs per day 1 = Pack years 46   (at least 20 pack yrs)   (Make sure they understand that we need to know how much they have smoked in the past, not just the number of PPD they are smoking now)  Do you have a personal history of cancer?  Yes - (type and when diagnosed - 5 yrs cancer free) Prostate, dx in 2023 - pt had PSA 3 months ago WNL, followed by [facility]    Do you have a family history of cancer? Yes  (cancer type and and relative) Father and brother - prostate  Are you coughing up blood?  No  Have you had unexplained weight loss of 15 lbs or more in the last 6  months? No  It looks like you meet all criteria.  When would be a good time for us  to schedule you for this screening?   Additional information: N/A

## 2024-03-24 ENCOUNTER — Other Ambulatory Visit: Payer: Self-pay | Admitting: Orthopedic Surgery

## 2024-03-24 DIAGNOSIS — G959 Disease of spinal cord, unspecified: Secondary | ICD-10-CM

## 2024-03-24 DIAGNOSIS — Z981 Arthrodesis status: Secondary | ICD-10-CM

## 2024-03-24 LAB — B. BURGDORFI ANTIBODIES: B burgdorferi Ab IgG+IgM: <0.9 {index}

## 2024-03-24 NOTE — Progress Notes (Signed)
   REFERRING PHYSICIAN:  Domenica Harlene LABOR, Md 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   HISTORY OF PRESENT ILLNESS:  He was doing well at his last visit.   He has some posterior neck pain in the morning that improves as day goes on. He still feels a lot of muscle tightness. HHPT recommended dry needling.   Was put on doxycycline  for a tick bite- had side effects and stopped it. Had blood test that was negative for lyme disease.   He was taking prn zanaflex  and needs a refill. He thinks this works better than the flexeril .    PHYSICAL EXAMINATION:  NEUROLOGICAL:  General: In no acute distress.   Awake, alert, oriented to person, place, and time.  Pupils equal round and reactive to light.    Strength: Side Biceps Triceps Deltoid Interossei Grip Wrist Ext. Wrist Flex.  R 5 5 5 5 5 5 5   L 5 5 5 5 5 5 5    Incision well healed.    Imaging:  Cervical xrays dated 04/03/24:  No complications noted.   Report for above xrays not yet available.   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 Treatment options discussed with patient and following plan made:   - Return to activity slowly as tolerated.  - PT orders to [facility] for evaluation and dry needling.  - Refill given on zanaflex . Reviewed dosing and side effects. He knows this can  make him sleepy.  - Follow up in 6 months with Dr. Clois. Will need xrays prior to this visit.   Glade Boys PA-C Dept of Neurosurgery

## 2024-03-25 ENCOUNTER — Ambulatory Visit: Payer: Self-pay | Admitting: Family Medicine

## 2024-03-30 ENCOUNTER — Encounter: Payer: Medicare Other | Admitting: Orthopedic Surgery

## 2024-04-03 ENCOUNTER — Ambulatory Visit
Admission: RE | Admit: 2024-04-03 | Discharge: 2024-04-03 | Disposition: A | Discharge status: Home / Self Care | Source: Ambulatory Visit | Attending: Orthopedic Surgery | Admitting: Orthopedic Surgery

## 2024-04-03 ENCOUNTER — Ambulatory Visit: Payer: Medicare Other | Admitting: Orthopedic Surgery

## 2024-04-03 ENCOUNTER — Ambulatory Visit
Admission: RE | Admit: 2024-04-03 | Discharge: 2024-04-03 | Disposition: A | Discharge status: Home / Self Care | Attending: Orthopedic Surgery | Admitting: Orthopedic Surgery

## 2024-04-03 ENCOUNTER — Encounter: Payer: Self-pay | Admitting: Orthopedic Surgery

## 2024-04-03 VITALS — BP 138/78 | Temp 97.9°F | Ht 64.0 in | Wt 179.0 lb

## 2024-04-03 DIAGNOSIS — G959 Disease of spinal cord, unspecified: Secondary | ICD-10-CM | POA: Diagnosis not present

## 2024-04-03 DIAGNOSIS — S12400A Unspecified displaced fracture of fifth cervical vertebra, initial encounter for closed fracture: Secondary | ICD-10-CM | POA: Diagnosis not present

## 2024-04-03 DIAGNOSIS — M7918 Myalgia, other site: Secondary | ICD-10-CM

## 2024-04-03 DIAGNOSIS — Z981 Arthrodesis status: Secondary | ICD-10-CM

## 2024-04-03 MED ORDER — TIZANIDINE HCL 4 MG PO TABS: 4.0000 mg | ORAL_TABLET | Freq: Three times a day (TID) | ORAL | 0 refills | Status: AC | PRN

## 2024-04-05 ENCOUNTER — Ambulatory Visit: Admitting: *Deleted

## 2024-04-05 ENCOUNTER — Encounter: Payer: Self-pay | Admitting: *Deleted

## 2024-04-05 DIAGNOSIS — Z87891 Personal history of nicotine dependence: Secondary | ICD-10-CM | POA: Diagnosis not present

## 2024-04-05 NOTE — Progress Notes (Signed)
 Virtual Visit via Telephone Note  I connected with [name] on 04/05/24 at  2:00 PM EDT by telephone and verified that I am speaking with the correct person using two identifiers.  Location: Patient: in home Provider: [address] [place], [place], [address]     Shared Decision Making Visit Lung Cancer Screening Program [phone])   Eligibility: Age 78 y.o. Pack Years Smoking History Calculation 46 (# packs/per year x # years smoked) Recent History of coughing up blood  no Unexplained weight loss? no ( >Than 15 pounds within the last 6 months ) Prior History Lung / other cancer yes- greater than 5 years (Diagnosis within the last 5 years already requiring surveillance chest CT Scans). Smoking Status Former Smoker Former Smokers: Years since quit: 15 years  Quit Date: 05-01-09  Visit Components: Discussion included one or more decision making aids. yes Discussion included risk/benefits of screening. yes Discussion included potential follow up diagnostic testing for abnormal scans. yes Discussion included meaning and risk of over diagnosis. yes Discussion included meaning and risk of False Positives. yes Discussion included meaning of total radiation exposure. yes  Counseling Included: Importance of adherence to annual lung cancer LDCT screening. yes Impact of comorbidities on ability to participate in the program. yes Ability and willingness to under diagnostic treatment. yes  Smoking Cessation Counseling: Current Smokers:  Discussed importance of smoking cessation. yes Information about tobacco cessation classes and interventions provided to patient. yes Patient provided with ticket for LDCT Scan. yes Symptomatic Patient. no  Counseling NA Diagnosis Code: Tobacco Use Z72.0 Asymptomatic Patient yes  Counseling (Intermediate counseling: > three minutes counseling) H9563 Former Smokers:  Discussed the importance of maintaining cigarette abstinence.  yes Diagnosis Code: Personal History of Nicotine Dependence. S12.108 Information about tobacco cessation classes and interventions provided to patient. Yes Patient provided with ticket for LDCT Scan. yes Written Order for Lung Cancer Screening with LDCT placed in Epic. Yes (CT Chest Lung Cancer Screening Low Dose W/O CM) [id number] Z12.2-Screening of respiratory organs Z87.891-Personal history of nicotine dependence   Josette Ranger, RN 04/05/24

## 2024-04-05 NOTE — Patient Instructions (Signed)

## 2024-04-06 ENCOUNTER — Ambulatory Visit (HOSPITAL_BASED_OUTPATIENT_CLINIC_OR_DEPARTMENT_OTHER)
Admission: RE | Admit: 2024-04-06 | Discharge: 2024-04-06 | Disposition: A | Discharge status: Home / Self Care | Source: Ambulatory Visit | Attending: Acute Care | Admitting: Acute Care

## 2024-04-06 DIAGNOSIS — Z87891 Personal history of nicotine dependence: Secondary | ICD-10-CM | POA: Diagnosis not present

## 2024-04-06 DIAGNOSIS — Z122 Encounter for screening for malignant neoplasm of respiratory organs: Secondary | ICD-10-CM | POA: Insufficient documentation

## 2024-04-13 ENCOUNTER — Ambulatory Visit: Payer: Medicare Other | Admitting: Family Medicine

## 2024-04-17 ENCOUNTER — Other Ambulatory Visit: Payer: Self-pay

## 2024-04-17 DIAGNOSIS — Z981 Arthrodesis status: Secondary | ICD-10-CM | POA: Diagnosis not present

## 2024-04-17 DIAGNOSIS — G959 Disease of spinal cord, unspecified: Secondary | ICD-10-CM | POA: Diagnosis not present

## 2024-04-17 DIAGNOSIS — M7918 Myalgia, other site: Secondary | ICD-10-CM | POA: Diagnosis not present

## 2024-04-24 DIAGNOSIS — M7918 Myalgia, other site: Secondary | ICD-10-CM | POA: Diagnosis not present

## 2024-04-24 DIAGNOSIS — G959 Disease of spinal cord, unspecified: Secondary | ICD-10-CM | POA: Diagnosis not present

## 2024-04-24 DIAGNOSIS — Z981 Arthrodesis status: Secondary | ICD-10-CM | POA: Diagnosis not present

## 2024-05-03 ENCOUNTER — Other Ambulatory Visit (HOSPITAL_BASED_OUTPATIENT_CLINIC_OR_DEPARTMENT_OTHER): Payer: Self-pay

## 2024-05-03 ENCOUNTER — Other Ambulatory Visit: Payer: Self-pay | Admitting: Internal Medicine

## 2024-05-03 DIAGNOSIS — I6300 Cerebral infarction due to thrombosis of unspecified precerebral artery: Secondary | ICD-10-CM

## 2024-05-03 DIAGNOSIS — I2510 Atherosclerotic heart disease of native coronary artery without angina pectoris: Secondary | ICD-10-CM

## 2024-05-03 MED ORDER — REPATHA SURECLICK 140 MG/ML ~~LOC~~ SOAJ
140.0000 mg | SUBCUTANEOUS | 1 refills | Status: DC
  Filled 2024-05-03: qty 6, 84d supply, fill #0
  Filled 2024-07-22: qty 6, 84d supply, fill #1

## 2024-05-04 ENCOUNTER — Encounter (HOSPITAL_BASED_OUTPATIENT_CLINIC_OR_DEPARTMENT_OTHER): Payer: Self-pay | Admitting: Urology

## 2024-05-04 DIAGNOSIS — H35363 Drusen (degenerative) of macula, bilateral: Secondary | ICD-10-CM | POA: Diagnosis not present

## 2024-05-04 DIAGNOSIS — H353231 Exudative age-related macular degeneration, bilateral, with active choroidal neovascularization: Secondary | ICD-10-CM | POA: Diagnosis not present

## 2024-05-04 DIAGNOSIS — C61 Malignant neoplasm of prostate: Secondary | ICD-10-CM

## 2024-05-09 NOTE — Assessment & Plan Note (Signed)
 Rate controlled.  Follows with cardiology.

## 2024-05-09 NOTE — Assessment & Plan Note (Signed)
Tolerating Repatha. Encourage heart healthy diet such as MIND or DASH diet, increase exercise, avoid trans fats, simple carbohydrates and processed foods, consider a krill or fish or flaxseed oil cap daily.   

## 2024-05-09 NOTE — Assessment & Plan Note (Signed)
 Encouraged to get adequate exercise, calcium and vitamin d intake

## 2024-05-09 NOTE — Assessment & Plan Note (Addendum)
 Patient is on aspirin , Repatha  and metoprolol .  Followed by cardiology.

## 2024-05-09 NOTE — Assessment & Plan Note (Signed)
 Stable on Flomax.  Follows with urology.

## 2024-05-09 NOTE — Assessment & Plan Note (Signed)
 Stable.  Using albuterol  in the morning 1 or 2 puffs depending on activity level. And he uses it prior to exercise.

## 2024-05-09 NOTE — Assessment & Plan Note (Signed)
 [id number] acceptable, minimize simple carbs. Increase exercise as tolerated.

## 2024-05-09 NOTE — Assessment & Plan Note (Signed)
 Supplement and monitor

## 2024-05-09 NOTE — Assessment & Plan Note (Signed)
Increase leafy greens, consider increased lean red meat and using cast iron cookware.

## 2024-05-09 NOTE — Assessment & Plan Note (Signed)
 On budesonide .  Follows with GI.

## 2024-05-09 NOTE — Assessment & Plan Note (Addendum)
 Stable on omeprazole  20 mg twice daily.  Follows with GI.

## 2024-05-09 NOTE — Assessment & Plan Note (Signed)
 Well controlled, no changes to meds. Encouraged heart healthy diet such as the DASH diet and exercise as tolerated.

## 2024-05-09 NOTE — Progress Notes (Unsigned)
 Subjective:     Patient ID: Ryan [name], male    DOB: 27-Dec-1945, 78 y.o.   MRN: [id number]  No chief complaint on file.   HPI  Ryan [name] is a 78 year old patient presents for follow-up chronic conditions.  PMHx-adenocarcinoma prostate, aortic atherosclerosis, HTN CAD, BPH, CHF, CKD stage III, COPD, eczema, history of TIA, osteoporosis, paroxysmal atrial fibrillation, s/p CABG x 4, vitamin D  deficiency  Patient Care Team: Domenica Harlene LABOR, MD as PCP - General (Family Medicine) Waddell Danelle ORN, MD as PCP - Cardiology (Cardiology) Alix Charleston, MD as Consulting Physician (Neurosurgery) Waddell Danelle ORN, MD as Consulting Physician (Cardiology) Cheree Banks, MD as Referring Physician (Ophthalmology) Vertell Pont, RN as Oncology Nurse Navigator Matilda Senior, MD as Consulting Physician (Urology) Patrcia Cough, MD as Consulting Physician (Radiation Oncology) Crawford, Morna Pickle, NP as Nurse Practitioner (Hematology and Oncology) Starla Wendelyn BIRCH, RN as Registered Nurse   Eliquis  has been paused  HTN Metoprolol  100 mg twice daily  CHF Lasix  20 mg take 1 tablet by mouth daily as needed  HLD-Repatha  injection every 2 weeks  GERD-Omeprazole  20 mg twice daily  BPH-tamsulosin  (Flomax ) 0.4 mg by mouth twice daily  Lymphocytic Colitis, Hx Crohnes Dz followed by Gastroenterology Budesonide  6 mg daily Per GI- Lomotil  on demand if breakthrough   Anxiety-diazepam  (Valium ) 5 mg take 1/2 to 2 tablets by mouth every 12 hours as needed for anxiety  Lumbar stenosis-follows with neurosurgery Had lumbar laminectomy/decompression 05/25/2023 takes tizanidine  4 mg by mouth every 8 hours for muscle spasms Per Neuro Sx- PT orders to [facility] for evaluation and dry needling.   Vit D deficiency-vitamin D3 1000 units daily  Patient denies fever, chills, SOB, CP, palpitations, dyspnea, edema, HA, vision changes, N/V/D, abdominal pain, urinary symptoms, rash, weight changes,  and recent illness or hospitalizations.   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; Gleason 5+5; PSA 1.8 --> Tx'd with XRT (12/10/21 - 02/18/22) + LT-ADT; XRT course truncated due to severe radiation colitis   Allergy    Anemia    Anxiety    a.) on BZO PRN (diazepam )   Aortic atherosclerosis (HCC)    Bilateral carotid artery disease (HCC) 03/24/2021   a.) carotid doppler 03/24/2021: 1-39% BICA   BPH with urinary obstruction    CAD (coronary artery disease) 03/26/2011   a.) cCTA 03/26/2011: Ca2+ 505 (pRCA + LM); b.) LHC 04/01/2011: 40-50% oLM, 60-70% m-dLAD, 30% mD2, 60% pRCA - med mgmt; c.) LHC 10/17/2022L 90% oLM, 60% o-pLCx, 95% pRCA, 70% dRCA, 70% RPDA --> CVTS consult; d.) s/p 4v CABG 07/22/2021   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   Chronic diastolic CHF (congestive heart failure) (HCC) 01/14/2017   a.) TTE 01/14/2017: TTE 01/14/2017: EF 55-60%, sev LA dil, triv MR, G1DD; b.) TTE 03/23/2018: EF 55-60%, mod LVH, sev LA dil, mod-sev RA dil; c.) TTE 04/17/2020: EF 60-65%, mod asym ant seg LVH, mild LA dil, mild MR, G1DD; d.) TTE 03/25/2021: EF 50-55%, AoV sclerosis, triv MR   CKD (chronic kidney disease), stage III (HCC)    COPD (chronic obstructive pulmonary disease) (HCC)    DDD (degenerative disc disease), cervical  a.) s/p ACDF C5-C7 06/08/2002   DOE (dyspnea on exertion)    Eczema    Emphysema of lung (HCC)    Family history of adverse reaction to anesthesia    a.) PONV and postoperative delirium/agitation in 1st degree relatives (sisters)   GERD (gastroesophageal reflux disease)    Glaucoma, left eye    Heart murmur    Hemorrhoids    History of adenomatous polyp of colon    History  of bilateral cataract extraction 2021   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 [place]--- dx by biopsy 01-28-2021   Macular degeneration of both eyes    followed by dr c. cheree--- bilateral eye injection every 6 wks   NSVT (nonsustained ventricular tachycardia) (HCC) 03/17/2022   a.) Zio patch 03/17/2022: 3 runs with fastest lasting 10 beats at rate of 190 bpm and longest lasting 16 beats at rate of 121 bpm   On apixaban  therapy    Osteoporosis 05/31/2016   PAF (paroxysmal atrial fibrillation) (HCC) 03/2012   a.) CHA2DS2VASc = 7 (age x2, CHF, HTN, TIA x2, vascular disease history);  b.) s/p DCCV 04/25/2018 (150J x 1 --> SB); c.) rate/rhythm maintained on oral metoprolol  tartrate; chronically anticoagulated with apixaban    Postoperative ileus (HCC) 07/2021   a.) following CABG procedure   RBBB (right bundle branch block)    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   a.) LIMA-LAD, SVG-OM1, sequential SVG-acute marginal-PDA   SNHL (sensorineural hearing loss)    Statin intolerance    Vision loss of left eye    macular degeneration   Vitamin D  deficiency     Past Surgical History:  Procedure Laterality Date   ANTERIOR CERVICAL DECOMP/DISCECTOMY FUSION  06/08/2002   APPENDECTOMY  1953   CARDIAC CATHETERIZATION Left 04/01/2011   CARDIOVERSION N/A 04/25/2018   Procedure: CARDIOVERSION;  Surgeon: Raford Riggs, MD;   Location: [facility] ENDOSCOPY;  Service: Cardiovascular;  Laterality: N/A;   CATARACT EXTRACTION W/ INTRAOCULAR LENS IMPLANT Bilateral 2021   COLONOSCOPY WITH ESOPHAGOGASTRODUODENOSCOPY (EGD)  01/28/2021   by fzinqq   CORONARY ARTERY BYPASS GRAFT N/A 07/22/2021   Procedure: 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, ON PUMP, USING LEFT INTERNAL MAMMARY ARTERY AND ENDOSCOPICALLY HARVESTED RIGHT GREATER SAPHENOUS VEIN;  Surgeon: Kerrin Elspeth BROCKS, MD;  Location: [facility] OR;  Service: Open Heart Surgery;  Laterality: N/A;   ENDOVEIN HARVEST OF GREATER SAPHENOUS VEIN  07/22/2021   Procedure: ENDOVEIN HARVEST OF GREATER SAPHENOUS VEIN;  Surgeon: Kerrin Elspeth BROCKS, MD;  Location: [facility] OR;  Service: Open Heart Surgery;;   EXTRACORPOREAL SHOCK WAVE LITHOTRIPSY     x2  1990s   EYE SURGERY     FOOT SURGERY Right 1970   calcification removed from top of foot   GOLD SEED IMPLANT N/A 11/28/2021  Procedure: GOLD SEED IMPLANT;  Surgeon: Nieves Cough, MD;  Location: [facility];  Service: Urology;  Laterality: N/A;   KNEE ARTHROSCOPY Right 1991   LAPAROSCOPIC CHOLECYSTECTOMY  2000   LEFT HEART CATH AND CORONARY ANGIOGRAPHY N/A 07/21/2021   Procedure: LEFT HEART CATH AND CORONARY ANGIOGRAPHY;  Surgeon: Court Dorn PARAS, MD;  Location: [facility] INVASIVE CV LAB;  Service: Cardiovascular;  Laterality: N/A;   LUMBAR LAMINECTOMY/DECOMPRESSION MICRODISCECTOMY N/A 05/17/2023   Procedure: L4-5 DECOMPRESSION;  Surgeon: Clois Fret, MD;  Location: [facility] ORS;  Service: Neurosurgery;  Laterality: N/A;   POSTERIOR CERVICAL FUSION/FORAMINOTOMY N/A 01/10/2024   Procedure: C4-7 POSTERIOR SPINAL INSTRUMENTATION;  Surgeon: Clois Fret, MD;  Location: [facility] ORS;  Service: Neurosurgery;  Laterality: N/A;   POSTERIOR CERVICAL LAMINECTOMY N/A 01/10/2024   Procedure: C4-5 LAMINECTOMY;  Surgeon: Clois Fret, MD;  Location: [facility] ORS;  Service: Neurosurgery;  Laterality: N/A;   SPACE OAR INSTILLATION N/A  11/28/2021   Procedure: SPACE OAR INSTILLATION;  Surgeon: Nieves Cough, MD;  Location: [facility];  Service: Urology;  Laterality: N/A;   TEE WITHOUT CARDIOVERSION N/A 07/22/2021   Procedure: TRANSESOPHAGEAL ECHOCARDIOGRAM (TEE);  Surgeon: Kerrin Elspeth BROCKS, MD;  Location: [facility] OR;  Service: Open Heart Surgery;  Laterality: N/A;   TOTAL KNEE ARTHROPLASTY Right 08/14/2009   @[facility]    TRANSURETHRAL RESECTION OF PROSTATE N/A 07/17/2021   Procedure: TRANSURETHRAL RESECTION OF THE PROSTATE (TURP);  Surgeon: Matilda Senior, MD;  Location: [facility];  Service: Urology;  Laterality: N/A;  1 HR    Family History  Problem Relation Age of Onset   Heart disease Mother    Diabetes Mother    Cirrhosis Mother    Emphysema Mother        never smoked but 2nd hand through her spouse   Hypertension Mother    Macular degeneration Mother    Heart disease Father    Cancer Father        prostate   Hyperlipidemia Father    Hypertension Father    Varicose Veins Father    Heart attack Father    Peripheral vascular disease Father    Heart disease Sister    Arthritis Sister    Hyperlipidemia Sister    Obesity Sister    Macular degeneration Sister    Heart disease Brother        5 stents   Hyperlipidemia Brother    Macular degeneration Maternal Grandfather    Cirrhosis Sister    Obesity Sister    Arthritis Sister    Heart disease Sister    Obesity Sister    Liver disease Other    Prostate cancer Other    Coronary artery disease Other    Colon cancer Neg Hx    Esophageal cancer Neg Hx    Rectal cancer Neg Hx    Stomach cancer Neg Hx     Social History   Socioeconomic History   Marital status: Married    Spouse name: Romero   Number of children: 0   Years of education: Not on file   Highest education level: Associate degree: academic program  Occupational History   Occupation: retired    Associate Professor: RETIRED  Tobacco Use   Smoking status: Former     Current packs/day: 0.00    Average packs/day: 0.5 packs/day for 50.0 years (25.0 ttl pk-yrs)    Types: Cigarettes    Start date: 05/02/1959    Quit date: 05/01/2009    Years since quitting: 15.0  Smokeless tobacco: Former    Types: Chew    Quit date: 1995   Tobacco comments:    Former smoker 01/27/22  Vaping Use   Vaping status: Never Used  Substance and Sexual Activity   Alcohol use: Yes    Alcohol/week: 2.0 - 3.0 standard drinks of alcohol    Types: 1 - 2 Glasses of wine, 1 Cans of beer per week    Comment: 1-2 glasses wine/beer daily   Drug use: Never   Sexual activity: Yes    Comment: lives with wife, no dietary restrictions  Other Topics Concern   Not on file  Social History Narrative   Lives with wife   Social Drivers of Health   Financial Resource Strain: Low Risk  (05/03/2024)   Overall Financial Resource Strain (CARDIA)    Difficulty of Paying Living Expenses: Not hard at all  Food Insecurity: No Food Insecurity (05/03/2024)   Hunger Vital Sign    Worried About Running Out of Food in the Last Year: Never true    Ran Out of Food in the Last Year: Never true  Transportation Needs: No Transportation Needs (05/03/2024)   PRAPARE - Administrator, Civil Service (Medical): No    Lack of Transportation (Non-Medical): No  Physical Activity: Insufficiently Active (05/03/2024)   Exercise Vital Sign    Days of Exercise per Week: 2 days    Minutes of Exercise per Session: 30 min  Stress: No Stress Concern Present (05/03/2024)   [employer] of Occupational Health - Occupational Stress Questionnaire    Feeling of Stress: Not at all  Social Connections: Moderately Isolated (05/03/2024)   Social Connection and Isolation Panel    Frequency of Communication with Friends and Family: Twice a week    Frequency of Social Gatherings with Friends and Family: Once a week    Attends Religious Services: Never    Database administrator or Organizations: No    Attends Probation officer: Not on file    Marital Status: Married  Catering manager Violence: Not At Risk (03/23/2024)   Humiliation, Afraid, Rape, and Kick questionnaire    Fear of Current or Ex-Partner: No    Emotionally Abused: No    Physically Abused: No    Sexually Abused: No    Outpatient Medications Prior to Visit  Medication Sig Dispense Refill   acetaminophen  (TYLENOL ) 500 MG tablet Take 1,000 mg by mouth at bedtime.     aspirin  EC 81 MG EC tablet Take 1 tablet (81 mg total) by mouth daily. Swallow whole. 30 tablet 11   budesonide  (ENTOCORT EC ) 3 MG 24 hr capsule Take 2 capsules (6 mg total) by mouth daily. 180 capsule 3   cholecalciferol  (VITAMIN D3) 25 MCG (1000 UNIT) tablet Take 10,000 Units by mouth 2 (two) times a week. Monday and Friday     diazepam  (VALIUM ) 5 MG tablet TAKE 1/2 TO 2 TABLETS(2.5 TO 10 MG) BY MOUTH EVERY 12 HOURS AS NEEDED FOR ANXIETY 30 tablet 2   diphenoxylate -atropine  (LOMOTIL ) 2.5-0.025 MG tablet Take 1 tablet by mouth 4 (four) times daily as needed for diarrhea or loose stools.     ELIQUIS  5 MG TABS tablet TAKE 1 TABLET BY MOUTH TWICE  DAILY 180 tablet 3   Evolocumab  (REPATHA  SURECLICK) 140 MG/ML SOAJ Inject 140 mg into the skin every 14 (fourteen) days. 6 mL 1   furosemide  (LASIX ) 20 MG tablet TAKE 1 TABLET BY MOUTH DAILY AS  NEEDED 90  tablet 3   latanoprost  (XALATAN ) 0.005 % ophthalmic solution Place 1 drop into the left eye at bedtime.      levalbuterol  (XOPENEX  HFA) 45 MCG/ACT inhaler Inhale 2 puffs into the lungs every 4 (four) hours as needed for wheezing or shortness of breath. 15 g 5   magnesium  oxide (MAG-OX) 400 (240 Mg) MG tablet TAKE 1 TABLET(400 MG) BY MOUTH THREE TIMES DAILY 270 tablet 1   metoprolol  tartrate (LOPRESSOR ) 100 MG tablet TAKE 1 TABLET BY MOUTH TWICE  DAILY 180 tablet 0   Multiple Vitamins-Minerals (PRESERVISION AREDS 2) CAPS Take 1 capsule by mouth daily.     nitroGLYCERIN  (NITROSTAT ) 0.4 MG SL tablet Place 1 tablet (0.4 mg  total) under the tongue every 5 (five) minutes as needed for chest pain. 30 tablet 0   omeprazole  (PRILOSEC  OTC) 20 MG tablet Take 20 mg by mouth every morning.     Ranibizumab  (LUCENTIS  IO) Inject 1 Dose into the eye every 6 (six) weeks. Bilateral eye by Dr Craig Greven     sodium polystyrene (KAYEXALATE ) powder Take by mouth daily as needed (hyperkalemia). 454 g 1   tamsulosin  (FLOMAX ) 0.4 MG CAPS capsule Take 0.4 mg by mouth 2 (two) times daily.     tiZANidine  (ZANAFLEX ) 4 MG tablet Take 1 tablet (4 mg total) by mouth every 8 (eight) hours as needed for muscle spasms. 90 tablet 0   No facility-administered medications prior to visit.    Allergies  Allergen Reactions   Albumin  (Human) Anaphylaxis and Other (See Comments)   Doxycycline  Shortness Of Breath, Diarrhea and Palpitations    Chest pain, Fatigue and Diarrhea    Guaifenesin Anaphylaxis and Other (See Comments)    Stomach cramps   Polymyxin B-Trimethoprim Swelling    Eye drops made eyes swell   Pseudoephedrine Hives and Other (See Comments)    Stomach cramps   Codeine Hives, Itching, Other (See Comments) and Rash   Guaiacol Other (See Comments)    Hallucinations   Polymyxin B Other (See Comments), Rash and Swelling    Eye swelling   Statins Other (See Comments)    Muscle cramps  Muscle aches, Muscle cramps   Atorvastatin Other (See Comments)    Muscle aches and cramps  Other reaction(s): Other (See Comments)  Muscle Aches   Brimonidine  Tartrate Itching and Swelling   Gabapentin Other (See Comments)    Pt unsure of sensitivity  unk   Meloxicam Other (See Comments)    Pt unsure of sensitivity  Unknown reaction   Peppermint Flavoring Agent (Non-Screening) Other (See Comments)    Severe cramping   Pseudoephedrine-Guaifenesin Nausea And Vomiting    Stomach cramps   Pseudoephedrine-Guaifenesin Other (See Comments)    Stomach cramps   Rosuvastatin Calcium  Other (See Comments)    Muscle aches   Rosuvastatin  Calcium  Other (See Comments)    Muscle aches   Simvastatin Other (See Comments)    Muscle aches and cramps  Other reaction(s): Other (See Comments)   Tapentadol Other (See Comments)    Pt unsure of sensitivity  Unknown reaction   Tramadol  Nausea Only   Ciprofloxacin  Hives, Itching, Nausea Only, Rash and Other (See Comments)   Moxifloxacin Nausea Only and Other (See Comments)    Headaches, stomach cramps  Headaches, Stomach cramps   Rofecoxib Other (See Comments)    Stomach cramping    ROS See HPI    Objective:    Physical Exam  General: No acute distress. Awake and conversant.  Eyes:  Normal conjunctiva, anicteric. Round symmetric pupils.  ENT: Hearing grossly intact. No nasal discharge.  Neck: Neck is supple. No masses or thyromegaly.  Respiratory: CTAB. Respirations are non-labored. No wheezing.  Skin: Warm. No rashes or ulcers.  Psych: Alert and oriented. Cooperative, Appropriate mood and affect, Normal judgment.  CV: RRR. No murmur. No lower extremity edema.  MSK:  No clubbing or cyanosis.  Neuro:  CN II-XII grossly normal.     There were no vitals taken for this visit. Wt Readings from Last 3 Encounters:  04/03/24 179 lb (81.2 kg)  03/23/24 179 lb 6.4 oz (81.4 kg)  03/22/24 176 lb (79.8 kg)       Assessment & Plan:   Problem List Items Addressed This Visit     Anemia   Increase leafy greens, consider increased lean red meat and using cast iron cookware.       BPH with urinary obstruction   Stable on Flomax .  Follows with urology.      COPD (chronic obstructive pulmonary disease) (HCC)   Stable.  Using albuterol  in the morning 1 or 2 puffs depending on activity level. And he uses it prior to exercise.         Essential hypertension - Primary   Well controlled, no changes to meds. Encouraged heart healthy diet such as the DASH diet and exercise as tolerated.        GERD   Stable on omeprazole  20 mg twice daily.  Follows with GI.      Hx of  CABG   Patient is on aspirin , Repatha  and metoprolol .  Followed by cardiology.      Hyperglycemia   hgba1c acceptable, minimize simple carbs. Increase exercise as tolerated.         Hyperlipidemia, mixed   Tolerating Repatha . Encourage heart healthy diet such as MIND or DASH diet, increase exercise, avoid trans fats, simple carbohydrates and processed foods, consider a krill or fish or flaxseed oil cap daily.        Lymphocytic colitis   On budesonide .  Follows with GI.      Osteoporosis   Encouraged to get adequate exercise, calcium  and vitamin d  intake.      PAF (paroxysmal atrial fibrillation) (HCC)   Persistent atrial fibrillation (HCC)   Rate controlled.  Follows with cardiology.      Vitamin D  deficiency   Supplement and monitor.       I am having Spike L. Levora Duos maintain his omeprazole , Ranibizumab  (LUCENTIS  IO), latanoprost , nitroGLYCERIN , tamsulosin , aspirin  EC, PreserVision AREDS 2, furosemide , cholecalciferol , acetaminophen , diazepam , levalbuterol , [Paused] Eliquis , magnesium  oxide, diphenoxylate -atropine , budesonide , sodium polystyrene, metoprolol  tartrate, tiZANidine , and Repatha  [employer].  No orders of the defined types were placed in this encounter.

## 2024-05-10 ENCOUNTER — Encounter: Payer: Self-pay | Admitting: Student

## 2024-05-10 ENCOUNTER — Ambulatory Visit (INDEPENDENT_AMBULATORY_CARE_PROVIDER_SITE_OTHER): Admitting: Student

## 2024-05-10 VITALS — BP 130/80 | HR 60 | Resp 16 | Ht 64.0 in | Wt 175.0 lb

## 2024-05-10 DIAGNOSIS — K52832 Lymphocytic colitis: Secondary | ICD-10-CM

## 2024-05-10 DIAGNOSIS — E782 Mixed hyperlipidemia: Secondary | ICD-10-CM

## 2024-05-10 DIAGNOSIS — N138 Other obstructive and reflux uropathy: Secondary | ICD-10-CM | POA: Diagnosis not present

## 2024-05-10 DIAGNOSIS — E559 Vitamin D deficiency, unspecified: Secondary | ICD-10-CM | POA: Diagnosis not present

## 2024-05-10 DIAGNOSIS — J449 Chronic obstructive pulmonary disease, unspecified: Secondary | ICD-10-CM

## 2024-05-10 DIAGNOSIS — K219 Gastro-esophageal reflux disease without esophagitis: Secondary | ICD-10-CM | POA: Diagnosis not present

## 2024-05-10 DIAGNOSIS — N401 Enlarged prostate with lower urinary tract symptoms: Secondary | ICD-10-CM | POA: Diagnosis not present

## 2024-05-10 DIAGNOSIS — R739 Hyperglycemia, unspecified: Secondary | ICD-10-CM

## 2024-05-10 DIAGNOSIS — I480 Paroxysmal atrial fibrillation: Secondary | ICD-10-CM

## 2024-05-10 DIAGNOSIS — I4819 Other persistent atrial fibrillation: Secondary | ICD-10-CM | POA: Diagnosis not present

## 2024-05-10 DIAGNOSIS — M810 Age-related osteoporosis without current pathological fracture: Secondary | ICD-10-CM | POA: Diagnosis not present

## 2024-05-10 DIAGNOSIS — I10 Essential (primary) hypertension: Secondary | ICD-10-CM | POA: Diagnosis not present

## 2024-05-10 DIAGNOSIS — Z951 Presence of aortocoronary bypass graft: Secondary | ICD-10-CM

## 2024-05-10 DIAGNOSIS — D649 Anemia, unspecified: Secondary | ICD-10-CM

## 2024-05-11 ENCOUNTER — Encounter: Payer: Self-pay | Admitting: Internal Medicine

## 2024-05-11 ENCOUNTER — Ambulatory Visit: Attending: Internal Medicine | Admitting: Internal Medicine

## 2024-05-11 VITALS — BP 142/80 | HR 59 | Ht 64.0 in | Wt 175.0 lb

## 2024-05-11 DIAGNOSIS — I4819 Other persistent atrial fibrillation: Secondary | ICD-10-CM | POA: Insufficient documentation

## 2024-05-11 NOTE — Patient Instructions (Signed)
 Medication Instructions:  Your physician recommends that you continue on your current medications as directed. Please refer to the Current Medication list given to you today.  *If you need a refill on your cardiac medications before your next appointment, please call your pharmacy*  Lab Work: None ordered.  You may go to any [facility] Location for your lab work:  [facility] - [address] ([facility]) - [address] [phone] [address]  [place] - [address]   [place]  - [name]   [place] - [address] - [address] [employer]  - [address] [place] - [address] ([facility]   If you have labs (blood work) drawn today and your tests are completely normal, you will receive your results only by: [employer] (if you have MyChart)  If you have any lab test that is abnormal or we need to change your treatment, we will call you or send a MyChart message to review the results.  Testing/Procedures: None ordered.  Follow-Up: At [facility], you and your health needs are our priority.  As part of our continuing mission to provide you with exceptional heart care, we have created designated Provider Care Teams.  These Care Teams include your primary Cardiologist (physician) and Advanced Practice Providers (APPs -  Physician Assistants and Nurse Practitioners) who all work together to provide you with the care you need, when you need it.  Your next appointment:   1 year(s)  The format for your next appointment:   In Person  Provider:   Donnice Primus, MD or one of the following Advanced Practice Providers on your designated Care Team:   Charlies Arthur, [place] Ozell Jodie Passey, [place] Leotis Barrack, NP  Note: Remote monitoring is used to monitor your Pacemaker/ ICD from home. This monitoring reduces the number of office visits required to check  your device to one time per year. It allows us  to keep an eye on the functioning of your device to ensure it is working properly.

## 2024-05-11 NOTE — Progress Notes (Signed)
 HPI [name] returns today for followup. He is a pleasant 78 yo man with CAD s/p CABG, PAF now persistent since stopping his propafenone . He has a h/o prostate CA and has undergone surgery, chemo and XRT. He is improving. He checks his HR with a pulse ox and his bp cuff. He has been sedentary. He has undergone spine surgery and is improved. The patient's sob is at baseline and class 2.  He did not have angina prior to his CABG. He does have a h/o stroke several years ago. He has non-cardiac chest pain.  Allergies  Allergen Reactions   Albumin  (Human) Anaphylaxis and Other (See Comments)   Doxycycline  Shortness Of Breath, Diarrhea and Palpitations    Chest pain, Fatigue and Diarrhea    Guaifenesin Anaphylaxis and Other (See Comments)    Stomach cramps   Polymyxin B-Trimethoprim Swelling    Eye drops made eyes swell   Pseudoephedrine Hives and Other (See Comments)    Stomach cramps   Codeine Hives, Itching, Other (See Comments) and Rash   Guaiacol Other (See Comments)    Hallucinations   Polymyxin B Other (See Comments), Rash and Swelling    Eye swelling   Statins Other (See Comments)    Muscle cramps  Muscle aches, Muscle cramps   Atorvastatin Other (See Comments)    Muscle aches and cramps  Other reaction(s): Other (See Comments)  Muscle Aches   Brimonidine  Tartrate Itching and Swelling   Gabapentin Other (See Comments)    Pt unsure of sensitivity  unk   Meloxicam Other (See Comments)    Pt unsure of sensitivity  Unknown reaction   Peppermint Flavoring Agent (Non-Screening) Other (See Comments)    Severe cramping   Pseudoephedrine-Guaifenesin Nausea And Vomiting    Stomach cramps   Pseudoephedrine-Guaifenesin Other (See Comments)    Stomach cramps   Rosuvastatin Calcium  Other (See Comments)    Muscle aches   Rosuvastatin Calcium  Other (See Comments)    Muscle aches   Simvastatin Other (See Comments)    Muscle aches and cramps  Other reaction(s): Other  (See Comments)   Tapentadol Other (See Comments)    Pt unsure of sensitivity  Unknown reaction   Tramadol  Nausea Only   Ciprofloxacin  Hives, Itching, Nausea Only, Rash and Other (See Comments)   Moxifloxacin Nausea Only and Other (See Comments)    Headaches, stomach cramps  Headaches, Stomach cramps   Rofecoxib Other (See Comments)    Stomach cramping     Current Outpatient Medications  Medication Sig Dispense Refill   acetaminophen  (TYLENOL ) 500 MG tablet Take 1,000 mg by mouth at bedtime.     aspirin  EC 81 MG EC tablet Take 1 tablet (81 mg total) by mouth daily. Swallow whole. 30 tablet 11   budesonide  (ENTOCORT EC ) 3 MG 24 hr capsule Take 2 capsules (6 mg total) by mouth daily. 180 capsule 3   cholecalciferol  (VITAMIN D3) 25 MCG (1000 UNIT) tablet Take 10,000 Units by mouth 2 (two) times a week. Monday and Friday     diazepam  (VALIUM ) 5 MG tablet TAKE 1/2 TO 2 TABLETS(2.5 TO 10 MG) BY MOUTH EVERY 12 HOURS AS NEEDED FOR ANXIETY 30 tablet 2   diphenoxylate -atropine  (LOMOTIL ) 2.5-0.025 MG tablet Take 1 tablet by mouth 4 (four) times daily as needed for diarrhea or loose stools.     ELIQUIS  5 MG TABS tablet TAKE 1 TABLET BY MOUTH TWICE  DAILY 180 tablet 3   Evolocumab  (REPATHA  SURECLICK) 140  MG/ML SOAJ Inject 140 mg into the skin every 14 (fourteen) days. 6 mL 1   furosemide  (LASIX ) 20 MG tablet TAKE 1 TABLET BY MOUTH DAILY AS  NEEDED 90 tablet 3   latanoprost  (XALATAN ) 0.005 % ophthalmic solution Place 1 drop into the left eye at bedtime.      levalbuterol  (XOPENEX  HFA) 45 MCG/ACT inhaler Inhale 2 puffs into the lungs every 4 (four) hours as needed for wheezing or shortness of breath. 15 g 5   magnesium  oxide (MAG-OX) 400 (240 Mg) MG tablet TAKE 1 TABLET(400 MG) BY MOUTH THREE TIMES DAILY 270 tablet 1   metoprolol  tartrate (LOPRESSOR ) 100 MG tablet TAKE 1 TABLET BY MOUTH TWICE  DAILY 180 tablet 0   Multiple Vitamins-Minerals (PRESERVISION AREDS 2) CAPS Take 1 capsule by mouth daily.      nitroGLYCERIN  (NITROSTAT ) 0.4 MG SL tablet Place 1 tablet (0.4 mg total) under the tongue every 5 (five) minutes as needed for chest pain. 30 tablet 0   omeprazole  (PRILOSEC  OTC) 20 MG tablet Take 20 mg by mouth every morning.     Ranibizumab  (LUCENTIS  IO) Inject 1 Dose into the eye every 6 (six) weeks. Bilateral eye by Dr Craig Greven     sodium polystyrene (KAYEXALATE ) powder Take by mouth daily as needed (hyperkalemia). 454 g 1   tamsulosin  (FLOMAX ) 0.4 MG CAPS capsule Take 0.4 mg by mouth 2 (two) times daily.     tiZANidine  (ZANAFLEX ) 4 MG tablet Take 1 tablet (4 mg total) by mouth every 8 (eight) hours as needed for muscle spasms. 90 tablet 0   No current facility-administered medications for this visit.     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; Gleason 5+5; PSA 1.8 --> Tx'd with XRT (12/10/21 - 02/18/22) + LT-ADT; XRT course truncated due to severe radiation colitis   Allergy    Anemia    Anxiety    a.) on BZO PRN (diazepam )   Aortic atherosclerosis (HCC)    Bilateral carotid artery disease (HCC) 03/24/2021   a.) carotid doppler 03/24/2021: 1-39% BICA   BPH with urinary obstruction    CAD (coronary artery disease) 03/26/2011   a.) cCTA 03/26/2011: Ca2+ 505 (pRCA + LM); b.) LHC 04/01/2011: 40-50% oLM, 60-70% m-dLAD, 30% mD2, 60% pRCA - med mgmt; c.) LHC 10/17/2022L 90% oLM, 60% o-pLCx, 95% pRCA, 70% dRCA, 70% RPDA --> CVTS consult; d.) s/p 4v CABG 07/22/2021   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   Chronic diastolic CHF (congestive heart failure) (HCC) 01/14/2017   a.) TTE 01/14/2017: TTE 01/14/2017: EF 55-60%, sev LA dil, triv MR, G1DD; b.) TTE 03/23/2018: EF 55-60%, mod LVH, sev LA dil, mod-sev RA dil; c.) TTE 04/17/2020: EF 60-65%, mod asym ant seg LVH, mild LA dil, mild MR, G1DD; d.) TTE 03/25/2021: EF  50-55%, AoV sclerosis, triv MR   CKD (chronic kidney disease), stage III (HCC)    COPD (chronic obstructive pulmonary disease) (HCC)    DDD (degenerative disc disease), cervical    a.) s/p ACDF C5-C7 06/08/2002   DOE (dyspnea on exertion)    Eczema    Emphysema of lung (HCC)    Family history of adverse reaction to anesthesia    a.) PONV and postoperative delirium/agitation in 1st degree relatives (sisters)   GERD (gastroesophageal reflux disease)    Glaucoma, left eye  Heart murmur    Hemorrhoids    History of adenomatous polyp of colon    History of bilateral cataract extraction 2021   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 [place]--- dx by biopsy 01-28-2021   Macular degeneration of both eyes    followed by dr c. cheree--- bilateral eye injection every 6 wks   NSVT (nonsustained ventricular tachycardia) (HCC) 03/17/2022   a.) Zio patch 03/17/2022: 3 runs with fastest lasting 10 beats at rate of 190 bpm and longest lasting 16 beats at rate of 121 bpm   On apixaban  therapy    Osteoporosis 05/31/2016   PAF (paroxysmal atrial fibrillation) (HCC) 03/2012   a.) CHA2DS2VASc = 7 (age x2, CHF, HTN, TIA x2, vascular disease history);  b.) s/p DCCV 04/25/2018 (150J x 1 --> SB); c.) rate/rhythm maintained on oral metoprolol  tartrate; chronically anticoagulated with apixaban    Postoperative ileus (HCC) 07/2021    a.) following CABG procedure   RBBB (right bundle branch block)    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   Past Surgical History:  Procedure Laterality Date   ANTERIOR CERVICAL DECOMP/DISCECTOMY FUSION  06/08/2002   APPENDECTOMY  1953   CARDIAC CATHETERIZATION Left 04/01/2011   CARDIOVERSION N/A 04/25/2018   Procedure: CARDIOVERSION;  Surgeon: Raford Riggs, MD;  Location[facility] ENDOSCOPY;  Service: Cardiovascular;  Laterality: N/A;   CATARACT EXTRACTION W/ INTRAOCULAR LENS IMPLANT Bilateral 2021   COLONOSCOPY WITH ESOPHAGOGASTRODUODENOSCOPY (EGD)  01/28/2021   by fzinqq   CORONARY ARTERY BYPASS GRAFT N/A 07/22/2021   Procedure: 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, ON PUMP, USING LEFT INTERNAL MAMMARY ARTERY AND ENDOSCOPICALLY HARVESTED RIGHT GREATER SAPHENOUS VEIN;  Surgeon: Kerrin Elspeth BROCKS, MD;  Location: [facility] OR;  Service: Open Heart Surgery;  Laterality: N/A;   ENDOVEIN HARVEST OF GREATER SAPHENOUS VEIN  07/22/2021   Procedure: ENDOVEIN HARVEST OF GREATER SAPHENOUS VEIN;  Surgeon: Kerrin Elspeth BROCKS, MD;  Location: [facility] OR;  Service: Open Heart Surgery;;   EXTRACORPOREAL SHOCK WAVE LITHOTRIPSY     x2  1990s   EYE SURGERY     FOOT SURGERY Right 1970   calcification removed from top of foot   GOLD SEED IMPLANT N/A 11/28/2021   Procedure: GOLD SEED IMPLANT;  Surgeon: Nieves Cough, MD;  Location: [facility];  Service: Urology;  Laterality: N/A;   KNEE ARTHROSCOPY Right 1991   LAPAROSCOPIC CHOLECYSTECTOMY  2000   LEFT HEART CATH AND CORONARY ANGIOGRAPHY N/A 07/21/2021  Procedure: LEFT HEART CATH AND CORONARY ANGIOGRAPHY;  Surgeon: Court Dorn PARAS, MD;  Location: [facility] INVASIVE CV LAB;   Service: Cardiovascular;  Laterality: N/A;   LUMBAR LAMINECTOMY/DECOMPRESSION MICRODISCECTOMY N/A 05/17/2023   Procedure: L4-5 DECOMPRESSION;  Surgeon: Clois Fret, MD;  Location: [facility] ORS;  Service: Neurosurgery;  Laterality: N/A;   mose Left    MOSE   POSTERIOR CERVICAL FUSION/FORAMINOTOMY N/A 01/10/2024   Procedure: C4-7 POSTERIOR SPINAL INSTRUMENTATION;  Surgeon: Clois Fret, MD;  Location: [facility] ORS;  Service: Neurosurgery;  Laterality: N/A;   POSTERIOR CERVICAL LAMINECTOMY N/A 01/10/2024   Procedure: C4-5 LAMINECTOMY;  Surgeon: Clois Fret, MD;  Location: [facility] ORS;  Service: Neurosurgery;  Laterality: N/A;   SPACE OAR INSTILLATION N/A 11/28/2021   Procedure: SPACE OAR INSTILLATION;  Surgeon: Nieves Cough, MD;  Location: [facility];  Service: Urology;  Laterality: N/A;   TEE WITHOUT CARDIOVERSION N/A 07/22/2021   Procedure: TRANSESOPHAGEAL ECHOCARDIOGRAM (TEE);  Surgeon: Kerrin Elspeth BROCKS, MD;  Location: [facility] OR;  Service: Open Heart Surgery;  Laterality: N/A;   TOTAL KNEE ARTHROPLASTY Right 08/14/2009   @[facility]    TRANSURETHRAL RESECTION OF PROSTATE N/A 07/17/2021   Procedure: TRANSURETHRAL RESECTION OF THE PROSTATE (TURP);  Surgeon: Matilda Senior, MD;  Location: [facility];  Service: Urology;  Laterality: N/A;  1 HR     Family History  Problem Relation Age of Onset   Heart disease Mother    Diabetes Mother    Cirrhosis Mother    Emphysema Mother        never smoked but 2nd hand through her spouse   Hypertension Mother    Macular degeneration Mother    Heart disease Father    Cancer Father        prostate   Hyperlipidemia Father    Hypertension Father    Varicose Veins Father    Heart attack Father    Peripheral vascular disease Father    Heart disease Sister    Arthritis Sister    Hyperlipidemia Sister    Obesity Sister    Macular degeneration Sister    Heart disease Brother        5 stents   Hyperlipidemia  Brother    Macular degeneration Maternal Grandfather    Cirrhosis Sister    Obesity Sister    Arthritis Sister    Heart disease Sister    Obesity Sister    Liver disease Other    Prostate cancer Other    Coronary artery disease Other    Colon cancer Neg Hx    Esophageal cancer Neg Hx    Rectal cancer Neg Hx    Stomach cancer Neg Hx      Social History   Socioeconomic History   Marital status: Married    Spouse name: Romero   Number of children: 0   Years of education: Not on file   Highest education level: Associate degree: academic program  Occupational History   Occupation: retired    Associate Professor: RETIRED  Tobacco Use   Smoking status: Former    Current packs/day: 0.00    Average packs/day: 0.5 packs/day for 50.0 years (25.0 ttl pk-yrs)    Types: Cigarettes    Start date: 05/02/1959    Quit date: 05/01/2009    Years since quitting: 15.0   Smokeless tobacco: Former    Types: Chew    Quit date: 1995   Tobacco comments:    Former smoker 01/27/22  Vaping Use   Vaping status: Never Used  Substance and Sexual Activity   Alcohol use: Yes    Alcohol/week: 2.0 - 3.0 standard drinks of alcohol    Types: 1 - 2 Glasses of wine, 1 Cans of beer per week    Comment: 1-2 glasses wine/beer daily   Drug use: Never   Sexual activity: Yes    Comment: lives with wife, no dietary restrictions  Other Topics Concern   Not on file  Social History Narrative   Lives with wife   Social Drivers of Health   Financial Resource Strain: Low Risk  (05/03/2024)   Overall Financial Resource Strain (CARDIA)    Difficulty of Paying Living Expenses: Not hard at all  Food Insecurity: No Food Insecurity (05/03/2024)   Hunger Vital Sign    Worried About Running Out of Food in the Last Year: Never true    Ran Out of Food in the Last Year: Never true  Transportation Needs: No Transportation Needs (05/03/2024)   PRAPARE - Administrator, Civil Service (Medical): No    Lack of  Transportation (Non-Medical): No  Physical Activity: Insufficiently Active (05/03/2024)   Exercise Vital Sign    Days of Exercise per Week: 2 days    Minutes of Exercise per Session: 30 min  Stress: No Stress Concern Present (05/03/2024)   [employer] of Occupational Health - Occupational Stress Questionnaire    Feeling of Stress: Not at all  Social Connections: Moderately Isolated (05/03/2024)   Social Connection and Isolation Panel    Frequency of Communication with Friends and Family: Twice a week    Frequency of Social Gatherings with Friends and Family: Once a week    Attends Religious Services: Never    Database administrator or Organizations: No    Attends Engineer, structural: Not on file    Marital Status: Married  Catering manager Violence: Not At Risk (03/23/2024)   Humiliation, Afraid, Rape, and Kick questionnaire    Fear of Current or Ex-Partner: No    Emotionally Abused: No    Physically Abused: No    Sexually Abused: No     BP (!) 142/80   Pulse (!) 59   Ht 5' 4 (1.626 m)   Wt 175 lb (79.4 kg)   SpO2 98%   BMI 30.04 kg/m   Physical Exam:  Well appearing NAD HEENT: Unremarkable Neck:  No JVD, no thyromegally Lymphatics:  No adenopathy Back:  No CVA tenderness Lungs:  Clear with no wheezes HEART:  Regular rate rhythm, no murmurs, no rubs, no clicks Abd:  soft, positive bowel sounds, no organomegally, no rebound, no guarding Ext:  2 plus pulses, no edema, no cyanosis, no clubbing Skin:  No rashes no nodules Neuro:  CN II through XII intact, motor grossly intact  Assess/Plan:  Atrial fib - his VR is reasonably well controlled. Continue current meds. CAD - he denies anginal symptoms. He still has non-exertional chest pain. He does have sob. HTN - his bp is fairly well controlled. Coags - he will continue systemic anti-coag with eliquis .     Danelle Waddell COME

## 2024-05-15 ENCOUNTER — Other Ambulatory Visit

## 2024-05-15 ENCOUNTER — Ambulatory Visit: Payer: Self-pay | Admitting: Student

## 2024-05-15 DIAGNOSIS — I10 Essential (primary) hypertension: Secondary | ICD-10-CM | POA: Diagnosis not present

## 2024-05-15 DIAGNOSIS — E559 Vitamin D deficiency, unspecified: Secondary | ICD-10-CM

## 2024-05-15 DIAGNOSIS — D509 Iron deficiency anemia, unspecified: Secondary | ICD-10-CM

## 2024-05-15 LAB — CBC WITH DIFFERENTIAL/PLATELET
Basophils Absolute: 0.1 K/uL (ref 0.0–0.1)
Basophils Relative: 1.3 % (ref 0.0–3.0)
Eosinophils Absolute: 0.1 K/uL (ref 0.0–0.7)
Eosinophils Relative: 1.2 % (ref 0.0–5.0)
HCT: 37.3 % — ABNORMAL LOW (ref 39.0–52.0)
Hemoglobin: 12.6 g/dL — ABNORMAL LOW (ref 13.0–17.0)
Lymphocytes Relative: 16.2 % (ref 12.0–46.0)
Lymphs Abs: 1.4 K/uL (ref 0.7–4.0)
MCHC: 33.7 g/dL (ref 30.0–36.0)
MCV: 87.9 fl (ref 78.0–100.0)
Monocytes Absolute: 0.7 K/uL (ref 0.1–1.0)
Monocytes Relative: 8.1 % (ref 3.0–12.0)
Neutro Abs: 6.2 K/uL (ref 1.4–7.7)
Neutrophils Relative %: 73.2 % (ref 43.0–77.0)
Platelets: 221 K/uL (ref 150.0–400.0)
RBC: 4.25 Mil/uL (ref 4.22–5.81)
RDW: 15.5 % (ref 11.5–15.5)
WBC: 8.4 K/uL (ref 4.0–10.5)

## 2024-05-16 ENCOUNTER — Other Ambulatory Visit: Payer: Self-pay | Admitting: Student

## 2024-05-16 ENCOUNTER — Ambulatory Visit: Payer: Self-pay | Admitting: Student

## 2024-05-16 DIAGNOSIS — E875 Hyperkalemia: Secondary | ICD-10-CM

## 2024-05-16 LAB — COMPLETE METABOLIC PANEL WITHOUT GFR
AG Ratio: 1.4 (calc) (ref 1.0–2.5)
ALT: 11 U/L (ref 9–46)
AST: 17 U/L (ref 10–35)
Albumin: 4.3 g/dL (ref 3.6–5.1)
Alkaline phosphatase (APISO): 69 U/L (ref 35–144)
BUN: 21 mg/dL (ref 7–25)
CO2: 23 mmol/L (ref 20–32)
Calcium: 10.1 mg/dL (ref 8.6–10.3)
Chloride: 100 mmol/L (ref 98–110)
Creat: 0.93 mg/dL (ref 0.70–1.28)
Globulin: 3 g/dL (ref 1.9–3.7)
Glucose, Bld: 93 mg/dL (ref 65–99)
Potassium: 5.4 mmol/L — ABNORMAL HIGH (ref 3.5–5.3)
Sodium: 137 mmol/L (ref 135–146)
Total Bilirubin: 0.4 mg/dL (ref 0.2–1.2)
Total Protein: 7.3 g/dL (ref 6.1–8.1)

## 2024-05-16 LAB — VITAMIN D 25 HYDROXY (VIT D DEFICIENCY, FRACTURES): Vit D, 25-Hydroxy: 100 ng/mL (ref 30–100)

## 2024-05-16 LAB — LIPID PANEL
Cholesterol: 122 mg/dL (ref <200)
HDL: 63 mg/dL (ref >=40)
LDL Cholesterol (Calc): 38 mg/dL
Non-HDL Cholesterol (Calc): 59 mg/dL (ref <130)
Total CHOL/HDL Ratio: 1.9 (calc) (ref <5.0)
Triglycerides: 126 mg/dL (ref <150)

## 2024-05-16 LAB — TSH: TSH: 1.55 m[IU]/L (ref 0.40–4.50)

## 2024-05-16 MED ORDER — SODIUM POLYSTYRENE SULFONATE PO POWD: Freq: Every day | ORAL | 1 refills | Status: AC | PRN

## 2024-05-16 NOTE — Progress Notes (Signed)
 Pt notified of lab results and scheduled for next week for recheck

## 2024-05-23 ENCOUNTER — Other Ambulatory Visit (INDEPENDENT_AMBULATORY_CARE_PROVIDER_SITE_OTHER)

## 2024-05-23 ENCOUNTER — Ambulatory Visit: Payer: Self-pay | Admitting: Student

## 2024-05-23 DIAGNOSIS — E875 Hyperkalemia: Secondary | ICD-10-CM

## 2024-05-23 LAB — COMPREHENSIVE METABOLIC PANEL WITH GFR
ALT: 10 U/L (ref 0–53)
AST: 16 U/L (ref 0–37)
Albumin: 4 g/dL (ref 3.5–5.2)
Alkaline Phosphatase: 57 U/L (ref 39–117)
BUN: 28 mg/dL — ABNORMAL HIGH (ref 6–23)
CO2: 28 meq/L (ref 19–32)
Calcium: 9.4 mg/dL (ref 8.4–10.5)
Chloride: 102 meq/L (ref 96–112)
Creatinine, Ser: 0.93 mg/dL (ref 0.40–1.50)
GFR: 79.29 mL/min (ref >60.00)
Glucose, Bld: 103 mg/dL — ABNORMAL HIGH (ref 70–99)
Potassium: 5.2 meq/L — ABNORMAL HIGH (ref 3.5–5.1)
Sodium: 137 meq/L (ref 135–145)
Total Bilirubin: 0.5 mg/dL (ref 0.2–1.2)
Total Protein: 6.6 g/dL (ref 6.0–8.3)

## 2024-05-27 ENCOUNTER — Other Ambulatory Visit: Payer: Self-pay | Admitting: Internal Medicine

## 2024-06-06 DIAGNOSIS — C61 Malignant neoplasm of prostate: Secondary | ICD-10-CM | POA: Diagnosis not present

## 2024-06-11 ENCOUNTER — Other Ambulatory Visit: Payer: Self-pay | Admitting: Family Medicine

## 2024-06-11 DIAGNOSIS — R790 Abnormal level of blood mineral: Secondary | ICD-10-CM

## 2024-06-13 DIAGNOSIS — R42 Dizziness and giddiness: Secondary | ICD-10-CM | POA: Diagnosis not present

## 2024-06-13 DIAGNOSIS — H903 Sensorineural hearing loss, bilateral: Secondary | ICD-10-CM | POA: Diagnosis not present

## 2024-06-13 DIAGNOSIS — H9313 Tinnitus, bilateral: Secondary | ICD-10-CM | POA: Diagnosis not present

## 2024-06-13 DIAGNOSIS — H6122 Impacted cerumen, left ear: Secondary | ICD-10-CM | POA: Diagnosis not present

## 2024-06-13 DIAGNOSIS — H93292 Other abnormal auditory perceptions, left ear: Secondary | ICD-10-CM | POA: Diagnosis not present

## 2024-06-15 DIAGNOSIS — H35363 Drusen (degenerative) of macula, bilateral: Secondary | ICD-10-CM | POA: Diagnosis not present

## 2024-06-15 DIAGNOSIS — H353231 Exudative age-related macular degeneration, bilateral, with active choroidal neovascularization: Secondary | ICD-10-CM | POA: Diagnosis not present

## 2024-06-15 DIAGNOSIS — H35372 Puckering of macula, left eye: Secondary | ICD-10-CM | POA: Diagnosis not present

## 2024-06-23 DIAGNOSIS — Z23 Encounter for immunization: Secondary | ICD-10-CM | POA: Diagnosis not present

## 2024-06-26 DIAGNOSIS — E230 Hypopituitarism: Secondary | ICD-10-CM | POA: Diagnosis not present

## 2024-06-26 DIAGNOSIS — Z8546 Personal history of malignant neoplasm of prostate: Secondary | ICD-10-CM | POA: Diagnosis not present

## 2024-06-26 DIAGNOSIS — N393 Stress incontinence (female) (male): Secondary | ICD-10-CM | POA: Diagnosis not present

## 2024-06-28 ENCOUNTER — Other Ambulatory Visit (HOSPITAL_BASED_OUTPATIENT_CLINIC_OR_DEPARTMENT_OTHER): Payer: Self-pay | Admitting: Urology

## 2024-06-28 DIAGNOSIS — E230 Hypopituitarism: Secondary | ICD-10-CM

## 2024-07-11 DIAGNOSIS — E230 Hypopituitarism: Secondary | ICD-10-CM | POA: Diagnosis not present

## 2024-07-18 DIAGNOSIS — Z23 Encounter for immunization: Secondary | ICD-10-CM | POA: Diagnosis not present

## 2024-07-20 DIAGNOSIS — H353231 Exudative age-related macular degeneration, bilateral, with active choroidal neovascularization: Secondary | ICD-10-CM | POA: Diagnosis not present

## 2024-07-20 DIAGNOSIS — H35361 Drusen (degenerative) of macula, right eye: Secondary | ICD-10-CM | POA: Diagnosis not present

## 2024-07-20 DIAGNOSIS — H35372 Puckering of macula, left eye: Secondary | ICD-10-CM | POA: Diagnosis not present

## 2024-09-06 ENCOUNTER — Ambulatory Visit: Admitting: Student

## 2024-09-06 ENCOUNTER — Encounter: Payer: Self-pay | Admitting: Student

## 2024-09-06 VITALS — BP 144/82 | HR 70 | Ht 64.0 in | Wt 175.2 lb

## 2024-09-06 DIAGNOSIS — I10 Essential (primary) hypertension: Secondary | ICD-10-CM | POA: Diagnosis not present

## 2024-09-06 DIAGNOSIS — N138 Other obstructive and reflux uropathy: Secondary | ICD-10-CM | POA: Diagnosis not present

## 2024-09-06 DIAGNOSIS — K52832 Lymphocytic colitis: Secondary | ICD-10-CM | POA: Diagnosis not present

## 2024-09-06 DIAGNOSIS — I4819 Other persistent atrial fibrillation: Secondary | ICD-10-CM | POA: Diagnosis not present

## 2024-09-06 DIAGNOSIS — I509 Heart failure, unspecified: Secondary | ICD-10-CM

## 2024-09-06 DIAGNOSIS — N401 Enlarged prostate with lower urinary tract symptoms: Secondary | ICD-10-CM

## 2024-09-06 DIAGNOSIS — E875 Hyperkalemia: Secondary | ICD-10-CM | POA: Diagnosis not present

## 2024-09-06 DIAGNOSIS — K219 Gastro-esophageal reflux disease without esophagitis: Secondary | ICD-10-CM

## 2024-09-06 DIAGNOSIS — J449 Chronic obstructive pulmonary disease, unspecified: Secondary | ICD-10-CM | POA: Diagnosis not present

## 2024-09-06 DIAGNOSIS — D649 Anemia, unspecified: Secondary | ICD-10-CM

## 2024-09-06 DIAGNOSIS — E782 Mixed hyperlipidemia: Secondary | ICD-10-CM

## 2024-09-06 DIAGNOSIS — I5032 Chronic diastolic (congestive) heart failure: Secondary | ICD-10-CM

## 2024-09-06 NOTE — Assessment & Plan Note (Signed)
 Stable on omeprazole  20 mg twice daily.  Follows with GI.

## 2024-09-06 NOTE — Assessment & Plan Note (Signed)
 Stable on Flomax.  Follows with urology.

## 2024-09-06 NOTE — Progress Notes (Signed)
 Subjective:     Patient ID: Ryan [name], male    DOB: [birth year], 78 y.o.   MRN: [id number]  No chief complaint on file.   HPI  Ryan [name] is a 78 year old patient presents for follow-up chronic conditions.  PMHx-adenocarcinoma prostate, aortic atherosclerosis, HTN CAD, Hx CABG, BPH, CHF, CKD stage III, COPD, eczema, Hx TIA, osteoporosis, paroxysmal atrial fibrillation, s/p CABG x 4, vitamin D  deficiency.  Overall he is feeling well.  He has completed radiation therapy, follows with Urology.   Patient Care Team: Domenica Harlene LABOR, MD as PCP - General (Family Medicine) Waddell Danelle ORN, MD as PCP - Cardiology (Cardiology) Alix Charleston, MD as Consulting Physician (Neurosurgery) Waddell Danelle ORN, MD as Consulting Physician (Cardiology) Cheree Banks, MD as Referring Physician (Ophthalmology) Vertell Pont, RN as Oncology Nurse Navigator Matilda Senior, MD as Consulting Physician (Urology) Patrcia Cough, MD as Consulting Physician (Radiation Oncology) Causey, Morna Pickle, NP as Nurse Practitioner (Hematology and Oncology) Starla Wendelyn BIRCH, RN as Registered Nurse   A.FIB-- On Eliquis  5 mg BID.  HTN- Followed by Cardiology-  Metoprolol  100 mg twice daily BP at home: 130s/70s  BP Readings from Last 3 Encounters:  09/06/24 (!) 144/82  05/11/24 (!) 142/80  05/10/24 130/80     CHF Lasix  20 mg take 1 tablet by mouth daily as needed  HLD-Repatha  injection every 2 weeks  GERD-Omeprazole  20 mg twice daily  BPH-tamsulosin  (Flomax ) 0.4 mg by mouth twice daily Hx prostate Cancer- follows with urology- Dr. Shane  Lymphocytic Colitis, Hx Crohnes Dz followed by Gastroenterology Budesonide  6 mg daily Per GI- Lomotil  on demand if breakthrough   Lumbar stenosis-follows with neurosurgery- Dr. Katrina Had lumbar laminectomy/decompression 05/25/2023 takes tizanidine  4 mg by mouth every 8 hours for muscle spasms. Reports only taking at night for sleep. Cervical  decompression in April 2025 Per Neuro Sx- PT orders to [facility] for evaluation and dry needling.- he has completed. Pt said was not helpful.  Ophthalmology- Ranibzumab (LUCENTIS ) injections- right eye  Hx Psoriasis/ Eczema- follows Dermatology- Dr. Dyana- [facility] Uses clobetasol  as needed for flares Reports MOSE procedure last year,   Vit D deficiency-vitamin D3 1000 units daily   Patient denies fever, chills, SOB, CP, palpitations, dyspnea, edema, HA, vision changes, N/V/D, abdominal pain, urinary symptoms, rash, weight changes, and recent illness or hospitalizations.   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; Gleason 5+5; PSA 1.8 --> Tx'd with XRT (12/10/21 - 02/18/22) + LT-ADT; XRT course truncated due to severe radiation colitis   Allergy    Anemia    Anxiety    a.) on BZO PRN (diazepam )   Aortic atherosclerosis    Bilateral carotid artery disease 03/24/2021   a.) carotid doppler 03/24/2021: 1-39% BICA   BPH with urinary obstruction    CAD (coronary artery disease) 03/26/2011   a.) cCTA 03/26/2011: Ca2+ 505 (pRCA + LM); b.) LHC 04/01/2011: 40-50% oLM, 60-70% m-dLAD, 30% mD2, 60% pRCA - med mgmt; c.) LHC 10/17/2022L 90% oLM, 60% o-pLCx, 95% pRCA, 70% dRCA, 70% RPDA --> CVTS consult; d.) s/p 4v CABG 07/22/2021   Cerebrovascular small vessel  disease 03/23/2021   Chronic diastolic CHF (congestive heart failure) (HCC) 01/14/2017   a.) TTE 01/14/2017: TTE 01/14/2017: EF 55-60%, sev LA dil, triv MR, G1DD; b.) TTE 03/23/2018: EF 55-60%, mod LVH, sev LA dil, mod-sev RA dil; c.) TTE 04/17/2020: EF 60-65%, mod asym ant seg LVH, mild LA dil, mild MR, G1DD;  d.) TTE 03/25/2021: EF 50-55%, AoV sclerosis, triv MR   CKD (chronic kidney disease), stage III (HCC)    COPD (chronic obstructive pulmonary disease) (HCC)    DDD (degenerative disc disease), cervical    a.) s/p ACDF C5-C7 06/08/2002   DOE (dyspnea on exertion)    Eczema    Emphysema of lung (HCC)    Family history of adverse reaction to anesthesia    a.) PONV and postoperative delirium/agitation in 1st degree relatives (sisters)   GERD (gastroesophageal reflux disease)    Glaucoma, left eye    Heart murmur    Hemorrhoids    History of adenomatous polyp of colon    History of bilateral cataract extraction 2021   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 [place]--- dx by biopsy 01-28-2021   Macular degeneration of both eyes    followed by dr c. cheree--- bilateral eye injection every 6 wks   NSVT (nonsustained ventricular tachycardia) (HCC) 03/17/2022   a.) Zio patch 03/17/2022: 3 runs with fastest lasting 10 beats at rate of 190 bpm and longest lasting 16 beats at rate of 121 bpm   On apixaban  therapy    Osteoporosis 05/31/2016   PAF (paroxysmal atrial fibrillation) (HCC) 03/2012   a.) CHA2DS2VASc = 7 (age x2, CHF, HTN, TIA x2, vascular disease history);  b.) s/p DCCV 04/25/2018 (150J x 1 --> SB); c.) rate/rhythm maintained on oral metoprolol  tartrate; chronically anticoagulated with apixaban     Postoperative ileus (HCC) 07/2021   a.) following CABG procedure   RBBB (right bundle branch block)    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   a.) LIMA-LAD, SVG-OM1, sequential SVG-acute marginal-PDA   SNHL (sensorineural hearing loss)    Statin intolerance    Vision loss of left eye    macular degeneration   Vitamin D  deficiency     Past Surgical History:  Procedure Laterality Date   ANTERIOR CERVICAL DECOMP/DISCECTOMY FUSION  06/08/2002   APPENDECTOMY  1953   CARDIAC CATHETERIZATION Left 04/01/2011   CARDIOVERSION N/A 04/25/2018   Procedure: CARDIOVERSION;  Surgeon: Raford Riggs, MD;  Location: [facility] ENDOSCOPY;  Service: Cardiovascular;  Laterality: N/A;   CATARACT EXTRACTION W/ INTRAOCULAR LENS IMPLANT Bilateral 2021   COLONOSCOPY WITH ESOPHAGOGASTRODUODENOSCOPY (EGD)  01/28/2021   by fzinqq   CORONARY ARTERY BYPASS GRAFT N/A 07/22/2021   Procedure: 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, ON PUMP, USING LEFT INTERNAL MAMMARY ARTERY AND ENDOSCOPICALLY HARVESTED RIGHT GREATER SAPHENOUS  VEIN;  Surgeon: Kerrin Elspeth BROCKS, MD;  Location: [facility] OR;  Service: Open Heart Surgery;  Laterality: N/A;   ENDOVEIN HARVEST OF GREATER SAPHENOUS VEIN  07/22/2021   Procedure: ENDOVEIN HARVEST OF GREATER SAPHENOUS VEIN;  Surgeon: Kerrin Elspeth BROCKS, MD;  Location: [facility] OR;  Service: Open Heart Surgery;;   EXTRACORPOREAL SHOCK WAVE LITHOTRIPSY     x2  1990s   EYE SURGERY     FOOT SURGERY Right 1970   calcification removed from top of foot   GOLD SEED IMPLANT N/A 11/28/2021   Procedure: GOLD SEED IMPLANT;  Surgeon: Nieves Cough, MD;  Location[facility];  Service: Urology;  Laterality: N/A;   KNEE ARTHROSCOPY Right 1991   LAPAROSCOPIC CHOLECYSTECTOMY  2000   LEFT HEART CATH AND CORONARY ANGIOGRAPHY N/A 07/21/2021   Procedure: LEFT HEART CATH AND CORONARY ANGIOGRAPHY;  Surgeon: Court Dorn PARAS, MD;  Location: [facility] INVASIVE CV LAB;  Service: Cardiovascular;  Laterality: N/A;    LUMBAR LAMINECTOMY/DECOMPRESSION MICRODISCECTOMY N/A 05/17/2023   Procedure: L4-5 DECOMPRESSION;  Surgeon: Clois Fret, MD;  Location: [facility] ORS;  Service: Neurosurgery;  Laterality: N/A;   mose Left    MOSE   POSTERIOR CERVICAL FUSION/FORAMINOTOMY N/A 01/10/2024   Procedure: C4-7 POSTERIOR SPINAL INSTRUMENTATION;  Surgeon: Clois Fret, MD;  Location: [facility] ORS;  Service: Neurosurgery;  Laterality: N/A;   POSTERIOR CERVICAL LAMINECTOMY N/A 01/10/2024   Procedure: C4-5 LAMINECTOMY;  Surgeon: Clois Fret, MD;  Location: [facility] ORS;  Service: Neurosurgery;  Laterality: N/A;   SPACE OAR INSTILLATION N/A 11/28/2021   Procedure: SPACE OAR INSTILLATION;  Surgeon: Nieves Cough, MD;  Location: [facility];  Service: Urology;  Laterality: N/A;   TEE WITHOUT CARDIOVERSION N/A 07/22/2021   Procedure: TRANSESOPHAGEAL ECHOCARDIOGRAM (TEE);  Surgeon: Kerrin Elspeth BROCKS, MD;  Location: [facility] OR;  Service: Open Heart Surgery;  Laterality: N/A;   TOTAL KNEE ARTHROPLASTY Right 08/14/2009   @[facility]    TRANSURETHRAL RESECTION OF PROSTATE N/A 07/17/2021   Procedure: TRANSURETHRAL RESECTION OF THE PROSTATE (TURP);  Surgeon: Matilda Senior, MD;  Location: [facility];  Service: Urology;  Laterality: N/A;  1 HR    Family History  Problem Relation Age of Onset   Heart disease Mother    Diabetes Mother    Cirrhosis Mother    Emphysema Mother        never smoked but 2nd hand through her spouse   Hypertension Mother    Macular degeneration Mother    Heart disease Father    Cancer Father        prostate   Hyperlipidemia Father    Hypertension Father    Varicose Veins Father    Heart attack Father    Peripheral vascular disease Father    Heart disease Sister    Arthritis Sister    Hyperlipidemia Sister    Obesity Sister    Macular degeneration Sister    Heart disease Brother        5 stents   Hyperlipidemia Brother    Macular degeneration Maternal  Grandfather    Cirrhosis Sister    Obesity Sister    Arthritis Sister    Heart disease Sister    Obesity Sister    Liver disease Other    Prostate cancer Other    Coronary artery disease Other    Colon cancer Neg Hx    Esophageal cancer Neg Hx    Rectal cancer Neg Hx    Stomach cancer Neg Hx     Social  History   Socioeconomic History   Marital status: Married    Spouse name: Romero   Number of children: 0   Years of education: Not on file   Highest education level: Associate degree: occupational, scientist, product/process development, or vocational program  Occupational History   Occupation: retired    Associate Professor: RETIRED  Tobacco Use   Smoking status: Former    Current packs/day: 0.00    Average packs/day: 0.5 packs/day for 50.0 years (25.0 ttl pk-yrs)    Types: Cigarettes    Start date: 05/02/1959    Quit date: 05/01/2009    Years since quitting: 15.3   Smokeless tobacco: Former    Types: Chew    Quit date: 1995   Tobacco comments:    Former smoker 01/27/22  Vaping Use   Vaping status: Never Used  Substance and Sexual Activity   Alcohol use: Yes    Alcohol/week: 2.0 - 3.0 standard drinks of alcohol    Types: 1 - 2 Glasses of wine, 1 Cans of beer per week    Comment: 1-2 glasses wine/beer daily   Drug use: Never   Sexual activity: Yes    Comment: lives with wife, no dietary restrictions  Other Topics Concern   Not on file  Social History Narrative   Lives with wife   Social Drivers of Health   Financial Resource Strain: Low Risk  (08/30/2024)   Overall Financial Resource Strain (CARDIA)    Difficulty of Paying Living Expenses: Not hard at all  Food Insecurity: No Food Insecurity (08/30/2024)   Hunger Vital Sign    Worried About Running Out of Food in the Last Year: Never true    Ran Out of Food in the Last Year: Never true  Transportation Needs: No Transportation Needs (08/30/2024)   PRAPARE - Administrator, Civil Service (Medical): No    Lack of Transportation  (Non-Medical): No  Physical Activity: Insufficiently Active (08/30/2024)   Exercise Vital Sign    Days of Exercise per Week: 2 days    Minutes of Exercise per Session: 20 min  Stress: No Stress Concern Present (08/30/2024)   [employer] of Occupational Health - Occupational Stress Questionnaire    Feeling of Stress: Only a little  Social Connections: Moderately Isolated (08/30/2024)   Social Connection and Isolation Panel    Frequency of Communication with Friends and Family: Twice a week    Frequency of Social Gatherings with Friends and Family: Twice a week    Attends Religious Services: Never    Database Administrator or Organizations: No    Attends Engineer, Structural: Not on file    Marital Status: Married  Catering Manager Violence: Not At Risk (03/23/2024)   Humiliation, Afraid, Rape, and Kick questionnaire    Fear of Current or Ex-Partner: No    Emotionally Abused: No    Physically Abused: No    Sexually Abused: No    Outpatient Medications Prior to Visit  Medication Sig Dispense Refill   acetaminophen  (TYLENOL ) 500 MG tablet Take 1,000 mg by mouth at bedtime.     aspirin  EC 81 MG EC tablet Take 1 tablet (81 mg total) by mouth daily. Swallow whole. 30 tablet 11   budesonide  (ENTOCORT EC ) 3 MG 24 hr capsule Take 2 capsules (6 mg total) by mouth daily. 180 capsule 3   cholecalciferol  (VITAMIN D3) 25 MCG (1000 UNIT) tablet Take 10,000 Units by mouth 2 (two) times a week. Monday and Friday  diazepam  (VALIUM ) 5 MG tablet TAKE 1/2 TO 2 TABLETS(2.5 TO 10 MG) BY MOUTH EVERY 12 HOURS AS NEEDED FOR ANXIETY 30 tablet 2   diphenoxylate -atropine  (LOMOTIL ) 2.5-0.025 MG tablet Take 1 tablet by mouth 4 (four) times daily as needed for diarrhea or loose stools.     ELIQUIS  5 MG TABS tablet TAKE 1 TABLET BY MOUTH TWICE  DAILY 180 tablet 3   Evolocumab  (REPATHA  SURECLICK) 140 MG/ML SOAJ Inject 140 mg into the skin every 14 (fourteen) days. 6 mL 1   furosemide  (LASIX ) 20 MG  tablet TAKE 1 TABLET BY MOUTH DAILY AS  NEEDED 90 tablet 3   latanoprost  (XALATAN ) 0.005 % ophthalmic solution Place 1 drop into the left eye at bedtime.      levalbuterol  (XOPENEX  HFA) 45 MCG/ACT inhaler Inhale 2 puffs into the lungs every 4 (four) hours as needed for wheezing or shortness of breath. 15 g 5   magnesium  oxide (MAG-OX) 400 (240 Mg) MG tablet Take 1 tablet (400 mg total) by mouth in the morning, at noon, and at bedtime. 270 tablet 1   metoprolol  tartrate (LOPRESSOR ) 100 MG tablet TAKE 1 TABLET BY MOUTH TWICE  DAILY 180 tablet 3   Multiple Vitamins-Minerals (PRESERVISION AREDS 2) CAPS Take 1 capsule by mouth daily.     nitroGLYCERIN  (NITROSTAT ) 0.4 MG SL tablet Place 1 tablet (0.4 mg total) under the tongue every 5 (five) minutes as needed for chest pain. 30 tablet 0   omeprazole  (PRILOSEC  OTC) 20 MG tablet Take 20 mg by mouth every morning.     Ranibizumab  (LUCENTIS  IO) Inject 1 Dose into the eye every 6 (six) weeks. Bilateral eye by Dr Craig Greven     sodium polystyrene (KAYEXALATE ) powder Take by mouth daily as needed (hyperkalemia). 454 g 1   tamsulosin  (FLOMAX ) 0.4 MG CAPS capsule Take 0.4 mg by mouth 2 (two) times daily.     tiZANidine  (ZANAFLEX ) 4 MG tablet Take 1 tablet (4 mg total) by mouth every 8 (eight) hours as needed for muscle spasms. 90 tablet 0   No facility-administered medications prior to visit.    Allergies  Allergen Reactions   Albumin  (Human) Anaphylaxis and Other (See Comments)   Doxycycline  Shortness Of Breath, Diarrhea and Palpitations    Chest pain, Fatigue and Diarrhea    Guaifenesin Anaphylaxis and Other (See Comments)    Stomach cramps   Polymyxin B-Trimethoprim Swelling    Eye drops made eyes swell   Pseudoephedrine Hives and Other (See Comments)    Stomach cramps   Codeine Hives, Itching, Other (See Comments) and Rash   Guaiacol Other (See Comments)    Hallucinations   Polymyxin B Other (See Comments), Rash and Swelling    Eye swelling    Statins Other (See Comments)    Muscle cramps  Muscle aches, Muscle cramps   Atorvastatin Other (See Comments)    Muscle aches and cramps  Other reaction(s): Other (See Comments)  Muscle Aches   Brimonidine  Tartrate Itching and Swelling   Gabapentin Other (See Comments)    Pt unsure of sensitivity  unk   Meloxicam Other (See Comments)    Pt unsure of sensitivity  Unknown reaction   Peppermint Flavoring Agent (Non-Screening) Other (See Comments)    Severe cramping   Pseudoephedrine-Guaifenesin Nausea And Vomiting    Stomach cramps   Pseudoephedrine-Guaifenesin Other (See Comments)    Stomach cramps   Rosuvastatin Calcium  Other (See Comments)    Muscle aches   Rosuvastatin Calcium  Other (See  Comments)    Muscle aches   Simvastatin Other (See Comments)    Muscle aches and cramps  Other reaction(s): Other (See Comments)   Tapentadol Other (See Comments)    Pt unsure of sensitivity  Unknown reaction   Tramadol  Nausea Only   Ciprofloxacin  Hives, Itching, Nausea Only, Rash and Other (See Comments)   Moxifloxacin Nausea Only and Other (See Comments)    Headaches, stomach cramps  Headaches, Stomach cramps   Rofecoxib Other (See Comments)    Stomach cramping    ROS See HPI    Objective:    General: No acute distress. Awake and conversant.  Eyes: Normal conjunctiva, anicteric. Round symmetric pupils.  ENT: Hearing grossly intact. No nasal discharge.  Neck: Neck is supple. No masses or thyromegaly.  Respiratory: CTAB. Respirations are non-labored. No wheezing.  Skin: Warm. No rashes or ulcers.  Psych: Alert and oriented. Cooperative, Appropriate mood and affect, Normal judgment.  CV: RRR. + murmur. No lower extremity edema.  MSK:  No clubbing or cyanosis. Ambulates with cane Neuro:  CN II-XII grossly normal.     BP (!) 144/82   Pulse 70   Ht 5' 4 (1.626 m)   Wt 175 lb 3.2 oz (79.5 kg)   SpO2 98%   BMI 30.07 kg/m  Wt Readings from Last 3 Encounters:   09/06/24 175 lb 3.2 oz (79.5 kg)  05/11/24 175 lb (79.4 kg)  05/10/24 175 lb (79.4 kg)       Assessment & Plan:   Problem List Items Addressed This Visit     Anemia   Asymptomatic.  Increase leafy greens, consider increased lean red meat and using cast iron cookware.         BPH with urinary obstruction   Stable on Flomax .  Follows with urology.        CHF (congestive heart failure) (HCC)   No recent exacerbation. Following with cardiology        COPD (chronic obstructive pulmonary disease) (HCC)   Stable. Denies recent exacerbations.      Essential hypertension - Primary   Blood pressure elevated at OV. - BP goal <130/80 - monitor and log blood pressures at home - check around the same time each day in a relaxed setting - Limit salt to <2000 mg/day - Follow DASH eating plan (heart healthy diet) - limit alcohol to 2 standard drinks per day for men and 1 per day for women - avoid tobacco products - get at least 2 hours of regular aerobic exercise weekly Patient aware of signs/symptoms requiring further/urgent evaluation.       GERD   Stable on omeprazole  20 mg twice daily.  Follows with GI.        Hyperkalemia   Recheck CMP      Relevant Orders   Comp Met (CMET)   Hyperlipidemia, mixed   Tolerating Repatha . Encourage heart healthy diet such as MIND or DASH diet, increase exercise, avoid trans fats, simple carbohydrates and processed foods, consider a krill or fish or flaxseed oil cap daily.        Lymphocytic colitis   On budesonide .  Follows with GI.        Persistent atrial fibrillation (HCC)   Rate controlled. Follows with cardiology       Prostate cancer in remission Prostate cancer treated with radiation. Follows with Urology - Continue monitoring PSA levels. -Experiencing urinary incontinence likely related to previous prostate cancer treatment. - Continue FU with urology, current management and monitor  symptoms.  FU 6 months  I am  having Tremel L. Levora Duos maintain his omeprazole , Ranibizumab  (LUCENTIS  IO), latanoprost , nitroGLYCERIN , tamsulosin , aspirin  EC, PreserVision AREDS 2, furosemide , cholecalciferol , acetaminophen , diazepam , levalbuterol , Eliquis , diphenoxylate -atropine , budesonide , tiZANidine , Repatha  SureClick, sodium polystyrene, metoprolol  tartrate, and magnesium  oxide.  No orders of the defined types were placed in this encounter.

## 2024-09-06 NOTE — Assessment & Plan Note (Signed)
 Rate controlled.  Follows with cardiology.

## 2024-09-06 NOTE — Assessment & Plan Note (Signed)
 Recheck CMP.

## 2024-09-06 NOTE — Assessment & Plan Note (Signed)
 No recent exacerbation. Following with cardiology

## 2024-09-06 NOTE — Assessment & Plan Note (Signed)
 Asymptomatic.  Increase leafy greens, consider increased lean red meat and using cast iron cookware.

## 2024-09-06 NOTE — Assessment & Plan Note (Signed)
Tolerating Repatha. Encourage heart healthy diet such as MIND or DASH diet, increase exercise, avoid trans fats, simple carbohydrates and processed foods, consider a krill or fish or flaxseed oil cap daily.   

## 2024-09-06 NOTE — Assessment & Plan Note (Addendum)
 Blood pressure elevated at OV. - BP goal <130/80 - monitor and log blood pressures at home - check around the same time each day in a relaxed setting - Limit salt to <2000 mg/day - Follow DASH eating plan (heart healthy diet) - limit alcohol to 2 standard drinks per day for men and 1 per day for women - avoid tobacco products - get at least 2 hours of regular aerobic exercise weekly Patient aware of signs/symptoms requiring further/urgent evaluation.

## 2024-09-06 NOTE — Assessment & Plan Note (Signed)
 On budesonide .  Follows with GI.

## 2024-09-06 NOTE — Assessment & Plan Note (Signed)
 Stable. Denies recent exacerbations.

## 2024-09-07 ENCOUNTER — Ambulatory Visit: Payer: Self-pay | Admitting: Student

## 2024-09-07 DIAGNOSIS — H3554 Dystrophies primarily involving the retinal pigment epithelium: Secondary | ICD-10-CM | POA: Diagnosis not present

## 2024-09-07 DIAGNOSIS — H35372 Puckering of macula, left eye: Secondary | ICD-10-CM | POA: Diagnosis not present

## 2024-09-07 DIAGNOSIS — H353231 Exudative age-related macular degeneration, bilateral, with active choroidal neovascularization: Secondary | ICD-10-CM | POA: Diagnosis not present

## 2024-09-07 LAB — COMPREHENSIVE METABOLIC PANEL WITH GFR
ALT: 10 U/L (ref 0–53)
AST: 17 U/L (ref 0–37)
Albumin: 4.3 g/dL (ref 3.5–5.2)
Alkaline Phosphatase: 77 U/L (ref 39–117)
BUN: 24 mg/dL — ABNORMAL HIGH (ref 6–23)
CO2: 25 meq/L (ref 19–32)
Calcium: 9.9 mg/dL (ref 8.4–10.5)
Chloride: 101 meq/L (ref 96–112)
Creatinine, Ser: 0.93 mg/dL (ref 0.40–1.50)
GFR: 79.12 mL/min (ref >60.00)
Glucose, Bld: 94 mg/dL (ref 70–99)
Potassium: 5.1 meq/L (ref 3.5–5.1)
Sodium: 136 meq/L (ref 135–145)
Total Bilirubin: 0.6 mg/dL (ref 0.2–1.2)
Total Protein: 7 g/dL (ref 6.0–8.3)

## 2024-09-27 ENCOUNTER — Emergency Department (HOSPITAL_COMMUNITY)

## 2024-09-27 ENCOUNTER — Emergency Department (HOSPITAL_COMMUNITY)
Admission: EM | Admit: 2024-09-27 | Discharge: 2024-09-27 | Disposition: A | Discharge status: Home / Self Care | Attending: Emergency Medicine | Admitting: Emergency Medicine

## 2024-09-27 ENCOUNTER — Other Ambulatory Visit: Payer: Self-pay

## 2024-09-27 DIAGNOSIS — I509 Heart failure, unspecified: Secondary | ICD-10-CM | POA: Diagnosis not present

## 2024-09-27 DIAGNOSIS — S51011A Laceration without foreign body of right elbow, initial encounter: Secondary | ICD-10-CM | POA: Insufficient documentation

## 2024-09-27 DIAGNOSIS — Z8546 Personal history of malignant neoplasm of prostate: Secondary | ICD-10-CM | POA: Insufficient documentation

## 2024-09-27 DIAGNOSIS — Z79899 Other long term (current) drug therapy: Secondary | ICD-10-CM | POA: Insufficient documentation

## 2024-09-27 DIAGNOSIS — I480 Paroxysmal atrial fibrillation: Secondary | ICD-10-CM | POA: Insufficient documentation

## 2024-09-27 DIAGNOSIS — I2510 Atherosclerotic heart disease of native coronary artery without angina pectoris: Secondary | ICD-10-CM | POA: Diagnosis not present

## 2024-09-27 DIAGNOSIS — Z7982 Long term (current) use of aspirin: Secondary | ICD-10-CM | POA: Insufficient documentation

## 2024-09-27 DIAGNOSIS — S20212A Contusion of left front wall of thorax, initial encounter: Secondary | ICD-10-CM | POA: Insufficient documentation

## 2024-09-27 DIAGNOSIS — Z7901 Long term (current) use of anticoagulants: Secondary | ICD-10-CM | POA: Insufficient documentation

## 2024-09-27 DIAGNOSIS — J449 Chronic obstructive pulmonary disease, unspecified: Secondary | ICD-10-CM | POA: Insufficient documentation

## 2024-09-27 DIAGNOSIS — N183 Chronic kidney disease, stage 3 unspecified: Secondary | ICD-10-CM | POA: Diagnosis not present

## 2024-09-27 DIAGNOSIS — S298XXA Other specified injuries of thorax, initial encounter: Secondary | ICD-10-CM

## 2024-09-27 DIAGNOSIS — Y92410 Unspecified street and highway as the place of occurrence of the external cause: Secondary | ICD-10-CM | POA: Diagnosis not present

## 2024-09-27 LAB — CBC WITH DIFFERENTIAL/PLATELET
Abs Immature Granulocytes: 0.19 K/uL — ABNORMAL HIGH (ref 0.00–0.07)
Basophils Absolute: 0 K/uL (ref 0.0–0.1)
Basophils Relative: 1 %
Eosinophils Absolute: 0.2 K/uL (ref 0.0–0.5)
Eosinophils Relative: 2 %
HCT: 34.2 % — ABNORMAL LOW (ref 39.0–52.0)
Hemoglobin: 11.2 g/dL — ABNORMAL LOW (ref 13.0–17.0)
Immature Granulocytes: 2 %
Lymphocytes Relative: 14 %
Lymphs Abs: 1.2 K/uL (ref 0.7–4.0)
MCH: 29.3 pg (ref 26.0–34.0)
MCHC: 32.7 g/dL (ref 30.0–36.0)
MCV: 89.5 fL (ref 80.0–100.0)
Monocytes Absolute: 0.7 K/uL (ref 0.1–1.0)
Monocytes Relative: 9 %
Neutro Abs: 6.1 K/uL (ref 1.7–7.7)
Neutrophils Relative %: 72 %
Platelets: 184 K/uL (ref 150–400)
RBC: 3.82 MIL/uL — ABNORMAL LOW (ref 4.22–5.81)
RDW: 14 % (ref 11.5–15.5)
WBC: 8.4 K/uL (ref 4.0–10.5)
nRBC: 0 % (ref 0.0–0.2)

## 2024-09-27 LAB — I-STAT CHEM 8, ED
BUN: 26 mg/dL — ABNORMAL HIGH (ref 8–23)
Calcium, Ion: 1.12 mmol/L — ABNORMAL LOW (ref 1.15–1.40)
Chloride: 101 mmol/L (ref 98–111)
Creatinine, Ser: 1.1 mg/dL (ref 0.61–1.24)
Glucose, Bld: 96 mg/dL (ref 70–99)
HCT: 33 % — ABNORMAL LOW (ref 39.0–52.0)
Hemoglobin: 11.2 g/dL — ABNORMAL LOW (ref 13.0–17.0)
Potassium: 4.6 mmol/L (ref 3.5–5.1)
Sodium: 134 mmol/L — ABNORMAL LOW (ref 135–145)
TCO2: 23 mmol/L (ref 22–32)

## 2024-09-27 MED ORDER — IOHEXOL 350 MG/ML SOLN
75.0000 mL | Freq: Once | INTRAVENOUS | Status: AC | PRN
  Administered 2024-09-27: 75 mL via INTRAVENOUS

## 2024-09-27 MED ORDER — ACETAMINOPHEN 500 MG PO TABS
1000.0000 mg | ORAL_TABLET | Freq: Once | ORAL | Status: AC
  Administered 2024-09-27: 1000 mg via ORAL
  Filled 2024-09-27: qty 2
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
## 2024-09-27 NOTE — ED Notes (Signed)
 Pt taken to CT with Trauma RN
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
## 2024-09-27 NOTE — Discharge Instructions (Addendum)
 The scans were normal today without any signs of broken bones.
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
## 2024-09-27 NOTE — ED Notes (Signed)
 Discharge paper work given and reviwed. Arm redressed per pt request. Pt is leaving with wife and friend in no new distress.
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
## 2024-10-02 DIAGNOSIS — Z09 Encounter for follow-up examination after completed treatment for conditions other than malignant neoplasm: Secondary | ICD-10-CM | POA: Insufficient documentation
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
## 2024-10-03 ENCOUNTER — Other Ambulatory Visit: Payer: Self-pay

## 2024-10-03 DIAGNOSIS — G959 Disease of spinal cord, unspecified: Secondary | ICD-10-CM

## 2024-10-06 ENCOUNTER — Ambulatory Visit: Admitting: Student

## 2024-10-06 ENCOUNTER — Encounter: Payer: Self-pay | Admitting: Student

## 2024-10-06 VITALS — BP 150/78 | HR 62 | Temp 98.0°F | Resp 14 | Ht 64.0 in | Wt 177.4 lb

## 2024-10-06 DIAGNOSIS — Z09 Encounter for follow-up examination after completed treatment for conditions other than malignant neoplasm: Secondary | ICD-10-CM | POA: Diagnosis not present

## 2024-10-06 DIAGNOSIS — R935 Abnormal findings on diagnostic imaging of other abdominal regions, including retroperitoneum: Secondary | ICD-10-CM

## 2024-10-12 ENCOUNTER — Other Ambulatory Visit: Payer: Self-pay | Admitting: Internal Medicine

## 2024-10-12 ENCOUNTER — Encounter: Payer: Self-pay | Admitting: Neurosurgery

## 2024-10-12 ENCOUNTER — Other Ambulatory Visit

## 2024-10-12 ENCOUNTER — Ambulatory Visit (HOSPITAL_BASED_OUTPATIENT_CLINIC_OR_DEPARTMENT_OTHER)
Admission: RE | Admit: 2024-10-12 | Discharge: 2024-10-12 | Disposition: A | Discharge status: Home / Self Care | Source: Ambulatory Visit | Attending: Neurosurgery | Admitting: Neurosurgery

## 2024-10-12 ENCOUNTER — Ambulatory Visit (INDEPENDENT_AMBULATORY_CARE_PROVIDER_SITE_OTHER): Admitting: Neurosurgery

## 2024-10-12 ENCOUNTER — Other Ambulatory Visit (HOSPITAL_BASED_OUTPATIENT_CLINIC_OR_DEPARTMENT_OTHER): Payer: Self-pay

## 2024-10-12 VITALS — BP 130/84 | Ht 64.0 in | Wt 173.0 lb

## 2024-10-12 DIAGNOSIS — I2510 Atherosclerotic heart disease of native coronary artery without angina pectoris: Secondary | ICD-10-CM

## 2024-10-12 DIAGNOSIS — I6300 Cerebral infarction due to thrombosis of unspecified precerebral artery: Secondary | ICD-10-CM

## 2024-10-12 DIAGNOSIS — Z981 Arthrodesis status: Secondary | ICD-10-CM

## 2024-10-12 DIAGNOSIS — G959 Disease of spinal cord, unspecified: Secondary | ICD-10-CM

## 2024-10-12 DIAGNOSIS — M7918 Myalgia, other site: Secondary | ICD-10-CM

## 2024-10-12 MED ORDER — REPATHA SURECLICK 140 MG/ML ~~LOC~~ SOAJ
140.0000 mg | SUBCUTANEOUS | 1 refills | Status: AC
  Filled 2024-10-12: qty 6, 84d supply, fill #0
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
## 2024-11-06 ENCOUNTER — Ambulatory Visit: Admitting: Gastroenterology

## 2024-12-20 ENCOUNTER — Ambulatory Visit: Admitting: Gastroenterology

## 2025-03-07 ENCOUNTER — Ambulatory Visit: Admitting: Student

## 2025-03-27 ENCOUNTER — Ambulatory Visit
